# Patient Record
Sex: Female | Born: 1945
Health system: Southern US, Community
[De-identification: ages and names within clinical notes are randomized; demographics above are authoritative.]

## PROBLEM LIST (undated history)

## (undated) DIAGNOSIS — R110 Nausea: Secondary | ICD-10-CM

## (undated) DIAGNOSIS — F32A Depression, unspecified: Secondary | ICD-10-CM

## (undated) DIAGNOSIS — D649 Anemia, unspecified: Secondary | ICD-10-CM

## (undated) DIAGNOSIS — J40 Bronchitis, not specified as acute or chronic: Secondary | ICD-10-CM

## (undated) DIAGNOSIS — E119 Type 2 diabetes mellitus without complications: Secondary | ICD-10-CM

## (undated) DIAGNOSIS — K635 Polyp of colon: Secondary | ICD-10-CM

## (undated) DIAGNOSIS — IMO0001 Reserved for inherently not codable concepts without codable children: Secondary | ICD-10-CM

## (undated) DIAGNOSIS — I2699 Other pulmonary embolism without acute cor pulmonale: Secondary | ICD-10-CM

## (undated) DIAGNOSIS — F329 Major depressive disorder, single episode, unspecified: Secondary | ICD-10-CM

## (undated) DIAGNOSIS — D689 Coagulation defect, unspecified: Secondary | ICD-10-CM

## (undated) DIAGNOSIS — M25561 Pain in right knee: Secondary | ICD-10-CM

## (undated) DIAGNOSIS — F419 Anxiety disorder, unspecified: Secondary | ICD-10-CM

## (undated) DIAGNOSIS — N200 Calculus of kidney: Secondary | ICD-10-CM

## (undated) DIAGNOSIS — J383 Other diseases of vocal cords: Secondary | ICD-10-CM

## (undated) DIAGNOSIS — G4730 Sleep apnea, unspecified: Secondary | ICD-10-CM

## (undated) DIAGNOSIS — G43909 Migraine, unspecified, not intractable, without status migrainosus: Secondary | ICD-10-CM

## (undated) DIAGNOSIS — R102 Pelvic and perineal pain: Secondary | ICD-10-CM

## (undated) DIAGNOSIS — E0590 Thyrotoxicosis, unspecified without thyrotoxic crisis or storm: Secondary | ICD-10-CM

## (undated) DIAGNOSIS — M1990 Unspecified osteoarthritis, unspecified site: Secondary | ICD-10-CM

## (undated) DIAGNOSIS — G709 Myoneural disorder, unspecified: Secondary | ICD-10-CM

## (undated) DIAGNOSIS — K219 Gastro-esophageal reflux disease without esophagitis: Secondary | ICD-10-CM

## (undated) DIAGNOSIS — E0500 Thyrotoxicosis with diffuse goiter without thyrotoxic crisis or storm: Secondary | ICD-10-CM

## (undated) HISTORY — PX: OTHER SURGICAL HISTORY: SHX169

## (undated) HISTORY — PX: APPENDECTOMY: SHX54

## (undated) HISTORY — PX: BOTOX INJECTION: SHX5754

## (undated) HISTORY — DX: Coagulation defect, unspecified: D68.9

## (undated) HISTORY — DX: Anemia, unspecified: D64.9

## (undated) HISTORY — PX: KIDNEY STONE SURGERY: SHX686

## (undated) HISTORY — PX: CHOLECYSTECTOMY: SHX55

## (undated) HISTORY — PX: ABDOMINAL HYSTERECTOMY: SHX81

## (undated) HISTORY — DX: Myoneural disorder, unspecified: G70.9

---

## 1968-09-17 HISTORY — PX: AUGMENTATION MAMMAPLASTY: SUR837

## 1996-09-17 HISTORY — PX: KNEE ARTHROSCOPY: SUR90

## 1997-12-22 ENCOUNTER — Inpatient Hospital Stay (HOSPITAL_COMMUNITY): Admission: AD | Admit: 1997-12-22 | Discharge: 1997-12-24 | Payer: Self-pay | Admitting: Family Medicine

## 1999-08-15 ENCOUNTER — Encounter: Admission: RE | Admit: 1999-08-15 | Discharge: 1999-11-13 | Payer: Self-pay | Admitting: Orthopedic Surgery

## 1999-12-25 ENCOUNTER — Encounter: Payer: Self-pay | Admitting: Family Medicine

## 1999-12-25 ENCOUNTER — Encounter: Admission: RE | Admit: 1999-12-25 | Discharge: 1999-12-25 | Payer: Self-pay | Admitting: Family Medicine

## 1999-12-27 ENCOUNTER — Encounter: Admission: RE | Admit: 1999-12-27 | Discharge: 1999-12-27 | Payer: Self-pay | Admitting: Family Medicine

## 1999-12-27 ENCOUNTER — Encounter: Payer: Self-pay | Admitting: Family Medicine

## 2000-02-22 ENCOUNTER — Encounter: Payer: Self-pay | Admitting: Neurology

## 2000-02-22 ENCOUNTER — Encounter: Admission: RE | Admit: 2000-02-22 | Discharge: 2000-02-22 | Payer: Self-pay | Admitting: Neurology

## 2001-06-30 ENCOUNTER — Inpatient Hospital Stay (HOSPITAL_COMMUNITY): Admission: EM | Admit: 2001-06-30 | Discharge: 2001-07-03 | Payer: Self-pay | Admitting: Emergency Medicine

## 2001-07-02 ENCOUNTER — Encounter: Payer: Self-pay | Admitting: Internal Medicine

## 2001-07-03 ENCOUNTER — Encounter: Payer: Self-pay | Admitting: Internal Medicine

## 2001-07-23 ENCOUNTER — Encounter: Admission: RE | Admit: 2001-07-23 | Discharge: 2001-07-23 | Payer: Self-pay | Admitting: Orthopedic Surgery

## 2001-07-23 ENCOUNTER — Encounter: Payer: Self-pay | Admitting: Orthopedic Surgery

## 2001-08-06 ENCOUNTER — Encounter: Admission: RE | Admit: 2001-08-06 | Discharge: 2001-08-06 | Payer: Self-pay | Admitting: Orthopedic Surgery

## 2001-08-06 ENCOUNTER — Encounter: Payer: Self-pay | Admitting: Orthopedic Surgery

## 2001-08-29 ENCOUNTER — Encounter: Payer: Self-pay | Admitting: Orthopedic Surgery

## 2001-08-29 ENCOUNTER — Encounter: Admission: RE | Admit: 2001-08-29 | Discharge: 2001-08-29 | Payer: Self-pay | Admitting: Orthopedic Surgery

## 2002-02-11 ENCOUNTER — Encounter: Payer: Self-pay | Admitting: Family Medicine

## 2002-02-11 ENCOUNTER — Encounter: Admission: RE | Admit: 2002-02-11 | Discharge: 2002-02-11 | Payer: Self-pay | Admitting: Family Medicine

## 2002-02-25 ENCOUNTER — Encounter: Payer: Self-pay | Admitting: Emergency Medicine

## 2002-02-25 ENCOUNTER — Emergency Department (HOSPITAL_COMMUNITY): Admission: EM | Admit: 2002-02-25 | Discharge: 2002-02-26 | Payer: Self-pay | Admitting: Emergency Medicine

## 2002-05-07 ENCOUNTER — Encounter: Payer: Self-pay | Admitting: Gastroenterology

## 2002-05-07 ENCOUNTER — Encounter: Admission: RE | Admit: 2002-05-07 | Discharge: 2002-05-07 | Payer: Self-pay | Admitting: Gastroenterology

## 2002-05-13 ENCOUNTER — Ambulatory Visit (HOSPITAL_COMMUNITY): Admission: RE | Admit: 2002-05-13 | Discharge: 2002-05-13 | Payer: Self-pay | Admitting: Gastroenterology

## 2002-06-11 ENCOUNTER — Encounter: Payer: Self-pay | Admitting: Orthopedic Surgery

## 2002-06-11 ENCOUNTER — Encounter: Admission: RE | Admit: 2002-06-11 | Discharge: 2002-06-11 | Payer: Self-pay | Admitting: Orthopedic Surgery

## 2002-06-25 ENCOUNTER — Encounter: Payer: Self-pay | Admitting: Orthopedic Surgery

## 2002-06-25 ENCOUNTER — Encounter: Admission: RE | Admit: 2002-06-25 | Discharge: 2002-06-25 | Payer: Self-pay | Admitting: Orthopedic Surgery

## 2002-06-29 ENCOUNTER — Encounter: Payer: Self-pay | Admitting: Surgery

## 2002-06-29 ENCOUNTER — Encounter (INDEPENDENT_AMBULATORY_CARE_PROVIDER_SITE_OTHER): Payer: Self-pay | Admitting: *Deleted

## 2002-06-29 ENCOUNTER — Inpatient Hospital Stay (HOSPITAL_COMMUNITY): Admission: RE | Admit: 2002-06-29 | Discharge: 2002-07-04 | Payer: Self-pay | Admitting: Surgery

## 2002-07-02 ENCOUNTER — Encounter: Payer: Self-pay | Admitting: Surgery

## 2002-07-03 ENCOUNTER — Encounter: Payer: Self-pay | Admitting: Thoracic Surgery

## 2002-07-09 ENCOUNTER — Encounter: Admission: RE | Admit: 2002-07-09 | Discharge: 2002-07-09 | Payer: Self-pay | Admitting: Orthopedic Surgery

## 2002-07-09 ENCOUNTER — Encounter: Payer: Self-pay | Admitting: Orthopedic Surgery

## 2002-07-10 ENCOUNTER — Encounter: Payer: Self-pay | Admitting: Thoracic Surgery

## 2002-07-10 ENCOUNTER — Encounter: Admission: RE | Admit: 2002-07-10 | Discharge: 2002-07-10 | Payer: Self-pay | Admitting: Thoracic Surgery

## 2002-09-17 HISTORY — PX: HAND SURGERY: SHX662

## 2003-04-08 ENCOUNTER — Ambulatory Visit (HOSPITAL_BASED_OUTPATIENT_CLINIC_OR_DEPARTMENT_OTHER): Admission: RE | Admit: 2003-04-08 | Discharge: 2003-04-08 | Payer: Self-pay | Admitting: Orthopedic Surgery

## 2003-09-16 ENCOUNTER — Encounter: Admission: RE | Admit: 2003-09-16 | Discharge: 2003-09-16 | Payer: Self-pay | Admitting: Orthopedic Surgery

## 2003-09-29 ENCOUNTER — Encounter: Admission: RE | Admit: 2003-09-29 | Discharge: 2003-09-29 | Payer: Self-pay | Admitting: Orthopedic Surgery

## 2003-10-21 ENCOUNTER — Encounter: Admission: RE | Admit: 2003-10-21 | Discharge: 2003-10-21 | Payer: Self-pay | Admitting: Orthopedic Surgery

## 2006-12-13 ENCOUNTER — Ambulatory Visit (HOSPITAL_COMMUNITY): Admission: RE | Admit: 2006-12-13 | Discharge: 2006-12-13 | Payer: Self-pay | Admitting: Family Medicine

## 2006-12-14 ENCOUNTER — Emergency Department (HOSPITAL_COMMUNITY): Admission: EM | Admit: 2006-12-14 | Discharge: 2006-12-14 | Payer: Self-pay | Admitting: Emergency Medicine

## 2006-12-16 ENCOUNTER — Inpatient Hospital Stay (HOSPITAL_COMMUNITY): Admission: RE | Admit: 2006-12-16 | Discharge: 2006-12-19 | Payer: Self-pay | Admitting: Urology

## 2007-01-30 ENCOUNTER — Inpatient Hospital Stay (HOSPITAL_COMMUNITY): Admission: EM | Admit: 2007-01-30 | Discharge: 2007-02-03 | Payer: Self-pay | Admitting: Emergency Medicine

## 2007-06-05 ENCOUNTER — Emergency Department (HOSPITAL_COMMUNITY): Admission: EM | Admit: 2007-06-05 | Discharge: 2007-06-05 | Payer: Self-pay | Admitting: Emergency Medicine

## 2010-04-06 ENCOUNTER — Encounter: Payer: Self-pay | Admitting: Internal Medicine

## 2010-04-10 ENCOUNTER — Encounter: Payer: Self-pay | Admitting: Internal Medicine

## 2010-04-19 ENCOUNTER — Encounter: Admission: RE | Admit: 2010-04-19 | Discharge: 2010-04-19 | Payer: Self-pay | Admitting: Internal Medicine

## 2010-04-27 DIAGNOSIS — G589 Mononeuropathy, unspecified: Secondary | ICD-10-CM | POA: Insufficient documentation

## 2010-04-27 DIAGNOSIS — E119 Type 2 diabetes mellitus without complications: Secondary | ICD-10-CM | POA: Insufficient documentation

## 2010-04-27 DIAGNOSIS — D649 Anemia, unspecified: Secondary | ICD-10-CM | POA: Insufficient documentation

## 2010-04-27 DIAGNOSIS — K8020 Calculus of gallbladder without cholecystitis without obstruction: Secondary | ICD-10-CM | POA: Insufficient documentation

## 2010-04-27 DIAGNOSIS — K649 Unspecified hemorrhoids: Secondary | ICD-10-CM | POA: Insufficient documentation

## 2010-04-27 DIAGNOSIS — E66813 Obesity, class 3: Secondary | ICD-10-CM

## 2010-04-27 DIAGNOSIS — G43709 Chronic migraine without aura, not intractable, without status migrainosus: Secondary | ICD-10-CM | POA: Insufficient documentation

## 2010-04-27 DIAGNOSIS — E1110 Type 2 diabetes mellitus with ketoacidosis without coma: Secondary | ICD-10-CM

## 2010-04-27 DIAGNOSIS — F341 Dysthymic disorder: Secondary | ICD-10-CM | POA: Insufficient documentation

## 2010-04-27 DIAGNOSIS — K5730 Diverticulosis of large intestine without perforation or abscess without bleeding: Secondary | ICD-10-CM | POA: Insufficient documentation

## 2010-04-27 DIAGNOSIS — K219 Gastro-esophageal reflux disease without esophagitis: Secondary | ICD-10-CM | POA: Insufficient documentation

## 2010-04-27 DIAGNOSIS — IMO0002 Reserved for concepts with insufficient information to code with codable children: Secondary | ICD-10-CM | POA: Insufficient documentation

## 2010-04-27 DIAGNOSIS — N200 Calculus of kidney: Secondary | ICD-10-CM | POA: Insufficient documentation

## 2010-04-27 DIAGNOSIS — F329 Major depressive disorder, single episode, unspecified: Secondary | ICD-10-CM | POA: Insufficient documentation

## 2010-04-27 DIAGNOSIS — K589 Irritable bowel syndrome without diarrhea: Secondary | ICD-10-CM | POA: Insufficient documentation

## 2010-04-27 HISTORY — DX: Morbid (severe) obesity due to excess calories: E66.01

## 2010-04-27 HISTORY — DX: Obesity, class 3: E66.813

## 2010-04-28 ENCOUNTER — Ambulatory Visit: Payer: Self-pay | Admitting: Internal Medicine

## 2010-04-28 DIAGNOSIS — R61 Generalized hyperhidrosis: Secondary | ICD-10-CM | POA: Insufficient documentation

## 2010-04-28 DIAGNOSIS — R0602 Shortness of breath: Secondary | ICD-10-CM | POA: Insufficient documentation

## 2010-04-28 DIAGNOSIS — R0902 Hypoxemia: Secondary | ICD-10-CM | POA: Insufficient documentation

## 2010-05-04 ENCOUNTER — Encounter: Payer: Self-pay | Admitting: Internal Medicine

## 2010-05-18 ENCOUNTER — Encounter: Payer: Self-pay | Admitting: Internal Medicine

## 2010-08-15 ENCOUNTER — Encounter: Admission: RE | Admit: 2010-08-15 | Discharge: 2010-08-15 | Payer: Self-pay | Admitting: Internal Medicine

## 2010-10-08 ENCOUNTER — Encounter: Payer: Self-pay | Admitting: Internal Medicine

## 2010-10-17 NOTE — Letter (Signed)
Summary: [facility]  [facility]   Imported By: Lester Colfax 05/26/2010 08:31:35  _____________________________________________________________________  External Attachment:    Type:   Image     Comment:   External Document

## 2010-10-17 NOTE — Procedures (Signed)
Summary: Colonoscopy / [facility].  Colonoscopy / [facility].   Imported By: Lennie Odor 04/24/2010 14:32:31  _____________________________________________________________________  External Attachment:    Type:   Image     Comment:   External Document

## 2010-10-17 NOTE — Letter (Signed)
Summary: [facility]  [facility]   Imported By: Lester Walton 05/04/2010 09:59:34  _____________________________________________________________________  External Attachment:    Type:   Image     Comment:   External Document

## 2010-10-17 NOTE — Procedures (Signed)
Summary: EGD / [facility]  EGD / [facility]   Imported By: Lennie Odor 05/03/2010 11:33:04  _____________________________________________________________________  External Attachment:    Type:   Image     Comment:   External Document

## 2010-10-17 NOTE — Assessment & Plan Note (Signed)
Summary: SATS 86 ON RA AT COLONOSCOPY//KP   Visit Type:  Initial Consult Copy to:  Dr Charna Elizabeth Primary Provider/Referring Provider:  DR Sonia Side MD  CC:  pulomnary consult, Pt states she has diffuclty breathing and her o2 sats would not stay above 85, Productive cough with green phlem, pt states she currently has uper respitory infection, and .  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 REferred by Dr Loreta Ave. Per patient pulse ox was low pre-endoscopy and colonoscopy on 04/19/2010. Nevertheless was still able to undergo moderate sedation procedure with O2. Same thing recurred when she went for back pain injections on 04/21/2010. She herself feels at baseline but recognizes she has dyspnea on exertion for over a year. She gets dyspneic walking around 300 feet. Dyspnea is probably stable since onset. Dyspnea also made worse by summer heat and humidity. Dyspnea is associated wheezing esp at night and is also related to weather changes or dust. Dyspnea and wheeze is relieved by oxygen, inhalers. Dspnea is also associated with periodic cough that varies with activity level and weather. OVerall she feels symptoms are rated as moderate - severe.   OF note, past 4 weeks she has had green sputum; currently on antibiotics for the same   Preventive Screening-Counseling & Management  Alcohol-Tobacco     Smoking Status: quit     Smoking Cessation Counseling: no     Smoke Cessation Stage: quit     Packs/Day: .5     Year Started: 1968     Year Quit: 1980     Pack years: 11     Passive Smoke Exposure: yes     Passive Smoke Counseling: not indicated; no passive smoke exposure  Comments: father smoked cigars and had copd. EXposed to father's cigars during teen years.   Allergies: 1)  ! Penicillin 2)  ! Darvocet 3)  ! Morphine 4)  ! Lodine  Past History:  Past Medical History: Last  updated: 04/27/2010 Current Problems:  GERD (ICD-530.81) IBS (ICD-564.1) OBESITY (ICD-278.00) NEUROPATHY (ICD-355.9) DEPRESSION (ICD-311) ANEMIA (ICD-285.9) CHOLELITHIASIS (ICD-574.20) DIABETES MELLITUS, TYPE II (ICD-250.00) MIGRAINE HEADACHE (ICD-346.90) RENAL CALCULUS (ICD-592.0) HEMORRHOIDS (ICD-455.6) DIVERTICULOSIS, COLON (ICD-562.10) ANXIETY DEPRESSION (ICD-300.4)      Past Surgical History: Last updated: 04/27/2010 kidney stones removed total vaginal hysterectomy laproscopic cholecystectomy zeuker diverticulectomy R knee surgery B hand surgeries wrist fractures  Risk Factors: Smoking Status: quit (04/28/2010) Packs/Day: .5 (04/28/2010) Passive Smoke Exposure: yes (04/28/2010)  Family History: father: deceased cirrhosis heart disease and COPD/emphysema sister: deceased sepsis mother: deceased MI dementia paternal grandfather: deceased heart disease paternal grandmother: deceased heart disease maternal grandfather: deceased heart disease maternal grandmother: deceased heart disease  Social History: former smoker. Quit in 1980. Started in 1968. 1/2ppd Married liveds with husband 5 children Occupation-- retired-- Special educational needs teacher Status:  quit Packs/Day:  .5 Pack years:  11 Passive Smoke Exposure:  yes  Review of Systems       The patient complains of shortness of breath with activity, shortness of breath at rest, productive cough, acid heartburn, loss of appetite, weight change, and abdominal pain.  The patient denies non-productive cough, coughing up blood, chest pain, irregular heartbeats, indigestion, difficulty swallowing, sore throat, tooth/dental problems, headaches, nasal congestion/difficulty breathing through nose, sneezing, itching, ear ache, anxiety, depression, hand/feet swelling, joint stiffness or pain, rash, change in color of mucus, and fever.    Vital Signs:  Patient profile:  65 year old female Height:      65 inches Weight:       239.8 pounds BMI:     40.05 O2 Sat:      90 % on Room air Temp:     98.4 degrees F oral Pulse rate:   98 / minute BP sitting:   118 / 82  (right arm) Cuff size:   regular  Vitals Entered By: Carver Fila (April 28, 2010 2:00 PM)  O2 Flow:  Room air  Serial Vital Signs/Assessments:  Comments: Ambulatory Pulse Oximetry  Resting; HR_90____    02 Sat_88%RA____  Lap1 (185 feet)   HR_109____   02 Sat__88%RA___ Lap2 (185 feet)   HR_114____   02 Sat__84%RA___    Lap3 (185 feet)   HR_____   02 Sat_____  ___Test Completed without Difficulty _x__Test Stopped due to: PT DESAT ON THE SECOND LAP TO 84% RA and heart rate 114, THEN TOOK PT TO ROOM AND PUT ON 2 LITERS OF OXYGEN AND WENT UP TO 92% WITH HEART RATE 108 with 1 minute Mindy Silva  April 28, 2010 2:55 PM     By: Carver Fila   CC: pulomnary consult, Pt states she has diffuclty breathing and her o2 sats would not stay above 85, Productive cough with green phlem, pt states she currently has uper respitory infection,  Is Patient Diabetic? No Comments meds and alergies updated Phone number updated Carver Fila  April 28, 2010 3:04 PM    Physical Exam  General:  obese.   Head:  normocephalic and atraumatic Eyes:  PERRLA/EOM intact; conjunctiva and sclera clear Ears:  TMs intact and clear with normal canals Nose:  no deformity, discharge, inflammation, or lesions Mouth:  no deformity or lesions Neck:  no masses, thyromegaly, or abnormal cervical nodes  scar in left neck from prior Zencker diveritculum surgery Chest Wall:  no deformities noted Lungs:  clear bilaterally to auscultation and percussion Heart:  regular rate and rhythm, S1, S2 without murmurs, rubs, gallops, or clicks Abdomen:  bowel sounds positive; abdomen soft and non-tender without masses, or organomegaly Msk:  no deformity or scoliosis noted with normal posture Pulses:  pulses normal Extremities:  no clubbing, cyanosis, edema, or deformity  noted Neurologic:  CN II-XII grossly intact with normal reflexes, coordination, muscle strength and tone Skin:  intact without lesions or rashes Cervical Nodes:  no significant adenopathy Axillary Nodes:  no significant adenopathy Psych:  alert and cooperative; normal mood and affect; normal attention span and concentration   CXR  Procedure date:  12/18/2006  Findings:         Clinical Data:   Productive cough and green sputum, evaluate for   pneumonia.   CHEST - 2 VIEW:   Comparison:  None.   Findings:  The heart size is normal.  No effusions or edema.  No   airspace opacities noted.   IMPRESSION:   No active disease.     Read By:  Rosealee Albee,  M.D.   Released By:  Rosealee Albee,  M.D.   Comments:      independently reviewed  CT of Abdomen  Procedure date:  01/30/2007  Findings:      lung cuts of this ct abdomen does not show evidence of emphysema  Impression & Recommendations:  Problem # 1:  DYSPNEA (ICD-786.05) Assessment New Unclear cause. Dyspnea is related to exrtional hypoxemia. DDx includes COPD (fam hx + but only limited smoke exposure), pulm htn.  PLAN Full  PFTs and then decide next step  Orders: Pulmonary Referral (Pulmonary) Consultation Level IV (16109) DME Referral (DME) Pulmonary Referral (Pulmonary)  Problem # 2:  HYPOXEMIA (ICD-799.02) Assessment: New she has exertional hypoxemia. Unclear cause. She needs O2. I explained this to her. She was shocked and in denial. Explained rationale from improved quality of life, syncope, and possible mortalit benefit too. She is willing to accept it Orders: Pulmonary Referral (Pulmonary) Consultation Level IV (60454) DME Referral (DME) Pulmonary Referral (Pulmonary)  Problem # 3:  DIAPHORESIS (ICD-780.8) Assessment: New as she was ready to leave office, she c/o diaphoresis x 1 day following dm and bp med change by pmd. WE checked fingerstickl and it was 104mg %. I have asked her to address  this wiht her pmd  Medications Added to Medication List This Visit: 1)  Cymbalta 30 Mg Cpep (Duloxetine hcl) .... One tab twice daily 2)  Tramadol Hcl 50 Mg Tabs (Tramadol hcl) .... As needed q 4 hrs 3)  Neurontin 300 Mg Caps (Gabapentin) .... 2 in the am 4 in pm 4)  Clonazepam 2 Mg Tabs (Clonazepam) .... One tab once a day 5)  Clonazepam 0.5 Mg Tabs (Clonazepam) .[name].. 1 tablet at bedtime 6)  Metformin Hcl 500 Mg Tabs (Metformin hcl) .... Once at bedtime 7)  Chromium Picolinate 200 Mcg Tabs (Chromium picolinate) .... 5 caps in the morning 8)  Omeprazole 20 Mg Cpdr (Omeprazole) .[name].. 1-2 per day 9)  Ultra Flora Plus Df Caps Probiotic  .[name].. 1 at bedtime 10)  Integra F 125-1 Mg Caps (Fe fum-fepoly-fa-vit c-vit b3) .... 3 times a day 11)  Ferrous Sulfate 324 Mg Tbec (Ferrous sulfate) .[name].. 1 tab 3 times a day 12)  Tessalon Perles 100 Mg Caps (Benzonatate) .... Every 8 hrs as needed 13)  Imitrex 100 Mg Tabs (Sumatriptan succinate) .... As needed 14)  Treximet 85-500 Mg Tabs (Sumatriptan-naproxen sodium) .... Max 2 tabs per day 15)  Promethazine Hcl 25 Mg Tabs (Promethazine hcl) .[name].. 1 tablet every 4-6 hrs  Patient Instructions: 1)  please wear oxygen with sleep and exertion 2)  have full PFT breathing test 3)  I will review that result and call you with next step 4)  Please tallk to Dr. Ricki Miller your primary care doctor about your sweating issues   Immunization History:  Pneumovax Immunization History:    Pneumovax:  historical (04/10/2010)

## 2010-10-17 NOTE — Letter (Signed)
Summary: CMN for Oxygen / [facility]  CMN for [facility]xygen / [facility]   Imported By: Lennie Odor 06/08/2010 11:25:17  _____________________________________________________________________  External Attachment:    Type:   Image     Comment:   External Document

## 2011-01-17 ENCOUNTER — Other Ambulatory Visit: Payer: Self-pay | Admitting: Internal Medicine

## 2011-01-17 DIAGNOSIS — E041 Nontoxic single thyroid nodule: Secondary | ICD-10-CM

## 2011-01-18 ENCOUNTER — Ambulatory Visit
Admission: RE | Admit: 2011-01-18 | Discharge: 2011-01-18 | Disposition: A | Discharge status: Home / Self Care | Payer: Medicare Other | Source: Ambulatory Visit | Attending: Internal Medicine | Admitting: Internal Medicine

## 2011-01-18 DIAGNOSIS — E041 Nontoxic single thyroid nodule: Secondary | ICD-10-CM

## 2011-01-30 NOTE — Discharge Summary (Signed)
Hayley [name], Hayley [name]                  ACCOUNT NO.:  [id number]   MEDICAL RECORD NO.:  [id number]          PATIENT TYPE:  INP   LOCATION:  1433                         FACILITY:  [facility]   PHYSICIAN:  Mobolaji B. Corky Downs, M.D.DATE OF BIRTH:  Aug 27, 1946   DATE OF ADMISSION:  01/30/2007  DATE OF DISCHARGE:                               DISCHARGE SUMMARY   PRIMARY CARE PHYSICIAN:  Dr. Evelena Peat   GASTROENTEROLOGIST:  Dr. Anselmo Rod   FINAL DIAGNOSIS:  Acute sigmoid diverticulitis.   SECONDARY DIAGNOSIS:  Migraine headaches.   PROCEDURES:  CT abdomen and pelvis, findings most compatible with distal  sigmoid diverticulitis.   BRIEF HISTORY:  Hayley [name] is a 65 year old Caucasian female who presented  to the emergency room with abdominal pain that started suddenly in the  left lower quadrant a day prior to hospitalization, associated with  nausea and multiple vomiting.  Pain was crampy in nature and rated as  7/10.  She had no fever or chills.  Initial laboratory data revealed  white cell count of 10.1.  CT scan of the abdomen and pelvis was  compatible with distal sigmoid diverticulitis without abscess.  Of note  is that the patient had diverticulitis over 10 years ago.   HOSPITAL COURSE:  The patient was kept on clear liquids.  She was  started on IV Cipro and Flagyl.  She had low-grade fever of 99.1 on  admission.  White cell count improved during the course of  hospitalization.  The patient's pain also improved.  Her diet was  advanced and she was tolerating her diet.  Overall, the patient has  improved and was deemed suitable for discharge today.   VITALS SIGNS UPON DISCHARGE:  Temperature 98.5, pulse of 75, blood  pressure 105/68, O2 saturations of 92% on room air.  She had no  abdominal tenderness.   DISCHARGE MEDICATIONS:  1. Ciprofloxacin 500 mg p.o. b.i.d. for six more days.  2. Flagyl 500 mg t.i.d. for six more days.  3. Vicodin one to two p.o. q.4h. p.r.n.  4.  Effexor 150 mg twice a day.  5. Neurontin 600 mg daily.   FOLLOWUP:  1. With Dr. Caryl Never in 1-2 weeks.  2. With Dr. Charna Elizabeth in 3-4 weeks.   DISCHARGE LABORATORY DATA:  Sodium 137, potassium 3.7, chloride 107,  bicarb 27, glucose 106, BUN 3, creatinine 0.58, calcium 8.3.  White  cells 4.1, hemoglobin 12.4, hematocrit 36.8, platelets 255.      Mobolaji B. Corky Downs, M.D.  Electronically Signed     MBB/MEDQ  D:  02/03/2007  T:  02/03/2007  Job:  [id number]   cc:   Evelena Peat, M.D.   Anselmo Rod, M.D.  Fax: [phone]

## 2011-01-30 NOTE — H&P (Signed)
Hayley [name], Hayley [name]                  ACCOUNT NO.:  [id number]   MEDICAL RECORD NO.:  [id number]          PATIENT TYPE:  EMS   LOCATION:  ED                           FACILITY:  [facility]   PHYSICIAN:  Madaline Savage, MD        DATE OF BIRTH:  02/25/1946   DATE OF ADMISSION:  01/30/2007  DATE OF DISCHARGE:                              HISTORY & PHYSICAL   PRIMARY CARE PHYSICIAN:  Evelena Peat, M.D.   PRIMARY GASTROENTEROLOGIST:  Anselmo Rod, M.D.   CHIEF COMPLAINT:  Abdominal pain.   HISTORY OF PRESENT ILLNESS:  Ms. Hayley [name] is a 65 year old Caucasian lady  with a history of diverticulitis approximately 10 years ago who comes in  with abdominal pain in the left lower quadrant for 1 day.  She says it  came on suddenly with cramping, nausea, and she vomited multiple times.  Her pain is constant in nature.  She describes it like a cramping pain.  Right now her pain is 7/10 but she says the pain goes up to 9/10.  The  pain does not radiate.  She does complain of feeling feverish and  chilly.  She also states that she has been passing blood-tinged mucous.  She also now complains of headache and she has a history of migraine  headaches.   PAST MEDICAL HISTORY:  1. Depression.  2. Migraine headaches.  3. Diverticulitis approximately 10 years ago.   PAST SURGICAL HISTORY:  1. Neck surgery in the past.  2. Right knee surgery.   ALLERGIES:  MORPHINE, DARVOCET, PENICILLIN, LODINE.   CURRENT MEDICATIONS:  1. Effexor XR 150 mg twice daily.  2. Imitrex 25 mg as needed.   SOCIAL HISTORY:  She denies any history of smoking, alcohol, or drug  abuse.   FAMILY HISTORY:  Her father passed away at the age of 23 of heart  problems and mother passed away at the age of 81 of stroke.  She also  had coronary artery disease.   REVIEW OF SYSTEMS:  GENERAL:  She denies any recent weight loss or  weight gain.  She does complain of fever and chills.  HEENT:  No  headaches, no blurred vision, and no  sore throat.  CARDIOVASCULAR:  She  denies chest pain, palpitations.  RESPIRATORY:  No shortness of breath,  cough.  GASTROINTESTINAL:  She does complain of nausea, vomiting, and  abdominal pain.   PHYSICAL EXAMINATION:  GENERAL:  She is alert and oriented.  VITAL SIGNS:  Temperature is 99.1, pulse rate 96, blood pressure 100/67,  respiratory rate 20, oxygen saturation of 93% on room air.  HEENT:  Normocephalic and atraumatic.  Pupils equal, round, and reactive  to light.  NECK:  Supple, no JVD and no carotid bruit.  HEART:  S1 and S2 heard, regular rate and rhythm.  No murmurs, rubs, or  gallops.  CHEST:  Clear to auscultation.  ABDOMEN:  Soft, bowel sounds heard. She does have tenderness in the left  lower quadrant.  There is no rebound tenderness.  EXTREMITIES:  No cyanosis, clubbing, or edema.  LABORATORY DATA:  White count 10.1, hemoglobin 13.8, hematocrit 41.2,  platelets 312.  Sodium 141, potassium 3.6, creatinine 0.65.  Her liver  enzymes are within normal limits.  Lipase is 19.  Urinalysis is  negative.   She had a CT scan of the abdomen which showed distal sigmoid  diverticulitis and no abscess.   IMPRESSION:  1. Acute sigmoid diverticulitis.  2. Migraine headaches.  3. Depression.   PLAN:  This is a 65 year old lady who has a history of diverticuli in  the past and a history of diverticuli approximately 10 years ago who  comes in with acute sigmoid diverticulitis.  She has been passing blood  and mucous for the last 1 day.  She will need to be on antibiotics.  At  this time she has nausea and cannot keep anything down.  We will admit  her for IV antibiotics, will start her on Cipro and Flagyl.  We can  consult her primary gastroenterologist if needed.  At this time, she is  also complaining of headache which is a migraine headache.  I will give  her Imitrex at this time.  I will put her on DVT and GI prophylaxis.      Madaline Savage, MD  Electronically  Signed     PKN/MEDQ  D:  01/30/2007  T:  01/30/2007  Job:  [id number]

## 2011-02-02 NOTE — Discharge Summary (Signed)
Hayley [name], Hayley [name]                  ACCOUNT NO.:  [id number]   MEDICAL RECORD NO.:  [id number]          PATIENT TYPE:  INP   LOCATION:  1444                         FACILITY:  [facility]   PHYSICIAN:  Maretta Bees. Vonita Moss, M.D.DATE OF BIRTH:  March 09, 1946   DATE OF ADMISSION:  12/16/2006  DATE OF DISCHARGE:  12/19/2006                               DISCHARGE SUMMARY   FINAL DIAGNOSES:  1. Distal right ureteral calculus.  2. Urinary tract infection.  3. Depression.  4. Arthritis and chronic back pain.  5. Cough and chest congestion.  6. Constipation.  7. Migraine headaches.   HISTORY:  This 65 year old lady presented to my office on December 16, 2006  with 3-day history of severe right flank pain and had been in the  emergency room and was found to have a 4.3-mm stone in the distal right  ureter.  She also was found to have a urinary tract infection and was on  Macrobid.  She requested removal of the stone, which was done at the  [facility]; however, afterwards, the patient felt she  could not manage at home the way she felt and with her husband's many  medical problems and his inability to care for her.  Also, it was  appropriate to keep her in view of her UTI.  At the time of surgery, she  had a double-J catheter placed.   PHYSICAL EXAMINATION:  Physical examination revealed a heavy-set white  female initially with severe distress from flank pain; however, this was  greatly relieved after her cystoscopy and stone removal and double-J  catheter placement.   HOSPITAL COURSE:  Her hospital stay was prolonged by complaints of  grossly bloody urine, frequent urination, chest congestion, fatigue and  constipation.  During the course of her hospital stay, she had her  double-J catheter removed and her bloody urine cleared up and her  frequency improved.  She required enemas and laxatives to clear her  constipation.  She had unusual complaints of cough and chest congestion.  Chest x-ray on December 18, 2006 showed no evidence of pneumonia or  pulmonary congestion.  She was put on expectorants.  She was feeling  improved on the day of discharge.   DISCHARGE MEDICATIONS:  1. She will go home on her usual dose of Effexor 150 mg p.o. b.i.d.  2. She can use Ambien 10 mg at bedtime.  3. She will buy an over-the-counter expectorant.  4. She can finish up her Macrobid b.i.d.  5. She can use oxycodone p.r.n. pain and use her Imitrex as needed for      her migraine headaches.  6. She will return to see me in 2 weeks in the office.   CONDITION ON DISCHARGE:  She was sent home in improved condition.   DIET AND ACTIVITY:  She can resume diet and activity as tolerated.   DISCHARGE INSTRUCTIONS:  If she has persistent symptoms of cough or  other non-urologic problems, she was instructed to contact Dr. Evelena Peat or Dr. Anselmo Rod.      Maretta Bees.  Vonita Moss, M.D.  Electronically Signed     LJP/MEDQ  D:  12/19/2006  T:  12/19/2006  Job:  [id number]

## 2011-02-02 NOTE — Op Note (Signed)
   NAME:  Hayley [name], BOUTIN                            ACCOUNT NO.:  [id number]   MEDICAL RECORD NO.:  [id number]                   PATIENT TYPE:  AMB   LOCATION:  DSC                                  FACILITY:  [facility]   PHYSICIAN:  Rodney A. Chaney Malling, M.D.           DATE OF BIRTH:  1946/04/16   DATE OF PROCEDURE:  04/08/2003  DATE OF DISCHARGE:                                 OPERATIVE REPORT   PREOPERATIVE DIAGNOSIS:  Right carpal tunnel.   POSTOPERATIVE DIAGNOSIS:  Right carpal tunnel.   OPERATION:  Release of carpal tunnel, right wrist.   SURGEON:  Rodney A. Chaney Malling, M.D.   ANESTHESIA:  MAC.   PROCEDURE:  Patient placed on the operating table in supine position with a  pneumatic tourniquet about the right upper arm.  The right upper extremity  was prepped with Duraprep and draped out in the usual manner.  The area of  incision was infiltrated with 1% local Xylocaine.  The hand was wrapped out  with an Esmarch.  The tourniquet was elevated.  Using loupe magnification, a  lazy S incision starting at the midpalmar space and carried proximally to  the volar wrist crease.  Skin edges were retracted.  The fascia in the  forearm was opened and the median nerve was identified and isolated.  The  curved Mayo scissors were placed underneath the transverse carpal ligament  but above the median nerve to protect the median nerve.  Under direct vision  the transverse carpal ligament was released off the ulnar border of the  carpal canal out into the midpalmar space.  A complete decompression of the  nerve was achieved very nicely.  There was definite loss of the vascular  markings underneath the transverse ligament and some narrowing of the nerve  at this level.  No other space-occupying lesions were seen.  The synovial  sheaths to the tendon appeared normal.  The wound was then bathed in  Marcaine and the skin edges were closed with 4-0 nylon suture.  A large  bulky compressive dressing  applied.  The patient returned to the recovery  room in excellent condition.  Technically this procedure went extremely  well.   FOLLOW-UP:  1. See me in the office next week.  2. Wrist immobilizer.  3. Percocet for pain.                                               Rodney A. Chaney Malling, M.D.    RAM/MEDQ  D:  04/08/2003  T:  04/08/2003  Job:  [id number]

## 2011-02-02 NOTE — H&P (Signed)
NAME:  Hayley [name], Hayley [name]                            ACCOUNT NO.:  [id number]   MEDICAL RECORD NO.:  [id number]                   PATIENT TYPE:  OIB   LOCATION:  NA                                   FACILITY:  [facility]   PHYSICIAN:  Velora Heckler, M.D.                DATE OF BIRTH:  1945-09-23   DATE OF ADMISSION:  06/29/2002  DATE OF DISCHARGE:                                HISTORY & PHYSICAL   ATTENDING SURGEON:  Ines Bloomer, MD   PRIMARY CARE GIVER:  Evelena Peat, MD   CARDIOLOGIST:  Peter M. Swaziland, M.D.   OTHER PHYSICIANS:  1. Anselmo Rod, M.D.  2. Velora Heckler, M.D.  3. Orie Rout, M.D.   HISTORY OF PRESENT ILLNESS:  Ms. Allport is a 65 year old female with a  complaint of voice changes and persistent reflux.  Her work-up included a  barium swallow.  The study demonstrated a finding of a 3-4 cm Zenker's  diverticulum.  Possibly aspiration from this diverticulum is causing reflux.  She was also complaining of chronic right lower quadrant pain and nausea and  is set at this time for a concomitant laparoscopic cholecystectomy.   PAST MEDICAL HISTORY:  1. Gastroesophageal reflux disease.  2. Depression/anxiety.  3. Migraine headaches.  4. Degenerative joint disease.  5. Dystonia.  6. Very strong family history of atherosclerotic coronary artery disease.  7. She fell with fracture of the right knee in November 2000.   SURGERIES:  1. Status post partial vaginal hysterectomy in July 1998.  2. Status post right knee surgery, November 2000.  3. Status post left carpal tunnel release.  4. Status post Botox injections in the larynx.  5. Status post colonoscopy, revealing diverticula, August 2003.  6. Status post Cardiolite stress test which was apparently normal for     ischemic changes in July 2003.   MEDICATIONS:  1. Effexor 150 mg in the morning, 75 mg in the evening (this is her baseline     medication for migraine prophylaxis.  2. Nexium 40 mg q.d.  3.  Flexeril 10 mg as needed.  4. Imitrex 100 mg as needed for migraine (this is taken at migraine onset).  5. Bextra 20 mg as needed (this is taken also on migraine onset).  6. Ultram 50 mg 1-2 tabs p.o. q.4-6h. p.r.n. pain.  7. Talwin NX 50/0.5, 1 tab q.3-4h. (this is taken if the migraine headache     is intractable, and both Imitrex and Bextra have not proven to work).   ALLERGIES:  MORPHINE SULFATE causes hallucinations.  LODINE, PENICILLIN  which cause rash, and DARVOCET nausea.   The patient denies any prior history of diabetes mellitus, kidney disease,  asthma, chronic obstructive pulmonary disease, TIA, CVA.  She has had  episodes of syncope, one of which occasion to Cardiolite stress test work-  up.  She has not had  any symptoms of amaurosis fugax and although she has a  strong family history of coronary artery disease, she herself is free of  symptoms as well as findings.  She has no history of angina nor cardiac  dysrhythmias.  No prior history of myocardial infarction, no pulmonary  embolus, no deep venous thrombosis, no history of GI bleed.  She is not  short of breath with exertion.  She does not have paroxysmal nocturnal  dyspnea.   SOCIAL HISTORY:  She is married, has a supportive spouse.  She has five  children, all of whom are well.  She is currently not working, but she did  work in Engineering geologist.  She does not take alcoholic beverages, and she does not  smoke.  She quit in 1981 after smoking for only five years.   FAMILY HISTORY:  Significant for coronary artery disease in all four  grandparents, in the mother, as well as a sister who died at age 30 of a  myocardial infarction.  She has another sister who has diabetes/arthritis.   PHYSICAL EXAMINATION:  GENERAL:  This is an alert, oriented female, quite  personable and chatty and in no acute distress.  VITAL SIGNS:  Blood pressure 114/80 in the left arm; pulse is 100 and  regular, and respirations are 18 and even.  HEENT:   Pupils equal, round, reactive to light.  Extraocular movements are  intact.  The oropharynx is clear of lesions.  She does not wear dentures.  She does have history of vocal dystonia which has been treated with Botox  injections.  NECK:  Supple, no jugular venous distension.  No carotid bruits auscultated.  No thyromegaly.  LUNGS:  Clear to auscultation and percussion bilaterally.  HEART:  Regular rate and rhythm without murmur, rubs, or gallops.  ABDOMEN:  Obese, nontender, nondistended.  Bowel sounds are present  throughout.  She does not have any particular right upper or lower quadrant  pain on palpation.  EXTREMITIES:  Without cyanosis, clubbing, edema, ulcerations, and she has  palpable pedal pulses.  She has no symptoms of claudication.  NEUROLOGIC:  Grossly normal.  Gait is normal.  Grip strength is 5/5  bilaterally.   IMPRESSION:  1. Zenker's diverticulum, 3-4 cm.  2. Gastroesophageal reflux disease.  3. Cholecystitis.   PLAN:  1. Repair of diverticulum.  2. Cricopharyngeal myotomy and concomitant laparoscopic cholecystectomy to     be done June 29, 2002, in conjunction with Dr. Gerrit Friends.     Maple Mirza, P.A.                    Velora Heckler, M.D.   GM/MEDQ  D:  06/25/2002  T:  06/28/2002  Job:  [id number]

## 2011-02-02 NOTE — Op Note (Signed)
NAMEEPIFANIA, Hayley [name]                  ACCOUNT NO.:  [id number]   MEDICAL RECORD NO.:  [id number]          PATIENT TYPE:  AMB   LOCATION:  [facility]                         FACILITY:  [facility]   PHYSICIAN:  Maretta Bees. Vonita Moss, M.D.DATE OF BIRTH:  [birth year]   DATE OF PROCEDURE:  DATE OF DISCHARGE:                               OPERATIVE REPORT   PREOPERATIVE DIAGNOSIS:  Distal right ureteral calculus.   POSTOPERATIVE DIAGNOSIS:  Distal right ureteral calculus.   PROCEDURE:  1. Cystoscopy.  2. Right retrograde pyelogram with interpretation.  3. Right ureteroscopy and stone basketing and insertion of right      double-J catheter.   SURGEON:  Maretta Bees. Vonita Moss, MD.   ANESTHESIA:  General.   INDICATIONS:  This lady has had three to four days of severe right flank  pain and also is being treated for a UTI, and she presented to the  office today for evaluation.  She opted for a cysto and stone removal  today because of her significant symptoms and her underlying infection,  and the fact that she had a sister die of urinary tract sepsis.  She was  advised about the risks of procedure.   PROCEDURE:  The patient was brought to the operating room and placed in  the lithotomy position after the induction of satisfactory spinal  anesthesia.  The external genitalia were prepped and draped in the usual  fashion.  She was cystoscoped, and the bladder was unremarkable.  A  guidewire was placed up the right ureter under fluoroscopic control, and  I saw the stone next to the guidewire.  I then dilated the right  intramural ureter with the inner sheath of a ureteral access sheath.  I  then inserted a 6-French rigid ureteroscope atraumatically and retrieved  the stone with the Nitinol stone basket and gave the stone to the  patient's husband after the case was over.  There was no ureteral  trauma.   A right retrograde pyelogram was obtained using contrast injected  through an open-ended ureteral catheter  placed cystoscopically, and she  had mild fullness of the right renal pelvis, but no filling defects or  other abnormalities.   I then measured the ureteral length and inserted a 6-French, 26 cm,  double-J contour catheter that had a full coil in the kidney and a full  coil in the bladder, and the string was brought out per urethra and  taped to the thigh postoperatively.  She was taken to recovery room in  good condition having tolerated the procedure well with essentially no  blood loss, and she was in good condition.      Maretta Bees. Vonita Moss, M.D.  Electronically Signed     LJP/MEDQ  D:  12/16/2006  T:  12/16/2006  Job:  [id number]

## 2011-02-02 NOTE — Op Note (Signed)
NAME:  Hayley [name], Hayley [name]                            ACCOUNT NO.:  [id number]   MEDICAL RECORD NO.:  [id number]                   PATIENT TYPE:  OIB   LOCATION:  2871                                 FACILITY:  [facility]   PHYSICIAN:  Velora Heckler, M.D.                DATE OF BIRTH:  [birth year]   DATE OF PROCEDURE:  06/29/2002  DATE OF DISCHARGE:                                 OPERATIVE REPORT   PREOPERATIVE DIAGNOSIS:  Chronic cholecystitis.   POSTOPERATIVE DIAGNOSIS:  Chronic cholecystitis.   OPERATION/PROCEDURE:  Laparoscopic cholecystectomy with intraoperative  cholangiography.   ASSISTANT:  Gita Kudo, M.D.   ANESTHESIA:  General.   ESTIMATED BLOOD LOSS:  Minimal.   PREPARATION OF THE PATIENT:  Betadine.   COMPLICATIONS:  None.   INDICATIONS FOR PROCEDURE:  The patient is a 65 year old white female  presents at the request of Dr. Anselmo Rod for evaluation of right upper  quadrant abdominal pain and nausea.  This has been present for approximately  six months.  Extensive work up included gallbladder ultrasound.  The patient  underwent pH probe.  She underwent upper GI series.  She was identified with  a large Zenker's diverticulum of the cervical esophagus.  The patient  underwent a hepatobiliary scan.  The patient now comes to surgery for  cholecystectomy for treatment of biliary colic and right upper quadrant  abdominal pain and concurrent Zenker's diverticulectomy by Dr.  Dewayne Shorter.   DESCRIPTION OF PROCEDURE:  The procedure was done in operating room #7 at  the [facility].  The patient was brought to the  operating room and placed in a supine position on the operating room table.  Following administration of general anesthesia, the patient was prepped and  draped in the usual strict aseptic fashion.  After ascertaining that an  adequate level of anesthesia had been obtained, an infraumbilical incision  was made with a #15 blade.   Dissection was carried down through the  subcutaneous tissues.  The fascia was incised in the midline.  The  peritoneal cavity was entered cautiously.  An 0 Vicryl pursestring suture  was placed in the fascia.  A Hasson cannula was introduced under direct  vision and secured with the pursestring suture.  The abdomen was insufflated  with carbon dioxide.  The laparoscope was introduced under direct vision and  the abdomen explored.  The operative ports were placed in the right upper  quadrant, in the midline, mid clavicular line, and anterior axillary line.  The fundus of the gallbladder was grasped and retracted cephalad.  There are  numerous omental adhesions to the undersurface of the gallbladder.  These  were taken down with blunt dissection.  Hemostasis was obtained with the  electrocautery.  These were relatively dense adhesions extending along the  extent of the gallbladder.  Dissection was carried down to the neck  of the  gallbladder.  The cystic duct is dissected out along its length and a clip  is placed at the neck of the gallbladder.  The cystic duct is incised.  A  hook cholangiography catheter is introduced through a stab wound in the  right upper quadrant.  It is inserted into the cystic duct and secured with  a Ligaclip.  Using C-arm fluoroscopy, real-time cholangiography is  performed.  There is a relatively long cystic duct.  There is a normal  caliber common bile duct.  There is rapid flow of contrast distally and into  the duodenum.  It does appear that the common duct empties into the small  duodenal diverticulum.  There is reflux of contrast into the proximal  biliary radicals without evidence of filling defect or obstruction.  The  clip is removed and hook catheter is withdrawn from the peritoneal cavity.  The cystic duct is triply clipped and divided.  The cystic artery is  dissected out along its length, doubly clipped and divided.  The gallbladder  is then excised  from the gallbladder bed using the hook electrocautery for  hemostasis.  The gallbladder was completely excised and placed into an endo-  catch bag.  The right upper quadrant is completely irrigated with warm  saline which is evacuated.  Good hemostasis was noted.  The gallbladder is  withdrawn through the epigastric port without difficulty.  The ports were  removed under direct vision.  Good hemostasis is noted.  The  pneumoperitoneum is released.  A 0 Vicryl pursestring suture is tied  securely at the umbilicus.  The port sites were anesthetized with local  anesthetic.  All wounds are closed with interrupted 4-0 Vicryl subcuticular  sutures.  The wounds are washed and dried and Benzoin and Steri-Strips are  applied.  Sterile gauze dressings are applied.  At this point, Dr. Karle Plumber took control of the procedure and will procedure with Zenker's  diverticulectomy to be dictated under a separate operative report.   The patient tolerated this first procedure very well.                                                Velora Heckler, M.D.    TMG/MEDQ  D:  06/29/2002  T:  06/29/2002  Job:  [id number]   cc:   Ines Bloomer, MD  [address]  [place]  [place] [address]  Fax: [phone]   Anselmo Rod, M.D.  [address], [address]  [place]  [place] [address]  Fax: [phone]

## 2011-02-02 NOTE — Discharge Summary (Signed)
NAME:  Hayley [name], [name]                            ACCOUNT NO.:  [id number]   MEDICAL RECORD NO.:  [id number]                   PATIENT TYPE:  INP   LOCATION:  3307                                 FACILITY:  [facility]   PHYSICIAN:  Ines Bloomer, MD                DATE OF BIRTH:  [birth year]   DATE OF ADMISSION:  06/29/2002  DATE OF DISCHARGE:  07/04/2002                                 DISCHARGE SUMMARY   ADMITTING DIAGNOSES:  1. Zenker's diverticulum.  2. Cholecystitis.   PAST MEDICAL HISTORY:  1. GERD.  2. Depression/anxiety.  3. Migraine headaches.  4. Degenerative joint disease.  5. Dystonia.  6. Strong family history of coronary artery disease.     a. Status post Cardiolite stress test which was normal in July 2003.  7. She is status post fracture of her right knee November 2000.   PAST SURGICAL HISTORY:  1. Vaginal hysterectomy July 1998.  2. Left carpal tunnel release.  3. Botox injections of larynx.  4. Colonoscopy August 2003.   ALLERGIES:  MORPHINE causes hallucinations, LODINE, PENICILLIN causes rash,  and DARVOCET causes nausea.   DISCHARGE DIAGNOSES:  1. Zenker's diverticulum.  2. Chronic cholecystitis, status post excision of Zenker's diverticulum and     laparoscopic cholecystectomy.   BRIEF HISTORY:  The patient is a 65 year old Caucasian female who had  complaints of voice changes and persistent reflux.  Her workup for this  included a barium swallow which demonstrated a 3-4 cm Zenker's diverticulum.  It was thought that possibly aspiration from this diverticulum was causing  her reflux.  During this time she also had complaints of chronic right upper  quadrant pain and nausea.  She was referred by Dr. Loreta Ave to Dr. Gerrit Friends for  consideration of possible cholecystectomy.  She was evaluated by Dr. Gerrit Friends  on June 03, 2002.  After examination of the patient and review of  additional workup Dr. Gerrit Friends recommended empiric cholecystectomy for  treatment of  her biliary colic and abdominal pain.  Half-and-Half to drink  during the study she did notice _______ .   On May 26, 2002, the patient was evaluated by Dr. Edwyna Shell at the [facility]  office.  After examination of the patient and review of all of her available  records, including discussing the findings from exam with Dr. Janit Pagan at [facility], Dr. Edwyna Shell recommended repair of her Zenker's diverticulum.  The  procedure, risks, and benefits were discussed with the patient and she  agreed to proceed.   HOSPITAL COURSE:  On June 29, 2002, the patient was electively admitted  to [facility] under the care of Drs. Gerkin and [employer].  She  underwent first a laparoscopic cholecystectomy with intraoperative  cholangiogram.  This was without complications, all incisions were closed,  sterile dressings were applied, and she then underwent and excision of her  Zenker's diverticulum.  This  procedure was also completed without  complication and a Jackson-Pratt drain was placed in her left neck.  She was  transferred in stable condition to the PACU.  She has remained  hemodynamically stable since surgery and her postoperative course has been  uneventful.  She was able to begin tolerating clear liquids on postoperative  day #2.   On July 02, 2002, she underwent a barium swallow test in radiology.  This  revealed no leak, normal-appearing esophagus without diverticulum.  At this  point her diet was advanced and she tolerated this well.  Today, July 03, 2002, she does continue to have some mild difficulty with swallowing but is  able to take in solid foods and she is drinking very well.  The Al Pimple drain was removed on postoperative day #4.  The morning of July 03, 2002 the patient is afebrile and her vital signs are stable.  Her wounds are  clean and dry, her heart is at a regular rate and rhythm, her lungs are  clear.  Her abdomen is soft, nontender, with active bowel  sounds.  She is  ambulating well and her pain is well controlled.  Please note that she did  develop a headache on postoperative day #2.  She was treated with her usual  home medications for this including a dose of Talwin which she says relieved  her headache very well.   The patient is progressing very well and recovering from her surgery.  It is  anticipated she will be ready for discharge home tomorrow, July 04, 2002.   RECENT LABORATORY STUDIES:  July 02, 2002 - WBC 7.0, hemoglobin 11.4,  hematocrit 34.8, platelets 276.  July 01, 2002 - sodium 139, potassium  4.2, chloride 101, CO2 33, glucose 129, BUN 12, creatinine 0.6, calcium 9.0.  Postoperative liver function studies that were drawn on June 25, 2002 and  June 30, 2002 were all within normal limits.  Surgical pathology has  returned as (1) gallbladder with cholesterolosis, (2) esophagus resection of  Zenker's diverticulum with benign squamous fine diverticulum consistent with  Zenker's diverticulum, no atypia or evidence of malignancy identified.   CONDITION ON DISCHARGE:  Improved.   INSTRUCTIONS ON DISCHARGE:  1. Medications:     a. Effexor 150 mg p.o. in the morning, 75 in the evening.     b. Nexium 40 mg p.o. q.d.     c. Imitrex, Bextra and Talwin as needed for treatment of her migraine        headaches.     d. Flexeril 10 mg p.o. p.r.n.     e. Ultram 50 mg one to two p.o. q.4-6h. p.r.n. for pain.  2. The patient has been encouraged to return to the headache center as     needed for refills of her prescriptions for her migraine headaches.  3. Activity: She has been asked to refrain from any heavy lifting and to     refrain from any driving right now.  She has been asked to continue her     breathing exercises and daily walking.  4. Diet: No restrictions.  She has been reminded to be sure she chews her     food well and swallow slowly. 5. Wound care: She may shower with mild soap and water.  If her  incision     becomes red, hot, swollen, drainage, or has a fever over 101 degrees     Fahrenheit she should call Dr. Scheryl Darter office.  6. Followup:     a. She has an appointment to see Dr. Edwyna Shell on Friday, July 10, 2002        at 12:30.  She will be asked to have a chest x-ray at [facility] at 11:30        that morning.     b. She has also been asked to call Dr. Ardine Eng office on Monday for a        followup there as needed.     Toribio Harbour, R.N.                  Ines Bloomer, MD    CTK/MEDQ  D:  07/03/2002  T:  07/05/2002  Job:  [id number]   cc:   Velora Heckler, M.D.  Fax: [phone]   Evelena Peat, MD   Anselmo Rod, M.D.  [phone]  [place]  [place] [address]  Fax: [phone]   Peter M. Swaziland, M.D.  [address]  [place], [place] [address]  Fax: [phone]   Orie Rout, M.D.

## 2011-02-02 NOTE — H&P (Signed)
[facility]  Patient:    Hayley [name], Hayley [name] Visit Number: [id number] MRN: [id number]          Service Type: MED Location: 3W 0354 01 Attending Physician:  Rosanne Sack Dictated by:   Rosanne Sack, M.D. Admit Date:  06/30/2001   CC:         Tammy R. Collins Scotland, M.D., [facility]   History and Physical  DATE OF BIRTH:  1946-07-23.  PROBLEM LIST:  1. Signs and symptom complex:  Intractable nausea, vomiting, diarrhea,     epigastric pain, rule out viral gastroenteritis versus drug-related     symptoms.  2. Dehydration/hypotension.  3. Pyuria, right flank tenderness, rule out urinary tract infection versus     pyelonephritis.  4. Microcytic anemia, hemoglobin 11.4, MCV 78.  5. History of abdominal pain.     a. EGD in April 1999, mild gastritis, negative stool test by Dr. Loreta Ave.     b. Upper GI series with small bowel follow-thorugh negative in April 1999.     c. Abdominal and pelvic CT scan negative except for fatty liver and        diverticulosis.  6. History of migraine headaches.  7. Chronic lower back pain secondary to degenerative joint disease and     scoliosis.     a. Ultram, prednisone, and muscular relaxers for the last four to five        days.  8. Chronic fatigue syndrome.  9. Mild to moderate bilateral carpal tunnel syndrome. 10. Obesity. 11. Allergies to PENICILLIN (hives), GI intolerance to CODEINE and LODINE.  CHIEF COMPLAINT:  Nausea, vomiting, diarrhea.  HISTORY OF PRESENT ILLNESS:  Hayley [name] is a very pleasant 65 year old female who presents for the history of generalized weakness, malaise, increased lower back pain, and stress at work.  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  The patient was seen in the [facility] on Thursday, about four days ago, with chief complaints  of lower back pain.  X-rays were consistent with DJD of the lumbar spine as well as scoliosis.  The patient was started on a prednisone taper, muscular relaxers, and she was asked to continue taking Ultram, which she has taken off and on for headaches.  For the last two days or so, the patient reports subjective fever, chills, along with anorexia and increased dyspepsia.  Since last night, the patient started having liquid diarrhea (about seven episodes), nausea and vomiting.  She denies dysuria, increased frequency, hematuria, or pressure urinary.  No abdominal cramping.  No melena, tarry stools, bright red blood per rectum.  No recent antibiotic therapy.  No recent barbecues or unusual foods.  No trips to the beach or foreign grips.  No focal weakness. No swallowing problems.  No syncope.  No chest pain.  No shortness of breath. The patient describes postnasal drip.  No productive cough.  No orthopnea.  No lower extremity swelling.  Hayley [name] describes numbness in the thumb and index finger, mostly in the mornings, in both hands.  She states that her current back pain is stable.  PAST MEDICAL HISTORY:  Per problem list.  ALLERGIES:  As in problem list.  MEDICATIONS: 1. Prednisone taper (currently on 20 mg a day). 2. Effexor XL 150 mg p.o. q.d. 3. Flexeril 12 mg b.i.d. 4. Imitrex 25 one tablet p.r.n. 5. Ultram  50 mg as needed t.i.d. 6. Prevacid 30 mg p.o. q.d. (the patient takes this medication occasionally). 7. Celebrex 200 mg p.o. q.d. (the patient takes it occasionally).  FAMILY MEDICAL HISTORY:  Significant for diabetes (mother and sister), hypertension (mother, father, grandparents, and sister).  Strokes (her mother had a stroke in her 11s).  Early coronary artery disease (her sister apparently died at the age of 2 from an acute MI, her father at age of 47, and also her grandparents died from heart trouble in their 52s).  No malignancy in the family.  SOCIAL HISTORY:  The  patient is married and has five children.  She does not smoke.  She does not drink.  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  REVIEW OF SYSTEMS:  As in HPI.  PHYSICAL EXAMINATION:  VITAL SIGNS:  Temperature 98.8, heart rate 103, blood pressure 95/64, respiratory rate 20, repeat blood pressure after IV fluids 116/73, oxygen saturation 98% on room air.  HEENT:  Normocephalic, atraumatic.  Nonicteric sclerae, conjunctivae within the normal limits.  PERRLA, EOMI, funduscopic exam negative for papilledema or hemorrhages.  Very dry mucous membrane.  Oropharynx clear except for signs of postnasal drip.  TMs within the normal limits.  NECK:  Supple, no JVD, no bruits, no adenopathy, no thyromegaly.  CHEST:  Lungs clear to auscultation bilaterally without crackles, wheezes. Fair air movement bilaterally.  CARDIAC:  A regular rate and rhythm, is slightly tachycardic, no murmurs, no rubs, no gallops.  ABDOMEN:  Obese, epigastric tenderness, no rebound, no guarding.  Bowel sounds were present.  No hepatosplenomegaly.  No masses, no bruits.  BREASTS:  Within the normal limits.  GENITOURINARY:  Within the normal limits.  RECTAL:  Empty vault, normal sphincter tone.  EXTREMITIES:  No edema, clubbing, or cyanosis.  Pulses 2+ bilaterally.  NEUROLOGIC:  Alert and oriented x 3.  Strength 5/5 in all extremities.  DTRs 3/5 in all extremities.  Cranial nerves II-XII intact.  Sensorium intact. Plantar reflexes downgoing bilaterally.  LABORATORY DATA:  Magnesium 1.9.  Sodium 137, potassium 3.7, chloride 109, CO2 25, BUN 15, creatinine 0.7, glucose 127.  Albumin 3.0, calcium 7.3, LFTs within the normal limits.  Amylase 48, lipase 17.  Urinalysis:  Small leukocyte esterase, wbcs 7-10.  Hemoglobin 11.4, MCV 78, WBC 5.2, platelets 283, absolute neutrophil count 4.4.  ASSESSMENT: 1. Sign and symptom complex (intractable nausea, vomiting, diarrhea, and    epigastric pain).   The differential diagnosis includes viral     gastroenteritis given the recent history of similar symptoms in the family.    Also, other etiology to be considered is side effects from her medications.    The patient was recently started on prednisone, muscle relaxers, along with    Ultram.  Also Effexor levels can be increased with Ultram, _____, and    muscular relaxers.  Finally, an infectious process like urinary tract    infection or pyelonephritis can present also with similar symptoms.  Plan:    Admit to a regular bed.  Will start intravenous Protonix, supportive care    with Phenergan, Imodium.  For now will hold all the medications and will be    working up the patient for possible UTI. 2. Dehydration/hypotension.  The patient has clear signs of dehydration on    physical exam.  The patients husband, who is in the room, reports that she    usually runs systolics in the 90s to low 100s.  For now will start IV  fluids.  Will monitor the patients fluid balance, daily weights, and    orthostatic blood pressures. 3. Pyuria associated with right costovertebral angle tenderness, rule out    urinary tract infection versus pyelonephritis.  Given the patients pyuria    and subjective fever and chills, urinary tract infection is possible.  The    patient does not have leukocytosis, and she denies other urinary symptoms.    Will obtain a urine culture and empirically will start Tequin. 4. Microcytic anemia.  The patient denies symptoms of any type of bleeding.    For now will check the stool for guaiac.  Also will obtain anemia studies    and will be following hemoglobin tomorrow morning after the IV fluids are    given.  For GI protection, also will be using Protonix intravenously. 5. Seasonal rhinitis and postnasal drip.  The patient has clear signs of    postnasal drip.  She has been exposed to a lot of dust at work along with    the seasonal changes.  Will start nasal steroids and will  follow    clinically. 6. Chronic fatigue syndrome.  Will check the TSH for screening process and    will follow this problem clinically. Dictated by:   Rosanne Sack, M.D. Attending Physician:  Rosanne Sack DD:  06/30/01 TD:  06/30/01 Job: [id number]

## 2011-02-02 NOTE — Op Note (Signed)
NAME:  JAKAYLAH, SCHLAFER                            ACCOUNT NO.:  [id number]   MEDICAL RECORD NO.:  [id number]                   PATIENT TYPE:  AMB   LOCATION:  ENDO                                 FACILITY:  [facility]   PHYSICIAN:  Charna Elizabeth, M.D.                   DATE OF BIRTH:  1946-02-03   DATE OF PROCEDURE:  05/13/2002  DATE OF DISCHARGE:  05/13/2002                                 OPERATIVE REPORT   PROCEDURE:  Colonoscopy.   ENDOSCOPIST:  Charna Elizabeth, M.D.   INSTRUMENT USED:  Olympus video colonoscope.   INDICATIONS FOR PROCEDURE:  Rectal bleeding with history of abdominal pain,  alternating bowel habits and mucous in the stool in a 65 year old white  female. Rule out colonic polyps, masses, hemorrhoids, etc.   PROCEDURE PREPARATION:  Informed consent was procured from the patient. The  patient fasted for eight hours prior to the procedure, was prepped with a  bottle of magnesium citrate and a gallon NuLytely the night prior to the  procedure.   PREPROCEDURE PHYSICAL:  The patient had stable vital signs. Neck supple.  Chest clear to auscultation. S1, S2 regular. Abdomen soft with normal bowel  sounds.   DESCRIPTION OF PROCEDURE:  The patient was placed in the left lateral  decubitus position and sedated with 70 mg of Demerol and 7 mg of Versed  intravenously. Once the patient was adequately sedated and maintained on low  flow oxygen and continuous cardiac monitoring, the Olympus video colonoscope  was advanced from the rectum to the cecum with slight difficulty. There was  some residual stool still in the colon. Multiple washings were done. There  was evidence of left sided diverticulosis and small internal hemorrhoids  seen on retroflexion. No masses, polyps, erosions or ulcerations were  present, the terminal ileum appeared normal. The appendiceal orifice and the  ileocecal valve were clearly visualized and photographed.   IMPRESSION:  1. Small nonbleeding internal  hemorrhoid.  2. Left sided diverticulosis.  3. No masses or polyp seen.  4. Normal terminal ileum.   RECOMMENDATIONS:  1. A high fiber diet has been discussed with the patient to include     nutrition and diverticular disease brochures have been given for patient     education.  2. Surgical evaluation performed by Dr. Darnell Level for laparoscopic     cholecystectomy considering her diagnosis     of biliary diskinesia and borderline EF from a recent HIDA scan.  3. Evaluation by Dr. Karle Plumber for Zenker's diverticulum.  4. Outpatient follow-up in the next two weeks.                                               Charna Elizabeth, M.D.    JM/MEDQ  D:  05/13/2002  T:  05/17/2002  Job:  [id number]   cc:   Kristian Covey, M.D.   Velora Heckler, M.D.  Fax: [phone]

## 2011-02-02 NOTE — Consult Note (Signed)
. [facility]  Patient:    Hayley [name], Hayley [name] Visit Number: [id number] MRN: [id number]          Service Type: EMS Location: ED Attending Physician:  Devoria Albe Dictated by:   Gloris Manchester. Lazarus Salines, M.D. Proc. Date: 02/26/02 Admit Date:  02/25/2002 Discharge Date: 02/26/2002   CC:         Dr. Janit Pagan at [facility]   Consultation Report  Datum OF CONSULTATION:  February 26, 2002, 0015 hours.  CHIEF COMPLAINT:  Breathing difficulty.  HISTORY OF PRESENT ILLNESS:  The patient is a 65 year old white female underwent bilateral vocal cord Botox injections yesterday by Dr. Janit Pagan at [facility] for vocal cord tremor.  She was told that this might or might not work for her.  Yesterday, her voice was already improved over prior to the procedure.  She had very little discomfort with the procedure, no breathing difficulty and no swelling problems.  Earlier today, she noticed that a couple of pills choked her pretty substantially but then she was able to breathe.  She noted that her cough was weak and not very functional.  Later, she was taking their 80-pound puppy out to feed and apparently he knocked her over and according to the husbands report she lay on the ground perhaps as much as 10 minutes unconscious.  They called the rescue squad but before they had arrived, she had resumed spontaneous respirations.  She was brought to the emergency room where she had multiple x-rays of head and x-rays of extremities looking for trauma.  I do not have these reports available.  In the emergency room on the x-ray table apparently she had one episode where she had some aspiration, attempted to cough, became red in the face and dropped her oxygen saturation down into the low 90s from the baseline of 97%.  Subsequently, she has breathed comfortably but is quite apprehensive that something is going on that will get her into further trouble later this evening.  She  still feels like swallowing is difficult.  EXAMINATION:  The patient is a slightly overweight fair-skinned redheaded middle-aged white female.  Her voice is intermittently phonatory but relatively weak.  When attempting to cough, she blows air but does not really interrupt the air flow with vigor as a routine cough.  There is no phlegm to produce.  She is breathing absolutely freely with no evidence of stridor, no labor respirations, no tachypnea.  She does not appear particularly apprehensive in terms of air hunger.  The head is atraumatic and neck supple. Ear canals are clear with aerated drums and normal configuration.  Cranial nerves intact.  Anterior nose is clear.  Oral cavity moist and clear with teeth in good repair.  Oropharynx clear.  Could not easily see nasopharynx or hypopharynx secondary to gag.  Neck exam reveals normal laryngeal crepitus. No hematoma or swelling or asymmetry.  PROCEDURE:  Following 2% viscous lidocaine to the nose, the flexible laryngoscope was introduced.  The nasopharynx was clear.  Oropharynx clear. Hypopharynx reveals mobile vocal cords which looked slightly bowed.  They are fully mobile in abduction and adduction.  No pooling in valleculae or piriforms.  I was able to slip the flexible laryngoscope almost all the way into the larynx before evoking a pain/choke response.  The upper trachea appears widely patent.  She is breathing comfortably during the procedure and overall tolerated it well.  IMPRESSION:  Adequate airway with possible reduced endolaryngeal sensation and possible  aspiration risk.  I do not have a clear explanation for why she had a sustained loss of consciousness.  PLAN:  From my standpoint I believe she is okay to go home and I did reassure her that I thought her airway was excellent.  I do not have intelligent comment about whether from the standpoint of loss of consciousness her situation is stable.  I asked her to call Dr.  Gwendel Hanson office in the morning for better advice regarding expectations for how long the botulinum toxin will take to have its maximal effect, whether this should be effecting endolaryngeal sensation, whether she needs to have modification of her swallowing technique such as supraglottic laryngectomy-type swallowing.  I have asked her to call for further difficulty.  She seems a little bit nervous about going home and I attempted to allay her anxieties with explanation. Dictated by:   Gloris Manchester. Lazarus Salines, M.D. Attending Physician:  Devoria Albe DD:  02/26/02 TD:  02/28/02 Job: [id number]

## 2011-02-02 NOTE — Discharge Summary (Signed)
[facility]  Patient:    Hayley [name], Hayley [name] Visit Number: [id number] MRN: [id number]          Service Type: MED Location: 3W 0354 01 Attending Physician:  Rosanne Sack Dictated by:   Gracelyn Nurse, M.D. Admit Date:  06/30/2001 Discharge Date: 07/03/2001   CC:         Tammy R. Collins Scotland, M.D.  Rodney A. Chaney Malling, M.D.   Discharge Summary  DISCHARGE DIAGNOSES:  1. Nausea and vomiting and diarrhea.  2. Urinary tract infection.  3. Anemia.  4. Back pain.  5. Headache.  6. Dehydration.  7. History of abdominal pain, esophagogastroduodenoscopy in April 1999, mild     gastritis by Dr. Loreta Ave.  Upper gastrointestinal series with small-bowel     follow-through negative in April 1999.  Abdominal pelvic CT negative     except for fatty liver and diverticulosis.  8. History of migraine headaches.  9. Chronic low back pain secondary to degenerative joint disease and     scoliosis. 10. Chronic fatigue syndrome. 11. History of carpal tunnel syndrome. 12. Obesity.  DISCHARGE MEDICATIONS: 1. Effexor 150 mg q.d. 2. Prevacid 30 mg q.d. 3. Celebrex 200 mg q.d. 4. Imitrex 25 mg one p.r.n. 5. Ultram 50 mg t.i.d. p.r.n. 6. Restoril 15 mg q.h.s. p.r.n. #7 only.  PROCEDURES:  The patient had a lumbar MRI per Dr. Sherlean Foot, results pending.  HISTORY OF PRESENT ILLNESS:  This is a 65 year old white female who presents with a history of generalized weakness, malaise, increased lower back pain, and stress at work.  The patient works at [employer], is currently working long hours for the past two weeks.  The patient was seen by her primary care doctor about four days ago complaining of low back pain.  X-rays were consistent with degenerative joint disease.  She was started on a prednisone taper and muscle relaxors.  The last two days the patient has reported subjective fever, chills, along with anorexia and increased shortness of breath.  Since last  night, the patient started having liquid diarrhea, nausea and vomiting.  She denies dysuria, no recent antibiotic therapy.  PHYSICAL EXAMINATION:  VITAL SIGNS:  Temperature 98.8, heart rate 103, blood pressure 95/64, respiratory rate 20, O2 saturation 98% on room air.  HEENT:  Normocephalic.  Nonicteric.  Pupils are equal, round and reactive to light.  Extraocular movements intact.  Oropharynx clear.  Tympanic membranes within normal limits.  Oral mucosa dry.  NECK:  Supple, no JVD, no bruits, no adenopathy.  LUNGS:  Clear to auscultation.  CARDIOVASCULAR:  Tachycardic with no murmurs.  ABDOMEN:  Obese, soft, bowel sounds are positive.  Some mild epigastric tenderness.  RECTAL:  Normal sphincter tone.  EXTREMITIES:  No edema, 2+ pulses.  NEUROLOGIC:  Cranial nerves II-XII grossly intact.  No focal deficits. Strength 5/5 bilaterally.  HOSPITAL COURSE:  #1 - NAUSEA AND VOMITING AND DIARRHEA:  The patient was kept n.p.o., given some IV Protonix and IV hydration.  Responded well to this treatment.  By hospital day #2, nausea and vomiting and diarrhea had completely cleared up, and so had mild abdominal pain.  She did have a history of gastritis, and she had only been taking her Prevacid on a p.r.n. basis.  I recommended that she take Prevacid on a daily basis from now on, and follow up with her primary care physician for this.  #2 - URINARY TRACT INFECTION:  She was treated with a three day course of Tequin, even  though she was asymptomatic.  We thought this may be contributing to her back pain.  #3 - BACK PAIN:  Had orthopedic consult, Dr. Sherlean Foot, and his partner Dr. Chaney Malling who has followed her before as an outpatient saw her.  Ordered a MRI, and will follow her up in his office as an outpatient to go over the MRI and make recommendations.  #4 - ANEMIA:  She does have signs of mild anemia, and her iron stores are low. I did not start her on iron therapy now with this GI  upset going on, but recommended that she be started on this as an outpatient, and follow up with her primary care physician on this.  #5 - HEADACHE:  She complained of headache coming in.  Her Effexor was withheld because of the GI symptoms, and headache got worse.  Felt this may have been a withdrawal headache from the Effexor, and that was restarted, and headache resolved.  DISCHARGE LABORATORY DATA:  Sodium 139, potassium 3.9, chloride 113, CO2 26, BUN 6, creatinine 0.7, glucose 108.  TSH 0.79.  White blood cell count 4.3, hemoglobin 12, platelets 284. Dictated by:   Gracelyn Nurse, M.D. Attending Physician:  Rosanne Sack DD:  07/03/01 TD:  07/06/01 Job: [id number]

## 2011-02-02 NOTE — Op Note (Signed)
NAME:  Hayley [name], Hayley [name]                            ACCOUNT NO.:  [id number]   MEDICAL RECORD NO.:  [id number]                   PATIENT TYPE:  OIB   LOCATION:  3307                                 FACILITY:  [facility]   PHYSICIAN:  Ines Bloomer, MD                DATE OF BIRTH:  1946-03-19   DATE OF PROCEDURE:  DATE OF DISCHARGE:                                 OPERATIVE REPORT   PREOPERATIVE DIAGNOSIS:  Dysphagia secondary to moderate to large Zenker's  diverticulum.   POSTOPERATIVE DIAGNOSIS:  Dysphagia secondary to moderate to large Zenker's  diverticulum.   OPERATION:  Zenker's diverticulectomy with cricopharyngeal myotomy.   SURGEON:  D.P. Edwyna Shell, M.D.   FIRST ASSISTANT:  Maxwell Marion, R.N.   ANESTHESIA:  General anesthesia.   This patient had a previous cholecystectomy by Dr. Gerrit Friends, and then a left  neck was prepped and draped in the usual sterile manner. A #28 bougie was  passed through the mouth without difficulty. Incision was made along the  border of the left sternocleidomastoid and carried down with electrocautery  through the subcutaneous tissue and fascia. Sternocleidomastoid was  reflected laterally. Dissection was carried down, identifying the thyroid  and then identifying the jugular vein. Small branch of the jugular vein to  the thyroid, the middle thyroid vein, was doubly ligated and divided. NG  suction carried down further, and the superior thyroid artery was dissected  out, doubly ligated and divided. The omohyoid was identified and preserved  both medially as well as the ansa hypoglossus was dissected up and  preserved. Then the internal jugular vein and artery were reflected  bilaterally. The internal jugular vein and carotid artery as well as the  carotid sheath contents including the vagus nerve were all reflected  laterally and protected. The bougie was palpated, and the esophagus was  identified. Dissection was started at the cricopharyngeal  superiorly to it,  dissecting up. There was a tremendous amount of inflammation with a bunch of  small veins and arteries that bleed easily, and these had to be ligated or  tied with 3-0 silk. After reflecting these off, the diverticulum was  identified and dissected up to its base. It had kind of a wide base, about a  1.5 to 2 cm base, and the cricopharyngeal myotomy was done, dividing the  cricopharyngeus approximately 2 to 2.5 cm down the esophagus on the lateral  wall. At the base of the diverticulum, there was evidence of an open area in  the mucosa, and this was sutured with interrupted 3-0 Vicryls, and then the  base was divided with [employer] Endo GIA 35 stapler. After the myotomy had  been done and the diverticulum resected, a Jackson-Pratt was placed in the  base of the wound, brought in through a separate stab wound, and tied in  place with 0 silk. The bougie was still in place, and it  could be seen that  there was good room around the bougie so that there was no evidence of  stricture. The wound was then closed in layers with interrupted 2-0 Vicryl,  the muscle layer 3-0 Vicryl, and subcutaneous tissue in 4-0 Vicryl in a  subcuticular stitch. The patient tolerated the procedure well and was  returned to the recovery room in stable condition.                                               Ines Bloomer, MD    DPB/MEDQ  D:  06/29/2002  T:  06/30/2002  Job:  [id number]

## 2011-02-06 ENCOUNTER — Other Ambulatory Visit (HOSPITAL_COMMUNITY): Payer: Self-pay | Admitting: Endocrinology

## 2011-02-06 DIAGNOSIS — E0590 Thyrotoxicosis, unspecified without thyrotoxic crisis or storm: Secondary | ICD-10-CM

## 2011-02-21 ENCOUNTER — Encounter (HOSPITAL_COMMUNITY)
Admission: RE | Admit: 2011-02-21 | Discharge: 2011-02-21 | Disposition: A | Discharge status: Home / Self Care | Payer: Medicare Other | Source: Ambulatory Visit | Attending: Endocrinology | Admitting: Endocrinology

## 2011-02-21 DIAGNOSIS — E0520 Thyrotoxicosis with toxic multinodular goiter without thyrotoxic crisis or storm: Secondary | ICD-10-CM | POA: Insufficient documentation

## 2011-02-21 DIAGNOSIS — E0590 Thyrotoxicosis, unspecified without thyrotoxic crisis or storm: Secondary | ICD-10-CM

## 2011-02-22 ENCOUNTER — Encounter (HOSPITAL_COMMUNITY): Payer: Self-pay

## 2011-02-22 ENCOUNTER — Encounter (HOSPITAL_COMMUNITY)
Admission: RE | Admit: 2011-02-22 | Discharge: 2011-02-22 | Disposition: A | Discharge status: Home / Self Care | Payer: Medicare Other | Source: Ambulatory Visit | Attending: Endocrinology | Admitting: Endocrinology

## 2011-02-22 HISTORY — DX: Thyrotoxicosis, unspecified without thyrotoxic crisis or storm: E05.90

## 2011-02-22 MED ORDER — SODIUM PERTECHNETATE TC 99M INJECTION
10.4000 | Freq: Once | INTRAVENOUS | Status: AC | PRN
  Administered 2011-02-22: 10.4 via INTRAVENOUS

## 2011-02-22 MED ORDER — SODIUM IODIDE I 131 CAPSULE
15.1000 | Freq: Once | INTRAVENOUS | Status: AC | PRN
  Administered 2011-02-21: 15.1 via ORAL

## 2011-03-09 ENCOUNTER — Other Ambulatory Visit: Payer: Self-pay | Admitting: Endocrinology

## 2011-03-09 DIAGNOSIS — E042 Nontoxic multinodular goiter: Secondary | ICD-10-CM

## 2011-03-13 ENCOUNTER — Other Ambulatory Visit (HOSPITAL_COMMUNITY)
Admission: RE | Admit: 2011-03-13 | Discharge: 2011-03-13 | Disposition: A | Discharge status: Home / Self Care | Payer: Medicare Other | Source: Ambulatory Visit | Attending: Interventional Radiology | Admitting: Interventional Radiology

## 2011-03-13 ENCOUNTER — Inpatient Hospital Stay: Admission: RE | Admit: 2011-03-13 | Payer: Medicare Other | Source: Ambulatory Visit

## 2011-03-13 ENCOUNTER — Ambulatory Visit
Admission: RE | Admit: 2011-03-13 | Discharge: 2011-03-13 | Disposition: A | Discharge status: Home / Self Care | Payer: Medicare Other | Source: Ambulatory Visit | Attending: Endocrinology | Admitting: Endocrinology

## 2011-03-13 ENCOUNTER — Other Ambulatory Visit: Payer: Medicare Other

## 2011-03-13 ENCOUNTER — Other Ambulatory Visit: Payer: Self-pay | Admitting: Interventional Radiology

## 2011-03-13 DIAGNOSIS — E049 Nontoxic goiter, unspecified: Secondary | ICD-10-CM | POA: Insufficient documentation

## 2011-03-13 DIAGNOSIS — E042 Nontoxic multinodular goiter: Secondary | ICD-10-CM

## 2011-03-22 ENCOUNTER — Other Ambulatory Visit (HOSPITAL_COMMUNITY): Payer: Self-pay | Admitting: Endocrinology

## 2011-03-22 DIAGNOSIS — E0500 Thyrotoxicosis with diffuse goiter without thyrotoxic crisis or storm: Secondary | ICD-10-CM

## 2011-03-30 ENCOUNTER — Encounter (HOSPITAL_COMMUNITY)
Admission: RE | Admit: 2011-03-30 | Discharge: 2011-03-30 | Disposition: A | Discharge status: Home / Self Care | Payer: Medicare Other | Source: Ambulatory Visit | Attending: Endocrinology | Admitting: Endocrinology

## 2011-03-30 ENCOUNTER — Encounter (HOSPITAL_COMMUNITY): Payer: Self-pay

## 2011-03-30 DIAGNOSIS — E0500 Thyrotoxicosis with diffuse goiter without thyrotoxic crisis or storm: Secondary | ICD-10-CM | POA: Insufficient documentation

## 2011-03-30 HISTORY — DX: Thyrotoxicosis with diffuse goiter without thyrotoxic crisis or storm: E05.00

## 2011-03-30 MED ORDER — SODIUM IODIDE I 131 CAPSULE
29.7000 | Freq: Once | INTRAVENOUS | Status: AC | PRN
  Administered 2011-03-30: 29.7 via ORAL

## 2011-06-07 ENCOUNTER — Other Ambulatory Visit: Payer: Self-pay | Admitting: Orthopedic Surgery

## 2011-06-07 DIAGNOSIS — M25532 Pain in left wrist: Secondary | ICD-10-CM

## 2011-06-14 ENCOUNTER — Ambulatory Visit
Admission: RE | Admit: 2011-06-14 | Discharge: 2011-06-14 | Disposition: A | Discharge status: Home / Self Care | Payer: Medicare Other | Source: Ambulatory Visit | Attending: Orthopedic Surgery | Admitting: Orthopedic Surgery

## 2011-06-14 DIAGNOSIS — M25532 Pain in left wrist: Secondary | ICD-10-CM

## 2011-06-14 MED ORDER — IOHEXOL 180 MG/ML  SOLN
1.5000 mL | Freq: Once | INTRAMUSCULAR | Status: AC | PRN
  Administered 2011-06-14: 1.5 mL via INTRA_ARTICULAR

## 2011-07-13 DIAGNOSIS — J385 Laryngeal spasm: Secondary | ICD-10-CM | POA: Insufficient documentation

## 2011-07-31 ENCOUNTER — Other Ambulatory Visit (HOSPITAL_COMMUNITY): Payer: Self-pay | Admitting: Internal Medicine

## 2011-07-31 DIAGNOSIS — R0602 Shortness of breath: Secondary | ICD-10-CM

## 2011-08-07 ENCOUNTER — Encounter (HOSPITAL_COMMUNITY): Payer: Medicare Other

## 2011-09-27 DIAGNOSIS — M47817 Spondylosis without myelopathy or radiculopathy, lumbosacral region: Secondary | ICD-10-CM | POA: Diagnosis not present

## 2011-09-27 DIAGNOSIS — Z885 Allergy status to narcotic agent status: Secondary | ICD-10-CM | POA: Diagnosis not present

## 2011-09-27 DIAGNOSIS — E669 Obesity, unspecified: Secondary | ICD-10-CM | POA: Diagnosis not present

## 2011-09-27 DIAGNOSIS — G4733 Obstructive sleep apnea (adult) (pediatric): Secondary | ICD-10-CM | POA: Diagnosis not present

## 2011-09-27 DIAGNOSIS — Z880 Allergy status to penicillin: Secondary | ICD-10-CM | POA: Diagnosis not present

## 2011-09-27 DIAGNOSIS — IMO0002 Reserved for concepts with insufficient information to code with codable children: Secondary | ICD-10-CM | POA: Diagnosis not present

## 2011-09-27 DIAGNOSIS — Z79899 Other long term (current) drug therapy: Secondary | ICD-10-CM | POA: Diagnosis not present

## 2011-10-04 DIAGNOSIS — Z23 Encounter for immunization: Secondary | ICD-10-CM | POA: Diagnosis not present

## 2011-11-07 DIAGNOSIS — E039 Hypothyroidism, unspecified: Secondary | ICD-10-CM | POA: Diagnosis not present

## 2011-11-22 DIAGNOSIS — G43709 Chronic migraine without aura, not intractable, without status migrainosus: Secondary | ICD-10-CM | POA: Diagnosis not present

## 2011-11-27 ENCOUNTER — Emergency Department (HOSPITAL_COMMUNITY): Payer: Medicare Other

## 2011-11-27 ENCOUNTER — Encounter (HOSPITAL_COMMUNITY): Payer: Self-pay | Admitting: *Deleted

## 2011-11-27 ENCOUNTER — Encounter (HOSPITAL_COMMUNITY): Payer: Self-pay

## 2011-11-27 ENCOUNTER — Emergency Department (HOSPITAL_COMMUNITY): Payer: Medicare Other | Admitting: *Deleted

## 2011-11-27 ENCOUNTER — Inpatient Hospital Stay (HOSPITAL_COMMUNITY)
Admission: EM | Admit: 2011-11-27 | Discharge: 2011-12-11 | DRG: 338 | Disposition: A | Discharge status: Home / Self Care | Payer: Medicare Other | Attending: Surgery | Admitting: Surgery

## 2011-11-27 ENCOUNTER — Encounter (HOSPITAL_COMMUNITY): Admission: EM | Disposition: A | Discharge status: Home / Self Care | Payer: Self-pay | Source: Home / Self Care

## 2011-11-27 DIAGNOSIS — K589 Irritable bowel syndrome without diarrhea: Secondary | ICD-10-CM | POA: Insufficient documentation

## 2011-11-27 DIAGNOSIS — I809 Phlebitis and thrombophlebitis of unspecified site: Secondary | ICD-10-CM | POA: Diagnosis not present

## 2011-11-27 DIAGNOSIS — I2699 Other pulmonary embolism without acute cor pulmonale: Secondary | ICD-10-CM | POA: Diagnosis not present

## 2011-11-27 DIAGNOSIS — R0602 Shortness of breath: Secondary | ICD-10-CM | POA: Diagnosis present

## 2011-11-27 DIAGNOSIS — Y836 Removal of other organ (partial) (total) as the cause of abnormal reaction of the patient, or of later complication, without mention of misadventure at the time of the procedure: Secondary | ICD-10-CM | POA: Diagnosis present

## 2011-11-27 DIAGNOSIS — E876 Hypokalemia: Secondary | ICD-10-CM | POA: Diagnosis not present

## 2011-11-27 DIAGNOSIS — K5730 Diverticulosis of large intestine without perforation or abscess without bleeding: Secondary | ICD-10-CM | POA: Diagnosis present

## 2011-11-27 DIAGNOSIS — F411 Generalized anxiety disorder: Secondary | ICD-10-CM | POA: Diagnosis present

## 2011-11-27 DIAGNOSIS — K219 Gastro-esophageal reflux disease without esophagitis: Secondary | ICD-10-CM | POA: Diagnosis present

## 2011-11-27 DIAGNOSIS — J383 Other diseases of vocal cords: Secondary | ICD-10-CM

## 2011-11-27 DIAGNOSIS — IMO0002 Reserved for concepts with insufficient information to code with codable children: Secondary | ICD-10-CM | POA: Diagnosis present

## 2011-11-27 DIAGNOSIS — K3580 Unspecified acute appendicitis: Secondary | ICD-10-CM | POA: Diagnosis not present

## 2011-11-27 DIAGNOSIS — Z6837 Body mass index (BMI) 37.0-37.9, adult: Secondary | ICD-10-CM

## 2011-11-27 DIAGNOSIS — R059 Cough, unspecified: Secondary | ICD-10-CM | POA: Diagnosis not present

## 2011-11-27 DIAGNOSIS — K3533 Acute appendicitis with perforation and localized peritonitis, with abscess: Principal | ICD-10-CM | POA: Diagnosis present

## 2011-11-27 DIAGNOSIS — G43909 Migraine, unspecified, not intractable, without status migrainosus: Secondary | ICD-10-CM | POA: Diagnosis not present

## 2011-11-27 DIAGNOSIS — R0789 Other chest pain: Secondary | ICD-10-CM | POA: Diagnosis not present

## 2011-11-27 DIAGNOSIS — F329 Major depressive disorder, single episode, unspecified: Secondary | ICD-10-CM | POA: Diagnosis present

## 2011-11-27 DIAGNOSIS — R109 Unspecified abdominal pain: Secondary | ICD-10-CM | POA: Diagnosis not present

## 2011-11-27 DIAGNOSIS — R0902 Hypoxemia: Secondary | ICD-10-CM | POA: Diagnosis not present

## 2011-11-27 DIAGNOSIS — G43709 Chronic migraine without aura, not intractable, without status migrainosus: Secondary | ICD-10-CM | POA: Diagnosis present

## 2011-11-27 DIAGNOSIS — R1031 Right lower quadrant pain: Secondary | ICD-10-CM | POA: Diagnosis not present

## 2011-11-27 DIAGNOSIS — R9389 Abnormal findings on diagnostic imaging of other specified body structures: Secondary | ICD-10-CM | POA: Diagnosis not present

## 2011-11-27 DIAGNOSIS — K5900 Constipation, unspecified: Secondary | ICD-10-CM | POA: Diagnosis not present

## 2011-11-27 DIAGNOSIS — J387 Other diseases of larynx: Secondary | ICD-10-CM | POA: Diagnosis present

## 2011-11-27 DIAGNOSIS — I808 Phlebitis and thrombophlebitis of other sites: Secondary | ICD-10-CM | POA: Diagnosis not present

## 2011-11-27 DIAGNOSIS — R05 Cough: Secondary | ICD-10-CM | POA: Diagnosis not present

## 2011-11-27 DIAGNOSIS — F3289 Other specified depressive episodes: Secondary | ICD-10-CM | POA: Diagnosis present

## 2011-11-27 DIAGNOSIS — R112 Nausea with vomiting, unspecified: Secondary | ICD-10-CM | POA: Diagnosis not present

## 2011-11-27 DIAGNOSIS — K560 Paralytic ileus: Secondary | ICD-10-CM | POA: Diagnosis not present

## 2011-11-27 DIAGNOSIS — K7689 Other specified diseases of liver: Secondary | ICD-10-CM | POA: Diagnosis present

## 2011-11-27 DIAGNOSIS — G4733 Obstructive sleep apnea (adult) (pediatric): Secondary | ICD-10-CM | POA: Diagnosis present

## 2011-11-27 DIAGNOSIS — E119 Type 2 diabetes mellitus without complications: Secondary | ICD-10-CM | POA: Insufficient documentation

## 2011-11-27 HISTORY — DX: Morbid (severe) obesity due to excess calories: E66.01

## 2011-11-27 HISTORY — DX: Polyp of colon: K63.5

## 2011-11-27 HISTORY — DX: Reserved for inherently not codable concepts without codable children: IMO0001

## 2011-11-27 HISTORY — DX: Other diseases of vocal cords: J38.3

## 2011-11-27 HISTORY — DX: Gastro-esophageal reflux disease without esophagitis: K21.9

## 2011-11-27 HISTORY — PX: LAPAROSCOPIC APPENDECTOMY: SHX408

## 2011-11-27 HISTORY — DX: Migraine, unspecified, not intractable, without status migrainosus: G43.909

## 2011-11-27 HISTORY — DX: Pelvic and perineal pain: R10.2

## 2011-11-27 HISTORY — DX: Unspecified osteoarthritis, unspecified site: M19.90

## 2011-11-27 LAB — COMPREHENSIVE METABOLIC PANEL
ALT: 8 U/L (ref 0–35)
AST: 14 U/L (ref 0–37)
Albumin: 3.5 g/dL (ref 3.5–5.2)
Alkaline Phosphatase: 62 U/L (ref 39–117)
BUN: 5 mg/dL — ABNORMAL LOW (ref 6–23)
CO2: 25 mEq/L (ref 19–32)
Calcium: 8.8 mg/dL (ref 8.4–10.5)
Chloride: 106 mEq/L (ref 96–112)
Creatinine, Ser: 0.62 mg/dL (ref 0.50–1.10)
GFR calc Af Amer: >90 mL/min (ref >90)
GFR calc non Af Amer: >90 mL/min (ref >90)
Glucose, Bld: 127 mg/dL — ABNORMAL HIGH (ref 70–99)
Potassium: 3.8 mEq/L (ref 3.5–5.1)
Sodium: 137 mEq/L (ref 135–145)
Total Bilirubin: 0.5 mg/dL (ref 0.3–1.2)
Total Protein: 7.1 g/dL (ref 6.0–8.3)

## 2011-11-27 LAB — DIFFERENTIAL
Basophils Absolute: 0 10*3/uL (ref 0.0–0.1)
Basophils Relative: 0 % (ref 0–1)
Eosinophils Absolute: 0.1 10*3/uL (ref 0.0–0.7)
Eosinophils Relative: 1 % (ref 0–5)
Lymphocytes Relative: 8 % — ABNORMAL LOW (ref 12–46)
Lymphs Abs: 1.1 10*3/uL (ref 0.7–4.0)
Monocytes Absolute: 1 10*3/uL (ref 0.1–1.0)
Monocytes Relative: 8 % (ref 3–12)
Neutro Abs: 10.7 10*3/uL — ABNORMAL HIGH (ref 1.7–7.7)
Neutrophils Relative %: 83 % — ABNORMAL HIGH (ref 43–77)

## 2011-11-27 LAB — CBC
HCT: 42 % (ref 36.0–46.0)
Hemoglobin: 13.3 g/dL (ref 12.0–15.0)
MCH: 27.9 pg (ref 26.0–34.0)
MCHC: 31.7 g/dL (ref 30.0–36.0)
MCV: 88.1 fL (ref 78.0–100.0)
Platelets: 238 10*3/uL (ref 150–400)
RBC: 4.77 MIL/uL (ref 3.87–5.11)
RDW: 14.1 % (ref 11.5–15.5)
WBC: 12.9 10*3/uL — ABNORMAL HIGH (ref 4.0–10.5)

## 2011-11-27 LAB — URINALYSIS, ROUTINE W REFLEX MICROSCOPIC
Bilirubin Urine: NEGATIVE
Glucose, UA: NEGATIVE mg/dL
Hgb urine dipstick: NEGATIVE
Ketones, ur: NEGATIVE mg/dL
Leukocytes, UA: NEGATIVE
Nitrite: NEGATIVE
Protein, ur: NEGATIVE mg/dL
Specific Gravity, Urine: 1.015 (ref 1.005–1.030)
Urobilinogen, UA: 0.2 mg/dL (ref 0.0–1.0)
pH: 6 (ref 5.0–8.0)

## 2011-11-27 LAB — LIPASE, BLOOD: Lipase: 35 U/L (ref 11–59)

## 2011-11-27 LAB — LACTIC ACID, PLASMA: Lactic Acid, Venous: 0.8 mmol/L (ref 0.5–2.2)

## 2011-11-27 LAB — GLUCOSE, CAPILLARY: Glucose-Capillary: 181 mg/dL — ABNORMAL HIGH (ref 70–99)

## 2011-11-27 SURGERY — APPENDECTOMY, LAPAROSCOPIC
Anesthesia: General | Site: Abdomen | Wound class: Contaminated

## 2011-11-27 MED ORDER — ONDANSETRON HCL 4 MG/2ML IJ SOLN
INTRAMUSCULAR | Status: DC | PRN
  Administered 2011-11-27: 4 mg via INTRAVENOUS

## 2011-11-27 MED ORDER — GABAPENTIN 400 MG PO CAPS
800.0000 mg | ORAL_CAPSULE | Freq: Two times a day (BID) | ORAL | Status: DC
  Administered 2011-11-28 – 2011-12-11 (×27): 800 mg via ORAL
  Filled 2011-11-27 (×33): qty 2

## 2011-11-27 MED ORDER — HEPARIN SODIUM (PORCINE) 5000 UNIT/ML IJ SOLN
5000.0000 [IU] | Freq: Three times a day (TID) | INTRAMUSCULAR | Status: DC
  Filled 2011-11-27 (×4): qty 1

## 2011-11-27 MED ORDER — PROMETHAZINE HCL 25 MG/ML IJ SOLN
25.0000 mg | Freq: Once | INTRAMUSCULAR | Status: DC
  Filled 2011-11-27: qty 1

## 2011-11-27 MED ORDER — FENTANYL CITRATE 0.05 MG/ML IJ SOLN
INTRAMUSCULAR | Status: DC | PRN
  Administered 2011-11-27: 100 ug via INTRAVENOUS

## 2011-11-27 MED ORDER — GABAPENTIN 800 MG PO TABS
800.0000 mg | ORAL_TABLET | Freq: Two times a day (BID) | ORAL | Status: DC
  Filled 2011-11-27: qty 1

## 2011-11-27 MED ORDER — LACTATED RINGERS IV SOLN
INTRAVENOUS | Status: DC | PRN
  Administered 2011-11-27 (×2): via INTRAVENOUS

## 2011-11-27 MED ORDER — HYDROMORPHONE HCL PF 1 MG/ML IJ SOLN
0.5000 mg | INTRAMUSCULAR | Status: DC | PRN
  Administered 2011-11-27 – 2011-11-28 (×5): 1 mg via INTRAVENOUS
  Filled 2011-11-27 (×3): qty 1

## 2011-11-27 MED ORDER — SODIUM CHLORIDE 0.9 % IV SOLN
INTRAVENOUS | Status: AC
  Filled 2011-11-27: qty 1

## 2011-11-27 MED ORDER — ERTAPENEM SODIUM 1 G IJ SOLR: 1.0000 g | INTRAMUSCULAR | Status: DC

## 2011-11-27 MED ORDER — BUPIVACAINE-EPINEPHRINE 0.25% -1:200000 IJ SOLN
INTRAMUSCULAR | Status: AC
  Filled 2011-11-27: qty 1

## 2011-11-27 MED ORDER — GLYCOPYRROLATE 0.2 MG/ML IJ SOLN
INTRAMUSCULAR | Status: DC | PRN
  Administered 2011-11-27: 0.6 mg via INTRAVENOUS

## 2011-11-27 MED ORDER — SODIUM CHLORIDE 0.9 % IV SOLN
1.0000 g | INTRAVENOUS | Status: DC | PRN
  Administered 2011-11-27: 1 g via INTRAVENOUS

## 2011-11-27 MED ORDER — BISACODYL 10 MG RE SUPP: 10.0000 mg | Freq: Two times a day (BID) | RECTAL | Status: DC | PRN

## 2011-11-27 MED ORDER — METOPROLOL SUCCINATE ER 50 MG PO TB24
50.0000 mg | ORAL_TABLET | ORAL | Status: DC
  Filled 2011-11-27: qty 1

## 2011-11-27 MED ORDER — METOPROLOL TARTRATE 1 MG/ML IV SOLN: 5.0000 mg | Freq: Four times a day (QID) | INTRAVENOUS | Status: DC | PRN

## 2011-11-27 MED ORDER — BUPIVACAINE-EPINEPHRINE 0.25% -1:200000 IJ SOLN
INTRAMUSCULAR | Status: DC | PRN
  Administered 2011-11-27: 50 mL

## 2011-11-27 MED ORDER — HYDROMORPHONE BOLUS VIA INFUSION: 0.5000 mg | INTRAVENOUS | Status: DC | PRN

## 2011-11-27 MED ORDER — ONDANSETRON HCL 4 MG/2ML IJ SOLN
4.0000 mg | Freq: Once | INTRAMUSCULAR | Status: AC
  Administered 2011-11-27: 4 mg via INTRAVENOUS
  Filled 2011-11-27: qty 2

## 2011-11-27 MED ORDER — SUCCINYLCHOLINE CHLORIDE 20 MG/ML IJ SOLN
INTRAMUSCULAR | Status: DC | PRN
  Administered 2011-11-27: 100 mg via INTRAVENOUS

## 2011-11-27 MED ORDER — CYCLOBENZAPRINE HCL 10 MG PO TABS: 10.0000 mg | ORAL_TABLET | Freq: Every day | ORAL | Status: DC

## 2011-11-27 MED ORDER — PROMETHAZINE HCL 25 MG/ML IJ SOLN: 6.2500 mg | INTRAMUSCULAR | Status: DC | PRN

## 2011-11-27 MED ORDER — HYDROMORPHONE HCL PF 1 MG/ML IJ SOLN
1.0000 mg | Freq: Once | INTRAMUSCULAR | Status: AC
  Administered 2011-11-27: 1 mg via INTRAVENOUS
  Filled 2011-11-27: qty 1

## 2011-11-27 MED ORDER — LIP MEDEX EX OINT
1.0000 "application " | TOPICAL_OINTMENT | Freq: Two times a day (BID) | CUTANEOUS | Status: DC
  Filled 2011-11-27: qty 7

## 2011-11-27 MED ORDER — LACTATED RINGERS IV SOLN: INTRAVENOUS | Status: DC

## 2011-11-27 MED ORDER — LEVOTHYROXINE SODIUM 25 MCG PO TABS
25.0000 ug | ORAL_TABLET | Freq: Every day | ORAL | Status: DC
  Filled 2011-11-27: qty 1

## 2011-11-27 MED ORDER — PROPOFOL 10 MG/ML IV EMUL
INTRAVENOUS | Status: DC | PRN
  Administered 2011-11-27: 200 mg via INTRAVENOUS

## 2011-11-27 MED ORDER — INSULIN ASPART 100 UNIT/ML ~~LOC~~ SOLN
0.0000 [IU] | SUBCUTANEOUS | Status: DC
  Administered 2011-11-28: 4 [IU] via SUBCUTANEOUS
  Administered 2011-11-28 (×3): 3 [IU] via SUBCUTANEOUS
  Administered 2011-11-29: 4 [IU] via SUBCUTANEOUS
  Administered 2011-11-29 (×3): 3 [IU] via SUBCUTANEOUS

## 2011-11-27 MED ORDER — DEXAMETHASONE SODIUM PHOSPHATE 10 MG/ML IJ SOLN
INTRAMUSCULAR | Status: DC | PRN
  Administered 2011-11-27: 10 mg via INTRAVENOUS

## 2011-11-27 MED ORDER — CLONAZEPAM 0.5 MG PO TABS: 0.5000 mg | ORAL_TABLET | Freq: Every day | ORAL | Status: DC | PRN

## 2011-11-27 MED ORDER — HYDROMORPHONE HCL PF 2 MG/ML IJ SOLN
INTRAMUSCULAR | Status: AC
  Administered 2011-11-27: 2 mg
  Filled 2011-11-27: qty 1

## 2011-11-27 MED ORDER — NEOSTIGMINE METHYLSULFATE 1 MG/ML IJ SOLN
INTRAMUSCULAR | Status: DC | PRN
  Administered 2011-11-27: 5 mg via INTRAVENOUS

## 2011-11-27 MED ORDER — MAGIC MOUTHWASH
15.0000 mL | Freq: Four times a day (QID) | ORAL | Status: DC | PRN
  Filled 2011-11-27: qty 15

## 2011-11-27 MED ORDER — IOHEXOL 300 MG/ML  SOLN
100.0000 mL | Freq: Once | INTRAMUSCULAR | Status: AC | PRN
  Administered 2011-11-27: 100 mL via INTRAVENOUS

## 2011-11-27 MED ORDER — CHLORHEXIDINE GLUCONATE 4 % EX LIQD
1.0000 "application " | Freq: Once | CUTANEOUS | Status: DC
  Filled 2011-11-27: qty 15

## 2011-11-27 MED ORDER — TOPIRAMATE 25 MG PO TABS: 100.0000 mg | ORAL_TABLET | Freq: Every day | ORAL | Status: DC

## 2011-11-27 MED ORDER — ONDANSETRON HCL 4 MG PO TABS: 4.0000 mg | ORAL_TABLET | Freq: Four times a day (QID) | ORAL | Status: DC | PRN

## 2011-11-27 MED ORDER — EPINEPHRINE 0.3 MG/0.3ML IJ DEVI: 0.3000 mg | Freq: Once | INTRAMUSCULAR | Status: AC | PRN

## 2011-11-27 MED ORDER — ONDANSETRON HCL 4 MG/2ML IJ SOLN: 4.0000 mg | Freq: Four times a day (QID) | INTRAMUSCULAR | Status: DC | PRN

## 2011-11-27 MED ORDER — DULOXETINE HCL 60 MG PO CPEP
60.0000 mg | ORAL_CAPSULE | Freq: Every day | ORAL | Status: DC
  Filled 2011-11-27 (×2): qty 1

## 2011-11-27 MED ORDER — SODIUM CHLORIDE 0.9 % IV BOLUS (SEPSIS)
1000.0000 mL | Freq: Once | INTRAVENOUS | Status: AC
  Administered 2011-11-27: 1000 mL via INTRAVENOUS

## 2011-11-27 MED ORDER — DIPHENHYDRAMINE HCL 50 MG/ML IJ SOLN: 12.5000 mg | Freq: Four times a day (QID) | INTRAMUSCULAR | Status: DC | PRN

## 2011-11-27 MED ORDER — SUMATRIPTAN SUCCINATE 6 MG/0.5ML ~~LOC~~ SOLN: 6.0000 mg | SUBCUTANEOUS | Status: DC | PRN

## 2011-11-27 MED ORDER — MOMETASONE FURO-FORMOTEROL FUM 100-5 MCG/ACT IN AERO: 2.0000 | INHALATION_SPRAY | Freq: Two times a day (BID) | RESPIRATORY_TRACT | Status: DC

## 2011-11-27 MED ORDER — PHENYLEPHRINE HCL 10 MG/ML IJ SOLN
INTRAMUSCULAR | Status: DC | PRN
  Administered 2011-11-27 (×2): 80 ug via INTRAVENOUS
  Administered 2011-11-27: 40 ug via INTRAVENOUS
  Administered 2011-11-27 (×2): 80 ug via INTRAVENOUS

## 2011-11-27 MED ORDER — GUAIFENESIN-DM 100-10 MG/5ML PO SYRP: 15.0000 mL | ORAL_SOLUTION | ORAL | Status: DC | PRN

## 2011-11-27 MED ORDER — FENTANYL CITRATE 0.05 MG/ML IJ SOLN
25.0000 ug | INTRAMUSCULAR | Status: DC | PRN
  Administered 2011-11-28 (×2): 50 ug via INTRAVENOUS

## 2011-11-27 MED ORDER — SODIUM CHLORIDE 0.9 % IV SOLN
Freq: Once | INTRAVENOUS | Status: AC
  Administered 2011-11-27: 15:00:00 via INTRAVENOUS
  Filled 2011-11-27: qty 50

## 2011-11-27 MED ORDER — ROCURONIUM BROMIDE 100 MG/10ML IV SOLN
INTRAVENOUS | Status: DC | PRN
  Administered 2011-11-27: 40 mg via INTRAVENOUS

## 2011-11-27 MED ORDER — ALUM & MAG HYDROXIDE-SIMETH 200-200-20 MG/5ML PO SUSP: 30.0000 mL | Freq: Four times a day (QID) | ORAL | Status: DC | PRN

## 2011-11-27 MED ORDER — LACTATED RINGERS IV SOLN
INTRAVENOUS | Status: DC
Start: 2011-11-28 — End: 2011-11-28
  Administered 2011-11-27: via INTRAVENOUS

## 2011-11-27 MED ORDER — LACTATED RINGERS IV BOLUS (SEPSIS): 1000.0000 mL | Freq: Once | INTRAVENOUS | Status: DC

## 2011-11-27 MED ORDER — LACTATED RINGERS IR SOLN
Status: DC | PRN
  Administered 2011-11-27: 3000 mL

## 2011-11-27 MED ORDER — LIDOCAINE HCL (CARDIAC) 20 MG/ML IV SOLN
INTRAVENOUS | Status: DC | PRN
  Administered 2011-11-27: 50 mg via INTRAVENOUS

## 2011-11-27 MED ORDER — FLORA-Q PO CAPS
1.0000 | ORAL_CAPSULE | Freq: Every day | ORAL | Status: DC
  Filled 2011-11-27 (×2): qty 1

## 2011-11-27 MED ORDER — DICLOFENAC EPOLAMINE 1.3 % TD PTCH
1.0000 | MEDICATED_PATCH | Freq: Two times a day (BID) | TRANSDERMAL | Status: DC
  Filled 2011-11-27 (×3): qty 1

## 2011-11-27 MED ORDER — EPINEPHRINE 0.3 MG/0.3ML IJ DEVI: 0.3000 mg | Freq: Once | INTRAMUSCULAR | Status: DC

## 2011-11-27 MED ORDER — PROMETHAZINE HCL 25 MG/ML IJ SOLN
12.5000 mg | Freq: Four times a day (QID) | INTRAMUSCULAR | Status: DC | PRN
Start: 2011-11-27 — End: 2011-11-28
  Filled 2011-11-27: qty 1

## 2011-11-27 SURGICAL SUPPLY — 37 items
APPLIER CLIP 5 13 M/L LIGAMAX5 (MISCELLANEOUS)
APPLIER CLIP ROT 10 11.4 M/L (STAPLE)
CANISTER SUCTION 2500CC (MISCELLANEOUS) ×2 IMPLANT
CLIP APPLIE 5 13 M/L LIGAMAX5 (MISCELLANEOUS) IMPLANT
CLIP APPLIE ROT 10 11.4 M/L (STAPLE) IMPLANT
CLOTH BEACON ORANGE TIMEOUT ST (SAFETY) ×2 IMPLANT
CUTTER FLEX LINEAR 45M (STAPLE) ×2 IMPLANT
DECANTER SPIKE VIAL GLASS SM (MISCELLANEOUS) ×2 IMPLANT
DRAPE LAPAROSCOPIC ABDOMINAL (DRAPES) ×2 IMPLANT
DRSG TEGADERM 2-3/8X2-3/4 SM (GAUZE/BANDAGES/DRESSINGS) ×4 IMPLANT
DRSG TEGADERM 4X4.75 (GAUZE/BANDAGES/DRESSINGS) IMPLANT
ELECT REM PT RETURN 9FT ADLT (ELECTROSURGICAL) ×2
ELECTRODE REM PT RTRN 9FT ADLT (ELECTROSURGICAL) ×1 IMPLANT
ENDOLOOP SUT PDS II  0 18 (SUTURE)
ENDOLOOP SUT PDS II 0 18 (SUTURE) IMPLANT
GLOVE BIOGEL PI IND STRL 7.0 (GLOVE) ×1 IMPLANT
GLOVE BIOGEL PI INDICATOR 7.0 (GLOVE) ×1
GLOVE ECLIPSE 8.0 STRL XLNG CF (GLOVE) ×2 IMPLANT
GLOVE INDICATOR 8.0 STRL GRN (GLOVE) ×4 IMPLANT
GOWN STRL NON-REIN LRG LVL3 (GOWN DISPOSABLE) ×2 IMPLANT
GOWN STRL REIN XL XLG (GOWN DISPOSABLE) ×4 IMPLANT
KIT BASIN OR (CUSTOM PROCEDURE TRAY) ×2 IMPLANT
NS IRRIG 1000ML POUR BTL (IV SOLUTION) ×2 IMPLANT
PENCIL BUTTON HOLSTER BLD 10FT (ELECTRODE) ×2 IMPLANT
POUCH SPECIMEN RETRIEVAL 10MM (ENDOMECHANICALS) IMPLANT
RELOAD 45 VASCULAR/THIN (ENDOMECHANICALS) IMPLANT
RELOAD STAPLE TA45 3.5 REG BLU (ENDOMECHANICALS) ×2 IMPLANT
SET IRRIG TUBING LAPAROSCOPIC (IRRIGATION / IRRIGATOR) ×2 IMPLANT
SOLUTION ANTI FOG 6CC (MISCELLANEOUS) ×2 IMPLANT
SUT MNCRL AB 4-0 PS2 18 (SUTURE) ×2 IMPLANT
TOWEL OR 17X26 10 PK STRL BLUE (TOWEL DISPOSABLE) ×2 IMPLANT
TRAY FOLEY CATH 14FRSI W/METER (CATHETERS) ×2 IMPLANT
TRAY LAP CHOLE (CUSTOM PROCEDURE TRAY) ×2 IMPLANT
TROCAR XCEL BLADELESS 5X75MML (TROCAR) ×2 IMPLANT
TROCAR Z-THREAD FIOS 12X100MM (TROCAR) ×2 IMPLANT
TROCAR Z-THREAD FIOS 5X100MM (TROCAR) ×2 IMPLANT
TUBING INSUFFLATION 10FT LAP (TUBING) ×2 IMPLANT

## 2011-11-27 NOTE — ED Provider Notes (Signed)
History     CSN: [id number]  Arrival date & time 11/27/11  1123   First MD Initiated Contact with Patient 11/27/11 1321      Chief Complaint  Patient presents with  . Abdominal Pain    generalized "severe pain" since 4pm yesterday.  sent by Dr. Loreta Ave  . Nausea  . Emesis    (Consider location/radiation/quality/duration/timing/severity/associated sxs/prior treatment) HPI Comments: Pt also has migraine HA which began gradually and consistent with prior HA.  No neuro sx or associated vision changes  Patient is a 66 y.o. female presenting with abdominal pain. The history is provided by the patient. No language interpreter was used.  Abdominal Pain The primary symptoms of the illness include abdominal pain, fever (subjective), nausea and vomiting. The primary symptoms of the illness do not include fatigue, shortness of breath, diarrhea or dysuria. The current episode started yesterday. The onset of the illness was gradual. The problem has been gradually worsening.  The abdominal pain began yesterday. The pain came on gradually. The abdominal pain has been gradually worsening since its onset. The abdominal pain is generalized. The abdominal pain does not radiate. The severity of the abdominal pain is 10/10. The abdominal pain is relieved by nothing. The abdominal pain is exacerbated by movement and certain positions.  Nausea began yesterday. The nausea is associated with eating. The nausea is exacerbated by food.  The vomiting began today. Vomiting occurs 2 to 5 times per day. The emesis contains stomach contents.  The patient states that she believes she is currently not pregnant. Symptoms associated with the illness do not include constipation, urgency, frequency or back pain.    Past Medical History  Diagnosis Date  . Hyperthyroidism   . Toxic goiter     History reviewed. No pertinent past surgical history.  No family history on file.  History  Substance Use Topics  . Smoking  status: Never Smoker   . Smokeless tobacco: Not on file  . Alcohol Use:     OB History    Grav Para Term Preterm Abortions TAB SAB Ect Mult Living                  Review of Systems  Constitutional: Positive for fever (subjective), activity change and appetite change. Negative for fatigue.  HENT: Positive for congestion. Negative for sore throat, rhinorrhea, neck pain and neck stiffness.   Respiratory: Positive for cough. Negative for shortness of breath.   Cardiovascular: Negative for chest pain and palpitations.  Gastrointestinal: Positive for nausea, vomiting and abdominal pain. Negative for diarrhea and constipation.  Genitourinary: Negative for dysuria, urgency, frequency and flank pain.  Musculoskeletal: Negative for myalgias, back pain and arthralgias.  Neurological: Positive for headaches. Negative for dizziness, weakness, light-headedness and numbness.  All other systems reviewed and are negative.    Allergies  Bee; Etodolac; Morphine; Propoxyphene n-acetaminophen; and Penicillins  Home Medications   Current Outpatient Rx  Name Route Sig Dispense Refill  . ASPIRIN EFFERVESCENT 325 MG PO TBEF Oral Take 325 mg by mouth every 6 (six) hours as needed.    [name] CLONAZEPAM 0.5 MG PO TABS  0.5 mg daily as needed. For agitation or anxiety    . CYCLOBENZAPRINE HCL 10 MG PO TABS Oral Take 10 mg by mouth at bedtime.    [name] DICLOFENAC EPOLAMINE 1.3 % TD PTCH Transdermal Place 1 patch onto the skin 2 (two) times daily. Apply to back    . DULOXETINE HCL 60 MG PO CPEP Oral Take  60 mg by mouth daily.    [name] EPINEPHRINE 0.3 MG/0.3ML IJ DEVI Intramuscular Inject 0.3 mg into the muscle once.    [name] GABAPENTIN 800 MG PO TABS Oral Take 800 mg by mouth 2 (two) times daily.    [name] LEVOTHYROXINE SODIUM 25 MCG PO TABS Oral Take 25 mcg by mouth daily.    [name] METOPROLOL SUCCINATE ER 50 MG PO TB24 Oral Take 50 mg by mouth every other day.    . MOMETASONE FURO-FORMOTEROL FUM 100-5 MCG/ACT IN AERO Inhalation  Inhale 2 puffs into the lungs 2 (two) times daily.    . OXYCODONE-ACETAMINOPHEN 10-325 MG PO TABS Oral Take 1 tablet by mouth 2 (two) times daily.    [name] PROMETHAZINE HCL 25 MG PO TABS Oral Take 25 mg by mouth every 8 (eight) hours as needed. For nausea    . SUMATRIPTAN SUCCINATE 6 MG/0.5ML [place] SOLN Subcutaneous Inject 6 mg into the skin every 2 (two) hours as needed.    . TOPIRAMATE 100 MG PO TABS Oral Take 100 mg by mouth at bedtime. For migraine prevention      BP 122/76  Pulse 85  Temp 99 F (37.2 C)  Resp 19  SpO2 93%  Physical Exam  Nursing note and vitals reviewed. Constitutional: She is oriented to person, place, and time. She appears well-developed and well-nourished.       Appears uncomfortable  HENT:  Head: Normocephalic and atraumatic.  Mouth/Throat: Oropharynx is clear and moist.  Eyes: Conjunctivae and EOM are normal. Pupils are equal, round, and reactive to light.       Mild photophobia  Neck: Normal range of motion. Neck supple.  Cardiovascular: Normal rate, regular rhythm, normal heart sounds and intact distal pulses.  Exam reveals no gallop and no friction rub.   No murmur heard. Pulmonary/Chest: Effort normal and breath sounds normal. No respiratory distress. She exhibits no tenderness.  Abdominal: Soft. Bowel sounds are normal. There is tenderness (RLQ pain ). There is no rebound and no guarding.       Morbidly obese  Musculoskeletal: Normal range of motion. She exhibits no tenderness.  Neurological: She is alert and oriented to person, place, and time. No cranial nerve deficit.  Skin: Skin is warm and dry. No rash noted.    ED Course  Procedures (including critical care time)   Labs Reviewed  CBC  DIFFERENTIAL  COMPREHENSIVE METABOLIC PANEL  LIPASE, BLOOD  URINALYSIS, ROUTINE W REFLEX MICROSCOPIC  LACTIC ACID, PLASMA   Dg Chest 2 View  11/27/2011  *RADIOLOGY REPORT*  Clinical Data: Nausea, vomiting, cough  CHEST - 2 VIEW  Comparison: 12/18/2006   Findings: Borderline cardiomegaly but normal vascularity.  Negative for edema, CHF, collapse, consolidation, pneumonia, effusion or pneumothorax.  Trachea midline.  Degenerative changes of the spine. Lateral view is limited with motion artifact.  Cholecystectomy noted.  IMPRESSION: Borderline cardiomegaly without CHF or pneumonia.  Stable exam  Original Report Authenticated By: Judie Petit. Ruel Favors, M.D.     No diagnosis found.    MDM  Pain, nausea, vomiting. She has no diarrhea. She has right lower quadrant abdominal pain therefore a CT was ordered to rule out appendicitis. She's also had a cough for the past several days for chest x-ray was performed and rule out pneumonia. The remainder of her workup thus far still pending. IV fluids, antiemetics, pain medication was administered. On arrival the patient specifically requested dilaudid and Phenergan for her symptoms control.  Care transitioned to my colleague dr hunt.  If workup negative and pain control can be dc home        Dayton Bailiff, MD 11/27/11 [phone]

## 2011-11-27 NOTE — Preoperative (Signed)
Beta Blockers   Reason not to administer Beta Blockers:Not Applicable 

## 2011-11-27 NOTE — Transfer of Care (Signed)
Immediate Anesthesia Transfer of Care Note  Patient: Hayley [name]  Procedure(s) Performed: Procedure(s) (LRB): APPENDECTOMY LAPAROSCOPIC (N/A)  Patient Location: PACU  Anesthesia Type: General  Level of Consciousness: sedated and responds to stimulation  Airway & Oxygen Therapy: Patient Spontanous Breathing, with nasal trumpet  Post-op Assessment: Report given to PACU RN, Post -op Vital signs reviewed and unstable, Anesthesiologist notified and Patient moving all extremities, pt with low O2 sats (pt has history of low O2 sats) attempted placing patient on bipap with full mask and nasal mask, pt did not tolerate bipap, pt placed back on face mask, O2 sats running 89-91% on face mask with 10L  Post vital signs: Reviewed and stable  Complications: No apparent anesthesia complications

## 2011-11-27 NOTE — Anesthesia Procedure Notes (Signed)
Procedure Name: Intubation Date/Time: 11/27/2011 9:29 PM Performed by: [place], Deward Sebek C Pre-anesthesia Checklist: Patient identified, Emergency Drugs available, Suction available and Patient being monitored Patient Re-evaluated:Patient Re-evaluated prior to inductionOxygen Delivery Method: Circle system utilized Preoxygenation: Pre-oxygenation with 100% oxygen Intubation Type: IV induction, Rapid sequence and Cricoid Pressure applied Laryngoscope Size: Mac and 3 Grade View: Grade I Tube type: Oral Tube size: 7.5 mm Number of attempts: 1 Airway Equipment and Method: Stylet Placement Confirmation: ETT inserted through vocal cords under direct vision,  positive ETCO2,  CO2 detector and breath sounds checked- equal and bilateral Secured at: 21 cm Tube secured with: Tape Dental Injury: Teeth and Oropharynx as per pre-operative assessment

## 2011-11-27 NOTE — ED Provider Notes (Signed)
Patient had return of CT scan showing acute appendicitis with likely perforation. Patient was reassessed by myself. She did have right lower quadrant pain on my exam. Consult was placed to Gen. Surgery.  I spoke with Dr. Michaell Cowing who will see the patient.  Patient was seen by Dr. gross and admitted for further management.  Diagnosis: Acute appendicitis  Cyndra Numbers, MD 11/28/11 [phone]

## 2011-11-27 NOTE — Op Note (Signed)
11/27/2011  10:57 PM  PATIENT:  Hayley [name]  66 y.o. female  Patient Care Team: Juline Patch, MD as PCP - General (Internal Medicine) Charna Elizabeth, MD as Consulting Physician (Gastroenterology)  PRE-OPERATIVE DIAGNOSIS:  appendicitis  POST-OPERATIVE DIAGNOSIS:  Acute appendicitis with phlegmon, probable perforated  PROCEDURE:  Procedure(s): APPENDECTOMY LAPAROSCOPIC  SURGEON:  Surgeon(s): Ardeth Sportsman, MD  ASSISTANTS: none   ANESTHESIA:   local and general  EBL:  Total I/O In: 1000 [I.V.:1000] Out: 350 [Urine:350]  DRAINS: none   SPECIMEN:  Source of Specimen:  Appendix  DISPOSITION OF SPECIMEN:  PATHOLOGY  COUNTS:  YES  PLAN OF CARE: Admit for overnight observation  PATIENT DISPOSITION:  PACU - hemodynamically stable.  Delay start of Pharmacological VTE agent (>24hrs) due to surgical blood loss or risk of bleeding:  no  INDICATIONS: This is a morbidly obese female who yesterday starting having diffuse abdominal pain. It became more intense and went to the right lower quadrant. She saw her gastroenterologist. Dr. Loreta Ave recommended ER evaluation. Initially symptoms were vague but then became more focal in the right lower quadrant. A CAT scan was performed and showed appendicitis. Recommendation is made for consideration of surgery. Technique risks benefits alternatives were discussed in detail. Questions answered the patient her husband agree to see.  Operative findings: She had a retrocolic appendix extending up towards the hepatic flexure. It was very inflamed. It was densely adhered to the right mesocolon. There is a moderate amount of inflammation but no definite abscess. It was a focal, primarily retrocolic, peritonitis  DICTATION:  Informed consent was confirmed.  The patient underwent general anaesthesia without difficulty.  The patient was positioned appropriately.  VTE prevention in place.  The patient's abdomen was clipped, prepped, & draped in a sterile  fashion.  Surgical timeout confirmed our plan.  The patient was positioned in reverse Trendelenburg.  Abdominal entry was gained using optical entry technique in the umbilicus with towel clamp on the umbilical stalk for countertension.  Entry was clean.  I induced carbon dioxide insufflation.  Camera inspection revealed no injury.  Extra ports were carefully placed under direct laparoscopic visualization at the left flank and LLQ.  There is no major peritonitis visible in the anterior abdomen or pelvis her left side. However she did have a dilated right colon. I was able to mobilize the terminal ileum mesentery and right colon mesentery in a lateral to medial fashion using controlled harmonic dissection. This was source of obvious inflammation. I could find the terminal ileum in the fold of Treves. There was some inflammation of the fold of Treevs. The terminal ileum looked normal. No evidence of any Crohn's ileitis.  Upon rolling the colon over, the posterior ascending colon mesentery was inflamed a purple tubular mass consistent with an inflamed appendix. Was able free often create a window at the base of the mesoappendix. I stapled off the appendix off the cecum taking a healthy cuff of cecum using a laparoscopic stapler.  I used a harmonic dissection to free the mesoappendix off the right colon mesentery and terminal ileum mesentery. Planes were a little challenging at times but I was able to avoid injuring the right mesocolon. Placed in an Endo Catch bag and removed it out the left lower quadrant 12 mm port. I placed a 0 Vicryl stitch around the 12 mm wound using a suture passer under laparoscopic visualization.  I replaced the port through the wound.  Headache of his urination over 6 L of saline.  I irrigated the pelvis and right side the abdomen especially. Hemostasis was good the staple line. Hemostasis good of the mesial appendix. I ran the small bowel proximally and saw no Meckel's diverticulum.  Inspection of the rest abdomen revealed thickened greater omentum and in general moderate amount of mesenteric fat but no evidence of any major peritonitis inflammation elsewhere. Hemostasis is good.  I evacuated carbon dioxide. I removed the ports. I tied the fascia stitch down. Closed the skin with Monocryl stitch. Sterile dressing was applied. Patient being extubated.  I discussed the findings and the patient's husband and her friend who is a doctor. Questions answered. They expressed understanding and appreciation.

## 2011-11-27 NOTE — H&P (Signed)
Hayley [name] is an 66 y.o. female.    Patient Care Team: Juline Patch, MD as PCP - General (Internal Medicine) Charna Elizabeth, MD as Consulting Physician (Gastroenterology)  Chief Complaint: Abd pain.  Probable appendicitis  HPI: Reason for visit: Probable appendicitis.  Patient is a deformity obese female with numerous health issues. She began to have abdominal pain yesterday. She her husband saw Dr. Loreta Ave, her gastroenterologist, this morning. There was concern. She was sent to the emergency room. She's been in the ER all day. There has been difficulty with IV access. The pain has gradually migrated to the right lower side. She has decreased appetite. She has nausea. Question of vomiting.  No sick contacts. No travel history. Not really had pain like this before. She has reflux for which Dr. Loreta Ave helps monitor and is stable. This does not feel like that. Normally has a bowel movement every day. No inflammatory bowel disease. Some irritable bowel symptoms but not severe recently. Colonoscopies last year. She saw Dr. Marchelle Gearing for some dyspnea on exertion. Question of COPD. I think she is supposed to be oxygen dependent but has not been doing that.  Based on concerns she had a CAT scan. It showed appendicitis. Dr. Earlie Lou with the [facility] long emergency department requested surgical evaluation  Patient Active Problem List  Diagnoses  . DIABETES MELLITUS, TYPE II  . Obesity, Class III, BMI 40-49.9 (morbid obesity)  . ANEMIA  . ANXIETY DEPRESSION  . DEPRESSION  . MIGRAINE HEADACHE  . NEUROPATHY  . HEMORRHOIDS  . GERD  . DIVERTICULOSIS, COLON  . IBS  . CHOLELITHIASIS  . RENAL CALCULUS  . DIAPHORESIS  . DYSPNEA  . HYPOXEMIA  . Appendicitis, acute, with peritonitis  . Spasmodic dysphonia     Past Medical History  Diagnosis Date  . Hyperthyroidism   . Toxic goiter   . Reflux   . Colon polyps   . Osteoarthritis     in back  . Migraines   . Disorder of vocal cords     spasmotic  dysphonia    Past Surgical History  Procedure Date  . Abdominal hysterectomy   . Zenkers diverticulum   . Kidney stone surgery     History reviewed. No pertinent family history. Social History:  reports that she has never smoked. She has never used smokeless tobacco. She reports that she does not drink alcohol or use illicit drugs.  Allergies:  Allergies  Allergen Reactions  . Bee Anaphylaxis  . Etodolac Nausea And Vomiting  . Morphine Other (See Comments)    Hallucinations  . Propoxyphene N-Acetaminophen Nausea And Vomiting  . Penicillins Rash    Medications Prior to Admission  Medication Dose Route Frequency Provider Last Rate Last Dose  . HYDROmorphone (DILAUDID) injection 1 mg  1 mg Intravenous Once Dayton Bailiff, MD   1 mg at 11/27/11 1342  . HYDROmorphone (DILAUDID) injection 1 mg  1 mg Intravenous Once Dayton Bailiff, MD   1 mg at 11/27/11 1630  . iohexol (OMNIPAQUE) 300 MG/ML solution 100 mL  100 mL Intravenous Once PRN Medication Radiologist, MD   100 mL at 11/27/11 1815  . ondansetron (ZOFRAN) injection 4 mg  4 mg Intravenous Once Felisa Bonier, MD   4 mg at 11/27/11 1246  . sodium chloride 0.9 % 50 mL with promethazine (PHENERGAN) 25 mg infusion   Intravenous Once Dayton Bailiff, MD 100 mL/hr at 11/27/11 1438    . sodium chloride 0.9 % bolus 1,000 mL  1,000 mL Intravenous Once Dayton Bailiff, MD   1,000 mL at 11/27/11 1341  . DISCONTD: promethazine (PHENERGAN) injection 25 mg  25 mg Intravenous Once Dayton Bailiff, MD       No current outpatient prescriptions on file as of 11/27/2011.    Results for orders placed during the hospital encounter of 11/27/11 (from the past 48 hour(s))  CBC     Status: Abnormal   Collection Time   11/27/11  1:31 PM      Component Value Range Comment   WBC 12.9 (*) 4.0 - 10.5 (K/uL)    RBC 4.77  3.87 - 5.11 (MIL/uL)    Hemoglobin 13.3  12.0 - 15.0 (g/dL)    HCT 62.1  30.8 - 65.7 (%)    MCV 88.1  78.0 - 100.0 (fL)    MCH 27.9  26.0 - 34.0 (pg)      MCHC 31.7  30.0 - 36.0 (g/dL)    RDW 84.6  96.2 - 95.2 (%)    Platelets 238  150 - 400 (K/uL)   DIFFERENTIAL     Status: Abnormal   Collection Time   11/27/11  1:31 PM      Component Value Range Comment   Neutrophils Relative 83 (*) 43 - 77 (%)    Neutro Abs 10.7 (*) 1.7 - 7.7 (K/uL)    Lymphocytes Relative 8 (*) 12 - 46 (%)    Lymphs Abs 1.1  0.7 - 4.0 (K/uL)    Monocytes Relative 8  3 - 12 (%)    Monocytes Absolute 1.0  0.1 - 1.0 (K/uL)    Eosinophils Relative 1  0 - 5 (%)    Eosinophils Absolute 0.1  0.0 - 0.7 (K/uL)    Basophils Relative 0  0 - 1 (%)    Basophils Absolute 0.0  0.0 - 0.1 (K/uL)   COMPREHENSIVE METABOLIC PANEL     Status: Abnormal   Collection Time   11/27/11  1:31 PM      Component Value Range Comment   Sodium 137  135 - 145 (mEq/L)    Potassium 3.8  3.5 - 5.1 (mEq/L)    Chloride 106  96 - 112 (mEq/L)    CO2 25  19 - 32 (mEq/L)    Glucose, Bld 127 (*) 70 - 99 (mg/dL)    BUN 5 (*) 6 - 23 (mg/dL)    Creatinine, Ser 8.41  0.50 - 1.10 (mg/dL)    Calcium 8.8  8.4 - 10.5 (mg/dL)    Total Protein 7.1  6.0 - 8.3 (g/dL)    Albumin 3.5  3.5 - 5.2 (g/dL)    AST 14  0 - 37 (U/L)    ALT 8  0 - 35 (U/L)    Alkaline Phosphatase 62  39 - 117 (U/L)    Total Bilirubin 0.5  0.3 - 1.2 (mg/dL)    GFR calc non Af Amer >90  >90 (mL/min)    GFR calc Af Amer >90  >90 (mL/min)   LIPASE, BLOOD     Status: Normal   Collection Time   11/27/11  1:31 PM      Component Value Range Comment   Lipase 35  11 - 59 (U/L)   LACTIC ACID, PLASMA     Status: Normal   Collection Time   11/27/11  3:46 PM      Component Value Range Comment   Lactic Acid, Venous 0.8  0.5 - 2.2 (mmol/L)   URINALYSIS, ROUTINE W REFLEX  MICROSCOPIC     Status: Normal   Collection Time   11/27/11  4:29 PM      Component Value Range Comment   Color, Urine YELLOW  YELLOW     APPearance CLEAR  CLEAR     Specific Gravity, Urine 1.015  1.005 - 1.030     pH 6.0  5.0 - 8.0     Glucose, UA NEGATIVE  NEGATIVE (mg/dL)     Hgb urine dipstick NEGATIVE  NEGATIVE     Bilirubin Urine NEGATIVE  NEGATIVE     Ketones, ur NEGATIVE  NEGATIVE (mg/dL)    Protein, ur NEGATIVE  NEGATIVE (mg/dL)    Urobilinogen, UA 0.2  0.0 - 1.0 (mg/dL)    Nitrite NEGATIVE  NEGATIVE     Leukocytes, UA NEGATIVE  NEGATIVE  MICROSCOPIC NOT DONE ON URINES WITH NEGATIVE PROTEIN, BLOOD, LEUKOCYTES, NITRITE, OR GLUCOSE <1000 mg/dL.   Dg Chest 2 View  11/27/2011  *RADIOLOGY REPORT*  Clinical Data: Nausea, vomiting, cough  CHEST - 2 VIEW  Comparison: 12/18/2006  Findings: Borderline cardiomegaly but normal vascularity.  Negative for edema, CHF, collapse, consolidation, pneumonia, effusion or pneumothorax.  Trachea midline.  Degenerative changes of the spine. Lateral view is limited with motion artifact.  Cholecystectomy noted.  IMPRESSION: Borderline cardiomegaly without CHF or pneumonia.  Stable exam  Original Report Authenticated By: Judie Petit. Ruel Favors, M.D.   Ct Abdomen Pelvis W Contrast  11/27/2011  *RADIOLOGY REPORT*  Clinical Data: Right lower quadrant pain with nausea and vomiting.  CT ABDOMEN AND PELVIS WITH CONTRAST  Technique:  Multidetector CT imaging of the abdomen and pelvis was performed following the standard protocol during bolus administration of intravenous contrast.  Contrast: OMNIPAQUE IOHEXOL 300 MG/ML IJ SOLN  Comparison: 08/15/2010  Findings: There is evidence of acute appendicitis.  Focal calcified appendicolith is present near the proximal origin of the appendix. The distal appendix is severely edematous measuring approximately 18 mm in greatest diameter.  The appendix ascends posterior to the cecum.  The colon is very redundant in the right abdomen.  Severe inflammatory changes are seen around the appendix with adjacent fluid present.  No focal abscess is currently present.  Based on CT appearance, the appendix may be perforated.  Other bowel loops are unremarkable.  There is significant atelectasis at both lung bases.  The  gallbladder is been removed. The liver, pancreas, spleen, adrenal glands and kidneys are unremarkable.  No hernias are identified.  The bladder is within normal limits.  Bony structures show degenerative changes of the spine.  IMPRESSION: Acute appendicitis with calcified proximal appendicolith and severely inflamed appendix present which may be a ruptured based on CT appearance.  No focal abscess is identified.  Original Report Authenticated By: Reola Calkins, M.D.    Review of Systems  Constitutional: Negative for fever, chills, weight loss and malaise/fatigue.  HENT: Negative for neck pain and ear discharge.   Eyes: Negative for double vision and photophobia.  Respiratory: Positive for shortness of breath. Negative for cough, hemoptysis and sputum production.   Cardiovascular: Positive for orthopnea. Negative for chest pain, palpitations and leg swelling.  Gastrointestinal: Positive for heartburn, nausea, vomiting and abdominal pain. Negative for diarrhea, constipation, blood in stool and melena.       No personal nor family history of GI/colon cancer, inflammatory bowel disease, irritable bowel syndrome, allergy such as Celiac Sprue, dietary/dairy problems, colitis, ulcers nor gastritis.    No recent sick contacts/gastroenteritis.  No travel outside the country.  No  changes in diet.    Genitourinary: Negative for dysuria, urgency and flank pain.  Musculoskeletal: Negative for myalgias and falls.  Skin: Negative for itching and rash.  Neurological: Positive for headaches. Negative for dizziness and speech change.  Endo/Heme/Allergies: Negative for polydipsia. Does not bruise/bleed easily.  Psychiatric/Behavioral: Negative for suicidal ideas, hallucinations and substance abuse.    Blood pressure 122/76, pulse 85, temperature 100.1 F (37.8 C), temperature source Oral, resp. rate 19, SpO2 93.00%. Physical Exam  Constitutional: She is oriented to person, place, and time. She appears  well-developed and well-nourished. She appears lethargic. She is cooperative. She is easily aroused. No distress.  HENT:  Head: Normocephalic and atraumatic.  Mouth/Throat: No oropharyngeal exudate.  Eyes: Conjunctivae are normal. Pupils are equal, round, and reactive to light.  Neck: Normal range of motion. Neck supple. No tracheal deviation present.  Cardiovascular: Normal rate, regular rhythm and intact distal pulses.   Respiratory: Effort normal. She has no wheezes. She has rales. She exhibits no tenderness.  GI: Soft. She exhibits distension. She exhibits no pulsatile liver, no ascites and no mass. There is no hepatosplenomegaly. There is tenderness in the right lower quadrant. There is rebound, guarding and tenderness at McBurney's point. There is no rigidity, no CVA tenderness and negative Murphy's sign. No hernia. Hernia confirmed negative in the ventral area, confirmed negative in the right inguinal area and confirmed negative in the left inguinal area.  Genitourinary: No breast swelling, discharge or bleeding. No vaginal discharge found.  Musculoskeletal: Normal range of motion. She exhibits no tenderness.  Neurological: She is oriented to person, place, and time and easily aroused. She appears lethargic. No cranial nerve deficit. She exhibits normal muscle tone. Coordination normal.  Skin: Skin is warm and dry. She is not diaphoretic. No erythema.  Psychiatric: Her behavior is normal. Judgment and thought content normal. Her affect is labile. Her affect is not angry and not inappropriate. She does not exhibit a depressed mood.     Assessment/Plan Acute appendicitis by history physical and CT scan. Moderate inflammation volume. Concern for perforation.  1. admit  2. IV antibiotics 3.  Diagnostic laparoscopy with appendectomy  The anatomy & physiology of the digestive tract was discussed.  The pathophysiology of appendicitis was discussed.  Natural history risks without surgery was  discussed.   I feel the risks of no intervention will lead to serious problems that outweigh the operative risks; therefore, I recommended diagnostic laparoscopy with removal of appendix to remove the pathology.  Laparoscopic & open techniques were discussed.   I noted a good likelihood this will help address the problem.    Risks such as bleeding, infection, abscess, leak, reoperation, possible ostomy, hernia, heart attack, death, and other risks were discussed.  Goals of post-operative recovery were discussed as well.  We will work to minimize complications.  Questions were answered.  The patient expresses understanding & wishes to proceed with surgery.  4. diabetic control 5 control headache with Imitrex 6.  Chronic pain. -continue chronic pain medications 7.  02 as needed  Next medicine evaluation for numerous health issues  Rechelle Niebla C. 11/27/2011, 7:41 PM

## 2011-11-27 NOTE — ED Notes (Signed)
Patient recently voided, will check back for urine specimen in 30 minutes

## 2011-11-27 NOTE — ED Notes (Signed)
Patient attempted to void on bedpan again. No specimen obtained.

## 2011-11-27 NOTE — Anesthesia Preprocedure Evaluation (Signed)
Anesthesia Evaluation  Patient identified by MRN, date of birth, ID band Patient confused  General Assessment Comment:Poor historian at present  Reviewed: Allergy & Precautions, H&P , NPO status , Patient's Chart, lab work & pertinent test results, reviewed documented beta blocker date and time   History of Anesthesia Complications Negative for: history of anesthetic complications  Airway Mallampati: III TM Distance: >3 FB Neck ROM: Full  Mouth opening: Limited Mouth Opening  Dental  (+) Teeth Intact and Dental Advisory Given   Pulmonary shortness of breath and with exertion, sleep apnea and Continuous Positive Airway Pressure Ventilation ,  breath sounds clear to auscultation  Pulmonary exam normal       Cardiovascular Exercise Tolerance: Poor hypertension, Pt. on medications and Pt. on home beta blockers Rhythm:Regular Rate:Normal     Neuro/Psych  Headaches, PSYCHIATRIC DISORDERS Anxiety Depression Spasmodic dysphonia  Neuromuscular disease    GI/Hepatic Neg liver ROS, GERD-  Medicated,  Endo/Other  Diabetes mellitus-, Type 2, Oral Hypoglycemic AgentsHyperthyroidism Morbid obesity  Renal/GU negative Renal ROS  negative genitourinary   Musculoskeletal negative musculoskeletal ROS (+)   Abdominal (+) + obese,  Abdomen: tender.    Peds  Hematology negative hematology ROS (+)   Anesthesia Other Findings   Reproductive/Obstetrics                           Anesthesia Physical Anesthesia Plan  ASA: III and Emergent  Anesthesia Plan: General   Post-op Pain Management:    Induction: Intravenous  Airway Management Planned: Oral ETT  Additional Equipment:   Intra-op Plan:   Post-operative Plan: Extubation in OR  Informed Consent: I have reviewed the patients History and Physical, chart, labs and discussed the procedure including the risks, benefits and alternatives for the proposed  anesthesia with the patient or authorized representative who has indicated his/her understanding and acceptance.   Dental advisory given  Plan Discussed with: CRNA  Anesthesia Plan Comments:         Anesthesia Quick Evaluation

## 2011-11-27 NOTE — ED Notes (Signed)
Patient complains of general abdominal pain that started yesterday afternoon and has remained persistent. Reports emesis is bile colored, denies diarrhea. Also reports that she has developed migraine

## 2011-11-28 DIAGNOSIS — K560 Paralytic ileus: Secondary | ICD-10-CM | POA: Diagnosis not present

## 2011-11-28 DIAGNOSIS — G4733 Obstructive sleep apnea (adult) (pediatric): Secondary | ICD-10-CM | POA: Diagnosis present

## 2011-11-28 DIAGNOSIS — K7689 Other specified diseases of liver: Secondary | ICD-10-CM | POA: Diagnosis present

## 2011-11-28 DIAGNOSIS — J45909 Unspecified asthma, uncomplicated: Secondary | ICD-10-CM | POA: Diagnosis not present

## 2011-11-28 DIAGNOSIS — T81718A Complication of other artery following a procedure, not elsewhere classified, initial encounter: Secondary | ICD-10-CM | POA: Diagnosis not present

## 2011-11-28 DIAGNOSIS — K3580 Unspecified acute appendicitis: Secondary | ICD-10-CM | POA: Diagnosis not present

## 2011-11-28 DIAGNOSIS — R079 Chest pain, unspecified: Secondary | ICD-10-CM | POA: Diagnosis not present

## 2011-11-28 DIAGNOSIS — J387 Other diseases of larynx: Secondary | ICD-10-CM | POA: Diagnosis present

## 2011-11-28 DIAGNOSIS — I10 Essential (primary) hypertension: Secondary | ICD-10-CM | POA: Diagnosis not present

## 2011-11-28 DIAGNOSIS — R1031 Right lower quadrant pain: Secondary | ICD-10-CM | POA: Diagnosis not present

## 2011-11-28 DIAGNOSIS — G4739 Other sleep apnea: Secondary | ICD-10-CM | POA: Diagnosis not present

## 2011-11-28 DIAGNOSIS — R0902 Hypoxemia: Secondary | ICD-10-CM | POA: Diagnosis not present

## 2011-11-28 DIAGNOSIS — R0602 Shortness of breath: Secondary | ICD-10-CM | POA: Diagnosis not present

## 2011-11-28 DIAGNOSIS — F329 Major depressive disorder, single episode, unspecified: Secondary | ICD-10-CM | POA: Diagnosis present

## 2011-11-28 DIAGNOSIS — I2699 Other pulmonary embolism without acute cor pulmonale: Secondary | ICD-10-CM | POA: Diagnosis not present

## 2011-11-28 DIAGNOSIS — R109 Unspecified abdominal pain: Secondary | ICD-10-CM | POA: Diagnosis not present

## 2011-11-28 DIAGNOSIS — R933 Abnormal findings on diagnostic imaging of other parts of digestive tract: Secondary | ICD-10-CM | POA: Diagnosis not present

## 2011-11-28 DIAGNOSIS — K6389 Other specified diseases of intestine: Secondary | ICD-10-CM | POA: Diagnosis not present

## 2011-11-28 DIAGNOSIS — J9600 Acute respiratory failure, unspecified whether with hypoxia or hypercapnia: Secondary | ICD-10-CM | POA: Diagnosis not present

## 2011-11-28 DIAGNOSIS — K3533 Acute appendicitis with perforation and localized peritonitis, with abscess: Secondary | ICD-10-CM | POA: Diagnosis present

## 2011-11-28 DIAGNOSIS — I517 Cardiomegaly: Secondary | ICD-10-CM | POA: Diagnosis not present

## 2011-11-28 DIAGNOSIS — M7989 Other specified soft tissue disorders: Secondary | ICD-10-CM | POA: Diagnosis not present

## 2011-11-28 DIAGNOSIS — G4710 Hypersomnia, unspecified: Secondary | ICD-10-CM | POA: Diagnosis not present

## 2011-11-28 DIAGNOSIS — E119 Type 2 diabetes mellitus without complications: Secondary | ICD-10-CM | POA: Diagnosis not present

## 2011-11-28 DIAGNOSIS — J9819 Other pulmonary collapse: Secondary | ICD-10-CM | POA: Diagnosis not present

## 2011-11-28 DIAGNOSIS — Z6837 Body mass index (BMI) 37.0-37.9, adult: Secondary | ICD-10-CM | POA: Diagnosis not present

## 2011-11-28 DIAGNOSIS — K5730 Diverticulosis of large intestine without perforation or abscess without bleeding: Secondary | ICD-10-CM | POA: Diagnosis present

## 2011-11-28 DIAGNOSIS — R0789 Other chest pain: Secondary | ICD-10-CM | POA: Diagnosis not present

## 2011-11-28 DIAGNOSIS — E876 Hypokalemia: Secondary | ICD-10-CM | POA: Diagnosis not present

## 2011-11-28 DIAGNOSIS — M79609 Pain in unspecified limb: Secondary | ICD-10-CM | POA: Diagnosis not present

## 2011-11-28 DIAGNOSIS — K37 Unspecified appendicitis: Secondary | ICD-10-CM | POA: Diagnosis not present

## 2011-11-28 DIAGNOSIS — R918 Other nonspecific abnormal finding of lung field: Secondary | ICD-10-CM | POA: Diagnosis not present

## 2011-11-28 DIAGNOSIS — R141 Gas pain: Secondary | ICD-10-CM | POA: Diagnosis not present

## 2011-11-28 DIAGNOSIS — F411 Generalized anxiety disorder: Secondary | ICD-10-CM | POA: Diagnosis present

## 2011-11-28 DIAGNOSIS — K219 Gastro-esophageal reflux disease without esophagitis: Secondary | ICD-10-CM | POA: Diagnosis not present

## 2011-11-28 DIAGNOSIS — I808 Phlebitis and thrombophlebitis of other sites: Secondary | ICD-10-CM | POA: Diagnosis not present

## 2011-11-28 LAB — GLUCOSE, CAPILLARY
Glucose-Capillary: 156 mg/dL — ABNORMAL HIGH (ref 70–99)
Glucose-Capillary: 157 mg/dL — ABNORMAL HIGH (ref 70–99)
Glucose-Capillary: 145 mg/dL — ABNORMAL HIGH (ref 70–99)
Glucose-Capillary: 131 mg/dL — ABNORMAL HIGH (ref 70–99)
Glucose-Capillary: 140 mg/dL — ABNORMAL HIGH (ref 70–99)

## 2011-11-28 LAB — HEMOGLOBIN A1C
Hgb A1c MFr Bld: 6.7 % — ABNORMAL HIGH (ref <5.7)
Mean Plasma Glucose: 146 mg/dL — ABNORMAL HIGH (ref <117)

## 2011-11-28 LAB — MRSA PCR SCREENING: MRSA by PCR: NEGATIVE

## 2011-11-28 MED ORDER — PANTOPRAZOLE SODIUM 40 MG IV SOLR
40.0000 mg | INTRAVENOUS | Status: DC
  Administered 2011-11-28 – 2011-12-03 (×6): 40 mg via INTRAVENOUS
  Filled 2011-11-28 (×7): qty 40

## 2011-11-28 MED ORDER — ONDANSETRON HCL 4 MG/2ML IJ SOLN
INTRAMUSCULAR | Status: AC
  Administered 2011-11-28: 4 mg
  Filled 2011-11-28: qty 2

## 2011-11-28 MED ORDER — MOMETASONE FURO-FORMOTEROL FUM 100-5 MCG/ACT IN AERO
2.0000 | INHALATION_SPRAY | Freq: Two times a day (BID) | RESPIRATORY_TRACT | Status: DC
  Administered 2011-11-28 – 2011-12-01 (×4): 2 via RESPIRATORY_TRACT
  Filled 2011-11-28: qty 13

## 2011-11-28 MED ORDER — KCL IN DEXTROSE-NACL 20-5-0.9 MEQ/L-%-% IV SOLN
INTRAVENOUS | Status: DC
  Administered 2011-11-28 (×2): via INTRAVENOUS
  Administered 2011-11-29: 1000 mL via INTRAVENOUS
  Administered 2011-11-29: 16:00:00 via INTRAVENOUS
  Administered 2011-11-30: 100 mL/h via INTRAVENOUS
  Administered 2011-11-30: 03:00:00 via INTRAVENOUS
  Administered 2011-12-01: 100 mL/h via INTRAVENOUS
  Administered 2011-12-01: 05:00:00 via INTRAVENOUS
  Administered 2011-12-02: 1000 mL via INTRAVENOUS
  Administered 2011-12-02 – 2011-12-05 (×3): via INTRAVENOUS
  Filled 2011-11-28 (×24): qty 1000

## 2011-11-28 MED ORDER — OXYCODONE-ACETAMINOPHEN 5-325 MG PO TABS
1.0000 | ORAL_TABLET | ORAL | Status: DC | PRN
  Administered 2011-11-28 – 2011-11-30 (×10): 1 via ORAL
  Filled 2011-11-28 (×10): qty 1

## 2011-11-28 MED ORDER — SUMATRIPTAN SUCCINATE 6 MG/0.5ML ~~LOC~~ SOLN: 6.0000 mg | SUBCUTANEOUS | Status: DC | PRN

## 2011-11-28 MED ORDER — SUMATRIPTAN SUCCINATE 6 MG/0.5ML ~~LOC~~ SOLN
6.0000 mg | SUBCUTANEOUS | Status: DC | PRN
  Administered 2011-11-28 – 2011-12-09 (×4): 6 mg via SUBCUTANEOUS
  Filled 2011-11-28 (×6): qty 0.5

## 2011-11-28 MED ORDER — ONDANSETRON HCL 4 MG/2ML IJ SOLN
4.0000 mg | Freq: Four times a day (QID) | INTRAMUSCULAR | Status: DC | PRN
  Administered 2011-11-30 – 2011-12-10 (×9): 4 mg via INTRAVENOUS
  Filled 2011-11-28 (×10): qty 2

## 2011-11-28 MED ORDER — NAPROXEN 500 MG PO TABS
500.0000 mg | ORAL_TABLET | Freq: Two times a day (BID) | ORAL | Status: DC
  Filled 2011-11-28 (×15): qty 1

## 2011-11-28 MED ORDER — ERTAPENEM SODIUM 1 G IJ SOLR
1.0000 g | INTRAMUSCULAR | Status: AC
  Administered 2011-11-28 – 2011-12-04 (×6): 1 g via INTRAVENOUS
  Filled 2011-11-28 (×10): qty 1

## 2011-11-28 MED ORDER — PROMETHAZINE HCL 25 MG/ML IJ SOLN
6.2500 mg | Freq: Four times a day (QID) | INTRAMUSCULAR | Status: DC | PRN
  Administered 2011-11-28 – 2011-12-01 (×6): 6.25 mg via INTRAVENOUS
  Filled 2011-11-28 (×6): qty 1

## 2011-11-28 MED ORDER — FENTANYL CITRATE 0.05 MG/ML IJ SOLN
INTRAMUSCULAR | Status: AC
  Filled 2011-11-28: qty 2

## 2011-11-28 NOTE — Anesthesia Postprocedure Evaluation (Signed)
  Anesthesia Post-op Note  Patient: Hayley [name]  Procedure(s) Performed: Procedure(s) (LRB): APPENDECTOMY LAPAROSCOPIC (N/A)  Patient Location: PACU  Anesthesia Type: General  Level of Consciousness: awake, alert , oriented, patient cooperative and responds to stimulation  Airway and Oxygen Therapy: Patient Spontanous Breathing and Patient connected to face mask oxygen  Post-op Pain: mild  Post-op Assessment: Post-op Vital signs reviewed, Patient's Cardiovascular Status Stable, Respiratory Function Stable, Patent Airway, No signs of Nausea or vomiting, Adequate PO intake, Pain level controlled and No headache  Post-op Vital Signs: Reviewed and stable  Complications: No apparent anesthesia complications

## 2011-11-28 NOTE — Progress Notes (Signed)
INITIAL ADULT NUTRITION ASSESSMENT Date: 11/28/2011   Time: 10:31 AM Reason for Assessment: consult  ASSESSMENT: Female 66 y.o.  Dx: Appendicitis, acute, with peritonitis  Hx:  Past Medical History  Diagnosis Date  . Hyperthyroidism   . Toxic goiter   . Reflux   . Colon polyps   . Osteoarthritis     in back  . Migraines   . Disorder of vocal cords     spasmotic dysphonia  . Obesity, Class III, BMI 40-49.9 (morbid obesity) 04/27/2010   Past Surgical History  Procedure Date  . Abdominal hysterectomy   . Zenkers diverticulum   . Kidney stone surgery     Related Meds:  Scheduled Meds:   . ertapenem  1 g Intravenous Q24H  . fentaNYL      . gabapentin  800 mg Oral BID  . HYDROmorphone      .  HYDROmorphone (DILAUDID) injection  1 mg Intravenous Once  .  HYDROmorphone (DILAUDID) injection  1 mg Intravenous Once  . insulin aspart  0-20 Units Subcutaneous Q4H  . naproxen  500 mg Oral BID WC  . ondansetron (ZOFRAN) IV  4 mg Intravenous Once  . sodium chloride 0.9 % 50 mL with promethazine (PHENERGAN) 25 mg infusion   Intravenous Once  . sodium chloride  1,000 mL Intravenous Once  . DISCONTD: chlorhexidine  1 application Topical Once  . DISCONTD: chlorhexidine  1 application Topical Once  . DISCONTD: cyclobenzaprine  10 mg Oral QHS  . DISCONTD: diclofenac  1 patch Transdermal BID  . DISCONTD: DULoxetine  60 mg Oral Daily  . DISCONTD: EPINEPHrine  0.3 mg Intramuscular Once  . DISCONTD: ertapenem  1 g Intravenous Q24H  . DISCONTD: Flora-Q  1 capsule Oral Daily  . DISCONTD: gabapentin  800 mg Oral BID  . DISCONTD: heparin subcutaneous  5,000 Units Subcutaneous Q8H  . DISCONTD: lactated ringers  1,000 mL Intravenous Once  . DISCONTD: levothyroxine  25 mcg Oral QAC breakfast  . DISCONTD: lip balm  1 application Topical BID  . DISCONTD: metoprolol succinate  50 mg Oral QODAY  . DISCONTD: mometasone-formoterol  2 puff Inhalation BID  . DISCONTD: promethazine  25 mg Intravenous  Once  . DISCONTD: topiramate  100 mg Oral QHS   Continuous Infusions:   . dextrose 5 % and 0.9 % NaCl with KCl 20 mEq/L 100 mL/hr at 11/28/11 0810  . DISCONTD: lactated ringers 100 mL/hr at 11/28/11 0100  . DISCONTD: lactated ringers     PRN Meds:.EPINEPHrine, iohexol, oxyCODONE-acetaminophen, DISCONTD: alum & mag hydroxide-simeth, DISCONTD: bisacodyl, DISCONTD: bupivacaine-EPINEPHrine, DISCONTD: clonazePAM, DISCONTD: diphenhydrAMINE, DISCONTD: fentaNYL, DISCONTD: guaiFENesin-dextromethorphan, DISCONTD: HYDROmorphone, DISCONTD:  HYDROmorphone (DILAUDID) injection, DISCONTD: lactated ringers, DISCONTD: magic mouthwash, DISCONTD: metoprolol, DISCONTD: ondansetron (ZOFRAN) IV DISCONTD: ondansetron, DISCONTD: promethazine, DISCONTD: promethazine, DISCONTD: SUMAtriptan   Ht: 5\' 6"  (167.6 cm)  Wt: 233 lb 14.5 oz (106.1 kg)  Ideal Wt: 59 kg % Ideal Wt: 179%  Usual Wt: same % Usual Wt: 100%  Body mass index is 37.75 kg/(m^2).  Food/Nutrition Related Hx: spasmotic dysphonia with botox injections, variable wt 220-234 lbs for the past 2 years, pt with decreased PO intake for 1 week PTA.  Labs:  CMP     Component Value Date/Time   NA 137 11/27/2011 1331   K 3.8 11/27/2011 1331   CL 106 11/27/2011 1331   CO2 25 11/27/2011 1331   GLUCOSE 127* 11/27/2011 1331   BUN 5* 11/27/2011 1331   CREATININE 0.62 11/27/2011 1331   CALCIUM 8.8  11/27/2011 1331   PROT 7.1 11/27/2011 1331   ALBUMIN 3.5 11/27/2011 1331   AST 14 11/27/2011 1331   ALT 8 11/27/2011 1331   ALKPHOS 62 11/27/2011 1331   BILITOT 0.5 11/27/2011 1331   GFRNONAA >90 11/27/2011 1331   GFRAA >90 11/27/2011 1331    Intake: NPO  Intake/Output Summary (Last 24 hours) at 11/28/11 1034 Last data filed at 11/28/11 0800  Gross per 24 hour  Intake   2700 ml  Output   1275 ml  Net   1425 ml   No BMs since admission   Diet Order:  NPO  Supplements/Tube Feeding: none at this time  IVF:    dextrose 5 % and 0.9 % NaCl with KCl 20 mEq/L  Last Rate: 100 mL/hr at 11/28/11 4540  DISCONTD: lactated ringers Last Rate: 100 mL/hr at 11/28/11 0100  DISCONTD: lactated ringers     Estimated Nutritional Needs:   Kcal: 1650-1880 kcal Protein: 70-83g Fluid: >1.8 L/day  Pt suspected wt loss on admission, however after discussion with pt and husband, appears she is at her usual wt.  Pt reports decreased intake for the past week related to increased pain and decreased appetite.  Pt is s/p ex lap yesterday for appendectomy.   Pt with several chronic issues that affect her intake.  She has spasmotic dysphonia for which she gets botox injections.  Pt reports last injections was Nov 2012.  After injections she has difficulty swallowing water, however this typically resolves.  Pt reports she sometimes chokes on food and liquids, but is able to cough it up and overall manages well.  Pt has addressed this with MD who encouraged pt that management was good. Pt states her goals are to better manage pain and headaches, and to return to a more functional/mobile status.  Seems discouraged.   NUTRITION DIAGNOSIS: -Inadequate oral intake (NI-2.1).  Status: Ongoing  RELATED TO: appendicitis, weakness, pain  AS EVIDENCE BY: pt with decreased PO intake for 1 week PTA, NPO s/p ex lap for appendectomy  MONITORING/EVALUATION(Goals): 1.  Food/Beverage; diet advanced as tolerated per MD.  Pt to improve intake  EDUCATION NEEDS: -Education needs addressed with pt and husband  INTERVENTION: 1.  General healthful diet; diet advancement per MD as tolerated to Regular.  Discussed swallowing function with pt and services available if pt feels this is not at baseline or is worsening. 2.  Other providers; if appropriate, consider evaluation with SLP.  Dietitian #: [phone]  DOCUMENTATION CODES Per approved criteria  -Obesity Unspecified    Loyce Dys [facility] 11/28/2011, 10:31 AM

## 2011-11-28 NOTE — Progress Notes (Signed)
sbar reviewed by Marlene Lard step down/icu, denied questions

## 2011-11-28 NOTE — Progress Notes (Signed)
1 Day Post-Op  Subjective: Abdomen is sore.  She has been hypoxic requiring 100% O2 by mask.  Objective: Vital signs in last 24 hours: Temp:  [97 F (36.1 C)-100.3 F (37.9 C)] 97.6 F (36.4 C) (03/13 0400) Pulse Rate:  [85-104] 102  (03/13 0600) Resp:  [14-23] 18  (03/13 0600) BP: (91-132)/(64-85) 99/85 mmHg (03/13 0600) SpO2:  [86 %-94 %] 92 % (03/13 0600) FiO2 (%):  [50 %-100 %] 100 % (03/13 0145) Weight:  [233 lb 14.5 oz (106.1 kg)] 233 lb 14.5 oz (106.1 kg) (03/13 0130) Last BM Date: 11/26/11  Intake/Output from previous day: 03/12 0701 - 03/13 0700 In: 2600 [I.V.:2600] Out: 1125 [Urine:1125] Intake/Output this shift:    PE: CV-increased rate LUNG-decreased breath sounds in the bases ABD-soft, tender around incision sites, dressings dry  Lab Results:   Basename 11/27/11 1331  WBC 12.9*  HGB 13.3  HCT 42.0  PLT 238   BMET  Basename 11/27/11 1331  NA 137  K 3.8  CL 106  CO2 25  GLUCOSE 127*  BUN 5*  CREATININE 0.62  CALCIUM 8.8   PT/INR No results found for this basename: LABPROT:2,INR:2 in the last 72 hours Comprehensive Metabolic Panel:    Component Value Date/Time   NA 137 11/27/2011 1331   K 3.8 11/27/2011 1331   CL 106 11/27/2011 1331   CO2 25 11/27/2011 1331   BUN 5* 11/27/2011 1331   CREATININE 0.62 11/27/2011 1331   GLUCOSE 127* 11/27/2011 1331   CALCIUM 8.8 11/27/2011 1331   AST 14 11/27/2011 1331   ALT 8 11/27/2011 1331   ALKPHOS 62 11/27/2011 1331   BILITOT 0.5 11/27/2011 1331   PROT 7.1 11/27/2011 1331   ALBUMIN 3.5 11/27/2011 1331     Studies/Results: Dg Chest 2 View  11/27/2011  *RADIOLOGY REPORT*  Clinical Data: Nausea, vomiting, cough  CHEST - 2 VIEW  Comparison: 12/18/2006  Findings: Borderline cardiomegaly but normal vascularity.  Negative for edema, CHF, collapse, consolidation, pneumonia, effusion or pneumothorax.  Trachea midline.  Degenerative changes of the spine. Lateral view is limited with motion artifact.  Cholecystectomy  noted.  IMPRESSION: Borderline cardiomegaly without CHF or pneumonia.  Stable exam  Original Report Authenticated By: Judie Petit. Ruel Favors, M.D.   Ct Abdomen Pelvis W Contrast  11/27/2011  *RADIOLOGY REPORT*  Clinical Data: Right lower quadrant pain with nausea and vomiting.  CT ABDOMEN AND PELVIS WITH CONTRAST  Technique:  Multidetector CT imaging of the abdomen and pelvis was performed following the standard protocol during bolus administration of intravenous contrast.  Contrast: OMNIPAQUE IOHEXOL 300 MG/ML IJ SOLN  Comparison: 08/15/2010  Findings: There is evidence of acute appendicitis.  Focal calcified appendicolith is present near the proximal origin of the appendix. The distal appendix is severely edematous measuring approximately 18 mm in greatest diameter.  The appendix ascends posterior to the cecum.  The colon is very redundant in the right abdomen.  Severe inflammatory changes are seen around the appendix with adjacent fluid present.  No focal abscess is currently present.  Based on CT appearance, the appendix may be perforated.  Other bowel loops are unremarkable.  There is significant atelectasis at both lung bases.  The gallbladder is been removed. The liver, pancreas, spleen, adrenal glands and kidneys are unremarkable.  No hernias are identified.  The bladder is within normal limits.  Bony structures show degenerative changes of the spine.  IMPRESSION: Acute appendicitis with calcified proximal appendicolith and severely inflamed appendix present which may be a  ruptured based on CT appearance.  No focal abscess is identified.  Original Report Authenticated By: Reola Calkins, M.D.    Anti-infectives: Anti-infectives     Start     Dose/Rate Route Frequency Ordered Stop   11/28/11 2130   ertapenem (INVANZ) 1 g in sodium chloride 0.9 % 50 mL IVPB        1 g 100 mL/hr over 30 Minutes Intravenous Every 24 hours 11/28/11 0201 12/05/11 2129   11/27/11 2130   ertapenem (INVANZ) 1 g in  sodium chloride 0.9 % 50 mL IVPB  Status:  Discontinued        1 g 100 mL/hr over 30 Minutes Intravenous Every 24 hours 11/27/11 2104 11/28/11 0201          Assessment Principal Problem:  *Appendicitis, acute, with peritonitis-s/p laparoscopic appendectomy  Active Problems:  DIABETES MELLITUS, TYPE II-adequate control  Hypoxia   LOS: 1 day   Plan: Pulmonary/CCM consult.  Leave foley in.  Leave in SDU.  IV abxs.   Hayley [name] J 11/28/2011

## 2011-11-28 NOTE — Progress Notes (Signed)
eLink Physician-Brief Progress Note Patient Name: Hayley [name] DOB: 26-Mar-1946 MRN: [id number]  Date of Service  11/28/2011   HPI/Events of Note     eICU Interventions  Resume dulera    Intervention Category Intermediate Interventions: Bronchospasm - evaluation and treatment  Colin Ellers V. 11/28/2011, 5:07 PM

## 2011-11-28 NOTE — Progress Notes (Signed)
Pt didn't tolerate our cpap machine last night. Pt has her home cpap now & placed on at this time. I bled 6L O2 in at this time, SpO2-93%. Will continue to monitor.  Jacqulynn Cadet RRT

## 2011-11-28 NOTE — Consult Note (Signed)
Name: Hayley [name] MRN: [id number] DOB: 1945/10/08    LOS: 1 Requesting MD: Tomasa Hosteller Reason: Resp Distress  PCCM CONSULT NOTE  Brief patient profile:  66 yo wf with PMH significant for spasmodic dystonia who underwent lap appy 3/12. She was tx to SDU 3/13 for hypoxia. Note she has OSA on Cpap at home and has been receiving IV narcotics. She did not tolerate any configuration of Cpap and was then placed on 100% NRB.   PCCM asked to consult 3/13 0800. IV dilaudid was dc'd and request for home cpap was placed.    Lines / Drains:   Cultures: none  Antibiotics: 3/12 invanz (GI coverage p lap)>>  Tests / Events: 3/12 lap appendectomy 3/13 tx to sdu  Subjective: NAD. Awake and alert. Unusal  Affect. Patient failed to answer a single question asked in a straightforward manner, tending to go off on tangents or answer questions with ambiguous medical terms or diagnoses and seemed perplexed and at times bewildered   when asked the same question more than once for clarification.   Past Medical History  Diagnosis Date  . Hyperthyroidism   . Toxic goiter   . Reflux   . Colon polyps   . Osteoarthritis     in back  . Migraines   . Disorder of vocal cords     spasmotic dysphonia  . Obesity, Class III, BMI 40-49.9 (morbid obesity) 04/27/2010   Past Surgical History  Procedure Date  . Abdominal hysterectomy   . Zenkers diverticulum   . Kidney stone surgery    Prior to Admission medications   Medication Sig Start Date End Date Taking? Authorizing Provider  aspirin-sod bicarb-citric acid (ALKA-SELTZER) 325 MG TBEF Take 325 mg by mouth every 6 (six) hours as needed.   Yes Historical Provider, MD  clonazePAM (KLONOPIN) 0.5 MG tablet 0.5 mg daily as needed. For agitation or anxiety   Yes Historical Provider, MD  cyclobenzaprine (FLEXERIL) 10 MG tablet Take 10 mg by mouth at bedtime.   Yes Historical Provider, MD  diclofenac (FLECTOR) 1.3 % PTCH Place 1 patch onto the skin 2 (two) times  daily. Apply to back   Yes Historical Provider, MD  DULoxetine (CYMBALTA) 60 MG capsule Take 60 mg by mouth daily.   Yes Historical Provider, MD  EPINEPHrine (EPIPEN) 0.3 mg/0.3 mL DEVI Inject 0.3 mg into the muscle once.   Yes Historical Provider, MD  gabapentin (NEURONTIN) 800 MG tablet Take 800 mg by mouth 2 (two) times daily.   Yes Historical Provider, MD  levothyroxine (SYNTHROID, LEVOTHROID) 25 MCG tablet Take 25 mcg by mouth daily.   Yes Historical Provider, MD  metoprolol succinate (TOPROL-XL) 50 MG 24 hr tablet Take 50 mg by mouth every other day.   Yes Historical Provider, MD  mometasone-formoterol (DULERA) 100-5 MCG/ACT AERO Inhale 2 puffs into the lungs 2 (two) times daily.   Yes Historical Provider, MD  oxyCODONE-acetaminophen (PERCOCET) 10-325 MG per tablet Take 1 tablet by mouth 2 (two) times daily.   Yes Historical Provider, MD  promethazine (PHENERGAN) 25 MG tablet Take 25 mg by mouth every 8 (eight) hours as needed. For nausea   Yes Historical Provider, MD  SUMAtriptan (IMITREX) 6 MG/0.5ML SOLN injection Inject 6 mg into the skin every 2 (two) hours as needed.   Yes Historical Provider, MD   Allergies Allergies  Allergen Reactions  . Bee Anaphylaxis  . Etodolac Nausea And Vomiting  . Morphine Other (See Comments)    Hallucinations  . Propoxyphene  N-Acetaminophen Nausea And Vomiting  . Penicillins Rash    Family History History reviewed. No pertinent family history.  Social History  reports that she has never smoked. She has never used smokeless tobacco. She reports that she does not drink alcohol or use illicit drugs.  Review Of Systems  11 points review of systems is negative with an exception of listed in HPI.  Vital Signs: BP 96/65  Pulse 100  Temp(Src) 97 F (36.1 C) (Axillary)  Resp 18  Ht 5\' 6"  (1.676 m)  Wt 233 lb 14.5 oz (106.1 kg)  BMI 37.75 kg/m2  SpO2 95% on "70%" nrb   Intake/Output Summary (Last 24 hours) at 11/28/11 1130 Last data filed at  11/28/11 1100  Gross per 24 hour  Intake   3000 ml  Output   1350 ml  Net   1650 ml       . dextrose 5 % and 0.9 % NaCl with KCl 20 mEq/L 100 mL/hr at 11/28/11 0810  . DISCONTD: lactated ringers 100 mL/hr at 11/28/11 0100  . DISCONTD: lactated ringers       Physical Examination: General:  MO WF NAD at rest.   Neuro:  intact   HEENT:  No jvd. Voice weak Cardiovascular:  hsr rrr Lungs:  cta Abdomen:  Tender +bs Musculoskeletal:  intact Skin:  Warm , no edema  Ventilator settings: Vent Mode:  [-]  FiO2 (%):  [50 %-100 %] 100 %  Labs and Imaging:  Dg Chest 2 View  11/27/2011  *RADIOLOGY REPORT*  Clinical Data: Nausea, vomiting, cough  CHEST - 2 VIEW  Comparison: 12/18/2006  Findings: Borderline cardiomegaly but normal vascularity.  Negative for edema, CHF, collapse, consolidation, pneumonia, effusion or pneumothorax.  Trachea midline.  Degenerative changes of the spine. Lateral view is limited with motion artifact.  Cholecystectomy noted.  IMPRESSION: Borderline cardiomegaly without CHF or pneumonia.  Stable exam  Original Report Authenticated By: Judie Petit. Ruel Favors, M.D.   Ct Abdomen Pelvis W Contrast  11/27/2011  *RADIOLOGY REPORT*  Clinical Data: Right lower quadrant pain with nausea and vomiting.  CT ABDOMEN AND PELVIS WITH CONTRAST  Technique:  Multidetector CT imaging of the abdomen and pelvis was performed following the standard protocol during bolus administration of intravenous contrast.  Contrast: OMNIPAQUE IOHEXOL 300 MG/ML IJ SOLN  Comparison: 08/15/2010  Findings: There is evidence of acute appendicitis.  Focal calcified appendicolith is present near the proximal origin of the appendix. The distal appendix is severely edematous measuring approximately 18 mm in greatest diameter.  The appendix ascends posterior to the cecum.  The colon is very redundant in the right abdomen.  Severe inflammatory changes are seen around the appendix with adjacent fluid present.  No focal  abscess is currently present.  Based on CT appearance, the appendix may be perforated.  Other bowel loops are unremarkable.  There is significant atelectasis at both lung bases.  The gallbladder is been removed. The liver, pancreas, spleen, adrenal glands and kidneys are unremarkable.  No hernias are identified.  The bladder is within normal limits.  Bony structures show degenerative changes of the spine.  IMPRESSION: Acute appendicitis with calcified proximal appendicolith and severely inflamed appendix present which may be a ruptured based on CT appearance.  No focal abscess is identified.  Original Report Authenticated By: Reola Calkins, M.D.    Lab 11/27/11 1331  NA 137  K 3.8  CL 106  CO2 25  BUN 5*  CREATININE 0.62  GLUCOSE 127*  Lab 11/27/11 1331  HGB 13.3  HCT 42.0  WBC 12.9*  PLT 238   ABG No results found for this basename: phart, pco2, pco2art, po2, po2art, hco3, tco2, acidbasedef, o2sat    Assessment and Plan: Hypoxia , multifactorial, OSA, VCD, Narcotics with abd surgery. -O2 as needed -Home cpap -ppi bid -dc iv narcotics use po -intubate if needed  VCD seen at [facility] and [facility]NT [facility] -see above  Hypothroid -synthroid  Abd surgery -per ccs   Depression -continue home meds   Best practices / Disposition: -->SDU status under CCS -->full code --> PAS DVT Px -->Protonix for GI Px -->diet -->husband updated at bedside  [facility] Minor ACNP Adolph Pollack PCCM Pager [phone] till 3 pm If no answer page [phone] 11/28/2011, 8:32 AM  Pt independently  seen and examined and available cxr's reviewed and I agree with above findings/ imp/ plan   The patient is critically ill with multiple organ systems failure and requires high complexity decision making for assessment and support, frequent evaluation and titration of therapies, application of advanced monitoring technologies and extensive interpretation of multiple databases. Critical Care Time devoted to  patient care services described in this note is 45 minutes.   Sandrea Hughs, MD Pulmonary and Critical Care Medicine [facility] Cell [phone]

## 2011-11-29 DIAGNOSIS — J9819 Other pulmonary collapse: Secondary | ICD-10-CM

## 2011-11-29 DIAGNOSIS — J9600 Acute respiratory failure, unspecified whether with hypoxia or hypercapnia: Secondary | ICD-10-CM

## 2011-11-29 DIAGNOSIS — G4739 Other sleep apnea: Secondary | ICD-10-CM

## 2011-11-29 LAB — BASIC METABOLIC PANEL
BUN: 7 mg/dL (ref 6–23)
CO2: 26 mEq/L (ref 19–32)
Calcium: 8.5 mg/dL (ref 8.4–10.5)
Chloride: 108 mEq/L (ref 96–112)
Creatinine, Ser: 0.71 mg/dL (ref 0.50–1.10)
GFR calc Af Amer: >90 mL/min (ref >90)
GFR calc non Af Amer: 88 mL/min — ABNORMAL LOW (ref >90)
Glucose, Bld: 139 mg/dL — ABNORMAL HIGH (ref 70–99)
Potassium: 3.5 mEq/L (ref 3.5–5.1)
Sodium: 140 mEq/L (ref 135–145)

## 2011-11-29 LAB — GLUCOSE, CAPILLARY
Glucose-Capillary: 110 mg/dL — ABNORMAL HIGH (ref 70–99)
Glucose-Capillary: 133 mg/dL — ABNORMAL HIGH (ref 70–99)
Glucose-Capillary: 133 mg/dL — ABNORMAL HIGH (ref 70–99)
Glucose-Capillary: 141 mg/dL — ABNORMAL HIGH (ref 70–99)
Glucose-Capillary: 154 mg/dL — ABNORMAL HIGH (ref 70–99)

## 2011-11-29 LAB — CBC
HCT: 37.3 % (ref 36.0–46.0)
Hemoglobin: 11.9 g/dL — ABNORMAL LOW (ref 12.0–15.0)
MCH: 28.2 pg (ref 26.0–34.0)
MCHC: 31.9 g/dL (ref 30.0–36.0)
MCV: 88.4 fL (ref 78.0–100.0)
Platelets: 218 10*3/uL (ref 150–400)
RBC: 4.22 MIL/uL (ref 3.87–5.11)
RDW: 14.2 % (ref 11.5–15.5)
WBC: 8.7 10*3/uL (ref 4.0–10.5)

## 2011-11-29 MED ORDER — VITAMINS A & D EX OINT
TOPICAL_OINTMENT | CUTANEOUS | Status: AC
  Filled 2011-11-29: qty 5

## 2011-11-29 MED ORDER — METOPROLOL TARTRATE 1 MG/ML IV SOLN
2.5000 mg | Freq: Once | INTRAVENOUS | Status: AC
  Administered 2011-11-29: 2.5 mg via INTRAVENOUS
  Filled 2011-11-29: qty 5

## 2011-11-29 MED ORDER — INSULIN ASPART 100 UNIT/ML ~~LOC~~ SOLN
0.0000 [IU] | Freq: Four times a day (QID) | SUBCUTANEOUS | Status: DC
  Administered 2011-11-30: 1 [IU] via SUBCUTANEOUS
  Administered 2011-11-30: 4 [IU] via SUBCUTANEOUS
  Administered 2011-11-30 – 2011-12-01 (×4): 3 [IU] via SUBCUTANEOUS
  Administered 2011-12-02: 1 [IU] via SUBCUTANEOUS
  Administered 2011-12-07 – 2011-12-09 (×3): 3 [IU] via SUBCUTANEOUS
  Administered 2011-12-09: 4 [IU] via SUBCUTANEOUS
  Administered 2011-12-10 (×2): 3 [IU] via SUBCUTANEOUS
  Administered 2011-12-10: 4 [IU] via SUBCUTANEOUS
  Administered 2011-12-11: 3 [IU] via SUBCUTANEOUS
  Administered 2011-12-11: 4 [IU] via SUBCUTANEOUS

## 2011-11-29 MED ORDER — IPRATROPIUM BROMIDE 0.02 % IN SOLN
0.5000 mg | Freq: Four times a day (QID) | RESPIRATORY_TRACT | Status: DC | PRN
  Administered 2011-12-02: 0.5 mg via RESPIRATORY_TRACT
  Filled 2011-11-29: qty 2.5

## 2011-11-29 NOTE — Progress Notes (Signed)
eLink Physician-Brief Progress Note Patient Name: Hayley [name] DOB: [birth year] MRN: [id number]  Date of Service  11/29/2011   HPI/Events of Note   Persistent tachy - appears sinus Comfortable supine, satn 92 % on 5 L , refusing CPAP Note VCD, not asthma, on metoprolol at home  eICU Interventions  Lopressor IV x 1 CPAP during sleep Atrovent nebs prn   Intervention Category Intermediate Interventions: Arrhythmia - evaluation and management  Orine Goga V. 11/29/2011, 4:34 PM

## 2011-11-29 NOTE — Progress Notes (Signed)
Dr  Vassie Loll called about  tachycardia, HR 120's since about 2pm, pt in no acute distress. Pt was up to chair during this time.Some exp wheezing heard. Sats 91-92 on 5 L, does not want 02 mask for now. BP low 100's. Will monitor for now.

## 2011-11-29 NOTE — Progress Notes (Signed)
Name: Hayley [name] MRN: [id number] DOB: Jan 28, 1946    LOS: 2 Requesting MD: Tomasa Hosteller Reason: Resp Distress  PCCM PROGRESS NOTE  Brief patient profile:  66 yo wf with PMH significant for spasmodic dystonia who underwent lap appy 3/12. She was tx to SDU 3/13 for hypoxia. Note she has OSA on Cpap at home and has been receiving IV narcotics. She did not tolerate any configuration of Cpap and was then placed on 100% NRB.   PCCM asked to consult 3/13 0800. IV dilaudid was dc'd and request for home cpap was placed.       Cultures: MRSA screen 3/13 > neg  Antibiotics: 3/12 invanz (GI coverage p lap)>>  Tests / Events: 3/12 lap appendectomy 3/13 tx to sdu  Subjective: NAD. Awake and alert. On home cpap. Vital Signs: BP 101/59  Pulse 113  Temp(Src) 98.3 F (36.8 C) (Oral)  Resp 23  Ht 5\' 6"  (1.676 m)  Wt 233 lb 14.5 oz (106.1 kg)  BMI 37.75 kg/m2  SpO2 91% on 6 l/m bleed in via cpap   Intake/Output Summary (Last 24 hours) at 11/29/11 0926 Last data filed at 11/29/11 0600  Gross per 24 hour  Intake   2100 ml  Output   1425 ml  Net    675 ml       . dextrose 5 % and 0.9 % NaCl with KCl 20 mEq/L 1,000 mL (11/29/11 0550)     Physical Examination: General:  MO WF NAD at rest.   Neuro:  intact   HEENT:  No jvd.  Cardiovascular:  hsr rrr Lungs:  cta Abdomen:  Tender +bs Musculoskeletal:  intact Skin:  Warm , no edema   Labs    Lab 11/29/11 0335 11/27/11 1331  NA 140 137  K 3.5 3.8  CL 108 106  CO2 26 25  BUN 7 5*  CREATININE 0.71 0.62  GLUCOSE 139* 127*    Lab 11/29/11 0335 11/27/11 1331  HGB 11.9* 13.3  HCT 37.3 42.0  WBC 8.7 12.9*  PLT 218 238           Imaging:  Dg Chest 2 View  11/27/2011  *RADIOLOGY REPORT*  Clinical Data: Nausea, vomiting, cough  CHEST - 2 VIEW  Comparison: 12/18/2006  Findings: Borderline cardiomegaly but normal vascularity.  Negative for edema, CHF, collapse, consolidation, pneumonia, effusion or pneumothorax.  Trachea  midline.  Degenerative changes of the spine. Lateral view is limited with motion artifact.  Cholecystectomy noted.  IMPRESSION: Borderline cardiomegaly without CHF or pneumonia.  Stable exam  Original Report Authenticated By: Judie Petit. Ruel Favors, M.D.   Ct Abdomen Pelvis W Contrast  11/27/2011  *RADIOLOGY REPORT*  Clinical Data: Right lower quadrant pain with nausea and vomiting.  CT ABDOMEN AND PELVIS WITH CONTRAST  Technique:  Multidetector CT imaging of the abdomen and pelvis was performed following the standard protocol during bolus administration of intravenous contrast.  Contrast: OMNIPAQUE IOHEXOL 300 MG/ML IJ SOLN  Comparison: 08/15/2010  Findings: There is evidence of acute appendicitis.  Focal calcified appendicolith is present near the proximal origin of the appendix. The distal appendix is severely edematous measuring approximately 18 mm in greatest diameter.  The appendix ascends posterior to the cecum.  The colon is very redundant in the right abdomen.  Severe inflammatory changes are seen around the appendix with adjacent fluid present.  No focal abscess is currently present.  Based on CT appearance, the appendix may be perforated.  Other bowel loops are unremarkable.  There is significant atelectasis at both lung bases.  The gallbladder is been removed. The liver, pancreas, spleen, adrenal glands and kidneys are unremarkable.  No hernias are identified.  The bladder is within normal limits.  Bony structures show degenerative changes of the spine.  IMPRESSION: Acute appendicitis with calcified proximal appendicolith and severely inflamed appendix present which may be a ruptured based on CT appearance.  No focal abscess is identified.  Original Report Authenticated By: Reola Calkins, M.D.    Lab 11/29/11 0335 11/27/11 1331  NA 140 137  K 3.5 3.8  CL 108 106  CO2 26 25  BUN 7 5*  CREATININE 0.71 0.62  GLUCOSE 139* 127*    Lab 11/29/11 0335 11/27/11 1331  HGB 11.9* 13.3  HCT 37.3  42.0  WBC 8.7 12.9*  PLT 218 238   ABG No results found for this basename: phart,  pco2,  pco2art,  po2,  po2art,  hco3,  tco2,  acidbasedef,  o2sat    Assessment and Plan: Hypoxia , multifactorial, OSA, VCD, Narcotics with abd surgery. -O2 as needed -Home cpap -ppi bid -dc iv narcotics > use po    VCD seen at [facility] and [facility] -see above  Hypothroid -synthroid  Abd surgery -per ccs   Depression -continue home meds  3/14 no further PCCM issues will be available prn.  Best practices / Disposition: -->SDU status under CCS -->full code --> PAS DVT Px -->Protonix for GI Px -->diet -->husband updated at bedside  [facility] Minor ACNP Adolph Pollack PCCM Pager [phone] till 3 pm If no answer page [phone] 11/29/2011, 9:26 AM  Pt independently  seen and examined and available cxr's reviewed and I agree with above findings/ imp/ plan   Sandrea Hughs, MD Pulmonary and Critical Care Medicine [facility] Cell [phone]

## 2011-11-29 NOTE — Progress Notes (Signed)
2 Days Post-Op  Subjective: Abdomen sore and distended  Objective: Vital signs in last 24 hours: Temp:  [97 F (36.1 C)-100.2 F (37.9 C)] 100.2 F (37.9 C) (03/14 0400) Pulse Rate:  [99-113] 113  (03/14 0400) Resp:  [16-23] 23  (03/14 0400) BP: (92-111)/(58-70) 101/59 mmHg (03/14 0400) SpO2:  [87 %-95 %] 91 % (03/14 0400) FiO2 (%):  [50 %-100 %] 50 % (03/13 1100) Last BM Date: 11/26/11  Intake/Output from previous day: 03/13 0701 - 03/14 0700 In: 2200 [I.V.:2150; IV Piggyback:50] Out: 1575 [Urine:1575] Intake/Output this shift:    PE: Abd-slightly firm and distended, dressings dry, hypoactive BS  Lab Results:   Basename 11/29/11 0335 11/27/11 1331  WBC 8.7 12.9*  HGB 11.9* 13.3  HCT 37.3 42.0  PLT 218 238   BMET  Basename 11/29/11 0335 11/27/11 1331  NA 140 137  K 3.5 3.8  CL 108 106  CO2 26 25  GLUCOSE 139* 127*  BUN 7 5*  CREATININE 0.71 0.62  CALCIUM 8.5 8.8   PT/INR No results found for this basename: LABPROT:2,INR:2 in the last 72 hours Comprehensive Metabolic Panel:    Component Value Date/Time   NA 140 11/29/2011 0335   K 3.5 11/29/2011 0335   CL 108 11/29/2011 0335   CO2 26 11/29/2011 0335   BUN 7 11/29/2011 0335   CREATININE 0.71 11/29/2011 0335   GLUCOSE 139* 11/29/2011 0335   CALCIUM 8.5 11/29/2011 0335   AST 14 11/27/2011 1331   ALT 8 11/27/2011 1331   ALKPHOS 62 11/27/2011 1331   BILITOT 0.5 11/27/2011 1331   PROT 7.1 11/27/2011 1331   ALBUMIN 3.5 11/27/2011 1331     Studies/Results: Dg Chest 2 View  11/27/2011  *RADIOLOGY REPORT*  Clinical Data: Nausea, vomiting, cough  CHEST - 2 VIEW  Comparison: 12/18/2006  Findings: Borderline cardiomegaly but normal vascularity.  Negative for edema, CHF, collapse, consolidation, pneumonia, effusion or pneumothorax.  Trachea midline.  Degenerative changes of the spine. Lateral view is limited with motion artifact.  Cholecystectomy noted.  IMPRESSION: Borderline cardiomegaly without CHF or pneumonia.  Stable  exam  Original Report Authenticated By: Judie Petit. Ruel Favors, M.D.   Ct Abdomen Pelvis W Contrast  11/27/2011  *RADIOLOGY REPORT*  Clinical Data: Right lower quadrant pain with nausea and vomiting.  CT ABDOMEN AND PELVIS WITH CONTRAST  Technique:  Multidetector CT imaging of the abdomen and pelvis was performed following the standard protocol during bolus administration of intravenous contrast.  Contrast: OMNIPAQUE IOHEXOL 300 MG/ML IJ SOLN  Comparison: 08/15/2010  Findings: There is evidence of acute appendicitis.  Focal calcified appendicolith is present near the proximal origin of the appendix. The distal appendix is severely edematous measuring approximately 18 mm in greatest diameter.  The appendix ascends posterior to the cecum.  The colon is very redundant in the right abdomen.  Severe inflammatory changes are seen around the appendix with adjacent fluid present.  No focal abscess is currently present.  Based on CT appearance, the appendix may be perforated.  Other bowel loops are unremarkable.  There is significant atelectasis at both lung bases.  The gallbladder is been removed. The liver, pancreas, spleen, adrenal glands and kidneys are unremarkable.  No hernias are identified.  The bladder is within normal limits.  Bony structures show degenerative changes of the spine.  IMPRESSION: Acute appendicitis with calcified proximal appendicolith and severely inflamed appendix present which may be a ruptured based on CT appearance.  No focal abscess is identified.  Original Report  Authenticated By: Reola Calkins, M.D.    Anti-infectives: Anti-infectives     Start     Dose/Rate Route Frequency Ordered Stop   11/28/11 2130   ertapenem [facility]) 1 g in sodium chloride 0.9 % 50 mL IVPB        1 g 100 mL/hr over 30 Minutes Intravenous Every 24 hours 11/28/11 0201 12/05/11 2129   11/27/11 2130   ertapenem (INVANZ) 1 g in sodium chloride 0.9 % 50 mL IVPB  Status:  Discontinued        1 g 100 mL/hr  over 30 Minutes Intravenous Every 24 hours 11/27/11 2104 11/28/11 0201          Assessment Principal Problem:  *Appendicitis, acute, with peritonitis and probable perforation s/p lap appendectomy 3/12-on IV Invanz. Active Problems:  Postop hypoxia-appreciate Pulm. assistance  DIABETES MELLITUS, TYPE II  MIGRAINE HEADACHE  IBS  DYSPNEA  Spasmodic dysphonia      LOS: 2 days   Plan: Will not advance diet.  OOB.   Hayley [name] J 11/29/2011

## 2011-11-29 NOTE — Clinical Documentation Improvement (Signed)
RESPIRATORY FAILURE DOCUMENTATION CLARIFICATION QUERY   THIS DOCUMENT IS NOT A PERMANENT PART OF THE MEDICAL RECORD  TO RESPOND TO THE THIS QUERY, FOLLOW THE INSTRUCTIONS BELOW:  1. If needed, update documentation for the patient's encounter via the notes activity.  2. Access this query again and click edit on the In [employer].  3. After updating, or not, click F2 to complete all highlighted (required) fields concerning your review. Select "additional documentation in the medical record" OR "no additional documentation provided".  4. Click Sign note button.  5. The deficiency will fall out of your In Basket *Please let us know if you are not able to complete this workflow by phone or e-mail (listed below).  Please update your documentation within the medical record to reflect your response to this query.  May also state that the condition is resolving, resolved,etc                                                                                      11/29/11  Dear Dr. [facility],  In a better effort to capture your patient's severity of illness, reflect appropriate length of stay and utilization of resources, a review of the patient medical record has revealed the following indicators.    Based on your clinical judgment, please clarify and document in a progress note and/or discharge summary the clinical condition associated with the following supporting information:  In responding to this query please exercise your independent judgment.  The fact that a query is asked, does not imply that any particular answer is desired or expected.   Possible Clinical Conditions  _______Acute Respiratory Distress  _______Acute Respiratory Failure  _______Chronic Respiratory Failure  _______Other Condition  _______Cannot Clinically Determine    Supporting Information:  Risk Factors: Hx OSA with CPAP nocturnally Obesity Appendectomy/Pain  Management  Signs&Symptoms: "Decreased breath sounds in the bases" per assessment notes "Hypoxia"  Per notes "Respiratory Distress"  Per notes "She was tx to SDU 3/13 for hypoxia" per PCCM notes "She did not tolerate any configuration of Cpap and was then placed on 100% NRB"  per PCCM notes  Treatment: CPAP at HS NRB to Venturi Mask; now RA Documented O2 saturations from 11:00-13:00 (11/29/11)  91%  To 92% on RA Continous monitoring  Respiratory Treatment: Dulera Inhaler 2 puffs Daily                      You may use possible, probable, or suspect with inpatient documentation. possible, probable, suspected diagnoses MUST be documented at the time of discharge  Reviewed: Not answered  Thank You,    Rossie Muskrat RN, BSN Clinical Documentation Specialist Pager:  [phone] [email or web address]  Health Information Management [place]

## 2011-11-30 LAB — GLUCOSE, CAPILLARY
Glucose-Capillary: 113 mg/dL — ABNORMAL HIGH (ref 70–99)
Glucose-Capillary: 122 mg/dL — ABNORMAL HIGH (ref 70–99)
Glucose-Capillary: 131 mg/dL — ABNORMAL HIGH (ref 70–99)
Glucose-Capillary: 134 mg/dL — ABNORMAL HIGH (ref 70–99)
Glucose-Capillary: 162 mg/dL — ABNORMAL HIGH (ref 70–99)

## 2011-11-30 MED ORDER — CLONAZEPAM 0.5 MG PO TABS
0.5000 mg | ORAL_TABLET | Freq: Every day | ORAL | Status: DC
  Administered 2011-11-30 – 2011-12-10 (×11): 0.5 mg via ORAL
  Filled 2011-11-30 (×11): qty 1

## 2011-11-30 MED ORDER — BIOTENE DRY MOUTH MT LIQD
15.0000 mL | Freq: Two times a day (BID) | OROMUCOSAL | Status: DC
  Administered 2011-11-30 – 2011-12-11 (×18): 15 mL via OROMUCOSAL

## 2011-11-30 MED ORDER — BISACODYL 10 MG RE SUPP
10.0000 mg | Freq: Once | RECTAL | Status: AC
  Administered 2011-11-30: 10 mg via RECTAL
  Filled 2011-11-30: qty 1

## 2011-11-30 MED ORDER — CHLORHEXIDINE GLUCONATE 0.12 % MT SOLN
15.0000 mL | Freq: Two times a day (BID) | OROMUCOSAL | Status: DC
  Administered 2011-11-30 – 2011-12-11 (×20): 15 mL via OROMUCOSAL
  Filled 2011-11-30 (×27): qty 15

## 2011-11-30 NOTE — Progress Notes (Signed)
3 Days Post-Op  Subjective: Passed a little gas.  Feels bloated.  Sore.  Objective: Vital signs in last 24 hours: Temp:  [98.4 F (36.9 C)-101 F (38.3 C)] 100.3 F (37.9 C) (03/15 0800) Pulse Rate:  [102-125] 107  (03/15 0423) Resp:  [21-25] 21  (03/15 0423) BP: (103-117)/(45-68) 112/66 mmHg (03/15 0423) SpO2:  [89 %-94 %] 92 % (03/15 0423) Last BM Date: 11/26/11  Intake/Output from previous day: 03/14 0701 - 03/15 0700 In: 2425 [P.O.:100; I.V.:2275; IV Piggyback:50] Out: 1370 [Urine:1370] Intake/Output this shift:    PE: Abd-slightly firm and distended, incisions are clean and intact, hypoactive BS  Lab Results:   Basename 11/29/11 0335 11/27/11 1331  WBC 8.7 12.9*  HGB 11.9* 13.3  HCT 37.3 42.0  PLT 218 238   BMET  Basename 11/29/11 0335 11/27/11 1331  NA 140 137  K 3.5 3.8  CL 108 106  CO2 26 25  GLUCOSE 139* 127*  BUN 7 5*  CREATININE 0.71 0.62  CALCIUM 8.5 8.8   PT/INR No results found for this basename: LABPROT:2,INR:2 in the last 72 hours Comprehensive Metabolic Panel:    Component Value Date/Time   NA 140 11/29/2011 0335   K 3.5 11/29/2011 0335   CL 108 11/29/2011 0335   CO2 26 11/29/2011 0335   BUN 7 11/29/2011 0335   CREATININE 0.71 11/29/2011 0335   GLUCOSE 139* 11/29/2011 0335   CALCIUM 8.5 11/29/2011 0335   AST 14 11/27/2011 1331   ALT 8 11/27/2011 1331   ALKPHOS 62 11/27/2011 1331   BILITOT 0.5 11/27/2011 1331   PROT 7.1 11/27/2011 1331   ALBUMIN 3.5 11/27/2011 1331     Studies/Results: No results found.  Anti-infectives: Anti-infectives     Start     Dose/Rate Route Frequency Ordered Stop   11/28/11 2130   ertapenem [facility]) 1 g in sodium chloride 0.9 % 50 mL IVPB        1 g 100 mL/hr over 30 Minutes Intravenous Every 24 hours 11/28/11 0201 12/05/11 2129   11/27/11 2130   ertapenem (INVANZ) 1 g in sodium chloride 0.9 % 50 mL IVPB  Status:  Discontinued        1 g 100 mL/hr over 30 Minutes Intravenous Every 24 hours 11/27/11 2104  11/28/11 0201          Assessment Principal Problem:  *Appendicitis, acute, with peritonitis and probable perforation s/p lap appendectomy 3/12-on IV Invanz. Active Problems:  Some postop ileus  Postop hypoxia-appreciate Pulm. assistance  DIABETES MELLITUS, TYPE II  MIGRAINE HEADACHE  IBS  DYSPNEA  Spasmodic dysphonia      LOS: 3 days   Plan: Try clear liquids.  Ambulate.   Cherrelle Plante J 11/30/2011

## 2011-12-01 ENCOUNTER — Inpatient Hospital Stay (HOSPITAL_COMMUNITY): Payer: Medicare Other

## 2011-12-01 DIAGNOSIS — R141 Gas pain: Secondary | ICD-10-CM | POA: Diagnosis not present

## 2011-12-01 DIAGNOSIS — R143 Flatulence: Secondary | ICD-10-CM | POA: Diagnosis not present

## 2011-12-01 DIAGNOSIS — R109 Unspecified abdominal pain: Secondary | ICD-10-CM | POA: Diagnosis not present

## 2011-12-01 LAB — GLUCOSE, CAPILLARY
Glucose-Capillary: 109 mg/dL — ABNORMAL HIGH (ref 70–99)
Glucose-Capillary: 124 mg/dL — ABNORMAL HIGH (ref 70–99)
Glucose-Capillary: 128 mg/dL — ABNORMAL HIGH (ref 70–99)
Glucose-Capillary: 96 mg/dL (ref 70–99)

## 2011-12-01 LAB — BASIC METABOLIC PANEL
BUN: 4 mg/dL — ABNORMAL LOW (ref 6–23)
CO2: 26 mEq/L (ref 19–32)
Calcium: 8.2 mg/dL — ABNORMAL LOW (ref 8.4–10.5)
Chloride: 108 mEq/L (ref 96–112)
Creatinine, Ser: 0.62 mg/dL (ref 0.50–1.10)
GFR calc Af Amer: >90 mL/min (ref >90)
GFR calc non Af Amer: >90 mL/min (ref >90)
Glucose, Bld: 130 mg/dL — ABNORMAL HIGH (ref 70–99)
Potassium: 3.7 mEq/L (ref 3.5–5.1)
Sodium: 140 mEq/L (ref 135–145)

## 2011-12-01 LAB — CBC
HCT: 36.2 % (ref 36.0–46.0)
Hemoglobin: 11.5 g/dL — ABNORMAL LOW (ref 12.0–15.0)
MCH: 28 pg (ref 26.0–34.0)
MCHC: 31.8 g/dL (ref 30.0–36.0)
MCV: 88.1 fL (ref 78.0–100.0)
Platelets: 277 10*3/uL (ref 150–400)
RBC: 4.11 MIL/uL (ref 3.87–5.11)
RDW: 14.6 % (ref 11.5–15.5)
WBC: 6.7 10*3/uL (ref 4.0–10.5)

## 2011-12-01 MED ORDER — CYCLOBENZAPRINE HCL 5 MG PO TABS
5.0000 mg | ORAL_TABLET | Freq: Every day | ORAL | Status: DC
  Administered 2011-12-01 – 2011-12-10 (×10): 5 mg via ORAL
  Filled 2011-12-01 (×13): qty 1

## 2011-12-01 MED ORDER — LIDOCAINE HCL 2 % EX GEL
Freq: Once | CUTANEOUS | Status: AC
  Administered 2011-12-01: 1 via TOPICAL
  Filled 2011-12-01: qty 30

## 2011-12-01 NOTE — Progress Notes (Signed)
4 Days Post-Op  Subjective: C/o nausea and increasing abdominal distension.  No vomiting and has been taking clears.[name]  +BM yesterday.  Objective: Vital signs in last 24 hours: Temp:  [97.7 F (36.5 C)-99.4 F (37.4 C)] 99.4 F (37.4 C) (03/16 0810) Pulse Rate:  [97-114] 104  (03/16 0810) Resp:  [17-22] 22  (03/16 0810) BP: (103-130)/(50-78) 103/50 mmHg (03/16 0810) SpO2:  [88 %-95 %] 93 % (03/16 0810) Weight:  [239 lb 10.2 oz (108.7 kg)] 239 lb 10.2 oz (108.7 kg) (03/16 0500) Last BM Date: 11/30/11  Intake/Output from previous day: 03/15 0701 - 03/16 0700 In: 2340 [P.O.:240; I.V.:2050; IV Piggyback:50] Out: 1002 [Urine:1000; Stool:2] Intake/Output this shift: Total I/O In: 100 [I.V.:100] Out: 500 [Urine:500]  General appearance: alert, cooperative and no distress Resp: clear to auscultation bilaterally and her lungs are clear without wheeze or rales, but decreased excursion Cardio: mild tachycardia, regular rhythm GI: soft, minimal tenderness, but she is distended, wounds without infection, no peritonitis  Lab Results:   Basename 12/01/11 0352 11/29/11 0335  WBC 6.7 8.7  HGB 11.5* 11.9*  HCT 36.2 37.3  PLT 277 218   BMET  Basename 12/01/11 0352 11/29/11 0335  NA 140 140  K 3.7 3.5  CL 108 108  CO2 26 26  GLUCOSE 130* 139*  BUN 4* 7  CREATININE 0.62 0.71  CALCIUM 8.2* 8.5   PT/INR No results found for this basename: LABPROT:2,INR:2 in the last 72 hours ABG No results found for this basename: PHART:2,PCO2:2,PO2:2,HCO3:2 in the last 72 hours  Studies/Results: No results found.  Anti-infectives: Anti-infectives     Start     Dose/Rate Route Frequency Ordered Stop   11/28/11 2130   ertapenem (INVANZ) 1 g in sodium chloride 0.9 % 50 mL IVPB        1 g 100 mL/hr over 30 Minutes Intravenous Every 24 hours 11/28/11 0201 12/05/11 2129   11/27/11 2130   ertapenem (INVANZ) 1 g in sodium chloride 0.9 % 50 mL IVPB  Status:  Discontinued        1 g 100 mL/hr over  30 Minutes Intravenous Every 24 hours 11/27/11 2104 11/28/11 0201          Assessment/Plan: s/p Procedure(s) (LRB): APPENDECTOMY LAPAROSCOPIC (N/A) she seems to be doing okay but still not able to wean off oxygen due to poor lung excursion.  I think that this is exacerbated by abdominal distension.  I have ordered xray abdomen and would consider NG until distension improves.  Her bowel sounds are hypoactive and scant despite having BM yesterday.  LOS: 4 days    Lodema Pilot DAVID 12/01/2011

## 2011-12-01 NOTE — Progress Notes (Signed)
Patient off unit for DG ABD 2 view.

## 2011-12-02 ENCOUNTER — Inpatient Hospital Stay (HOSPITAL_COMMUNITY): Payer: Medicare Other

## 2011-12-02 DIAGNOSIS — K6389 Other specified diseases of intestine: Secondary | ICD-10-CM | POA: Diagnosis not present

## 2011-12-02 DIAGNOSIS — K37 Unspecified appendicitis: Secondary | ICD-10-CM | POA: Diagnosis not present

## 2011-12-02 DIAGNOSIS — R109 Unspecified abdominal pain: Secondary | ICD-10-CM | POA: Diagnosis not present

## 2011-12-02 LAB — GLUCOSE, CAPILLARY
Glucose-Capillary: 106 mg/dL — ABNORMAL HIGH (ref 70–99)
Glucose-Capillary: 124 mg/dL — ABNORMAL HIGH (ref 70–99)

## 2011-12-02 NOTE — Progress Notes (Addendum)
5 Days Post-Op  Subjective: Feeling much better today.  Abdominal pain and distension improved.  Breathing improved. +BMx3, +flatus  Objective: Vital signs in last 24 hours: Temp:  [98.1 F (36.7 C)-99 F (37.2 C)] 98.7 F (37.1 C) (03/17 0800) Pulse Rate:  [94-116] 104  (03/17 0423) Resp:  [19-28] 22  (03/17 0423) BP: (122-129)/(66-75) 123/70 mmHg (03/17 0400) SpO2:  [86 %-95 %] 90 % (03/17 0423) Weight:  [238 lb 15.7 oz (108.4 kg)] 238 lb 15.7 oz (108.4 kg) (03/17 0000) Last BM Date: 11/30/11  Intake/Output from previous day: 2022-12-31 0701 - 03/17 0700 In: 2430 [P.O.:360; I.V.:2000; IV Piggyback:10] Out: 2927 [Urine:2800; Emesis/NG output:125; Stool:2] Intake/Output this shift: Total I/O In: -  Out: 801 [Urine:800; Stool:1]  General appearance: alert, cooperative and no distress Resp: clear to auscultation bilaterally and breathing much better today, breathing comfortably, 1.5L [place] Cardio: normal rate, regular rhythm GI: soft, minimal lower abdominal tenderness, distension improved, 100-2102ml from NG, wounds okay, no peritonitis  Lab Results:   [facility] Dec 31, 2011 0352  WBC 6.7  HGB 11.5*  HCT 36.2  PLT 277   BMET  Basename December 31, 2011 0352  NA 140  K 3.7  CL 108  CO2 26  GLUCOSE 130*  BUN 4*  CREATININE 0.62  CALCIUM 8.2*   PT/INR No results found for this basename: LABPROT:2,INR:2 in the last 72 hours ABG No results found for this basename: PHART:2,PCO2:2,PO2:2,HCO3:2 in the last 72 hours  Studies/Results: Dg Abd 2 Views  December 31, 2011  *RADIOLOGY REPORT*  Clinical Data: Abdominal pain and distention.  Recent appendectomy.  ABDOMEN - 2 VIEW  Comparison: CT scan dated 11/27/2011  Findings: There is fairly extensive air in the bowel with multiple dilated loops of small bowel.  The colon is not distended.  The patient has a fairly severe lumbar rotoscoliosis.  No free air.  IMPRESSION: Dilated small bowel loops which could represent a focal ileus or partial small bowel  obstruction.  Does the patient have hyperactive bowel sounds?  Original Report Authenticated By: Gwynn Burly, M.D.    Anti-infectives: Anti-infectives     Start     Dose/Rate Route Frequency Ordered Stop   11/28/11 2130   ertapenem (INVANZ) 1 g in sodium chloride 0.9 % 50 mL IVPB        1 g 100 mL/hr over 30 Minutes Intravenous Every 24 hours 11/28/11 0201 12/05/11 2129   11/27/11 2130   ertapenem (INVANZ) 1 g in sodium chloride 0.9 % 50 mL IVPB  Status:  Discontinued        1 g 100 mL/hr over 30 Minutes Intravenous Every 24 hours 11/27/11 2104 11/28/11 0201          Assessment/Plan: s/p Procedure(s) (LRB): APPENDECTOMY LAPAROSCOPIC (N/A) will repeat xray and if it looks improved, will consider removal of NG tube.  I think that she looks stable enough for transfer to floor.mobilize   LOS: 5 days    Hayley [name] 12/02/2011  Xray improved but still slight dilation of small bowel loops.  Leave ng for today since she will not likely allow replacement once this is out.

## 2011-12-02 NOTE — Plan of Care (Signed)
Problem: Phase I Progression Outcomes Goal: Initial discharge plan identified Outcome: Completed/Met Date Met:  12/02/11 Plan for D/C is return to home

## 2011-12-03 DIAGNOSIS — K219 Gastro-esophageal reflux disease without esophagitis: Secondary | ICD-10-CM | POA: Diagnosis not present

## 2011-12-03 DIAGNOSIS — R109 Unspecified abdominal pain: Secondary | ICD-10-CM | POA: Diagnosis not present

## 2011-12-03 DIAGNOSIS — K560 Paralytic ileus: Secondary | ICD-10-CM | POA: Diagnosis not present

## 2011-12-03 DIAGNOSIS — R933 Abnormal findings on diagnostic imaging of other parts of digestive tract: Secondary | ICD-10-CM | POA: Diagnosis not present

## 2011-12-03 LAB — GLUCOSE, CAPILLARY
Glucose-Capillary: 101 mg/dL — ABNORMAL HIGH (ref 70–99)
Glucose-Capillary: 104 mg/dL — ABNORMAL HIGH (ref 70–99)
Glucose-Capillary: 114 mg/dL — ABNORMAL HIGH (ref 70–99)
Glucose-Capillary: 115 mg/dL — ABNORMAL HIGH (ref 70–99)
Glucose-Capillary: 119 mg/dL — ABNORMAL HIGH (ref 70–99)

## 2011-12-03 MED ORDER — ONDANSETRON HCL 4 MG PO TABS
4.0000 mg | ORAL_TABLET | Freq: Four times a day (QID) | ORAL | Status: DC | PRN
  Administered 2011-12-03 – 2011-12-09 (×2): 4 mg via ORAL
  Filled 2011-12-03 (×2): qty 1

## 2011-12-03 MED ORDER — LEVOTHYROXINE SODIUM 25 MCG PO TABS
25.0000 ug | ORAL_TABLET | Freq: Every day | ORAL | Status: DC
  Administered 2011-12-04 – 2011-12-11 (×8): 25 ug via ORAL
  Filled 2011-12-03 (×9): qty 1

## 2011-12-03 MED ORDER — VITAMINS A & D EX OINT
TOPICAL_OINTMENT | CUTANEOUS | Status: AC
  Administered 2011-12-03: 5
  Filled 2011-12-03: qty 5

## 2011-12-03 MED ORDER — VITAMINS A & D EX OINT
TOPICAL_OINTMENT | CUTANEOUS | Status: AC
  Administered 2011-12-03: 16:00:00
  Filled 2011-12-03: qty 5

## 2011-12-03 MED ORDER — ALBUTEROL SULFATE HFA 108 (90 BASE) MCG/ACT IN AERS
2.0000 | INHALATION_SPRAY | Freq: Four times a day (QID) | RESPIRATORY_TRACT | Status: DC | PRN
  Administered 2011-12-05 – 2011-12-06 (×3): 2 via RESPIRATORY_TRACT
  Filled 2011-12-03: qty 6.7

## 2011-12-03 MED ORDER — MOMETASONE FURO-FORMOTEROL FUM 100-5 MCG/ACT IN AERO
2.0000 | INHALATION_SPRAY | Freq: Two times a day (BID) | RESPIRATORY_TRACT | Status: DC
  Administered 2011-12-03 – 2011-12-11 (×14): 2 via RESPIRATORY_TRACT
  Filled 2011-12-03: qty 13

## 2011-12-03 MED ORDER — NON FORMULARY: 2.0000 | Freq: Two times a day (BID) | Status: DC

## 2011-12-03 NOTE — Progress Notes (Signed)
Very poor venous access/multiple IV attempts without success/floor RN aware/reccommend PICC. Ronette Deter, RN

## 2011-12-03 NOTE — Progress Notes (Signed)
Dr Andrey Campanile notified of patient not currently having any iv access. Orders received to change nausea meds to po and place order for PICC line.

## 2011-12-03 NOTE — Consult Note (Signed)
Reason for Consult: Severe abdominal pain. Referring Physician: CCS  Hayley [name] is an 66 y.o. female.  HPI: Hayley [name] is s/p laparoscopic appendectomy for acute appendicitis. She is very upset about the fact that her NGT has still not been removed. She describes pelvic pain and shortness of breath. Wants her breathing treatment and PT done. Had 4 small volume BM's today with no obvious blood or mucus in the stool. She is concerned that the pelvic pain she has had is probably due to "something else".    Past Medical History  Diagnosis Date  . Hyperthyroidism   . Toxic goiter   . Reflux   . Colon polyps   . Osteoarthritis     in back  . Migraines   . Disorder of vocal cords     spasmotic dysphonia  . Obesity, Class III, BMI 40-49.9 (morbid obesity) 04/27/2010  . Irritable bowel syndrome . Anxiety & depression .Cholelithiasis .Nephrolithiasis .Pandiverticulosis .Morbid obesity .NASH  Past Surgical History  Procedure Date  . Abdominal hysterectomy   . [place] diverticulum   . Kidney stone surgery     History reviewed. No pertinent family history.  Social History:  reports that she has never smoked. She has never used smokeless tobacco. She reports that she does not drink alcohol or use illicit drugs.  Allergies:  Allergies  Allergen Reactions  . Bee Anaphylaxis  . Etodolac Nausea And Vomiting  . Morphine Other (See Comments)    Hallucinations  . Propoxyphene N-Acetaminophen Nausea And Vomiting  . Penicillins Rash    Medications: I have reviewed the patient's current medications.  Results for orders placed during the hospital encounter of 11/27/11 (from the past 48 hour(s))  GLUCOSE, CAPILLARY     Status: Abnormal   Collection Time   12/01/11  6:35 PM      Component Value Range Comment   Glucose-Capillary 109 (*) 70 - 99 (mg/dL)   GLUCOSE, CAPILLARY     Status: Normal   Collection Time   12/01/11 11:55 PM      Component Value Range Comment   Glucose-Capillary 96   70 - 99 (mg/dL)    Comment 1 Notify RN      Comment 2 Documented in Chart     GLUCOSE, CAPILLARY     Status: Abnormal   Collection Time   12/02/11  1:11 PM      Component Value Range Comment   Glucose-Capillary 106 (*) 70 - 99 (mg/dL)   GLUCOSE, CAPILLARY     Status: Abnormal   Collection Time   12/02/11  7:16 PM      Component Value Range Comment   Glucose-Capillary 124 (*) 70 - 99 (mg/dL)   GLUCOSE, CAPILLARY     Status: Abnormal   Collection Time   12/02/11 11:59 PM      Component Value Range Comment   Glucose-Capillary 104 (*) 70 - 99 (mg/dL)    Comment 1 Documented in Chart      Comment 2 Notify RN     GLUCOSE, CAPILLARY     Status: Abnormal   Collection Time   12/03/11  6:12 AM      Component Value Range Comment   Glucose-Capillary 119 (*) 70 - 99 (mg/dL)   GLUCOSE, CAPILLARY     Status: Abnormal   Collection Time   12/03/11  7:54 AM      Component Value Range Comment   Glucose-Capillary 115 (*) 70 - 99 (mg/dL)   GLUCOSE, CAPILLARY  Status: Abnormal   Collection Time   12/03/11 12:00 PM      Component Value Range Comment   Glucose-Capillary 114 (*) 70 - 99 (mg/dL)   GLUCOSE, CAPILLARY     Status: Abnormal   Collection Time   12/03/11  5:09 PM      Component Value Range Comment   Glucose-Capillary 101 (*) 70 - 99 (mg/dL)     Dg Abd 2 Views  1/61/0960  *RADIOLOGY REPORT*  Clinical Data: Abdominal pain.  Recent appendicitis.  Dilated small bowel.  ABDOMEN - 2 VIEW  Comparison: Radiographs dated 12/01/2011  Findings: An NG tube has been inserted and the tip is in the distal stomach.  There are persistent dilated loops of small bowel in the mid abdomen.  The colon is not distended.  No free air.  IMPRESSION: Persistent distention of small bowel.  Stomach is decompressed with a NG tube in place.  Original Report Authenticated By: Gwynn Burly, M.D.    Review of Systems  Constitutional: Positive for malaise/fatigue. Negative for fever, chills, weight loss and  diaphoresis.  HENT: Negative for hearing loss, ear pain, nosebleeds, congestion, sore throat, neck pain, tinnitus and ear discharge.   Respiratory: Positive for shortness of breath and wheezing. Negative for cough, hemoptysis, sputum production and stridor.   Gastrointestinal: Positive for heartburn, nausea, abdominal pain and diarrhea. Negative for vomiting and constipation.  Genitourinary: Negative for dysuria, urgency, frequency, hematuria and flank pain.  Musculoskeletal: Positive for myalgias, back pain and joint pain.  Skin: Negative for itching and rash.  Neurological: Positive for dizziness, weakness and headaches. Negative for tingling, tremors, sensory change, speech change, focal weakness, seizures and loss of consciousness.  Endo/Heme/Allergies: Negative for environmental allergies and polydipsia. Does not bruise/bleed easily.  Psychiatric/Behavioral: Positive for depression. Negative for suicidal ideas, hallucinations, memory loss and substance abuse. The patient is nervous/anxious. The patient does not have insomnia.    Blood pressure 118/69, pulse 78, temperature 97.8 F (36.6 C), temperature source Oral, resp. rate 18, height 5\' 6"  (1.676 m), weight 105.7 kg (233 lb 0.4 oz), SpO2 100.00%. Physical Exam  Constitutional: She is oriented to person, place, and time. She appears well-developed and well-nourished.  HENT:  Head: Normocephalic and atraumatic.  Eyes: Conjunctivae and EOM are normal. Pupils are equal, round, and reactive to light.  Neck: Normal range of motion. Neck supple. No JVD present. No tracheal deviation present. No thyromegaly present.  Cardiovascular: Normal rate and regular rhythm.   Respiratory: Effort normal and breath sounds normal.  GI: She exhibits distension. She exhibits no mass. Bowel sounds are absent. There is no hepatosplenomegaly. There is tenderness in the periumbilical area and suprapubic area. There is guarding. There is no rebound and no CVA  tenderness. No hernia. Hernia confirmed negative in the ventral area.  Musculoskeletal: Normal range of motion.  Lymphadenopathy:    She has no cervical adenopathy.  Neurological: She is alert and oriented to person, place, and time. She has normal reflexes.  Skin: Skin is warm and dry.  Psychiatric: She has a normal mood and affect. Her behavior is normal. Judgment and thought content normal.    Assessment/Plan: 1) Abdominal pain ?ileus s/p laparoscopic appendectomy: Encourage ambulation [will need PT] and minimize narcotic use. Monitor serial abdominal films. I have reassured the patient that all her needs will be addressed. I have had a discussion with her nurse about this as well. If her abdominal pain persists after her ileus resolves, she may benefit from an  MRE.  2) GERD: STOP all NSAIDS including Naproxen [patient is on PPI's].  3) NASH.  4) Pandiverticulosis. Last colonoscopy and EGD were done in 7/11.  5) Morbid obesity. 6) Anxiety and depression.  Kimra Kantor 12/03/2011, 5:46 PM

## 2011-12-03 NOTE — Progress Notes (Signed)
6 Days Post-Op  Subjective: Pt reports she had large BM this am in the bed as she could not get up to small [facility]  She has requested that Dr. Loreta Ave follow her while here.  She has had scant NGT OP and is having BM's Objective: Vital signs in last 24 hours: Temp:  [97.6 F (36.4 C)-99.4 F (37.4 C)] 98.4 F (36.9 C) (03/18 0615) Pulse Rate:  [95-108] 95  (03/18 0615) Resp:  [19-30] 22  (03/18 0615) BP: (113-129)/(45-73) 113/72 mmHg (03/18 0615) SpO2:  [91 %-98 %] 91 % (03/18 0615) Weight:  [105.7 kg (233 lb 0.4 oz)] 105.7 kg (233 lb 0.4 oz) (03/18 0111) Last BM Date: 2011/12/16  Intake/Output from previous day: 12-16-2022 0701 - 03/18 0700 In: 2055 [I.V.:2005; IV Piggyback:50] Out: 1301 [Urine:1250; Emesis/NG output:50; Stool:1] Intake/Output this shift:    General appearance: alert, cooperative, no distress and slowed mentation Resp: clear to auscultation bilaterally Cardio: regular rate and rhythm GI: +BS, obese, soft, mildly tender about R groin/ suprapubic area  Lab Results:   [facility] 12/01/11 0352  WBC 6.7  HGB 11.5*  HCT 36.2  PLT 277   BMET  Basename 12/01/11 0352  NA 140  K 3.7  CL 108  CO2 26  GLUCOSE 130*  BUN 4*  CREATININE 0.62  CALCIUM 8.2*   PT/INR No results found for this basename: LABPROT:2,INR:2 in the last 72 hours ABG No results found for this basename: PHART:2,PCO2:2,PO2:2,HCO3:2 in the last 72 hours  Studies/Results: Dg Abd 2 Views  12-16-11  *RADIOLOGY REPORT*  Clinical Data: Abdominal pain.  Recent appendicitis.  Dilated small bowel.  ABDOMEN - 2 VIEW  Comparison: Radiographs dated 12/01/2011  Findings: An NG tube has been inserted and the tip is in the distal stomach.  There are persistent dilated loops of small bowel in the mid abdomen.  The colon is not distended.  No free air.  IMPRESSION: Persistent distention of small bowel.  Stomach is decompressed with a NG tube in place.  Original Report Authenticated By: Gwynn Burly, M.D.   Dg  Abd 2 Views  12/01/2011  *RADIOLOGY REPORT*  Clinical Data: Abdominal pain and distention.  Recent appendectomy.  ABDOMEN - 2 VIEW  Comparison: CT scan dated 11/27/2011  Findings: There is fairly extensive air in the bowel with multiple dilated loops of small bowel.  The colon is not distended.  The patient has a fairly severe lumbar rotoscoliosis.  No free air.  IMPRESSION: Dilated small bowel loops which could represent a focal ileus or partial small bowel obstruction.  Does the patient have hyperactive bowel sounds?  Original Report Authenticated By: Gwynn Burly, M.D.    Anti-infectives: Anti-infectives     Start     Dose/Rate Route Frequency Ordered Stop   11/28/11 2130   ertapenem (INVANZ) 1 g in sodium chloride 0.9 % 50 mL IVPB        1 g 100 mL/hr over 30 Minutes Intravenous Every 24 hours 11/28/11 0201 12/05/11 2129   11/27/11 2130   ertapenem (INVANZ) 1 g in sodium chloride 0.9 % 50 mL IVPB  Status:  Discontinued        1 g 100 mL/hr over 30 Minutes Intravenous Every 24 hours 11/27/11 2104 11/28/11 0201          Assessment/Plan: s/p Procedure(s) (LRB): APPENDECTOMY LAPAROSCOPIC (N/A) Continue antibiotics- INVANZ- Day #7 Trial NGT clamping and hopefully DC later today if tolerates well Resume Synthroid May not want to resume Topamax at  this point as often causes anorexia/nausea and may confuse situation Call Dr. Arty Baumgartner per pt request for Dr. Loreta Ave to follow while here. Ask PT to see for mobility issues   LOS: 6 days    Orpheus [place],PA-C Pager [phone] General Trauma Pager [phone]

## 2011-12-03 NOTE — Progress Notes (Signed)
Patient transferred to 1420 via wheel chair with her personal belongings at 60. Report given to receiving nurse.

## 2011-12-04 DIAGNOSIS — K219 Gastro-esophageal reflux disease without esophagitis: Secondary | ICD-10-CM | POA: Diagnosis not present

## 2011-12-04 DIAGNOSIS — R933 Abnormal findings on diagnostic imaging of other parts of digestive tract: Secondary | ICD-10-CM | POA: Diagnosis not present

## 2011-12-04 DIAGNOSIS — R109 Unspecified abdominal pain: Secondary | ICD-10-CM | POA: Diagnosis not present

## 2011-12-04 DIAGNOSIS — K560 Paralytic ileus: Secondary | ICD-10-CM | POA: Diagnosis not present

## 2011-12-04 LAB — GLUCOSE, CAPILLARY
Glucose-Capillary: 107 mg/dL — ABNORMAL HIGH (ref 70–99)
Glucose-Capillary: 119 mg/dL — ABNORMAL HIGH (ref 70–99)
Glucose-Capillary: 82 mg/dL (ref 70–99)
Glucose-Capillary: 96 mg/dL (ref 70–99)

## 2011-12-04 MED ORDER — SACCHAROMYCES BOULARDII 250 MG PO CAPS
250.0000 mg | ORAL_CAPSULE | Freq: Two times a day (BID) | ORAL | Status: DC
  Administered 2011-12-04 – 2011-12-11 (×14): 250 mg via ORAL
  Filled 2011-12-04 (×17): qty 1

## 2011-12-04 MED ORDER — LIDOCAINE HCL 2 % EX GEL
Freq: Three times a day (TID) | CUTANEOUS | Status: DC
  Administered 2011-12-04 – 2011-12-11 (×3)
  Filled 2011-12-04 (×4): qty 5
  Filled 2011-12-04: qty 30
  Filled 2011-12-04 (×6): qty 5

## 2011-12-04 MED ORDER — PANTOPRAZOLE SODIUM 40 MG PO TBEC
40.0000 mg | DELAYED_RELEASE_TABLET | Freq: Every day | ORAL | Status: DC
  Administered 2011-12-04 – 2011-12-11 (×8): 40 mg via ORAL
  Filled 2011-12-04 (×9): qty 1

## 2011-12-04 MED ORDER — SODIUM CHLORIDE 0.9 % IJ SOLN
10.0000 mL | INTRAMUSCULAR | Status: DC | PRN
  Administered 2011-12-06: 10 mL

## 2011-12-04 NOTE — Progress Notes (Signed)
PHARMACIST - PHYSICIAN COMMUNICATION DR:   Daphine Deutscher CONCERNING: IV to Oral Route Change Policy  RECOMMENDATION: This patient is receiving Protonix by the intravenous route.  Based on criteria approved by the Pharmacy and Therapeutics Committee,it is being converted to the equivalent oral dose form(s).   DESCRIPTION:  The patient is eating (either orally or via tube) and/or has been taking other orally administered medications for a least 24 hours  The patient is improving clinically and has a Tmax < 100.5  The patient is without IV access at this time  Per the RN, patient is tolerating PO meds  If you have questions about this conversion, please contact the Pharmacy Department  []   [phone] )  [facility] PharmD, [place] Pager [phone] 12/04/2011 12:05 PM

## 2011-12-04 NOTE — Progress Notes (Signed)
Patient ID: Hayley [name], female   DOB: [birth year], 66 y.o.   MRN: [id number] [facility] Progress Note:   7 Days Post-Op  Subjective: Mental status is clear Objective: Vital signs in last 24 hours: Temp:  [97.8 F (36.6 C)-98.9 F (37.2 C)] 98.9 F (37.2 C) (03/19 0556) Pulse Rate:  [78-103] 103  (03/19 0556) Resp:  [18-19] 19  (03/19 0556) BP: (110-131)/(69-73) 110/73 mmHg (03/19 0556) SpO2:  [84 %-100 %] 93 % (03/19 0556)  Intake/Output from previous day:   Intake/Output this shift:    Physical Exam: Work of breathing is  Normal.  NG has been clamped greater than 24 hrs.  + flatus and bm.    Lab Results:  Results for orders placed during the hospital encounter of 11/27/11 (from the past 48 hour(s))  GLUCOSE, CAPILLARY     Status: Abnormal   Collection Time   12/02/11  1:11 PM      Component Value Range Comment   Glucose-Capillary 106 (*) 70 - 99 (mg/dL)   GLUCOSE, CAPILLARY     Status: Abnormal   Collection Time   12/02/11  7:16 PM      Component Value Range Comment   Glucose-Capillary 124 (*) 70 - 99 (mg/dL)   GLUCOSE, CAPILLARY     Status: Abnormal   Collection Time   12/02/11 11:59 PM      Component Value Range Comment   Glucose-Capillary 104 (*) 70 - 99 (mg/dL)    Comment 1 Documented in Chart      Comment 2 Notify RN     GLUCOSE, CAPILLARY     Status: Abnormal   Collection Time   12/03/11  6:12 AM      Component Value Range Comment   Glucose-Capillary 119 (*) 70 - 99 (mg/dL)   GLUCOSE, CAPILLARY     Status: Abnormal   Collection Time   12/03/11  7:54 AM      Component Value Range Comment   Glucose-Capillary 115 (*) 70 - 99 (mg/dL)   GLUCOSE, CAPILLARY     Status: Abnormal   Collection Time   12/03/11 12:00 PM      Component Value Range Comment   Glucose-Capillary 114 (*) 70 - 99 (mg/dL)   GLUCOSE, CAPILLARY     Status: Abnormal   Collection Time   12/03/11  5:09 PM      Component Value Range Comment   Glucose-Capillary 101 (*) 70 - 99 (mg/dL)     GLUCOSE, CAPILLARY     Status: Normal   Collection Time   12/04/11  5:58 AM      Component Value Range Comment   Glucose-Capillary 82  70 - 99 (mg/dL)     Radiology/Results: Dg Abd 2 Views  12/02/2011  *RADIOLOGY REPORT*  Clinical Data: Abdominal pain.  Recent appendicitis.  Dilated small bowel.  ABDOMEN - 2 VIEW  Comparison: Radiographs dated 12/01/2011  Findings: An NG tube has been inserted and the tip is in the distal stomach.  There are persistent dilated loops of small bowel in the mid abdomen.  The colon is not distended.  No free air.  IMPRESSION: Persistent distention of small bowel.  Stomach is decompressed with a NG tube in place.  Original Report Authenticated By: Gwynn Burly, M.D.    Anti-infectives: Anti-infectives     Start     Dose/Rate Route Frequency Ordered Stop   11/28/11 2130   ertapenem (INVANZ) 1 g in sodium chloride 0.9 % 50 mL IVPB  1 g 100 mL/hr over 30 Minutes Intravenous Every 24 hours 11/28/11 0201 12/05/11 2129   11/27/11 2130   ertapenem (INVANZ) 1 g in sodium chloride 0.9 % 50 mL IVPB  Status:  Discontinued        1 g 100 mL/hr over 30 Minutes Intravenous Every 24 hours 11/27/11 2104 11/28/11 0201          Assessment/Plan: Problem List: Patient Active Problem List  Diagnoses  . DIABETES MELLITUS, TYPE II  . Obesity, Class III, BMI 40-49.9 (morbid obesity)  . ANEMIA  . ANXIETY DEPRESSION  . DEPRESSION  . MIGRAINE HEADACHE  . NEUROPATHY  . HEMORRHOIDS  . GERD  . DIVERTICULOSIS, COLON  . IBS  . CHOLELITHIASIS  . RENAL CALCULUS  . DIAPHORESIS  . DYSPNEA  . HYPOXEMIA  . Appendicitis, acute, with peritonitis  . Spasmodic dysphonia    Appreciate Dr. Kenna Gilbert consult.  Will d/c ng and offer clear liquids.  7 Days Post-Op    LOS: 7 days   Matt B. Daphine Deutscher, MD, [facility] [phone] beeper [phone]  12/04/2011 9:40 AM

## 2011-12-04 NOTE — Progress Notes (Signed)
Subjective: Since I last evaluated the patient, she seems to have improved. The NGT has been removed but she has ongoing pelvic pain, which she feels is intermittent and can get to an intensity of 8/10, at its worst. There are no specific relieving or aggravating factors. She has a fairly good appetite and is tolerating clears well. Not ready to go home till the pelvic pain has been worked up. She has sen Dr. Rica Mote in the past and reportedly Dr.Suzanne Hyacinth Meeker has done a pelvic ultrasound withjn the last year. Four loose BM's today.  Objective: Vital signs in last 24 hours: Temp:  [98.1 F (36.7 C)-98.9 F (37.2 C)] 98.1 F (36.7 C) (03/19 1435) Pulse Rate:  [102-107] 107  (03/19 1435) Resp:  [18-19] 19  (03/19 1435) BP: (110-131)/(70-81) 129/81 mmHg (03/19 1435) SpO2:  [83 %-95 %] 83 % (03/19 1435) Last BM Date: 12/04/11  General appearance: alert, cooperative, appears stated age, fatigued, no distress and morbidly obese Resp: clear to auscultation bilaterally Cardio: regular rate and rhythm, S1, S2 normal, no murmur, click, rub or gallop GI: soft, morbidly obese , non-tender with decreased bowel sounds; no masses,  no organomegaly Extremities: extremities normal, atraumatic, no cyanosis or edema  Lab Results: No results found for this basename: WBC:3,HGB:3,HCT:3,PLT:3 in the last 72 hours BMET No results found for this basename: NA:3,K:3,CL:3,CO2:3,GLUCOSE:3,BUN:3,CREATININE:3,CALCIUM:3 in the last 72 hours  Medications: I have reviewed the patient's current medications.  Assessment/Plan: 1) Post-op ileus: improving.  2) Pelvic pain: Etiology unclear ?neuropathy.  3) S/P appendectomy for acute appendicitis with peritonitis. 4) GERD: On Pantoprazole.  5) Diverticulosis.  6) Fatty liver.  7) Sleep apnea: on C-PAP. . LOS: 7 days   Hayley [name] 12/04/2011, 8:48 PM

## 2011-12-04 NOTE — Progress Notes (Signed)
Spoke with patient regarding cpap.  Pt has home cpap unit from home with full face mask.  2lo2 bled in. Pt will place it on herself when ready.  Pt advised that RT available all night should she need assistance. RN aware.

## 2011-12-04 NOTE — Evaluation (Signed)
Physical Therapy Evaluation Patient Details Name: Hayley [name] MRN: [id number] DOB: October 15, 1945 Today's Date: 12/04/2011  Problem List:  Patient Active Problem List  Diagnoses  . DIABETES MELLITUS, TYPE II  . Obesity, Class III, BMI 40-49.9 (morbid obesity)  . ANEMIA  . ANXIETY DEPRESSION  . DEPRESSION  . MIGRAINE HEADACHE  . NEUROPATHY  . HEMORRHOIDS  . GERD  . DIVERTICULOSIS, COLON  . IBS  . CHOLELITHIASIS  . RENAL CALCULUS  . DIAPHORESIS  . DYSPNEA  . HYPOXEMIA  . Appendicitis, acute, with peritonitis  . Spasmodic dysphonia    Past Medical History:  Past Medical History  Diagnosis Date  . Hyperthyroidism   . Toxic goiter   . Reflux   . Colon polyps   . Osteoarthritis     in back  . Migraines   . Disorder of vocal cords     spasmotic dysphonia  . Obesity, Class III, BMI 40-49.9 (morbid obesity) 04/27/2010   Past Surgical History:  Past Surgical History  Procedure Date  . Abdominal hysterectomy   . Zenkers diverticulum   . Kidney stone surgery     PT Assessment/Plan/Recommendation PT Assessment Clinical Impression Statement: Pt presents with acute appendicitis with peritonitis s/p laparoscopic appendectomy with decreased overall strength, mobility and endurance. Pt tolerated ambulation well with intermittent HHA.  Noted that pt sats dropped to 86% with ambulation, however with cuing for pursed lip breathing, sats went back to 90's on room air.  Pt will benefit from skilled PT in acute venue to address deficits.  PT recommends no follow up therapy once D/C'd.    PT Recommendation/Assessment: Patient will need skilled PT in the acute care venue PT Problem List: Decreased strength;Decreased activity tolerance;Decreased balance;Decreased mobility;Pain Barriers to Discharge: None PT Therapy Diagnosis : Difficulty walking;Generalized weakness;Acute pain;Abnormality of gait PT Plan PT Frequency: Min 3X/week PT Treatment/Interventions: DME instruction;Gait  training;Stair training;Functional mobility training;Therapeutic activities;Therapeutic exercise;Balance training;Patient/family education PT Recommendation Follow Up Recommendations: No PT follow up Equipment Recommended: None recommended by PT PT Goals  Acute Rehab PT Goals PT Goal Formulation: With patient Time For Goal Achievement: 2 weeks Pt will go Supine/Side to Sit: Independently PT Goal: Supine/Side to Sit - Progress: Goal set today Pt will go Sit to Supine/Side: Independently PT Goal: Sit to Supine/Side - Progress: Goal set today Pt will go Sit to Stand: Independently PT Goal: Sit to Stand - Progress: Goal set today Pt will go Stand to Sit: Independently PT Goal: Stand to Sit - Progress: Goal set today Pt will Ambulate: 51 - 150 feet;with modified independence;with least restrictive assistive device PT Goal: Ambulate - Progress: Goal set today Pt will Go Up / Down Stairs: 3-5 stairs;with modified independence;with least restrictive assistive device;with rail(s) PT Goal: Up/Down Stairs - Progress: Goal set today Pt will Perform Home Exercise Program: with supervision, verbal cues required/provided PT Goal: Perform Home Exercise Program - Progress: Goal set today  PT Evaluation Precautions/Restrictions  Precautions Required Braces or Orthoses: No Restrictions Weight Bearing Restrictions: No Prior Functioning  Home Living Lives With: Spouse Receives Help From: Family Home Layout: Multi-level;Able to live on main level with bedroom/bathroom Alternate Level Stairs-Rails: Can reach both Alternate Level Stairs-Number of Steps: Flight Home Access: Stairs to enter Entrance Stairs-Rails: Left Entrance Stairs-Number of Steps: 4 Home Adaptive Equipment: None Prior Function Level of Independence: Independent with basic ADLs;Independent with gait;Independent with transfers Comments: Was doing everything on her own up until past few weeks.   Cognition Cognition Arousal/Alertness: Awake/alert Overall Cognitive Status: Appears  within functional limits for tasks assessed Orientation Level: Oriented X4 Sensation/Coordination Sensation Light Touch: Appears Intact Coordination Gross Motor Movements are Fluid and Coordinated: Yes Extremity Assessment RLE Assessment RLE Assessment: Within Functional Limits LLE Assessment LLE Assessment: Within Functional Limits Mobility (including Balance) Bed Mobility Bed Mobility: Yes Supine to Sit: 5: Supervision;HOB elevated (Comment degrees) Supine to Sit Details (indicate cue type and reason): Supervision for safety.  Transfers Transfers: Yes Sit to Stand: 4: Min assist;From elevated surface;With upper extremity assist;From bed Sit to Stand Details (indicate cue type and reason): Supervision and cues for safety and hand placement.  Stand to Sit: 5: Supervision;With armrests;With upper extremity assist;To chair/3-in-1 Stand to Sit Details: supervision and cues for safety and hand placement.  Ambulation/Gait Ambulation/Gait: Yes Ambulation/Gait Assistance: 4: Min assist Ambulation/Gait Assistance Details (indicate cue type and reason): Min/guard for safety with cues for safety and to take rest breaks as needed due to pt stating she has not ambulated in 2 weeks.  Ambulation Distance (Feet): 180 Feet Assistive device: 1 person hand held assist (Intermittent HHA) Gait Pattern: Step-through pattern (narrow BOS) Gait velocity: Decreased Stairs: No Wheelchair Mobility Wheelchair Mobility: No    Exercise    End of Session PT - End of Session Equipment Utilized During Treatment: Gait belt Activity Tolerance: Patient tolerated treatment well;Patient limited by fatigue Patient left: in chair;with call bell in reach Nurse Communication: Mobility status for transfers;Mobility status for ambulation General Behavior During Session: [facility] for tasks performed Cognition: [facility] for tasks  performed  Page, Meribeth Mattes 12/04/2011, 10:52 AM

## 2011-12-04 NOTE — Progress Notes (Signed)
Nutrition Follow-up  Diet Order:  CHO Modified, clear liquids  Pt diet has advanced to clear liquids.  Breathing improved.  Still struggling with headaches.  Pt c/o cramping pain in lower abdomen which is relieved with pressure.  Pt reports she does not like clear liquids and anticipating advancement to full liquids.  Pt reports appetite today- food looks and sounds appealing, however she is taking it slow. Not thrilled about clear liquids for dinner, but is able to find some choices that she might like.   Meds: Scheduled Meds:   . antiseptic oral rinse  15 mL Mouth Rinse q12n4p  . chlorhexidine  15 mL Mouth Rinse BID  . clonazePAM  0.5 mg Oral QHS  . cyclobenzaprine  5 mg Oral QHS  . ertapenem  1 g Intravenous Q24H  . gabapentin  800 mg Oral BID  . insulin aspart  0-20 Units Subcutaneous Q6H  . levothyroxine  25 mcg Oral QAC breakfast  . mometasone-formoterol  2 puff Inhalation BID  . pantoprazole  40 mg Oral Q1200  . vitamin A & D      . DISCONTD: naproxen  500 mg Oral BID WC  . DISCONTD: NON FORMULARY 2 puff  2 puff Inhalation BID  . DISCONTD: pantoprazole (PROTONIX) IV  40 mg Intravenous Q24H   Continuous Infusions:   . dextrose 5 % and 0.9 % NaCl with KCl 20 mEq/L 100 mL/hr at 12/03/11 0600   PRN Meds:.albuterol, ondansetron, ondansetron, promethazine, SUMAtriptan  Labs:  CMP     Component Value Date/Time   NA 140 12/01/2011 0352   K 3.7 12/01/2011 0352   CL 108 12/01/2011 0352   CO2 26 12/01/2011 0352   GLUCOSE 130* 12/01/2011 0352   BUN 4* 12/01/2011 0352   CREATININE 0.62 12/01/2011 0352   CALCIUM 8.2* 12/01/2011 0352   PROT 7.1 11/27/2011 1331   ALBUMIN 3.5 11/27/2011 1331   AST 14 11/27/2011 1331   ALT 8 11/27/2011 1331   ALKPHOS 62 11/27/2011 1331   BILITOT 0.5 11/27/2011 1331   GFRNONAA >90 12/01/2011 0352   GFRAA >90 12/01/2011 0352    Pt has been voiding Last BM 3/17   Weight Status:  Stable at 233 lbs  Nutrition Dx:  Inadequate oral intake,  ongoing  Intervention:   1.  Meals/snacks; obtained dinner order for this evening.  Discussed diet progression.  Pt hopeful for diet advancement to FULL LIQUIDS quickly.  Recommend Regular full liquids, then CHO Modified once diet advanced to solids.  Monitor:   1.  Food/Beverage; diet advanced as tolerated per MD.  Pt to improve intake.  Somewhat met.  Pt has advanced to clear liquids and is tolerating.  Few sips of meals.   Hoyt Koch Pager #:  [phone]

## 2011-12-05 ENCOUNTER — Other Ambulatory Visit: Payer: Self-pay

## 2011-12-05 ENCOUNTER — Encounter (HOSPITAL_COMMUNITY): Payer: Self-pay | Admitting: Family Medicine

## 2011-12-05 ENCOUNTER — Inpatient Hospital Stay (HOSPITAL_COMMUNITY): Payer: Medicare Other

## 2011-12-05 DIAGNOSIS — G4730 Sleep apnea, unspecified: Secondary | ICD-10-CM | POA: Diagnosis not present

## 2011-12-05 DIAGNOSIS — R0789 Other chest pain: Secondary | ICD-10-CM | POA: Diagnosis not present

## 2011-12-05 DIAGNOSIS — J9819 Other pulmonary collapse: Secondary | ICD-10-CM | POA: Diagnosis not present

## 2011-12-05 DIAGNOSIS — R918 Other nonspecific abnormal finding of lung field: Secondary | ICD-10-CM | POA: Diagnosis not present

## 2011-12-05 DIAGNOSIS — J45909 Unspecified asthma, uncomplicated: Secondary | ICD-10-CM | POA: Diagnosis not present

## 2011-12-05 DIAGNOSIS — K3580 Unspecified acute appendicitis: Secondary | ICD-10-CM | POA: Diagnosis not present

## 2011-12-05 DIAGNOSIS — R0602 Shortness of breath: Secondary | ICD-10-CM | POA: Diagnosis not present

## 2011-12-05 DIAGNOSIS — I2699 Other pulmonary embolism without acute cor pulmonale: Secondary | ICD-10-CM

## 2011-12-05 DIAGNOSIS — R079 Chest pain, unspecified: Secondary | ICD-10-CM | POA: Diagnosis not present

## 2011-12-05 DIAGNOSIS — R0902 Hypoxemia: Secondary | ICD-10-CM

## 2011-12-05 DIAGNOSIS — G4710 Hypersomnia, unspecified: Secondary | ICD-10-CM | POA: Diagnosis not present

## 2011-12-05 LAB — COMPREHENSIVE METABOLIC PANEL
ALT: 6 U/L (ref 0–35)
AST: 15 U/L (ref 0–37)
Albumin: 2.6 g/dL — ABNORMAL LOW (ref 3.5–5.2)
Alkaline Phosphatase: 52 U/L (ref 39–117)
BUN: <3 mg/dL — ABNORMAL LOW (ref 6–23)
CO2: 26 mEq/L (ref 19–32)
Calcium: 8.5 mg/dL (ref 8.4–10.5)
Chloride: 106 mEq/L (ref 96–112)
Creatinine, Ser: 0.49 mg/dL — ABNORMAL LOW (ref 0.50–1.10)
GFR calc Af Amer: >90 mL/min (ref >90)
GFR calc non Af Amer: >90 mL/min (ref >90)
Glucose, Bld: 116 mg/dL — ABNORMAL HIGH (ref 70–99)
Potassium: 3.5 mEq/L (ref 3.5–5.1)
Sodium: 142 mEq/L (ref 135–145)
Total Bilirubin: 0.2 mg/dL — ABNORMAL LOW (ref 0.3–1.2)
Total Protein: 6.7 g/dL (ref 6.0–8.3)

## 2011-12-05 LAB — CBC
HCT: 38.8 % (ref 36.0–46.0)
Hemoglobin: 12.1 g/dL (ref 12.0–15.0)
MCH: 27.8 pg (ref 26.0–34.0)
MCHC: 31.2 g/dL (ref 30.0–36.0)
MCV: 89.2 fL (ref 78.0–100.0)
Platelets: 418 10*3/uL — ABNORMAL HIGH (ref 150–400)
RBC: 4.35 MIL/uL (ref 3.87–5.11)
RDW: 14.5 % (ref 11.5–15.5)
WBC: 7 10*3/uL (ref 4.0–10.5)

## 2011-12-05 LAB — D-DIMER, QUANTITATIVE (NOT AT ARMC): D-Dimer, Quant: 9.93 ug/mL-FEU — ABNORMAL HIGH (ref 0.00–0.48)

## 2011-12-05 LAB — GLUCOSE, CAPILLARY
Glucose-Capillary: 107 mg/dL — ABNORMAL HIGH (ref 70–99)
Glucose-Capillary: 116 mg/dL — ABNORMAL HIGH (ref 70–99)
Glucose-Capillary: 96 mg/dL (ref 70–99)
Glucose-Capillary: 119 mg/dL — ABNORMAL HIGH (ref 70–99)

## 2011-12-05 LAB — CARDIAC PANEL(CRET KIN+CKTOT+MB+TROPI)
CK, MB: 1.8 ng/mL (ref 0.3–4.0)
Relative Index: INVALID (ref 0.0–2.5)
Total CK: 58 U/L (ref 7–177)
Troponin I: <0.3 ng/mL

## 2011-12-05 LAB — MAGNESIUM: Magnesium: 2 mg/dL (ref 1.5–2.5)

## 2011-12-05 MED ORDER — IPRATROPIUM BROMIDE 0.02 % IN SOLN
0.5000 mg | RESPIRATORY_TRACT | Status: DC
  Administered 2011-12-05 – 2011-12-07 (×7): 0.5 mg via RESPIRATORY_TRACT
  Filled 2011-12-05 (×7): qty 2.5

## 2011-12-05 MED ORDER — IPRATROPIUM BROMIDE 0.02 % IN SOLN
0.5000 mg | Freq: Four times a day (QID) | RESPIRATORY_TRACT | Status: DC | PRN
  Filled 2011-12-05 (×5): qty 2.5

## 2011-12-05 MED ORDER — ALTEPLASE 2 MG IJ SOLR
2.0000 mg | Freq: Once | INTRAMUSCULAR | Status: AC
  Administered 2011-12-05: 2 mg
  Filled 2011-12-05: qty 2

## 2011-12-05 MED ORDER — ALBUTEROL SULFATE (5 MG/ML) 0.5% IN NEBU
2.5000 mg | INHALATION_SOLUTION | RESPIRATORY_TRACT | Status: DC
  Administered 2011-12-05 – 2011-12-07 (×7): 2.5 mg via RESPIRATORY_TRACT
  Filled 2011-12-05 (×10): qty 0.5

## 2011-12-05 MED ORDER — OXYCODONE-ACETAMINOPHEN 10-325 MG PO TABS: 1.0000 | ORAL_TABLET | Freq: Two times a day (BID) | ORAL | Status: DC

## 2011-12-05 MED ORDER — DULOXETINE HCL 60 MG PO CPEP
60.0000 mg | ORAL_CAPSULE | Freq: Every day | ORAL | Status: DC
  Administered 2011-12-05 – 2011-12-11 (×7): 60 mg via ORAL
  Filled 2011-12-05 (×8): qty 1

## 2011-12-05 MED ORDER — LEVALBUTEROL HCL 0.63 MG/3ML IN NEBU
0.6300 mg | INHALATION_SOLUTION | Freq: Four times a day (QID) | RESPIRATORY_TRACT | Status: DC | PRN
  Filled 2011-12-05: qty 3

## 2011-12-05 MED ORDER — ALPRAZOLAM 0.25 MG PO TABS
0.2500 mg | ORAL_TABLET | Freq: Three times a day (TID) | ORAL | Status: DC | PRN
  Administered 2011-12-05 – 2011-12-11 (×8): 0.25 mg via ORAL
  Filled 2011-12-05 (×9): qty 1

## 2011-12-05 MED ORDER — OXYCODONE-ACETAMINOPHEN 5-325 MG PO TABS
1.0000 | ORAL_TABLET | Freq: Two times a day (BID) | ORAL | Status: DC
  Administered 2011-12-07 – 2011-12-11 (×7): 1 via ORAL
  Filled 2011-12-05 (×12): qty 1

## 2011-12-05 MED ORDER — OXYCODONE HCL 5 MG PO TABS
5.0000 mg | ORAL_TABLET | Freq: Two times a day (BID) | ORAL | Status: DC
  Administered 2011-12-06 – 2011-12-11 (×8): 5 mg via ORAL
  Filled 2011-12-05 (×11): qty 1

## 2011-12-05 NOTE — Consult Note (Addendum)
[facility] Consult Note  Hayley [name] CSN:[id number],MRN:[id number]   Patients out patient PCP is Juline Patch, MD, MD Consult requested in the Hospital by Md Ccs, MD, Will Marlyne Beards On 12/05/2011  Reason for consult: Evaluation and management recommendations for hypoxemia, chest pain and other medical problems.  With History of - Principal Problem:  *Appendicitis, acute, with peritonitis Active Problems:  DIABETES MELLITUS, TYPE II  MIGRAINE HEADACHE  IBS  DYSPNEA  Spasmodic dysphonia   Past Medical History  Diagnosis Date  . Hyperthyroidism   . Toxic goiter   . Reflux   . Colon polyps   . Osteoarthritis     in back  . Migraines   . Disorder of vocal cords     spasmotic dysphonia  . Obesity, Class III, BMI 40-49.9 (morbid obesity) 04/27/2010  . Pelvic pain    hypertension  Past Surgical History  Procedure Date  . Abdominal hysterectomy   . Zenkers diverticulum   . Kidney stone surgery     Past Surgical History  Procedure Date  . Abdominal hysterectomy   . Zenkers diverticulum   . Kidney stone surgery     HPI:-  Hayley [name] CSN:[id number],MRN:[id number] is a 66 y.o. female, who was admitted by the surgery service for an acute appendicitis procedure has been recovering in the hospital.  She began ambulating today and reported that after walking the halls she began to experience worsening wheezing and tightness in the chest area.  She denies having chest pain but reports that she's been having excessive wheezing.  She denies excessive shortness of breath.  She reports that she is normally treated for obstructive sleep apnea and has continued to use CPAP nightly.  She has a history of acid reflux, asthma, chronic pelvic pain, migraine headaches and had normally been taking Dulera medication twice daily for asthma.  The patient has received some orders for an EKG and cardiac studies.  Patient will be receiving nebulizer treatments as well.  The patient reports that she  had a cardiac catheterization at [facility] 2 years ago that was reportedly within normal limits with no coronary artery disease noted.  Given these new problems of hypoxia and wheezing with shortness of breath and other medical problems, a medical consultation was requested so that the patient can be followed during the hospitalization.  Review of Systems   Pertinent items are noted in HPI.  Otherwise a Comprehensive Review of Systems Was Completed and Reported As Negative.   Social History History  Substance Use Topics  . Smoking status: Never Smoker   . Smokeless tobacco: Never Used  . Alcohol Use: No    Family History History reviewed. No pertinent family history.   Prior to Admission medications   Medication Sig Start Date End Date Taking? Authorizing Provider  aspirin-sod bicarb-citric acid (ALKA-SELTZER) 325 MG TBEF Take 325 mg by mouth every 6 (six) hours as needed.   Yes Historical Provider, MD  clonazePAM (KLONOPIN) 0.5 MG tablet 0.5 mg daily as needed. For agitation or anxiety   Yes Historical Provider, MD  cyclobenzaprine (FLEXERIL) 10 MG tablet Take 10 mg by mouth at bedtime.   Yes Historical Provider, MD  diclofenac (FLECTOR) 1.3 % PTCH Place 1 patch onto the skin 2 (two) times daily. Apply to back   Yes Historical Provider, MD  DULoxetine (CYMBALTA) 60 MG capsule Take 60 mg by mouth daily.   Yes Historical Provider, MD  EPINEPHrine (EPIPEN) 0.3 mg/0.3 mL DEVI Inject 0.3 mg into the muscle once.  Yes Historical Provider, MD  gabapentin (NEURONTIN) 800 MG tablet Take 800 mg by mouth 2 (two) times daily.   Yes Historical Provider, MD  levothyroxine (SYNTHROID, LEVOTHROID) 25 MCG tablet Take 25 mcg by mouth daily.   Yes Historical Provider, MD  metoprolol succinate (TOPROL-XL) 50 MG 24 hr tablet Take 50 mg by mouth every other day.   Yes Historical Provider, MD  mometasone-formoterol (DULERA) 100-5 MCG/ACT AERO Inhale 2 puffs into the lungs 2 (two) times daily.   Yes  Historical Provider, MD  oxyCODONE-acetaminophen (PERCOCET) 10-325 MG per tablet Take 1 tablet by mouth 2 (two) times daily.   Yes Historical Provider, MD  promethazine (PHENERGAN) 25 MG tablet Take 25 mg by mouth every 8 (eight) hours as needed. For nausea   Yes Historical Provider, MD  SUMAtriptan (IMITREX) 6 MG/0.5ML SOLN injection Inject 6 mg into the skin every 2 (two) hours as needed.   Yes Historical Provider, MD    Allergies  Allergen Reactions  . Bee Anaphylaxis  . Etodolac Nausea And Vomiting  . Morphine Other (See Comments)    Hallucinations  . Propoxyphene N-Acetaminophen Nausea And Vomiting  . Penicillins Rash   Physical Exam  Intake/Output Summary (Last 24 hours) at 12/05/11 1828 Last data filed at 12/05/11 1731  Gross per 24 hour  Intake   2450 ml  Output    225 ml  Net   2225 ml   Blood pressure 106/69, pulse 107, temperature 98.6 F (37 C), temperature source Oral, resp. rate 16, height 5\' 6"  (1.676 m), weight 105.7 kg (233 lb 0.4 oz), SpO2 97.00%.  Head: Normocephalic, without obvious abnormality, atraumatic Eyes: negative, conjunctivae/corneas clear. PERRL, EOM's intact. Fundi benign. Nose: Nares normal. Septum midline. Mucosa normal. No drainage or sinus tenderness., no discharge Throat: lips, mucosa, and tongue normal; teeth and gums normal Neck: no adenopathy, no carotid bruit, no JVD, supple, symmetrical, trachea midline and thyroid not enlarged, symmetric, no tenderness/mass/nodules Lungs: Bilateral expiratory wheezes, faint Heart: S1, S2 normal Abdomen: soft, non-tender; bowel sounds normal; no masses,  no organomegaly Extremities: extremities normal, atraumatic, no cyanosis or edema Pulses: 2+ and symmetric Skin: Skin color, texture, turgor normal. No rashes or lesions Neurologic: Alert and oriented X 3, normal strength and tone. Normal symmetric reflexes. Normal coordination and gait  Data Review CBC w Diff: Lab Results  Component Value Date   WBC  6.7 12/01/2011   HGB 11.5* 12/01/2011   HCT 36.2 12/01/2011   PLT 277 12/01/2011   LYMPHOPCT 8* 11/27/2011   MONOPCT 8 11/27/2011   EOSPCT 1 11/27/2011   BASOPCT 0 11/27/2011    CMP: Lab Results  Component Value Date   NA 140 12/01/2011   K 3.7 12/01/2011   CL 108 12/01/2011   CO2 26 12/01/2011   BUN 4* 12/01/2011   CREATININE 0.62 12/01/2011   PROT 7.1 11/27/2011   ALBUMIN 3.5 11/27/2011   BILITOT 0.5 11/27/2011   ALKPHOS 62 11/27/2011   AST 14 11/27/2011   ALT 8 11/27/2011    Coagulation:  No results found for this basename: PROTIME, INR, PTT    Cardiac markers:  Pending No results found for this basename: CKMB, TROPONINI, MYOGLOBIN     Impression 1.  Asthma 2. OSA on CPAP 3. GERD 4. Atypical Chest Discomfort 5. Post op s/p appendectomy 6. Diabetes Mellitus, type 2 7. Anemia 8. History of hyperthyroidism  Comments and Recommendations Agree with getting cardiology workup with enzymes and EKGs.  We'll follow the results with you.  Recommend nebulizer treatments will schedule for DuoNeb treatments.  Check chest x-ray.  Check D-dimer,  Monitor electrolytes.  Monitor hemoglobin.  Treat acid reflux.  Did not order TSH as patient recently postop and hospitalized.  Please see orders.   Thank you for the consult, we will follow the patient with you in the Hospital.

## 2011-12-05 NOTE — Progress Notes (Signed)
8 Days Post-Op  Subjective: Afebrile VSs somewhat tachycardic  Day 6 Invanz, No labs, allot of concerns, +flatus, some loose stool incontinence,Tolerating clears well  Objective: Vital signs in last 24 hours: Temp:  [97.6 F (36.4 C)-98.8 F (37.1 C)] 98.6 F (37 C) (03/20 0900) Pulse Rate:  [96-107] 96  (03/20 0751) Resp:  [17-19] 17  (03/20 0900) BP: (115-129)/(71-81) 115/78 mmHg (03/20 0900) SpO2:  [83 %-94 %] 94 % (03/20 0900) Last BM Date: 12/03/11  Intake/Output from previous day: 03/19 0701 - 03/20 0700 In: 2250 [P.O.:1000; I.V.:1200; IV Piggyback:50] Out: -  Intake/Output this shift:    General appearance: alert, cooperative and no distress Resp: clear to auscultation bilaterally GI: soft, obese, still tender, + BS, +flatus, no BM, some incontience., loose watery stool  Lab Results:  No results found for this basename: WBC:2,HGB:2,HCT:2,PLT:2 in the last 72 hours  BMET No results found for this basename: NA:2,K:2,CL:2,CO2:2,GLUCOSE:2,BUN:2,CREATININE:2,CALCIUM:2 in the last 72 hours PT/INR No results found for this basename: LABPROT:2,INR:2 in the last 72 hours  No results found for this basename: AST:5,ALT:5,ALKPHOS:5,BILITOT:5,PROT:5,ALBUMIN:5 in the last 168 hours   Lipase     Component Value Date/Time   LIPASE 35 11/27/2011 1331     Studies/Results: No results found.  Medications:    . antiseptic oral rinse  15 mL Mouth Rinse q12n4p  . chlorhexidine  15 mL Mouth Rinse BID  . clonazePAM  0.5 mg Oral QHS  . cyclobenzaprine  5 mg Oral QHS  . ertapenem  1 g Intravenous Q24H  . gabapentin  800 mg Oral BID  . insulin aspart  0-20 Units Subcutaneous Q6H  . levothyroxine  25 mcg Oral QAC breakfast  . lidocaine   Other TID  . mometasone-formoterol  2 puff Inhalation BID  . pantoprazole  40 mg Oral Q1200  . saccharomyces boulardii  250 mg Oral BID    Assessment/Plan Acute appendicitis with phlegmon, probable perforated s/APPENDECTOMY LAPAROSCOPIC  11/27/11. DR. Michaell Cowing                       g 1) Post-op ileus: improving.  2) Pelvic pain: Etiology unclear ?neuropathy.  3) S/P appendectomy for acute appendicitis with peritonitis.  4) GERD: On Pantoprazole.  5) Diverticulosis.  6) Fatty liver.  7) Sleep apnea: on C-PAP.  Plan:  Go to full liquids, mobilize into halls, labs tomorrow, home when she can handle discomforts with PO pain meds, and tolerate full diet.  Called at 4:45 PM pt wheezing had an inhaler Rx, with no resolution, Hx of asthma on dulera at home Prn. When I ask her if she felt bad she said yes and indicated her neck to pubus.  She is having some expiratory wheezing.  Pretty anxious too.  Will try a nebulizer Rx.  Slow down Iv, no hx of cardiac dz.  Add PPI hx of Zenker's diverticulum. Add some prn xanax.      LOS: 8 days    Anes Rigel 12/05/2011

## 2011-12-05 NOTE — Progress Notes (Signed)
23:15 12/05/11--called abnormal D-dimer results; eardiac enzymes & CXR results to Dr Johna Sheriff (on call). No new orders at this time con't present plan DB &C; ambulate as much as possible. Will con't to monitor for problems.

## 2011-12-05 NOTE — Progress Notes (Signed)
Came back to see pt.  Nebulizer not started yet because she's ordering supper.  She is in no distress, this is not her asthma she says  She c/o chest tightness and someone sitting on her chest.  Telem shows sinus.  I will get an EKG and ask medicine to see.  She was in the ICU post op for hypoxia, she has OSA and cpap; seen by CCM.  Wheezing is better, she has some rales at the base, she does not appear to be in any distress.

## 2011-12-05 NOTE — Progress Notes (Signed)
CARE MANAGEMENT NOTE 12/05/2011  Patient:  Hayley [name], Hayley [name]   Account Number:  [id number]  Date Initiated:  12/05/2011  Documentation initiated by:  [facility]  Subjective/Objective Assessment:   dx acute appendicitis with peritonitis ; lap appendectomy     Action/Plan:   Cm spoke with patient 03/19 regarding discharge plans. Patient plans to go home upon discharge with spouse as caregiver. Cm will follow for needs   Anticipated DC Date:  12/08/2011   Anticipated DC Plan:  HOME/SELF CARE  In-house referral  NA      DC Planning Services  CM consult      Choice offered to / List presented to:             Status of service:  In process, will continue to follow Medicare Important Message given?   (If response is "NO", the following Medicare IM given date fields will be blank)

## 2011-12-06 ENCOUNTER — Encounter (HOSPITAL_COMMUNITY): Payer: Self-pay | Admitting: Radiology

## 2011-12-06 ENCOUNTER — Inpatient Hospital Stay (HOSPITAL_COMMUNITY): Payer: Medicare Other

## 2011-12-06 DIAGNOSIS — I10 Essential (primary) hypertension: Secondary | ICD-10-CM | POA: Diagnosis not present

## 2011-12-06 DIAGNOSIS — J9819 Other pulmonary collapse: Secondary | ICD-10-CM | POA: Diagnosis not present

## 2011-12-06 DIAGNOSIS — R0602 Shortness of breath: Secondary | ICD-10-CM

## 2011-12-06 DIAGNOSIS — I2699 Other pulmonary embolism without acute cor pulmonale: Secondary | ICD-10-CM | POA: Diagnosis not present

## 2011-12-06 DIAGNOSIS — G4710 Hypersomnia, unspecified: Secondary | ICD-10-CM | POA: Diagnosis not present

## 2011-12-06 DIAGNOSIS — J45909 Unspecified asthma, uncomplicated: Secondary | ICD-10-CM | POA: Diagnosis not present

## 2011-12-06 DIAGNOSIS — R0902 Hypoxemia: Secondary | ICD-10-CM | POA: Diagnosis not present

## 2011-12-06 DIAGNOSIS — I809 Phlebitis and thrombophlebitis of unspecified site: Secondary | ICD-10-CM | POA: Diagnosis not present

## 2011-12-06 LAB — COMPREHENSIVE METABOLIC PANEL
ALT: 6 U/L (ref 0–35)
AST: 12 U/L (ref 0–37)
Albumin: 2.4 g/dL — ABNORMAL LOW (ref 3.5–5.2)
Alkaline Phosphatase: 48 U/L (ref 39–117)
BUN: <3 mg/dL — ABNORMAL LOW (ref 6–23)
CO2: 29 mEq/L (ref 19–32)
Calcium: 8.3 mg/dL — ABNORMAL LOW (ref 8.4–10.5)
Chloride: 106 mEq/L (ref 96–112)
Creatinine, Ser: 0.53 mg/dL (ref 0.50–1.10)
GFR calc Af Amer: >90 mL/min (ref >90)
GFR calc non Af Amer: >90 mL/min (ref >90)
Glucose, Bld: 124 mg/dL — ABNORMAL HIGH (ref 70–99)
Potassium: 3.3 mEq/L — ABNORMAL LOW (ref 3.5–5.1)
Sodium: 142 mEq/L (ref 135–145)
Total Bilirubin: 0.2 mg/dL — ABNORMAL LOW (ref 0.3–1.2)
Total Protein: 6 g/dL (ref 6.0–8.3)

## 2011-12-06 LAB — CARDIAC PANEL(CRET KIN+CKTOT+MB+TROPI)
CK, MB: 1.7 ng/mL (ref 0.3–4.0)
CK, MB: 1.8 ng/mL (ref 0.3–4.0)
Relative Index: INVALID (ref 0.0–2.5)
Relative Index: INVALID (ref 0.0–2.5)
Total CK: 45 U/L (ref 7–177)
Total CK: 52 U/L (ref 7–177)
Troponin I: <0.3 ng/mL (ref <0.30)
Troponin I: <0.3 ng/mL (ref <0.30)

## 2011-12-06 LAB — GLUCOSE, CAPILLARY
Glucose-Capillary: 112 mg/dL — ABNORMAL HIGH (ref 70–99)
Glucose-Capillary: 119 mg/dL — ABNORMAL HIGH (ref 70–99)
Glucose-Capillary: 95 mg/dL (ref 70–99)
Glucose-Capillary: 98 mg/dL (ref 70–99)

## 2011-12-06 LAB — PROTIME-INR
INR: 1.12 (ref 0.00–1.49)
Prothrombin Time: 14.6 seconds (ref 11.6–15.2)

## 2011-12-06 LAB — PRO B NATRIURETIC PEPTIDE: Pro B Natriuretic peptide (BNP): 186.2 pg/mL — ABNORMAL HIGH (ref 0–125)

## 2011-12-06 LAB — CBC
HCT: 34.2 % — ABNORMAL LOW (ref 36.0–46.0)
Hemoglobin: 10.6 g/dL — ABNORMAL LOW (ref 12.0–15.0)
MCH: 27.7 pg (ref 26.0–34.0)
MCHC: 31 g/dL (ref 30.0–36.0)
MCV: 89.3 fL (ref 78.0–100.0)
Platelets: 396 10*3/uL (ref 150–400)
RBC: 3.83 MIL/uL — ABNORMAL LOW (ref 3.87–5.11)
RDW: 14.6 % (ref 11.5–15.5)
WBC: 6 10*3/uL (ref 4.0–10.5)

## 2011-12-06 LAB — APTT: aPTT: 37 seconds (ref 24–37)

## 2011-12-06 MED ORDER — POTASSIUM CHLORIDE CRYS ER 20 MEQ PO TBCR
20.0000 meq | EXTENDED_RELEASE_TABLET | Freq: Three times a day (TID) | ORAL | Status: AC
  Administered 2011-12-06 – 2011-12-07 (×6): 20 meq via ORAL
  Filled 2011-12-06 (×6): qty 1

## 2011-12-06 MED ORDER — DEXTROSE 5 % IV SOLN
2.0000 g | Freq: Three times a day (TID) | INTRAVENOUS | Status: DC
  Administered 2011-12-06: 2 g via INTRAVENOUS
  Filled 2011-12-06 (×3): qty 2

## 2011-12-06 MED ORDER — HEPARIN BOLUS VIA INFUSION
4000.0000 [IU] | Freq: Once | INTRAVENOUS | Status: AC
  Administered 2011-12-06: 4000 [IU] via INTRAVENOUS
  Filled 2011-12-06: qty 4000

## 2011-12-06 MED ORDER — HEPARIN (PORCINE) IN NACL 100-0.45 UNIT/ML-% IJ SOLN
1600.0000 [IU]/h | INTRAMUSCULAR | Status: AC
  Administered 2011-12-06: 1400 [IU]/h via INTRAVENOUS
  Administered 2011-12-07 – 2011-12-08 (×3): 2000 [IU]/h via INTRAVENOUS
  Administered 2011-12-09 – 2011-12-10 (×2): 1600 [IU]/h via INTRAVENOUS
  Filled 2011-12-06 (×15): qty 250

## 2011-12-06 MED ORDER — VANCOMYCIN HCL IN DEXTROSE 1-5 GM/200ML-% IV SOLN
1000.0000 mg | Freq: Two times a day (BID) | INTRAVENOUS | Status: DC
  Administered 2011-12-06: 1000 mg via INTRAVENOUS
  Filled 2011-12-06 (×2): qty 200

## 2011-12-06 MED ORDER — IOHEXOL 300 MG/ML  SOLN
100.0000 mL | Freq: Once | INTRAMUSCULAR | Status: AC | PRN
  Administered 2011-12-06: 100 mL via INTRAVENOUS

## 2011-12-06 MED ORDER — LEVOFLOXACIN IN D5W 750 MG/150ML IV SOLN
750.0000 mg | INTRAVENOUS | Status: DC
  Administered 2011-12-06: 750 mg via INTRAVENOUS
  Filled 2011-12-06: qty 150

## 2011-12-06 MED ORDER — BENZONATATE 100 MG PO CAPS
100.0000 mg | ORAL_CAPSULE | Freq: Three times a day (TID) | ORAL | Status: DC | PRN
  Administered 2011-12-06 – 2011-12-08 (×4): 100 mg via ORAL
  Filled 2011-12-06 (×4): qty 1

## 2011-12-06 NOTE — Progress Notes (Signed)
Bilateral lower extremity venous duplex completed.  Preliminary report is negative for DVT, SVT, or a Baker's cyst. 

## 2011-12-06 NOTE — Progress Notes (Signed)
ANTICOAGULATION CONSULT NOTE - Initial Consult  Pharmacy Consult for Heparin Indication: pulmonary embolus  Allergies  Allergen Reactions  . Bee Anaphylaxis  . Etodolac Nausea And Vomiting  . Morphine Other (See Comments)    Hallucinations  . Propoxyphene N-Acetaminophen Nausea And Vomiting  . Penicillins Rash    Patient Measurements: Height: 5\' 6"  (167.6 cm) Weight: 233 lb 0.4 oz (105.7 kg) IBW/kg (Calculated) : 59.3  Heparin Dosing Weight: 83.5kg  Vital Signs: Temp: 98.4 F (36.9 C) (03/21 1320) Temp src: Oral (03/21 1320) BP: 125/86 mmHg (03/21 1320) Pulse Rate: 90  (03/21 1320)  Labs:  Basename 12/06/11 0505 12/05/11 2345 12/05/11 1920  HGB 10.6* -- 12.1  HCT 34.2* -- 38.8  PLT 396 -- 418*  APTT -- -- --  LABPROT -- -- --  INR -- -- --  HEPARINUNFRC -- -- --  CREATININE 0.53 -- 0.49*  CKTOTAL 45 52 58  CKMB 1.7 1.8 1.8  TROPONINI <0.30 <0.30 <0.30   Estimated Creatinine Clearance: 85.1 ml/min (by C-G formula based on Cr of 0.53).  Medical History: Past Medical History  Diagnosis Date  . Hyperthyroidism   . Toxic goiter   . Reflux   . Colon polyps   . Osteoarthritis     in back  . Migraines   . Disorder of vocal cords     spasmotic dysphonia  . Obesity, Class III, BMI 40-49.9 (morbid obesity) 04/27/2010  . Pelvic pain     Medications:  Scheduled:    . ipratropium  0.5 mg Nebulization Q4H   And  . albuterol  2.5 mg Nebulization Q4H  . alteplase  2 mg Intracatheter Once  . antiseptic oral rinse  15 mL Mouth Rinse q12n4p  . aztreonam  2 g Intravenous Q8H  . chlorhexidine  15 mL Mouth Rinse BID  . clonazePAM  0.5 mg Oral QHS  . cyclobenzaprine  5 mg Oral QHS  . DULoxetine  60 mg Oral Daily  . ertapenem  1 g Intravenous Q24H  . gabapentin  800 mg Oral BID  . insulin aspart  0-20 Units Subcutaneous Q6H  . levofloxacin (LEVAQUIN) IV  750 mg Intravenous Q24H  . levothyroxine  25 mcg Oral QAC breakfast  . lidocaine   Other TID  .  mometasone-formoterol  2 puff Inhalation BID  . oxyCODONE-acetaminophen  1 tablet Oral BID   And  . oxyCODONE  5 mg Oral BID  . pantoprazole  40 mg Oral Q1200  . potassium chloride  20 mEq Oral TID  . saccharomyces boulardii  250 mg Oral BID  . vancomycin  1,000 mg Intravenous Q12H   Infusions:    . DISCONTD: dextrose 5 % and 0.9 % NaCl with KCl 20 mEq/L 75 mL/hr at 12/05/11 2300   PRN: albuterol, ALPRAZolam, iohexol, ipratropium, levalbuterol, ondansetron, ondansetron, promethazine, sodium chloride, SUMAtriptan  Assessment: [name] admitted 3/12 with acute appendicitis s/p lap appendectomy on 3/12, now found to have "Bilateral segmental/subsegmental pulmonary emboli. Overall clot  burden is small to moderate" on CT. To start IV heparin with bolus per Pharmacy for PE. Per surgery note, plan is to start warfarin tomorrow. Will bolus 50 units/kg and start infusion ~ 16 units/kg/hr. (Heparin Dosing Weight: 83.5kg). No recent warfarin use listed on Med Rec.  Goal of Therapy:  Heparin level 0.3-0.7 units/ml   Plan:  1) Heparin bolus 4,000 units IV x1 2) Heparin 1400 units/hr (14 ml/hr) 3) Heparin level 6 hours after heparin started. 4) Daily heparin level, CBC  Annia Belt 12/06/2011,2:10 PM

## 2011-12-06 NOTE — Progress Notes (Signed)
Left AC vein identified using US-vein accessed x1 attempt VSandritter RN/VABC

## 2011-12-06 NOTE — Progress Notes (Signed)
PT Cancellation Note  Treatment cancelled today due to medical issues with patient which prohibited therapy.  Per RN, pt has pulmonary embolism and will be transferring to ICU today.  Will check back with pt when medically stable.   Thanks,  Page, Meribeth Mattes 12/06/2011, 2:18 PM Pager #: [phone]

## 2011-12-06 NOTE — Progress Notes (Signed)
Patient ID: Hayley [name], female   DOB: [birth year], 66 y.o.   MRN: [id number]  Study Result     *RADIOLOGY REPORT*  Clinical Data: SOB, elevated D-dimer, recent appendectomy, evaluate  for PE  CT ANGIOGRAPHY CHEST  Technique: Multidetector CT imaging of the chest using the  standard protocol during bolus administration of intravenous  contrast. Multiplanar reconstructed images including MIPs were  obtained and reviewed to evaluate the vascular anatomy.  Contrast: 100 ml Omnipaque-300 IV  Comparison: Chest radiographs dated 12/05/2011.  Findings: Nonocclusive filling defects within  segmental/subsegmental pulmonary arteries in all lobes, most  prominent in the right upper lobe (series 7/image 107). Overall  clot burden is small to moderate.  Dependent atelectasis in the bilateral lower lobes. No  superimposed opacities suspicious for pneumonia.  Visualized thyroid is mildly heterogeneous.  The heart is normal in size. No pericardial effusion.  No suspicious mediastinal, hilar, or axillary lymphadenopathy.  Visualized upper abdomen is notable for hepatic steatosis.  Bilateral breast augmentation.  Mild degenerative changes of the visualized thoracolumbar spine.  IMPRESSION:  Bilateral segmental/subsegmental pulmonary emboli. Overall clot  burden is small to moderate.    Pt stable but definitely worsening WOB over the past 24 hours. Will transfer to SDU to monitor more closely over next day or so.  Pharmacy consulted for Heparin gtt and will plan to begin warfarin tomorrow.  Will also get V Doppler's of lower extremity  Kamyra Schroeck,PA-C Pager [phone] General Trauma Pager [phone]

## 2011-12-06 NOTE — Progress Notes (Signed)
Pt placed on her home cpap machine at this time with 2L O2 bled in.  Jacqulynn Cadet RRT

## 2011-12-06 NOTE — Progress Notes (Signed)
Subjective: Patient with improved shortness of breath.  Objective: Weight change:   Intake/Output Summary (Last 24 hours) at 12/06/11 1821 Last data filed at 12/06/11 1700  Gross per 24 hour  Intake 3326.92 ml  Output   1501 ml  Net 1825.92 ml    Filed Vitals:   12/06/11 1700  BP: 124/72  Pulse: 97  Temp:   Resp: 18   General: Patient appears her stated age. No increased work of breathing. Cardiovascular: Regular rate rhythm. Lungs: Decreased breath sounds at the bases. Abdomen: Positive bowel sounds. Extremities: Right upper extremity with some erythema and some palpable cords and swelling.  Lab Results: Reviewed  Micro Results: Recent Results (from the past 240 hour(s))  MRSA PCR SCREENING     Status: Normal   Collection Time   11/28/11  2:53 AM      Component Value Range Status Comment   MRSA by PCR NEGATIVE  NEGATIVE  Final     Studies/Results: Dg Chest 2 View  12/05/2011  *RADIOLOGY REPORT*  Clinical Data: Wheezing.  Cough.  Chest discomfort.  CHEST - 2 VIEW  Comparison: 11/27/2011  Findings: There is worsening of atelectasis and mild infiltrate in both lower lungs.  The upper lobes are clear.  No effusions.  No significant bony finding.  IMPRESSION: Worsening of atelectasis and patchy infiltrate at both lung bases.  Original Report Authenticated By: Thomasenia Sales, M.D.   Dg Chest 2 View  11/27/2011  *RADIOLOGY REPORT*  Clinical Data: Nausea, vomiting, cough  CHEST - 2 VIEW  Comparison: 12/18/2006  Findings: Borderline cardiomegaly but normal vascularity.  Negative for edema, CHF, collapse, consolidation, pneumonia, effusion or pneumothorax.  Trachea midline.  Degenerative changes of the spine. Lateral view is limited with motion artifact.  Cholecystectomy noted.  IMPRESSION: Borderline cardiomegaly without CHF or pneumonia.  Stable exam  Original Report Authenticated By: Judie Petit. Ruel Favors, M.D.   Ct Angio Chest W/cm &/or Wo Cm  12/06/2011  *RADIOLOGY REPORT*   Clinical Data: SOB, elevated D-dimer, recent appendectomy, evaluate for PE  CT ANGIOGRAPHY CHEST  Technique:  Multidetector CT imaging of the chest using the standard protocol during bolus administration of intravenous contrast. Multiplanar reconstructed images including MIPs were obtained and reviewed to evaluate the vascular anatomy.  Contrast:  100 ml Omnipaque-300 IV  Comparison: Chest radiographs dated 12/05/2011.  Findings: Nonocclusive filling defects within segmental/subsegmental pulmonary arteries in all lobes, most prominent in the right upper lobe (series 7/image 107).  Overall clot burden is small to moderate.  Dependent atelectasis in the bilateral lower lobes.  No superimposed opacities suspicious for pneumonia.  Visualized thyroid is mildly heterogeneous.  The heart is normal in size.  No pericardial effusion.  No suspicious mediastinal, hilar, or axillary lymphadenopathy.  Visualized upper abdomen is notable for hepatic steatosis.  Bilateral breast augmentation.  Mild degenerative changes of the visualized thoracolumbar spine.  IMPRESSION: Bilateral segmental/subsegmental pulmonary emboli.  Overall clot burden is small to moderate.  Critical Value/emergent results were called by telephone at the time of interpretation on 12/06/2011  at 1258 hours  to  Elnita Maxwell, the RN caring for the patient, who verbally acknowledged these results.  Original Report Authenticated By: Charline Bills, M.D.   Ct Abdomen Pelvis W Contrast  11/27/2011  *RADIOLOGY REPORT*  Clinical Data: Right lower quadrant pain with nausea and vomiting.  CT ABDOMEN AND PELVIS WITH CONTRAST  Technique:  Multidetector CT imaging of the abdomen and pelvis was performed following the standard protocol during bolus administration of  intravenous contrast.  Contrast: OMNIPAQUE IOHEXOL 300 MG/ML IJ SOLN  Comparison: 08/15/2010  Findings: There is evidence of acute appendicitis.  Focal calcified appendicolith is present near the  proximal origin of the appendix. The distal appendix is severely edematous measuring approximately 18 mm in greatest diameter.  The appendix ascends posterior to the cecum.  The colon is very redundant in the right abdomen.  Severe inflammatory changes are seen around the appendix with adjacent fluid present.  No focal abscess is currently present.  Based on CT appearance, the appendix may be perforated.  Other bowel loops are unremarkable.  There is significant atelectasis at both lung bases.  The gallbladder is been removed. The liver, pancreas, spleen, adrenal glands and kidneys are unremarkable.  No hernias are identified.  The bladder is within normal limits.  Bony structures show degenerative changes of the spine.  IMPRESSION: Acute appendicitis with calcified proximal appendicolith and severely inflamed appendix present which may be a ruptured based on CT appearance.  No focal abscess is identified.  Original Report Authenticated By: Reola Calkins, M.D.   Dg Abd 2 Views  12/02/2011  *RADIOLOGY REPORT*  Clinical Data: Abdominal pain.  Recent appendicitis.  Dilated small bowel.  ABDOMEN - 2 VIEW  Comparison: Radiographs dated 12/01/2011  Findings: An NG tube has been inserted and the tip is in the distal stomach.  There are persistent dilated loops of small bowel in the mid abdomen.  The colon is not distended.  No free air.  IMPRESSION: Persistent distention of small bowel.  Stomach is decompressed with a NG tube in place.  Original Report Authenticated By: Gwynn Burly, M.D.   Dg Abd 2 Views  12/01/2011  *RADIOLOGY REPORT*  Clinical Data: Abdominal pain and distention.  Recent appendectomy.  ABDOMEN - 2 VIEW  Comparison: CT scan dated 11/27/2011  Findings: There is fairly extensive air in the bowel with multiple dilated loops of small bowel.  The colon is not distended.  The patient has a fairly severe lumbar rotoscoliosis.  No free air.  IMPRESSION: Dilated small bowel loops which could  represent a focal ileus or partial small bowel obstruction.  Does the patient have hyperactive bowel sounds?  Original Report Authenticated By: Gwynn Burly, M.D.   Medications: Scheduled Meds:   . ipratropium  0.5 mg Nebulization Q4H   And  . albuterol  2.5 mg Nebulization Q4H  . alteplase  2 mg Intracatheter Once  . antiseptic oral rinse  15 mL Mouth Rinse q12n4p  . aztreonam  2 g Intravenous Q8H  . chlorhexidine  15 mL Mouth Rinse BID  . clonazePAM  0.5 mg Oral QHS  . cyclobenzaprine  5 mg Oral QHS  . DULoxetine  60 mg Oral Daily  . ertapenem  1 g Intravenous Q24H  . gabapentin  800 mg Oral BID  . heparin  4,000 Units Intravenous Once  . insulin aspart  0-20 Units Subcutaneous Q6H  . levofloxacin (LEVAQUIN) IV  750 mg Intravenous Q24H  . levothyroxine  25 mcg Oral QAC breakfast  . lidocaine   Other TID  . mometasone-formoterol  2 puff Inhalation BID  . oxyCODONE-acetaminophen  1 tablet Oral BID   And  . oxyCODONE  5 mg Oral BID  . pantoprazole  40 mg Oral Q1200  . potassium chloride  20 mEq Oral TID  . saccharomyces boulardii  250 mg Oral BID  . vancomycin  1,000 mg Intravenous Q12H   Continuous Infusions:   . heparin  1,400 Units/hr (12/06/11 1618)  . DISCONTD: dextrose 5 % and 0.9 % NaCl with KCl 20 mEq/L 75 mL/hr at 12/05/11 2300   PRN Meds:.albuterol, ALPRAZolam, iohexol, ipratropium, levalbuterol, ondansetron, ondansetron, promethazine, sodium chloride, SUMAtriptan  Assessment/Plan: Appendicitis, acute, with peritonitis (11/27/2011) Status post appendectomy. 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 Chest x-ray was question of pneumonia. CT scan did not show any pneumonia or lymphadenopathy. Will discontinue empiric  antibiotic. DC bed rest. Lower extremity Dopplers negative.    Thrombophlebitis (12/06/2011)  warm compresses when necessary. Will get Doppler ultrasound of the right upper extremity.   LOS: 9 days   Carollee Massed [phone] 12/06/2011, 6:21 PM

## 2011-12-06 NOTE — Progress Notes (Signed)
12/06/11-IVT capped off IV site due to redness upper the arm & pt's c/o soreness in the right upper arm area. IVT is still unable to draw blood via the midline port even after TPA last evening attempts unsuccessful as well. Midline will probably have to be discontinued & suggest culture of tip as well. IVT to relay problem to day shift IVT, & need MD to evaluate as well-thxs!

## 2011-12-06 NOTE — Progress Notes (Signed)
Patient ID: Hayley [name], female   DOB: 1945-10-05, 65 y.o.   MRN: [id number] 9 Days Post-Op  Subjective: Still feeling like her breathing has not returned to baseline following a walk yesterday. She reports that she abruptly developed chest tightness following the walk. IM consulted and work up thus far non diagnostic. The pt also developed warmth and edema of the right upper extremity where she had a midline IV in (this line has been removed) but I am concerned that she may have had a PE, so will order Chest CT to rule out PE.  She is tolerating liquids well.  Objective: Vital signs in last 24 hours: Temp:  [98.6 F (37 C)-98.8 F (37.1 C)] 98.6 F (37 C) (03/21 0524) Pulse Rate:  [91-107] 91  (03/21 0524) Resp:  [16-18] 16  (03/21 0524) BP: (101-126)/(66-79) 101/66 mmHg (03/21 0524) SpO2:  [88 %-97 %] 88 % (03/21 0916) Last BM Date: 12/04/11  Intake/Output from previous day: 03/20 0701 - 03/21 0700 In: 3672.9 [P.O.:1920; I.V.:1752.9] Out: 1025 [Urine:1025] Intake/Output this shift:    General appearance: alert, cooperative and moderate distress, very SOB with talking Resp: moving air bilaterally with some expiratory wheesing GI: soft, obese, still tender, + BS  Lab Results:   [facility] 12/06/11 0505 12/05/11 1920  WBC 6.0 7.0  HGB 10.6* 12.1  HCT 34.2* 38.8  PLT 396 418*    BMET  Basename 12/06/11 0505 12/05/11 1920  NA 142 142  K 3.3* 3.5  CL 106 106  CO2 29 26  GLUCOSE 124* 116*  BUN <3* <3*  CREATININE 0.53 0.49*  CALCIUM 8.3* 8.5   PT/INR No results found for this basename: LABPROT:2,INR:2 in the last 72 hours   Lab 12/06/11 0505 12/05/11 1920  AST 12 15  ALT 6 6  ALKPHOS 48 52  BILITOT 0.2* 0.2*  PROT 6.0 6.7  ALBUMIN 2.4* 2.6*     Lipase     Component Value Date/Time   LIPASE 35 11/27/2011 1331     Studies/Results: Dg Chest 2 View  12/05/2011  *RADIOLOGY REPORT*  Clinical Data: Wheezing.  Cough.  Chest discomfort.  CHEST - 2 VIEW  Comparison:  11/27/2011  Findings: There is worsening of atelectasis and mild infiltrate in both lower lungs.  The upper lobes are clear.  No effusions.  No significant bony finding.  IMPRESSION: Worsening of atelectasis and patchy infiltrate at both lung bases.  Original Report Authenticated By: Thomasenia Sales, M.D.    Medications:    . ipratropium  0.5 mg Nebulization Q4H   And  . albuterol  2.5 mg Nebulization Q4H  . alteplase  2 mg Intracatheter Once  . antiseptic oral rinse  15 mL Mouth Rinse q12n4p  . chlorhexidine  15 mL Mouth Rinse BID  . clonazePAM  0.5 mg Oral QHS  . cyclobenzaprine  5 mg Oral QHS  . DULoxetine  60 mg Oral Daily  . ertapenem  1 g Intravenous Q24H  . gabapentin  800 mg Oral BID  . insulin aspart  0-20 Units Subcutaneous Q6H  . levothyroxine  25 mcg Oral QAC breakfast  . lidocaine   Other TID  . mometasone-formoterol  2 puff Inhalation BID  . oxyCODONE-acetaminophen  1 tablet Oral BID   And  . oxyCODONE  5 mg Oral BID  . pantoprazole  40 mg Oral Q1200  . saccharomyces boulardii  250 mg Oral BID  . DISCONTD: oxyCODONE-acetaminophen  1 tablet Oral BID    Assessment/Plan Acute  appendicitis with phlegmon, probable perforated s/APPENDECTOMY LAPAROSCOPIC 11/27/11. DR. Michaell Cowing                       1) Post-op ileus: improving.  2) Pelvic pain: Etiology unclear ?neuropathy.  3) S/P appendectomy for acute appendicitis with peritonitis.  4) GERD: On Pantoprazole.  5) Diverticulosis.  6) Fatty liver.  7) Sleep apnea: on C-PAP.  Plan:  Chest CT to rule out PE  IV therapy in now to establish new IV access.   From a surgery standpoint she seems to be doing well. And if CT is negative, could advance diet further. Will give K+ supplement po and follow labs up in am.     LOS: 9 days    Darey Hershberger,PA-C Pager [phone] General Trauma Pager [phone]

## 2011-12-06 NOTE — Plan of Care (Signed)
Problem: Phase II Progression Outcomes Goal: Progressing with IS, TCDB Outcome: Completed/Met Date Met:  12/06/11 Refused to use IS--claims it causes migranes  Problem: Phase III Progression Outcomes Goal: Nasogastric tube discontinued Outcome: Completed/Met Date Met:  12/06/11 D/C'd 3/19

## 2011-12-06 NOTE — Progress Notes (Signed)
Asked to assess Midline catheter - site reddened, edema, tender, warm. Patient states pressure when flushed but no stinging or burning at this time. RN to inform MD. VSandritter RN/VABC

## 2011-12-06 NOTE — Progress Notes (Signed)
ANTIBIOTIC CONSULT NOTE - INITIAL  Pharmacy Consult for Vanco, Aztreonam, Levaquin Indication: Suspected PNA  Allergies  Allergen Reactions  . Bee Anaphylaxis  . Etodolac Nausea And Vomiting  . Morphine Other (See Comments)    Hallucinations  . Propoxyphene N-Acetaminophen Nausea And Vomiting  . Penicillins Rash    Patient Measurements: Height: 5\' 6"  (167.6 cm) Weight: 233 lb 0.4 oz (105.7 kg) IBW/kg (Calculated) : 59.3   Vital Signs: Temp: 98.6 F (37 C) (03/21 0524) Temp src: Oral (03/21 0524) BP: 101/66 mmHg (03/21 0524) Pulse Rate: 91  (03/21 0524) Intake/Output from previous day: 03/20 0701 - 03/21 0700 In: 3672.9 [P.O.:1920; I.V.:1752.9] Out: 1025 [Urine:1025] Intake/Output from this shift:    Labs:  [facility] 12/06/11 0505 12/05/11 1920  WBC 6.0 7.0  HGB 10.6* 12.1  PLT 396 418*  LABCREA -- --  CREATININE 0.53 0.49*   Estimated Creatinine Clearance: 85.1 ml/min (by C-G formula based on Cr of 0.53). No results found for this basename: VANCOTROUGH:2,VANCOPEAK:2,VANCORANDOM:2,GENTTROUGH:2,GENTPEAK:2,GENTRANDOM:2,TOBRATROUGH:2,TOBRAPEAK:2,TOBRARND:2,AMIKACINPEAK:2,AMIKACINTROU:2,AMIKACIN:2, in the last 72 hours   Microbiology: Recent Results (from the past 720 hour(s))  MRSA PCR SCREENING     Status: Normal   Collection Time   11/28/11  2:53 AM      Component Value Range Status Comment   MRSA by PCR NEGATIVE  NEGATIVE  Final     Medical History: Past Medical History  Diagnosis Date  . Hyperthyroidism   . Toxic goiter   . Reflux   . Colon polyps   . Osteoarthritis     in back  . Migraines   . Disorder of vocal cords     spasmotic dysphonia  . Obesity, Class III, BMI 40-49.9 (morbid obesity) 04/27/2010  . Pelvic pain     Medications:  Scheduled:    . ipratropium  0.5 mg Nebulization Q4H   And  . albuterol  2.5 mg Nebulization Q4H  . alteplase  2 mg Intracatheter Once  . antiseptic oral rinse  15 mL Mouth Rinse q12n4p  . chlorhexidine  15 mL  Mouth Rinse BID  . clonazePAM  0.5 mg Oral QHS  . cyclobenzaprine  5 mg Oral QHS  . DULoxetine  60 mg Oral Daily  . ertapenem  1 g Intravenous Q24H  . gabapentin  800 mg Oral BID  . insulin aspart  0-20 Units Subcutaneous Q6H  . levothyroxine  25 mcg Oral QAC breakfast  . lidocaine   Other TID  . mometasone-formoterol  2 puff Inhalation BID  . oxyCODONE-acetaminophen  1 tablet Oral BID   And  . oxyCODONE  5 mg Oral BID  . pantoprazole  40 mg Oral Q1200  . saccharomyces boulardii  250 mg Oral BID  . DISCONTD: oxyCODONE-acetaminophen  1 tablet Oral BID   Infusions:    . DISCONTD: dextrose 5 % and 0.9 % NaCl with KCl 20 mEq/L 75 mL/hr at 12/05/11 2300   PRN: albuterol, ALPRAZolam, ipratropium, levalbuterol, ondansetron, ondansetron, promethazine, sodium chloride, SUMAtriptan Anti-infectives     Start     Dose/Rate Route Frequency Ordered Stop   11/28/11 2130   ertapenem (INVANZ) 1 g in sodium chloride 0.9 % 50 mL IVPB        1 g 100 mL/hr over 30 Minutes Intravenous Every 24 hours 11/28/11 0201 12/05/11 2129   11/27/11 2130   ertapenem (INVANZ) 1 g in sodium chloride 0.9 % 50 mL IVPB  Status:  Discontinued        1 g 100 mL/hr over 30 Minutes Intravenous Every  24 hours 11/27/11 2104 11/28/11 0201         Assessment: [name] admitted 3/12 with acute appendicitis s/p lap appendectomy on 3/12 who has received 6 doses of ertapenem.  Now to start Vanco, aztreonam, and levaquin for suspected PNA with a PCN allergy (discussed with Dr. Earlene Plater).  Goal of Therapy:  Vancomycin trough level 15-20 mcg/ml  Plan:  1) Aztreonam 2g IV q8h 2) Levaquin 750mg  IV q24h 3) Vanco 1g IV q12h  Annia Belt 12/06/2011,10:41 AM

## 2011-12-06 NOTE — Progress Notes (Signed)
Notified by RN that heparin has not started yet due to IV access issues.  Will reschedule heparin level for 6hr after start of infusion.  Loralee Pacas, PharmD, BCPS 12/06/2011, 4:35 PM

## 2011-12-07 ENCOUNTER — Encounter (HOSPITAL_COMMUNITY): Payer: Self-pay

## 2011-12-07 DIAGNOSIS — I2699 Other pulmonary embolism without acute cor pulmonale: Secondary | ICD-10-CM | POA: Diagnosis not present

## 2011-12-07 DIAGNOSIS — R0902 Hypoxemia: Secondary | ICD-10-CM | POA: Diagnosis not present

## 2011-12-07 DIAGNOSIS — M7989 Other specified soft tissue disorders: Secondary | ICD-10-CM

## 2011-12-07 DIAGNOSIS — M79609 Pain in unspecified limb: Secondary | ICD-10-CM

## 2011-12-07 LAB — GLUCOSE, CAPILLARY
Glucose-Capillary: 105 mg/dL — ABNORMAL HIGH (ref 70–99)
Glucose-Capillary: 99 mg/dL (ref 70–99)
Glucose-Capillary: 137 mg/dL — ABNORMAL HIGH (ref 70–99)

## 2011-12-07 LAB — BASIC METABOLIC PANEL
BUN: <3 mg/dL — ABNORMAL LOW (ref 6–23)
CO2: 33 mEq/L — ABNORMAL HIGH (ref 19–32)
Calcium: 8.6 mg/dL (ref 8.4–10.5)
Chloride: 102 mEq/L (ref 96–112)
Creatinine, Ser: 0.57 mg/dL (ref 0.50–1.10)
GFR calc Af Amer: >90 mL/min (ref >90)
GFR calc non Af Amer: >90 mL/min (ref >90)
Glucose, Bld: 103 mg/dL — ABNORMAL HIGH (ref 70–99)
Potassium: 4 mEq/L (ref 3.5–5.1)
Sodium: 138 mEq/L (ref 135–145)

## 2011-12-07 LAB — HEPARIN LEVEL (UNFRACTIONATED)
Heparin Unfractionated: 0.2 IU/mL — ABNORMAL LOW (ref 0.30–0.70)
Heparin Unfractionated: 0.24 IU/mL — ABNORMAL LOW (ref 0.30–0.70)
Heparin Unfractionated: 0.38 IU/mL (ref 0.30–0.70)

## 2011-12-07 MED ORDER — WARFARIN - PHARMACIST DOSING INPATIENT
Freq: Every day | Status: DC
  Administered 2011-12-08: 18:00:00

## 2011-12-07 MED ORDER — POTASSIUM CHLORIDE 10 MEQ/100ML IV SOLN
INTRAVENOUS | Status: AC
  Filled 2011-12-07: qty 300

## 2011-12-07 MED ORDER — WARFARIN VIDEO
Freq: Once | Status: AC
  Administered 2011-12-07: 14:00:00

## 2011-12-07 MED ORDER — COUMADIN BOOK
Freq: Once | Status: AC
  Administered 2011-12-07: 14:00:00
  Filled 2011-12-07: qty 1

## 2011-12-07 MED ORDER — IPRATROPIUM BROMIDE 0.02 % IN SOLN
0.5000 mg | Freq: Four times a day (QID) | RESPIRATORY_TRACT | Status: DC
  Administered 2011-12-08 – 2011-12-10 (×7): 0.5 mg via RESPIRATORY_TRACT
  Filled 2011-12-07 (×5): qty 2.5

## 2011-12-07 MED ORDER — ALBUTEROL SULFATE (5 MG/ML) 0.5% IN NEBU
2.5000 mg | INHALATION_SOLUTION | Freq: Four times a day (QID) | RESPIRATORY_TRACT | Status: DC
  Administered 2011-12-08 – 2011-12-10 (×7): 2.5 mg via RESPIRATORY_TRACT
  Filled 2011-12-07 (×6): qty 0.5

## 2011-12-07 MED ORDER — WARFARIN SODIUM 7.5 MG PO TABS
7.5000 mg | ORAL_TABLET | Freq: Once | ORAL | Status: AC
  Administered 2011-12-07: 7.5 mg via ORAL
  Filled 2011-12-07: qty 1

## 2011-12-07 MED ORDER — PATIENT'S GUIDE TO USING COUMADIN BOOK
Freq: Once | Status: AC
  Administered 2011-12-07: 10:00:00
  Filled 2011-12-07: qty 1

## 2011-12-07 NOTE — Progress Notes (Signed)
ANTICOAGULATION CONSULT NOTE - Follow Up Consult  Pharmacy Consult for Heparin Indication:   Allergies  Allergen Reactions  . Bee Anaphylaxis  . Etodolac Nausea And Vomiting  . Morphine Other (See Comments)    Hallucinations  . Propoxyphene N-Acetaminophen Nausea And Vomiting  . Penicillins Rash    Patient Measurements: Height: 5\' 6"  (167.6 cm) Weight: 232 lb 5.8 oz (105.4 kg) IBW/kg (Calculated) : 59.3  Heparin Dosing Weight:   Vital Signs: Temp: 97.5 F (36.4 C) (03/22 0008) Temp src: Oral (03/22 0008) BP: 120/73 mmHg (03/21 2230) Pulse Rate: 92  (03/22 0008)  Labs:  Basename 12/07/11 0030 12/06/11 1547 12/06/11 0505 12/05/11 2345 12/05/11 1920  HGB -- -- 10.6* -- 12.1  HCT -- -- 34.2* -- 38.8  PLT -- -- 396 -- 418*  APTT -- 37 -- -- --  LABPROT -- 14.6 -- -- --  INR -- 1.12 -- -- --  HEPARINUNFRC 0.20* -- -- -- --  CREATININE -- -- 0.53 -- 0.49*  CKTOTAL -- -- 45 52 58  CKMB -- -- 1.7 1.8 1.8  TROPONINI -- -- <0.30 <0.30 <0.30   Estimated Creatinine Clearance: 84.8 ml/min (by C-G formula based on Cr of 0.53).   Medications:  Infusions:    . heparin 1,400 Units/hr (12/06/11 1618)  . DISCONTD: dextrose 5 % and 0.9 % NaCl with KCl 20 mEq/L 75 mL/hr at 12/05/11 2300    Assessment: Patient with low heparin level.  No issues per RN.  Goal of Therapy:  Heparin level 0.3-0.7 units/ml   Plan:  Increase heparin to 1600 units/hr, f/u with level at 0800 Darlina Guys, Jacquenette Shone Crowford 12/07/2011,1:28 AM

## 2011-12-07 NOTE — Progress Notes (Signed)
Right upper extremity venous duplex completed.  Preliminary report is negative for DVT.  There appears to be superficial thrombus in the right cephalic vein. 

## 2011-12-07 NOTE — Progress Notes (Signed)
ANTICOAGULATION CONSULT NOTE - Follow Up Consult  Pharmacy Consult for IV heparin Indication: pulmonary embolus  Allergies  Allergen Reactions  . Bee Anaphylaxis  . Etodolac Nausea And Vomiting  . Morphine Other (See Comments)    Hallucinations  . Propoxyphene N-Acetaminophen Nausea And Vomiting  . Penicillins Rash    Patient Measurements: Height: 5\' 6"  (167.6 cm) Weight: 232 lb 5.8 oz (105.4 kg) IBW/kg (Calculated) : 59.3   Vital Signs: Temp: 98.2 F (36.8 C) (03/22 1148) Temp src: Oral (03/22 1148) Pulse Rate: 110  (03/22 1300)  Labs:  Basename 12/07/11 1502 12/07/11 0800 12/07/11 0030 12/06/11 1547 12/06/11 0505 12/05/11 2345 12/05/11 1920  HGB -- -- -- -- 10.6* -- 12.1  HCT -- -- -- -- 34.2* -- 38.8  PLT -- -- -- -- 396 -- 418*  APTT -- -- -- 37 -- -- --  LABPROT -- -- -- 14.6 -- -- --  INR -- -- -- 1.12 -- -- --  HEPARINUNFRC 0.24* 0.38 0.20* -- -- -- --  CREATININE -- 0.57 -- -- 0.53 -- 0.49*  CKTOTAL -- -- -- -- 45 52 58  CKMB -- -- -- -- 1.7 1.8 1.8  TROPONINI -- -- -- -- <0.30 <0.30 <0.30   Estimated Creatinine Clearance: 84.8 ml/min (by C-G formula based on Cr of 0.57).   Assessment:  [name] admitted 3/12 with acute appendicitis s/p lap appendectomy on 3/12, now found to have bilateral PE. Patient started on IV heparin 3/21 to add coumadin today.  First therapeutic HL this AM, 2nd level subtherapeutic  Coumadin score = 4.  Baseline PT/INR wnl.   No CBC this AM, Hgb down to 10.6 on 3/21.  No bleeding/complications noted/reported.  Goal of Therapy:  Heparin level 0.3-0.7   Plan:   Increase heparin 2000 units/hr per protocol  Recheck heparin level in 6hr   Loralee Pacas, PharmD, BCPS Pager: [phone] 12/07/2011,4:19 PM

## 2011-12-07 NOTE — Progress Notes (Signed)
Patient placed on CPAP with 2 liters of O2 bled in. Patient tolerating well at this time. Encourage pt to call with any problems.

## 2011-12-07 NOTE — Progress Notes (Signed)
[facility] Inpatient Consult Follow Up Note  12/07/2011  Subjective: Pt awake, no distress, no complaints, asking lots of questions this morning  Objective:  Vital signs in last 24 hours: Filed Vitals:   12/06/11 2200 12/06/11 2230 12/07/11 0008 12/07/11 0400  BP:  120/73    Pulse: 104 100 92   Temp:   97.5 F (36.4 C) 98.5 F (36.9 C)  TempSrc:   Oral Oral  Resp: 16 19 18    Height:      Weight:   105.4 kg (232 lb 5.8 oz)   SpO2: 94% 95% 92%    Weight change:   Intake/Output Summary (Last 24 hours) at 12/07/11 0750 Last data filed at 12/07/11 0500  Gross per 24 hour  Intake   1718 ml  Output   3552 ml  Net  -1834 ml   Lab Results  Component Value Date   HGBA1C 6.7* 11/28/2011   Lab Results  Component Value Date   CREATININE 0.53 12/06/2011    Review of Systems As above, otherwise all reviewed and reported negative  Physical Exam General: Patient awake, alert, no distress Cardiovascular: normal s1, s2 sounds, tachy Lungs: Decreased breath sounds at the bases.  Abdomen: Positive bowel sounds, wounds clean dry.  Extremities: scds Neuro - awake and oriented x 3.   Lab Results: Results for orders placed during the hospital encounter of 11/27/11 (from the past 24 hour(s))  GLUCOSE, CAPILLARY     Status: Normal   Collection Time   12/06/11 12:40 PM      Component Value Range   Glucose-Capillary 95  70 - 99 (mg/dL)   Comment 1 Notify RN    PROTIME-INR     Status: Normal   Collection Time   12/06/11  3:47 PM      Component Value Range   Prothrombin Time 14.6  11.6 - 15.2 (seconds)   INR 1.12  0.00 - 1.49   APTT     Status: Normal   Collection Time   12/06/11  3:47 PM      Component Value Range   aPTT 37  24 - 37 (seconds)  GLUCOSE, CAPILLARY     Status: Normal   Collection Time   12/06/11  6:48 PM      Component Value Range   Glucose-Capillary 98  70 - 99 (mg/dL)   Comment 1 Documented in Chart     Comment 2 Notify RN    HEPARIN LEVEL  (UNFRACTIONATED)     Status: Abnormal   Collection Time   12/07/11 12:30 AM      Component Value Range   Heparin Unfractionated 0.20 (*) 0.30 - 0.70 (IU/mL)  GLUCOSE, CAPILLARY     Status: Abnormal   Collection Time   12/07/11  3:13 AM      Component Value Range   Glucose-Capillary 105 (*) 70 - 99 (mg/dL)  GLUCOSE, CAPILLARY     Status: Normal   Collection Time   12/07/11  6:06 AM      Component Value Range   Glucose-Capillary 99  70 - 99 (mg/dL)    Micro Results: Recent Results (from the past 240 hour(s))  MRSA PCR SCREENING     Status: Normal   Collection Time   11/28/11  2:53 AM      Component Value Range Status Comment   MRSA by PCR NEGATIVE  NEGATIVE  Final     Medications:  Scheduled Meds:   . ipratropium  0.5 mg Nebulization Q4H  And  . albuterol  2.5 mg Nebulization Q4H  . antiseptic oral rinse  15 mL Mouth Rinse q12n4p  . chlorhexidine  15 mL Mouth Rinse BID  . clonazePAM  0.5 mg Oral QHS  . cyclobenzaprine  5 mg Oral QHS  . DULoxetine  60 mg Oral Daily  . gabapentin  800 mg Oral BID  . heparin  4,000 Units Intravenous Once  . insulin aspart  0-20 Units Subcutaneous Q6H  . levothyroxine  25 mcg Oral QAC breakfast  . lidocaine   Other TID  . mometasone-formoterol  2 puff Inhalation BID  . oxyCODONE-acetaminophen  1 tablet Oral BID   And  . oxyCODONE  5 mg Oral BID  . pantoprazole  40 mg Oral Q1200  . potassium chloride  20 mEq Oral TID  . saccharomyces boulardii  250 mg Oral BID  . DISCONTD: aztreonam  2 g Intravenous Q8H  . DISCONTD: levofloxacin (LEVAQUIN) IV  750 mg Intravenous Q24H  . DISCONTD: vancomycin  1,000 mg Intravenous Q12H   Continuous Infusions:   . heparin 1,600 Units/hr (12/07/11 0045)  . DISCONTD: dextrose 5 % and 0.9 % NaCl with KCl 20 mEq/L 75 mL/hr at 12/05/11 2300   PRN Meds:.albuterol, ALPRAZolam, benzonatate, iohexol, ipratropium, levalbuterol, ondansetron, ondansetron, promethazine, sodium chloride,  SUMAtriptan  Assessment/Plan: Appendicitis, acute, with peritonitis (11/27/2011)  Status post appendectomy. Managed by general surgery team Plan to ambulate today, encouraged pt to use incentive spirometry today Pt showing improvement overall.   Hypokalemia - supplemental K ordered  Shortness of breath - improving, continue supplemental O2 as needed. Use IS for her atelectasis.    DIABETES MELLITUS, TYPE II (04/27/2010)  Blood sugars controlled with supplemental insulin   MIGRAINE HEADACHE (04/27/2010)  Stable, sumatriptan prn.  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 Chest x-ray was question of pneumonia. CT scan did not show any pneumonia or lymphadenopathy. Will discontinue empiric antibiotic. DC bed rest. Lower extremity Dopplers negative. Request coumadin video and education today.   Thrombophlebitis (12/06/2011) warm compresses when necessary. Doppler US negative for DVT lower extremities    LOS: 10 days   Gwyneth Fernandez 12/07/2011, 7:50 AM   Cleora Fleet, MD, CDE, FAAFP [facility] [place], [place]  [phone]

## 2011-12-07 NOTE — Progress Notes (Signed)
Patient ID: Hayley [name], female   DOB: 11-Dec-1945, 66 y.o.   MRN: [id number] 10 Days Post-Op  Subjective: Breathing easier today and sitting up in bed. Tolerating full liquids well and had BM x 2. Objective: Vital signs in last 24 hours: Temp:  [97.5 F (36.4 C)-98.7 F (37.1 C)] 98.7 F (37.1 C) (03/22 0812) Pulse Rate:  [90-107] 92  (03/22 0008) Resp:  [15-22] 18  (03/22 0008) BP: (109-125)/(63-90) 120/73 mmHg (03/21 2230) SpO2:  [88 %-99 %] 92 % (03/22 0008) Weight:  [104.6 kg (230 lb 9.6 oz)-105.4 kg (232 lb 5.8 oz)] 105.4 kg (232 lb 5.8 oz) (03/22 0008) Last BM Date: 12/06/11  Intake/Output from previous day: 03/21 0701 - 03/22 0700 In: 1718 [P.O.:1440; I.V.:98] Out: 3552 [Urine:3550; Stool:2] Intake/Output this shift:    General appearance: alert, cooperative and no distress, less SOB with talking Resp: moving air bilaterally with some very mild expiratory wheezing GI: soft, obese,+BS, incision healing well  Lab Results:   [facility] 12/06/11 0505 12/05/11 1920  WBC 6.0 7.0  HGB 10.6* 12.1  HCT 34.2* 38.8  PLT 396 418*    BMET  Basename 12/07/11 0800 12/06/11 0505  NA 138 142  K 4.0 3.3*  CL 102 106  CO2 33* 29  GLUCOSE 103* 124*  BUN PENDING <3*  CREATININE 0.57 0.53  CALCIUM 8.6 8.3*   PT/INR  Basename 12/06/11 1547  LABPROT 14.6  INR 1.12     Lab 12/06/11 0505 12/05/11 1920  AST 12 15  ALT 6 6  ALKPHOS 48 52  BILITOT 0.2* 0.2*  PROT 6.0 6.7  ALBUMIN 2.4* 2.6*     Lipase     Component Value Date/Time   LIPASE 35 11/27/2011 1331     Studies/Results: Dg Chest 2 View  12/05/2011  *RADIOLOGY REPORT*  Clinical Data: Wheezing.  Cough.  Chest discomfort.  CHEST - 2 VIEW  Comparison: 11/27/2011  Findings: There is worsening of atelectasis and mild infiltrate in both lower lungs.  The upper lobes are clear.  No effusions.  No significant bony finding.  IMPRESSION: Worsening of atelectasis and patchy infiltrate at both lung bases.  Original Report  Authenticated By: Thomasenia Sales, M.D.   Ct Angio Chest W/cm &/or Wo Cm  12/06/2011  *RADIOLOGY REPORT*  Clinical Data: SOB, elevated D-dimer, recent appendectomy, evaluate for PE  CT ANGIOGRAPHY CHEST  Technique:  Multidetector CT imaging of the chest using the standard protocol during bolus administration of intravenous contrast. Multiplanar reconstructed images including MIPs were obtained and reviewed to evaluate the vascular anatomy.  Contrast:  100 ml Omnipaque-300 IV  Comparison: Chest radiographs dated 12/05/2011.  Findings: Nonocclusive filling defects within segmental/subsegmental pulmonary arteries in all lobes, most prominent in the right upper lobe (series 7/image 107).  Overall clot burden is small to moderate.  Dependent atelectasis in the bilateral lower lobes.  No superimposed opacities suspicious for pneumonia.  Visualized thyroid is mildly heterogeneous.  The heart is normal in size.  No pericardial effusion.  No suspicious mediastinal, hilar, or axillary lymphadenopathy.  Visualized upper abdomen is notable for hepatic steatosis.  Bilateral breast augmentation.  Mild degenerative changes of the visualized thoracolumbar spine.  IMPRESSION: Bilateral segmental/subsegmental pulmonary emboli.  Overall clot burden is small to moderate.  Critical Value/emergent results were called by telephone at the time of interpretation on 12/06/2011  at 1258 hours  to  Elnita Maxwell, the RN caring for the patient, who verbally acknowledged these results.  Original Report Authenticated By:  Charline Bills, M.D.    Medications:    . ipratropium  0.5 mg Nebulization Q4H   And  . albuterol  2.5 mg Nebulization Q4H  . antiseptic oral rinse  15 mL Mouth Rinse q12n4p  . chlorhexidine  15 mL Mouth Rinse BID  . clonazePAM  0.5 mg Oral QHS  . cyclobenzaprine  5 mg Oral QHS  . DULoxetine  60 mg Oral Daily  . gabapentin  800 mg Oral BID  . heparin  4,000 Units Intravenous Once  . insulin aspart  0-20 Units  Subcutaneous Q6H  . levothyroxine  25 mcg Oral QAC breakfast  . lidocaine   Other TID  . mometasone-formoterol  2 puff Inhalation BID  . oxyCODONE-acetaminophen  1 tablet Oral BID   And  . oxyCODONE  5 mg Oral BID  . pantoprazole  40 mg Oral Q1200  . patient's guide to using coumadin book   Does not apply Once  . potassium chloride  20 mEq Oral TID  . saccharomyces boulardii  250 mg Oral BID  . warfarin   Does not apply Once  . DISCONTD: aztreonam  2 g Intravenous Q8H  . DISCONTD: levofloxacin (LEVAQUIN) IV  750 mg Intravenous Q24H  . DISCONTD: vancomycin  1,000 mg Intravenous Q12H    Assessment/Plan Acute appendicitis with phlegmon, probable perforated s/APPENDECTOMY LAPAROSCOPIC 11/27/11. DR. Michaell Cowing                       1) Post-op ileus: improving, will advance to regular diet 2) Pelvic pain: Etiology unclear ?neuropathy.  3) S/P appendectomy for acute appendicitis with peritonitis.  4) GERD: On Pantoprazole.  5) Diverticulosis.  6) Fatty liver.  7) Sleep apnea: on C-PAP. 8) PE- now on Heparin gtt and starting warfarin Plan: Advance diet, mobilize OOB again    LOS: 10 days    Tanetta Fuhriman,PA-C Pager [phone] General Trauma Pager [phone]

## 2011-12-07 NOTE — Progress Notes (Signed)
ANTICOAGULATION CONSULT NOTE - Follow Up Consult  Pharmacy Consult for IV heparin, Coumadin Indication: pulmonary embolus  Allergies  Allergen Reactions  . Bee Anaphylaxis  . Etodolac Nausea And Vomiting  . Morphine Other (See Comments)    Hallucinations  . Propoxyphene N-Acetaminophen Nausea And Vomiting  . Penicillins Rash    Patient Measurements: Height: 5\' 6"  (167.6 cm) Weight: 232 lb 5.8 oz (105.4 kg) IBW/kg (Calculated) : 59.3   Vital Signs: Temp: 98.7 F (37.1 C) (03/22 0812) Temp src: Oral (03/22 0812) BP: 120/73 mmHg (03/21 2230) Pulse Rate: 92  (03/22 0008)  Labs:  Basename 12/07/11 0800 12/07/11 0030 12/06/11 1547 12/06/11 0505 12/05/11 2345 12/05/11 1920  HGB -- -- -- 10.6* -- 12.1  HCT -- -- -- 34.2* -- 38.8  PLT -- -- -- 396 -- 418*  APTT -- -- 37 -- -- --  LABPROT -- -- 14.6 -- -- --  INR -- -- 1.12 -- -- --  HEPARINUNFRC 0.38 0.20* -- -- -- --  CREATININE 0.57 -- -- 0.53 -- 0.49*  CKTOTAL -- -- -- 45 52 58  CKMB -- -- -- 1.7 1.8 1.8  TROPONINI -- -- -- <0.30 <0.30 <0.30   Estimated Creatinine Clearance: 84.8 ml/min (by C-G formula based on Cr of 0.57).   Assessment:  [name] admitted 3/12 with acute appendicitis s/p lap appendectomy on 3/12, now found to have bilateral PE. Patient started on IV heparin 3/21 to add coumadin today.  First therapeutic HL this AM, will confirm with 2nd level at [facility] today  Coumadin score = 4.  Baseline PT/INR wnl.   No CBC this AM, Hgb down to 10.6 on 3/21.  No bleeding/complications noted/reported.  MD ordered for Coumadin video this AM   Goal of Therapy:  INR 2-3 Heparin level 0.3-0.7 units/ml   Plan:   Continue heparin at 1600 units/hr.  Will check heparin level at [facility] for confirmation.  Coumadin 7.5 mg po x 1 tonight  Daily PT/INR  Coumadin booklet  Will provide patient with Coumadin education when appropriate.  Hayley [name] Hayley [name] 12/07/2011,8:54 AM

## 2011-12-08 ENCOUNTER — Inpatient Hospital Stay (HOSPITAL_COMMUNITY): Payer: Medicare Other

## 2011-12-08 DIAGNOSIS — I10 Essential (primary) hypertension: Secondary | ICD-10-CM | POA: Diagnosis not present

## 2011-12-08 DIAGNOSIS — R0602 Shortness of breath: Secondary | ICD-10-CM | POA: Diagnosis not present

## 2011-12-08 DIAGNOSIS — I2699 Other pulmonary embolism without acute cor pulmonale: Secondary | ICD-10-CM | POA: Diagnosis not present

## 2011-12-08 DIAGNOSIS — G4730 Sleep apnea, unspecified: Secondary | ICD-10-CM | POA: Diagnosis not present

## 2011-12-08 DIAGNOSIS — I517 Cardiomegaly: Secondary | ICD-10-CM | POA: Diagnosis not present

## 2011-12-08 DIAGNOSIS — J9819 Other pulmonary collapse: Secondary | ICD-10-CM | POA: Diagnosis not present

## 2011-12-08 DIAGNOSIS — G4710 Hypersomnia, unspecified: Secondary | ICD-10-CM | POA: Diagnosis not present

## 2011-12-08 DIAGNOSIS — J45909 Unspecified asthma, uncomplicated: Secondary | ICD-10-CM | POA: Diagnosis not present

## 2011-12-08 LAB — CBC
HCT: 34.5 % — ABNORMAL LOW (ref 36.0–46.0)
Hemoglobin: 10.7 g/dL — ABNORMAL LOW (ref 12.0–15.0)
MCH: 27.7 pg (ref 26.0–34.0)
MCHC: 31 g/dL (ref 30.0–36.0)
MCV: 89.4 fL (ref 78.0–100.0)
Platelets: 423 10*3/uL — ABNORMAL HIGH (ref 150–400)
RBC: 3.86 MIL/uL — ABNORMAL LOW (ref 3.87–5.11)
RDW: 14.6 % (ref 11.5–15.5)
WBC: 7.3 10*3/uL (ref 4.0–10.5)

## 2011-12-08 LAB — GLUCOSE, CAPILLARY
Glucose-Capillary: 109 mg/dL — ABNORMAL HIGH (ref 70–99)
Glucose-Capillary: 110 mg/dL — ABNORMAL HIGH (ref 70–99)
Glucose-Capillary: 136 mg/dL — ABNORMAL HIGH (ref 70–99)
Glucose-Capillary: 86 mg/dL (ref 70–99)

## 2011-12-08 LAB — COMPREHENSIVE METABOLIC PANEL
ALT: 6 U/L (ref 0–35)
AST: 11 U/L (ref 0–37)
Albumin: 2.6 g/dL — ABNORMAL LOW (ref 3.5–5.2)
Alkaline Phosphatase: 53 U/L (ref 39–117)
BUN: 3 mg/dL — ABNORMAL LOW (ref 6–23)
CO2: 28 mEq/L (ref 19–32)
Calcium: 8.8 mg/dL (ref 8.4–10.5)
Chloride: 102 mEq/L (ref 96–112)
Creatinine, Ser: 0.56 mg/dL (ref 0.50–1.10)
GFR calc Af Amer: >90 mL/min (ref >90)
GFR calc non Af Amer: >90 mL/min (ref >90)
Glucose, Bld: 109 mg/dL — ABNORMAL HIGH (ref 70–99)
Potassium: 4 mEq/L (ref 3.5–5.1)
Sodium: 138 mEq/L (ref 135–145)
Total Bilirubin: 0.2 mg/dL — ABNORMAL LOW (ref 0.3–1.2)
Total Protein: 6.1 g/dL (ref 6.0–8.3)

## 2011-12-08 LAB — PROTIME-INR
INR: 1.13 (ref 0.00–1.49)
Prothrombin Time: 14.7 seconds (ref 11.6–15.2)

## 2011-12-08 LAB — MAGNESIUM: Magnesium: 2.1 mg/dL (ref 1.5–2.5)

## 2011-12-08 LAB — HEPARIN LEVEL (UNFRACTIONATED)
Heparin Unfractionated: 0.54 IU/mL (ref 0.30–0.70)
Heparin Unfractionated: 0.88 IU/mL — ABNORMAL HIGH (ref 0.30–0.70)
Heparin Unfractionated: 0.46 IU/mL (ref 0.30–0.70)

## 2011-12-08 MED ORDER — GUAIFENESIN ER 600 MG PO TB12
600.0000 mg | ORAL_TABLET | Freq: Two times a day (BID) | ORAL | Status: DC | PRN
  Administered 2011-12-08: 600 mg via ORAL
  Filled 2011-12-08 (×2): qty 1

## 2011-12-08 MED ORDER — MAGNESIUM HYDROXIDE NICU ORAL SYRINGE 400 MG/5 ML: 30.0000 mL | Freq: Every day | ORAL | Status: DC

## 2011-12-08 MED ORDER — MAGNESIUM HYDROXIDE 400 MG/5ML PO SUSP
30.0000 mL | Freq: Every day | ORAL | Status: DC
  Administered 2011-12-08 – 2011-12-10 (×3): 30 mL via ORAL
  Filled 2011-12-08 (×3): qty 30

## 2011-12-08 MED ORDER — VITAMINS A & D EX OINT
TOPICAL_OINTMENT | CUTANEOUS | Status: AC
  Administered 2011-12-09: 07:00:00
  Filled 2011-12-08: qty 5

## 2011-12-08 MED ORDER — PROMETHAZINE-CODEINE 6.25-10 MG/5ML PO SYRP
5.0000 mL | ORAL_SOLUTION | Freq: Four times a day (QID) | ORAL | Status: DC | PRN
  Administered 2011-12-08 – 2011-12-11 (×9): 5 mL via ORAL
  Filled 2011-12-08 (×8): qty 5

## 2011-12-08 MED ORDER — WARFARIN SODIUM 7.5 MG PO TABS
7.5000 mg | ORAL_TABLET | Freq: Once | ORAL | Status: AC
  Administered 2011-12-08: 7.5 mg via ORAL
  Filled 2011-12-08: qty 1

## 2011-12-08 NOTE — Progress Notes (Signed)
11 Days Post-Op  Subjective: Passing some gas and having an occasional BM.  Her main complaint is of a cough that is persistent.  Breathing better today.  Objective: Vital signs in last 24 hours: Temp:  [98 F (36.7 C)-98.9 F (37.2 C)] 98 F (36.7 C) (03/23 0510) Pulse Rate:  [89-110] 89  (03/23 0510) Resp:  [16-22] 16  (03/23 0510) BP: (111-114)/(74-78) 114/74 mmHg (03/23 0510) SpO2:  [88 %-97 %] 94 % (03/23 0510) Last BM Date: 12/06/11  Intake/Output from previous day: 03/22 0701 - 03/23 0700 In: 2342.4 [P.O.:1920; I.V.:422.4] Out: 4200 [Urine:4200] Intake/Output this shift:    PE: Lungs-clear Abd-soft, incisions clean, dry, and intact  Lab Results:   Basename 12/08/11 0603 12/06/11 0505  WBC 7.3 6.0  HGB 10.7* 10.6*  HCT 34.5* 34.2*  PLT 423* 396   BMET  Basename 12/08/11 0603 12/07/11 0800  NA 138 138  K 4.0 4.0  CL 102 102  CO2 28 33*  GLUCOSE 109* 103*  BUN 3* <3*  CREATININE 0.56 0.57  CALCIUM 8.8 8.6   PT/INR  Basename 12/08/11 0603 12/06/11 1547  LABPROT 14.7 14.6  INR 1.13 1.12   Comprehensive Metabolic Panel:    Component Value Date/Time   NA 138 12/08/2011 0603   K 4.0 12/08/2011 0603   CL 102 12/08/2011 0603   CO2 28 12/08/2011 0603   BUN 3* 12/08/2011 0603   CREATININE 0.56 12/08/2011 0603   GLUCOSE 109* 12/08/2011 0603   CALCIUM 8.8 12/08/2011 0603   AST 11 12/08/2011 0603   ALT 6 12/08/2011 0603   ALKPHOS 53 12/08/2011 0603   BILITOT 0.2* 12/08/2011 0603   PROT 6.1 12/08/2011 0603   ALBUMIN 2.6* 12/08/2011 0603     Studies/Results: Ct Angio Chest W/cm &/or Wo Cm  12/06/2011  *RADIOLOGY REPORT*  Clinical Data: SOB, elevated D-dimer, recent appendectomy, evaluate for PE  CT ANGIOGRAPHY CHEST  Technique:  Multidetector CT imaging of the chest using the standard protocol during bolus administration of intravenous contrast. Multiplanar reconstructed images including MIPs were obtained and reviewed to evaluate the vascular anatomy.  Contrast:   100 ml Omnipaque-300 IV  Comparison: Chest radiographs dated 12/05/2011.  Findings: Nonocclusive filling defects within segmental/subsegmental pulmonary arteries in all lobes, most prominent in the right upper lobe (series 7/image 107).  Overall clot burden is small to moderate.  Dependent atelectasis in the bilateral lower lobes.  No superimposed opacities suspicious for pneumonia.  Visualized thyroid is mildly heterogeneous.  The heart is normal in size.  No pericardial effusion.  No suspicious mediastinal, hilar, or axillary lymphadenopathy.  Visualized upper abdomen is notable for hepatic steatosis.  Bilateral breast augmentation.  Mild degenerative changes of the visualized thoracolumbar spine.  IMPRESSION: Bilateral segmental/subsegmental pulmonary emboli.  Overall clot burden is small to moderate.  Critical Value/emergent results were called by telephone at the time of interpretation on 12/06/2011  at 1258 hours  to  Elnita Maxwell, the RN caring for the patient, who verbally acknowledged these results.  Original Report Authenticated By: Charline Bills, M.D.    Anti-infectives: Anti-infectives     Start     Dose/Rate Route Frequency Ordered Stop   12/06/11 1400   vancomycin (VANCOCIN) IVPB 1000 mg/200 mL premix  Status:  Discontinued        1,000 mg 200 mL/hr over 60 Minutes Intravenous Every 12 hours 12/06/11 1112 12/06/11 1856   12/06/11 1200   aztreonam (AZACTAM) 2 g in dextrose 5 % 50 mL IVPB  Status:  Discontinued        2 g 100 mL/hr over 30 Minutes Intravenous 3 times per day 12/06/11 1112 12/06/11 1856   12/06/11 1200   Levofloxacin (LEVAQUIN) IVPB 750 mg  Status:  Discontinued        750 mg 100 mL/hr over 90 Minutes Intravenous Every 24 hours 12/06/11 1112 12/06/11 1911   11/28/11 2130   ertapenem (INVANZ) 1 g in sodium chloride 0.9 % 50 mL IVPB        1 g 100 mL/hr over 30 Minutes Intravenous Every 24 hours 11/28/11 0201 12/05/11 2129   11/27/11 2130   ertapenem (INVANZ) 1 g  in sodium chloride 0.9 % 50 mL IVPB  Status:  Discontinued        1 g 100 mL/hr over 30 Minutes Intravenous Every 24 hours 11/27/11 2104 11/28/11 0201          Assessment Principal Problem:  *Appendicitis, acute, with peritonitis and postop ileus that has resolved.  No BM in two day. Passing gas and tolerating diet. Active Problems:  DIABETES MELLITUS, TYPE II  MIGRAINE HEADACHE  IBS  DYSPNEA  Spasmodic dysphonia  Pulmonary embolism, acute-on IV Heparin, Coumadin started  Thrombophlebitis  Chronic cough-worse in hospital   LOS: 11 days   Plan: Milk of Magnesia.  Will let Medicine team address cough.   Jenavi Beedle Shela Commons 12/08/2011

## 2011-12-08 NOTE — Progress Notes (Signed)
ANTICOAGULATION CONSULT NOTE - Follow Up Consult  Pharmacy Consult for IV heparin Indication: pulmonary embolus  Allergies  Allergen Reactions  . Bee Anaphylaxis  . Etodolac Nausea And Vomiting  . Morphine Other (See Comments)    Hallucinations  . Propoxyphene N-Acetaminophen Nausea And Vomiting  . Penicillins Rash   Patient Measurements: Height: 5\' 6"  (167.6 cm) Weight: 232 lb 5.8 oz (105.4 kg) IBW/kg (Calculated) : 59.3   Vital Signs: Temp: 97.5 F (36.4 C) (03/23 1427) Temp src: Oral (03/23 1427) BP: 133/77 mmHg (03/23 1427) Pulse Rate: 101  (03/23 1427)  Labs:  Basename 12/08/11 1559 12/08/11 0830 12/08/11 0603 12/08/11 0020 12/07/11 0800 12/06/11 1547 12/06/11 0505 12/05/11 2345 12/05/11 1920  HGB -- -- 10.7* -- -- -- 10.6* -- --  HCT -- -- 34.5* -- -- -- 34.2* -- 38.8  PLT -- -- 423* -- -- -- 396 -- 418*  APTT -- -- -- -- -- 37 -- -- --  LABPROT -- -- 14.7 -- -- 14.6 -- -- --  INR -- -- 1.13 -- -- 1.12 -- -- --  HEPARINUNFRC 0.46 0.88* -- 0.54 -- -- -- -- --  CREATININE -- -- 0.56 -- 0.57 -- 0.53 -- --  CKTOTAL -- -- -- -- -- -- 45 52 58  CKMB -- -- -- -- -- -- 1.7 1.8 1.8  TROPONINI -- -- -- -- -- -- <0.30 <0.30 <0.30   Estimated Creatinine Clearance: 84.8 ml/min (by C-G formula based on Cr of 0.56).  Assessment:  [name] s/p lap appendectomy on 3/12.  Developed acute bilateral PE.  Heparin/Coumadin overlap day # 2 of 5 minimum.  Heparin level in target range.  Goal of Therapy:  Heparin level 0.3-0.7   Plan:   Cont heparin at current rate 1500 units/hr.  F/u am labs.  Charolotte Eke, PharmD, pager [phone]. 12/08/2011,4:47 PM.

## 2011-12-08 NOTE — Progress Notes (Addendum)
ANTICOAGULATION CONSULT NOTE - Follow Up Consult  Pharmacy Consult for IV heparin Indication: pulmonary embolus  Allergies  Allergen Reactions  . Bee Anaphylaxis  . Etodolac Nausea And Vomiting  . Morphine Other (See Comments)    Hallucinations  . Propoxyphene N-Acetaminophen Nausea And Vomiting  . Penicillins Rash    Patient Measurements: Height: 5\' 6"  (167.6 cm) Weight: 232 lb 5.8 oz (105.4 kg) IBW/kg (Calculated) : 59.3   Vital Signs: Temp: 98 F (36.7 C) (03/23 0510) Temp src: Axillary (03/23 0510) BP: 114/74 mmHg (03/23 0510) Pulse Rate: 89  (03/23 0510)  Labs:  Basename 12/08/11 0830 12/08/11 0603 12/08/11 0020 12/07/11 1502 12/07/11 0800 12/06/11 1547 12/06/11 0505 12/05/11 2345 12/05/11 1920  HGB -- 10.7* -- -- -- -- 10.6* -- --  HCT -- 34.5* -- -- -- -- 34.2* -- 38.8  PLT -- 423* -- -- -- -- 396 -- 418*  APTT -- -- -- -- -- 37 -- -- --  LABPROT -- 14.7 -- -- -- 14.6 -- -- --  INR -- 1.13 -- -- -- 1.12 -- -- --  HEPARINUNFRC 0.88* -- 0.54 0.24* -- -- -- -- --  CREATININE -- 0.56 -- -- 0.57 -- 0.53 -- --  CKTOTAL -- -- -- -- -- -- 45 52 58  CKMB -- -- -- -- -- -- 1.7 1.8 1.8  TROPONINI -- -- -- -- -- -- <0.30 <0.30 <0.30   Estimated Creatinine Clearance: 84.8 ml/min (by C-G formula based on Cr of 0.56).   Assessment:  [name] s/p lap appendectomy on 3/12.  Developed acute bilateral PE.  Heparin/Coumadin overlap day # 2 of 5 minimum.  Heparin level elevated this am.  INR not really rising yet after 1st dose yesterday.  No bleeding/complications noted/reported.  Goal of Therapy:  Heparin level 0.3-0.7   Plan:   Decrease heparin to 1500 units/hr.  Recheck heparin level in 6hr.  Coumadin 7.5mg  today.  F/u daily.  Completed Coumadin education/counseling.  Charolotte Eke, PharmD, pager [phone]. 12/08/2011,9:20 AM.

## 2011-12-08 NOTE — Progress Notes (Signed)
ANTICOAGULATION CONSULT NOTE - Follow Up Consult  Pharmacy Consult for heparin Indication: pulmonary embolus  Allergies  Allergen Reactions  . Bee Anaphylaxis  . Etodolac Nausea And Vomiting  . Morphine Other (See Comments)    Hallucinations  . Propoxyphene N-Acetaminophen Nausea And Vomiting  . Penicillins Rash    Patient Measurements: Height: 5\' 6"  (167.6 cm) Weight: 232 lb 5.8 oz (105.4 kg) IBW/kg (Calculated) : 59.3  Heparin Dosing Weight:   Vital Signs: Temp: 98.3 F (36.8 C) (03/22 2205) Temp src: Oral (03/22 2205) BP: 111/78 mmHg (03/22 2205) Pulse Rate: 96  (03/22 2205)  Labs:  Basename 12/08/11 0020 12/07/11 1502 12/07/11 0800 12/06/11 1547 12/06/11 0505 12/05/11 2345 12/05/11 1920  HGB -- -- -- -- 10.6* -- 12.1  HCT -- -- -- -- 34.2* -- 38.8  PLT -- -- -- -- 396 -- 418*  APTT -- -- -- 37 -- -- --  LABPROT -- -- -- 14.6 -- -- --  INR -- -- -- 1.12 -- -- --  HEPARINUNFRC 0.54 0.24* 0.38 -- -- -- --  CREATININE -- -- 0.57 -- 0.53 -- 0.49*  CKTOTAL -- -- -- -- 45 52 58  CKMB -- -- -- -- 1.7 1.8 1.8  TROPONINI -- -- -- -- <0.30 <0.30 <0.30   Estimated Creatinine Clearance: 84.8 ml/min (by C-G formula based on Cr of 0.57).   Medications:  Infusions:    . heparin 2,000 Units/hr (12/07/11 2020)    Assessment: Patient with heparin level at goal.  No issues per RN.  Goal of Therapy:  Heparin level 0.3-0.7 units/ml   Plan:   Continue heparin drip at current rate, recheck level at0800  Darlina Guys, Jacquenette Shone Crowford 12/08/2011,1:39 AM

## 2011-12-08 NOTE — Progress Notes (Signed)
[facility] Inpatient Progress Note  12/08/2011  Subjective: Pt is having some persistent coughing, but is using her incentive spirometry device and ambulating well.   Objective:  Vital signs in last 24 hours: Filed Vitals:   12/07/11 2205 12/08/11 0510 12/08/11 1212 12/08/11 1427  BP: 111/78 114/74  133/77  Pulse: 96 89  101  Temp: 98.3 F (36.8 C) 98 F (36.7 C)  97.5 F (36.4 C)  TempSrc: Oral Axillary  Oral  Resp: 18 16  16   Height:      Weight:      SpO2: 93% 94% 90% 90%   Weight change:   Intake/Output Summary (Last 24 hours) at 12/08/11 1549 Last data filed at 12/08/11 1427  Gross per 24 hour  Intake 1974.4 ml  Output   2800 ml  Net -825.6 ml   Lab Results  Component Value Date   HGBA1C 6.7* 11/28/2011   Lab Results  Component Value Date   CREATININE 0.56 12/08/2011    Review of Systems As above, otherwise all reviewed and reported negative  Physical Exam General: Patient awake, alert, no distress, calm cooperative Cardiovascular: normal s1, s2 sounds Lungs: BBS clear no wheeze today Abdomen: Positive bowel sounds, wounds clean dry.  Extremities: scds bilateral  Neuro - awake and oriented x 3.   Lab Results: Results for orders placed during the hospital encounter of 11/27/11 (from the past 24 hour(s))  GLUCOSE, CAPILLARY     Status: Abnormal   Collection Time   12/08/11 12:03 AM      Component Value Range   Glucose-Capillary 109 (*) 70 - 99 (mg/dL)  HEPARIN LEVEL (UNFRACTIONATED)     Status: Normal   Collection Time   12/08/11 12:20 AM      Component Value Range   Heparin Unfractionated 0.54  0.30 - 0.70 (IU/mL)  CBC     Status: Abnormal   Collection Time   12/08/11  6:03 AM      Component Value Range   WBC 7.3  4.0 - 10.5 (K/uL)   RBC 3.86 (*) 3.87 - 5.11 (MIL/uL)   Hemoglobin 10.7 (*) 12.0 - 15.0 (g/dL)   HCT 16.1 (*) 09.6 - 46.0 (%)   MCV 89.4  78.0 - 100.0 (fL)   MCH 27.7  26.0 - 34.0 (pg)   MCHC 31.0  30.0 - 36.0 (g/dL)   RDW  04.5  40.9 - 81.1 (%)   Platelets 423 (*) 150 - 400 (K/uL)  COMPREHENSIVE METABOLIC PANEL     Status: Abnormal   Collection Time   12/08/11  6:03 AM      Component Value Range   Sodium 138  135 - 145 (mEq/L)   Potassium 4.0  3.5 - 5.1 (mEq/L)   Chloride 102  96 - 112 (mEq/L)   CO2 28  19 - 32 (mEq/L)   Glucose, Bld 109 (*) 70 - 99 (mg/dL)   BUN 3 (*) 6 - 23 (mg/dL)   Creatinine, Ser 9.14  0.50 - 1.10 (mg/dL)   Calcium 8.8  8.4 - 78.2 (mg/dL)   Total Protein 6.1  6.0 - 8.3 (g/dL)   Albumin 2.6 (*) 3.5 - 5.2 (g/dL)   AST 11  0 - 37 (U/L)   ALT 6  0 - 35 (U/L)   Alkaline Phosphatase 53  39 - 117 (U/L)   Total Bilirubin 0.2 (*) 0.3 - 1.2 (mg/dL)   GFR calc non Af Amer >90  >90 (mL/min)   GFR calc Af Amer >90  >  90 (mL/min)  MAGNESIUM     Status: Normal   Collection Time   12/08/11  6:03 AM      Component Value Range   Magnesium 2.1  1.5 - 2.5 (mg/dL)  PROTIME-INR     Status: Normal   Collection Time   12/08/11  6:03 AM      Component Value Range   Prothrombin Time 14.7  11.6 - 15.2 (seconds)   INR 1.13  0.00 - 1.49   GLUCOSE, CAPILLARY     Status: Abnormal   Collection Time   12/08/11  6:29 AM      Component Value Range   Glucose-Capillary 110 (*) 70 - 99 (mg/dL)  HEPARIN LEVEL (UNFRACTIONATED)     Status: Abnormal   Collection Time   12/08/11  8:30 AM      Component Value Range   Heparin Unfractionated 0.88 (*) 0.30 - 0.70 (IU/mL)  GLUCOSE, CAPILLARY     Status: Abnormal   Collection Time   12/08/11 12:06 PM      Component Value Range   Glucose-Capillary 136 (*) 70 - 99 (mg/dL)   Comment 1 Notify RN     Comment 2 Documented in Chart      Micro Results: No results found for this or any previous visit (from the past 240 hour(s)).  Medications:  Scheduled Meds:   . albuterol  2.5 mg Nebulization QID  . antiseptic oral rinse  15 mL Mouth Rinse q12n4p  . chlorhexidine  15 mL Mouth Rinse BID  . clonazePAM  0.5 mg Oral QHS  . cyclobenzaprine  5 mg Oral QHS  . DULoxetine   60 mg Oral Daily  . gabapentin  800 mg Oral BID  . insulin aspart  0-20 Units Subcutaneous Q6H  . ipratropium  0.5 mg Nebulization QID  . levothyroxine  25 mcg Oral QAC breakfast  . lidocaine   Other TID  . magnesium hydroxide  30 mL Oral Daily  . mometasone-formoterol  2 puff Inhalation BID  . oxyCODONE-acetaminophen  1 tablet Oral BID   And  . oxyCODONE  5 mg Oral BID  . pantoprazole  40 mg Oral Q1200  . potassium chloride      . potassium chloride  20 mEq Oral TID  . saccharomyces boulardii  250 mg Oral BID  . warfarin  7.5 mg Oral ONCE-1800  . warfarin  7.5 mg Oral ONCE-1800  . Warfarin - Pharmacist Dosing Inpatient   Does not apply q1800  . DISCONTD: albuterol  2.5 mg Nebulization Q4H  . DISCONTD: ipratropium  0.5 mg Nebulization Q4H  . DISCONTD: magnesium hydroxide  30 mL Oral Daily   Continuous Infusions:   . heparin 1,500 Units/hr (12/08/11 1001)   PRN Meds:.albuterol, ALPRAZolam, benzonatate, guaiFENesin, ipratropium, ondansetron, ondansetron, promethazine, promethazine-codeine, sodium chloride, SUMAtriptan, DISCONTD: levalbuterol  Assessment/Plan:  Appendicitis, acute, with peritonitis (11/27/2011)  Status post appendectomy. Managed by general surgery team  Plan ambulating well,  encouraged pt to use incentive spirometry today  Pt showing improvement overall.   Hypokalemia - supplemental K ordered   Shortness of breath - improving, continue supplemental O2 as needed. Use IS for her atelectasis. Repeat CXR today to follow up.    DIABETES MELLITUS, TYPE II (04/27/2010)  Blood sugars controlled with supplemental insulin   MIGRAINE HEADACHE (04/27/2010)  Stable, sumatriptan prn.   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 Chest x-ray was question of pneumonia. CT scan  did not show any pneumonia or lymphadenopathy. Will discontinue empiric antibiotic. DC bed rest. Lower extremity  Dopplers negative. Request coumadin video and education today.    LOS: 11 days   Juliza Machnik 12/08/2011, 3:50 PM   Cleora Fleet, MD, CDE, FAAFP [facility] [place], [place]  [phone]

## 2011-12-09 DIAGNOSIS — G4710 Hypersomnia, unspecified: Secondary | ICD-10-CM | POA: Diagnosis not present

## 2011-12-09 DIAGNOSIS — G4730 Sleep apnea, unspecified: Secondary | ICD-10-CM | POA: Diagnosis not present

## 2011-12-09 DIAGNOSIS — R0602 Shortness of breath: Secondary | ICD-10-CM | POA: Diagnosis not present

## 2011-12-09 DIAGNOSIS — I10 Essential (primary) hypertension: Secondary | ICD-10-CM | POA: Diagnosis not present

## 2011-12-09 DIAGNOSIS — J45909 Unspecified asthma, uncomplicated: Secondary | ICD-10-CM | POA: Diagnosis not present

## 2011-12-09 LAB — GLUCOSE, CAPILLARY
Glucose-Capillary: 126 mg/dL — ABNORMAL HIGH (ref 70–99)
Glucose-Capillary: 146 mg/dL — ABNORMAL HIGH (ref 70–99)
Glucose-Capillary: 158 mg/dL — ABNORMAL HIGH (ref 70–99)
Glucose-Capillary: 94 mg/dL (ref 70–99)

## 2011-12-09 LAB — CBC
HCT: 35.5 % — ABNORMAL LOW (ref 36.0–46.0)
Hemoglobin: 11 g/dL — ABNORMAL LOW (ref 12.0–15.0)
MCH: 28.1 pg (ref 26.0–34.0)
MCHC: 31 g/dL (ref 30.0–36.0)
MCV: 90.6 fL (ref 78.0–100.0)
Platelets: 313 10*3/uL (ref 150–400)
RBC: 3.92 MIL/uL (ref 3.87–5.11)
RDW: 15 % (ref 11.5–15.5)
WBC: 7.2 10*3/uL (ref 4.0–10.5)

## 2011-12-09 LAB — PROTIME-INR
INR: 1.86 — ABNORMAL HIGH (ref 0.00–1.49)
Prothrombin Time: 21.8 seconds — ABNORMAL HIGH (ref 11.6–15.2)

## 2011-12-09 LAB — HEPARIN LEVEL (UNFRACTIONATED): Heparin Unfractionated: 0.28 IU/mL — ABNORMAL LOW (ref 0.30–0.70)

## 2011-12-09 MED ORDER — DM-GUAIFENESIN ER 30-600 MG PO TB12
1.0000 | ORAL_TABLET | Freq: Two times a day (BID) | ORAL | Status: DC | PRN
  Administered 2011-12-09 – 2011-12-11 (×3): 1 via ORAL
  Filled 2011-12-09 (×3): qty 1

## 2011-12-09 MED ORDER — WARFARIN SODIUM 5 MG PO TABS
5.0000 mg | ORAL_TABLET | Freq: Once | ORAL | Status: AC
  Administered 2011-12-09: 5 mg via ORAL
  Filled 2011-12-09: qty 1

## 2011-12-09 NOTE — Progress Notes (Signed)
ANTICOAGULATION CONSULT NOTE - Follow Up Consult  Pharmacy Consult for Heparin/Coumadin Indication: pulmonary embolus  Allergies  Allergen Reactions  . Bee Anaphylaxis  . Etodolac Nausea And Vomiting  . Morphine Other (See Comments)    Hallucinations  . Propoxyphene N-Acetaminophen Nausea And Vomiting  . Penicillins Rash   Patient Measurements: Height: 5\' 6"  (167.6 cm) Weight: 232 lb 5.8 oz (105.4 kg) IBW/kg (Calculated) : 59.3   Vital Signs: Temp: 97.8 F (36.6 C) (03/24 0530) Temp src: Axillary (03/24 0530) BP: 132/68 mmHg (03/24 0530) Pulse Rate: 86  (03/24 0530)  Labs:  Basename 12/09/11 0614 12/08/11 1559 12/08/11 0830 12/08/11 0603 12/07/11 0800 12/06/11 1547  HGB 11.0* -- -- 10.7* -- --  HCT 35.5* -- -- 34.5* -- --  PLT 313 -- -- 423* -- --  APTT -- -- -- -- -- 37  LABPROT 21.8* -- -- 14.7 -- 14.6  INR 1.86* -- -- 1.13 -- 1.12  HEPARINUNFRC 0.28* 0.46 0.88* -- -- --  CREATININE -- -- -- 0.56 0.57 --  CKTOTAL -- -- -- -- -- --  CKMB -- -- -- -- -- --  TROPONINI -- -- -- -- -- --   Estimated Creatinine Clearance: 84.8 ml/min (by C-G formula based on Cr of 0.56).  Assessment:  [name] s/p lap appendectomy on 3/12.  Developed acute bilateral PE.  Heparin/Coumadin overlap day #3 of 5 minimum per guidelines.  Heparin level below target range.  INR now rising quickly after 7.5mg  x 2. No bleeding reported/documented.  Goal of Therapy:  Heparin level 0.3-0.7 INR 2-3   Plan:   Increase heparin just slightly to 1600 units/hr.  Will wait to recheck heparin level until morning since she is a difficult stick with bruising on her arms, only a small heparin increase, and INR close to therapeutic.   Coumadin 5mg  today, to prevent overshooting of goal.   Charolotte Eke, PharmD, pager [phone]. 12/09/2011,8:24 AM.

## 2011-12-09 NOTE — Progress Notes (Addendum)
[facility] Inpatient Progress Note  12/09/2011  Subjective: Pt reports that she had been coughing last night but a lot better this morning. She is also having some wheezing.  She plans to ambulate today.  PT is close to therapeutic today.   Objective:  Vital signs in last 24 hours: Filed Vitals:   12/08/11 1657 12/08/11 1945 12/08/11 2304 12/09/11 0530  BP:   117/71 132/68  Pulse:   113 86  Temp:   98.4 F (36.9 C) 97.8 F (36.6 C)  TempSrc:   Oral Axillary  Resp:    17  Height:      Weight:      SpO2: 90% 93% 94% 95%   Weight change:   Intake/Output Summary (Last 24 hours) at 12/09/11 1059 Last data filed at 12/09/11 0546  Gross per 24 hour  Intake 670.08 ml  Output   3900 ml  Net -3229.92 ml   Lab Results  Component Value Date   HGBA1C 6.7* 11/28/2011   Lab Results  Component Value Date   CREATININE 0.56 12/08/2011    Review of Systems As above, otherwise all reviewed and reported negative  Physical Exam General: Patient awake, alert, no distress, calm cooperative  Cardiovascular: normal s1, s2 sounds  Lungs: BBS with rare exp wheezes heard post bases bilateral Abdomen: Positive bowel sounds, wounds clean dry. Nontender, no masses palpated Extremities: scds bilateral  Neuro - awake and oriented x 4.   Lab Results: Results for orders placed during the hospital encounter of 11/27/11 (from the past 24 hour(s))  GLUCOSE, CAPILLARY     Status: Abnormal   Collection Time   12/08/11 12:06 PM      Component Value Range   Glucose-Capillary 136 (*) 70 - 99 (mg/dL)   Comment 1 Notify RN     Comment 2 Documented in Chart    HEPARIN LEVEL (UNFRACTIONATED)     Status: Normal   Collection Time   12/08/11  3:59 PM      Component Value Range   Heparin Unfractionated 0.46  0.30 - 0.70 (IU/mL)  GLUCOSE, CAPILLARY     Status: Normal   Collection Time   12/08/11  5:06 PM      Component Value Range   Glucose-Capillary 86  70 - 99 (mg/dL)   Comment 1 Notify RN       Comment 2 Documented in Chart    GLUCOSE, CAPILLARY     Status: Abnormal   Collection Time   12/09/11 12:07 AM      Component Value Range   Glucose-Capillary 126 (*) 70 - 99 (mg/dL)  CBC     Status: Abnormal   Collection Time   12/09/11  6:14 AM      Component Value Range   WBC 7.2  4.0 - 10.5 (K/uL)   RBC 3.92  3.87 - 5.11 (MIL/uL)   Hemoglobin 11.0 (*) 12.0 - 15.0 (g/dL)   HCT 29.5 (*) 62.1 - 46.0 (%)   MCV 90.6  78.0 - 100.0 (fL)   MCH 28.1  26.0 - 34.0 (pg)   MCHC 31.0  30.0 - 36.0 (g/dL)   RDW 30.8  65.7 - 84.6 (%)   Platelets 313  150 - 400 (K/uL)  PROTIME-INR     Status: Abnormal   Collection Time   12/09/11  6:14 AM      Component Value Range   Prothrombin Time 21.8 (*) 11.6 - 15.2 (seconds)   INR 1.86 (*) 0.00 - 1.49  HEPARIN LEVEL (UNFRACTIONATED)     Status: Abnormal   Collection Time   12/09/11  6:14 AM      Component Value Range   Heparin Unfractionated 0.28 (*) 0.30 - 0.70 (IU/mL)  GLUCOSE, CAPILLARY     Status: Normal   Collection Time   12/09/11  6:45 AM      Component Value Range   Glucose-Capillary 94  70 - 99 (mg/dL)   Comment 1 Notify RN      Micro Results: No results found for this or any previous visit (from the past 240 hour(s)).  Medications:  Scheduled Meds:   . albuterol  2.5 mg Nebulization QID  . antiseptic oral rinse  15 mL Mouth Rinse q12n4p  . chlorhexidine  15 mL Mouth Rinse BID  . clonazePAM  0.5 mg Oral QHS  . cyclobenzaprine  5 mg Oral QHS  . DULoxetine  60 mg Oral Daily  . gabapentin  800 mg Oral BID  . insulin aspart  0-20 Units Subcutaneous Q6H  . ipratropium  0.5 mg Nebulization QID  . levothyroxine  25 mcg Oral QAC breakfast  . lidocaine   Other TID  . magnesium hydroxide  30 mL Oral Daily  . mometasone-formoterol  2 puff Inhalation BID  . oxyCODONE-acetaminophen  1 tablet Oral BID   And  . oxyCODONE  5 mg Oral BID  . pantoprazole  40 mg Oral Q1200  . saccharomyces boulardii  250 mg Oral BID  . vitamin A & D      .  warfarin  5 mg Oral ONCE-1800  . warfarin  7.5 mg Oral ONCE-1800  . Warfarin - Pharmacist Dosing Inpatient   Does not apply q1800   Continuous Infusions:   . heparin 1,600 Units/hr (12/09/11 1005)   PRN Meds:.albuterol, ALPRAZolam, guaiFENesin, ipratropium, ondansetron, ondansetron, promethazine, promethazine-codeine, sodium chloride, SUMAtriptan, DISCONTD: benzonatate  Assessment/Plan:  Pulmonary embolism (12/06/2011)  Patient started on heparin and Coumadin per surgery. Chest x-ray repeated negative for pneumonia. Pt on heparin gtt, coumadin nearly therapeutic. DC heparin when INR >2.0. Follow up PT/INR test with PCP 1-2 days post discharge.   Appendicitis, acute, with peritonitis (11/27/2011)  Status post appendectomy. Managed by general surgery team.  Plan ambulating well, encouraged pt to use incentive spirometry today  Pt showing improvement overall.   Hypokalemia - corrected  COUGH - pt is wheezing and it is triggering her to cough, continue scheduled nebs for now, Use IS for her atelectasis. Pt has declined to see a pulmonologist as she does not want to work with any physicians associated with [employer].   Recommend she is discharged with an albuterol inhaler and follows up with her PCP Dr. Ricki Miller to discuss referral to an acceptable pulmonologist at discharge.  Pt reports that the cough has been a chronic problem for her.     DIABETES MELLITUS, TYPE II (04/27/2010)  Pt does not believe that she has diabetes.  She will discuss further with her PCP Dr. Ricki Miller at discharge.  Blood sugars controlled with supplemental insulin. Pt should follow up with her PCP Dr. Ricki Miller to discuss diet control methods.  A1c - 6.7%.    MIGRAINE HEADACHE (04/27/2010)  Stable, sumatriptan prn.   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  WILL SIGN OFF AT THIS TIME.  RECOMMENDATIONS ABOVE.  CLOSE FOLLOW UP WITH PCP DR. PANG IS RECOMMENDED. PLEASE SEND  HIM COPY OF DC SUMMARY.  PLEASE CALL IF  [facility] CAN BE OF ANY FURTHER ASSISTANCE.  THANKS FOR CONSULT.     LOS: 12 days   Meryl Ponder 12/09/2011, 10:59 AM   Cleora Fleet, MD, CDE, FAAFP [facility] [place], [place]  [phone]

## 2011-12-09 NOTE — Progress Notes (Signed)
12 Days Post-Op  Subjective: Had a BM yesterday.  Still with some cough. Medicine for cough started yesterday.  Objective: Vital signs in last 24 hours: Temp:  [97.5 F (36.4 C)-98.4 F (36.9 C)] 97.8 F (36.6 C) (03/24 0530) Pulse Rate:  [86-113] 86  (03/24 0530) Resp:  [16-17] 17  (03/24 0530) BP: (117-133)/(68-77) 132/68 mmHg (03/24 0530) SpO2:  [90 %-95 %] 95 % (03/24 0530) Last BM Date: 12/06/11  Intake/Output from previous day: 03/23 0701 - 03/24 0700 In: 910.1 [P.O.:720; I.V.:190.1] Out: 3900 [Urine:3900] Intake/Output this shift: Total I/O In: -  Out: 1700 [Urine:1700]  PE:  Abd-soft, incisions clean, dry, and intact  Lab Results:   [facility] 12/09/11 0614 12/08/11 0603  WBC 7.2 7.3  HGB 11.0* 10.7*  HCT 35.5* 34.5*  PLT 313 423*   BMET  Basename 12/08/11 0603 12/07/11 0800  NA 138 138  K 4.0 4.0  CL 102 102  CO2 28 33*  GLUCOSE 109* 103*  BUN 3* <3*  CREATININE 0.56 0.57  CALCIUM 8.8 8.6   PT/INR  Basename 12/08/11 0603 12/06/11 1547  LABPROT 14.7 14.6  INR 1.13 1.12   Comprehensive Metabolic Panel:    Component Value Date/Time   NA 138 12/08/2011 0603   K 4.0 12/08/2011 0603   CL 102 12/08/2011 0603   CO2 28 12/08/2011 0603   BUN 3* 12/08/2011 0603   CREATININE 0.56 12/08/2011 0603   GLUCOSE 109* 12/08/2011 0603   CALCIUM 8.8 12/08/2011 0603   AST 11 12/08/2011 0603   ALT 6 12/08/2011 0603   ALKPHOS 53 12/08/2011 0603   BILITOT 0.2* 12/08/2011 0603   PROT 6.1 12/08/2011 0603   ALBUMIN 2.6* 12/08/2011 0603     Studies/Results: Dg Chest 2 View  12/08/2011  *RADIOLOGY REPORT*  Clinical Data: Follow up of "infiltrates", cough.  Pulmonary embolism.  CHEST - 2 VIEW  Comparison: CT of 12/06/2011.  Plain film of 12/05/2011.  Findings: Left-sided PICC line which terminates the mid SVC. Midline trachea.  Cardiomegaly accentuated by AP portable technique.  No pleural effusion or pneumothorax.  Low lung volumes with patchy bibasilar atelectasis.   IMPRESSION:  1.  No evidence of "infiltrates." 2.  Low lung volumes with bibasilar atelectasis and cardiomegaly.  Original Report Authenticated By: Consuello Bossier, M.D.    Anti-infectives: Anti-infectives     Start     Dose/Rate Route Frequency Ordered Stop   12/06/11 1400   vancomycin (VANCOCIN) IVPB 1000 mg/200 mL premix  Status:  Discontinued        1,000 mg 200 mL/hr over 60 Minutes Intravenous Every 12 hours 12/06/11 1112 12/06/11 1856   12/06/11 1200   aztreonam (AZACTAM) 2 g in dextrose 5 % 50 mL IVPB  Status:  Discontinued        2 g 100 mL/hr over 30 Minutes Intravenous 3 times per day 12/06/11 1112 12/06/11 1856   12/06/11 1200   Levofloxacin (LEVAQUIN) IVPB 750 mg  Status:  Discontinued        750 mg 100 mL/hr over 90 Minutes Intravenous Every 24 hours 12/06/11 1112 12/06/11 1911   11/28/11 2130   ertapenem (INVANZ) 1 g in sodium chloride 0.9 % 50 mL IVPB        1 g 100 mL/hr over 30 Minutes Intravenous Every 24 hours 11/28/11 0201 12/05/11 2129   11/27/11 2130   ertapenem (INVANZ) 1 g in sodium chloride 0.9 % 50 mL IVPB  Status:  Discontinued  1 g 100 mL/hr over 30 Minutes Intravenous Every 24 hours 11/27/11 2104 11/28/11 0201          Assessment Principal Problem:  *Appendicitis, acute, with peritonitis and postop ileus that has resolved.  Active Problems:  DIABETES MELLITUS, TYPE II  MIGRAINE HEADACHE  IBS  DYSPNEA  Spasmodic dysphonia  Pulmonary embolism, acute-on IV Heparin, Coumadin started INR pending this AM.  Thrombophlebitis  Chronic cough   LOS: 12 days   Plan: Wait for INR to be therapeutic and then can d/c heparin and discharge.   Hayley [name] 12/09/2011

## 2011-12-10 DIAGNOSIS — J45909 Unspecified asthma, uncomplicated: Secondary | ICD-10-CM | POA: Diagnosis not present

## 2011-12-10 DIAGNOSIS — G4730 Sleep apnea, unspecified: Secondary | ICD-10-CM | POA: Diagnosis not present

## 2011-12-10 DIAGNOSIS — I10 Essential (primary) hypertension: Secondary | ICD-10-CM | POA: Diagnosis not present

## 2011-12-10 DIAGNOSIS — I2699 Other pulmonary embolism without acute cor pulmonale: Secondary | ICD-10-CM | POA: Diagnosis not present

## 2011-12-10 DIAGNOSIS — R0902 Hypoxemia: Secondary | ICD-10-CM | POA: Diagnosis not present

## 2011-12-10 DIAGNOSIS — E119 Type 2 diabetes mellitus without complications: Secondary | ICD-10-CM | POA: Diagnosis not present

## 2011-12-10 DIAGNOSIS — G4710 Hypersomnia, unspecified: Secondary | ICD-10-CM | POA: Diagnosis not present

## 2011-12-10 LAB — CBC
HCT: 35.9 % — ABNORMAL LOW (ref 36.0–46.0)
Hemoglobin: 10.7 g/dL — ABNORMAL LOW (ref 12.0–15.0)
MCH: 27 pg (ref 26.0–34.0)
MCHC: 29.8 g/dL — ABNORMAL LOW (ref 30.0–36.0)
MCV: 90.7 fL (ref 78.0–100.0)
Platelets: 385 10*3/uL (ref 150–400)
RBC: 3.96 MIL/uL (ref 3.87–5.11)
RDW: 14.8 % (ref 11.5–15.5)
WBC: 6.2 10*3/uL (ref 4.0–10.5)

## 2011-12-10 LAB — GLUCOSE, CAPILLARY
Glucose-Capillary: 100 mg/dL — ABNORMAL HIGH (ref 70–99)
Glucose-Capillary: 121 mg/dL — ABNORMAL HIGH (ref 70–99)
Glucose-Capillary: 125 mg/dL — ABNORMAL HIGH (ref 70–99)
Glucose-Capillary: 138 mg/dL — ABNORMAL HIGH (ref 70–99)
Glucose-Capillary: 158 mg/dL — ABNORMAL HIGH (ref 70–99)

## 2011-12-10 LAB — URINALYSIS, ROUTINE W REFLEX MICROSCOPIC
Bilirubin Urine: NEGATIVE
Glucose, UA: NEGATIVE mg/dL
Hgb urine dipstick: NEGATIVE
Ketones, ur: NEGATIVE mg/dL
Leukocytes, UA: NEGATIVE
Nitrite: NEGATIVE
Protein, ur: NEGATIVE mg/dL
Specific Gravity, Urine: 1.011 (ref 1.005–1.030)
Urobilinogen, UA: 0.2 mg/dL (ref 0.0–1.0)
pH: 8 (ref 5.0–8.0)

## 2011-12-10 LAB — PROTIME-INR
INR: 2.59 — ABNORMAL HIGH (ref 0.00–1.49)
Prothrombin Time: 28.2 seconds — ABNORMAL HIGH (ref 11.6–15.2)

## 2011-12-10 LAB — HEPARIN LEVEL (UNFRACTIONATED): Heparin Unfractionated: 0.3 IU/mL (ref 0.30–0.70)

## 2011-12-10 MED ORDER — IPRATROPIUM BROMIDE 0.02 % IN SOLN
0.5000 mg | Freq: Four times a day (QID) | RESPIRATORY_TRACT | Status: DC
  Administered 2011-12-10 – 2011-12-11 (×5): 0.5 mg via RESPIRATORY_TRACT
  Filled 2011-12-10 (×5): qty 2.5

## 2011-12-10 MED ORDER — FLUCONAZOLE 100 MG PO TABS
100.0000 mg | ORAL_TABLET | Freq: Once | ORAL | Status: AC
  Administered 2011-12-10: 100 mg via ORAL
  Filled 2011-12-10: qty 1

## 2011-12-10 MED ORDER — ALBUTEROL SULFATE (5 MG/ML) 0.5% IN NEBU
2.5000 mg | INHALATION_SOLUTION | Freq: Three times a day (TID) | RESPIRATORY_TRACT | Status: DC
  Administered 2011-12-10: 2.5 mg via RESPIRATORY_TRACT
  Filled 2011-12-10: qty 0.5

## 2011-12-10 MED ORDER — ALBUTEROL SULFATE (5 MG/ML) 0.5% IN NEBU
2.5000 mg | INHALATION_SOLUTION | Freq: Four times a day (QID) | RESPIRATORY_TRACT | Status: DC
  Administered 2011-12-10 – 2011-12-11 (×5): 2.5 mg via RESPIRATORY_TRACT
  Filled 2011-12-10 (×5): qty 0.5

## 2011-12-10 MED ORDER — IPRATROPIUM BROMIDE 0.02 % IN SOLN
0.5000 mg | Freq: Three times a day (TID) | RESPIRATORY_TRACT | Status: DC
  Administered 2011-12-10: 0.5 mg via RESPIRATORY_TRACT
  Filled 2011-12-10: qty 2.5

## 2011-12-10 NOTE — Discharge Summary (Signed)
Physician Discharge Summary  Patient ID: Hayley [name] MRN: [id number] DOB/AGE: [birth year] 66 y.o.  Admit date: 11/27/2011 Discharge date: 12/11/2011  Primary Care: Dr. Juline Patch  Consults:  Sandrea Hughs - Pulmonary Dr. Standley Dakins  - Internal Medicine  Admission Diagnoses: Probable Appendicitis OSA with Cpap .  DIABETES MELLITUS, TYPE II  .  Obesity, Class III, BMI 40-49.9 (morbid obesity)  .  ANEMIA  .  ANXIETY DEPRESSION  .  DEPRESSION  .  MIGRAINE HEADACHE  .  NEUROPATHY  .  HEMORRHOIDS  .  GERD  .  DIVERTICULOSIS, COLON  .  IBS  .  CHOLELITHIASIS  .  RENAL CALCULUS  .  DIAPHORESIS  .  DYSPNEA  .  HYPOXEMIA  .  Appendicitis, acute, with peritonitis  .  Spasmodic dysphonia   Discharge Diagnoses: Acute appendicitis with phlegmon, probable perforated  Post op Hypoxia, multifactorial PE  With ongoing respiratory complaints and atypical Chest pain; place on Coumadin.  Post-op ileus: improving.   Pelvic pain: Etiology unclear ?neuropathy.   S/P appendectomy for acute appendicitis with peritonitis.   GERD: On Pantoprazole.  Diverticulosis.   Fatty liver.   Sleep apnea: on C-PAP. Multiple Medical Issues as listed on Admission Morbid obesity Anxiety and depression  PROCEDURES: LAPAROSCOPIC APPENDECTOMY  11/27/11 Dr. Karie Soda   CT Chest 12/06/11:   Bilateral segmental/subsegmental pulmonary emboli. Overall clot burden is small to moderate.     Hospital Course: Patient is a deformity obese female with numerous health issues. She began to have abdominal pain yesterday. She her husband saw Dr. Loreta Ave, her gastroenterologist, this morning. There was concern. She was sent to the emergency room. She's been in the ER all day. There has been difficulty with IV access. The pain has gradually migrated to the right lower side. She has decreased appetite. She has nausea. Question of vomiting.  No sick contacts. No travel history. Not really had pain like  this before. She has reflux for which Dr. Loreta Ave helps monitor and is stable. This does not feel like that. Normally has a bowel movement every day. No inflammatory bowel disease. Some irritable bowel symptoms but not severe recently. Colonoscopies last year. She saw Dr. Marchelle Gearing for some dyspnea on exertion. Question of COPD. I think she is supposed to be oxygen dependent but has not been doing that.  Based on concerns she had a CAT scan. It showed appendicitis. Dr. Earlie Lou with the [facility] long emergency department requested surgical evaluation.  Pt evaluated by Dr.Gross and taken to OR.  Post op she had a good deal of Hypoxic complaints and was seen and followed initially by CCM. She was suppose to go back on her [facility], she eventually improved and was transferred to the floor.  She complained of increased wheezing and atypical chest discomfort. She was seen by Medicine and CT confirmed a PE as described above.  She had a post op ileus, which has resolved.  Her coumadin is now theraputic.  She continues to have some complaints of DOE, and wheezing.  On going cough which is non productive. She was evaluated by DR. Johnson who also discussed her with DR. Pang.  It is his opinion she is stable for D/c. We checked her O2 Sats with ambulation and they ranged from 85-91% on 2L nasal O2.  We are going to arrange for Home O2.  Pt now reports she was on this in the past, but not for some time.    Condition on D/C:  Improving  Disposition:    Medication List  As of 12/11/2011  2:50 PM   STOP taking these medications         topiramate 100 MG tablet         TAKE these medications         albuterol 108 (90 BASE) MCG/ACT inhaler   Commonly known as: PROVENTIL HFA;VENTOLIN HFA   Inhale 2 puffs into the lungs every 6 (six) hours as needed for wheezing or shortness of breath.      aspirin-sod bicarb-citric acid 325 MG Tbef   Commonly known as: ALKA-SELTZER   Take 325 mg by mouth every 6 (six) hours as  needed.      clonazePAM 0.5 MG tablet   Commonly known as: KLONOPIN   0.5 mg daily as needed. For agitation or anxiety      cyclobenzaprine 10 MG tablet   Commonly known as: FLEXERIL   Take 10 mg by mouth at bedtime.      diclofenac 1.3 % Ptch   Commonly known as: FLECTOR   Place 1 patch onto the skin 2 (two) times daily. Apply to back      DULERA 100-5 MCG/ACT Aero   Generic drug: mometasone-formoterol   Inhale 2 puffs into the lungs 2 (two) times daily.      DULoxetine 60 MG capsule   Commonly known as: CYMBALTA   Take 60 mg by mouth daily.      EPIPEN 0.3 mg/0.3 mL Devi   Generic drug: EPINEPHrine   Inject 0.3 mg into the muscle once.      gabapentin 800 MG tablet   Commonly known as: NEURONTIN   Take 800 mg by mouth 2 (two) times daily.      levothyroxine 25 MCG tablet   Commonly known as: SYNTHROID, LEVOTHROID   Take 25 mcg by mouth daily.      metoprolol succinate 50 MG 24 hr tablet   Commonly known as: TOPROL-XL   Take 50 mg by mouth every other day.      oxyCODONE-acetaminophen 10-325 MG per tablet   Commonly known as: PERCOCET   Take 1 tablet by mouth 2 (two) times daily.      oxyCODONE-acetaminophen 5-325 MG per tablet   Commonly known as: PERCOCET   Take 1-2 tablets by mouth every 4 (four) hours as needed for pain.      promethazine 25 MG tablet   Commonly known as: PHENERGAN   Take 25 mg by mouth every 8 (eight) hours as needed. For nausea      saccharomyces boulardii 250 MG capsule   Commonly known as: FLORASTOR   Take 1 capsule (250 mg total) by mouth 2 (two) times daily.      SUMAtriptan 6 MG/0.5ML Soln injection   Commonly known as: IMITREX   Inject 6 mg into the skin every 2 (two) hours as needed.      warfarin 5 MG tablet   Commonly known as: COUMADIN   Take 1 tablet (5 mg total) by mouth daily.           Follow-up Information    Follow up with GROSS,STEVEN C., MD in 2 weeks.   Contact information:   [employer]  [place] [address] [place] [address] [phone]       Follow up with Juline Patch, MD in 2 days. (Check PT/INR and follow up cough and wheezing)          Signed: Sherrie George 12/11/2011,  2:50 PM

## 2011-12-10 NOTE — Progress Notes (Signed)
13 Days Post-Op  Subjective: INR 2.59 Multiple complaints, mostly related to her breathing. Still wheezing dry and productive cough, wheezing.  She feels light headed when up, feels weak, and doesn't feel like she's emptying her bladder when she voids. Objective: Vital signs in last 24 hours: Temp:  [97.4 F (36.3 C)-99.3 F (37.4 C)] 97.4 F (36.3 C) (03/25 0504) Pulse Rate:  [88-107] 88  (03/25 0504) Resp:  [18] 18  (03/25 0504) BP: (125-134)/(76-78) 134/78 mmHg (03/25 0504) SpO2:  [80 %-93 %] 93 % (03/25 1018) Last BM Date: 12/09/11 Afebrile, VSS, HR is better,  Intake/Output from previous day: 03/24 0701 - 03/25 0700 In: 1085 [P.O.:720; I.V.:365] Out: 1600 [Urine:1600] Intake/Output this shift: Total I/O In: -  Out: 800 [Urine:800]  General appearance: alert, cooperative and no distress Resp: clear to auscultation bilaterally and some decreased sounds in bases, some wheezing on and off. GI: C/o pain mid lower trochar site, but eating and having BM without difficulty. Ileus appears to be resoleved.  Incisions look good  Lab Results:   Basename 12/10/11 0506 12/09/11 0614  WBC 6.2 7.2  HGB 10.7* 11.0*  HCT 35.9* 35.5*  PLT 385 313    BMET  Basename 12/08/11 0603  NA 138  K 4.0  CL 102  CO2 28  GLUCOSE 109*  BUN 3*  CREATININE 0.56  CALCIUM 8.8   PT/INR  Basename 12/10/11 0506 12/09/11 0614  LABPROT 28.2* 21.8*  INR 2.59* 1.86*     Lab 12/08/11 0603 12/06/11 0505 12/05/11 1920  AST 11 12 15   ALT 6 6 6   ALKPHOS 53 48 52  BILITOT 0.2* 0.2* 0.2*  PROT 6.1 6.0 6.7  ALBUMIN 2.6* 2.4* 2.6*     Lipase     Component Value Date/Time   LIPASE 35 11/27/2011 1331     Studies/Results: Dg Chest 2 View  12/08/2011  *RADIOLOGY REPORT*  Clinical Data: Follow up of "infiltrates", cough.  Pulmonary embolism.  CHEST - 2 VIEW  Comparison: CT of 12/06/2011.  Plain film of 12/05/2011.  Findings: Left-sided PICC line which terminates the mid SVC. Midline trachea.   Cardiomegaly accentuated by AP portable technique.  No pleural effusion or pneumothorax.  Low lung volumes with patchy bibasilar atelectasis.  IMPRESSION:  1.  No evidence of "infiltrates." 2.  Low lung volumes with bibasilar atelectasis and cardiomegaly.  Original Report Authenticated By: Consuello Bossier, M.D.    Medications:    . albuterol  2.5 mg Nebulization QID  . antiseptic oral rinse  15 mL Mouth Rinse q12n4p  . chlorhexidine  15 mL Mouth Rinse BID  . clonazePAM  0.5 mg Oral QHS  . cyclobenzaprine  5 mg Oral QHS  . DULoxetine  60 mg Oral Daily  . gabapentin  800 mg Oral BID  . insulin aspart  0-20 Units Subcutaneous Q6H  . ipratropium  0.5 mg Nebulization QID  . levothyroxine  25 mcg Oral QAC breakfast  . lidocaine   Other TID  . magnesium hydroxide  30 mL Oral Daily  . mometasone-formoterol  2 puff Inhalation BID  . oxyCODONE-acetaminophen  1 tablet Oral BID   And  . oxyCODONE  5 mg Oral BID  . pantoprazole  40 mg Oral Q1200  . saccharomyces boulardii  250 mg Oral BID  . warfarin  5 mg Oral ONCE-1800  . Warfarin - Pharmacist Dosing Inpatient   Does not apply q1800    Assessment/Plan *Appendicitis, acute, with peritonitis and postop ileus that has resolved.  Active Problems:  DIABETES MELLITUS, TYPE II  MIGRAINE HEADACHE  IBS  DYSPNEA  Spasmodic dysphonia  Pulmonary embolism, acute-on IV Heparin, Coumadin started INR pending this AM.  Thrombophlebitis  Chronic cough  Acute appendicitis with phlegmon, probable perforated s/APPENDECTOMY LAPAROSCOPIC 11/27/11. DR. Michaell Cowing  1) Post-op ileus: improving.  2) Pelvic pain: Etiology unclear ?neuropathy.  3) S/P appendectomy for acute appendicitis with peritonitis.  4) GERD: On Pantoprazole.  5) Diverticulosis.  6) Fatty liver.  7) Sleep apnea: on C-PAP. 8)  BMI 37.3  Plan:  I will check residual after voiding, recheck urine.;  Defer all her pulmonary complaints to medicine, and follow their recommendations. From a surgical  standpoint she's ready for discharge after cleared by Medicine. It sounds like she has seen Pulmonary in the past, but I'm not sure.  Will let Dr. Laural Benes decide if they should see her again.  They did help with her care post op after surgery Primary Care is DR. Juline Patch.  We will ask for him to follow coumadin.      LOS: 13 days    Hayley [name] 12/10/2011

## 2011-12-10 NOTE — Progress Notes (Signed)
PT/OT/ST Cancellation Note  ___Treatment cancelled today due to medical issues with patient which prohibited therapy  ___ Treatment cancelled today due to patient receiving procedure or test   __x_ Treatment cancelled today due to patient's refusal to participate. Pt stated she couldn't participate due to her "having a hard time with breathing today." Pt stated she would walk with husband/nursing later today. Encouraged pt to ambulate in hallway vs just in room.  ___ Treatment cancelled today due to   Signature: Rebeca Alert, PT [phone]

## 2011-12-10 NOTE — Progress Notes (Signed)
Walked into patient's room and her face was red and she stated she didn't "feel that great".  Patient did not have oxygen on at this time.  Checked her oxygen saturation which was 80% on RA.  Applied oxygen at 2 L/M and patient's oxygen saturation increased to 88%.  After increasing to 3 L/M and instructing patient to breathe through her nose, her oxygen saturation increased to 93/94%.  Will continue to monitor oxygen saturation and spot check throughout the day.  Encouraged patient to use incentive spirometer every hour while awake approximately 10 times if able.  Patient agreed. Nyaire Denbleyker, Joslyn Devon

## 2011-12-10 NOTE — Progress Notes (Signed)
ANTICOAGULATION CONSULT NOTE - Follow Up Consult  Pharmacy Consult for Heparin/Coumadin Indication: pulmonary embolus  Allergies  Allergen Reactions  . Bee Anaphylaxis  . Etodolac Nausea And Vomiting  . Morphine Other (See Comments)    Hallucinations  . Propoxyphene N-Acetaminophen Nausea And Vomiting  . Penicillins Rash   Patient Measurements: Height: 5\' 6"  (167.6 cm) Weight: 232 lb 5.8 oz (105.4 kg) IBW/kg (Calculated) : 59.3   Vital Signs: Temp: 97.4 F (36.3 C) (03/25 0504) Temp src: Oral (03/25 0504) BP: 134/78 mmHg (03/25 0504) Pulse Rate: 88  (03/25 0504)  Labs:  Basename 12/10/11 0506 12/09/11 0614 12/08/11 1559 12/08/11 0603  HGB 10.7* 11.0* -- --  HCT 35.9* 35.5* -- 34.5*  PLT 385 313 -- 423*  APTT -- -- -- --  LABPROT 28.2* 21.8* -- 14.7  INR 2.59* 1.86* -- 1.13  HEPARINUNFRC 0.30 0.28* 0.46 --  CREATININE -- -- -- 0.56  CKTOTAL -- -- -- --  CKMB -- -- -- --  TROPONINI -- -- -- --   Estimated Creatinine Clearance: 84.8 ml/min (by C-G formula based on Cr of 0.56).  Assessment:  [name] s/p lap appendectomy on 3/12 and developed acute bilateral PE. Neg for DVT. Coumadin score =4.  Coumadin education completed by pharmacy.    Today is Day 4 of Coumadin/heparin overlap. CBC stable.  No bleeding reported/documented. HL therapeutic, INR 2.59 but with rapid rise last 2 days.   Per CHEST guidelines, should continue coumadin/heparin bridge for minimum of 5 days AND INR > 2 for 24 hours if able.    Expecting further rise in INR, will hold coumadin tonight.    Goal of Therapy:  Heparin level 0.3-0.7 INR 2-3   Plan:   Continue IV heparin at 1600 units/hr.  No coumadin dose tonight  If pt is to be discharge for any reason, recommend Coumadin 5 mg starting 3/26 and PT/INR 1-2 days after discharge.   Geoffry Paradise, PharmD.   Pager:  [phone] 9:09 AM

## 2011-12-10 NOTE — Discharge Instructions (Signed)
LAPAROSCOPIC SURGERY: POST OP INSTRUCTIONS  1. DIET: Follow a light bland diet the first 24 hours after arrival home, such as soup, liquids, crackers, etc.  Be sure to include lots of fluids daily.  Avoid fast food or heavy meals as your are more likely to get nauseated.  Eat a low fat the next few days after surgery.   2. Take your usually prescribed home medications unless otherwise directed. 3. PAIN CONTROL: a. Pain is best controlled by a usual combination of three different methods TOGETHER: i. Ice/Heat ii. Over the counter pain medication iii. Prescription pain medication b. Most patients will experience some swelling and bruising around the incisions.  Ice packs or heating pads (30-60 minutes up to 6 times a day) will help. Use ice for the first few days to help decrease swelling and bruising, then switch to heat to help relax tight/sore spots and speed recovery.  Some people prefer to use ice alone, heat alone, alternating between ice & heat.  Experiment to what works for you.  Swelling and bruising can take several weeks to resolve.   c. It is helpful to take an over-the-counter pain medication regularly for the first few weeks.  Choose one of the following that works best for you: i. Naproxen (Aleve, etc)  Two  tabs twice a day ii. Ibuprofen (Advil, etc) Three  tabs four times a day (every meal & bedtime) iii. Acetaminophen (Tylenol, etc) 500-650mg  four times a day (every meal & bedtime) d. A  prescription for pain medication (such as oxycodone, hydrocodone, etc) should be given to you upon discharge.  Take your pain medication as prescribed.  i. If you are having problems/concerns with the prescription medicine (does not control pain, nausea, vomiting, rash, itching, etc), please call us [phone] to see if we need to switch you to a different pain medicine that will work better for you and/or control your side effect better. ii. If you need a refill on your pain medication,  please contact your pharmacy.  They will contact our office to request authorization. Prescriptions will not be filled after 5 pm or on week-ends. 4. Avoid getting constipated.  Between the surgery and the pain medications, it is common to experience some constipation.  Increasing fluid intake and taking a fiber supplement (such as Metamucil, Citrucel, FiberCon, MiraLax, etc) 1-2 times a day regularly will usually help prevent this problem from occurring.  A mild laxative (prune juice, Milk of Magnesia, MiraLax, etc) should be taken according to package directions if there are no bowel movements after 48 hours.   5. Watch out for diarrhea.  If you have many loose bowel movements, simplify your diet to bland foods & liquids for a few days.  Stop any stool softeners and decrease your fiber supplement.  Switching to mild anti-diarrheal medications (Kayopectate, Pepto Bismol) can help.  If this worsens or does not improve, please call us. 6. Wash / shower every day.  You may shower over the dressings as they are waterproof.  Continue to shower over incision(s) after the dressing is off. 7. Remove your waterproof bandages 5 days after surgery.  You may leave the incision open to air.  You may replace a dressing/Band-Aid to cover the incision for comfort if you wish.  8. ACTIVITIES as tolerated:   a. You may resume regular (light) daily activities beginning the next day--such as daily self-care, walking, climbing stairs--gradually increasing activities as tolerated.  If you can walk 30 minutes without difficulty, it  is safe to try more intense activity such as jogging, treadmill, bicycling, low-impact aerobics, swimming, etc. b. Save the most intensive and strenuous activity for last such as sit-ups, heavy lifting, contact sports, etc  Refrain from any heavy lifting or straining until you are off narcotics for pain control.   c. DO NOT PUSH THROUGH PAIN.  Let pain be your guide: If it hurts to do something, don't  do it.  Pain is your body warning you to avoid that activity for another week until the pain goes down. d. You may drive when you are no longer taking prescription pain medication, you can comfortably wear a seatbelt, and you can safely maneuver your car and apply brakes. e. Bonita Quin may have sexual intercourse when it is comfortable.  9. FOLLOW UP in our office a. Please call [facility] at [phone] to set up an appointment to see your surgeon in the office for a follow-up appointment approximately 2-3 weeks after your surgery. b. Make sure that you call for this appointment the day you arrive home to insure a convenient appointment time. 10. IF YOU HAVE DISABILITY OR FAMILY LEAVE FORMS, BRING THEM TO THE OFFICE FOR PROCESSING.  DO NOT GIVE THEM TO YOUR DOCTOR.   WHEN TO CALL us [phone]: 1. Poor pain control 2. Reactions / problems with new medications (rash/itching, nausea, etc)  3. Fever over 101.5 F (38.5 C) 4. Inability to urinate 5. Nausea and/or vomiting 6. Worsening swelling or bruising 7. Continued bleeding from incision. 8. Increased pain, redness, or drainage from the incision   The clinic staff is available to answer your questions during regular business hours (8:30am-5pm).  Please don't hesitate to call and ask to speak to one of our nurses for clinical concerns.   If you have a medical emergency, go to the nearest emergency room or call 911.  A surgeon from [facility] is always on call at the [facility], [place] [address], [address], [place], [place]  [address] ? MAIN: [phone] ? TOLL FREE: [phone] ?  FAX [phone] [email or web address]  Vitamin K and Warfarin Warfarin is a drug that helps thin your blood. If you take warfarin, you will need to follow a diet that has a consistent amount of vitamin K-containing foods. Sudden changes in the amount of vitamin K that you eat can cause the medicine to not  work as well as it should. You do not need to avoid vitamin K-containing foods. FOODS HIGH IN VITAMIN K  Broccoli, fresh or frozen, cooked, 1 cup   Greens, fresh or frozen, cooked (beet, collard, mustard, turnip),  cup   Kale, fresh or frozen, cooked,  cup   Parsley, raw, 10 sprigs   Spinach, frozen or canned, cooked,  cup  FOODS MODERATELY HIGH IN VITAMIN K  Bok choy, cooked, 1 cup   Broccoli, raw, 1 cup   Brussels sprouts, fresh or frozen, cooked,  cup   Cabbage, cooked, 1 cup   Endive, raw, 1 cup   Green leaf lettuce, raw, 1 cup   Green scallions, raw,  cup   Okra, frozen, cooked, 1 cup   Romaine lettuce, raw, 1 cup   Sauerkraut, canned, 1 cup   Spinach, raw, 1 cup  KEEPING YOUR INTAKE CONSISTENT  Note the foods that are high in vitamin K listed above. How many times per week do you eat these foods?   For example, you mighteat cooked broccoli 1 time  a week and a leafy green salad 3 times a week. In that case, you should have 1 high vitamin K food each week and 3 moderately high foods each week.   Remember, you do not need to eat the same foods each week. Try to keep your vitamin K intake levels about the same.  MORE TIPS  Take your warfarin as instructed.   It is okay to take a multivitamin that contains vitamin K. Just be sure to take it every day.   Avoid taking herbs unless approved by your caregiver.   Discuss any supplements or whole food supplements with your pharmacist.   If you drink alcohol, drink only in moderation while taking warfarin. If approved by your caregiver, have only 1 to 2 drinks per day. One drink = 5 oz of wine, 12 oz of beer, or 1  oz of hard liquor.  Document Released: 07/01/2009 Document Revised: 08/23/2011 Document Reviewed: 07/01/2009 [facility] Patient Information 2012 [place], [place].  Warfarin  Warfarin is a blood thinner (anticoagulant) medicine. It is used to keep clots from forming in your blood. When you take  warfarin, you may bruise easily. You may also bleed a little longer if you cut yourself.  Before taking warfarin, tell your doctor if:  You take any other medicine for your heart or blood pressure.   You are pregnant.   You plan to get pregnant.   You are breastfeeding.   You are allergic to any medicine.  HOME CARE  Take warfarin as told by your doctor. Do not stop the medicine unless your doctor tells you to.   Take your medicine at the same time every day.   Do not take anything that has aspirin in it unless your doctor says it is okay.   Do not drink alcohol.   Tell all your doctors and dentists that you are taking warfarin before they treat you or give you any medicines.   Keep all your appointments for doctor visits and blood tests.   Keep warfarin out of reach of children. Do not share warfarin with anyone.   Eat about the same amount of vitamin K foods every day.   High vitamin K foods: Beef liver. Pork liver. Green tea. Broccoli. Brussels sprouts. Cauliflower. Chickpeas. Kale. Spinach. Turnip greens.   Medium vitamin K foods: Chicken liver. Pork tenderloin. Cheddar cheese. Rolled oats. Coffee. Asparagus. Cabbage. Iceberg lettuce.   Low vitamin K foods: Apples. Butter. Bananas. Skim or 1% milk. Orange rice. Canned pears. White bread. Strawberries. Corn. Tomatoes. Green beans. Eggs. Potatoes. Tomasa Blase. Pumpkin. Chicken breasts. Ground beef. Oil (except soybean oil).  GET HELP RIGHT AWAY IF:  You miss a dose of warfarin. Do not take 2 doses at the same time.   You have a skin rash.   You have heavy or unusual bleeding.   There is blood in your pee (urine) or poop (stool).   You have side effects from medicine that do not get better after a few days.  MAKE SURE YOU:  Understand these instructions.   Will watch your condition.   Will get help right away if you are not doing well or get worse.  Document Released: 10/06/2010 Document Revised: 08/23/2011 Document  Reviewed: 10/06/2010  Acute Bronchitis Bronchitis is when the organs and tissues involved in breathing get puffy (swollen) and can leak fluid. This makes it harder for air to get in and out of the lungs. You may cough a lot and produce thick spit (mucus).  Acute means the illness started suddenly. HOME CARE  Rest.   Drink enough fluids to keep the pee (urine) clear or pale yellow.   Medicines may be given that will open up your airways to help you breathe better. Only take medicine as told by your doctor.   Use a cool mist vaporizer. This will help to thin any thick spit.   Do not smoke. Avoid secondhand smoke.  GET HELP RIGHT AWAY IF:   You have a temperature by mouth above 102 F (38.9 C), not controlled by medicine.   You have chills.   You develop severe shortness of breath or chest pain.   You have bloody spit mixed with mucus (sputum).   You throw up (vomit) often.   You lose too much body fluid (dehydrated).   You have a severe headache.   You feel faint.   You do not improve after 1 week of treatment.  MAKE SURE YOU:   Understand these instructions.   Will watch your condition.   Will get help right away if you are not doing well or get worse.  Document Released: 02/20/2008 Document Revised: 08/23/2011 Document Reviewed: 09/21/2009 [facility] Patient Information 2012 [place], [place]. [employer] Patient Information 2012 [place], [place].  Cough, Adult  A cough is a reflex. It helps you clear your throat and airways. A cough can help heal your body. A cough can last 2 or 3 weeks (acute) or may last more than 8 weeks (chronic). Some common causes of a cough can include an infection, allergy, or a cold. HOME CARE  Only take medicine as told by your doctor.   If given, take your medicines (antibiotics) as told. Finish them even if you start to feel better.   Use a cold steam vaporizer or humidier in your home. This can help loosen thick spit (secretions).   Sleep  so you are almost sitting up (semi-upright). Use pillows to do this. This helps reduce coughing.   Rest as needed.   Stop smoking if you smoke.  GET HELP RIGHT AWAY IF:  You have yellowish-white fluid (pus) in your thick spit.   Your cough gets worse.   Your medicine does not reduce coughing, and you are losing sleep.   You cough up blood.   You have trouble breathing.   Your pain gets worse and medicine does not help.   You have a fever.  MAKE SURE YOU:   Understand these instructions.   Will watch your condition.   Will get help right away if you are not doing well or get worse.  Document Released: 05/17/2011 Document Revised: 08/23/2011 Document Reviewed: 05/17/2011 [facility] Patient Information 2012 [place], [place].  Bronchospasm A bronchospasm is when the tubes that carry air in and out of your lungs (bronchioles) become smaller. It is hard to breathe when this happens. A bronchospasm can be caused by:  Asthma.   Allergies.   Lung infection.  HOME CARE   Do not  smoke. Avoid places that have secondhand smoke.   Dust your house often. Have your air ducts cleaned once or twice a year.   Find out what allergies may cause your bronchospasms.   Use your inhaler properly if you have one. Know when to use it.   Eat healthy foods and drink plenty of water.   Only take medicine as told by your doctor.  GET HELP RIGHT AWAY IF:  You feel you cannot breathe or catch your breath.   You cannot stop  coughing.   Your treatment is not helping you breathe better.  MAKE SURE YOU:   Understand these instructions.   Will watch your condition.   Will get help right away if you are not doing well or get worse.  Document Released: 07/01/2009 Document Revised: 08/23/2011 Document Reviewed: 07/01/2009  Pulmonary Embolus A pulmonary (lung) embolus (PE) is a blood clot that has traveled from another place in the body to the lung. Most clots come from deep veins in the legs  or pelvis. PE is a dangerous and potentially life-threatening condition that can be treated if identified. CAUSES Blood clots form in a vein for different reasons. Usually several things cause blood clots. They include:  The flow of blood slows down.   The inside of the vein is damaged in some way.   The person has a condition that makes the blood clot more easily. These conditions may include:   Older age (especially over 61 years old).   Having a history of blood clots.   Having major or lengthy surgery. Hip surgery is particularly high-risk.   Breaking a hip or leg.   Sitting or lying still for a long time.   Cancer or cancer treatment.   Having a long, thin tube (catheter) placed inside a vein during a medical procedure.   Being overweight (obese).   Pregnancy and childbirth.   Medicines with estrogen.   Smoking.   Other circulation or heart problems.  SYMPTOMS  The symptoms of a PE usually start suddenly and include:  Shortness of breath.   Coughing.   Coughing up blood or blood-tinged mucus (phlegm).   Chest pain. Pain is often worse with deep breaths.   Rapid heartbeat.  DIAGNOSIS  If a PE is suspected, your caregiver will take a medical history and carry out a physical exam. Your caregiver will check for the risk factors listed above. Tests that also may be required include:  Blood tests, including studies of the clotting properties of your blood.   Imaging tests. Ultrasound, CT, MRI, and other tests can all be used to see if you have clots in your legs or lungs. If you have a clot in your legs and have breathing or chest problems, your caregiver may conclude that you have a clot in your lungs. Further lung tests may not be needed.   An EKG can look for heart strain from blood clots in the lungs.  PREVENTION   Exercise the legs regularly. Take a brisk 30 minute walk every day.   Maintain a weight that is appropriate for your height.   Avoid sitting  or lying in bed for long periods of time without moving your legs.   Women, particularly those over the age of 12, should consider the risks and benefits of taking estrogen medicines, including birth control pills.   Do not smoke, especially if you take estrogen medicines.   Long-distance travel can increase your risk. You should exercise your legs by walking or pumping the muscles every hour.   In hospital prevention:   Your caregiver will assess your need for preventive PE care (prophylaxis) when you are admitted to the hospital. If you are having surgery, your surgeon will assess you the day of or day after surgery.   Prevention may include medical and nonmedical measures.  TREATMENT   The most common treatment for a PE is blood thinning (anticoagulant) medicine, which reduces the blood's tendency to clot. Anticoagulants can stop new blood clots from forming  and old ones from growing. They cannot dissolve existing clots. Your body does this by itself over time. Anticoagulants can be given by mouth, by intravenous (IV) access, or by injection. Your caregiver will determine the best program for you.   Less commonly, clot-dissolving drugs (thrombolytics) are used to dissolve a PE. They carry a high risk of bleeding, so they are used mainly in severe cases.   Very rarely, a blood clot in the leg needs to be removed surgically.   If you are unable to take anticoagulants, your caregiver may arrange for you to have a filter placed in a main vein in your belly (abdomen). This filter prevents clots from traveling to your lungs.  HOME CARE INSTRUCTIONS   Take all medicines prescribed by your caregiver. Follow the directions carefully.   You will most likely continue taking anticoagulants after you leave the hospital. Your caregiver will advise you on the length of treatment (usually 3 to 6 months, sometimes for life).   Taking too much or too little of an anticoagulant is dangerous. While taking  this type of medicine, you will need to have regular blood tests to be sure the dose is correct. The dose can change for many reasons. It is critically important that you take this medicine exactly as prescribed and that you have blood tests exactly as directed.   Many foods can interfere with anticoagulants. These include foods high in vitamin K, such as spinach, kale, broccoli, cabbage, collard and turnip greens, Brussels sprouts, peas, cauliflower, seaweed, parsley, beef and pork liver, green tea, and soybean oil. Your caregiver should discuss limits on these foods with you or you should arrange a visit with a dietician to answer your questions.   Many medicines can interfere with anticoagulants. You must tell your caregiver about any and all medicines you take. This includesall vitamins and supplements. Be especially cautious with aspirin and anti-inflammatory medicines. Ask your caregiver before taking these.   Anticoagulants can have side effects, mostly excessive bruising or bleeding. You will need to hold pressure over cuts for longer than usual. Avoid alcoholic drinks or consume only very small amounts while taking this medicine.   If you are taking an anticoagulant:   Wear a medical alert bracelet.   Notify your dentist or other caregivers before procedures.   Avoid contact sports.   Ask your caregiver how soon you can go back to normal activities. Not being active can lead to new clots. Ask for a list of what you should and should not do.   Exercise your lower leg muscles. This is important while traveling.   You may need to wear compression stockings. These are tight elastic stockings that apply pressure to the lower legs. This can help keep the blood in the legs from clotting.   If you are a smoker, you should quit.   Learn as much as you can about pulmonary embolisms.  SEEK MEDICAL CARE IF:   You notice a rapid heartbeat.   You feel weaker or more tired than usual.   You  feel faint.   You notice increased bruising.   Your symptoms are not getting better in the time expected.   You are having side effects of medicine.   You have an oral temperature above 102 F (38.9 C).   You discover other family members with blood clots. This may require further testing for inherited diseases or conditions.  SEEK IMMEDIATE MEDICAL CARE IF:   You have chest pain.  You have trouble breathing.   You have new or increased swelling or pain in one leg.   You cough up blood.   You notice blood in vomit, in a bowel movement, or in urine.   You have an oral temperature above 102 F (38.9 C), not controlled by medicine.  You may have another PE. A blood clot in the lungs is a medical emergency. Call your local emergency services (911 in [place] to get to the nearest hospital or clinic. Do not drive yourself. MAKE SURE YOU:   Understand these instructions.   Will watch your condition.   Will get help right away if you are not doing well or get worse.  Document Released: 08/31/2000 Document Revised: 08/23/2011 Document Reviewed: 03/07/2009 [facility] Patient Information 2012 [place], [place]. [employer] Patient Information 2012 [place], [place].  Diabetes, Type 2 Diabetes is a long-lasting (chronic) disease. In type 2 diabetes, the pancreas does not make enough insulin (a hormone), and the body does not respond normally to the insulin that is made. This type of diabetes was also previously called adult-onset diabetes. It usually occurs after the age of 8, but it can occur at any age.  CAUSES  Type 2 diabetes happens because the pancreasis not making enough insulin or your body has trouble using the insulin that your pancreas does make properly. SYMPTOMS   Drinking more than usual.   Urinating more than usual.   Blurred vision.   Dry, itchy skin.   Frequent infections.   Feeling more tired than usual (fatigue).  DIAGNOSIS The diagnosis of type 2 diabetes is  usually made by one of the following tests:  Fasting blood glucose test. You will not eat for at least 8 hours and then take a blood test.   Random blood glucose test. Your blood glucose (sugar) is checked at any time of the day regardless of when you ate.   Oral glucose tolerance test (OGTT). Your blood glucose is measured after you have not eaten (fasted) and then after you drink a glucose containing beverage.  TREATMENT   Healthy eating.   Exercise.   Medicine, if needed.   Monitoring blood glucose.   Seeing your caregiver regularly.  HOME CARE INSTRUCTIONS   Check your blood glucose at least once a day. More frequent monitoring may be necessary, depending on your medicines and on how well your diabetes is controlled. Your caregiver will advise you.   Take your medicine as directed by your caregiver.   Do not smoke.   Make wise food choices. Ask your caregiver for information. Weight loss can improve your diabetes.   Learn about low blood glucose (hypoglycemia) and how to treat it.   Get your eyes checked regularly.   Have a yearly physical exam. Have your blood pressure checked and your blood and urine tested.   Wear a pendant or bracelet saying that you have diabetes.   Check your feet every night for cuts, sores, blisters, and redness. Let your caregiver know if you have any problems.  SEEK MEDICAL CARE IF:   You have problems keeping your blood glucose in target range.   You have problems with your medicines.   You have symptoms of an illness that do not improve after 24 hours.   You have a sore or wound that is not healing.   You notice a change in vision or a new problem with your vision.   You have a fever.  MAKE SURE YOU:  Understand these instructions.  Will watch your condition.   Will get help right away if you are not doing well or get worse.  Document Released: 09/03/2005 Document Revised: 08/23/2011 Document Reviewed: 02/19/2011 [facility]  Patient Information 2012 [place], [place].  Appendicitis Appendicitis is when the appendix is swollen (inflamed). The inflammation can lead to developing a hole (perforation) and a collection of pus (abscess). CAUSES  There is not always an obvious cause of appendicitis. Sometimes it is caused by an obstruction in the appendix. The obstruction can be caused by:  A small, hard, pea-sized ball of stool (fecalith).   Enlarged lymph glands in the appendix.  SYMPTOMS   Pain around your belly button (navel) that moves toward your lower right belly (abdomen). The pain can become more severe and sharp as time passes.   Tenderness in the lower right abdomen. Pain gets worse if you cough or make a sudden movement.   Feeling sick to your stomach (nauseous).   Throwing up (vomiting).   Loss of appetite.   Fever.   Constipation.   Diarrhea.   Generally not feeling well.  DIAGNOSIS   Physical exam.   Blood tests.   Urine test.   X-rays or a CT scan may confirm the diagnosis.  TREATMENT  Once the diagnosis of appendicitis is made, the most common treatment is to remove the appendix as soon as possible. This procedure is called appendectomy. In an open appendectomy, a cut (incision) is made in the lower right abdomen and the appendix is removed. In a laparoscopic appendectomy, usually 3 small incisions are made. Long, thin instruments and a camera tube are used to remove the appendix. Most patients go home in 24 to 28 hours after appendectomy. In some situations, the appendix may have already perforated and an abscess may have formed. The abscess may have a "wall" around it as seen on a CT scan. In this case, a drain may be placed into the abscess and you may be treated with medicines (antibiotics) that kill germs. Once the abscess has resolved, it may or may not be necessary to have an appendectomy. Document Released: 09/03/2005 Document Revised: 08/23/2011 Document Reviewed:  11/29/2009 [facility] Patient Information 2012 [place], [place].

## 2011-12-10 NOTE — Progress Notes (Signed)
Patient complaining of pressure in her bladder and burning at times when she voids.  When inspecting her perineum, it is reddened throughout and appears to be yeast along her groin bilaterally.  Antifungal powder applied. Will continue to monitor.  Shivali Quackenbush, Joslyn Devon

## 2011-12-10 NOTE — Progress Notes (Signed)
Nutrition Follow-up  Diet Order:  Regular  Meds: Scheduled Meds:   . albuterol  2.5 mg Nebulization QID  . antiseptic oral rinse  15 mL Mouth Rinse q12n4p  . chlorhexidine  15 mL Mouth Rinse BID  . clonazePAM  0.5 mg Oral QHS  . cyclobenzaprine  5 mg Oral QHS  . DULoxetine  60 mg Oral Daily  . gabapentin  800 mg Oral BID  . insulin aspart  0-20 Units Subcutaneous Q6H  . ipratropium  0.5 mg Nebulization QID  . levothyroxine  25 mcg Oral QAC breakfast  . lidocaine   Other TID  . magnesium hydroxide  30 mL Oral Daily  . mometasone-formoterol  2 puff Inhalation BID  . oxyCODONE-acetaminophen  1 tablet Oral BID   And  . oxyCODONE  5 mg Oral BID  . pantoprazole  40 mg Oral Q1200  . saccharomyces boulardii  250 mg Oral BID  . warfarin  5 mg Oral ONCE-1800  . Warfarin - Pharmacist Dosing Inpatient   Does not apply q1800  . DISCONTD: albuterol  2.5 mg Nebulization QID  . DISCONTD: albuterol  2.5 mg Nebulization TID  . DISCONTD: ipratropium  0.5 mg Nebulization QID  . DISCONTD: ipratropium  0.5 mg Nebulization TID   Continuous Infusions:   . heparin 1,600 Units/hr (12/10/11 1018)   PRN Meds:.albuterol, ALPRAZolam, dextromethorphan-guaiFENesin, ipratropium, ondansetron, ondansetron, promethazine, promethazine-codeine, sodium chloride, SUMAtriptan, DISCONTD: guaiFENesin  Labs:  CMP     Component Value Date/Time   NA 138 12/08/2011 0603   K 4.0 12/08/2011 0603   CL 102 12/08/2011 0603   CO2 28 12/08/2011 0603   GLUCOSE 109* 12/08/2011 0603   BUN 3* 12/08/2011 0603   CREATININE 0.56 12/08/2011 0603   CALCIUM 8.8 12/08/2011 0603   PROT 6.1 12/08/2011 0603   ALBUMIN 2.6* 12/08/2011 0603   AST 11 12/08/2011 0603   ALT 6 12/08/2011 0603   ALKPHOS 53 12/08/2011 0603   BILITOT 0.2* 12/08/2011 0603   GFRNONAA >90 12/08/2011 0603   GFRAA >90 12/08/2011 0603     Intake/Output Summary (Last 24 hours) at 12/10/11 1442 Last data filed at 12/10/11 1115  Gross per 24 hour  Intake    965 ml  Output    2275 ml  Net  -1310 ml   Patient's diet has been advanced to a regular diet, with positive tolerance. Patient eating well, PO intake 100% at meals. Patient without any nutrition concerns at this time.   Weight Status:  232 lb.. Weight stable   Nutrition Dx:  Inadequate Oral Intake, Resolved.  Goal:  PO intake >75% at meals.   Intervention: No new intervention at this time.    Monitor:  Patient's diet has been advanced.  RD to follow for nutrition needs PRN.   Iven Finn [facility] Pager #:  [phone]

## 2011-12-10 NOTE — Progress Notes (Signed)
[facility] Inpatient Progress Note  12/10/2011  Subjective: Pt still having occasional cough spells and wheezing but overall improving.  I called and spoke with her PCP Dr. Ricki Miller and he said that he would be happy to follow her for the cough and wheeze and her PT/INR and coumadin management.    Objective:  Vital signs in last 24 hours: Filed Vitals:   12/10/11 1015 12/10/11 1018 12/10/11 1400 12/10/11 1459  BP:    123/73  Pulse:    98  Temp:    98.4 F (36.9 C)  TempSrc:    Oral  Resp:    16  Height:      Weight:      SpO2: 80% 93% 88% 90%   Weight change:   Intake/Output Summary (Last 24 hours) at 12/10/11 1621 Last data filed at 12/10/11 1500  Gross per 24 hour  Intake    965 ml  Output   2275 ml  Net  -1310 ml   Lab Results  Component Value Date   HGBA1C 6.7* 11/28/2011   Lab Results  Component Value Date   CREATININE 0.56 12/08/2011    Review of Systems As above, otherwise all reviewed and reported negative  Physical Exam General: Patient awake, alert, no distress, calm cooperative  Cardiovascular: normal s1, s2 sounds  Lungs: BBS with rare exp wheeze, improved from yesterday Abdomen: Positive bowel sounds, wounds clean dry. Nontender, no masses palpated  Extremities: scds bilateral  Neuro - awake and oriented x 4.  Lab Results: Results for orders placed during the hospital encounter of 11/27/11 (from the past 24 hour(s))  GLUCOSE, CAPILLARY     Status: Abnormal   Collection Time   12/09/11  4:58 PM      Component Value Range   Glucose-Capillary 146 (*) 70 - 99 (mg/dL)   Comment 1 Notify RN    GLUCOSE, CAPILLARY     Status: Abnormal   Collection Time   12/09/11 11:53 PM      Component Value Range   Glucose-Capillary 158 (*) 70 - 99 (mg/dL)  CBC     Status: Abnormal   Collection Time   12/10/11  5:06 AM      Component Value Range   WBC 6.2  4.0 - 10.5 (K/uL)   RBC 3.96  3.87 - 5.11 (MIL/uL)   Hemoglobin 10.7 (*) 12.0 - 15.0 (g/dL)   HCT 16.1  (*) 09.6 - 46.0 (%)   MCV 90.7  78.0 - 100.0 (fL)   MCH 27.0  26.0 - 34.0 (pg)   MCHC 29.8 (*) 30.0 - 36.0 (g/dL)   RDW 04.5  40.9 - 81.1 (%)   Platelets 385  150 - 400 (K/uL)  PROTIME-INR     Status: Abnormal   Collection Time   12/10/11  5:06 AM      Component Value Range   Prothrombin Time 28.2 (*) 11.6 - 15.2 (seconds)   INR 2.59 (*) 0.00 - 1.49   HEPARIN LEVEL (UNFRACTIONATED)     Status: Normal   Collection Time   12/10/11  5:06 AM      Component Value Range   Heparin Unfractionated 0.30  0.30 - 0.70 (IU/mL)  GLUCOSE, CAPILLARY     Status: Abnormal   Collection Time   12/10/11  6:02 AM      Component Value Range   Glucose-Capillary 100 (*) 70 - 99 (mg/dL)   Comment 1 Notify RN    GLUCOSE, CAPILLARY     Status: Abnormal  Collection Time   12/10/11 12:01 PM      Component Value Range   Glucose-Capillary 125 (*) 70 - 99 (mg/dL)  URINALYSIS, ROUTINE W REFLEX MICROSCOPIC     Status: Normal   Collection Time   12/10/11 12:52 PM      Component Value Range   Color, Urine YELLOW  YELLOW    APPearance CLEAR  CLEAR    Specific Gravity, Urine 1.011  1.005 - 1.030    pH 8.0  5.0 - 8.0    Glucose, UA NEGATIVE  NEGATIVE (mg/dL)   Hgb urine dipstick NEGATIVE  NEGATIVE    Bilirubin Urine NEGATIVE  NEGATIVE    Ketones, ur NEGATIVE  NEGATIVE (mg/dL)   Protein, ur NEGATIVE  NEGATIVE (mg/dL)   Urobilinogen, UA 0.2  0.0 - 1.0 (mg/dL)   Nitrite NEGATIVE  NEGATIVE    Leukocytes, UA NEGATIVE  NEGATIVE     Micro Results: No results found for this or any previous visit (from the past 240 hour(s)).  Medications:  Scheduled Meds:   . albuterol  2.5 mg Nebulization QID  . antiseptic oral rinse  15 mL Mouth Rinse q12n4p  . chlorhexidine  15 mL Mouth Rinse BID  . clonazePAM  0.5 mg Oral QHS  . cyclobenzaprine  5 mg Oral QHS  . DULoxetine  60 mg Oral Daily  . gabapentin  800 mg Oral BID  . insulin aspart  0-20 Units Subcutaneous Q6H  . ipratropium  0.5 mg Nebulization QID  .  levothyroxine  25 mcg Oral QAC breakfast  . lidocaine   Other TID  . magnesium hydroxide  30 mL Oral Daily  . mometasone-formoterol  2 puff Inhalation BID  . oxyCODONE-acetaminophen  1 tablet Oral BID   And  . oxyCODONE  5 mg Oral BID  . pantoprazole  40 mg Oral Q1200  . saccharomyces boulardii  250 mg Oral BID  . warfarin  5 mg Oral ONCE-1800  . Warfarin - Pharmacist Dosing Inpatient   Does not apply q1800  . DISCONTD: albuterol  2.5 mg Nebulization QID  . DISCONTD: albuterol  2.5 mg Nebulization TID  . DISCONTD: ipratropium  0.5 mg Nebulization QID  . DISCONTD: ipratropium  0.5 mg Nebulization TID   Continuous Infusions:   . heparin 1,600 Units/hr (12/10/11 1018)   PRN Meds:.albuterol, ALPRAZolam, dextromethorphan-guaiFENesin, ipratropium, ondansetron, ondansetron, promethazine, promethazine-codeine, sodium chloride, SUMAtriptan  Assessment/Plan:  I believe patient is medically stable for discharge home tomorrow.  I discussed this in detail with patient and called and spoke with her PCP.   Chronic Cough and wheezing - Pt's lung exam is improving daily.  I believe she is stable for discharge home.  I called and spoke with her PCP Dr. Ricki Miller and he says that he would be happy to follow up with her regarding her lung function and her coumadin.  I encouraged her to walk the halls and ambulate in the room more.  We have found no evidence of severe lung disease.  Please discharge pt with an albuterol inhaler to use as needed for wheezing, cough, Sob.  Pulmonary embolism (12/06/2011)  Patient started on heparin and Coumadin per surgery. DC heparin drip tomorrow. Decrease dose of coumadin to 5 mg daily with close follow up for PT check with Dr. Ricki Miller in 1-2 days.    Appendicitis, acute, with peritonitis (11/27/2011)  Status post appendectomy. Managed by general surgery team. Plan ambulating well, encouraged pt to use incentive spirometry today  Pt showing improvement overall.  Hypokalemia -  corrected   DIABETES MELLITUS, TYPE II (04/27/2010)  Pt does not believe that she has diabetes. She will discuss further with her PCP Dr. Ricki Miller at discharge. Blood sugars controlled with supplemental insulin. Pt should follow up with her PCP Dr. Ricki Miller to discuss diet control methods. A1c - 6.7%.   MIGRAINE HEADACHE (04/27/2010)  Stable, sumatriptan prn.    LOS: 13 days   Hayley [name] 12/10/2011, 4:21 PM   Cleora Fleet, MD, CDE, FAAFP [facility] [place], [place]  [phone]

## 2011-12-10 NOTE — Progress Notes (Signed)
Bladder scanned patient which revealed 0 cc after multiple tries.  Patient felt she did empty her bladder after voiding.  Will pass along to night RN to bladder scan as needed.  Ephrem Carrick, Joslyn Devon

## 2011-12-11 DIAGNOSIS — R0902 Hypoxemia: Secondary | ICD-10-CM | POA: Diagnosis not present

## 2011-12-11 DIAGNOSIS — I2699 Other pulmonary embolism without acute cor pulmonale: Secondary | ICD-10-CM | POA: Diagnosis not present

## 2011-12-11 LAB — CBC
HCT: 35.9 % — ABNORMAL LOW (ref 36.0–46.0)
Hemoglobin: 10.6 g/dL — ABNORMAL LOW (ref 12.0–15.0)
MCH: 27.2 pg (ref 26.0–34.0)
MCHC: 29.5 g/dL — ABNORMAL LOW (ref 30.0–36.0)
MCV: 92.3 fL (ref 78.0–100.0)
Platelets: 377 10*3/uL (ref 150–400)
RBC: 3.89 MIL/uL (ref 3.87–5.11)
RDW: 14.9 % (ref 11.5–15.5)
WBC: 5.9 10*3/uL (ref 4.0–10.5)

## 2011-12-11 LAB — PROTIME-INR
INR: 2.43 — ABNORMAL HIGH (ref 0.00–1.49)
Prothrombin Time: 26.8 seconds — ABNORMAL HIGH (ref 11.6–15.2)

## 2011-12-11 LAB — GLUCOSE, CAPILLARY
Glucose-Capillary: 114 mg/dL — ABNORMAL HIGH (ref 70–99)
Glucose-Capillary: 157 mg/dL — ABNORMAL HIGH (ref 70–99)

## 2011-12-11 LAB — HEPARIN LEVEL (UNFRACTIONATED): Heparin Unfractionated: 0.29 IU/mL — ABNORMAL LOW (ref 0.30–0.70)

## 2011-12-11 MED ORDER — FLUCONAZOLE 150 MG PO TABS
150.0000 mg | ORAL_TABLET | Freq: Once | ORAL | Status: AC
  Administered 2011-12-11: 150 mg via ORAL
  Filled 2011-12-11: qty 1

## 2011-12-11 MED ORDER — OXYCODONE-ACETAMINOPHEN 5-325 MG PO TABS: 1.0000 | ORAL_TABLET | ORAL | Status: AC | PRN

## 2011-12-11 MED ORDER — SACCHAROMYCES BOULARDII 250 MG PO CAPS: 250.0000 mg | ORAL_CAPSULE | Freq: Two times a day (BID) | ORAL | Status: AC

## 2011-12-11 MED ORDER — WARFARIN SODIUM 4 MG PO TABS
4.0000 mg | ORAL_TABLET | Freq: Once | ORAL | Status: AC
  Administered 2011-12-11: 4 mg via ORAL
  Filled 2011-12-11: qty 1

## 2011-12-11 MED ORDER — ALBUTEROL SULFATE HFA 108 (90 BASE) MCG/ACT IN AERS: 2.0000 | INHALATION_SPRAY | Freq: Four times a day (QID) | RESPIRATORY_TRACT | Status: DC | PRN

## 2011-12-11 MED ORDER — WARFARIN SODIUM 5 MG PO TABS: 5.0000 mg | ORAL_TABLET | Freq: Every day | ORAL | Status: AC

## 2011-12-11 NOTE — Progress Notes (Signed)
CARE MANAGEMENT NOTE 12/11/2011  Patient:  Hayley [name], Hayley [name]   Account Number:  [id number]  Date Initiated:  12/05/2011  Documentation initiated by:  [facility]  Subjective/Objective Assessment:   dx acute appendicitis with peritonitis ; lap appendectomy     Action/Plan:   Cm spoke with patient 03/19 regarding discharge plans. Patient plans to go home upon discharge with spouse as caregiver. Cm will follow for needs   Anticipated DC Date:  12/11/2011   Anticipated DC Plan:  HOME/SELF CARE  In-house referral  NA      DC Planning Services  CM consult      PAC Choice  DURABLE MEDICAL EQUIPMENT   Choice offered to / List presented to:  NA   DME arranged  [facility]      DME agency  [facility]     HH arranged  NA      HH agency  NA   Status of service:  Completed, signed off Medicare Important Message given?   (If response is "NO", the following Medicare IM given date fields will be blank) Date Medicare IM given:   Date Additional Medicare IM given:    Discharge Disposition:  HOME/SELF CARE  Per UR Regulation:    If discussed at Long Length of Stay Meetings, dates discussed:   12/05/2011    Comments:  12/11/2011 [name] BSN CCM [phone] Patient wants to use [facility] for oxygen because she purchased cpap machine from  them. Order for advanced cancelled. Spoke with Hayley [name] at [facility] [phone] and they can service patient. They requested that face sheet, orders for O2 , qualifying information and discharge summary be faxed to [phone] with confirmation of receipt. Portable oxygen to be delivered to patient's hospital room. Concentratior to be installed in patient's home this pm after she is discharged.

## 2011-12-11 NOTE — Progress Notes (Signed)
14 Days Post-Op  Subjective: In bed with CPAP.  Still having pain from abdomen.  Thinks she's ready to go home.  Objective: Vital signs in last 24 hours: Temp:  [96.5 F (35.8 C)-98.4 F (36.9 C)] 96.5 F (35.8 C) (03/26 0551) Pulse Rate:  [86-114] 86  (03/26 0551) Resp:  [16-22] 18  (03/26 0551) BP: (119-165)/(65-82) 165/71 mmHg (03/26 0551) SpO2:  [80 %-93 %] 90 % (03/26 0739) Last BM Date: 12/09/11  Intake/Output from previous day: 03/25 0701 - 03/26 0700 In: 600 [P.O.:600] Out: 2500 [Urine:2500] Intake/Output this shift: Total I/O In: 192 [I.V.:192] Out: -   General appearance: alert, cooperative and no distress Resp: clear to auscultation bilaterally GI: soft, non-tender; bowel sounds normal; no masses,  no organomegaly and incisions ok  Lab Results:   Basename 12/11/11 0440 12/10/11 0506  WBC 5.9 6.2  HGB 10.6* 10.7*  HCT 35.9* 35.9*  PLT 377 385    BMET No results found for this basename: NA:2,K:2,CL:2,CO2:2,GLUCOSE:2,BUN:2,CREATININE:2,CALCIUM:2 in the last 72 hours PT/INR  Basename 12/11/11 0440 12/10/11 0506  LABPROT 26.8* 28.2*  INR 2.43* 2.59*     Lab 12/08/11 0603 12/06/11 0505 12/05/11 1920  AST 11 12 15   ALT 6 6 6   ALKPHOS 53 48 52  BILITOT 0.2* 0.2* 0.2*  PROT 6.1 6.0 6.7  ALBUMIN 2.6* 2.4* 2.6*     Lipase     Component Value Date/Time   LIPASE 35 11/27/2011 1331     Studies/Results: No results found.  Medications:    . albuterol  2.5 mg Nebulization QID  . antiseptic oral rinse  15 mL Mouth Rinse q12n4p  . chlorhexidine  15 mL Mouth Rinse BID  . clonazePAM  0.5 mg Oral QHS  . cyclobenzaprine  5 mg Oral QHS  . DULoxetine  60 mg Oral Daily  . fluconazole  100 mg Oral Once  . gabapentin  800 mg Oral BID  . insulin aspart  0-20 Units Subcutaneous Q6H  . ipratropium  0.5 mg Nebulization QID  . levothyroxine  25 mcg Oral QAC breakfast  . lidocaine   Other TID  . magnesium hydroxide  30 mL Oral Daily  . mometasone-formoterol   2 puff Inhalation BID  . oxyCODONE-acetaminophen  1 tablet Oral BID   And  . oxyCODONE  5 mg Oral BID  . pantoprazole  40 mg Oral Q1200  . saccharomyces boulardii  250 mg Oral BID  . warfarin  4 mg Oral ONCE-1800  . Warfarin - Pharmacist Dosing Inpatient   Does not apply q1800  . DISCONTD: albuterol  2.5 mg Nebulization QID  . DISCONTD: albuterol  2.5 mg Nebulization TID  . DISCONTD: ipratropium  0.5 mg Nebulization QID  . DISCONTD: ipratropium  0.5 mg Nebulization TID    Assessment/Plan *Appendicitis, acute, with peritonitis and postop ileus that has resolved. Active Problems:  DIABETES MELLITUS, TYPE II  MIGRAINE HEADACHE  IBS  DYSPNEA  Spasmodic dysphonia  Pulmonary embolism, acute-on IV Heparin, Coumadin started INR pending this AM.  Thrombophlebitis  Chronic cough  Acute appendicitis with phlegmon, probable perforated s/APPENDECTOMY LAPAROSCOPIC 11/27/11. DR. Michaell Cowing  1) Post-op ileus: improving.  2) Pelvic pain: Etiology unclear ?neuropathy.  3) S/P appendectomy for acute appendicitis with peritonitis.  4) GERD: On Pantoprazole.  5) Diverticulosis.  6) Fatty liver.  7) Sleep apnea: on C-PAP.  8) BMI 37.3  Plan:  Check O2 walking and decide if she needs home O2   LOS: 14 days    Austine Kelsay 12/11/2011

## 2011-12-11 NOTE — Progress Notes (Signed)
Per Medicare requirements, O2 sats should be documented:  O2 Sat at rest on room air: ____85_____%  O2 Sat During Exercise:__85_______%  Recovery Sat on O2: ____87____% at 2 LPM

## 2011-12-11 NOTE — Progress Notes (Signed)
ANTICOAGULATION CONSULT NOTE - Follow Up Consult  Pharmacy Consult for Heparin/Coumadin Indication: pulmonary embolus  Allergies  Allergen Reactions  . Bee Anaphylaxis  . Etodolac Nausea And Vomiting  . Morphine Other (See Comments)    Hallucinations  . Propoxyphene N-Acetaminophen Nausea And Vomiting  . Penicillins Rash   Patient Measurements: Height: 5\' 6"  (167.6 cm) Weight: 232 lb 5.8 oz (105.4 kg) IBW/kg (Calculated) : 59.3   Vital Signs: Temp: 96.5 F (35.8 C) (03/26 0551) Temp src: Axillary (03/26 0551) BP: 165/71 mmHg (03/26 0551) Pulse Rate: 86  (03/26 0551)  Labs:  Basename 12/11/11 0440 12/10/11 0506 12/09/11 0614  HGB 10.6* 10.7* --  HCT 35.9* 35.9* 35.5*  PLT 377 385 313  APTT -- -- --  LABPROT 26.8* 28.2* 21.8*  INR 2.43* 2.59* 1.86*  HEPARINUNFRC 0.29* 0.30 0.28*  CREATININE -- -- --  CKTOTAL -- -- --  CKMB -- -- --  TROPONINI -- -- --   Estimated Creatinine Clearance: 84.8 ml/min (by C-G formula based on Cr of 0.56).  Assessment:  [name] s/p lap appendectomy on 3/12 and developed acute bilateral PE. Neg for DVT. Coumadin score =4.  Coumadin education completed by pharmacy.    HL therapeutic.  Today is Day 5 of Coumadin/heparin overlap. Full 5 days of heparin will end this afternoon. Will d/c heparin this afternoon considering INR also has been >2 for 2 days.    CBC stable.  No bleeding reported/documented.   INR 2.43 but held dose last night d/t rapid rise following 7.5 mg doses.  Will restart today at ~half of that dose.  Goal of Therapy:  Heparin level 0.3-0.7 INR 2-3   Plan:   Continue IV heparin until 1600 today.  Coumadin 4 mg po once today.  Agree with MD note to start patient on 5 mg daily at discharge and f/u PT/INR 1-2 days after discharge.   Clance Boll, PharmD, BCPS Pager: [phone] 12/11/2011 9:02 AM

## 2011-12-11 NOTE — Plan of Care (Signed)
Problem: Discharge Progression Outcomes Goal: Activity appropriate for discharge plan During PT session, while walking, sats: 85-91% on RA , 86-87% on 2 L O2.

## 2011-12-11 NOTE — Progress Notes (Signed)
Send home with oxygen. Follow up with primary care for shortness of breath, PE, and anticoagulation management.  Follow up with Dr. Michaell Cowing in 2-4 weeks regarding appendicitis.

## 2011-12-11 NOTE — Discharge Summary (Signed)
Agree with above.   Follow up with primary physician regarding PE and shortness of breath.

## 2011-12-11 NOTE — Progress Notes (Signed)
CARE MANAGEMENT NOTE 12/11/2011  Patient:  Hayley [name], Hayley [name]   Account Number:  [id number]  Date Initiated:  12/05/2011  Documentation initiated by:  [facility]  Subjective/Objective Assessment:   dx acute appendicitis with peritonitis ; lap appendectomy     Action/Plan:   Cm spoke with patient 03/19 regarding discharge plans. Patient plans to go home upon discharge with spouse as caregiver. Cm will follow for needs   Anticipated DC Date:  12/11/2011   Anticipated DC Plan:  HOME/SELF CARE  In-house referral  NA      DC Planning Services  CM consult      PAC Choice  DURABLE MEDICAL EQUIPMENT   Choice offered to / List presented to:  NA   DME arranged  [facility]      DME agency  [facility]     HH arranged  NA      HH agency  NA   Status of service:  Completed, signed off Medicare Important Message given?  no (If response is "NO", the following Medicare IM given date fields will be blank)  If discussed at Long Length of Stay Meetings, dates discussed:   12/05/2011    Comments:  12/11/2011 Colleen Can. RN BSN CCM [phone] Orders received for Home O2 therapy. Pt states she is planning to go back to her home where spouse will be caregiver. She has been walking in hallway and does not feel the need for RW. No recommendations for home PT. Pt states she will be following up with her Md and will have an appt with pulmonologist. [facility] notified of request for home O2.

## 2011-12-11 NOTE — Progress Notes (Signed)
Work toward home. May need home O2 if sats drop while walking.

## 2011-12-11 NOTE — Progress Notes (Signed)
Physical Therapy Treatment Patient Details Name: Hayley [name] MRN: [id number] DOB: [birth year] Today's Date: 12/11/2011   SATURATION QUALIFICATIONS:  Patient Saturations on Room Air at Rest = unable to assess (pt already up ambulating in hallway at start of session)  Patient Saturations on Room Air while Ambulating = fluctuating between 85-91%  Patient Saturations on 2 Liters of oxygen while Ambulating = 86-87%.  PT Assessment/Plan  PT - Assessment/Plan Comments on Treatment Session: Pt up walking in hallway at start of session. RN stating she needs to have sats measured while walking. Dyspnea 2/4 with ambulation. Sats: 85-91% on RA , 86-67% on 2 L O2 (pts O2 in room hooked up to CPAP on 7L-had to ask RN after session appropriate O2 level to set in room at end of seesion-3L per RN). Possible D/C home today.  PT Plan: Discharge plan remains appropriate Follow Up Recommendations: No PT follow up Equipment Recommended: None recommended by PT PT Goals  Acute Rehab PT Goals PT Goal: Sit to Stand - Progress: Progressing toward goal PT Goal: Stand to Sit - Progress: Progressing toward goal PT Goal: Ambulate - Progress: Progressing toward goal  PT Treatment Precautions/Restrictions  Precautions Required Braces or Orthoses: No Restrictions Weight Bearing Restrictions: No Mobility (including Balance) Bed Mobility Bed Mobility: No Transfers Transfers: Yes Sit to Stand: 5: Supervision Stand to Sit: 5: Supervision Ambulation/Gait Ambulation/Gait: Yes Ambulation/Gait Assistance: 5: Supervision Ambulation/Gait Assistance Details (indicate cue type and reason): Pt pushing IV pole. VCs for pursed lip breathing while ambulating and at rest.  Ambulation Distance (Feet): 300 Feet (300' x 1 on RA, 100' x1 on O2) Assistive device: None Gait Pattern: Step-through pattern  Posture/Postural Control Posture/Postural Control: No significant limitations Exercise    End of Session PT - End of  Session Activity Tolerance: Patient tolerated treatment well (Limited by decreased sats) Patient left: in bed;with call bell in reach General Behavior During Session: [facility] for tasks performed Cognition: [facility] for tasks performed  Rebeca Alert [facility] [address] 12/11/2011, 11:08 AM [phone]

## 2011-12-12 ENCOUNTER — Encounter (HOSPITAL_COMMUNITY): Payer: Self-pay | Admitting: Surgery

## 2011-12-17 DIAGNOSIS — J385 Laryngeal spasm: Secondary | ICD-10-CM | POA: Diagnosis not present

## 2011-12-19 DIAGNOSIS — I2699 Other pulmonary embolism without acute cor pulmonale: Secondary | ICD-10-CM | POA: Diagnosis not present

## 2011-12-19 DIAGNOSIS — E039 Hypothyroidism, unspecified: Secondary | ICD-10-CM | POA: Diagnosis not present

## 2011-12-21 DIAGNOSIS — I781 Nevus, non-neoplastic: Secondary | ICD-10-CM | POA: Diagnosis not present

## 2011-12-21 DIAGNOSIS — B373 Candidiasis of vulva and vagina: Secondary | ICD-10-CM | POA: Diagnosis not present

## 2011-12-24 ENCOUNTER — Encounter (INDEPENDENT_AMBULATORY_CARE_PROVIDER_SITE_OTHER): Payer: Medicare Other | Admitting: Surgery

## 2011-12-25 DIAGNOSIS — Z7901 Long term (current) use of anticoagulants: Secondary | ICD-10-CM | POA: Diagnosis not present

## 2011-12-25 DIAGNOSIS — I2699 Other pulmonary embolism without acute cor pulmonale: Secondary | ICD-10-CM | POA: Diagnosis not present

## 2011-12-26 DIAGNOSIS — G43709 Chronic migraine without aura, not intractable, without status migrainosus: Secondary | ICD-10-CM | POA: Diagnosis not present

## 2011-12-27 DIAGNOSIS — G603 Idiopathic progressive neuropathy: Secondary | ICD-10-CM | POA: Diagnosis not present

## 2011-12-27 DIAGNOSIS — Z7901 Long term (current) use of anticoagulants: Secondary | ICD-10-CM | POA: Diagnosis not present

## 2011-12-27 DIAGNOSIS — I2699 Other pulmonary embolism without acute cor pulmonale: Secondary | ICD-10-CM | POA: Diagnosis not present

## 2011-12-27 DIAGNOSIS — G4733 Obstructive sleep apnea (adult) (pediatric): Secondary | ICD-10-CM | POA: Diagnosis not present

## 2011-12-28 DIAGNOSIS — M1710 Unilateral primary osteoarthritis, unspecified knee: Secondary | ICD-10-CM | POA: Diagnosis not present

## 2011-12-31 DIAGNOSIS — Z8679 Personal history of other diseases of the circulatory system: Secondary | ICD-10-CM | POA: Diagnosis not present

## 2011-12-31 DIAGNOSIS — Z7901 Long term (current) use of anticoagulants: Secondary | ICD-10-CM | POA: Diagnosis not present

## 2011-12-31 DIAGNOSIS — R42 Dizziness and giddiness: Secondary | ICD-10-CM | POA: Diagnosis not present

## 2011-12-31 DIAGNOSIS — I2699 Other pulmonary embolism without acute cor pulmonale: Secondary | ICD-10-CM | POA: Diagnosis not present

## 2012-01-02 ENCOUNTER — Other Ambulatory Visit: Payer: Self-pay | Admitting: Internal Medicine

## 2012-01-02 DIAGNOSIS — R51 Headache: Secondary | ICD-10-CM | POA: Diagnosis not present

## 2012-01-02 DIAGNOSIS — I2699 Other pulmonary embolism without acute cor pulmonale: Secondary | ICD-10-CM | POA: Diagnosis not present

## 2012-01-02 DIAGNOSIS — R519 Headache, unspecified: Secondary | ICD-10-CM

## 2012-01-03 ENCOUNTER — Other Ambulatory Visit: Payer: Medicare Other

## 2012-01-04 ENCOUNTER — Ambulatory Visit (INDEPENDENT_AMBULATORY_CARE_PROVIDER_SITE_OTHER): Payer: Medicare Other | Admitting: Surgery

## 2012-01-04 ENCOUNTER — Encounter (INDEPENDENT_AMBULATORY_CARE_PROVIDER_SITE_OTHER): Payer: Self-pay | Admitting: Surgery

## 2012-01-04 ENCOUNTER — Ambulatory Visit
Admission: RE | Admit: 2012-01-04 | Discharge: 2012-01-04 | Disposition: A | Discharge status: Home / Self Care | Payer: Medicare Other | Source: Ambulatory Visit | Attending: Internal Medicine | Admitting: Internal Medicine

## 2012-01-04 VITALS — BP 119/88 | HR 80 | Temp 96.8°F | Ht 64.5 in | Wt 227.2 lb

## 2012-01-04 DIAGNOSIS — R519 Headache, unspecified: Secondary | ICD-10-CM

## 2012-01-04 DIAGNOSIS — K3533 Acute appendicitis with perforation and localized peritonitis, with abscess: Secondary | ICD-10-CM

## 2012-01-04 DIAGNOSIS — H532 Diplopia: Secondary | ICD-10-CM | POA: Diagnosis not present

## 2012-01-04 DIAGNOSIS — K352 Acute appendicitis with generalized peritonitis, without abscess: Secondary | ICD-10-CM

## 2012-01-04 DIAGNOSIS — R51 Headache: Secondary | ICD-10-CM | POA: Diagnosis not present

## 2012-01-04 NOTE — Patient Instructions (Signed)
Appendicitis °Appendicitis is when the appendix is swollen (inflamed). The inflammation can lead to developing a hole (perforation) and a collection of pus (abscess). °CAUSES  °There is not always an obvious cause of appendicitis. Sometimes it is caused by an obstruction in the appendix. The obstruction can be caused by: °· A small, hard, pea-sized ball of stool (fecalith).  °· Enlarged lymph glands in the appendix.  °SYMPTOMS  °· Pain around your belly button (navel) that moves toward your lower right belly (abdomen). The pain can become more severe and sharp as time passes.  °· Tenderness in the lower right abdomen. Pain gets worse if you cough or make a sudden movement.  °· Feeling sick to your stomach (nauseous).  °· Throwing up (vomiting).  °· Loss of appetite.  °· Fever.  °· Constipation.  °· Diarrhea.  °· Generally not feeling well.  °DIAGNOSIS  °· Physical exam.  °· Blood tests.  °· Urine test.  °· X-rays or a CT scan may confirm the diagnosis.  °TREATMENT  °Once the diagnosis of appendicitis is made, the most common treatment is to remove the appendix as soon as possible. This procedure is called appendectomy. In an open appendectomy, a cut (incision) is made in the lower right abdomen and the appendix is removed. In a laparoscopic appendectomy, usually 3 small incisions are made. Long, thin instruments and a camera tube are used to remove the appendix. Most patients go home in 24 to 28 hours after appendectomy. °In some situations, the appendix may have already perforated and an abscess may have formed. The abscess may have a "wall" around it as seen on a CT scan. In this case, a drain may be placed into the abscess and you may be treated with medicines (antibiotics) that kill germs. Once the abscess has resolved, it may or may not be necessary to have an appendectomy. °Document Released: 09/03/2005 Document Revised: 08/23/2011 Document Reviewed: 11/29/2009 °[employer]® Patient Information ©2012 [employer]. °

## 2012-01-04 NOTE — Progress Notes (Signed)
Subjective:     Patient ID: Hayley [name], female   DOB: 1946-02-09, 66 y.o.   MRN: [id number]  HPI  Hayley [name]  1946-08-29 [id number]  Patient Care Team: Juline Patch, MD as PCP - General (Internal Medicine) Charna Elizabeth, MD as Consulting Physician (Gastroenterology)  This patient is a 66 y.o.female who presents today for surgical evaluation.   Diagnosis: Acute appendicitis with probable early perforation. Phlegmon.  Procedure: Laparoscopic appendectomy lysing lesions 11/27/2011  Pathology transmural appendicitis.  The patient comes today with her husband. She had a rocky postoperative course. She developed a pulmonary embolism. She is now formally anticoagulated on warfarin. Notes some knee pain. No injections for that. Having some questionable double vision and some lightheadedness. She is due to get some brain scans soon. Followed by Dr. pain for that.  Appetite is returning. Had some abdominal pain with bowel movement early. That is improving. No fevers or chills. Mild nausea but improving.  Patient Active Problem List  Diagnoses  . DIABETES MELLITUS, TYPE II  . Obesity, Class III, BMI 40-49.9 (morbid obesity)  . ANEMIA  . ANXIETY DEPRESSION  . DEPRESSION  . MIGRAINE HEADACHE  . NEUROPATHY  . HEMORRHOIDS  . GERD  . DIVERTICULOSIS, COLON  . IBS  . CHOLELITHIASIS  . RENAL CALCULUS  . DIAPHORESIS  . DYSPNEA  . HYPOXEMIA  . Appendicitis, acute, with peritonitis  . Spasmodic dysphonia  . Pulmonary embolism  . Thrombophlebitis    Past Medical History  Diagnosis Date  . Hyperthyroidism   . Toxic goiter   . Reflux   . Colon polyps   . Osteoarthritis     in back  . Migraines   . Disorder of vocal cords     spasmotic dysphonia  . Obesity, Class III, BMI 40-49.9 (morbid obesity) 04/27/2010  . Pelvic pain   . Anemia   . Clotting disorder   . Neuromuscular disorder     Past Surgical History  Procedure Date  . Abdominal hysterectomy   . Zenkers diverticulum   .  Kidney stone surgery   . Laparoscopic appendectomy 11/27/2011    Procedure: APPENDECTOMY LAPAROSCOPIC;  Surgeon: Ardeth Sportsman, MD;  Location: [facility];  Service: General;  Laterality: N/A;  . Appendectomy   . Cholecystectomy   . Knee arthroscopy 1998    right  . Hand surgery 2004    both hands    History   Social History  . Marital Status: Married    Spouse Name: N/A    Number of Children: N/A  . Years of Education: N/A   Occupational History  . Not on file.   Social History Main Topics  . Smoking status: Former Smoker    Quit date: 01/03/1982  . Smokeless tobacco: Never Used  . Alcohol Use: No  . Drug Use: No  . Sexually Active:    Other Topics Concern  . Not on file   Social History Narrative  . No narrative on file    Family History  Problem Relation Age of Onset  . Heart disease Mother     Current Outpatient Prescriptions  Medication Sig Dispense Refill  . albuterol (PROVENTIL HFA;VENTOLIN HFA) 108 (90 BASE) MCG/ACT inhaler Inhale 2 puffs into the lungs every 6 (six) hours as needed for wheezing or shortness of breath.  1 Inhaler  1  . aspirin-sod bicarb-citric acid (ALKA-SELTZER) 325 MG TBEF Take 325 mg by mouth every 6 (six) hours as needed.      . clonazePAM (  KLONOPIN) 0.5 MG tablet 0.5 mg daily as needed. For agitation or anxiety      . cyclobenzaprine (FLEXERIL) 10 MG tablet Take 10 mg by mouth at bedtime.      . diclofenac (FLECTOR) 1.3 % PTCH Place 1 patch onto the skin 2 (two) times daily. Apply to back      . DULoxetine (CYMBALTA) 60 MG capsule Take 60 mg by mouth daily.      [name] EPINEPHrine (EPIPEN) 0.3 mg/0.3 mL DEVI Inject 0.3 mg into the muscle once.      . gabapentin (NEURONTIN) 800 MG tablet Take 800 mg by mouth 2 (two) times daily.      [name] levothyroxine (SYNTHROID, LEVOTHROID) 25 MCG tablet Take 25 mcg by mouth daily.      . metoprolol succinate (TOPROL-XL) 50 MG 24 hr tablet       . mometasone-formoterol (DULERA) 100-5 MCG/ACT AERO Inhale 2  puffs into the lungs 2 (two) times daily.      [name] nystatin-triamcinolone (MYCOLOG II) cream       . ondansetron (ZOFRAN-ODT) 8 MG disintegrating tablet       . oxyCODONE-acetaminophen (PERCOCET) 10-325 MG per tablet Take 1 tablet by mouth 2 (two) times daily.      . promethazine (PHENERGAN) 25 MG tablet Take 25 mg by mouth every 8 (eight) hours as needed. For nausea      . SUMAtriptan (IMITREX) 6 MG/0.5ML SOLN injection Inject 6 mg into the skin every 2 (two) hours as needed.      [name] terconazole (TERAZOL 7) 0.4 % vaginal cream       . warfarin (COUMADIN) 5 MG tablet Take 1 tablet (5 mg total) by mouth daily.  30 tablet  0  . DISCONTD: metoprolol succinate (TOPROL-XL) 50 MG 24 hr tablet Take 50 mg by mouth every other day.         Allergies  Allergen Reactions  . Bee Anaphylaxis  . Etodolac Nausea And Vomiting  . Morphine Other (See Comments)    Hallucinations  . Propoxacet-N Nausea And Vomiting  . Penicillins Rash    BP 119/88  Pulse 80  Temp(Src) 96.8 F (36 C) (Temporal)  Ht 5' 4.5" (1.638 m)  Wt 227 lb 3.2 oz (103.057 kg)  BMI 38.40 kg/m2  SpO2 92%     Review of Systems  Constitutional: Negative for fever, chills, diaphoresis, activity change, appetite change, fatigue and unexpected weight change.  HENT: Negative for ear pain, sore throat and trouble swallowing.   Eyes: Positive for visual disturbance. Negative for photophobia.  Respiratory: Positive for cough, shortness of breath and wheezing. Negative for apnea, choking, chest tightness and stridor.   Cardiovascular: Negative for chest pain and palpitations.  Gastrointestinal: Positive for nausea and abdominal pain. Negative for vomiting, diarrhea, constipation, blood in stool, abdominal distention, anal bleeding and rectal pain.  Genitourinary: Negative for dysuria, frequency and difficulty urinating.  Musculoskeletal: Positive for myalgias and arthralgias. Negative for gait problem.  Skin: Negative for color change,  pallor and rash.  Neurological: Positive for light-headedness. Negative for dizziness, speech difficulty, weakness and numbness.  Hematological: Negative for adenopathy.  Psychiatric/Behavioral: Negative for confusion and agitation. The patient is not nervous/anxious.        Objective:   Physical Exam  Constitutional: She is oriented to person, place, and time. She appears well-developed and well-nourished. No distress.  HENT:  Head: Normocephalic.  Mouth/Throat: Oropharynx is clear and moist. No oropharyngeal exudate.  Eyes: Conjunctivae and EOM are normal.  Pupils are equal, round, and reactive to light. No scleral icterus.  Neck: Normal range of motion. No tracheal deviation present.  Cardiovascular: Normal rate and intact distal pulses.   Pulmonary/Chest: Effort normal. No respiratory distress. She exhibits no tenderness.  Abdominal: Soft. She exhibits no distension. There is no tenderness. Hernia confirmed negative in the right inguinal area and confirmed negative in the left inguinal area.       Obese.  Incisions clean with normal healing ridges.  No hernias  Genitourinary: No vaginal discharge found.  Musculoskeletal: Normal range of motion. She exhibits no tenderness.  Lymphadenopathy:       Right: No inguinal adenopathy present.       Left: No inguinal adenopathy present.  Neurological: She is alert and oriented to person, place, and time. No cranial nerve deficit. She exhibits normal muscle tone. Coordination normal.  Skin: Skin is warm and dry. No rash noted. She is not diaphoretic.  Psychiatric: She has a normal mood and affect. Her behavior is normal.       Assessment:     5 weeks s/p emergent lap LOA/appy for appendicitis, slowly recovering     Plan:     Increase activity as tolerated.  Do not push through pain.  Advanced on diet as tolerated. Bowel regimen to avoid problems.  I went over the surgery, pathology, and hospital course.  Given her numerous health  issues and chronic pain and emergent surgery, I am not surprised she is not back to normal yet. However, I am hopeful that she will continue to improve. She is out of the window for a postoperative abscess. There is no evidence of bowel traction. The incisions are healing well. No evidence of wound infection.  I agree with her workup to evaluate for her visual changes and lightheadedness. She does not  toxic to me and was in a good mood at the end of the visit. Her husband is reassured. I gave copy of the pathology report and OR report per their request.  Return to clinic p.r.n. The patient expressed understanding and appreciation

## 2012-01-07 DIAGNOSIS — B373 Candidiasis of vulva and vagina: Secondary | ICD-10-CM | POA: Diagnosis not present

## 2012-01-08 DIAGNOSIS — Z7901 Long term (current) use of anticoagulants: Secondary | ICD-10-CM | POA: Diagnosis not present

## 2012-01-08 DIAGNOSIS — I2699 Other pulmonary embolism without acute cor pulmonale: Secondary | ICD-10-CM | POA: Diagnosis not present

## 2012-01-11 DIAGNOSIS — J385 Laryngeal spasm: Secondary | ICD-10-CM | POA: Diagnosis not present

## 2012-01-14 DIAGNOSIS — R0602 Shortness of breath: Secondary | ICD-10-CM | POA: Diagnosis not present

## 2012-01-14 DIAGNOSIS — R0989 Other specified symptoms and signs involving the circulatory and respiratory systems: Secondary | ICD-10-CM | POA: Diagnosis not present

## 2012-01-14 DIAGNOSIS — R0609 Other forms of dyspnea: Secondary | ICD-10-CM | POA: Diagnosis not present

## 2012-01-16 DIAGNOSIS — M21069 Valgus deformity, not elsewhere classified, unspecified knee: Secondary | ICD-10-CM | POA: Diagnosis not present

## 2012-01-16 DIAGNOSIS — M1710 Unilateral primary osteoarthritis, unspecified knee: Secondary | ICD-10-CM | POA: Diagnosis not present

## 2012-01-18 DIAGNOSIS — M545 Low back pain, unspecified: Secondary | ICD-10-CM | POA: Diagnosis not present

## 2012-01-18 DIAGNOSIS — M4120 Other idiopathic scoliosis, site unspecified: Secondary | ICD-10-CM | POA: Diagnosis not present

## 2012-01-18 DIAGNOSIS — M5137 Other intervertebral disc degeneration, lumbosacral region: Secondary | ICD-10-CM | POA: Diagnosis not present

## 2012-01-18 DIAGNOSIS — M533 Sacrococcygeal disorders, not elsewhere classified: Secondary | ICD-10-CM | POA: Diagnosis not present

## 2012-01-18 DIAGNOSIS — Z5181 Encounter for therapeutic drug level monitoring: Secondary | ICD-10-CM | POA: Diagnosis not present

## 2012-01-18 DIAGNOSIS — Z79899 Other long term (current) drug therapy: Secondary | ICD-10-CM | POA: Diagnosis not present

## 2012-01-21 ENCOUNTER — Other Ambulatory Visit (INDEPENDENT_AMBULATORY_CARE_PROVIDER_SITE_OTHER): Payer: Self-pay | Admitting: General Surgery

## 2012-01-22 DIAGNOSIS — I2699 Other pulmonary embolism without acute cor pulmonale: Secondary | ICD-10-CM | POA: Diagnosis not present

## 2012-01-22 DIAGNOSIS — Z7901 Long term (current) use of anticoagulants: Secondary | ICD-10-CM | POA: Diagnosis not present

## 2012-01-24 DIAGNOSIS — G43709 Chronic migraine without aura, not intractable, without status migrainosus: Secondary | ICD-10-CM | POA: Diagnosis not present

## 2012-01-29 DIAGNOSIS — N760 Acute vaginitis: Secondary | ICD-10-CM | POA: Diagnosis not present

## 2012-01-29 DIAGNOSIS — L821 Other seborrheic keratosis: Secondary | ICD-10-CM | POA: Diagnosis not present

## 2012-01-29 DIAGNOSIS — L538 Other specified erythematous conditions: Secondary | ICD-10-CM | POA: Diagnosis not present

## 2012-01-30 DIAGNOSIS — I2699 Other pulmonary embolism without acute cor pulmonale: Secondary | ICD-10-CM | POA: Diagnosis not present

## 2012-02-06 DIAGNOSIS — M1710 Unilateral primary osteoarthritis, unspecified knee: Secondary | ICD-10-CM | POA: Diagnosis not present

## 2012-02-06 DIAGNOSIS — I781 Nevus, non-neoplastic: Secondary | ICD-10-CM | POA: Diagnosis not present

## 2012-02-07 DIAGNOSIS — M25569 Pain in unspecified knee: Secondary | ICD-10-CM | POA: Diagnosis not present

## 2012-02-15 DIAGNOSIS — M545 Low back pain, unspecified: Secondary | ICD-10-CM | POA: Diagnosis not present

## 2012-02-15 DIAGNOSIS — M533 Sacrococcygeal disorders, not elsewhere classified: Secondary | ICD-10-CM | POA: Diagnosis not present

## 2012-02-15 DIAGNOSIS — IMO0002 Reserved for concepts with insufficient information to code with codable children: Secondary | ICD-10-CM | POA: Diagnosis not present

## 2012-02-15 DIAGNOSIS — M4120 Other idiopathic scoliosis, site unspecified: Secondary | ICD-10-CM | POA: Diagnosis not present

## 2012-02-21 DIAGNOSIS — H43399 Other vitreous opacities, unspecified eye: Secondary | ICD-10-CM | POA: Diagnosis not present

## 2012-02-21 DIAGNOSIS — H43819 Vitreous degeneration, unspecified eye: Secondary | ICD-10-CM | POA: Diagnosis not present

## 2012-02-21 DIAGNOSIS — I2699 Other pulmonary embolism without acute cor pulmonale: Secondary | ICD-10-CM | POA: Diagnosis not present

## 2012-02-21 DIAGNOSIS — H18519 Endothelial corneal dystrophy, unspecified eye: Secondary | ICD-10-CM | POA: Diagnosis not present

## 2012-02-21 DIAGNOSIS — H2510 Age-related nuclear cataract, unspecified eye: Secondary | ICD-10-CM | POA: Diagnosis not present

## 2012-02-21 DIAGNOSIS — Z7901 Long term (current) use of anticoagulants: Secondary | ICD-10-CM | POA: Diagnosis not present

## 2012-02-25 DIAGNOSIS — Z7901 Long term (current) use of anticoagulants: Secondary | ICD-10-CM | POA: Diagnosis not present

## 2012-03-03 DIAGNOSIS — G609 Hereditary and idiopathic neuropathy, unspecified: Secondary | ICD-10-CM | POA: Diagnosis not present

## 2012-03-03 DIAGNOSIS — E039 Hypothyroidism, unspecified: Secondary | ICD-10-CM | POA: Diagnosis not present

## 2012-03-03 DIAGNOSIS — G43909 Migraine, unspecified, not intractable, without status migrainosus: Secondary | ICD-10-CM | POA: Diagnosis not present

## 2012-03-03 DIAGNOSIS — M1710 Unilateral primary osteoarthritis, unspecified knee: Secondary | ICD-10-CM | POA: Diagnosis not present

## 2012-03-03 DIAGNOSIS — I959 Hypotension, unspecified: Secondary | ICD-10-CM | POA: Diagnosis not present

## 2012-03-03 DIAGNOSIS — Z6841 Body Mass Index (BMI) 40.0 and over, adult: Secondary | ICD-10-CM | POA: Diagnosis not present

## 2012-03-03 DIAGNOSIS — Z01818 Encounter for other preprocedural examination: Secondary | ICD-10-CM | POA: Diagnosis not present

## 2012-03-03 DIAGNOSIS — Z9981 Dependence on supplemental oxygen: Secondary | ICD-10-CM | POA: Diagnosis not present

## 2012-03-03 DIAGNOSIS — D62 Acute posthemorrhagic anemia: Secondary | ICD-10-CM | POA: Diagnosis not present

## 2012-03-11 DIAGNOSIS — R059 Cough, unspecified: Secondary | ICD-10-CM | POA: Diagnosis not present

## 2012-03-11 DIAGNOSIS — G609 Hereditary and idiopathic neuropathy, unspecified: Secondary | ICD-10-CM | POA: Diagnosis present

## 2012-03-11 DIAGNOSIS — G8918 Other acute postprocedural pain: Secondary | ICD-10-CM | POA: Diagnosis not present

## 2012-03-11 DIAGNOSIS — Z471 Aftercare following joint replacement surgery: Secondary | ICD-10-CM | POA: Diagnosis not present

## 2012-03-11 DIAGNOSIS — Z86718 Personal history of other venous thrombosis and embolism: Secondary | ICD-10-CM | POA: Diagnosis not present

## 2012-03-11 DIAGNOSIS — IMO0002 Reserved for concepts with insufficient information to code with codable children: Secondary | ICD-10-CM | POA: Diagnosis not present

## 2012-03-11 DIAGNOSIS — M1710 Unilateral primary osteoarthritis, unspecified knee: Secondary | ICD-10-CM | POA: Diagnosis not present

## 2012-03-11 DIAGNOSIS — J9819 Other pulmonary collapse: Secondary | ICD-10-CM | POA: Diagnosis not present

## 2012-03-11 DIAGNOSIS — D62 Acute posthemorrhagic anemia: Secondary | ICD-10-CM | POA: Diagnosis not present

## 2012-03-11 DIAGNOSIS — Z86711 Personal history of pulmonary embolism: Secondary | ICD-10-CM | POA: Diagnosis not present

## 2012-03-11 DIAGNOSIS — G43909 Migraine, unspecified, not intractable, without status migrainosus: Secondary | ICD-10-CM | POA: Diagnosis present

## 2012-03-11 DIAGNOSIS — M25569 Pain in unspecified knee: Secondary | ICD-10-CM | POA: Diagnosis not present

## 2012-03-11 DIAGNOSIS — R05 Cough: Secondary | ICD-10-CM | POA: Diagnosis not present

## 2012-03-11 DIAGNOSIS — G4733 Obstructive sleep apnea (adult) (pediatric): Secondary | ICD-10-CM | POA: Diagnosis not present

## 2012-03-11 DIAGNOSIS — Z9981 Dependence on supplemental oxygen: Secondary | ICD-10-CM | POA: Diagnosis not present

## 2012-03-11 DIAGNOSIS — Z96659 Presence of unspecified artificial knee joint: Secondary | ICD-10-CM | POA: Diagnosis not present

## 2012-03-11 DIAGNOSIS — R0902 Hypoxemia: Secondary | ICD-10-CM | POA: Diagnosis not present

## 2012-03-11 DIAGNOSIS — I959 Hypotension, unspecified: Secondary | ICD-10-CM | POA: Diagnosis not present

## 2012-03-11 DIAGNOSIS — E039 Hypothyroidism, unspecified: Secondary | ICD-10-CM | POA: Diagnosis present

## 2012-03-11 DIAGNOSIS — R918 Other nonspecific abnormal finding of lung field: Secondary | ICD-10-CM | POA: Diagnosis not present

## 2012-03-11 DIAGNOSIS — Z6841 Body Mass Index (BMI) 40.0 and over, adult: Secondary | ICD-10-CM | POA: Diagnosis not present

## 2012-03-17 DIAGNOSIS — F329 Major depressive disorder, single episode, unspecified: Secondary | ICD-10-CM | POA: Diagnosis not present

## 2012-03-17 DIAGNOSIS — IMO0001 Reserved for inherently not codable concepts without codable children: Secondary | ICD-10-CM | POA: Diagnosis not present

## 2012-03-17 DIAGNOSIS — M1710 Unilateral primary osteoarthritis, unspecified knee: Secondary | ICD-10-CM | POA: Diagnosis not present

## 2012-03-17 DIAGNOSIS — G609 Hereditary and idiopathic neuropathy, unspecified: Secondary | ICD-10-CM | POA: Diagnosis not present

## 2012-03-17 DIAGNOSIS — Z471 Aftercare following joint replacement surgery: Secondary | ICD-10-CM | POA: Diagnosis not present

## 2012-03-17 DIAGNOSIS — Z96659 Presence of unspecified artificial knee joint: Secondary | ICD-10-CM | POA: Diagnosis not present

## 2012-03-18 DIAGNOSIS — Z471 Aftercare following joint replacement surgery: Secondary | ICD-10-CM | POA: Diagnosis not present

## 2012-03-18 DIAGNOSIS — IMO0001 Reserved for inherently not codable concepts without codable children: Secondary | ICD-10-CM | POA: Diagnosis not present

## 2012-03-18 DIAGNOSIS — Z96659 Presence of unspecified artificial knee joint: Secondary | ICD-10-CM | POA: Diagnosis not present

## 2012-03-18 DIAGNOSIS — F329 Major depressive disorder, single episode, unspecified: Secondary | ICD-10-CM | POA: Diagnosis not present

## 2012-03-18 DIAGNOSIS — G609 Hereditary and idiopathic neuropathy, unspecified: Secondary | ICD-10-CM | POA: Diagnosis not present

## 2012-03-19 DIAGNOSIS — IMO0001 Reserved for inherently not codable concepts without codable children: Secondary | ICD-10-CM | POA: Diagnosis not present

## 2012-03-19 DIAGNOSIS — G609 Hereditary and idiopathic neuropathy, unspecified: Secondary | ICD-10-CM | POA: Diagnosis not present

## 2012-03-19 DIAGNOSIS — Z96659 Presence of unspecified artificial knee joint: Secondary | ICD-10-CM | POA: Diagnosis not present

## 2012-03-19 DIAGNOSIS — F329 Major depressive disorder, single episode, unspecified: Secondary | ICD-10-CM | POA: Diagnosis not present

## 2012-03-19 DIAGNOSIS — Z471 Aftercare following joint replacement surgery: Secondary | ICD-10-CM | POA: Diagnosis not present

## 2012-03-24 DIAGNOSIS — J40 Bronchitis, not specified as acute or chronic: Secondary | ICD-10-CM | POA: Diagnosis not present

## 2012-03-25 DIAGNOSIS — G609 Hereditary and idiopathic neuropathy, unspecified: Secondary | ICD-10-CM | POA: Diagnosis not present

## 2012-03-25 DIAGNOSIS — IMO0001 Reserved for inherently not codable concepts without codable children: Secondary | ICD-10-CM | POA: Diagnosis not present

## 2012-03-25 DIAGNOSIS — F329 Major depressive disorder, single episode, unspecified: Secondary | ICD-10-CM | POA: Diagnosis not present

## 2012-03-25 DIAGNOSIS — Z96659 Presence of unspecified artificial knee joint: Secondary | ICD-10-CM | POA: Diagnosis not present

## 2012-03-25 DIAGNOSIS — Z471 Aftercare following joint replacement surgery: Secondary | ICD-10-CM | POA: Diagnosis not present

## 2012-03-26 DIAGNOSIS — G43709 Chronic migraine without aura, not intractable, without status migrainosus: Secondary | ICD-10-CM | POA: Diagnosis not present

## 2012-03-26 DIAGNOSIS — Z96659 Presence of unspecified artificial knee joint: Secondary | ICD-10-CM | POA: Diagnosis not present

## 2012-03-26 DIAGNOSIS — G609 Hereditary and idiopathic neuropathy, unspecified: Secondary | ICD-10-CM | POA: Diagnosis not present

## 2012-03-26 DIAGNOSIS — Z471 Aftercare following joint replacement surgery: Secondary | ICD-10-CM | POA: Diagnosis not present

## 2012-03-26 DIAGNOSIS — F329 Major depressive disorder, single episode, unspecified: Secondary | ICD-10-CM | POA: Diagnosis not present

## 2012-03-26 DIAGNOSIS — Z7901 Long term (current) use of anticoagulants: Secondary | ICD-10-CM | POA: Diagnosis not present

## 2012-03-26 DIAGNOSIS — IMO0001 Reserved for inherently not codable concepts without codable children: Secondary | ICD-10-CM | POA: Diagnosis not present

## 2012-03-26 DIAGNOSIS — J45909 Unspecified asthma, uncomplicated: Secondary | ICD-10-CM | POA: Diagnosis not present

## 2012-03-27 DIAGNOSIS — Z96659 Presence of unspecified artificial knee joint: Secondary | ICD-10-CM | POA: Diagnosis not present

## 2012-03-27 DIAGNOSIS — IMO0001 Reserved for inherently not codable concepts without codable children: Secondary | ICD-10-CM | POA: Diagnosis not present

## 2012-03-27 DIAGNOSIS — G609 Hereditary and idiopathic neuropathy, unspecified: Secondary | ICD-10-CM | POA: Diagnosis not present

## 2012-03-27 DIAGNOSIS — F329 Major depressive disorder, single episode, unspecified: Secondary | ICD-10-CM | POA: Diagnosis not present

## 2012-03-27 DIAGNOSIS — Z471 Aftercare following joint replacement surgery: Secondary | ICD-10-CM | POA: Diagnosis not present

## 2012-03-28 DIAGNOSIS — F329 Major depressive disorder, single episode, unspecified: Secondary | ICD-10-CM | POA: Diagnosis not present

## 2012-03-28 DIAGNOSIS — M542 Cervicalgia: Secondary | ICD-10-CM | POA: Diagnosis not present

## 2012-03-28 DIAGNOSIS — Z471 Aftercare following joint replacement surgery: Secondary | ICD-10-CM | POA: Diagnosis not present

## 2012-03-28 DIAGNOSIS — IMO0001 Reserved for inherently not codable concepts without codable children: Secondary | ICD-10-CM | POA: Diagnosis not present

## 2012-03-28 DIAGNOSIS — M5137 Other intervertebral disc degeneration, lumbosacral region: Secondary | ICD-10-CM | POA: Diagnosis not present

## 2012-03-28 DIAGNOSIS — M545 Low back pain, unspecified: Secondary | ICD-10-CM | POA: Diagnosis not present

## 2012-03-28 DIAGNOSIS — Z96659 Presence of unspecified artificial knee joint: Secondary | ICD-10-CM | POA: Diagnosis not present

## 2012-03-28 DIAGNOSIS — G609 Hereditary and idiopathic neuropathy, unspecified: Secondary | ICD-10-CM | POA: Diagnosis not present

## 2012-03-28 DIAGNOSIS — IMO0002 Reserved for concepts with insufficient information to code with codable children: Secondary | ICD-10-CM | POA: Diagnosis not present

## 2012-03-31 DIAGNOSIS — Z471 Aftercare following joint replacement surgery: Secondary | ICD-10-CM | POA: Diagnosis not present

## 2012-03-31 DIAGNOSIS — IMO0001 Reserved for inherently not codable concepts without codable children: Secondary | ICD-10-CM | POA: Diagnosis not present

## 2012-03-31 DIAGNOSIS — G609 Hereditary and idiopathic neuropathy, unspecified: Secondary | ICD-10-CM | POA: Diagnosis not present

## 2012-03-31 DIAGNOSIS — F329 Major depressive disorder, single episode, unspecified: Secondary | ICD-10-CM | POA: Diagnosis not present

## 2012-03-31 DIAGNOSIS — Z96659 Presence of unspecified artificial knee joint: Secondary | ICD-10-CM | POA: Diagnosis not present

## 2012-04-01 DIAGNOSIS — I2699 Other pulmonary embolism without acute cor pulmonale: Secondary | ICD-10-CM | POA: Diagnosis not present

## 2012-04-01 DIAGNOSIS — Z7901 Long term (current) use of anticoagulants: Secondary | ICD-10-CM | POA: Diagnosis not present

## 2012-04-02 DIAGNOSIS — F329 Major depressive disorder, single episode, unspecified: Secondary | ICD-10-CM | POA: Diagnosis not present

## 2012-04-02 DIAGNOSIS — IMO0001 Reserved for inherently not codable concepts without codable children: Secondary | ICD-10-CM | POA: Diagnosis not present

## 2012-04-02 DIAGNOSIS — Z96659 Presence of unspecified artificial knee joint: Secondary | ICD-10-CM | POA: Diagnosis not present

## 2012-04-02 DIAGNOSIS — Z471 Aftercare following joint replacement surgery: Secondary | ICD-10-CM | POA: Diagnosis not present

## 2012-04-02 DIAGNOSIS — G609 Hereditary and idiopathic neuropathy, unspecified: Secondary | ICD-10-CM | POA: Diagnosis not present

## 2012-04-04 DIAGNOSIS — F329 Major depressive disorder, single episode, unspecified: Secondary | ICD-10-CM | POA: Diagnosis not present

## 2012-04-04 DIAGNOSIS — IMO0001 Reserved for inherently not codable concepts without codable children: Secondary | ICD-10-CM | POA: Diagnosis not present

## 2012-04-04 DIAGNOSIS — G609 Hereditary and idiopathic neuropathy, unspecified: Secondary | ICD-10-CM | POA: Diagnosis not present

## 2012-04-04 DIAGNOSIS — Z96659 Presence of unspecified artificial knee joint: Secondary | ICD-10-CM | POA: Diagnosis not present

## 2012-04-04 DIAGNOSIS — Z471 Aftercare following joint replacement surgery: Secondary | ICD-10-CM | POA: Diagnosis not present

## 2012-04-08 DIAGNOSIS — Z471 Aftercare following joint replacement surgery: Secondary | ICD-10-CM | POA: Diagnosis not present

## 2012-04-08 DIAGNOSIS — Z7901 Long term (current) use of anticoagulants: Secondary | ICD-10-CM | POA: Diagnosis not present

## 2012-04-08 DIAGNOSIS — F329 Major depressive disorder, single episode, unspecified: Secondary | ICD-10-CM | POA: Diagnosis not present

## 2012-04-08 DIAGNOSIS — I2699 Other pulmonary embolism without acute cor pulmonale: Secondary | ICD-10-CM | POA: Diagnosis not present

## 2012-04-08 DIAGNOSIS — IMO0001 Reserved for inherently not codable concepts without codable children: Secondary | ICD-10-CM | POA: Diagnosis not present

## 2012-04-08 DIAGNOSIS — G609 Hereditary and idiopathic neuropathy, unspecified: Secondary | ICD-10-CM | POA: Diagnosis not present

## 2012-04-08 DIAGNOSIS — Z96659 Presence of unspecified artificial knee joint: Secondary | ICD-10-CM | POA: Diagnosis not present

## 2012-04-09 DIAGNOSIS — Z96659 Presence of unspecified artificial knee joint: Secondary | ICD-10-CM | POA: Diagnosis not present

## 2012-04-09 DIAGNOSIS — G609 Hereditary and idiopathic neuropathy, unspecified: Secondary | ICD-10-CM | POA: Diagnosis not present

## 2012-04-09 DIAGNOSIS — IMO0001 Reserved for inherently not codable concepts without codable children: Secondary | ICD-10-CM | POA: Diagnosis not present

## 2012-04-09 DIAGNOSIS — F329 Major depressive disorder, single episode, unspecified: Secondary | ICD-10-CM | POA: Diagnosis not present

## 2012-04-09 DIAGNOSIS — Z471 Aftercare following joint replacement surgery: Secondary | ICD-10-CM | POA: Diagnosis not present

## 2012-04-10 DIAGNOSIS — F329 Major depressive disorder, single episode, unspecified: Secondary | ICD-10-CM | POA: Diagnosis not present

## 2012-04-10 DIAGNOSIS — Z471 Aftercare following joint replacement surgery: Secondary | ICD-10-CM | POA: Diagnosis not present

## 2012-04-10 DIAGNOSIS — Z96659 Presence of unspecified artificial knee joint: Secondary | ICD-10-CM | POA: Diagnosis not present

## 2012-04-10 DIAGNOSIS — IMO0001 Reserved for inherently not codable concepts without codable children: Secondary | ICD-10-CM | POA: Diagnosis not present

## 2012-04-10 DIAGNOSIS — G609 Hereditary and idiopathic neuropathy, unspecified: Secondary | ICD-10-CM | POA: Diagnosis not present

## 2012-04-11 DIAGNOSIS — Z09 Encounter for follow-up examination after completed treatment for conditions other than malignant neoplasm: Secondary | ICD-10-CM | POA: Diagnosis not present

## 2012-04-21 DIAGNOSIS — M5412 Radiculopathy, cervical region: Secondary | ICD-10-CM | POA: Diagnosis not present

## 2012-04-21 DIAGNOSIS — M9981 Other biomechanical lesions of cervical region: Secondary | ICD-10-CM | POA: Diagnosis not present

## 2012-04-22 DIAGNOSIS — Z7901 Long term (current) use of anticoagulants: Secondary | ICD-10-CM | POA: Diagnosis not present

## 2012-04-22 DIAGNOSIS — Z0000 Encounter for general adult medical examination without abnormal findings: Secondary | ICD-10-CM | POA: Diagnosis not present

## 2012-04-22 DIAGNOSIS — I2699 Other pulmonary embolism without acute cor pulmonale: Secondary | ICD-10-CM | POA: Diagnosis not present

## 2012-04-23 DIAGNOSIS — M1710 Unilateral primary osteoarthritis, unspecified knee: Secondary | ICD-10-CM | POA: Diagnosis not present

## 2012-04-23 DIAGNOSIS — R262 Difficulty in walking, not elsewhere classified: Secondary | ICD-10-CM | POA: Diagnosis not present

## 2012-04-25 DIAGNOSIS — M1710 Unilateral primary osteoarthritis, unspecified knee: Secondary | ICD-10-CM | POA: Diagnosis not present

## 2012-04-25 DIAGNOSIS — R262 Difficulty in walking, not elsewhere classified: Secondary | ICD-10-CM | POA: Diagnosis not present

## 2012-04-28 DIAGNOSIS — M1710 Unilateral primary osteoarthritis, unspecified knee: Secondary | ICD-10-CM | POA: Diagnosis not present

## 2012-04-28 DIAGNOSIS — R262 Difficulty in walking, not elsewhere classified: Secondary | ICD-10-CM | POA: Diagnosis not present

## 2012-05-06 DIAGNOSIS — R300 Dysuria: Secondary | ICD-10-CM | POA: Diagnosis not present

## 2012-05-06 DIAGNOSIS — I2699 Other pulmonary embolism without acute cor pulmonale: Secondary | ICD-10-CM | POA: Diagnosis not present

## 2012-05-06 DIAGNOSIS — Z7901 Long term (current) use of anticoagulants: Secondary | ICD-10-CM | POA: Diagnosis not present

## 2012-05-06 DIAGNOSIS — R350 Frequency of micturition: Secondary | ICD-10-CM | POA: Diagnosis not present

## 2012-05-09 DIAGNOSIS — R262 Difficulty in walking, not elsewhere classified: Secondary | ICD-10-CM | POA: Diagnosis not present

## 2012-05-09 DIAGNOSIS — M1710 Unilateral primary osteoarthritis, unspecified knee: Secondary | ICD-10-CM | POA: Diagnosis not present

## 2012-05-12 DIAGNOSIS — R262 Difficulty in walking, not elsewhere classified: Secondary | ICD-10-CM | POA: Diagnosis not present

## 2012-05-12 DIAGNOSIS — M1710 Unilateral primary osteoarthritis, unspecified knee: Secondary | ICD-10-CM | POA: Diagnosis not present

## 2012-05-14 DIAGNOSIS — M1710 Unilateral primary osteoarthritis, unspecified knee: Secondary | ICD-10-CM | POA: Diagnosis not present

## 2012-05-14 DIAGNOSIS — R262 Difficulty in walking, not elsewhere classified: Secondary | ICD-10-CM | POA: Diagnosis not present

## 2012-05-21 DIAGNOSIS — E7800 Pure hypercholesterolemia, unspecified: Secondary | ICD-10-CM | POA: Diagnosis not present

## 2012-05-21 DIAGNOSIS — F329 Major depressive disorder, single episode, unspecified: Secondary | ICD-10-CM | POA: Diagnosis not present

## 2012-05-21 DIAGNOSIS — E119 Type 2 diabetes mellitus without complications: Secondary | ICD-10-CM | POA: Diagnosis not present

## 2012-05-21 DIAGNOSIS — E039 Hypothyroidism, unspecified: Secondary | ICD-10-CM | POA: Diagnosis not present

## 2012-05-21 DIAGNOSIS — R634 Abnormal weight loss: Secondary | ICD-10-CM | POA: Diagnosis not present

## 2012-05-21 DIAGNOSIS — E0590 Thyrotoxicosis, unspecified without thyrotoxic crisis or storm: Secondary | ICD-10-CM | POA: Diagnosis not present

## 2012-05-21 DIAGNOSIS — R197 Diarrhea, unspecified: Secondary | ICD-10-CM | POA: Diagnosis not present

## 2012-06-04 DIAGNOSIS — R262 Difficulty in walking, not elsewhere classified: Secondary | ICD-10-CM | POA: Diagnosis not present

## 2012-06-04 DIAGNOSIS — M1710 Unilateral primary osteoarthritis, unspecified knee: Secondary | ICD-10-CM | POA: Diagnosis not present

## 2012-06-05 DIAGNOSIS — I2699 Other pulmonary embolism without acute cor pulmonale: Secondary | ICD-10-CM | POA: Diagnosis not present

## 2012-06-05 DIAGNOSIS — G252 Other specified forms of tremor: Secondary | ICD-10-CM | POA: Diagnosis not present

## 2012-06-05 DIAGNOSIS — G250 Essential tremor: Secondary | ICD-10-CM | POA: Diagnosis not present

## 2012-06-05 DIAGNOSIS — Z7901 Long term (current) use of anticoagulants: Secondary | ICD-10-CM | POA: Diagnosis not present

## 2012-06-05 DIAGNOSIS — G603 Idiopathic progressive neuropathy: Secondary | ICD-10-CM | POA: Diagnosis not present

## 2012-06-06 DIAGNOSIS — R262 Difficulty in walking, not elsewhere classified: Secondary | ICD-10-CM | POA: Diagnosis not present

## 2012-06-06 DIAGNOSIS — M1710 Unilateral primary osteoarthritis, unspecified knee: Secondary | ICD-10-CM | POA: Diagnosis not present

## 2012-06-20 DIAGNOSIS — J385 Laryngeal spasm: Secondary | ICD-10-CM | POA: Diagnosis not present

## 2012-06-23 DIAGNOSIS — R262 Difficulty in walking, not elsewhere classified: Secondary | ICD-10-CM | POA: Diagnosis not present

## 2012-06-23 DIAGNOSIS — M1710 Unilateral primary osteoarthritis, unspecified knee: Secondary | ICD-10-CM | POA: Diagnosis not present

## 2012-06-25 DIAGNOSIS — R262 Difficulty in walking, not elsewhere classified: Secondary | ICD-10-CM | POA: Diagnosis not present

## 2012-06-25 DIAGNOSIS — M1710 Unilateral primary osteoarthritis, unspecified knee: Secondary | ICD-10-CM | POA: Diagnosis not present

## 2012-06-26 DIAGNOSIS — M1710 Unilateral primary osteoarthritis, unspecified knee: Secondary | ICD-10-CM | POA: Diagnosis not present

## 2012-06-26 DIAGNOSIS — R262 Difficulty in walking, not elsewhere classified: Secondary | ICD-10-CM | POA: Diagnosis not present

## 2012-06-27 DIAGNOSIS — G43009 Migraine without aura, not intractable, without status migrainosus: Secondary | ICD-10-CM | POA: Diagnosis not present

## 2012-06-27 DIAGNOSIS — G250 Essential tremor: Secondary | ICD-10-CM | POA: Diagnosis not present

## 2012-06-30 DIAGNOSIS — M1710 Unilateral primary osteoarthritis, unspecified knee: Secondary | ICD-10-CM | POA: Diagnosis not present

## 2012-06-30 DIAGNOSIS — R262 Difficulty in walking, not elsewhere classified: Secondary | ICD-10-CM | POA: Diagnosis not present

## 2012-07-02 DIAGNOSIS — M1710 Unilateral primary osteoarthritis, unspecified knee: Secondary | ICD-10-CM | POA: Diagnosis not present

## 2012-07-02 DIAGNOSIS — R262 Difficulty in walking, not elsewhere classified: Secondary | ICD-10-CM | POA: Diagnosis not present

## 2012-07-03 DIAGNOSIS — R05 Cough: Secondary | ICD-10-CM | POA: Diagnosis not present

## 2012-07-03 DIAGNOSIS — J329 Chronic sinusitis, unspecified: Secondary | ICD-10-CM | POA: Diagnosis not present

## 2012-07-03 DIAGNOSIS — R059 Cough, unspecified: Secondary | ICD-10-CM | POA: Diagnosis not present

## 2012-07-04 DIAGNOSIS — M545 Low back pain, unspecified: Secondary | ICD-10-CM | POA: Diagnosis not present

## 2012-07-04 DIAGNOSIS — M549 Dorsalgia, unspecified: Secondary | ICD-10-CM | POA: Diagnosis not present

## 2012-07-14 DIAGNOSIS — M1710 Unilateral primary osteoarthritis, unspecified knee: Secondary | ICD-10-CM | POA: Diagnosis not present

## 2012-07-24 DIAGNOSIS — Z79899 Other long term (current) drug therapy: Secondary | ICD-10-CM | POA: Diagnosis not present

## 2012-07-24 DIAGNOSIS — M1990 Unspecified osteoarthritis, unspecified site: Secondary | ICD-10-CM | POA: Diagnosis not present

## 2012-07-24 DIAGNOSIS — G4733 Obstructive sleep apnea (adult) (pediatric): Secondary | ICD-10-CM | POA: Diagnosis not present

## 2012-07-24 DIAGNOSIS — Z86711 Personal history of pulmonary embolism: Secondary | ICD-10-CM | POA: Diagnosis not present

## 2012-07-24 DIAGNOSIS — Z880 Allergy status to penicillin: Secondary | ICD-10-CM | POA: Diagnosis not present

## 2012-07-24 DIAGNOSIS — Z9989 Dependence on other enabling machines and devices: Secondary | ICD-10-CM | POA: Diagnosis not present

## 2012-07-24 DIAGNOSIS — Z885 Allergy status to narcotic agent status: Secondary | ICD-10-CM | POA: Diagnosis not present

## 2012-07-24 DIAGNOSIS — E669 Obesity, unspecified: Secondary | ICD-10-CM | POA: Diagnosis not present

## 2012-07-24 DIAGNOSIS — K589 Irritable bowel syndrome without diarrhea: Secondary | ICD-10-CM | POA: Diagnosis not present

## 2012-07-24 DIAGNOSIS — R259 Unspecified abnormal involuntary movements: Secondary | ICD-10-CM | POA: Diagnosis not present

## 2012-07-24 DIAGNOSIS — Z7901 Long term (current) use of anticoagulants: Secondary | ICD-10-CM | POA: Diagnosis not present

## 2012-07-24 DIAGNOSIS — M533 Sacrococcygeal disorders, not elsewhere classified: Secondary | ICD-10-CM | POA: Diagnosis not present

## 2012-07-25 DIAGNOSIS — Z7901 Long term (current) use of anticoagulants: Secondary | ICD-10-CM | POA: Diagnosis not present

## 2012-07-25 DIAGNOSIS — Z9989 Dependence on other enabling machines and devices: Secondary | ICD-10-CM | POA: Diagnosis not present

## 2012-07-25 DIAGNOSIS — Z0189 Encounter for other specified special examinations: Secondary | ICD-10-CM | POA: Diagnosis not present

## 2012-07-25 DIAGNOSIS — Z86711 Personal history of pulmonary embolism: Secondary | ICD-10-CM | POA: Diagnosis not present

## 2012-07-25 DIAGNOSIS — E669 Obesity, unspecified: Secondary | ICD-10-CM | POA: Diagnosis not present

## 2012-07-25 DIAGNOSIS — G4733 Obstructive sleep apnea (adult) (pediatric): Secondary | ICD-10-CM | POA: Diagnosis not present

## 2012-07-25 DIAGNOSIS — M533 Sacrococcygeal disorders, not elsewhere classified: Secondary | ICD-10-CM | POA: Diagnosis not present

## 2012-08-29 DIAGNOSIS — M533 Sacrococcygeal disorders, not elsewhere classified: Secondary | ICD-10-CM | POA: Diagnosis not present

## 2012-08-29 DIAGNOSIS — M545 Low back pain, unspecified: Secondary | ICD-10-CM | POA: Diagnosis not present

## 2012-08-29 DIAGNOSIS — IMO0002 Reserved for concepts with insufficient information to code with codable children: Secondary | ICD-10-CM | POA: Diagnosis not present

## 2012-09-03 DIAGNOSIS — M533 Sacrococcygeal disorders, not elsewhere classified: Secondary | ICD-10-CM | POA: Insufficient documentation

## 2012-09-03 DIAGNOSIS — M4120 Other idiopathic scoliosis, site unspecified: Secondary | ICD-10-CM | POA: Insufficient documentation

## 2012-09-03 DIAGNOSIS — M5136 Other intervertebral disc degeneration, lumbar region: Secondary | ICD-10-CM | POA: Insufficient documentation

## 2012-09-03 DIAGNOSIS — M48061 Spinal stenosis, lumbar region without neurogenic claudication: Secondary | ICD-10-CM | POA: Insufficient documentation

## 2012-09-26 DIAGNOSIS — Z7982 Long term (current) use of aspirin: Secondary | ICD-10-CM | POA: Diagnosis not present

## 2012-09-26 DIAGNOSIS — Z888 Allergy status to other drugs, medicaments and biological substances status: Secondary | ICD-10-CM | POA: Diagnosis not present

## 2012-09-26 DIAGNOSIS — Z880 Allergy status to penicillin: Secondary | ICD-10-CM | POA: Diagnosis not present

## 2012-09-26 DIAGNOSIS — Z79899 Other long term (current) drug therapy: Secondary | ICD-10-CM | POA: Diagnosis not present

## 2012-09-26 DIAGNOSIS — M533 Sacrococcygeal disorders, not elsewhere classified: Secondary | ICD-10-CM | POA: Diagnosis not present

## 2012-09-26 DIAGNOSIS — Z0189 Encounter for other specified special examinations: Secondary | ICD-10-CM | POA: Diagnosis not present

## 2012-09-26 DIAGNOSIS — I10 Essential (primary) hypertension: Secondary | ICD-10-CM | POA: Diagnosis not present

## 2012-09-26 DIAGNOSIS — E669 Obesity, unspecified: Secondary | ICD-10-CM | POA: Diagnosis not present

## 2012-09-26 DIAGNOSIS — Z885 Allergy status to narcotic agent status: Secondary | ICD-10-CM | POA: Diagnosis not present

## 2012-09-26 DIAGNOSIS — Z86711 Personal history of pulmonary embolism: Secondary | ICD-10-CM | POA: Diagnosis not present

## 2012-10-02 DIAGNOSIS — G43009 Migraine without aura, not intractable, without status migrainosus: Secondary | ICD-10-CM | POA: Diagnosis not present

## 2012-10-02 DIAGNOSIS — G252 Other specified forms of tremor: Secondary | ICD-10-CM | POA: Diagnosis not present

## 2012-10-02 DIAGNOSIS — G250 Essential tremor: Secondary | ICD-10-CM | POA: Diagnosis not present

## 2012-10-17 DIAGNOSIS — J385 Laryngeal spasm: Secondary | ICD-10-CM | POA: Diagnosis not present

## 2012-10-24 DIAGNOSIS — Z5181 Encounter for therapeutic drug level monitoring: Secondary | ICD-10-CM | POA: Diagnosis not present

## 2012-10-24 DIAGNOSIS — IMO0002 Reserved for concepts with insufficient information to code with codable children: Secondary | ICD-10-CM | POA: Diagnosis not present

## 2012-10-24 DIAGNOSIS — IMO0001 Reserved for inherently not codable concepts without codable children: Secondary | ICD-10-CM | POA: Diagnosis not present

## 2012-10-24 DIAGNOSIS — Z79899 Other long term (current) drug therapy: Secondary | ICD-10-CM | POA: Diagnosis not present

## 2012-10-24 DIAGNOSIS — G894 Chronic pain syndrome: Secondary | ICD-10-CM | POA: Diagnosis not present

## 2012-10-24 DIAGNOSIS — M533 Sacrococcygeal disorders, not elsewhere classified: Secondary | ICD-10-CM | POA: Diagnosis not present

## 2012-10-28 DIAGNOSIS — G894 Chronic pain syndrome: Secondary | ICD-10-CM | POA: Insufficient documentation

## 2012-11-05 DIAGNOSIS — M5412 Radiculopathy, cervical region: Secondary | ICD-10-CM | POA: Diagnosis not present

## 2012-11-05 DIAGNOSIS — M9981 Other biomechanical lesions of cervical region: Secondary | ICD-10-CM | POA: Diagnosis not present

## 2012-11-10 DIAGNOSIS — R262 Difficulty in walking, not elsewhere classified: Secondary | ICD-10-CM | POA: Diagnosis not present

## 2012-11-10 DIAGNOSIS — M545 Low back pain, unspecified: Secondary | ICD-10-CM | POA: Diagnosis not present

## 2012-11-10 DIAGNOSIS — M5412 Radiculopathy, cervical region: Secondary | ICD-10-CM | POA: Diagnosis not present

## 2012-11-10 DIAGNOSIS — M9981 Other biomechanical lesions of cervical region: Secondary | ICD-10-CM | POA: Diagnosis not present

## 2012-11-20 DIAGNOSIS — M533 Sacrococcygeal disorders, not elsewhere classified: Secondary | ICD-10-CM | POA: Diagnosis not present

## 2012-11-21 DIAGNOSIS — J385 Laryngeal spasm: Secondary | ICD-10-CM | POA: Diagnosis not present

## 2012-11-24 DIAGNOSIS — M25819 Other specified joint disorders, unspecified shoulder: Secondary | ICD-10-CM | POA: Diagnosis not present

## 2012-11-24 DIAGNOSIS — M1710 Unilateral primary osteoarthritis, unspecified knee: Secondary | ICD-10-CM | POA: Diagnosis not present

## 2012-11-25 DIAGNOSIS — Z7901 Long term (current) use of anticoagulants: Secondary | ICD-10-CM | POA: Diagnosis not present

## 2012-11-25 DIAGNOSIS — I2699 Other pulmonary embolism without acute cor pulmonale: Secondary | ICD-10-CM | POA: Diagnosis not present

## 2012-12-19 DIAGNOSIS — M25519 Pain in unspecified shoulder: Secondary | ICD-10-CM | POA: Diagnosis not present

## 2012-12-19 DIAGNOSIS — G894 Chronic pain syndrome: Secondary | ICD-10-CM | POA: Diagnosis not present

## 2012-12-27 DIAGNOSIS — R0989 Other specified symptoms and signs involving the circulatory and respiratory systems: Secondary | ICD-10-CM | POA: Diagnosis not present

## 2012-12-27 DIAGNOSIS — Z888 Allergy status to other drugs, medicaments and biological substances status: Secondary | ICD-10-CM | POA: Diagnosis not present

## 2012-12-27 DIAGNOSIS — Z882 Allergy status to sulfonamides status: Secondary | ICD-10-CM | POA: Diagnosis not present

## 2012-12-27 DIAGNOSIS — Z880 Allergy status to penicillin: Secondary | ICD-10-CM | POA: Diagnosis not present

## 2012-12-27 DIAGNOSIS — R5383 Other fatigue: Secondary | ICD-10-CM | POA: Diagnosis not present

## 2012-12-27 DIAGNOSIS — Z79899 Other long term (current) drug therapy: Secondary | ICD-10-CM | POA: Diagnosis not present

## 2012-12-27 DIAGNOSIS — R0602 Shortness of breath: Secondary | ICD-10-CM | POA: Diagnosis not present

## 2012-12-27 DIAGNOSIS — R0789 Other chest pain: Secondary | ICD-10-CM | POA: Diagnosis not present

## 2012-12-27 DIAGNOSIS — R5381 Other malaise: Secondary | ICD-10-CM | POA: Diagnosis not present

## 2012-12-27 DIAGNOSIS — R0609 Other forms of dyspnea: Secondary | ICD-10-CM | POA: Diagnosis not present

## 2012-12-27 DIAGNOSIS — R51 Headache: Secondary | ICD-10-CM | POA: Diagnosis not present

## 2012-12-31 DIAGNOSIS — G43009 Migraine without aura, not intractable, without status migrainosus: Secondary | ICD-10-CM | POA: Diagnosis not present

## 2012-12-31 DIAGNOSIS — I959 Hypotension, unspecified: Secondary | ICD-10-CM | POA: Diagnosis not present

## 2012-12-31 DIAGNOSIS — G250 Essential tremor: Secondary | ICD-10-CM | POA: Diagnosis not present

## 2012-12-31 DIAGNOSIS — E669 Obesity, unspecified: Secondary | ICD-10-CM | POA: Diagnosis not present

## 2012-12-31 DIAGNOSIS — G252 Other specified forms of tremor: Secondary | ICD-10-CM | POA: Diagnosis not present

## 2013-01-05 DIAGNOSIS — M5412 Radiculopathy, cervical region: Secondary | ICD-10-CM | POA: Diagnosis not present

## 2013-01-05 DIAGNOSIS — M9981 Other biomechanical lesions of cervical region: Secondary | ICD-10-CM | POA: Diagnosis not present

## 2013-01-06 DIAGNOSIS — I959 Hypotension, unspecified: Secondary | ICD-10-CM | POA: Diagnosis not present

## 2013-01-07 DIAGNOSIS — L255 Unspecified contact dermatitis due to plants, except food: Secondary | ICD-10-CM | POA: Diagnosis not present

## 2013-01-07 DIAGNOSIS — J329 Chronic sinusitis, unspecified: Secondary | ICD-10-CM | POA: Diagnosis not present

## 2013-01-12 DIAGNOSIS — M25419 Effusion, unspecified shoulder: Secondary | ICD-10-CM | POA: Diagnosis not present

## 2013-01-12 DIAGNOSIS — M19019 Primary osteoarthritis, unspecified shoulder: Secondary | ICD-10-CM | POA: Diagnosis not present

## 2013-01-12 DIAGNOSIS — M719 Bursopathy, unspecified: Secondary | ICD-10-CM | POA: Diagnosis not present

## 2013-01-12 DIAGNOSIS — M67919 Unspecified disorder of synovium and tendon, unspecified shoulder: Secondary | ICD-10-CM | POA: Diagnosis not present

## 2013-01-13 DIAGNOSIS — R635 Abnormal weight gain: Secondary | ICD-10-CM | POA: Diagnosis not present

## 2013-01-13 DIAGNOSIS — K589 Irritable bowel syndrome without diarrhea: Secondary | ICD-10-CM | POA: Diagnosis not present

## 2013-01-13 DIAGNOSIS — R1319 Other dysphagia: Secondary | ICD-10-CM | POA: Diagnosis not present

## 2013-01-13 DIAGNOSIS — R141 Gas pain: Secondary | ICD-10-CM | POA: Diagnosis not present

## 2013-01-13 DIAGNOSIS — R142 Eructation: Secondary | ICD-10-CM | POA: Diagnosis not present

## 2013-01-23 DIAGNOSIS — M533 Sacrococcygeal disorders, not elsewhere classified: Secondary | ICD-10-CM | POA: Diagnosis not present

## 2013-01-23 DIAGNOSIS — Z538 Procedure and treatment not carried out for other reasons: Secondary | ICD-10-CM | POA: Diagnosis not present

## 2013-01-23 DIAGNOSIS — Z880 Allergy status to penicillin: Secondary | ICD-10-CM | POA: Diagnosis not present

## 2013-01-23 DIAGNOSIS — Z885 Allergy status to narcotic agent status: Secondary | ICD-10-CM | POA: Diagnosis not present

## 2013-01-30 DIAGNOSIS — M533 Sacrococcygeal disorders, not elsewhere classified: Secondary | ICD-10-CM | POA: Diagnosis not present

## 2013-01-30 DIAGNOSIS — G894 Chronic pain syndrome: Secondary | ICD-10-CM | POA: Diagnosis not present

## 2013-02-02 DIAGNOSIS — M19019 Primary osteoarthritis, unspecified shoulder: Secondary | ICD-10-CM | POA: Diagnosis not present

## 2013-02-02 DIAGNOSIS — M1710 Unilateral primary osteoarthritis, unspecified knee: Secondary | ICD-10-CM | POA: Diagnosis not present

## 2013-02-11 DIAGNOSIS — G894 Chronic pain syndrome: Secondary | ICD-10-CM | POA: Diagnosis not present

## 2013-02-11 DIAGNOSIS — R5383 Other fatigue: Secondary | ICD-10-CM | POA: Diagnosis not present

## 2013-02-11 DIAGNOSIS — R635 Abnormal weight gain: Secondary | ICD-10-CM | POA: Diagnosis not present

## 2013-02-11 DIAGNOSIS — E559 Vitamin D deficiency, unspecified: Secondary | ICD-10-CM | POA: Diagnosis not present

## 2013-02-11 DIAGNOSIS — Z6841 Body Mass Index (BMI) 40.0 and over, adult: Secondary | ICD-10-CM | POA: Diagnosis not present

## 2013-02-11 DIAGNOSIS — F329 Major depressive disorder, single episode, unspecified: Secondary | ICD-10-CM | POA: Diagnosis not present

## 2013-02-11 DIAGNOSIS — R5381 Other malaise: Secondary | ICD-10-CM | POA: Diagnosis not present

## 2013-02-12 DIAGNOSIS — M545 Low back pain, unspecified: Secondary | ICD-10-CM | POA: Diagnosis not present

## 2013-02-12 DIAGNOSIS — M1710 Unilateral primary osteoarthritis, unspecified knee: Secondary | ICD-10-CM | POA: Diagnosis not present

## 2013-02-16 DIAGNOSIS — I781 Nevus, non-neoplastic: Secondary | ICD-10-CM | POA: Diagnosis not present

## 2013-02-19 DIAGNOSIS — M545 Low back pain, unspecified: Secondary | ICD-10-CM | POA: Diagnosis not present

## 2013-02-19 DIAGNOSIS — M1710 Unilateral primary osteoarthritis, unspecified knee: Secondary | ICD-10-CM | POA: Diagnosis not present

## 2013-02-20 DIAGNOSIS — M533 Sacrococcygeal disorders, not elsewhere classified: Secondary | ICD-10-CM | POA: Diagnosis not present

## 2013-02-20 DIAGNOSIS — M25569 Pain in unspecified knee: Secondary | ICD-10-CM | POA: Diagnosis not present

## 2013-02-20 DIAGNOSIS — G894 Chronic pain syndrome: Secondary | ICD-10-CM | POA: Diagnosis not present

## 2013-02-20 DIAGNOSIS — IMO0002 Reserved for concepts with insufficient information to code with codable children: Secondary | ICD-10-CM | POA: Diagnosis not present

## 2013-02-20 DIAGNOSIS — M545 Low back pain, unspecified: Secondary | ICD-10-CM | POA: Diagnosis not present

## 2013-02-20 DIAGNOSIS — M1710 Unilateral primary osteoarthritis, unspecified knee: Secondary | ICD-10-CM | POA: Diagnosis not present

## 2013-02-25 DIAGNOSIS — L719 Rosacea, unspecified: Secondary | ICD-10-CM | POA: Diagnosis not present

## 2013-02-25 DIAGNOSIS — H18519 Endothelial corneal dystrophy, unspecified eye: Secondary | ICD-10-CM | POA: Diagnosis not present

## 2013-02-25 DIAGNOSIS — H2510 Age-related nuclear cataract, unspecified eye: Secondary | ICD-10-CM | POA: Diagnosis not present

## 2013-02-25 DIAGNOSIS — H524 Presbyopia: Secondary | ICD-10-CM | POA: Diagnosis not present

## 2013-02-25 DIAGNOSIS — H43819 Vitreous degeneration, unspecified eye: Secondary | ICD-10-CM | POA: Diagnosis not present

## 2013-02-25 DIAGNOSIS — H538 Other visual disturbances: Secondary | ICD-10-CM | POA: Diagnosis not present

## 2013-02-25 DIAGNOSIS — H35039 Hypertensive retinopathy, unspecified eye: Secondary | ICD-10-CM | POA: Diagnosis not present

## 2013-03-05 DIAGNOSIS — M533 Sacrococcygeal disorders, not elsewhere classified: Secondary | ICD-10-CM | POA: Diagnosis not present

## 2013-03-05 DIAGNOSIS — G894 Chronic pain syndrome: Secondary | ICD-10-CM | POA: Diagnosis not present

## 2013-03-10 DIAGNOSIS — G43009 Migraine without aura, not intractable, without status migrainosus: Secondary | ICD-10-CM | POA: Diagnosis not present

## 2013-03-10 DIAGNOSIS — G252 Other specified forms of tremor: Secondary | ICD-10-CM | POA: Diagnosis not present

## 2013-03-10 DIAGNOSIS — G43709 Chronic migraine without aura, not intractable, without status migrainosus: Secondary | ICD-10-CM | POA: Diagnosis not present

## 2013-03-10 DIAGNOSIS — G250 Essential tremor: Secondary | ICD-10-CM | POA: Diagnosis not present

## 2013-03-11 DIAGNOSIS — M9981 Other biomechanical lesions of cervical region: Secondary | ICD-10-CM | POA: Diagnosis not present

## 2013-03-11 DIAGNOSIS — H2510 Age-related nuclear cataract, unspecified eye: Secondary | ICD-10-CM | POA: Diagnosis not present

## 2013-03-11 DIAGNOSIS — M5412 Radiculopathy, cervical region: Secondary | ICD-10-CM | POA: Diagnosis not present

## 2013-03-12 DIAGNOSIS — M5412 Radiculopathy, cervical region: Secondary | ICD-10-CM | POA: Diagnosis not present

## 2013-03-12 DIAGNOSIS — M9981 Other biomechanical lesions of cervical region: Secondary | ICD-10-CM | POA: Diagnosis not present

## 2013-03-16 DIAGNOSIS — M5412 Radiculopathy, cervical region: Secondary | ICD-10-CM | POA: Diagnosis not present

## 2013-03-16 DIAGNOSIS — M9981 Other biomechanical lesions of cervical region: Secondary | ICD-10-CM | POA: Diagnosis not present

## 2013-03-17 DIAGNOSIS — H269 Unspecified cataract: Secondary | ICD-10-CM | POA: Diagnosis not present

## 2013-03-17 DIAGNOSIS — H2510 Age-related nuclear cataract, unspecified eye: Secondary | ICD-10-CM | POA: Diagnosis not present

## 2013-03-17 HISTORY — PX: CATARACT EXTRACTION, BILATERAL: SHX1313

## 2013-03-25 DIAGNOSIS — M1710 Unilateral primary osteoarthritis, unspecified knee: Secondary | ICD-10-CM | POA: Diagnosis not present

## 2013-03-25 DIAGNOSIS — Z6841 Body Mass Index (BMI) 40.0 and over, adult: Secondary | ICD-10-CM | POA: Diagnosis not present

## 2013-03-25 DIAGNOSIS — F411 Generalized anxiety disorder: Secondary | ICD-10-CM | POA: Diagnosis not present

## 2013-03-25 DIAGNOSIS — F329 Major depressive disorder, single episode, unspecified: Secondary | ICD-10-CM | POA: Diagnosis not present

## 2013-04-06 DIAGNOSIS — H2510 Age-related nuclear cataract, unspecified eye: Secondary | ICD-10-CM | POA: Diagnosis not present

## 2013-04-07 DIAGNOSIS — M5412 Radiculopathy, cervical region: Secondary | ICD-10-CM | POA: Diagnosis not present

## 2013-04-07 DIAGNOSIS — M9981 Other biomechanical lesions of cervical region: Secondary | ICD-10-CM | POA: Diagnosis not present

## 2013-04-13 DIAGNOSIS — M5412 Radiculopathy, cervical region: Secondary | ICD-10-CM | POA: Diagnosis not present

## 2013-04-13 DIAGNOSIS — M9981 Other biomechanical lesions of cervical region: Secondary | ICD-10-CM | POA: Diagnosis not present

## 2013-04-14 DIAGNOSIS — H2510 Age-related nuclear cataract, unspecified eye: Secondary | ICD-10-CM | POA: Diagnosis not present

## 2013-04-14 DIAGNOSIS — H269 Unspecified cataract: Secondary | ICD-10-CM | POA: Diagnosis not present

## 2013-04-15 DIAGNOSIS — M9981 Other biomechanical lesions of cervical region: Secondary | ICD-10-CM | POA: Diagnosis not present

## 2013-04-15 DIAGNOSIS — M5412 Radiculopathy, cervical region: Secondary | ICD-10-CM | POA: Diagnosis not present

## 2013-04-16 DIAGNOSIS — IMO0001 Reserved for inherently not codable concepts without codable children: Secondary | ICD-10-CM | POA: Diagnosis not present

## 2013-04-16 DIAGNOSIS — M545 Low back pain, unspecified: Secondary | ICD-10-CM | POA: Diagnosis not present

## 2013-04-16 DIAGNOSIS — M47817 Spondylosis without myelopathy or radiculopathy, lumbosacral region: Secondary | ICD-10-CM | POA: Diagnosis not present

## 2013-04-16 DIAGNOSIS — G894 Chronic pain syndrome: Secondary | ICD-10-CM | POA: Diagnosis not present

## 2013-04-16 DIAGNOSIS — M7989 Other specified soft tissue disorders: Secondary | ICD-10-CM | POA: Diagnosis not present

## 2013-04-20 DIAGNOSIS — M545 Low back pain, unspecified: Secondary | ICD-10-CM | POA: Diagnosis not present

## 2013-04-20 DIAGNOSIS — M1710 Unilateral primary osteoarthritis, unspecified knee: Secondary | ICD-10-CM | POA: Diagnosis not present

## 2013-04-21 DIAGNOSIS — H02839 Dermatochalasis of unspecified eye, unspecified eyelid: Secondary | ICD-10-CM | POA: Diagnosis not present

## 2013-04-21 DIAGNOSIS — L821 Other seborrheic keratosis: Secondary | ICD-10-CM | POA: Diagnosis not present

## 2013-04-21 DIAGNOSIS — L819 Disorder of pigmentation, unspecified: Secondary | ICD-10-CM | POA: Diagnosis not present

## 2013-04-21 DIAGNOSIS — L908 Other atrophic disorders of skin: Secondary | ICD-10-CM | POA: Diagnosis not present

## 2013-04-23 DIAGNOSIS — M1710 Unilateral primary osteoarthritis, unspecified knee: Secondary | ICD-10-CM | POA: Diagnosis not present

## 2013-04-23 DIAGNOSIS — M545 Low back pain, unspecified: Secondary | ICD-10-CM | POA: Diagnosis not present

## 2013-04-28 DIAGNOSIS — M6281 Muscle weakness (generalized): Secondary | ICD-10-CM | POA: Diagnosis not present

## 2013-04-28 DIAGNOSIS — G589 Mononeuropathy, unspecified: Secondary | ICD-10-CM | POA: Diagnosis not present

## 2013-04-28 DIAGNOSIS — M2550 Pain in unspecified joint: Secondary | ICD-10-CM | POA: Diagnosis not present

## 2013-04-28 DIAGNOSIS — E538 Deficiency of other specified B group vitamins: Secondary | ICD-10-CM | POA: Diagnosis not present

## 2013-04-28 DIAGNOSIS — R5381 Other malaise: Secondary | ICD-10-CM | POA: Diagnosis not present

## 2013-04-28 DIAGNOSIS — R5383 Other fatigue: Secondary | ICD-10-CM | POA: Diagnosis not present

## 2013-04-28 DIAGNOSIS — IMO0001 Reserved for inherently not codable concepts without codable children: Secondary | ICD-10-CM | POA: Diagnosis not present

## 2013-04-29 DIAGNOSIS — M1710 Unilateral primary osteoarthritis, unspecified knee: Secondary | ICD-10-CM | POA: Diagnosis not present

## 2013-04-29 DIAGNOSIS — M545 Low back pain, unspecified: Secondary | ICD-10-CM | POA: Diagnosis not present

## 2013-05-01 DIAGNOSIS — M545 Low back pain, unspecified: Secondary | ICD-10-CM | POA: Diagnosis not present

## 2013-05-01 DIAGNOSIS — M1710 Unilateral primary osteoarthritis, unspecified knee: Secondary | ICD-10-CM | POA: Diagnosis not present

## 2013-05-05 DIAGNOSIS — M545 Low back pain, unspecified: Secondary | ICD-10-CM | POA: Diagnosis not present

## 2013-05-05 DIAGNOSIS — M1710 Unilateral primary osteoarthritis, unspecified knee: Secondary | ICD-10-CM | POA: Diagnosis not present

## 2013-05-06 DIAGNOSIS — F54 Psychological and behavioral factors associated with disorders or diseases classified elsewhere: Secondary | ICD-10-CM | POA: Diagnosis not present

## 2013-05-07 DIAGNOSIS — D649 Anemia, unspecified: Secondary | ICD-10-CM | POA: Diagnosis not present

## 2013-05-07 DIAGNOSIS — Z885 Allergy status to narcotic agent status: Secondary | ICD-10-CM | POA: Diagnosis not present

## 2013-05-07 DIAGNOSIS — M129 Arthropathy, unspecified: Secondary | ICD-10-CM | POA: Diagnosis not present

## 2013-05-07 DIAGNOSIS — E669 Obesity, unspecified: Secondary | ICD-10-CM | POA: Diagnosis not present

## 2013-05-07 DIAGNOSIS — Z6832 Body mass index (BMI) 32.0-32.9, adult: Secondary | ICD-10-CM | POA: Diagnosis not present

## 2013-05-07 DIAGNOSIS — G8929 Other chronic pain: Secondary | ICD-10-CM | POA: Diagnosis not present

## 2013-05-07 DIAGNOSIS — Z91038 Other insect allergy status: Secondary | ICD-10-CM | POA: Diagnosis not present

## 2013-05-07 DIAGNOSIS — M7989 Other specified soft tissue disorders: Secondary | ICD-10-CM | POA: Diagnosis not present

## 2013-05-07 DIAGNOSIS — Z881 Allergy status to other antibiotic agents status: Secondary | ICD-10-CM | POA: Diagnosis not present

## 2013-05-07 DIAGNOSIS — G4733 Obstructive sleep apnea (adult) (pediatric): Secondary | ICD-10-CM | POA: Diagnosis not present

## 2013-05-07 DIAGNOSIS — Z886 Allergy status to analgesic agent status: Secondary | ICD-10-CM | POA: Diagnosis not present

## 2013-05-08 DIAGNOSIS — M545 Low back pain, unspecified: Secondary | ICD-10-CM | POA: Diagnosis not present

## 2013-05-08 DIAGNOSIS — M1710 Unilateral primary osteoarthritis, unspecified knee: Secondary | ICD-10-CM | POA: Diagnosis not present

## 2013-05-11 DIAGNOSIS — G43709 Chronic migraine without aura, not intractable, without status migrainosus: Secondary | ICD-10-CM | POA: Diagnosis not present

## 2013-05-13 DIAGNOSIS — M545 Low back pain, unspecified: Secondary | ICD-10-CM | POA: Diagnosis not present

## 2013-05-13 DIAGNOSIS — M1710 Unilateral primary osteoarthritis, unspecified knee: Secondary | ICD-10-CM | POA: Diagnosis not present

## 2013-05-14 DIAGNOSIS — M1710 Unilateral primary osteoarthritis, unspecified knee: Secondary | ICD-10-CM | POA: Diagnosis not present

## 2013-05-14 DIAGNOSIS — M545 Low back pain, unspecified: Secondary | ICD-10-CM | POA: Diagnosis not present

## 2013-05-19 DIAGNOSIS — M545 Low back pain, unspecified: Secondary | ICD-10-CM | POA: Diagnosis not present

## 2013-05-19 DIAGNOSIS — M1710 Unilateral primary osteoarthritis, unspecified knee: Secondary | ICD-10-CM | POA: Diagnosis not present

## 2013-05-20 DIAGNOSIS — M2550 Pain in unspecified joint: Secondary | ICD-10-CM | POA: Diagnosis not present

## 2013-05-20 DIAGNOSIS — R894 Abnormal immunological findings in specimens from other organs, systems and tissues: Secondary | ICD-10-CM | POA: Diagnosis not present

## 2013-05-20 DIAGNOSIS — E538 Deficiency of other specified B group vitamins: Secondary | ICD-10-CM | POA: Diagnosis not present

## 2013-05-20 DIAGNOSIS — M1990 Unspecified osteoarthritis, unspecified site: Secondary | ICD-10-CM | POA: Diagnosis not present

## 2013-05-21 DIAGNOSIS — G4733 Obstructive sleep apnea (adult) (pediatric): Secondary | ICD-10-CM | POA: Diagnosis not present

## 2013-05-22 DIAGNOSIS — M545 Low back pain, unspecified: Secondary | ICD-10-CM | POA: Diagnosis not present

## 2013-05-22 DIAGNOSIS — M533 Sacrococcygeal disorders, not elsewhere classified: Secondary | ICD-10-CM | POA: Diagnosis not present

## 2013-05-22 DIAGNOSIS — G894 Chronic pain syndrome: Secondary | ICD-10-CM | POA: Diagnosis not present

## 2013-05-22 DIAGNOSIS — M5137 Other intervertebral disc degeneration, lumbosacral region: Secondary | ICD-10-CM | POA: Diagnosis not present

## 2013-05-23 DIAGNOSIS — E669 Obesity, unspecified: Secondary | ICD-10-CM | POA: Diagnosis not present

## 2013-05-23 DIAGNOSIS — Z885 Allergy status to narcotic agent status: Secondary | ICD-10-CM | POA: Diagnosis not present

## 2013-05-23 DIAGNOSIS — Z880 Allergy status to penicillin: Secondary | ICD-10-CM | POA: Diagnosis not present

## 2013-05-23 DIAGNOSIS — Z86711 Personal history of pulmonary embolism: Secondary | ICD-10-CM | POA: Diagnosis not present

## 2013-05-23 DIAGNOSIS — S298XXA Other specified injuries of thorax, initial encounter: Secondary | ICD-10-CM | POA: Diagnosis not present

## 2013-05-23 DIAGNOSIS — G4733 Obstructive sleep apnea (adult) (pediatric): Secondary | ICD-10-CM | POA: Diagnosis not present

## 2013-05-23 DIAGNOSIS — S2239XA Fracture of one rib, unspecified side, initial encounter for closed fracture: Secondary | ICD-10-CM | POA: Diagnosis not present

## 2013-05-23 DIAGNOSIS — D649 Anemia, unspecified: Secondary | ICD-10-CM | POA: Diagnosis not present

## 2013-05-23 DIAGNOSIS — Z79899 Other long term (current) drug therapy: Secondary | ICD-10-CM | POA: Diagnosis not present

## 2013-05-23 DIAGNOSIS — Z888 Allergy status to other drugs, medicaments and biological substances status: Secondary | ICD-10-CM | POA: Diagnosis not present

## 2013-05-23 DIAGNOSIS — R209 Unspecified disturbances of skin sensation: Secondary | ICD-10-CM | POA: Diagnosis not present

## 2013-05-23 DIAGNOSIS — E079 Disorder of thyroid, unspecified: Secondary | ICD-10-CM | POA: Diagnosis not present

## 2013-05-23 DIAGNOSIS — M129 Arthropathy, unspecified: Secondary | ICD-10-CM | POA: Diagnosis not present

## 2013-05-23 DIAGNOSIS — Z87442 Personal history of urinary calculi: Secondary | ICD-10-CM | POA: Diagnosis not present

## 2013-05-25 DIAGNOSIS — M545 Low back pain, unspecified: Secondary | ICD-10-CM | POA: Diagnosis not present

## 2013-05-25 DIAGNOSIS — M1710 Unilateral primary osteoarthritis, unspecified knee: Secondary | ICD-10-CM | POA: Diagnosis not present

## 2013-05-28 DIAGNOSIS — J984 Other disorders of lung: Secondary | ICD-10-CM | POA: Diagnosis not present

## 2013-05-28 DIAGNOSIS — E538 Deficiency of other specified B group vitamins: Secondary | ICD-10-CM | POA: Diagnosis not present

## 2013-05-28 DIAGNOSIS — R05 Cough: Secondary | ICD-10-CM | POA: Diagnosis not present

## 2013-05-28 DIAGNOSIS — S2239XA Fracture of one rib, unspecified side, initial encounter for closed fracture: Secondary | ICD-10-CM | POA: Diagnosis not present

## 2013-05-28 DIAGNOSIS — J45909 Unspecified asthma, uncomplicated: Secondary | ICD-10-CM | POA: Diagnosis not present

## 2013-05-28 DIAGNOSIS — R059 Cough, unspecified: Secondary | ICD-10-CM | POA: Diagnosis not present

## 2013-06-01 DIAGNOSIS — Z0000 Encounter for general adult medical examination without abnormal findings: Secondary | ICD-10-CM | POA: Diagnosis not present

## 2013-06-01 DIAGNOSIS — Z23 Encounter for immunization: Secondary | ICD-10-CM | POA: Diagnosis not present

## 2013-06-01 DIAGNOSIS — R0602 Shortness of breath: Secondary | ICD-10-CM | POA: Diagnosis not present

## 2013-06-01 DIAGNOSIS — E7800 Pure hypercholesterolemia, unspecified: Secondary | ICD-10-CM | POA: Diagnosis not present

## 2013-06-01 DIAGNOSIS — E538 Deficiency of other specified B group vitamins: Secondary | ICD-10-CM | POA: Diagnosis not present

## 2013-06-01 DIAGNOSIS — R7301 Impaired fasting glucose: Secondary | ICD-10-CM | POA: Diagnosis not present

## 2013-06-01 DIAGNOSIS — E559 Vitamin D deficiency, unspecified: Secondary | ICD-10-CM | POA: Diagnosis not present

## 2013-06-03 DIAGNOSIS — M1710 Unilateral primary osteoarthritis, unspecified knee: Secondary | ICD-10-CM | POA: Diagnosis not present

## 2013-06-10 DIAGNOSIS — R0989 Other specified symptoms and signs involving the circulatory and respiratory systems: Secondary | ICD-10-CM | POA: Diagnosis not present

## 2013-06-10 DIAGNOSIS — R0609 Other forms of dyspnea: Secondary | ICD-10-CM | POA: Diagnosis not present

## 2013-06-10 DIAGNOSIS — IMO0001 Reserved for inherently not codable concepts without codable children: Secondary | ICD-10-CM | POA: Diagnosis not present

## 2013-06-16 DIAGNOSIS — M545 Low back pain, unspecified: Secondary | ICD-10-CM | POA: Diagnosis not present

## 2013-06-16 DIAGNOSIS — M1710 Unilateral primary osteoarthritis, unspecified knee: Secondary | ICD-10-CM | POA: Diagnosis not present

## 2013-06-17 DIAGNOSIS — M545 Low back pain, unspecified: Secondary | ICD-10-CM | POA: Diagnosis not present

## 2013-06-17 DIAGNOSIS — M1710 Unilateral primary osteoarthritis, unspecified knee: Secondary | ICD-10-CM | POA: Diagnosis not present

## 2013-06-18 DIAGNOSIS — M1710 Unilateral primary osteoarthritis, unspecified knee: Secondary | ICD-10-CM | POA: Diagnosis not present

## 2013-06-18 DIAGNOSIS — M545 Low back pain, unspecified: Secondary | ICD-10-CM | POA: Diagnosis not present

## 2013-06-22 DIAGNOSIS — L821 Other seborrheic keratosis: Secondary | ICD-10-CM | POA: Diagnosis not present

## 2013-06-22 DIAGNOSIS — L819 Disorder of pigmentation, unspecified: Secondary | ICD-10-CM | POA: Diagnosis not present

## 2013-06-22 DIAGNOSIS — D485 Neoplasm of uncertain behavior of skin: Secondary | ICD-10-CM | POA: Diagnosis not present

## 2013-06-22 DIAGNOSIS — Z85828 Personal history of other malignant neoplasm of skin: Secondary | ICD-10-CM | POA: Diagnosis not present

## 2013-06-22 DIAGNOSIS — C44721 Squamous cell carcinoma of skin of unspecified lower limb, including hip: Secondary | ICD-10-CM | POA: Diagnosis not present

## 2013-06-23 DIAGNOSIS — E538 Deficiency of other specified B group vitamins: Secondary | ICD-10-CM | POA: Diagnosis not present

## 2013-06-23 DIAGNOSIS — J45909 Unspecified asthma, uncomplicated: Secondary | ICD-10-CM | POA: Diagnosis not present

## 2013-06-23 DIAGNOSIS — R059 Cough, unspecified: Secondary | ICD-10-CM | POA: Diagnosis not present

## 2013-06-23 DIAGNOSIS — R05 Cough: Secondary | ICD-10-CM | POA: Diagnosis not present

## 2013-06-29 DIAGNOSIS — E663 Overweight: Secondary | ICD-10-CM | POA: Diagnosis not present

## 2013-06-29 DIAGNOSIS — Z0000 Encounter for general adult medical examination without abnormal findings: Secondary | ICD-10-CM | POA: Diagnosis not present

## 2013-07-03 DIAGNOSIS — J385 Laryngeal spasm: Secondary | ICD-10-CM | POA: Diagnosis not present

## 2013-07-07 DIAGNOSIS — N63 Unspecified lump in unspecified breast: Secondary | ICD-10-CM | POA: Diagnosis not present

## 2013-07-07 DIAGNOSIS — R152 Fecal urgency: Secondary | ICD-10-CM | POA: Diagnosis not present

## 2013-07-07 DIAGNOSIS — K219 Gastro-esophageal reflux disease without esophagitis: Secondary | ICD-10-CM | POA: Diagnosis not present

## 2013-07-07 DIAGNOSIS — T148XXA Other injury of unspecified body region, initial encounter: Secondary | ICD-10-CM | POA: Diagnosis not present

## 2013-07-13 DIAGNOSIS — N644 Mastodynia: Secondary | ICD-10-CM | POA: Diagnosis not present

## 2013-07-14 DIAGNOSIS — G43709 Chronic migraine without aura, not intractable, without status migrainosus: Secondary | ICD-10-CM | POA: Diagnosis not present

## 2013-08-03 DIAGNOSIS — E663 Overweight: Secondary | ICD-10-CM | POA: Diagnosis not present

## 2013-08-03 DIAGNOSIS — IMO0001 Reserved for inherently not codable concepts without codable children: Secondary | ICD-10-CM | POA: Diagnosis not present

## 2013-08-04 DIAGNOSIS — G43709 Chronic migraine without aura, not intractable, without status migrainosus: Secondary | ICD-10-CM | POA: Diagnosis not present

## 2013-08-06 DIAGNOSIS — E669 Obesity, unspecified: Secondary | ICD-10-CM | POA: Diagnosis not present

## 2013-08-06 DIAGNOSIS — IMO0001 Reserved for inherently not codable concepts without codable children: Secondary | ICD-10-CM | POA: Diagnosis not present

## 2013-08-11 DIAGNOSIS — M9981 Other biomechanical lesions of cervical region: Secondary | ICD-10-CM | POA: Diagnosis not present

## 2013-08-11 DIAGNOSIS — M5412 Radiculopathy, cervical region: Secondary | ICD-10-CM | POA: Diagnosis not present

## 2013-08-12 DIAGNOSIS — M9981 Other biomechanical lesions of cervical region: Secondary | ICD-10-CM | POA: Diagnosis not present

## 2013-08-12 DIAGNOSIS — C4492 Squamous cell carcinoma of skin, unspecified: Secondary | ICD-10-CM | POA: Diagnosis not present

## 2013-08-12 DIAGNOSIS — M5412 Radiculopathy, cervical region: Secondary | ICD-10-CM | POA: Diagnosis not present

## 2013-08-12 DIAGNOSIS — Z880 Allergy status to penicillin: Secondary | ICD-10-CM | POA: Diagnosis not present

## 2013-08-12 DIAGNOSIS — Z885 Allergy status to narcotic agent status: Secondary | ICD-10-CM | POA: Diagnosis not present

## 2013-08-20 DIAGNOSIS — Z483 Aftercare following surgery for neoplasm: Secondary | ICD-10-CM | POA: Diagnosis not present

## 2013-08-21 DIAGNOSIS — M1990 Unspecified osteoarthritis, unspecified site: Secondary | ICD-10-CM | POA: Diagnosis not present

## 2013-08-21 DIAGNOSIS — E7800 Pure hypercholesterolemia, unspecified: Secondary | ICD-10-CM | POA: Diagnosis not present

## 2013-08-21 DIAGNOSIS — IMO0001 Reserved for inherently not codable concepts without codable children: Secondary | ICD-10-CM | POA: Diagnosis not present

## 2013-08-21 DIAGNOSIS — R894 Abnormal immunological findings in specimens from other organs, systems and tissues: Secondary | ICD-10-CM | POA: Diagnosis not present

## 2013-08-21 DIAGNOSIS — M545 Low back pain, unspecified: Secondary | ICD-10-CM | POA: Diagnosis not present

## 2013-08-21 DIAGNOSIS — M2550 Pain in unspecified joint: Secondary | ICD-10-CM | POA: Diagnosis not present

## 2013-08-25 DIAGNOSIS — Z4802 Encounter for removal of sutures: Secondary | ICD-10-CM | POA: Diagnosis not present

## 2013-08-26 ENCOUNTER — Encounter: Payer: Self-pay | Admitting: Obstetrics & Gynecology

## 2013-08-28 DIAGNOSIS — M533 Sacrococcygeal disorders, not elsewhere classified: Secondary | ICD-10-CM | POA: Diagnosis not present

## 2013-08-28 DIAGNOSIS — M545 Low back pain, unspecified: Secondary | ICD-10-CM | POA: Diagnosis not present

## 2013-08-28 DIAGNOSIS — G894 Chronic pain syndrome: Secondary | ICD-10-CM | POA: Diagnosis not present

## 2013-08-28 DIAGNOSIS — M5137 Other intervertebral disc degeneration, lumbosacral region: Secondary | ICD-10-CM | POA: Diagnosis not present

## 2013-08-31 DIAGNOSIS — M5412 Radiculopathy, cervical region: Secondary | ICD-10-CM | POA: Diagnosis not present

## 2013-08-31 DIAGNOSIS — M9981 Other biomechanical lesions of cervical region: Secondary | ICD-10-CM | POA: Diagnosis not present

## 2013-09-02 DIAGNOSIS — IMO0001 Reserved for inherently not codable concepts without codable children: Secondary | ICD-10-CM | POA: Diagnosis not present

## 2013-09-08 DIAGNOSIS — M7918 Myalgia, other site: Secondary | ICD-10-CM | POA: Insufficient documentation

## 2013-09-16 DIAGNOSIS — M25559 Pain in unspecified hip: Secondary | ICD-10-CM | POA: Diagnosis not present

## 2013-09-16 DIAGNOSIS — M25569 Pain in unspecified knee: Secondary | ICD-10-CM | POA: Diagnosis not present

## 2013-09-16 DIAGNOSIS — Z96659 Presence of unspecified artificial knee joint: Secondary | ICD-10-CM | POA: Diagnosis not present

## 2013-09-21 ENCOUNTER — Other Ambulatory Visit (HOSPITAL_COMMUNITY): Payer: Self-pay | Admitting: Orthopedic Surgery

## 2013-09-21 DIAGNOSIS — M25561 Pain in right knee: Secondary | ICD-10-CM

## 2013-09-21 DIAGNOSIS — M25562 Pain in left knee: Principal | ICD-10-CM

## 2013-09-24 DIAGNOSIS — H0289 Other specified disorders of eyelid: Secondary | ICD-10-CM | POA: Diagnosis not present

## 2013-09-24 DIAGNOSIS — H02409 Unspecified ptosis of unspecified eyelid: Secondary | ICD-10-CM | POA: Diagnosis not present

## 2013-09-24 DIAGNOSIS — H0230 Blepharochalasis unspecified eye, unspecified eyelid: Secondary | ICD-10-CM | POA: Insufficient documentation

## 2013-09-29 ENCOUNTER — Ambulatory Visit (HOSPITAL_COMMUNITY)
Admission: RE | Admit: 2013-09-29 | Discharge: 2013-09-29 | Disposition: A | Discharge status: Home / Self Care | Payer: Medicare Other | Source: Ambulatory Visit | Attending: Orthopedic Surgery | Admitting: Orthopedic Surgery

## 2013-09-29 ENCOUNTER — Encounter (HOSPITAL_COMMUNITY)
Admission: RE | Admit: 2013-09-29 | Discharge: 2013-09-29 | Disposition: A | Discharge status: Home / Self Care | Payer: Medicare Other | Source: Ambulatory Visit | Attending: Orthopedic Surgery | Admitting: Orthopedic Surgery

## 2013-09-29 DIAGNOSIS — IMO0002 Reserved for concepts with insufficient information to code with codable children: Secondary | ICD-10-CM | POA: Diagnosis not present

## 2013-09-29 DIAGNOSIS — M25562 Pain in left knee: Secondary | ICD-10-CM

## 2013-09-29 DIAGNOSIS — M1710 Unilateral primary osteoarthritis, unspecified knee: Secondary | ICD-10-CM | POA: Insufficient documentation

## 2013-09-29 DIAGNOSIS — M25561 Pain in right knee: Secondary | ICD-10-CM

## 2013-09-29 DIAGNOSIS — Z96659 Presence of unspecified artificial knee joint: Secondary | ICD-10-CM | POA: Insufficient documentation

## 2013-09-29 MED ORDER — TECHNETIUM TC 99M MEDRONATE IV KIT
25.0000 | PACK | Freq: Once | INTRAVENOUS | Status: AC | PRN
  Administered 2013-09-29: 25 via INTRAVENOUS

## 2013-10-02 DIAGNOSIS — H02409 Unspecified ptosis of unspecified eyelid: Secondary | ICD-10-CM | POA: Diagnosis not present

## 2013-10-02 DIAGNOSIS — H02839 Dermatochalasis of unspecified eye, unspecified eyelid: Secondary | ICD-10-CM | POA: Diagnosis not present

## 2013-10-07 DIAGNOSIS — Z96659 Presence of unspecified artificial knee joint: Secondary | ICD-10-CM | POA: Diagnosis not present

## 2013-10-08 DIAGNOSIS — Z96659 Presence of unspecified artificial knee joint: Secondary | ICD-10-CM | POA: Diagnosis not present

## 2013-10-08 DIAGNOSIS — M25559 Pain in unspecified hip: Secondary | ICD-10-CM | POA: Diagnosis not present

## 2013-10-09 DIAGNOSIS — M533 Sacrococcygeal disorders, not elsewhere classified: Secondary | ICD-10-CM | POA: Diagnosis not present

## 2013-10-09 DIAGNOSIS — M545 Low back pain, unspecified: Secondary | ICD-10-CM | POA: Diagnosis not present

## 2013-10-09 DIAGNOSIS — G894 Chronic pain syndrome: Secondary | ICD-10-CM | POA: Diagnosis not present

## 2013-10-09 DIAGNOSIS — IMO0002 Reserved for concepts with insufficient information to code with codable children: Secondary | ICD-10-CM | POA: Diagnosis not present

## 2013-10-20 DIAGNOSIS — M4120 Other idiopathic scoliosis, site unspecified: Secondary | ICD-10-CM | POA: Diagnosis not present

## 2013-10-20 DIAGNOSIS — M545 Low back pain, unspecified: Secondary | ICD-10-CM | POA: Diagnosis not present

## 2013-10-26 DIAGNOSIS — J387 Other diseases of larynx: Secondary | ICD-10-CM | POA: Diagnosis not present

## 2013-10-26 DIAGNOSIS — E538 Deficiency of other specified B group vitamins: Secondary | ICD-10-CM | POA: Diagnosis not present

## 2013-10-26 DIAGNOSIS — J45909 Unspecified asthma, uncomplicated: Secondary | ICD-10-CM | POA: Diagnosis not present

## 2013-10-26 DIAGNOSIS — J329 Chronic sinusitis, unspecified: Secondary | ICD-10-CM | POA: Diagnosis not present

## 2013-10-29 DIAGNOSIS — R259 Unspecified abnormal involuntary movements: Secondary | ICD-10-CM | POA: Diagnosis not present

## 2013-10-30 DIAGNOSIS — M25569 Pain in unspecified knee: Secondary | ICD-10-CM | POA: Diagnosis not present

## 2013-11-03 DIAGNOSIS — G43709 Chronic migraine without aura, not intractable, without status migrainosus: Secondary | ICD-10-CM | POA: Diagnosis not present

## 2013-11-19 DIAGNOSIS — R062 Wheezing: Secondary | ICD-10-CM | POA: Diagnosis not present

## 2013-11-23 ENCOUNTER — Emergency Department (HOSPITAL_COMMUNITY)
Admission: EM | Admit: 2013-11-23 | Discharge: 2013-11-23 | Disposition: A | Discharge status: Home / Self Care | Payer: Medicare Other | Attending: Emergency Medicine | Admitting: Emergency Medicine

## 2013-11-23 ENCOUNTER — Encounter (HOSPITAL_COMMUNITY): Payer: Self-pay | Admitting: Emergency Medicine

## 2013-11-23 ENCOUNTER — Emergency Department (HOSPITAL_COMMUNITY): Payer: Medicare Other

## 2013-11-23 DIAGNOSIS — Z860100 Personal history of colon polyps, unspecified: Secondary | ICD-10-CM | POA: Insufficient documentation

## 2013-11-23 DIAGNOSIS — Z9089 Acquired absence of other organs: Secondary | ICD-10-CM | POA: Insufficient documentation

## 2013-11-23 DIAGNOSIS — G4730 Sleep apnea, unspecified: Secondary | ICD-10-CM | POA: Diagnosis not present

## 2013-11-23 DIAGNOSIS — R079 Chest pain, unspecified: Secondary | ICD-10-CM | POA: Diagnosis not present

## 2013-11-23 DIAGNOSIS — M1990 Unspecified osteoarthritis, unspecified site: Secondary | ICD-10-CM | POA: Insufficient documentation

## 2013-11-23 DIAGNOSIS — IMO0002 Reserved for concepts with insufficient information to code with codable children: Secondary | ICD-10-CM | POA: Diagnosis not present

## 2013-11-23 DIAGNOSIS — Z8719 Personal history of other diseases of the digestive system: Secondary | ICD-10-CM | POA: Insufficient documentation

## 2013-11-23 DIAGNOSIS — Z9981 Dependence on supplemental oxygen: Secondary | ICD-10-CM | POA: Diagnosis not present

## 2013-11-23 DIAGNOSIS — E0500 Thyrotoxicosis with diffuse goiter without thyrotoxic crisis or storm: Secondary | ICD-10-CM | POA: Diagnosis not present

## 2013-11-23 DIAGNOSIS — Z862 Personal history of diseases of the blood and blood-forming organs and certain disorders involving the immune mechanism: Secondary | ICD-10-CM | POA: Diagnosis not present

## 2013-11-23 DIAGNOSIS — E669 Obesity, unspecified: Secondary | ICD-10-CM | POA: Insufficient documentation

## 2013-11-23 DIAGNOSIS — Z8709 Personal history of other diseases of the respiratory system: Secondary | ICD-10-CM | POA: Diagnosis not present

## 2013-11-23 DIAGNOSIS — E0590 Thyrotoxicosis, unspecified without thyrotoxic crisis or storm: Secondary | ICD-10-CM | POA: Insufficient documentation

## 2013-11-23 DIAGNOSIS — Z86711 Personal history of pulmonary embolism: Secondary | ICD-10-CM | POA: Insufficient documentation

## 2013-11-23 DIAGNOSIS — Z79899 Other long term (current) drug therapy: Secondary | ICD-10-CM | POA: Insufficient documentation

## 2013-11-23 DIAGNOSIS — F411 Generalized anxiety disorder: Secondary | ICD-10-CM | POA: Diagnosis not present

## 2013-11-23 DIAGNOSIS — R42 Dizziness and giddiness: Secondary | ICD-10-CM | POA: Insufficient documentation

## 2013-11-23 DIAGNOSIS — Z880 Allergy status to penicillin: Secondary | ICD-10-CM | POA: Insufficient documentation

## 2013-11-23 DIAGNOSIS — Z87891 Personal history of nicotine dependence: Secondary | ICD-10-CM | POA: Diagnosis not present

## 2013-11-23 DIAGNOSIS — R51 Headache: Secondary | ICD-10-CM

## 2013-11-23 DIAGNOSIS — R519 Headache, unspecified: Secondary | ICD-10-CM

## 2013-11-23 DIAGNOSIS — G43909 Migraine, unspecified, not intractable, without status migrainosus: Secondary | ICD-10-CM | POA: Diagnosis not present

## 2013-11-23 DIAGNOSIS — R0789 Other chest pain: Secondary | ICD-10-CM

## 2013-11-23 LAB — I-STAT CHEM 8, ED
BUN: 13 mg/dL (ref 6–23)
Calcium, Ion: 1.19 mmol/L (ref 1.13–1.30)
Chloride: 104 mEq/L (ref 96–112)
Creatinine, Ser: 0.7 mg/dL (ref 0.50–1.10)
Glucose, Bld: 174 mg/dL — ABNORMAL HIGH (ref 70–99)
HCT: 42 % (ref 36.0–46.0)
Hemoglobin: 14.3 g/dL (ref 12.0–15.0)
Potassium: 4.2 mEq/L (ref 3.7–5.3)
Sodium: 142 mEq/L (ref 137–147)
TCO2: 26 mmol/L (ref 0–100)

## 2013-11-23 LAB — TROPONIN I: Troponin I: <0.3 ng/mL (ref <0.30)

## 2013-11-23 MED ORDER — SUMATRIPTAN SUCCINATE 6 MG/0.5ML ~~LOC~~ SOLN
6.0000 mg | Freq: Once | SUBCUTANEOUS | Status: AC
  Administered 2013-11-23: 6 mg via SUBCUTANEOUS
  Filled 2013-11-23: qty 0.5

## 2013-11-23 MED ORDER — KETOROLAC TROMETHAMINE 30 MG/ML IJ SOLN
30.0000 mg | Freq: Once | INTRAMUSCULAR | Status: AC
  Administered 2013-11-23: 30 mg via INTRAVENOUS
  Filled 2013-11-23: qty 1

## 2013-11-23 MED ORDER — DIPHENHYDRAMINE HCL 50 MG/ML IJ SOLN
25.0000 mg | Freq: Once | INTRAMUSCULAR | Status: AC
  Administered 2013-11-23: 25 mg via INTRAVENOUS
  Filled 2013-11-23: qty 1

## 2013-11-23 MED ORDER — METOCLOPRAMIDE HCL 5 MG/ML IJ SOLN
10.0000 mg | Freq: Once | INTRAMUSCULAR | Status: AC
Start: 2013-11-23 — End: 2013-11-23
  Administered 2013-11-23: 10 mg via INTRAVENOUS
  Filled 2013-11-23: qty 2

## 2013-11-23 NOTE — ED Notes (Signed)
Spoke with [facility] in [place], stated that pt is near the end of CT line and it will be several hours before scan is completed,

## 2013-11-23 NOTE — ED Notes (Signed)
Pt's O2 sats dropped to 89% on RA.  Pt placed on 2L [place]; currently sating 94%.

## 2013-11-23 NOTE — ED Notes (Signed)
Pt presents via EMS with c/o left sided headache radiating into neck and temple, states it "makes a halo" around her head. Pain 8/10, sudden onset at noon today. Also c/o chest pain and feeling throat constriction, both symptoms have resolved. Pt reports hx of migraines, usually on the right side of her head. Went to cardiologist for treadmill test this morning in preparation for Botox surgery on Friday, cardiologist sent pt to ED. EMS VS 132/95, HR 100. Hx of PE. Husband at bedside

## 2013-11-23 NOTE — ED Provider Notes (Signed)
CSN: [id number]     Arrival date & time 11/23/13  1557 History   First MD Initiated Contact with Patient 11/23/13 1613     Chief Complaint  Patient presents with  . Headache  . Chest Pain     (Consider location/radiation/quality/duration/timing/severity/associated sxs/prior Treatment) HPI Comments: Pt with long h/o severe migraines, used to take Imitrex successfully, now cannot due to cost, instead takes oral maxalt, toradol and receives Botox by PCP on regular basis.  Pt has no h/o MI or CAD, had a neg cath about 6 years ago.  Pt was sewing and at noon today had sudden onset of pain in chest on left side, grabbing sensation and squeezing, no SOb, sweats, nasuea with radiation of pain to left neck and into left temple and head and now has a halo like HA all the way around.  No neck pain, or stiffness.  She now feels dizzy and a little nauseated.  Pt is anxious.  She did not taken any of her migraine meds prior to arrival.  CP lasted 30 minutes and fully resolved.  No pleuritic pain.  Pt has a h/o PE 3 years ago following an appendectomy, was taken off of coumadin a few months ago.  Pt has sleep apnea and uses O2 and CPAP at night time and normally has low O2 saturations at baseline.  No slurred speech, numbness or weakness or arms or legs.    Patient is a 68 y.o. female presenting with headaches and chest pain. The history is provided by the patient and the spouse.  Headache Associated symptoms: dizziness and nausea   Associated symptoms: no abdominal pain, no back pain, no cough, no fever, no neck pain, no neck stiffness and no vomiting   Chest Pain Associated symptoms: dizziness, headache and nausea   Associated symptoms: no abdominal pain, no back pain, no cough, no fever, no palpitations, no shortness of breath and not vomiting     Past Medical History  Diagnosis Date  . Hyperthyroidism   . Toxic goiter   . Reflux   . Colon polyps   . Osteoarthritis     in back  . Migraines   .  Disorder of vocal cords     spasmotic dysphonia  . Obesity, Class III, BMI 40-49.9 (morbid obesity) 04/27/2010  . Pelvic pain   . Anemia   . Clotting disorder   . Neuromuscular disorder    Past Surgical History  Procedure Laterality Date  . Abdominal hysterectomy    . Zenkers diverticulum    . Kidney stone surgery    . Laparoscopic appendectomy  11/27/2011    Procedure: APPENDECTOMY LAPAROSCOPIC;  Surgeon: Adin Hector, MD;  Location: [facility];  Service: General;  Laterality: N/A;  . Appendectomy    . Cholecystectomy    . Knee arthroscopy  1998    right  . Hand surgery  2004    both hands  . Botox injection      for migranes   Family History  Problem Relation Age of Onset  . Heart disease Mother    History  Substance Use Topics  . Smoking status: Former Smoker    Quit date: 01/03/1982  . Smokeless tobacco: Never Used  . Alcohol Use: No   OB History   Grav Para Term Preterm Abortions TAB SAB Ect Mult Living                 Review of Systems  Constitutional: Negative for fever and chills.  Respiratory: Negative for cough and shortness of breath.   Cardiovascular: Positive for chest pain. Negative for palpitations and leg swelling.  Gastrointestinal: Positive for nausea. Negative for vomiting and abdominal pain.  Musculoskeletal: Negative for back pain, neck pain and neck stiffness.  Neurological: Positive for dizziness and headaches.  All other systems reviewed and are negative.      Allergies  Nutritional supplements; Bee venom; Etodolac; Morphine; Propoxyphene n-acetaminophen; and Penicillins  Home Medications   Current Outpatient Rx  Name  Route  Sig  Dispense  Refill  . clonazePAM (KLONOPIN) 0.5 MG tablet      0.5 mg daily as needed. For agitation or anxiety         . cyclobenzaprine (FLEXERIL) 10 MG tablet   Oral   Take 10 mg by mouth at bedtime.         . diclofenac (FLECTOR) 1.3 % PTCH   Transdermal   Place 1 patch onto the skin daily as  needed. Apply to back         . diclofenac sodium (VOLTAREN) 1 % GEL   Topical   Apply 2 g topically daily as needed. For pain         . DULoxetine (CYMBALTA) 60 MG capsule   Oral   Take 60 mg by mouth daily.         [name] EPINEPHrine (EPIPEN) 0.3 mg/0.3 mL DEVI   Intramuscular   Inject 0.3 mg into the muscle once.         . gabapentin (NEURONTIN) 800 MG tablet   Oral   Take 800 mg by mouth 2 (two) times daily.         [name] levothyroxine (SYNTHROID, LEVOTHROID) 25 MCG tablet   Oral   Take 25 mcg by mouth daily.         . mometasone-formoterol (DULERA) 100-5 MCG/ACT AERO   Inhalation   Inhale 2 puffs into the lungs 2 (two) times daily.         . OnabotulinumtoxinA (BOTOX IJ)   Injection   Inject as directed.         . ondansetron (ZOFRAN-ODT) 8 MG disintegrating tablet   Oral   Take 8 mg by mouth every 8 (eight) hours as needed for nausea.          [name] oxyCODONE-acetaminophen (PERCOCET) 10-325 MG per tablet   Oral   Take 1 tablet by mouth daily as needed.          . promethazine (PHENERGAN) 25 MG tablet   Oral   Take 25 mg by mouth every 8 (eight) hours as needed. For nausea         . traMADol (ULTRAM) 50 MG tablet   Oral   Take 50 mg by mouth every 6 (six) hours as needed for severe pain.          BP 107/60  Pulse 86  Temp(Src) 98.8 F (37.1 C) (Oral)  Resp 18  Wt 236 lb (107.049 kg)  SpO2 94% Physical Exam  Nursing note and vitals reviewed. Constitutional: She is oriented to person, place, and time. She appears well-developed and well-nourished. She appears distressed.  HENT:  Head: Normocephalic.  Eyes: EOM are normal.  Neck: Normal range of motion. Neck supple. No Brudzinski's sign and no Kernig's sign noted.  Cardiovascular: Normal rate, regular rhythm and intact distal pulses.   No murmur heard. Pulmonary/Chest: Effort normal. No respiratory distress. She has no wheezes.  Abdominal: Soft. She exhibits no distension.  There is no  tenderness.  Neurological: She is alert and oriented to person, place, and time. No cranial nerve deficit. She exhibits normal muscle tone. Coordination normal.  Skin: Skin is warm and dry. No rash noted. She is not diaphoretic.  Psychiatric: Her speech is normal and behavior is normal. Thought content normal. Her mood appears anxious.  Pt's affect is somewhat dramatic, seems anxious    ED Course  Procedures (including critical care time) Labs Review Labs Reviewed  I-STAT CHEM 8, ED - Abnormal; Notable for the following:    Glucose, Bld 174 (*)    All other components within normal limits  TROPONIN I   Imaging Review Ct Head Wo Contrast  11/23/2013   CLINICAL DATA:  Left-sided headache.  EXAM: CT HEAD WITHOUT CONTRAST  TECHNIQUE: Contiguous axial images were obtained from the base of the skull through the vertex without intravenous contrast.  COMPARISON:  12/1911  FINDINGS: Ventricles are normal in size and configuration. There are no parenchymal masses or mass effect. There is no evidence of a cortical infarct. Small hypoattenuation focus in the superior right cerebellum may reflect an old lacune infarct. It could potentially reflect a dilated perivascular space. It is stable.  No extra-axial masses or abnormal fluid collections.  There is no intracranial hemorrhage.  Visualized sinuses and mastoid air cells are clear.  IMPRESSION: 1. No acute intracranial abnormality. No change from the prior study.   Electronically Signed   By: Lajean Manes M.D.   On: 11/23/2013 18:28     EKG Interpretation   Date/Time:  Monday November 23 2013 16:04:43 EDT Ventricular Rate:  99 PR Interval:  171 QRS Duration: 74 QT Interval:  343 QTC Calculation: 440 R Axis:   42 Text Interpretation:  Sinus rhythm Low voltage, precordial leads Otherwise  normal ECG No significant change since last tracing Confirmed by [facility]  MD,  MICHEAL ([id number]) on 11/23/2013 4:36:14 PM      7:13 PM Imitrex did not improve  symptoms. Thus IV benadryl, reglan and toradol given.  Head CT shows no acute abn.  After IV meds, pt reports feeling completely resolved.  No CP, SOB, pleurisy.  HA is gone.  No slurred speech or any focal deficits.  Will d/c home.    MDM   Final diagnoses:  Headache  Chest pain, atypical    Pt with normal VS except relatively low O2 sat which pt reports is normal for her.  CP was not pleuritic, and has resolved, ECG shows no acute ischemia, will get 1 troponin to ensure this was not ACS.  Will try imitrex.  Given age and since HA is different from priors, will get a head CT.      Saddie Benders. Dorna Mai, MD 11/23/13 [phone]

## 2013-11-23 NOTE — Discharge Instructions (Signed)
Migraine Headache A migraine headache is an intense, throbbing pain on one or both sides of your head. A migraine can last for 30 minutes to several hours. CAUSES  The exact cause of a migraine headache is not always known. However, a migraine may be caused when nerves in the brain become irritated and release chemicals that cause inflammation. This causes pain. Certain things may also trigger migraines, such as:  Alcohol.  Smoking.  Stress.  Menstruation.  Aged cheeses.  Foods or drinks that contain nitrates, glutamate, aspartame, or tyramine.  Lack of sleep.  Chocolate.  Caffeine.  Hunger.  Physical exertion.  Fatigue.  Medicines used to treat chest pain (nitroglycerine), birth control pills, estrogen, and some blood pressure medicines. SIGNS AND SYMPTOMS  Pain on one or both sides of your head.  Pulsating or throbbing pain.  Severe pain that prevents daily activities.  Pain that is aggravated by any physical activity.  Nausea, vomiting, or both.  Dizziness.  Pain with exposure to bright lights, loud noises, or activity.  General sensitivity to bright lights, loud noises, or smells. Before you get a migraine, you may get warning signs that a migraine is coming (aura). An aura may include:  Seeing flashing lights.  Seeing bright spots, halos, or zig-zag lines.  Having tunnel vision or blurred vision.  Having feelings of numbness or tingling.  Having trouble talking.  Having muscle weakness. DIAGNOSIS  A migraine headache is often diagnosed based on:  Symptoms.  Physical exam.  A CT scan or MRI of your head. These imaging tests cannot diagnose migraines, but they can help rule out other causes of headaches. TREATMENT Medicines may be given for pain and nausea. Medicines can also be given to help prevent recurrent migraines.  HOME CARE INSTRUCTIONS  Only take over-the-counter or prescription medicines for pain or discomfort as directed by your  health care provider. The use of long-term narcotics is not recommended.  Lie down in a dark, quiet room when you have a migraine.  Keep a journal to find out what may trigger your migraine headaches. For example, write down:  What you eat and drink.  How much sleep you get.  Any change to your diet or medicines.  Limit alcohol consumption.  Quit smoking if you smoke.  Get 7 9 hours of sleep, or as recommended by your health care provider.  Limit stress.  Keep lights dim if bright lights bother you and make your migraines worse. SEEK IMMEDIATE MEDICAL CARE IF:   Your migraine becomes severe.  You have a fever.  You have a stiff neck.  You have vision loss.  You have muscular weakness or loss of muscle control.  You start losing your balance or have trouble walking.  You feel faint or pass out.  You have severe symptoms that are different from your first symptoms. MAKE SURE YOU:   Understand these instructions.  Will watch your condition.  Will get help right away if you are not doing well or get worse. Document Released: 09/03/2005 Document Revised: 06/24/2013 Document Reviewed: 05/11/2013 [employer] Patient Information 2014 [employer].  

## 2013-11-24 DIAGNOSIS — H0230 Blepharochalasis unspecified eye, unspecified eyelid: Secondary | ICD-10-CM | POA: Diagnosis not present

## 2013-11-24 DIAGNOSIS — H0289 Other specified disorders of eyelid: Secondary | ICD-10-CM | POA: Diagnosis not present

## 2013-11-24 DIAGNOSIS — H02409 Unspecified ptosis of unspecified eyelid: Secondary | ICD-10-CM | POA: Diagnosis not present

## 2013-11-27 DIAGNOSIS — H02409 Unspecified ptosis of unspecified eyelid: Secondary | ICD-10-CM | POA: Diagnosis not present

## 2013-11-27 DIAGNOSIS — E669 Obesity, unspecified: Secondary | ICD-10-CM | POA: Diagnosis not present

## 2013-11-27 DIAGNOSIS — E119 Type 2 diabetes mellitus without complications: Secondary | ICD-10-CM | POA: Diagnosis not present

## 2013-11-27 DIAGNOSIS — Z87891 Personal history of nicotine dependence: Secondary | ICD-10-CM | POA: Diagnosis not present

## 2013-11-27 DIAGNOSIS — H0230 Blepharochalasis unspecified eye, unspecified eyelid: Secondary | ICD-10-CM | POA: Diagnosis not present

## 2013-11-27 DIAGNOSIS — H04169 Lacrimal gland dislocation, unspecified lacrimal gland: Secondary | ICD-10-CM | POA: Diagnosis not present

## 2013-11-27 DIAGNOSIS — G4733 Obstructive sleep apnea (adult) (pediatric): Secondary | ICD-10-CM | POA: Diagnosis not present

## 2013-11-28 DIAGNOSIS — H02409 Unspecified ptosis of unspecified eyelid: Secondary | ICD-10-CM | POA: Diagnosis not present

## 2013-11-28 DIAGNOSIS — E119 Type 2 diabetes mellitus without complications: Secondary | ICD-10-CM | POA: Diagnosis not present

## 2013-11-28 DIAGNOSIS — H0230 Blepharochalasis unspecified eye, unspecified eyelid: Secondary | ICD-10-CM | POA: Diagnosis not present

## 2013-11-28 DIAGNOSIS — G4733 Obstructive sleep apnea (adult) (pediatric): Secondary | ICD-10-CM | POA: Diagnosis not present

## 2013-11-28 DIAGNOSIS — Z87891 Personal history of nicotine dependence: Secondary | ICD-10-CM | POA: Diagnosis not present

## 2013-11-28 DIAGNOSIS — E669 Obesity, unspecified: Secondary | ICD-10-CM | POA: Diagnosis not present

## 2013-11-29 DIAGNOSIS — H0230 Blepharochalasis unspecified eye, unspecified eyelid: Secondary | ICD-10-CM | POA: Diagnosis not present

## 2013-11-29 DIAGNOSIS — E119 Type 2 diabetes mellitus without complications: Secondary | ICD-10-CM | POA: Diagnosis not present

## 2013-11-29 DIAGNOSIS — E669 Obesity, unspecified: Secondary | ICD-10-CM | POA: Diagnosis not present

## 2013-11-29 DIAGNOSIS — Z87891 Personal history of nicotine dependence: Secondary | ICD-10-CM | POA: Diagnosis not present

## 2013-11-29 DIAGNOSIS — H02409 Unspecified ptosis of unspecified eyelid: Secondary | ICD-10-CM | POA: Diagnosis not present

## 2013-11-29 DIAGNOSIS — G4733 Obstructive sleep apnea (adult) (pediatric): Secondary | ICD-10-CM | POA: Diagnosis not present

## 2013-12-03 DIAGNOSIS — H0289 Other specified disorders of eyelid: Secondary | ICD-10-CM | POA: Diagnosis not present

## 2013-12-03 DIAGNOSIS — H0230 Blepharochalasis unspecified eye, unspecified eyelid: Secondary | ICD-10-CM | POA: Diagnosis not present

## 2013-12-03 DIAGNOSIS — H02409 Unspecified ptosis of unspecified eyelid: Secondary | ICD-10-CM | POA: Diagnosis not present

## 2013-12-04 DIAGNOSIS — M25569 Pain in unspecified knee: Secondary | ICD-10-CM | POA: Diagnosis not present

## 2013-12-04 DIAGNOSIS — M1710 Unilateral primary osteoarthritis, unspecified knee: Secondary | ICD-10-CM | POA: Diagnosis not present

## 2013-12-04 DIAGNOSIS — IMO0002 Reserved for concepts with insufficient information to code with codable children: Secondary | ICD-10-CM | POA: Diagnosis not present

## 2013-12-04 DIAGNOSIS — R300 Dysuria: Secondary | ICD-10-CM | POA: Diagnosis not present

## 2013-12-04 DIAGNOSIS — Z96659 Presence of unspecified artificial knee joint: Secondary | ICD-10-CM | POA: Diagnosis not present

## 2013-12-08 DIAGNOSIS — H02409 Unspecified ptosis of unspecified eyelid: Secondary | ICD-10-CM | POA: Diagnosis not present

## 2013-12-16 DIAGNOSIS — M1710 Unilateral primary osteoarthritis, unspecified knee: Secondary | ICD-10-CM | POA: Diagnosis not present

## 2013-12-16 DIAGNOSIS — IMO0002 Reserved for concepts with insufficient information to code with codable children: Secondary | ICD-10-CM | POA: Diagnosis not present

## 2013-12-16 DIAGNOSIS — M25569 Pain in unspecified knee: Secondary | ICD-10-CM | POA: Diagnosis not present

## 2013-12-21 ENCOUNTER — Encounter (HOSPITAL_COMMUNITY): Payer: Self-pay | Admitting: Emergency Medicine

## 2013-12-21 ENCOUNTER — Inpatient Hospital Stay (HOSPITAL_COMMUNITY)
Admission: EM | Admit: 2013-12-21 | Discharge: 2013-12-26 | DRG: 189 | Disposition: A | Discharge status: Home / Self Care | Payer: Medicare Other | Attending: Internal Medicine | Admitting: Internal Medicine

## 2013-12-21 ENCOUNTER — Emergency Department (HOSPITAL_COMMUNITY): Payer: Medicare Other

## 2013-12-21 DIAGNOSIS — D649 Anemia, unspecified: Secondary | ICD-10-CM | POA: Diagnosis present

## 2013-12-21 DIAGNOSIS — M1990 Unspecified osteoarthritis, unspecified site: Secondary | ICD-10-CM | POA: Diagnosis present

## 2013-12-21 DIAGNOSIS — J984 Other disorders of lung: Secondary | ICD-10-CM | POA: Diagnosis not present

## 2013-12-21 DIAGNOSIS — Z860100 Personal history of colon polyps, unspecified: Secondary | ICD-10-CM

## 2013-12-21 DIAGNOSIS — Z86711 Personal history of pulmonary embolism: Secondary | ICD-10-CM | POA: Diagnosis present

## 2013-12-21 DIAGNOSIS — G4730 Sleep apnea, unspecified: Secondary | ICD-10-CM | POA: Diagnosis not present

## 2013-12-21 DIAGNOSIS — J441 Chronic obstructive pulmonary disease with (acute) exacerbation: Secondary | ICD-10-CM | POA: Diagnosis not present

## 2013-12-21 DIAGNOSIS — R059 Cough, unspecified: Secondary | ICD-10-CM | POA: Diagnosis not present

## 2013-12-21 DIAGNOSIS — G4733 Obstructive sleep apnea (adult) (pediatric): Secondary | ICD-10-CM

## 2013-12-21 DIAGNOSIS — E119 Type 2 diabetes mellitus without complications: Secondary | ICD-10-CM | POA: Diagnosis not present

## 2013-12-21 DIAGNOSIS — E1165 Type 2 diabetes mellitus with hyperglycemia: Secondary | ICD-10-CM

## 2013-12-21 DIAGNOSIS — R490 Dysphonia: Secondary | ICD-10-CM | POA: Diagnosis present

## 2013-12-21 DIAGNOSIS — J383 Other diseases of vocal cords: Secondary | ICD-10-CM

## 2013-12-21 DIAGNOSIS — J9620 Acute and chronic respiratory failure, unspecified whether with hypoxia or hypercapnia: Principal | ICD-10-CM | POA: Diagnosis present

## 2013-12-21 DIAGNOSIS — I2782 Chronic pulmonary embolism: Secondary | ICD-10-CM | POA: Diagnosis present

## 2013-12-21 DIAGNOSIS — K219 Gastro-esophageal reflux disease without esophagitis: Secondary | ICD-10-CM | POA: Diagnosis present

## 2013-12-21 DIAGNOSIS — Z8249 Family history of ischemic heart disease and other diseases of the circulatory system: Secondary | ICD-10-CM | POA: Diagnosis not present

## 2013-12-21 DIAGNOSIS — K649 Unspecified hemorrhoids: Secondary | ICD-10-CM

## 2013-12-21 DIAGNOSIS — I498 Other specified cardiac arrhythmias: Secondary | ICD-10-CM | POA: Diagnosis present

## 2013-12-21 DIAGNOSIS — R0602 Shortness of breath: Secondary | ICD-10-CM | POA: Diagnosis not present

## 2013-12-21 DIAGNOSIS — R062 Wheezing: Secondary | ICD-10-CM | POA: Diagnosis not present

## 2013-12-21 DIAGNOSIS — R05 Cough: Secondary | ICD-10-CM | POA: Diagnosis not present

## 2013-12-21 DIAGNOSIS — R0609 Other forms of dyspnea: Secondary | ICD-10-CM | POA: Diagnosis not present

## 2013-12-21 DIAGNOSIS — R079 Chest pain, unspecified: Secondary | ICD-10-CM | POA: Diagnosis present

## 2013-12-21 DIAGNOSIS — Z87891 Personal history of nicotine dependence: Secondary | ICD-10-CM | POA: Diagnosis not present

## 2013-12-21 DIAGNOSIS — Z6838 Body mass index (BMI) 38.0-38.9, adult: Secondary | ICD-10-CM

## 2013-12-21 DIAGNOSIS — R1013 Epigastric pain: Secondary | ICD-10-CM | POA: Diagnosis present

## 2013-12-21 DIAGNOSIS — I809 Phlebitis and thrombophlebitis of unspecified site: Secondary | ICD-10-CM

## 2013-12-21 DIAGNOSIS — N200 Calculus of kidney: Secondary | ICD-10-CM

## 2013-12-21 DIAGNOSIS — K8020 Calculus of gallbladder without cholecystitis without obstruction: Secondary | ICD-10-CM

## 2013-12-21 DIAGNOSIS — IMO0001 Reserved for inherently not codable concepts without codable children: Secondary | ICD-10-CM | POA: Diagnosis present

## 2013-12-21 DIAGNOSIS — F3289 Other specified depressive episodes: Secondary | ICD-10-CM

## 2013-12-21 DIAGNOSIS — R61 Generalized hyperhidrosis: Secondary | ICD-10-CM

## 2013-12-21 DIAGNOSIS — I517 Cardiomegaly: Secondary | ICD-10-CM | POA: Diagnosis not present

## 2013-12-21 DIAGNOSIS — G589 Mononeuropathy, unspecified: Secondary | ICD-10-CM

## 2013-12-21 DIAGNOSIS — E1110 Type 2 diabetes mellitus with ketoacidosis without coma: Secondary | ICD-10-CM

## 2013-12-21 DIAGNOSIS — F329 Major depressive disorder, single episode, unspecified: Secondary | ICD-10-CM

## 2013-12-21 DIAGNOSIS — Z9089 Acquired absence of other organs: Secondary | ICD-10-CM

## 2013-12-21 DIAGNOSIS — K589 Irritable bowel syndrome without diarrhea: Secondary | ICD-10-CM

## 2013-12-21 DIAGNOSIS — Z7901 Long term (current) use of anticoagulants: Secondary | ICD-10-CM

## 2013-12-21 DIAGNOSIS — F341 Dysthymic disorder: Secondary | ICD-10-CM

## 2013-12-21 DIAGNOSIS — R0902 Hypoxemia: Secondary | ICD-10-CM

## 2013-12-21 DIAGNOSIS — R0989 Other specified symptoms and signs involving the circulatory and respiratory systems: Secondary | ICD-10-CM | POA: Diagnosis not present

## 2013-12-21 DIAGNOSIS — Z9989 Dependence on other enabling machines and devices: Secondary | ICD-10-CM

## 2013-12-21 DIAGNOSIS — K5730 Diverticulosis of large intestine without perforation or abscess without bleeding: Secondary | ICD-10-CM

## 2013-12-21 DIAGNOSIS — I2699 Other pulmonary embolism without acute cor pulmonale: Secondary | ICD-10-CM

## 2013-12-21 DIAGNOSIS — G43909 Migraine, unspecified, not intractable, without status migrainosus: Secondary | ICD-10-CM

## 2013-12-21 DIAGNOSIS — K3533 Acute appendicitis with perforation and localized peritonitis, with abscess: Secondary | ICD-10-CM

## 2013-12-21 LAB — GLUCOSE, CAPILLARY: Glucose-Capillary: 256 mg/dL — ABNORMAL HIGH (ref 70–99)

## 2013-12-21 LAB — CBC WITH DIFFERENTIAL/PLATELET
Basophils Absolute: 0 10*3/uL (ref 0.0–0.1)
Basophils Relative: 0 % (ref 0–1)
Eosinophils Absolute: 0.4 10*3/uL (ref 0.0–0.7)
Eosinophils Relative: 5 % (ref 0–5)
HCT: 40.9 % (ref 36.0–46.0)
Hemoglobin: 12.8 g/dL (ref 12.0–15.0)
Lymphocytes Relative: 22 % (ref 12–46)
Lymphs Abs: 1.7 10*3/uL (ref 0.7–4.0)
MCH: 27.9 pg (ref 26.0–34.0)
MCHC: 31.3 g/dL (ref 30.0–36.0)
MCV: 89.3 fL (ref 78.0–100.0)
Monocytes Absolute: 0.7 10*3/uL (ref 0.1–1.0)
Monocytes Relative: 9 % (ref 3–12)
Neutro Abs: 5 10*3/uL (ref 1.7–7.7)
Neutrophils Relative %: 64 % (ref 43–77)
Platelets: 282 10*3/uL (ref 150–400)
RBC: 4.58 MIL/uL (ref 3.87–5.11)
RDW: 15.2 % (ref 11.5–15.5)
WBC: 7.8 10*3/uL (ref 4.0–10.5)

## 2013-12-21 LAB — I-STAT TROPONIN, ED: Troponin i, poc: 0 ng/mL (ref 0.00–0.08)

## 2013-12-21 LAB — BASIC METABOLIC PANEL
BUN: 7 mg/dL (ref 6–23)
CO2: 28 mEq/L (ref 19–32)
Calcium: 9 mg/dL (ref 8.4–10.5)
Chloride: 102 mEq/L (ref 96–112)
Creatinine, Ser: 0.7 mg/dL (ref 0.50–1.10)
GFR calc Af Amer: >90 mL/min (ref >90)
GFR calc non Af Amer: 87 mL/min — ABNORMAL LOW (ref >90)
Glucose, Bld: 103 mg/dL — ABNORMAL HIGH (ref 70–99)
Potassium: 4.1 mEq/L (ref 3.7–5.3)
Sodium: 141 mEq/L (ref 137–147)

## 2013-12-21 LAB — D-DIMER, QUANTITATIVE (NOT AT ARMC): D-Dimer, Quant: 1.14 ug/mL-FEU — ABNORMAL HIGH (ref 0.00–0.48)

## 2013-12-21 LAB — PRO B NATRIURETIC PEPTIDE: Pro B Natriuretic peptide (BNP): 82.3 pg/mL (ref 0–125)

## 2013-12-21 LAB — TROPONIN I: Troponin I: <0.3 ng/mL (ref <0.30)

## 2013-12-21 MED ORDER — INSULIN ASPART 100 UNIT/ML ~~LOC~~ SOLN
0.0000 [IU] | Freq: Every day | SUBCUTANEOUS | Status: DC
  Administered 2013-12-21: 3 [IU] via SUBCUTANEOUS
  Administered 2013-12-22: 7 [IU] via SUBCUTANEOUS

## 2013-12-21 MED ORDER — ONDANSETRON HCL 4 MG/2ML IJ SOLN: 4.0000 mg | Freq: Four times a day (QID) | INTRAMUSCULAR | Status: DC | PRN

## 2013-12-21 MED ORDER — DULOXETINE HCL 60 MG PO CPEP
60.0000 mg | ORAL_CAPSULE | Freq: Every day | ORAL | Status: DC
  Administered 2013-12-22 – 2013-12-26 (×5): 60 mg via ORAL
  Filled 2013-12-21 (×5): qty 1

## 2013-12-21 MED ORDER — IPRATROPIUM BROMIDE 0.02 % IN SOLN
0.5000 mg | Freq: Once | RESPIRATORY_TRACT | Status: AC
  Administered 2013-12-21: 0.5 mg via RESPIRATORY_TRACT
  Filled 2013-12-21: qty 2.5

## 2013-12-21 MED ORDER — IPRATROPIUM BROMIDE 0.02 % IN SOLN
0.5000 mg | Freq: Four times a day (QID) | RESPIRATORY_TRACT | Status: DC
  Administered 2013-12-21 – 2013-12-22 (×4): 0.5 mg via RESPIRATORY_TRACT
  Filled 2013-12-21 (×3): qty 2.5

## 2013-12-21 MED ORDER — SUMATRIPTAN SUCCINATE 50 MG PO TABS
50.0000 mg | ORAL_TABLET | Freq: Once | ORAL | Status: AC
  Administered 2013-12-21: 50 mg via ORAL
  Filled 2013-12-21: qty 1

## 2013-12-21 MED ORDER — ENOXAPARIN SODIUM 40 MG/0.4ML ~~LOC~~ SOLN
40.0000 mg | Freq: Every day | SUBCUTANEOUS | Status: DC
  Administered 2013-12-21 – 2013-12-25 (×5): 40 mg via SUBCUTANEOUS
  Filled 2013-12-21 (×6): qty 0.4

## 2013-12-21 MED ORDER — INSULIN ASPART 100 UNIT/ML ~~LOC~~ SOLN
0.0000 [IU] | Freq: Three times a day (TID) | SUBCUTANEOUS | Status: DC
  Administered 2013-12-22: 15 [IU] via SUBCUTANEOUS
  Administered 2013-12-22: 11 [IU] via SUBCUTANEOUS
  Administered 2013-12-22 – 2013-12-23 (×2): 4 [IU] via SUBCUTANEOUS
  Administered 2013-12-23: 7 [IU] via SUBCUTANEOUS
  Administered 2013-12-23 – 2013-12-24 (×2): 4 [IU] via SUBCUTANEOUS
  Administered 2013-12-24: 11 [IU] via SUBCUTANEOUS
  Administered 2013-12-24: 4 [IU] via SUBCUTANEOUS
  Administered 2013-12-25: 11 [IU] via SUBCUTANEOUS
  Administered 2013-12-25: 4 [IU] via SUBCUTANEOUS
  Administered 2013-12-26: 7 [IU] via SUBCUTANEOUS
  Administered 2013-12-26: 4 [IU] via SUBCUTANEOUS

## 2013-12-21 MED ORDER — MOMETASONE FURO-FORMOTEROL FUM 100-5 MCG/ACT IN AERO
2.0000 | INHALATION_SPRAY | Freq: Two times a day (BID) | RESPIRATORY_TRACT | Status: DC
  Administered 2013-12-21 – 2013-12-22 (×2): 2 via RESPIRATORY_TRACT
  Filled 2013-12-21: qty 8.8

## 2013-12-21 MED ORDER — DOCUSATE SODIUM 100 MG PO CAPS
100.0000 mg | ORAL_CAPSULE | Freq: Two times a day (BID) | ORAL | Status: DC
  Administered 2013-12-21 – 2013-12-26 (×10): 100 mg via ORAL
  Filled 2013-12-21 (×11): qty 1

## 2013-12-21 MED ORDER — SODIUM CHLORIDE 0.9 % IJ SOLN
3.0000 mL | Freq: Two times a day (BID) | INTRAMUSCULAR | Status: DC
  Administered 2013-12-21 – 2013-12-25 (×6): 3 mL via INTRAVENOUS

## 2013-12-21 MED ORDER — GABAPENTIN 400 MG PO CAPS
800.0000 mg | ORAL_CAPSULE | Freq: Two times a day (BID) | ORAL | Status: DC
Start: 2013-12-21 — End: 2013-12-26
  Administered 2013-12-21 – 2013-12-26 (×10): 800 mg via ORAL
  Filled 2013-12-21 (×11): qty 2

## 2013-12-21 MED ORDER — GUAIFENESIN ER 600 MG PO TB12
600.0000 mg | ORAL_TABLET | Freq: Two times a day (BID) | ORAL | Status: DC
  Administered 2013-12-21 – 2013-12-22 (×2): 600 mg via ORAL
  Filled 2013-12-21 (×3): qty 1

## 2013-12-21 MED ORDER — METHYLPREDNISOLONE SODIUM SUCC 125 MG IJ SOLR
60.0000 mg | Freq: Two times a day (BID) | INTRAMUSCULAR | Status: DC
  Administered 2013-12-21 – 2013-12-22 (×2): 60 mg via INTRAVENOUS
  Filled 2013-12-21 (×3): qty 0.96

## 2013-12-21 MED ORDER — GUAIFENESIN-DM 100-10 MG/5ML PO SYRP
5.0000 mL | ORAL_SOLUTION | ORAL | Status: DC | PRN
  Administered 2013-12-22 – 2013-12-24 (×2): 5 mL via ORAL
  Filled 2013-12-21 (×2): qty 10

## 2013-12-21 MED ORDER — LEVOFLOXACIN IN D5W 750 MG/150ML IV SOLN
750.0000 mg | INTRAVENOUS | Status: DC
  Administered 2013-12-21 – 2013-12-22 (×2): 750 mg via INTRAVENOUS
  Filled 2013-12-21 (×3): qty 150

## 2013-12-21 MED ORDER — ACETAMINOPHEN 650 MG RE SUPP: 650.0000 mg | Freq: Four times a day (QID) | RECTAL | Status: DC | PRN

## 2013-12-21 MED ORDER — LEVOTHYROXINE SODIUM 25 MCG PO TABS
25.0000 ug | ORAL_TABLET | Freq: Every day | ORAL | Status: DC
  Administered 2013-12-22 – 2013-12-26 (×5): 25 ug via ORAL
  Filled 2013-12-21 (×6): qty 1

## 2013-12-21 MED ORDER — CYCLOBENZAPRINE HCL 10 MG PO TABS
10.0000 mg | ORAL_TABLET | Freq: Every day | ORAL | Status: DC
  Administered 2013-12-21 – 2013-12-25 (×5): 10 mg via ORAL
  Filled 2013-12-21 (×6): qty 1

## 2013-12-21 MED ORDER — ALBUTEROL SULFATE (2.5 MG/3ML) 0.083% IN NEBU
2.5000 mg | INHALATION_SOLUTION | RESPIRATORY_TRACT | Status: DC | PRN
Start: 2013-12-21 — End: 2013-12-22
  Administered 2013-12-22: 2.5 mg via RESPIRATORY_TRACT
  Filled 2013-12-21: qty 3

## 2013-12-21 MED ORDER — ACETAMINOPHEN 325 MG PO TABS: 650.0000 mg | ORAL_TABLET | Freq: Four times a day (QID) | ORAL | Status: DC | PRN

## 2013-12-21 MED ORDER — ALBUTEROL SULFATE (2.5 MG/3ML) 0.083% IN NEBU
5.0000 mg | INHALATION_SOLUTION | Freq: Once | RESPIRATORY_TRACT | Status: AC
Start: 2013-12-21 — End: 2013-12-21
  Administered 2013-12-21: 5 mg via RESPIRATORY_TRACT
  Filled 2013-12-21: qty 6

## 2013-12-21 MED ORDER — SODIUM CHLORIDE 0.9 % IJ SOLN
3.0000 mL | Freq: Two times a day (BID) | INTRAMUSCULAR | Status: DC
  Administered 2013-12-21 – 2013-12-26 (×4): 3 mL via INTRAVENOUS

## 2013-12-21 MED ORDER — CLONAZEPAM 0.5 MG PO TABS
0.5000 mg | ORAL_TABLET | Freq: Every day | ORAL | Status: DC
  Administered 2013-12-21 – 2013-12-25 (×5): 0.5 mg via ORAL
  Filled 2013-12-21 (×5): qty 1

## 2013-12-21 MED ORDER — SODIUM CHLORIDE 0.9 % IV SOLN
250.0000 mL | INTRAVENOUS | Status: DC | PRN
  Administered 2013-12-21: 250 mL via INTRAVENOUS

## 2013-12-21 MED ORDER — HYDROCODONE-ACETAMINOPHEN 5-325 MG PO TABS
1.0000 | ORAL_TABLET | ORAL | Status: DC | PRN
  Administered 2013-12-22 – 2013-12-25 (×8): 2 via ORAL
  Filled 2013-12-21 (×8): qty 2

## 2013-12-21 MED ORDER — ACETAMINOPHEN 325 MG PO TABS
650.0000 mg | ORAL_TABLET | Freq: Once | ORAL | Status: DC
  Filled 2013-12-21: qty 2

## 2013-12-21 MED ORDER — ONDANSETRON HCL 4 MG PO TABS: 4.0000 mg | ORAL_TABLET | Freq: Four times a day (QID) | ORAL | Status: DC | PRN

## 2013-12-21 MED ORDER — HYDROCODONE-ACETAMINOPHEN 5-325 MG PO TABS: 1.0000 | ORAL_TABLET | Freq: Four times a day (QID) | ORAL | Status: DC | PRN

## 2013-12-21 MED ORDER — PRIMIDONE 50 MG PO TABS
50.0000 mg | ORAL_TABLET | Freq: Every day | ORAL | Status: DC
  Administered 2013-12-21 – 2013-12-25 (×5): 50 mg via ORAL
  Filled 2013-12-21 (×6): qty 1

## 2013-12-21 MED ORDER — DEXAMETHASONE SODIUM PHOSPHATE 10 MG/ML IJ SOLN
10.0000 mg | Freq: Once | INTRAMUSCULAR | Status: AC
  Administered 2013-12-21: 10 mg via INTRAVENOUS
  Filled 2013-12-21: qty 1

## 2013-12-21 MED ORDER — SODIUM CHLORIDE 0.9 % IJ SOLN
3.0000 mL | INTRAMUSCULAR | Status: DC | PRN
  Administered 2013-12-25: 3 mL via INTRAVENOUS

## 2013-12-21 NOTE — ED Notes (Signed)
Pt c/o sore throat and headache, verbal order received from Dr. Audie Pinto for tylenol.

## 2013-12-21 NOTE — ED Notes (Signed)
Ambulated pt without O2. Pt was 88 O2 and then gradually went down to 83 O2, then back up to 88 O2.  Stand by assist, pt feels drained from ambulating

## 2013-12-21 NOTE — ED Provider Notes (Signed)
CSN: [id number]     Arrival date & time 12/21/13  1541 History   First MD Initiated Contact with Patient 12/21/13 1555     Chief Complaint  Patient presents with  . Shortness of Breath  . Cough      HPI Patient presents with shortness of breath and cough for 3-4 months.  Been treated by her PCP with no significant success.  Patient does have remote history of smoking several years ago.  Has had a productive cough with yellowish sputum.  Currently on antibiotics.  Currently not taking steroids.  Father had a history of COPD/emphysema.  She has never had that diagnosis. Past Medical History  Diagnosis Date  . Hyperthyroidism   . Toxic goiter   . Reflux   . Colon polyps   . Osteoarthritis     in back  . Migraines   . Disorder of vocal cords     spasmotic dysphonia  . Obesity, Class III, BMI 40-49.9 (morbid obesity) 04/27/2010  . Pelvic pain   . Anemia   . Clotting disorder   . Neuromuscular disorder    Past Surgical History  Procedure Laterality Date  . Abdominal hysterectomy    . Zenkers diverticulum    . Kidney stone surgery    . Laparoscopic appendectomy  11/27/2011    Procedure: APPENDECTOMY LAPAROSCOPIC;  Surgeon: Adin Hector, MD;  Location: [facility];  Service: General;  Laterality: N/A;  . Appendectomy    . Cholecystectomy    . Knee arthroscopy  1998    right  . Hand surgery  2004    both hands  . Botox injection      for migranes   Family History  Problem Relation Age of Onset  . Heart disease Mother    History  Substance Use Topics  . Smoking status: Former Smoker    Quit date: 01/03/1982  . Smokeless tobacco: Never Used  . Alcohol Use: No   OB History   Grav Para Term Preterm Abortions TAB SAB Ect Mult Living                 Review of Systems  All other systems reviewed and are negative.      Allergies  Nutritional supplements; Bee venom; Etodolac; Morphine; Propoxyphene n-acetaminophen; and Penicillins  Home Medications   No current  outpatient prescriptions on file. BP 109/63  Pulse 71  Temp(Src) 97.8 F (36.6 C) (Oral)  Resp 18  Ht 5\' 6"  (1.676 m)  Wt 239 lb 13.8 oz (108.8 kg)  BMI 38.73 kg/m2  SpO2 92% Physical Exam  Nursing note and vitals reviewed. Constitutional: She is oriented to person, place, and time. She appears well-developed and well-nourished. No distress.  HENT:  Head: Normocephalic and atraumatic.  Eyes: Pupils are equal, round, and reactive to light.  Neck: Normal range of motion.  Cardiovascular: Normal rate and intact distal pulses.   Pulmonary/Chest: No respiratory distress. She has wheezes.  Abdominal: Soft. Normal appearance and bowel sounds are normal. She exhibits no distension. There is no tenderness. There is no rebound.  Musculoskeletal: Normal range of motion.  Neurological: She is alert and oriented to person, place, and time. No cranial nerve deficit.  Skin: Skin is warm and dry. No rash noted.  Psychiatric: She has a normal mood and affect. Her behavior is normal.    ED Course  Procedures (including critical care time)  Patient admitted   Labs Review  Imaging Review  DG Chest 2 View (Final  result)  Result time: 12/21/13 16:37:49    Final result by Rad Results In Interface (12/21/13 16:37:49)    Narrative:   CLINICAL DATA: Cough. Wheezing. Short of breath. Weakness.  EXAM: CHEST 2 VIEW  COMPARISON: DG CHEST 2V dated 11/19/2013  FINDINGS: Cardiopericardial silhouette within normal limits. Mediastinal contours normal. Trachea midline. No airspace disease or effusion. Suboptimal penetration due to obese body habitus. Basilar atelectasis. Monitoring leads project over the chest.  IMPRESSION: No active cardiopulmonary disease.        MDM   Final diagnoses:  COPD exacerbation  Hypoxemia  Type II or unspecified type diabetes mellitus without mention of complication, not stated as uncontrolled  Sleep apnea  Esophageal reflux  History of pulmonary embolus  (PE)  Anemia, unspecified        Dot Lanes, MD 12/26/13 [phone]

## 2013-12-21 NOTE — ED Notes (Signed)
Bed: WC58 Expected date:  Expected time:  Means of arrival:  Comments: EMS- 68 yo cold symptoms, SOB

## 2013-12-21 NOTE — ED Notes (Signed)
Pt refusing tylenol at this time, requesting regular migraine medications.  Dr. Audie Pinto aware.  Awaiting orders.

## 2013-12-21 NOTE — ED Notes (Signed)
Pt transported to floor at this time.  NAD upon leaving dept.

## 2013-12-21 NOTE — Progress Notes (Signed)
Utilization Review completed.  Tylisa Alcivar RN CM  

## 2013-12-21 NOTE — ED Notes (Signed)
PER EMS - pt from PCP office with c/o SOB/cough for months, PCP wanted pt evaluated for ?not caring for herself well enough at home.  DuoNeb given by EMS.  A+Ox4, ambulatory.

## 2013-12-21 NOTE — ED Notes (Signed)
Pt taking o2 off intermittently, pt aware that she needs to wear the o2 to maintain sats, pt encouraged to continue wearing the o2.

## 2013-12-21 NOTE — ED Notes (Signed)
RT called to initiate breathing treatments.

## 2013-12-21 NOTE — H&P (Signed)
PCP:     Tommy Medal, MD  Cardiology was supposed to see Dr. Nadyne Coombes for stress test on April 13 at 4:30 pm  Chief Complaint:  dyspnea  HPI: Hayley [name] is a 67 y.o. female   has a past medical history of Hyperthyroidism; Toxic goiter; Reflux; Colon polyps; Osteoarthritis; Migraines; Disorder of vocal cords; Obesity, Class III, BMI 40-49.9 (morbid obesity) (04/27/2010); Pelvic pain; Anemia; Clotting disorder; and Neuromuscular disorder.   Presented with  Patient states ever since christmas she have had some shortness of breath and cough. This Thursday 4 days Ago she had a sudden onset of shortness of breath and cough productive of green/yellow mucus. She tried to control this with Nebulizers. 3 days ago she was started on Avelox by her ENT office but she continues to keep coughing and feels very worn out. She have had some chest pain worse with coughing. States she have had some sore throat as well. She had some low grade fevers. This morning patietn states she felt like her throat was closing up and she was lightheaded she gave her self a nebulizer treatment but the effects did not last too long. She presented to ER and was found to be hypoxic down to 80%. She requiring 6L to keep O2 >92% She uses CPAP with bleed O2 at home every night. She was never diagnosed with COPD was her PCP has started her on [facility] for "wheezing". Hospitalist was called for admission for COPD exacerbation.  Patient states she had a PE in 2013 and has been on anticoagulation for almost a 1 year. Denies any pleuritic chest pain.   Review of Systems:     Pertinent positives include: Fevers, chills, chest pain, shortness of breath at rest.  dyspnea on exertion, productive cough, wheezing.  Constitutional:  No weight loss, night sweats, fatigue, weight loss  HEENT:  No headaches, Difficulty swallowing,Tooth/dental problems,Sore throat,  No sneezing, itching, ear ache, nasal congestion, post nasal drip,  Cardio-vascular:   No , Orthopnea, PND, anasarca, dizziness, palpitations.no Bilateral lower extremity swelling  GI:  No heartburn, indigestion, abdominal pain, nausea, vomiting, diarrhea, change in bowel habits, loss of appetite, melena, blood in stool, hematemesis Resp:  no , No excess mucus, no , No non-productive cough, No coughing up of blood.No change in color of mucus.No  Skin:  no rash or lesions. No jaundice GU:  no dysuria, change in color of urine, no urgency or frequency. No straining to urinate.  No flank pain.  Musculoskeletal:  No joint pain or no joint swelling. No decreased range of motion. No back pain.  Psych:  No change in mood or affect. No depression or anxiety. No memory loss.  Neuro: no localizing neurological complaints, no tingling, no weakness, no double vision, no gait abnormality, no slurred speech, no confusion  Otherwise ROS are negative except for above, 10 systems were reviewed  Past Medical History: Past Medical History  Diagnosis Date  . Hyperthyroidism   . Toxic goiter   . Reflux   . Colon polyps   . Osteoarthritis     in back  . Migraines   . Disorder of vocal cords     spasmotic dysphonia  . Obesity, Class III, BMI 40-49.9 (morbid obesity) 04/27/2010  . Pelvic pain   . Anemia   . Clotting disorder   . Neuromuscular disorder    Past Surgical History  Procedure Laterality Date  . Abdominal hysterectomy    . Zenkers diverticulum    . Kidney stone  surgery    . Laparoscopic appendectomy  11/27/2011    Procedure: APPENDECTOMY LAPAROSCOPIC;  Surgeon: Adin Hector, MD;  Location: [facility];  Service: General;  Laterality: N/A;  . Appendectomy    . Cholecystectomy    . Knee arthroscopy  1998    right  . Hand surgery  2004    both hands  . Botox injection      for migranes     Medications: Prior to Admission medications   Medication Sig Start Date End Date Taking? Authorizing Provider  clonazePAM (KLONOPIN) 0.5 MG tablet 0.5 mg at bedtime. For  agitation or anxiety   Yes Historical Provider, MD  cyclobenzaprine (FLEXERIL) 10 MG tablet Take 10 mg by mouth at bedtime.   Yes Historical Provider, MD  diclofenac (FLECTOR) 1.3 % PTCH Place 1 patch onto the skin daily as needed. Apply to back   Yes Historical Provider, MD  DULoxetine (CYMBALTA) 60 MG capsule Take 60 mg by mouth daily.   Yes Historical Provider, MD  gabapentin (NEURONTIN) 800 MG tablet Take 800 mg by mouth 2 (two) times daily.   Yes Historical Provider, MD  HYDROcodone-acetaminophen (NORCO/VICODIN) 5-325 MG per tablet Take 1 tablet by mouth every 6 (six) hours as needed for moderate pain (pain).   Yes Historical Provider, MD  levothyroxine (SYNTHROID, LEVOTHROID) 25 MCG tablet Take 25 mcg by mouth daily.   Yes Historical Provider, MD  mometasone-formoterol (DULERA) 100-5 MCG/ACT AERO Inhale 2 puffs into the lungs 2 (two) times daily.   Yes Historical Provider, MD  ondansetron (ZOFRAN-ODT) 8 MG disintegrating tablet Take 8 mg by mouth every 8 (eight) hours as needed for nausea.  12/26/11  Yes Historical Provider, MD  primidone (MYSOLINE) 50 MG tablet Take 50 mg by mouth at bedtime.   Yes Historical Provider, MD  rizatriptan (MAXALT) 10 MG tablet Take 10 mg by mouth as needed for migraine (migraine). May repeat in 2 hours if needed   Yes Historical Provider, MD  sitaGLIPtin (JANUVIA) 100 MG tablet Take 100 mg by mouth daily.   Yes Historical Provider, MD  EPINEPHrine (EPIPEN) 0.3 mg/0.3 mL DEVI Inject 0.3 mg into the muscle once.    Historical Provider, MD  OnabotulinumtoxinA (BOTOX IJ) Inject as directed.    Historical Provider, MD    Allergies:   Allergies  Allergen Reactions  . Nutritional Supplements Anaphylaxis    Pt does not remember this happening  . Bee Venom     unknown  . Etodolac Nausea And Vomiting  . Morphine Other (See Comments)    Hallucinations  . Propoxyphene N-Acetaminophen Nausea And Vomiting  . Penicillins Rash    Social History:  Ambulatory    independently   Lives at  Home with husband   reports that she quit smoking about 31 years ago. She has never used smokeless tobacco. She reports that she does not drink alcohol or use illicit drugs.   Family History: family history includes Heart disease in her mother.    Physical Exam: Patient Vitals for the past 24 hrs:  BP Temp Temp src Pulse Resp SpO2  12/21/13 1920 112/78 mmHg - - 104 28 87 %  12/21/13 1802 118/67 mmHg - - 95 23 94 %  12/21/13 1600 - - - - - 85 %  12/21/13 1546 118/78 mmHg 98.4 F (36.9 C) Oral 96 18 94 %  12/21/13 1542 - - - - - 94 %    1. General:  in No Acute distress 2. Psychological: Alert  and Oriented 3. Head/ENT:   Moist  Mucous Membranes                          Head Non traumatic, neck supple                          Normal   Dentition 4. SKIN: normal  Skin turgor,  Skin clean Dry and intact no rash 5. Heart: Regular rate and rhythm no Murmur, Rub or gallop 6. Lungs: some  wheezes no crackles  Diminished air movement 7. Abdomen: Soft, non-tender, Non distended, obese 8. Lower extremities: no clubbing, cyanosis, or edema 9. Neurologically Grossly intact, moving all 4 extremities equally 10. MSK: Normal range of motion  body mass index is unknown because there is no weight on file.   Labs on Admission:   Recent Labs  12/21/13 1700  NA 141  K 4.1  CL 102  CO2 28  GLUCOSE 103*  BUN 7  CREATININE 0.70  CALCIUM 9.0   No results found for this basename: AST, ALT, ALKPHOS, BILITOT, PROT, ALBUMIN,  in the last 72 hours No results found for this basename: LIPASE, AMYLASE,  in the last 72 hours  Recent Labs  12/21/13 1700  WBC 7.8  NEUTROABS 5.0  HGB 12.8  HCT 40.9  MCV 89.3  PLT 282   No results found for this basename: CKTOTAL, CKMB, CKMBINDEX, TROPONINI,  in the last 72 hours No results found for this basename: TSH, T4TOTAL, FREET3, T3FREE, THYROIDAB,  in the last 72 hours No results found for this basename: VITAMINB12, FOLATE,  FERRITIN, TIBC, IRON, RETICCTPCT,  in the last 72 hours Lab Results  Component Value Date   HGBA1C 6.7* 11/28/2011    The CrCl is unknown because both a height and weight (above a minimum accepted value) are required for this calculation. ABG    Component Value Date/Time   TCO2 26 11/23/2013 1735     Lab Results  Component Value Date   DDIMER 9.93* 12/05/2011     Other results:  Cultures: No results found for this basename: sdes, specrequest, cult, reptstatus       Radiological Exams on Admission: Dg Chest 2 View  12/21/2013   CLINICAL DATA:  Cough.  Wheezing.  Short of breath.  Weakness.  EXAM: CHEST  2 VIEW  COMPARISON:  DG CHEST 2V dated 11/19/2013  FINDINGS: Cardiopericardial silhouette within normal limits. Mediastinal contours normal. Trachea midline. No airspace disease or effusion. Suboptimal penetration due to obese body habitus. Basilar atelectasis. Monitoring leads project over the chest.  IMPRESSION: No active cardiopulmonary disease.   Electronically Signed   By: Dereck Ligas M.D.   On: 12/21/2013 16:37    Chart has been reviewed  Assessment/Plan   68 yo yo F with hypoxia and dyspnea with likely COPD exacerbation.   Present on Admission:  . COPD exacerbation - continue dulera, nebulizer treatment, solumedrol,  Given high oxygen requirement observe in stepdown, levaquin.   [name] DIABETES MELLITUS, TYPE II - SSI hold januvia . Hypoxemia - most likely due to COPD exacerbation but given hx of PE will check d.dimer and may need further workup if positive . History of pulmonary embolus (PE) lovenox prophylaxis   Prophylaxis:   Lovenox, Protonix  CODE STATUS:FULL CODE  Other plan as per orders.  I have spent a total of 55 min on this admission  Susanna Benge 12/21/2013, 8:36 PM

## 2013-12-21 NOTE — ED Notes (Signed)
Initial Contact - pt resting on stretcher, reports SOB and cough "for months", pt reports only improved at one time with prednisone.  Pt reports SOB at this time, inc work of breathing noted, mildly labored, LS exp wheezing, sats in the 80s on RA.  Pt maintained on o2 at this time.  Pt denies CP.  Speaking full/clear sentences.  Skin PWD.  Dr. Audie Pinto at bs for eval.

## 2013-12-22 ENCOUNTER — Inpatient Hospital Stay (HOSPITAL_COMMUNITY): Payer: Medicare Other

## 2013-12-22 ENCOUNTER — Encounter (HOSPITAL_COMMUNITY): Payer: Self-pay | Admitting: *Deleted

## 2013-12-22 LAB — COMPREHENSIVE METABOLIC PANEL
ALT: 14 U/L (ref 0–35)
AST: 16 U/L (ref 0–37)
Albumin: 3.4 g/dL — ABNORMAL LOW (ref 3.5–5.2)
Alkaline Phosphatase: 80 U/L (ref 39–117)
BUN: 9 mg/dL (ref 6–23)
CO2: 26 mEq/L (ref 19–32)
Calcium: 9.2 mg/dL (ref 8.4–10.5)
Chloride: 101 mEq/L (ref 96–112)
Creatinine, Ser: 0.6 mg/dL (ref 0.50–1.10)
GFR calc Af Amer: >90 mL/min (ref >90)
GFR calc non Af Amer: >90 mL/min (ref >90)
Glucose, Bld: 284 mg/dL — ABNORMAL HIGH (ref 70–99)
Potassium: 4.2 mEq/L (ref 3.7–5.3)
Sodium: 140 mEq/L (ref 137–147)
Total Bilirubin: <0.2 mg/dL — ABNORMAL LOW (ref 0.3–1.2)
Total Protein: 7 g/dL (ref 6.0–8.3)

## 2013-12-22 LAB — TROPONIN I
Troponin I: <0.3 ng/mL
Troponin I: <0.3 ng/mL

## 2013-12-22 LAB — HEMOGLOBIN A1C
Hgb A1c MFr Bld: 7.2 % — ABNORMAL HIGH (ref <5.7)
Mean Plasma Glucose: 160 mg/dL — ABNORMAL HIGH (ref <117)

## 2013-12-22 LAB — TSH: TSH: 0.405 u[IU]/mL (ref 0.350–4.500)

## 2013-12-22 LAB — CBC
HCT: 40.7 % (ref 36.0–46.0)
Hemoglobin: 12.9 g/dL (ref 12.0–15.0)
MCH: 28 pg (ref 26.0–34.0)
MCHC: 31.7 g/dL (ref 30.0–36.0)
MCV: 88.3 fL (ref 78.0–100.0)
Platelets: 282 10*3/uL (ref 150–400)
RBC: 4.61 MIL/uL (ref 3.87–5.11)
RDW: 15.1 % (ref 11.5–15.5)
WBC: 5.9 10*3/uL (ref 4.0–10.5)

## 2013-12-22 LAB — PHOSPHORUS: Phosphorus: 2.5 mg/dL (ref 2.3–4.6)

## 2013-12-22 LAB — MAGNESIUM: Magnesium: 2 mg/dL (ref 1.5–2.5)

## 2013-12-22 LAB — GLUCOSE, CAPILLARY
Glucose-Capillary: 199 mg/dL — ABNORMAL HIGH (ref 70–99)
Glucose-Capillary: 304 mg/dL — ABNORMAL HIGH (ref 70–99)
Glucose-Capillary: 252 mg/dL — ABNORMAL HIGH (ref 70–99)
Glucose-Capillary: 267 mg/dL — ABNORMAL HIGH (ref 70–99)

## 2013-12-22 LAB — MRSA PCR SCREENING: MRSA by PCR: NEGATIVE

## 2013-12-22 MED ORDER — LEVALBUTEROL HCL 0.63 MG/3ML IN NEBU
0.6300 mg | INHALATION_SOLUTION | Freq: Four times a day (QID) | RESPIRATORY_TRACT | Status: DC
  Administered 2013-12-22 – 2013-12-24 (×8): 0.63 mg via RESPIRATORY_TRACT
  Filled 2013-12-22 (×15): qty 3

## 2013-12-22 MED ORDER — INSULIN ASPART 100 UNIT/ML ~~LOC~~ SOLN
4.0000 [IU] | Freq: Three times a day (TID) | SUBCUTANEOUS | Status: DC
  Administered 2013-12-22 – 2013-12-26 (×12): 4 [IU] via SUBCUTANEOUS

## 2013-12-22 MED ORDER — ARFORMOTEROL TARTRATE 15 MCG/2ML IN NEBU
15.0000 ug | INHALATION_SOLUTION | Freq: Two times a day (BID) | RESPIRATORY_TRACT | Status: DC
  Administered 2013-12-22 – 2013-12-23 (×2): 15 ug via RESPIRATORY_TRACT
  Filled 2013-12-22 (×4): qty 2

## 2013-12-22 MED ORDER — ZOLPIDEM TARTRATE 5 MG PO TABS
5.0000 mg | ORAL_TABLET | Freq: Once | ORAL | Status: AC
  Administered 2013-12-22: 5 mg via ORAL
  Filled 2013-12-22: qty 1

## 2013-12-22 MED ORDER — IOHEXOL 350 MG/ML SOLN
100.0000 mL | Freq: Once | INTRAVENOUS | Status: AC | PRN
  Administered 2013-12-22: 100 mL via INTRAVENOUS

## 2013-12-22 MED ORDER — LEVALBUTEROL HCL 0.63 MG/3ML IN NEBU: 0.6300 mg | INHALATION_SOLUTION | Freq: Four times a day (QID) | RESPIRATORY_TRACT | Status: DC | PRN

## 2013-12-22 MED ORDER — LEVALBUTEROL HCL 0.63 MG/3ML IN NEBU: 0.6300 mg | INHALATION_SOLUTION | RESPIRATORY_TRACT | Status: DC | PRN

## 2013-12-22 MED ORDER — SUMATRIPTAN SUCCINATE 6 MG/0.5ML ~~LOC~~ SOLN
6.0000 mg | Freq: Once | SUBCUTANEOUS | Status: AC
  Administered 2013-12-22: 6 mg via SUBCUTANEOUS
  Filled 2013-12-22: qty 0.5

## 2013-12-22 MED ORDER — CLONAZEPAM 0.5 MG PO TABS: 0.5000 mg | ORAL_TABLET | Freq: Once | ORAL | Status: DC

## 2013-12-22 MED ORDER — GUAIFENESIN-CODEINE 100-10 MG/5ML PO SOLN
5.0000 mL | ORAL | Status: DC | PRN
Start: 2013-12-22 — End: 2013-12-26
  Administered 2013-12-22 – 2013-12-26 (×8): 5 mL via ORAL
  Filled 2013-12-22 (×8): qty 5

## 2013-12-22 MED ORDER — BUDESONIDE 0.25 MG/2ML IN SUSP
0.2500 mg | Freq: Two times a day (BID) | RESPIRATORY_TRACT | Status: DC
  Administered 2013-12-22 – 2013-12-23 (×2): 0.25 mg via RESPIRATORY_TRACT
  Filled 2013-12-22 (×5): qty 2

## 2013-12-22 MED ORDER — IPRATROPIUM BROMIDE 0.02 % IN SOLN
0.5000 mg | Freq: Four times a day (QID) | RESPIRATORY_TRACT | Status: DC
  Administered 2013-12-22 – 2013-12-24 (×8): 0.5 mg via RESPIRATORY_TRACT
  Filled 2013-12-22 (×8): qty 2.5

## 2013-12-22 MED ORDER — INSULIN GLARGINE 100 UNIT/ML ~~LOC~~ SOLN
5.0000 [IU] | Freq: Every day | SUBCUTANEOUS | Status: DC
Start: 2013-12-22 — End: 2013-12-26
  Administered 2013-12-22 – 2013-12-25 (×4): 5 [IU] via SUBCUTANEOUS
  Filled 2013-12-22 (×5): qty 0.05

## 2013-12-22 MED ORDER — DIPHENHYDRAMINE HCL 25 MG PO CAPS
25.0000 mg | ORAL_CAPSULE | Freq: Four times a day (QID) | ORAL | Status: DC | PRN
  Administered 2013-12-22 – 2013-12-24 (×4): 25 mg via ORAL
  Filled 2013-12-22 (×3): qty 1

## 2013-12-22 MED ORDER — METHYLPREDNISOLONE SODIUM SUCC 125 MG IJ SOLR
60.0000 mg | Freq: Four times a day (QID) | INTRAMUSCULAR | Status: DC
  Administered 2013-12-22 – 2013-12-23 (×4): 60 mg via INTRAVENOUS
  Filled 2013-12-22 (×7): qty 0.96

## 2013-12-22 MED ORDER — DIPHENHYDRAMINE HCL 25 MG PO CAPS
ORAL_CAPSULE | ORAL | Status: AC
  Filled 2013-12-22: qty 1

## 2013-12-22 NOTE — Progress Notes (Signed)
Inpatient Diabetes Program Recommendations  AACE/ADA: New Consensus Statement on Inpatient Glycemic Control (2013)  Target Ranges:  Prepandial:   less than 140 mg/dL      Peak postprandial:   less than 180 mg/dL (1-2 hours)      Critically ill patients:  140 - 180 mg/dL   Reason for Visit: Hyperglycemia  Diabetes history: DM2 Outpatient Diabetes medications: Januvia 100 mg QD Current orders for Inpatient glycemic control: Novolog resistant tidwc and hs  Results for Hayley [name], Hayley [name] (MRN [id number]) as of 12/22/2013 15:03  Ref. Range 12/21/2013 23:20 12/22/2013 07:58 12/22/2013 12:20  Glucose-Capillary Latest Range: 70-99 mg/dL 256 (H) 199 (H) 304 (H)    Inpatient Diabetes Program Recommendations Insulin - Basal: Add Lantus 10 units QHS Insulin - Meal Coverage: Please consider addition of Novolog 4 units tidwc for meal coverage insulin while on steroids HgbA1C: 7.2%  Will continue to follow. Thank you. Lorenda Peck, RD, LDN, CDE Inpatient Diabetes Coordinator [phone]

## 2013-12-22 NOTE — Progress Notes (Addendum)
[facility] PROGRESS NOTE  Hayley [name] WUX:[id number] DOB: April 14, 1946 DOA: 12/21/2013 PCP: Tommy Medal, MD   Brief HPI:  Hayley [name] is a 68 y.o. female with  past medical history of Hyperthyroidism; Toxic goiter; Reflux; Colon polyps; Osteoarthritis; Migraines; Disorder of vocal cords; Obesity, Class III, BMI 40-49.9 (morbid obesity) (04/27/2010); Pelvic pain; Anemia; Clotting disorder; and Neuromuscular disorder. Presented with  some shortness of breath and cough. She tried to control this with Nebulizers. 3 days ago she was started on Avelox by her ENT office but she continues to keep coughing and feels very worn out. She have had some chest pain worse with coughing. She presented to ER and was found to be hypoxic down to 80%. She requiring 6L to keep O2 >92% . She uses CPAP with bleed O2 at home every night. She was never diagnosed with COPD was her PCP has started her on [facility] for "wheezing". Hospitalist was called for admission for COPD exacerbation. Patient has a h/o vocal cord dysfunction and she is scheduled to undergo surgery at [facility] on April 28 th 2015 for the vocal cord dysfunction. She is supposed to fly to [place] on April 18th 2015 for the surgery. She also has a h/o PE in 2013 and on anticoagulation for one year. Her D dimer came back mildly elevated, and it will be followed up with CT angio of the chest.      Assessment/Plan:  Acute Respiratory failure probably secondary to COPD exacerbation: Admitted to step down in view of her increased oxygen requirement. Suspect her vocal cord dysfunction are also contributing to her respiratory issues. She never had pulmonary function tests. Tobacco use 35 years ago.   She was started on IV steroids, bronchodilators and nasal oxygen at 4 lit [place] .  Added Brovana and Pulmicort . Changed albuterol to Xopenex and atrovent nebs.  At home she was on 2 lit of [place] oxygen and it was increased to 4 lit [place] of oxygen with CPAP  last Friday . She  was scheduled to see a pulmonologist later this week .  If her respiratory status doesn't improve in the next 24 hours, pleas call PCCm for further recommendations.     Diabetes Mellitus; - uncontrolled cbg's with solumedrol.  - resistant scale SSI - small dose of lantus and TIDAC novolog.  - hgba1c is 7.2  Elevated D dimer: - evaluate for PE, less likely in view of her clinical picture, but will order a CT angiogram of her chest as she has a h/o PE in the past.   Morbid Obesity  Tachycardia: Related to her coughing spells and her increased work of breathing.  Sinus tachycardia on the monitor.   Spasmodic dysphonia: - scheduled for surgery later this month at [facility].    DVT prophylaxis    Code Status: full code Family Communication: none at bedside Disposition Plan: pending . Remain step down.    Consultants:  none  Procedures:  none  Antibiotics:  levaquin 4/6  HPI/Subjective: Coughing, reports using cough medication with codeine.   Objective: Filed Vitals:   12/22/13 1324  BP:   Pulse: 122  Temp:   Resp: 23    Intake/Output Summary (Last 24 hours) at 12/22/13 1504 Last data filed at 12/22/13 1300  Gross per 24 hour  Intake 2065.83 ml  Output   1350 ml  Net 715.83 ml   Filed Weights   12/21/13 2251  Weight: 105.7 kg (233 lb 0.4 oz)    Exam:  General:  Alert afebrile restless  Cardiovascular: s1s2 tachycardic  Respiratory: bilateral expiratory wheezing, scattered rhonchi  Abdomen: soft NT ND BS+  Musculoskeletal: no pedal edema.   Data Reviewed: Basic Metabolic Panel:  Recent Labs Lab 12/21/13 1700 12/22/13 0342  NA 141 140  K 4.1 4.2  CL 102 101  CO2 28 26  GLUCOSE 103* 284*  BUN 7 9  CREATININE 0.70 0.60  CALCIUM 9.0 9.2  MG  --  2.0  PHOS  --  2.5   Liver Function Tests:  Recent Labs Lab 12/22/13 0342  AST 16  ALT 14  ALKPHOS 80  BILITOT <0.2*  PROT 7.0  ALBUMIN 3.4*   No results found for this basename:  LIPASE, AMYLASE,  in the last 168 hours No results found for this basename: AMMONIA,  in the last 168 hours CBC:  Recent Labs Lab 12/21/13 1700 12/22/13 0342  WBC 7.8 5.9  NEUTROABS 5.0  --   HGB 12.8 12.9  HCT 40.9 40.7  MCV 89.3 88.3  PLT 282 282   Cardiac Enzymes:  Recent Labs Lab 12/21/13 2158 12/22/13 0342 12/22/13 1017  TROPONINI <0.30 <0.30 <0.30   BNP (last 3 results)  Recent Labs  12/21/13 1700  PROBNP 82.3   CBG:  Recent Labs Lab 12/21/13 2320 12/22/13 0758 12/22/13 1220  GLUCAP 256* 199* 304*    Recent Results (from the past 240 hour(s))  MRSA PCR SCREENING     Status: None   Collection Time    12/21/13 11:06 PM      Result Value Ref Range Status   MRSA by PCR NEGATIVE  NEGATIVE Final   Comment:            The GeneXpert MRSA Assay (FDA     approved for NASAL specimens     only), is one component of a     comprehensive MRSA colonization     surveillance program. It is not     intended to diagnose MRSA     infection nor to guide or     monitor treatment for     MRSA infections.     Studies: Dg Chest 2 View  12/21/2013   CLINICAL DATA:  Cough.  Wheezing.  Short of breath.  Weakness.  EXAM: CHEST  2 VIEW  COMPARISON:  DG CHEST 2V dated 11/19/2013  FINDINGS: Cardiopericardial silhouette within normal limits. Mediastinal contours normal. Trachea midline. No airspace disease or effusion. Suboptimal penetration due to obese body habitus. Basilar atelectasis. Monitoring leads project over the chest.  IMPRESSION: No active cardiopulmonary disease.   Electronically Signed   By: Dereck Ligas M.D.   On: 12/21/2013 16:37    Scheduled Meds: . acetaminophen  650 mg Oral Once  . clonazePAM  0.5 mg Oral QHS  . cyclobenzaprine  10 mg Oral QHS  . docusate sodium  100 mg Oral BID  . DULoxetine  60 mg Oral Daily  . enoxaparin (LOVENOX) injection  40 mg Subcutaneous QHS  . gabapentin  800 mg Oral BID  . insulin aspart  0-20 Units Subcutaneous TID WC  .  insulin aspart  0-5 Units Subcutaneous QHS  . ipratropium  0.5 mg Nebulization QID  . levofloxacin (LEVAQUIN) IV  750 mg Intravenous Q24H  . levothyroxine  25 mcg Oral QAC breakfast  . methylPREDNISolone (SOLU-MEDROL) injection  60 mg Intravenous Q12H  . mometasone-formoterol  2 puff Inhalation BID  . primidone  50 mg Oral QHS  . sodium chloride  3 mL  Intravenous Q12H  . sodium chloride  3 mL Intravenous Q12H   Continuous Infusions:   Active Problems:   DIABETES MELLITUS, TYPE II   Hypoxemia   COPD exacerbation   History of pulmonary embolus (PE)    Time spent: 35 minutes    Latandra Loureiro  [facility] Pager [phone] If 7PM-7AM, please contact night-coverage at [email or web address], password [facility] 12/22/2013, 3:04 PM  LOS: 1 day

## 2013-12-22 NOTE — Progress Notes (Signed)
INITIAL NUTRITION ASSESSMENT  DOCUMENTATION CODES Per approved criteria  -Obesity Unspecified   INTERVENTION: - Encouraged continued excellent intake - Recommend continued follow up with Forestine Na RD for healthy eating plans - Will continue to monitor   NUTRITION DIAGNOSIS: Increased nutrient needs related to COPD exacerbation as evidenced by MD notes.    Goal: Pt to consume >90% of meals  Monitor:  Weights, labs, intake  Reason for Assessment: consult for assessment   68 y.o. female  Admitting Dx: Dyspnea   ASSESSMENT: Pt developed sudden shortness of breath and cough productive of green/yellow mucus Thursday PTA. C/o shortness of breath and cough since Christmas. Hx of hyperthyroidism, reflux, and anemia.   Met with pt who reports having sore throat for the past week, not really limiting her intake. States she has a good appetite, eats 1-2 meals/day. Was given healthy eating app information from [place]. Weight stable. Ate all of an omlette and raisin bran this morning. Falling asleep at end of visit. Performed nutrition focused physical exam on upper body which was all WNL except for severe muscle wasting in upper arms. Pt reports legs were stronger.    Height: Ht Readings from Last 1 Encounters:  12/21/13 _0  (1.676 m)    Weight: Wt Readings from Last 1 Encounters:  12/21/13 233 lb 0.4 oz (105.7 kg)    Ideal Body Weight: 130 lbs   % Ideal Body Weight: 179%  Wt Readings from Last 10 Encounters:  12/21/13 233 lb 0.4 oz (105.7 kg)  11/23/13 236 lb (107.049 kg)  01/04/12 227 lb 3.2 oz (103.057 kg)  12/07/11 232 lb 5.8 oz (105.4 kg)  12/07/11 232 lb 5.8 oz (105.4 kg)  04/28/10 239 lb 12.8 oz (108.773 kg)    Usual Body Weight: 230 lbs per pt  % Usual Body Weight: 101%  BMI:  Body mass index is 37.63 kg/(m^2). Class II obesity   Estimated Nutritional Needs: Kcal: 1600-1800 Protein: 70-80g Fluid: 1.6-1.8L/day  Skin: WNL  Diet Order: Carb  Control  EDUCATION NEEDS: -No education needs identified at this time   Intake/Output Summary (Last 24 hours) at 12/22/13 1047 Last data filed at 12/22/13 0806  Gross per 24 hour  Intake 815.83 ml  Output    400 ml  Net 415.83 ml    Last BM: PTA  Labs:   Recent Labs Lab 12/21/13 1700 12/22/13 0342  NA 141 140  K 4.1 4.2  CL 102 101  CO2 28 26  BUN 7 9  CREATININE 0.70 0.60  CALCIUM 9.0 9.2  MG  --  2.0  PHOS  --  2.5  GLUCOSE 103* 284*    CBG (last 3)   Recent Labs  12/21/13 2320 12/22/13 0758  GLUCAP 256* 199*    Scheduled Meds: . acetaminophen  650 mg Oral Once  . clonazePAM  0.5 mg Oral QHS  . cyclobenzaprine  10 mg Oral QHS  . docusate sodium  100 mg Oral BID  . DULoxetine  60 mg Oral Daily  . enoxaparin (LOVENOX) injection  40 mg Subcutaneous QHS  . gabapentin  800 mg Oral BID  . guaiFENesin  600 mg Oral BID  . insulin aspart  0-20 Units Subcutaneous TID WC  . insulin aspart  0-5 Units Subcutaneous QHS  . ipratropium  0.5 mg Nebulization QID  . levofloxacin (LEVAQUIN) IV  750 mg Intravenous Q24H  . levothyroxine  25 mcg Oral QAC breakfast  . methylPREDNISolone (SOLU-MEDROL) injection  60 mg Intravenous  Q12H  . mometasone-formoterol  2 puff Inhalation BID  . primidone  50 mg Oral QHS  . sodium chloride  3 mL Intravenous Q12H  . sodium chloride  3 mL Intravenous Q12H    Continuous Infusions:   Past Medical History  Diagnosis Date  . Hyperthyroidism   . Toxic goiter   . Reflux   . Colon polyps   . Osteoarthritis     in back  . Migraines   . Disorder of vocal cords     spasmotic dysphonia  . Obesity, Class III, BMI 40-49.9 (morbid obesity) 04/27/2010  . Pelvic pain   . Anemia   . Clotting disorder   . Neuromuscular disorder     Past Surgical History  Procedure Laterality Date  . Abdominal hysterectomy    . Zenkers diverticulum    . Kidney stone surgery    . Laparoscopic appendectomy  11/27/2011    Procedure: APPENDECTOMY  LAPAROSCOPIC;  Surgeon: Adin Hector, MD;  Location: [facility];  Service: General;  Laterality: N/A;  . Appendectomy    . Cholecystectomy    . Knee arthroscopy  1998    right  . Hand surgery  2004    both hands  . Botox injection      for Henry MS, RD, LDN [phone] Pager [phone] After Hours Pager

## 2013-12-22 NOTE — Progress Notes (Signed)
OT Cancellation Note  Patient Details Name: Hayley [name] MRN: [id number] DOB: May 30, 1946   Cancelled Treatment:    Reason Eval/Treat Not Completed: Patient not medically ready.  MD asked OT to check back tomorrow.  Pt not ready at this time.    Emmalise Huard 12/22/2013, 3:04 PM Lesle Chris, OTR/L [phone] 12/22/2013

## 2013-12-22 NOTE — Progress Notes (Signed)
Pt still c/o insomnia. Called midlevel awaiting call back.

## 2013-12-22 NOTE — Progress Notes (Signed)
RT placed pt on CPAP per home regimine. Pt home setting is 4 cmH2O. Pt has 6 liter O2 bleed into CPAP tubing per oxygen she was already wearing per nasal cannula. Pt is tolerating well. RT will continue to monitor.

## 2013-12-22 NOTE — Progress Notes (Signed)
Pt c/o insomnia called midlevel awaiting call back.

## 2013-12-22 NOTE — Progress Notes (Signed)
Pt with a small splinter in left foot. Will inform RN in the am to pass on to md. Will also add note in md comment section. D/t pt being a DM informed pt that i would not be able to take out splinter. Pt ok with that will await MD in the am.

## 2013-12-22 NOTE — Progress Notes (Signed)
PT Cancellation Note  Patient Details Name: Hayley [name] MRN: [id number] DOB: August 22, 1946   Cancelled Treatment:    Reason Eval/Treat Not Completed: Patient not medically ready-per OT note-MD asked therapy to be held today. Will check back on tomorrow.    Weston Anna, MPT Pager: [phone]

## 2013-12-23 DIAGNOSIS — Z86711 Personal history of pulmonary embolism: Secondary | ICD-10-CM

## 2013-12-23 DIAGNOSIS — Z9989 Dependence on other enabling machines and devices: Secondary | ICD-10-CM

## 2013-12-23 DIAGNOSIS — G4733 Obstructive sleep apnea (adult) (pediatric): Secondary | ICD-10-CM

## 2013-12-23 DIAGNOSIS — K219 Gastro-esophageal reflux disease without esophagitis: Secondary | ICD-10-CM

## 2013-12-23 LAB — GLUCOSE, CAPILLARY
Glucose-Capillary: 195 mg/dL — ABNORMAL HIGH (ref 70–99)
Glucose-Capillary: 262 mg/dL — ABNORMAL HIGH (ref 70–99)
Glucose-Capillary: 157 mg/dL — ABNORMAL HIGH (ref 70–99)
Glucose-Capillary: 170 mg/dL — ABNORMAL HIGH (ref 70–99)

## 2013-12-23 LAB — INFLUENZA PANEL BY PCR (TYPE A & B)
H1N1 flu by pcr: NOT DETECTED
Influenza A By PCR: NEGATIVE
Influenza B By PCR: NEGATIVE

## 2013-12-23 MED ORDER — METHYLPREDNISOLONE SODIUM SUCC 125 MG IJ SOLR
60.0000 mg | Freq: Two times a day (BID) | INTRAMUSCULAR | Status: DC
  Administered 2013-12-23 – 2013-12-24 (×2): 60 mg via INTRAVENOUS
  Filled 2013-12-23 (×3): qty 0.96

## 2013-12-23 MED ORDER — LEVOFLOXACIN 750 MG PO TABS
750.0000 mg | ORAL_TABLET | ORAL | Status: DC
  Administered 2013-12-23 – 2013-12-24 (×2): 750 mg via ORAL
  Filled 2013-12-23 (×3): qty 1

## 2013-12-23 MED ORDER — PANTOPRAZOLE SODIUM 40 MG PO TBEC
40.0000 mg | DELAYED_RELEASE_TABLET | Freq: Every day | ORAL | Status: DC
  Administered 2013-12-23 – 2013-12-26 (×4): 40 mg via ORAL
  Filled 2013-12-23 (×4): qty 1

## 2013-12-23 MED ORDER — SUMATRIPTAN SUCCINATE 6 MG/0.5ML ~~LOC~~ SOLN
6.0000 mg | Freq: Once | SUBCUTANEOUS | Status: AC
  Administered 2013-12-23: 6 mg via SUBCUTANEOUS
  Filled 2013-12-23: qty 0.5

## 2013-12-23 NOTE — Progress Notes (Signed)
Clinical Social Work Department BRIEF PSYCHOSOCIAL ASSESSMENT 12/23/2013  Patient:  Hayley [name], Hayley [name]     Account Number:  [id number]     Admit date:  12/21/2013  Clinical Social Worker:  Earlie Server  Date/Time:  12/23/2013 04:00 PM  Referred by:  Physician  Date Referred:  12/23/2013 Referred for  Other - See comment   Other Referral:   COPD gold protocol   Interview type:  Patient Other interview type:    PSYCHOSOCIAL DATA Living Status:  FAMILY Admitted from facility:   Level of care:   Primary support name:  Arnell Sieving Primary support relationship to patient:  SPOUSE Degree of support available:   Strong    CURRENT CONCERNS Current Concerns  Other - See comment   Other Concerns:   COPD    SOCIAL WORK ASSESSMENT / PLAN CSW received referral in order to complete psychosocial assessment. CSW reviewed chart and met with patient at bedside. CSW introduced myself and explained role.    Patient reports she lives at home with husband and was doing well but started having difficulty breathing and has been having medical problems for the past 3-4 months. Patient reports that she was feeling anxious at home because she was worried about medical problems and being alone while husband was at work. Patient went to PCP who got her admitted to the hospital. Patient reports that she is getting good care and anxiety has decreased.    CSW administered Depression Scale (patient scored 1/27) & Generalized Anxiety Disorder scale (patient scored 2/21). Screening forms placed on shadow chart. Patient is prescribed Klonopin and Cymbalta by PCP and feels that medications are effective. Patient reports that she has great family support and feels that she is managing symptoms well. Patient does not feel she needs psych consult.    Patient plans to return home with family support and spoke about her upcoming trip to [place] to see grandson for the first time. Patient thanked CSW for visit and agreeable to  follow up.   Assessment/plan status:  Psychosocial Support/Ongoing Assessment of Needs Other assessment/ plan:   Depression Scale  Generalized Anxiety Disorder Scale   Information/referral to community resources:   Patient declined psych consult    PATIENT'S/FAMILY'S RESPONSE TO PLAN OF CARE: Patient alert and oriented. Patient sitting up in bed and agreeable to assessment. Patient spoke about important aspects in her life such as visiting her family and is worried that she will not be well enough to travel. Patient appears to have good insight on her anxiety and depression and is managing symptoms well. Patient is not interested in any further outpatient follow up due to having several medical appointments and feeling that medication is effective. Patient thanked CSW for visit and agreeable for CSW to continue to follow.       [place], [place] [phone]

## 2013-12-23 NOTE — Evaluation (Signed)
Physical Therapy Evaluation Patient Details Name: Hayley [name] MRN: [id number] DOB: 1946/08/01 Today's Date: 12/23/2013   History of Present Illness  68 yo female admitted with DM, COPD exac. Hx of obesity, anemia, PE, "Neuromuscular d/o"  Clinical Impression  On eval, pt required supervision level assist for mobility-able to ambulate ~250 feet. O2 sats 89% on 6L O2 while ambulating. Anticipate pt will progress well during stay.     Follow Up Recommendations No PT follow up    Equipment Recommendations  None recommended by PT    Recommendations for Other Services       Precautions / Restrictions Precautions Precaution Comments: monitor vitals Restrictions Weight Bearing Restrictions: No      Mobility  Bed Mobility Overal bed mobility: Modified Independent                Transfers Overall transfer level: Modified independent                  Ambulation/Gait Ambulation/Gait assistance: Supervision Ambulation Distance (Feet): 250 Feet Assistive device: None Gait Pattern/deviations: Decreased stride length     General Gait Details: fair gait speed. Remained on O2.   Stairs            Wheelchair Mobility    Modified Rankin (Stroke Patients Only)       Balance                                             Pertinent Vitals/Pain 93% 5L, HR 110 bpm at rest; 89% 6L, 132 bpm during ambulation; 92% 6L end of session    Home Living Family/patient expects to be discharged to:: Private residence Living Arrangements: Spouse/significant other   Type of Home: House Home Access: Stairs to enter Entrance Stairs-Rails: Left Entrance Stairs-Number of Steps: back-7, front-4 Home Layout: Multi-level;Able to live on main level with bedroom/bathroom Home Equipment: Gilford Rile - 2 wheels      Prior Function Level of Independence: Independent               Hand Dominance        Extremity/Trunk Assessment   Upper Extremity  Assessment: Overall WFL for tasks assessed           Lower Extremity Assessment: Overall WFL for tasks assessed      Cervical / Trunk Assessment: Normal  Communication   Communication: No difficulties  Cognition Arousal/Alertness: Awake/alert Behavior During Therapy: WFL for tasks assessed/performed Overall Cognitive Status: Within Functional Limits for tasks assessed                      General Comments      Exercises        Assessment/Plan    PT Assessment Patient needs continued PT services  PT Diagnosis Difficulty walking   PT Problem List Decreased activity tolerance;Decreased mobility;Cardiopulmonary status limiting activity  PT Treatment Interventions Gait training;Functional mobility training;Therapeutic activities;Therapeutic exercise;Patient/family education;Stair training   PT Goals (Current goals can be found in the Care Plan section) Acute Rehab PT Goals Patient Stated Goal: home soon PT Goal Formulation: With patient Time For Goal Achievement: 01/06/14 Potential to Achieve Goals: Good    Frequency Min 3X/week   Barriers to discharge        Co-evaluation               End of Session Equipment  Utilized During Treatment: Oxygen Activity Tolerance: Patient tolerated treatment well Patient left: in chair;with nursing/sitter in room (pt about to transfer to another floor)           Time: 6553-7482 PT Time Calculation (min): 26 min   Charges:   PT Evaluation $Initial PT Evaluation Tier I: 1 Procedure PT Treatments $Gait Training: 23-37 mins   PT G Codes:          Weston Anna, MPT Pager: [phone]

## 2013-12-23 NOTE — Evaluation (Signed)
Clinical/Bedside Swallow Evaluation Patient Details  Name: Hayley [name] MRN: [id number] Date of Birth: 1946/03/31  Today's Date: 12/23/2013 Time: 1210-1304 SLP Time Calculation (min): 66 min  Past Medical History:  Past Medical History  Diagnosis Date  . Hyperthyroidism   . Toxic goiter   . Reflux   . Colon polyps   . Osteoarthritis     in back  . Migraines   . Disorder of vocal cords     spasmotic dysphonia  . Obesity, Class III, BMI 40-49.9 (morbid obesity) 04/27/2010  . Pelvic pain   . Anemia   . Clotting disorder   . Neuromuscular disorder    Past Surgical History:  Past Surgical History  Procedure Laterality Date  . Abdominal hysterectomy    . Zenkers diverticulum    . Kidney stone surgery    . Laparoscopic appendectomy  11/27/2011    Procedure: APPENDECTOMY LAPAROSCOPIC;  Surgeon: Adin Hector, MD;  Location: [facility];  Service: General;  Laterality: N/A;  . Appendectomy    . Cholecystectomy    . Knee arthroscopy  1998    right  . Hand surgery  2004    both hands  . Botox injection      for migranes   HPI:  67 yo female adm to [facility] with respiratory difficulties.  Swallow evaluation ordered.  Past medical history of Hyperthyroidism; Toxic goiter; Reflux; Colon polyps; Osteoarthritis; Migraines; Disorder of vocal cords; Obesity, Class III, BMI 40-49.9 (morbid obesity) (04/27/2010); Pelvic pain;  Anemia; Clotting disorder; and ? Neuromuscular disorder per chart review. She also has h/o Zenker's diverticulum s/p surgical repair in Oct 2003.  Per pt report, she tried to control respiratory issues with Nebulizers. 3 days ago she was started on Avelox by her ENT office but she continued to keep coughing and feels very worn out. She have had some chest pain worse with coughing.  Pt required 6L to keep O2 >92% per MD note. She uses CPAP with bleed O2 at home every night.   Per Md note, pt was never diagnosed with COPD and her PCP has started her on [facility] for "wheezing".  Hospitalist was called for admission for COPD exacerbation. Patient has a h/o vocal cord dysfunction and she is scheduled to undergo surgery at [facility] on April 28 th 2015 for the vocal cord dysfunction. Pt also has a h/o PE in 2013 and on anticoagulation for one year. CT angio chest was negative for PE, debris right lower lobe-could not rule out aspiration.     Assessment / Plan / Recommendation Clinical Impression  Bedside evaluation of swallow completed, full report to follow.  Pt with no clinical indications of aspiration with full lunch meal observed with timely swallow and clear voice.    Pt has h/o Zenker's s/p surgical repair in 2003 - reported symptoms before Zenker's repair was reflux only - which pt denies symptoms currently.  Pt reports taking reflux medications until 2012 when she ran out of samples from Dr Collene Mares.     Regarding oropharyngeal swallow ability, pt does not appear at high risk - she does admit to occasional shortness of breath with exertion and therefore my episodically aspirate.  Can not rule out overt silent aspiration at bedside but doubt oropharyngeal aspiration present.    Recommend regular/thin diet continue with general aspiration/reflux precautions.  SLP to sign off as all education completed.      Aspiration Risk  Mild    Diet Recommendation Regular;Thin liquid   Liquid Administration  via: Cup;Straw Medication Administration: Whole meds with liquid Supervision: Patient able to self feed Compensations: Slow rate;Small sips/bites Postural Changes and/or Swallow Maneuvers: Seated upright 90 degrees;Upright 30-60 min after meal    Other  Recommendations Recommended Consults: Consider esophageal assessment;Consider GI evaluation (if concern present for esophageal dysphagia contributing to aspiration risk) Oral Care Recommendations: Oral care BID   Follow Up Recommendations  None    Frequency and Duration   n/a     Pertinent Vitals/Pain Afebrile, decreased      Swallow Study Prior Functional Status  Type of Home: House    General Date of Onset: 12/23/13 HPI: 68 yo female adm to [facility] with respiratory difficulties.  Swallow evaluation ordered.  Past medical history of Hyperthyroidism; Toxic goiter; Reflux; Colon polyps; Osteoarthritis; Migraines; Disorder of vocal cords; Obesity, Class III, BMI 40-49.9 (morbid obesity) (04/27/2010); Pelvic pain;  Anemia; Clotting disorder; and ? Neuromuscular disorder per chart review. She also has h/o Zenker's diverticulum s/p surgical repair in Oct 2003.  Per pt report, she tried to control respiratory issues with Nebulizers. 3 days ago she was started on Avelox by her ENT office but she continued to keep coughing and feels very worn out. She have had some chest pain worse with coughing.  Pt required 6L to keep O2 >92% per MD note. She uses CPAP with bleed O2 at home every night.   Per Md note, pt was never diagnosed with COPD and her PCP has started her on [facility] for "wheezing". Hospitalist was called for admission for COPD exacerbation. Patient has a h/o vocal cord dysfunction and she is scheduled to undergo surgery at [facility] on April 28 th 2015 for the vocal cord dysfunction. Pt also has a h/o PE in 2013 and on anticoagulation for one year. CT angio chest was negative for PE, debris right lower lobe-could not rule out aspiration.   Type of Study: Bedside swallow evaluation Previous Swallow Assessment: EGD 2011 hiatal hernia, otherwise negative exam, h/o 3-4 cm Zenker's diverticulum in Oct 2003, 04/2002 Zenker's diverticulum without stricture=adequate passage of barium tablet - normal swallow mechanism per barium swallow, Oct 2003 [facility] AFTER ZENKER'S DIVERTICULECTOMY. 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 [place]

## 2013-12-23 NOTE — Progress Notes (Signed)
Bedside evaluation of swallow completed, full report to follow.  Pt with no clinical indications of aspiration with full lunch meal observed with timely swallow and clear voice.  Pt has h/o Zenker's s/p surgical repair in 2003 - reported symptoms before Zenker's repair was reflux only - which pt denies symptoms currently.  Pt reports taking reflux medications until 2012 when she ran out of samples from Dr Collene Mares.    Re: oropharyngeal swallow ability, pt does not appear at high risk - she does admit to occasional shortness of breath with exertion and therefore my episodically aspirate.  Can not rule out overt silent aspiration at bedside but doubt oropharyngeal aspiration present.    SLP educated pt to general aspiration precautions given current respiratory issue.  Will sign off. Thanks for this referral.   Luanna Salk, [facility] SLP [phone]

## 2013-12-23 NOTE — Progress Notes (Addendum)
[facility] PROGRESS NOTE  RAYVIN ABID WUJ:[id number] DOB: [birth year] DOA: 12/21/2013 PCP: Tommy Medal, MD   Brief HPI:  Hayley [name] is a 68 y.o. female with  past medical history of Hyperthyroidism; Toxic goiter; Reflux; Colon polyps; Osteoarthritis; Migraines; Disorder of vocal cords; Obesity, Class III, BMI 40-49.9 (morbid obesity) (04/27/2010); Pelvic pain; Anemia; Clotting disorder; and Neuromuscular disorder. Presented with  some shortness of breath and cough. She tried to control this with Nebulizers. 3 days ago she was started on Avelox by her ENT office but she continues to keep coughing and feels very worn out. She have had some chest pain worse with coughing. She presented to ER and was found to be hypoxic down to 80%. She requiring 6L to keep O2 >92% . She uses CPAP with bleed O2 at home every night. She was never diagnosed with COPD was her PCP has started her on [facility] for "wheezing". Hospitalist was called for admission for COPD exacerbation. Patient has a h/o vocal cord dysfunction and she is scheduled to undergo surgery at [facility] on April 28 th 2015 for the vocal cord dysfunction. She is supposed to fly to [place] on April 18th 2015 for the surgery. She also has a h/o PE in 2013 and on anticoagulation for one year. Her D dimer came back mildly elevated, CT angio chest was negative for PE. Checking influenza.    Assessment/Plan:  Acute Respiratory failure  -Do not believe she has COPD (says her pulmonologist at [facility] has done PFTs twice). No wheezing on exam. - Suspect her vocal cord dysfunction is contributing to her respiratory issues.  On top of her baseline OSA. -She may also have some low-grade aspiration related to her vocal cord dysfunction and possibly some reflux as well. -Start PPI. -Check flu PCR.  -She was started on IV steroids (will start titrating 4/8), bronchodilators and nasal oxygen to keep sats >90% .  -Will DC brovana and pulmicort as she has no h/o COPD  and no wheezing on exam today. -Encourage mobilization with PT/OT. -Transfer to the floor today.  OSA -qHs CPAP.   Diabetes Mellitus; - uncontrolled cbg's with solumedrol.  - resistant scale SSI - small dose of lantus and TIDAC novolog.  - hgba1c is 7.2  Elevated D dimer: - CT chest negative for PE.   Morbid Obesity  Tachycardia: Related to her coughing spells and her increased work of breathing.  Sinus tachycardia on the monitor.   Spasmodic dysphonia: - scheduled for surgery later this month at [facility].       Code Status: full code Family Communication: none at bedside Disposition Plan: Transfer to floor.   Consultants:  none  Procedures:  none  Antibiotics:  levaquin 4/6  HPI/Subjective: Coughing, reports using cough medication with codeine.   Objective: Filed Vitals:   12/23/13 0800  BP:   Pulse:   Temp: 98.4 F (36.9 C)  Resp:     Intake/Output Summary (Last 24 hours) at 12/23/13 1045 Last data filed at 12/23/13 0700  Gross per 24 hour  Intake   1210 ml  Output   2551 ml  Net  -1341 ml   Filed Weights   12/21/13 2251  Weight: 105.7 kg (233 lb 0.4 oz)    Exam:   General:  Alert afebrile  Cardiovascular: s1s2 tachycardic  Respiratory: clear, no wheezing  Abdomen: soft NT ND BS+  Musculoskeletal: no pedal edema.   Data Reviewed: Basic Metabolic Panel:  Recent Labs Lab 12/21/13 1700 12/22/13 0342  NA 141 140  K 4.1 4.2  CL 102 101  CO2 28 26  GLUCOSE 103* 284*  BUN 7 9  CREATININE 0.70 0.60  CALCIUM 9.0 9.2  MG  --  2.0  PHOS  --  2.5   Liver Function Tests:  Recent Labs Lab 12/22/13 0342  AST 16  ALT 14  ALKPHOS 80  BILITOT <0.2*  PROT 7.0  ALBUMIN 3.4*   No results found for this basename: LIPASE, AMYLASE,  in the last 168 hours No results found for this basename: AMMONIA,  in the last 168 hours CBC:  Recent Labs Lab 12/21/13 1700 12/22/13 0342  WBC 7.8 5.9  NEUTROABS 5.0  --   HGB 12.8 12.9   HCT 40.9 40.7  MCV 89.3 88.3  PLT 282 282   Cardiac Enzymes:  Recent Labs Lab 12/21/13 2158 12/22/13 0342 12/22/13 1017  TROPONINI <0.30 <0.30 <0.30   BNP (last 3 results)  Recent Labs  12/21/13 1700  PROBNP 82.3   CBG:  Recent Labs Lab 12/22/13 0758 12/22/13 1220 12/22/13 1627 12/22/13 2209 12/23/13 0755  GLUCAP 199* 304* 252* 267* 195*    Recent Results (from the past 240 hour(s))  MRSA PCR SCREENING     Status: None   Collection Time    12/21/13 11:06 PM      Result Value Ref Range Status   MRSA by PCR NEGATIVE  NEGATIVE Final   Comment:            The GeneXpert MRSA Assay (FDA     approved for NASAL specimens     only), is one component of a     comprehensive MRSA colonization     surveillance program. It is not     intended to diagnose MRSA     infection nor to guide or     monitor treatment for     MRSA infections.     Studies: Dg Chest 2 View  12/21/2013   CLINICAL DATA:  Cough.  Wheezing.  Short of breath.  Weakness.  EXAM: CHEST  2 VIEW  COMPARISON:  DG CHEST 2V dated 11/19/2013  FINDINGS: Cardiopericardial silhouette within normal limits. Mediastinal contours normal. Trachea midline. No airspace disease or effusion. Suboptimal penetration due to obese body habitus. Basilar atelectasis. Monitoring leads project over the chest.  IMPRESSION: No active cardiopulmonary disease.   Electronically Signed   By: Dereck Ligas M.D.   On: 12/21/2013 16:37   Ct Angio Chest Pe W/cm &/or Wo Cm  12/22/2013   CLINICAL DATA:  Shortness of breath.  Rule out pulmonary embolism.  EXAM: CT ANGIOGRAPHY CHEST WITH CONTRAST  TECHNIQUE: Multidetector CT imaging of the chest was performed using the standard protocol during bolus administration of intravenous contrast. Multiplanar CT image reconstructions and MIPs were obtained to evaluate the vascular anatomy.  CONTRAST:  19mL OMNIPAQUE IOHEXOL 350 MG/ML SOLN  COMPARISON:  12/06/2011  FINDINGS: Lungs are adequately inflated and  demonstrate linear atelectasis over the medial posterior right lower lobe. There is possible minimal debris within the proximal right lower lobe bronchus. There is minimal linear scarring versus atelectasis over the left lower lobe. There is no evidence of effusion. Note that the extreme lung apices are excluded from the field-of-view.  Heart is normal size. There is calcified plaque over the left and percent descending coronary artery. There is no evidence of pulmonary embolism. Remaining mediastinal structures are within normal.  Images through the upper abdomen are unremarkable. There are degenerative changes of  the spine.  Review of the MIP images confirms the above findings.  IMPRESSION: No evidence of pulmonary embolism. No acute cardiopulmonary disease.  Possible minimal debris within the proximal right lower lobe bronchus. Linear consolidation over the posterior medial right lower lobe most typical of atelectasis, although cannot completely exclude infection secondary to aspiration. Minimal scarring versus linear atelectasis over the left lower lobe.  Mild atherosclerotic coronary artery disease.   Electronically Signed   By: Marin Olp M.D.   On: 12/22/2013 19:01    Scheduled Meds: . acetaminophen  650 mg Oral Once  . arformoterol  15 mcg Nebulization BID  . budesonide (PULMICORT) nebulizer solution  0.25 mg Nebulization BID  . clonazePAM  0.5 mg Oral QHS  . cyclobenzaprine  10 mg Oral QHS  . docusate sodium  100 mg Oral BID  . DULoxetine  60 mg Oral Daily  . enoxaparin (LOVENOX) injection  40 mg Subcutaneous QHS  . gabapentin  800 mg Oral BID  . insulin aspart  0-20 Units Subcutaneous TID WC  . insulin aspart  0-5 Units Subcutaneous QHS  . insulin aspart  4 Units Subcutaneous TID WC  . insulin glargine  5 Units Subcutaneous QHS  . ipratropium  0.5 mg Nebulization Q6H  . levalbuterol  0.63 mg Nebulization Q6H  . levofloxacin (LEVAQUIN) IV  750 mg Intravenous Q24H  . levothyroxine  25  mcg Oral QAC breakfast  . methylPREDNISolone (SOLU-MEDROL) injection  60 mg Intravenous Q12H  . primidone  50 mg Oral QHS  . sodium chloride  3 mL Intravenous Q12H  . sodium chloride  3 mL Intravenous Q12H   Continuous Infusions:   Active Problems:   DIABETES MELLITUS, TYPE II   Hypoxemia   COPD exacerbation   History of pulmonary embolus (PE)    Time spent: 35 minutes    [place] [facility] Pager [phone] If 7PM-7AM, please contact night-coverage at [email or web address], password [facility] 12/23/2013, 10:45 AM  LOS: 2 days

## 2013-12-23 NOTE — Progress Notes (Signed)
OT Cancellation Note  Patient Details Name: [name] MRN: [id number] DOB: 1946-09-10   Cancelled Treatment:    Reason Eval/Treat Not Completed: OT screened, no needs identified, will sign off.  Pt walking in hall with PT.  PT reports she was able to get herself to toilet and don socks.  Pt does not feel she needs DME.  She has a tub and usually sits at bottom.  Recommended husband be there with her when she first tries it.   Lesle Chris 12/23/2013, 2:36 PM

## 2013-12-24 DIAGNOSIS — D649 Anemia, unspecified: Secondary | ICD-10-CM

## 2013-12-24 LAB — BLOOD GAS, ARTERIAL
Acid-Base Excess: 0.9 mmol/L (ref 0.0–2.0)
Bicarbonate: 23.9 mEq/L (ref 20.0–24.0)
Drawn by: 331471
O2 Content: 5 L/min
O2 Saturation: 98.4 %
Patient temperature: 98.6
TCO2: 21 mmol/L (ref 0–100)
pCO2 arterial: 34.8 mmHg — ABNORMAL LOW (ref 35.0–45.0)
pH, Arterial: 7.452 — ABNORMAL HIGH (ref 7.350–7.450)
pO2, Arterial: 113 mmHg — ABNORMAL HIGH (ref 80.0–100.0)

## 2013-12-24 LAB — CBC
HCT: 39.2 % (ref 36.0–46.0)
Hemoglobin: 12.6 g/dL (ref 12.0–15.0)
MCH: 28.3 pg (ref 26.0–34.0)
MCHC: 32.1 g/dL (ref 30.0–36.0)
MCV: 88.1 fL (ref 78.0–100.0)
Platelets: 271 10*3/uL (ref 150–400)
RBC: 4.45 MIL/uL (ref 3.87–5.11)
RDW: 15.4 % (ref 11.5–15.5)
WBC: 12.1 10*3/uL — ABNORMAL HIGH (ref 4.0–10.5)

## 2013-12-24 LAB — BASIC METABOLIC PANEL
BUN: 18 mg/dL (ref 6–23)
CO2: 27 mEq/L (ref 19–32)
Calcium: 8.8 mg/dL (ref 8.4–10.5)
Chloride: 101 mEq/L (ref 96–112)
Creatinine, Ser: 0.54 mg/dL (ref 0.50–1.10)
GFR calc Af Amer: >90 mL/min (ref >90)
GFR calc non Af Amer: >90 mL/min (ref >90)
Glucose, Bld: 201 mg/dL — ABNORMAL HIGH (ref 70–99)
Potassium: 4.4 mEq/L (ref 3.7–5.3)
Sodium: 139 mEq/L (ref 137–147)

## 2013-12-24 LAB — GLUCOSE, CAPILLARY
Glucose-Capillary: 156 mg/dL — ABNORMAL HIGH (ref 70–99)
Glucose-Capillary: 196 mg/dL — ABNORMAL HIGH (ref 70–99)
Glucose-Capillary: 208 mg/dL — ABNORMAL HIGH (ref 70–99)
Glucose-Capillary: 252 mg/dL — ABNORMAL HIGH (ref 70–99)

## 2013-12-24 MED ORDER — PREDNISONE 50 MG PO TABS
60.0000 mg | ORAL_TABLET | Freq: Every day | ORAL | Status: DC
  Administered 2013-12-25: 60 mg via ORAL
  Filled 2013-12-24 (×2): qty 1

## 2013-12-24 NOTE — Progress Notes (Signed)
Clinical Social Work  CSW met with patient in order to provide support during hospitalization. Patient reports she slept well last night and is feeling better today. Patient does have questions for the MD that she has written down and reports she is anxious to get answers. CSW and patient spoke about her support and emotional wellbeing. Patient reports that family is continuing to call and check on her but that she's "a loner and I do well on my own." Patient reports since husband works during the day she often has long periods of time by herself. Patient reports she is a strong woman and religious so she uses her time alone to pray. CSW spoke with patient about using praying as a coping mechanism. Patient reports a strong support of friends that pray with her as well. Patient reports she is in good spirits and knows that God will take care of her. Patient very engaged during session and thanked CSW for visit. CSW will continue to follow and reminded patient of chaplain services if she desired someone to pray with her.  [place], [place] [phone]

## 2013-12-24 NOTE — Progress Notes (Addendum)
[facility] PROGRESS NOTE  THALIA TURKINGTON CBJ:[id number] DOB: 1946/09/17 DOA: 12/21/2013 PCP: Tommy Medal, MD   Brief HPI:  Hayley [name] is a 68 y.o. female with  past medical history of Hyperthyroidism; Toxic goiter; Reflux; Colon polyps; Osteoarthritis; Migraines; Disorder of vocal cords; Obesity, Class III, BMI 40-49.9 (morbid obesity) (04/27/2010); Pelvic pain; Anemia; Clotting disorder; and Neuromuscular disorder. Presented with  some shortness of breath and cough. She tried to control this with Nebulizers. 3 days ago she was started on Avelox by her ENT office but she continues to keep coughing and feels very worn out. She have had some chest pain worse with coughing. She presented to ER and was found to be hypoxic down to 80%. She requiring 6L to keep O2 >92% . She uses CPAP with bleed O2 at home every night. She was never diagnosed with COPD was her PCP has started her on [facility] for "wheezing". Hospitalist was called for admission for COPD exacerbation. Patient has a h/o vocal cord dysfunction and she is scheduled to undergo surgery at [facility] on April 28 th 2015 for the vocal cord dysfunction. She is supposed to fly to [place] on April 18th 2015 for the surgery. She also has a h/o PE in 2013 and on anticoagulation for one year. Her D dimer came back mildly elevated, CT angio chest was negative for PE. Checking influenza.    Assessment/Plan:  Acute Respiratory failure  -Do not believe she has COPD (says her pulmonologist at [facility] has done PFTs twice). No wheezing on exam. Requesting records from [facility] (4/9). - Suspect her vocal cord dysfunction is contributing to her respiratory issues.  On top of her baseline OSA. -She may also have some low-grade aspiration related to her vocal cord dysfunction and possibly some reflux as well. -Start PPI. -Check flu PCR (negative).  -She was started on IV steroids (will start titrating 4/8), bronchodilators and nasal oxygen to keep sats >90% .  -Will  DC brovana and pulmicort as she has no h/o COPD and no wheezing on exam today. -Encourage mobilization with PT/OT. -Will need home oxygen. -Check ECHO/ABG to evaluate hypoxemia.   OSA -qHs CPAP.   Diabetes Mellitus; - uncontrolled cbg's with solumedrol.  - resistant scale SSI - small dose of lantus and TIDAC novolog.  - hgba1c is 7.2 -Will start improving as we continue to titrate steroids.  Elevated D dimer: - CT chest negative for PE.   Morbid Obesity  Tachycardia: Related to her coughing spells and her increased work of breathing.  Sinus tachycardia on the monitor.   Spasmodic dysphonia: - scheduled for surgery later this month at [facility].       Code Status: full code Family Communication: none at bedside Disposition Plan: Home in 1-2 days.   Consultants:  none  Procedures:  none  Antibiotics:  levaquin 4/6  HPI/Subjective: Coughing, reports using cough medication with codeine.   Objective: Filed Vitals:   12/24/13 1340  BP:   Pulse: 110  Temp:   Resp:     Intake/Output Summary (Last 24 hours) at 12/24/13 1537 Last data filed at 12/24/13 0500  Gross per 24 hour  Intake    660 ml  Output      0 ml  Net    660 ml   Filed Weights   12/21/13 2251 12/23/13 1505  Weight: 105.7 kg (233 lb 0.4 oz) 108.8 kg (239 lb 13.8 oz)    Exam:   General:  Alert afebrile  Cardiovascular: s1s2 tachycardic  Respiratory: clear, no wheezing  Abdomen: soft NT ND BS+  Musculoskeletal: no pedal edema.   Data Reviewed: Basic Metabolic Panel:  Recent Labs Lab 12/21/13 1700 12/22/13 0342 12/24/13 0613  NA 141 140 139  K 4.1 4.2 4.4  CL 102 101 101  CO2 28 26 27   GLUCOSE 103* 284* 201*  BUN 7 9 18   CREATININE 0.70 0.60 0.54  CALCIUM 9.0 9.2 8.8  MG  --  2.0  --   PHOS  --  2.5  --    Liver Function Tests:  Recent Labs Lab 12/22/13 0342  AST 16  ALT 14  ALKPHOS 80  BILITOT <0.2*  PROT 7.0  ALBUMIN 3.4*   No results found for this  basename: LIPASE, AMYLASE,  in the last 168 hours No results found for this basename: AMMONIA,  in the last 168 hours CBC:  Recent Labs Lab 12/21/13 1700 12/22/13 0342 12/24/13 0613  WBC 7.8 5.9 12.1*  NEUTROABS 5.0  --   --   HGB 12.8 12.9 12.6  HCT 40.9 40.7 39.2  MCV 89.3 88.3 88.1  PLT 282 282 271   Cardiac Enzymes:  Recent Labs Lab 12/21/13 2158 12/22/13 0342 12/22/13 1017  TROPONINI <0.30 <0.30 <0.30   BNP (last 3 results)  Recent Labs  12/21/13 1700  PROBNP 82.3   CBG:  Recent Labs Lab 12/23/13 1644 12/23/13 2000 12/24/13 0008 12/24/13 0809 12/24/13 1142  GLUCAP 157* 170* 208* 156* 252*    Recent Results (from the past 240 hour(s))  MRSA PCR SCREENING     Status: None   Collection Time    12/21/13 11:06 PM      Result Value Ref Range Status   MRSA by PCR NEGATIVE  NEGATIVE Final   Comment:            The GeneXpert MRSA Assay (FDA     approved for NASAL specimens     only), is one component of a     comprehensive MRSA colonization     surveillance program. It is not     intended to diagnose MRSA     infection nor to guide or     monitor treatment for     MRSA infections.     Studies: Ct Angio Chest Pe W/cm &/or Wo Cm  12/22/2013   CLINICAL DATA:  Shortness of breath.  Rule out pulmonary embolism.  EXAM: CT ANGIOGRAPHY CHEST WITH CONTRAST  TECHNIQUE: Multidetector CT imaging of the chest was performed using the standard protocol during bolus administration of intravenous contrast. Multiplanar CT image reconstructions and MIPs were obtained to evaluate the vascular anatomy.  CONTRAST:  187mL OMNIPAQUE IOHEXOL 350 MG/ML SOLN  COMPARISON:  12/06/2011  FINDINGS: Lungs are adequately inflated and demonstrate linear atelectasis over the medial posterior right lower lobe. There is possible minimal debris within the proximal right lower lobe bronchus. There is minimal linear scarring versus atelectasis over the left lower lobe. There is no evidence of  effusion. Note that the extreme lung apices are excluded from the field-of-view.  Heart is normal size. There is calcified plaque over the left and percent descending coronary artery. There is no evidence of pulmonary embolism. Remaining mediastinal structures are within normal.  Images through the upper abdomen are unremarkable. There are degenerative changes of the spine.  Review of the MIP images confirms the above findings.  IMPRESSION: No evidence of pulmonary embolism. No acute cardiopulmonary disease.  Possible minimal debris within the proximal right lower lobe  bronchus. Linear consolidation over the posterior medial right lower lobe most typical of atelectasis, although cannot completely exclude infection secondary to aspiration. Minimal scarring versus linear atelectasis over the left lower lobe.  Mild atherosclerotic coronary artery disease.   Electronically Signed   By: Marin Olp M.D.   On: 12/22/2013 19:01    Scheduled Meds: . acetaminophen  650 mg Oral Once  . clonazePAM  0.5 mg Oral QHS  . cyclobenzaprine  10 mg Oral QHS  . docusate sodium  100 mg Oral BID  . DULoxetine  60 mg Oral Daily  . enoxaparin (LOVENOX) injection  40 mg Subcutaneous QHS  . gabapentin  800 mg Oral BID  . insulin aspart  0-20 Units Subcutaneous TID WC  . insulin aspart  0-5 Units Subcutaneous QHS  . insulin aspart  4 Units Subcutaneous TID WC  . insulin glargine  5 Units Subcutaneous QHS  . ipratropium  0.5 mg Nebulization Q6H  . levalbuterol  0.63 mg Nebulization Q6H  . levofloxacin  750 mg Oral Q24H  . levothyroxine  25 mcg Oral QAC breakfast  . methylPREDNISolone (SOLU-MEDROL) injection  60 mg Intravenous Q12H  . pantoprazole  40 mg Oral Daily  . primidone  50 mg Oral QHS  . sodium chloride  3 mL Intravenous Q12H  . sodium chloride  3 mL Intravenous Q12H   Continuous Infusions:   Active Problems:   DIABETES MELLITUS, TYPE II   Hypoxemia   COPD exacerbation   History of pulmonary embolus  (PE)   OSA on CPAP    Time spent: 35 minutes    [place] [facility] Pager [phone] If 7PM-7AM, please contact night-coverage at [email or web address], password [facility] 12/24/2013, 3:37 PM  LOS: 3 days

## 2013-12-24 NOTE — Progress Notes (Signed)
Physical Therapy Treatment Patient Details Name: Hayley [name] MRN: [id number] DOB: 12/17/1945 Today's Date: 12/24/2013    History of Present Illness 68 yo female admitted with DM, COPD exac.    PT Comments    Pt stated she was "feeling better" and admitted getting OOB to BR w/o assist.  Advised pt to call for safety and not to remove her O2 to do so.  Pt just shrugged it off.  Amb in hallway on 6 lts avg stats 91%.  "I'm pretty tough" pt stated. Pt appears at base line.  Follow Up Recommendations  No PT follow up     Equipment Recommendations       Recommendations for Other Services       Precautions / Restrictions Precautions Precaution Comments: monitor vitals Restrictions Weight Bearing Restrictions: No    Mobility  Bed Mobility Overal bed mobility: Modified Independent             General bed mobility comments: increased time  Transfers Overall transfer level: Modified independent Equipment used: Rolling walker (2 wheeled)             General transfer comment: increased time  Ambulation/Gait Ambulation/Gait assistance: Supervision Ambulation Distance (Feet): 255 Feet Assistive device: Rolling walker (2 wheeled)   Gait velocity: decreased   General Gait Details: used RW for increased safety and energy conservation.  Amb on 6 lts nasal sats avg 91%.  Required X 2 standing rest breaks.   Stairs            Wheelchair Mobility    Modified Rankin (Stroke Patients Only)       Balance                                    Cognition                            Exercises      General Comments        Pertinent Vitals/Pain     Home Living                      Prior Function            PT Goals (current goals can now be found in the care plan section) Progress towards PT goals: Progressing toward goals    Frequency  Min 3X/week    PT Plan      Co-evaluation             End of Session  Equipment Utilized During Treatment: Oxygen Activity Tolerance: Patient tolerated treatment well Patient left: in bed;with call bell/phone within reach     Time: 0953-1017 PT Time Calculation (min): 24 min  Charges:  $Gait Training: 8-22 mins $Therapeutic Activity: 8-22 mins                    G Codes:      Rica Koyanagi  PTA [facility]  Acute  Rehab Pager      [phone]

## 2013-12-25 LAB — GLUCOSE, CAPILLARY
Glucose-Capillary: 162 mg/dL — ABNORMAL HIGH (ref 70–99)
Glucose-Capillary: 111 mg/dL — ABNORMAL HIGH (ref 70–99)
Glucose-Capillary: 162 mg/dL — ABNORMAL HIGH (ref 70–99)
Glucose-Capillary: 254 mg/dL — ABNORMAL HIGH (ref 70–99)
Glucose-Capillary: 109 mg/dL — ABNORMAL HIGH (ref 70–99)

## 2013-12-25 LAB — BASIC METABOLIC PANEL
BUN: 18 mg/dL (ref 6–23)
CO2: 29 mEq/L (ref 19–32)
Calcium: 8.6 mg/dL (ref 8.4–10.5)
Chloride: 104 mEq/L (ref 96–112)
Creatinine, Ser: 0.72 mg/dL (ref 0.50–1.10)
GFR calc Af Amer: >90 mL/min (ref >90)
GFR calc non Af Amer: 86 mL/min — ABNORMAL LOW (ref >90)
Glucose, Bld: 122 mg/dL — ABNORMAL HIGH (ref 70–99)
Potassium: 4.1 mEq/L (ref 3.7–5.3)
Sodium: 142 mEq/L (ref 137–147)

## 2013-12-25 LAB — CBC
HCT: 39.2 % (ref 36.0–46.0)
Hemoglobin: 12.5 g/dL (ref 12.0–15.0)
MCH: 27.7 pg (ref 26.0–34.0)
MCHC: 31.9 g/dL (ref 30.0–36.0)
MCV: 86.9 fL (ref 78.0–100.0)
Platelets: 247 10*3/uL (ref 150–400)
RBC: 4.51 MIL/uL (ref 3.87–5.11)
RDW: 15.4 % (ref 11.5–15.5)
WBC: 9.2 10*3/uL (ref 4.0–10.5)

## 2013-12-25 LAB — TROPONIN I: Troponin I: <0.3 ng/mL (ref <0.30)

## 2013-12-25 MED ORDER — IPRATROPIUM-ALBUTEROL 0.5-2.5 (3) MG/3ML IN SOLN
3.0000 mL | Freq: Three times a day (TID) | RESPIRATORY_TRACT | Status: DC
  Administered 2013-12-25 (×3): 3 mL via RESPIRATORY_TRACT
  Filled 2013-12-25 (×3): qty 3

## 2013-12-25 MED ORDER — PREDNISONE 50 MG PO TABS
50.0000 mg | ORAL_TABLET | Freq: Every day | ORAL | Status: DC
  Administered 2013-12-26: 50 mg via ORAL
  Filled 2013-12-25 (×2): qty 1

## 2013-12-25 NOTE — Care Management Note (Unsigned)
    Page 1 of 1   12/25/2013     3:39:14 PM   CARE MANAGEMENT NOTE 12/25/2013  Patient:  Hayley [name], Hayley [name]   Account Number:  [id number]  Date Initiated:  12/25/2013  Documentation initiated by:  [facility]  Subjective/Objective Assessment:   68 year old female admitted with hypoxia and dypsnea.     Action/Plan:   From home.   Anticipated DC Date:  12/28/2013   Anticipated DC Plan:  [place]  CM consult      Choice offered to / List presented to:     DME arranged  [facility]      DME agency  [facility]        Status of service:  In process, will continue to follow Medicare Important Message given?  NA - LOS <3 / Initial given by admissions (If response is "NO", the following Medicare IM given date fields will be blank) Date Medicare IM given:   Date Additional Medicare IM given:    Discharge Disposition:    Per UR Regulation:  Reviewed for med. necessity/level of care/duration of stay  If discussed at [place] of Stay Meetings, dates discussed:    Comments:  12/25/13 Allene Dillon RN BSN [phone] Pt now needs oxygen continuously. Orders faxed to [facility]. They will deliver a tank today or pt to use for travel home.  Pt will be travelling to [place] in 10 days. I spoke to [facility] about it. They informed me that they will assist in making arrangements for her. I have advised pt to call them once she is discharged to start the process.

## 2013-12-25 NOTE — Progress Notes (Signed)
[facility] hospital[facility] progress note. Chief complaint. Chest pain. History of present illness. This 68 year old female in hospital with acute respiratory failure thought secondary to vocal cord dysfunction and OSA. Complaints to nursing of central chest pain. An EKG was obtained and does not look significantly different than her prior EKG. She describes a burning pain in the epigastric area. He is able to duplicate the pain with palpation. She denies radiation, diaphoresis, or nausea. She does describe a headache. Vital signs. Temperature 98.9, pulse 82, respiration 19, blood pressure 127/79. O2 sats 96%. General appearance. Obese elderly female who is alert and in no distress. Cardiac. Regular rate and rhythm. No calf pain and negative Homans. Lungs. Globally decreased but clear without distress. O2 sat stable. Abdomen. Soft with positive bowel sounds. There is some epigastric pain with palpation. Patient states this duplicates the current chest pain. Impression/plan. Problem #1. Epigastric pain/chest pain. EKG does not look significantly changed from prior EKG. Had negative troponins earlier in hospitalization will repeat troponins every 6 hours for a total of 3 sets. Recheck EKG in 6 hours to evaluate for any changes.

## 2013-12-25 NOTE — Progress Notes (Signed)
Physical Therapy Treatment Patient Details Name: PRECIOUS SEGALL MRN: [id number] DOB: November 19, 1945 Today's Date: 12/25/2013    History of Present Illness 68 yo female admitted with DM, COPD exac.    PT Comments    Pt feeling better.  Amb in hallway without any AD on 6 lts nasal sats avg 94%.  Pt hoping to D/C to home tomorrow.  Follow Up Recommendations  No PT follow up     Equipment Recommendations  None recommended by PT    Recommendations for Other Services       Precautions / Restrictions Precautions Precaution Comments: monitor vitals Restrictions Weight Bearing Restrictions: No    Mobility  Bed Mobility Overal bed mobility: Modified Independent             General bed mobility comments: increased time  Transfers Overall transfer level: Modified independent Equipment used: None             General transfer comment: no AD needed this session.  Good balance and no LOB.  Ambulation/Gait Ambulation/Gait assistance: Modified independent (Device/Increase time) Ambulation Distance (Feet): 145 Feet Assistive device: None Gait Pattern/deviations: Step-through pattern Gait velocity: decreased   General Gait Details: amb w/o AD on 6 lts O2 sats avg 94% with only 1/4 DOE.  Only one standing rest break.  Mild cough.   Stairs            Wheelchair Mobility    Modified Rankin (Stroke Patients Only)       Balance                                    Cognition                            Exercises      General Comments        Pertinent Vitals/Pain     Home Living                      Prior Function            PT Goals (current goals can now be found in the care plan section) Progress towards PT goals: Progressing toward goals    Frequency  Min 3X/week    PT Plan      Co-evaluation             End of Session Equipment Utilized During Treatment: Oxygen Activity Tolerance: Patient tolerated  treatment well Patient left: in chair;with call bell/phone within reach     Time: 1346-1410 PT Time Calculation (min): 24 min  Charges:  $Gait Training: 8-22 mins $Therapeutic Activity: 8-22 mins                    G Codes:      Rica Koyanagi  PTA [facility]  Acute  Rehab Pager      [phone]

## 2013-12-25 NOTE — Progress Notes (Signed)
[facility] PROGRESS NOTE  KATELIN KUTSCH QQV:[id number] DOB: 1946-04-23 DOA: 12/21/2013 PCP: Tommy Medal, MD   Brief HPI:  Hayley [name] is a 68 y.o. female with  past medical history of Hyperthyroidism; Toxic goiter; Reflux; Colon polyps; Osteoarthritis; Migraines; Disorder of vocal cords; Obesity, Class III, BMI 40-49.9 (morbid obesity) (04/27/2010); Pelvic pain; Anemia; Clotting disorder; and Neuromuscular disorder. Presented with  some shortness of breath and cough. She tried to control this with Nebulizers. 3 days ago she was started on Avelox by her ENT office but she continues to keep coughing and feels very worn out. She have had some chest pain worse with coughing. She presented to ER and was found to be hypoxic down to 80%. She requiring 6L to keep O2 >92% . She uses CPAP with bleed O2 at home every night. She was never diagnosed with COPD was her PCP has started her on [facility] for "wheezing". Hospitalist was called for admission for COPD exacerbation. Patient has a h/o vocal cord dysfunction and she is scheduled to undergo surgery at [facility] on April 28 th 2015 for the vocal cord dysfunction. She is supposed to fly to [place] on April 18th 2015 for the surgery. She also has a h/o PE in 2013 and on anticoagulation for one year. Her D dimer came back mildly elevated, CT angio chest was negative for PE. Influenza negative.    Assessment/Plan:  Acute Respiratory failure  -Do not believe she has COPD (says her pulmonologist at [facility] has done PFTs twice). No wheezing on exam. Requesting records from [facility] (4/9). - Suspect her vocal cord dysfunction is contributing to her respiratory issues.  On top of her baseline OSA. -She may also have some low-grade aspiration related to her vocal cord dysfunction and possibly some reflux as well. -Start PPI. -Check flu PCR (negative).  -She was started on IV steroids (will start titrating 4/8), bronchodilators and nasal oxygen to keep sats >90% .  -Will  DC brovana and pulmicort as she has no h/o COPD and no wheezing on exam today. -Encourage mobilization with PT/OT. -Will need home oxygen. -ECHO pending.   OSA -qHs CPAP.   Diabetes Mellitus; - uncontrolled cbg's with solumedrol.  - resistant scale SSI - small dose of lantus and TIDAC novolog.  - hgba1c is 7.2 -Will start improving as we continue to titrate steroids.  Elevated D dimer: - CT chest negative for PE.   Morbid Obesity  Tachycardia: Related to her coughing spells and her increased work of breathing.  Sinus tachycardia on the monitor.   Spasmodic dysphonia: - scheduled for surgery later this month at [facility].       Code Status: full code Family Communication: none at bedside Disposition Plan: Home in 1-2 days.   Consultants:  none  Procedures:  none  Antibiotics:  levaquin 4/6  HPI/Subjective: Coughing, reports using cough medication with codeine.   Objective: Filed Vitals:   12/25/13 1341  BP: 132/83  Pulse: 102  Temp:   Resp: 22    Intake/Output Summary (Last 24 hours) at 12/25/13 1632 Last data filed at 12/25/13 0758  Gross per 24 hour  Intake    710 ml  Output      0 ml  Net    710 ml   Filed Weights   12/21/13 2251 12/23/13 1505  Weight: 105.7 kg (233 lb 0.4 oz) 108.8 kg (239 lb 13.8 oz)    Exam:   General:  Alert afebrile  Cardiovascular: s1s2 tachycardic  Respiratory: clear,  no wheezing  Abdomen: soft NT ND BS+  Musculoskeletal: no pedal edema.   Data Reviewed: Basic Metabolic Panel:  Recent Labs Lab 12/21/13 1700 12/22/13 0342 12/24/13 0613 12/25/13 0535  NA 141 140 139 142  K 4.1 4.2 4.4 4.1  CL 102 101 101 104  CO2 28 26 27 29   GLUCOSE 103* 284* 201* 122*  BUN 7 9 18 18   CREATININE 0.70 0.60 0.54 0.72  CALCIUM 9.0 9.2 8.8 8.6  MG  --  2.0  --   --   PHOS  --  2.5  --   --    Liver Function Tests:  Recent Labs Lab 12/22/13 0342  AST 16  ALT 14  ALKPHOS 80  BILITOT <0.2*  PROT 7.0   ALBUMIN 3.4*   No results found for this basename: LIPASE, AMYLASE,  in the last 168 hours No results found for this basename: AMMONIA,  in the last 168 hours CBC:  Recent Labs Lab 12/21/13 1700 12/22/13 0342 12/24/13 0613 12/25/13 0535  WBC 7.8 5.9 12.1* 9.2  NEUTROABS 5.0  --   --   --   HGB 12.8 12.9 12.6 12.5  HCT 40.9 40.7 39.2 39.2  MCV 89.3 88.3 88.1 86.9  PLT 282 282 271 247   Cardiac Enzymes:  Recent Labs Lab 12/21/13 2158 12/22/13 0342 12/22/13 1017  TROPONINI <0.30 <0.30 <0.30   BNP (last 3 results)  Recent Labs  12/21/13 1700  PROBNP 82.3   CBG:  Recent Labs Lab 12/24/13 0809 12/24/13 1142 12/24/13 1706 12/25/13 0737 12/25/13 1217  GLUCAP 156* 252* 196* 111* 162*    Recent Results (from the past 240 hour(s))  MRSA PCR SCREENING     Status: None   Collection Time    12/21/13 11:06 PM      Result Value Ref Range Status   MRSA by PCR NEGATIVE  NEGATIVE Final   Comment:            The GeneXpert MRSA Assay (FDA     approved for NASAL specimens     only), is one component of a     comprehensive MRSA colonization     surveillance program. It is not     intended to diagnose MRSA     infection nor to guide or     monitor treatment for     MRSA infections.     Studies: No results found.  Scheduled Meds: . acetaminophen  650 mg Oral Once  . clonazePAM  0.5 mg Oral QHS  . cyclobenzaprine  10 mg Oral QHS  . docusate sodium  100 mg Oral BID  . DULoxetine  60 mg Oral Daily  . enoxaparin (LOVENOX) injection  40 mg Subcutaneous QHS  . gabapentin  800 mg Oral BID  . insulin aspart  0-20 Units Subcutaneous TID WC  . insulin aspart  0-5 Units Subcutaneous QHS  . insulin aspart  4 Units Subcutaneous TID WC  . insulin glargine  5 Units Subcutaneous QHS  . ipratropium-albuterol  3 mL Nebulization TID  . levofloxacin  750 mg Oral Q24H  . levothyroxine  25 mcg Oral QAC breakfast  . pantoprazole  40 mg Oral Daily  . predniSONE  60 mg Oral Q  breakfast  . primidone  50 mg Oral QHS  . sodium chloride  3 mL Intravenous Q12H  . sodium chloride  3 mL Intravenous Q12H   Continuous Infusions:   Active Problems:   DIABETES MELLITUS, TYPE II  Hypoxemia   COPD exacerbation   History of pulmonary embolus (PE)   OSA on CPAP    Time spent: 35 minutes    [place] Pager [phone] If 7PM-7AM, please contact night-coverage at [email or web address], password [facility] 12/25/2013, 4:32 PM  LOS: 4 days

## 2013-12-26 DIAGNOSIS — I517 Cardiomegaly: Secondary | ICD-10-CM

## 2013-12-26 DIAGNOSIS — J9620 Acute and chronic respiratory failure, unspecified whether with hypoxia or hypercapnia: Principal | ICD-10-CM

## 2013-12-26 LAB — GLUCOSE, CAPILLARY
Glucose-Capillary: 89 mg/dL (ref 70–99)
Glucose-Capillary: 203 mg/dL — ABNORMAL HIGH (ref 70–99)
Glucose-Capillary: 198 mg/dL — ABNORMAL HIGH (ref 70–99)

## 2013-12-26 LAB — TROPONIN I
Troponin I: <0.3 ng/mL (ref <0.30)
Troponin I: <0.3 ng/mL (ref <0.30)

## 2013-12-26 MED ORDER — ALBUTEROL SULFATE HFA 108 (90 BASE) MCG/ACT IN AERS: 2.0000 | INHALATION_SPRAY | Freq: Four times a day (QID) | RESPIRATORY_TRACT | Status: DC | PRN

## 2013-12-26 MED ORDER — PREDNISONE (PAK) 10 MG PO TABS: ORAL_TABLET | Freq: Every day | ORAL | Status: DC

## 2013-12-26 MED ORDER — IPRATROPIUM-ALBUTEROL 0.5-2.5 (3) MG/3ML IN SOLN
3.0000 mL | Freq: Two times a day (BID) | RESPIRATORY_TRACT | Status: DC
  Administered 2013-12-26 (×2): 3 mL via RESPIRATORY_TRACT
  Filled 2013-12-26: qty 3

## 2013-12-26 MED ORDER — CLONAZEPAM 0.5 MG PO TABS: 0.5000 mg | ORAL_TABLET | Freq: Every day | ORAL | Status: DC

## 2013-12-26 MED ORDER — HYDROCODONE-ACETAMINOPHEN 5-325 MG PO TABS: 1.0000 | ORAL_TABLET | Freq: Four times a day (QID) | ORAL | Status: DC | PRN

## 2013-12-26 MED ORDER — IPRATROPIUM-ALBUTEROL 0.5-2.5 (3) MG/3ML IN SOLN
3.0000 mL | Freq: Four times a day (QID) | RESPIRATORY_TRACT | Status: DC | PRN
  Filled 2013-12-26: qty 3

## 2013-12-26 MED ORDER — GUAIFENESIN-CODEINE 100-10 MG/5ML PO SOLN: 5.0000 mL | ORAL | Status: DC | PRN

## 2013-12-26 MED ORDER — ONDANSETRON 8 MG PO TBDP: 8.0000 mg | ORAL_TABLET | Freq: Three times a day (TID) | ORAL | Status: DC | PRN

## 2013-12-26 MED ORDER — PANTOPRAZOLE SODIUM 40 MG PO TBEC: 40.0000 mg | DELAYED_RELEASE_TABLET | Freq: Every day | ORAL | Status: DC

## 2013-12-26 MED ORDER — LEVALBUTEROL HCL 0.63 MG/3ML IN NEBU: 0.6300 mg | INHALATION_SOLUTION | Freq: Three times a day (TID) | RESPIRATORY_TRACT | Status: DC | PRN

## 2013-12-26 NOTE — Progress Notes (Signed)
Pt discharged to home. Left unit in wheelchair pushed by this rn. Security drove patient to lacate her car in parking lot. Left in good condition. Dc instructions and prescriptions given. No concerns voiced. Vwilliams,rn.

## 2013-12-26 NOTE — Discharge Summary (Signed)
Physician Discharge Summary  Hayley [name] WFU:[id number] DOB: [birth year] DOA: 12/21/2013  PCP: Tommy Medal, MD  Admit date: 12/21/2013 Discharge date: 12/26/2013  Time spent: 45 minutes  Recommendations for Outpatient Follow-up:  -Will be discharged home today. -Advised to follow up with PCP next week. -Will need oxygen for now; since she is traveling by plane, will need her PCP to write a note of oxygen necessity for her flight.   Discharge Diagnoses:  Active Problems:   DIABETES MELLITUS, TYPE II   Hypoxemia   COPD exacerbation   History of pulmonary embolus (PE)   OSA on CPAP   Discharge Condition: Stable and improved  Filed Weights   12/21/13 2251 12/23/13 1505  Weight: 105.7 kg (233 lb 0.4 oz) 108.8 kg (239 lb 13.8 oz)    History of present illness:  Hayley [name] is a 68 y.o. female  has a past medical history of Hyperthyroidism; Toxic goiter; Reflux; Colon polyps; Osteoarthritis; Migraines; Disorder of vocal cords; Obesity, Class III, BMI 40-49.9 (morbid obesity) (04/27/2010); Pelvic pain; Anemia; Clotting disorder; and Neuromuscular disorder.  Presented with  Patient states ever since christmas she have had some shortness of breath and cough. This Thursday 4 days  Ago she had a sudden onset of shortness of breath and cough productive of green/yellow mucus. She tried to control this with Nebulizers. 3 days ago she was started on Avelox by her ENT office but she continues to keep coughing and feels very worn out. She have had some chest pain worse with coughing. States she have had some sore throat as well. She had some low grade fevers. This morning patietn states she felt like her throat was closing up and she was lightheaded she gave her self a nebulizer treatment but the effects did not last too long. She presented to ER and was found to be hypoxic down to 80%. She requiring 6L to keep O2 >92%  She uses CPAP with bleed O2 at home every night. She was never diagnosed with  COPD was her PCP has started her on [facility] for "wheezing". Hospitalist was called for admission for COPD exacerbation.  Patient states she had a PE in 2013 and has been on anticoagulation for almost a 1 year. Denies any pleuritic chest pain.    Hospital Course:   Acute on Chronic Hypoxemic Respiratory failure  -Do not believe she has COPD (says her pulmonologist at [facility] has done PFTs twice). No wheezing on exam. Requesting records from [facility] (4/9).  - Suspect her vocal cord dysfunction is contributing to her respiratory issues. On top of her baseline OSA.  -She may also have some low-grade aspiration related to her vocal cord dysfunction and possibly some reflux as well.  -Start PPI.  -Check flu PCR (negative).  -She was started on IV steroids (will start titrating 4/8), bronchodilators and nasal oxygen to keep sats >90% .  -Will DC brovana and pulmicort as she has no h/o COPD and no wheezing on exam.  -Encourage mobilization with PT/OT.  -Will need home oxygen.  -ECHO: Study Conclusions Left ventricle: The cavity size was normal. Wall thickness was increased in a pattern of mild LVH. Systolic function was normal. The estimated ejection fraction was in the range of 55% to 60%. Regional wall motion abnormalities cannot be excluded. Doppler parameters are consistent with abnormal left ventricular relaxation (grade 1 diastolic dysfunction).   OSA  -qHs CPAP.   Diabetes Mellitus;  - uncontrolled cbg's with solumedrol.  - resistant scale  SSI  - small dose of lantus and TIDAC novolog.  - hgba1c is 7.2  -Will start improving as we continue to titrate steroids.  -No need for insulin on DC.  Elevated D dimer:  - CT chest negative for PE.   Morbid Obesity   Tachycardia: Related to her coughing spells and her increased work of breathing.  Sinus tachycardia on the monitor.  -Resolved by DC.  Spasmodic dysphonia  - scheduled for surgery later this month at [facility].       Procedures:  None   Consultations:  None  Discharge Instructions  Discharge Orders   Future Appointments Provider Department Dept Phone   01/27/2014 9:30 AM Kathee Delton, MD [facility] [phone]   Future Orders Complete By Expires   Discontinue IV  As directed    Increase activity slowly  As directed        Medication List    STOP taking these medications       BOTOX IJ     DULERA 100-5 MCG/ACT Aero  Generic drug:  mometasone-formoterol      TAKE these medications       clonazePAM 0.5 MG tablet  Commonly known as:  KLONOPIN  Take 1 tablet (0.5 mg total) by mouth at bedtime. For agitation or anxiety     cyclobenzaprine 10 MG tablet  Commonly known as:  FLEXERIL  Take 10 mg by mouth at bedtime.     diclofenac 1.3 % Ptch  Commonly known as:  FLECTOR  Place 1 patch onto the skin daily as needed. Apply to back     DULoxetine 60 MG capsule  Commonly known as:  CYMBALTA  Take 60 mg by mouth daily.     EPIPEN 0.3 mg/0.3 mL Devi  Generic drug:  EPINEPHrine  Inject 0.3 mg into the muscle once.     gabapentin 800 MG tablet  Commonly known as:  NEURONTIN  Take 800 mg by mouth 2 (two) times daily.     guaiFENesin-codeine 100-10 MG/5ML syrup  Take 5 mLs by mouth every 4 (four) hours as needed for cough.     HYDROcodone-acetaminophen 5-325 MG per tablet  Commonly known as:  NORCO/VICODIN  Take 1 tablet by mouth every 6 (six) hours as needed for moderate pain (pain).     levothyroxine 25 MCG tablet  Commonly known as:  SYNTHROID, LEVOTHROID  Take 25 mcg by mouth daily.     ondansetron 8 MG disintegrating tablet  Commonly known as:  ZOFRAN-ODT  Take 1 tablet (8 mg total) by mouth every 8 (eight) hours as needed for nausea.     pantoprazole 40 MG tablet  Commonly known as:  PROTONIX  Take 1 tablet (40 mg total) by mouth daily.     predniSONE 10 MG tablet  Commonly known as:  STERAPRED UNI-PAK  Take by mouth daily. Take 6 tablets  today and then decrease by one tablet daily until none are left     primidone 50 MG tablet  Commonly known as:  MYSOLINE  Take 50 mg by mouth at bedtime.     rizatriptan 10 MG tablet  Commonly known as:  MAXALT  Take 10 mg by mouth as needed for migraine (migraine). May repeat in 2 hours if needed     sitaGLIPtin 100 MG tablet  Commonly known as:  JANUVIA  Take 100 mg by mouth daily.       Allergies  Allergen Reactions  . Nutritional Supplements Anaphylaxis  Pt does not remember this happening  . Bee Venom     unknown  . Etodolac Nausea And Vomiting  . Morphine Other (See Comments)    Hallucinations  . Propoxyphene N-Acetaminophen Nausea And Vomiting  . Penicillins Rash       Follow-up Information   Follow up with PANG,RICHARD, MD. Schedule an appointment as soon as possible for a visit in 3 days.   Specialty:  Internal Medicine   Contact information:   [address], [place] [address] [phone]        The results of significant diagnostics from this hospitalization (including imaging, microbiology, ancillary and laboratory) are listed below for reference.    Significant Diagnostic Studies: Dg Chest 2 View  12/21/2013   CLINICAL DATA:  Cough.  Wheezing.  Short of breath.  Weakness.  EXAM: CHEST  2 VIEW  COMPARISON:  DG CHEST 2V dated 11/19/2013  FINDINGS: Cardiopericardial silhouette within normal limits. Mediastinal contours normal. Trachea midline. No airspace disease or effusion. Suboptimal penetration due to obese body habitus. Basilar atelectasis. Monitoring leads project over the chest.  IMPRESSION: No active cardiopulmonary disease.   Electronically Signed   By: Dereck Ligas M.D.   On: 12/21/2013 16:37   Ct Angio Chest Pe W/cm &/or Wo Cm  12/22/2013   CLINICAL DATA:  Shortness of breath.  Rule out pulmonary embolism.  EXAM: CT ANGIOGRAPHY CHEST WITH CONTRAST  TECHNIQUE: Multidetector CT imaging of the chest was performed using the  standard protocol during bolus administration of intravenous contrast. Multiplanar CT image reconstructions and MIPs were obtained to evaluate the vascular anatomy.  CONTRAST:  131mL OMNIPAQUE IOHEXOL 350 MG/ML SOLN  COMPARISON:  12/06/2011  FINDINGS: Lungs are adequately inflated and demonstrate linear atelectasis over the medial posterior right lower lobe. There is possible minimal debris within the proximal right lower lobe bronchus. There is minimal linear scarring versus atelectasis over the left lower lobe. There is no evidence of effusion. Note that the extreme lung apices are excluded from the field-of-view.  Heart is normal size. There is calcified plaque over the left and percent descending coronary artery. There is no evidence of pulmonary embolism. Remaining mediastinal structures are within normal.  Images through the upper abdomen are unremarkable. There are degenerative changes of the spine.  Review of the MIP images confirms the above findings.  IMPRESSION: No evidence of pulmonary embolism. No acute cardiopulmonary disease.  Possible minimal debris within the proximal right lower lobe bronchus. Linear consolidation over the posterior medial right lower lobe most typical of atelectasis, although cannot completely exclude infection secondary to aspiration. Minimal scarring versus linear atelectasis over the left lower lobe.  Mild atherosclerotic coronary artery disease.   Electronically Signed   By: Marin Olp M.D.   On: 12/22/2013 19:01    Microbiology: Recent Results (from the past 240 hour(s))  MRSA PCR SCREENING     Status: None   Collection Time    12/21/13 11:06 PM      Result Value Ref Range Status   MRSA by PCR NEGATIVE  NEGATIVE Final   Comment:            The GeneXpert MRSA Assay (FDA     approved for NASAL specimens     only), is one component of a     comprehensive MRSA colonization     surveillance program. It is not     intended to diagnose MRSA     infection nor to  guide or  monitor treatment for     MRSA infections.     Labs: Basic Metabolic Panel:  Recent Labs Lab 12/21/13 1700 12/22/13 0342 12/24/13 0613 12/25/13 0535  NA 141 140 139 142  K 4.1 4.2 4.4 4.1  CL 102 101 101 104  CO2 28 26 27 29   GLUCOSE 103* 284* 201* 122*  BUN 7 9 18 18   CREATININE 0.70 0.60 0.54 0.72  CALCIUM 9.0 9.2 8.8 8.6  MG  --  2.0  --   --   PHOS  --  2.5  --   --    Liver Function Tests:  Recent Labs Lab 12/22/13 0342  AST 16  ALT 14  ALKPHOS 80  BILITOT <0.2*  PROT 7.0  ALBUMIN 3.4*   No results found for this basename: LIPASE, AMYLASE,  in the last 168 hours No results found for this basename: AMMONIA,  in the last 168 hours CBC:  Recent Labs Lab 12/21/13 1700 12/22/13 0342 12/24/13 0613 12/25/13 0535  WBC 7.8 5.9 12.1* 9.2  NEUTROABS 5.0  --   --   --   HGB 12.8 12.9 12.6 12.5  HCT 40.9 40.7 39.2 39.2  MCV 89.3 88.3 88.1 86.9  PLT 282 282 271 247   Cardiac Enzymes:  Recent Labs Lab 12/22/13 0342 12/22/13 1017 12/25/13 2157 12/26/13 0626 12/26/13 0902  TROPONINI <0.30 <0.30 <0.30 <0.30 <0.30   BNP: BNP (last 3 results)  Recent Labs  12/21/13 1700  PROBNP 82.3   CBG:  Recent Labs Lab 12/25/13 1616 12/25/13 2257 12/26/13 0718 12/26/13 1121 12/26/13 1714  GLUCAP 254* 109* 89 203* 198*       Signed:  Erline Hau  [facility] Pager: [phone] 12/26/2013, 5:23 PM

## 2013-12-26 NOTE — Progress Notes (Signed)
Echocardiogram 2D Echocardiogram has been performed.  Hayley [name] 12/26/2013, 9:55 AM

## 2013-12-26 NOTE — Progress Notes (Signed)
At 2040 complaint of chest pain, non radiating.  During this time, Tele monitor with noted 3 separate runs of vtach(10 runs or so) VSS EKG nsr On call notified, troponin level x 3 ordered(neg x 1)  Patient resting quiet at present, no further episodes noted

## 2013-12-28 DIAGNOSIS — Z85828 Personal history of other malignant neoplasm of skin: Secondary | ICD-10-CM | POA: Diagnosis not present

## 2013-12-28 DIAGNOSIS — L821 Other seborrheic keratosis: Secondary | ICD-10-CM | POA: Diagnosis not present

## 2013-12-28 DIAGNOSIS — Z4802 Encounter for removal of sutures: Secondary | ICD-10-CM | POA: Diagnosis not present

## 2013-12-28 DIAGNOSIS — Z9889 Other specified postprocedural states: Secondary | ICD-10-CM | POA: Diagnosis not present

## 2013-12-28 DIAGNOSIS — D692 Other nonthrombocytopenic purpura: Secondary | ICD-10-CM | POA: Diagnosis not present

## 2013-12-29 DIAGNOSIS — R0609 Other forms of dyspnea: Secondary | ICD-10-CM | POA: Diagnosis not present

## 2013-12-29 DIAGNOSIS — J383 Other diseases of vocal cords: Secondary | ICD-10-CM | POA: Diagnosis not present

## 2013-12-29 DIAGNOSIS — R0989 Other specified symptoms and signs involving the circulatory and respiratory systems: Secondary | ICD-10-CM | POA: Diagnosis not present

## 2014-01-01 DIAGNOSIS — Z79899 Other long term (current) drug therapy: Secondary | ICD-10-CM | POA: Diagnosis not present

## 2014-01-01 DIAGNOSIS — M545 Low back pain, unspecified: Secondary | ICD-10-CM | POA: Diagnosis not present

## 2014-01-01 DIAGNOSIS — Z880 Allergy status to penicillin: Secondary | ICD-10-CM | POA: Diagnosis not present

## 2014-01-01 DIAGNOSIS — M25559 Pain in unspecified hip: Secondary | ICD-10-CM | POA: Diagnosis not present

## 2014-01-01 DIAGNOSIS — Z91038 Other insect allergy status: Secondary | ICD-10-CM | POA: Diagnosis not present

## 2014-01-01 DIAGNOSIS — M7989 Other specified soft tissue disorders: Secondary | ICD-10-CM | POA: Diagnosis not present

## 2014-01-01 DIAGNOSIS — Z885 Allergy status to narcotic agent status: Secondary | ICD-10-CM | POA: Diagnosis not present

## 2014-01-01 DIAGNOSIS — E079 Disorder of thyroid, unspecified: Secondary | ICD-10-CM | POA: Diagnosis not present

## 2014-01-01 DIAGNOSIS — G894 Chronic pain syndrome: Secondary | ICD-10-CM | POA: Diagnosis not present

## 2014-01-01 DIAGNOSIS — Z96659 Presence of unspecified artificial knee joint: Secondary | ICD-10-CM | POA: Diagnosis not present

## 2014-01-01 DIAGNOSIS — Z792 Long term (current) use of antibiotics: Secondary | ICD-10-CM | POA: Diagnosis not present

## 2014-01-01 DIAGNOSIS — M1710 Unilateral primary osteoarthritis, unspecified knee: Secondary | ICD-10-CM | POA: Diagnosis not present

## 2014-01-01 DIAGNOSIS — IMO0001 Reserved for inherently not codable concepts without codable children: Secondary | ICD-10-CM | POA: Diagnosis not present

## 2014-01-01 DIAGNOSIS — M48061 Spinal stenosis, lumbar region without neurogenic claudication: Secondary | ICD-10-CM | POA: Diagnosis not present

## 2014-01-01 DIAGNOSIS — M533 Sacrococcygeal disorders, not elsewhere classified: Secondary | ICD-10-CM | POA: Diagnosis not present

## 2014-01-01 DIAGNOSIS — G4733 Obstructive sleep apnea (adult) (pediatric): Secondary | ICD-10-CM | POA: Diagnosis not present

## 2014-01-01 DIAGNOSIS — Z87442 Personal history of urinary calculi: Secondary | ICD-10-CM | POA: Diagnosis not present

## 2014-01-01 DIAGNOSIS — IMO0002 Reserved for concepts with insufficient information to code with codable children: Secondary | ICD-10-CM | POA: Diagnosis not present

## 2014-01-05 ENCOUNTER — Institutional Professional Consult (permissible substitution): Payer: Medicare Other | Admitting: Pulmonary Disease

## 2014-01-11 DIAGNOSIS — J387 Other diseases of larynx: Secondary | ICD-10-CM | POA: Diagnosis not present

## 2014-01-12 DIAGNOSIS — E039 Hypothyroidism, unspecified: Secondary | ICD-10-CM | POA: Diagnosis present

## 2014-01-12 DIAGNOSIS — G4733 Obstructive sleep apnea (adult) (pediatric): Secondary | ICD-10-CM | POA: Diagnosis not present

## 2014-01-12 DIAGNOSIS — R000 Tachycardia, unspecified: Secondary | ICD-10-CM | POA: Diagnosis not present

## 2014-01-12 DIAGNOSIS — E038 Other specified hypothyroidism: Secondary | ICD-10-CM | POA: Diagnosis not present

## 2014-01-12 DIAGNOSIS — R0902 Hypoxemia: Secondary | ICD-10-CM | POA: Diagnosis not present

## 2014-01-12 DIAGNOSIS — I700 Atherosclerosis of aorta: Secondary | ICD-10-CM | POA: Diagnosis not present

## 2014-01-12 DIAGNOSIS — T50904A Poisoning by unspecified drugs, medicaments and biological substances, undetermined, initial encounter: Secondary | ICD-10-CM | POA: Diagnosis not present

## 2014-01-12 DIAGNOSIS — R0609 Other forms of dyspnea: Secondary | ICD-10-CM | POA: Diagnosis not present

## 2014-01-12 DIAGNOSIS — R0989 Other specified symptoms and signs involving the circulatory and respiratory systems: Secondary | ICD-10-CM | POA: Diagnosis not present

## 2014-01-12 DIAGNOSIS — Z01818 Encounter for other preprocedural examination: Secondary | ICD-10-CM | POA: Diagnosis not present

## 2014-01-12 DIAGNOSIS — J45909 Unspecified asthma, uncomplicated: Secondary | ICD-10-CM | POA: Diagnosis not present

## 2014-01-12 DIAGNOSIS — J9819 Other pulmonary collapse: Secondary | ICD-10-CM | POA: Diagnosis not present

## 2014-01-12 DIAGNOSIS — R197 Diarrhea, unspecified: Secondary | ICD-10-CM | POA: Diagnosis not present

## 2014-01-12 DIAGNOSIS — J387 Other diseases of larynx: Secondary | ICD-10-CM | POA: Diagnosis not present

## 2014-01-12 DIAGNOSIS — N3941 Urge incontinence: Secondary | ICD-10-CM | POA: Diagnosis present

## 2014-01-12 DIAGNOSIS — G4730 Sleep apnea, unspecified: Secondary | ICD-10-CM | POA: Diagnosis present

## 2014-01-12 DIAGNOSIS — R490 Dysphonia: Secondary | ICD-10-CM | POA: Diagnosis present

## 2014-01-12 DIAGNOSIS — E119 Type 2 diabetes mellitus without complications: Secondary | ICD-10-CM | POA: Diagnosis present

## 2014-01-12 DIAGNOSIS — Z86718 Personal history of other venous thrombosis and embolism: Secondary | ICD-10-CM | POA: Diagnosis not present

## 2014-01-12 DIAGNOSIS — N390 Urinary tract infection, site not specified: Secondary | ICD-10-CM | POA: Diagnosis not present

## 2014-01-18 ENCOUNTER — Encounter: Payer: Self-pay | Admitting: Pulmonary Disease

## 2014-01-18 DIAGNOSIS — G4733 Obstructive sleep apnea (adult) (pediatric): Secondary | ICD-10-CM | POA: Diagnosis not present

## 2014-01-18 DIAGNOSIS — N390 Urinary tract infection, site not specified: Secondary | ICD-10-CM | POA: Diagnosis not present

## 2014-01-18 DIAGNOSIS — B354 Tinea corporis: Secondary | ICD-10-CM | POA: Diagnosis not present

## 2014-01-18 DIAGNOSIS — R0902 Hypoxemia: Secondary | ICD-10-CM | POA: Diagnosis not present

## 2014-01-18 DIAGNOSIS — J387 Other diseases of larynx: Secondary | ICD-10-CM | POA: Diagnosis not present

## 2014-01-18 DIAGNOSIS — N3000 Acute cystitis without hematuria: Secondary | ICD-10-CM | POA: Diagnosis not present

## 2014-01-18 DIAGNOSIS — R197 Diarrhea, unspecified: Secondary | ICD-10-CM | POA: Diagnosis not present

## 2014-01-27 ENCOUNTER — Institutional Professional Consult (permissible substitution): Payer: Medicare Other | Admitting: Pulmonary Disease

## 2014-01-27 DIAGNOSIS — G43709 Chronic migraine without aura, not intractable, without status migrainosus: Secondary | ICD-10-CM | POA: Diagnosis not present

## 2014-01-28 DIAGNOSIS — R0609 Other forms of dyspnea: Secondary | ICD-10-CM | POA: Diagnosis not present

## 2014-01-28 DIAGNOSIS — R0989 Other specified symptoms and signs involving the circulatory and respiratory systems: Secondary | ICD-10-CM | POA: Diagnosis not present

## 2014-02-02 DIAGNOSIS — Z96659 Presence of unspecified artificial knee joint: Secondary | ICD-10-CM | POA: Diagnosis not present

## 2014-02-02 DIAGNOSIS — T84029A Dislocation of unspecified internal joint prosthesis, initial encounter: Secondary | ICD-10-CM | POA: Diagnosis not present

## 2014-02-02 DIAGNOSIS — M1710 Unilateral primary osteoarthritis, unspecified knee: Secondary | ICD-10-CM | POA: Diagnosis not present

## 2014-02-08 ENCOUNTER — Other Ambulatory Visit: Payer: Self-pay | Admitting: Orthopedic Surgery

## 2014-02-11 DIAGNOSIS — J45909 Unspecified asthma, uncomplicated: Secondary | ICD-10-CM | POA: Diagnosis not present

## 2014-02-11 DIAGNOSIS — J309 Allergic rhinitis, unspecified: Secondary | ICD-10-CM | POA: Diagnosis not present

## 2014-02-11 DIAGNOSIS — M1710 Unilateral primary osteoarthritis, unspecified knee: Secondary | ICD-10-CM | POA: Diagnosis not present

## 2014-02-15 ENCOUNTER — Encounter (HOSPITAL_COMMUNITY): Payer: Self-pay | Admitting: Pharmacy Technician

## 2014-02-17 ENCOUNTER — Encounter (HOSPITAL_COMMUNITY): Payer: Self-pay

## 2014-02-17 ENCOUNTER — Encounter (HOSPITAL_COMMUNITY)
Admission: RE | Admit: 2014-02-17 | Discharge: 2014-02-17 | Disposition: A | Discharge status: Home / Self Care | Payer: Medicare Other | Source: Ambulatory Visit | Attending: Orthopedic Surgery | Admitting: Orthopedic Surgery

## 2014-02-17 DIAGNOSIS — D689 Coagulation defect, unspecified: Secondary | ICD-10-CM

## 2014-02-17 DIAGNOSIS — Z01812 Encounter for preprocedural laboratory examination: Secondary | ICD-10-CM | POA: Insufficient documentation

## 2014-02-17 HISTORY — PX: OTHER SURGICAL HISTORY: SHX169

## 2014-02-17 HISTORY — DX: Type 2 diabetes mellitus without complications: E11.9

## 2014-02-17 HISTORY — DX: Coagulation defect, unspecified: D68.9

## 2014-02-17 HISTORY — DX: Other pulmonary embolism without acute cor pulmonale: I26.99

## 2014-02-17 LAB — CBC
HCT: 41 % (ref 36.0–46.0)
Hemoglobin: 13 g/dL (ref 12.0–15.0)
MCH: 27.8 pg (ref 26.0–34.0)
MCHC: 31.7 g/dL (ref 30.0–36.0)
MCV: 87.8 fL (ref 78.0–100.0)
Platelets: 279 10*3/uL (ref 150–400)
RBC: 4.67 MIL/uL (ref 3.87–5.11)
RDW: 14.3 % (ref 11.5–15.5)
WBC: 7.6 10*3/uL (ref 4.0–10.5)

## 2014-02-17 LAB — PROTIME-INR
INR: 1.01 (ref 0.00–1.49)
Prothrombin Time: 13.1 seconds (ref 11.6–15.2)

## 2014-02-17 LAB — SURGICAL PCR SCREEN
MRSA, PCR: NEGATIVE
Staphylococcus aureus: NEGATIVE

## 2014-02-17 LAB — COMPREHENSIVE METABOLIC PANEL
ALT: 14 U/L (ref 0–35)
AST: 22 U/L (ref 0–37)
Albumin: 3.5 g/dL (ref 3.5–5.2)
Alkaline Phosphatase: 78 U/L (ref 39–117)
BUN: 7 mg/dL (ref 6–23)
CO2: 29 mEq/L (ref 19–32)
Calcium: 9.1 mg/dL (ref 8.4–10.5)
Chloride: 103 mEq/L (ref 96–112)
Creatinine, Ser: 0.57 mg/dL (ref 0.50–1.10)
GFR calc Af Amer: >90 mL/min (ref >90)
GFR calc non Af Amer: >90 mL/min (ref >90)
Glucose, Bld: 103 mg/dL — ABNORMAL HIGH (ref 70–99)
Potassium: 4.4 mEq/L (ref 3.7–5.3)
Sodium: 141 mEq/L (ref 137–147)
Total Bilirubin: 0.3 mg/dL (ref 0.3–1.2)
Total Protein: 7.2 g/dL (ref 6.0–8.3)

## 2014-02-17 LAB — URINALYSIS, ROUTINE W REFLEX MICROSCOPIC
Bilirubin Urine: NEGATIVE
Glucose, UA: NEGATIVE mg/dL
Hgb urine dipstick: NEGATIVE
Ketones, ur: NEGATIVE mg/dL
Leukocytes, UA: NEGATIVE
Nitrite: NEGATIVE
Protein, ur: NEGATIVE mg/dL
Specific Gravity, Urine: 1.01 (ref 1.005–1.030)
Urobilinogen, UA: 0.2 mg/dL (ref 0.0–1.0)
pH: 6 (ref 5.0–8.0)

## 2014-02-17 LAB — APTT: aPTT: 35 seconds (ref 24–37)

## 2014-02-17 NOTE — Anesthesia Preprocedure Evaluation (Addendum)
Anesthesia Evaluation  Patient identified by MRN, date of birth, ID band Patient confused  General Assessment Comment:Poor historian at present  Reviewed: Allergy & Precautions, H&P , NPO status , Patient's Chart, lab work & pertinent test results, reviewed documented beta blocker date and time   History of Anesthesia Complications Negative for: history of anesthetic complications  Airway Mallampati: III TM Distance: >3 FB Neck ROM: Full  Mouth opening: Limited Mouth Opening  Dental  (+) Teeth Intact, Dental Advisory Given   Pulmonary shortness of breath (On home O2 at night.), with exertion and Long-Term Oxygen Therapy, sleep apnea and Continuous Positive Airway Pressure Ventilation , former smoker,  breath sounds clear to auscultation  Pulmonary exam normal       Cardiovascular Exercise Tolerance: Poor hypertension, Pt. on medications and Pt. on home beta blockers Rhythm:Regular Rate:Normal     Neuro/Psych  Headaches, PSYCHIATRIC DISORDERS Anxiety Depression Patient has history of Spastic Dysphonia of vocal cords which has required long term botox therapy as well as recent vocal cord ("nerve") surgery for reattachment of suspected recurrent laryngeal nerve in [place] 6 weeks ago.  Neuromuscular disease    GI/Hepatic Neg liver ROS, GERD-  Medicated,  Endo/Other  diabetes, Type 2, Oral Hypoglycemic AgentsHyperthyroidism Morbid obesity  Renal/GU Renal disease  negative genitourinary   Musculoskeletal negative musculoskeletal ROS (+)   Abdominal (+) + obese,  Abdomen: tender.    Peds  Hematology negative hematology ROS (+) anemia ,   Anesthesia Other Findings Patient very hoarse with minimal projection of voice.  Reproductive/Obstetrics                          Anesthesia Physical Anesthesia Plan  ASA: III  Anesthesia Plan: Spinal   Post-op Pain Management:    Induction:  Intravenous  Airway Management Planned: Simple Face Mask  Additional Equipment:   Intra-op Plan:   Post-operative Plan:   Informed Consent: I have reviewed the patients History and Physical, chart, labs and discussed the procedure including the risks, benefits and alternatives for the proposed anesthesia with the patient or authorized representative who has indicated his/her understanding and acceptance.   Dental advisory given  Plan Discussed with: CRNA  Anesthesia Plan Comments: (Discussed at length patients recent surgery and aspects related to persistent vocal cord dysfunction. Patient has not been seen in follow-up (physician in [place]) since her surgical intervention. In light of patients medical history,  have discussed at length performing proposed surgery under spinal anesthesia with IV sedation. Patient is in agreement and will give physician in [place] a call to confirm any contraindications to proceeding with surgery for knee. Patient made aware that in the event SAB is ineffective, we will proceed with procedure under general anesthesia as needed. Will attempt to reach Ms. Lanes's surgeon in [place] before proceeding with surgery.)       Anesthesia Quick Evaluation

## 2014-02-17 NOTE — Pre-Procedure Instructions (Addendum)
02-17-14 EKG 3'15 report with chart, CXR, CT Chest 4'15 Echo 4'15 -Epic Dr. Winfred Leeds in for preop anesthesia consult -pt. Had vocal cord disorder with recent surgery 01-12-14([facility]). Requesting records to be placed with chart. Pt. Spoken with extensively and questions answered by Dr. Winfred Leeds. 02-17-14 1635 Dr. Winfred Leeds given copy of surgical procedure records of 01-12-14 vocal cord surgery as mentioned above. And records placed with chart.

## 2014-02-17 NOTE — Patient Instructions (Addendum)
[place]  02/17/2014   Your procedure is scheduled on:   02-24-2014  Enter through [facility] Entrance and follow signs to [place]. Arrive at     0730   AM.  Call this number if you have problems the morning of surgery: [phone]  Or Presurgical Testing [phone]([name]) For Living Will and/or Health Care Power Attorney Forms: please provide copy for your medical record,may bring AM of surgery(Forms should be already notarized -we do not provide this service).(Yes, 02-17-14 will bring HCPOA/ Living Will forms AM of 02-24-14).   Cpap use: Bring mask and tubing only.   Do not eat food:After Midnight.    Take these medicines the morning of surgery with A SIP OF WATER: Cetirizine. Cymbalta. Gabapentin.Hydrocodone. Levothyroxine. Pantoprazole. Bring Dulera/ Albuterol Inhalers-use as needed. Use Nebulizer as needed. Take no Diabetic meds AM of.   Do not wear jewelry, make-up or nail polish.  Do not wear lotions, powders, or perfumes. You may wear deodorant.  Do not shave 48 hours(2 days) prior to first CHG shower(legs and under arms).(Shaving face and neck okay.)  Do not bring valuables to the hospital.(Hospital is not responsible for lost valuables).  Contacts, dentures or removable bridgework, body piercing, hair pins may not be worn into surgery.  Leave suitcase in the car. After surgery it may be brought to your room.  For patients admitted to the hospital, checkout time is 11:00 AM the day of discharge.(Restricted visitors-Any Persons displaying flu-like symptoms or illness).    Patients discharged the day of surgery will not be allowed to drive home. Must have responsible person with you x 24 hours once discharged.  Name and phone number of your driver: Hayley [name] [phone] cell  Special Instructions: CHG(Chlorhedine 4%-"Hibiclens","Betasept","Aplicare") Shower Use Special Wash: see special instructions.(avoid face and genitals)   Please read over the  following fact sheets that you were given: MRSA Information, Blood Transfusion fact sheet, Incentive Spirometry Instruction.  Remember : Type/Screen "Blue armbands" - may not be removed once applied(would result in being retested AM of surgery, if removed).  Failure to follow these instructions may result in Cancellation of your surgery.   ___________________    [facility] - Preparing for Surgery Before surgery, you can play an important role.  Because skin is not sterile, your skin needs to be as free of germs as possible.  You can reduce the number of germs on your skin by washing with CHG (chlorahexidine gluconate) soap before surgery.  CHG is an antiseptic cleaner which kills germs and bonds with the skin to continue killing germs even after washing. Please DO NOT use if you have an allergy to CHG or antibacterial soaps.  If your skin becomes reddened/irritated stop using the CHG and inform your nurse when you arrive at Short Stay. Do not shave (including legs and underarms) for at least 48 hours prior to the first CHG shower.  You may shave your face/neck. Please follow these instructions carefully:  1.  Shower with CHG Soap the night before surgery and the  morning of Surgery.  2.  If you choose to wash your hair, wash your hair first as usual with your  normal  shampoo.  3.  After you shampoo, rinse your hair and body thoroughly to remove the  shampoo.                           4.  Use CHG as you  would any other liquid soap.  You can apply chg directly  to the skin and wash                       Gently with a scrungie or clean washcloth.  5.  Apply the CHG Soap to your body ONLY FROM THE NECK DOWN.   Do not use on face/ open                           Wound or open sores. Avoid contact with eyes, ears mouth and genitals (private parts).                       Wash face,  Genitals (private parts) with your normal soap.             6.  Wash thoroughly, paying special attention to the area  where your surgery  will be performed.  7.  Thoroughly rinse your body with warm water from the neck down.  8.  DO NOT shower/wash with your normal soap after using and rinsing off  the CHG Soap.                9.  Pat yourself dry with a clean towel.            10.  Wear clean pajamas.            11.  Place clean sheets on your bed the night of your first shower and do not  sleep with pets. Day of Surgery : Do not apply any lotions/deodorants the morning of surgery.  Please wear clean clothes to the hospital/surgery center.  FAILURE TO FOLLOW THESE INSTRUCTIONS MAY RESULT IN THE CANCELLATION OF YOUR SURGERY PATIENT SIGNATURE_________________________________  NURSE SIGNATURE__________________________________  ________________________________________________________________________   Hayley [name]  An incentive spirometer is a tool that can help keep your lungs clear and active. This tool measures how well you are filling your lungs with each breath. Taking long deep breaths may help reverse or decrease the chance of developing breathing (pulmonary) problems (especially infection) following:  A long period of time when you are unable to move or be active. BEFORE THE PROCEDURE   If the spirometer includes an indicator to show your best effort, your nurse or respiratory therapist will set it to a desired goal.  If possible, sit up straight or lean slightly forward. Try not to slouch.  Hold the incentive spirometer in an upright position. INSTRUCTIONS FOR USE  1. Sit on the edge of your bed if possible, or sit up as far as you can in bed or on a chair. 2. Hold the incentive spirometer in an upright position. 3. Breathe out normally. 4. Place the mouthpiece in your mouth and seal your lips tightly around it. 5. Breathe in slowly and as deeply as possible, raising the piston or the ball toward the top of the column. 6. Hold your breath for 3-5 seconds or for as long as possible.  Allow the piston or ball to fall to the bottom of the column. 7. Remove the mouthpiece from your mouth and breathe out normally. 8. Rest for a few seconds and repeat Steps 1 through 7 at least 10 times every 1-2 hours when you are awake. Take your time and take a few normal breaths between deep breaths. 9. The spirometer may include an indicator to show your best effort. Use the  indicator as a goal to work toward during each repetition. 10. After each set of 10 deep breaths, practice coughing to be sure your lungs are clear. If you have an incision (the cut made at the time of surgery), support your incision when coughing by placing a pillow or rolled up towels firmly against it. Once you are able to get out of bed, walk around indoors and cough well. You may stop using the incentive spirometer when instructed by your caregiver.  RISKS AND COMPLICATIONS  Take your time so you do not get dizzy or light-headed.  If you are in pain, you may need to take or ask for pain medication before doing incentive spirometry. It is harder to take a deep breath if you are having pain. AFTER USE  Rest and breathe slowly and easily.  It can be helpful to keep track of a log of your progress. Your caregiver can provide you with a simple table to help with this. If you are using the spirometer at home, follow these instructions: [place] IF:   You are having difficultly using the spirometer.  You have trouble using the spirometer as often as instructed.  Your pain medication is not giving enough relief while using the spirometer.  You develop fever of 100.5 F (38.1 C) or higher. SEEK IMMEDIATE MEDICAL CARE IF:   You cough up bloody sputum that had not been present before.  You develop fever of 102 F (38.9 C) or greater.  You develop worsening pain at or near the incision site. MAKE SURE YOU:   Understand these instructions.  Will watch your condition.  Will get help right away if you  are not doing well or get worse. Document Released: 01/14/2007 Document Revised: 11/26/2011 Document Reviewed: 03/17/2007 ExitCare Patient Information 2014 ExitCare, [place].   ________________________________________________________________________  WHAT IS A BLOOD TRANSFUSION? Blood Transfusion Information  A transfusion is the replacement of blood or some of its parts. Blood is made up of multiple cells which provide different functions.  Red blood cells carry oxygen and are used for blood loss replacement.  White blood cells fight against infection.  Platelets control bleeding.  Plasma helps clot blood.  Other blood products are available for specialized needs, such as hemophilia or other clotting disorders. BEFORE THE TRANSFUSION  Who gives blood for transfusions?   Healthy volunteers who are fully evaluated to make sure their blood is safe. This is blood bank blood. Transfusion therapy is the safest it has ever been in the practice of medicine. Before blood is taken from a donor, a complete history is taken to make sure that person has no history of diseases nor engages in risky social behavior (examples are intravenous drug use or sexual activity with multiple partners). The donor's travel history is screened to minimize risk of transmitting infections, such as malaria. The donated blood is tested for signs of infectious diseases, such as HIV and hepatitis. The blood is then tested to be sure it is compatible with you in order to minimize the chance of a transfusion reaction. If you or a relative donates blood, this is often done in anticipation of surgery and is not appropriate for emergency situations. It takes many days to process the donated blood. RISKS AND COMPLICATIONS Although transfusion therapy is very safe and saves many lives, the main dangers of transfusion include:   Getting an infectious disease.  Developing a transfusion reaction. This is an allergic reaction to  something in the blood you  were given. Every precaution is taken to prevent this. The decision to have a blood transfusion has been considered carefully by your caregiver before blood is given. Blood is not given unless the benefits outweigh the risks. AFTER THE TRANSFUSION  Right after receiving a blood transfusion, you will usually feel much better and more energetic. This is especially true if your red blood cells have gotten low (anemic). The transfusion raises the level of the red blood cells which carry oxygen, and this usually causes an energy increase.  The nurse administering the transfusion will monitor you carefully for complications. HOME CARE INSTRUCTIONS  No special instructions are needed after a transfusion. You may find your energy is better. Speak with your caregiver about any limitations on activity for underlying diseases you may have. SEEK MEDICAL CARE IF:   Your condition is not improving after your transfusion.  You develop redness or irritation at the intravenous (IV) site. SEEK IMMEDIATE MEDICAL CARE IF:  Any of the following symptoms occur over the next 12 hours:  Shaking chills.  You have a temperature by mouth above 102 F (38.9 C), not controlled by medicine.  Chest, back, or muscle pain.  People around you feel you are not acting correctly or are confused.  Shortness of breath or difficulty breathing.  Dizziness and fainting.  You get a rash or develop hives.  You have a decrease in urine output.  Your urine turns a dark color or changes to pink, red, or brown. Any of the following symptoms occur over the next 10 days:  You have a temperature by mouth above 102 F (38.9 C), not controlled by medicine.  Shortness of breath.  Weakness after normal activity.  The white part of the eye turns yellow (jaundice).  You have a decrease in the amount of urine or are urinating less often.  Your urine turns a dark color or changes to pink, red, or  brown. Document Released: 08/31/2000 Document Revised: 11/26/2011 Document Reviewed: 04/19/2008 [facility] Patient Information 2014 [place], [place].  _______________________________________________________________________

## 2014-02-18 ENCOUNTER — Other Ambulatory Visit: Payer: Self-pay | Admitting: Orthopedic Surgery

## 2014-02-18 NOTE — H&P (Signed)
Hayley [name] DOB: 12/25/1945 Married / Language: English / Race: White Female  Date of Admission:  02/24/2014  Chief Complaint:  Unstable Left Total Knee  History of Present Illness The patient is a 67 year old female who comes in for a preoperative History and Physical. The patient is scheduled for a left total knee arthroplasty (poly exchange versus revision left knee) to be performed by Dr. Frank V. Aluisio, MD at [facility] on 02-24-2014. She still has significant problems in the left knee. When I saw her initially I felt as though she had an unstable total knee. We put her in a brace and she said it has helped a little bit but she does not want to continue with the brace as it is very cumbersome and limits her activities. It has also not eliminated all of her discomfort. We aspirated her knee last time and all the studies came back negative. Sed rate and C reactive protein were also normal range. She also had a bone scan done which was positive in all three phases which was consistent with either synovitis, infection or stress change. It was throughout the joints so less likely due to aseptic loosening. She is very frustrated with that left knee and wants to do what it will take to get it fixed. It is felt that she would benefit from undergoing a poly exchange versus revision of the left total knee replacement. They have been treated conservatively in the past for the above stated problem and despite conservative measures, they continue to have progressive pain and severe functional limitations and dysfunction. They have failed non-operative management including home exercise, medications, bracing and aspiration. It is felt that they would benefit from undergoing poly exchange versus revsion left total joint replacement. Risks and benefits of the procedure have been discussed with the patient and they elect to proceed with surgery. There are no active contraindications to surgery  such as ongoing infection or rapidly progressive neurological disease.   Allergies Darvon *ANALGESICS - OPIOID*. Sick Lodine *ANALGESICS - ANTI-INFLAMMATORY*. Nausea, Vomiting. BEE VENOM Penicillin VK *PENICILLINS*. Rash. Morphine Sulfate ER *ANALGESICS - OPIOID*. 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 DVT Sleep Apnea. uses CPAP Skin Cancer Urinary Incontinence Pulmonary Embolism. Date: 11/2011. Following Emergent Appendectomy   Family History Hypertension. mother and father Heart disease in female family member before age 65 Liver Disease, Chronic. father Heart Disease. sister, grandmother mothers side, grandfather mothers side, grandmother fathers side and grandfather fathers side Cerebrovascular Accident. mother Heart disease in female family member before age 55 Drug / Alcohol Addiction. mother and father Osteoarthritis. First Degree Relatives. mother and sister Diabetes Mellitus. mother and sister   Social History Marital status. married Alcohol use. former drinker Tobacco / smoke exposure. no Living situation. live with spouse Illicit drug use. no Exercise. Exercises rarely; does gym / weights Tobacco use. Former smoker. former smoker; smoke(d) less than 1/2 pack(s) per day Number of flights of stairs before winded. 1 Drug/Alcohol Rehab (Previously). no Drug/Alcohol Rehab (Currently). no Current work status. working part time Children. 5 or more Pain Contract. no Advance Directives. Living Will, Healthcare POA Post-Surgical Plans.  Home    Medication History ClonazePAM (0.5MG Tablet, Oral) Active. (qhs)   Cyclobenzaprine HCl (10MG Tablet, Oral) Active. (qhs) Cymbalta (60MG Capsule DR Part, Oral) Active. Gabapentin (800MG Tablet, Oral) Active. (1 in am, 1 at night) Levothyroxine Sodium (25MCG Tablet, Oral) Active. (1 qd) Primidone (50MG Tablet, Oral) Active. (qhs) ProAir HFA (108 (90 Base)MCG/ACT Aerosol Soln, Inhalation) Active. (prn) Ketorolac Tromethamine (10MG Tablet, Oral) Active. (prn) Januvia (100MG Tablet, Oral) Active. (qhs) Zofran (4MG Tablet, Oral) Active. Hydrocodone-Acetaminophen (5-325MG Tablet, Oral) Active. Maxalt ( Oral) Specific dose unknown - Active.   Past Surgical History Total Knee Replacement. left Colon Polyp Removal - Colonoscopy Arthroscopy of Knee. right Carpal Tunnel Repair. bilateral Thyroidectomy; Total Appendectomy Gallbladder Surgery. laporoscopic Hysterectomy. complete (non-cancerous) Cataract Surgery. bilateral Larynx Surgery. Date: 12/2013.    Vitals Weight: 230 lb Height: 66 in Weight was reported by patient. Height was reported by patient. Body Surface Area: 2.2 m Body Mass Index: 37.12 kg/m Pulse: 96 (Regular) Resp.: 14 (Unlabored) BP: 122/88 (Sitting, Left Arm, Standard)     Physical Exam The physical exam findings are as follows:   General Mental Status - Alert, cooperative and good historian. General Appearance- pleasant. Not in acute distress. Orientation- Oriented X3. Build & Nutrition- Well nourished and Well developed.   Head and Neck Head- normocephalic, atraumatic . Neck Global Assessment- supple. no bruit auscultated on the right and no bruit auscultated on the left.   Eye Vision- Wears corrective lenses (readers). Pupil- Bilateral- Regular and Round. Motion- Bilateral- EOMI.   Chest and Lung Exam Auscultation: Breath sounds:- clear at anterior chest wall and - clear at posterior chest  wall. Adventitious sounds:- No Adventitious sounds.   Cardiovascular Auscultation:Rhythm- Regular rate and rhythm. Heart Sounds- S1 WNL and S2 WNL. Murmurs & Other Heart Sounds:Auscultation of the heart reveals - No Murmurs.   Abdomen Palpation/Percussion:Tenderness- Abdomen is non-tender to palpation. Rigidity (guarding)- Abdomen is soft. Auscultation:Auscultation of the abdomen reveals - Bowel sounds normal.   Female Genitourinary   Note: Not done, not pertinent to present illness  Musculoskeletal   Note: On exam she is alert and oriented in no apparent distress.  The right knee shows no effusion today. There is moderate crepitus on range of motion of the right knee. She is nontender. There is no instability.  Left knee shows trace effusion, no warmth. Range is about 3 degrees of hyperextension to about 115 to 120 flexion. She has a significant amount of AP laxity in flexion and also has a varus valgus laxity in extension.   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 Aluisio.  Plan is to go home.  PCP - Dr. Richard Pang  The patient will not receive TXA (tranexamic acid) due to: Pulmonary Embolus  Please note that the patient states she has a tendency to have her pusle ox run from the low 90's into the 80's.  Signed electronically by Harris Kistler L Niema Carrara, III PA-C 

## 2014-02-19 DIAGNOSIS — R490 Dysphonia: Secondary | ICD-10-CM | POA: Diagnosis not present

## 2014-02-23 ENCOUNTER — Institutional Professional Consult (permissible substitution): Payer: Medicare Other | Admitting: Pulmonary Disease

## 2014-02-24 ENCOUNTER — Encounter (HOSPITAL_COMMUNITY): Payer: Self-pay | Admitting: *Deleted

## 2014-02-24 ENCOUNTER — Inpatient Hospital Stay (HOSPITAL_COMMUNITY)
Admission: RE | Admit: 2014-02-24 | Discharge: 2014-02-27 | DRG: 468 | Disposition: A | Discharge status: Skilled Nursing Facility | Payer: Medicare Other | Source: Ambulatory Visit | Attending: Orthopedic Surgery | Admitting: Orthopedic Surgery

## 2014-02-24 ENCOUNTER — Encounter (HOSPITAL_COMMUNITY): Payer: Medicare Other | Admitting: Anesthesiology

## 2014-02-24 ENCOUNTER — Encounter (HOSPITAL_COMMUNITY)
Admission: RE | Disposition: A | Discharge status: Skilled Nursing Facility | Payer: Self-pay | Source: Ambulatory Visit | Attending: Orthopedic Surgery

## 2014-02-24 ENCOUNTER — Inpatient Hospital Stay (HOSPITAL_COMMUNITY): Payer: Medicare Other | Admitting: Anesthesiology

## 2014-02-24 DIAGNOSIS — T84018A Broken internal joint prosthesis, other site, initial encounter: Secondary | ICD-10-CM

## 2014-02-24 DIAGNOSIS — Y831 Surgical operation with implant of artificial internal device as the cause of abnormal reaction of the patient, or of later complication, without mention of misadventure at the time of the procedure: Secondary | ICD-10-CM | POA: Diagnosis present

## 2014-02-24 DIAGNOSIS — T84039A Mechanical loosening of unspecified internal prosthetic joint, initial encounter: Secondary | ICD-10-CM | POA: Diagnosis present

## 2014-02-24 DIAGNOSIS — M1710 Unilateral primary osteoarthritis, unspecified knee: Secondary | ICD-10-CM | POA: Diagnosis not present

## 2014-02-24 DIAGNOSIS — R279 Unspecified lack of coordination: Secondary | ICD-10-CM | POA: Diagnosis not present

## 2014-02-24 DIAGNOSIS — R269 Unspecified abnormalities of gait and mobility: Secondary | ICD-10-CM | POA: Diagnosis not present

## 2014-02-24 DIAGNOSIS — G8929 Other chronic pain: Secondary | ICD-10-CM | POA: Diagnosis present

## 2014-02-24 DIAGNOSIS — E669 Obesity, unspecified: Secondary | ICD-10-CM | POA: Diagnosis present

## 2014-02-24 DIAGNOSIS — E119 Type 2 diabetes mellitus without complications: Secondary | ICD-10-CM | POA: Diagnosis not present

## 2014-02-24 DIAGNOSIS — F3289 Other specified depressive episodes: Secondary | ICD-10-CM | POA: Diagnosis not present

## 2014-02-24 DIAGNOSIS — M1990 Unspecified osteoarthritis, unspecified site: Secondary | ICD-10-CM | POA: Diagnosis not present

## 2014-02-24 DIAGNOSIS — M659 Unspecified synovitis and tenosynovitis, unspecified site: Secondary | ICD-10-CM | POA: Diagnosis present

## 2014-02-24 DIAGNOSIS — T84498A Other mechanical complication of other internal orthopedic devices, implants and grafts, initial encounter: Secondary | ICD-10-CM | POA: Diagnosis not present

## 2014-02-24 DIAGNOSIS — E039 Hypothyroidism, unspecified: Secondary | ICD-10-CM | POA: Diagnosis not present

## 2014-02-24 DIAGNOSIS — F411 Generalized anxiety disorder: Secondary | ICD-10-CM | POA: Diagnosis not present

## 2014-02-24 DIAGNOSIS — R32 Unspecified urinary incontinence: Secondary | ICD-10-CM | POA: Diagnosis present

## 2014-02-24 DIAGNOSIS — R1312 Dysphagia, oropharyngeal phase: Secondary | ICD-10-CM | POA: Diagnosis not present

## 2014-02-24 DIAGNOSIS — Z87891 Personal history of nicotine dependence: Secondary | ICD-10-CM | POA: Diagnosis not present

## 2014-02-24 DIAGNOSIS — Z01812 Encounter for preprocedural laboratory examination: Secondary | ICD-10-CM

## 2014-02-24 DIAGNOSIS — M25569 Pain in unspecified knee: Secondary | ICD-10-CM | POA: Diagnosis not present

## 2014-02-24 DIAGNOSIS — K219 Gastro-esophageal reflux disease without esophagitis: Secondary | ICD-10-CM | POA: Diagnosis present

## 2014-02-24 DIAGNOSIS — T84093A Other mechanical complication of internal left knee prosthesis, initial encounter: Secondary | ICD-10-CM

## 2014-02-24 DIAGNOSIS — G609 Hereditary and idiopathic neuropathy, unspecified: Secondary | ICD-10-CM | POA: Diagnosis present

## 2014-02-24 DIAGNOSIS — F329 Major depressive disorder, single episode, unspecified: Secondary | ICD-10-CM | POA: Diagnosis not present

## 2014-02-24 DIAGNOSIS — E1149 Type 2 diabetes mellitus with other diabetic neurological complication: Secondary | ICD-10-CM | POA: Diagnosis not present

## 2014-02-24 DIAGNOSIS — Z471 Aftercare following joint replacement surgery: Secondary | ICD-10-CM | POA: Diagnosis not present

## 2014-02-24 DIAGNOSIS — M5137 Other intervertebral disc degeneration, lumbosacral region: Secondary | ICD-10-CM | POA: Diagnosis present

## 2014-02-24 DIAGNOSIS — S8990XA Unspecified injury of unspecified lower leg, initial encounter: Secondary | ICD-10-CM | POA: Diagnosis not present

## 2014-02-24 DIAGNOSIS — T84029A Dislocation of unspecified internal joint prosthesis, initial encounter: Secondary | ICD-10-CM | POA: Diagnosis not present

## 2014-02-24 DIAGNOSIS — M6281 Muscle weakness (generalized): Secondary | ICD-10-CM | POA: Diagnosis not present

## 2014-02-24 DIAGNOSIS — E109 Type 1 diabetes mellitus without complications: Secondary | ICD-10-CM | POA: Diagnosis present

## 2014-02-24 DIAGNOSIS — J45909 Unspecified asthma, uncomplicated: Secondary | ICD-10-CM | POA: Diagnosis present

## 2014-02-24 DIAGNOSIS — Z86711 Personal history of pulmonary embolism: Secondary | ICD-10-CM | POA: Diagnosis not present

## 2014-02-24 DIAGNOSIS — Z96659 Presence of unspecified artificial knee joint: Secondary | ICD-10-CM

## 2014-02-24 DIAGNOSIS — Z794 Long term (current) use of insulin: Secondary | ICD-10-CM | POA: Diagnosis not present

## 2014-02-24 DIAGNOSIS — Z6835 Body mass index (BMI) 35.0-35.9, adult: Secondary | ICD-10-CM

## 2014-02-24 DIAGNOSIS — G4730 Sleep apnea, unspecified: Secondary | ICD-10-CM | POA: Diagnosis present

## 2014-02-24 DIAGNOSIS — R490 Dysphonia: Secondary | ICD-10-CM | POA: Diagnosis not present

## 2014-02-24 DIAGNOSIS — Z86718 Personal history of other venous thrombosis and embolism: Secondary | ICD-10-CM | POA: Diagnosis not present

## 2014-02-24 DIAGNOSIS — S99919A Unspecified injury of unspecified ankle, initial encounter: Secondary | ICD-10-CM | POA: Diagnosis not present

## 2014-02-24 DIAGNOSIS — M51379 Other intervertebral disc degeneration, lumbosacral region without mention of lumbar back pain or lower extremity pain: Secondary | ICD-10-CM | POA: Diagnosis present

## 2014-02-24 HISTORY — DX: Pain in right knee: M25.561

## 2014-02-24 HISTORY — PX: TOTAL KNEE REVISION: SHX996

## 2014-02-24 LAB — TYPE AND SCREEN
ABO/RH(D): O POS
Antibody Screen: NEGATIVE

## 2014-02-24 LAB — GRAM STAIN

## 2014-02-24 LAB — GLUCOSE, CAPILLARY
Glucose-Capillary: 113 mg/dL — ABNORMAL HIGH (ref 70–99)
Glucose-Capillary: 163 mg/dL — ABNORMAL HIGH (ref 70–99)
Glucose-Capillary: 213 mg/dL — ABNORMAL HIGH (ref 70–99)

## 2014-02-24 LAB — ABO/RH: ABO/RH(D): O POS

## 2014-02-24 SURGERY — TOTAL KNEE REVISION
Anesthesia: Spinal | Site: Knee | Laterality: Left

## 2014-02-24 MED ORDER — 0.9 % SODIUM CHLORIDE (POUR BTL) OPTIME
TOPICAL | Status: DC | PRN
  Administered 2014-02-24: 1000 mL

## 2014-02-24 MED ORDER — MOMETASONE FURO-FORMOTEROL FUM 100-5 MCG/ACT IN AERO
2.0000 | INHALATION_SPRAY | Freq: Two times a day (BID) | RESPIRATORY_TRACT | Status: DC
  Administered 2014-02-25 – 2014-02-27 (×5): 2 via RESPIRATORY_TRACT
  Filled 2014-02-24: qty 8.8

## 2014-02-24 MED ORDER — PROPOFOL 10 MG/ML IV BOLUS
INTRAVENOUS | Status: AC
  Filled 2014-02-24: qty 20

## 2014-02-24 MED ORDER — SODIUM CHLORIDE 0.9 % IJ SOLN
INTRAMUSCULAR | Status: AC
  Filled 2014-02-24: qty 50

## 2014-02-24 MED ORDER — LACTATED RINGERS IV SOLN
INTRAVENOUS | Status: DC | PRN
  Administered 2014-02-24 (×2): via INTRAVENOUS

## 2014-02-24 MED ORDER — OXYCODONE HCL 5 MG/5ML PO SOLN
5.0000 mg | Freq: Once | ORAL | Status: DC | PRN
  Filled 2014-02-24: qty 5

## 2014-02-24 MED ORDER — ONDANSETRON HCL 4 MG/2ML IJ SOLN: 4.0000 mg | Freq: Four times a day (QID) | INTRAMUSCULAR | Status: DC | PRN

## 2014-02-24 MED ORDER — ALBUTEROL SULFATE (2.5 MG/3ML) 0.083% IN NEBU
2.5000 mg | INHALATION_SOLUTION | Freq: Four times a day (QID) | RESPIRATORY_TRACT | Status: DC | PRN
  Administered 2014-02-25 – 2014-02-26 (×3): 2.5 mg via RESPIRATORY_TRACT

## 2014-02-24 MED ORDER — CEFAZOLIN SODIUM-DEXTROSE 2-3 GM-% IV SOLR
2.0000 g | INTRAVENOUS | Status: AC
  Administered 2014-02-24: 2 g via INTRAVENOUS

## 2014-02-24 MED ORDER — MIDAZOLAM HCL 2 MG/2ML IJ SOLN
INTRAMUSCULAR | Status: AC
  Filled 2014-02-24: qty 2

## 2014-02-24 MED ORDER — VANCOMYCIN HCL IN DEXTROSE 1-5 GM/200ML-% IV SOLN
1000.0000 mg | Freq: Two times a day (BID) | INTRAVENOUS | Status: AC
  Administered 2014-02-24: 1000 mg via INTRAVENOUS
  Filled 2014-02-24: qty 200

## 2014-02-24 MED ORDER — DEXAMETHASONE SODIUM PHOSPHATE 10 MG/ML IJ SOLN
10.0000 mg | Freq: Once | INTRAMUSCULAR | Status: AC
  Administered 2014-02-24: 10 mg via INTRAVENOUS

## 2014-02-24 MED ORDER — CYCLOBENZAPRINE HCL 10 MG PO TABS
10.0000 mg | ORAL_TABLET | Freq: Every day | ORAL | Status: DC
  Administered 2014-02-24 – 2014-02-26 (×3): 10 mg via ORAL
  Filled 2014-02-24 (×5): qty 1

## 2014-02-24 MED ORDER — PROPOFOL INFUSION 10 MG/ML OPTIME
INTRAVENOUS | Status: DC | PRN
  Administered 2014-02-24: 50 ug/kg/min via INTRAVENOUS

## 2014-02-24 MED ORDER — FENTANYL CITRATE 0.05 MG/ML IJ SOLN
INTRAMUSCULAR | Status: AC
  Filled 2014-02-24: qty 2

## 2014-02-24 MED ORDER — MIDAZOLAM HCL 5 MG/5ML IJ SOLN
INTRAMUSCULAR | Status: DC | PRN
  Administered 2014-02-24: 2 mg via INTRAVENOUS

## 2014-02-24 MED ORDER — RIVAROXABAN 10 MG PO TABS
10.0000 mg | ORAL_TABLET | Freq: Every day | ORAL | Status: DC
  Administered 2014-02-25 – 2014-02-27 (×3): 10 mg via ORAL
  Filled 2014-02-24 (×4): qty 1

## 2014-02-24 MED ORDER — INSULIN ASPART 100 UNIT/ML ~~LOC~~ SOLN
0.0000 [IU] | Freq: Three times a day (TID) | SUBCUTANEOUS | Status: DC
  Administered 2014-02-24 – 2014-02-25 (×4): 3 [IU] via SUBCUTANEOUS

## 2014-02-24 MED ORDER — HYDROMORPHONE HCL PF 1 MG/ML IJ SOLN
0.5000 mg | INTRAMUSCULAR | Status: DC | PRN
  Administered 2014-02-24 – 2014-02-25 (×5): 1 mg via INTRAVENOUS
  Filled 2014-02-24 (×5): qty 1

## 2014-02-24 MED ORDER — ACETAMINOPHEN 325 MG PO TABS
650.0000 mg | ORAL_TABLET | Freq: Four times a day (QID) | ORAL | Status: DC | PRN
  Administered 2014-02-26: 650 mg via ORAL
  Filled 2014-02-24: qty 2

## 2014-02-24 MED ORDER — DEXAMETHASONE SODIUM PHOSPHATE 10 MG/ML IJ SOLN
INTRAMUSCULAR | Status: AC
  Filled 2014-02-24: qty 1

## 2014-02-24 MED ORDER — DULOXETINE HCL 60 MG PO CPEP
60.0000 mg | ORAL_CAPSULE | Freq: Every morning | ORAL | Status: DC
  Administered 2014-02-25 – 2014-02-27 (×3): 60 mg via ORAL
  Filled 2014-02-24 (×3): qty 1

## 2014-02-24 MED ORDER — CEFAZOLIN SODIUM-DEXTROSE 2-3 GM-% IV SOLR
INTRAVENOUS | Status: AC
  Filled 2014-02-24: qty 50

## 2014-02-24 MED ORDER — SODIUM CHLORIDE 0.9 % IV SOLN: INTRAVENOUS | Status: DC

## 2014-02-24 MED ORDER — ALBUTEROL SULFATE (2.5 MG/3ML) 0.083% IN NEBU
3.0000 mL | INHALATION_SOLUTION | Freq: Four times a day (QID) | RESPIRATORY_TRACT | Status: DC | PRN
  Filled 2014-02-24 (×3): qty 3

## 2014-02-24 MED ORDER — CLONAZEPAM 0.5 MG PO TABS
0.5000 mg | ORAL_TABLET | Freq: Every day | ORAL | Status: DC
  Administered 2014-02-24 – 2014-02-26 (×3): 0.5 mg via ORAL
  Filled 2014-02-24 (×3): qty 1

## 2014-02-24 MED ORDER — PHENOL 1.4 % MT LIQD
1.0000 | OROMUCOSAL | Status: DC | PRN
  Filled 2014-02-24: qty 177

## 2014-02-24 MED ORDER — HYDROMORPHONE HCL PF 1 MG/ML IJ SOLN
0.2500 mg | INTRAMUSCULAR | Status: DC | PRN
  Administered 2014-02-24 (×2): 0.5 mg via INTRAVENOUS

## 2014-02-24 MED ORDER — BISACODYL 10 MG RE SUPP: 10.0000 mg | Freq: Every day | RECTAL | Status: DC | PRN

## 2014-02-24 MED ORDER — LINAGLIPTIN 5 MG PO TABS
5.0000 mg | ORAL_TABLET | Freq: Every day | ORAL | Status: DC
  Administered 2014-02-24 – 2014-02-26 (×3): 5 mg via ORAL
  Filled 2014-02-24 (×4): qty 1

## 2014-02-24 MED ORDER — DEXAMETHASONE SODIUM PHOSPHATE 10 MG/ML IJ SOLN
10.0000 mg | Freq: Every day | INTRAMUSCULAR | Status: AC
Start: 2014-02-25 — End: 2014-02-25
  Administered 2014-02-25: 10 mg via INTRAVENOUS
  Filled 2014-02-24: qty 1

## 2014-02-24 MED ORDER — BUPIVACAINE IN DEXTROSE 0.75-8.25 % IT SOLN
INTRATHECAL | Status: DC | PRN
  Administered 2014-02-24: 2 mL via INTRATHECAL

## 2014-02-24 MED ORDER — LEVOTHYROXINE SODIUM 25 MCG PO TABS
25.0000 ug | ORAL_TABLET | Freq: Every day | ORAL | Status: DC
  Administered 2014-02-25 – 2014-02-27 (×3): 25 ug via ORAL
  Filled 2014-02-24 (×4): qty 1

## 2014-02-24 MED ORDER — MEPERIDINE HCL 50 MG/ML IJ SOLN: 6.2500 mg | INTRAMUSCULAR | Status: DC | PRN

## 2014-02-24 MED ORDER — DEXAMETHASONE 6 MG PO TABS
10.0000 mg | ORAL_TABLET | Freq: Every day | ORAL | Status: AC
  Filled 2014-02-24: qty 1

## 2014-02-24 MED ORDER — BUPIVACAINE LIPOSOME 1.3 % IJ SUSP
20.0000 mL | Freq: Once | INTRAMUSCULAR | Status: DC
  Filled 2014-02-24: qty 20

## 2014-02-24 MED ORDER — ONDANSETRON HCL 4 MG/2ML IJ SOLN
INTRAMUSCULAR | Status: DC | PRN
  Administered 2014-02-24: 4 mg via INTRAVENOUS

## 2014-02-24 MED ORDER — DOCUSATE SODIUM 100 MG PO CAPS
100.0000 mg | ORAL_CAPSULE | Freq: Two times a day (BID) | ORAL | Status: DC
  Administered 2014-02-24 – 2014-02-27 (×6): 100 mg via ORAL

## 2014-02-24 MED ORDER — METHOCARBAMOL 500 MG PO TABS
500.0000 mg | ORAL_TABLET | Freq: Four times a day (QID) | ORAL | Status: DC | PRN
  Administered 2014-02-26: 500 mg via ORAL
  Filled 2014-02-24 (×2): qty 1

## 2014-02-24 MED ORDER — ONDANSETRON HCL 4 MG PO TABS: 8.0000 mg | ORAL_TABLET | Freq: Three times a day (TID) | ORAL | Status: DC | PRN

## 2014-02-24 MED ORDER — SODIUM CHLORIDE 0.9 % IJ SOLN
INTRAMUSCULAR | Status: DC | PRN
  Administered 2014-02-24: 30 mL via INTRAVENOUS

## 2014-02-24 MED ORDER — PANTOPRAZOLE SODIUM 40 MG PO TBEC
40.0000 mg | DELAYED_RELEASE_TABLET | Freq: Every day | ORAL | Status: DC
  Administered 2014-02-25 – 2014-02-27 (×3): 40 mg via ORAL
  Filled 2014-02-24 (×4): qty 1

## 2014-02-24 MED ORDER — LACTATED RINGERS IV SOLN
INTRAVENOUS | Status: DC
Start: 2014-02-24 — End: 2014-02-24

## 2014-02-24 MED ORDER — HYDROMORPHONE HCL PF 1 MG/ML IJ SOLN
INTRAMUSCULAR | Status: AC
  Administered 2014-02-24: 1 mg via INTRAVENOUS
  Filled 2014-02-24: qty 1

## 2014-02-24 MED ORDER — SUMATRIPTAN SUCCINATE 100 MG PO TABS
100.0000 mg | ORAL_TABLET | ORAL | Status: DC | PRN
  Administered 2014-02-24 – 2014-02-26 (×3): 100 mg via ORAL
  Filled 2014-02-24 (×3): qty 1

## 2014-02-24 MED ORDER — OXYCODONE HCL 5 MG PO TABS: 5.0000 mg | ORAL_TABLET | ORAL | Status: DC | PRN

## 2014-02-24 MED ORDER — ONDANSETRON HCL 4 MG PO TABS: 4.0000 mg | ORAL_TABLET | Freq: Four times a day (QID) | ORAL | Status: DC | PRN

## 2014-02-24 MED ORDER — PROMETHAZINE HCL 25 MG/ML IJ SOLN: 6.2500 mg | INTRAMUSCULAR | Status: DC | PRN

## 2014-02-24 MED ORDER — METOCLOPRAMIDE HCL 5 MG/ML IJ SOLN: 5.0000 mg | Freq: Three times a day (TID) | INTRAMUSCULAR | Status: DC | PRN

## 2014-02-24 MED ORDER — DIPHENHYDRAMINE HCL 12.5 MG/5ML PO ELIX
12.5000 mg | ORAL_SOLUTION | ORAL | Status: DC | PRN
  Administered 2014-02-25: 12.5 mg via ORAL
  Administered 2014-02-26: 25 mg via ORAL
  Filled 2014-02-24: qty 10
  Filled 2014-02-24: qty 5

## 2014-02-24 MED ORDER — METOCLOPRAMIDE HCL 10 MG PO TABS
5.0000 mg | ORAL_TABLET | Freq: Three times a day (TID) | ORAL | Status: DC | PRN
  Administered 2014-02-27: 10 mg via ORAL
  Filled 2014-02-24: qty 1

## 2014-02-24 MED ORDER — CHLORHEXIDINE GLUCONATE 4 % EX LIQD: 60.0000 mL | Freq: Once | CUTANEOUS | Status: DC

## 2014-02-24 MED ORDER — PROPOFOL 10 MG/ML IV BOLUS
INTRAVENOUS | Status: DC | PRN
  Administered 2014-02-24: 20 mg via INTRAVENOUS

## 2014-02-24 MED ORDER — PRIMIDONE 50 MG PO TABS
50.0000 mg | ORAL_TABLET | Freq: Every day | ORAL | Status: DC
  Administered 2014-02-24 – 2014-02-26 (×3): 50 mg via ORAL
  Filled 2014-02-24 (×4): qty 1

## 2014-02-24 MED ORDER — BUPIVACAINE-EPINEPHRINE (PF) 0.25% -1:200000 IJ SOLN
INTRAMUSCULAR | Status: DC | PRN
  Administered 2014-02-24: 30 mL

## 2014-02-24 MED ORDER — OXYCODONE HCL 5 MG PO TABS: 5.0000 mg | ORAL_TABLET | Freq: Once | ORAL | Status: DC | PRN

## 2014-02-24 MED ORDER — SUMATRIPTAN SUCCINATE 6 MG/0.5ML ~~LOC~~ SOLN
6.0000 mg | Freq: Once | SUBCUTANEOUS | Status: AC
  Administered 2014-02-24: 6 mg via SUBCUTANEOUS
  Filled 2014-02-24 (×2): qty 0.5

## 2014-02-24 MED ORDER — BENZONATATE 100 MG PO CAPS
100.0000 mg | ORAL_CAPSULE | Freq: Three times a day (TID) | ORAL | Status: DC | PRN
  Administered 2014-02-25 – 2014-02-26 (×3): 100 mg via ORAL
  Filled 2014-02-24 (×3): qty 1

## 2014-02-24 MED ORDER — FENTANYL CITRATE 0.05 MG/ML IJ SOLN
INTRAMUSCULAR | Status: DC | PRN
Start: 2014-02-24 — End: 2014-02-24
  Administered 2014-02-24 (×4): 50 ug via INTRAVENOUS

## 2014-02-24 MED ORDER — POLYETHYLENE GLYCOL 3350 17 G PO PACK: 17.0000 g | PACK | Freq: Every day | ORAL | Status: DC | PRN

## 2014-02-24 MED ORDER — ONDANSETRON HCL 4 MG/2ML IJ SOLN
4.0000 mg | Freq: Once | INTRAMUSCULAR | Status: AC
  Administered 2014-02-24: 4 mg via INTRAVENOUS

## 2014-02-24 MED ORDER — KETOROLAC TROMETHAMINE 15 MG/ML IJ SOLN
7.5000 mg | Freq: Four times a day (QID) | INTRAMUSCULAR | Status: AC | PRN
  Administered 2014-02-25: 7.5 mg via INTRAVENOUS
  Filled 2014-02-24: qty 1

## 2014-02-24 MED ORDER — METHOCARBAMOL 1000 MG/10ML IJ SOLN
500.0000 mg | Freq: Four times a day (QID) | INTRAMUSCULAR | Status: DC | PRN
  Administered 2014-02-24 (×2): 500 mg via INTRAVENOUS
  Filled 2014-02-24 (×2): qty 5

## 2014-02-24 MED ORDER — FLEET ENEMA 7-19 GM/118ML RE ENEM: 1.0000 | ENEMA | Freq: Once | RECTAL | Status: AC | PRN

## 2014-02-24 MED ORDER — ACETAMINOPHEN 650 MG RE SUPP: 650.0000 mg | Freq: Four times a day (QID) | RECTAL | Status: DC | PRN

## 2014-02-24 MED ORDER — ACETAMINOPHEN 500 MG PO TABS
1000.0000 mg | ORAL_TABLET | Freq: Four times a day (QID) | ORAL | Status: AC
  Administered 2014-02-24 – 2014-02-25 (×4): 1000 mg via ORAL
  Filled 2014-02-24 (×5): qty 2

## 2014-02-24 MED ORDER — GABAPENTIN 400 MG PO CAPS
800.0000 mg | ORAL_CAPSULE | Freq: Two times a day (BID) | ORAL | Status: DC
  Administered 2014-02-24 – 2014-02-27 (×6): 800 mg via ORAL
  Filled 2014-02-24 (×7): qty 2

## 2014-02-24 MED ORDER — MENTHOL 3 MG MT LOZG
1.0000 | LOZENGE | OROMUCOSAL | Status: DC | PRN
  Filled 2014-02-24: qty 9

## 2014-02-24 MED ORDER — ONDANSETRON HCL 4 MG/2ML IJ SOLN
INTRAMUSCULAR | Status: AC
  Filled 2014-02-24: qty 2

## 2014-02-24 MED ORDER — BUPIVACAINE LIPOSOME 1.3 % IJ SUSP
INTRAMUSCULAR | Status: DC | PRN
  Administered 2014-02-24: 20 mL

## 2014-02-24 MED ORDER — BUPIVACAINE HCL (PF) 0.25 % IJ SOLN
INTRAMUSCULAR | Status: AC
  Filled 2014-02-24: qty 30

## 2014-02-24 MED ORDER — SODIUM CHLORIDE 0.9 % IV SOLN
INTRAVENOUS | Status: DC
  Administered 2014-02-24: 19:00:00 via INTRAVENOUS

## 2014-02-24 MED ORDER — GABAPENTIN 800 MG PO TABS
800.0000 mg | ORAL_TABLET | Freq: Two times a day (BID) | ORAL | Status: DC
  Filled 2014-02-24: qty 1

## 2014-02-24 MED ORDER — LORATADINE 10 MG PO TABS
10.0000 mg | ORAL_TABLET | Freq: Every day | ORAL | Status: DC
  Administered 2014-02-25 – 2014-02-27 (×3): 10 mg via ORAL
  Filled 2014-02-24 (×3): qty 1

## 2014-02-24 MED ORDER — ACETAMINOPHEN 10 MG/ML IV SOLN
1000.0000 mg | Freq: Once | INTRAVENOUS | Status: AC
  Administered 2014-02-24: 1000 mg via INTRAVENOUS
  Filled 2014-02-24: qty 100

## 2014-02-24 SURGICAL SUPPLY — 75 items
ADAPTER BOLT FEMORAL +2/-2 (Knees) ×2 IMPLANT
BAG ZIPLOCK 12X15 (MISCELLANEOUS) IMPLANT
BANDAGE ELASTIC 6 VELCRO ST LF (GAUZE/BANDAGES/DRESSINGS) ×2 IMPLANT
BANDAGE ESMARK 6X9 LF (GAUZE/BANDAGES/DRESSINGS) ×1 IMPLANT
BLADE SAG 18X100X1.27 (BLADE) ×2 IMPLANT
BLADE SAW SGTL 11.0X1.19X90.0M (BLADE) ×2 IMPLANT
BNDG ESMARK 6X9 LF (GAUZE/BANDAGES/DRESSINGS) ×2
BONE CEMENT GENTAMICIN (Cement) ×6 IMPLANT
CEMENT BONE GENTAMICIN 40 (Cement) ×3 IMPLANT
CEMENT RESTRICTOR DEPUY SZ 4 (Cement) ×2 IMPLANT
CUFF TOURN SGL QUICK 34 (TOURNIQUET CUFF) ×1
CUFF TRNQT CYL 34X4X40X1 (TOURNIQUET CUFF) ×1 IMPLANT
DISTAL WEDGE PFC 4MM LEFT (Knees) ×4 IMPLANT
DRAPE EXTREMITY T 121X128X90 (DRAPE) ×2 IMPLANT
DRAPE POUCH INSTRU U-SHP 10X18 (DRAPES) ×2 IMPLANT
DRAPE U-SHAPE 47X51 STRL (DRAPES) ×2 IMPLANT
DRSG ADAPTIC 3X8 NADH LF (GAUZE/BANDAGES/DRESSINGS) ×2 IMPLANT
DRSG PAD ABDOMINAL 8X10 ST (GAUZE/BANDAGES/DRESSINGS) ×2 IMPLANT
DURAPREP 26ML APPLICATOR (WOUND CARE) ×2 IMPLANT
ELECT REM PT RETURN 9FT ADLT (ELECTROSURGICAL) ×2
ELECTRODE REM PT RTRN 9FT ADLT (ELECTROSURGICAL) ×1 IMPLANT
EVACUATOR 1/8 PVC DRAIN (DRAIN) ×2 IMPLANT
FACESHIELD WRAPAROUND (MASK) ×10 IMPLANT
FEM TC3 PFC SZ3 LEFT (Orthopedic Implant) ×2 IMPLANT
FEMORAL ADAPTER (Orthopedic Implant) ×2 IMPLANT
FEMORAL TC3 PFC SZ3 LEFT (Orthopedic Implant) ×1 IMPLANT
GAUZE SPONGE 4X4 12PLY STRL (GAUZE/BANDAGES/DRESSINGS) ×2 IMPLANT
GLOVE BIO SURGEON STRL SZ7.5 (GLOVE) IMPLANT
GLOVE BIO SURGEON STRL SZ8 (GLOVE) ×2 IMPLANT
GLOVE BIOGEL PI IND STRL 8 (GLOVE) ×1 IMPLANT
GLOVE BIOGEL PI INDICATOR 8 (GLOVE) ×1
GLOVE SURG SS PI 6.5 STRL IVOR (GLOVE) IMPLANT
GOWN STRL REUS W/TWL LRG LVL3 (GOWN DISPOSABLE) ×2 IMPLANT
GOWN STRL REUS W/TWL XL LVL3 (GOWN DISPOSABLE) IMPLANT
HANDPIECE INTERPULSE COAX TIP (DISPOSABLE) ×1
IMMOBILIZER KNEE 20 (SOFTGOODS) ×2
IMMOBILIZER KNEE 20 THIGH 36 (SOFTGOODS) ×1 IMPLANT
INSERT TIBIAL TC3 15.0 (Knees) ×2 IMPLANT
KIT BASIN OR (CUSTOM PROCEDURE TRAY) ×2 IMPLANT
MANIFOLD NEPTUNE II (INSTRUMENTS) ×2 IMPLANT
NDL SAFETY ECLIPSE 18X1.5 (NEEDLE) ×2 IMPLANT
NEEDLE HYPO 18GX1.5 SHARP (NEEDLE) ×2
NS IRRIG 1000ML POUR BTL (IV SOLUTION) ×2 IMPLANT
PACK TOTAL JOINT (CUSTOM PROCEDURE TRAY) ×2 IMPLANT
PAD ABD 8X10 STRL (GAUZE/BANDAGES/DRESSINGS) ×2 IMPLANT
PADDING CAST ABS 6INX4YD NS (CAST SUPPLIES) ×1
PADDING CAST ABS COTTON 6X4 NS (CAST SUPPLIES) ×1 IMPLANT
PADDING CAST COTTON 6X4 STRL (CAST SUPPLIES) ×4 IMPLANT
POSITIONER SURGICAL ARM (MISCELLANEOUS) ×2 IMPLANT
POST AVE PFC 4MM (Knees) ×4 IMPLANT
SET HNDPC FAN SPRY TIP SCT (DISPOSABLE) ×1 IMPLANT
SPONGE GAUZE 4X4 12PLY (GAUZE/BANDAGES/DRESSINGS) ×2 IMPLANT
STAPLER VISISTAT 35W (STAPLE) IMPLANT
STEM TIBIA PFC 13X30MM (Stem) ×2 IMPLANT
STEM UNIVERSAL REVISION 75X18 (Stem) ×2 IMPLANT
STRIP CLOSURE SKIN 1/2X4 (GAUZE/BANDAGES/DRESSINGS) ×2 IMPLANT
SUCTION FRAZIER 12FR DISP (SUCTIONS) ×2 IMPLANT
SUT MNCRL AB 4-0 PS2 18 (SUTURE) ×2 IMPLANT
SUT VIC AB 2-0 CT1 27 (SUTURE) ×3
SUT VIC AB 2-0 CT1 TAPERPNT 27 (SUTURE) ×3 IMPLANT
SUT VLOC 180 0 24IN GS25 (SUTURE) ×2 IMPLANT
SWAB COLLECTION DEVICE MRSA (MISCELLANEOUS) ×2 IMPLANT
SYR 20CC LL (SYRINGE) ×2 IMPLANT
SYR 50ML LL SCALE MARK (SYRINGE) ×2 IMPLANT
TOWEL OR 17X26 10 PK STRL BLUE (TOWEL DISPOSABLE) ×2 IMPLANT
TOWEL OR NON WOVEN STRL DISP B (DISPOSABLE) IMPLANT
TOWER CARTRIDGE SMART MIX (DISPOSABLE) ×2 IMPLANT
TRAY FOLEY CATH 14FRSI W/METER (CATHETERS) ×2 IMPLANT
TRAY REVISION SZ 2.5 (Knees) ×2 IMPLANT
TRAY SLEEVE CEM ML (Knees) ×2 IMPLANT
TUBE ANAEROBIC SPECIMEN COL (MISCELLANEOUS) ×2 IMPLANT
WATER STERILE IRR 1500ML POUR (IV SOLUTION) ×2 IMPLANT
WEDGE DISTAL PFC LEFT 4MM (Knees) ×2 IMPLANT
WEDGE STEP 2.5 10MM (Knees) ×4 IMPLANT
WRAP KNEE MAXI GEL POST OP (GAUZE/BANDAGES/DRESSINGS) IMPLANT

## 2014-02-24 NOTE — Anesthesia Postprocedure Evaluation (Signed)
Anesthesia Post Note  Patient: Hayley [name]  Procedure(s) Performed: Procedure(s) (LRB): LEFT KNEE POLYETHENE VS TOTAL KNEE REVISION (Left)  Anesthesia type: Spinal  Patient location: PACU  Post pain: Pain level controlled  Post assessment: Post-op Vital signs reviewed  Last Vitals: BP 97/63  Pulse 96  Temp(Src) 36.7 C (Oral)  Resp 14  SpO2 90%  Post vital signs: Reviewed  Level of consciousness: sedated  Complications: No apparent anesthesia complications

## 2014-02-24 NOTE — Progress Notes (Signed)
Utilization review completed.  

## 2014-02-24 NOTE — Transfer of Care (Signed)
Immediate Anesthesia Transfer of Care Note  Patient: Hayley [name]  Procedure(s) Performed: Procedure(s) (LRB): LEFT KNEE POLYETHENE VS TOTAL KNEE REVISION (Left)  Patient Location: PACU  Anesthesia Type: Spinal  Level of Consciousness: sedated, patient cooperative and responds to stimulation  Airway & Oxygen Therapy: Patient Spontanous Breathing and Patient connected to face mask oxgen  Post-op Assessment: Report given to PACU RN and Post -op Vital signs reviewed and stable  Post vital signs: Reviewed and stable  Complications: No apparent anesthesia complications

## 2014-02-24 NOTE — Anesthesia Procedure Notes (Signed)
Spinal  Patient location during procedure: OR Start time: 02/24/2014 10:59 AM End time: 02/24/2014 11:10 AM Staffing Anesthesiologist: Nickie Retort CRNA/Resident: Lajuana Carry E Performed by: anesthesiologist and resident/CRNA  Preanesthetic Checklist Completed: patient identified, site marked, surgical consent, pre-op evaluation, timeout performed, IV checked, risks and benefits discussed and monitors and equipment checked Spinal Block Patient position: sitting Prep: Betadine Patient monitoring: heart rate, continuous pulse ox and blood pressure Approach: midline Location: L3-4 Needle Needle type: Sprotte  Needle gauge: 24 G Needle length: 10 cm Additional Notes Kit expiration checked. Time out performed, SAB x 2, positive clear CSF, neg paresthesia. tol well, return to supine.

## 2014-02-24 NOTE — Interval H&P Note (Signed)
History and Physical Interval Note:  02/24/2014 9:54 AM  Hayley [name]  has presented today for surgery, with the diagnosis of unstable left total knee arthroplasty  The various methods of treatment have been discussed with the patient and family. After consideration of risks, benefits and other options for treatment, the patient has consented to  Procedure(s): LEFT KNEE POLYETHENE VS [place] (Left) as a surgical intervention .  The patient's history has been reviewed, patient examined, no change in status, stable for surgery.  I have reviewed the patient's chart and labs.  Questions were answered to the patient's satisfaction.     Gearlean Alf

## 2014-02-24 NOTE — Brief Op Note (Signed)
02/24/2014  12:36 PM  PATIENT:  Hayley [name]  68 y.o. female  PRE-OPERATIVE DIAGNOSIS:  unstable left total knee arthroplasty  POST-OPERATIVE DIAGNOSIS:  unstable left total knee arthroplasty  PROCEDURE:  LEFT TOTAL KNEE ARTHROPLASTY REVISION  SURGEON:  Surgeon(s) and Role:    Gearlean Alf, MD - Primary  PHYSICIAN ASSISTANT:   ASSISTANTS: Molli Barrows, PA    ANESTHESIA:   spinal  EBL:  Total I/O In: 1000 [I.V.:1000] Out: -   BLOOD ADMINISTERED:none  DRAINS: (Medium) Hemovact drain(s) in the left knee with  Suction Open   LOCAL MEDICATIONS USED:  OTHER Exparel  COUNTS:  YES  TOURNIQUET:  * Missing tourniquet times found for documented tourniquets in log:  [id number] * Total Tourniquet Time Documented: Thigh (Left) - 34 minutes Total: Thigh (Left) - 34 minutes   DICTATION: .Other Dictation: Dictation Number [id number]  PLAN OF CARE: Admit to inpatient   PATIENT DISPOSITION:  PACU - hemodynamically stable.

## 2014-02-24 NOTE — Plan of Care (Signed)
Problem: Consults Goal: Diagnosis- Total Joint Replacement Outcome: Progressing Revision Total Knee

## 2014-02-24 NOTE — H&P (View-Only) (Signed)
Hayley [name]. Ager DOB: 11/24/1945 Married / Language: English / Race: White Female  Date of Admission:  02/24/2014  Chief Complaint:  Unstable Left Total Knee  History of Present Illness The patient is a 68 year old female who comes in for a preoperative History and Physical. The patient is scheduled for a left total knee arthroplasty (poly exchange versus revision left knee) to be performed by Dr. Dione Plover. Aluisio, MD at [facility] on 02-24-2014. She still has significant problems in the left knee. When I saw her initially I felt as though she had an unstable total knee. We put her in a brace and she said it has helped a little bit but she does not want to continue with the brace as it is very cumbersome and limits her activities. It has also not eliminated all of her discomfort. We aspirated her knee last time and all the studies came back negative. Sed rate and C reactive protein were also normal range. She also had a bone scan done which was positive in all three phases which was consistent with either synovitis, infection or stress change. It was throughout the joints so less likely due to aseptic loosening. She is very frustrated with that left knee and wants to do what it will take to get it fixed. It is felt that she would benefit from undergoing a poly exchange versus revision of the left total knee replacement. They have been treated conservatively in the past for the above stated problem and despite conservative measures, they continue to have progressive pain and severe functional limitations and dysfunction. They have failed non-operative management including home exercise, medications, bracing and aspiration. It is felt that they would benefit from undergoing poly exchange versus revsion left total joint replacement. Risks and benefits of the procedure have been discussed with the patient and they elect to proceed with surgery. There are no active contraindications to surgery  such as ongoing infection or rapidly progressive neurological disease.   Allergies Darvon *ANALGESICS - OPIOID*. Sick Lodine *ANALGESICS - ANTI-INFLAMMATORY*. Nausea, Vomiting. BEE VENOM Penicillin VK *PENICILLINS*. Rash. Morphine Sulfate ER *ANALGESICS - OPIOID*. 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 DVT Sleep Apnea. uses CPAP Skin Cancer Urinary Incontinence Pulmonary Embolism. Date: 11/2011. Following Emergent Appendectomy   Family History Hypertension. mother and father Heart disease in female family member before age 46 Liver Disease, Chronic. father Heart Disease. sister, grandmother mothers side, grandfather mothers side, grandmother fathers side and grandfather fathers side Cerebrovascular Accident. mother Heart disease in female family member before age 66 Drug / Alcohol Addiction. mother and father Osteoarthritis. First Degree Relatives. mother and sister Diabetes Mellitus. mother and sister   Social History Marital status. married Alcohol use. former drinker Tobacco / smoke exposure. no Living situation. live with spouse Illicit drug use. no Exercise. Exercises rarely; does gym / weights Tobacco use. Former smoker. former smoker; smoke(d) less than 1/2 pack(s) per day Number of flights of stairs before winded. 1 Drug/Alcohol Rehab (Previously). no Drug/Alcohol Rehab (Currently). no Current work status. working part time Children. 5 or more Pain Contract. no Advance Directives. Living Will, Healthcare POA Post-Surgical Plans.  Home    Medication History ClonazePAM (0.5MG  Tablet, Oral) Active. (qhs)  Cyclobenzaprine HCl (10MG  Tablet, Oral) Active. (qhs) Cymbalta (60MG  Capsule DR Part, Oral) Active. Gabapentin (800MG  Tablet, Oral) Active. (1 in am, 1 at night) Levothyroxine Sodium (25MCG Tablet, Oral) Active. (1 qd) Primidone (50MG  Tablet, Oral) Active. (qhs) ProAir HFA (108 (90 Base)MCG/ACT Aerosol Soln, Inhalation) Active. (prn) Ketorolac Tromethamine (10MG  Tablet, Oral) Active. (prn) Januvia (100MG  Tablet, Oral) Active. (qhs) Zofran (4MG  Tablet, Oral) Active. Hydrocodone-Acetaminophen (5-325MG  Tablet, Oral) Active. Maxalt ( Oral) Specific dose unknown - Active.   Past Surgical History Total Knee Replacement. left Colon Polyp Removal - Colonoscopy Arthroscopy of Knee. right Carpal Tunnel Repair. bilateral Thyroidectomy; Total Appendectomy Gallbladder Surgery. laporoscopic Hysterectomy. complete (non-cancerous) Cataract Surgery. bilateral Larynx Surgery. Date: 12/2013.    Vitals Weight: 230 lb Height: 66 in Weight was reported by patient. Height was reported by patient. Body Surface Area: 2.2 m Body Mass Index: 37.12 kg/m Pulse: 96 (Regular) Resp.: 14 (Unlabored) BP: 122/88 (Sitting, Left Arm, Standard)     Physical Exam The physical exam findings are as follows:   General Mental Status - Alert, cooperative and good historian. General Appearance- pleasant. Not in acute distress. Orientation- Oriented X3. Build & Nutrition- Well nourished and Well developed.   Head and Neck Head- normocephalic, atraumatic . Neck Global Assessment- supple. no bruit auscultated on the right and no bruit auscultated on the left.   Eye Vision- Wears corrective lenses (readers). Pupil- Bilateral- Regular and Round. Motion- Bilateral- EOMI.   Chest and Lung Exam Auscultation: Breath sounds:- clear at anterior chest wall and - clear at posterior chest  wall. Adventitious sounds:- No Adventitious sounds.   Cardiovascular Auscultation:Rhythm- Regular rate and rhythm. Heart Sounds- S1 WNL and S2 WNL. Murmurs & Other Heart Sounds:Auscultation of the heart reveals - No Murmurs.   Abdomen Palpation/Percussion:Tenderness- Abdomen is non-tender to palpation. Rigidity (guarding)- Abdomen is soft. Auscultation:Auscultation of the abdomen reveals - Bowel sounds normal.   Female Genitourinary   Note: Not done, not pertinent to present illness  Musculoskeletal   Note: On exam she is alert and oriented in no apparent distress.  The right knee shows no effusion today. There is moderate crepitus on range of motion of the right knee. She is nontender. There is no instability.  Left knee shows trace effusion, no warmth. Range is about 3 degrees of hyperextension to about 115 to 120 flexion. She has a significant amount of AP laxity in flexion and also has a varus valgus laxity in extension.   Assessment & Plan Primary osteoarthritis of one knee (715.16  M17.10) Impression: Right Knee  Status post total left knee replacement (V43.65  W38.937) Instability of internal left knee prosthesis (996.42  T84.023A)  Note: Plan is for a Left Knee poly Exchange versus Revision Left Total Knee Replacement by Dr. Wynelle Link.  Plan is to go home.  PCP - Dr. Tommy Medal  The patient will not receive TXA (tranexamic acid) due to: Pulmonary Embolus  Please note that the patient states she has a tendency to have her pusle ox run from the low 90's into the 80's.  Signed electronically by Joelene Millin, III PA-C

## 2014-02-24 NOTE — Progress Notes (Signed)
Pt placed on Auto CPAP 5-12 CMH20 with 4 LPM O2 bleed in.  Pt tolerating well at this time, RT to monitor and assess as needed.

## 2014-02-24 NOTE — Progress Notes (Signed)
RT setup CPAP for Pt to use tonight, Pt brought home mask and tubing but didn't know home settings.  RT placed machine on Auto CPAP 5-12 CMH2O with 4 LPM O2 bleed in.  Pt will call when ready for sleep, RT to monitor and assess as needed.

## 2014-02-25 ENCOUNTER — Encounter (HOSPITAL_COMMUNITY): Payer: Self-pay | Admitting: Orthopedic Surgery

## 2014-02-25 LAB — CBC
HCT: 33.7 % — ABNORMAL LOW (ref 36.0–46.0)
Hemoglobin: 10.7 g/dL — ABNORMAL LOW (ref 12.0–15.0)
MCH: 28.2 pg (ref 26.0–34.0)
MCHC: 31.8 g/dL (ref 30.0–36.0)
MCV: 88.7 fL (ref 78.0–100.0)
Platelets: 254 10*3/uL (ref 150–400)
RBC: 3.8 MIL/uL — ABNORMAL LOW (ref 3.87–5.11)
RDW: 13.9 % (ref 11.5–15.5)
WBC: 12.2 10*3/uL — ABNORMAL HIGH (ref 4.0–10.5)

## 2014-02-25 LAB — GLUCOSE, CAPILLARY
Glucose-Capillary: 153 mg/dL — ABNORMAL HIGH (ref 70–99)
Glucose-Capillary: 184 mg/dL — ABNORMAL HIGH (ref 70–99)
Glucose-Capillary: 200 mg/dL — ABNORMAL HIGH (ref 70–99)

## 2014-02-25 LAB — BASIC METABOLIC PANEL
BUN: 8 mg/dL (ref 6–23)
CO2: 29 mEq/L (ref 19–32)
Calcium: 9.1 mg/dL (ref 8.4–10.5)
Chloride: 101 mEq/L (ref 96–112)
Creatinine, Ser: 0.57 mg/dL (ref 0.50–1.10)
GFR calc Af Amer: >90 mL/min (ref >90)
GFR calc non Af Amer: >90 mL/min (ref >90)
Glucose, Bld: 166 mg/dL — ABNORMAL HIGH (ref 70–99)
Potassium: 4.7 mEq/L (ref 3.7–5.3)
Sodium: 139 mEq/L (ref 137–147)

## 2014-02-25 MED ORDER — HYDROMORPHONE HCL 2 MG PO TABS: 2.0000 mg | ORAL_TABLET | ORAL | Status: DC | PRN

## 2014-02-25 MED ORDER — DSS 100 MG PO CAPS
100.0000 mg | ORAL_CAPSULE | Freq: Two times a day (BID) | ORAL | Status: DC
Start: 2014-02-25 — End: 2014-05-15

## 2014-02-25 MED ORDER — RIVAROXABAN 10 MG PO TABS: 10.0000 mg | ORAL_TABLET | Freq: Every day | ORAL | Status: DC

## 2014-02-25 MED ORDER — METHOCARBAMOL 500 MG PO TABS: 500.0000 mg | ORAL_TABLET | Freq: Four times a day (QID) | ORAL | Status: DC | PRN

## 2014-02-25 MED ORDER — HYDROMORPHONE HCL 2 MG PO TABS
2.0000 mg | ORAL_TABLET | ORAL | Status: DC | PRN
  Administered 2014-02-25 – 2014-02-27 (×9): 4 mg via ORAL
  Filled 2014-02-25 (×9): qty 2

## 2014-02-25 NOTE — Progress Notes (Signed)
RT placed pt on Auto CPAP 5-12 CMH20 with 5 LPM O2 bleed in via FFM.  Pt tolerating well at this time, RT to monitor and assess as needed.

## 2014-02-25 NOTE — Progress Notes (Signed)
Clinical Social Work Department BRIEF PSYCHOSOCIAL ASSESSMENT 02/25/2014  Patient:  Hayley [name], Hayley [name]     Account Number:  [id number]     Admit date:  02/24/2014  Clinical Social Worker:  Lacie Scotts  Date/Time:  02/25/2014 01:33 PM  Referred by:  Physician  Date Referred:  02/25/2014 Referred for  SNF Placement   Other Referral:   Interview type:  Patient Other interview type:    PSYCHOSOCIAL DATA Living Status:  HUSBAND Admitted from facility:   Level of care:   Primary support name:  Hayley [name] Primary support relationship to patient:  SPOUSE Degree of support available:   supportive    CURRENT CONCERNS Current Concerns  Post-Acute Placement   Other Concerns:    SOCIAL WORK ASSESSMENT / PLAN Pt is a 68 yr old female living at home with spouse prior to hospitalization. CSW met with pt to assist with d/c planning. This is a planned admission. Pt will need ST Rehab following hospital d/c. Pt is in agreement with this plan. SNF search has been initiated and bed offers will be provided this afternoon.   Assessment/plan status:  Psychosocial Support/Ongoing Assessment of Needs Other assessment/ plan:   Information/referral to community resources:   SNF list provided. Insurance coverage for SNF and ambulance transport reviewed.    PATIENT'S/FAMILY'S RESPONSE TO PLAN OF CARE: Pt is pleased her surgery is completed. She feels a short time in rehab will be helpful. " I don't want to put too much strain on my husband." Pt's mood is bright and she is motivated to work with therapist.    Werner Lean LCSW [phone]

## 2014-02-25 NOTE — Progress Notes (Signed)
Clinical Social Work Department CLINICAL SOCIAL WORK PLACEMENT NOTE 02/25/2014  Patient:  MERRILL, DEANDA  Account Number:  [id number] Admit date:  02/24/2014  Clinical Social Worker:  Werner Lean, LCSW  Date/time:  02/25/2014 01:42 PM  Clinical Social Work is seeking post-discharge placement for this patient at the following level of care:   SKILLED NURSING   (*CSW will update this form in Epic as items are completed)   02/25/2014  Patient/family provided with [facility] of Clinical Social Work's list of facilities offering this level of care within the geographic area requested by the patient (or if unable, by the patient's family).  02/25/2014  Patient/family informed of their freedom to choose among providers that offer the needed level of care, that participate in Medicare, Medicaid or managed care program needed by the patient, have an available bed and are willing to accept the patient.    Patient/family informed of [facility]' ownership interest in [facility], as well as of the fact that they are under no obligation to receive care at this facility.  PASARR submitted to EDS on 02/25/2014 PASARR number received on 02/25/2014  FL2 transmitted to all facilities in geographic area requested by pt/family on  02/25/2014 FL2 transmitted to all facilities within larger geographic area on   Patient informed that his/her managed care company has contracts with or will negotiate with  certain facilities, including the following:     Patient/family informed of bed offers received:   Patient chooses bed at  Physician recommends and patient chooses bed at    Patient to be transferred to  on   Patient to be transferred to facility by  Patient and family notified of transfer on  Name of family member notified:    The following physician request were entered in Epic:   Additional Comments:  Werner Lean LCSW [phone]

## 2014-02-25 NOTE — Op Note (Signed)
Hayley, [name]                  ACCOUNT NO.:  [id number]  MEDICAL RECORD NO.:  [id number]  LOCATION:  38                         FACILITY:  [facility]  PHYSICIAN:  Gaynelle Arabian, M.D.    DATE OF BIRTH:  Nov 17, 1945  DATE OF PROCEDURE:  02/24/2014 DATE OF DISCHARGE:                              OPERATIVE REPORT   PREOPERATIVE DIAGNOSIS:  Unstable loose left total knee arthroplasty.  POSTOPERATIVE DIAGNOSIS:  Unstable loose left total knee arthroplasty.  PROCEDURE:  Left total knee arthroplasty revision.  SURGEON:  Gaynelle Arabian, M.D.  ASSISTANT:  Molli Barrows, PA-C.  ANESTHESIA:  Spinal.  ESTIMATED BLOOD LOSS:  Minimal.  DRAINS:  Hemovac x1.  TOURNIQUET TIME:  Up 36 minutes at 300 mmHg, down 9 minutes and up additional 19 minutes at 300 mmHg.  COMPLICATIONS:  None.  CONDITION:  Stable to recovery.  BRIEF CLINICAL NOTE:  Hayley [name] is a 68 year old female, had a left total knee arthroplasty done a few years ago elsewhere.  She has had persistent pain, swelling, and instability.  Infection workup preoperatively was negative.  Bone scan was significant for increased uptake around the tibial component.  She presents now for left knee polyethylene versus total knee arthroplasty revision.  PROCEDURE IN DETAIL:  After successful administration of spinal anesthetic, a tourniquet is placed high on her left thigh and her left lower extremity is prepped and draped in the usual sterile fashion. Exam under anesthesia showed gross instability and flexion to anterior- posterior stress as well as instability to varus valgus stress and extension.  The extremities wrapped in Esmarch.  Tourniquet was inflated to 300 mmHg.  A midline incision was made with a 10 blade through subcutaneous tissue to the level of the extensor mechanism.  Fresh blade was used to make a medial parapatellar arthrotomy.  There is some fluid in the joint which appears clear.  It was sent for stat Gram stain  which was negative.  A thorough synovectomy was performed and there is a massive amount of polyethylene wear debris in the joint.  The thorough synovectomy is performed, then the soft tissue over the proximal medial tibia subperiosteally elevated to the joint line with a knife and into the semimembranosus bursa with a Cobb elevator.  Soft tissue laterally was elevated with attention being paid to avoiding the patellar tendon on tibial tubercle.  I was able to evert the patella and the knee flexed 90 degrees.  I was able to dislocate the tibia from the femur.  We removed the tibial polyethylene.  There was evidence that the tibia was grossly loose.  Has subsided down into the tibia as well as having gross motion present.  It was removed and then the cement removed. Extramedullary tibial alignment guide was placed referencing proximally at the medial aspect of the tibial tubercle and distally along the second metatarsal axis and tibial crest.  Block is pinned in this position.  It is pinned to remove 2 mm off the cut bone surface.  The retractors were placed circumferentially.  Resection was then made with an oscillating saw.  Size 2.5 is the most appropriate tibial component. We did our proximal preparation for MBT  revision tray.  I also reamed up to 13 mm in the canal for placement of 13 mm cemented stem.  Proximally I also prepared with a 29 mm sleeve.  The trial components, a size 2.5 MBT revision tray with 5 mm augments medial and lateral, a 29 mm sleeve and a 13 x 30 stem extension.  We then removed the femoral component by disrupting the interface between the component and bone.  It came off easily.  The cement was removed.  We created a starting hole in the distal femur and thoroughly irrigated the canal.  The intramedullary reaming was performed up to 18 mm.  It had excellent press fit.  We left that reamer placed to serve as our intramedullary cutting guide.  Distal femoral  cutting block was placed and we removed 2 mm off each side medial and lateral, and decided to use a 4 mm distal augment to get back to better bone.  The size 3 is the most appropriate femoral component.  The anterior-posterior cutting block was then placed and a spacer block placed to create a rectangular flexion gap.  Rotation corresponded with the rotation of the epicondylar axis.  Block was pinned and the anterior posterior and chamfer cuts made.  Minimal bone was resected on any of these cuts.  We had to go into the +4 position in order to get any bone posteriorly, thus we needed 4 mm medial lateral and posterior augments.  Intercondylar blocks placed and the intercondylar cut made with TC3.  The trial femur was then placed with the TC3 component size 3, 4 mm medial lateral distal augments, 4 mm medial lateral posterior augments, and 18 x 75 stem extension in a +2 position and 5 degrees of left valgus.  It had excellent fit on the femur.  Tibial trial was also placed.  The 20 mm insert showed excellent stability with full extension and excellent varus valgus anterior-posterior balance throughout full range of motion.  I decided for the final implant to use 10 mm augments on the tibia instead of 5 plus we would not need such a thick insert.  We then released the tourniquet for total time of 36 minutes.  Minor bleeding was stopped with cautery.  Tourniquet was now for 9 minutes while the components were assembled on the back table.  Components are as follows.  On the tibial side, there is a size 2.5 MBT revision tray with a 10 mm medial and lateral augments, 29 mm sleeve, and 13 x 30 stem extension, and on the femoral side the  components is size 3 TC3 femur with a 18 x 75 stem extension in a +2 5 degree valgus position with a 4 mm medial and lateral distal augment, 4 mm medial and lateral posterior augments.  Once the components were assembled, then the leg was rewrapped in Esmarch  and tourniquet inflated to 300 mmHg.  We trialed cement restrictor on the tibia which was size 4.  Size 4 cement restrictor was placed at the appropriate depth in the tibial canal.  The cut bone surfaces were then thoroughly irrigated with pulsatile lavage. Cement was then mixed and once ready for implantation, the tibial components cemented into position and impacted.  All extruded cement removed.  Cement was done on the stem and the cut bone surface.  On the femoral side, we just cemented distally and had a press-fit stem.  That is also impacted and a 15 mm trial inserts placed, knee held in full  extension.  All extruded cement removed.  When the cement fully hardened, then the permanent 15 mm posterior stabilized rotating platform TC3 insert was placed into the tibial tray.  For the patella, I did a patelloplasty removing the abnormal soft tissue off the undersurface of the patella and the components in excellent position. It is not worn at all, thus I left it intact.  It tracks normally.  The wound was again copiously irrigated with saline solution, and the Exparel injected which is 20 mL of Exparel mixed with 30 mL of saline injected into the arthrotomy extensor mechanism, periosteum of the femur and the subcu tissues.  Additional 20 mL of 0.25% Marcaine was injected into the subcu and same tissues.  The arthrotomy was then closed over Hemovac drain with running #1 V-Loc suture.  Flexion against gravity is 135 degrees.  Tourniquet released for total tourniquet time of 19 minutes with the 2nd tourniquet time was 36 minutes and 19 for total of 55.  The subcutaneous was then closed with interrupted 2-0 Vicryl and subcuticular running 4-0 Monocryl.  We had a drain in the deep tissue and also drain the subcu and these are hooked up to suction.  Incisions cleaned and dried and Steri-Strips and a bulky sterile dressing applied. She was then awakened and transported to recovery in stable  condition. Note that the surgical assistant was a medical necessity for this procedure to perform in a safe and expeditious manner.  Surgical assistant was necessary for protection of vital neurovascular structures, vital ligaments, and also for safe removal of the old prosthesis and safe placement in the new prosthesis.     Gaynelle Arabian, M.D.     FA/MEDQ  D:  02/24/2014  T:  02/24/2014  Job:  [id number]

## 2014-02-25 NOTE — Progress Notes (Signed)
Pt wants RT to come back between 2230 and 2300 to place her on CPAP.  RT to monitor and assess as needed.

## 2014-02-25 NOTE — Evaluation (Signed)
Physical Therapy Evaluation Patient Details Name: Hayley [name] MRN: [id number] DOB: 1945-11-10 Today's Date: 02/25/2014   History of Present Illness  L TKA revision  Clinical Impression  Pt will benefit from PT in acute venue to address deficits below; she may need SNF depending on progress, pt feels she may need ST rehab as well;  Will continue to assess    Follow Up Recommendations SNF;Home health PT (vs depending on progress)    Equipment Recommendations  None recommended by PT    Recommendations for Other Services       Precautions / Restrictions Precautions Precautions: Knee Required Braces or Orthoses: Knee Immobilizer - Left Knee Immobilizer - Left: Discontinue once straight leg raise with < 10 degree lag Restrictions Other Position/Activity Restrictions: WBAT LLE      Mobility  Bed Mobility Overal bed mobility: Needs Assistance Bed Mobility: Supine to Sit     Supine to sit: Min assist     General bed mobility comments: min for LLE, cues for self assist  Transfers Overall transfer level: Needs assistance Equipment used: Rolling walker (2 wheeled) Transfers: Sit to/from Stand Sit to Stand: Min assist         General transfer comment: cues for hand placement and LLE position  Ambulation/Gait Ambulation/Gait assistance: Min assist   Assistive device: Rolling walker (2 wheeled) Gait Pattern/deviations: Trunk flexed;Step-to pattern;Decreased step length - right;Decreased step length - left     General Gait Details: cues for RW position, posture and breathing  Stairs            Wheelchair Mobility    Modified Rankin (Stroke Patients Only)       Balance                                             Pertinent Vitals/Pain Pt on 5L O2 at rest and sats 89% 86% on RA with acvtivity 89% once O2 replaced;   HR 101-133 RN aware     Home Living Family/patient expects to be discharged to:: Unsure Living Arrangements:  Spouse/significant other   Type of Home: House Home Access: Stairs to enter Entrance Stairs-Rails: Left Entrance Stairs-Number of Steps: back-7, front-4 Home Layout: Multi-level;Able to live on main level with bedroom/bathroom Home Equipment: Gilford Rile - 2 wheels;Shower seat Additional Comments: shower seat too wide    Prior Function                 Hand Dominance        Extremity/Trunk Assessment   Upper Extremity Assessment: Defer to OT evaluation           Lower Extremity Assessment: LLE deficits/detail   LLE Deficits / Details: able to  do I SLR; ankle WFL     Communication   Communication: No difficulties  Cognition Arousal/Alertness: Awake/alert Behavior During Therapy: WFL for tasks assessed/performed Overall Cognitive Status: Within Functional Limits for tasks assessed                      General Comments      Exercises Total Joint Exercises Ankle Circles/Pumps: AROM;Both;10 reps Quad Sets: Both;5 reps;Strengthening      Assessment/Plan    PT Assessment Patient needs continued PT services  PT Diagnosis Difficulty walking   PT Problem List Decreased strength;Decreased range of motion;Decreased activity tolerance;Decreased mobility;Decreased knowledge of use of DME;Decreased knowledge of precautions  PT Treatment Interventions DME instruction;Gait training;Functional mobility training;Therapeutic activities;Therapeutic exercise;Patient/family education;Stair training   PT Goals (Current goals can be found in the Care Plan section) Acute Rehab PT Goals PT Goal Formulation: With patient Time For Goal Achievement: 03/04/14 Potential to Achieve Goals: Good    Frequency 7X/week   Barriers to discharge        Co-evaluation               End of Session Equipment Utilized During Treatment: Gait belt Activity Tolerance: Treatment limited secondary to medical complications (Comment) (incr HR and decr O2) Patient left: in chair;with  call bell/phone within reach           Time: 1009-1038 PT Time Calculation (min): 29 min   Charges:   PT Evaluation $Initial PT Evaluation Tier I: 1 Procedure PT Treatments $Gait Training: 23-37 mins   PT G Codes:          Kinzley Savell March 20, 2014, 11:14 AM

## 2014-02-25 NOTE — Discharge Instructions (Addendum)
° °Dr. Tynasia Mccaul °Total Joint Specialist °[facility] °[address] °[place], [place] [address] °[phone] ° °TOTAL KNEE REPLACEMENT POSTOPERATIVE DIRECTIONS ° ° ° °Knee Rehabilitation, Guidelines Following Surgery  °Results after knee surgery are often greatly improved when you follow the exercise, range of motion and muscle strengthening exercises prescribed by your doctor. Safety measures are also important to protect the knee from further injury. Any time any of these exercises cause you to have increased pain or swelling in your knee joint, decrease the amount until you are comfortable again and slowly increase them. If you have problems or questions, call your caregiver or physical therapist for advice.  ° °HOME CARE INSTRUCTIONS  °Remove items at home which could result in a fall. This includes throw rugs or furniture in walking pathways.  °Continue medications as instructed at time of discharge. °You may have some home medications which will be placed on hold until you complete the course of blood thinner medication.  °You may start showering once you are discharged home but do not submerge the incision under water. Just pat the incision dry and apply a dry gauze dressing on daily. °Walk with walker as instructed.  °You may resume a sexual relationship in one month or when given the OK by  your doctor.  °· Use walker as long as suggested by your caregivers. °· Avoid periods of inactivity such as sitting longer than an hour when not asleep. This helps prevent blood clots.  °You may put full weight on your legs and walk as much as is comfortable.  °You may return to work once you are cleared by your doctor.  °Do not drive a car for 6 weeks or until released by you surgeon.  °· Do not drive while taking narcotics.  °Wear the elastic stockings for three weeks following surgery during the day but you may remove then at night. °Make sure you keep all of your appointments after your  operation with all of your doctors and caregivers. You should call the office at the above phone number and make an appointment for approximately two weeks after the date of your surgery. °Change the dressing daily and reapply a dry dressing each time. °Please pick up a stool softener and laxative for home use as long as you are requiring pain medications. °· Continue to use ice on the knee for pain and swelling from surgery. You may notice swelling that will progress down to the foot and ankle.  This is normal after surgery.  Elevate the leg when you are not up walking on it.   °It is important for you to complete the blood thinner medication as prescribed by your doctor. °· Continue to use the breathing machine which will help keep your temperature down.  It is common for your temperature to cycle up and down following surgery, especially at night when you are not up moving around and exerting yourself.  The breathing machine keeps your lungs expanded and your temperature down. ° °RANGE OF MOTION AND STRENGTHENING EXERCISES  °Rehabilitation of the knee is important following a knee injury or an operation. After just a few days of immobilization, the muscles of the thigh which control the knee become weakened and shrink (atrophy). Knee exercises are designed to build up the tone and strength of the thigh muscles and to improve knee motion. Often times heat used for twenty to thirty minutes before working out will loosen up your tissues and help with improving the   range of motion but do not use heat for the first two weeks following surgery. These exercises can be done on a training (exercise) mat, on the floor, on a table or on a bed. Use what ever works the best and is most comfortable for you Knee exercises include:  °Leg Lifts - While your knee is still immobilized in a splint or cast, you can do straight leg raises. Lift the leg to 60 degrees, hold for 3 sec, and slowly lower the leg. Repeat 10-20 times 2-3  times daily. Perform this exercise against resistance later as your knee gets better.  °Quad and Hamstring Sets - Tighten up the muscle on the front of the thigh (Quad) and hold for 5-10 sec. Repeat this 10-20 times hourly. Hamstring sets are done by pushing the foot backward against an object and holding for 5-10 sec. Repeat as with quad sets.  °A rehabilitation program following serious knee injuries can speed recovery and prevent re-injury in the future due to weakened muscles. Contact your doctor or a physical therapist for more information on knee rehabilitation.  ° °SKILLED REHAB INSTRUCTIONS: °If the patient is transferred to a skilled rehab facility following release from the hospital, a list of the current medications will be sent to the facility for the patient to continue.  When discharged from the skilled rehab facility, please have the facility set up the patient's Home Health Physical Therapy prior to being released. Also, the skilled facility will be responsible for providing the patient with their medications at time of release from the facility to include their pain medication, the muscle relaxants, and their blood thinner medication. If the patient is still at the rehab facility at time of the two week follow up appointment, the skilled rehab facility will also need to assist the patient in arranging follow up appointment in our office and any transportation needs. ° °MAKE SURE YOU:  °Understand these instructions.  °Will watch your condition.  °Will get help right away if you are not doing well or get worse.  ° ° °Pick up stool softner and laxative for home. °Do not submerge incision under water. °May shower. °Continue to use ice for pain and swelling from surgery. ° °======================================================= ° ° °Information on my medicine - XARELTO® (Rivaroxaban) ° °This medication education was reviewed with me or my healthcare representative as part of my discharge preparation.   The pharmacist that spoke with me during my hospital stay was:  Absher, Randall K, RPH ° °Why was Xarelto® prescribed for you? °Xarelto® was prescribed for you to reduce the risk of blood clots forming after orthopedic surgery. The medical term for these abnormal blood clots is venous thromboembolism (VTE). ° °What do you need to know about xarelto® ? °Take your Xarelto® ONCE DAILY at the same time every day. °You may take it either with or without food. ° °If you have difficulty swallowing the tablet whole, you may crush it and mix in applesauce just prior to taking your dose. ° °Take Xarelto® exactly as prescribed by your doctor and DO NOT stop taking Xarelto® without talking to the doctor who prescribed the medication.  Stopping without other VTE prevention medication to take the place of Xarelto® may increase your risk of developing a clot. ° °After discharge, you should have regular check-up appointments with your healthcare provider that is prescribing your Xarelto®.   ° °What do you do if you miss a dose? °If you miss a dose, take it as soon as   you remember on the same day then continue your regularly scheduled once daily regimen the next day. Do not take two doses of Xarelto® on the same day.  ° °Important Safety Information °A possible side effect of Xarelto® is bleeding. You should call your healthcare provider right away if you experience any of the following: °  Bleeding from an injury or your nose that does not stop. °  Unusual colored urine (red or dark brown) or unusual colored stools (red or black). °  Unusual bruising for unknown reasons. °  A serious fall or if you hit your head (even if there is no bleeding). ° °Some medicines may interact with Xarelto® and might increase your risk of bleeding while on Xarelto®. To help avoid this, consult your healthcare provider or pharmacist prior to using any new prescription or non-prescription medications, including herbals, vitamins, non-steroidal  anti-inflammatory drugs (NSAIDs) and supplements. ° °This website has more information on Xarelto®: www.xarelto.com. ° ° ° ° ° ° ° ° °

## 2014-02-25 NOTE — Evaluation (Signed)
Occupational Therapy Evaluation Patient Details Name: CLYDINE PARKISON MRN: [id number] DOB: 29-Nov-1945 Today's Date: 02/25/2014    History of Present Illness L TKA revision   Clinical Impression   This 68 year old female was admitted for the above surgery.  Her original TKA was 2 years ago, and she still has DME.  She was independent with adls prior to admission and needs min A currently. She is alone most of the day and would benefit from SNF.  Will follow in acute with supervision level goals.    Follow Up Recommendations  SNF    Equipment Recommendations  None recommended by OT    Recommendations for Other Services       Precautions / Restrictions Precautions Precautions: Knee Required Braces or Orthoses: Knee Immobilizer - Left Knee Immobilizer - Left: Discontinue once straight leg raise with < 10 degree lag Restrictions Other Position/Activity Restrictions: WBAT LLE      Mobility Bed Mobility Overal bed mobility: Needs Assistance Bed Mobility: Supine to Sit     Supine to sit: Min assist     General bed mobility comments: min for LLE, cues for self assist  Transfers Overall transfer level: Needs assistance Equipment used: Rolling walker (2 wheeled) Transfers: Sit to/from Stand Sit to Stand: Min assist         General transfer comment: cues for hand placement and LLE position    Balance                                            ADL Overall ADL's : Needs assistance/impaired     Grooming: Wash/dry hands;Supervision/safety;Standing       Lower Body Bathing: Minimal assistance;Sit to/from stand       Lower Body Dressing: Minimal assistance;Sit to/from stand   Toilet Transfer: Minimal assistance;Ambulation;BSC   Toileting- Water quality scientist and Hygiene: Min guard;Sit to/from stand         General ADL Comments: ambulated to bathroom with min A.  Able to reach to feet but needs a little assist for LB adls.  Pt very motivated      Environmental education officer      Pertinent Vitals/Pain 5/10 L knee; R knee also painful.  Repositioned with ice and pt requested pain medication     Hand Dominance     Extremity/Trunk Assessment Upper Extremity Assessment Upper Extremity Assessment: Overall WFL for tasks assessed          Communication Communication Communication: No difficulties   Cognition Arousal/Alertness: Awake/alert Behavior During Therapy: WFL for tasks assessed/performed Overall Cognitive Status: Within Functional Limits for tasks assessed                     General Comments       Exercises       Shoulder Instructions      Home Living Family/patient expects to be discharged to:: Unsure Living Arrangements: Spouse/significant other   Type of Home: House Home Access: Stairs to enter [employer] of Steps: back-7, front-4 Entrance Stairs-Rails: Left Home Layout: Multi-level;Able to live on main level with bedroom/bathroom               Home Equipment: Gilford Rile - 2 wheels;Shower seat   Additional Comments: lives  with spouse who transports pts to dialysis during day:  home for lunch.  she has a tub with seat and 3:1 commode      Prior Functioning/Environment Level of Independence: Independent             OT Diagnosis: Generalized weakness   OT Problem List: Decreased strength;Decreased activity tolerance;Pain   OT Treatment/Interventions: Self-care/ADL training;DME and/or AE instruction;Patient/family education    OT Goals(Current goals can be found in the care plan section) Acute Rehab OT Goals Patient Stated Goal: get back to my garden OT Goal Formulation: With patient Time For Goal Achievement: 03/04/14 Potential to Achieve Goals: Good ADL Goals Pt Will Perform Lower Body Bathing: with supervision;sit to/from stand Pt Will Perform Lower Body Dressing: with supervision;sit to/from stand Pt Will Transfer to Toilet:  with supervision;ambulating;bedside commode Pt Will Perform Toileting - Clothing Manipulation and hygiene: with supervision;sit to/from stand  OT Frequency: Min 2X/week   Barriers to D/C:            Co-evaluation              End of Session    Activity Tolerance: Patient tolerated treatment well Patient left: in bed;with call bell/phone within reach   Time: 6767-2094 OT Time Calculation (min): 28 min Charges:  OT General Charges $OT Visit: 1 Procedure OT Evaluation $Initial OT Evaluation Tier I: 1 Procedure OT Treatments $Self Care/Home Management : 8-22 mins G-Codes:    Saverio Kader March 08, 2014, 12:50 PM  Lesle Chris, OTR/L [phone] 03/08/2014

## 2014-02-25 NOTE — Progress Notes (Signed)
02/25/14 1600  PT Visit Information  Last PT Received On 02/25/14  Assistance Needed +1  History of Present Illness L TKA revision  PT Time Calculation  PT Start Time 1505  PT Stop Time 1524  PT Time Calculation (min) 19 min  Subjective Data  Patient Stated Goal get back to my garden  Precautions  Precautions Knee  Required Braces or Orthoses Knee Immobilizer - Left  Restrictions  Other Position/Activity Restrictions WBAT LLE  Cognition  Arousal/Alertness Awake/alert  Behavior During Therapy WFL for tasks assessed/performed  Overall Cognitive Status Within Functional Limits for tasks assessed  Total Joint Exercises  Ankle Circles/Pumps AROM;Both;10 reps  Quad Sets AROM;Left;10 reps  Heel Slides AROM;Left;10 reps;AAROM  Hip ABduction/ADduction AROM;AAROM;10 reps;Left  Straight Leg Raises AROM;AAROM;Strengthening;Left;10 reps  PT - End of Session  Activity Tolerance Patient tolerated treatment well  Patient left in bed;with call bell/phone within reach  Nurse Communication Mobility status  PT - Assessment/Plan  PT Plan Current plan remains appropriate  PT Frequency 7X/week  Follow Up Recommendations SNF  PT equipment None recommended by PT  PT Goal Progression  Progress towards PT goals Progressing toward goals  Acute Rehab PT Goals  PT Goal Formulation With patient  Time For Goal Achievement 03/04/14  Potential to Achieve Goals Good  PT General Charges  $$ ACUTE PT VISIT 1 Procedure  PT Treatments  $Therapeutic Exercise 8-22 mins

## 2014-02-25 NOTE — Progress Notes (Addendum)
   Subjective: 1 Day Post-Op Procedure(s) (LRB): LEFT KNEE POLYETHENE VS TOTAL KNEE REVISION (Left) Patient reports pain as mild.   We will start therapy today.  Patient complains of itching Plan is to go Skilled nursing facility after hospital stay.  Objective: Vital signs in last 24 hours: Temp:  [97 F (36.1 C)-98.5 F (36.9 C)] 98.5 F (36.9 C) (06/11 0553) Pulse Rate:  [80-101] 80 (06/11 0553) Resp:  [11-18] 14 (06/11 0553) BP: (94-122)/(42-78) 94/61 mmHg (06/11 0553) SpO2:  [84 %-93 %] 84 % (06/11 0553) FiO2 (%):  [89 %] 89 % (06/10 1500) Weight:  [224 lb (101.606 kg)] 224 lb (101.606 kg) (06/10 1500)  Intake/Output from previous day:  Intake/Output Summary (Last 24 hours) at 02/25/14 0733 Last data filed at 02/25/14 0600  Gross per 24 hour  Intake 2443.75 ml  Output   1405 ml  Net 1038.75 ml    Intake/Output this shift:    Labs:  Recent Labs  02/25/14 0415  HGB 10.7*    Recent Labs  02/25/14 0415  WBC 12.2*  RBC 3.80*  HCT 33.7*  PLT 254    Recent Labs  02/25/14 0415  NA 139  K 4.7  CL 101  CO2 29  BUN 8  CREATININE 0.57  GLUCOSE 166*  CALCIUM 9.1   No results found for this basename: LABPT, INR,  in the last 72 hours  EXAM General - Patient is Alert, Appropriate and Oriented Extremity - Neurologically intact Neurovascular intact No cellulitis present Compartment soft Dressing - dressing C/D/I Motor Function - intact, moving foot and toes well on exam.  Hemovac pulled without difficulty.  Past Medical History  Diagnosis Date  . Hyperthyroidism   . Toxic goiter   . Reflux   . Colon polyps   . Migraines   . 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 Obesity, Class III, BMI 40-49.9 (morbid obesity) 04/27/2010    02-17-14 reports some weight loss- intentional  . Pelvic pain     02-17-14 "states thinks its back related on left groin"  . Anemia   . Clotting disorder 02-17-14    S/p  appendectomy-developed Pulmonary emboli-tx. warfarin-d/c 1 yr ago.  [name] Neuromuscular disorder     hands and throat"spasmodic dysphonia"  . Osteoarthritis     in back(chronic pain)  . Pulmonary emboli     s/p Appendectomy '13 [facility]  . Diabetes mellitus without complication     "borderline"- oral med  . Right knee pain     Assessment/Plan: 1 Day Post-Op Procedure(s) (LRB): LEFT KNEE POLYETHENE VS TOTAL KNEE REVISION (Left) Principal Problem:   Failed total knee, left Active Problems:   Failed total knee arthroplasty   Advance diet Up with therapy D/C IV fluids D/C oxycodone due to itching. Start Dilaudid  DVT Prophylaxis - Xarelto Weight-Bearing as tolerated to left leg D/C O2 and Pulse OX and try on Room Air  Peggye Poon V 02/25/2014, 7:33 AM

## 2014-02-26 LAB — BASIC METABOLIC PANEL
BUN: 10 mg/dL (ref 6–23)
CO2: 30 mEq/L (ref 19–32)
Calcium: 8.8 mg/dL (ref 8.4–10.5)
Chloride: 105 mEq/L (ref 96–112)
Creatinine, Ser: 0.6 mg/dL (ref 0.50–1.10)
GFR calc Af Amer: >90 mL/min (ref >90)
GFR calc non Af Amer: >90 mL/min (ref >90)
Glucose, Bld: 181 mg/dL — ABNORMAL HIGH (ref 70–99)
Potassium: 4.8 mEq/L (ref 3.7–5.3)
Sodium: 142 mEq/L (ref 137–147)

## 2014-02-26 LAB — GLUCOSE, CAPILLARY
Glucose-Capillary: 219 mg/dL — ABNORMAL HIGH (ref 70–99)
Glucose-Capillary: 117 mg/dL — ABNORMAL HIGH (ref 70–99)
Glucose-Capillary: 88 mg/dL (ref 70–99)
Glucose-Capillary: 102 mg/dL — ABNORMAL HIGH (ref 70–99)
Glucose-Capillary: 173 mg/dL — ABNORMAL HIGH (ref 70–99)

## 2014-02-26 LAB — CBC
HCT: 28.9 % — ABNORMAL LOW (ref 36.0–46.0)
Hemoglobin: 9.1 g/dL — ABNORMAL LOW (ref 12.0–15.0)
MCH: 27.7 pg (ref 26.0–34.0)
MCHC: 31.5 g/dL (ref 30.0–36.0)
MCV: 87.8 fL (ref 78.0–100.0)
Platelets: 219 10*3/uL (ref 150–400)
RBC: 3.29 MIL/uL — ABNORMAL LOW (ref 3.87–5.11)
RDW: 14.1 % (ref 11.5–15.5)
WBC: 10.4 10*3/uL (ref 4.0–10.5)

## 2014-02-26 MED ORDER — GUAIFENESIN-DM 100-10 MG/5ML PO SYRP
5.0000 mL | ORAL_SOLUTION | ORAL | Status: DC | PRN
  Administered 2014-02-26 (×2): 5 mL via ORAL
  Filled 2014-02-26 (×2): qty 10

## 2014-02-26 MED ORDER — PANTOPRAZOLE SODIUM 40 MG PO TBEC: 40.0000 mg | DELAYED_RELEASE_TABLET | Freq: Every day | ORAL | Status: DC

## 2014-02-26 MED ORDER — CLONAZEPAM 0.5 MG PO TABS
0.5000 mg | ORAL_TABLET | Freq: Two times a day (BID) | ORAL | Status: DC | PRN
  Administered 2014-02-26: 0.5 mg via ORAL
  Filled 2014-02-26: qty 1

## 2014-02-26 NOTE — Progress Notes (Signed)
CSW assisting with d/c planning. Tentative d/c summary sent to [facility] for possible SAT d/c. Weekend CSW will assist with d/c planning to SNF if pt is ready for d/c.  Werner Lean LCSW [phone]

## 2014-02-26 NOTE — Progress Notes (Addendum)
   Subjective: 2 Days Post-Op Procedure(s) (LRB): LEFT TOTAL KNEE REVISION (Left) Patient reports pain as mild.   Patient seen in rounds with Dr. Wynelle Link. Patient is well, and has had no acute complaints or problems. No issues overnight. Reports that therapy is going ok. Able to get some rest last night.  Plan is to go Skilled nursing facility after hospital stay.  Objective: Vital signs in last 24 hours: Temp:  [98.1 F (36.7 C)-98.7 F (37.1 C)] 98.3 F (36.8 C) (06/12 7619) Pulse Rate:  [70-96] 70 (06/12 0614) Resp:  [16-17] 16 (06/12 0614) BP: (103-110)/(63-66) 109/63 mmHg (06/12 0614) SpO2:  [89 %-98 %] 97 % (06/12 0614)  Intake/Output from previous day:  Intake/Output Summary (Last 24 hours) at 02/26/14 0730 Last data filed at 02/25/14 2150  Gross per 24 hour  Intake    720 ml  Output   1320 ml  Net   -600 ml     Labs:  Recent Labs  02/25/14 0415 02/26/14 0405  HGB 10.7* 9.1*    Recent Labs  02/25/14 0415 02/26/14 0405  WBC 12.2* 10.4  RBC 3.80* 3.29*  HCT 33.7* 28.9*  PLT 254 219    Recent Labs  02/25/14 0415 02/26/14 0405  NA 139 142  K 4.7 4.8  CL 101 105  CO2 29 30  BUN 8 10  CREATININE 0.57 0.60  GLUCOSE 166* 181*  CALCIUM 9.1 8.8     EXAM General - Patient is Alert and Oriented Extremity - Neurologically intact Intact pulses distally Dorsiflexion/Plantar flexion intact Compartment soft Dressing/Incision - clean, dry, no drainage Motor Function - intact, moving foot and toes well on exam.   Past Medical History  Diagnosis Date  . Hyperthyroidism   . Toxic goiter   . Reflux   . Colon polyps   . Migraines   . 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 Obesity, Class III, BMI 40-49.9 (morbid obesity) 04/27/2010    02-17-14 reports some weight loss- intentional  . Pelvic pain     02-17-14 "states thinks its back related on left groin"  . Anemia   . Clotting disorder 02-17-14    S/p  appendectomy-developed Pulmonary emboli-tx. warfarin-d/c 1 yr ago.  [name] Neuromuscular disorder     hands and throat"spasmodic dysphonia"  . Osteoarthritis     in back(chronic pain)  . Pulmonary emboli     s/p Appendectomy '13 [facility]  . Diabetes mellitus without complication     "borderline"- oral med  . Right knee pain     Assessment/Plan: 2 Days Post-Op Procedure(s) (LRB): LEFT TOTAL KNEE REVISION (Left) Principal Problem:   Failed total knee, left Active Problems:   Failed total knee arthroplasty  Estimated body mass index is 35.62 kg/(m^2) as calculated from the following:   Height as of this encounter: 5' 6.5" (1.689 m).   Weight as of this encounter: 101.606 kg (224 lb). Advance diet Up with therapy D/C IV fluids Discharge to SNF tomorrow  DVT Prophylaxis - Xarelto Weight-Bearing as tolerated   Continue PT. Plan for DC to SNF tomorrow.   Ardeen Jourdain, PA-C Orthopaedic Surgery 02/26/2014, 7:30 AM

## 2014-02-26 NOTE — Progress Notes (Signed)
Physical Therapy Treatment Patient Details Name: AMAR KEENUM MRN: [id number] DOB: March 09, 1946 Today's Date: 08-Mar-2014    History of Present Illness L TKA revision    PT Comments    Pt motivated and progressing well. No c/o SOB this session  Follow Up Recommendations  SNF     Equipment Recommendations  None recommended by PT    Recommendations for Other Services       Precautions / Restrictions Precautions Precautions: Knee Precaution Comments: I SLR today Required Braces or Orthoses: Knee Immobilizer - Left Knee Immobilizer - Left: Discontinue once straight leg raise with < 10 degree lag Restrictions Weight Bearing Restrictions: No Other Position/Activity Restrictions: WBAT LLE    Mobility  Bed Mobility Overal bed mobility: Needs Assistance Bed Mobility: Supine to Sit     Supine to sit: Supervision     General bed mobility comments: min for LLE, cues for self assist  Transfers Overall transfer level: Needs assistance Equipment used: Rolling walker (2 wheeled) Transfers: Sit to/from Stand Sit to Stand: Min guard;Min assist         General transfer comment: cues for hand placement and LLE position  Ambulation/Gait Ambulation/Gait assistance: Min guard;Supervision Ambulation Distance (Feet): 200 Feet (and 18' to bathroom) Assistive device: Rolling walker (2 wheeled) Gait Pattern/deviations: Step-to pattern;Shuffle;Trunk flexed     General Gait Details: cues for posture, position from RW and stride length   Stairs            Wheelchair Mobility    Modified Rankin (Stroke Patients Only)       Balance                                    Cognition Arousal/Alertness: Awake/alert Behavior During Therapy: WFL for tasks assessed/performed Overall Cognitive Status: Within Functional Limits for tasks assessed                      Exercises      General Comments        Pertinent Vitals/Pain 4/10; premed    Home  Living                      Prior Function            PT Goals (current goals can now be found in the care plan section) Acute Rehab PT Goals Patient Stated Goal: get back to my garden PT Goal Formulation: With patient Time For Goal Achievement: 03/04/14 Potential to Achieve Goals: Good Progress towards PT goals: Progressing toward goals    Frequency  7X/week    PT Plan Current plan remains appropriate    Co-evaluation             End of Session Equipment Utilized During Treatment: Gait belt Activity Tolerance: Patient tolerated treatment well Patient left: in bed;with call bell/phone within reach     Time: 1443-1518 PT Time Calculation (min): 35 min  Charges:  $Gait Training: 23-37 mins                    G Codes:      Delfin Squillace Mar 08, 2014, 4:57 PM

## 2014-02-26 NOTE — Discharge Summary (Signed)
Physician Discharge Summary   Patient ID: Hayley [name] MRN: [id number] DOB/AGE: [birth year] 68 y.o.  Admit date: 02/24/2014 Discharge date: 02/27/2014  Primary Diagnosis: Failed total knee arthroplasty, left knee   Admission Diagnoses:  Past Medical History  Diagnosis Date  . Hyperthyroidism   . Toxic goiter   . Reflux   . Colon polyps   . Migraines   . 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 Obesity, Class III, BMI 40-49.9 (morbid obesity) 04/27/2010    02-17-14 reports some weight loss- intentional  . Pelvic pain     02-17-14 "states thinks its back related on left groin"  . Anemia   . Clotting disorder 02-17-14    S/p appendectomy-developed Pulmonary emboli-tx. warfarin-d/c 1 yr ago.  [name] Neuromuscular disorder     hands and throat"spasmodic dysphonia"  . Osteoarthritis     in back(chronic pain)  . Pulmonary emboli     s/p Appendectomy '13 [facility]  . Diabetes mellitus without complication     "borderline"- oral med  . Right knee pain    Discharge Diagnoses:   Principal Problem:   Failed total knee, left Active Problems:   Failed total knee arthroplasty  Estimated body mass index is 35.62 kg/(m^2) as calculated from the following:   Height as of this encounter: 5' 6.5" (1.689 m).   Weight as of this encounter: 101.606 kg (224 lb).  Procedure:  Procedure(s) (LRB): LEFT TOTAL KNEE REVISION (Left)   Consults: None  HPI: The patient presented with the chief complaint of left knee pain with a history of a left total knee arthroplasty. They have been treated conservatively in the past for the above stated problem and despite conservative measures, they continue to have progressive pain and severe functional limitations and dysfunction. They have failed non-operative management including home exercise, medications, bracing and aspiration. It is felt that they would benefit from undergoing poly exchange versus revsion left total joint  replacement. Risks and benefits of the procedure have been discussed with the patient and they elect to proceed with surgery. There are no active contraindications to surgery such as ongoing infection or rapidly progressive neurological disease.  Laboratory Data: Admission on 02/24/2014  Component Date Value Ref Range Status  . ABO/RH(D) 02/24/2014 O POS   Final  . Antibody Screen 02/24/2014 NEG   Final  . Sample Expiration 02/24/2014 02/27/2014   Final  . Glucose-Capillary 02/24/2014 113* 70 - 99 mg/dL Final  . Comment 1 02/24/2014 Notify RN   Final  . ABO/RH(D) 02/24/2014 O POS   Final  . Specimen Description 02/24/2014 FLUID SYNOVIAL LEFT KNEE ON SWAB   Final  . Special Requests 02/24/2014 NONE   Final  . Gram Stain 02/24/2014    Final                   Value:WBC PRESENT, PREDOMINANTLY MONONUCLEAR                         NO ORGANISMS SEEN                         Performed at [facility]  . Culture 02/24/2014    Final                   Value:NO GROWTH 1 DAY  Performed at [facility]  . Report Status 02/24/2014 PENDING   Incomplete  . Specimen Description 02/24/2014 FLUID SYNOVIAL LEFT KNEE   Final  . Special Requests 02/24/2014 NONE   Final  . Gram Stain 02/24/2014    Final                   Value:WBC PRESENT,BOTH PMN AND MONONUCLEAR                         NO ORGANISMS SEEN                         Performed by [facility]                         Performed at [facility]  . Culture 02/24/2014    Final                   Value:NO ANAEROBES ISOLATED; CULTURE IN PROGRESS FOR 5 DAYS                         Note: Gram Stain Report Called to,Read Back By and Verified With: DR.ALUSIO AT 1200 ON 06.10.15 BY SHUEA                         Performed at [facility]  . Report Status 02/24/2014 PENDING   Incomplete  . Specimen Description 02/24/2014 FLUID SYNOVIAL LEFT KNEE ON SWAB   Final  . Special Requests 02/24/2014 NONE    Final  . Gram Stain 02/24/2014    Final                   Value:FEW WBC PRESENT,BOTH PMN AND MONONUCLEAR                         NO ORGANISMS SEEN                         Gram Stain Report Called to,Read Back By and Verified With: DR. Maureen Ralphs AT 1200 ON 06.10.15 BY SHUEA  . Report Status 02/24/2014 02/24/2014 FINAL   Final  . Glucose-Capillary 02/24/2014 163* 70 - 99 mg/dL Final  . WBC 02/25/2014 12.2* 4.0 - 10.5 K/uL Final  . RBC 02/25/2014 3.80* 3.87 - 5.11 MIL/uL Final  . Hemoglobin 02/25/2014 10.7* 12.0 - 15.0 g/dL Final  . HCT 02/25/2014 33.7* 36.0 - 46.0 % Final  . MCV 02/25/2014 88.7  78.0 - 100.0 fL Final  . MCH 02/25/2014 28.2  26.0 - 34.0 pg Final  . MCHC 02/25/2014 31.8  30.0 - 36.0 g/dL Final  . RDW 02/25/2014 13.9  11.5 - 15.5 % Final  . Platelets 02/25/2014 254  150 - 400 K/uL Final  . Sodium 02/25/2014 139  137 - 147 mEq/L Final  . Potassium 02/25/2014 4.7  3.7 - 5.3 mEq/L Final  . Chloride 02/25/2014 101  96 - 112 mEq/L Final  . CO2 02/25/2014 29  19 - 32 mEq/L Final  . Glucose, Bld 02/25/2014 166* 70 - 99 mg/dL Final  . BUN 02/25/2014 8  6 - 23 mg/dL Final  . Creatinine, Ser 02/25/2014 0.57  0.50 - 1.10 mg/dL Final  . Calcium 02/25/2014 9.1  8.4 - 10.5 mg/dL Final  . GFR  calc non Af Amer 02/25/2014 >90  >90 mL/min Final  . GFR calc Af Amer 02/25/2014 >90  >90 mL/min Final   Comment: (NOTE)                          The eGFR has been calculated using the CKD EPI equation.                          This calculation has not been validated in all clinical situations.                          eGFR's persistently <90 mL/min signify possible Chronic Kidney                          Disease.  . Glucose-Capillary 02/24/2014 213* 70 - 99 mg/dL Final  . Comment 1 02/24/2014 Notify RN   Final  . Glucose-Capillary 02/25/2014 153* 70 - 99 mg/dL Final  . Comment 1 02/25/2014 Notify RN   Final  . Comment 2 02/25/2014 Documented in Chart   Final  . Glucose-Capillary 02/25/2014 184* 70  - 99 mg/dL Final  . Comment 1 02/25/2014 Notify RN   Final  . Comment 2 02/25/2014 Documented in Chart   Final  . WBC 02/26/2014 10.4  4.0 - 10.5 K/uL Final  . RBC 02/26/2014 3.29* 3.87 - 5.11 MIL/uL Final  . Hemoglobin 02/26/2014 9.1* 12.0 - 15.0 g/dL Final  . HCT 02/26/2014 28.9* 36.0 - 46.0 % Final  . MCV 02/26/2014 87.8  78.0 - 100.0 fL Final  . MCH 02/26/2014 27.7  26.0 - 34.0 pg Final  . MCHC 02/26/2014 31.5  30.0 - 36.0 g/dL Final  . RDW 02/26/2014 14.1  11.5 - 15.5 % Final  . Platelets 02/26/2014 219  150 - 400 K/uL Final  . Sodium 02/26/2014 142  137 - 147 mEq/L Final  . Potassium 02/26/2014 4.8  3.7 - 5.3 mEq/L Final  . Chloride 02/26/2014 105  96 - 112 mEq/L Final  . CO2 02/26/2014 30  19 - 32 mEq/L Final  . Glucose, Bld 02/26/2014 181* 70 - 99 mg/dL Final  . BUN 02/26/2014 10  6 - 23 mg/dL Final  . Creatinine, Ser 02/26/2014 0.60  0.50 - 1.10 mg/dL Final  . Calcium 02/26/2014 8.8  8.4 - 10.5 mg/dL Final  . GFR calc non Af Amer 02/26/2014 >90  >90 mL/min Final  . GFR calc Af Amer 02/26/2014 >90  >90 mL/min Final   Comment: (NOTE)                          The eGFR has been calculated using the CKD EPI equation.                          This calculation has not been validated in all clinical situations.                          eGFR's persistently <90 mL/min signify possible Chronic Kidney                          Disease.  . Glucose-Capillary 02/25/2014 200* 70 - 99 mg/dL Final  . Comment 1 02/25/2014 Notify RN  Final  . Comment 2 02/25/2014 Documented in Chart   Final  . Glucose-Capillary 02/25/2014 219* 70 - 99 mg/dL Final  Hospital Outpatient Visit on 02/17/2014  Component Date Value Ref Range Status  . MRSA, PCR 02/17/2014 NEGATIVE  NEGATIVE Final  . Staphylococcus aureus 02/17/2014 NEGATIVE  NEGATIVE Final   Comment:                                 The Xpert SA Assay (FDA                          approved for NASAL specimens                          in patients  over 47 years of age),                          is one component of                          a comprehensive surveillance                          program.  Test performance has                          been validated by [employer] for patients greater                          than or equal to 51 year old.                          It is not intended                          to diagnose infection nor to                          guide or monitor treatment.  [name] aPTT 02/17/2014 35  24 - 37 seconds Final  . WBC 02/17/2014 7.6  4.0 - 10.5 K/uL Final  . RBC 02/17/2014 4.67  3.87 - 5.11 MIL/uL Final  . Hemoglobin 02/17/2014 13.0  12.0 - 15.0 g/dL Final  . HCT 02/17/2014 41.0  36.0 - 46.0 % Final  . MCV 02/17/2014 87.8  78.0 - 100.0 fL Final  . MCH 02/17/2014 27.8  26.0 - 34.0 pg Final  . MCHC 02/17/2014 31.7  30.0 - 36.0 g/dL Final  . RDW 02/17/2014 14.3  11.5 - 15.5 % Final  . Platelets 02/17/2014 279  150 - 400 K/uL Final  . Sodium 02/17/2014 141  137 - 147 mEq/L Final  . Potassium 02/17/2014 4.4  3.7 - 5.3 mEq/L Final  . Chloride 02/17/2014 103  96 - 112 mEq/L Final  . CO2 02/17/2014 29  19 - 32 mEq/L Final  . Glucose, Bld 02/17/2014 103* 70 - 99 mg/dL Final  . BUN 02/17/2014 7  6 - 23 mg/dL Final  . Creatinine, Ser  02/17/2014 0.57  0.50 - 1.10 mg/dL Final  . Calcium 02/17/2014 9.1  8.4 - 10.5 mg/dL Final  . Total Protein 02/17/2014 7.2  6.0 - 8.3 g/dL Final  . Albumin 02/17/2014 3.5  3.5 - 5.2 g/dL Final  . AST 02/17/2014 22  0 - 37 U/L Final  . ALT 02/17/2014 14  0 - 35 U/L Final  . Alkaline Phosphatase 02/17/2014 78  39 - 117 U/L Final  . Total Bilirubin 02/17/2014 0.3  0.3 - 1.2 mg/dL Final  . GFR calc non Af Amer 02/17/2014 >90  >90 mL/min Final  . GFR calc Af Amer 02/17/2014 >90  >90 mL/min Final   Comment: (NOTE)                          The eGFR has been calculated using the CKD EPI equation.                          This calculation has not been  validated in all clinical situations.                          eGFR's persistently <90 mL/min signify possible Chronic Kidney                          Disease.  [name] Prothrombin Time 02/17/2014 13.1  11.6 - 15.2 seconds Final  . INR 02/17/2014 1.01  0.00 - 1.49 Final  . Color, Urine 02/17/2014 YELLOW  YELLOW Final  . APPearance 02/17/2014 CLEAR  CLEAR Final  . Specific Gravity, Urine 02/17/2014 1.010  1.005 - 1.030 Final  . pH 02/17/2014 6.0  5.0 - 8.0 Final  . Glucose, UA 02/17/2014 NEGATIVE  NEGATIVE mg/dL Final  . Hgb urine dipstick 02/17/2014 NEGATIVE  NEGATIVE Final  . Bilirubin Urine 02/17/2014 NEGATIVE  NEGATIVE Final  . Ketones, ur 02/17/2014 NEGATIVE  NEGATIVE mg/dL Final  . Protein, ur 02/17/2014 NEGATIVE  NEGATIVE mg/dL Final  . Urobilinogen, UA 02/17/2014 0.2  0.0 - 1.0 mg/dL Final  . Nitrite 02/17/2014 NEGATIVE  NEGATIVE Final  . Leukocytes, UA 02/17/2014 NEGATIVE  NEGATIVE Final   MICROSCOPIC NOT DONE ON URINES WITH NEGATIVE PROTEIN, BLOOD, LEUKOCYTES, NITRITE, OR GLUCOSE <1000 mg/dL.     Hospital Course: RYLIN SAEZ is a 68 y.o. who was admitted to [facility]. They were brought to the operating room on 02/24/2014 and underwent Procedure(s): LEFT TOTAL KNEE REVISION.  Patient tolerated the procedure well and was later transferred to the recovery room and then to the orthopaedic floor for postoperative care.  They were given PO and IV analgesics for pain control following their surgery.  They were given 24 hours of postoperative antibiotics of  Anti-infectives   Start     Dose/Rate Route Frequency Ordered Stop   02/24/14 2300  vancomycin (VANCOCIN) IVPB 1000 mg/200 mL premix     1,000 mg 200 mL/hr over 60 Minutes Intravenous Every 12 hours 02/24/14 1503 02/25/14 0006   02/24/14 0738  ceFAZolin (ANCEF) IVPB 2 g/50 mL premix     2 g 100 mL/hr over 30 Minutes Intravenous On call to O.R. 02/24/14 0739 02/24/14 1100     and started on DVT prophylaxis in the form of  Xarelto.   PT and OT were ordered for total joint protocol.  Discharge planning consulted to help with postop disposition and equipment  needs.  Patient had a fair night on the evening of surgery.  They started to get up OOB with therapy on day one. Hemovac drain was pulled without difficulty.  Continued to work with therapy into day two.  Dressing was changed on day two and the incision was clean and dry.  Plan for post op day three was for continuation of therapy and discharge to SNF.    Diet: Diabetic diet Activity:WBAT Follow-up:in 2 weeks Disposition - Skilled nursing facility Discharged Condition: stable   Discharge Instructions   Call MD / Call 911    Complete by:  As directed   If you experience chest pain or shortness of breath, CALL 911 and be transported to the hospital emergency room.  If you develope a fever above 101 F, pus (white drainage) or increased drainage or redness at the wound, or calf pain, call your surgeon's office.     Change dressing    Complete by:  As directed   Change dressing daily with sterile 4 x 4 inch gauze dressing and apply TED hose.     Constipation Prevention    Complete by:  As directed   Drink plenty of fluids.  Prune juice may be helpful.  You may use a stool softener, such as Colace (over the counter) 100 mg twice a day.  Use MiraLax (over the counter) for constipation as needed.     Diet Carb Modified    Complete by:  As directed      Discharge instructions    Complete by:  As directed   Walk with your walker. Weight bearing as tolerated Change your dressing daily. Shower only, no tub bath. Call if any temperatures greater than 101 or any wound complications: [phone]     Do not put a pillow under the knee. Place it under the heel.    Complete by:  As directed      Driving restrictions    Complete by:  As directed   No driving     Increase activity slowly as tolerated    Complete by:  As directed             Medication List    STOP  taking these medications       Biotin 5000 MCG Tabs     cyclobenzaprine 10 MG tablet  Commonly known as:  FLEXERIL     HYDROcodone-acetaminophen 5-325 MG per tablet  Commonly known as:  NORCO/VICODIN      TAKE these medications       albuterol 108 (90 BASE) MCG/ACT inhaler  Commonly known as:  PROVENTIL HFA;VENTOLIN HFA  Inhale 1 puff into the lungs every 6 (six) hours as needed for wheezing or shortness of breath.     albuterol (2.5 MG/3ML) 0.083% nebulizer solution  Commonly known as:  PROVENTIL  Take 2.5 mg by nebulization every 6 (six) hours as needed for wheezing or shortness of breath.     benzonatate 100 MG capsule  Commonly known as:  TESSALON  Take 100 mg by mouth 3 (three) times daily as needed for cough.     cetirizine 10 MG tablet  Commonly known as:  ZYRTEC  Take 10 mg by mouth daily.     clonazePAM 0.5 MG tablet  Commonly known as:  KLONOPIN  Take 0.5 mg by mouth at bedtime.     diclofenac 1.3 % Ptch  Commonly known as:  FLECTOR  Place 1 patch onto the skin daily as needed (for pain).  Apply to back     DSS 100 MG Caps  Take 100 mg by mouth 2 (two) times daily.     DULoxetine 60 MG capsule  Commonly known as:  CYMBALTA  Take 60 mg by mouth every morning.     EPIPEN 0.3 mg/0.3 mL Devi  Generic drug:  EPINEPHrine  Inject 0.3 mg into the muscle once.     gabapentin 800 MG tablet  Commonly known as:  NEURONTIN  Take 800 mg by mouth 2 (two) times daily.     HYDROmorphone 2 MG tablet  Commonly known as:  DILAUDID  Take 1-2 tablets (2-4 mg total) by mouth every 4 (four) hours as needed for severe pain.     ketorolac 10 MG tablet  Commonly known as:  TORADOL  Take 10 mg by mouth every 6 (six) hours as needed (for headache).     levothyroxine 25 MCG tablet  Commonly known as:  SYNTHROID, LEVOTHROID  Take 25 mcg by mouth every morning.     methocarbamol 500 MG tablet  Commonly known as:  ROBAXIN  Take 1 tablet (500 mg total) by mouth every 6 (six)  hours as needed for muscle spasms.     mometasone-formoterol 100-5 MCG/ACT Aero  Commonly known as:  DULERA  Inhale 2 puffs into the lungs 2 (two) times daily.     ondansetron 8 MG tablet  Commonly known as:  ZOFRAN  Take 8 mg by mouth every 8 (eight) hours as needed for nausea or vomiting.     pantoprazole 40 MG tablet  Commonly known as:  PROTONIX  Take 40 mg by mouth daily.     primidone 50 MG tablet  Commonly known as:  MYSOLINE  Take 50 mg by mouth at bedtime.     rivaroxaban 10 MG Tabs tablet  Commonly known as:  XARELTO  Take 1 tablet (10 mg total) by mouth daily with breakfast.     rizatriptan 10 MG tablet  Commonly known as:  MAXALT  Take 10 mg by mouth as needed for migraine (migraine). May repeat in 2 hours if needed     sitaGLIPtin 100 MG tablet  Commonly known as:  JANUVIA  Take 100 mg by mouth daily. Takes at night.           Follow-up Information   Follow up with Gearlean Alf, MD. Schedule an appointment as soon as possible for a visit in 2 weeks.   Specialty:  Orthopedic Surgery   Contact information:   [address] [place] [address] [phone]       Signed: Ardeen Jourdain, PA-C Orthopaedic Surgery 02/26/2014, 7:37 AM

## 2014-02-26 NOTE — Progress Notes (Signed)
Pt placed on Auto CPAP 5-12 CMH20 with 3 LPM O2 bleed in.  Pt tolerating well at this time, RT to monitor and assess as needed.

## 2014-02-26 NOTE — Progress Notes (Signed)
Physical Therapy Treatment Patient Details Name: Hayley [name] MRN: [id number] DOB: 07-08-1946 Today's Date: 19-Mar-2014    History of Present Illness L TKA revision    PT Comments    Pt progressing, will benefit from SNF; HR 104 after PT, sats 83-91% on RA; RN aware, pt reports her O2 sats "run low" at home  Follow Up Recommendations  SNF     Equipment Recommendations  None recommended by PT    Recommendations for Other Services       Precautions / Restrictions Precautions Precautions: Knee Precaution Comments: I SLR today Required Braces or Orthoses: Knee Immobilizer - Left Knee Immobilizer - Left: Discontinue once straight leg raise with < 10 degree lag Restrictions Other Position/Activity Restrictions: WBAT LLE    Mobility  Bed Mobility Overal bed mobility: Needs Assistance Bed Mobility: Supine to Sit     Supine to sit: Supervision     General bed mobility comments: min for LLE, cues for self assist  Transfers Overall transfer level: Needs assistance Equipment used: Rolling walker (2 wheeled) Transfers: Sit to/from Stand Sit to Stand: Min guard;Min assist         General transfer comment: cues for hand placement and LLE position  Ambulation/Gait Ambulation/Gait assistance: Min assist Ambulation Distance (Feet): 11 Feet (x2) Assistive device: Rolling walker (2 wheeled) Gait Pattern/deviations: Step-to pattern;Antalgic     General Gait Details: cues for RW position, posture and breathing   Stairs            Wheelchair Mobility    Modified Rankin (Stroke Patients Only)       Balance                                    Cognition Arousal/Alertness: Awake/alert Behavior During Therapy: WFL for tasks assessed/performed Overall Cognitive Status: Within Functional Limits for tasks assessed                      Exercises Total Joint Exercises Ankle Circles/Pumps: AROM;Both;10 reps Quad Sets: AROM;Left;10 reps Heel  Slides: AROM;Left;10 reps;AAROM Hip ABduction/ADduction: AROM;AAROM;10 reps;Left Straight Leg Raises: AROM;AAROM;Strengthening;Left;10 reps  AAROM L knee ~8-80* flexion    General Comments        Pertinent Vitals/Pain See above; pain contorlled    Home Living                      Prior Function            PT Goals (current goals can now be found in the care plan section) Acute Rehab PT Goals Patient Stated Goal: get back to my garden PT Goal Formulation: With patient Time For Goal Achievement: 03/04/14 Potential to Achieve Goals: Good Progress towards PT goals: Progressing toward goals    Frequency  7X/week    PT Plan Current plan remains appropriate    Co-evaluation             End of Session Equipment Utilized During Treatment: Gait belt Activity Tolerance: Patient tolerated treatment well       Time: 0933-1006 PT Time Calculation (min): 33 min  Charges:  $Gait Training: 8-22 mins $Therapeutic Exercise: 8-22 mins                    G Codes:      Hayley [name] 03-19-2014, 10:24 AM

## 2014-02-26 NOTE — Progress Notes (Signed)
Pt wants RT back at 2230 to place on CPAP.  RT to monitor and assess as needed.

## 2014-02-27 DIAGNOSIS — K219 Gastro-esophageal reflux disease without esophagitis: Secondary | ICD-10-CM | POA: Diagnosis not present

## 2014-02-27 DIAGNOSIS — G4730 Sleep apnea, unspecified: Secondary | ICD-10-CM | POA: Diagnosis not present

## 2014-02-27 DIAGNOSIS — F3289 Other specified depressive episodes: Secondary | ICD-10-CM | POA: Diagnosis not present

## 2014-02-27 DIAGNOSIS — M25569 Pain in unspecified knee: Secondary | ICD-10-CM | POA: Diagnosis not present

## 2014-02-27 DIAGNOSIS — Z471 Aftercare following joint replacement surgery: Secondary | ICD-10-CM | POA: Diagnosis not present

## 2014-02-27 DIAGNOSIS — F411 Generalized anxiety disorder: Secondary | ICD-10-CM | POA: Diagnosis not present

## 2014-02-27 DIAGNOSIS — R269 Unspecified abnormalities of gait and mobility: Secondary | ICD-10-CM | POA: Diagnosis not present

## 2014-02-27 DIAGNOSIS — R279 Unspecified lack of coordination: Secondary | ICD-10-CM | POA: Diagnosis not present

## 2014-02-27 DIAGNOSIS — R490 Dysphonia: Secondary | ICD-10-CM | POA: Diagnosis not present

## 2014-02-27 DIAGNOSIS — D62 Acute posthemorrhagic anemia: Secondary | ICD-10-CM | POA: Diagnosis not present

## 2014-02-27 DIAGNOSIS — Z96659 Presence of unspecified artificial knee joint: Secondary | ICD-10-CM | POA: Diagnosis not present

## 2014-02-27 DIAGNOSIS — Z5189 Encounter for other specified aftercare: Secondary | ICD-10-CM | POA: Diagnosis not present

## 2014-02-27 DIAGNOSIS — T84029A Dislocation of unspecified internal joint prosthesis, initial encounter: Secondary | ICD-10-CM | POA: Diagnosis not present

## 2014-02-27 DIAGNOSIS — E1149 Type 2 diabetes mellitus with other diabetic neurological complication: Secondary | ICD-10-CM | POA: Diagnosis not present

## 2014-02-27 DIAGNOSIS — S99919A Unspecified injury of unspecified ankle, initial encounter: Secondary | ICD-10-CM | POA: Diagnosis not present

## 2014-02-27 DIAGNOSIS — S8990XA Unspecified injury of unspecified lower leg, initial encounter: Secondary | ICD-10-CM | POA: Diagnosis not present

## 2014-02-27 DIAGNOSIS — Z86718 Personal history of other venous thrombosis and embolism: Secondary | ICD-10-CM | POA: Diagnosis not present

## 2014-02-27 DIAGNOSIS — G589 Mononeuropathy, unspecified: Secondary | ICD-10-CM | POA: Diagnosis not present

## 2014-02-27 DIAGNOSIS — M6281 Muscle weakness (generalized): Secondary | ICD-10-CM | POA: Diagnosis not present

## 2014-02-27 DIAGNOSIS — F329 Major depressive disorder, single episode, unspecified: Secondary | ICD-10-CM | POA: Diagnosis not present

## 2014-02-27 DIAGNOSIS — T84099A Other mechanical complication of unspecified internal joint prosthesis, initial encounter: Secondary | ICD-10-CM | POA: Diagnosis not present

## 2014-02-27 DIAGNOSIS — R1312 Dysphagia, oropharyngeal phase: Secondary | ICD-10-CM | POA: Diagnosis not present

## 2014-02-27 DIAGNOSIS — E119 Type 2 diabetes mellitus without complications: Secondary | ICD-10-CM | POA: Diagnosis not present

## 2014-02-27 DIAGNOSIS — E039 Hypothyroidism, unspecified: Secondary | ICD-10-CM | POA: Diagnosis not present

## 2014-02-27 DIAGNOSIS — G43909 Migraine, unspecified, not intractable, without status migrainosus: Secondary | ICD-10-CM | POA: Diagnosis not present

## 2014-02-27 DIAGNOSIS — M1990 Unspecified osteoarthritis, unspecified site: Secondary | ICD-10-CM | POA: Diagnosis not present

## 2014-02-27 DIAGNOSIS — M1710 Unilateral primary osteoarthritis, unspecified knee: Secondary | ICD-10-CM | POA: Diagnosis not present

## 2014-02-27 LAB — CBC
HCT: 30.6 % — ABNORMAL LOW (ref 36.0–46.0)
Hemoglobin: 9.4 g/dL — ABNORMAL LOW (ref 12.0–15.0)
MCH: 27.6 pg (ref 26.0–34.0)
MCHC: 30.7 g/dL (ref 30.0–36.0)
MCV: 90 fL (ref 78.0–100.0)
Platelets: 244 10*3/uL (ref 150–400)
RBC: 3.4 MIL/uL — ABNORMAL LOW (ref 3.87–5.11)
RDW: 14.5 % (ref 11.5–15.5)
WBC: 7.5 10*3/uL (ref 4.0–10.5)

## 2014-02-27 LAB — BODY FLUID CULTURE: Culture: NO GROWTH

## 2014-02-27 LAB — GLUCOSE, CAPILLARY: Glucose-Capillary: 87 mg/dL (ref 70–99)

## 2014-02-27 NOTE — Progress Notes (Signed)
Report called to [place], Therapist, sports at [employer]...pt transported via [facility] to [facility].

## 2014-02-27 NOTE — Progress Notes (Signed)
Per MD, Pt ready for d/c.  Notified RN, Pt and facility [facility]).  Pt stated that her family is aware and declined to have CSW contact them.  Confirmed receipt of d/c summary, as admission arranged by Weekday CSW.  Facility ready to receive Pt.  Arranged for transportation.  Bernita Raisin, [place] [facility] [phone]

## 2014-02-27 NOTE — Progress Notes (Signed)
Physical Therapy Treatment Patient Details Name: Hayley [name] MRN: [id number] DOB: [birth year] Today's Date: 2014/03/18    History of Present Illness L TKA revision    PT Comments    Pt fatigued this am but agreeable to completing therex program.  OOB deferred at pt request.  Follow Up Recommendations  SNF     Equipment Recommendations  None recommended by PT    Recommendations for Other Services OT consult     Precautions / Restrictions Precautions Precautions: Knee Precaution Comments: I SLR today Required Braces or Orthoses: Knee Immobilizer - Left Knee Immobilizer - Left: Discontinue once straight leg raise with < 10 degree lag Restrictions Weight Bearing Restrictions: No Other Position/Activity Restrictions: WBAT LLE    Mobility  Bed Mobility Overal bed mobility:  (Deferred OOB activity at pt request 2* fatigue)                Transfers                    Ambulation/Gait                 Stairs            Wheelchair Mobility    Modified Rankin (Stroke Patients Only)       Balance                                    Cognition Arousal/Alertness: Awake/alert Behavior During Therapy: WFL for tasks assessed/performed Overall Cognitive Status: Within Functional Limits for tasks assessed                      Exercises Total Joint Exercises Ankle Circles/Pumps: AROM;Both;Supine;15 reps Quad Sets: AROM;Left;20 reps;Supine Heel Slides: AAROM;Left;20 reps;Supine Straight Leg Raises: AAROM;AROM;Strengthening;Left;20 reps;Supine Goniometric ROM: AAROM at L knee - 10 - 75    General Comments        Pertinent Vitals/Pain 5/10; premed, cold packs provided    Home Living                      Prior Function            PT Goals (current goals can now be found in the care plan section) Acute Rehab PT Goals Patient Stated Goal: get back to my garden PT Goal Formulation: With patient Time For  Goal Achievement: 03/04/14 Potential to Achieve Goals: Good Progress towards PT goals: Progressing toward goals    Frequency  7X/week    PT Plan Current plan remains appropriate    Co-evaluation             End of Session Equipment Utilized During Treatment: Gait belt Activity Tolerance: Patient tolerated treatment well Patient left: in bed;with call bell/phone within reach     Time: 1017-1043 PT Time Calculation (min): 26 min  Charges:  $Therapeutic Exercise: 23-37 mins                    G Codes:      Yeimi Debnam 2014-03-18, 12:55 PM

## 2014-02-27 NOTE — Progress Notes (Signed)
   Subjective: 3 Days Post-Op Procedure(s) (LRB): LEFT TOTAL KNEE REVISION (Left) Patient reports pain as mild.   Plan is to go Skilled nursing facility after hospital stay.  Objective: Vital signs in last 24 hours: Temp:  [97.2 F (36.2 C)-98.6 F (37 C)] 97.2 F (36.2 C) (06/13 0542) Pulse Rate:  [78-105] 78 (06/13 0542) Resp:  [16-18] 18 (06/13 0542) BP: (100-112)/(61-74) 100/68 mmHg (06/13 0542) SpO2:  [88 %-96 %] 88 % (06/13 0542)  Intake/Output from previous day:  Intake/Output Summary (Last 24 hours) at 02/27/14 0729 Last data filed at 02/26/14 1700  Gross per 24 hour  Intake    720 ml  Output      0 ml  Net    720 ml    Intake/Output this shift:    Labs:  Recent Labs  02/25/14 0415 02/26/14 0405 02/27/14 0533  HGB 10.7* 9.1* 9.4*    Recent Labs  02/26/14 0405 02/27/14 0533  WBC 10.4 7.5  RBC 3.29* 3.40*  HCT 28.9* 30.6*  PLT 219 244    Recent Labs  02/25/14 0415 02/26/14 0405  NA 139 142  K 4.7 4.8  CL 101 105  CO2 29 30  BUN 8 10  CREATININE 0.57 0.60  GLUCOSE 166* 181*  CALCIUM 9.1 8.8   No results found for this basename: LABPT, INR,  in the last 72 hours  EXAM General - Patient is Alert, Appropriate and Oriented Extremity - Neurologically intact Neurovascular intact Incision: dressing C/D/I No cellulitis present Compartment soft Dressing/Incision - clean, dry, no drainage Motor Function - intact, moving foot and toes well on exam.   Past Medical History  Diagnosis Date  . Hyperthyroidism   . Toxic goiter   . Reflux   . Colon polyps   . Migraines   . 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 Obesity, Class III, BMI 40-49.9 (morbid obesity) 04/27/2010    02-17-14 reports some weight loss- intentional  . Pelvic pain     02-17-14 "states thinks its back related on left groin"  . Anemia   . Clotting disorder 02-17-14    S/p appendectomy-developed Pulmonary emboli-tx. warfarin-d/c 1  yr ago.  [name] Neuromuscular disorder     hands and throat"spasmodic dysphonia"  . Osteoarthritis     in back(chronic pain)  . Pulmonary emboli     s/p Appendectomy [facility]13 [facility]  . Diabetes mellitus without complication     "borderline"- oral med  . Right knee pain     Assessment/Plan: 3 Days Post-Op Procedure(s) (LRB): LEFT TOTAL KNEE REVISION (Left) Principal Problem:   Failed total knee, left Active Problems:   Failed total knee arthroplasty   Discharge to SNF  DVT Prophylaxis - Xarelto Weight-Bearing as tolerated to left leg  Shirleymae Hauth V 02/27/2014, 7:29 AM

## 2014-03-01 ENCOUNTER — Encounter: Payer: Self-pay | Admitting: Adult Health

## 2014-03-01 ENCOUNTER — Non-Acute Institutional Stay (SKILLED_NURSING_FACILITY): Payer: Medicare Other | Admitting: Adult Health

## 2014-03-01 DIAGNOSIS — F419 Anxiety disorder, unspecified: Secondary | ICD-10-CM

## 2014-03-01 DIAGNOSIS — T84018A Broken internal joint prosthesis, other site, initial encounter: Secondary | ICD-10-CM

## 2014-03-01 DIAGNOSIS — K219 Gastro-esophageal reflux disease without esophagitis: Secondary | ICD-10-CM | POA: Diagnosis not present

## 2014-03-01 DIAGNOSIS — F411 Generalized anxiety disorder: Secondary | ICD-10-CM

## 2014-03-01 DIAGNOSIS — G589 Mononeuropathy, unspecified: Secondary | ICD-10-CM

## 2014-03-01 DIAGNOSIS — T84099A Other mechanical complication of unspecified internal joint prosthesis, initial encounter: Secondary | ICD-10-CM

## 2014-03-01 DIAGNOSIS — J387 Other diseases of larynx: Secondary | ICD-10-CM

## 2014-03-01 DIAGNOSIS — J45909 Unspecified asthma, uncomplicated: Secondary | ICD-10-CM

## 2014-03-01 DIAGNOSIS — J383 Other diseases of vocal cords: Secondary | ICD-10-CM

## 2014-03-01 DIAGNOSIS — G43909 Migraine, unspecified, not intractable, without status migrainosus: Secondary | ICD-10-CM | POA: Diagnosis not present

## 2014-03-01 DIAGNOSIS — Z96659 Presence of unspecified artificial knee joint: Secondary | ICD-10-CM

## 2014-03-01 DIAGNOSIS — D649 Anemia, unspecified: Secondary | ICD-10-CM

## 2014-03-01 DIAGNOSIS — Z9989 Dependence on other enabling machines and devices: Secondary | ICD-10-CM

## 2014-03-01 DIAGNOSIS — E039 Hypothyroidism, unspecified: Secondary | ICD-10-CM

## 2014-03-01 DIAGNOSIS — F3289 Other specified depressive episodes: Secondary | ICD-10-CM

## 2014-03-01 DIAGNOSIS — E119 Type 2 diabetes mellitus without complications: Secondary | ICD-10-CM

## 2014-03-01 DIAGNOSIS — G4733 Obstructive sleep apnea (adult) (pediatric): Secondary | ICD-10-CM

## 2014-03-01 DIAGNOSIS — F329 Major depressive disorder, single episode, unspecified: Secondary | ICD-10-CM

## 2014-03-01 LAB — ANAEROBIC CULTURE

## 2014-03-02 ENCOUNTER — Non-Acute Institutional Stay (SKILLED_NURSING_FACILITY): Payer: Medicare Other | Admitting: Internal Medicine

## 2014-03-02 DIAGNOSIS — E119 Type 2 diabetes mellitus without complications: Secondary | ICD-10-CM | POA: Diagnosis not present

## 2014-03-02 DIAGNOSIS — T84099A Other mechanical complication of unspecified internal joint prosthesis, initial encounter: Secondary | ICD-10-CM

## 2014-03-02 DIAGNOSIS — E039 Hypothyroidism, unspecified: Secondary | ICD-10-CM | POA: Diagnosis not present

## 2014-03-02 DIAGNOSIS — G589 Mononeuropathy, unspecified: Secondary | ICD-10-CM | POA: Diagnosis not present

## 2014-03-02 DIAGNOSIS — Z96659 Presence of unspecified artificial knee joint: Secondary | ICD-10-CM

## 2014-03-02 DIAGNOSIS — T84018A Broken internal joint prosthesis, other site, initial encounter: Secondary | ICD-10-CM

## 2014-03-03 ENCOUNTER — Non-Acute Institutional Stay (SKILLED_NURSING_FACILITY): Payer: Medicare Other | Admitting: Adult Health

## 2014-03-03 ENCOUNTER — Other Ambulatory Visit: Payer: Self-pay | Admitting: *Deleted

## 2014-03-03 ENCOUNTER — Encounter: Payer: Self-pay | Admitting: Adult Health

## 2014-03-03 DIAGNOSIS — D62 Acute posthemorrhagic anemia: Secondary | ICD-10-CM | POA: Insufficient documentation

## 2014-03-03 DIAGNOSIS — F419 Anxiety disorder, unspecified: Secondary | ICD-10-CM | POA: Insufficient documentation

## 2014-03-03 DIAGNOSIS — M25562 Pain in left knee: Secondary | ICD-10-CM | POA: Insufficient documentation

## 2014-03-03 DIAGNOSIS — M25569 Pain in unspecified knee: Secondary | ICD-10-CM

## 2014-03-03 DIAGNOSIS — E039 Hypothyroidism, unspecified: Secondary | ICD-10-CM | POA: Insufficient documentation

## 2014-03-03 DIAGNOSIS — J45909 Unspecified asthma, uncomplicated: Secondary | ICD-10-CM | POA: Insufficient documentation

## 2014-03-03 MED ORDER — HYDROCODONE-ACETAMINOPHEN 5-325 MG PO TABS: ORAL_TABLET | ORAL | Status: DC

## 2014-03-03 NOTE — Progress Notes (Signed)
Patient ID: Hayley [name], female   DOB: 28-May-1946, 67 y.o.   MRN: [id number]               PROGRESS NOTE  DATE: 03/03/14  FACILITY: Nursing Home Location: [facility]  LEVEL OF CARE: SNF (31)  Acute Visit  CHIEF COMPLAINT:  Management of Left knee pain and Anemia  HISTORY OF PRESENT ILLNESS: This is a 68 year old female who has hgb = 8.5 - low . She had a recent revision of her left total knee arthroplasty. No complaints of fatigue nor shortness of breath. She complained of Dilaudid making her "loopy." She reports that she used to take Norco @ home and has been helping her pain.  PAST MEDICAL HISTORY : Reviewed.  No changes/see problem list  CURRENT MEDICATIONS: Reviewed per MAR/see medication list  REVIEW OF SYSTEMS:  GENERAL: no change in appetite, no fatigue, no weight changes, no fever, chills or weakness RESPIRATORY: no cough, SOB, DOE, wheezing, hemoptysis CARDIAC: no chest pain, edema or palpitations GI: no abdominal pain, diarrhea, constipation, heart burn, nausea or vomiting  PHYSICAL EXAMINATION  GENERAL: no acute distress, obese NECK: supple, trachea midline, no neck masses, no thyroid tenderness, no thyromegaly LYMPHATICS: no LAN in the neck, no supraclavicular LAN RESPIRATORY: breathing is even & unlabored, BS CTAB CARDIAC: RRR, no murmur,no extra heart sounds, no edema GI: abdomen soft, normal BS, no masses, no tenderness, no hepatomegaly, no splenomegaly EXTREMITIES : able to move all 4 extremities PSYCHIATRIC: the patient is alert & oriented to person, affect & behavior appropriate  LABS/RADIOLOGY: 03/02/14  WBC 6.8 hemoglobin 8.5 hematocrit 28.9 Labs reviewed: Basic Metabolic Panel:  Recent Labs  12/21/13 1700 12/22/13 0342  02/17/14 1255 02/25/14 0415 02/26/14 0405  NA 141 140  < > 141 139 142  K 4.1 4.2  < > 4.4 4.7 4.8  CL 102 101  < > 103 101 105  CO2 28 26  < > 29 29 30   GLUCOSE 103* 284*  < > 103* 166* 181*  BUN 7 9  < > 7 8  10   CREATININE 0.70 0.60  < > 0.57 0.57 0.60  CALCIUM 9.0 9.2  < > 9.1 9.1 8.8  MG  --  2.0  --   --   --   --   PHOS  --  2.5  --   --   --   --   < > = values in this interval not displayed. Liver Function Tests:  Recent Labs  12/22/13 0342 02/17/14 1255  AST 16 22  ALT 14 14  ALKPHOS 80 78  BILITOT <0.2* 0.3  PROT 7.0 7.2  ALBUMIN 3.4* 3.5   CBC:  Recent Labs  12/21/13 1700  02/25/14 0415 02/26/14 0405 02/27/14 0533  WBC 7.8  < > 12.2* 10.4 7.5  NEUTROABS 5.0  --   --   --   --   HGB 12.8  < > 10.7* 9.1* 9.4*  HCT 40.9  < > 33.7* 28.9* 30.6*  MCV 89.3  < > 88.7 87.8 90.0  PLT 282  < > 254 219 244  < > = values in this interval not displayed.  Cardiac Enzymes:  Recent Labs  12/25/13 2157 12/26/13 0626 12/26/13 0902  TROPONINI <0.30 <0.30 <0.30   CBG:  Recent Labs  02/26/14 1722 02/26/14 2058 02/27/14 0751  GLUCAP 102* 173* 87   CLINICAL DATA:  Left-sided headache.   EXAM: CT HEAD WITHOUT CONTRAST  TECHNIQUE: Contiguous axial images were obtained from the base of the skull through the vertex without intravenous contrast.   COMPARISON:  12/1911   FINDINGS: Ventricles are normal in size and configuration. There are no parenchymal masses or mass effect. There is no evidence of a cortical infarct. Small hypoattenuation focus in the superior right cerebellum may reflect an old lacune infarct. It could potentially reflect a dilated perivascular space. It is stable.   No extra-axial masses or abnormal fluid collections.   There is no intracranial hemorrhage.   Visualized sinuses and mastoid air cells are clear.   IMPRESSION: 1. No acute intracranial abnormality. No change from the prior study. CLINICAL DATA:  Cough.  Wheezing.  Short of breath.  Weakness.   EXAM: CHEST  2 VIEW   COMPARISON:  DG CHEST 2V dated 11/19/2013   FINDINGS: Cardiopericardial silhouette within normal limits. Mediastinal contours normal. Trachea midline. No  airspace disease or effusion. Suboptimal penetration due to obese body habitus. Basilar atelectasis. Monitoring leads project over the chest.   IMPRESSION: No active cardiopulmonary disease. CLINICAL DATA:  Shortness of breath.  Rule out pulmonary embolism.   EXAM: CT ANGIOGRAPHY CHEST WITH CONTRAST   TECHNIQUE: Multidetector CT imaging of the chest was performed using the standard protocol during bolus administration of intravenous contrast. Multiplanar CT image reconstructions and MIPs were obtained to evaluate the vascular anatomy.   CONTRAST:  17mL OMNIPAQUE IOHEXOL 350 MG/ML SOLN   COMPARISON:  12/06/2011   FINDINGS: Lungs are adequately inflated and demonstrate linear atelectasis over the medial posterior right lower lobe. There is possible minimal debris within the proximal right lower lobe bronchus. There is minimal linear scarring versus atelectasis over the left lower lobe. There is no evidence of effusion. Note that the extreme lung apices are excluded from the field-of-view.   Heart is normal size. There is calcified plaque over the left and percent descending coronary artery. There is no evidence of pulmonary embolism. Remaining mediastinal structures are within normal.   Images through the upper abdomen are unremarkable. There are degenerative changes of the spine.   Review of the MIP images confirms the above findings.   IMPRESSION: No evidence of pulmonary embolism. No acute cardiopulmonary disease.   Possible minimal debris within the proximal right lower lobe bronchus. Linear consolidation over the posterior medial right lower lobe most typical of atelectasis, although cannot completely exclude infection secondary to aspiration. Minimal scarring versus linear atelectasis over the left lower lobe.   Mild atherosclerotic coronary artery disease.      ASSESSMENT/PLAN:  Anemia, acute blood loss - start FeSO4 325 mg 1 Tab PO BID; CBC in 1 week Left  knee pain - discontinue Dilaudid; start Norco 325 mg 1-2 tabs po Q 4 hours PRN  CPT CODE: 55974  Seth Bake - NP [facility] [phone]

## 2014-03-03 NOTE — Progress Notes (Signed)
Patient ID: Hayley [name], female   DOB: Apr 08, 1946, 68 y.o.   MRN: [id number]               PROGRESS NOTE  DATE: 03/01/2014  FACILITY: Nursing Home Location: [facility]  LEVEL OF CARE: SNF (31)  Acute Visit  CHIEF COMPLAINT:  Follow-up Hospitalization  HISTORY OF PRESENT ILLNESS: This is a 68 year old female who has been admitted to [facility] on 02/27/14 from [facility] with Failed left total knee arthroplasty S/P Left total knee revision. She has been admitted for a short-term rehabilitation.  REASSESSMENT OF ONGOING PROBLEM(S):  DM:pt's DM remains stable.  Pt denies polyuria, polydipsia, polyphagia, changes in vision or hypoglycemic episodes.  No complications noted from the medication presently being used.   4/15 hemoglobin A1c is: 7.2  PERIPHERAL NEUROPATHY: The peripheral neuropathy is stable. The patient denies pain in the feet, tingling, and numbness. No complications noted from the medication presently being used.  GERD: pt's GERD is stable.  Denies ongoing heartburn, abd. Pain, nausea or vomiting.  Currently on a PPI & tolerates it without any adverse reactions.  PAST MEDICAL HISTORY : Reviewed.  No changes/see problem list  CURRENT MEDICATIONS: Reviewed per MAR/see medication list  REVIEW OF SYSTEMS:  GENERAL: no change in appetite, no fatigue, no weight changes, no fever, chills or weakness RESPIRATORY: no cough, SOB, DOE, wheezing, hemoptysis CARDIAC: no chest pain, edema or palpitations GI: no abdominal pain, diarrhea, constipation, heart burn, nausea or vomiting  PHYSICAL EXAMINATION  GENERAL: no acute distress, obese EYES: conjunctivae normal, sclerae normal, normal eye lids NECK: supple, trachea midline, no neck masses, no thyroid tenderness, no thyromegaly LYMPHATICS: no LAN in the neck, no supraclavicular LAN RESPIRATORY: breathing is even & unlabored, BS CTAB CARDIAC: RRR, no murmur,no extra heart sounds, no edema GI: abdomen  soft, normal BS, no masses, no tenderness, no hepatomegaly, no splenomegaly EXTREMITIES : able to move all 4 extremities PSYCHIATRIC: the patient is alert & oriented to person, affect & behavior appropriate  LABS/RADIOLOGY: Labs reviewed: Basic Metabolic Panel:  Recent Labs  12/21/13 1700 12/22/13 0342  02/17/14 1255 02/25/14 0415 02/26/14 0405  NA 141 140  < > 141 139 142  K 4.1 4.2  < > 4.4 4.7 4.8  CL 102 101  < > 103 101 105  CO2 28 26  < > 29 29 30   GLUCOSE 103* 284*  < > 103* 166* 181*  BUN 7 9  < > 7 8 10   CREATININE 0.70 0.60  < > 0.57 0.57 0.60  CALCIUM 9.0 9.2  < > 9.1 9.1 8.8  MG  --  2.0  --   --   --   --   PHOS  --  2.5  --   --   --   --   < > = values in this interval not displayed. Liver Function Tests:  Recent Labs  12/22/13 0342 02/17/14 1255  AST 16 22  ALT 14 14  ALKPHOS 80 78  BILITOT <0.2* 0.3  PROT 7.0 7.2  ALBUMIN 3.4* 3.5   CBC:  Recent Labs  12/21/13 1700  02/25/14 0415 02/26/14 0405 02/27/14 0533  WBC 7.8  < > 12.2* 10.4 7.5  NEUTROABS 5.0  --   --   --   --   HGB 12.8  < > 10.7* 9.1* 9.4*  HCT 40.9  < > 33.7* 28.9* 30.6*  MCV 89.3  < > 88.7 87.8 90.0  PLT 282  < > 254 219 244  < > = values in this interval not displayed.  Cardiac Enzymes:  Recent Labs  12/25/13 2157 12/26/13 0626 12/26/13 0902  TROPONINI <0.30 <0.30 <0.30   CBG:  Recent Labs  02/26/14 1722 02/26/14 2058 02/27/14 0751  GLUCAP 102* 173* 87   CLINICAL DATA:  Left-sided headache.   EXAM: CT HEAD WITHOUT CONTRAST   TECHNIQUE: Contiguous axial images were obtained from the base of the skull through the vertex without intravenous contrast.   COMPARISON:  12/1911   FINDINGS: Ventricles are normal in size and configuration. There are no parenchymal masses or mass effect. There is no evidence of a cortical infarct. Small hypoattenuation focus in the superior right cerebellum may reflect an old lacune infarct. It could potentially reflect a  dilated perivascular space. It is stable.   No extra-axial masses or abnormal fluid collections.   There is no intracranial hemorrhage.   Visualized sinuses and mastoid air cells are clear.   IMPRESSION: 1. No acute intracranial abnormality. No change from the prior study. CLINICAL DATA:  Cough.  Wheezing.  Short of breath.  Weakness.   EXAM: CHEST  2 VIEW   COMPARISON:  DG CHEST 2V dated 11/19/2013   FINDINGS: Cardiopericardial silhouette within normal limits. Mediastinal contours normal. Trachea midline. No airspace disease or effusion. Suboptimal penetration due to obese body habitus. Basilar atelectasis. Monitoring leads project over the chest.   IMPRESSION: No active cardiopulmonary disease. CLINICAL DATA:  Shortness of breath.  Rule out pulmonary embolism.   EXAM: CT ANGIOGRAPHY CHEST WITH CONTRAST   TECHNIQUE: Multidetector CT imaging of the chest was performed using the standard protocol during bolus administration of intravenous contrast. Multiplanar CT image reconstructions and MIPs were obtained to evaluate the vascular anatomy.   CONTRAST:  12mL OMNIPAQUE IOHEXOL 350 MG/ML SOLN   COMPARISON:  12/06/2011   FINDINGS: Lungs are adequately inflated and demonstrate linear atelectasis over the medial posterior right lower lobe. There is possible minimal debris within the proximal right lower lobe bronchus. There is minimal linear scarring versus atelectasis over the left lower lobe. There is no evidence of effusion. Note that the extreme lung apices are excluded from the field-of-view.   Heart is normal size. There is calcified plaque over the left and percent descending coronary artery. There is no evidence of pulmonary embolism. Remaining mediastinal structures are within normal.   Images through the upper abdomen are unremarkable. There are degenerative changes of the spine.   Review of the MIP images confirms the above findings.   IMPRESSION: No  evidence of pulmonary embolism. No acute cardiopulmonary disease.   Possible minimal debris within the proximal right lower lobe bronchus. Linear consolidation over the posterior medial right lower lobe most typical of atelectasis, although cannot completely exclude infection secondary to aspiration. Minimal scarring versus linear atelectasis over the left lower lobe.   Mild atherosclerotic coronary artery disease.      ASSESSMENT/PLAN:  Failed left total knee arthroplasty S/P Left total knee revision - for rehabilitation GERD - stable; continue Protonix Migraine Headache - stable; continue Maxalt PRN Anemia, acute blood loss - stable Diabetes Mellitus, type II - continue Januvia and Novolog sliding scale Sleep Apnea - has CPAP @ night Asthma - continue Zyrtec and Dulera puff Anxiety - continue Klonopin Depression - continue Cymbalta Peripheral Neuropathy - continue Neurontin    CPT CODE: 17408  Monina Vargas - NP [facility] [phone]

## 2014-03-03 NOTE — Telephone Encounter (Signed)
[facility] 

## 2014-03-04 NOTE — Progress Notes (Signed)
HISTORY & PHYSICAL  DATE: 03/02/2014   FACILITY: [facility]  LEVEL OF CARE: SNF (31)  ALLERGIES:  Allergies  Allergen Reactions  . Nutritional Supplements Anaphylaxis    Pt does not remember this happening  . Bee Venom     unknown  . Etodolac Nausea And Vomiting  . Morphine Other (See Comments)    Hallucinations  . Propoxyphene N-Acetaminophen Nausea And Vomiting  . Penicillins Rash    CHIEF COMPLAINT:  Manage left knee pain, hypothyroidism and diabetes mellitus  HISTORY OF PRESENT ILLNESS: Patient is a 68 year old Caucasian female.  LEFT KNEE PAIN: Patient has a history of left total knee arthroplasty. Patient started having knee pain which was treated conservatively but she failed conservative management. She continued to have progressive pain and severe functional limitations and dysfunction. She underwent a left total knee revision and tolerated the procedure well. Patient is admitted to this facility for short-term rehabilitation. She denies current knee pain. Starting her pain medications without any side effects.  DM:pt's DM remains stable.  Pt denies polyuria, polydipsia, polyphagia, changes in vision or hypoglycemic episodes.  No complications noted from the medication presently being used.  Last hemoglobin A1c is: Not available.  HYPOTHYROIDISM: The hypothyroidism remains stable. No complications noted from the medications presently being used.  The patient denies fatigue or constipation.  Last TSH not available.  PAST MEDICAL HISTORY :  Past Medical History  Diagnosis Date  . Hyperthyroidism   . Toxic goiter   . Reflux   . Colon polyps   . Migraines   . 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 Obesity, Class III, BMI 40-49.9 (morbid obesity) 04/27/2010    02-17-14 reports some weight loss- intentional  . Pelvic pain     02-17-14 "states thinks its back related on left groin"  . Anemia    . Clotting disorder 02-17-14    S/p appendectomy-developed Pulmonary emboli-tx. warfarin-d/c 1 yr ago.  [name] Neuromuscular disorder     hands and throat"spasmodic dysphonia"  . Osteoarthritis     in back(chronic pain)  . Pulmonary emboli     s/p Appendectomy '13 [facility]  . Diabetes mellitus without complication     "borderline"- oral med  . Right knee pain     PAST SURGICAL HISTORY: Past Surgical History  Procedure Laterality Date  . Abdominal hysterectomy    . Zenkers diverticulum    . Kidney stone surgery    . Laparoscopic appendectomy  11/27/2011    Procedure: APPENDECTOMY LAPAROSCOPIC;  Surgeon: Adin Hector, MD;  Location: [facility];  Service: General;  Laterality: N/A;  . Cholecystectomy    . Knee arthroscopy  1998    right  . Hand surgery  2004    both hands  . Botox injection      for migranes  . Cataract extraction, bilateral Bilateral 02-17-14  . Vocal cord surgery  02-17-14    01-12-14 done in [place] [facility](s/p selective denervation/reinnervation recurrent laryngeal nerve surgery)  . Appendectomy      3'13 [facility] s/p developed  Pulmonary Emboli  . Total knee revision Left 02/24/2014    Procedure: LEFT TOTAL KNEE REVISION;  Surgeon: Gearlean Alf, MD;  Location: [facility];  Service: Orthopedics;  Laterality: Left;    SOCIAL HISTORY:  reports that she quit smoking about 32 years ago. She has never used smokeless tobacco. She reports that she does  not drink alcohol or use illicit drugs.  FAMILY HISTORY:  Family History  Problem Relation Age of Onset  . Heart disease Mother     CURRENT MEDICATIONS: Reviewed per MAR/see medication list  REVIEW OF SYSTEMS:  See HPI otherwise 14 point ROS is negative.  PHYSICAL EXAMINATION  VS:  See VS section  GENERAL: no acute distress, morbidly obese body habitus EYES: conjunctivae normal, sclerae normal, normal eye lids MOUTH/THROAT: lips without lesions,no lesions in the mouth,tongue is without lesions,uvula elevates in  midline NECK: supple, trachea midline, no neck masses, no thyroid tenderness, no thyromegaly LYMPHATICS: no LAN in the neck, no supraclavicular LAN RESPIRATORY: breathing is even & unlabored, BS CTAB CARDIAC: RRR, no murmur,no extra heart sounds, left lower extremity +1 edema GI:  ABDOMEN: abdomen soft, normal BS, no masses, no tenderness  LIVER/SPLEEN: no hepatomegaly, no splenomegaly MUSCULOSKELETAL: HEAD: normal to inspection  EXTREMITIES: LEFT UPPER EXTREMITY: full range of motion, normal strength & tone RIGHT UPPER EXTREMITY:  full range of motion, normal strength & tone LEFT LOWER EXTREMITY:  range of motion not tested due to surgery, normal strength & tone RIGHT LOWER EXTREMITY:  range of motion moderate, normal strength & tone PSYCHIATRIC: the patient is alert & oriented to person, affect & behavior appropriate  LABS/RADIOLOGY:  Labs reviewed: Basic Metabolic Panel:  Recent Labs  12/21/13 1700 12/22/13 0342  02/17/14 1255 02/25/14 0415 02/26/14 0405  NA 141 140  < > 141 139 142  K 4.1 4.2  < > 4.4 4.7 4.8  CL 102 101  < > 103 101 105  CO2 28 26  < > 29 29 30   GLUCOSE 103* 284*  < > 103* 166* 181*  BUN 7 9  < > 7 8 10   CREATININE 0.70 0.60  < > 0.57 0.57 0.60  CALCIUM 9.0 9.2  < > 9.1 9.1 8.8  MG  --  2.0  --   --   --   --   PHOS  --  2.5  --   --   --   --   < > = values in this interval not displayed. Liver Function Tests:  Recent Labs  12/22/13 0342 02/17/14 1255  AST 16 22  ALT 14 14  ALKPHOS 80 78  BILITOT <0.2* 0.3  PROT 7.0 7.2  ALBUMIN 3.4* 3.5   CBC:  Recent Labs  12/21/13 1700  02/25/14 0415 02/26/14 0405 02/27/14 0533  WBC 7.8  < > 12.2* 10.4 7.5  NEUTROABS 5.0  --   --   --   --   HGB 12.8  < > 10.7* 9.1* 9.4*  HCT 40.9  < > 33.7* 28.9* 30.6*  MCV 89.3  < > 88.7 87.8 90.0  PLT 282  < > 254 219 244  < > = values in this interval not displayed.  Cardiac Enzymes:  Recent Labs  12/25/13 2157 12/26/13 0626 12/26/13 0902   TROPONINI <0.30 <0.30 <0.30   CBG:  Recent Labs  02/26/14 1722 02/26/14 2058 02/27/14 0751  GLUCAP 102* 173* 87    ASSESSMENT/PLAN:  Left knee pain-status post revision. Continue rehabilitation. Hypothyroidism-continue levothyroxine Diabetes mellitus-continue januvia Peripheral neuropathy-continue Neurontin  Acute blood loss anemia-check hemoglobin level GERD-continue PPI Check CBC with differential  I have reviewed patient's medical records received at admission/from hospitalization.  CPT CODE: 27253  Gayani Y Dasanayaka, [place] [phone]

## 2014-03-05 ENCOUNTER — Other Ambulatory Visit: Payer: Self-pay | Admitting: *Deleted

## 2014-03-05 MED ORDER — HYDROCODONE-ACETAMINOPHEN 5-325 MG PO TABS: ORAL_TABLET | ORAL | Status: DC

## 2014-03-05 NOTE — Telephone Encounter (Signed)
[facility] 

## 2014-03-09 ENCOUNTER — Other Ambulatory Visit: Payer: Self-pay | Admitting: *Deleted

## 2014-03-09 MED ORDER — HYDROCODONE-ACETAMINOPHEN 5-325 MG PO TABS: ORAL_TABLET | ORAL | Status: DC

## 2014-03-09 NOTE — Telephone Encounter (Signed)
[facility] 

## 2014-03-12 ENCOUNTER — Encounter: Payer: Self-pay | Admitting: Adult Health

## 2014-03-12 ENCOUNTER — Non-Acute Institutional Stay (SKILLED_NURSING_FACILITY): Payer: Medicare Other | Admitting: Adult Health

## 2014-03-12 DIAGNOSIS — E039 Hypothyroidism, unspecified: Secondary | ICD-10-CM

## 2014-03-12 DIAGNOSIS — Z5189 Encounter for other specified aftercare: Secondary | ICD-10-CM

## 2014-03-12 DIAGNOSIS — F419 Anxiety disorder, unspecified: Secondary | ICD-10-CM

## 2014-03-12 DIAGNOSIS — E119 Type 2 diabetes mellitus without complications: Secondary | ICD-10-CM | POA: Diagnosis not present

## 2014-03-12 DIAGNOSIS — D649 Anemia, unspecified: Secondary | ICD-10-CM

## 2014-03-12 DIAGNOSIS — G43909 Migraine, unspecified, not intractable, without status migrainosus: Secondary | ICD-10-CM

## 2014-03-12 DIAGNOSIS — J383 Other diseases of vocal cords: Secondary | ICD-10-CM

## 2014-03-12 DIAGNOSIS — K219 Gastro-esophageal reflux disease without esophagitis: Secondary | ICD-10-CM

## 2014-03-12 DIAGNOSIS — F411 Generalized anxiety disorder: Secondary | ICD-10-CM

## 2014-03-12 DIAGNOSIS — F329 Major depressive disorder, single episode, unspecified: Secondary | ICD-10-CM

## 2014-03-12 DIAGNOSIS — J387 Other diseases of larynx: Secondary | ICD-10-CM

## 2014-03-12 DIAGNOSIS — G589 Mononeuropathy, unspecified: Secondary | ICD-10-CM

## 2014-03-12 DIAGNOSIS — T84018D Broken internal joint prosthesis, other site, subsequent encounter: Secondary | ICD-10-CM

## 2014-03-12 DIAGNOSIS — G4733 Obstructive sleep apnea (adult) (pediatric): Secondary | ICD-10-CM

## 2014-03-12 DIAGNOSIS — T84099A Other mechanical complication of unspecified internal joint prosthesis, initial encounter: Secondary | ICD-10-CM

## 2014-03-12 DIAGNOSIS — J45909 Unspecified asthma, uncomplicated: Secondary | ICD-10-CM

## 2014-03-12 DIAGNOSIS — F3289 Other specified depressive episodes: Secondary | ICD-10-CM

## 2014-03-12 DIAGNOSIS — Z96659 Presence of unspecified artificial knee joint: Secondary | ICD-10-CM

## 2014-03-12 DIAGNOSIS — Z9989 Dependence on other enabling machines and devices: Secondary | ICD-10-CM

## 2014-03-12 NOTE — Progress Notes (Signed)
Patient ID: Hayley [name], female   DOB: 03-19-1946, 68 y.o.   MRN: [id number]              PROGRESS NOTE  DATE:   03/12/14  FACILITY: Nursing Home Location: [facility]  LEVEL OF CARE: SNF (31)  Acute Visit  CHIEF COMPLAINT:  Discharge Notes  HISTORY OF PRESENT ILLNESS: This is a 68 year old female who is for discharge home with Home health PT, ST, OT and Nursing. DME: Rolling walker and bedside commode. 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 Patient was admitted to this facility for short-term rehabilitation after the patient's recent hospitalization.  Patient has completed SNF rehabilitation and therapy has cleared the patient for discharge.   REASSESSMENT OF ONGOING PROBLEM(S):  ASTHMA: The patient's asthma remains stable. Patient denies shortness of breath, dyspnea on exertion or wheezing. No complications reported from the medications currently being used.  DEPRESSION: The depression remains stable. Patient denies ongoing feelings of sadness, insomnia, anedhonia or lack of appetite. No complications reported from the medications currently being used. Staff do not report behavioral problems.  DM:pt's DM remains stable.  Pt denies polyuria, polydipsia, polyphagia, changes in vision or hypoglycemic episodes.  No complications noted from the medication presently being used.   4/15 hemoglobin A1c is: 7.2   PAST MEDICAL HISTORY : Reviewed.  No changes/see problem list  CURRENT MEDICATIONS: Reviewed per MAR/see medication list  REVIEW OF SYSTEMS:  GENERAL: no change in appetite, no fatigue, no weight changes, no fever, chills or weakness RESPIRATORY: no cough, SOB, DOE, wheezing, hemoptysis CARDIAC: no chest pain, edema or palpitations GI: no abdominal pain, diarrhea, constipation, heart burn, nausea or vomiting  PHYSICAL EXAMINATION  GENERAL: no acute  distress, obese NECK: supple, trachea midline, no neck masses, no thyroid tenderness, no thyromegaly LYMPHATICS: no LAN in the neck, no supraclavicular LAN RESPIRATORY: breathing is even & unlabored, BS CTAB CARDIAC: RRR, no murmur,no extra heart sounds, no edema GI: abdomen soft, normal BS, no masses, no tenderness, no hepatomegaly, no splenomegaly EXTREMITIES : able to move all 4 extremities PSYCHIATRIC: the patient is alert & oriented to person, affect & behavior appropriate  LABS/RADIOLOGY: 03/10/14  WBC 6.3 hemoglobin 9.2 hematocrit 51.9 03/02/14   WBC 6.8 hemoglobin 8.5 hematocrit 28.9  Labs reviewed: Basic Metabolic Panel:  Recent Labs  12/21/13 1700 12/22/13 0342  02/17/14 1255 02/25/14 0415 02/26/14 0405  NA 141 140  < > 141 139 142  K 4.1 4.2  < > 4.4 4.7 4.8  CL 102 101  < > 103 101 105  CO2 28 26  < > 29 29 30   GLUCOSE 103* 284*  < > 103* 166* 181*  BUN 7 9  < > 7 8 10   CREATININE 0.70 0.60  < > 0.57 0.57 0.60  CALCIUM 9.0 9.2  < > 9.1 9.1 8.8  MG  --  2.0  --   --   --   --   PHOS  --  2.5  --   --   --   --   < > = values in this interval not displayed. Liver Function Tests:  Recent Labs  12/22/13 0342 02/17/14 1255  AST 16 22  ALT 14 14  ALKPHOS 80 78  BILITOT <0.2* 0.3  PROT 7.0 7.2  ALBUMIN 3.4* 3.5   CBC:  Recent Labs  12/21/13 1700  02/25/14  9147 02/26/14 0405 02/27/14 0533  WBC 7.8  < > 12.2* 10.4 7.5  NEUTROABS 5.0  --   --   --   --   HGB 12.8  < > 10.7* 9.1* 9.4*  HCT 40.9  < > 33.7* 28.9* 30.6*  MCV 89.3  < > 88.7 87.8 90.0  PLT 282  < > 254 219 244  < > = values in this interval not displayed.  Cardiac Enzymes:  Recent Labs  12/25/13 2157 12/26/13 0626 12/26/13 0902  TROPONINI <0.30 <0.30 <0.30   CBG:  Recent Labs  02/26/14 1722 02/26/14 2058 02/27/14 0751  GLUCAP 102* 173* 87   CLINICAL DATA:  Left-sided headache.   EXAM: CT HEAD WITHOUT CONTRAST   TECHNIQUE: Contiguous axial images were obtained from the  base of the skull through the vertex without intravenous contrast.   COMPARISON:  12/1911   FINDINGS: Ventricles are normal in size and configuration. There are no parenchymal masses or mass effect. There is no evidence of a cortical infarct. Small hypoattenuation focus in the superior right cerebellum may reflect an old lacune infarct. It could potentially reflect a dilated perivascular space. It is stable.   No extra-axial masses or abnormal fluid collections.   There is no intracranial hemorrhage.   Visualized sinuses and mastoid air cells are clear.   IMPRESSION: 1. No acute intracranial abnormality. No change from the prior study. CLINICAL DATA:  Cough.  Wheezing.  Short of breath.  Weakness.   EXAM: CHEST  2 VIEW   COMPARISON:  DG CHEST 2V dated 11/19/2013   FINDINGS: Cardiopericardial silhouette within normal limits. Mediastinal contours normal. Trachea midline. No airspace disease or effusion. Suboptimal penetration due to obese body habitus. Basilar atelectasis. Monitoring leads project over the chest.   IMPRESSION: No active cardiopulmonary disease. CLINICAL DATA:  Shortness of breath.  Rule out pulmonary embolism.   EXAM: CT ANGIOGRAPHY CHEST WITH CONTRAST   TECHNIQUE: Multidetector CT imaging of the chest was performed using the standard protocol during bolus administration of intravenous contrast. Multiplanar CT image reconstructions and MIPs were obtained to evaluate the vascular anatomy.   CONTRAST:  148mL OMNIPAQUE IOHEXOL 350 MG/ML SOLN   COMPARISON:  12/06/2011   FINDINGS: Lungs are adequately inflated and demonstrate linear atelectasis over the medial posterior right lower lobe. There is possible minimal debris within the proximal right lower lobe bronchus. There is minimal linear scarring versus atelectasis over the left lower lobe. There is no evidence of effusion. Note that the extreme lung apices are excluded from the field-of-view.     Heart is normal size. There is calcified plaque over the left and percent descending coronary artery. There is no evidence of pulmonary embolism. Remaining mediastinal structures are within normal.   Images through the upper abdomen are unremarkable. There are degenerative changes of the spine.   Review of the MIP images confirms the above findings.   IMPRESSION: No evidence of pulmonary embolism. No acute cardiopulmonary disease.   Possible minimal debris within the proximal right lower lobe bronchus. Linear consolidation over the posterior medial right lower lobe most typical of atelectasis, although cannot completely exclude infection secondary to aspiration. Minimal scarring versus linear atelectasis over the left lower lobe.   Mild atherosclerotic coronary artery disease.      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 GERD - stable; continue Protonix Migraine Headache - stable; continue Maxalt PRN Anemia, acute blood  loss - stable Diabetes Mellitus, type II - continue Januvia and Novolog sliding scale Sleep Apnea - has CPAP @ night Asthma - continue Zyrtec and Dulera puff Anxiety - continue Klonopin Depression - continue Cymbalta Peripheral Neuropathy - continue Neurontin   I have filled out patient's discharge paperwork and written prescriptions.  Patient will receive home health PT, OT, ST and Nursing.     DME provided:  Rolling walker and bedside commode  Total discharge time: Greater than 30 minutes  Discharge time involved coordination of the discharge process with Education officer, museum, nursing staff and therapy department. Medical justification for home health services/DME verified.    CPT CODE: 16553   Seth Bake - NP [facility] [phone]

## 2014-03-14 DIAGNOSIS — E119 Type 2 diabetes mellitus without complications: Secondary | ICD-10-CM | POA: Diagnosis not present

## 2014-03-14 DIAGNOSIS — Z4789 Encounter for other orthopedic aftercare: Secondary | ICD-10-CM | POA: Diagnosis not present

## 2014-03-14 DIAGNOSIS — J441 Chronic obstructive pulmonary disease with (acute) exacerbation: Secondary | ICD-10-CM | POA: Diagnosis not present

## 2014-03-14 DIAGNOSIS — Z96659 Presence of unspecified artificial knee joint: Secondary | ICD-10-CM | POA: Diagnosis not present

## 2014-03-14 DIAGNOSIS — M5137 Other intervertebral disc degeneration, lumbosacral region: Secondary | ICD-10-CM | POA: Diagnosis not present

## 2014-03-14 DIAGNOSIS — G609 Hereditary and idiopathic neuropathy, unspecified: Secondary | ICD-10-CM | POA: Diagnosis not present

## 2014-03-14 DIAGNOSIS — T84039A Mechanical loosening of unspecified internal prosthetic joint, initial encounter: Secondary | ICD-10-CM | POA: Diagnosis not present

## 2014-03-15 DIAGNOSIS — E119 Type 2 diabetes mellitus without complications: Secondary | ICD-10-CM | POA: Diagnosis not present

## 2014-03-15 DIAGNOSIS — M5137 Other intervertebral disc degeneration, lumbosacral region: Secondary | ICD-10-CM | POA: Diagnosis not present

## 2014-03-15 DIAGNOSIS — G609 Hereditary and idiopathic neuropathy, unspecified: Secondary | ICD-10-CM | POA: Diagnosis not present

## 2014-03-15 DIAGNOSIS — J441 Chronic obstructive pulmonary disease with (acute) exacerbation: Secondary | ICD-10-CM | POA: Diagnosis not present

## 2014-03-15 DIAGNOSIS — Z4789 Encounter for other orthopedic aftercare: Secondary | ICD-10-CM | POA: Diagnosis not present

## 2014-03-15 DIAGNOSIS — Z96659 Presence of unspecified artificial knee joint: Secondary | ICD-10-CM | POA: Diagnosis not present

## 2014-03-17 DIAGNOSIS — Z4789 Encounter for other orthopedic aftercare: Secondary | ICD-10-CM | POA: Diagnosis not present

## 2014-03-17 DIAGNOSIS — M5137 Other intervertebral disc degeneration, lumbosacral region: Secondary | ICD-10-CM | POA: Diagnosis not present

## 2014-03-17 DIAGNOSIS — E119 Type 2 diabetes mellitus without complications: Secondary | ICD-10-CM | POA: Diagnosis not present

## 2014-03-17 DIAGNOSIS — Z96659 Presence of unspecified artificial knee joint: Secondary | ICD-10-CM | POA: Diagnosis not present

## 2014-03-17 DIAGNOSIS — J441 Chronic obstructive pulmonary disease with (acute) exacerbation: Secondary | ICD-10-CM | POA: Diagnosis not present

## 2014-03-17 DIAGNOSIS — G609 Hereditary and idiopathic neuropathy, unspecified: Secondary | ICD-10-CM | POA: Diagnosis not present

## 2014-03-22 DIAGNOSIS — Z96659 Presence of unspecified artificial knee joint: Secondary | ICD-10-CM | POA: Diagnosis not present

## 2014-03-22 DIAGNOSIS — Z4789 Encounter for other orthopedic aftercare: Secondary | ICD-10-CM | POA: Diagnosis not present

## 2014-03-22 DIAGNOSIS — M5137 Other intervertebral disc degeneration, lumbosacral region: Secondary | ICD-10-CM | POA: Diagnosis not present

## 2014-03-22 DIAGNOSIS — G609 Hereditary and idiopathic neuropathy, unspecified: Secondary | ICD-10-CM | POA: Diagnosis not present

## 2014-03-22 DIAGNOSIS — E119 Type 2 diabetes mellitus without complications: Secondary | ICD-10-CM | POA: Diagnosis not present

## 2014-03-22 DIAGNOSIS — J441 Chronic obstructive pulmonary disease with (acute) exacerbation: Secondary | ICD-10-CM | POA: Diagnosis not present

## 2014-03-23 DIAGNOSIS — Z96659 Presence of unspecified artificial knee joint: Secondary | ICD-10-CM | POA: Diagnosis not present

## 2014-03-23 DIAGNOSIS — Z4789 Encounter for other orthopedic aftercare: Secondary | ICD-10-CM | POA: Diagnosis not present

## 2014-03-23 DIAGNOSIS — G609 Hereditary and idiopathic neuropathy, unspecified: Secondary | ICD-10-CM | POA: Diagnosis not present

## 2014-03-23 DIAGNOSIS — E119 Type 2 diabetes mellitus without complications: Secondary | ICD-10-CM | POA: Diagnosis not present

## 2014-03-23 DIAGNOSIS — M5137 Other intervertebral disc degeneration, lumbosacral region: Secondary | ICD-10-CM | POA: Diagnosis not present

## 2014-03-23 DIAGNOSIS — J441 Chronic obstructive pulmonary disease with (acute) exacerbation: Secondary | ICD-10-CM | POA: Diagnosis not present

## 2014-03-25 DIAGNOSIS — Z4789 Encounter for other orthopedic aftercare: Secondary | ICD-10-CM | POA: Diagnosis not present

## 2014-03-25 DIAGNOSIS — E119 Type 2 diabetes mellitus without complications: Secondary | ICD-10-CM | POA: Diagnosis not present

## 2014-03-25 DIAGNOSIS — M5137 Other intervertebral disc degeneration, lumbosacral region: Secondary | ICD-10-CM | POA: Diagnosis not present

## 2014-03-25 DIAGNOSIS — Z96659 Presence of unspecified artificial knee joint: Secondary | ICD-10-CM | POA: Diagnosis not present

## 2014-03-25 DIAGNOSIS — G609 Hereditary and idiopathic neuropathy, unspecified: Secondary | ICD-10-CM | POA: Diagnosis not present

## 2014-03-25 DIAGNOSIS — J441 Chronic obstructive pulmonary disease with (acute) exacerbation: Secondary | ICD-10-CM | POA: Diagnosis not present

## 2014-03-26 DIAGNOSIS — Z4789 Encounter for other orthopedic aftercare: Secondary | ICD-10-CM | POA: Diagnosis not present

## 2014-03-26 DIAGNOSIS — E119 Type 2 diabetes mellitus without complications: Secondary | ICD-10-CM | POA: Diagnosis not present

## 2014-03-26 DIAGNOSIS — J441 Chronic obstructive pulmonary disease with (acute) exacerbation: Secondary | ICD-10-CM | POA: Diagnosis not present

## 2014-03-26 DIAGNOSIS — Z96659 Presence of unspecified artificial knee joint: Secondary | ICD-10-CM | POA: Diagnosis not present

## 2014-03-26 DIAGNOSIS — G609 Hereditary and idiopathic neuropathy, unspecified: Secondary | ICD-10-CM | POA: Diagnosis not present

## 2014-03-26 DIAGNOSIS — M5137 Other intervertebral disc degeneration, lumbosacral region: Secondary | ICD-10-CM | POA: Diagnosis not present

## 2014-03-29 DIAGNOSIS — Z4789 Encounter for other orthopedic aftercare: Secondary | ICD-10-CM | POA: Diagnosis not present

## 2014-03-29 DIAGNOSIS — E119 Type 2 diabetes mellitus without complications: Secondary | ICD-10-CM | POA: Diagnosis not present

## 2014-03-29 DIAGNOSIS — M5137 Other intervertebral disc degeneration, lumbosacral region: Secondary | ICD-10-CM | POA: Diagnosis not present

## 2014-03-29 DIAGNOSIS — G609 Hereditary and idiopathic neuropathy, unspecified: Secondary | ICD-10-CM | POA: Diagnosis not present

## 2014-03-29 DIAGNOSIS — Z96659 Presence of unspecified artificial knee joint: Secondary | ICD-10-CM | POA: Diagnosis not present

## 2014-03-29 DIAGNOSIS — J441 Chronic obstructive pulmonary disease with (acute) exacerbation: Secondary | ICD-10-CM | POA: Diagnosis not present

## 2014-04-01 DIAGNOSIS — Z96659 Presence of unspecified artificial knee joint: Secondary | ICD-10-CM | POA: Diagnosis not present

## 2014-04-01 DIAGNOSIS — Z471 Aftercare following joint replacement surgery: Secondary | ICD-10-CM | POA: Diagnosis not present

## 2014-04-02 DIAGNOSIS — M5137 Other intervertebral disc degeneration, lumbosacral region: Secondary | ICD-10-CM | POA: Diagnosis not present

## 2014-04-02 DIAGNOSIS — E119 Type 2 diabetes mellitus without complications: Secondary | ICD-10-CM | POA: Diagnosis not present

## 2014-04-02 DIAGNOSIS — Z96659 Presence of unspecified artificial knee joint: Secondary | ICD-10-CM | POA: Diagnosis not present

## 2014-04-02 DIAGNOSIS — Z4789 Encounter for other orthopedic aftercare: Secondary | ICD-10-CM | POA: Diagnosis not present

## 2014-04-02 DIAGNOSIS — J441 Chronic obstructive pulmonary disease with (acute) exacerbation: Secondary | ICD-10-CM | POA: Diagnosis not present

## 2014-04-02 DIAGNOSIS — G609 Hereditary and idiopathic neuropathy, unspecified: Secondary | ICD-10-CM | POA: Diagnosis not present

## 2014-04-06 DIAGNOSIS — R142 Eructation: Secondary | ICD-10-CM | POA: Diagnosis not present

## 2014-04-06 DIAGNOSIS — R141 Gas pain: Secondary | ICD-10-CM | POA: Diagnosis not present

## 2014-04-06 DIAGNOSIS — K5909 Other constipation: Secondary | ICD-10-CM | POA: Diagnosis not present

## 2014-04-08 DIAGNOSIS — M9981 Other biomechanical lesions of cervical region: Secondary | ICD-10-CM | POA: Diagnosis not present

## 2014-04-08 DIAGNOSIS — R269 Unspecified abnormalities of gait and mobility: Secondary | ICD-10-CM | POA: Diagnosis not present

## 2014-04-08 DIAGNOSIS — Z471 Aftercare following joint replacement surgery: Secondary | ICD-10-CM | POA: Diagnosis not present

## 2014-04-08 DIAGNOSIS — M5412 Radiculopathy, cervical region: Secondary | ICD-10-CM | POA: Diagnosis not present

## 2014-04-09 DIAGNOSIS — R269 Unspecified abnormalities of gait and mobility: Secondary | ICD-10-CM | POA: Diagnosis not present

## 2014-04-09 DIAGNOSIS — Z471 Aftercare following joint replacement surgery: Secondary | ICD-10-CM | POA: Diagnosis not present

## 2014-04-12 DIAGNOSIS — R269 Unspecified abnormalities of gait and mobility: Secondary | ICD-10-CM | POA: Diagnosis not present

## 2014-04-12 DIAGNOSIS — Z471 Aftercare following joint replacement surgery: Secondary | ICD-10-CM | POA: Diagnosis not present

## 2014-04-22 DIAGNOSIS — G43709 Chronic migraine without aura, not intractable, without status migrainosus: Secondary | ICD-10-CM | POA: Diagnosis not present

## 2014-04-26 DIAGNOSIS — M5137 Other intervertebral disc degeneration, lumbosacral region: Secondary | ICD-10-CM | POA: Diagnosis not present

## 2014-04-26 DIAGNOSIS — M546 Pain in thoracic spine: Secondary | ICD-10-CM | POA: Diagnosis not present

## 2014-04-26 DIAGNOSIS — Z5181 Encounter for therapeutic drug level monitoring: Secondary | ICD-10-CM | POA: Diagnosis not present

## 2014-04-26 DIAGNOSIS — Z471 Aftercare following joint replacement surgery: Secondary | ICD-10-CM | POA: Diagnosis not present

## 2014-04-26 DIAGNOSIS — M533 Sacrococcygeal disorders, not elsewhere classified: Secondary | ICD-10-CM | POA: Diagnosis not present

## 2014-04-26 DIAGNOSIS — R269 Unspecified abnormalities of gait and mobility: Secondary | ICD-10-CM | POA: Diagnosis not present

## 2014-04-26 DIAGNOSIS — G894 Chronic pain syndrome: Secondary | ICD-10-CM | POA: Diagnosis not present

## 2014-04-26 DIAGNOSIS — Z79899 Other long term (current) drug therapy: Secondary | ICD-10-CM | POA: Diagnosis not present

## 2014-04-29 DIAGNOSIS — Z96659 Presence of unspecified artificial knee joint: Secondary | ICD-10-CM | POA: Diagnosis not present

## 2014-04-29 DIAGNOSIS — M1710 Unilateral primary osteoarthritis, unspecified knee: Secondary | ICD-10-CM | POA: Diagnosis not present

## 2014-04-29 DIAGNOSIS — Z471 Aftercare following joint replacement surgery: Secondary | ICD-10-CM | POA: Diagnosis not present

## 2014-04-30 DIAGNOSIS — M545 Low back pain, unspecified: Secondary | ICD-10-CM | POA: Diagnosis not present

## 2014-05-03 DIAGNOSIS — E039 Hypothyroidism, unspecified: Secondary | ICD-10-CM | POA: Diagnosis not present

## 2014-05-03 DIAGNOSIS — E559 Vitamin D deficiency, unspecified: Secondary | ICD-10-CM | POA: Diagnosis not present

## 2014-05-03 DIAGNOSIS — IMO0001 Reserved for inherently not codable concepts without codable children: Secondary | ICD-10-CM | POA: Diagnosis not present

## 2014-05-03 DIAGNOSIS — E7800 Pure hypercholesterolemia, unspecified: Secondary | ICD-10-CM | POA: Diagnosis not present

## 2014-05-15 ENCOUNTER — Emergency Department (HOSPITAL_COMMUNITY): Payer: Medicare Other

## 2014-05-15 ENCOUNTER — Encounter (HOSPITAL_COMMUNITY): Payer: Self-pay | Admitting: Emergency Medicine

## 2014-05-15 ENCOUNTER — Emergency Department (HOSPITAL_COMMUNITY)
Admission: EM | Admit: 2014-05-15 | Discharge: 2014-05-15 | Disposition: A | Discharge status: Home / Self Care | Payer: Medicare Other | Attending: Emergency Medicine | Admitting: Emergency Medicine

## 2014-05-15 DIAGNOSIS — M25569 Pain in unspecified knee: Secondary | ICD-10-CM | POA: Diagnosis not present

## 2014-05-15 DIAGNOSIS — M545 Low back pain, unspecified: Secondary | ICD-10-CM | POA: Diagnosis not present

## 2014-05-15 DIAGNOSIS — Z860100 Personal history of colon polyps, unspecified: Secondary | ICD-10-CM | POA: Insufficient documentation

## 2014-05-15 DIAGNOSIS — IMO0002 Reserved for concepts with insufficient information to code with codable children: Secondary | ICD-10-CM | POA: Insufficient documentation

## 2014-05-15 DIAGNOSIS — Y9389 Activity, other specified: Secondary | ICD-10-CM | POA: Insufficient documentation

## 2014-05-15 DIAGNOSIS — S99919A Unspecified injury of unspecified ankle, initial encounter: Principal | ICD-10-CM

## 2014-05-15 DIAGNOSIS — S8990XA Unspecified injury of unspecified lower leg, initial encounter: Secondary | ICD-10-CM | POA: Insufficient documentation

## 2014-05-15 DIAGNOSIS — M25539 Pain in unspecified wrist: Secondary | ICD-10-CM | POA: Diagnosis not present

## 2014-05-15 DIAGNOSIS — E0590 Thyrotoxicosis, unspecified without thyrotoxic crisis or storm: Secondary | ICD-10-CM | POA: Diagnosis not present

## 2014-05-15 DIAGNOSIS — E119 Type 2 diabetes mellitus without complications: Secondary | ICD-10-CM | POA: Insufficient documentation

## 2014-05-15 DIAGNOSIS — Z79899 Other long term (current) drug therapy: Secondary | ICD-10-CM | POA: Diagnosis not present

## 2014-05-15 DIAGNOSIS — M25562 Pain in left knee: Secondary | ICD-10-CM

## 2014-05-15 DIAGNOSIS — S199XXA Unspecified injury of neck, initial encounter: Secondary | ICD-10-CM | POA: Diagnosis not present

## 2014-05-15 DIAGNOSIS — S6990XA Unspecified injury of unspecified wrist, hand and finger(s), initial encounter: Secondary | ICD-10-CM | POA: Diagnosis not present

## 2014-05-15 DIAGNOSIS — Z87891 Personal history of nicotine dependence: Secondary | ICD-10-CM | POA: Insufficient documentation

## 2014-05-15 DIAGNOSIS — S59919A Unspecified injury of unspecified forearm, initial encounter: Secondary | ICD-10-CM | POA: Diagnosis not present

## 2014-05-15 DIAGNOSIS — W108XXA Fall (on) (from) other stairs and steps, initial encounter: Secondary | ICD-10-CM | POA: Diagnosis not present

## 2014-05-15 DIAGNOSIS — Z880 Allergy status to penicillin: Secondary | ICD-10-CM | POA: Insufficient documentation

## 2014-05-15 DIAGNOSIS — G43909 Migraine, unspecified, not intractable, without status migrainosus: Secondary | ICD-10-CM | POA: Insufficient documentation

## 2014-05-15 DIAGNOSIS — S59909A Unspecified injury of unspecified elbow, initial encounter: Secondary | ICD-10-CM | POA: Diagnosis not present

## 2014-05-15 DIAGNOSIS — S99929A Unspecified injury of unspecified foot, initial encounter: Principal | ICD-10-CM

## 2014-05-15 DIAGNOSIS — K219 Gastro-esophageal reflux disease without esophagitis: Secondary | ICD-10-CM | POA: Diagnosis not present

## 2014-05-15 DIAGNOSIS — Z86711 Personal history of pulmonary embolism: Secondary | ICD-10-CM | POA: Insufficient documentation

## 2014-05-15 DIAGNOSIS — J9819 Other pulmonary collapse: Secondary | ICD-10-CM | POA: Diagnosis not present

## 2014-05-15 DIAGNOSIS — M542 Cervicalgia: Secondary | ICD-10-CM | POA: Diagnosis not present

## 2014-05-15 DIAGNOSIS — Y92009 Unspecified place in unspecified non-institutional (private) residence as the place of occurrence of the external cause: Secondary | ICD-10-CM | POA: Insufficient documentation

## 2014-05-15 DIAGNOSIS — Z862 Personal history of diseases of the blood and blood-forming organs and certain disorders involving the immune mechanism: Secondary | ICD-10-CM | POA: Insufficient documentation

## 2014-05-15 DIAGNOSIS — S0993XA Unspecified injury of face, initial encounter: Secondary | ICD-10-CM | POA: Diagnosis not present

## 2014-05-15 DIAGNOSIS — R0602 Shortness of breath: Secondary | ICD-10-CM | POA: Diagnosis not present

## 2014-05-15 DIAGNOSIS — W19XXXA Unspecified fall, initial encounter: Secondary | ICD-10-CM

## 2014-05-15 LAB — CBC WITH DIFFERENTIAL/PLATELET
Basophils Absolute: 0 10*3/uL (ref 0.0–0.1)
Basophils Relative: 0 % (ref 0–1)
Eosinophils Absolute: 0.3 10*3/uL (ref 0.0–0.7)
Eosinophils Relative: 4 % (ref 0–5)
HCT: 38.6 % (ref 36.0–46.0)
Hemoglobin: 11.7 g/dL — ABNORMAL LOW (ref 12.0–15.0)
Lymphocytes Relative: 29 % (ref 12–46)
Lymphs Abs: 1.8 10*3/uL (ref 0.7–4.0)
MCH: 24.3 pg — ABNORMAL LOW (ref 26.0–34.0)
MCHC: 30.3 g/dL (ref 30.0–36.0)
MCV: 80.2 fL (ref 78.0–100.0)
Monocytes Absolute: 0.4 10*3/uL (ref 0.1–1.0)
Monocytes Relative: 7 % (ref 3–12)
Neutro Abs: 3.8 10*3/uL (ref 1.7–7.7)
Neutrophils Relative %: 60 % (ref 43–77)
Platelets: 310 10*3/uL (ref 150–400)
RBC: 4.81 MIL/uL (ref 3.87–5.11)
RDW: 15.2 % (ref 11.5–15.5)
WBC: 6.3 10*3/uL (ref 4.0–10.5)

## 2014-05-15 LAB — BASIC METABOLIC PANEL
Anion gap: 12 (ref 5–15)
BUN: 8 mg/dL (ref 6–23)
CO2: 26 mEq/L (ref 19–32)
Calcium: 9.8 mg/dL (ref 8.4–10.5)
Chloride: 100 mEq/L (ref 96–112)
Creatinine, Ser: 0.66 mg/dL (ref 0.50–1.10)
GFR calc Af Amer: >90 mL/min (ref >90)
GFR calc non Af Amer: 88 mL/min — ABNORMAL LOW (ref >90)
Glucose, Bld: 100 mg/dL — ABNORMAL HIGH (ref 70–99)
Potassium: 4 mEq/L (ref 3.7–5.3)
Sodium: 138 mEq/L (ref 137–147)

## 2014-05-15 LAB — I-STAT TROPONIN, ED: Troponin i, poc: 0 ng/mL (ref 0.00–0.08)

## 2014-05-15 MED ORDER — FENTANYL CITRATE 0.05 MG/ML IJ SOLN
50.0000 ug | Freq: Once | INTRAMUSCULAR | Status: AC
  Administered 2014-05-15: 50 ug via INTRAVENOUS
  Filled 2014-05-15: qty 2

## 2014-05-15 MED ORDER — IOHEXOL 350 MG/ML SOLN
100.0000 mL | Freq: Once | INTRAVENOUS | Status: AC | PRN
  Administered 2014-05-15: 100 mL via INTRAVENOUS

## 2014-05-15 MED ORDER — HYDROCODONE-ACETAMINOPHEN 5-325 MG PO TABS
1.0000 | ORAL_TABLET | Freq: Once | ORAL | Status: AC
  Administered 2014-05-15: 1 via ORAL
  Filled 2014-05-15: qty 1

## 2014-05-15 MED ORDER — HYDROCODONE-ACETAMINOPHEN 5-325 MG PO TABS
2.0000 | ORAL_TABLET | Freq: Once | ORAL | Status: AC
  Administered 2014-05-15: 2 via ORAL
  Filled 2014-05-15: qty 2

## 2014-05-15 NOTE — ED Notes (Signed)
Pt from home c/o fall this am. Pt missed two steps up into living room, twisted her ankle and hurt her knee that has been replaced. Pt c/o L knee pain that is a replacement, neck pain, back pain, HA and L hand pain. Pt reports that she fell onto carpet. Pt did not hit head but sts that the jar of the fall made her neck and head hurt. Pt called her orhtopedic MD and was told to come her for eval. Pt is A&O and in NAD

## 2014-05-15 NOTE — ED Notes (Signed)
Patient ambulated without O2 per PA. On O2 monitor while ambulating. SpO2 saturations dropping to 79% and 80%. PA aware. Will place on O2 immediately upon arriving back to room. Patient does wear O2 at night.

## 2014-05-15 NOTE — ED Notes (Signed)
Patient transported to CT 

## 2014-05-15 NOTE — ED Provider Notes (Signed)
Medical screening examination/treatment/procedure(s) were conducted as a shared visit with non-physician practitioner(s) and myself.  I personally evaluated the patient during the encounter.   EKG Interpretation   Date/Time:  Saturday May 15 2014 18:40:58 EDT Ventricular Rate:  77 PR Interval:  179 QRS Duration: 82 QT Interval:  384 QTC Calculation: 435 R Axis:   38 Text Interpretation:  Sinus rhythm Low voltage, precordial leads No  significant change since last tracing Confirmed by Mieka Leaton  MD, Betsy Rosello  ([id number]) on 05/15/2014 6:50:50 PM     Pt here after having a mechanical fall at home injuring her left knee. Patient notes some dyspnea although she is on oxygen at home chronically at night at 2 L. X-ray results of her knee reviewed. Patient was hypoxemic when she ambulated here. She does have a history of PE and will obtain a chest CT to rule out PE  Leota Jacobsen, MD 05/15/14 [phone]

## 2014-05-15 NOTE — ED Notes (Signed)
Pt able to ambulate with SpO2 from 84% to 92% on 2-3 L O2. Patient appears in NAD. Ambulatory with steady gait. PA made aware.

## 2014-05-15 NOTE — Discharge Instructions (Signed)
Arthralgia °Your caregiver has diagnosed you as suffering from an arthralgia. Arthralgia means there is pain in a joint. This can come from many reasons including: °· Bruising the joint which causes soreness (inflammation) in the joint. °· Wear and tear on the joints which occur as we grow older (osteoarthritis). °· Overusing the joint. °· Various forms of arthritis. °· Infections of the joint. °Regardless of the cause of pain in your joint, most of these different pains respond to anti-inflammatory drugs and rest. The exception to this is when a joint is infected, and these cases are treated with antibiotics, if it is a bacterial infection. °HOME CARE INSTRUCTIONS  °· Rest the injured area for as long as directed by your caregiver. Then slowly start using the joint as directed by your caregiver and as the pain allows. Crutches as directed may be useful if the ankles, knees or hips are involved. If the knee was splinted or casted, continue use and care as directed. If an stretchy or elastic wrapping bandage has been applied today, it should be removed and re-applied every 3 to 4 hours. It should not be applied tightly, but firmly enough to keep swelling down. Watch toes and feet for swelling, bluish discoloration, coldness, numbness or excessive pain. If any of these problems (symptoms) occur, remove the ace bandage and re-apply more loosely. If these symptoms persist, contact your caregiver or return to this location. °· For the first 24 hours, keep the injured extremity elevated on pillows while lying down. °· Apply ice for 15-20 minutes to the sore joint every couple hours while awake for the first half day. Then 03-04 times per day for the first 48 hours. Put the ice in a plastic bag and place a towel between the bag of ice and your skin. °· Wear any splinting, casting, elastic bandage applications, or slings as instructed. °· Only take over-the-counter or prescription medicines for pain, discomfort, or fever as  directed by your caregiver. Do not use aspirin immediately after the injury unless instructed by your physician. Aspirin can cause increased bleeding and bruising of the tissues. °· If you were given crutches, continue to use them as instructed and do not resume weight bearing on the sore joint until instructed. °Persistent pain and inability to use the sore joint as directed for more than 2 to 3 days are warning signs indicating that you should see a caregiver for a follow-up visit as soon as possible. Initially, a hairline fracture (break in bone) may not be evident on X-rays. Persistent pain and swelling indicate that further evaluation, non-weight bearing or use of the joint (use of crutches or slings as instructed), or further X-rays are indicated. X-rays may sometimes not show a small fracture until a week or 10 days later. Make a follow-up appointment with your own caregiver or one to whom we have referred you. A radiologist (specialist in reading X-rays) may read your X-rays. Make sure you know how you are to obtain your X-ray results. Do not assume everything is normal if you do not hear from us. °SEEK MEDICAL CARE IF: °Bruising, swelling, or pain increases. °SEEK IMMEDIATE MEDICAL CARE IF:  °· Your fingers or toes are numb or blue. °· The pain is not responding to medications and continues to stay the same or get worse. °· The pain in your joint becomes severe. °· You develop a fever over 102° F (38.9° C). °· It becomes impossible to move or use the joint. °MAKE SURE YOU:  °·   Understand these instructions.  Will watch your condition.  Will get help right away if you are not doing well or get worse. Document Released: 09/03/2005 Document Revised: 11/26/2011 Document Reviewed: 04/21/2008 [facility] Patient Information 2015 [place], [place]. This information is not intended to replace advice given to you by your health care provider. Make sure you discuss any questions you have with your health care  provider. Shortness of Breath Shortness of breath means you have trouble breathing. It could also mean that you have a medical problem. You should get immediate medical care for shortness of breath. CAUSES   Not enough oxygen in the air such as with high altitudes or a smoke-filled room.  Certain lung diseases, infections, or problems.  Heart disease or conditions, such as angina or heart failure.  Low red blood cells (anemia).  Poor physical fitness, which can cause shortness of breath when you exercise.  Chest or back injuries or stiffness.  Being overweight.  Smoking.  Anxiety, which can make you feel like you are not getting enough air. DIAGNOSIS  Serious medical problems can often be found during your physical exam. Tests may also be done to determine why you are having shortness of breath. Tests may include:  Chest X-rays.  Lung function tests.  Blood tests.  An electrocardiogram (ECG).  An ambulatory electrocardiogram. An ambulatory ECG records your heartbeat patterns over a 24-hour period.  Exercise testing.  A transthoracic echocardiogram (TTE). During echocardiography, sound waves are used to evaluate how blood flows through your heart.  A transesophageal echocardiogram (TEE).  Imaging scans. Your health care provider may not be able to find a cause for your shortness of breath after your exam. In this case, it is important to have a follow-up exam with your health care provider as directed.  TREATMENT  Treatment for shortness of breath depends on the cause of your symptoms and can vary greatly. HOME CARE INSTRUCTIONS   Do not smoke. Smoking is a common cause of shortness of breath. If you smoke, ask for help to quit.  Avoid being around chemicals or things that may bother your breathing, such as paint fumes and dust.  Rest as needed. Slowly resume your usual activities.  If medicines were prescribed, take them as directed for the full length of time  directed. This includes oxygen and any inhaled medicines.  Keep all follow-up appointments as directed by your health care provider. SEEK MEDICAL CARE IF:   Your condition does not improve in the time expected.  You have a hard time doing your normal activities even with rest.  You have any new symptoms. SEEK IMMEDIATE MEDICAL CARE IF:   Your shortness of breath gets worse.  You feel light-headed, faint, or develop a cough not controlled with medicines.  You start coughing up blood.  You have pain with breathing.  You have chest pain or pain in your arms, shoulders, or abdomen.  You have a fever.  You are unable to walk up stairs or exercise the way you normally do. MAKE SURE YOU:  Understand these instructions.  Will watch your condition.  Will get help right away if you are not doing well or get worse. Document Released: 05/29/2001 Document Revised: 09/08/2013 Document Reviewed: 11/19/2011 [facility] Patient Information 2015 [place], [place]. This information is not intended to replace advice given to you by your health care provider. Make sure you discuss any questions you have with your health care provider.

## 2014-05-15 NOTE — ED Provider Notes (Signed)
CSN: [id number]     Arrival date & time 05/15/14  1345 History   First MD Initiated Contact with Patient 05/15/14 1512     Chief Complaint  Patient presents with  . Fall  . Knee Pain  . Hand Pain  . Back Pain  . Neck Pain     (Consider location/radiation/quality/duration/timing/severity/associated sxs/prior Treatment) HPI Comments: Patient presents to the ED with a chief complaint of fall.  She states that she tripped and fell down one stair in her home.  She states that she hit her knee and her wrist.  Sh also complains of mild back and neck pain.  She did not hit her head.  Her pain is mild to moderate.  She was able to ambulate after the fall.  She states that she recently had a TKA on June 10th by Dr. Maureen Ralphs, whom she called and was told to come to the ED to be seen by Dr. Tonita Cong.  She has not taken anything to alleviate her symptoms.  The pain radiates from the knee to the hip.  The history is provided by the patient. No language interpreter was used.    Past Medical History  Diagnosis Date  . Hyperthyroidism   . Toxic goiter   . Reflux   . Colon polyps   . Migraines   . 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 Obesity, Class III, BMI 40-49.9 (morbid obesity) 04/27/2010    02-17-14 reports some weight loss- intentional  . Pelvic pain     02-17-14 "states thinks its back related on left groin"  . Anemia   . Clotting disorder 02-17-14    S/p appendectomy-developed Pulmonary emboli-tx. warfarin-d/c 1 yr ago.  [name] Neuromuscular disorder     hands and throat"spasmodic dysphonia"  . Osteoarthritis     in back(chronic pain)  . Pulmonary emboli     s/p Appendectomy '13 [facility]  . Diabetes mellitus without complication     "borderline"- oral med  . Right knee pain    Past Surgical History  Procedure Laterality Date  . Abdominal hysterectomy    . [place] diverticulum    . Kidney stone surgery    . Laparoscopic appendectomy  11/27/2011    Procedure: APPENDECTOMY LAPAROSCOPIC;  Surgeon: Adin Hector, MD;  Location: [facility];  Service: General;  Laterality: N/A;  . Cholecystectomy    . Knee arthroscopy  1998    right  . Hand surgery  2004    both hands  . Botox injection      for migranes  . Cataract extraction, bilateral Bilateral 02-17-14  . Vocal cord surgery  02-17-14    01-12-14 done in [place] [facility](s/p selective denervation/reinnervation recurrent laryngeal nerve surgery)  . Appendectomy      3'13 [facility] s/p developed  Pulmonary Emboli  . Total knee revision Left 02/24/2014    Procedure: LEFT TOTAL KNEE REVISION;  Surgeon: Gearlean Alf, MD;  Location: [facility];  Service: Orthopedics;  Laterality: Left;   Family History  Problem Relation Age of Onset  . Heart disease Mother    History  Substance Use Topics  . Smoking status: Former Smoker    Quit date: 01/03/1982  . Smokeless tobacco: Never Used  . Alcohol Use: No   OB History   Grav Para Term Preterm Abortions TAB SAB Ect Mult Living                 Review  of Systems  Musculoskeletal: Positive for arthralgias, back pain and joint swelling.  All other systems reviewed and are negative.     Allergies  Nutritional supplements; Bee venom; Etodolac; Morphine; Propoxyphene n-acetaminophen; and Penicillins  Home Medications   Prior to Admission medications   Medication Sig Start Date End Date Taking? Authorizing Provider  albuterol (PROVENTIL HFA;VENTOLIN HFA) 108 (90 BASE) MCG/ACT inhaler Inhale 1 puff into the lungs every 6 (six) hours as needed for wheezing or shortness of breath.    Historical Provider, MD  albuterol (PROVENTIL) (2.5 MG/3ML) 0.083% nebulizer solution Take 2.5 mg by nebulization every 6 (six) hours as needed for wheezing or shortness of breath.    Historical Provider, MD  benzonatate (TESSALON) 100 MG capsule Take 100 mg by mouth 3 (three) times daily as needed for cough.    Historical Provider, MD  cetirizine (ZYRTEC) 10 MG tablet Take  10 mg by mouth daily.    Historical Provider, MD  clonazePAM (KLONOPIN) 0.5 MG tablet Take 0.5 mg by mouth at bedtime.    Historical Provider, MD  diclofenac (FLECTOR) 1.3 % PTCH Place 1 patch onto the skin daily as needed (for pain). Apply to back    Historical Provider, MD  docusate sodium 100 MG CAPS Take 100 mg by mouth 2 (two) times daily. 02/25/14   Amber Renelda Loma, PA-C  DULoxetine (CYMBALTA) 60 MG capsule Take 60 mg by mouth every morning.     Historical Provider, MD  EPINEPHrine (EPIPEN) 0.3 mg/0.3 mL DEVI Inject 0.3 mg into the muscle once.    Historical Provider, MD  gabapentin (NEURONTIN) 800 MG tablet Take 800 mg by mouth 2 (two) times daily.    Historical Provider, MD  HYDROcodone-acetaminophen (NORCO/VICODIN) 5-325 MG per tablet Take two tablets by mouth every four hours for pain 03/09/14   Blanchie Serve, MD  HYDROmorphone (DILAUDID) 2 MG tablet Take 1-2 tablets (2-4 mg total) by mouth every 4 (four) hours as needed for severe pain. 02/25/14   Amber Renelda Loma, PA-C  ketorolac (TORADOL) 10 MG tablet Take 10 mg by mouth every 6 (six) hours as needed (for headache).    Historical Provider, MD  levothyroxine (SYNTHROID, LEVOTHROID) 25 MCG tablet Take 25 mcg by mouth every morning.     Historical Provider, MD  methocarbamol (ROBAXIN) 500 MG tablet Take 1 tablet (500 mg total) by mouth every 6 (six) hours as needed for muscle spasms. 02/25/14   Amber Lauren Constable, PA-C  mometasone-formoterol (DULERA) 100-5 MCG/ACT AERO Inhale 2 puffs into the lungs 2 (two) times daily.    Historical Provider, MD  ondansetron (ZOFRAN) 8 MG tablet Take 8 mg by mouth every 8 (eight) hours as needed for nausea or vomiting.    Historical Provider, MD  pantoprazole (PROTONIX) 40 MG tablet Take 40 mg by mouth daily.    Historical Provider, MD  primidone (MYSOLINE) 50 MG tablet Take 50 mg by mouth at bedtime.    Historical Provider, MD  rivaroxaban (XARELTO) 10 MG TABS tablet Take 1 tablet (10 mg  total) by mouth daily with breakfast. 02/25/14   Amber Renelda Loma, PA-C  rizatriptan (MAXALT) 10 MG tablet Take 10 mg by mouth as needed for migraine (migraine). May repeat in 2 hours if needed    Historical Provider, MD  sitaGLIPtin (JANUVIA) 100 MG tablet Take 100 mg by mouth daily. Takes at night.    Historical Provider, MD   BP 119/69  Pulse 82  Temp(Src) 97.9 F (36.6 C) (Oral)  Resp  20  SpO2 92% Physical Exam  Nursing note and vitals reviewed. Constitutional: She is oriented to person, place, and time. She appears well-developed and well-nourished.  HENT:  Head: Normocephalic and atraumatic.  Eyes: Conjunctivae and EOM are normal. Pupils are equal, round, and reactive to light.  Neck: Normal range of motion. Neck supple.  Cardiovascular: Normal rate and regular rhythm.  Exam reveals no gallop and no friction rub.   No murmur heard. Pulmonary/Chest: Effort normal and breath sounds normal. No respiratory distress. She has no wheezes. She has no rales. She exhibits no tenderness.  Abdominal: Soft. She exhibits no distension and no mass. There is no tenderness. There is no rebound and no guarding.  Musculoskeletal: Normal range of motion. She exhibits no edema and no tenderness.  Right knee ROM and strength 4/5, moderately ttp over the medial aspect, no bony abnormality or deformity  Right hip ROM and strength 5/5, no bony abnormality or deformity, mildly ttp  Remaining extremities have 5/5 ROM and strength, without bony abnormality or deformity  No CTLS spine tenderness, step-off, or deformity  Neurological: She is alert and oriented to person, place, and time.  Skin: Skin is warm and dry.  Right knee is slightly erythematous (started after the fall)  Psychiatric: She has a normal mood and affect. Her behavior is normal. Judgment and thought content normal.    ED Course  Procedures (including critical care time) Labs Review Labs Reviewed - No data to display  Imaging  Review Dg Wrist Complete Left  05/15/2014   CLINICAL DATA:  Fall.  Pain.  EXAM: LEFT WRIST - COMPLETE 3+ VIEW  COMPARISON:  None.  FINDINGS: Old ulnar styloid fracture. No acute fracture, subluxation or dislocation. Degenerative changes at the first carpometacarpal joint. Soft tissues are intact.  IMPRESSION: No acute bony abnormality.   Electronically Signed   By: Rolm Baptise M.D.   On: 05/15/2014 15:02   Dg Knee Complete 4 Views Left  05/15/2014   CLINICAL DATA:  Fall.  Knee pain.  EXAM: LEFT KNEE - COMPLETE 4+ VIEW  COMPARISON:  None.  FINDINGS: Prior left knee replacement. No hardware or bony complicating feature. Small joint effusion suspected. No fracture, subluxation or dislocation.  IMPRESSION: Left knee replacement. No acute bony abnormality. Small joint effusion.   Electronically Signed   By: Rolm Baptise M.D.   On: 05/15/2014 15:03     EKG Interpretation None      MDM   Final diagnoses:  Left knee pain  Fall, initial encounter    Patient with fall and recent knee replacement.  Told to come to the ED by ortho and see on-call ortho.    Plain films are negative, except for possible small effusion in knee.  Patient ambulates, but O2 drops to 80s without oxygen.  Nurse asked regarding ambulation with oxygen, but due to misunderstanding, patient ambulated without oxygen.  Nevertheless, it appears that the patient has a new oxygen requirement.  Patient seen by and discussed with Dr. Zenia Resides.  Will order CT angio.    9:24 PM  Imaging is reassuring.  No PE.  Imaging reviewed with Dr. Zenia Resides.  Patient ambulates again and O2 is 88-94.  Discussed with Dr. Zenia Resides, likely patient's baseline.  Will discharge to home.  Not in any apparent distress.  She is stable and ready for discharge.   Montine Circle, PA-C 05/15/14 2126

## 2014-05-18 DIAGNOSIS — L821 Other seborrheic keratosis: Secondary | ICD-10-CM | POA: Diagnosis not present

## 2014-05-18 DIAGNOSIS — R490 Dysphonia: Secondary | ICD-10-CM | POA: Diagnosis not present

## 2014-05-18 DIAGNOSIS — Z85828 Personal history of other malignant neoplasm of skin: Secondary | ICD-10-CM | POA: Diagnosis not present

## 2014-05-18 DIAGNOSIS — M546 Pain in thoracic spine: Secondary | ICD-10-CM | POA: Diagnosis not present

## 2014-05-18 DIAGNOSIS — D692 Other nonthrombocytopenic purpura: Secondary | ICD-10-CM | POA: Diagnosis not present

## 2014-05-18 DIAGNOSIS — M5137 Other intervertebral disc degeneration, lumbosacral region: Secondary | ICD-10-CM | POA: Diagnosis not present

## 2014-05-18 DIAGNOSIS — M545 Low back pain, unspecified: Secondary | ICD-10-CM | POA: Diagnosis not present

## 2014-05-18 DIAGNOSIS — L819 Disorder of pigmentation, unspecified: Secondary | ICD-10-CM | POA: Diagnosis not present

## 2014-05-18 DIAGNOSIS — M4120 Other idiopathic scoliosis, site unspecified: Secondary | ICD-10-CM | POA: Diagnosis not present

## 2014-05-19 DIAGNOSIS — M4120 Other idiopathic scoliosis, site unspecified: Secondary | ICD-10-CM | POA: Diagnosis not present

## 2014-05-19 DIAGNOSIS — IMO0002 Reserved for concepts with insufficient information to code with codable children: Secondary | ICD-10-CM | POA: Diagnosis not present

## 2014-05-19 DIAGNOSIS — M5126 Other intervertebral disc displacement, lumbar region: Secondary | ICD-10-CM | POA: Diagnosis not present

## 2014-05-19 DIAGNOSIS — M5137 Other intervertebral disc degeneration, lumbosacral region: Secondary | ICD-10-CM | POA: Diagnosis not present

## 2014-05-19 DIAGNOSIS — M47817 Spondylosis without myelopathy or radiculopathy, lumbosacral region: Secondary | ICD-10-CM | POA: Diagnosis not present

## 2014-05-19 DIAGNOSIS — M47814 Spondylosis without myelopathy or radiculopathy, thoracic region: Secondary | ICD-10-CM | POA: Diagnosis not present

## 2014-05-20 DIAGNOSIS — R05 Cough: Secondary | ICD-10-CM | POA: Diagnosis not present

## 2014-05-20 DIAGNOSIS — E559 Vitamin D deficiency, unspecified: Secondary | ICD-10-CM | POA: Diagnosis not present

## 2014-05-20 DIAGNOSIS — R059 Cough, unspecified: Secondary | ICD-10-CM | POA: Diagnosis not present

## 2014-05-20 NOTE — ED Provider Notes (Signed)
Medical screening examination/treatment/procedure(s) were performed by non-physician practitioner and as supervising physician I was immediately available for consultation/collaboration.   EKG Interpretation   Date/Time:  Saturday May 15 2014 18:40:58 EDT Ventricular Rate:  77 PR Interval:  179 QRS Duration: 82 QT Interval:  384 QTC Calculation: 435 R Axis:   38 Text Interpretation:  Sinus rhythm Low voltage, precordial leads No  significant change since last tracing Confirmed by Zenia Resides  MD, Aleda Madl  ([id number]) on 05/15/2014 6:50:50 PM       Leota Jacobsen, MD 05/20/14 1535

## 2014-05-27 DIAGNOSIS — M1710 Unilateral primary osteoarthritis, unspecified knee: Secondary | ICD-10-CM | POA: Diagnosis not present

## 2014-05-27 DIAGNOSIS — Z96659 Presence of unspecified artificial knee joint: Secondary | ICD-10-CM | POA: Diagnosis not present

## 2014-05-27 DIAGNOSIS — Z471 Aftercare following joint replacement surgery: Secondary | ICD-10-CM | POA: Diagnosis not present

## 2014-05-28 DIAGNOSIS — M545 Low back pain, unspecified: Secondary | ICD-10-CM | POA: Diagnosis not present

## 2014-05-31 DIAGNOSIS — Z471 Aftercare following joint replacement surgery: Secondary | ICD-10-CM | POA: Diagnosis not present

## 2014-05-31 DIAGNOSIS — R269 Unspecified abnormalities of gait and mobility: Secondary | ICD-10-CM | POA: Diagnosis not present

## 2014-06-09 DIAGNOSIS — J384 Edema of larynx: Secondary | ICD-10-CM | POA: Diagnosis not present

## 2014-06-09 DIAGNOSIS — R490 Dysphonia: Secondary | ICD-10-CM | POA: Diagnosis not present

## 2014-06-09 DIAGNOSIS — R0602 Shortness of breath: Secondary | ICD-10-CM | POA: Diagnosis not present

## 2014-06-09 DIAGNOSIS — J383 Other diseases of vocal cords: Secondary | ICD-10-CM | POA: Diagnosis not present

## 2014-06-09 DIAGNOSIS — J385 Laryngeal spasm: Secondary | ICD-10-CM | POA: Diagnosis not present

## 2014-06-09 DIAGNOSIS — J387 Other diseases of larynx: Secondary | ICD-10-CM | POA: Diagnosis not present

## 2014-06-11 DIAGNOSIS — M545 Low back pain, unspecified: Secondary | ICD-10-CM | POA: Diagnosis not present

## 2014-06-14 DIAGNOSIS — Z471 Aftercare following joint replacement surgery: Secondary | ICD-10-CM | POA: Diagnosis not present

## 2014-06-14 DIAGNOSIS — R269 Unspecified abnormalities of gait and mobility: Secondary | ICD-10-CM | POA: Diagnosis not present

## 2014-06-17 DIAGNOSIS — Z96652 Presence of left artificial knee joint: Secondary | ICD-10-CM | POA: Diagnosis not present

## 2014-06-17 DIAGNOSIS — Z471 Aftercare following joint replacement surgery: Secondary | ICD-10-CM | POA: Diagnosis not present

## 2014-06-17 DIAGNOSIS — R2689 Other abnormalities of gait and mobility: Secondary | ICD-10-CM | POA: Diagnosis not present

## 2014-06-25 DIAGNOSIS — J3800 Paralysis of vocal cords and larynx, unspecified: Secondary | ICD-10-CM | POA: Diagnosis not present

## 2014-06-28 DIAGNOSIS — Z01810 Encounter for preprocedural cardiovascular examination: Secondary | ICD-10-CM | POA: Diagnosis not present

## 2014-06-28 DIAGNOSIS — R000 Tachycardia, unspecified: Secondary | ICD-10-CM | POA: Diagnosis not present

## 2014-06-29 DIAGNOSIS — H04129 Dry eye syndrome of unspecified lacrimal gland: Secondary | ICD-10-CM | POA: Diagnosis not present

## 2014-06-29 DIAGNOSIS — H0289 Other specified disorders of eyelid: Secondary | ICD-10-CM | POA: Diagnosis not present

## 2014-06-29 DIAGNOSIS — M47816 Spondylosis without myelopathy or radiculopathy, lumbar region: Secondary | ICD-10-CM | POA: Diagnosis not present

## 2014-06-29 DIAGNOSIS — M179 Osteoarthritis of knee, unspecified: Secondary | ICD-10-CM | POA: Diagnosis not present

## 2014-06-29 DIAGNOSIS — M546 Pain in thoracic spine: Secondary | ICD-10-CM | POA: Diagnosis not present

## 2014-06-29 DIAGNOSIS — H0489 Other disorders of lacrimal system: Secondary | ICD-10-CM | POA: Diagnosis not present

## 2014-06-30 DIAGNOSIS — M9901 Segmental and somatic dysfunction of cervical region: Secondary | ICD-10-CM | POA: Diagnosis not present

## 2014-06-30 DIAGNOSIS — M5412 Radiculopathy, cervical region: Secondary | ICD-10-CM | POA: Diagnosis not present

## 2014-07-01 DIAGNOSIS — Z9889 Other specified postprocedural states: Secondary | ICD-10-CM | POA: Diagnosis not present

## 2014-07-01 DIAGNOSIS — Z885 Allergy status to narcotic agent status: Secondary | ICD-10-CM | POA: Diagnosis not present

## 2014-07-01 DIAGNOSIS — J383 Other diseases of vocal cords: Secondary | ICD-10-CM | POA: Diagnosis not present

## 2014-07-01 DIAGNOSIS — Z86711 Personal history of pulmonary embolism: Secondary | ICD-10-CM | POA: Diagnosis not present

## 2014-07-01 DIAGNOSIS — H2703 Aphakia, bilateral: Secondary | ICD-10-CM | POA: Diagnosis not present

## 2014-07-01 DIAGNOSIS — Z9049 Acquired absence of other specified parts of digestive tract: Secondary | ICD-10-CM | POA: Diagnosis not present

## 2014-07-01 DIAGNOSIS — J387 Other diseases of larynx: Secondary | ICD-10-CM | POA: Diagnosis not present

## 2014-07-01 DIAGNOSIS — G250 Essential tremor: Secondary | ICD-10-CM | POA: Diagnosis not present

## 2014-07-01 DIAGNOSIS — Z90710 Acquired absence of both cervix and uterus: Secondary | ICD-10-CM | POA: Diagnosis not present

## 2014-07-01 DIAGNOSIS — Z888 Allergy status to other drugs, medicaments and biological substances status: Secondary | ICD-10-CM | POA: Diagnosis not present

## 2014-07-01 DIAGNOSIS — D649 Anemia, unspecified: Secondary | ICD-10-CM | POA: Diagnosis not present

## 2014-07-01 DIAGNOSIS — Z91030 Bee allergy status: Secondary | ICD-10-CM | POA: Diagnosis not present

## 2014-07-01 DIAGNOSIS — Z96652 Presence of left artificial knee joint: Secondary | ICD-10-CM | POA: Diagnosis not present

## 2014-07-01 DIAGNOSIS — R000 Tachycardia, unspecified: Secondary | ICD-10-CM | POA: Diagnosis not present

## 2014-07-01 DIAGNOSIS — E039 Hypothyroidism, unspecified: Secondary | ICD-10-CM | POA: Diagnosis not present

## 2014-07-01 DIAGNOSIS — G8929 Other chronic pain: Secondary | ICD-10-CM | POA: Diagnosis not present

## 2014-07-01 DIAGNOSIS — F419 Anxiety disorder, unspecified: Secondary | ICD-10-CM | POA: Diagnosis not present

## 2014-07-01 DIAGNOSIS — Z87891 Personal history of nicotine dependence: Secondary | ICD-10-CM | POA: Diagnosis not present

## 2014-07-01 DIAGNOSIS — Z880 Allergy status to penicillin: Secondary | ICD-10-CM | POA: Diagnosis not present

## 2014-07-01 DIAGNOSIS — R490 Dysphonia: Secondary | ICD-10-CM | POA: Diagnosis not present

## 2014-07-01 DIAGNOSIS — E118 Type 2 diabetes mellitus with unspecified complications: Secondary | ICD-10-CM | POA: Diagnosis not present

## 2014-07-01 DIAGNOSIS — G4733 Obstructive sleep apnea (adult) (pediatric): Secondary | ICD-10-CM | POA: Diagnosis not present

## 2014-07-02 DIAGNOSIS — E118 Type 2 diabetes mellitus with unspecified complications: Secondary | ICD-10-CM | POA: Diagnosis not present

## 2014-07-02 DIAGNOSIS — G4733 Obstructive sleep apnea (adult) (pediatric): Secondary | ICD-10-CM | POA: Diagnosis not present

## 2014-07-02 DIAGNOSIS — J387 Other diseases of larynx: Secondary | ICD-10-CM | POA: Diagnosis not present

## 2014-07-02 DIAGNOSIS — R000 Tachycardia, unspecified: Secondary | ICD-10-CM | POA: Diagnosis not present

## 2014-07-02 DIAGNOSIS — R490 Dysphonia: Secondary | ICD-10-CM | POA: Diagnosis not present

## 2014-07-02 DIAGNOSIS — H2703 Aphakia, bilateral: Secondary | ICD-10-CM | POA: Diagnosis not present

## 2014-07-07 DIAGNOSIS — F329 Major depressive disorder, single episode, unspecified: Secondary | ICD-10-CM | POA: Diagnosis not present

## 2014-07-07 DIAGNOSIS — M4806 Spinal stenosis, lumbar region: Secondary | ICD-10-CM | POA: Diagnosis not present

## 2014-07-07 DIAGNOSIS — G43709 Chronic migraine without aura, not intractable, without status migrainosus: Secondary | ICD-10-CM | POA: Diagnosis not present

## 2014-07-07 DIAGNOSIS — Z791 Long term (current) use of non-steroidal anti-inflammatories (NSAID): Secondary | ICD-10-CM | POA: Diagnosis not present

## 2014-07-07 DIAGNOSIS — Z880 Allergy status to penicillin: Secondary | ICD-10-CM | POA: Diagnosis not present

## 2014-07-07 DIAGNOSIS — M5410 Radiculopathy, site unspecified: Secondary | ICD-10-CM | POA: Diagnosis not present

## 2014-07-07 DIAGNOSIS — M47817 Spondylosis without myelopathy or radiculopathy, lumbosacral region: Secondary | ICD-10-CM | POA: Diagnosis not present

## 2014-07-07 DIAGNOSIS — M4120 Other idiopathic scoliosis, site unspecified: Secondary | ICD-10-CM | POA: Diagnosis not present

## 2014-07-07 DIAGNOSIS — M4697 Unspecified inflammatory spondylopathy, lumbosacral region: Secondary | ICD-10-CM | POA: Diagnosis not present

## 2014-07-07 DIAGNOSIS — Z885 Allergy status to narcotic agent status: Secondary | ICD-10-CM | POA: Diagnosis not present

## 2014-07-07 DIAGNOSIS — M533 Sacrococcygeal disorders, not elsewhere classified: Secondary | ICD-10-CM | POA: Diagnosis not present

## 2014-07-07 DIAGNOSIS — M5136 Other intervertebral disc degeneration, lumbar region: Secondary | ICD-10-CM | POA: Diagnosis not present

## 2014-07-07 DIAGNOSIS — G894 Chronic pain syndrome: Secondary | ICD-10-CM | POA: Diagnosis not present

## 2014-07-07 DIAGNOSIS — Z6835 Body mass index (BMI) 35.0-35.9, adult: Secondary | ICD-10-CM | POA: Diagnosis not present

## 2014-07-07 DIAGNOSIS — F419 Anxiety disorder, unspecified: Secondary | ICD-10-CM | POA: Diagnosis not present

## 2014-07-07 DIAGNOSIS — Z888 Allergy status to other drugs, medicaments and biological substances status: Secondary | ICD-10-CM | POA: Diagnosis not present

## 2014-07-07 DIAGNOSIS — Z79899 Other long term (current) drug therapy: Secondary | ICD-10-CM | POA: Diagnosis not present

## 2014-07-07 DIAGNOSIS — Z91030 Bee allergy status: Secondary | ICD-10-CM | POA: Diagnosis not present

## 2014-07-09 DIAGNOSIS — R05 Cough: Secondary | ICD-10-CM | POA: Diagnosis not present

## 2014-07-09 DIAGNOSIS — J069 Acute upper respiratory infection, unspecified: Secondary | ICD-10-CM | POA: Diagnosis not present

## 2014-07-09 DIAGNOSIS — R5383 Other fatigue: Secondary | ICD-10-CM | POA: Diagnosis not present

## 2014-07-20 DIAGNOSIS — G43709 Chronic migraine without aura, not intractable, without status migrainosus: Secondary | ICD-10-CM | POA: Diagnosis not present

## 2014-07-23 DIAGNOSIS — Z471 Aftercare following joint replacement surgery: Secondary | ICD-10-CM | POA: Diagnosis not present

## 2014-07-23 DIAGNOSIS — M1711 Unilateral primary osteoarthritis, right knee: Secondary | ICD-10-CM | POA: Diagnosis not present

## 2014-07-23 DIAGNOSIS — Z96652 Presence of left artificial knee joint: Secondary | ICD-10-CM | POA: Diagnosis not present

## 2014-07-30 DIAGNOSIS — Z471 Aftercare following joint replacement surgery: Secondary | ICD-10-CM | POA: Diagnosis not present

## 2014-07-30 DIAGNOSIS — Z96652 Presence of left artificial knee joint: Secondary | ICD-10-CM | POA: Diagnosis not present

## 2014-07-30 DIAGNOSIS — R2689 Other abnormalities of gait and mobility: Secondary | ICD-10-CM | POA: Diagnosis not present

## 2014-08-06 DIAGNOSIS — H02403 Unspecified ptosis of bilateral eyelids: Secondary | ICD-10-CM | POA: Diagnosis not present

## 2014-08-17 DIAGNOSIS — M25562 Pain in left knee: Secondary | ICD-10-CM | POA: Diagnosis not present

## 2014-08-17 DIAGNOSIS — M179 Osteoarthritis of knee, unspecified: Secondary | ICD-10-CM | POA: Diagnosis not present

## 2014-08-17 DIAGNOSIS — M25561 Pain in right knee: Secondary | ICD-10-CM | POA: Diagnosis not present

## 2014-08-25 DIAGNOSIS — J069 Acute upper respiratory infection, unspecified: Secondary | ICD-10-CM | POA: Diagnosis not present

## 2014-08-25 DIAGNOSIS — E119 Type 2 diabetes mellitus without complications: Secondary | ICD-10-CM | POA: Diagnosis not present

## 2014-08-25 DIAGNOSIS — E538 Deficiency of other specified B group vitamins: Secondary | ICD-10-CM | POA: Diagnosis not present

## 2014-08-25 DIAGNOSIS — R05 Cough: Secondary | ICD-10-CM | POA: Diagnosis not present

## 2014-08-26 DIAGNOSIS — H04123 Dry eye syndrome of bilateral lacrimal glands: Secondary | ICD-10-CM | POA: Insufficient documentation

## 2014-08-26 DIAGNOSIS — H0489 Other disorders of lacrimal system: Secondary | ICD-10-CM | POA: Diagnosis not present

## 2014-08-26 DIAGNOSIS — H15102 Unspecified episcleritis, left eye: Secondary | ICD-10-CM | POA: Diagnosis not present

## 2014-08-26 DIAGNOSIS — H15101 Unspecified episcleritis, right eye: Secondary | ICD-10-CM | POA: Insufficient documentation

## 2014-09-08 DIAGNOSIS — M1711 Unilateral primary osteoarthritis, right knee: Secondary | ICD-10-CM | POA: Diagnosis not present

## 2014-09-24 DIAGNOSIS — J45998 Other asthma: Secondary | ICD-10-CM | POA: Diagnosis not present

## 2014-09-24 DIAGNOSIS — E559 Vitamin D deficiency, unspecified: Secondary | ICD-10-CM | POA: Diagnosis not present

## 2014-09-24 DIAGNOSIS — Z23 Encounter for immunization: Secondary | ICD-10-CM | POA: Diagnosis not present

## 2014-09-24 DIAGNOSIS — J45909 Unspecified asthma, uncomplicated: Secondary | ICD-10-CM | POA: Diagnosis not present

## 2014-10-13 DIAGNOSIS — M25569 Pain in unspecified knee: Secondary | ICD-10-CM | POA: Diagnosis not present

## 2014-10-13 DIAGNOSIS — G894 Chronic pain syndrome: Secondary | ICD-10-CM | POA: Diagnosis not present

## 2014-10-13 DIAGNOSIS — M4696 Unspecified inflammatory spondylopathy, lumbar region: Secondary | ICD-10-CM | POA: Diagnosis not present

## 2014-10-13 DIAGNOSIS — M4806 Spinal stenosis, lumbar region: Secondary | ICD-10-CM | POA: Diagnosis not present

## 2014-10-19 DIAGNOSIS — R490 Dysphonia: Secondary | ICD-10-CM | POA: Diagnosis not present

## 2014-10-19 DIAGNOSIS — J029 Acute pharyngitis, unspecified: Secondary | ICD-10-CM | POA: Diagnosis not present

## 2014-10-19 DIAGNOSIS — R1310 Dysphagia, unspecified: Secondary | ICD-10-CM | POA: Diagnosis not present

## 2014-10-19 DIAGNOSIS — G43709 Chronic migraine without aura, not intractable, without status migrainosus: Secondary | ICD-10-CM | POA: Diagnosis not present

## 2014-10-21 DIAGNOSIS — M5412 Radiculopathy, cervical region: Secondary | ICD-10-CM | POA: Diagnosis not present

## 2014-10-21 DIAGNOSIS — M9901 Segmental and somatic dysfunction of cervical region: Secondary | ICD-10-CM | POA: Diagnosis not present

## 2014-10-25 DIAGNOSIS — J45909 Unspecified asthma, uncomplicated: Secondary | ICD-10-CM | POA: Diagnosis not present

## 2014-10-25 DIAGNOSIS — M5412 Radiculopathy, cervical region: Secondary | ICD-10-CM | POA: Diagnosis not present

## 2014-10-25 DIAGNOSIS — M9901 Segmental and somatic dysfunction of cervical region: Secondary | ICD-10-CM | POA: Diagnosis not present

## 2014-10-26 DIAGNOSIS — J45909 Unspecified asthma, uncomplicated: Secondary | ICD-10-CM | POA: Diagnosis not present

## 2014-10-26 DIAGNOSIS — R5383 Other fatigue: Secondary | ICD-10-CM | POA: Diagnosis not present

## 2014-10-26 DIAGNOSIS — J449 Chronic obstructive pulmonary disease, unspecified: Secondary | ICD-10-CM | POA: Diagnosis not present

## 2014-10-26 DIAGNOSIS — J9801 Acute bronchospasm: Secondary | ICD-10-CM | POA: Diagnosis not present

## 2014-10-26 DIAGNOSIS — J42 Unspecified chronic bronchitis: Secondary | ICD-10-CM | POA: Diagnosis not present

## 2014-10-26 DIAGNOSIS — J9811 Atelectasis: Secondary | ICD-10-CM | POA: Diagnosis not present

## 2014-10-27 DIAGNOSIS — Z6838 Body mass index (BMI) 38.0-38.9, adult: Secondary | ICD-10-CM | POA: Diagnosis not present

## 2014-10-27 DIAGNOSIS — E119 Type 2 diabetes mellitus without complications: Secondary | ICD-10-CM | POA: Diagnosis not present

## 2014-10-27 DIAGNOSIS — R0600 Dyspnea, unspecified: Secondary | ICD-10-CM | POA: Diagnosis not present

## 2014-10-27 DIAGNOSIS — Z885 Allergy status to narcotic agent status: Secondary | ICD-10-CM | POA: Diagnosis not present

## 2014-10-27 DIAGNOSIS — R0602 Shortness of breath: Secondary | ICD-10-CM | POA: Diagnosis not present

## 2014-10-27 DIAGNOSIS — Z87891 Personal history of nicotine dependence: Secondary | ICD-10-CM | POA: Diagnosis not present

## 2014-10-27 DIAGNOSIS — R05 Cough: Secondary | ICD-10-CM | POA: Diagnosis not present

## 2014-10-27 DIAGNOSIS — G8929 Other chronic pain: Secondary | ICD-10-CM | POA: Diagnosis not present

## 2014-10-27 DIAGNOSIS — E669 Obesity, unspecified: Secondary | ICD-10-CM | POA: Diagnosis not present

## 2014-10-27 DIAGNOSIS — Z880 Allergy status to penicillin: Secondary | ICD-10-CM | POA: Diagnosis not present

## 2014-10-27 DIAGNOSIS — J45909 Unspecified asthma, uncomplicated: Secondary | ICD-10-CM | POA: Diagnosis not present

## 2014-10-27 DIAGNOSIS — R062 Wheezing: Secondary | ICD-10-CM | POA: Diagnosis not present

## 2014-10-27 DIAGNOSIS — R5383 Other fatigue: Secondary | ICD-10-CM | POA: Diagnosis not present

## 2014-10-27 DIAGNOSIS — Z888 Allergy status to other drugs, medicaments and biological substances status: Secondary | ICD-10-CM | POA: Diagnosis not present

## 2014-10-27 DIAGNOSIS — I10 Essential (primary) hypertension: Secondary | ICD-10-CM | POA: Diagnosis not present

## 2014-10-27 DIAGNOSIS — G4733 Obstructive sleep apnea (adult) (pediatric): Secondary | ICD-10-CM | POA: Diagnosis not present

## 2014-11-04 ENCOUNTER — Ambulatory Visit: Payer: Self-pay | Admitting: Orthopedic Surgery

## 2014-11-04 NOTE — Progress Notes (Signed)
Preoperative surgical orders have been place into the Epic hospital system for Hayley [name] on 11/04/2014, 12:57 PM  by Mickel Crow for surgery on 12-06-2014.  Preop Total Knee orders including Experal, IV Tylenol, and IV Decadron as long as there are no contraindications to the above medications. Please note that the PCN reaction is a rash so may use Ancef for surgery. Arlee Muslim, PA-C

## 2014-11-10 DIAGNOSIS — M1288 Other specific arthropathies, not elsewhere classified, other specified site: Secondary | ICD-10-CM | POA: Diagnosis not present

## 2014-11-10 DIAGNOSIS — G894 Chronic pain syndrome: Secondary | ICD-10-CM | POA: Diagnosis not present

## 2014-11-10 DIAGNOSIS — M47817 Spondylosis without myelopathy or radiculopathy, lumbosacral region: Secondary | ICD-10-CM | POA: Diagnosis not present

## 2014-11-10 DIAGNOSIS — Z884 Allergy status to anesthetic agent status: Secondary | ICD-10-CM | POA: Diagnosis not present

## 2014-11-10 DIAGNOSIS — F509 Eating disorder, unspecified: Secondary | ICD-10-CM | POA: Diagnosis not present

## 2014-11-10 DIAGNOSIS — Z91030 Bee allergy status: Secondary | ICD-10-CM | POA: Diagnosis not present

## 2014-11-10 DIAGNOSIS — M545 Low back pain: Secondary | ICD-10-CM | POA: Diagnosis not present

## 2014-11-10 DIAGNOSIS — F329 Major depressive disorder, single episode, unspecified: Secondary | ICD-10-CM | POA: Diagnosis not present

## 2014-11-10 DIAGNOSIS — Z90710 Acquired absence of both cervix and uterus: Secondary | ICD-10-CM | POA: Diagnosis not present

## 2014-11-10 DIAGNOSIS — Z9089 Acquired absence of other organs: Secondary | ICD-10-CM | POA: Diagnosis not present

## 2014-11-10 DIAGNOSIS — Z9841 Cataract extraction status, right eye: Secondary | ICD-10-CM | POA: Diagnosis not present

## 2014-11-10 DIAGNOSIS — Z6839 Body mass index (BMI) 39.0-39.9, adult: Secondary | ICD-10-CM | POA: Diagnosis not present

## 2014-11-10 DIAGNOSIS — M47897 Other spondylosis, lumbosacral region: Secondary | ICD-10-CM | POA: Diagnosis not present

## 2014-11-10 DIAGNOSIS — M419 Scoliosis, unspecified: Secondary | ICD-10-CM | POA: Diagnosis not present

## 2014-11-10 DIAGNOSIS — Z885 Allergy status to narcotic agent status: Secondary | ICD-10-CM | POA: Diagnosis not present

## 2014-11-10 DIAGNOSIS — Z9842 Cataract extraction status, left eye: Secondary | ICD-10-CM | POA: Diagnosis not present

## 2014-11-10 DIAGNOSIS — Z880 Allergy status to penicillin: Secondary | ICD-10-CM | POA: Diagnosis not present

## 2014-11-10 DIAGNOSIS — Z9049 Acquired absence of other specified parts of digestive tract: Secondary | ICD-10-CM | POA: Diagnosis not present

## 2014-11-10 DIAGNOSIS — G43909 Migraine, unspecified, not intractable, without status migrainosus: Secondary | ICD-10-CM | POA: Diagnosis not present

## 2014-11-10 DIAGNOSIS — Z96652 Presence of left artificial knee joint: Secondary | ICD-10-CM | POA: Diagnosis not present

## 2014-11-10 DIAGNOSIS — M5136 Other intervertebral disc degeneration, lumbar region: Secondary | ICD-10-CM | POA: Diagnosis not present

## 2014-11-10 DIAGNOSIS — F419 Anxiety disorder, unspecified: Secondary | ICD-10-CM | POA: Diagnosis not present

## 2014-11-15 DIAGNOSIS — R110 Nausea: Secondary | ICD-10-CM

## 2014-11-15 HISTORY — DX: Nausea: R11.0

## 2014-11-17 DIAGNOSIS — F329 Major depressive disorder, single episode, unspecified: Secondary | ICD-10-CM | POA: Diagnosis not present

## 2014-11-17 DIAGNOSIS — E780 Pure hypercholesterolemia: Secondary | ICD-10-CM | POA: Diagnosis not present

## 2014-11-17 DIAGNOSIS — E039 Hypothyroidism, unspecified: Secondary | ICD-10-CM | POA: Diagnosis not present

## 2014-11-17 DIAGNOSIS — Z79899 Other long term (current) drug therapy: Secondary | ICD-10-CM | POA: Diagnosis not present

## 2014-11-17 DIAGNOSIS — E119 Type 2 diabetes mellitus without complications: Secondary | ICD-10-CM | POA: Diagnosis not present

## 2014-11-23 DIAGNOSIS — R1011 Right upper quadrant pain: Secondary | ICD-10-CM | POA: Diagnosis not present

## 2014-11-23 DIAGNOSIS — K580 Irritable bowel syndrome with diarrhea: Secondary | ICD-10-CM | POA: Diagnosis not present

## 2014-11-23 DIAGNOSIS — K5730 Diverticulosis of large intestine without perforation or abscess without bleeding: Secondary | ICD-10-CM | POA: Diagnosis not present

## 2014-11-29 DIAGNOSIS — Z01810 Encounter for preprocedural cardiovascular examination: Secondary | ICD-10-CM | POA: Diagnosis not present

## 2014-11-29 DIAGNOSIS — R0602 Shortness of breath: Secondary | ICD-10-CM | POA: Diagnosis not present

## 2014-11-30 ENCOUNTER — Encounter (HOSPITAL_COMMUNITY): Payer: Self-pay

## 2014-11-30 ENCOUNTER — Encounter (HOSPITAL_COMMUNITY)
Admission: RE | Admit: 2014-11-30 | Discharge: 2014-11-30 | Disposition: A | Discharge status: Home / Self Care | Payer: Medicare Other | Source: Ambulatory Visit | Attending: Orthopedic Surgery | Admitting: Orthopedic Surgery

## 2014-11-30 DIAGNOSIS — Z01812 Encounter for preprocedural laboratory examination: Secondary | ICD-10-CM | POA: Insufficient documentation

## 2014-11-30 DIAGNOSIS — E669 Obesity, unspecified: Secondary | ICD-10-CM | POA: Diagnosis not present

## 2014-11-30 DIAGNOSIS — M1711 Unilateral primary osteoarthritis, right knee: Secondary | ICD-10-CM | POA: Diagnosis not present

## 2014-11-30 DIAGNOSIS — G43909 Migraine, unspecified, not intractable, without status migrainosus: Secondary | ICD-10-CM | POA: Diagnosis not present

## 2014-11-30 DIAGNOSIS — Z01810 Encounter for preprocedural cardiovascular examination: Secondary | ICD-10-CM | POA: Diagnosis not present

## 2014-11-30 HISTORY — DX: Calculus of kidney: N20.0

## 2014-11-30 HISTORY — DX: Anxiety disorder, unspecified: F41.9

## 2014-11-30 HISTORY — DX: Major depressive disorder, single episode, unspecified: F32.9

## 2014-11-30 HISTORY — DX: Bronchitis, not specified as acute or chronic: J40

## 2014-11-30 HISTORY — DX: Sleep apnea, unspecified: G47.30

## 2014-11-30 HISTORY — DX: Depression, unspecified: F32.A

## 2014-11-30 LAB — URINE MICROSCOPIC-ADD ON

## 2014-11-30 LAB — SURGICAL PCR SCREEN
MRSA, PCR: NEGATIVE
Staphylococcus aureus: NEGATIVE

## 2014-11-30 LAB — URINALYSIS, ROUTINE W REFLEX MICROSCOPIC
Bilirubin Urine: NEGATIVE
Glucose, UA: NEGATIVE mg/dL
Hgb urine dipstick: NEGATIVE
Ketones, ur: NEGATIVE mg/dL
Nitrite: NEGATIVE
Protein, ur: NEGATIVE mg/dL
Specific Gravity, Urine: 1.019 (ref 1.005–1.030)
Urobilinogen, UA: 1 mg/dL (ref 0.0–1.0)
pH: 7.5 (ref 5.0–8.0)

## 2014-11-30 LAB — PROTIME-INR
INR: 0.98 (ref 0.00–1.49)
Prothrombin Time: 13.1 seconds (ref 11.6–15.2)

## 2014-11-30 LAB — APTT: aPTT: 33 seconds (ref 24–37)

## 2014-11-30 NOTE — Progress Notes (Signed)
Patient has a rash allergy to Penicillins.  Ancef ordered preop .

## 2014-11-30 NOTE — Progress Notes (Signed)
U/A with micro results  Done 11/30/14   faxed via EPIC to Dr Wynelle Link.

## 2014-11-30 NOTE — Patient Instructions (Signed)
JALEEA ALESI  11/30/2014   Your procedure is scheduled on: 12/06/2014     Report to [facility] Main  Entrance and follow signs to               [place] at     [phone].  Call this number if you have problems the morning of surgery [phone]   Remember: Eat a good healthy snack prior to bedtime.    Do not eat food or drink liquids :After Midnight.     Take these medicines the morning of surgery with A SIP OF WATER:  Albuterol Inhaler if needed and bring, nebulizer if needed, Flonase if needed, Zoloft, Synthriod, Dulera , Protonix                                You may not have any metal on your body including hair pins and              piercings  Do not wear jewelry, make-up, lotions, powders or perfumes., deodorant.              Do not wear nail polish.  Do not shave  48 hours prior to surgery.  .   Do not bring valuables to the hospital. [place].  Contacts, dentures or bridgework may not be worn into surgery.  Leave suitcase in the car. After surgery it may be brought to your room.       Special Instructions: coughing and deep breathing exercises , leg exercises               Please read over the following fact sheets you were given: _____________________________________________________________________             [facility] - Preparing for Surgery Before surgery, you can play an important role.  Because skin is not sterile, your skin needs to be as free of germs as possible.  You can reduce the number of germs on your skin by washing with CHG (chlorahexidine gluconate) soap before surgery.  CHG is an antiseptic cleaner which kills germs and bonds with the skin to continue killing germs even after washing. Please DO NOT use if you have an allergy to CHG or antibacterial soaps.  If your skin becomes reddened/irritated stop using the CHG and inform your nurse when you arrive at Short Stay. Do not  shave (including legs and underarms) for at least 48 hours prior to the first CHG shower.  You may shave your face/neck. Please follow these instructions carefully:  1.  Shower with CHG Soap the night before surgery and the  morning of Surgery.  2.  If you choose to wash your hair, wash your hair first as usual with your  normal  shampoo.  3.  After you shampoo, rinse your hair and body thoroughly to remove the  shampoo.                           4.  Use CHG as you would any other liquid soap.  You can apply chg directly  to the skin and wash  Gently with a scrungie or clean washcloth.  5.  Apply the CHG Soap to your body ONLY FROM THE NECK DOWN.   Do not use on face/ open                           Wound or open sores. Avoid contact with eyes, ears mouth and genitals (private parts).                       Wash face,  Genitals (private parts) with your normal soap.             6.  Wash thoroughly, paying special attention to the area where your surgery  will be performed.  7.  Thoroughly rinse your body with warm water from the neck down.  8.  DO NOT shower/wash with your normal soap after using and rinsing off  the CHG Soap.                9.  Pat yourself dry with a clean towel.            10.  Wear clean pajamas.            11.  Place clean sheets on your bed the night of your first shower and do not  sleep with pets. Day of Surgery : Do not apply any lotions/deodorants the morning of surgery.  Please wear clean clothes to the hospital/surgery center.  FAILURE TO FOLLOW THESE INSTRUCTIONS MAY RESULT IN THE CANCELLATION OF YOUR SURGERY PATIENT SIGNATURE_________________________________  NURSE SIGNATURE__________________________________  ________________________________________________________________________  WHAT IS A BLOOD TRANSFUSION? Blood Transfusion Information  A transfusion is the replacement of blood or some of its parts. Blood is made up of multiple cells  which provide different functions.  Red blood cells carry oxygen and are used for blood loss replacement.  White blood cells fight against infection.  Platelets control bleeding.  Plasma helps clot blood.  Other blood products are available for specialized needs, such as hemophilia or other clotting disorders. BEFORE THE TRANSFUSION  Who gives blood for transfusions?   Healthy volunteers who are fully evaluated to make sure their blood is safe. This is blood bank blood. Transfusion therapy is the safest it has ever been in the practice of medicine. Before blood is taken from a donor, a complete history is taken to make sure that person has no history of diseases nor engages in risky social behavior (examples are intravenous drug use or sexual activity with multiple partners). The donor's travel history is screened to minimize risk of transmitting infections, such as malaria. The donated blood is tested for signs of infectious diseases, such as HIV and hepatitis. The blood is then tested to be sure it is compatible with you in order to minimize the chance of a transfusion reaction. If you or a relative donates blood, this is often done in anticipation of surgery and is not appropriate for emergency situations. It takes many days to process the donated blood. RISKS AND COMPLICATIONS Although transfusion therapy is very safe and saves many lives, the main dangers of transfusion include:  1. Getting an infectious disease. 2. Developing a transfusion reaction. This is an allergic reaction to something in the blood you were given. Every precaution is taken to prevent this. The decision to have a blood transfusion has been considered carefully by your caregiver before blood is given. Blood is not given unless the benefits outweigh  the risks. AFTER THE TRANSFUSION  Right after receiving a blood transfusion, you will usually feel much better and more energetic. This is especially true if your red blood  cells have gotten low (anemic). The transfusion raises the level of the red blood cells which carry oxygen, and this usually causes an energy increase.  The nurse administering the transfusion will monitor you carefully for complications. HOME CARE INSTRUCTIONS  No special instructions are needed after a transfusion. You may find your energy is better. Speak with your caregiver about any limitations on activity for underlying diseases you may have. SEEK MEDICAL CARE IF:   Your condition is not improving after your transfusion.  You develop redness or irritation at the intravenous (IV) site. SEEK IMMEDIATE MEDICAL CARE IF:  Any of the following symptoms occur over the next 12 hours:  Shaking chills.  You have a temperature by mouth above 102 F (38.9 C), not controlled by medicine.  Chest, back, or muscle pain.  People around you feel you are not acting correctly or are confused.  Shortness of breath or difficulty breathing.  Dizziness and fainting.  You get a rash or develop hives.  You have a decrease in urine output.  Your urine turns a dark color or changes to pink, red, or brown. Any of the following symptoms occur over the next 10 days:  You have a temperature by mouth above 102 F (38.9 C), not controlled by medicine.  Shortness of breath.  Weakness after normal activity.  The white part of the eye turns yellow (jaundice).  You have a decrease in the amount of urine or are urinating less often.  Your urine turns a dark color or changes to pink, red, or brown. Document Released: 08/31/2000 Document Revised: 11/26/2011 Document Reviewed: 04/19/2008 ExitCare Patient Information 2014 [place].  _______________________________________________________________________  Incentive Spirometer  An incentive spirometer is a tool that can help keep your lungs clear and active. This tool measures how well you are filling your lungs with each breath. Taking long deep  breaths may help reverse or decrease the chance of developing breathing (pulmonary) problems (especially infection) following:  A long period of time when you are unable to move or be active. BEFORE THE PROCEDURE   If the spirometer includes an indicator to show your best effort, your nurse or respiratory therapist will set it to a desired goal.  If possible, sit up straight or lean slightly forward. Try not to slouch.  Hold the incentive spirometer in an upright position. INSTRUCTIONS FOR USE  3. Sit on the edge of your bed if possible, or sit up as far as you can in bed or on a chair. 4. Hold the incentive spirometer in an upright position. 5. Breathe out normally. 6. Place the mouthpiece in your mouth and seal your lips tightly around it. 7. Breathe in slowly and as deeply as possible, raising the piston or the ball toward the top of the column. 8. Hold your breath for 3-5 seconds or for as long as possible. Allow the piston or ball to fall to the bottom of the column. 9. Remove the mouthpiece from your mouth and breathe out normally. 10. Rest for a few seconds and repeat Steps 1 through 7 at least 10 times every 1-2 hours when you are awake. Take your time and take a few normal breaths between deep breaths. 11. The spirometer may include an indicator to show your best effort. Use the indicator as a goal to work  toward during each repetition. 12. After each set of 10 deep breaths, practice coughing to be sure your lungs are clear. If you have an incision (the cut made at the time of surgery), support your incision when coughing by placing a pillow or rolled up towels firmly against it. Once you are able to get out of bed, walk around indoors and cough well. You may stop using the incentive spirometer when instructed by your caregiver.  RISKS AND COMPLICATIONS  Take your time so you do not get dizzy or light-headed.  If you are in pain, you may need to take or ask for pain medication  before doing incentive spirometry. It is harder to take a deep breath if you are having pain. AFTER USE  Rest and breathe slowly and easily.  It can be helpful to keep track of a log of your progress. Your caregiver can provide you with a simple table to help with this. If you are using the spirometer at home, follow these instructions: [place] IF:   You are having difficultly using the spirometer.  You have trouble using the spirometer as often as instructed.  Your pain medication is not giving enough relief while using the spirometer.  You develop fever of 100.5 F (38.1 C) or higher. SEEK IMMEDIATE MEDICAL CARE IF:   You cough up bloody sputum that had not been present before.  You develop fever of 102 F (38.9 C) or greater.  You develop worsening pain at or near the incision site. MAKE SURE YOU:   Understand these instructions.  Will watch your condition.  Will get help right away if you are not doing well or get worse. Document Released: 01/14/2007 Document Revised: 11/26/2011 Document Reviewed: 03/17/2007 [facility] Patient Information 2014 [place], [place].   ________________________________________________________________________

## 2014-11-30 NOTE — Progress Notes (Signed)
Stress Test done 11/29/14 results on chart.

## 2014-11-30 NOTE — Progress Notes (Signed)
EKG- 05/16/14 EPIC  LOV with DR Minna Antis- 11/17/14 in EPIC along with labs to include CBC adn CMP done 11/17/14 on chart  12/26/13 - ECHO- EPIC  05/15/2014 - 2V CXR in EPIC  Requested last CXR done at Dr Wilmon Pali office  To be faxed.   Requested stress test results done 11/29/14 at [facility] to be faxed.

## 2014-12-01 DIAGNOSIS — G250 Essential tremor: Secondary | ICD-10-CM | POA: Diagnosis not present

## 2014-12-01 DIAGNOSIS — F329 Major depressive disorder, single episode, unspecified: Secondary | ICD-10-CM | POA: Diagnosis not present

## 2014-12-01 DIAGNOSIS — E1165 Type 2 diabetes mellitus with hyperglycemia: Secondary | ICD-10-CM | POA: Diagnosis not present

## 2014-12-01 NOTE — H&P (Signed)
TOTAL KNEE ADMISSION H&P  Patient is being admitted for right total knee arthroplasty.  Subjective:  Chief Complaint:right knee pain.  HPI: Hayley [name], 69 y.o. female, has a history of pain and functional disability in the right knee due to arthritis and has failed non-surgical conservative treatments for greater than 12 weeks to includeNSAID's and/or analgesics, corticosteriod injections, viscosupplementation injections and activity modification.  Onset of symptoms was gradual, starting 8 years ago with gradually worsening course since that time. The patient noted prior procedures on the knee to include  arthroscopy and menisectomy on the right knee(s).  Patient currently rates pain in the right knee(s) at 9 out of 10 with activity. Patient has night pain, worsening of pain with activity and weight bearing, pain that interferes with activities of daily living, pain with passive range of motion, crepitus and joint swelling.  Patient has evidence of periarticular osteophytes and joint space narrowing by imaging studies. There is no active infection.  Patient Active Problem List   Diagnosis Date Noted  . Hypothyroidism 03/03/2014  . Asthma 03/03/2014  . Anxiety 03/03/2014  . Acute blood loss anemia 03/03/2014  . Left knee pain 03/03/2014  . Failed total knee, left 02/24/2014  . Failed total knee arthroplasty 02/24/2014  . OSA on CPAP 12/23/2013  . Acute-on-chronic respiratory failure 12/21/2013  . History of pulmonary embolus (PE) 12/21/2013  . Pulmonary embolism 12/06/2011  . Thrombophlebitis 12/06/2011  . Appendicitis, acute, with peritonitis 11/27/2011  . 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 DIAPHORESIS 04/28/2010  . DYSPNEA 04/28/2010  . Hypoxemia 04/28/2010  . DIABETES MELLITUS, TYPE II 04/27/2010  . Obesity, Class III, BMI 40-49.9 (morbid obesity) 04/27/2010  . ANEMIA 04/27/2010  . ANXIETY DEPRESSION 04/27/2010  . DEPRESSION 04/27/2010  . MIGRAINE HEADACHE 04/27/2010  .  NEUROPATHY 04/27/2010  . HEMORRHOIDS 04/27/2010  . GERD 04/27/2010  . DIVERTICULOSIS, COLON 04/27/2010  . IBS 04/27/2010  . CHOLELITHIASIS 04/27/2010  . RENAL CALCULUS 04/27/2010   Past Medical History  Diagnosis Date  . Hyperthyroidism   . Toxic goiter   . Reflux   . Colon polyps   . Migraines   . 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 Obesity, Class III, BMI 40-49.9 (morbid obesity) 04/27/2010    02-17-14 reports some weight loss- intentional  . Pelvic pain     02-17-14 "states thinks its back related on left groin"  . Anemia   . Clotting disorder 02-17-14    S/p appendectomy-developed Pulmonary emboli-tx. warfarin-d/c 1 yr ago.  . Osteoarthritis     in back(chronic pain)  . Pulmonary emboli     s/p Appendectomy '13 [facility]  . Diabetes mellitus without complication     "borderline"- oral med  . Right knee pain   . Sleep apnea     cpap - settings at 4   . Bronchitis     hx of   . Anxiety   . Depression   . Kidney stones   . Neuromuscular disorder     hands and throat"spasmodic dysphonia", tremors - thimb and forefinger     Past Surgical History  Procedure Laterality Date  . Abdominal hysterectomy    . Zenkers diverticulum    . Kidney stone surgery    . Laparoscopic appendectomy  11/27/2011    Procedure: APPENDECTOMY LAPAROSCOPIC;  Surgeon: Adin Hector, MD;  Location: [facility] ORS;  Service: General;  Laterality: N/A;  . Cholecystectomy    . Knee arthroscopy  1998    right  . Hand surgery  2004    both hands  . Botox injection      for migranes  . Cataract extraction, bilateral Bilateral 03/2013   . Vocal cord surgery  02-17-14    01-12-14 done in [place] [facility](s/p selective denervation/reinnervation recurrent laryngeal nerve surgery)  . Appendectomy      3'13 [facility] s/p developed  Pulmonary Emboli  . Total knee revision Left 02/24/2014    Procedure: LEFT TOTAL KNEE REVISION;  Surgeon: Gearlean Alf, MD;  Location: [facility]  ORS;  Service: Orthopedics;  Laterality: Left;     Current outpatient prescriptions:  .  albuterol (PROVENTIL HFA;VENTOLIN HFA) 108 (90 BASE) MCG/ACT inhaler, Inhale 1 puff into the lungs every 6 (six) hours as needed for wheezing or shortness of breath., Disp: , Rfl:  .  albuterol (PROVENTIL) (2.5 MG/3ML) 0.083% nebulizer solution, Take 2.5 mg by nebulization every 6 (six) hours as needed for wheezing or shortness of breath., Disp: , Rfl:  .  benzonatate (TESSALON) 100 MG capsule, Take 100 mg by mouth 3 (three) times daily as needed for cough., Disp: , Rfl:  .  cholecalciferol (VITAMIN D) 1000 UNITS tablet, Take 1,000 Units by mouth daily., Disp: , Rfl:  .  clonazePAM (KLONOPIN) 0.5 MG tablet, Take 0.5 mg by mouth at bedtime., Disp: , Rfl:  .  cyclobenzaprine (FLEXERIL) 10 MG tablet, Take 10 mg by mouth at bedtime. , Disp: , Rfl:  .  diclofenac (FLECTOR) 1.3 % PTCH, Place 1 patch onto the skin daily as needed (for pain). Apply to back, Disp: , Rfl:  .  diclofenac sodium (VOLTAREN) 1 % GEL, Apply 4 g topically daily as needed (Knee pain)., Disp: , Rfl:  .  EPINEPHrine (EPIPEN) 0.3 mg/0.3 mL DEVI, Inject 0.3 mg into the muscle once as needed (Allergic reaction). , Disp: , Rfl:  .  fluorometholone (FML) 0.1 % ophthalmic suspension, Place 1 drop into both eyes daily as needed (Eye allergies). , Disp: , Rfl: 0 .  fluticasone (FLONASE) 50 MCG/ACT nasal spray, Place 1 spray into both nostrils daily as needed for allergies or rhinitis., Disp: , Rfl:  .  gabapentin (NEURONTIN) 800 MG tablet, Take 800-1,600 mg by mouth 2 (two) times daily. 800 in the morning and 1600 at pm, Disp: , Rfl:  .  ketorolac (TORADOL) 10 MG tablet, Take 10 mg by mouth every 6 (six) hours as needed (for headache)., Disp: , Rfl:  .  levothyroxine (SYNTHROID, LEVOTHROID) 25 MCG tablet, Take 25 mcg by mouth every morning. , Disp: , Rfl:  .  mometasone-formoterol (DULERA) 100-5 MCG/ACT AERO, Inhale 2 puffs into the lungs 2 (two) times  daily., Disp: , Rfl:  .  nystatin (MYCOSTATIN) 100000 UNIT/ML suspension, Take 5 mLs by mouth daily as needed (Yeast)., Disp: , Rfl:  .  ondansetron (ZOFRAN) 4 MG tablet, Take 4 mg by mouth every 8 (eight) hours as needed for nausea or vomiting., Disp: , Rfl:  .  ondansetron (ZOFRAN-ODT) 8 MG disintegrating tablet, Take 8 mg by mouth every 8 (eight) hours as needed for nausea or vomiting., Disp: , Rfl:  .  oxyCODONE-acetaminophen (PERCOCET) 10-325 MG per tablet, Take 0.5 tablets by mouth 2 (two) times daily as needed for pain., Disp: , Rfl:  .  pantoprazole (PROTONIX) 40 MG tablet, Take 40 mg by mouth daily., Disp: , Rfl:  .  Phenazopyridine HCl (AZO TABS PO), Take 1 tablet by mouth daily as needed (UTI)., Disp: , Rfl:  .  primidone (MYSOLINE) 50 MG tablet, Take 50 mg by mouth at bedtime., Disp: , Rfl:  .  Probiotic Product (PROBIOTIC DAILY PO), Take 1 capsule by mouth daily., Disp: , Rfl:  .  promethazine (PHENERGAN) 25 MG tablet, Take 25 mg by mouth every 6 (six) hours as needed for nausea or vomiting., Disp: , Rfl:  .  rizatriptan (MAXALT) 10 MG tablet, Take 10 mg by mouth as needed for migraine (migraine). May repeat in 2 hours if needed, Disp: , Rfl:  .  sertraline (ZOLOFT) 100 MG tablet, Take 100 mg by mouth daily., Disp: , Rfl:  .  sitaGLIPtin (JANUVIA) 100 MG tablet, Take 100 mg by mouth every evening. , Disp: , Rfl:  .  SUMAtriptan (IMITREX) 6 MG/0.5ML SOLN injection, Inject 6 mg into the skin every 2 (two) hours as needed for migraine or headache. May repeat in 2 hours if headache persists or recurs., Disp: , Rfl:  .  valACYclovir (VALTREX) 1000 MG tablet, Take 2,000 mg by mouth 2 (two) times daily as needed (Fever blisters). , Disp: , Rfl: 2  Allergies  Allergen Reactions  . Nutritional Supplements Anaphylaxis    Pt does not remember this happening  . Bee Venom     unknown  . Etodolac Nausea And Vomiting  . Morphine Other (See Comments)    Hallucinations  . Propoxyphene  N-Acetaminophen Nausea And Vomiting  . Penicillins Rash    History  Substance Use Topics  . Smoking status: Former Smoker    Quit date: 01/03/1982  . Smokeless tobacco: Never Used  . Alcohol Use: No    Family History  Problem Relation Age of Onset  . Heart disease Mother      Review of Systems  Constitutional: Positive for malaise/fatigue. Negative for fever, chills, weight loss and diaphoresis.  HENT: Negative.   Eyes: Negative.   Respiratory: Positive for cough, shortness of breath and wheezing. Negative for hemoptysis and sputum production.        SOB with exertion  Cardiovascular: Negative.   Gastrointestinal: Negative.   Genitourinary: Negative.   Musculoskeletal: Positive for back pain and joint pain. Negative for myalgias, falls and neck pain.       Right knee pain  Skin: Negative.   Neurological: Positive for tremors. Negative for dizziness, tingling, sensory change, speech change, focal weakness, seizures, loss of consciousness and weakness.  Endo/Heme/Allergies: Negative.   Psychiatric/Behavioral: Positive for depression. Negative for suicidal ideas, hallucinations, memory loss and substance abuse. The patient is nervous/anxious. The patient does not have insomnia.     Objective:  Physical Exam  Constitutional: She is oriented to person, place, and time. She appears well-developed. No distress.  Obese  HENT:  Head: Normocephalic and atraumatic.  Right Ear: External ear normal.  Left Ear: External ear normal.  Nose: Nose normal.  Mouth/Throat: Oropharynx is clear and moist.  Eyes: Conjunctivae and EOM are normal.  Neck: Normal range of motion. Neck supple.  Cardiovascular: Normal rate, regular rhythm, normal heart sounds and intact distal pulses.   Respiratory: Effort normal. No respiratory distress. She has wheezes.  GI: Soft. Bowel sounds are normal. She exhibits no distension. There is no tenderness.  Musculoskeletal:       Right hip: Normal.       Left  hip: Normal.       Right knee: She exhibits decreased range of motion, swelling and effusion. She exhibits no erythema. Tenderness found. Medial joint line and lateral joint line tenderness noted.  Left knee: Normal.       Right lower leg: She exhibits edema.       Left lower leg: She exhibits edema.  Right knee lacks about 10 degrees of extension, flexion back to 115 with pain, mild effusion.  Neurological: She is alert and oriented to person, place, and time. She has normal strength and normal reflexes. No sensory deficit.  Skin: No rash noted. She is not diaphoretic. No erythema.  Psychiatric: She has a normal mood and affect. Her behavior is normal.    Vitals  Weight: 241 lb Height: 66in Body Surface Area: 2.17 m Body Mass Index: 38.9 kg/m  Pulse: 76 (Regular)  BP: 128/72 (Sitting, Left Arm, Standard)  Imaging Review Plain radiographs demonstrate severe degenerative joint disease of the right knee(s). The overall alignment ismild valgus. The bone quality appears to be good for age and reported activity level.  Assessment/Plan:  End stage primary osteoarthritis, right knee   The patient history, physical examination, clinical judgment of the provider and imaging studies are consistent with end stage degenerative joint disease of the right knee(s) and total knee arthroplasty is deemed medically necessary. The treatment options including medical management, injection therapy arthroscopy and arthroplasty were discussed at length. The risks and benefits of total knee arthroplasty were presented and reviewed. The risks due to aseptic loosening, infection, stiffness, patella tracking problems, thromboembolic complications and other imponderables were discussed. The patient acknowledged the explanation, agreed to proceed with the plan and consent was signed. Patient is being admitted for inpatient treatment for surgery, pain control, PT, OT, prophylactic antibiotics, VTE  prophylaxis, progressive ambulation and ADL's and discharge planning. The patient is planning to be discharged home with home health services vs home depending on progress while inpatient.    PCP: Dr. Minna Antis TXA topical   Ardeen Jourdain, PA-C

## 2014-12-01 NOTE — Progress Notes (Signed)
EKG from DR [facility] office dated 11/30/2014 on chart  ECHO from 2011 on chart.

## 2014-12-01 NOTE — Progress Notes (Signed)
CXR from 10/26/2014 shown to Dr Landry Dyke ( anesthesia ) .  Per Dr Landry Dyke no need to repeat CXR from 10/26/14 that is on chart.

## 2014-12-01 NOTE — Progress Notes (Signed)
Requested ECHO and EKG done on 11/29/14 and 11/30/14.

## 2014-12-02 DIAGNOSIS — H04161 Lacrimal gland dislocation, right lacrimal gland: Secondary | ICD-10-CM | POA: Diagnosis not present

## 2014-12-06 ENCOUNTER — Inpatient Hospital Stay (HOSPITAL_COMMUNITY): Payer: Medicare Other | Admitting: Certified Registered"

## 2014-12-06 ENCOUNTER — Encounter (HOSPITAL_COMMUNITY): Payer: Self-pay

## 2014-12-06 ENCOUNTER — Encounter (HOSPITAL_COMMUNITY)
Admission: RE | Disposition: A | Discharge status: Skilled Nursing Facility | Payer: Self-pay | Source: Ambulatory Visit | Attending: Orthopedic Surgery

## 2014-12-06 ENCOUNTER — Inpatient Hospital Stay (HOSPITAL_COMMUNITY)
Admission: RE | Admit: 2014-12-06 | Discharge: 2014-12-13 | DRG: 469 | Disposition: A | Discharge status: Skilled Nursing Facility | Payer: Medicare Other | Source: Ambulatory Visit | Attending: Orthopedic Surgery | Admitting: Orthopedic Surgery

## 2014-12-06 DIAGNOSIS — F418 Other specified anxiety disorders: Secondary | ICD-10-CM | POA: Diagnosis present

## 2014-12-06 DIAGNOSIS — R197 Diarrhea, unspecified: Secondary | ICD-10-CM | POA: Diagnosis present

## 2014-12-06 DIAGNOSIS — F341 Dysthymic disorder: Secondary | ICD-10-CM | POA: Diagnosis present

## 2014-12-06 DIAGNOSIS — G4733 Obstructive sleep apnea (adult) (pediatric): Secondary | ICD-10-CM | POA: Diagnosis present

## 2014-12-06 DIAGNOSIS — M25661 Stiffness of right knee, not elsewhere classified: Secondary | ICD-10-CM | POA: Diagnosis not present

## 2014-12-06 DIAGNOSIS — M179 Osteoarthritis of knee, unspecified: Secondary | ICD-10-CM | POA: Diagnosis present

## 2014-12-06 DIAGNOSIS — I2699 Other pulmonary embolism without acute cor pulmonale: Secondary | ICD-10-CM | POA: Diagnosis not present

## 2014-12-06 DIAGNOSIS — E1165 Type 2 diabetes mellitus with hyperglycemia: Secondary | ICD-10-CM | POA: Diagnosis present

## 2014-12-06 DIAGNOSIS — J811 Chronic pulmonary edema: Secondary | ICD-10-CM | POA: Diagnosis not present

## 2014-12-06 DIAGNOSIS — Z9049 Acquired absence of other specified parts of digestive tract: Secondary | ICD-10-CM | POA: Diagnosis present

## 2014-12-06 DIAGNOSIS — Z79899 Other long term (current) drug therapy: Secondary | ICD-10-CM | POA: Diagnosis not present

## 2014-12-06 DIAGNOSIS — K5730 Diverticulosis of large intestine without perforation or abscess without bleeding: Secondary | ICD-10-CM | POA: Diagnosis not present

## 2014-12-06 DIAGNOSIS — Z87891 Personal history of nicotine dependence: Secondary | ICD-10-CM | POA: Diagnosis not present

## 2014-12-06 DIAGNOSIS — Z9889 Other specified postprocedural states: Secondary | ICD-10-CM | POA: Diagnosis not present

## 2014-12-06 DIAGNOSIS — J9601 Acute respiratory failure with hypoxia: Secondary | ICD-10-CM | POA: Diagnosis not present

## 2014-12-06 DIAGNOSIS — E1310 Other specified diabetes mellitus with ketoacidosis without coma: Secondary | ICD-10-CM | POA: Diagnosis not present

## 2014-12-06 DIAGNOSIS — E119 Type 2 diabetes mellitus without complications: Secondary | ICD-10-CM | POA: Diagnosis present

## 2014-12-06 DIAGNOSIS — Z86711 Personal history of pulmonary embolism: Secondary | ICD-10-CM | POA: Diagnosis present

## 2014-12-06 DIAGNOSIS — R278 Other lack of coordination: Secondary | ICD-10-CM | POA: Diagnosis not present

## 2014-12-06 DIAGNOSIS — Z01812 Encounter for preprocedural laboratory examination: Secondary | ICD-10-CM | POA: Diagnosis not present

## 2014-12-06 DIAGNOSIS — Z471 Aftercare following joint replacement surgery: Secondary | ICD-10-CM | POA: Diagnosis not present

## 2014-12-06 DIAGNOSIS — M150 Primary generalized (osteo)arthritis: Secondary | ICD-10-CM | POA: Diagnosis not present

## 2014-12-06 DIAGNOSIS — Z6841 Body Mass Index (BMI) 40.0 and over, adult: Secondary | ICD-10-CM | POA: Diagnosis not present

## 2014-12-06 DIAGNOSIS — R918 Other nonspecific abnormal finding of lung field: Secondary | ICD-10-CM | POA: Diagnosis not present

## 2014-12-06 DIAGNOSIS — K580 Irritable bowel syndrome with diarrhea: Secondary | ICD-10-CM | POA: Diagnosis present

## 2014-12-06 DIAGNOSIS — E11649 Type 2 diabetes mellitus with hypoglycemia without coma: Secondary | ICD-10-CM | POA: Diagnosis present

## 2014-12-06 DIAGNOSIS — M1711 Unilateral primary osteoarthritis, right knee: Secondary | ICD-10-CM

## 2014-12-06 DIAGNOSIS — K219 Gastro-esophageal reflux disease without esophagitis: Secondary | ICD-10-CM | POA: Diagnosis present

## 2014-12-06 DIAGNOSIS — K760 Fatty (change of) liver, not elsewhere classified: Secondary | ICD-10-CM | POA: Diagnosis not present

## 2014-12-06 DIAGNOSIS — E876 Hypokalemia: Secondary | ICD-10-CM | POA: Diagnosis not present

## 2014-12-06 DIAGNOSIS — R112 Nausea with vomiting, unspecified: Secondary | ICD-10-CM

## 2014-12-06 DIAGNOSIS — M25561 Pain in right knee: Secondary | ICD-10-CM | POA: Diagnosis not present

## 2014-12-06 DIAGNOSIS — S83106A Unspecified dislocation of unspecified knee, initial encounter: Secondary | ICD-10-CM | POA: Diagnosis not present

## 2014-12-06 DIAGNOSIS — D649 Anemia, unspecified: Secondary | ICD-10-CM | POA: Diagnosis not present

## 2014-12-06 DIAGNOSIS — Z9989 Dependence on other enabling machines and devices: Secondary | ICD-10-CM

## 2014-12-06 DIAGNOSIS — R531 Weakness: Secondary | ICD-10-CM | POA: Diagnosis not present

## 2014-12-06 DIAGNOSIS — R490 Dysphonia: Secondary | ICD-10-CM | POA: Diagnosis not present

## 2014-12-06 DIAGNOSIS — E662 Morbid (severe) obesity with alveolar hypoventilation: Secondary | ICD-10-CM | POA: Diagnosis present

## 2014-12-06 DIAGNOSIS — M21261 Flexion deformity, right knee: Secondary | ICD-10-CM | POA: Diagnosis not present

## 2014-12-06 DIAGNOSIS — Z8249 Family history of ischemic heart disease and other diseases of the circulatory system: Secondary | ICD-10-CM | POA: Diagnosis not present

## 2014-12-06 DIAGNOSIS — J9621 Acute and chronic respiratory failure with hypoxia: Secondary | ICD-10-CM | POA: Diagnosis not present

## 2014-12-06 DIAGNOSIS — Z96651 Presence of right artificial knee joint: Secondary | ICD-10-CM | POA: Diagnosis not present

## 2014-12-06 DIAGNOSIS — R0902 Hypoxemia: Secondary | ICD-10-CM

## 2014-12-06 DIAGNOSIS — E118 Type 2 diabetes mellitus with unspecified complications: Secondary | ICD-10-CM | POA: Diagnosis not present

## 2014-12-06 DIAGNOSIS — Z8672 Personal history of thrombophlebitis: Secondary | ICD-10-CM | POA: Diagnosis not present

## 2014-12-06 DIAGNOSIS — R1314 Dysphagia, pharyngoesophageal phase: Secondary | ICD-10-CM | POA: Diagnosis not present

## 2014-12-06 DIAGNOSIS — E1110 Type 2 diabetes mellitus with ketoacidosis without coma: Secondary | ICD-10-CM

## 2014-12-06 DIAGNOSIS — E039 Hypothyroidism, unspecified: Secondary | ICD-10-CM | POA: Diagnosis present

## 2014-12-06 DIAGNOSIS — J9811 Atelectasis: Secondary | ICD-10-CM | POA: Diagnosis not present

## 2014-12-06 DIAGNOSIS — M1710 Unilateral primary osteoarthritis, unspecified knee: Secondary | ICD-10-CM | POA: Diagnosis present

## 2014-12-06 DIAGNOSIS — R2681 Unsteadiness on feet: Secondary | ICD-10-CM | POA: Diagnosis not present

## 2014-12-06 DIAGNOSIS — G4730 Sleep apnea, unspecified: Secondary | ICD-10-CM | POA: Diagnosis not present

## 2014-12-06 DIAGNOSIS — F411 Generalized anxiety disorder: Secondary | ICD-10-CM | POA: Diagnosis not present

## 2014-12-06 HISTORY — PX: TOTAL KNEE ARTHROPLASTY: SHX125

## 2014-12-06 HISTORY — DX: Nausea: R11.0

## 2014-12-06 LAB — GLUCOSE, CAPILLARY
Glucose-Capillary: 142 mg/dL — ABNORMAL HIGH (ref 70–99)
Glucose-Capillary: 123 mg/dL — ABNORMAL HIGH (ref 70–99)
Glucose-Capillary: 204 mg/dL — ABNORMAL HIGH (ref 70–99)
Glucose-Capillary: 144 mg/dL — ABNORMAL HIGH (ref 70–99)

## 2014-12-06 LAB — TYPE AND SCREEN
ABO/RH(D): O POS
Antibody Screen: NEGATIVE

## 2014-12-06 SURGERY — ARTHROPLASTY, KNEE, TOTAL
Anesthesia: Spinal | Site: Knee | Laterality: Right

## 2014-12-06 MED ORDER — HYDROMORPHONE HCL 2 MG PO TABS
2.0000 mg | ORAL_TABLET | ORAL | Status: DC | PRN
  Administered 2014-12-06: 2 mg via ORAL
  Administered 2014-12-06 – 2014-12-09 (×6): 4 mg via ORAL
  Administered 2014-12-10 (×2): 2 mg via ORAL
  Filled 2014-12-06: qty 1
  Filled 2014-12-06 (×3): qty 2
  Filled 2014-12-06: qty 1
  Filled 2014-12-06 (×3): qty 2
  Filled 2014-12-06: qty 1

## 2014-12-06 MED ORDER — DEXAMETHASONE SODIUM PHOSPHATE 10 MG/ML IJ SOLN
10.0000 mg | Freq: Once | INTRAMUSCULAR | Status: AC
  Administered 2014-12-07: 10 mg via INTRAVENOUS
  Filled 2014-12-06: qty 1

## 2014-12-06 MED ORDER — CHLORHEXIDINE GLUCONATE 4 % EX LIQD: 60.0000 mL | Freq: Once | CUTANEOUS | Status: DC

## 2014-12-06 MED ORDER — SERTRALINE HCL 100 MG PO TABS
100.0000 mg | ORAL_TABLET | Freq: Every day | ORAL | Status: DC
  Administered 2014-12-07 – 2014-12-13 (×7): 100 mg via ORAL
  Filled 2014-12-06 (×7): qty 1

## 2014-12-06 MED ORDER — GABAPENTIN 400 MG PO CAPS
800.0000 mg | ORAL_CAPSULE | Freq: Every day | ORAL | Status: DC
  Administered 2014-12-07 – 2014-12-13 (×7): 800 mg via ORAL
  Filled 2014-12-06 (×7): qty 2

## 2014-12-06 MED ORDER — LACTATED RINGERS IV SOLN: INTRAVENOUS | Status: DC

## 2014-12-06 MED ORDER — BUPIVACAINE LIPOSOME 1.3 % IJ SUSP
20.0000 mL | Freq: Once | INTRAMUSCULAR | Status: DC
  Filled 2014-12-06: qty 20

## 2014-12-06 MED ORDER — DEXAMETHASONE SODIUM PHOSPHATE 10 MG/ML IJ SOLN: 10.0000 mg | Freq: Once | INTRAMUSCULAR | Status: DC

## 2014-12-06 MED ORDER — BUPIVACAINE IN DEXTROSE 0.75-8.25 % IT SOLN
INTRATHECAL | Status: DC | PRN
  Administered 2014-12-06: 2 mL via INTRATHECAL

## 2014-12-06 MED ORDER — METOCLOPRAMIDE HCL 10 MG PO TABS
5.0000 mg | ORAL_TABLET | Freq: Three times a day (TID) | ORAL | Status: DC | PRN
  Administered 2014-12-08 – 2014-12-09 (×2): 10 mg via ORAL
  Filled 2014-12-06 (×2): qty 1

## 2014-12-06 MED ORDER — CLONAZEPAM 0.5 MG PO TABS
0.5000 mg | ORAL_TABLET | Freq: Every day | ORAL | Status: DC
  Administered 2014-12-06 – 2014-12-12 (×7): 0.5 mg via ORAL
  Filled 2014-12-06 (×7): qty 1

## 2014-12-06 MED ORDER — FLUOROMETHOLONE 0.1 % OP SUSP
1.0000 [drp] | Freq: Every day | OPHTHALMIC | Status: DC | PRN
  Filled 2014-12-06: qty 5

## 2014-12-06 MED ORDER — TRANEXAMIC ACID 100 MG/ML IV SOLN
2000.0000 mg | INTRAVENOUS | Status: DC | PRN
  Administered 2014-12-06: 2000 mg via TOPICAL

## 2014-12-06 MED ORDER — 0.9 % SODIUM CHLORIDE (POUR BTL) OPTIME
TOPICAL | Status: DC | PRN
  Administered 2014-12-06: 1000 mL

## 2014-12-06 MED ORDER — BISACODYL 10 MG RE SUPP: 10.0000 mg | Freq: Every day | RECTAL | Status: DC | PRN

## 2014-12-06 MED ORDER — ONDANSETRON HCL 4 MG PO TABS
4.0000 mg | ORAL_TABLET | Freq: Four times a day (QID) | ORAL | Status: DC | PRN
  Administered 2014-12-06 – 2014-12-09 (×2): 4 mg via ORAL
  Filled 2014-12-06 (×2): qty 1

## 2014-12-06 MED ORDER — ACETAMINOPHEN 10 MG/ML IV SOLN
1000.0000 mg | Freq: Once | INTRAVENOUS | Status: DC
  Filled 2014-12-06: qty 100

## 2014-12-06 MED ORDER — PROMETHAZINE HCL 25 MG/ML IJ SOLN
6.2500 mg | Freq: Four times a day (QID) | INTRAMUSCULAR | Status: AC | PRN
  Administered 2014-12-06: 6.25 mg via INTRAVENOUS

## 2014-12-06 MED ORDER — SUMATRIPTAN SUCCINATE 100 MG PO TABS
100.0000 mg | ORAL_TABLET | ORAL | Status: DC | PRN
  Administered 2014-12-09 – 2014-12-10 (×2): 100 mg via ORAL
  Filled 2014-12-06 (×4): qty 1

## 2014-12-06 MED ORDER — SODIUM CHLORIDE 0.9 % IV SOLN: INTRAVENOUS | Status: DC

## 2014-12-06 MED ORDER — ONDANSETRON HCL 4 MG/2ML IJ SOLN
4.0000 mg | Freq: Four times a day (QID) | INTRAMUSCULAR | Status: DC | PRN
  Administered 2014-12-06 – 2014-12-11 (×4): 4 mg via INTRAVENOUS
  Filled 2014-12-06 (×4): qty 2

## 2014-12-06 MED ORDER — BUPIVACAINE HCL (PF) 0.25 % IJ SOLN
INTRAMUSCULAR | Status: AC
  Filled 2014-12-06: qty 30

## 2014-12-06 MED ORDER — VALACYCLOVIR HCL 500 MG PO TABS
2000.0000 mg | ORAL_TABLET | Freq: Two times a day (BID) | ORAL | Status: DC | PRN
  Filled 2014-12-06: qty 4

## 2014-12-06 MED ORDER — LEVOTHYROXINE SODIUM 25 MCG PO TABS
25.0000 ug | ORAL_TABLET | Freq: Every day | ORAL | Status: DC
  Administered 2014-12-07 – 2014-12-13 (×7): 25 ug via ORAL
  Filled 2014-12-06 (×10): qty 1

## 2014-12-06 MED ORDER — DEXAMETHASONE SODIUM PHOSPHATE 10 MG/ML IJ SOLN
INTRAMUSCULAR | Status: AC
  Filled 2014-12-06: qty 1

## 2014-12-06 MED ORDER — METOCLOPRAMIDE HCL 5 MG/ML IJ SOLN
5.0000 mg | Freq: Three times a day (TID) | INTRAMUSCULAR | Status: DC | PRN
  Administered 2014-12-06 – 2014-12-08 (×4): 10 mg via INTRAVENOUS
  Filled 2014-12-06 (×4): qty 2

## 2014-12-06 MED ORDER — ACETAMINOPHEN 500 MG PO TABS
1000.0000 mg | ORAL_TABLET | Freq: Four times a day (QID) | ORAL | Status: AC
  Administered 2014-12-06 – 2014-12-07 (×3): 1000 mg via ORAL
  Filled 2014-12-06 (×3): qty 2

## 2014-12-06 MED ORDER — BUPIVACAINE HCL 0.25 % IJ SOLN
INTRAMUSCULAR | Status: DC | PRN
  Administered 2014-12-06: 20 mL

## 2014-12-06 MED ORDER — HYDROMORPHONE HCL 1 MG/ML IJ SOLN
0.5000 mg | INTRAMUSCULAR | Status: DC | PRN
  Administered 2014-12-06 – 2014-12-07 (×5): 1 mg via INTRAVENOUS
  Filled 2014-12-06 (×5): qty 1

## 2014-12-06 MED ORDER — PROPOFOL 10 MG/ML IV BOLUS
INTRAVENOUS | Status: AC
  Filled 2014-12-06: qty 20

## 2014-12-06 MED ORDER — DIPHENHYDRAMINE HCL 12.5 MG/5ML PO ELIX: 12.5000 mg | ORAL_SOLUTION | ORAL | Status: DC | PRN

## 2014-12-06 MED ORDER — LACTATED RINGERS IV SOLN
INTRAVENOUS | Status: DC | PRN
  Administered 2014-12-06 (×2): via INTRAVENOUS

## 2014-12-06 MED ORDER — GABAPENTIN 800 MG PO TABS: 800.0000 mg | ORAL_TABLET | Freq: Two times a day (BID) | ORAL | Status: DC

## 2014-12-06 MED ORDER — LACTATED RINGERS IV SOLN
INTRAVENOUS | Status: DC
  Administered 2014-12-06: 1000 mL via INTRAVENOUS

## 2014-12-06 MED ORDER — ALBUTEROL SULFATE (2.5 MG/3ML) 0.083% IN NEBU: 2.5000 mg | INHALATION_SOLUTION | Freq: Four times a day (QID) | RESPIRATORY_TRACT | Status: DC | PRN

## 2014-12-06 MED ORDER — PHENYLEPHRINE 40 MCG/ML (10ML) SYRINGE FOR IV PUSH (FOR BLOOD PRESSURE SUPPORT)
PREFILLED_SYRINGE | INTRAVENOUS | Status: AC
  Filled 2014-12-06: qty 10

## 2014-12-06 MED ORDER — CEFAZOLIN SODIUM-DEXTROSE 2-3 GM-% IV SOLR
INTRAVENOUS | Status: AC
  Filled 2014-12-06: qty 50

## 2014-12-06 MED ORDER — CEFAZOLIN SODIUM-DEXTROSE 2-3 GM-% IV SOLR
2.0000 g | INTRAVENOUS | Status: AC
  Administered 2014-12-06: 2 g via INTRAVENOUS

## 2014-12-06 MED ORDER — PROPOFOL INFUSION 10 MG/ML OPTIME
INTRAVENOUS | Status: DC | PRN
  Administered 2014-12-06: 100 ug/kg/min via INTRAVENOUS

## 2014-12-06 MED ORDER — HYDROMORPHONE HCL 1 MG/ML IJ SOLN
0.5000 mg | INTRAMUSCULAR | Status: DC | PRN
  Administered 2014-12-06 (×2): 0.5 mg via INTRAVENOUS
  Filled 2014-12-06 (×2): qty 1

## 2014-12-06 MED ORDER — MIDAZOLAM HCL 2 MG/2ML IJ SOLN
INTRAMUSCULAR | Status: AC
  Filled 2014-12-06: qty 2

## 2014-12-06 MED ORDER — RIVAROXABAN 10 MG PO TABS
10.0000 mg | ORAL_TABLET | Freq: Every day | ORAL | Status: DC
  Administered 2014-12-07 – 2014-12-09 (×3): 10 mg via ORAL
  Filled 2014-12-06 (×5): qty 1

## 2014-12-06 MED ORDER — PROPOFOL 10 MG/ML IV BOLUS
INTRAVENOUS | Status: DC | PRN
  Administered 2014-12-06: 20 mg via INTRAVENOUS

## 2014-12-06 MED ORDER — HYDROMORPHONE HCL 1 MG/ML IJ SOLN: 0.2500 mg | INTRAMUSCULAR | Status: DC | PRN

## 2014-12-06 MED ORDER — METHOCARBAMOL 1000 MG/10ML IJ SOLN
500.0000 mg | Freq: Four times a day (QID) | INTRAVENOUS | Status: DC | PRN
  Administered 2014-12-06: 500 mg via INTRAVENOUS
  Filled 2014-12-06 (×2): qty 5

## 2014-12-06 MED ORDER — SODIUM CHLORIDE 0.9 % IR SOLN
Status: DC | PRN
  Administered 2014-12-06: 1000 mL

## 2014-12-06 MED ORDER — ACETAMINOPHEN 10 MG/ML IV SOLN
INTRAVENOUS | Status: DC | PRN
  Administered 2014-12-06: 1000 mg via INTRAVENOUS

## 2014-12-06 MED ORDER — SODIUM CHLORIDE 0.9 % IV SOLN
INTRAVENOUS | Status: DC
  Administered 2014-12-06 – 2014-12-08 (×4): via INTRAVENOUS
  Administered 2014-12-09: 50 mL/h via INTRAVENOUS

## 2014-12-06 MED ORDER — METOCLOPRAMIDE HCL 5 MG/ML IJ SOLN: 10.0000 mg | Freq: Once | INTRAMUSCULAR | Status: AC

## 2014-12-06 MED ORDER — TRANEXAMIC ACID 100 MG/ML IV SOLN
2000.0000 mg | Freq: Once | INTRAVENOUS | Status: DC
  Filled 2014-12-06: qty 20

## 2014-12-06 MED ORDER — PROMETHAZINE HCL 25 MG PO TABS
25.0000 mg | ORAL_TABLET | Freq: Four times a day (QID) | ORAL | Status: DC | PRN
  Administered 2014-12-08 (×2): 25 mg via ORAL
  Filled 2014-12-06 (×2): qty 1

## 2014-12-06 MED ORDER — INSULIN ASPART 100 UNIT/ML ~~LOC~~ SOLN
0.0000 [IU] | Freq: Three times a day (TID) | SUBCUTANEOUS | Status: DC
  Administered 2014-12-06: 5 [IU] via SUBCUTANEOUS
  Administered 2014-12-07 (×2): 2 [IU] via SUBCUTANEOUS
  Administered 2014-12-07: 3 [IU] via SUBCUTANEOUS

## 2014-12-06 MED ORDER — TRAMADOL HCL 50 MG PO TABS
50.0000 mg | ORAL_TABLET | Freq: Four times a day (QID) | ORAL | Status: DC | PRN
  Administered 2014-12-08 – 2014-12-09 (×4): 100 mg via ORAL
  Filled 2014-12-06 (×5): qty 2

## 2014-12-06 MED ORDER — ONDANSETRON 4 MG PO TBDP
8.0000 mg | ORAL_TABLET | Freq: Three times a day (TID) | ORAL | Status: DC | PRN
  Administered 2014-12-08 (×2): 8 mg via ORAL
  Filled 2014-12-06 (×2): qty 2

## 2014-12-06 MED ORDER — BUPIVACAINE LIPOSOME 1.3 % IJ SUSP
INTRAMUSCULAR | Status: DC | PRN
  Administered 2014-12-06: 20 mL

## 2014-12-06 MED ORDER — SUMATRIPTAN SUCCINATE 6 MG/0.5ML ~~LOC~~ SOLN
6.0000 mg | SUBCUTANEOUS | Status: DC | PRN
Start: 2014-12-06 — End: 2014-12-13
  Administered 2014-12-06 – 2014-12-08 (×5): 6 mg via SUBCUTANEOUS
  Filled 2014-12-06 (×8): qty 0.5

## 2014-12-06 MED ORDER — FLEET ENEMA 7-19 GM/118ML RE ENEM: 1.0000 | ENEMA | Freq: Once | RECTAL | Status: AC | PRN

## 2014-12-06 MED ORDER — LINAGLIPTIN 5 MG PO TABS
5.0000 mg | ORAL_TABLET | Freq: Every day | ORAL | Status: DC
  Administered 2014-12-07 – 2014-12-09 (×3): 5 mg via ORAL
  Filled 2014-12-06 (×3): qty 1

## 2014-12-06 MED ORDER — ACETAMINOPHEN 325 MG PO TABS
650.0000 mg | ORAL_TABLET | Freq: Four times a day (QID) | ORAL | Status: DC | PRN
  Administered 2014-12-10 – 2014-12-13 (×5): 650 mg via ORAL
  Filled 2014-12-06 (×5): qty 2

## 2014-12-06 MED ORDER — METHOCARBAMOL 500 MG PO TABS
500.0000 mg | ORAL_TABLET | Freq: Four times a day (QID) | ORAL | Status: DC | PRN
  Administered 2014-12-07 – 2014-12-10 (×4): 500 mg via ORAL
  Filled 2014-12-06 (×3): qty 1

## 2014-12-06 MED ORDER — CEFAZOLIN SODIUM-DEXTROSE 2-3 GM-% IV SOLR
2.0000 g | Freq: Four times a day (QID) | INTRAVENOUS | Status: AC
  Administered 2014-12-06 (×2): 2 g via INTRAVENOUS
  Filled 2014-12-06 (×2): qty 50

## 2014-12-06 MED ORDER — PROMETHAZINE HCL 25 MG/ML IJ SOLN
INTRAMUSCULAR | Status: AC
  Filled 2014-12-06: qty 1

## 2014-12-06 MED ORDER — BENZONATATE 100 MG PO CAPS
100.0000 mg | ORAL_CAPSULE | Freq: Three times a day (TID) | ORAL | Status: DC | PRN
  Administered 2014-12-07 – 2014-12-13 (×7): 100 mg via ORAL
  Filled 2014-12-06 (×8): qty 1

## 2014-12-06 MED ORDER — MOMETASONE FURO-FORMOTEROL FUM 100-5 MCG/ACT IN AERO
2.0000 | INHALATION_SPRAY | Freq: Two times a day (BID) | RESPIRATORY_TRACT | Status: DC
  Administered 2014-12-06 – 2014-12-13 (×14): 2 via RESPIRATORY_TRACT
  Filled 2014-12-06: qty 8.8

## 2014-12-06 MED ORDER — MENTHOL 3 MG MT LOZG: 1.0000 | LOZENGE | OROMUCOSAL | Status: DC | PRN

## 2014-12-06 MED ORDER — ACETAMINOPHEN 650 MG RE SUPP: 650.0000 mg | Freq: Four times a day (QID) | RECTAL | Status: DC | PRN

## 2014-12-06 MED ORDER — DOCUSATE SODIUM 100 MG PO CAPS
100.0000 mg | ORAL_CAPSULE | Freq: Two times a day (BID) | ORAL | Status: DC
  Administered 2014-12-06 – 2014-12-08 (×4): 100 mg via ORAL

## 2014-12-06 MED ORDER — CYCLOBENZAPRINE HCL 10 MG PO TABS
10.0000 mg | ORAL_TABLET | Freq: Every day | ORAL | Status: DC
  Administered 2014-12-06 – 2014-12-09 (×4): 10 mg via ORAL
  Filled 2014-12-06 (×6): qty 1

## 2014-12-06 MED ORDER — SUMATRIPTAN SUCCINATE 6 MG/0.5ML ~~LOC~~ SOLN
6.0000 mg | SUBCUTANEOUS | Status: DC | PRN
  Administered 2014-12-09: 6 mg via SUBCUTANEOUS
  Filled 2014-12-06 (×3): qty 0.5

## 2014-12-06 MED ORDER — PHENOL 1.4 % MT LIQD
1.0000 | OROMUCOSAL | Status: DC | PRN
  Filled 2014-12-06: qty 177

## 2014-12-06 MED ORDER — SODIUM CHLORIDE 0.9 % IJ SOLN
INTRAMUSCULAR | Status: DC | PRN
  Administered 2014-12-06: 30 mL via INTRAVENOUS

## 2014-12-06 MED ORDER — SODIUM CHLORIDE 0.9 % IJ SOLN
INTRAMUSCULAR | Status: AC
  Filled 2014-12-06: qty 50

## 2014-12-06 MED ORDER — ALBUTEROL SULFATE (2.5 MG/3ML) 0.083% IN NEBU
3.0000 mL | INHALATION_SOLUTION | Freq: Four times a day (QID) | RESPIRATORY_TRACT | Status: DC | PRN
  Administered 2014-12-09: 3 mL via RESPIRATORY_TRACT
  Filled 2014-12-06: qty 3

## 2014-12-06 MED ORDER — POLYETHYLENE GLYCOL 3350 17 G PO PACK: 17.0000 g | PACK | Freq: Every day | ORAL | Status: DC | PRN

## 2014-12-06 MED ORDER — NYSTATIN 100000 UNIT/ML MT SUSP
5.0000 mL | Freq: Every day | OROMUCOSAL | Status: DC | PRN
  Filled 2014-12-06: qty 5

## 2014-12-06 MED ORDER — FENTANYL CITRATE 0.05 MG/ML IJ SOLN
INTRAMUSCULAR | Status: AC
  Filled 2014-12-06: qty 2

## 2014-12-06 MED ORDER — PANTOPRAZOLE SODIUM 40 MG PO TBEC
40.0000 mg | DELAYED_RELEASE_TABLET | Freq: Every day | ORAL | Status: DC
  Administered 2014-12-07 – 2014-12-13 (×6): 40 mg via ORAL
  Filled 2014-12-06 (×7): qty 1

## 2014-12-06 MED ORDER — PROMETHAZINE HCL 25 MG/ML IJ SOLN
INTRAMUSCULAR | Status: DC | PRN
  Administered 2014-12-06 (×2): 6.25 mg via INTRAVENOUS

## 2014-12-06 MED ORDER — FLUTICASONE PROPIONATE 50 MCG/ACT NA SUSP
1.0000 | Freq: Every day | NASAL | Status: DC | PRN
  Filled 2014-12-06: qty 16

## 2014-12-06 MED ORDER — GABAPENTIN 400 MG PO CAPS
1600.0000 mg | ORAL_CAPSULE | Freq: Every day | ORAL | Status: DC
  Administered 2014-12-06 – 2014-12-12 (×7): 1600 mg via ORAL
  Filled 2014-12-06 (×8): qty 4

## 2014-12-06 SURGICAL SUPPLY — 59 items
BAG DECANTER FOR FLEXI CONT (MISCELLANEOUS) ×2 IMPLANT
BAG ZIPLOCK 12X15 (MISCELLANEOUS) ×2 IMPLANT
BANDAGE ELASTIC 6 VELCRO ST LF (GAUZE/BANDAGES/DRESSINGS) ×2 IMPLANT
BANDAGE ESMARK 6X9 LF (GAUZE/BANDAGES/DRESSINGS) ×1 IMPLANT
BLADE SAG 18X100X1.27 (BLADE) ×2 IMPLANT
BLADE SAW SGTL 11.0X1.19X90.0M (BLADE) ×2 IMPLANT
BNDG ESMARK 6X9 LF (GAUZE/BANDAGES/DRESSINGS) ×2
BOWL SMART MIX CTS (DISPOSABLE) ×2 IMPLANT
CAP KNEE TOTAL 3 SIGMA ×2 IMPLANT
CEMENT HV SMART SET (Cement) ×4 IMPLANT
CUFF TOURN SGL QUICK 34 (TOURNIQUET CUFF) ×1
CUFF TRNQT CYL 34X4X40X1 (TOURNIQUET CUFF) ×1 IMPLANT
DECANTER SPIKE VIAL GLASS SM (MISCELLANEOUS) ×2 IMPLANT
DRAPE EXTREMITY T 121X128X90 (DRAPE) ×2 IMPLANT
DRAPE POUCH INSTRU U-SHP 10X18 (DRAPES) ×2 IMPLANT
DRAPE U-SHAPE 47X51 STRL (DRAPES) ×2 IMPLANT
DRSG ADAPTIC 3X8 NADH LF (GAUZE/BANDAGES/DRESSINGS) ×2 IMPLANT
DRSG PAD ABDOMINAL 8X10 ST (GAUZE/BANDAGES/DRESSINGS) ×2 IMPLANT
DURAPREP 26ML APPLICATOR (WOUND CARE) ×2 IMPLANT
ELECT REM PT RETURN 9FT ADLT (ELECTROSURGICAL) ×2
ELECTRODE REM PT RTRN 9FT ADLT (ELECTROSURGICAL) ×1 IMPLANT
EVACUATOR 1/8 PVC DRAIN (DRAIN) ×2 IMPLANT
FACESHIELD WRAPAROUND (MASK) ×10 IMPLANT
GAUZE SPONGE 4X4 12PLY STRL (GAUZE/BANDAGES/DRESSINGS) ×2 IMPLANT
GLOVE BIO SURGEON STRL SZ7.5 (GLOVE) IMPLANT
GLOVE BIO SURGEON STRL SZ8 (GLOVE) ×2 IMPLANT
GLOVE BIOGEL PI IND STRL 6.5 (GLOVE) IMPLANT
GLOVE BIOGEL PI IND STRL 8 (GLOVE) ×1 IMPLANT
GLOVE BIOGEL PI INDICATOR 6.5 (GLOVE)
GLOVE BIOGEL PI INDICATOR 8 (GLOVE) ×1
GLOVE SURG SS PI 6.5 STRL IVOR (GLOVE) IMPLANT
GOWN STRL REUS W/TWL LRG LVL3 (GOWN DISPOSABLE) ×2 IMPLANT
GOWN STRL REUS W/TWL XL LVL3 (GOWN DISPOSABLE) IMPLANT
HANDPIECE INTERPULSE COAX TIP (DISPOSABLE) ×1
IMMOBILIZER KNEE 20 (SOFTGOODS) ×2 IMPLANT
KIT BASIN OR (CUSTOM PROCEDURE TRAY) ×2 IMPLANT
MANIFOLD NEPTUNE II (INSTRUMENTS) ×2 IMPLANT
NDL SAFETY ECLIPSE 18X1.5 (NEEDLE) ×2 IMPLANT
NEEDLE HYPO 18GX1.5 SHARP (NEEDLE) ×2
NS IRRIG 1000ML POUR BTL (IV SOLUTION) ×2 IMPLANT
PACK TOTAL JOINT (CUSTOM PROCEDURE TRAY) ×2 IMPLANT
PADDING CAST COTTON 6X4 STRL (CAST SUPPLIES) ×2 IMPLANT
PEN SKIN MARKING BROAD (MISCELLANEOUS) ×2 IMPLANT
POSITIONER SURGICAL ARM (MISCELLANEOUS) ×2 IMPLANT
SET HNDPC FAN SPRY TIP SCT (DISPOSABLE) ×1 IMPLANT
STRIP CLOSURE SKIN 1/2X4 (GAUZE/BANDAGES/DRESSINGS) ×2 IMPLANT
SUCTION FRAZIER 12FR DISP (SUCTIONS) ×2 IMPLANT
SUT MNCRL AB 4-0 PS2 18 (SUTURE) ×2 IMPLANT
SUT VIC AB 2-0 CT1 27 (SUTURE) ×4
SUT VIC AB 2-0 CT1 TAPERPNT 27 (SUTURE) ×4 IMPLANT
SUT VLOC 180 0 24IN GS25 (SUTURE) ×2 IMPLANT
SYR 20CC LL (SYRINGE) ×2 IMPLANT
SYR 50ML LL SCALE MARK (SYRINGE) ×2 IMPLANT
TOWEL OR 17X26 10 PK STRL BLUE (TOWEL DISPOSABLE) ×2 IMPLANT
TOWEL OR NON WOVEN STRL DISP B (DISPOSABLE) IMPLANT
TRAY FOLEY CATH 14FRSI W/METER (CATHETERS) ×2 IMPLANT
WATER STERILE IRR 1500ML POUR (IV SOLUTION) ×2 IMPLANT
WRAP KNEE MAXI GEL POST OP (GAUZE/BANDAGES/DRESSINGS) ×2 IMPLANT
YANKAUER SUCT BULB TIP 10FT TU (MISCELLANEOUS) ×2 IMPLANT

## 2014-12-06 NOTE — Evaluation (Signed)
Physical Therapy Evaluation Patient Details Name: Hayley [name] MRN: [id number] DOB: [birth year] Today's Date: 12/06/2014   History of Present Illness  R TKA  Clinical Impression  Pt admitted with TKA. Pt currently with functional limitations due to the deficits listed below (see PT Problem List). Pt ambulated 8' with RW, distance limited by pain. Good progress expected. Pt will benefit from skilled PT to increase their independence and safety with mobility to allow discharge to the venue listed below.       Follow Up Recommendations SNF    Equipment Recommendations  None recommended by PT    Recommendations for Other Services OT consult     Precautions / Restrictions Precautions Precautions: Knee Restrictions Weight Bearing Restrictions: No      Mobility  Bed Mobility Overal bed mobility: Needs Assistance Bed Mobility: Supine to Sit     Supine to sit: Min assist     General bed mobility comments: min A for RLE  Transfers Overall transfer level: Needs assistance Equipment used: Rolling walker (2 wheeled) Transfers: Sit to/from Stand Sit to Stand: Min assist         General transfer comment: assist to rise  Ambulation/Gait Ambulation/Gait assistance: Min guard Ambulation Distance (Feet): 8 Feet Assistive device: Rolling walker (2 wheeled) Gait Pattern/deviations: Step-to pattern;Antalgic;Decreased step length - right   Gait velocity interpretation: Below normal speed for age/gender    Stairs            Wheelchair Mobility    Modified Rankin (Stroke Patients Only)       Balance                                             Pertinent Vitals/Pain Pain Assessment: 0-10 Pain Score: 5  Pain Location: R knee with walking Pain Descriptors / Indicators: Sore Pain Intervention(s): Monitored during session;Premedicated before session;Limited activity within patient's tolerance;Ice applied    Home Living Family/patient expects to be  discharged to:: Skilled nursing facility                 Additional Comments: lives with spouse who works, she plans to DC to SNF.  she has a RW, tub with seat and 3:1 commode    Prior Function Level of Independence: Independent               Hand Dominance        Extremity/Trunk Assessment               Lower Extremity Assessment: RLE deficits/detail RLE Deficits / Details: 5-40* R knee AAROM    Cervical / Trunk Assessment: Normal  Communication   Communication: No difficulties  Cognition Arousal/Alertness: Awake/alert Behavior During Therapy: WFL for tasks assessed/performed Overall Cognitive Status: Within Functional Limits for tasks assessed                      General Comments      Exercises Total Joint Exercises Ankle Circles/Pumps: AROM;Both;10 reps Quad Sets: AROM;Both;10 reps Heel Slides: AAROM;Right;10 reps;Seated      Assessment/Plan    PT Assessment Patient needs continued PT services  PT Diagnosis Difficulty walking;Acute pain   PT Problem List Decreased strength;Decreased range of motion;Decreased activity tolerance;Pain;Decreased knowledge of use of DME;Decreased mobility;Obesity  PT Treatment Interventions DME instruction;Gait training;Functional mobility training;Therapeutic activities;Patient/family education;Therapeutic exercise   PT Goals (Current goals can be  found in the Care Plan section) Acute Rehab PT Goals Patient Stated Goal: to dance (clogging, tap dance) PT Goal Formulation: With patient Time For Goal Achievement: 12/20/14 Potential to Achieve Goals: Good    Frequency 7X/week   Barriers to discharge        Co-evaluation               End of Session Equipment Utilized During Treatment: Gait belt Activity Tolerance: Patient tolerated treatment well Patient left: in chair;with call bell/phone within reach Nurse Communication: Mobility status         Time: 1547-1610 PT Time Calculation  (min) (ACUTE ONLY): 23 min   Charges:   PT Evaluation $Initial PT Evaluation Tier I: 1 Procedure PT Treatments $Gait Training: 8-22 mins   PT G Codes:        Philomena Doheny 12/06/2014, 5:12 PM [phone]

## 2014-12-06 NOTE — Progress Notes (Signed)
RT set up CPAP for patient

## 2014-12-06 NOTE — Interval H&P Note (Signed)
History and Physical Interval Note:  12/06/2014 7:13 AM  Hayley [name]  has presented today for surgery, with the diagnosis of right knee osteoarthritis  The various methods of treatment have been discussed with the patient and family. After consideration of risks, benefits and other options for treatment, the patient has consented to  Procedure(s): RIGHT TOTAL KNEE ARTHROPLASTY (Right) as a surgical intervention .  The patient's history has been reviewed, patient examined, no change in status, stable for surgery.  I have reviewed the patient's chart and labs.  Questions were answered to the patient's satisfaction.     Gearlean Alf

## 2014-12-06 NOTE — Progress Notes (Signed)
Utilization review completed.  

## 2014-12-06 NOTE — Anesthesia Procedure Notes (Signed)
Spinal Patient location during procedure: OR Start time: 12/06/2014 9:12 AM End time: 12/06/2014 9:17 AM Reason for block: at surgeon's request Staffing Resident/CRNA: Anne Fu Performed by: resident/CRNA  Preanesthetic Checklist Completed: patient identified, site marked, surgical consent, pre-op evaluation, timeout performed, IV checked, risks and benefits discussed, monitors and equipment checked and at surgeon's request Spinal Block Patient position: sitting Approach: right paramedian Location: L2-3 Injection technique: single-shot Needle Needle type: Spinocan  Needle gauge: 22 G Needle length: 9 cm Assessment Sensory level: T6 Additional Notes Expiration date of kit checked and confirmed. Patient tolerated procedure well, without complications. X 1 attempt with noted clear CSF return. Loss of motor and sensory on exam post injection.

## 2014-12-06 NOTE — Anesthesia Preprocedure Evaluation (Signed)
Anesthesia Evaluation  Patient identified by MRN, date of birth, ID band Patient awake    Reviewed: Allergy & Precautions, NPO status , Patient's Chart, lab work & pertinent test results  Airway Mallampati: II  TM Distance: >3 FB Neck ROM: Full    Dental no notable dental hx.    Pulmonary shortness of breath and with exertion, asthma , sleep apnea and Continuous Positive Airway Pressure Ventilation , former smoker,  breath sounds clear to auscultation  Pulmonary exam normal       Cardiovascular Exercise Tolerance: Good Rhythm:Regular Rate:Normal     Neuro/Psych  Headaches, PSYCHIATRIC DISORDERS Anxiety Depression Recent change in migraine medicine (from cymbalta to zoloft and discontinuing primidone). She had a migraine last night and has been nauseated since. Took Zofran PO at home.  Neuromuscular disease    GI/Hepatic Neg liver ROS, GERD-  Medicated,  Endo/Other  diabetes, Type 2, Oral Hypoglycemic AgentsHypothyroidism Hyperthyroidism   Renal/GU Renal disease  negative genitourinary   Musculoskeletal  (+) Arthritis -,   Abdominal (+) + obese,   Peds negative pediatric ROS (+)  Hematology  (+) anemia ,   Anesthesia Other Findings   Reproductive/Obstetrics negative OB ROS                             Anesthesia Physical Anesthesia Plan  ASA: III  Anesthesia Plan: Spinal   Post-op Pain Management:    Induction: Intravenous  Airway Management Planned:   Additional Equipment:   Intra-op Plan:   Post-operative Plan: Extubation in OR  Informed Consent: I have reviewed the patients History and Physical, chart, labs and discussed the procedure including the risks, benefits and alternatives for the proposed anesthesia with the patient or authorized representative who has indicated his/her understanding and acceptance.   Dental advisory given  Plan Discussed with: CRNA  Anesthesia  Plan Comments: (Discussed risks and benefits of and differences between spinal and general. Discussed risks of spinal including headache, backache, failure, bleeding, infection, and nerve damage. Patient consents to spinal. Questions answered. Coagulation studies and platelet count acceptable.)        Anesthesia Quick Evaluation

## 2014-12-06 NOTE — Anesthesia Postprocedure Evaluation (Signed)
  Anesthesia Post-op Note  Patient: Hayley [name]  Procedure(s) Performed: Procedure(s) (LRB): RIGHT TOTAL KNEE ARTHROPLASTY (Right)  Patient Location: PACU  Anesthesia Type: Spinal  Level of Consciousness: awake and alert   Airway and Oxygen Therapy: Patient Spontanous Breathing  Post-op Pain: mild  Post-op Assessment: Post-op Vital signs reviewed, Patient's Cardiovascular Status Stable, Respiratory Function Stable, Patent Airway and No signs of Nausea or vomiting  Last Vitals:  Filed Vitals:   12/06/14 1303  BP: 125/79  Pulse: 101  Temp: 36.6 C  Resp: 18    Post-op Vital Signs: stable   Complications: No apparent anesthesia complications

## 2014-12-06 NOTE — Transfer of Care (Signed)
Immediate Anesthesia Transfer of Care Note  Patient: Hayley [name]  Procedure(s) Performed: Procedure(s): RIGHT TOTAL KNEE ARTHROPLASTY (Right)  Patient Location: PACU  Anesthesia Type:Spinal  Level of Consciousness: awake, alert  and oriented  Airway & Oxygen Therapy: Patient Spontanous Breathing and Patient connected to face mask oxygen  Post-op Assessment: Report given to RN and Post -op Vital signs reviewed and stable  Post vital signs: Reviewed and stable  Last Vitals:  Filed Vitals:   12/06/14 0653  BP: 139/75  Pulse: 117  Temp: 37 C  Resp: 18    Complications: No apparent anesthesia complications

## 2014-12-06 NOTE — Op Note (Signed)
Pre-operative diagnosis- Osteoarthritis  Right knee(s)  Post-operative diagnosis- Osteoarthritis Right knee(s)  Procedure-  Right  Total Knee Arthroplasty  Surgeon- Dione Plover. Kashon Kraynak, MD  Assistant- Ardeen Jourdain, PA-C   Anesthesia-  Spinal  EBL-* No blood loss amount entered *   Drains Hemovac  Tourniquet time-  Total Tourniquet Time Documented: Thigh (Right) - 30 minutes Total: Thigh (Right) - 30 minutes     Complications- None  Condition-PACU - hemodynamically stable.   Brief Clinical Note  Hayley [name] is a 69 y.o. year old female with end stage OA of her right knee with progressively worsening pain and dysfunction. She has constant pain, with activity and at rest and significant functional deficits with difficulties even with ADLs. She has had extensive non-op management including analgesics, injections of cortisone and viscosupplements, and home exercise program, but remains in significant pain with significant dysfunction.Radiographs show bone on bone arthritis lateral and patellofemoral without deformity. She presents now for right Total Knee Arthroplasty.    Procedure in detail---   The patient is brought into the operating room and positioned supine on the operating table. After successful administration of  Spinal,   a tourniquet is placed high on the  Right thigh(s) and the lower extremity is prepped and draped in the usual sterile fashion. Time out is performed by the operating team and then the  Right lower extremity is wrapped in Esmarch, knee flexed and the tourniquet inflated to 300 mmHg.       A midline incision is made with a ten blade through the subcutaneous tissue to the level of the extensor mechanism. A fresh blade is used to make a medial parapatellar arthrotomy. Soft tissue over the proximal medial tibia is subperiosteally elevated to the joint line with a knife and into the semimembranosus bursa with a Cobb elevator. Soft tissue over the proximal lateral  tibia is elevated with attention being paid to avoiding the patellar tendon on the tibial tubercle. The patella is everted, knee flexed 90 degrees and the ACL and PCL are removed. Findings are bone on bone lateral and patellofemoral with large global osteophytes.        The drill is used to create a starting hole in the distal femur and the canal is thoroughly irrigated with sterile saline to remove the fatty contents. The 5 degree Right  valgus alignment guide is placed into the femoral canal and the distal femoral cutting block is pinned to remove 10 mm off the distal femur. Resection is made with an oscillating saw.      The tibia is subluxed forward and the menisci are removed. The extramedullary alignment guide is placed referencing proximally at the medial aspect of the tibial tubercle and distally along the second metatarsal axis and tibial crest. The block is pinned to remove 34mm off the more deficient lateral  side. Resection is made with an oscillating saw. Size 3is the most appropriate size for the tibia and the proximal tibia is prepared with the modular drill and keel punch for that size.      The femoral sizing guide is placed and size 3 is most appropriate. Rotation is marked off the epicondylar axis and confirmed by creating a rectangular flexion gap at 90 degrees. The size 3 cutting block is pinned in this rotation and the anterior, posterior and chamfer cuts are made with the oscillating saw. The intercondylar block is then placed and that cut is made.      Trial size 3 tibial  component, trial size 3 posterior stabilized femur and a 12.5  mm posterior stabilized rotating platform insert trial is placed. Full extension is achieved with excellent varus/valgus and anterior/posterior balance throughout full range of motion. The patella is everted and thickness measured to be 22  mm. Free hand resection is taken to 12 mm, a 35 template is placed, lug holes are drilled, trial patella is placed, and  it tracks normally. Osteophytes are removed off the posterior femur with the trial in place. All trials are removed and the cut bone surfaces prepared with pulsatile lavage. Cement is mixed and once ready for implantation, the size 3 tibial implant, size  3 posterior stabilized femoral component, and the size 35 patella are cemented in place and the patella is held with the clamp. The trial insert is placed and the knee held in full extension. The Exparel (20 ml mixed with 30 ml saline) and .25% Bupivicaine, are injected into the extensor mechanism, posterior capsule, medial and lateral gutters and subcutaneous tissues.  All extruded cement is removed and once the cement is hard the permanent 12.5 mm posterior stabilized rotating platform insert is placed into the tibial tray.      The wound is copiously irrigated with saline solution and the extensor mechanism closed over a hemovac drain with #1 V-loc suture. The tourniquet is released for a total tourniquet time of 30  minutes. Flexion against gravity is 140 degrees and the patella tracks normally. Subcutaneous tissue is closed with 2.0 vicryl and subcuticular with running 4.0 Monocryl. The incision is cleaned and dried and steri-strips and a bulky sterile dressing are applied. The limb is placed into a knee immobilizer and the patient is awakened and transported to recovery in stable condition.      Please note that a surgical assistant was a medical necessity for this procedure in order to perform it in a safe and expeditious manner. Surgical assistant was necessary to retract the ligaments and vital neurovascular structures to prevent injury to them and also necessary for proper positioning of the limb to allow for anatomic placement of the prosthesis.   Dione Plover Huan Pollok, MD    12/06/2014, 10:12 AM

## 2014-12-06 NOTE — Progress Notes (Signed)
Husband has CPAP mask and tubing

## 2014-12-07 LAB — CBC
HCT: 37.9 % (ref 36.0–46.0)
Hemoglobin: 11.6 g/dL — ABNORMAL LOW (ref 12.0–15.0)
MCH: 25.8 pg — ABNORMAL LOW (ref 26.0–34.0)
MCHC: 30.6 g/dL (ref 30.0–36.0)
MCV: 84.2 fL (ref 78.0–100.0)
Platelets: 229 K/uL (ref 150–400)
RBC: 4.5 MIL/uL (ref 3.87–5.11)
RDW: 15.7 % — ABNORMAL HIGH (ref 11.5–15.5)
WBC: 6.3 K/uL (ref 4.0–10.5)

## 2014-12-07 LAB — GLUCOSE, CAPILLARY
Glucose-Capillary: 145 mg/dL — ABNORMAL HIGH (ref 70–99)
Glucose-Capillary: 142 mg/dL — ABNORMAL HIGH (ref 70–99)
Glucose-Capillary: 179 mg/dL — ABNORMAL HIGH (ref 70–99)
Glucose-Capillary: 144 mg/dL — ABNORMAL HIGH (ref 70–99)

## 2014-12-07 LAB — BASIC METABOLIC PANEL
Anion gap: 10 (ref 5–15)
BUN: 10 mg/dL (ref 6–23)
CO2: 24 mmol/L (ref 19–32)
Calcium: 8 mg/dL — ABNORMAL LOW (ref 8.4–10.5)
Chloride: 107 mmol/L (ref 96–112)
Creatinine, Ser: 0.57 mg/dL (ref 0.50–1.10)
GFR calc Af Amer: >90 mL/min (ref >90)
GFR calc non Af Amer: >90 mL/min (ref >90)
Glucose, Bld: 144 mg/dL — ABNORMAL HIGH (ref 70–99)
Potassium: 3.9 mmol/L (ref 3.5–5.1)
Sodium: 141 mmol/L (ref 135–145)

## 2014-12-07 MED ORDER — OXYCODONE HCL 5 MG PO TABS
5.0000 mg | ORAL_TABLET | ORAL | Status: DC | PRN
  Administered 2014-12-07 – 2014-12-08 (×4): 10 mg via ORAL
  Filled 2014-12-07 (×4): qty 2

## 2014-12-07 MED ORDER — PROMETHAZINE HCL 25 MG/ML IJ SOLN
6.2500 mg | Freq: Four times a day (QID) | INTRAMUSCULAR | Status: AC | PRN
  Administered 2014-12-07: 6.25 mg via INTRAVENOUS
  Filled 2014-12-07: qty 1

## 2014-12-07 NOTE — Care Management Note (Signed)
    Page 1 of 1   12/07/2014     4:04:07 PM CARE MANAGEMENT NOTE 12/07/2014  Patient:  Hayley [name], Hayley [name]   Account Number:  [id number]  Date Initiated:  12/07/2014  Documentation initiated by:  [facility]  Subjective/Objective Assessment:   adm: RIGHT TOTAL KNEE ARTHROPLASTY (Right)     Action/Plan:   discharge planning   Anticipated DC Date:  12/08/2014   Anticipated DC Plan:  SKILLED NURSING FACILITY         Choice offered to / List presented to:             Status of service:   Medicare Important Message given?   (If response is "NO", the following Medicare IM given date fields will be blank) Date Medicare IM given:   Medicare IM given by:   Date Additional Medicare IM given:   Additional Medicare IM given by:    Discharge Disposition:  [place]  Per UR Regulation:    If discussed at Long Length of Stay Meetings, dates discussed:    Comments:  12/07/14 16:00 CM notes pt to go to SNF fpr rehab; CSW arranging. No other CM needs were communicated.  Mariane Masters, BSn, CM [phone].

## 2014-12-07 NOTE — Progress Notes (Signed)
OT Cancellation Note  Patient Details Name: Hayley [name] MRN: [id number] DOB: [birth year]   Cancelled Treatment:    Reason Eval/Treat Not Completed: Other (comment).  Pt plans [facility] for rehab.  Will defer OT to that venue.  Zavier Canela 12/07/2014, 4:01 PM  Lesle Chris, OTR/L [phone] 12/07/2014

## 2014-12-07 NOTE — Progress Notes (Signed)
Physical Therapy Treatment Patient Details Name: Hayley [name] MRN: [id number] DOB: 26-Apr-1946 Today's Date: 12/07/2014    History of Present Illness R TKA    PT Comments    POD # 1 pm session.  Assisted with amb in hallway a greater distance.  Assisted to [facility] then back to bed for CPM.   Follow Up Recommendations  SNF     Equipment Recommendations  None recommended by PT    Recommendations for Other Services       Precautions / Restrictions Precautions Precautions: Knee;Fall Restrictions Weight Bearing Restrictions: No Other Position/Activity Restrictions: WBAT    Mobility  Bed Mobility Overal bed mobility: Needs Assistance Bed Mobility: Supine to Sit           General bed mobility comments: min A for RLE  Transfers Overall transfer level: Needs assistance Equipment used: Rolling walker (2 wheeled) Transfers: Sit to/from Stand Sit to Stand: Min guard;Min assist         General transfer comment: 25% VC's on proper tech and hand placement to push off  Ambulation/Gait Ambulation/Gait assistance: Supervision;Min guard Ambulation Distance (Feet): 65 Feet Assistive device: Rolling walker (2 wheeled) Gait Pattern/deviations: Step-to pattern;Decreased stance time - right Gait velocity: decreased   General Gait Details: 25% VC's on proper walker to self distance and safety with turns   Financial trader Rankin (Stroke Patients Only)       Balance                                    Cognition Arousal/Alertness: Awake/alert Behavior During Therapy: WFL for tasks assessed/performed Overall Cognitive Status: Within Functional Limits for tasks assessed                      Exercises      General Comments        Pertinent Vitals/Pain Pain Assessment: 0-10 Pain Score: 5  Pain Location: R knee Pain Descriptors / Indicators: Sore;Tightness Pain Intervention(s): Monitored during  session;Premedicated before session;Repositioned;Ice applied    Home Living                      Prior Function            PT Goals (current goals can now be found in the care plan section) Progress towards PT goals: Progressing toward goals    Frequency  7X/week    PT Plan      Co-evaluation             End of Session Equipment Utilized During Treatment: Gait belt;Right knee immobilizer Activity Tolerance: Patient tolerated treatment well Patient left: in chair;with call bell/phone within reach     Time: 1315-1340 PT Time Calculation (min) (ACUTE ONLY): 25 min  Charges:  $Gait Training: 8-22 mins $Therapeutic Activity: 8-22 mins                    G Codes:      Rica Koyanagi  PTA [facility]  Acute  Rehab Pager      [phone]

## 2014-12-07 NOTE — Progress Notes (Signed)
Clinical Social Work Department CLINICAL SOCIAL WORK PLACEMENT NOTE 12/07/2014  Patient:  RYLIE, LIMBURG  Account Number:  [id number] Admit date:  12/06/2014  Clinical Social Worker:  Werner Lean, LCSW  Date/time:  12/07/2014 02:17 PM  Clinical Social Work is seeking post-discharge placement for this patient at the following level of care:   SKILLED NURSING   (*CSW will update this form in Epic as items are completed)     Patient/family provided with [place] Department of Clinical Social Work's list of facilities offering this level of care within the geographic area requested by the patient (or if unable, by the patient's family).  12/07/2014  Patient/family informed of their freedom to choose among providers that offer the needed level of care, that participate in Medicare, Medicaid or managed care program needed by the patient, have an available bed and are willing to accept the patient.    Patient/family informed of [facility]' ownership interest in [facility], as well as of the fact that they are under no obligation to receive care at this facility.  PASARR submitted to EDS on 12/06/2014 PASARR number received on 12/06/2014  FL2 transmitted to all facilities in geographic area requested by pt/family on  12/06/2014 FL2 transmitted to all facilities within larger geographic area on   Patient informed that his/her managed care company has contracts with or will negotiate with  certain facilities, including the following:     Patient/family informed of bed offers received:  12/07/2014 Patient chooses bed at [facility] Physician recommends and patient chooses bed at    Patient to be transferred to  on   Patient to be transferred to facility by  Patient and family notified of transfer on  Name of family member notified:    The following physician request were entered in Epic:   Additional Comments:  Werner Lean LCSW [phone]

## 2014-12-07 NOTE — Progress Notes (Signed)
Physical Therapy Treatment Patient Details Name: BONNIE OVERDORF MRN: [id number] DOB: 1946-06-28 Today's Date: 12/07/2014    History of Present Illness R TKA    PT Comments    POD # 1 am session.  Applied KI and instructed on use for amb.  Assisted OOB to amb in hallway.  Assisted to recliner to perform TKR TE's followed by ICE. Tolerated session well.  Follow Up Recommendations  SNF     Equipment Recommendations  None recommended by PT    Recommendations for Other Services       Precautions / Restrictions Precautions Precautions: Knee;Fall Restrictions Weight Bearing Restrictions: No Other Position/Activity Restrictions: WBAT    Mobility  Bed Mobility Overal bed mobility: Needs Assistance Bed Mobility: Supine to Sit           General bed mobility comments: min A for RLE  Transfers Overall transfer level: Needs assistance Equipment used: Rolling walker (2 wheeled) Transfers: Sit to/from Stand Sit to Stand: Min guard;Min assist         General transfer comment: 25% VC's on proper tech and hand placement to push off  Ambulation/Gait Ambulation/Gait assistance: Supervision;Min guard Ambulation Distance (Feet): 45 Feet Assistive device: Rolling walker (2 wheeled) Gait Pattern/deviations: Step-to pattern;Decreased stance time - right Gait velocity: decreased   General Gait Details: 25% VC's on proper walker to self distance and safety with turns   Financial trader Rankin (Stroke Patients Only)       Balance                                    Cognition Arousal/Alertness: Awake/alert Behavior During Therapy: WFL for tasks assessed/performed Overall Cognitive Status: Within Functional Limits for tasks assessed                      Exercises   Total Knee Replacement TE's 10 reps B LE ankle pumps 10 reps towel squeezes 10 reps knee presses 10 reps heel slides  10 reps SAQ's 10 reps  SLR's 10 reps ABD Followed by ICE     General Comments        Pertinent Vitals/Pain Pain Assessment: 0-10 Pain Score: 5  Pain Location: R knee Pain Descriptors / Indicators: Sore;Tightness Pain Intervention(s): Monitored during session;Premedicated before session;Repositioned;Ice applied    Home Living                      Prior Function            PT Goals (current goals can now be found in the care plan section) Progress towards PT goals: Progressing toward goals    Frequency  7X/week    PT Plan      Co-evaluation             End of Session Equipment Utilized During Treatment: Gait belt;Right knee immobilizer Activity Tolerance: Patient tolerated treatment well Patient left: in chair;with call bell/phone within reach     Time: 1105-1135 PT Time Calculation (min) (ACUTE ONLY): 30 min  Charges:  $Gait Training: 8-22 mins $Therapeutic Exercise: 8-22 mins                    G Codes:      Rica Koyanagi  PTA [facility]  Acute  Rehab Pager  [phone]  

## 2014-12-07 NOTE — Progress Notes (Signed)
RT set patient up on CPAP. Patient setting is Auto Titrate 4-18 cmH2O. Sterile water added to water chamber for humidification.Oxygen is bleed into tubing. RT will continue to assess and monitor as needed.

## 2014-12-07 NOTE — Progress Notes (Signed)
Subjective: 1 Day Post-Op Procedure(s) (LRB): RIGHT TOTAL KNEE ARTHROPLASTY (Right) Patient reports pain as mild and moderate.   Patient seen in rounds with Dr. Wynelle Link.  Rough night last night. Patient is having problems with nausea/vomiting and pain in the knee, requiring pain medications We will start therapy today. Reduce back to full liquid until nausea improves.  Has IV phenergan ordered. Plan is to go Home after hospital stay.  Objective: Vital signs in last 24 hours: Temp:  [97.8 F (36.6 C)-99.4 F (37.4 C)] 98.2 F (36.8 C) (03/22 0616) Pulse Rate:  [66-107] 95 (03/22 0616) Resp:  [14-21] 16 (03/22 0616) BP: (110-133)/(51-81) 110/51 mmHg (03/22 0616) SpO2:  [81 %-100 %] 81 % (03/22 0820)  Intake/Output from previous day:  Intake/Output Summary (Last 24 hours) at 12/07/14 0837 Last data filed at 12/07/14 0617  Gross per 24 hour  Intake   2140 ml  Output   2251 ml  Net   -111 ml    Intake/Output this shift: UOP 650 since around [place]  Labs:  Recent Labs  12/07/14 0555  HGB 11.6*    Recent Labs  12/07/14 0555  WBC 6.3  RBC 4.50  HCT 37.9  PLT 229    Recent Labs  12/07/14 0555  NA 141  K 3.9  CL 107  CO2 24  BUN 10  CREATININE 0.57  GLUCOSE 144*  CALCIUM 8.0*   No results for input(s): LABPT, INR in the last 72 hours.  EXAM General - Patient is Alert, Appropriate and Oriented Extremity - Neurovascular intact Sensation intact distally Dorsiflexion/Plantar flexion intact Dressing - dressing C/D/I Motor Function - intact, moving foot and toes well on exam.  Hemovac pulled without difficulty.  Past Medical History  Diagnosis Date  . Hyperthyroidism   . Toxic goiter   . Reflux   . Colon polyps   . Migraines   . 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 Obesity, Class III, BMI 40-49.9 (morbid obesity) 04/27/2010    02-17-14 reports some weight loss- intentional  . Pelvic pain     02-17-14  "states thinks its back related on left groin"  . Anemia   . Clotting disorder 02-17-14    S/p appendectomy-developed Pulmonary emboli-tx. warfarin-d/c 1 yr ago.  . Osteoarthritis     in back(chronic pain)  . Pulmonary emboli     s/p Appendectomy '13 [facility]  . Diabetes mellitus without complication     "borderline"- oral med  . Right knee pain   . Sleep apnea     cpap - settings at 4   . Bronchitis     hx of   . Anxiety   . Depression   . Kidney stones   . Neuromuscular disorder     hands and throat"spasmodic dysphonia", tremors - thimb and forefinger   . Nausea Nov 15, 2014    cycle of migraine headaches and nausea    Assessment/Plan: 1 Day Post-Op Procedure(s) (LRB): RIGHT TOTAL KNEE ARTHROPLASTY (Right) Principal Problem:   OA (osteoarthritis) of knee  Estimated body mass index is 37.14 kg/(m^2) as calculated from the following:   Height as of this encounter: 5\' 6"  (1.676 m).   Weight as of this encounter: 104.327 kg (230 lb). Reduce diet to full liquids Up with therapy Discharge home with home health  DVT Prophylaxis - Xarelto Weight-Bearing as tolerated to right leg D/C O2 and Pulse OX and try on Room Air  Arlee Muslim, PA-C Orthopaedic Surgery 12/07/2014, 8:37 AM

## 2014-12-07 NOTE — Progress Notes (Signed)
Clinical Social Work Department BRIEF PSYCHOSOCIAL ASSESSMENT 12/07/2014  Patient:  Hayley [name], Hayley [name]     Account Number:  [id number]     Admit date:  12/06/2014  Clinical Social Worker:  Lacie Scotts  Date/Time:  12/07/2014 02:06 PM  Referred by:  Physician  Date Referred:  12/07/2014 Referred for  SNF Placement   Other Referral:   Interview type:  Patient Other interview type:    PSYCHOSOCIAL DATA Living Status:  HUSBAND Admitted from facility:   Level of care:   Primary support name:  Arnell Sieving Primary support relationship to patient:  SPOUSE Degree of support available:   limited support    CURRENT CONCERNS Current Concerns  Post-Acute Placement   Other Concerns:    SOCIAL WORK ASSESSMENT / PLAN Pt is a 69 yr old female living at home prior to hospitalization. CSW met with pt to assist with d/c planning. This is a planned admission.Pt has made prior arrangements to have ST Rehab at [facility] following hospital d/c. CSW has contacted SNF and d/c plans have been confirmed. CSW will continue to follow to assist with d/c planning to SNF.   Assessment/plan status:  Psychosocial Support/Ongoing Assessment of Needs Other assessment/ plan:   Information/referral to community resources:   Insurance coverage for SNF and ambulance transport reviewed.    PATIENT'S/FAMILY'S RESPONSE TO PLAN OF CARE: " I'm having a really hard time today. " Pt is looking forward to feeling better. " I know I need rehab when I leave here. "  Emotional Support provided.    Werner Lean LCSW [phone]

## 2014-12-07 NOTE — Progress Notes (Signed)
RT placed patient on CPAP. Setting is auto 4-18 cmH2O per patient home setting. Sterile water added to water chamber for humidification. 3 liters oxygen bleed into tubing. Patient is tolerating well. RT will continue to assess and monitor as needed.

## 2014-12-07 NOTE — Discharge Instructions (Addendum)
Dr. Gaynelle Arabian Total Joint Specialist [facility] [address] [place], [place] [address] [phone]  TOTAL KNEE REPLACEMENT POSTOPERATIVE DIRECTIONS     Knee Rehabilitation, Guidelines Following Surgery  Results after knee surgery are often greatly improved when you follow the exercise, range of motion and muscle strengthening exercises prescribed by your doctor. Safety measures are also important to protect the knee from further injury. Any time any of these exercises cause you to have increased pain or swelling in your knee joint, decrease the amount until you are comfortable again and slowly increase them. If you have problems or questions, call your caregiver or physical therapist for advice.   HOME CARE INSTRUCTIONS  Remove items at home which could result in a fall. This includes throw rugs or furniture in walking pathways.  Continue medications as instructed at time of discharge. You may have some home medications which will be placed on hold until you complete the course of blood thinner medication.  You may start showering once you are discharged home but do not submerge the incision under water. Just pat the incision dry and apply a dry gauze dressing on daily. Walk with walker as instructed.  You may resume a sexual relationship in one month or when given the OK by  your doctor.   Use walker as long as suggested by your caregivers.  Avoid periods of inactivity such as sitting longer than an hour when not asleep. This helps prevent blood clots.  You may put full weight on your legs and walk as much as is comfortable.  You may return to work once you are cleared by your doctor.  Do not drive a car for 6 weeks or until released by you surgeon.   Do not drive while taking narcotics.  Wear the elastic stockings for three weeks following surgery during the day but you may remove then at night. Make sure you keep all of your appointments after  your operation with all of your doctors and caregivers. You should call the office at the above phone number and make an appointment for approximately two weeks after the date of your surgery. Change the dressing daily and reapply a dry dressing each time. Please pick up a stool softener and laxative for home use as long as you are requiring pain medications.  ICE to the affected knee every three hours for 30 minutes at a time and then as needed for pain and swelling.  Continue to use ice on the knee for pain and swelling from surgery. You may notice swelling that will progress down to the foot and ankle.  This is normal after surgery.  Elevate the leg when you are not up walking on it.   It is important for you to complete the blood thinner medication as prescribed by your doctor.  Continue to use the breathing machine which will help keep your temperature down.  It is common for your temperature to cycle up and down following surgery, especially at night when you are not up moving around and exerting yourself.  The breathing machine keeps your lungs expanded and your temperature down.  RANGE OF MOTION AND STRENGTHENING EXERCISES  Rehabilitation of the knee is important following a knee injury or an operation. After just a few days of immobilization, the muscles of the thigh which control the knee become weakened and shrink (atrophy). Knee exercises are designed to build up the tone and strength of the thigh muscles and to improve  knee motion. Often times heat used for twenty to thirty minutes before working out will loosen up your tissues and help with improving the range of motion but do not use heat for the first two weeks following surgery. These exercises can be done on a training (exercise) mat, on the floor, on a table or on a bed. Use what ever works the best and is most comfortable for you Knee exercises include:  Leg Lifts - While your knee is still immobilized in a splint or cast, you can do  straight leg raises. Lift the leg to 60 degrees, hold for 3 sec, and slowly lower the leg. Repeat 10-20 times 2-3 times daily. Perform this exercise against resistance later as your knee gets better.  Quad and Hamstring Sets - Tighten up the muscle on the front of the thigh (Quad) and hold for 5-10 sec. Repeat this 10-20 times hourly. Hamstring sets are done by pushing the foot backward against an object and holding for 5-10 sec. Repeat as with quad sets.  A rehabilitation program following serious knee injuries can speed recovery and prevent re-injury in the future due to weakened muscles. Contact your doctor or a physical therapist for more information on knee rehabilitation.   SKILLED REHAB INSTRUCTIONS: If the patient is transferred to a skilled rehab facility following release from the hospital, a list of the current medications will be sent to the facility for the patient to continue.  When discharged from the skilled rehab facility, please have the facility set up the patient's [place] prior to being released. Also, the skilled facility will be responsible for providing the patient with their medications at time of release from the facility to include their pain medication, the muscle relaxants, and their blood thinner medication. If the patient is still at the rehab facility at time of the two week follow up appointment, the skilled rehab facility will also need to assist the patient in arranging follow up appointment in our office and any transportation needs.  PLEASE NOTE THAT BECAUSE OF POST-OPERATIVE PULMONARY EMBOLISM,  XARELTO WAS INCREASED FROM PROPHYLACTIC-DOSE TO FULL-DOSE. PATIENT SHOULD REMAIN ON XARELTO 15 MG PO TWICE A DAY WITH MEALS THROUGH 12/30/14, THEN ON 12/31/14 DOSAGE SHOULD CHANGE TO XARELTO 20 MG PO DAILY WITH FOOD.  PATIENT SHOULD REMAIN ON XARELTO FOR AT LEAST 6 MONTHS AND SHOULD FOLLOW-UP WITH HER PRIMARY CARE PHYSICIAN FOR MANAGEMENT OF THIS FOLLOWING  DISCHARGE FROM SKILLED NURSING FACILITY.  MAKE SURE YOU:  Understand these instructions.  Will watch your condition.  Will get help right away if you are not doing well or get worse.    Pick up stool softner and laxative for home use following surgery while on pain medications. Do not submerge incision under water. Please use good hand washing techniques while changing dressing each day. May shower starting three days after surgery. Please use a clean towel to pat the incision dry following showers. Continue to use ice for pain and swelling after surgery. Do not use any lotions or creams on the incision until instructed by your surgeon.    Postoperative Constipation Protocol  Constipation - defined medically as fewer than three stools per week and severe constipation as less than one stool per week.  One of the most common issues patients have following surgery is constipation.  Even if you have a regular bowel pattern at home, your normal regimen is likely to be disrupted due to multiple reasons following surgery.  Combination of anesthesia,  postoperative narcotics, change in appetite and fluid intake all can affect your bowels.  In order to avoid complications following surgery, here are some recommendations in order to help you during your recovery period.  Colace (docusate) - Pick up an over-the-counter form of Colace or another stool softener and take twice a day as long as you are requiring postoperative pain medications.  Take with a full glass of water daily.  If you experience loose stools or diarrhea, hold the colace until you stool forms back up.  If your symptoms do not get better within 1 week or if they get worse, check with your doctor.  Dulcolax (bisacodyl) - Pick up over-the-counter and take as directed by the product packaging as needed to assist with the movement of your bowels.  Take with a full glass of water.  Use this product as needed if not relieved by Colace only.     MiraLax (polyethylene glycol) - Pick up over-the-counter to have on hand.  MiraLax is a solution that will increase the amount of water in your bowels to assist with bowel movements.  Take as directed and can mix with a glass of water, juice, soda, coffee, or tea.  Take if you go more than two days without a movement. Do not use MiraLax more than once per day. Call your doctor if you are still constipated or irregular after using this medication for 7 days in a row.  If you continue to have problems with postoperative constipation, please contact the office for further assistance and recommendations.  If you experience "the worst abdominal pain ever" or develop nausea or vomiting, please contact the office immediatly for further recommendations for treatment.  ======================================================================================================================================================  Information on my medicine - XARELTO (rivaroxaban)  This medication education was reviewed with me or my healthcare representative as part of my discharge preparation.  The pharmacist that spoke with me during my hospital stay was:  Alverta Caccamo, Julieta Bellini, RPH  WHY WAS XARELTO PRESCRIBED FOR YOU? Xarelto was prescribed to treat blood clots that may have been found in the veins of your legs (deep vein thrombosis) or in your lungs (pulmonary embolism) and to reduce the risk of them occurring again.  What do you need to know about Xarelto? The starting dose is one 15 mg tablet taken TWICE daily with food for the FIRST 21 DAYS, then on 12/31/14 the dose is changed to one 20 mg tablet taken ONCE A DAY with your evening meal.  DO NOT stop taking Xarelto without talking to the health care provider who prescribed the medication.  Refill your prescription for 20 mg tablets before you run out.  After discharge, you should have regular check-up appointments with your healthcare provider that is  prescribing your Xarelto.  In the future your dose may need to be changed if your kidney function changes by a significant amount.  What do you do if you miss a dose? If you are taking Xarelto TWICE DAILY and you miss a dose, take it as soon as you remember. You may take two 15 mg tablets (total 30 mg) at the same time then resume your regularly scheduled 15 mg twice daily the next day.  If you are taking Xarelto ONCE DAILY and you miss a dose, take it as soon as you remember on the same day then continue your regularly scheduled once daily regimen the next day. Do not take two doses of Xarelto at the same time.   Important Safety Information Xarelto  is a blood thinner medicine that can cause bleeding. You should call your healthcare provider right away if you experience any of the following: ? Bleeding from an injury or your nose that does not stop. ? Unusual colored urine (red or dark brown) or unusual colored stools (red or black). ? Unusual bruising for unknown reasons. ? A serious fall or if you hit your head (even if there is no bleeding).  Some medicines may interact with Xarelto and might increase your risk of bleeding while on Xarelto. To help avoid this, consult your healthcare provider or pharmacist prior to using any new prescription or non-prescription medications, including herbals, vitamins, non-steroidal anti-inflammatory drugs (NSAIDs) and supplements.  This website has more information on Xarelto: [email or web address]

## 2014-12-08 ENCOUNTER — Inpatient Hospital Stay (HOSPITAL_COMMUNITY): Payer: Medicare Other

## 2014-12-08 LAB — CBC
HCT: 35.5 % — ABNORMAL LOW (ref 36.0–46.0)
Hemoglobin: 10.7 g/dL — ABNORMAL LOW (ref 12.0–15.0)
MCH: 25.2 pg — ABNORMAL LOW (ref 26.0–34.0)
MCHC: 30.1 g/dL (ref 30.0–36.0)
MCV: 83.7 fL (ref 78.0–100.0)
Platelets: 252 10*3/uL (ref 150–400)
RBC: 4.24 MIL/uL (ref 3.87–5.11)
RDW: 15.7 % — ABNORMAL HIGH (ref 11.5–15.5)
WBC: 7.2 10*3/uL (ref 4.0–10.5)

## 2014-12-08 LAB — GLUCOSE, CAPILLARY
Glucose-Capillary: 112 mg/dL — ABNORMAL HIGH (ref 70–99)
Glucose-Capillary: 91 mg/dL (ref 70–99)
Glucose-Capillary: 96 mg/dL (ref 70–99)
Glucose-Capillary: 89 mg/dL (ref 70–99)

## 2014-12-08 LAB — BASIC METABOLIC PANEL
Anion gap: 9 (ref 5–15)
BUN: 9 mg/dL (ref 6–23)
CO2: 27 mmol/L (ref 19–32)
Calcium: 8.3 mg/dL — ABNORMAL LOW (ref 8.4–10.5)
Chloride: 106 mmol/L (ref 96–112)
Creatinine, Ser: 0.54 mg/dL (ref 0.50–1.10)
GFR calc Af Amer: >90 mL/min (ref >90)
GFR calc non Af Amer: >90 mL/min (ref >90)
Glucose, Bld: 113 mg/dL — ABNORMAL HIGH (ref 70–99)
Potassium: 3.9 mmol/L (ref 3.5–5.1)
Sodium: 142 mmol/L (ref 135–145)

## 2014-12-08 LAB — CLOSTRIDIUM DIFFICILE BY PCR: Toxigenic C. Difficile by PCR: NEGATIVE

## 2014-12-08 MED ORDER — OXYCODONE HCL 5 MG PO TABS: 5.0000 mg | ORAL_TABLET | ORAL | Status: DC | PRN

## 2014-12-08 MED ORDER — LORAZEPAM 0.5 MG PO TABS
0.5000 mg | ORAL_TABLET | Freq: Three times a day (TID) | ORAL | Status: DC | PRN
  Administered 2014-12-12 – 2014-12-13 (×3): 0.5 mg via ORAL
  Filled 2014-12-08: qty 1
  Filled 2014-12-08: qty 2
  Filled 2014-12-08 (×4): qty 1

## 2014-12-08 MED ORDER — PROMETHAZINE HCL 25 MG/ML IJ SOLN
6.2500 mg | Freq: Four times a day (QID) | INTRAMUSCULAR | Status: DC | PRN
  Administered 2014-12-08 – 2014-12-09 (×3): 12.5 mg via INTRAVENOUS
  Filled 2014-12-08 (×3): qty 1

## 2014-12-08 MED ORDER — RIVAROXABAN 10 MG PO TABS: 10.0000 mg | ORAL_TABLET | Freq: Every day | ORAL | Status: DC

## 2014-12-08 MED ORDER — METHOCARBAMOL 500 MG PO TABS: 500.0000 mg | ORAL_TABLET | Freq: Four times a day (QID) | ORAL | Status: DC | PRN

## 2014-12-08 NOTE — Progress Notes (Signed)
Pt c/o Nausea, loose stools and abdominal cramping beginning after midnight. Reglan po ineffective as was po Phenergan. After 2 loose stools and Zofran  ODT   Nausea subsided. Pt stated she had been experiencing N/D x 3 weeks prior to hospitalization. Had sought the advice of her GI MD Collene Mares) who gave her a new med she could not recall at present. Will continue to monitor. Abdomen soft and non-tender.

## 2014-12-08 NOTE — Plan of Care (Signed)
Problem: Discharge Progression Outcomes Goal: Tolerates diet Outcome: Not Progressing Pt unable tolerate regular diet d/t N and V

## 2014-12-08 NOTE — Progress Notes (Signed)
RT placed pt on auto titrate CPAP set at 4-18cmH2O (pt believes her home pressure is 4cmH2O) via home ffm with 5Lpm of oxygen bled in. Pt states she is comfortable and is tolerating CPAP well at this time. RT will continue to monitor as needed.

## 2014-12-08 NOTE — Discharge Summary (Signed)
Physician Discharge Summary   Patient ID: DELPHA PERKO MRN: [id number] DOB/AGE: May 19, 1946 69 y.o.  Admit date: 12/06/2014 Discharge date: 12/13/2014  Primary Diagnosis: Primary osteoarthritis, right knee  Admission Diagnoses:  Past Medical History  Diagnosis Date  . Hyperthyroidism   . Toxic goiter   . Reflux   . Colon polyps   . Migraines   . 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 Obesity, Class III, BMI 40-49.9 (morbid obesity) 04/27/2010    02-17-14 reports some weight loss- intentional  . Pelvic pain     02-17-14 "states thinks its back related on left groin"  . Anemia   . Clotting disorder 02-17-14    S/p appendectomy-developed Pulmonary emboli-tx. warfarin-d/c 1 yr ago.  . Osteoarthritis     in back(chronic pain)  . Pulmonary emboli     s/p Appendectomy '13 [facility]  . Diabetes mellitus without complication     "borderline"- oral med  . Right knee pain   . Sleep apnea     cpap - settings at 4   . Bronchitis     hx of   . Anxiety   . Depression   . Kidney stones   . Neuromuscular disorder     hands and throat"spasmodic dysphonia", tremors - thimb and forefinger   . Nausea Nov 15, 2014    cycle of migraine headaches and nausea   Discharge Diagnoses:   Principal Problem:   OA (osteoarthritis) of knee  Estimated body mass index is 37.14 kg/(m^2) as calculated from the following:   Height as of this encounter: _0  (1.676 m).   Weight as of this encounter: 104.327 kg (230 lb).  Procedure:  Procedure(s) (LRB): RIGHT TOTAL KNEE ARTHROPLASTY (Right)   Consults: pulmonary/intensive care and internal medicine  HPI: Hayley [name], 69 y.o. female, has a history of pain and functional disability in the right knee due to arthritis and has failed non-surgical conservative treatments for greater than 12 weeks to includeNSAID's and/or analgesics, corticosteriod injections, viscosupplementation injections and activity  modification. Onset of symptoms was gradual, starting 8 years ago with gradually worsening course since that time. The patient noted prior procedures on the knee to include arthroscopy and menisectomy on the right knee(s). Patient currently rates pain in the right knee(s) at 9 out of 10 with activity. Patient has night pain, worsening of pain with activity and weight bearing, pain that interferes with activities of daily living, pain with passive range of motion, crepitus and joint swelling. Patient has evidence of periarticular osteophytes and joint space narrowing by imaging studies. There is no active infection.  Laboratory Data: Admission on 12/06/2014  Component Date Value Ref Range Status  . Glucose-Capillary 12/06/2014 142* 70 - 99 mg/dL Final  . Glucose-Capillary 12/06/2014 123* 70 - 99 mg/dL Final  . Glucose-Capillary 12/06/2014 204* 70 - 99 mg/dL Final  . WBC 12/07/2014 6.3  4.0 - 10.5 K/uL Final  . RBC 12/07/2014 4.50  3.87 - 5.11 MIL/uL Final  . Hemoglobin 12/07/2014 11.6* 12.0 - 15.0 g/dL Final  . HCT 12/07/2014 37.9  36.0 - 46.0 % Final  . MCV 12/07/2014 84.2  78.0 - 100.0 fL Final  . MCH 12/07/2014 25.8* 26.0 - 34.0 pg Final  . MCHC 12/07/2014 30.6  30.0 - 36.0 g/dL Final  . RDW 12/07/2014 15.7* 11.5 - 15.5 % Final  . Platelets 12/07/2014 229  150 - 400 K/uL Final  . Sodium 12/07/2014 141  135 - 145 mmol/L Final  . Potassium 12/07/2014 3.9  3.5 - 5.1 mmol/L Final  . Chloride 12/07/2014 107  96 - 112 mmol/L Final  . CO2 12/07/2014 24  19 - 32 mmol/L Final  . Glucose, Bld 12/07/2014 144* 70 - 99 mg/dL Final  . BUN 12/07/2014 10  6 - 23 mg/dL Final  . Creatinine, Ser 12/07/2014 0.57  0.50 - 1.10 mg/dL Final  . Calcium 12/07/2014 8.0* 8.4 - 10.5 mg/dL Final  . GFR calc non Af Amer 12/07/2014 >90  >90 mL/min Final  . GFR calc Af Amer 12/07/2014 >90  >90 mL/min Final   Comment: (NOTE) The eGFR has been calculated using the CKD EPI equation. This calculation has not been  validated in all clinical situations. eGFR's persistently <90 mL/min signify possible Chronic Kidney Disease.   . Anion gap 12/07/2014 10  5 - 15 Final  . Glucose-Capillary 12/06/2014 144* 70 - 99 mg/dL Final  . Glucose-Capillary 12/07/2014 145* 70 - 99 mg/dL Final  . Glucose-Capillary 12/07/2014 142* 70 - 99 mg/dL Final  . WBC 12/08/2014 7.2  4.0 - 10.5 K/uL Final  . RBC 12/08/2014 4.24  3.87 - 5.11 MIL/uL Final  . Hemoglobin 12/08/2014 10.7* 12.0 - 15.0 g/dL Final  . HCT 12/08/2014 35.5* 36.0 - 46.0 % Final  . MCV 12/08/2014 83.7  78.0 - 100.0 fL Final  . MCH 12/08/2014 25.2* 26.0 - 34.0 pg Final  . MCHC 12/08/2014 30.1  30.0 - 36.0 g/dL Final  . RDW 12/08/2014 15.7* 11.5 - 15.5 % Final  . Platelets 12/08/2014 252  150 - 400 K/uL Final  . Sodium 12/08/2014 142  135 - 145 mmol/L Final  . Potassium 12/08/2014 3.9  3.5 - 5.1 mmol/L Final   Comment: SLIGHT HEMOLYSIS HEMOLYSIS AT THIS LEVEL MAY AFFECT RESULT   . Chloride 12/08/2014 106  96 - 112 mmol/L Final  . CO2 12/08/2014 27  19 - 32 mmol/L Final  . Glucose, Bld 12/08/2014 113* 70 - 99 mg/dL Final  . BUN 12/08/2014 9  6 - 23 mg/dL Final  . Creatinine, Ser 12/08/2014 0.54  0.50 - 1.10 mg/dL Final  . Calcium 12/08/2014 8.3* 8.4 - 10.5 mg/dL Final  . GFR calc non Af Amer 12/08/2014 >90  >90 mL/min Final  . GFR calc Af Amer 12/08/2014 >90  >90 mL/min Final   Comment: (NOTE) The eGFR has been calculated using the CKD EPI equation. This calculation has not been validated in all clinical situations. eGFR's persistently <90 mL/min signify possible Chronic Kidney Disease.   . Anion gap 12/08/2014 9  5 - 15 Final  . Glucose-Capillary 12/07/2014 179* 70 - 99 mg/dL Final  . Glucose-Capillary 12/07/2014 144* 70 - 99 mg/dL Final  Hospital Outpatient Visit on 11/30/2014  Component Date Value Ref Range Status  . aPTT 11/30/2014 33  24 - 37 seconds Final  . Prothrombin Time 11/30/2014 13.1  11.6 - 15.2 seconds Final  . INR 11/30/2014 0.98   0.00 - 1.49 Final  . ABO/RH(D) 11/30/2014 O POS   Final  . Antibody Screen 11/30/2014 NEG   Final  . Sample Expiration 11/30/2014 12/09/2014   Final  . Color, Urine 11/30/2014 YELLOW  YELLOW Final  . APPearance 11/30/2014 CLEAR  CLEAR Final  . Specific Gravity, Urine 11/30/2014 1.019  1.005 - 1.030 Final  . pH 11/30/2014 7.5  5.0 - 8.0 Final  . Glucose, UA 11/30/2014 NEGATIVE  NEGATIVE mg/dL Final  . Hgb urine dipstick 11/30/2014 NEGATIVE  NEGATIVE Final  . Bilirubin Urine 11/30/2014 NEGATIVE  NEGATIVE Final  . Ketones, ur 11/30/2014 NEGATIVE  NEGATIVE mg/dL Final  . Protein, ur 11/30/2014 NEGATIVE  NEGATIVE mg/dL Final  . Urobilinogen, UA 11/30/2014 1.0  0.0 - 1.0 mg/dL Final  . Nitrite 11/30/2014 NEGATIVE  NEGATIVE Final  . Leukocytes, UA 11/30/2014 SMALL* NEGATIVE Final  . MRSA, PCR 11/30/2014 NEGATIVE  NEGATIVE Final  . Staphylococcus aureus 11/30/2014 NEGATIVE  NEGATIVE Final   Comment:        The Xpert SA Assay (FDA approved for NASAL specimens in patients over 9 years of age), is one component of a comprehensive surveillance program.  Test performance has been validated by [facility] for patients greater than or equal to 106 year old. It is not intended to diagnose infection nor to guide or monitor treatment.   . Squamous Epithelial / LPF 11/30/2014 RARE  RARE Final  . WBC, UA 11/30/2014 0-2  <3 WBC/hpf Final  . Bacteria, UA 11/30/2014 RARE  RARE Final      EKG: Orders placed or performed during the hospital encounter of 05/15/14  . ED EKG  . ED EKG  . EKG     Hospital Course: LADY WISHAM is a 69 y.o. who was admitted to [facility]. They were brought to the operating room on 12/06/2014 and underwent Procedure(s): RIGHT TOTAL KNEE ARTHROPLASTY.  Patient tolerated the procedure well and was later transferred to the recovery room and then to the orthopaedic floor for postoperative care.  They were given PO and IV analgesics for pain control following their  surgery.  They were given 24 hours of postoperative antibiotics of  Anti-infectives    Start     Dose/Rate Route Frequency Ordered Stop   12/06/14 1500  ceFAZolin (ANCEF) IVPB 2 g/50 mL premix     2 g 100 mL/hr over 30 Minutes Intravenous Every 6 hours 12/06/14 1210 12/06/14 2054   12/06/14 1210  valACYclovir (VALTREX) tablet 2,000 mg     2,000 mg Oral 2 times daily PRN 12/06/14 1210     12/06/14 0646  ceFAZolin (ANCEF) IVPB 2 g/50 mL premix     2 g 100 mL/hr over 30 Minutes Intravenous On call to O.R. 12/06/14 1610 12/06/14 0929     and started on DVT prophylaxis in the form of Xarelto.   PT and OT were ordered for total joint protocol.  Discharge planning consulted to help with postop disposition and equipment needs.  Patient had a difficult night on the evening of surgery due to nausea and vomiting.  They started to get up OOB with therapy on day one. Hemovac drain was pulled without difficulty.  Continued to work with therapy into day two. Patient continued to progress slowly due to abdominal issues and SOB. KUB normal. Dressing was changed on day two and the incision was clean and dry.  Incision was healing well. Unfortunately, respiration did not improve. Spiral CT revealed PE, Pulmonology consulted. She did however continue to require 2L O2 to maintain adequate O2 sats. Patient started to improve and discharge to [facility] was prepared.    Diet: Cardiac diet Activity:WBAT Follow-up:in 2 weeks from surgery date Disposition - Skilled nursing facility Discharged Condition: stable   Discharge Instructions    Call MD / Call 911    Complete by:  As directed   If you experience chest pain or shortness of breath, CALL 911 and be transported to the hospital emergency room.  If you develope  a fever above 101 F, pus (white drainage) or increased drainage or redness at the wound, or calf pain, call your surgeon's office.     Change dressing    Complete by:  As directed   Change dressing  daily with sterile 4 x 4 inch gauze dressing and apply TED hose. Do not submerge the incision under water.     Constipation Prevention    Complete by:  As directed   Drink plenty of fluids.  Prune juice may be helpful.  You may use a stool softener, such as Colace (over the counter) 100 mg twice a day.  Use MiraLax (over the counter) for constipation as needed.     Diet - low sodium heart healthy    Complete by:  As directed      Diet Carb Modified    Complete by:  As directed      Discharge instructions    Complete by:  As directed   Pick up stool softner and laxative for home use following surgery while on pain medications. Do not submerge incision under water. Please use good hand washing techniques while changing dressing each day. May shower starting three days after surgery. Please use a clean towel to pat the incision dry following showers. Continue to use ice for pain and swelling after surgery. Do not use any lotions or creams on the incision until instructed by your surgeon.  Take Xarelto for two and a half more weeks, then discontinue Xarelto. Once the patient has completed the blood thinner regimen, then take a Baby 81 mg Aspirin daily for three more weeks.  Postoperative Constipation Protocol  Constipation - defined medically as fewer than three stools per week and severe constipation as less than one stool per week.  One of the most common issues patients have following surgery is constipation.  Even if you have a regular bowel pattern at home, your normal regimen is likely to be disrupted due to multiple reasons following surgery.  Combination of anesthesia, postoperative narcotics, change in appetite and fluid intake all can affect your bowels.  In order to avoid complications following surgery, here are some recommendations in order to help you during your recovery period.  Colace (docusate) - Pick up an over-the-counter form of Colace or another stool softener and take  twice a day as long as you are requiring postoperative pain medications.  Take with a full glass of water daily.  If you experience loose stools or diarrhea, hold the colace until you stool forms back up.  If your symptoms do not get better within 1 week or if they get worse, check with your doctor.  Dulcolax (bisacodyl) - Pick up over-the-counter and take as directed by the product packaging as needed to assist with the movement of your bowels.  Take with a full glass of water.  Use this product as needed if not relieved by Colace only.   MiraLax (polyethylene glycol) - Pick up over-the-counter to have on hand.  MiraLax is a solution that will increase the amount of water in your bowels to assist with bowel movements.  Take as directed and can mix with a glass of water, juice, soda, coffee, or tea.  Take if you go more than two days without a movement. Do not use MiraLax more than once per day. Call your doctor if you are still constipated or irregular after using this medication for 7 days in a row.  If you continue to have problems with postoperative  constipation, please contact the office for further assistance and recommendations.  If you experience "the worst abdominal pain ever" or develop nausea or vomiting, please contact the office immediatly for further recommendations for treatment.     Do not put a pillow under the knee. Place it under the heel.    Complete by:  As directed      Do not sit on low chairs, stoools or toilet seats, as it may be difficult to get up from low surfaces    Complete by:  As directed      Driving restrictions    Complete by:  As directed   No driving until released by the physician.     Increase activity slowly as tolerated    Complete by:  As directed      Lifting restrictions    Complete by:  As directed   No lifting until released by the physician.     Patient may shower    Complete by:  As directed   You may shower without a dressing once there is no  drainage.  Do not wash over the wound.  If drainage remains, do not shower until drainage stops.     TED hose    Complete by:  As directed   Use stockings (TED hose) for 3 weeks on both leg(s).  You may remove them at night for sleeping.     Weight bearing as tolerated    Complete by:  As directed   Laterality:  right  Extremity:  Lower       Continue nasal canula O2 2L until patient can demonstrate ability to maintain O2 sats on room air     Medication List    STOP taking these medications        cholecalciferol 1000 UNITS tablet  Commonly known as:  VITAMIN D     diclofenac 1.3 % Ptch  Commonly known as:  FLECTOR     diclofenac sodium 1 % Gel  Commonly known as:  VOLTAREN     HYDROcodone-acetaminophen 5-325 MG per tablet  Commonly known as:  NORCO/VICODIN     ketorolac 10 MG tablet  Commonly known as:  TORADOL     oxyCODONE-acetaminophen 10-325 MG per tablet  Commonly known as:  PERCOCET     PROBIOTIC DAILY PO      TAKE these medications        albuterol 108 (90 BASE) MCG/ACT inhaler  Commonly known as:  PROVENTIL HFA;VENTOLIN HFA  Inhale 1 puff into the lungs every 6 (six) hours as needed for wheezing or shortness of breath.     albuterol (2.5 MG/3ML) 0.083% nebulizer solution  Commonly known as:  PROVENTIL  Take 2.5 mg by nebulization every 6 (six) hours as needed for wheezing or shortness of breath.     AZO TABS PO  Take 1 tablet by mouth daily as needed (UTI).     benzonatate 100 MG capsule  Commonly known as:  TESSALON  Take 100 mg by mouth 3 (three) times daily as needed for cough.     clonazePAM 0.5 MG tablet  Commonly known as:  KLONOPIN  Take 0.5 mg by mouth at bedtime.     cyclobenzaprine 10 MG tablet  Commonly known as:  FLEXERIL  Take 10 mg by mouth at bedtime.     EPIPEN 0.3 mg/0.3 mL Devi  Generic drug:  EPINEPHrine  Inject 0.3 mg into the muscle once as needed (Allergic reaction).     fluorometholone  0.1 % ophthalmic suspension    Commonly known as:  FML  Place 1 drop into both eyes daily as needed (Eye allergies).     fluticasone 50 MCG/ACT nasal spray  Commonly known as:  FLONASE  Place 1 spray into both nostrils daily as needed for allergies or rhinitis.     gabapentin 800 MG tablet  Commonly known as:  NEURONTIN  Take 800-1,600 mg by mouth 2 (two) times daily. 800 in the morning and 1600 at pm     levothyroxine 25 MCG tablet  Commonly known as:  SYNTHROID, LEVOTHROID  Take 25 mcg by mouth every morning.     methocarbamol 500 MG tablet  Commonly known as:  ROBAXIN  Take 1 tablet (500 mg total) by mouth every 6 (six) hours as needed for muscle spasms.     mometasone-formoterol 100-5 MCG/ACT Aero  Commonly known as:  DULERA  Inhale 2 puffs into the lungs 2 (two) times daily.     nystatin 100000 UNIT/ML suspension  Commonly known as:  MYCOSTATIN  Take 5 mLs by mouth daily as needed (Yeast).     ondansetron 4 MG tablet  Commonly known as:  ZOFRAN  Take 4 mg by mouth every 8 (eight) hours as needed for nausea or vomiting.     ondansetron 8 MG disintegrating tablet  Commonly known as:  ZOFRAN-ODT  Take 8 mg by mouth every 8 (eight) hours as needed for nausea or vomiting.     oxyCODONE 5 MG immediate release tablet  Commonly known as:  Oxy IR/ROXICODONE  Take 1-2 tablets (5-10 mg total) by mouth every 3 (three) hours as needed for moderate pain or severe pain.     pantoprazole 40 MG tablet  Commonly known as:  PROTONIX  Take 40 mg by mouth daily.     primidone 50 MG tablet  Commonly known as:  MYSOLINE  Take 50 mg by mouth at bedtime.     promethazine 25 MG tablet  Commonly known as:  PHENERGAN  Take 25 mg by mouth every 6 (six) hours as needed for nausea or vomiting.     rivaroxaban 15 MG Tabs tablet  Commonly known as:  XARELTO  - Take 15 mg by mouth daily twice daily     rizatriptan 10 MG tablet  Commonly known as:  MAXALT  Take 10 mg by mouth as needed for migraine (migraine). May  repeat in 2 hours if needed     sertraline 100 MG tablet  Commonly known as:  ZOLOFT  Take 100 mg by mouth daily.     sitaGLIPtin 100 MG tablet  Commonly known as:  JANUVIA  Take 100 mg by mouth every evening.     SUMAtriptan 6 MG/0.5ML Soln injection  Commonly known as:  IMITREX  Inject 6 mg into the skin every 2 (two) hours as needed for migraine or headache. May repeat in 2 hours if headache persists or recurs.     valACYclovir 1000 MG tablet  Commonly known as:  VALTREX  Take 2,000 mg by mouth 2 (two) times daily as needed (Fever blisters).           Follow-up Information    Follow up with Gearlean Alf, MD. Schedule an appointment as soon as possible for a visit in 2 weeks.   Specialty:  Orthopedic Surgery   Why:  Call office at [phone] to setup appointment with Dr. Wynelle Link in two weeks.   Contact information:   [address]  [place] [address] [phone]       Signed: Ardeen Jourdain, PA-C Orthopaedic Surgery 12/08/2014, 6:43 AM

## 2014-12-08 NOTE — Progress Notes (Signed)
PT Cancellation Note  Patient Details Name: Hayley [name] MRN: [id number] DOB: 13-Oct-1945   Cancelled Treatment:     pt having a very bad day of nausea/vomiting and loose stools.  Has been up to [facility] several times today with nursing staff.  Unable to tolerate therapy.    Nathanial Rancher 12/08/2014, 4:05 PM

## 2014-12-08 NOTE — Progress Notes (Signed)
   Subjective: 2 Days Post-Op Procedure(s) (LRB): RIGHT TOTAL KNEE ARTHROPLASTY (Right) Patient reports pain as moderate.   Patient seen in rounds with Dr. Wynelle Link. Patient is having problems with diarrhea/loose stools and nausea/vomiting Plan is to go Home after hospital stay. Will get KUB this morning.  Objective: Vital signs in last 24 hours: Temp:  [98.2 F (36.8 C)-99.6 F (37.6 C)] 98.2 F (36.8 C) (03/23 0631) Pulse Rate:  [99-106] 106 (03/23 0631) Resp:  [15-16] 15 (03/23 0631) BP: (125-138)/(70-80) 125/72 mmHg (03/23 0631) SpO2:  [85 %-94 %] 92 % (03/23 0631)  Intake/Output from previous day:  Intake/Output Summary (Last 24 hours) at 12/08/14 0911 Last data filed at 12/08/14 0600  Gross per 24 hour  Intake 2682.08 ml  Output    850 ml  Net 1832.08 ml    Intake/Output this shift:    Labs:  Recent Labs  12/07/14 0555 12/08/14 0500  HGB 11.6* 10.7*    Recent Labs  12/07/14 0555 12/08/14 0500  WBC 6.3 7.2  RBC 4.50 4.24  HCT 37.9 35.5*  PLT 229 252    Recent Labs  12/07/14 0555 12/08/14 0500  NA 141 142  K 3.9 3.9  CL 107 106  CO2 24 27  BUN 10 9  CREATININE 0.57 0.54  GLUCOSE 144* 113*  CALCIUM 8.0* 8.3*   No results for input(s): LABPT, INR in the last 72 hours.  EXAM General - Patient is Alert, Appropriate and Oriented Extremity - Neurovascular intact Sensation intact distally Dorsiflexion/Plantar flexion intact Dressing/Incision - clean, dry, no drainage, healing Motor Function - intact, moving foot and toes well on exam.  Abdomen is slightly distended but soft  Past Medical History  Diagnosis Date  . Hyperthyroidism   . Toxic goiter   . Reflux   . Colon polyps   . Migraines   . 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 Obesity, Class III, BMI 40-49.9 (morbid obesity) 04/27/2010    02-17-14 reports some weight loss- intentional  . Pelvic pain     02-17-14 "states thinks its back  related on left groin"  . Anemia   . Clotting disorder 02-17-14    S/p appendectomy-developed Pulmonary emboli-tx. warfarin-d/c 1 yr ago.  . Osteoarthritis     in back(chronic pain)  . Pulmonary emboli     s/p Appendectomy '13 [facility]  . Diabetes mellitus without complication     "borderline"- oral med  . Right knee pain   . Sleep apnea     cpap - settings at 4   . Bronchitis     hx of   . Anxiety   . Depression   . Kidney stones   . Neuromuscular disorder     hands and throat"spasmodic dysphonia", tremors - thimb and forefinger   . Nausea Nov 15, 2014    cycle of migraine headaches and nausea    Assessment/Plan: 2 Days Post-Op Procedure(s) (LRB): RIGHT TOTAL KNEE ARTHROPLASTY (Right) Principal Problem:   OA (osteoarthritis) of knee  Estimated body mass index is 37.14 kg/(m^2) as calculated from the following:   Height as of this encounter: 5\' 6"  (1.676 m).   Weight as of this encounter: 104.327 kg (230 lb). Up with therapy Plan for discharge tomorrow  KUB today  DVT Prophylaxis - Xarelto Weight-Bearing as tolerated to right leg  Arlee Muslim, PA-C Orthopaedic Surgery 12/08/2014, 9:11 AM

## 2014-12-09 ENCOUNTER — Inpatient Hospital Stay (HOSPITAL_COMMUNITY): Payer: Medicare Other

## 2014-12-09 ENCOUNTER — Encounter (HOSPITAL_COMMUNITY): Payer: Self-pay | Admitting: Radiology

## 2014-12-09 DIAGNOSIS — F341 Dysthymic disorder: Secondary | ICD-10-CM

## 2014-12-09 DIAGNOSIS — J9601 Acute respiratory failure with hypoxia: Secondary | ICD-10-CM | POA: Diagnosis present

## 2014-12-09 DIAGNOSIS — R197 Diarrhea, unspecified: Secondary | ICD-10-CM | POA: Diagnosis present

## 2014-12-09 DIAGNOSIS — Z86711 Personal history of pulmonary embolism: Secondary | ICD-10-CM

## 2014-12-09 LAB — CBC
HCT: 36.2 % (ref 36.0–46.0)
Hemoglobin: 10.8 g/dL — ABNORMAL LOW (ref 12.0–15.0)
MCH: 25.1 pg — ABNORMAL LOW (ref 26.0–34.0)
MCHC: 29.8 g/dL — ABNORMAL LOW (ref 30.0–36.0)
MCV: 84.2 fL (ref 78.0–100.0)
Platelets: 244 K/uL (ref 150–400)
RBC: 4.3 MIL/uL (ref 3.87–5.11)
RDW: 15.9 % — ABNORMAL HIGH (ref 11.5–15.5)
WBC: 6.6 K/uL (ref 4.0–10.5)

## 2014-12-09 LAB — GLUCOSE, CAPILLARY
Glucose-Capillary: 86 mg/dL (ref 70–99)
Glucose-Capillary: 89 mg/dL (ref 70–99)
Glucose-Capillary: 95 mg/dL (ref 70–99)
Glucose-Capillary: 100 mg/dL — ABNORMAL HIGH (ref 70–99)

## 2014-12-09 LAB — BASIC METABOLIC PANEL
Anion gap: 9 (ref 5–15)
BUN: 8 mg/dL (ref 6–23)
CO2: 29 mmol/L (ref 19–32)
Calcium: 8.5 mg/dL (ref 8.4–10.5)
Chloride: 102 mmol/L (ref 96–112)
Creatinine, Ser: 0.56 mg/dL (ref 0.50–1.10)
GFR calc Af Amer: >90 mL/min (ref >90)
GFR calc non Af Amer: >90 mL/min (ref >90)
Glucose, Bld: 103 mg/dL — ABNORMAL HIGH (ref 70–99)
Potassium: 3.3 mmol/L — ABNORMAL LOW (ref 3.5–5.1)
Sodium: 140 mmol/L (ref 135–145)

## 2014-12-09 LAB — BRAIN NATRIURETIC PEPTIDE: B Natriuretic Peptide: 46.7 pg/mL (ref 0.0–100.0)

## 2014-12-09 LAB — TROPONIN I: Troponin I: <0.03 ng/mL (ref <0.031)

## 2014-12-09 LAB — PROCALCITONIN: Procalcitonin: <0.1 ng/mL

## 2014-12-09 LAB — TSH: TSH: 1.305 u[IU]/mL (ref 0.350–4.500)

## 2014-12-09 LAB — LACTIC ACID, PLASMA: Lactic Acid, Venous: 1.1 mmol/L (ref 0.5–2.0)

## 2014-12-09 MED ORDER — IOHEXOL 350 MG/ML SOLN
100.0000 mL | Freq: Once | INTRAVENOUS | Status: AC | PRN
  Administered 2014-12-09: 100 mL via INTRAVENOUS

## 2014-12-09 MED ORDER — FUROSEMIDE 10 MG/ML IJ SOLN
20.0000 mg | Freq: Once | INTRAMUSCULAR | Status: AC
  Administered 2014-12-09: 20 mg via INTRAVENOUS
  Filled 2014-12-09: qty 2

## 2014-12-09 MED ORDER — POTASSIUM CHLORIDE CRYS ER 20 MEQ PO TBCR
40.0000 meq | EXTENDED_RELEASE_TABLET | Freq: Two times a day (BID) | ORAL | Status: AC
  Administered 2014-12-09 – 2014-12-10 (×3): 40 meq via ORAL
  Filled 2014-12-09 (×3): qty 2

## 2014-12-09 MED ORDER — ONDANSETRON HCL 4 MG/2ML IJ SOLN
4.0000 mg | Freq: Four times a day (QID) | INTRAMUSCULAR | Status: AC
  Administered 2014-12-09: 4 mg via INTRAVENOUS
  Filled 2014-12-09: qty 2

## 2014-12-09 MED ORDER — SACCHAROMYCES BOULARDII 250 MG PO CAPS
250.0000 mg | ORAL_CAPSULE | Freq: Two times a day (BID) | ORAL | Status: DC
  Administered 2014-12-09 – 2014-12-13 (×8): 250 mg via ORAL
  Filled 2014-12-09 (×9): qty 1

## 2014-12-09 MED ORDER — IPRATROPIUM-ALBUTEROL 0.5-2.5 (3) MG/3ML IN SOLN
3.0000 mL | Freq: Three times a day (TID) | RESPIRATORY_TRACT | Status: DC
  Administered 2014-12-10 – 2014-12-13 (×9): 3 mL via RESPIRATORY_TRACT
  Filled 2014-12-09 (×10): qty 3

## 2014-12-09 MED ORDER — INSULIN ASPART 100 UNIT/ML ~~LOC~~ SOLN: 0.0000 [IU] | Freq: Every day | SUBCUTANEOUS | Status: DC

## 2014-12-09 MED ORDER — INSULIN ASPART 100 UNIT/ML ~~LOC~~ SOLN
0.0000 [IU] | Freq: Three times a day (TID) | SUBCUTANEOUS | Status: DC
  Administered 2014-12-12 – 2014-12-13 (×3): 2 [IU] via SUBCUTANEOUS

## 2014-12-09 MED ORDER — HYDROMORPHONE HCL 1 MG/ML IJ SOLN: 0.5000 mg | INTRAMUSCULAR | Status: DC | PRN

## 2014-12-09 MED ORDER — LOPERAMIDE HCL 2 MG PO CAPS
2.0000 mg | ORAL_CAPSULE | Freq: Once | ORAL | Status: AC
  Administered 2014-12-09: 2 mg via ORAL
  Filled 2014-12-09: qty 1

## 2014-12-09 MED ORDER — IOHEXOL 300 MG/ML  SOLN: 50.0000 mL | Freq: Once | INTRAMUSCULAR | Status: DC | PRN

## 2014-12-09 MED ORDER — OXYCODONE HCL 5 MG PO TABS: 5.0000 mg | ORAL_TABLET | ORAL | Status: DC | PRN

## 2014-12-09 MED ORDER — IPRATROPIUM-ALBUTEROL 0.5-2.5 (3) MG/3ML IN SOLN
3.0000 mL | Freq: Four times a day (QID) | RESPIRATORY_TRACT | Status: DC
  Administered 2014-12-09: 3 mL via RESPIRATORY_TRACT
  Filled 2014-12-09: qty 3

## 2014-12-09 NOTE — Progress Notes (Signed)
Subjective: 3 Days Post-Op Procedure(s) (LRB): RIGHT TOTAL KNEE ARTHROPLASTY (Right) Patient reports pain as mild.   Patient seen in rounds with Dr. Wynelle Link.  Still has some intermittent nausea and continues with diarrhea.  Her WBC has been normal.  It is of note that she has seen a GI physician prior to surgery and had already developed some nausea even preop prior to admission.  Will recheck her Lytes this morning due to the diarrhea. Patient is having problems with diarrhea/loose stools, nausea/vomiting and pain in the knee, requiring pain medications Plan is to go Home after hospital stay. KUB was noted to be okay yesterday. EXAM: ABDOMEN - 1 VIEW IMPRESSION: 1. No acute findings. No evidence of bowel obstruction or generalized adynamic ileus.  Objective: Vital signs in last 24 hours: Temp:  [98.6 F (37 C)-99.6 F (37.6 C)] 99.2 F (37.3 C) (03/24 0541) Pulse Rate:  [105-111] 105 (03/24 0541) Resp:  [14-16] 16 (03/24 0541) BP: (100-129)/(68-73) 100/70 mmHg (03/24 0541) SpO2:  [79 %-93 %] 91 % (03/24 0803)  Intake/Output from previous day:  Intake/Output Summary (Last 24 hours) at 12/09/14 0823 Last data filed at 12/09/14 0600  Gross per 24 hour  Intake 649.17 ml  Output      0 ml  Net 649.17 ml    Intake/Output this shift:    Labs:  Recent Labs  12/07/14 0555 12/08/14 0500 12/09/14 0508  HGB 11.6* 10.7* 10.8*    Recent Labs  12/08/14 0500 12/09/14 0508  WBC 7.2 6.6  RBC 4.24 4.30  HCT 35.5* 36.2  PLT 252 244    Recent Labs  12/07/14 0555 12/08/14 0500  NA 141 142  K 3.9 3.9  CL 107 106  CO2 24 27  BUN 10 9  CREATININE 0.57 0.54  GLUCOSE 144* 113*  CALCIUM 8.0* 8.3*   No results for input(s): LABPT, INR in the last 72 hours.  EXAM General - Patient is Alert, Appropriate and Oriented Extremity - Neurovascular intact Sensation intact distally Dorsiflexion/Plantar flexion intact Dressing/Incision - clean, dry, no drainage,  healing Motor Function - intact, moving foot and toes well on exam.   Past Medical History  Diagnosis Date  . Hyperthyroidism   . Toxic goiter   . Reflux   . Colon polyps   . Migraines   . 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 Obesity, Class III, BMI 40-49.9 (morbid obesity) 04/27/2010    02-17-14 reports some weight loss- intentional  . Pelvic pain     02-17-14 "states thinks its back related on left groin"  . Anemia   . Clotting disorder 02-17-14    S/p appendectomy-developed Pulmonary emboli-tx. warfarin-d/c 1 yr ago.  . Osteoarthritis     in back(chronic pain)  . Pulmonary emboli     s/p Appendectomy '13 [facility]  . Diabetes mellitus without complication     "borderline"- oral med  . Right knee pain   . Sleep apnea     cpap - settings at 4   . Bronchitis     hx of   . Anxiety   . Depression   . Kidney stones   . Neuromuscular disorder     hands and throat"spasmodic dysphonia", tremors - thimb and forefinger   . Nausea Nov 15, 2014    cycle of migraine headaches and nausea    Assessment/Plan: 3 Days Post-Op Procedure(s) (LRB): RIGHT TOTAL KNEE ARTHROPLASTY (Right) Principal Problem:   OA (  osteoarthritis) of knee  Estimated body mass index is 37.14 kg/(m^2) as calculated from the following:   Height as of this encounter: 5\' 6"  (1.676 m).   Weight as of this encounter: 104.327 kg (230 lb). Advance diet as tolerated. Up with therapy Plan for discharge tomorrow if improves  DVT Prophylaxis - Xarelto Weight-Bearing as tolerated to right leg  Arlee Muslim, PA-C Orthopaedic Surgery 12/09/2014, 8:23 AM

## 2014-12-09 NOTE — Consult Note (Addendum)
Medical Consultation   Hayley [name]  FBP:[id number]  DOB: September 29, 1945  DOA: 12/06/2014  PCP: Tommy Medal, MD  Requesting physician: Dr Wynelle Link  Reason for consultation: Hypoxia with nausea vomiting and diarrhea   History of Present Illness: Patient is a 69 year old female with thyroid disorder, GERD, anxiety/depression, morbid obesity, vocal cord disorder, prior history of pulmonary embolism in 2013, intermittent nausea with chronic diarrhea has been admitted for knee arthritis status post right total knee arthroplasty, postop day #3 today. Medicine was consulted as patient was found to have hypoxia episodes, difficult to arouse, O2 sats noticed to be in 60s around 5:25 PM. Patient was placed on 4 L O2 via nasal cannula and O2 sats improved to 92% however tachycardiac, had 3 loose stools in the last 24 hours. C. difficile from 3/23 was negative. Patient also reported that she had vomiting x 4 today. She denies hematochezia or melena or any hematemesis. Stat KUB did not show any ileus or SBO, chest x-ray still pending, patient also received 20 mg IV Lasix   Allergies:   Allergies  Allergen Reactions  . Nutritional Supplements Anaphylaxis    Pt does not remember this happening  . Bee Venom     unknown  . Etodolac Nausea And Vomiting  . Morphine Other (See Comments)    Hallucinations  . Propoxyphene N-Acetaminophen Nausea And Vomiting  . Penicillins Rash      Past Medical History  Diagnosis Date  . Hyperthyroidism   . Toxic goiter   . Reflux   . Colon polyps   . Migraines   . 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 Obesity, Class III, BMI 40-49.9 (morbid obesity) 04/27/2010    02-17-14 reports some weight loss- intentional  . Pelvic pain     02-17-14 "states thinks its back related on left groin"  . Anemia   . Clotting disorder 02-17-14    S/p appendectomy-developed Pulmonary emboli-tx. warfarin-d/c 1 yr ago.  . Osteoarthritis      in back(chronic pain)  . Pulmonary emboli     s/p Appendectomy '13 [facility]  . Diabetes mellitus without complication     "borderline"- oral med  . Right knee pain   . Sleep apnea     cpap - settings at 4   . Bronchitis     hx of   . Anxiety   . Depression   . Kidney stones   . Neuromuscular disorder     hands and throat"spasmodic dysphonia", tremors - thimb and forefinger   . Nausea Nov 15, 2014    cycle of migraine headaches and nausea    Past Surgical History  Procedure Laterality Date  . Abdominal hysterectomy    . Zenkers diverticulum    . Kidney stone surgery    . Laparoscopic appendectomy  11/27/2011    Procedure: APPENDECTOMY LAPAROSCOPIC;  Surgeon: Adin Hector, MD;  Location: [facility];  Service: General;  Laterality: N/A;  . Cholecystectomy    . Knee arthroscopy  1998    right  . Hand surgery  2004    both hands  . Botox injection      for migranes  . Cataract extraction, bilateral Bilateral 03/2013   . Vocal cord surgery  02-17-14    01-12-14 done in [place] [facility](s/p selective denervation/reinnervation recurrent laryngeal nerve surgery)  . Appendectomy      3'13 [facility] s/p developed  Pulmonary Emboli  . Total knee revision Left 02/24/2014  Procedure: LEFT TOTAL KNEE REVISION;  Surgeon: Gearlean Alf, MD;  Location: [facility];  Service: Orthopedics;  Laterality: Left;  . Total knee arthroplasty Right 12/06/2014    Procedure: RIGHT TOTAL KNEE ARTHROPLASTY;  Surgeon: Gaynelle Arabian, MD;  Location: [facility];  Service: Orthopedics;  Laterality: Right;    Social History:  reports that she quit smoking about 32 years ago. She has never used smokeless tobacco. She reports that she does not drink alcohol or use illicit drugs.  Family History  Problem Relation Age of Onset  . Heart disease Mother     Review of Systems:   Constitutional: Denies fever, chills, diaphoresis, appetite change and fatigue.  HEENT: Denies photophobia, eye pain, redness, hearing loss, ear  pain, congestion, sore throat, rhinorrhea, sneezing, mouth sores, trouble swallowing, neck pain, neck stiffness and tinnitus.   Respiratory: Denies SOB, DOE, cough, chest tightness,  and wheezing.   Cardiovascular: Denies chest pain, palpitations and leg swelling.  Gastrointestinal: Please see history of present illness Genitourinary: Denies dysuria, urgency, frequency, hematuria, flank pain and difficulty urinating.  Musculoskeletal:  + chronic right knee problems Skin: Denies pallor, rash and wound.  Neurological: Denies dizziness, seizures, syncope, weakness, light-headedness, numbness and headaches.  Hematological: Denies adenopathy. Easy bruising, personal or family bleeding history  Psychiatric/Behavioral: Denies suicidal ideation, mood changes, confusion, nervousness, sleep disturbance and agitation   Physical Exam: Blood pressure 117/59, pulse 114, temperature 99.6 F (37.6 C), temperature source Oral, resp. rate 16, height 5\' 6"  (1.676 m), weight 104.327 kg (230 lb), SpO2 95 %. on 4 L O2 via nasal cannula  General: Alert and awake, oriented x3, not in any acute distress, speaking in full sentences softly. HEENT: normocephalic, atraumatic, anicteric sclera, pupils reactive to light and accommodation, EOMI, oropharynx clear CVS: S1-S2 clear, no murmur rubs or gallops Chest: Decreased breath sound at the bases Abdomen: Obese, soft nontender, nondistended, +bowel sounds, no organomegaly Extremities: no cyanosis, clubbing, trace edema noted bilaterally Neuro: Cranial nerves II-XII intact, no focal neurological deficits Psych: alert and oriented, stable mood and affect Skin: no rashes or lesions  Labs on Admission:  Basic Metabolic Panel:  Recent Labs Lab 12/08/14 0500 12/09/14 0508  NA 142 140  K 3.9 3.3*  CL 106 102  CO2 27 29  GLUCOSE 113* 103*  BUN 9 8  CREATININE 0.54 0.56  CALCIUM 8.3* 8.5   Liver Function Tests: No results for input(s): AST, ALT, ALKPHOS, BILITOT,  PROT, ALBUMIN in the last 168 hours. No results for input(s): LIPASE, AMYLASE in the last 168 hours. No results for input(s): AMMONIA in the last 168 hours. CBC:  Recent Labs Lab 12/08/14 0500 12/09/14 0508  WBC 7.2 6.6  HGB 10.7* 10.8*  HCT 35.5* 36.2  MCV 83.7 84.2  PLT 252 244   Cardiac Enzymes: No results for input(s): CKTOTAL, CKMB, CKMBINDEX, TROPONINI in the last 168 hours. BNP: Invalid input(s): POCBNP CBG:  Recent Labs Lab 12/08/14 1702 12/08/14 2235 12/09/14 0724 12/09/14 1201 12/09/14 1727  GLUCAP 96 89 86 89 95    Inpatient Medications:   Scheduled Meds: . clonazePAM  0.5 mg Oral QHS  . cyclobenzaprine  10 mg Oral QHS  . docusate sodium  100 mg Oral BID  . furosemide  20 mg Intravenous Once  . gabapentin  1,600 mg Oral QHS  . gabapentin  800 mg Oral Daily  . insulin aspart  0-15 Units Subcutaneous TID WC  . levothyroxine  25 mcg Oral QAC breakfast  . linagliptin  5 mg Oral Daily  . loperamide  2 mg Oral Once  . mometasone-formoterol  2 puff Inhalation BID  . pantoprazole  40 mg Oral Daily  . potassium chloride  40 mEq Oral BID  . rivaroxaban  10 mg Oral Q breakfast  . saccharomyces boulardii  250 mg Oral BID  . sertraline  100 mg Oral Daily   Continuous Infusions: . sodium chloride 50 mL/hr (12/09/14 1439)     Radiological Exams on Admission: Dg Abd 1 View  12/08/2014   CLINICAL DATA:  Postoperative nausea vomiting for 3 days. Recent knee surgery.  EXAM: ABDOMEN - 1 VIEW  COMPARISON:  12/02/2011  FINDINGS: Normal bowel gas pattern. No evidence of bowel obstruction, generalized adynamic ileus or gross free air. Surgical clips are upper quadrant stable from a prior cholecystectomy. No evidence of renal or ureteral stones. There are stable degenerative changes throughout visualized spine.  IMPRESSION: 1. No acute findings. No evidence of bowel obstruction or generalized adynamic ileus.   Electronically Signed   By: Lajean Manes M.D.   On: 12/08/2014  11:01    Impression/Recommendations Principal Problem:   OA (osteoarthritis) of knee status post right knee arthroplasty - Postop day #3, per orthopedics  Active Problems: Nausea, vomiting, diarrhea: Patient has a chronic history of intermittent nausea and diarrhea. She follows Dr. Juanita Craver as her gastroenterologist -  I spoke with Dr. Collene Mares in detail, who reported that patient has a history of irritable bowel syndrome. She has been extensively worked up in the past. - Agree with KUB which did not show any ileus or small bowel obstruction, C. difficile is negative, check GI pathogen panel - For now I will check CT abdomen and pelvis to rule out any colitis.  - Diet changed to clear liquids and placed on scheduled Zofran for 24 hours - Discontinued Colace, patient was getting scheduled BID Colace until yesterday.  - Will give 1 dose of loperamide.  Acute on chronic respiratory failure with hypoxia with tachycardia: Has history of pulmonary embolism, obesity hypoventilation, obstructive sleep apnea - Patient is on O2 at night chronically at home, currently on 4 L O2 via nasal cannula, O2 sats 95% - We will check BNP, troponin 1, Chest x-ray, agree with Lasix - Prior history of pulmonary embolism, hence ordered CT angiogram chest to rule out PE, currently on xarelto - Continue incentive spirometry, discontinued IV fluids - I have decreased her oxycodone to 5mg  q4hours as needed, and Dilaudid to avoid respiratory depression. - Recommend decreasing Neurontin - will check influenza panel, lactic acid, Procalcitonin - Continue duo nebs, dulera     DM (diabetes mellitus) type 2, uncontrolled - Blood sugars are controlled, continue sliding scale insulin, change to sensitive - Patient had received Decadron at the time of admission which likely caused hyperglycemia    Obesity, Class III, BMI 40-49.9 (morbid obesity) - Patient counseled on diet and weight control    ANXIETY DEPRESSION -  Continue Ativan as needed    OSA on CPAP  - Continue CPAP    Hypothyroidism - Check TSH, continue Synthroid  Hypokalemia - Replaced potassium this morning  [facility] medicine service will follow the patient, thank you for this consultation.  Time Spent on Admission: 1 hour    Domino Holten M.D. [facility] 12/09/2014, 5:58 PM

## 2014-12-09 NOTE — Progress Notes (Signed)
Physical Therapy Treatment Patient Details Name: Hayley [name] MRN: [id number] DOB: 1945/10/26 Today's Date: 12/09/2014    History of Present Illness R TKA    PT Comments    POD # 3 pt progressing slowly due to nauesea/vomiting/freq stools past 2 days.  Assisted OOB to amb to BR.  Assisted with hygiene.  Assisted with amb only to recliner per pt request due to MAX c/o fatigue/weakness and overall ill feeling".   Follow Up Recommendations  SNF     Equipment Recommendations       Recommendations for Other Services       Precautions / Restrictions Precautions Precautions: Knee;Fall Restrictions Weight Bearing Restrictions: No RLE Weight Bearing: Weight bearing as tolerated Other Position/Activity Restrictions: WBAT    Mobility  Bed Mobility Overal bed mobility: Needs Assistance Bed Mobility: Supine to Sit     Supine to sit: Min assist     General bed mobility comments: min A for RLE plus increased time  Transfers Overall transfer level: Needs assistance Equipment used: Rolling walker (2 wheeled) Transfers: Sit to/from Stand           General transfer comment: 25% VC's on proper tech and hand placement to push off plus increased time   Ambulation/Gait Ambulation/Gait assistance: Min guard;Min assist Ambulation Distance (Feet): 15 Feet Assistive device: Rolling walker (2 wheeled) Gait Pattern/deviations: Step-to pattern;Decreased stance time - right Gait velocity: decreased   General Gait Details: 25% VC's on proper walker to self distance and safety with turns.  Very slow moving.  Truely does not feel well.    Stairs            Wheelchair Mobility    Modified Rankin (Stroke Patients Only)       Balance                                    Cognition                            Exercises      General Comments        Pertinent Vitals/Pain Pain Assessment: 0-10 Pain Score: 5  Pain Location: R knee Pain  Descriptors / Indicators: Tightness;Sore Pain Intervention(s): Monitored during session;Premedicated before session;Repositioned;Ice applied    Home Living                      Prior Function            PT Goals (current goals can now be found in the care plan section) Progress towards PT goals: Progressing toward goals    Frequency  7X/week    PT Plan      Co-evaluation             End of Session Equipment Utilized During Treatment: Gait belt Activity Tolerance: Patient limited by fatigue Patient left: in chair;with call bell/phone within reach     Time: 1347-1420 PT Time Calculation (min) (ACUTE ONLY): 33 min  Charges:  $Gait Training: 8-22 mins $Therapeutic Activity: 8-22 mins                    G Codes:      Rica Koyanagi  PTA [facility]  Acute  Rehab Pager      [phone]

## 2014-12-09 NOTE — Progress Notes (Signed)
Pt placed on Auto CPAP 4-18 CMH20 with 4 LPM O2 via Pt home mask and tubing.  Pt tolerating well at this time, RT to monitor and assess as needed.

## 2014-12-09 NOTE — Progress Notes (Addendum)
Notified Arlee Muslim, PA of patient's hypoxemic episodes. Notified of the following: Patient was difficult to arouse when found sleeping without CPAP or 02 through nasal cannula with Sa02 sat of 60%. Unable to keep Sa02 above 90% on 3L [place] so patient placed on 4L [place]. O2 92%. HR 120.  Patient had 3 loose stools in last 24 hours. C-diff test from 3-23 is negative. Also notified that patient states she has vomited 4 times today. Chest x-ray and lasix ordered. Hospitalist consult also ordered.

## 2014-12-10 DIAGNOSIS — J9601 Acute respiratory failure with hypoxia: Secondary | ICD-10-CM

## 2014-12-10 DIAGNOSIS — M1711 Unilateral primary osteoarthritis, right knee: Principal | ICD-10-CM

## 2014-12-10 DIAGNOSIS — I2699 Other pulmonary embolism without acute cor pulmonale: Secondary | ICD-10-CM

## 2014-12-10 DIAGNOSIS — G4733 Obstructive sleep apnea (adult) (pediatric): Secondary | ICD-10-CM

## 2014-12-10 DIAGNOSIS — E1310 Other specified diabetes mellitus with ketoacidosis without coma: Secondary | ICD-10-CM

## 2014-12-10 DIAGNOSIS — E039 Hypothyroidism, unspecified: Secondary | ICD-10-CM

## 2014-12-10 LAB — BASIC METABOLIC PANEL
Anion gap: 10 (ref 5–15)
BUN: 8 mg/dL (ref 6–23)
CO2: 29 mmol/L (ref 19–32)
Calcium: 8.3 mg/dL — ABNORMAL LOW (ref 8.4–10.5)
Chloride: 100 mmol/L (ref 96–112)
Creatinine, Ser: 0.57 mg/dL (ref 0.50–1.10)
GFR calc Af Amer: >90 mL/min (ref >90)
GFR calc non Af Amer: >90 mL/min (ref >90)
Glucose, Bld: 90 mg/dL (ref 70–99)
Potassium: 3.6 mmol/L (ref 3.5–5.1)
Sodium: 139 mmol/L (ref 135–145)

## 2014-12-10 LAB — GLUCOSE, CAPILLARY
Glucose-Capillary: 81 mg/dL (ref 70–99)
Glucose-Capillary: 95 mg/dL (ref 70–99)
Glucose-Capillary: 91 mg/dL (ref 70–99)
Glucose-Capillary: 122 mg/dL — ABNORMAL HIGH (ref 70–99)

## 2014-12-10 LAB — INFLUENZA PANEL BY PCR (TYPE A & B)
H1N1 flu by pcr: NOT DETECTED
Influenza A By PCR: NEGATIVE
Influenza B By PCR: NEGATIVE

## 2014-12-10 LAB — CBC
HCT: 34.9 % — ABNORMAL LOW (ref 36.0–46.0)
Hemoglobin: 10.5 g/dL — ABNORMAL LOW (ref 12.0–15.0)
MCH: 25.2 pg — ABNORMAL LOW (ref 26.0–34.0)
MCHC: 30.1 g/dL (ref 30.0–36.0)
MCV: 83.9 fL (ref 78.0–100.0)
Platelets: 256 10*3/uL (ref 150–400)
RBC: 4.16 MIL/uL (ref 3.87–5.11)
RDW: 15.8 % — ABNORMAL HIGH (ref 11.5–15.5)
WBC: 7.2 10*3/uL (ref 4.0–10.5)

## 2014-12-10 MED ORDER — SACCHAROMYCES BOULARDII 250 MG PO CAPS: 250.0000 mg | ORAL_CAPSULE | Freq: Two times a day (BID) | ORAL | Status: DC

## 2014-12-10 MED ORDER — RIVAROXABAN 15 MG PO TABS
15.0000 mg | ORAL_TABLET | Freq: Two times a day (BID) | ORAL | Status: DC
  Filled 2014-12-10 (×4): qty 1

## 2014-12-10 MED ORDER — CYCLOBENZAPRINE HCL 10 MG PO TABS
10.0000 mg | ORAL_TABLET | Freq: Two times a day (BID) | ORAL | Status: DC | PRN
  Administered 2014-12-10 – 2014-12-13 (×6): 10 mg via ORAL
  Filled 2014-12-10 (×6): qty 1

## 2014-12-10 MED ORDER — LOPERAMIDE HCL 2 MG PO CAPS: 2.0000 mg | ORAL_CAPSULE | ORAL | Status: DC | PRN

## 2014-12-10 MED ORDER — HYDROMORPHONE HCL 1 MG/ML IJ SOLN: 0.5000 mg | INTRAMUSCULAR | Status: DC | PRN

## 2014-12-10 MED ORDER — FLUTICASONE PROPIONATE 50 MCG/ACT NA SUSP
1.0000 | Freq: Every day | NASAL | Status: DC
  Administered 2014-12-10 – 2014-12-12 (×3): 1 via NASAL
  Filled 2014-12-10: qty 16

## 2014-12-10 MED ORDER — HYDROMORPHONE HCL 2 MG PO TABS
2.0000 mg | ORAL_TABLET | Freq: Four times a day (QID) | ORAL | Status: DC | PRN
  Administered 2014-12-10 – 2014-12-13 (×14): 2 mg via ORAL
  Filled 2014-12-10 (×15): qty 1

## 2014-12-10 MED ORDER — RIVAROXABAN 15 MG PO TABS
15.0000 mg | ORAL_TABLET | Freq: Two times a day (BID) | ORAL | Status: DC
  Administered 2014-12-10 – 2014-12-13 (×7): 15 mg via ORAL
  Filled 2014-12-10 (×9): qty 1

## 2014-12-10 NOTE — Progress Notes (Signed)
[facility] PROGRESS NOTE  Hayley [name] CNO:[id number] DOB: Jan 16, 1946 DOA: 12/06/2014 PCP: Tommy Medal, MD  Assessment/Plan: 1-osteoarthritis: Status post right TKA -Post operative day #4; further treatment, evaluation and recommendations per Dr. Maureen Ralphs  2-nausea/vomiting and diarrhea: No fever, no elevation of white blood cells or acute signs of infection -Most likely secondary to her history of IBS with flares -Will continue the use of intermittent loperamide and minimize the use of Laxatives  -Will follow clinical response. -continue PRN antiemetics   3-acute on chronic respiratory failure with hypoxia: Secondary to bloody embolism, along with chronic obesity hypoventilation syndrome and obstructive sleep apnea -Patient has been started on full dose several total -Continue as needed oxygen supplementation -CPap daily at bedtime -Patient is breathing easier and good oxygen saturation on room air during my examination. -Please minimize the use of narcotics (less frequent dosages) -Influenza by PCR was negative; patient without any fever  -Continue Flonase and inhalers -Follow clinical response  4-diabetes mellitus type 2 -A1c pending at this moment -Continue sliding scale insulin  5-hypothyroidism: Continue Synthroid  6-depression/anxiety: Continue Zoloft  7-history of migraines: Continue as needed Imitrex  Code Status: Full code Family Communication: Husband at bedside Disposition Plan: Skilled nursing facility for short-term rehabilitation is anticipated; most likely she will be ready for discharge a around her/fter 12/12/13   Procedures:  Status post right TKA 12/06/2014  CT and-year-old chest: Positive for PE  Antibiotics:  None  HPI/Subjective: Feeling better and breathing easier. Denies chest pain, palpitations, vomiting or dysuria. Vision is afebrile and during my examination is complaining of some nausea, still with loose stools and right knee  pain.  Objective: Filed Vitals:   12/10/14 1441  BP: 132/76  Pulse: 103  Temp: 98.3 F (36.8 C)  Resp: 18    Intake/Output Summary (Last 24 hours) at 12/10/14 1519 Last data filed at 12/10/14 0855  Gross per 24 hour  Intake    240 ml  Output    851 ml  Net   -611 ml   Filed Weights   12/06/14 0707  Weight: 104.327 kg (230 lb)    Exam:   General:  Feeling better and breathing easier; complaining of headache, knee pain and back pain. Patient is still having some episode of loose stools but no further vomiting.  Cardiovascular: No rubs, no gallops, no murmur; S1 and S2 are pursued on exam  Respiratory: No wheezing, no crackles, scattered rhonchi bilaterally. Good oxygen saturation on room air  Abdomen: Obese, nontender, nondistended, no guarding  Musculoskeletal: Decreased range of motion and pain on her right knee (status post Right total knee arthroplasty); no cyanosis or clubbing. Trace edema bilaterally appreciated.  Data Reviewed: Basic Metabolic Panel:  Recent Labs Lab 12/07/14 0555 12/08/14 0500 12/09/14 0508 12/10/14 0453  NA 141 142 140 139  K 3.9 3.9 3.3* 3.6  CL 107 106 102 100  CO2 24 27 29 29   GLUCOSE 144* 113* 103* 90  BUN 10 9 8 8   CREATININE 0.57 0.54 0.56 0.57  CALCIUM 8.0* 8.3* 8.5 8.3*   CBC:  Recent Labs Lab 12/07/14 0555 12/08/14 0500 12/09/14 0508 12/10/14 0453  WBC 6.3 7.2 6.6 7.2  HGB 11.6* 10.7* 10.8* 10.5*  HCT 37.9 35.5* 36.2 34.9*  MCV 84.2 83.7 84.2 83.9  PLT 229 252 244 256   Cardiac Enzymes:  Recent Labs Lab 12/09/14 1858  TROPONINI <0.03   BNP (last 3 results)  Recent Labs  12/09/14 1858  BNP 46.7    ProBNP (  last 3 results)  Recent Labs  12/21/13 1700  PROBNP 82.3    CBG:  Recent Labs Lab 12/09/14 1201 12/09/14 1727 12/09/14 2222 12/10/14 0739 12/10/14 1125  GLUCAP 89 95 100* 81 95    Recent Results (from the past 240 hour(s))  Clostridium Difficile by PCR     Status: None    Collection Time: 12/08/14  2:31 PM  Result Value Ref Range Status   C difficile by pcr NEGATIVE NEGATIVE Final     Studies: Ct Angio Chest Pe W/cm &/or Wo Cm  12/09/2014   CLINICAL DATA:  Shortness of breath.  Hypoxia.  EXAM: CT ANGIOGRAPHY CHEST  CT ABDOMEN AND PELVIS WITH CONTRAST  TECHNIQUE: Multidetector CT imaging of the chest was performed using the standard protocol during bolus administration of intravenous contrast. Multiplanar CT image reconstructions and MIPs were obtained to evaluate the vascular anatomy. Multidetector CT imaging of the abdomen and pelvis was performed using the standard protocol during bolus administration of intravenous contrast.  CONTRAST:  169mL OMNIPAQUE IOHEXOL 350 MG/ML SOLN  COMPARISON:  Chest x-ray dated 12/09/2014 and CT angiogram of the chest dated 05/15/2014  FINDINGS: CTA CHEST FINDINGS  The patient has pulmonary emboli and left upper lobe and in the right lower lobe. There may be clot in the right upper lobe but this is not definitive. There is bibasilar atelectasis, most prominent at the right base posterior medially adjacent to the pulmonary embolism. RA/RV ratio is 0.9.  No effusions.  CT ABDOMEN and PELVIS FINDINGS  The gallbladder and appendix have been removed. There is hepatic steatosis. No focal lesions. Biliary tree, spleen, pancreas, adrenal glands, and kidneys are normal. There is diverticulosis of the descending and sigmoid portions of the colon without active inflammation. Bladder is normal. No acute osseous abnormality. Diffuse degenerative disc disease in the thoracic and lumbar spine.  Review of the MIP images confirms the above findings.  IMPRESSION: Segmental pulmonary emboli in the left upper lobe and right lower lobe and possibly right upper lobe. Atelectasis at the right lung base and to a lesser degree at the left lung base.  No acute abnormality of the abdomen. Hepatic steatosis. Distal colonic diverticulosis.   Electronically Signed   By:  Lorriane Shire M.D.   On: 12/09/2014 21:57   Ct Abdomen Pelvis W Contrast  12/09/2014   CLINICAL DATA:  Shortness of breath.  Hypoxia.  EXAM: CT ANGIOGRAPHY CHEST  CT ABDOMEN AND PELVIS WITH CONTRAST  TECHNIQUE: Multidetector CT imaging of the chest was performed using the standard protocol during bolus administration of intravenous contrast. Multiplanar CT image reconstructions and MIPs were obtained to evaluate the vascular anatomy. Multidetector CT imaging of the abdomen and pelvis was performed using the standard protocol during bolus administration of intravenous contrast.  CONTRAST:  114mL OMNIPAQUE IOHEXOL 350 MG/ML SOLN  COMPARISON:  Chest x-ray dated 12/09/2014 and CT angiogram of the chest dated 05/15/2014  FINDINGS: CTA CHEST FINDINGS  The patient has pulmonary emboli and left upper lobe and in the right lower lobe. There may be clot in the right upper lobe but this is not definitive. There is bibasilar atelectasis, most prominent at the right base posterior medially adjacent to the pulmonary embolism. RA/RV ratio is 0.9.  No effusions.  CT ABDOMEN and PELVIS FINDINGS  The gallbladder and appendix have been removed. There is hepatic steatosis. No focal lesions. Biliary tree, spleen, pancreas, adrenal glands, and kidneys are normal. There is diverticulosis of the descending and sigmoid portions of  the colon without active inflammation. Bladder is normal. No acute osseous abnormality. Diffuse degenerative disc disease in the thoracic and lumbar spine.  Review of the MIP images confirms the above findings.  IMPRESSION: Segmental pulmonary emboli in the left upper lobe and right lower lobe and possibly right upper lobe. Atelectasis at the right lung base and to a lesser degree at the left lung base.  No acute abnormality of the abdomen. Hepatic steatosis. Distal colonic diverticulosis.   Electronically Signed   By: Lorriane Shire M.D.   On: 12/09/2014 21:57   Dg Chest Port 1 View  12/09/2014   CLINICAL  DATA:  Acute onset of hypoxia. Four days status post knee surgery. Initial encounter.  EXAM: PORTABLE CHEST - 1 VIEW  COMPARISON:  Chest radiograph performed 10/26/2014  FINDINGS: The lungs are well-aerated. Bibasilar airspace opacities may reflect atelectasis or possibly mild pneumonia. Mild vascular congestion is seen. There is no evidence of pleural effusion or pneumothorax.  The cardiomediastinal silhouette is borderline normal in size. No acute osseous abnormalities are seen.  IMPRESSION: Bibasilar airspace opacities may reflect atelectasis or possibly mild pneumonia. Mild vascular congestion noted.   Electronically Signed   By: Garald Balding M.D.   On: 12/09/2014 18:06    Scheduled Meds: . clonazePAM  0.5 mg Oral QHS  . fluticasone  1 spray Each Nare Daily  . gabapentin  1,600 mg Oral QHS  . gabapentin  800 mg Oral Daily  . insulin aspart  0-5 Units Subcutaneous QHS  . insulin aspart  0-9 Units Subcutaneous TID WC  . ipratropium-albuterol  3 mL Nebulization TID  . levothyroxine  25 mcg Oral QAC breakfast  . mometasone-formoterol  2 puff Inhalation BID  . ondansetron (ZOFRAN) IV  4 mg Intravenous 4 times per day  . pantoprazole  40 mg Oral Daily  . Rivaroxaban  15 mg Oral BID WC  . saccharomyces boulardii  250 mg Oral BID  . sertraline  100 mg Oral Daily   Continuous Infusions:   Principal Problem:   OA (osteoarthritis) of knee Active Problems:   DM (diabetes mellitus) type 2, uncontrolled, with ketoacidosis   Obesity, Class III, BMI 40-49.9 (morbid obesity)   ANXIETY DEPRESSION   History of pulmonary embolus (PE)   OSA on CPAP   Hypothyroidism   Acute respiratory failure with hypoxia   Diarrhea    Time spent: 30 minutes    Barton Dubois  [facility] Pager [phone]. If 7PM-7AM, please contact night-coverage at [email or web address], password [facility] 12/10/2014, 3:19 PM  LOS: 4 days

## 2014-12-10 NOTE — Progress Notes (Signed)
[place] for Xarelto Indication: pulmonary embolus  Allergies  Allergen Reactions  . Nutritional Supplements Anaphylaxis    Pt does not remember this happening  . Bee Venom     unknown  . Etodolac Nausea And Vomiting  . Morphine Other (See Comments)    Hallucinations  . Propoxyphene N-Acetaminophen Nausea And Vomiting  . Penicillins Rash    Patient Measurements: Height: 5\' 6"  (167.6 cm) Weight: 230 lb (104.327 kg) IBW/kg (Calculated) : 59.3   Vital Signs: Temp: 99.2 F (37.3 C) (03/25 0515) Temp Source: Oral (03/25 0515) BP: 118/87 mmHg (03/25 0515) Pulse Rate: 102 (03/25 0515)  Labs:  Recent Labs  12/08/14 0500 12/09/14 0508 12/09/14 1858 12/10/14 0453  HGB 10.7* 10.8*  --  10.5*  HCT 35.5* 36.2  --  34.9*  PLT 252 244  --  256  CREATININE 0.54 0.56  --  0.57  TROPONINI  --   --  <0.03  --     Estimated Creatinine Clearance: 81 mL/min (by C-G formula based on Cr of 0.57).   Medical History: Past Medical History  Diagnosis Date  . Hyperthyroidism   . Toxic goiter   . Reflux   . Colon polyps   . Migraines   . 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 Obesity, Class III, BMI 40-49.9 (morbid obesity) 04/27/2010    02-17-14 reports some weight loss- intentional  . Pelvic pain     02-17-14 "states thinks its back related on left groin"  . Anemia   . Clotting disorder 02-17-14    S/p appendectomy-developed Pulmonary emboli-tx. warfarin-d/c 1 yr ago.  . Osteoarthritis     in back(chronic pain)  . Pulmonary emboli     s/p Appendectomy [facility]13 [facility]  . Diabetes mellitus without complication     "borderline"- oral med  . Right knee pain   . Sleep apnea     cpap - settings at 4   . Bronchitis     hx of   . Anxiety   . Depression   . Kidney stones   . Neuromuscular disorder     hands and throat"spasmodic dysphonia", tremors - thimb and forefinger   . Nausea Nov 15, 2014   cycle of migraine headaches and nausea    Medications:  Prescriptions prior to admission  Medication Sig Dispense Refill Last Dose  . albuterol (PROVENTIL HFA;VENTOLIN HFA) 108 (90 BASE) MCG/ACT inhaler Inhale 1 puff into the lungs every 6 (six) hours as needed for wheezing or shortness of breath.   12/05/2014 at Unknown time  . albuterol (PROVENTIL) (2.5 MG/3ML) 0.083% nebulizer solution Take 2.5 mg by nebulization every 6 (six) hours as needed for wheezing or shortness of breath.   More than a month at Unknown time  . benzonatate (TESSALON) 100 MG capsule Take 100 mg by mouth 3 (three) times daily as needed for cough.   12/05/2014 at pm  . cholecalciferol (VITAMIN D) 1000 UNITS tablet Take 1,000 Units by mouth daily.   Past Week at Unknown time  . clonazePAM (KLONOPIN) 0.5 MG tablet Take 0.5 mg by mouth at bedtime.   12/05/2014 at pm  . cyclobenzaprine (FLEXERIL) 10 MG tablet Take 10 mg by mouth at bedtime.    12/05/2014 at pm  . diclofenac (FLECTOR) 1.3 % PTCH Place 1 patch onto the skin daily as needed (for pain). Apply to back   Past Month at Unknown time  .  diclofenac sodium (VOLTAREN) 1 % GEL Apply 4 g topically daily as needed (Knee pain).   Past Month at Unknown time  . EPINEPHrine (EPIPEN) 0.3 mg/0.3 mL DEVI Inject 0.3 mg into the muscle once as needed (Allergic reaction).    unknown  . fluorometholone (FML) 0.1 % ophthalmic suspension Place 1 drop into both eyes daily as needed (Eye allergies).   0 Past Week at Unknown time  . fluticasone (FLONASE) 50 MCG/ACT nasal spray Place 1 spray into both nostrils daily as needed for allergies or rhinitis.   Past Month at Unknown time  . gabapentin (NEURONTIN) 800 MG tablet Take 800-1,600 mg by mouth 2 (two) times daily. 800 in the morning and 1600 at pm   12/06/2014 at 0430  . HYDROcodone-acetaminophen (NORCO/VICODIN) 5-325 MG per tablet Take two tablets by mouth every four hours for pain (Patient not taking: Reported on 11/30/2014) 360 tablet 0 Past  Week at Unknown time  . ketorolac (TORADOL) 10 MG tablet Take 10 mg by mouth every 6 (six) hours as needed (for headache).   Past Month at Unknown time  . levothyroxine (SYNTHROID, LEVOTHROID) 25 MCG tablet Take 25 mcg by mouth every morning.    12/06/2014 at 0430  . mometasone-formoterol (DULERA) 100-5 MCG/ACT AERO Inhale 2 puffs into the lungs 2 (two) times daily.   12/06/2014 at 0430  . nystatin (MYCOSTATIN) 100000 UNIT/ML suspension Take 5 mLs by mouth daily as needed (Yeast).   More than a month at Unknown time  . ondansetron (ZOFRAN) 4 MG tablet Take 4 mg by mouth every 8 (eight) hours as needed for nausea or vomiting.   12/06/2014 at 0430  . ondansetron (ZOFRAN-ODT) 8 MG disintegrating tablet Take 8 mg by mouth every 8 (eight) hours as needed for nausea or vomiting.   12/06/2014 at 0430  . oxyCODONE-acetaminophen (PERCOCET) 10-325 MG per tablet Take 0.5 tablets by mouth 2 (two) times daily as needed for pain.   12/05/2014 at am  . pantoprazole (PROTONIX) 40 MG tablet Take 40 mg by mouth daily.   12/06/2014 at 0430  . Phenazopyridine HCl (AZO TABS PO) Take 1 tablet by mouth daily as needed (UTI).   More than a month at Unknown time  . primidone (MYSOLINE) 50 MG tablet Take 50 mg by mouth at bedtime.   05/14/2014 at Unknown time  . Probiotic Product (PROBIOTIC DAILY PO) Take 1 capsule by mouth daily.   12/05/2014 at pm  . promethazine (PHENERGAN) 25 MG tablet Take 25 mg by mouth every 6 (six) hours as needed for nausea or vomiting.   More than a month at Unknown time  . rizatriptan (MAXALT) 10 MG tablet Take 10 mg by mouth as needed for migraine (migraine). May repeat in 2 hours if needed   12/05/2014 at pm  . sertraline (ZOLOFT) 100 MG tablet Take 100 mg by mouth daily.   12/06/2014 at 0430  . sitaGLIPtin (JANUVIA) 100 MG tablet Take 100 mg by mouth every evening.    Past Week at Unknown time  . SUMAtriptan (IMITREX) 6 MG/0.5ML SOLN injection Inject 6 mg into the skin every 2 (two) hours as needed for  migraine or headache. May repeat in 2 hours if headache persists or recurs.   Past Week at Unknown time  . valACYclovir (VALTREX) 1000 MG tablet Take 2,000 mg by mouth 2 (two) times daily as needed (Fever blisters).   2 Past Month at Unknown time   Scheduled:  . clonazePAM  0.5 mg Oral QHS  .  cyclobenzaprine  10 mg Oral QHS  . gabapentin  1,600 mg Oral QHS  . gabapentin  800 mg Oral Daily  . insulin aspart  0-5 Units Subcutaneous QHS  . insulin aspart  0-9 Units Subcutaneous TID WC  . ipratropium-albuterol  3 mL Nebulization TID  . levothyroxine  25 mcg Oral QAC breakfast  . mometasone-formoterol  2 puff Inhalation BID  . ondansetron (ZOFRAN) IV  4 mg Intravenous 4 times per day  . pantoprazole  40 mg Oral Daily  . potassium chloride  40 mEq Oral BID  . Rivaroxaban  15 mg Oral BID WC  . saccharomyces boulardii  250 mg Oral BID  . sertraline  100 mg Oral Daily   Infusions:    Assessment: 28 yoF s/p right total knee 12/06/14, was placed on Xarelto 10mg  daily for DVT prophylaxis. Has PMH of PE.  On 12/09/14 patient developed hypoxia and CT angio demonstrated multiple segmental pulmonary emboli.  Orders were received to increase Xarelto to achieve full anticoagulation and for pharmacy to assist with dosing.   Goal of Therapy:  Full Anticoagulation   Today, 12/10/2014: D#1 Xarelto 15 mg PO BID.  First dose already administered. POD #4 following R TKA. Hgb slightly low but stable post-op.  Pltc WNL SCr WNL, stable.  Estimated CrCl well above 30 mL/min threshold No bleeding reported.   Plan:  1. Xarelto 15 mg PO BID with meals x 21 days, then change to 20 mg PO daily with food. 2. Update patient teaching for Xarelto to full-dose regimen, provide with informational materials and voucher card to cover cost of first Xarelto prescription (up to 30-day supply).  Visited patient to update teaching and discussed briefly, but she clearly was not feeling well today.  Since the plan is for  patient to remain hospitalized until Monday 3/28, will revisit Monday morning and complete teaching.  Did leave the informational materials for her to review if she is feeling better this weekend.  Clayburn Pert, PharmD, BCPS Pager: [phone] 12/10/2014  8:52 AM

## 2014-12-10 NOTE — Progress Notes (Signed)
RT Note:  Placed patient on CPAP via [facility], (home mask and tubing) auto titrate settings with 3 lpm O2 bleed in.  Patient tolerating well at this time.

## 2014-12-10 NOTE — Progress Notes (Signed)
ANTICOAGULATION CONSULT NOTE - Initial Consult  Pharmacy Consult for Xarelto Indication: pulmonary embolus  Allergies  Allergen Reactions  . Nutritional Supplements Anaphylaxis    Pt does not remember this happening  . Bee Venom     unknown  . Etodolac Nausea And Vomiting  . Morphine Other (See Comments)    Hallucinations  . Propoxyphene N-Acetaminophen Nausea And Vomiting  . Penicillins Rash    Patient Measurements: Height: 5\' 6"  (167.6 cm) Weight: 230 lb (104.327 kg) IBW/kg (Calculated) : 59.3   Vital Signs: Temp: 98.8 F (37.1 C) (03/24 2215) Temp Source: Oral (03/24 2215) BP: 128/66 mmHg (03/24 2215) Pulse Rate: 102 (03/24 2215)  Labs:  Recent Labs  12/07/14 0555 12/08/14 0500 12/09/14 0508 12/09/14 1858  HGB 11.6* 10.7* 10.8*  --   HCT 37.9 35.5* 36.2  --   PLT 229 252 244  --   CREATININE 0.57 0.54 0.56  --   TROPONINI  --   --   --  <0.03    Estimated Creatinine Clearance: 81 mL/min (by C-G formula based on Cr of 0.56).   Medical History: Past Medical History  Diagnosis Date  . Hyperthyroidism   . Toxic goiter   . Reflux   . Colon polyps   . Migraines   . 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 Obesity, Class III, BMI 40-49.9 (morbid obesity) 04/27/2010    02-17-14 reports some weight loss- intentional  . Pelvic pain     02-17-14 "states thinks its back related on left groin"  . Anemia   . Clotting disorder 02-17-14    S/p appendectomy-developed Pulmonary emboli-tx. warfarin-d/c 1 yr ago.  . Osteoarthritis     in back(chronic pain)  . Pulmonary emboli     s/p Appendectomy '13 [facility]  . Diabetes mellitus without complication     "borderline"- oral med  . Right knee pain   . Sleep apnea     cpap - settings at 4   . Bronchitis     hx of   . Anxiety   . Depression   . Kidney stones   . Neuromuscular disorder     hands and throat"spasmodic dysphonia", tremors - thimb and forefinger   . Nausea  Nov 15, 2014    cycle of migraine headaches and nausea    Medications:  Prescriptions prior to admission  Medication Sig Dispense Refill Last Dose  . albuterol (PROVENTIL HFA;VENTOLIN HFA) 108 (90 BASE) MCG/ACT inhaler Inhale 1 puff into the lungs every 6 (six) hours as needed for wheezing or shortness of breath.   12/05/2014 at Unknown time  . albuterol (PROVENTIL) (2.5 MG/3ML) 0.083% nebulizer solution Take 2.5 mg by nebulization every 6 (six) hours as needed for wheezing or shortness of breath.   More than a month at Unknown time  . benzonatate (TESSALON) 100 MG capsule Take 100 mg by mouth 3 (three) times daily as needed for cough.   12/05/2014 at pm  . cholecalciferol (VITAMIN D) 1000 UNITS tablet Take 1,000 Units by mouth daily.   Past Week at Unknown time  . clonazePAM (KLONOPIN) 0.5 MG tablet Take 0.5 mg by mouth at bedtime.   12/05/2014 at pm  . cyclobenzaprine (FLEXERIL) 10 MG tablet Take 10 mg by mouth at bedtime.    12/05/2014 at pm  . diclofenac (FLECTOR) 1.3 % PTCH Place 1 patch onto the skin daily as needed (for pain). Apply to back   Past  Month at Unknown time  . diclofenac sodium (VOLTAREN) 1 % GEL Apply 4 g topically daily as needed (Knee pain).   Past Month at Unknown time  . EPINEPHrine (EPIPEN) 0.3 mg/0.3 mL DEVI Inject 0.3 mg into the muscle once as needed (Allergic reaction).    unknown  . fluorometholone (FML) 0.1 % ophthalmic suspension Place 1 drop into both eyes daily as needed (Eye allergies).   0 Past Week at Unknown time  . fluticasone (FLONASE) 50 MCG/ACT nasal spray Place 1 spray into both nostrils daily as needed for allergies or rhinitis.   Past Month at Unknown time  . gabapentin (NEURONTIN) 800 MG tablet Take 800-1,600 mg by mouth 2 (two) times daily. 800 in the morning and 1600 at pm   12/06/2014 at 0430  . HYDROcodone-acetaminophen (NORCO/VICODIN) 5-325 MG per tablet Take two tablets by mouth every four hours for pain (Patient not taking: Reported on 11/30/2014) 360  tablet 0 Past Week at Unknown time  . ketorolac (TORADOL) 10 MG tablet Take 10 mg by mouth every 6 (six) hours as needed (for headache).   Past Month at Unknown time  . levothyroxine (SYNTHROID, LEVOTHROID) 25 MCG tablet Take 25 mcg by mouth every morning.    12/06/2014 at 0430  . mometasone-formoterol (DULERA) 100-5 MCG/ACT AERO Inhale 2 puffs into the lungs 2 (two) times daily.   12/06/2014 at 0430  . nystatin (MYCOSTATIN) 100000 UNIT/ML suspension Take 5 mLs by mouth daily as needed (Yeast).   More than a month at Unknown time  . ondansetron (ZOFRAN) 4 MG tablet Take 4 mg by mouth every 8 (eight) hours as needed for nausea or vomiting.   12/06/2014 at 0430  . ondansetron (ZOFRAN-ODT) 8 MG disintegrating tablet Take 8 mg by mouth every 8 (eight) hours as needed for nausea or vomiting.   12/06/2014 at 0430  . oxyCODONE-acetaminophen (PERCOCET) 10-325 MG per tablet Take 0.5 tablets by mouth 2 (two) times daily as needed for pain.   12/05/2014 at am  . pantoprazole (PROTONIX) 40 MG tablet Take 40 mg by mouth daily.   12/06/2014 at 0430  . Phenazopyridine HCl (AZO TABS PO) Take 1 tablet by mouth daily as needed (UTI).   More than a month at Unknown time  . primidone (MYSOLINE) 50 MG tablet Take 50 mg by mouth at bedtime.   05/14/2014 at Unknown time  . Probiotic Product (PROBIOTIC DAILY PO) Take 1 capsule by mouth daily.   12/05/2014 at pm  . promethazine (PHENERGAN) 25 MG tablet Take 25 mg by mouth every 6 (six) hours as needed for nausea or vomiting.   More than a month at Unknown time  . rizatriptan (MAXALT) 10 MG tablet Take 10 mg by mouth as needed for migraine (migraine). May repeat in 2 hours if needed   12/05/2014 at pm  . sertraline (ZOLOFT) 100 MG tablet Take 100 mg by mouth daily.   12/06/2014 at 0430  . sitaGLIPtin (JANUVIA) 100 MG tablet Take 100 mg by mouth every evening.    Past Week at Unknown time  . SUMAtriptan (IMITREX) 6 MG/0.5ML SOLN injection Inject 6 mg into the skin every 2 (two) hours as  needed for migraine or headache. May repeat in 2 hours if headache persists or recurs.   Past Week at Unknown time  . valACYclovir (VALTREX) 1000 MG tablet Take 2,000 mg by mouth 2 (two) times daily as needed (Fever blisters).   2 Past Month at Unknown time   Scheduled:  .  clonazePAM  0.5 mg Oral QHS  . cyclobenzaprine  10 mg Oral QHS  . gabapentin  1,600 mg Oral QHS  . gabapentin  800 mg Oral Daily  . insulin aspart  0-5 Units Subcutaneous QHS  . insulin aspart  0-9 Units Subcutaneous TID WC  . ipratropium-albuterol  3 mL Nebulization TID  . levothyroxine  25 mcg Oral QAC breakfast  . mometasone-formoterol  2 puff Inhalation BID  . ondansetron (ZOFRAN) IV  4 mg Intravenous 4 times per day  . pantoprazole  40 mg Oral Daily  . potassium chloride  40 mEq Oral BID  . Rivaroxaban  15 mg Oral BID WC  . saccharomyces boulardii  250 mg Oral BID  . sertraline  100 mg Oral Daily   Infusions:    Assessment: 38 yoF s/p right total knee 4 days ago.  On Xarelto 10mg  daily for DVT prophylaxis now with PE. (pt hx PE 2013).  Xarelto increased to full dose per Rx. Goal of Therapy:  Full Anticoagulation   Plan:   Xarelto 15mg  bid x 21 days then 20mg  daily  F/u Scr/DD interactions/bleeding  Lawana Pai R 12/10/2014,2:35 AM

## 2014-12-10 NOTE — Progress Notes (Signed)
RN paged this NP because CTA chest positive for PE (pt has hx PE in 2013). She is s/p right total knee by Dr. Maureen Ralphs 4 days ago and has been on DVT ppx dose Xarelto 10mg  po qd. Because of need to increase dose for PE, this NP paged ortho on call for Dr. Maureen Ralphs who was OK with raising dose. Xarelto ordered for PE per pharmacy dosing.  Clance Boll, NP [facility]

## 2014-12-10 NOTE — Plan of Care (Signed)
Problem: Phase III Progression Outcomes Goal: Anticoagulant follow-up in place Outcome: Not Applicable Date Met:  63/49/49 Xarelto VTE, no f/u needed.  Problem: Discharge Progression Outcomes Goal: Barriers To Progression Addressed/Resolved Outcome: Progressing Multiple Pulmonary Emboli

## 2014-12-10 NOTE — Progress Notes (Signed)
CSW is assisting with d/c planning. Spoke with MD/ PA. Pt is not stable for d/c. [place] updated. CSW will continue to follow to assist with d/c planning.  Werner Lean LCSW [phone]

## 2014-12-10 NOTE — Progress Notes (Signed)
Subjective: 4 Days Post-Op Procedure(s) (LRB): RIGHT TOTAL KNEE ARTHROPLASTY (Right) Patient reports pain as mild and moderate.   Patient seen in rounds with Dr. Wynelle Link. Patient is sitting up ion bed.  Hypoxia noted yesterday.  Med consult called.  CT proved positive for pulmonary emboli.  Xarelto increased. Greatly appreciate Medicine Consult and help.  Objective: Vital signs in last 24 hours: Temp:  [98.5 F (36.9 C)-99.6 F (37.6 C)] 99.2 F (37.3 C) (03/25 0515) Pulse Rate:  [102-120] 102 (03/25 0515) Resp:  [16-20] 20 (03/25 0515) BP: (117-134)/(59-87) 118/87 mmHg (03/25 0515) SpO2:  [60 %-97 %] 94 % (03/25 0515)  Intake/Output from previous day:  Intake/Output Summary (Last 24 hours) at 12/10/14 0728 Last data filed at 12/10/14 0515  Gross per 24 hour  Intake    180 ml  Output    851 ml  Net   -671 ml     Labs:  Recent Labs  12/08/14 0500 12/09/14 0508 12/10/14 0453  HGB 10.7* 10.8* 10.5*    Recent Labs  12/09/14 0508 12/10/14 0453  WBC 6.6 7.2  RBC 4.30 4.16  HCT 36.2 34.9*  PLT 244 256    Recent Labs  12/09/14 0508 12/10/14 0453  NA 140 139  K 3.3* 3.6  CL 102 100  CO2 29 29  BUN 8 8  CREATININE 0.56 0.57  GLUCOSE 103* 90  CALCIUM 8.5 8.3*   No results for input(s): LABPT, INR in the last 72 hours.  EXAM General - Patient is Alert, Appropriate and Oriented Extremity - Neurovascular intact Sensation intact distally Dorsiflexion/Plantar flexion intact Dressing/Incision - clean, dry, no drainage Motor Function - intact, moving foot and toes well on exam.   Past Medical History  Diagnosis Date  . Hyperthyroidism   . Toxic goiter   . Reflux   . Colon polyps   . Migraines   . 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 Obesity, Class III, BMI 40-49.9 (morbid obesity) 04/27/2010    02-17-14 reports some weight loss- intentional  . Pelvic pain     02-17-14 "states thinks its back related  on left groin"  . Anemia   . Clotting disorder 02-17-14    S/p appendectomy-developed Pulmonary emboli-tx. warfarin-d/c 1 yr ago.  . Osteoarthritis     in back(chronic pain)  . Pulmonary emboli     s/p Appendectomy '13 [facility]  . Diabetes mellitus without complication     "borderline"- oral med  . Right knee pain   . Sleep apnea     cpap - settings at 4   . Bronchitis     hx of   . Anxiety   . Depression   . Kidney stones   . Neuromuscular disorder     hands and throat"spasmodic dysphonia", tremors - thimb and forefinger   . Nausea Nov 15, 2014    cycle of migraine headaches and nausea    Assessment/Plan: 4 Days Post-Op Procedure(s) (LRB): RIGHT TOTAL KNEE ARTHROPLASTY (Right) Principal Problem:   OA (osteoarthritis) of knee Active Problems:   DM (diabetes mellitus) type 2, uncontrolled, with ketoacidosis   Obesity, Class III, BMI 40-49.9 (morbid obesity)   ANXIETY DEPRESSION   History of pulmonary embolus (PE)   OSA on CPAP   Hypothyroidism   Acute respiratory failure with hypoxia   Diarrhea  Estimated body mass index is 37.14 kg/(m^2) as calculated from the following:   Height as of this encounter: 5'  6" (1.676 m).   Weight as of this encounter: 104.327 kg (230 lb).   DVT Prophylaxis - Xarelto Weight-Bearing as tolerated to right leg If she needs Heparin, that is okay from an Ortho standpoint. Will keep here thru the weekend.  Arlee Muslim, PA-C Orthopaedic Surgery 12/10/2014, 7:28 AM

## 2014-12-10 NOTE — Progress Notes (Addendum)
Physical Therapy Treatment Patient Details Name: Hayley [name] MRN: [id number] DOB: Apr 05, 1946 Today's Date: 12/10/2014    History of Present Illness R TKA    PT Comments    POD # 4 am session.  Assisted pt OOb to BR then amb in hallway limited distance due to fatigue.  Returned to room to perform TKR TE's followed by ICE. Pt behaving oddly.  Random off the wall comments, impaired safety cognition and some wild stories.  Bed and chair alarm activited.  Follow Up Recommendations  SNF     Equipment Recommendations       Recommendations for Other Services       Precautions / Restrictions Precautions Precautions: Knee;Fall Restrictions Weight Bearing Restrictions: No RLE Weight Bearing: Weight bearing as tolerated    Mobility  Bed Mobility Overal bed mobility: Needs Assistance Bed Mobility: Supine to Sit     Supine to sit: Min assist     General bed mobility comments: min A for RLE plus increased time  Transfers Overall transfer level: Needs assistance Equipment used: Rolling walker (2 wheeled) Transfers: Sit to/from Stand Sit to Stand: Min guard;Min assist         General transfer comment: 25% VC's on proper tech and hand placement to push off plus increased time   Ambulation/Gait Ambulation/Gait assistance: Min guard;Min assist Ambulation Distance (Feet): 42 Feet Assistive device: Rolling walker (2 wheeled) Gait Pattern/deviations: Step-to pattern;Decreased stance time - right Gait velocity: decreased   General Gait Details: 25% VC's on proper walker to self distance and safety with turns.  Very slow moving.  Amb on 2 lts nasal avg sats 96%.   Stairs            Wheelchair Mobility    Modified Rankin (Stroke Patients Only)       Balance                                    Cognition Arousal/Alertness: Awake/alert Behavior During Therapy: Restless;Anxious Overall Cognitive Status: Impaired/Different from baseline Area of  Impairment: Orientation               General Comments: exibiting abnormal behavior and random out of place comments.  Hot and cold behavior.  Also, demonstrates impaired safety cognition.  Ex pt thought the sharpe box looks "like the Devil".    Exercises   Total Knee Replacement TE's 10 reps B LE ankle pumps 10 reps towel squeezes 10 reps knee presses 10 reps heel slides  10 reps SAQ's 10 reps SLR's 10 reps ABD Followed by ICE     General Comments        Pertinent Vitals/Pain Pain Assessment: 0-10 Pain Score: 5  Pain Location: R knee Pain Descriptors / Indicators: Constant;Tingling;Tender;Sore Pain Intervention(s): Monitored during session;Premedicated before session;Repositioned;Ice applied    Home Living                      Prior Function            PT Goals (current goals can now be found in the care plan section) Progress towards PT goals: Progressing toward goals    Frequency  7X/week    PT Plan      Co-evaluation             End of Session Equipment Utilized During Treatment: Gait belt Activity Tolerance: Patient limited by fatigue;Other (comment) (behavior) Patient left: in  chair;with call bell/phone within reach     Time: 0855-0937 PT Time Calculation (min) (ACUTE ONLY): 42 min  Charges:  $Gait Training: 8-22 mins $Therapeutic Exercise: 8-22 mins $Therapeutic Activity: 8-22 mins                    G Codes:      Rica Koyanagi  PTA [facility]  Acute  Rehab Pager      [phone]

## 2014-12-11 DIAGNOSIS — R197 Diarrhea, unspecified: Secondary | ICD-10-CM

## 2014-12-11 LAB — PROCALCITONIN: Procalcitonin: <0.1 ng/mL

## 2014-12-11 LAB — BASIC METABOLIC PANEL
Anion gap: 10 (ref 5–15)
BUN: 5 mg/dL — ABNORMAL LOW (ref 6–23)
CO2: 30 mmol/L (ref 19–32)
Calcium: 8.7 mg/dL (ref 8.4–10.5)
Chloride: 100 mmol/L (ref 96–112)
Creatinine, Ser: 0.57 mg/dL (ref 0.50–1.10)
GFR calc Af Amer: >90 mL/min (ref >90)
GFR calc non Af Amer: >90 mL/min (ref >90)
Glucose, Bld: 117 mg/dL — ABNORMAL HIGH (ref 70–99)
Potassium: 3.6 mmol/L (ref 3.5–5.1)
Sodium: 140 mmol/L (ref 135–145)

## 2014-12-11 LAB — GLUCOSE, CAPILLARY
Glucose-Capillary: 102 mg/dL — ABNORMAL HIGH (ref 70–99)
Glucose-Capillary: 113 mg/dL — ABNORMAL HIGH (ref 70–99)
Glucose-Capillary: 135 mg/dL — ABNORMAL HIGH (ref 70–99)
Glucose-Capillary: 137 mg/dL — ABNORMAL HIGH (ref 70–99)

## 2014-12-11 LAB — HEMOGLOBIN A1C
Hgb A1c MFr Bld: 7.8 % — ABNORMAL HIGH (ref 4.8–5.6)
Mean Plasma Glucose: 177 mg/dL

## 2014-12-11 NOTE — Progress Notes (Signed)
PT Cancellation Note  Patient Details Name: Hayley [name] MRN: [id number] DOB: 03-03-1946   Cancelled Treatment:    Reason Eval/Treat Not Completed: Other (comment);Medical issues which prohibited therapy; OK for therapy when cleared by medicine---per Ortho MD; will defer at this time and check back as schedule permits.   [facility] 12/11/2014, 9:31 AM

## 2014-12-11 NOTE — Progress Notes (Signed)
RT placed pt on auto titrate CPAP via home ffm with 4Lpm of oxygen bled in. Sterile water was added for humidification. Pt states she is comfortable and is tolerating CPAP well at this time. RT will continue to monitor as needed.

## 2014-12-11 NOTE — Progress Notes (Signed)
[facility] PROGRESS NOTE  Hayley [name] ZOX:[id number] DOB: 1945/11/08 DOA: 12/06/2014 PCP: Tommy Medal, MD  Assessment/Plan: 1-osteoarthritis: Status post right TKA -Post operative day #5; further treatment, evaluation and recommendations per Dr. Maureen Ralphs -will need SNF at discharge for rehab  2-nausea/vomiting and diarrhea: No fever, no elevation of white blood cells or acute signs of infection -Most likely secondary to her history of IBS with flares -Will continue the use of intermittent loperamide and minimize the use of Laxatives  -Will follow clinical response. -continue PRN antiemetics   3-acute on chronic respiratory failure with hypoxia: Secondary to pulmonary embolism, along with chronic obesity hypoventilation syndrome and obstructive sleep apnea -Patient has been started on full dose several total -Continue as needed oxygen supplementation -CPAP daily at bedtime -Patient is breathing easier and good oxygen saturation on room air during my examination. -Please minimize the use of narcotics (less frequent dosages) -Influenza by PCR was negative; patient without any fever  -Continue Flonase and inhalers -ok to participate with PT/OT  4-diabetes mellitus type 2 -A1c pending at this moment -Continue sliding scale insulin  5-hypothyroidism: Continue Synthroid  6-depression/anxiety: Continue Zoloft  7-history of migraines: Continue as needed Imitrex  Code Status: Full code Family Communication: Husband at bedside Disposition Plan: Skilled nursing facility for short-term rehabilitation is anticipated; most likely she will be ready for discharge a around her/fter 12/12/13   Procedures:  Status post right TKA 12/06/2014  CT and-year-old chest: Positive for PE  Antibiotics:  None  HPI/Subjective: Feeling better and breathing easier. Denies chest pain and major SOB. Reports pain is better controlled  Objective: Filed Vitals:   12/11/14 1410  BP: 137/79   Pulse: 100  Temp: 96.3 F (35.7 C)  Resp: 20    Intake/Output Summary (Last 24 hours) at 12/11/14 1528 Last data filed at 12/11/14 1323  Gross per 24 hour  Intake    720 ml  Output      0 ml  Net    720 ml   Filed Weights   12/06/14 0707  Weight: 104.327 kg (230 lb)    Exam:   General:  Feeling better and breathing easier; reprots pain to be 3/10  Cardiovascular: No rubs, no gallops, no murmur; S1 and S2 are pursued on exam  Respiratory: No wheezing, no crackles, scattered rhonchi bilaterally. Good oxygen saturation on room air  Abdomen: Obese, nontender, nondistended, no guarding  Musculoskeletal: Decreased range of motion and pain on her right knee (status post Right total knee arthroplasty); no cyanosis or clubbing. Trace edema bilaterally appreciated.  Data Reviewed: Basic Metabolic Panel:  Recent Labs Lab 12/07/14 0555 12/08/14 0500 12/09/14 0508 12/10/14 0453 12/11/14 0503  NA 141 142 140 139 140  K 3.9 3.9 3.3* 3.6 3.6  CL 107 106 102 100 100  CO2 24 27 29 29 30   GLUCOSE 144* 113* 103* 90 117*  BUN 10 9 8 8  5*  CREATININE 0.57 0.54 0.56 0.57 0.57  CALCIUM 8.0* 8.3* 8.5 8.3* 8.7   CBC:  Recent Labs Lab 12/07/14 0555 12/08/14 0500 12/09/14 0508 12/10/14 0453  WBC 6.3 7.2 6.6 7.2  HGB 11.6* 10.7* 10.8* 10.5*  HCT 37.9 35.5* 36.2 34.9*  MCV 84.2 83.7 84.2 83.9  PLT 229 252 244 256   Cardiac Enzymes:  Recent Labs Lab 12/09/14 1858  TROPONINI <0.03   BNP (last 3 results)  Recent Labs  12/09/14 1858  BNP 46.7    ProBNP (last 3 results)  Recent Labs  12/21/13 1700  PROBNP 82.3    CBG:  Recent Labs Lab 12/10/14 1125 12/10/14 1712 12/10/14 2252 12/11/14 0733 12/11/14 1156  GLUCAP 95 91 122* 102* 113*    Recent Results (from the past 240 hour(s))  Clostridium Difficile by PCR     Status: None   Collection Time: 12/08/14  2:31 PM  Result Value Ref Range Status   C difficile by pcr NEGATIVE NEGATIVE Final      Studies: Ct Angio Chest Pe W/cm &/or Wo Cm  12/09/2014   CLINICAL DATA:  Shortness of breath.  Hypoxia.  EXAM: CT ANGIOGRAPHY CHEST  CT ABDOMEN AND PELVIS WITH CONTRAST  TECHNIQUE: Multidetector CT imaging of the chest was performed using the standard protocol during bolus administration of intravenous contrast. Multiplanar CT image reconstructions and MIPs were obtained to evaluate the vascular anatomy. Multidetector CT imaging of the abdomen and pelvis was performed using the standard protocol during bolus administration of intravenous contrast.  CONTRAST:  160mL OMNIPAQUE IOHEXOL 350 MG/ML SOLN  COMPARISON:  Chest x-ray dated 12/09/2014 and CT angiogram of the chest dated 05/15/2014  FINDINGS: CTA CHEST FINDINGS  The patient has pulmonary emboli and left upper lobe and in the right lower lobe. There may be clot in the right upper lobe but this is not definitive. There is bibasilar atelectasis, most prominent at the right base posterior medially adjacent to the pulmonary embolism. RA/RV ratio is 0.9.  No effusions.  CT ABDOMEN and PELVIS FINDINGS  The gallbladder and appendix have been removed. There is hepatic steatosis. No focal lesions. Biliary tree, spleen, pancreas, adrenal glands, and kidneys are normal. There is diverticulosis of the descending and sigmoid portions of the colon without active inflammation. Bladder is normal. No acute osseous abnormality. Diffuse degenerative disc disease in the thoracic and lumbar spine.  Review of the MIP images confirms the above findings.  IMPRESSION: Segmental pulmonary emboli in the left upper lobe and right lower lobe and possibly right upper lobe. Atelectasis at the right lung base and to a lesser degree at the left lung base.  No acute abnormality of the abdomen. Hepatic steatosis. Distal colonic diverticulosis.   Electronically Signed   By: Lorriane Shire M.D.   On: 12/09/2014 21:57   Ct Abdomen Pelvis W Contrast  12/09/2014   CLINICAL DATA:  Shortness of  breath.  Hypoxia.  EXAM: CT ANGIOGRAPHY CHEST  CT ABDOMEN AND PELVIS WITH CONTRAST  TECHNIQUE: Multidetector CT imaging of the chest was performed using the standard protocol during bolus administration of intravenous contrast. Multiplanar CT image reconstructions and MIPs were obtained to evaluate the vascular anatomy. Multidetector CT imaging of the abdomen and pelvis was performed using the standard protocol during bolus administration of intravenous contrast.  CONTRAST:  147mL OMNIPAQUE IOHEXOL 350 MG/ML SOLN  COMPARISON:  Chest x-ray dated 12/09/2014 and CT angiogram of the chest dated 05/15/2014  FINDINGS: CTA CHEST FINDINGS  The patient has pulmonary emboli and left upper lobe and in the right lower lobe. There may be clot in the right upper lobe but this is not definitive. There is bibasilar atelectasis, most prominent at the right base posterior medially adjacent to the pulmonary embolism. RA/RV ratio is 0.9.  No effusions.  CT ABDOMEN and PELVIS FINDINGS  The gallbladder and appendix have been removed. There is hepatic steatosis. No focal lesions. Biliary tree, spleen, pancreas, adrenal glands, and kidneys are normal. There is diverticulosis of the descending and sigmoid portions of the colon without active inflammation. Bladder is normal. No acute  osseous abnormality. Diffuse degenerative disc disease in the thoracic and lumbar spine.  Review of the MIP images confirms the above findings.  IMPRESSION: Segmental pulmonary emboli in the left upper lobe and right lower lobe and possibly right upper lobe. Atelectasis at the right lung base and to a lesser degree at the left lung base.  No acute abnormality of the abdomen. Hepatic steatosis. Distal colonic diverticulosis.   Electronically Signed   By: Lorriane Shire M.D.   On: 12/09/2014 21:57   Dg Chest Port 1 View  12/09/2014   CLINICAL DATA:  Acute onset of hypoxia. Four days status post knee surgery. Initial encounter.  EXAM: PORTABLE CHEST - 1 VIEW   COMPARISON:  Chest radiograph performed 10/26/2014  FINDINGS: The lungs are well-aerated. Bibasilar airspace opacities may reflect atelectasis or possibly mild pneumonia. Mild vascular congestion is seen. There is no evidence of pleural effusion or pneumothorax.  The cardiomediastinal silhouette is borderline normal in size. No acute osseous abnormalities are seen.  IMPRESSION: Bibasilar airspace opacities may reflect atelectasis or possibly mild pneumonia. Mild vascular congestion noted.   Electronically Signed   By: Garald Balding M.D.   On: 12/09/2014 18:06    Scheduled Meds: . clonazePAM  0.5 mg Oral QHS  . fluticasone  1 spray Each Nare Daily  . gabapentin  1,600 mg Oral QHS  . gabapentin  800 mg Oral Daily  . insulin aspart  0-5 Units Subcutaneous QHS  . insulin aspart  0-9 Units Subcutaneous TID WC  . ipratropium-albuterol  3 mL Nebulization TID  . levothyroxine  25 mcg Oral QAC breakfast  . mometasone-formoterol  2 puff Inhalation BID  . pantoprazole  40 mg Oral Daily  . Rivaroxaban  15 mg Oral BID WC  . saccharomyces boulardii  250 mg Oral BID  . sertraline  100 mg Oral Daily   Continuous Infusions:   Principal Problem:   OA (osteoarthritis) of knee Active Problems:   DM (diabetes mellitus) type 2, uncontrolled, with ketoacidosis   Obesity, Class III, BMI 40-49.9 (morbid obesity)   ANXIETY DEPRESSION   History of pulmonary embolus (PE)   OSA on CPAP   Hypothyroidism   Acute respiratory failure with hypoxia   Diarrhea    Time spent: 30 minutes    Barton Dubois  [facility] Pager [phone]. If 7PM-7AM, please contact night-coverage at [email or web address], password [facility] 12/11/2014, 3:28 PM  LOS: 5 days

## 2014-12-11 NOTE — Progress Notes (Addendum)
Subjective: 5 Days Post-Op Procedure(s) (LRB): RIGHT TOTAL KNEE ARTHROPLASTY (Right) Patient reports pain as 3 on 0-10 scale.    Objective: Vital signs in last 24 hours: Temp:  [98.2 F (36.8 C)-99.8 F (37.7 C)] 98.2 F (36.8 C) (03/26 0508) Pulse Rate:  [92-103] 96 (03/26 0508) Resp:  [18-22] 20 (03/26 0508) BP: (112-132)/(62-76) 125/69 mmHg (03/26 0508) SpO2:  [91 %-96 %] 95 % (03/26 0800)  Intake/Output from previous day: 03/25 0701 - 03/26 0700 In: 360 [P.O.:360] Out: -  Intake/Output this shift:     Recent Labs  12/09/14 0508 12/10/14 0453  HGB 10.8* 10.5*    Recent Labs  12/09/14 0508 12/10/14 0453  WBC 6.6 7.2  RBC 4.30 4.16  HCT 36.2 34.9*  PLT 244 256    Recent Labs  12/10/14 0453 12/11/14 0503  NA 139 140  K 3.6 3.6  CL 100 100  CO2 29 30  BUN 8 5*  CREATININE 0.57 0.57  GLUCOSE 90 117*  CALCIUM 8.3* 8.7   No results for input(s): LABPT, INR in the last 72 hours.  Neurologically intact Neurovascular intact Intact pulses distally Dorsiflexion/Plantar flexion intact Incision: dressing C/D/I Dressing changed. Assessment/Plan: Feeling better 5 Days Post-Op Procedure(s) (LRB): RIGHT TOTAL KNEE ARTHROPLASTY (Right) Advance diet Up with therapy if ok with medicine  Glennis Borger C 12/11/2014, 9:14 AM

## 2014-12-12 LAB — GLUCOSE, CAPILLARY
Glucose-Capillary: 117 mg/dL — ABNORMAL HIGH (ref 70–99)
Glucose-Capillary: 168 mg/dL — ABNORMAL HIGH (ref 70–99)
Glucose-Capillary: 181 mg/dL — ABNORMAL HIGH (ref 70–99)
Glucose-Capillary: 142 mg/dL — ABNORMAL HIGH (ref 70–99)

## 2014-12-12 NOTE — Progress Notes (Signed)
Pt placed on Auto CPAP 4-18 CMH20 with 2 LPM O2 bleed in via Pt home mask and tubing.  Pt tolerating well at this time, RT to monitor and assess as needed.

## 2014-12-12 NOTE — Progress Notes (Signed)
Physical Therapy Treatment Patient Details Name: Hayley [name] MRN: [id number] DOB: 09-27-1945 Today's Date: 12/12/2014    History of Present Illness R TKA    PT Comments    Pt progressing but will need SNF level therapies post acute for safe return home  Follow Up Recommendations  SNF     Equipment Recommendations  None recommended by PT    Recommendations for Other Services OT consult     Precautions / Restrictions Precautions Precautions: Knee;Fall Required Braces or Orthoses: Knee Immobilizer - Right Knee Immobilizer - Right: Discontinue once straight leg raise with < 10 degree lag Restrictions Weight Bearing Restrictions: No RLE Weight Bearing: Weight bearing as tolerated    Mobility  Bed Mobility Overal bed mobility: Needs Assistance Bed Mobility: Supine to Sit     Supine to sit: Min assist     General bed mobility comments: min A for RLE plus increased time  Transfers Overall transfer level: Needs assistance Equipment used: Rolling walker (2 wheeled) Transfers: Sit to/from Stand Sit to Stand: Min guard;Min assist         General transfer comment: cues for hand placement and control of descent  Ambulation/Gait Ambulation/Gait assistance: Min guard;Min assist Ambulation Distance (Feet): 80 Feet Assistive device: Rolling walker (2 wheeled) Gait Pattern/deviations: Step-to pattern;Step-through pattern;Trunk flexed Gait velocity: decreased   General Gait Details: cues for RW safety, position and for step length   Stairs            Wheelchair Mobility    Modified Rankin (Stroke Patients Only)       Balance                                    Cognition Arousal/Alertness: Awake/alert Behavior During Therapy: Anxious;Impulsive   Area of Impairment: Safety/judgement;Problem solving         Safety/Judgement: Decreased awareness of safety;Decreased awareness of deficits   Problem Solving: Requires verbal cues General  Comments: less confused but continues with tangential thoughts, requires frequent re-direction to stay  on task safely    Exercises Total Joint Exercises Ankle Circles/Pumps: AROM;Both;10 reps Quad Sets: AROM;Both;10 reps Heel Slides: AAROM;Right;10 reps;Seated    General Comments        Pertinent Vitals/Pain Pain Assessment: 0-10 Pain Score: 3  Pain Location: right knee Pain Descriptors / Indicators: Discomfort Pain Intervention(s): Limited activity within patient's tolerance;Monitored during session;Ice applied;Premedicated before session    Home Living                      Prior Function            PT Goals (current goals can now be found in the care plan section) Acute Rehab PT Goals Patient Stated Goal: to dance (clogging, tap dance) PT Goal Formulation: With patient Time For Goal Achievement: 12/20/14 Potential to Achieve Goals: Good Progress towards PT goals: Progressing toward goals    Frequency  7X/week    PT Plan Current plan remains appropriate    Co-evaluation             End of Session Equipment Utilized During Treatment: Gait belt;Right knee immobilizer Activity Tolerance: Patient tolerated treatment well Patient left: in chair;with call bell/phone within reach     Time: 1020-1044 PT Time Calculation (min) (ACUTE ONLY): 24 min  Charges:  $Gait Training: 8-22 mins $Therapeutic Exercise: 8-22 mins  G CodesKenyon [name] 12/12/2014, 12:54 PM

## 2014-12-12 NOTE — Progress Notes (Signed)
Subjective: 6 Days Post-Op Procedure(s) (LRB): RIGHT TOTAL KNEE ARTHROPLASTY (Right) Patient reports pain as mild.   Patient seen in rounds for Dr. Wynelle Link. Patient is well, but has had some minor complaints of pain in the knee, requiring pain medications Plan is to go Skilled nursing facility after hospital stay. Looking into [employer] for tomorrow.  Objective: Vital signs in last 24 hours: Temp:  [96.3 F (35.7 C)-99.1 F (37.3 C)] 99.1 F (37.3 C) (03/27 0615) Pulse Rate:  [93-100] 93 (03/27 0615) Resp:  [20] 20 (03/27 0615) BP: (112-137)/(65-79) 112/65 mmHg (03/27 0615) SpO2:  [92 %-95 %] 95 % (03/27 0615)  Intake/Output from previous day:  Intake/Output Summary (Last 24 hours) at 12/12/14 0758 Last data filed at 12/12/14 0615  Gross per 24 hour  Intake    720 ml  Output      0 ml  Net    720 ml     Labs:  Recent Labs  12/10/14 0453  HGB 10.5*    Recent Labs  12/10/14 0453  WBC 7.2  RBC 4.16  HCT 34.9*  PLT 256    Recent Labs  12/10/14 0453 12/11/14 0503  NA 139 140  K 3.6 3.6  CL 100 100  CO2 29 30  BUN 8 5*  CREATININE 0.57 0.57  GLUCOSE 90 117*  CALCIUM 8.3* 8.7   No results for input(s): LABPT, INR in the last 72 hours.  EXAM General - Patient is Alert, Appropriate and Oriented Extremity - Neurovascular intact Sensation intact distally Dorsiflexion/Plantar flexion intact Dressing/Incision - clean, dry, no drainage Motor Function - intact, moving foot and toes well on exam.    Past Medical History  Diagnosis Date  . Hyperthyroidism   . Toxic goiter   . Reflux   . Colon polyps   . Migraines   . 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 Obesity, Class III, BMI 40-49.9 (morbid obesity) 04/27/2010    02-17-14 reports some weight loss- intentional  . Pelvic pain     02-17-14 "states thinks its back related on left groin"  . Anemia   . Clotting disorder 02-17-14    S/p  appendectomy-developed Pulmonary emboli-tx. warfarin-d/c 1 yr ago.  . Osteoarthritis     in back(chronic pain)  . Pulmonary emboli     s/p Appendectomy '13 [facility]  . Diabetes mellitus without complication     "borderline"- oral med  . Right knee pain   . Sleep apnea     cpap - settings at 4   . Bronchitis     hx of   . Anxiety   . Depression   . Kidney stones   . Neuromuscular disorder     hands and throat"spasmodic dysphonia", tremors - thimb and forefinger   . Nausea Nov 15, 2014    cycle of migraine headaches and nausea    Assessment/Plan: 6 Days Post-Op Procedure(s) (LRB): RIGHT TOTAL KNEE ARTHROPLASTY (Right) Principal Problem:   OA (osteoarthritis) of knee Active Problems:   DM (diabetes mellitus) type 2, uncontrolled, with ketoacidosis   Obesity, Class III, BMI 40-49.9 (morbid obesity)   ANXIETY DEPRESSION   History of pulmonary embolus (PE)   OSA on CPAP   Hypothyroidism   Acute respiratory failure with hypoxia   Diarrhea  Estimated body mass index is 37.14 kg/(m^2) as calculated from the following:   Height as of this encounter: 5\' 6"  (1.676 m).   Weight as  of this encounter: 104.327 kg (230 lb). Up with therapy Plan for discharge tomorrow Discharge to SNF  DVT Prophylaxis - Xarelto Weight-Bearing as tolerated to right leg  Arlee Muslim, PA-C Orthopaedic Surgery 12/12/2014, 7:58 AM

## 2014-12-12 NOTE — Progress Notes (Addendum)
[facility] PROGRESS NOTE  Hayley [name] PPJ:[id number] DOB: April 30, 1946 DOA: 12/06/2014 PCP: Tommy Medal, MD  Assessment/Plan: 1-osteoarthritis: Status post right TKA -Post operative day #5; further treatment, evaluation and recommendations per Dr. Maureen Ralphs -will need SNF at discharge for rehab -ok to discharge from IM standpoint   2-nausea/vomiting and diarrhea: No fever, no elevation of white blood cells or acute signs of infection -Most likely secondary to her history of IBS with flares -Will continue the use of intermittent loperamide and minimize the use of Laxatives  -Will follow clinical response. -continue PRN antiemetics   3-acute on chronic respiratory failure with hypoxia: Secondary to pulmonary embolism, along with chronic obesity hypoventilation syndrome and obstructive sleep apnea -Patient has been started on full dose xarelto -Continue as needed oxygen supplementation to maintain )2 sat > 89 -CPAP daily at bedtime -Patient is breathing easier and good oxygen saturation on room air during my examination. -Please minimize the use of narcotics (less frequent dosages) -Influenza by PCR was negative; patient without any fever or signs/concerns for infection -Continue Flonase and inhalers -ok to participate with PT/OT -will need a minimum of 6 months of anticoagulation   4-diabetes mellitus type 2 -A1c 7.8 -Continue sliding scale insulin while inpatient -resume home hypoglycemic regimen at discharge -low carbohydrates diet recommended   5-hypothyroidism: Continue Synthroid  6-depression/anxiety: Continue Zoloft  7-history of migraines: Continue as needed Imitrex  Code Status: Full code Family Communication: Husband at bedside Disposition Plan: Skilled nursing facility in am. Continue xarelto full dose and PRN oxygen supplementation only if O2 sat < 89. Other symptoms stable now and from IM stand point ready for discharge. Will sign off for now. Call with any needs  or questions.   Procedures:  Status post right TKA 12/06/2014  CT and-year-old chest: Positive for PE  Antibiotics:  None  HPI/Subjective: Feeling better and breathing easier. Denies chest pain and O2 sat is 89 or above on RA  Objective: Filed Vitals:   12/12/14 0615  BP: 112/65  Pulse: 93  Temp: 99.1 F (37.3 C)  Resp: 20    Intake/Output Summary (Last 24 hours) at 12/12/14 1219 Last data filed at 12/12/14 0615  Gross per 24 hour  Intake    480 ml  Output      0 ml  Net    480 ml   Filed Weights   12/06/14 0707  Weight: 104.327 kg (230 lb)    Exam:   General:  Feeling better and breathing easier; reported knee pain is well controlled. O2 sat on RA is 89% or above. No CP  Cardiovascular: No rubs, no gallops, no murmur; S1 and S2 are pursued on exam  Respiratory: No wheezing, no crackles, scattered rhonchi bilaterally. Good oxygen saturation on room air  Abdomen: Obese, nontender, nondistended, no guarding  Musculoskeletal: Decreased range of motion and pain on her right knee (status post Right total knee arthroplasty); no cyanosis or clubbing. Trace edema bilaterally appreciated.  Data Reviewed: Basic Metabolic Panel:  Recent Labs Lab 12/07/14 0555 12/08/14 0500 12/09/14 0508 12/10/14 0453 12/11/14 0503  NA 141 142 140 139 140  K 3.9 3.9 3.3* 3.6 3.6  CL 107 106 102 100 100  CO2 24 27 29 29 30   GLUCOSE 144* 113* 103* 90 117*  BUN 10 9 8 8  5*  CREATININE 0.57 0.54 0.56 0.57 0.57  CALCIUM 8.0* 8.3* 8.5 8.3* 8.7   CBC:  Recent Labs Lab 12/07/14 0555 12/08/14 0500 12/09/14 0508 12/10/14 0453  WBC 6.3  7.2 6.6 7.2  HGB 11.6* 10.7* 10.8* 10.5*  HCT 37.9 35.5* 36.2 34.9*  MCV 84.2 83.7 84.2 83.9  PLT 229 252 244 256   Cardiac Enzymes:  Recent Labs Lab 12/09/14 1858  TROPONINI <0.03   BNP (last 3 results)  Recent Labs  12/09/14 1858  BNP 46.7    ProBNP (last 3 results)  Recent Labs  12/21/13 1700  PROBNP 82.3     CBG:  Recent Labs Lab 12/11/14 0733 12/11/14 1156 12/11/14 1721 12/11/14 2107 12/12/14 0752  GLUCAP 102* 113* 135* 137* 117*    Recent Results (from the past 240 hour(s))  Clostridium Difficile by PCR     Status: None   Collection Time: 12/08/14  2:31 PM  Result Value Ref Range Status   C difficile by pcr NEGATIVE NEGATIVE Final     Studies: No results found.  Scheduled Meds: . clonazePAM  0.5 mg Oral QHS  . fluticasone  1 spray Each Nare Daily  . gabapentin  1,600 mg Oral QHS  . gabapentin  800 mg Oral Daily  . insulin aspart  0-5 Units Subcutaneous QHS  . insulin aspart  0-9 Units Subcutaneous TID WC  . ipratropium-albuterol  3 mL Nebulization TID  . levothyroxine  25 mcg Oral QAC breakfast  . mometasone-formoterol  2 puff Inhalation BID  . pantoprazole  40 mg Oral Daily  . Rivaroxaban  15 mg Oral BID WC  . saccharomyces boulardii  250 mg Oral BID  . sertraline  100 mg Oral Daily   Continuous Infusions:   Principal Problem:   OA (osteoarthritis) of knee Active Problems:   DM (diabetes mellitus) type 2, uncontrolled, with ketoacidosis   Obesity, Class III, BMI 40-49.9 (morbid obesity)   ANXIETY DEPRESSION   History of pulmonary embolus (PE)   OSA on CPAP   Hypothyroidism   Acute respiratory failure with hypoxia   Diarrhea    Time spent: 30 minutes    Barton Dubois  [facility] Pager [phone]. If 7PM-7AM, please contact night-coverage at [email or web address], password [facility] 12/12/2014, 12:19 PM  LOS: 6 days

## 2014-12-13 DIAGNOSIS — K5900 Constipation, unspecified: Secondary | ICD-10-CM | POA: Diagnosis not present

## 2014-12-13 DIAGNOSIS — F411 Generalized anxiety disorder: Secondary | ICD-10-CM | POA: Diagnosis not present

## 2014-12-13 DIAGNOSIS — E039 Hypothyroidism, unspecified: Secondary | ICD-10-CM | POA: Diagnosis not present

## 2014-12-13 DIAGNOSIS — R1314 Dysphagia, pharyngoesophageal phase: Secondary | ICD-10-CM | POA: Diagnosis not present

## 2014-12-13 DIAGNOSIS — G43909 Migraine, unspecified, not intractable, without status migrainosus: Secondary | ICD-10-CM | POA: Diagnosis not present

## 2014-12-13 DIAGNOSIS — J383 Other diseases of vocal cords: Secondary | ICD-10-CM | POA: Diagnosis not present

## 2014-12-13 DIAGNOSIS — Z96651 Presence of right artificial knee joint: Secondary | ICD-10-CM | POA: Diagnosis not present

## 2014-12-13 DIAGNOSIS — K219 Gastro-esophageal reflux disease without esophagitis: Secondary | ICD-10-CM | POA: Diagnosis not present

## 2014-12-13 DIAGNOSIS — I2699 Other pulmonary embolism without acute cor pulmonale: Secondary | ICD-10-CM | POA: Diagnosis not present

## 2014-12-13 DIAGNOSIS — R531 Weakness: Secondary | ICD-10-CM | POA: Diagnosis not present

## 2014-12-13 DIAGNOSIS — E118 Type 2 diabetes mellitus with unspecified complications: Secondary | ICD-10-CM | POA: Diagnosis not present

## 2014-12-13 DIAGNOSIS — M1711 Unilateral primary osteoarthritis, right knee: Secondary | ICD-10-CM | POA: Diagnosis not present

## 2014-12-13 DIAGNOSIS — F341 Dysthymic disorder: Secondary | ICD-10-CM | POA: Diagnosis not present

## 2014-12-13 DIAGNOSIS — M21261 Flexion deformity, right knee: Secondary | ICD-10-CM | POA: Diagnosis not present

## 2014-12-13 DIAGNOSIS — R490 Dysphonia: Secondary | ICD-10-CM | POA: Diagnosis not present

## 2014-12-13 DIAGNOSIS — D62 Acute posthemorrhagic anemia: Secondary | ICD-10-CM | POA: Diagnosis not present

## 2014-12-13 DIAGNOSIS — D649 Anemia, unspecified: Secondary | ICD-10-CM | POA: Diagnosis not present

## 2014-12-13 DIAGNOSIS — R110 Nausea: Secondary | ICD-10-CM | POA: Diagnosis not present

## 2014-12-13 DIAGNOSIS — R2681 Unsteadiness on feet: Secondary | ICD-10-CM | POA: Diagnosis not present

## 2014-12-13 DIAGNOSIS — M150 Primary generalized (osteo)arthritis: Secondary | ICD-10-CM | POA: Diagnosis not present

## 2014-12-13 DIAGNOSIS — Z471 Aftercare following joint replacement surgery: Secondary | ICD-10-CM | POA: Diagnosis not present

## 2014-12-13 DIAGNOSIS — M25661 Stiffness of right knee, not elsewhere classified: Secondary | ICD-10-CM | POA: Diagnosis not present

## 2014-12-13 DIAGNOSIS — S83106A Unspecified dislocation of unspecified knee, initial encounter: Secondary | ICD-10-CM | POA: Diagnosis not present

## 2014-12-13 DIAGNOSIS — F329 Major depressive disorder, single episode, unspecified: Secondary | ICD-10-CM | POA: Diagnosis not present

## 2014-12-13 DIAGNOSIS — R278 Other lack of coordination: Secondary | ICD-10-CM | POA: Diagnosis not present

## 2014-12-13 DIAGNOSIS — R109 Unspecified abdominal pain: Secondary | ICD-10-CM | POA: Diagnosis not present

## 2014-12-13 DIAGNOSIS — J9610 Chronic respiratory failure, unspecified whether with hypoxia or hypercapnia: Secondary | ICD-10-CM | POA: Diagnosis not present

## 2014-12-13 DIAGNOSIS — G4730 Sleep apnea, unspecified: Secondary | ICD-10-CM | POA: Diagnosis not present

## 2014-12-13 DIAGNOSIS — F419 Anxiety disorder, unspecified: Secondary | ICD-10-CM | POA: Diagnosis not present

## 2014-12-13 LAB — CBC
HCT: 33.9 % — ABNORMAL LOW (ref 36.0–46.0)
Hemoglobin: 10.5 g/dL — ABNORMAL LOW (ref 12.0–15.0)
MCH: 25.9 pg — ABNORMAL LOW (ref 26.0–34.0)
MCHC: 31 g/dL (ref 30.0–36.0)
MCV: 83.5 fL (ref 78.0–100.0)
Platelets: 313 10*3/uL (ref 150–400)
RBC: 4.06 MIL/uL (ref 3.87–5.11)
RDW: 15.8 % — ABNORMAL HIGH (ref 11.5–15.5)
WBC: 6.6 10*3/uL (ref 4.0–10.5)

## 2014-12-13 LAB — GLUCOSE, CAPILLARY
Glucose-Capillary: 127 mg/dL — ABNORMAL HIGH (ref 70–99)
Glucose-Capillary: 171 mg/dL — ABNORMAL HIGH (ref 70–99)

## 2014-12-13 LAB — PROCALCITONIN: Procalcitonin: <0.1 ng/mL

## 2014-12-13 MED ORDER — OXYCODONE HCL 5 MG PO TABS: 5.0000 mg | ORAL_TABLET | ORAL | Status: DC | PRN

## 2014-12-13 MED ORDER — RIVAROXABAN 15 MG PO TABS: 15.0000 mg | ORAL_TABLET | Freq: Two times a day (BID) | ORAL | Status: DC

## 2014-12-13 MED ORDER — CYCLOBENZAPRINE HCL 10 MG PO TABS: 10.0000 mg | ORAL_TABLET | Freq: Two times a day (BID) | ORAL | Status: DC | PRN

## 2014-12-13 NOTE — Progress Notes (Signed)
   Subjective: 7 Days Post-Op Procedure(s) (LRB): RIGHT TOTAL KNEE ARTHROPLASTY (Right) Patient reports pain as mild.   Breathing better;no SOB Abdominal discomfort resolved. Ate regular diet yesterday Plan is to go Skilled nursing facility after hospital stay.  Objective: Vital signs in last 24 hours: Temp:  [98.1 F (36.7 C)-99 F (37.2 C)] 98.1 F (36.7 C) (03/28 0500) Pulse Rate:  [92-100] 92 (03/28 0500) Resp:  [18-20] 18 (03/28 0500) BP: (98-113)/(52-67) 98/52 mmHg (03/28 0500) SpO2:  [84 %-94 %] 90 % (03/28 0500)  Intake/Output from previous day:  Intake/Output Summary (Last 24 hours) at 12/13/14 0652 Last data filed at 12/13/14 0242  Gross per 24 hour  Intake    600 ml  Output    800 ml  Net   -200 ml    Intake/Output this shift: Total I/O In: 360 [P.O.:360] Out: 600 [Urine:600]  Labs:  Recent Labs  12/13/14 0435  HGB 10.5*    Recent Labs  12/13/14 0435  WBC 6.6  RBC 4.06  HCT 33.9*  PLT 313    Recent Labs  12/11/14 0503  NA 140  K 3.6  CL 100  CO2 30  BUN 5*  CREATININE 0.57  GLUCOSE 117*  CALCIUM 8.7   No results for input(s): LABPT, INR in the last 72 hours.  EXAM General - Patient is Alert, Appropriate and Oriented Extremity - Neurologically intact Neurovascular intact No cellulitis present Compartment soft Dressing/Incision - clean, dry, no drainage Motor Function - intact, moving foot and toes well on exam.   Past Medical History  Diagnosis Date  . Hyperthyroidism   . Toxic goiter   . Reflux   . Colon polyps   . Migraines   . 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 Obesity, Class III, BMI 40-49.9 (morbid obesity) 04/27/2010    02-17-14 reports some weight loss- intentional  . Pelvic pain     02-17-14 "states thinks its back related on left groin"  . Anemia   . Clotting disorder 02-17-14    S/p appendectomy-developed Pulmonary emboli-tx. warfarin-d/c 1 yr ago.  .  Osteoarthritis     in back(chronic pain)  . Pulmonary emboli     s/p Appendectomy '13 [facility]  . Diabetes mellitus without complication     "borderline"- oral med  . Right knee pain   . Sleep apnea     cpap - settings at 4   . Bronchitis     hx of   . Anxiety   . Depression   . Kidney stones   . Neuromuscular disorder     hands and throat"spasmodic dysphonia", tremors - thimb and forefinger   . Nausea Nov 15, 2014    cycle of migraine headaches and nausea    Assessment/Plan: 7 Days Post-Op Procedure(s) (LRB): RIGHT TOTAL KNEE ARTHROPLASTY (Right) Principal Problem:   OA (osteoarthritis) of knee Active Problems:   Pulmonary embolus   DM (diabetes mellitus) type 2, uncontrolled, with ketoacidosis   Obesity, Class III, BMI 40-49.9 (morbid obesity)   ANXIETY DEPRESSION   History of pulmonary embolus (PE)   OSA on CPAP   Hypothyroidism   Acute respiratory failure with hypoxia   Diarrhea   Discharge to SNF  DVT Prophylaxis - Xarelto Weight-Bearing as tolerated to right leg  Hayley [name] V 12/13/2014, 6:52 AM

## 2014-12-13 NOTE — Progress Notes (Signed)
Clinical Social Work Department CLINICAL SOCIAL WORK PLACEMENT NOTE 12/13/2014  Patient:  Hayley [name], Hayley [name]  Account Number:  [id number] Admit date:  12/06/2014  Clinical Social Worker:  Werner Lean, LCSW  Date/time:  12/13/2014 03:17 PM  Clinical Social Work is seeking post-discharge placement for this patient at the following level of care:   SKILLED NURSING   (*CSW will update this form in Epic as items are completed)     Patient/family provided with [place] Department of Clinical Social Work's list of facilities offering this level of care within the geographic area requested by the patient (or if unable, by the patient's family).  12/07/2014  Patient/family informed of their freedom to choose among providers that offer the needed level of care, that participate in Medicare, Medicaid or managed care program needed by the patient, have an available bed and are willing to accept the patient.    Patient/family informed of [facility]' ownership interest in [facility], as well as of the fact that they are under no obligation to receive care at this facility.  PASARR submitted to EDS on 12/06/2014 PASARR number received on 12/06/2014  FL2 transmitted to all facilities in geographic area requested by pt/family on  12/06/2014 FL2 transmitted to all facilities within larger geographic area on   Patient informed that his/her managed care company has contracts with or will negotiate with  certain facilities, including the following:     Patient/family informed of bed offers received:  12/07/2014 Patient chooses bed at [place] Physician recommends and patient chooses bed at    Patient to be transferred to [place] on  12/13/2014 Patient to be transferred to facility by [facility] Patient and family notified of transfer on 12/13/2014 Name of family member notified:  Spouse  The following physician request were entered in Epic:   Additional Comments: Pt / spouse are  in agreement with d/c to SNF today. [facility] transport required. Medical necessity form completed for [facility] transport. NSG reviewed d/c summary, scripts, avs. Scripts included in d/c packet.  Werner Lean LCSW [phone]

## 2014-12-13 NOTE — Progress Notes (Signed)
[place] for Xarelto Indication: pulmonary emboli  Allergies  Allergen Reactions  . Nutritional Supplements Anaphylaxis    Pt does not remember this happening  . Tape Rash    Rash with paper tape  . Bee Venom     unknown  . Etodolac Nausea And Vomiting  . Morphine Other (See Comments)    Hallucinations  . Propoxyphene N-Acetaminophen Nausea And Vomiting  . Penicillins Rash    Patient Measurements: Height: 5' 6"  (167.6 cm) Weight: 230 lb (104.327 kg) IBW/kg (Calculated) : 59.3   Vital Signs: Temp: 98.1 F (36.7 C) (03/28 0500) Temp Source: Axillary (03/28 0500) BP: 125/54 mmHg (03/28 0710) Pulse Rate: 92 (03/28 0500)  Labs:  Recent Labs  12/11/14 0503 12/13/14 0435  HGB  --  10.5*  HCT  --  33.9*  PLT  --  313  CREATININE 0.57  --     Estimated Creatinine Clearance: 81 mL/min (by C-G formula based on Cr of 0.57).   Medical History: Past Medical History  Diagnosis Date  . Hyperthyroidism   . Toxic goiter   . Reflux   . Colon polyps   . Migraines   . 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 Obesity, Class III, BMI 40-49.9 (morbid obesity) 04/27/2010    02-17-14 reports some weight loss- intentional  . Pelvic pain     02-17-14 "states thinks its back related on left groin"  . Anemia   . Clotting disorder 02-17-14    S/p appendectomy-developed Pulmonary emboli-tx. warfarin-d/c 1 yr ago.  . Osteoarthritis     in back(chronic pain)  . Pulmonary emboli     s/p Appendectomy '13 [facility]  . Diabetes mellitus without complication     "borderline"- oral med  . Right knee pain   . Sleep apnea     cpap - settings at 4   . Bronchitis     hx of   . Anxiety   . Depression   . Kidney stones   . Neuromuscular disorder     hands and throat"spasmodic dysphonia", tremors - thimb and forefinger   . Nausea Nov 15, 2014    cycle of migraine headaches and nausea    Medications:   Prescriptions prior to admission  Medication Sig Dispense Refill Last Dose  . albuterol (PROVENTIL HFA;VENTOLIN HFA) 108 (90 BASE) MCG/ACT inhaler Inhale 1 puff into the lungs every 6 (six) hours as needed for wheezing or shortness of breath.   12/05/2014 at Unknown time  . albuterol (PROVENTIL) (2.5 MG/3ML) 0.083% nebulizer solution Take 2.5 mg by nebulization every 6 (six) hours as needed for wheezing or shortness of breath.   More than a month at Unknown time  . benzonatate (TESSALON) 100 MG capsule Take 100 mg by mouth 3 (three) times daily as needed for cough.   12/05/2014 at pm  . cholecalciferol (VITAMIN D) 1000 UNITS tablet Take 1,000 Units by mouth daily.   Past Week at Unknown time  . clonazePAM (KLONOPIN) 0.5 MG tablet Take 0.5 mg by mouth at bedtime.   12/05/2014 at pm  . cyclobenzaprine (FLEXERIL) 10 MG tablet Take 10 mg by mouth at bedtime.    12/05/2014 at pm  . diclofenac (FLECTOR) 1.3 % PT[facility] Place 1 patch onto the skin daily as needed (for pain). Apply to back   Past Month at Unknown time  . diclofenac sodium (VOLTAREN) 1 % GEL Apply 4 g  topically daily as needed (Knee pain).   Past Month at Unknown time  . EPINEPHrine (EPIPEN) 0.3 mg/0.3 mL DEVI Inject 0.3 mg into the muscle once as needed (Allergic reaction).    unknown  . fluorometholone (FML) 0.1 % ophthalmic suspension Place 1 drop into both eyes daily as needed (Eye allergies).   0 Past Week at Unknown time  . fluticasone (FLONASE) 50 MCG/ACT nasal spray Place 1 spray into both nostrils daily as needed for allergies or rhinitis.   Past Month at Unknown time  . gabapentin (NEURONTIN) 800 MG tablet Take 800-1,600 mg by mouth 2 (two) times daily. 800 in the morning and 1600 at pm   12/06/2014 at 0430  . HYDROcodone-acetaminophen (NORCO/VICODIN) 5-325 MG per tablet Take two tablets by mouth every four hours for pain (Patient not taking: Reported on 11/30/2014) 360 tablet 0 Past Week at Unknown time  . ketorolac (TORADOL) 10 MG tablet  Take 10 mg by mouth every 6 (six) hours as needed (for headache).   Past Month at Unknown time  . levothyroxine (SYNTHROID, LEVOTHROID) 25 MCG tablet Take 25 mcg by mouth every morning.    12/06/2014 at 0430  . mometasone-formoterol (DULERA) 100-5 MCG/ACT AERO Inhale 2 puffs into the lungs 2 (two) times daily.   12/06/2014 at 0430  . nystatin (MYCOSTATIN) 100000 UNIT/ML suspension Take 5 mLs by mouth daily as needed (Yeast).   More than a month at Unknown time  . ondansetron (ZOFRAN) 4 MG tablet Take 4 mg by mouth every 8 (eight) hours as needed for nausea or vomiting.   12/06/2014 at 0430  . ondansetron (ZOFRAN-ODT) 8 MG disintegrating tablet Take 8 mg by mouth every 8 (eight) hours as needed for nausea or vomiting.   12/06/2014 at 0430  . oxyCODONE-acetaminophen (PERCOCET) 10-325 MG per tablet Take 0.5 tablets by mouth 2 (two) times daily as needed for pain.   12/05/2014 at am  . pantoprazole (PROTONIX) 40 MG tablet Take 40 mg by mouth daily.   12/06/2014 at 0430  . Phenazopyridine HCl (AZO TABS PO) Take 1 tablet by mouth daily as needed (UTI).   More than a month at Unknown time  . primidone (MYSOLINE) 50 MG tablet Take 50 mg by mouth at bedtime.   05/14/2014 at Unknown time  . Probiotic Product (PROBIOTIC DAILY PO) Take 1 capsule by mouth daily.   12/05/2014 at pm  . promethazine (PHENERGAN) 25 MG tablet Take 25 mg by mouth every 6 (six) hours as needed for nausea or vomiting.   More than a month at Unknown time  . rizatriptan (MAXALT) 10 MG tablet Take 10 mg by mouth as needed for migraine (migraine). May repeat in 2 hours if needed   12/05/2014 at pm  . sertraline (ZOLOFT) 100 MG tablet Take 100 mg by mouth daily.   12/06/2014 at 0430  . sitaGLIPtin (JANUVIA) 100 MG tablet Take 100 mg by mouth every evening.    Past Week at Unknown time  . SUMAtriptan (IMITREX) 6 MG/0.5ML SOLN injection Inject 6 mg into the skin every 2 (two) hours as needed for migraine or headache. May repeat in 2 hours if headache  persists or recurs.   Past Week at Unknown time  . valACYclovir (VALTREX) 1000 MG tablet Take 2,000 mg by mouth 2 (two) times daily as needed (Fever blisters).   2 Past Month at Unknown time   Scheduled:  . clonazePAM  0.5 mg Oral QHS  . fluticasone  1 spray Each Nare Daily  .  gabapentin  1,600 mg Oral QHS  . gabapentin  800 mg Oral Daily  . insulin aspart  0-5 Units Subcutaneous QHS  . insulin aspart  0-9 Units Subcutaneous TID WC  . ipratropium-albuterol  3 mL Nebulization TID  . levothyroxine  25 mcg Oral QAC breakfast  . mometasone-formoterol  2 puff Inhalation BID  . pantoprazole  40 mg Oral Daily  . Rivaroxaban  15 mg Oral BID WC  . saccharomyces boulardii  250 mg Oral BID  . sertraline  100 mg Oral Daily   Infusions:    Assessment: 36 yoF s/p right total knee 12/06/14, was placed on Xarelto 40m daily for DVT prophylaxis. Has PMH of PE.  On 12/09/14 patient developed hypoxia and CT angio demonstrated multiple segmental pulmonary emboli.  Orders were received to increase Xarelto to achieve full anticoagulation and for pharmacy to assist with dosing.   Goal of Therapy:  Full Anticoagulation   Today, 12/13/2014: D#4 Xarelto 15 mg PO BID.   POD #7 following R TKA. Hgb slightly low but stable post-op.  Pltc WNL SCr WNL, stable on 3/26.  Estimated CrCl well above 30 mL/min threshold No bleeding reported in chart note.   Plan:  1. Continue Xarelto 15 mg PO BID with meals x 21 days, then on 12/31/14 change to 20 mg PO daily with food. 2. Per medical consult recommendations, patient should remain on anticoagulation for at least six months.   Reviewed new dosage regimen with patient, provided updated Xarelto Discharge Instructions, provided Xarelto DVT/PE Treatment kit, pointed out the dosing diary (completed for doses taken through this AM) and the voucher card that should cover first 30-d outpatient prescription at no cost to patient.   All patient's questions regarding Xarelto were  answered.   RClayburn Pert PharmD, BCPS Pager: [phone]/28/2016  9:31 AM

## 2014-12-16 ENCOUNTER — Non-Acute Institutional Stay (SKILLED_NURSING_FACILITY): Payer: Medicare Other | Admitting: Internal Medicine

## 2014-12-16 DIAGNOSIS — J9610 Chronic respiratory failure, unspecified whether with hypoxia or hypercapnia: Secondary | ICD-10-CM | POA: Diagnosis not present

## 2014-12-16 DIAGNOSIS — E039 Hypothyroidism, unspecified: Secondary | ICD-10-CM

## 2014-12-16 DIAGNOSIS — J383 Other diseases of vocal cords: Secondary | ICD-10-CM

## 2014-12-16 DIAGNOSIS — K219 Gastro-esophageal reflux disease without esophagitis: Secondary | ICD-10-CM | POA: Diagnosis not present

## 2014-12-16 DIAGNOSIS — R110 Nausea: Secondary | ICD-10-CM

## 2014-12-16 DIAGNOSIS — F419 Anxiety disorder, unspecified: Secondary | ICD-10-CM

## 2014-12-16 DIAGNOSIS — K5900 Constipation, unspecified: Secondary | ICD-10-CM

## 2014-12-16 DIAGNOSIS — D62 Acute posthemorrhagic anemia: Secondary | ICD-10-CM

## 2014-12-16 DIAGNOSIS — F329 Major depressive disorder, single episode, unspecified: Secondary | ICD-10-CM | POA: Diagnosis not present

## 2014-12-16 DIAGNOSIS — M1711 Unilateral primary osteoarthritis, right knee: Secondary | ICD-10-CM

## 2014-12-16 DIAGNOSIS — G43909 Migraine, unspecified, not intractable, without status migrainosus: Secondary | ICD-10-CM | POA: Diagnosis not present

## 2014-12-16 DIAGNOSIS — F32A Depression, unspecified: Secondary | ICD-10-CM

## 2014-12-16 NOTE — Progress Notes (Signed)
Patient ID: Hayley [name], female   DOB: 08/10/1946, 69 y.o.   MRN: [id number]     [facility]   PCP: Tommy Medal, MD  Code Status: full code  Allergies  Allergen Reactions  . Nutritional Supplements Anaphylaxis    Pt does not remember this happening  . Tape Rash    Rash with paper tape  . Bee Venom     unknown  . Etodolac Nausea And Vomiting  . Morphine Other (See Comments)    Hallucinations  . Propoxyphene N-Acetaminophen Nausea And Vomiting  . Penicillins Rash    Chief Complaint  Patient presents with  . New Admit To SNF     HPI:  69 year old patient is here for short term rehabilitation post hospital admission from 12/06/14-12/13/14 with right knee OA. She underwent right total knee arthroplasty.  She has PMH of migraine headache, PE, asthma, OSA, gerd, hypothyroidism, neuropathy among others. She is seen in her room today. Her pain is under control. She has been nauseous for a week now on and off. This is interfering with her participation in therapy. She also has chronic headache for a month now and is following with neurology. She mentions taking hydrocodone-apap 10-325 mg half a tablet at home and this did not cause her nausea. Dilaudid and oxyIR are new medications for her. With her being in pain in her right leg, she was started on lidocaine patch. On continuous o2 here, mentions being on o2 only at bedtime at home  Review of Systems:  Constitutional: Positive for fatigue. Negative for fever, chills,diaphoresis.  HENT: Negative for headache, congestion Eyes: Negative for eye pain, discharge or double vision.  Respiratory: Negative for cough, shortness of breath and wheezing.   Cardiovascular: Negative for chest pain, palpitations, leg swelling.  Gastrointestinal: Negative for heartburn, vomiting, abdominal pain but has some abdominal cramps. Last bowel movement 2 days back Genitourinary: Negative for dysuria Musculoskeletal:  Negative for back pain, falls Skin: Negative for itching, rash. left arm bruising noted Neurological: Negative for dizziness, tingling, focal weakness Psychiatric/Behavioral: Negative for depression   Past Medical History  Diagnosis Date  . Hyperthyroidism   . Toxic goiter   . Reflux   . Colon polyps   . Migraines   . 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 Obesity, Class III, BMI 40-49.9 (morbid obesity) 04/27/2010    02-17-14 reports some weight loss- intentional  . Pelvic pain     02-17-14 "states thinks its back related on left groin"  . Anemia   . Clotting disorder 02-17-14    S/p appendectomy-developed Pulmonary emboli-tx. warfarin-d/c 1 yr ago.  . Osteoarthritis     in back(chronic pain)  . Pulmonary emboli     s/p Appendectomy '13 [facility]  . Diabetes mellitus without complication     "borderline"- oral med  . Right knee pain   . Sleep apnea     cpap - settings at 4   . Bronchitis     hx of   . Anxiety   . Depression   . Kidney stones   . Neuromuscular disorder     hands and throat"spasmodic dysphonia", tremors - thimb and forefinger   . Nausea Nov 15, 2014    cycle of migraine headaches and nausea   Past Surgical History  Procedure Laterality Date  . Abdominal hysterectomy    . Zenkers diverticulum    . Kidney stone surgery    .  Laparoscopic appendectomy  11/27/2011    Procedure: APPENDECTOMY LAPAROSCOPIC;  Surgeon: Adin Hector, MD;  Location: [facility];  Service: General;  Laterality: N/A;  . Cholecystectomy    . Knee arthroscopy  1998    right  . Hand surgery  2004    both hands  . Botox injection      for migranes  . Cataract extraction, bilateral Bilateral 03/2013   . Vocal cord surgery  02-17-14    01-12-14 done in [place] -[facility](s/p selective denervation/reinnervation recurrent laryngeal nerve surgery)  . Appendectomy      3'13 [facility] s/p developed  Pulmonary Emboli  . Total knee revision Left 02/24/2014     Procedure: LEFT TOTAL KNEE REVISION;  Surgeon: Gearlean Alf, MD;  Location: [facility];  Service: Orthopedics;  Laterality: Left;  . Total knee arthroplasty Right 12/06/2014    Procedure: RIGHT TOTAL KNEE ARTHROPLASTY;  Surgeon: Gaynelle Arabian, MD;  Location: [facility];  Service: Orthopedics;  Laterality: Right;   Social History:   reports that she quit smoking about 32 years ago. She has never used smokeless tobacco. She reports that she does not drink alcohol or use illicit drugs.  Family History  Problem Relation Age of Onset  . Heart disease Mother     Medications: Patient's Medications  New Prescriptions   No medications on file  Previous Medications   ALBUTEROL (PROVENTIL HFA;VENTOLIN HFA) 108 (90 BASE) MCG/ACT INHALER    Inhale 1 puff into the lungs every 6 (six) hours as needed for wheezing or shortness of breath.   ALBUTEROL (PROVENTIL) (2.5 MG/3ML) 0.083% NEBULIZER SOLUTION    Take 2.5 mg by nebulization every 6 (six) hours as needed for wheezing or shortness of breath.   BENZONATATE (TESSALON) 100 MG CAPSULE    Take 100 mg by mouth 3 (three) times daily as needed for cough.   CLONAZEPAM (KLONOPIN) 0.5 MG TABLET    Take 0.5 mg by mouth at bedtime.   CYCLOBENZAPRINE (FLEXERIL) 10 MG TABLET    Take 10 mg by mouth at bedtime.    CYCLOBENZAPRINE (FLEXERIL) 10 MG TABLET    Take 1 tablet (10 mg total) by mouth every 12 (twelve) hours as needed for muscle spasms.   EPINEPHRINE (EPIPEN) 0.3 MG/0.3 ML DEVI    Inject 0.3 mg into the muscle once as needed (Allergic reaction).    FLUOROMETHOLONE (FML) 0.1 % OPHTHALMIC SUSPENSION    Place 1 drop into both eyes daily as needed (Eye allergies).    FLUTICASONE (FLONASE) 50 MCG/ACT NASAL SPRAY    Place 1 spray into both nostrils daily as needed for allergies or rhinitis.   GABAPENTIN (NEURONTIN) 800 MG TABLET    Take 800-1,600 mg by mouth 2 (two) times daily. 800 in the morning and 1600 at pm   LEVOTHYROXINE (SYNTHROID, LEVOTHROID) 25 MCG TABLET     Take 25 mcg by mouth every morning.    METHOCARBAMOL (ROBAXIN) 500 MG TABLET    Take 1 tablet (500 mg total) by mouth every 6 (six) hours as needed for muscle spasms.   MOMETASONE-FORMOTEROL (DULERA) 100-5 MCG/ACT AERO    Inhale 2 puffs into the lungs 2 (two) times daily.   NYSTATIN (MYCOSTATIN) 100000 UNIT/ML SUSPENSION    Take 5 mLs by mouth daily as needed (Yeast).   ONDANSETRON (ZOFRAN) 4 MG TABLET    Take 4 mg by mouth every 8 (eight) hours as needed for nausea or vomiting.   ONDANSETRON (ZOFRAN-ODT) 8 MG DISINTEGRATING TABLET  Take 8 mg by mouth every 8 (eight) hours as needed for nausea or vomiting.   OXYCODONE (OXY IR/ROXICODONE) 5 MG IMMEDIATE RELEASE TABLET    Take 1-2 tablets (5-10 mg total) by mouth every 3 (three) hours as needed for moderate pain or severe pain.   OXYCODONE (OXY IR/ROXICODONE) 5 MG IMMEDIATE RELEASE TABLET    Take 1 tablet (5 mg total) by mouth every 4 (four) hours as needed for severe pain.   PANTOPRAZOLE (PROTONIX) 40 MG TABLET    Take 40 mg by mouth daily.   PHENAZOPYRIDINE HCL (AZO TABS PO)    Take 1 tablet by mouth daily as needed (UTI).   PRIMIDONE (MYSOLINE) 50 MG TABLET    Take 50 mg by mouth at bedtime.   PROMETHAZINE (PHENERGAN) 25 MG TABLET    Take 25 mg by mouth every 6 (six) hours as needed for nausea or vomiting.   RIVAROXABAN (XARELTO) 15 MG TABS TABLET    Take 1 tablet (15 mg total) by mouth 2 (two) times daily with a meal.   RIZATRIPTAN (MAXALT) 10 MG TABLET    Take 10 mg by mouth as needed for migraine (migraine). May repeat in 2 hours if needed   SERTRALINE (ZOLOFT) 100 MG TABLET    Take 100 mg by mouth daily.   SITAGLIPTIN (JANUVIA) 100 MG TABLET    Take 100 mg by mouth every evening.    SUMATRIPTAN (IMITREX) 6 MG/0.5ML SOLN INJECTION    Inject 6 mg into the skin every 2 (two) hours as needed for migraine or headache. May repeat in 2 hours if headache persists or recurs.   VALACYCLOVIR (VALTREX) 1000 MG TABLET    Take 2,000 mg by mouth 2 (two)  times daily as needed (Fever blisters).   Modified Medications   No medications on file  Discontinued Medications   No medications on file     Physical Exam: Filed Vitals:   12/16/14 1152  BP: 120/70  Pulse: 88  Temp: 98.7 F (37.1 C)  Resp: 16  SpO2: 96%    General- elderly female, obese, in no acute distress Head- normocephalic, atraumatic Nose- no maxillary or frontal sinus tenderness, no nasal discharge Throat- moist mucus membrane Eyes- PERRLA, EOMI, no pallor, no icterus, no discharge, normal conjunctiva, normal sclera Neck- no cervical lymphadenopathy Cardiovascular- normal s1,s2, no murmurs, palpable dorsalis pedis, trace leg edema Respiratory- bilateral clear to auscultation, no wheeze, no rhonchi, no crackles, no use of accessory muscles, on o2 Abdomen- bowel sounds present, soft, non tender Musculoskeletal- able to move all 4 extremities, right leg ROM restricted at right knee  Neurological- no focal deficit Skin- warm and dry, right knee has aquacel dressing, clean and dry, right thigh has lidocaine patch Psychiatry- alert and oriented to person, place and time, normal mood and affect    Labs reviewed: Basic Metabolic Panel:  Recent Labs  12/22/13 0342  12/09/14 0508 12/10/14 0453 12/11/14 0503  NA 140  < > 140 139 140  K 4.2  < > 3.3* 3.6 3.6  CL 101  < > 102 100 100  CO2 26  < > 29 29 30   GLUCOSE 284*  < > 103* 90 117*  BUN 9  < > 8 8 5*  CREATININE 0.60  < > 0.56 0.57 0.57  CALCIUM 9.2  < > 8.5 8.3* 8.7  MG 2.0  --   --   --   --   PHOS 2.5  --   --   --   --   < > =  values in this interval not displayed. Liver Function Tests:  Recent Labs  12/22/13 0342 02/17/14 1255  AST 16 22  ALT 14 14  ALKPHOS 80 78  BILITOT <0.2* 0.3  PROT 7.0 7.2  ALBUMIN 3.4* 3.5   No results for input(s): LIPASE, AMYLASE in the last 8760 hours. No results for input(s): AMMONIA in the last 8760 hours. CBC:  Recent Labs  12/21/13 1700  05/15/14 1829   12/09/14 0508 12/10/14 0453 12/13/14 0435  WBC 7.8  < > 6.3  < > 6.6 7.2 6.6  NEUTROABS 5.0  --  3.8  --   --   --   --   HGB 12.8  < > 11.7*  < > 10.8* 10.5* 10.5*  HCT 40.9  < > 38.6  < > 36.2 34.9* 33.9*  MCV 89.3  < > 80.2  < > 84.2 83.9 83.5  PLT 282  < > 310  < > 244 256 313  < > = values in this interval not displayed. Cardiac Enzymes:  Recent Labs  12/26/13 0626 12/26/13 0902 12/09/14 1858  TROPONINI <0.30 <0.30 <0.03   BNP: Invalid input(s): POCBNP CBG:  Recent Labs  12/12/14 2108 12/13/14 0722 12/13/14 1155  GLUCAP 142* 127* 171*     Assessment/Plan  Right knee OA S/p right TKA. Continue xarelto for dvt prophylaxis, has f/u with orthopedics. On dilaudid 2 mg q6h prn pain, oxyIR 5 mg 1-2 tab q3h prn pain and robaxin for muscle spasm. Change robaxin from 500 q6h scheduled to 500 q8h prn and monitor. D/c dilaudid and oxyIR. Start Hydrocodone-apap 5-325 1-2 tab q4 hr prn pain and reassess. 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 Have made adjsutment to pain meds as above,   Chronic respiratory failure From asthma and OSA. On dulera and proventil, change o2 to only at bedtime for now and use o2 during daytime only for o2 sat < 88 with exertion and 90 with rest. To use cpap at bedtime  Chronic migraine headache Continue prn sumitriptan and rizatriptan  anixety Continue klonopin at bedtime  Neuropathy Continue gabapentin 800 mg in am and 1600 mg in pm  Hypothyroidism Continue levothyroxine 25 mcg daily  gerd On protonix 40 mg daily, continue this  DM Monitor cbg, continue januvia 100 mg daily  Depression Continue zoloft 100 mg daily   Goals of care: short term rehabilitation   Labs/tests  ordered: cbc, cmp pending  Family/ staff Communication: reviewed care plan with patient and nursing supervisor    Blanchie Serve, MD  [facility] [phone] (Monday-Friday 8 am - 5 pm) [phone] (afterhours)

## 2014-12-17 ENCOUNTER — Other Ambulatory Visit: Payer: Self-pay | Admitting: *Deleted

## 2014-12-17 MED ORDER — HYDROCODONE-ACETAMINOPHEN 5-325 MG PO TABS: ORAL_TABLET | ORAL | Status: DC

## 2014-12-17 NOTE — Telephone Encounter (Signed)
[facility] 

## 2014-12-19 ENCOUNTER — Other Ambulatory Visit: Admit: 2014-12-19 | Disposition: A | Discharge status: Home / Self Care | Payer: Self-pay

## 2014-12-19 LAB — URINALYSIS, COMPLETE
Bacteria: NONE SEEN
Hyaline Cast: 3
RBC,UR: 4 /HPF (ref 0–5)
Specific Gravity: 1.025 (ref 1.003–1.030)
Squamous Epithelial: 1
WBC UR: 32 /HPF (ref 0–5)

## 2014-12-21 DIAGNOSIS — Z471 Aftercare following joint replacement surgery: Secondary | ICD-10-CM | POA: Diagnosis not present

## 2014-12-21 DIAGNOSIS — Z96651 Presence of right artificial knee joint: Secondary | ICD-10-CM | POA: Diagnosis not present

## 2014-12-22 DIAGNOSIS — M150 Primary generalized (osteo)arthritis: Secondary | ICD-10-CM | POA: Diagnosis not present

## 2014-12-22 DIAGNOSIS — E119 Type 2 diabetes mellitus without complications: Secondary | ICD-10-CM | POA: Diagnosis not present

## 2014-12-22 DIAGNOSIS — J9610 Chronic respiratory failure, unspecified whether with hypoxia or hypercapnia: Secondary | ICD-10-CM | POA: Diagnosis not present

## 2014-12-22 DIAGNOSIS — Z471 Aftercare following joint replacement surgery: Secondary | ICD-10-CM | POA: Diagnosis not present

## 2014-12-22 DIAGNOSIS — G609 Hereditary and idiopathic neuropathy, unspecified: Secondary | ICD-10-CM | POA: Diagnosis not present

## 2014-12-22 DIAGNOSIS — F329 Major depressive disorder, single episode, unspecified: Secondary | ICD-10-CM | POA: Diagnosis not present

## 2014-12-24 DIAGNOSIS — J9610 Chronic respiratory failure, unspecified whether with hypoxia or hypercapnia: Secondary | ICD-10-CM | POA: Diagnosis not present

## 2014-12-24 DIAGNOSIS — E119 Type 2 diabetes mellitus without complications: Secondary | ICD-10-CM | POA: Diagnosis not present

## 2014-12-24 DIAGNOSIS — Z471 Aftercare following joint replacement surgery: Secondary | ICD-10-CM | POA: Diagnosis not present

## 2014-12-24 DIAGNOSIS — M150 Primary generalized (osteo)arthritis: Secondary | ICD-10-CM | POA: Diagnosis not present

## 2014-12-24 DIAGNOSIS — F329 Major depressive disorder, single episode, unspecified: Secondary | ICD-10-CM | POA: Diagnosis not present

## 2014-12-24 DIAGNOSIS — G609 Hereditary and idiopathic neuropathy, unspecified: Secondary | ICD-10-CM | POA: Diagnosis not present

## 2014-12-28 DIAGNOSIS — Z471 Aftercare following joint replacement surgery: Secondary | ICD-10-CM | POA: Diagnosis not present

## 2014-12-28 DIAGNOSIS — M150 Primary generalized (osteo)arthritis: Secondary | ICD-10-CM | POA: Diagnosis not present

## 2014-12-28 DIAGNOSIS — F329 Major depressive disorder, single episode, unspecified: Secondary | ICD-10-CM | POA: Diagnosis not present

## 2014-12-28 DIAGNOSIS — E119 Type 2 diabetes mellitus without complications: Secondary | ICD-10-CM | POA: Diagnosis not present

## 2014-12-28 DIAGNOSIS — J9610 Chronic respiratory failure, unspecified whether with hypoxia or hypercapnia: Secondary | ICD-10-CM | POA: Diagnosis not present

## 2014-12-28 DIAGNOSIS — G609 Hereditary and idiopathic neuropathy, unspecified: Secondary | ICD-10-CM | POA: Diagnosis not present

## 2014-12-29 DIAGNOSIS — F329 Major depressive disorder, single episode, unspecified: Secondary | ICD-10-CM | POA: Diagnosis not present

## 2014-12-29 DIAGNOSIS — M150 Primary generalized (osteo)arthritis: Secondary | ICD-10-CM | POA: Diagnosis not present

## 2014-12-29 DIAGNOSIS — G609 Hereditary and idiopathic neuropathy, unspecified: Secondary | ICD-10-CM | POA: Diagnosis not present

## 2014-12-29 DIAGNOSIS — J9610 Chronic respiratory failure, unspecified whether with hypoxia or hypercapnia: Secondary | ICD-10-CM | POA: Diagnosis not present

## 2014-12-29 DIAGNOSIS — E119 Type 2 diabetes mellitus without complications: Secondary | ICD-10-CM | POA: Diagnosis not present

## 2014-12-29 DIAGNOSIS — Z471 Aftercare following joint replacement surgery: Secondary | ICD-10-CM | POA: Diagnosis not present

## 2014-12-31 DIAGNOSIS — F329 Major depressive disorder, single episode, unspecified: Secondary | ICD-10-CM | POA: Diagnosis not present

## 2014-12-31 DIAGNOSIS — G609 Hereditary and idiopathic neuropathy, unspecified: Secondary | ICD-10-CM | POA: Diagnosis not present

## 2014-12-31 DIAGNOSIS — E119 Type 2 diabetes mellitus without complications: Secondary | ICD-10-CM | POA: Diagnosis not present

## 2014-12-31 DIAGNOSIS — M150 Primary generalized (osteo)arthritis: Secondary | ICD-10-CM | POA: Diagnosis not present

## 2014-12-31 DIAGNOSIS — Z471 Aftercare following joint replacement surgery: Secondary | ICD-10-CM | POA: Diagnosis not present

## 2014-12-31 DIAGNOSIS — J9610 Chronic respiratory failure, unspecified whether with hypoxia or hypercapnia: Secondary | ICD-10-CM | POA: Diagnosis not present

## 2015-01-06 DIAGNOSIS — R2689 Other abnormalities of gait and mobility: Secondary | ICD-10-CM | POA: Diagnosis not present

## 2015-01-06 DIAGNOSIS — Z471 Aftercare following joint replacement surgery: Secondary | ICD-10-CM | POA: Diagnosis not present

## 2015-01-06 DIAGNOSIS — Z96651 Presence of right artificial knee joint: Secondary | ICD-10-CM | POA: Diagnosis not present

## 2015-01-10 DIAGNOSIS — R2689 Other abnormalities of gait and mobility: Secondary | ICD-10-CM | POA: Diagnosis not present

## 2015-01-10 DIAGNOSIS — M9901 Segmental and somatic dysfunction of cervical region: Secondary | ICD-10-CM | POA: Diagnosis not present

## 2015-01-10 DIAGNOSIS — Z96651 Presence of right artificial knee joint: Secondary | ICD-10-CM | POA: Diagnosis not present

## 2015-01-10 DIAGNOSIS — M5412 Radiculopathy, cervical region: Secondary | ICD-10-CM | POA: Diagnosis not present

## 2015-01-10 DIAGNOSIS — Z471 Aftercare following joint replacement surgery: Secondary | ICD-10-CM | POA: Diagnosis not present

## 2015-01-18 DIAGNOSIS — Z96651 Presence of right artificial knee joint: Secondary | ICD-10-CM | POA: Diagnosis not present

## 2015-01-18 DIAGNOSIS — I2699 Other pulmonary embolism without acute cor pulmonale: Secondary | ICD-10-CM | POA: Diagnosis not present

## 2015-01-18 DIAGNOSIS — Z471 Aftercare following joint replacement surgery: Secondary | ICD-10-CM | POA: Diagnosis not present

## 2015-01-20 DIAGNOSIS — Z96651 Presence of right artificial knee joint: Secondary | ICD-10-CM | POA: Diagnosis not present

## 2015-01-20 DIAGNOSIS — Z471 Aftercare following joint replacement surgery: Secondary | ICD-10-CM | POA: Diagnosis not present

## 2015-01-20 DIAGNOSIS — R2689 Other abnormalities of gait and mobility: Secondary | ICD-10-CM | POA: Diagnosis not present

## 2015-01-25 ENCOUNTER — Other Ambulatory Visit: Payer: Self-pay | Admitting: Adult Health

## 2015-02-10 DIAGNOSIS — K219 Gastro-esophageal reflux disease without esophagitis: Secondary | ICD-10-CM | POA: Diagnosis not present

## 2015-02-10 DIAGNOSIS — R1013 Epigastric pain: Secondary | ICD-10-CM | POA: Diagnosis not present

## 2015-02-10 DIAGNOSIS — K5900 Constipation, unspecified: Secondary | ICD-10-CM | POA: Diagnosis not present

## 2015-03-16 DIAGNOSIS — M47812 Spondylosis without myelopathy or radiculopathy, cervical region: Secondary | ICD-10-CM | POA: Diagnosis not present

## 2015-03-16 DIAGNOSIS — M65341 Trigger finger, right ring finger: Secondary | ICD-10-CM | POA: Diagnosis not present

## 2015-03-16 DIAGNOSIS — M65342 Trigger finger, left ring finger: Secondary | ICD-10-CM | POA: Diagnosis not present

## 2015-03-16 DIAGNOSIS — M1812 Unilateral primary osteoarthritis of first carpometacarpal joint, left hand: Secondary | ICD-10-CM | POA: Diagnosis not present

## 2015-03-16 DIAGNOSIS — M67311 Transient synovitis, right shoulder: Secondary | ICD-10-CM | POA: Diagnosis not present

## 2015-03-16 DIAGNOSIS — M67312 Transient synovitis, left shoulder: Secondary | ICD-10-CM | POA: Diagnosis not present

## 2015-03-16 DIAGNOSIS — M1811 Unilateral primary osteoarthritis of first carpometacarpal joint, right hand: Secondary | ICD-10-CM | POA: Diagnosis not present

## 2015-03-24 DIAGNOSIS — R350 Frequency of micturition: Secondary | ICD-10-CM | POA: Diagnosis not present

## 2015-03-24 DIAGNOSIS — N390 Urinary tract infection, site not specified: Secondary | ICD-10-CM | POA: Diagnosis not present

## 2015-03-28 DIAGNOSIS — M4150 Other secondary scoliosis, site unspecified: Secondary | ICD-10-CM | POA: Diagnosis not present

## 2015-03-28 DIAGNOSIS — M5442 Lumbago with sciatica, left side: Secondary | ICD-10-CM | POA: Diagnosis not present

## 2015-04-05 DIAGNOSIS — M545 Low back pain: Secondary | ICD-10-CM | POA: Diagnosis not present

## 2015-04-05 DIAGNOSIS — G8929 Other chronic pain: Secondary | ICD-10-CM | POA: Diagnosis not present

## 2015-04-08 DIAGNOSIS — M4150 Other secondary scoliosis, site unspecified: Secondary | ICD-10-CM | POA: Diagnosis not present

## 2015-04-08 DIAGNOSIS — M5442 Lumbago with sciatica, left side: Secondary | ICD-10-CM | POA: Diagnosis not present

## 2015-04-15 DIAGNOSIS — L237 Allergic contact dermatitis due to plants, except food: Secondary | ICD-10-CM | POA: Diagnosis not present

## 2015-04-26 ENCOUNTER — Ambulatory Visit (INDEPENDENT_AMBULATORY_CARE_PROVIDER_SITE_OTHER): Payer: Medicare Other | Admitting: Neurology

## 2015-04-26 ENCOUNTER — Ambulatory Visit: Payer: Medicare Other | Admitting: Neurology

## 2015-04-26 ENCOUNTER — Encounter: Payer: Self-pay | Admitting: Neurology

## 2015-04-26 VITALS — BP 116/79 | HR 90 | Ht 66.0 in | Wt 207.0 lb

## 2015-04-26 DIAGNOSIS — G250 Essential tremor: Secondary | ICD-10-CM

## 2015-04-26 DIAGNOSIS — M542 Cervicalgia: Secondary | ICD-10-CM | POA: Diagnosis not present

## 2015-04-26 MED ORDER — PROPRANOLOL HCL 40 MG PO TABS: 40.0000 mg | ORAL_TABLET | Freq: Two times a day (BID) | ORAL | Status: DC

## 2015-04-26 MED ORDER — SUMATRIPTAN SUCCINATE 25 MG PO TABS: 25.0000 mg | ORAL_TABLET | ORAL | Status: DC | PRN

## 2015-04-26 MED ORDER — SUMATRIPTAN SUCCINATE REFILL 4 MG/0.5ML ~~LOC~~ SOCT: 4.0000 mg | SUBCUTANEOUS | Status: DC | PRN

## 2015-04-26 NOTE — Progress Notes (Signed)
PATIENT: Hayley [name] DOB: Jan 08, 1946  Chief Complaint  Patient presents with  . Tremors    Reports falling onto her hand and shoulder while trying to get on a riding lawnmower in early June 2016.  Since this accident, she has been having neck and shoulder pain.  Says the pain is radiating down her arm bilaterally and she has tremors in both hands.  . Neck Pain  . Shoulder Pain     HISTORICAL  PATTIJO [name] is a 69 years old right-handed female, seen in refer by hand surgeon Dr. Daryll Brod, and her primary care PA Flora Lipps for evaluation of neck pain, radiating pain to bilateral shoulder, upper extremity  She has past medical history of spasmodic dysphonia, status post surgical correction, also had a history of hypertension, diabetes, DVT, PE following surgical procedure, most recent was right knee replacement in March 2016, is taking anticoagulation pradaxa, chronic low back pain, previous history of epidural injection for low back pain.  She fell from long more in June 2016, landed on her back, she was able to got up, finished mowing her lawn, later that day, she noticed worsening low back pain, significant neck pain, radiating pain to her bilateral shoulder, upper extremity, hands, she also noticed bilateral hands paresthesia, burning sensation, subjective weakness, she had worsening urinary urgency, wear pads occasionally, mild unbalanced gait, mild constipation  She was evaluated by [facility] orthopedic surgeon Dr. Rolena Infante for her worsening low back pain, reported MRI of lumbar spine at [place] in July 2016, was told she is not a surgical candidate, she does has multilevel lumbar degenerative disc disease.  She is also concerned about her bilateral hands tremor, getting worse since 2015, most noticeable when she writes, holding utensils, there was no similar family history  REVIEW OF SYSTEMS: Full 14 system review of systems performed and notable only for weight loss,  fatigue, ringing ears, trouble swallowing, itching, easy bruising, joint pain, allergies, memory loss, headaches, numbness, weakness, difficulty swallowing, tremor, incontinence  ALLERGIES: Allergies  Allergen Reactions  . Nutritional Supplements Anaphylaxis    Pt does not remember this happening  . Tape Rash    Rash with paper tape  . Bee Venom     unknown  . Etodolac Nausea And Vomiting  . Morphine Other (See Comments)    Hallucinations  . Propoxyphene   . Propoxyphene N-Acetaminophen Nausea And Vomiting  . Penicillins Rash    HOME MEDICATIONS: Current Outpatient Prescriptions  Medication Sig Dispense Refill  . albuterol (PROVENTIL HFA;VENTOLIN HFA) 108 (90 BASE) MCG/ACT inhaler Inhale 1 puff into the lungs every 6 (six) hours as needed for wheezing or shortness of breath.    [name] albuterol (PROVENTIL) (2.5 MG/3ML) 0.083% nebulizer solution Take 2.5 mg by nebulization every 6 (six) hours as needed for wheezing or shortness of breath.    . benzonatate (TESSALON) 100 MG capsule Take 100 mg by mouth 3 (three) times daily as needed for cough.    . clonazePAM (KLONOPIN) 0.5 MG tablet Take 0.5 mg by mouth at bedtime.    . cyclobenzaprine (FLEXERIL) 10 MG tablet Take 10 mg by mouth at bedtime.     [name] EPINEPHrine (EPIPEN) 0.3 mg/0.3 mL DEVI Inject 0.3 mg into the muscle once as needed (Allergic reaction).     . fluorometholone (FML) 0.1 % ophthalmic suspension Place 1 drop into both eyes daily as needed (Eye allergies).   0  . fluticasone (FLONASE) 50 MCG/ACT nasal spray Place 1 spray  into both nostrils daily as needed for allergies or rhinitis.    [name] gabapentin (NEURONTIN) 800 MG tablet Take 800-1,600 mg by mouth 2 (two) times daily. 800 in the morning and 1600 at pm    . HYDROcodone-acetaminophen (NORCO/VICODIN) 5-325 MG per tablet Take one to two tablets by mouth every 4 hours as needed for pain. Do not exceed 4gm of Tylenol in 24 hours 360 tablet 0  . levothyroxine (SYNTHROID, LEVOTHROID) 25  MCG tablet Take 25 mcg by mouth every morning.     . methocarbamol (ROBAXIN) 500 MG tablet Take 1 tablet (500 mg total) by mouth every 6 (six) hours as needed for muscle spasms. 90 tablet 0  . mometasone-formoterol (DULERA) 100-5 MCG/ACT AERO Inhale 2 puffs into the lungs 2 (two) times daily.    [name] nystatin (MYCOSTATIN) 100000 UNIT/ML suspension Take 5 mLs by mouth daily as needed (Yeast).    . ondansetron (ZOFRAN) 4 MG tablet Take 4 mg by mouth every 8 (eight) hours as needed for nausea or vomiting.    . ondansetron (ZOFRAN-ODT) 8 MG disintegrating tablet Take 8 mg by mouth every 8 (eight) hours as needed for nausea or vomiting.    [name] oxyCODONE (OXY IR/ROXICODONE) 5 MG immediate release tablet Take 1-2 tablets (5-10 mg total) by mouth every 3 (three) hours as needed for moderate pain or severe pain. 90 tablet 0  . pantoprazole (PROTONIX) 40 MG tablet Take 40 mg by mouth daily.    . Phenazopyridine HCl (AZO TABS PO) Take 1 tablet by mouth daily as needed (UTI).    . primidone (MYSOLINE) 50 MG tablet Take 50 mg by mouth at bedtime.    . promethazine (PHENERGAN) 25 MG tablet Take 25 mg by mouth every 6 (six) hours as needed for nausea or vomiting.    . Rivaroxaban (XARELTO) 15 MG TABS tablet Take 1 tablet (15 mg total) by mouth 2 (two) times daily with a meal. 60 tablet 5  . rizatriptan (MAXALT) 10 MG tablet Take 10 mg by mouth as needed for migraine (migraine). May repeat in 2 hours if needed    . sertraline (ZOLOFT) 100 MG tablet Take 100 mg by mouth daily.    . sitaGLIPtin (JANUVIA) 100 MG tablet Take 100 mg by mouth every evening.     . SUMAtriptan (IMITREX) 6 MG/0.5ML SOLN injection Inject 6 mg into the skin every 2 (two) hours as needed for migraine or headache. May repeat in 2 hours if headache persists or recurs.    . valACYclovir (VALTREX) 1000 MG tablet Take 2,000 mg by mouth 2 (two) times daily as needed (Fever blisters).   2     PAST MEDICAL HISTORY: Past Medical History  Diagnosis Date   . Hyperthyroidism   . Toxic goiter   . Reflux   . Colon polyps   . Migraines   . 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 Obesity, Class III, BMI 40-49.9 (morbid obesity) 04/27/2010    02-17-14 reports some weight loss- intentional  . Pelvic pain     02-17-14 "states thinks its back related on left groin"  . Anemia   . Clotting disorder 02-17-14    S/p appendectomy-developed Pulmonary emboli-tx. warfarin-d/c 1 yr ago.  . Osteoarthritis     in back(chronic pain)  . Pulmonary emboli     s/p Appendectomy '13 [facility]  . Diabetes mellitus without complication     "borderline"- oral med  . Right  knee pain   . Sleep apnea     cpap - settings at 4   . Bronchitis     hx of   . Anxiety   . Depression   . Kidney stones   . Neuromuscular disorder     hands and throat"spasmodic dysphonia", tremors - thimb and forefinger   . Nausea Nov 15, 2014    cycle of migraine headaches and nausea    PAST SURGICAL HISTORY: Past Surgical History  Procedure Laterality Date  . Abdominal hysterectomy    . Zenkers diverticulum    . Kidney stone surgery    . Laparoscopic appendectomy  11/27/2011    Procedure: APPENDECTOMY LAPAROSCOPIC;  Surgeon: Adin Hector, MD;  Location: [facility];  Service: General;  Laterality: N/A;  . Cholecystectomy    . Knee arthroscopy  1998    right  . Hand surgery  2004    both hands  . Botox injection      for migranes  . Cataract extraction, bilateral Bilateral 03/2013   . Vocal cord surgery  02-17-14    01-12-14 done in [place] -[facility](s/p selective denervation/reinnervation recurrent laryngeal nerve surgery)  . Appendectomy      3'13 [facility] s/p developed  Pulmonary Emboli  . Total knee revision Left 02/24/2014    Procedure: LEFT TOTAL KNEE REVISION;  Surgeon: Gearlean Alf, MD;  Location: [facility];  Service: Orthopedics;  Laterality: Left;  . Total knee arthroplasty Right 12/06/2014    Procedure: RIGHT TOTAL KNEE ARTHROPLASTY;   Surgeon: Gaynelle Arabian, MD;  Location: [facility];  Service: Orthopedics;  Laterality: Right;    FAMILY HISTORY: Family History  Problem Relation Age of Onset  . Heart disease Mother   . Pneumonia Father   . Cirrhosis Father   . Hypertension Father   . Heart attack Paternal Grandfather   . Heart attack Maternal Grandfather   . Heart attack Paternal Grandmother   . Heart attack Maternal Grandmother   . Alcoholism Father     SOCIAL HISTORY:  History   Social History  . Marital Status: Married    Spouse Name: N/A  . Number of Children: 5  . Years of Education: 16   Occupational History  . Retired    Social History Main Topics  . Smoking status: Former Smoker    Quit date: 01/03/1982  . Smokeless tobacco: Never Used  . Alcohol Use: No  . Drug Use: No  . Sexual Activity: Not on file   Other Topics Concern  . Not on file   Social History Narrative   Lives at home with husband.   Right-handed.   2 cups caffeine per day.     PHYSICAL EXAM   Filed Vitals:   04/26/15 0732  BP: 116/79  Pulse: 90  Height: 5\' 6"  (1.676 m)  Weight: 207 lb (93.895 kg)    Not recorded      Body mass index is 33.43 kg/(m^2).  PHYSICAL EXAMNIATION:  Gen: NAD, conversant, well nourised, obese, well groomed                     Cardiovascular: Regular rate rhythm, no peripheral edema, warm, nontender. Eyes: Conjunctivae clear without exudates or hemorrhage Neck: Supple, no carotid bruise. Pulmonary: Clear to auscultation bilaterally   NEUROLOGICAL EXAM:  MENTAL STATUS: Speech:    Mild dysphonic speech; fluent and spontaneous with normal comprehension.  Cognition:     Orientation to time, place and person  Normal recent and remote memory     Normal Attention span and concentration     Normal Language, naming, repeating,spontaneous speech     Fund of knowledge   CRANIAL NERVES: CN II: Visual fields are full to confrontation. Fundoscopic exam is normal with sharp discs and  no vascular changes. Pupils are round equal and briskly reactive to light. CN III, IV, VI: extraocular movement are normal. No ptosis. CN V: Facial sensation is intact to pinprick in all 3 divisions bilaterally. Corneal responses are intact.  CN VII: Face is symmetric with normal eye closure and smile. CN VIII: Hearing is normal to rubbing fingers CN IX, X: Palate elevates symmetrically. Phonation is normal. CN XI: Head turning and shoulder shrug are intact CN XII: Tongue is midline with normal movements and no atrophy.  MOTOR: Mild bilateral hands postural tremor, normal muscle tone, bulk, strength   REFLEXES: Reflexes are 2+ and symmetric at the biceps, triceps, knees, and ankles. Plantar responses are flexor.  SENSORY: Intact to light touch, pinprick, position sense, and vibration sense are intact in fingers and toes.  COORDINATION: Rapid alternating movements and fine finger movements are intact. There is no dysmetria on finger-to-nose and heel-knee-shin.    GAIT/STANCE: Posture is normal. Gait is cautious, mildly unsteady  DIAGNOSTIC DATA (LABS, IMAGING, TESTING) - I reviewed patient records, labs, notes, testing and imaging myself where available.   ASSESSMENT AND PLAN  MARESSA APOLLO is a 69 y.o. female   Neck pain, radiating pain to bilateral shoulder, upper extremity following her fall in June 2016,  Suggestive of bilateral cervical radiculopathy  Proceed with MRI of cervical spine  Keep current dose of gabapentin 800 mg 1 in the morning, 2 at night time, Robaxin as needed Essential Tremor:  Tried primidone in the past without significant improvement, primidone has the potential interaction with her polypharmacy  Will try low dose propanolol 40 mg twice a day Chronic anticoagulation:  History of DVT, PE, following surgical procedure, most recent one was March 2016 following her right knee replacement  On chronic pradaxa treatment  Return to clinic in 2 weeks  Marcial Pacas, M.D. Ph.D.  [facility] [address] [place], [place] [address] Ph: [phone] Fax: [phone]  CC: To Dr. Daryll Brod, Flora Lipps

## 2015-05-03 DIAGNOSIS — L570 Actinic keratosis: Secondary | ICD-10-CM | POA: Diagnosis not present

## 2015-05-03 DIAGNOSIS — C4490 Unspecified malignant neoplasm of skin, unspecified: Secondary | ICD-10-CM | POA: Diagnosis not present

## 2015-05-03 DIAGNOSIS — L821 Other seborrheic keratosis: Secondary | ICD-10-CM | POA: Diagnosis not present

## 2015-05-03 DIAGNOSIS — L290 Pruritus ani: Secondary | ICD-10-CM | POA: Diagnosis not present

## 2015-05-04 ENCOUNTER — Ambulatory Visit
Admission: RE | Admit: 2015-05-04 | Discharge: 2015-05-04 | Disposition: A | Discharge status: Home / Self Care | Payer: Medicare Other | Source: Ambulatory Visit | Attending: Neurology | Admitting: Neurology

## 2015-05-04 DIAGNOSIS — M542 Cervicalgia: Secondary | ICD-10-CM | POA: Diagnosis not present

## 2015-05-04 DIAGNOSIS — G250 Essential tremor: Secondary | ICD-10-CM

## 2015-05-04 DIAGNOSIS — M5022 Other cervical disc displacement, mid-cervical region: Secondary | ICD-10-CM | POA: Diagnosis not present

## 2015-05-04 DIAGNOSIS — M5021 Other cervical disc displacement,  high cervical region: Secondary | ICD-10-CM | POA: Diagnosis not present

## 2015-05-05 ENCOUNTER — Ambulatory Visit: Payer: Medicare Other | Admitting: Neurology

## 2015-05-05 ENCOUNTER — Telehealth: Payer: Self-pay | Admitting: *Deleted

## 2015-05-05 ENCOUNTER — Telehealth: Payer: Self-pay | Admitting: Neurology

## 2015-05-05 NOTE — Telephone Encounter (Signed)
No showed follow up appointment. 

## 2015-05-05 NOTE — Telephone Encounter (Signed)
Michelle: Please call patient, MRI of cervical spine showed evidence of multilevel degenerative disc disease, most severe at C4-5, C5-6, 3 and 4, with moderate to severe bilateral foraminal stenosis, which likely explaining her complains of neck pain radiating pain to her shoulders will review film at her follow-up visit,  She now showed 05/05/2015.  Abnormal MRI cervical spine (without) demonstrating: 1. At C3-4: disc bulging and uncovertebral joint hypertrophy and facet hypertrophy with severe left foraminal stenosis 2. At C4-5: disc bulging and uncovertebral joint hypertrophy with severe biforaminal stenosis 3. At C5-6: disc bulging and uncovertebral joint hypertrophy with moderate right and severe left foraminal stenosis 4. At C6-7: disc bulging and uncovertebral joint hypertrophy with moderate biforaminal stenosis 5. No intrinsic or compressive spinal cord lesions.

## 2015-05-05 NOTE — Telephone Encounter (Signed)
Spoke to patient - she is aware of results and an appt has been scheduled for further discussion.

## 2015-05-06 ENCOUNTER — Encounter: Payer: Self-pay | Admitting: Neurology

## 2015-05-06 ENCOUNTER — Other Ambulatory Visit: Payer: Self-pay | Admitting: Adult Health

## 2015-05-11 ENCOUNTER — Encounter: Payer: Self-pay | Admitting: Neurology

## 2015-05-11 ENCOUNTER — Ambulatory Visit (INDEPENDENT_AMBULATORY_CARE_PROVIDER_SITE_OTHER): Payer: Medicare Other | Admitting: Neurology

## 2015-05-11 VITALS — BP 112/72 | HR 63 | Ht 66.0 in | Wt 212.0 lb

## 2015-05-11 DIAGNOSIS — G250 Essential tremor: Secondary | ICD-10-CM

## 2015-05-11 DIAGNOSIS — M542 Cervicalgia: Secondary | ICD-10-CM

## 2015-05-11 DIAGNOSIS — M5030 Other cervical disc degeneration, unspecified cervical region: Secondary | ICD-10-CM

## 2015-05-11 NOTE — Patient Instructions (Signed)
Continue follow-up with pain management Hot compression Neck stretching exercise Physical therapy referral Continue current medications

## 2015-05-11 NOTE — Progress Notes (Signed)
Chief Complaint  Patient presents with  . Neck Pain    She is here with her husband, Arnell Sieving, to discuss her MRI findings.      PATIENT: Hayley [name] DOB: [birth year]  Chief Complaint  Patient presents with  . Neck Pain    She is here with her husband, Arnell Sieving, to discuss her MRI findings.     HISTORICAL  Hayley [name] is a 69 years old right-handed female, seen in refer by hand surgeon Dr. Daryll Brod, and her primary care PA Flora Lipps for evaluation of neck pain, radiating pain to bilateral shoulder, upper extremity  She has past medical history of spasmodic dysphonia, status post surgical correction, also had a history of hypertension, diabetes, DVT, PE following surgical procedure, most recent was right knee replacement in March 2016, is taking anticoagulation pradaxa, chronic low back pain, previous history of epidural injection for low back pain.  She fell from law mower in June 2016, landed on her back, she was able to got up, finished mowing her lawn, later that day, she noticed worsening low back pain, significant neck pain, radiating pain to her bilateral shoulder, upper extremity, hands, she also noticed bilateral hands paresthesia, burning sensation, subjective weakness, she had worsening urinary urgency, wear pads occasionally, mild unbalanced gait, mild constipation  She was evaluated by [facility] orthopedic surgeon Dr. Rolena Infante for her worsening low back pain, reported MRI of lumbar spine at [facility] in July 2016, was told she is not a surgical candidate, she does has multilevel lumbar degenerative disc disease.  She is also concerned about her bilateral hands tremor, getting worse since 2015, most noticeable when she writes, holding utensils, there was no similar family history  UPDATE August 24th 2016: She continues to complain bilateral neck pain, radiating pain to bilateral shoulder, arm, We have reviewed MRI cervical spine (without) demonstrating: 1. At C3-4:  disc bulging and uncovertebral joint hypertrophy and facet hypertrophy with severe left foraminal stenosis 2. At C4-5: disc bulging and uncovertebral joint hypertrophy with severe biforaminal stenosis 3. At C5-6: disc bulging and uncovertebral joint hypertrophy with moderate right and severe left foraminal stenosis 4. At C6-7: disc bulging and uncovertebral joint hypertrophy with moderate biforaminal stenosis 5. No intrinsic or compressive spinal cord lesions  Propanolol 40 mg twice a day has helped her tremor, she denies significant side effect.  REVIEW OF SYSTEMS: Full 14 system review of systems performed and notable only for weight loss, fatigue, ringing ears, trouble swallowing, itching, easy bruising, joint pain, allergies, memory loss, headaches, numbness, weakness, difficulty swallowing, tremor, incontinence  ALLERGIES: Allergies  Allergen Reactions  . Nutritional Supplements Anaphylaxis    Pt does not remember this happening  . Tape Rash    Rash with paper tape  . Bee Venom     unknown  . Etodolac Nausea And Vomiting  . Morphine Other (See Comments)    Hallucinations  . Other Itching and Swelling  . Propoxyphene   . Propoxyphene N-Acetaminophen Nausea And Vomiting  . Penicillins Rash    HOME MEDICATIONS: Current Outpatient Prescriptions  Medication Sig Dispense Refill  . albuterol (PROVENTIL HFA;VENTOLIN HFA) 108 (90 BASE) MCG/ACT inhaler Inhale 1 puff into the lungs every 6 (six) hours as needed for wheezing or shortness of breath.    [name] albuterol (PROVENTIL) (2.5 MG/3ML) 0.083% nebulizer solution Take 2.5 mg by nebulization every 6 (six) hours as needed for wheezing or shortness of breath.    . benzonatate (TESSALON) 100 MG capsule Take 100  mg by mouth 3 (three) times daily as needed for cough.    . clonazePAM (KLONOPIN) 0.5 MG tablet Take 0.5 mg by mouth at bedtime.    . cyclobenzaprine (FLEXERIL) 10 MG tablet Take 10 mg by mouth at bedtime.     [name] EPINEPHrine (EPIPEN)  0.3 mg/0.3 mL DEVI Inject 0.3 mg into the muscle once as needed (Allergic reaction).     . fluorometholone (FML) 0.1 % ophthalmic suspension Place 1 drop into both eyes daily as needed (Eye allergies).   0  . fluticasone (FLONASE) 50 MCG/ACT nasal spray Place 1 spray into both nostrils daily as needed for allergies or rhinitis.    [name] gabapentin (NEURONTIN) 800 MG tablet Take 800-1,600 mg by mouth 2 (two) times daily. 800 in the morning and 1600 at pm    . HYDROcodone-acetaminophen (NORCO/VICODIN) 5-325 MG per tablet Take one to two tablets by mouth every 4 hours as needed for pain. Do not exceed 4gm of Tylenol in 24 hours 360 tablet 0  . levothyroxine (SYNTHROID, LEVOTHROID) 25 MCG tablet Take 25 mcg by mouth every morning.     . methocarbamol (ROBAXIN) 500 MG tablet Take 1 tablet (500 mg total) by mouth every 6 (six) hours as needed for muscle spasms. 90 tablet 0  . mometasone-formoterol (DULERA) 100-5 MCG/ACT AERO Inhale 2 puffs into the lungs 2 (two) times daily.    [name] nystatin (MYCOSTATIN) 100000 UNIT/ML suspension Take 5 mLs by mouth daily as needed (Yeast).    . ondansetron (ZOFRAN) 4 MG tablet Take 4 mg by mouth every 8 (eight) hours as needed for nausea or vomiting.    . ondansetron (ZOFRAN-ODT) 8 MG disintegrating tablet Take 8 mg by mouth every 8 (eight) hours as needed for nausea or vomiting.    [name] oxyCODONE (OXY IR/ROXICODONE) 5 MG immediate release tablet Take 1-2 tablets (5-10 mg total) by mouth every 3 (three) hours as needed for moderate pain or severe pain. 90 tablet 0  . pantoprazole (PROTONIX) 40 MG tablet Take 40 mg by mouth daily.    . Phenazopyridine HCl (AZO TABS PO) Take 1 tablet by mouth daily as needed (UTI).    . primidone (MYSOLINE) 50 MG tablet Take 50 mg by mouth at bedtime.    . promethazine (PHENERGAN) 25 MG tablet Take 25 mg by mouth every 6 (six) hours as needed for nausea or vomiting.    . Rivaroxaban (XARELTO) 15 MG TABS tablet Take 1 tablet (15 mg total) by mouth 2  (two) times daily with a meal. 60 tablet 5  . rizatriptan (MAXALT) 10 MG tablet Take 10 mg by mouth as needed for migraine (migraine). May repeat in 2 hours if needed    . sertraline (ZOLOFT) 100 MG tablet Take 100 mg by mouth daily.    . sitaGLIPtin (JANUVIA) 100 MG tablet Take 100 mg by mouth every evening.     . SUMAtriptan (IMITREX) 6 MG/0.5ML SOLN injection Inject 6 mg into the skin every 2 (two) hours as needed for migraine or headache. May repeat in 2 hours if headache persists or recurs.    . valACYclovir (VALTREX) 1000 MG tablet Take 2,000 mg by mouth 2 (two) times daily as needed (Fever blisters).   2     PAST MEDICAL HISTORY: Past Medical History  Diagnosis Date  . Hyperthyroidism   . Toxic goiter   . Reflux   . Colon polyps   . Migraines   . Disorder of vocal cords  spasmotic dysphonia ,02-17-14 has" whispery voice-low tone"  . Obesity, Class III, BMI 40-49.9 (morbid obesity) 04/27/2010    02-17-14 reports some weight loss- intentional  . Pelvic pain     02-17-14 "states thinks its back related on left groin"  . Anemia   . Clotting disorder 02-17-14    S/p appendectomy-developed Pulmonary emboli-tx. warfarin-d/c 1 yr ago.  . Osteoarthritis     in back(chronic pain)  . Pulmonary emboli     s/p Appendectomy '13 [facility]  . Diabetes mellitus without complication     "borderline"- oral med  . Right knee pain   . Sleep apnea     cpap - settings at 4   . Bronchitis     hx of   . Anxiety   . Depression   . Kidney stones   . Neuromuscular disorder     hands and throat"spasmodic dysphonia", tremors - thimb and forefinger   . Nausea Nov 15, 2014    cycle of migraine headaches and nausea    PAST SURGICAL HISTORY: Past Surgical History  Procedure Laterality Date  . Abdominal hysterectomy    . Zenkers diverticulum    . Kidney stone surgery    . Laparoscopic appendectomy  11/27/2011    Procedure: APPENDECTOMY LAPAROSCOPIC;  Surgeon: Adin Hector, MD;  Location: [facility];   Service: General;  Laterality: N/A;  . Cholecystectomy    . Knee arthroscopy  1998    right  . Hand surgery  2004    both hands  . Botox injection      for migranes  . Cataract extraction, bilateral Bilateral 03/2013   . Vocal cord surgery  02-17-14    01-12-14 done in [place] [facility](s/p selective denervation/reinnervation recurrent laryngeal nerve surgery)  . Appendectomy      3'13 [facility] s/p developed  Pulmonary Emboli  . Total knee revision Left 02/24/2014    Procedure: LEFT TOTAL KNEE REVISION;  Surgeon: Gearlean Alf, MD;  Location: [facility];  Service: Orthopedics;  Laterality: Left;  . Total knee arthroplasty Right 12/06/2014    Procedure: RIGHT TOTAL KNEE ARTHROPLASTY;  Surgeon: Gaynelle Arabian, MD;  Location: [facility];  Service: Orthopedics;  Laterality: Right;    FAMILY HISTORY: Family History  Problem Relation Age of Onset  . Heart disease Mother   . Pneumonia Father   . Cirrhosis Father   . Hypertension Father   . Heart attack Paternal Grandfather   . Heart attack Maternal Grandfather   . Heart attack Paternal Grandmother   . Heart attack Maternal Grandmother   . Alcoholism Father     SOCIAL HISTORY:  Social History   Social History  . Marital Status: Married    Spouse Name: N/A  . Number of Children: 5  . Years of Education: 16   Occupational History  . Retired    Social History Main Topics  . Smoking status: Former Smoker    Quit date: 01/03/1982  . Smokeless tobacco: Never Used  . Alcohol Use: No  . Drug Use: No  . Sexual Activity: Not on file   Other Topics Concern  . Not on file   Social History Narrative   Lives at home with husband.   Right-handed.   2 cups caffeine per day.     PHYSICAL EXAM   Filed Vitals:   05/11/15 1202  BP: 112/72  Pulse: 63  Height: 5\' 6"  (1.676 m)  Weight: 212 lb (96.163 kg)    Not recorded  Body mass index is 34.23 kg/(m^2).  PHYSICAL EXAMNIATION:  Gen: NAD, conversant, well nourised, obese, well  groomed                     Cardiovascular: Regular rate rhythm, no peripheral edema, warm, nontender. Eyes: Conjunctivae clear without exudates or hemorrhage Neck: Supple, no carotid bruise. Pulmonary: Clear to auscultation bilaterally   NEUROLOGICAL EXAM:  MENTAL STATUS: Speech:    Mild dysphonic speech; fluent and spontaneous with normal comprehension.  Cognition:     Orientation to time, place and person     Normal recent and remote memory     Normal Attention span and concentration     Normal Language, naming, repeating,spontaneous speech     Fund of knowledge   CRANIAL NERVES: CN II: Visual fields are full to confrontation. Fundoscopic exam is normal with sharp discs and no vascular changes. Pupils are round equal and briskly reactive to light. CN III, IV, VI: extraocular movement are normal. No ptosis. CN V: Facial sensation is intact to pinprick in all 3 divisions bilaterally. Corneal responses are intact.  CN VII: Face is symmetric with normal eye closure and smile. CN VIII: Hearing is normal to rubbing fingers CN IX, X: Palate elevates symmetrically. Phonation is normal. CN XI: Head turning and shoulder shrug are intact CN XII: Tongue is midline with normal movements and no atrophy.  MOTOR: Mild bilateral hands postural tremor, normal muscle tone, bulk, strength   REFLEXES: Reflexes are 2+ and symmetric at the biceps, triceps, knees, and ankles. Plantar responses are flexor.  SENSORY: Intact to light touch, pinprick, position sense, and vibration sense are intact in fingers and toes.  COORDINATION: Rapid alternating movements and fine finger movements are intact. There is no dysmetria on finger-to-nose and heel-knee-shin.    GAIT/STANCE: Posture is normal. Gait is cautious, mildly unsteady  DIAGNOSTIC DATA (LABS, IMAGING, TESTING) - I reviewed patient records, labs, notes, testing and imaging myself where available.   ASSESSMENT AND PLAN  Hayley [name] is a  69 y.o. female   Left cervical radiculopathy:  We have reviewed MRI cervical spine: Multilevel degenerative disc disease, severe foraminal stenosis at left C3-4, bilateral C4-5, left C5-6,:  Keep current dose of gabapentin 800 mg 1 in the morning, 2 at night time, Robaxin as needed Essential Tremor:  Tried primidone in the past without significant improvement, primidone has the potential interaction with her polypharmacy  Keep low dose propanolol 40 mg twice a day  Chronic anticoagulation:  History of DVT, PE, following surgical procedure, most recent one was March 2016 following her right knee replacement  On chronic pradaxa treatment  Return to clinic in 2 weeks  Marcial Pacas, M.D. Ph.D.  [facility] [address], [place], [place] [address] Ph: [phone] Fax: [phone]  CC: To Dr. Daryll Brod, Flora Lipps

## 2015-05-18 DIAGNOSIS — G894 Chronic pain syndrome: Secondary | ICD-10-CM | POA: Diagnosis not present

## 2015-05-18 DIAGNOSIS — G43909 Migraine, unspecified, not intractable, without status migrainosus: Secondary | ICD-10-CM | POA: Diagnosis not present

## 2015-05-18 DIAGNOSIS — M1288 Other specific arthropathies, not elsewhere classified, other specified site: Secondary | ICD-10-CM | POA: Diagnosis not present

## 2015-05-18 DIAGNOSIS — E119 Type 2 diabetes mellitus without complications: Secondary | ICD-10-CM | POA: Diagnosis not present

## 2015-05-18 DIAGNOSIS — Z86711 Personal history of pulmonary embolism: Secondary | ICD-10-CM | POA: Diagnosis not present

## 2015-05-18 DIAGNOSIS — F419 Anxiety disorder, unspecified: Secondary | ICD-10-CM | POA: Diagnosis not present

## 2015-05-18 DIAGNOSIS — G4733 Obstructive sleep apnea (adult) (pediatric): Secondary | ICD-10-CM | POA: Diagnosis not present

## 2015-05-18 DIAGNOSIS — F329 Major depressive disorder, single episode, unspecified: Secondary | ICD-10-CM | POA: Diagnosis not present

## 2015-05-18 DIAGNOSIS — Z96653 Presence of artificial knee joint, bilateral: Secondary | ICD-10-CM | POA: Diagnosis not present

## 2015-05-18 DIAGNOSIS — M533 Sacrococcygeal disorders, not elsewhere classified: Secondary | ICD-10-CM | POA: Diagnosis not present

## 2015-05-18 DIAGNOSIS — Z880 Allergy status to penicillin: Secondary | ICD-10-CM | POA: Diagnosis not present

## 2015-05-18 DIAGNOSIS — Z91030 Bee allergy status: Secondary | ICD-10-CM | POA: Diagnosis not present

## 2015-05-18 DIAGNOSIS — Z6841 Body Mass Index (BMI) 40.0 and over, adult: Secondary | ICD-10-CM | POA: Diagnosis not present

## 2015-05-18 DIAGNOSIS — E079 Disorder of thyroid, unspecified: Secondary | ICD-10-CM | POA: Diagnosis not present

## 2015-05-18 DIAGNOSIS — Z885 Allergy status to narcotic agent status: Secondary | ICD-10-CM | POA: Diagnosis not present

## 2015-05-18 DIAGNOSIS — Z886 Allergy status to analgesic agent status: Secondary | ICD-10-CM | POA: Diagnosis not present

## 2015-05-18 DIAGNOSIS — M5388 Other specified dorsopathies, sacral and sacrococcygeal region: Secondary | ICD-10-CM | POA: Diagnosis not present

## 2015-05-25 DIAGNOSIS — R490 Dysphonia: Secondary | ICD-10-CM | POA: Diagnosis not present

## 2015-05-25 DIAGNOSIS — R498 Other voice and resonance disorders: Secondary | ICD-10-CM | POA: Diagnosis not present

## 2015-05-25 DIAGNOSIS — R251 Tremor, unspecified: Secondary | ICD-10-CM | POA: Diagnosis not present

## 2015-06-03 ENCOUNTER — Inpatient Hospital Stay (HOSPITAL_COMMUNITY)
Admission: EM | Admit: 2015-06-03 | Discharge: 2015-06-07 | DRG: 202 | Disposition: A | Discharge status: Home / Self Care | Payer: Medicare Other | Attending: Internal Medicine | Admitting: Internal Medicine

## 2015-06-03 ENCOUNTER — Emergency Department (HOSPITAL_COMMUNITY): Payer: Medicare Other

## 2015-06-03 ENCOUNTER — Encounter (HOSPITAL_COMMUNITY): Payer: Self-pay

## 2015-06-03 DIAGNOSIS — I959 Hypotension, unspecified: Secondary | ICD-10-CM | POA: Diagnosis present

## 2015-06-03 DIAGNOSIS — Z91030 Bee allergy status: Secondary | ICD-10-CM | POA: Diagnosis not present

## 2015-06-03 DIAGNOSIS — R251 Tremor, unspecified: Secondary | ICD-10-CM | POA: Diagnosis present

## 2015-06-03 DIAGNOSIS — Z9989 Dependence on other enabling machines and devices: Secondary | ICD-10-CM

## 2015-06-03 DIAGNOSIS — J441 Chronic obstructive pulmonary disease with (acute) exacerbation: Secondary | ICD-10-CM | POA: Diagnosis not present

## 2015-06-03 DIAGNOSIS — Z6833 Body mass index (BMI) 33.0-33.9, adult: Secondary | ICD-10-CM | POA: Diagnosis not present

## 2015-06-03 DIAGNOSIS — J9601 Acute respiratory failure with hypoxia: Secondary | ICD-10-CM | POA: Diagnosis present

## 2015-06-03 DIAGNOSIS — G4733 Obstructive sleep apnea (adult) (pediatric): Secondary | ICD-10-CM | POA: Diagnosis not present

## 2015-06-03 DIAGNOSIS — Z96651 Presence of right artificial knee joint: Secondary | ICD-10-CM | POA: Diagnosis present

## 2015-06-03 DIAGNOSIS — K219 Gastro-esophageal reflux disease without esophagitis: Secondary | ICD-10-CM | POA: Diagnosis present

## 2015-06-03 DIAGNOSIS — E0590 Thyrotoxicosis, unspecified without thyrotoxic crisis or storm: Secondary | ICD-10-CM | POA: Diagnosis present

## 2015-06-03 DIAGNOSIS — Z79899 Other long term (current) drug therapy: Secondary | ICD-10-CM

## 2015-06-03 DIAGNOSIS — G4730 Sleep apnea, unspecified: Secondary | ICD-10-CM | POA: Diagnosis present

## 2015-06-03 DIAGNOSIS — R531 Weakness: Secondary | ICD-10-CM | POA: Diagnosis not present

## 2015-06-03 DIAGNOSIS — I10 Essential (primary) hypertension: Secondary | ICD-10-CM | POA: Diagnosis present

## 2015-06-03 DIAGNOSIS — Z885 Allergy status to narcotic agent status: Secondary | ICD-10-CM

## 2015-06-03 DIAGNOSIS — Z888 Allergy status to other drugs, medicaments and biological substances status: Secondary | ICD-10-CM | POA: Diagnosis not present

## 2015-06-03 DIAGNOSIS — Z87442 Personal history of urinary calculi: Secondary | ICD-10-CM | POA: Diagnosis not present

## 2015-06-03 DIAGNOSIS — Z880 Allergy status to penicillin: Secondary | ICD-10-CM

## 2015-06-03 DIAGNOSIS — R490 Dysphonia: Secondary | ICD-10-CM | POA: Diagnosis present

## 2015-06-03 DIAGNOSIS — M549 Dorsalgia, unspecified: Secondary | ICD-10-CM | POA: Diagnosis present

## 2015-06-03 DIAGNOSIS — Z886 Allergy status to analgesic agent status: Secondary | ICD-10-CM | POA: Diagnosis not present

## 2015-06-03 DIAGNOSIS — Z9109 Other allergy status, other than to drugs and biological substances: Secondary | ICD-10-CM | POA: Diagnosis not present

## 2015-06-03 DIAGNOSIS — E669 Obesity, unspecified: Secondary | ICD-10-CM | POA: Diagnosis present

## 2015-06-03 DIAGNOSIS — G43909 Migraine, unspecified, not intractable, without status migrainosus: Secondary | ICD-10-CM | POA: Diagnosis present

## 2015-06-03 DIAGNOSIS — R0602 Shortness of breath: Secondary | ICD-10-CM

## 2015-06-03 DIAGNOSIS — R404 Transient alteration of awareness: Secondary | ICD-10-CM | POA: Diagnosis not present

## 2015-06-03 DIAGNOSIS — R05 Cough: Secondary | ICD-10-CM | POA: Diagnosis not present

## 2015-06-03 DIAGNOSIS — G44209 Tension-type headache, unspecified, not intractable: Secondary | ICD-10-CM | POA: Diagnosis not present

## 2015-06-03 DIAGNOSIS — G8929 Other chronic pain: Secondary | ICD-10-CM | POA: Diagnosis present

## 2015-06-03 DIAGNOSIS — Z86718 Personal history of other venous thrombosis and embolism: Secondary | ICD-10-CM

## 2015-06-03 DIAGNOSIS — J209 Acute bronchitis, unspecified: Secondary | ICD-10-CM | POA: Diagnosis not present

## 2015-06-03 DIAGNOSIS — Z87891 Personal history of nicotine dependence: Secondary | ICD-10-CM | POA: Diagnosis not present

## 2015-06-03 DIAGNOSIS — R0902 Hypoxemia: Secondary | ICD-10-CM | POA: Diagnosis not present

## 2015-06-03 DIAGNOSIS — J440 Chronic obstructive pulmonary disease with acute lower respiratory infection: Secondary | ICD-10-CM | POA: Diagnosis present

## 2015-06-03 DIAGNOSIS — Z86711 Personal history of pulmonary embolism: Secondary | ICD-10-CM

## 2015-06-03 DIAGNOSIS — Z7982 Long term (current) use of aspirin: Secondary | ICD-10-CM | POA: Diagnosis not present

## 2015-06-03 DIAGNOSIS — E860 Dehydration: Secondary | ICD-10-CM | POA: Diagnosis not present

## 2015-06-03 DIAGNOSIS — F419 Anxiety disorder, unspecified: Secondary | ICD-10-CM | POA: Diagnosis present

## 2015-06-03 DIAGNOSIS — Z96659 Presence of unspecified artificial knee joint: Secondary | ICD-10-CM | POA: Diagnosis present

## 2015-06-03 DIAGNOSIS — J40 Bronchitis, not specified as acute or chronic: Secondary | ICD-10-CM | POA: Diagnosis not present

## 2015-06-03 DIAGNOSIS — Z86010 Personal history of colonic polyps: Secondary | ICD-10-CM

## 2015-06-03 DIAGNOSIS — E119 Type 2 diabetes mellitus without complications: Secondary | ICD-10-CM | POA: Diagnosis not present

## 2015-06-03 DIAGNOSIS — Z8249 Family history of ischemic heart disease and other diseases of the circulatory system: Secondary | ICD-10-CM

## 2015-06-03 DIAGNOSIS — F329 Major depressive disorder, single episode, unspecified: Secondary | ICD-10-CM | POA: Diagnosis present

## 2015-06-03 DIAGNOSIS — Z9049 Acquired absence of other specified parts of digestive tract: Secondary | ICD-10-CM | POA: Diagnosis present

## 2015-06-03 DIAGNOSIS — I2699 Other pulmonary embolism without acute cor pulmonale: Secondary | ICD-10-CM | POA: Diagnosis not present

## 2015-06-03 DIAGNOSIS — R079 Chest pain, unspecified: Secondary | ICD-10-CM | POA: Diagnosis not present

## 2015-06-03 DIAGNOSIS — M1990 Unspecified osteoarthritis, unspecified site: Secondary | ICD-10-CM | POA: Diagnosis present

## 2015-06-03 DIAGNOSIS — R509 Fever, unspecified: Secondary | ICD-10-CM | POA: Diagnosis not present

## 2015-06-03 DIAGNOSIS — E039 Hypothyroidism, unspecified: Secondary | ICD-10-CM | POA: Diagnosis present

## 2015-06-03 DIAGNOSIS — J069 Acute upper respiratory infection, unspecified: Secondary | ICD-10-CM | POA: Diagnosis not present

## 2015-06-03 LAB — CBC WITH DIFFERENTIAL/PLATELET
Basophils Absolute: 0 10*3/uL (ref 0.0–0.1)
Basophils Relative: 0 %
Eosinophils Absolute: 0.2 10*3/uL (ref 0.0–0.7)
Eosinophils Relative: 2 %
HCT: 40.7 % (ref 36.0–46.0)
Hemoglobin: 12.6 g/dL (ref 12.0–15.0)
Lymphocytes Relative: 21 %
Lymphs Abs: 1.8 10*3/uL (ref 0.7–4.0)
MCH: 24.8 pg — ABNORMAL LOW (ref 26.0–34.0)
MCHC: 31 g/dL (ref 30.0–36.0)
MCV: 80 fL (ref 78.0–100.0)
Monocytes Absolute: 0.7 10*3/uL (ref 0.1–1.0)
Monocytes Relative: 8 %
Neutro Abs: 5.7 10*3/uL (ref 1.7–7.7)
Neutrophils Relative %: 69 %
Platelets: 281 10*3/uL (ref 150–400)
RBC: 5.09 MIL/uL (ref 3.87–5.11)
RDW: 18 % — ABNORMAL HIGH (ref 11.5–15.5)
WBC: 8.3 10*3/uL (ref 4.0–10.5)

## 2015-06-03 LAB — COMPREHENSIVE METABOLIC PANEL
ALT: 14 U/L (ref 14–54)
AST: 21 U/L (ref 15–41)
Albumin: 3.9 g/dL (ref 3.5–5.0)
Alkaline Phosphatase: 92 U/L (ref 38–126)
Anion gap: 9 (ref 5–15)
BUN: 12 mg/dL (ref 6–20)
CO2: 27 mmol/L (ref 22–32)
Calcium: 9.1 mg/dL (ref 8.9–10.3)
Chloride: 102 mmol/L (ref 101–111)
Creatinine, Ser: 0.65 mg/dL (ref 0.44–1.00)
GFR calc Af Amer: >60 mL/min (ref >60)
GFR calc non Af Amer: >60 mL/min (ref >60)
Glucose, Bld: 92 mg/dL (ref 65–99)
Potassium: 4.4 mmol/L (ref 3.5–5.1)
Sodium: 138 mmol/L (ref 135–145)
Total Bilirubin: 0.7 mg/dL (ref 0.3–1.2)
Total Protein: 7.8 g/dL (ref 6.5–8.1)

## 2015-06-03 LAB — GLUCOSE, CAPILLARY: Glucose-Capillary: 211 mg/dL — ABNORMAL HIGH (ref 65–99)

## 2015-06-03 MED ORDER — SODIUM CHLORIDE 0.9 % IV SOLN: 250.0000 mL | INTRAVENOUS | Status: DC | PRN

## 2015-06-03 MED ORDER — KETOROLAC TROMETHAMINE 30 MG/ML IJ SOLN
30.0000 mg | Freq: Once | INTRAMUSCULAR | Status: AC
  Administered 2015-06-03: 30 mg via INTRAMUSCULAR
  Filled 2015-06-03: qty 1

## 2015-06-03 MED ORDER — ONDANSETRON HCL 4 MG/2ML IJ SOLN
4.0000 mg | Freq: Four times a day (QID) | INTRAMUSCULAR | Status: DC | PRN
  Administered 2015-06-04: 4 mg via INTRAVENOUS
  Filled 2015-06-03 (×2): qty 2

## 2015-06-03 MED ORDER — CYCLOBENZAPRINE HCL 10 MG PO TABS
10.0000 mg | ORAL_TABLET | Freq: Every day | ORAL | Status: DC
  Administered 2015-06-03 – 2015-06-06 (×4): 10 mg via ORAL
  Filled 2015-06-03 (×6): qty 1

## 2015-06-03 MED ORDER — FLUTICASONE PROPIONATE 50 MCG/ACT NA SUSP: 1.0000 | Freq: Every day | NASAL | Status: DC | PRN

## 2015-06-03 MED ORDER — ONDANSETRON HCL 4 MG PO TABS: 4.0000 mg | ORAL_TABLET | Freq: Four times a day (QID) | ORAL | Status: DC | PRN

## 2015-06-03 MED ORDER — SODIUM CHLORIDE 0.9 % IJ SOLN
3.0000 mL | Freq: Two times a day (BID) | INTRAMUSCULAR | Status: DC
  Administered 2015-06-03: 3 mL via INTRAVENOUS

## 2015-06-03 MED ORDER — GABAPENTIN 400 MG PO CAPS
1600.0000 mg | ORAL_CAPSULE | Freq: Every day | ORAL | Status: DC
  Administered 2015-06-03 – 2015-06-06 (×4): 1600 mg via ORAL
  Filled 2015-06-03 (×5): qty 4

## 2015-06-03 MED ORDER — INSULIN ASPART 100 UNIT/ML ~~LOC~~ SOLN
0.0000 [IU] | Freq: Three times a day (TID) | SUBCUTANEOUS | Status: DC
  Administered 2015-06-04: 1 [IU] via SUBCUTANEOUS
  Administered 2015-06-04: 5 [IU] via SUBCUTANEOUS
  Administered 2015-06-04: 3 [IU] via SUBCUTANEOUS
  Administered 2015-06-05 – 2015-06-06 (×3): 1 [IU] via SUBCUTANEOUS

## 2015-06-03 MED ORDER — SUMATRIPTAN SUCCINATE 25 MG PO TABS
25.0000 mg | ORAL_TABLET | ORAL | Status: DC | PRN
  Administered 2015-06-05 – 2015-06-06 (×2): 25 mg via ORAL
  Filled 2015-06-03 (×5): qty 1

## 2015-06-03 MED ORDER — PANTOPRAZOLE SODIUM 40 MG PO TBEC
40.0000 mg | DELAYED_RELEASE_TABLET | Freq: Every day | ORAL | Status: DC
  Administered 2015-06-04 – 2015-06-07 (×4): 40 mg via ORAL
  Filled 2015-06-03 (×4): qty 1

## 2015-06-03 MED ORDER — FLUOROMETHOLONE 0.1 % OP SUSP
1.0000 [drp] | Freq: Every day | OPHTHALMIC | Status: DC | PRN
  Filled 2015-06-03: qty 5

## 2015-06-03 MED ORDER — KETOROLAC TROMETHAMINE 60 MG/2ML IM SOLN
60.0000 mg | Freq: Once | INTRAMUSCULAR | Status: AC
Start: 2015-06-03 — End: 2015-06-03
  Administered 2015-06-03: 60 mg via INTRAMUSCULAR
  Filled 2015-06-03: qty 2

## 2015-06-03 MED ORDER — LEVOTHYROXINE SODIUM 25 MCG PO TABS
25.0000 ug | ORAL_TABLET | Freq: Every day | ORAL | Status: DC
  Administered 2015-06-04 – 2015-06-07 (×4): 25 ug via ORAL
  Filled 2015-06-03 (×5): qty 1

## 2015-06-03 MED ORDER — METOPROLOL TARTRATE 25 MG PO TABS
25.0000 mg | ORAL_TABLET | Freq: Two times a day (BID) | ORAL | Status: DC
  Filled 2015-06-03 (×3): qty 1

## 2015-06-03 MED ORDER — SODIUM CHLORIDE 0.9 % IJ SOLN: 3.0000 mL | INTRAMUSCULAR | Status: DC | PRN

## 2015-06-03 MED ORDER — SERTRALINE HCL 100 MG PO TABS
100.0000 mg | ORAL_TABLET | Freq: Every day | ORAL | Status: DC
  Administered 2015-06-05 – 2015-06-07 (×3): 100 mg via ORAL
  Filled 2015-06-03 (×4): qty 1

## 2015-06-03 MED ORDER — IOHEXOL 350 MG/ML SOLN
100.0000 mL | Freq: Once | INTRAVENOUS | Status: AC | PRN
  Administered 2015-06-03: 100 mL via INTRAVENOUS

## 2015-06-03 MED ORDER — IPRATROPIUM-ALBUTEROL 0.5-2.5 (3) MG/3ML IN SOLN
3.0000 mL | Freq: Three times a day (TID) | RESPIRATORY_TRACT | Status: DC
  Administered 2015-06-04 (×2): 3 mL via RESPIRATORY_TRACT
  Filled 2015-06-03 (×2): qty 3

## 2015-06-03 MED ORDER — TAPENTADOL HCL 50 MG PO TABS
50.0000 mg | ORAL_TABLET | Freq: Three times a day (TID) | ORAL | Status: DC | PRN
  Administered 2015-06-05 – 2015-06-06 (×5): 50 mg via ORAL
  Filled 2015-06-03 (×8): qty 1

## 2015-06-03 MED ORDER — IPRATROPIUM-ALBUTEROL 0.5-2.5 (3) MG/3ML IN SOLN
3.0000 mL | Freq: Once | RESPIRATORY_TRACT | Status: AC
  Administered 2015-06-03: 3 mL via RESPIRATORY_TRACT
  Filled 2015-06-03: qty 3

## 2015-06-03 MED ORDER — ALBUTEROL SULFATE (2.5 MG/3ML) 0.083% IN NEBU
2.5000 mg | INHALATION_SOLUTION | Freq: Four times a day (QID) | RESPIRATORY_TRACT | Status: DC | PRN
  Administered 2015-06-05 – 2015-06-07 (×4): 2.5 mg via RESPIRATORY_TRACT
  Filled 2015-06-03 (×4): qty 3

## 2015-06-03 MED ORDER — SODIUM CHLORIDE 0.9 % IV BOLUS (SEPSIS)
1000.0000 mL | Freq: Once | INTRAVENOUS | Status: AC
  Administered 2015-06-03: 1000 mL via INTRAVENOUS

## 2015-06-03 MED ORDER — METHYLPREDNISOLONE SODIUM SUCC 125 MG IJ SOLR
125.0000 mg | Freq: Once | INTRAMUSCULAR | Status: AC
  Administered 2015-06-04: 125 mg via INTRAVENOUS
  Filled 2015-06-03: qty 2

## 2015-06-03 MED ORDER — SUMATRIPTAN SUCCINATE 6 MG/0.5ML ~~LOC~~ SOLN
6.0000 mg | Freq: Once | SUBCUTANEOUS | Status: AC
  Administered 2015-06-03: 6 mg via SUBCUTANEOUS
  Filled 2015-06-03: qty 0.5

## 2015-06-03 MED ORDER — GABAPENTIN 400 MG PO CAPS
800.0000 mg | ORAL_CAPSULE | Freq: Every day | ORAL | Status: DC
  Administered 2015-06-04 – 2015-06-07 (×4): 800 mg via ORAL
  Filled 2015-06-03 (×4): qty 2

## 2015-06-03 MED ORDER — ACETAMINOPHEN 325 MG PO TABS
650.0000 mg | ORAL_TABLET | Freq: Four times a day (QID) | ORAL | Status: DC | PRN
  Filled 2015-06-03: qty 2

## 2015-06-03 MED ORDER — METHYLPREDNISOLONE SODIUM SUCC 125 MG IJ SOLR
125.0000 mg | Freq: Once | INTRAMUSCULAR | Status: AC
  Administered 2015-06-03: 125 mg via INTRAVENOUS
  Filled 2015-06-03: qty 2

## 2015-06-03 MED ORDER — ACETAMINOPHEN 650 MG RE SUPP
650.0000 mg | Freq: Four times a day (QID) | RECTAL | Status: DC | PRN
Start: 2015-06-03 — End: 2015-06-07

## 2015-06-03 MED ORDER — IPRATROPIUM-ALBUTEROL 0.5-2.5 (3) MG/3ML IN SOLN
3.0000 mL | RESPIRATORY_TRACT | Status: DC
  Administered 2015-06-03 (×2): 3 mL via RESPIRATORY_TRACT
  Filled 2015-06-03 (×2): qty 3

## 2015-06-03 MED ORDER — PANTOPRAZOLE SODIUM 40 MG IV SOLR
40.0000 mg | Freq: Once | INTRAVENOUS | Status: AC
  Administered 2015-06-04: 40 mg via INTRAVENOUS
  Filled 2015-06-03: qty 40

## 2015-06-03 MED ORDER — INSULIN ASPART 100 UNIT/ML ~~LOC~~ SOLN
0.0000 [IU] | Freq: Every day | SUBCUTANEOUS | Status: DC
  Administered 2015-06-03: 2 [IU] via SUBCUTANEOUS

## 2015-06-03 MED ORDER — ALUM & MAG HYDROXIDE-SIMETH 200-200-20 MG/5ML PO SUSP: 30.0000 mL | Freq: Four times a day (QID) | ORAL | Status: DC | PRN

## 2015-06-03 NOTE — ED Notes (Signed)
Per EMS- Patient was seen at the doctor's office. Sat's in the mid 80's on room air. O2L/min via [place] started. Patient c/o cough, headache, and an intermittent fever. PCP wanted patient to come tot he ED tl r/o pneumonia. Patient has a history of frequent URI.

## 2015-06-03 NOTE — ED Notes (Signed)
Patient ambulated with little assistance to restroom, patient's oxygen dropped down to 86% on room air.

## 2015-06-03 NOTE — H&P (Signed)
[facility] Admission History and Physical       Hayley [name] YIF:[id number] DOB: [birth year] DOA: 06/03/2015  Referring physician: EDP PCP: [name], FNP  Specialists:   Chief Complaint: SOB  HPI: Hayley [name] is a 69 y.o. female with a history of RAD, DM2, OSA, and Migraines who presents to the ED with complaints of SOB.   She had been seen in her PCP's Office and was found to have hypoxia into the 80's and was sent to the ED via EMS.   She was evaluated in the ED and found to have Wheezing as well and due to her previous history of a PE she had a CTA of the Chest in addition to  A chest X-ray both of which were negative for acute findings.    She was administered a continuous nebulizer treatment and IV Solumedrol x 1 and referred for admission.        Review of Systems: Constitutional: No Weight Loss, No Weight Gain, Night Sweats, Fevers, Chills, Dizziness, Light Headedness, Fatigue, or Generalized Weakness HEENT: No Headaches, Difficulty Swallowing,Tooth/Dental Problems,Sore Throat,  No Sneezing, Rhinitis, Ear Ache, Nasal Congestion, or Post Nasal Drip,  Cardio-vascular:  No Chest pain, Orthopnea, PND, Edema in Lower Extremities, Anasarca, Dizziness, Palpitations  Resp: +Dyspnea, No DOE, No Productive Cough, No Non-Productive Cough, No Hemoptysis, +Wheezing.    GI: No Heartburn, Indigestion, Abdominal Pain, Nausea, Vomiting, Diarrhea, Constipation, Hematemesis, Hematochezia, Melena, Change in Bowel Habits,  Loss of Appetite  GU: No Dysuria, No Change in Color of Urine, No Urgency or Urinary Frequency, No Flank pain.  Musculoskeletal: No Joint Pain or Swelling, No Decreased Range of Motion, No Back Pain.  Neurologic: No Syncope, No Seizures, Muscle Weakness, Paresthesia, Vision Disturbance or Loss, No Diplopia, No Vertigo, No Difficulty Walking,  Skin: No Rash or Lesions. Psych: No Change in Mood or Affect, No Depression or Anxiety, No Memory loss, No Confusion, or  Hallucinations   Past Medical History  Diagnosis Date  . Hyperthyroidism   . Toxic goiter   . Reflux   . Colon polyps   . Migraines   . 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 Obesity, Class III, BMI 40-49.9 (morbid obesity) 04/27/2010    02-17-14 reports some weight loss- intentional  . Pelvic pain     02-17-14 "states thinks its back related on left groin"  . Anemia   . Clotting disorder 02-17-14    S/p appendectomy-developed Pulmonary emboli-tx. warfarin-d/c 1 yr ago.  . Osteoarthritis     in back(chronic pain)  . Pulmonary emboli     s/p Appendectomy '13 [facility]  . Diabetes mellitus without complication     "borderline"- oral med  . Right knee pain   . Sleep apnea     cpap - settings at 4   . Bronchitis     hx of   . Anxiety   . Depression   . Kidney stones   . Neuromuscular disorder     hands and throat"spasmodic dysphonia", tremors - thimb and forefinger   . Nausea Nov 15, 2014    cycle of migraine headaches and nausea     Past Surgical History  Procedure Laterality Date  . Abdominal hysterectomy    . Zenkers diverticulum    . Kidney stone surgery    . Laparoscopic appendectomy  11/27/2011    Procedure: APPENDECTOMY LAPAROSCOPIC;  Surgeon: Adin Hector, MD;  Location: [facility];  Service: General;  Laterality: N/A;  . Cholecystectomy    . Knee arthroscopy  1998    right  . Hand surgery  2004    both hands  . Botox injection      for migranes  . Cataract extraction, bilateral Bilateral 03/2013   . Vocal cord surgery  02-17-14    01-12-14 done in [place] -[facility](s/p selective denervation/reinnervation recurrent laryngeal nerve surgery)  . Appendectomy      3'13 [facility] s/p developed  Pulmonary Emboli  . Total knee revision Left 02/24/2014    Procedure: LEFT TOTAL KNEE REVISION;  Surgeon: Gearlean Alf, MD;  Location: [facility];  Service: Orthopedics;  Laterality: Left;  . Total knee arthroplasty Right 12/06/2014     Procedure: RIGHT TOTAL KNEE ARTHROPLASTY;  Surgeon: Gaynelle Arabian, MD;  Location: [facility];  Service: Orthopedics;  Laterality: Right;      Prior to Admission medications   Medication Sig Start Date End Date Taking? Authorizing Provider  albuterol (PROVENTIL HFA;VENTOLIN HFA) 108 (90 BASE) MCG/ACT inhaler Inhale 1 puff into the lungs every 6 (six) hours as needed for wheezing or shortness of breath.   Yes Historical Provider, MD  albuterol (PROVENTIL) (2.5 MG/3ML) 0.083% nebulizer solution Take 2.5 mg by nebulization every 6 (six) hours as needed for wheezing or shortness of breath.   Yes Historical Provider, MD  aspirin 81 MG tablet Take 81 mg by mouth daily.   Yes Historical Provider, MD  clonazePAM (KLONOPIN) 0.5 MG tablet Take 0.5 mg by mouth at bedtime.   Yes Historical Provider, MD  cyclobenzaprine (FLEXERIL) 10 MG tablet TAKE 1 TABLET BY MOUTH AT BEDTIME Patient taking differently: TAKE 10 MG BY MOUTH AT BEDTIME 05/06/15  Yes Monina C Medina-Vargas, NP  EPINEPHrine (EPIPEN) 0.3 mg/0.3 mL DEVI Inject 0.3 mg into the muscle once as needed (Allergic reaction).    Yes Historical Provider, MD  fluorometholone (FML) 0.1 % ophthalmic suspension Place 1 drop into both eyes daily as needed (Eye allergies).  08/26/14  Yes Historical Provider, MD  fluticasone (FLONASE) 50 MCG/ACT nasal spray Place 1 spray into both nostrils daily as needed for allergies or rhinitis.   Yes Historical Provider, MD  gabapentin (NEURONTIN) 800 MG tablet Take 800-1,600 mg by mouth 2 (two) times daily. 800 in the morning and 1600 at pm   Yes Historical Provider, MD  levothyroxine (SYNTHROID, LEVOTHROID) 25 MCG tablet Take 25 mcg by mouth every morning.    Yes Historical Provider, MD  methocarbamol (ROBAXIN) 500 MG tablet Take 1 tablet (500 mg total) by mouth every 6 (six) hours as needed for muscle spasms. 12/08/14  Yes Arlee Muslim, PA-C  metoprolol tartrate (LOPRESSOR) 25 MG tablet Take 25 mg by mouth 2 (two) times daily.    Yes Historical Provider, MD  mometasone-formoterol (DULERA) 100-5 MCG/ACT AERO Inhale 2 puffs into the lungs 2 (two) times daily.   Yes Historical Provider, MD  ondansetron (ZOFRAN) 4 MG tablet Take 4 mg by mouth every 8 (eight) hours as needed for nausea or vomiting.   Yes Historical Provider, MD  ondansetron (ZOFRAN-ODT) 8 MG disintegrating tablet Take 8 mg by mouth every 8 (eight) hours as needed for nausea or vomiting.   Yes Historical Provider, MD  pantoprazole (PROTONIX) 40 MG tablet Take 40 mg by mouth daily.   Yes Historical Provider, MD  PRADAXA 150 MG CAPS capsule Take 150 mg by mouth 2 (two) times daily. 05/26/15  Yes Historical Provider, MD  promethazine (PHENERGAN) 25 MG tablet Take  25 mg by mouth every 6 (six) hours as needed for nausea or vomiting.   Yes Historical Provider, MD  propranolol (INDERAL) 40 MG tablet Take 1 tablet (40 mg total) by mouth 2 (two) times daily. 04/26/15  Yes Marcial Pacas, MD  rizatriptan (MAXALT) 10 MG tablet Take 10 mg by mouth as needed for migraine (migraine). May repeat in 2 hours if needed   Yes Historical Provider, MD  sertraline (ZOLOFT) 100 MG tablet Take 100 mg by mouth daily.   Yes Historical Provider, MD  sitaGLIPtin (JANUVIA) 100 MG tablet Take 100 mg by mouth every evening.    Yes Historical Provider, MD  SUMAtriptan (IMITREX) 25 MG tablet Take 1 tablet (25 mg total) by mouth every 2 (two) hours as needed for migraine. May repeat in 2 hours if headache persists or recurs. 04/26/15  Yes Marcial Pacas, MD  SUMAtriptan Succinate Refill 4 MG/0.5ML SOCT Inject 4 mg into the skin as needed. Patient taking differently: Inject 4 mg into the skin as needed (migraines).  04/26/15  Yes Marcial Pacas, MD  tapentadol (NUCYNTA) 50 MG TABS tablet Take 50 mg by mouth 3 (three) times daily as needed for moderate pain or severe pain.  05/18/15 06/17/15 Yes Historical Provider, MD  valACYclovir (VALTREX) 1000 MG tablet Take 2,000 mg by mouth 2 (two) times daily as needed (Fever  blisters).  10/14/14  Yes Historical Provider, MD     Allergies  Allergen Reactions  . Nutritional Supplements Anaphylaxis    Pt does not remember this happening  . Tape Rash    Rash with paper tape  . Bee Venom     unknown  . Etodolac Nausea And Vomiting  . Morphine Other (See Comments)    Hallucinations  . Other Itching and Swelling  . Propoxyphene   . Propoxyphene N-Acetaminophen Nausea And Vomiting  . Penicillins Rash    Social History:  reports that she quit smoking about 33 years ago. She has never used smokeless tobacco. She reports that she does not drink alcohol or use illicit drugs.    Family History  Problem Relation Age of Onset  . Heart disease Mother   . Pneumonia Father   . Cirrhosis Father   . Hypertension Father   . Heart attack Paternal Grandfather   . Heart attack Maternal Grandfather   . Heart attack Paternal Grandmother   . Heart attack Maternal Grandmother   . Alcoholism Father        Physical Exam:  GEN:  Pleasant Obese 69 y.o. Caucasian female examined and in no acute distress; cooperative with exam Filed Vitals:   06/03/15 1600 06/03/15 1700 06/03/15 1900 06/03/15 2022  BP: 100/53 109/58 103/64 100/56  Pulse: 56 61  78  Temp:      TempSrc:      Resp: 17 16 17 18   Height:      Weight:      SpO2: 94% 89%  94%   Blood pressure 100/56, pulse 78, temperature 98.3 F (36.8 C), temperature source Oral, resp. rate 18, height 5' 6.5" (1.689 m), weight 94.348 kg (208 lb), SpO2 94 %. PSYCH: She is alert and oriented x4; does not appear anxious does not appear depressed; affect is normal HEENT: Normocephalic and Atraumatic, Mucous membranes pink; PERRLA; EOM intact; Fundi:  Benign;  No scleral icterus, Nares: Patent, Oropharynx: Clear,     Neck:  FROM, No Cervical Lymphadenopathy nor Thyromegaly or Carotid Bruit; No JVD; Breasts:: Not examined CHEST WALL: No tenderness CHEST: Normal  respiration, clear to auscultation bilaterally HEART: Regular  rate and rhythm; no murmurs rubs or gallops BACK: No kyphosis or scoliosis; No CVA tenderness ABDOMEN: Positive Bowel Sounds, Obese, Soft Non-Tender, No Rebound or Guarding; No Masses, No Organomegaly Rectal Exam: Not done EXTREMITIES: No Cyanosis, Clubbing, or Edema; No Ulcerations. Genitalia: not examined PULSES: 2+ and symmetric SKIN: Normal hydration no rash or ulceration CNS:  Alert and Oriented x 4, No Focal Deficits Vascular: pulses palpable throughout    Labs on Admission:  Basic Metabolic Panel:  Recent Labs Lab 06/03/15 1233  NA 138  K 4.4  CL 102  CO2 27  GLUCOSE 92  BUN 12  CREATININE 0.65  CALCIUM 9.1   Liver Function Tests:  Recent Labs Lab 06/03/15 1233  AST 21  ALT 14  ALKPHOS 92  BILITOT 0.7  PROT 7.8  ALBUMIN 3.9   No results for input(s): LIPASE, AMYLASE in the last 168 hours. No results for input(s): AMMONIA in the last 168 hours. CBC:  Recent Labs Lab 06/03/15 1233  WBC 8.3  NEUTROABS 5.7  HGB 12.6  HCT 40.7  MCV 80.0  PLT 281   Cardiac Enzymes: No results for input(s): CKTOTAL, CKMB, CKMBINDEX, TROPONINI in the last 168 hours.  BNP (last 3 results)  Recent Labs  12/09/14 1858  BNP 46.7    ProBNP (last 3 results) No results for input(s): PROBNP in the last 8760 hours.  CBG: No results for input(s): GLUCAP in the last 168 hours.  Radiological Exams on Admission: Dg Chest 2 View  06/03/2015   CLINICAL DATA:  Right-sided chest pain, shortness of breath, cough and congestion 5 days.  EXAM: CHEST  2 VIEW  COMPARISON:  10/26/2014  FINDINGS: Lungs are adequately inflated without consolidation or effusion. Cardiomediastinal silhouette is within normal. There is mild degenerative change of the spine.  IMPRESSION: No active cardiopulmonary disease.   Electronically Signed   By: Marin Olp M.D.   On: 06/03/2015 12:50   Ct Angio Chest Pe W/cm &/or Wo Cm  06/03/2015   CLINICAL DATA:  Shortness of breath and hypoxia  EXAM: CT  ANGIOGRAPHY CHEST WITH CONTRAST  TECHNIQUE: Multidetector CT imaging of the chest was performed using the standard protocol during bolus administration of intravenous contrast. Multiplanar CT image reconstructions and MIPs were obtained to evaluate the vascular anatomy.  CONTRAST:  146mL OMNIPAQUE IOHEXOL 350 MG/ML SOLN  COMPARISON:  12/09/2014  FINDINGS: THORACIC INLET/BODY WALL:  No acute finding.  Bilateral sub glandular breast implant.  MEDIASTINUM:  Normal heart size. No pericardial effusion. Atherosclerosis, including along the proximal LAD. No evidence of acute pulmonary embolism. Filling defects seen previously have resolved. Tortuous but non aneurysmal aorta. No evidence of acute aortic pathology.  LUNG WINDOWS:  Subsegmental bibasilar atelectasis or scarring. No consolidation. No effusion. No suspicious pulmonary nodule.  UPPER ABDOMEN:  No acute findings.  OSSEOUS:  Diffuse disc narrowing and endplate spurring. No acute osseous finding.  Review of the MIP images confirms the above findings.  IMPRESSION: 1. No evidence of acute pulmonary embolism. Emboli seen 12/09/2014 have resolved. 2. Subsegmental atelectasis or scarring at the bases.   Electronically Signed   By: Monte Fantasia M.D.   On: 06/03/2015 16:58          Assessment/Plan:   69 y.o. female with  Principal Problem:   1.     COPD exacerbation   DUONebs   IV Steroid Taper   O2    Monitor O2 sats   Active  Problems:   2.    Acute respiratory failure with hypoxia   Monitor O2 sats     3.    Diabetes mellitus without complication   Hold Januvia Rx   SSI coverage PRN   Check Hb A1C       4.    Essential hypertension   Continue Metoprolol Rx     5.    Hypothyroidism  ` Continue Levothyroxine Rx     6.    OSA on CPAP   CPAP qhs     7.    DVT Prophylaxis   On Pradaxa Rx    Code Status:     FULL CODE       Family Communication:   No Family Present    Disposition Plan:    Inpatient Status with Expected stay 2  days and then to return home     Time spent: [facility] Pager [phone]   If [place] Please Contact the Day Rounding Team MD for [facility]  If 7PM-7AM, Please Contact Night-Floor Coverage  [email or web address] Password [email or web address] 06/03/2015, 8:49 PM     ADDENDUM:   Patient was seen and examined on 06/03/2015

## 2015-06-03 NOTE — ED Notes (Signed)
Bed: WA08 Expected date:  Expected time:  Means of arrival:  Comments: EMS sob, cough

## 2015-06-03 NOTE — ED Provider Notes (Signed)
CSN: [id number]     Arrival date & time 06/03/15  1106 History   First MD Initiated Contact with Patient 06/03/15 1117     Chief Complaint  Patient presents with  . Cough  . Headache     (Consider location/radiation/quality/duration/timing/severity/associated sxs/prior Treatment) Patient is a 69 y.o. female presenting with cough. The history is provided by the patient. No language interpreter was used.  Cough Cough characteristics:  Productive Sputum characteristics:  Clear Severity:  Moderate Onset quality:  Gradual Duration:  6 days Timing:  Constant Progression:  Worsening Chronicity:  New Smoker: no   Context: sick contacts and with activity   Relieved by:  Nothing Worsened by:  Nothing tried Ineffective treatments:  None tried Associated symptoms: no sore throat and no wheezing     Past Medical History  Diagnosis Date  . Hyperthyroidism   . Toxic goiter   . Reflux   . Colon polyps   . Migraines   . 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 Obesity, Class III, BMI 40-49.9 (morbid obesity) 04/27/2010    02-17-14 reports some weight loss- intentional  . Pelvic pain     02-17-14 "states thinks its back related on left groin"  . Anemia   . Clotting disorder 02-17-14    S/p appendectomy-developed Pulmonary emboli-tx. warfarin-d/c 1 yr ago.  . Osteoarthritis     in back(chronic pain)  . Pulmonary emboli     s/p Appendectomy '13 [facility]  . Diabetes mellitus without complication     "borderline"- oral med  . Right knee pain   . Sleep apnea     cpap - settings at 4   . Bronchitis     hx of   . Anxiety   . Depression   . Kidney stones   . Neuromuscular disorder     hands and throat"spasmodic dysphonia", tremors - thimb and forefinger   . Nausea Nov 15, 2014    cycle of migraine headaches and nausea   Past Surgical History  Procedure Laterality Date  . Abdominal hysterectomy    . Zenkers diverticulum    . Kidney stone  surgery    . Laparoscopic appendectomy  11/27/2011    Procedure: APPENDECTOMY LAPAROSCOPIC;  Surgeon: Adin Hector, MD;  Location: [facility];  Service: General;  Laterality: N/A;  . Cholecystectomy    . Knee arthroscopy  1998    right  . Hand surgery  2004    both hands  . Botox injection      for migranes  . Cataract extraction, bilateral Bilateral 03/2013   . Vocal cord surgery  02-17-14    01-12-14 done in [place] [facility](s/p selective denervation/reinnervation recurrent laryngeal nerve surgery)  . Appendectomy      3'13 [facility] s/p developed  Pulmonary Emboli  . Total knee revision Left 02/24/2014    Procedure: LEFT TOTAL KNEE REVISION;  Surgeon: Gearlean Alf, MD;  Location: [facility];  Service: Orthopedics;  Laterality: Left;  . Total knee arthroplasty Right 12/06/2014    Procedure: RIGHT TOTAL KNEE ARTHROPLASTY;  Surgeon: Gaynelle Arabian, MD;  Location: [facility];  Service: Orthopedics;  Laterality: Right;   Family History  Problem Relation Age of Onset  . Heart disease Mother   . Pneumonia Father   . Cirrhosis Father   . Hypertension Father   . Heart attack Paternal Grandfather   . Heart attack Maternal Grandfather   . Heart attack Paternal Grandmother   .  Heart attack Maternal Grandmother   . Alcoholism Father    Social History  Substance Use Topics  . Smoking status: Former Smoker    Quit date: 01/03/1982  . Smokeless tobacco: Never Used  . Alcohol Use: No   OB History    No data available     Review of Systems  HENT: Negative for sore throat.   Respiratory: Positive for cough. Negative for wheezing.   Gastrointestinal: Positive for nausea.  All other systems reviewed and are negative.     Allergies  Nutritional supplements; Tape; Bee venom; Etodolac; Morphine; Other; Propoxyphene; Propoxyphene n-acetaminophen; and Penicillins  Home Medications   Prior to Admission medications   Medication Sig Start Date End Date Taking? Authorizing Rasheen Bells  albuterol  (PROVENTIL HFA;VENTOLIN HFA) 108 (90 BASE) MCG/ACT inhaler Inhale 1 puff into the lungs every 6 (six) hours as needed for wheezing or shortness of breath.   Yes Historical Mariajose Mow, MD  albuterol (PROVENTIL) (2.5 MG/3ML) 0.083% nebulizer solution Take 2.5 mg by nebulization every 6 (six) hours as needed for wheezing or shortness of breath.   Yes Historical Andres Escandon, MD  aspirin 81 MG tablet Take 81 mg by mouth daily.   Yes Historical Noely Kuhnle, MD  clonazePAM (KLONOPIN) 0.5 MG tablet Take 0.5 mg by mouth at bedtime.   Yes Historical Katryn Plummer, MD  cyclobenzaprine (FLEXERIL) 10 MG tablet TAKE 1 TABLET BY MOUTH AT BEDTIME Patient taking differently: TAKE 10 MG BY MOUTH AT BEDTIME 05/06/15  Yes Monina C Medina-Vargas, NP  EPINEPHrine (EPIPEN) 0.3 mg/0.3 mL DEVI Inject 0.3 mg into the muscle once as needed (Allergic reaction).    Yes Historical Brenly Trawick, MD  fluorometholone (FML) 0.1 % ophthalmic suspension Place 1 drop into both eyes daily as needed (Eye allergies).  08/26/14  Yes Historical Tessi Eustache, MD  fluticasone (FLONASE) 50 MCG/ACT nasal spray Place 1 spray into both nostrils daily as needed for allergies or rhinitis.   Yes Historical Garry Bochicchio, MD  gabapentin (NEURONTIN) 800 MG tablet Take 800-1,600 mg by mouth 2 (two) times daily. 800 in the morning and 1600 at pm   Yes Historical Glorie Dowlen, MD  levothyroxine (SYNTHROID, LEVOTHROID) 25 MCG tablet Take 25 mcg by mouth every morning.    Yes Historical Harl Wiechmann, MD  methocarbamol (ROBAXIN) 500 MG tablet Take 1 tablet (500 mg total) by mouth every 6 (six) hours as needed for muscle spasms. 12/08/14  Yes Arlee Muslim, PA-C  metoprolol tartrate (LOPRESSOR) 25 MG tablet Take 25 mg by mouth 2 (two) times daily.   Yes Historical Debria Broecker, MD  mometasone-formoterol (DULERA) 100-5 MCG/ACT AERO Inhale 2 puffs into the lungs 2 (two) times daily.   Yes Historical Berlyn Malina, MD  ondansetron (ZOFRAN) 4 MG tablet Take 4 mg by mouth every 8 (eight) hours as needed for  nausea or vomiting.   Yes Historical Mishael Haran, MD  ondansetron (ZOFRAN-ODT) 8 MG disintegrating tablet Take 8 mg by mouth every 8 (eight) hours as needed for nausea or vomiting.   Yes Historical Faithlynn Deeley, MD  pantoprazole (PROTONIX) 40 MG tablet Take 40 mg by mouth daily.   Yes Historical Thelbert Gartin, MD  PRADAXA 150 MG CAPS capsule Take 150 mg by mouth 2 (two) times daily. 05/26/15  Yes Historical Mckinna Demars, MD  promethazine (PHENERGAN) 25 MG tablet Take 25 mg by mouth every 6 (six) hours as needed for nausea or vomiting.   Yes Historical Shiann Kam, MD  propranolol (INDERAL) 40 MG tablet Take 1 tablet (40 mg total) by mouth 2 (two) times daily. 04/26/15  Yes  Marcial Pacas, MD  rizatriptan (MAXALT) 10 MG tablet Take 10 mg by mouth as needed for migraine (migraine). May repeat in 2 hours if needed   Yes Historical Nadirah Socorro, MD  sertraline (ZOLOFT) 100 MG tablet Take 100 mg by mouth daily.   Yes Historical Darion Milewski, MD  sitaGLIPtin (JANUVIA) 100 MG tablet Take 100 mg by mouth every evening.    Yes Historical Fitzgerald Dunne, MD  SUMAtriptan (IMITREX) 25 MG tablet Take 1 tablet (25 mg total) by mouth every 2 (two) hours as needed for migraine. May repeat in 2 hours if headache persists or recurs. 04/26/15  Yes Marcial Pacas, MD  SUMAtriptan Succinate Refill 4 MG/0.5ML SOCT Inject 4 mg into the skin as needed. Patient taking differently: Inject 4 mg into the skin as needed (migraines).  04/26/15  Yes Marcial Pacas, MD  tapentadol (NUCYNTA) 50 MG TABS tablet Take 50 mg by mouth 3 (three) times daily as needed for moderate pain or severe pain.  05/18/15 06/17/15 Yes Historical Leontina Skidmore, MD  valACYclovir (VALTREX) 1000 MG tablet Take 2,000 mg by mouth 2 (two) times daily as needed (Fever blisters).  10/14/14  Yes Historical Elanah Osmanovic, MD   BP 104/50 mmHg  Pulse 55  Temp(Src) 98.3 F (36.8 C) (Oral)  Resp 15  Ht 5' 6.5" (1.689 m)  Wt 208 lb (94.348 kg)  BMI 33.07 kg/m2  SpO2 95% Physical Exam  Constitutional: She appears  well-developed and well-nourished.  HENT:  Head: Normocephalic and atraumatic.  Right Ear: External ear normal.  Left Ear: External ear normal.  Nose: Nose normal.  Mouth/Throat: Oropharynx is clear and moist.  Eyes: Conjunctivae are normal. Pupils are equal, round, and reactive to light.  Neck: Normal range of motion. Neck supple.  Cardiovascular: Normal rate and normal heart sounds.   Pulmonary/Chest: Effort normal.  Musculoskeletal: Normal range of motion.  Neurological: She is alert.  Skin: Skin is warm.  Nursing note and vitals reviewed.   ED Course  Procedures (including critical care time) Labs Review Labs Reviewed  CBC WITH DIFFERENTIAL/PLATELET - Abnormal; Notable for the following:    MCH 24.8 (*)    RDW 18.0 (*)    All other components within normal limits  COMPREHENSIVE METABOLIC PANEL    Imaging Review Dg Chest 2 View  06/03/2015   CLINICAL DATA:  Right-sided chest pain, shortness of breath, cough and congestion 5 days.  EXAM: CHEST  2 VIEW  COMPARISON:  10/26/2014  FINDINGS: Lungs are adequately inflated without consolidation or effusion. Cardiomediastinal silhouette is within normal. There is mild degenerative change of the spine.  IMPRESSION: No active cardiopulmonary disease.   Electronically Signed   By: Marin Olp M.D.   On: 06/03/2015 12:50   I have personally reviewed and evaluated these images and lab results as part of my medical decision-making.   EKG Interpretation None      MDM  Pt was seen by her MD.  Pt advised to come in for evaluation for possible pneumonia and for treatment of her headache.    Final diagnoses:  Tension-type headache, not intractable, unspecified chronicity pattern  Bronchitis    Pt's 02 sats 94-96 at rest. Pt has on 2 liters.  Pt has 02 at home but only uses it for cpap.  Pt has history of Copd.  Pt given solumedrol and albuterol neb.   I will try pt on oral fluids and meal.  Pt given Iv normal saline.    Fransico Meadow, PA-C 06/03/15 [place]  Brandon Melnick, PA-C 06/03/15 [place], DO 06/04/15 2034

## 2015-06-03 NOTE — Progress Notes (Signed)
Patient has documented history of nocturnal CPAP use. Order placed for use during this hospital stay. However, she does not wish to use it tonight due to the sinus congestion she is currently experiencing. She does wish to try tomorrow night if she is feeling less congested. RT will continue to follow.

## 2015-06-03 NOTE — ED Notes (Signed)
LAB DRAW UNSUCCESSFUL 

## 2015-06-04 DIAGNOSIS — I2699 Other pulmonary embolism without acute cor pulmonale: Secondary | ICD-10-CM

## 2015-06-04 DIAGNOSIS — J209 Acute bronchitis, unspecified: Secondary | ICD-10-CM

## 2015-06-04 LAB — BASIC METABOLIC PANEL
Anion gap: 5 (ref 5–15)
BUN: 18 mg/dL (ref 6–20)
CO2: 26 mmol/L (ref 22–32)
Calcium: 8.8 mg/dL — ABNORMAL LOW (ref 8.9–10.3)
Chloride: 109 mmol/L (ref 101–111)
Creatinine, Ser: 0.61 mg/dL (ref 0.44–1.00)
GFR calc Af Amer: >60 mL/min (ref >60)
GFR calc non Af Amer: >60 mL/min (ref >60)
Glucose, Bld: 149 mg/dL — ABNORMAL HIGH (ref 65–99)
Potassium: 4 mmol/L (ref 3.5–5.1)
Sodium: 140 mmol/L (ref 135–145)

## 2015-06-04 LAB — D-DIMER, QUANTITATIVE (NOT AT ARMC): D-Dimer, Quant: 0.57 ug/mL-FEU — ABNORMAL HIGH (ref 0.00–0.48)

## 2015-06-04 LAB — GLUCOSE, CAPILLARY
Glucose-Capillary: 131 mg/dL — ABNORMAL HIGH (ref 65–99)
Glucose-Capillary: 278 mg/dL — ABNORMAL HIGH (ref 65–99)
Glucose-Capillary: 201 mg/dL — ABNORMAL HIGH (ref 65–99)
Glucose-Capillary: 152 mg/dL — ABNORMAL HIGH (ref 65–99)

## 2015-06-04 LAB — CBC
HCT: 36.4 % (ref 36.0–46.0)
Hemoglobin: 11.2 g/dL — ABNORMAL LOW (ref 12.0–15.0)
MCH: 24.2 pg — ABNORMAL LOW (ref 26.0–34.0)
MCHC: 30.8 g/dL (ref 30.0–36.0)
MCV: 78.6 fL (ref 78.0–100.0)
Platelets: 267 10*3/uL (ref 150–400)
RBC: 4.63 MIL/uL (ref 3.87–5.11)
RDW: 17.6 % — ABNORMAL HIGH (ref 11.5–15.5)
WBC: 10.2 10*3/uL (ref 4.0–10.5)

## 2015-06-04 MED ORDER — DABIGATRAN ETEXILATE MESYLATE 150 MG PO CAPS
150.0000 mg | ORAL_CAPSULE | Freq: Two times a day (BID) | ORAL | Status: DC
  Administered 2015-06-04 – 2015-06-07 (×7): 150 mg via ORAL
  Filled 2015-06-04 (×9): qty 1

## 2015-06-04 MED ORDER — CLONAZEPAM 0.5 MG PO TABS: 0.5000 mg | ORAL_TABLET | Freq: Every day | ORAL | Status: DC

## 2015-06-04 MED ORDER — SODIUM CHLORIDE 0.9 % IV SOLN
INTRAVENOUS | Status: DC
  Administered 2015-06-04 – 2015-06-07 (×4): via INTRAVENOUS

## 2015-06-04 MED ORDER — CLONAZEPAM 0.5 MG PO TABS
0.2500 mg | ORAL_TABLET | Freq: Once | ORAL | Status: AC
  Administered 2015-06-04: 0.25 mg via ORAL
  Filled 2015-06-04: qty 1

## 2015-06-04 MED ORDER — CLONAZEPAM 0.5 MG PO TABS
0.5000 mg | ORAL_TABLET | Freq: Every evening | ORAL | Status: DC | PRN
  Administered 2015-06-04 – 2015-06-06 (×2): 0.5 mg via ORAL
  Filled 2015-06-04 (×3): qty 1

## 2015-06-04 MED ORDER — MENTHOL 3 MG MT LOZG
1.0000 | LOZENGE | OROMUCOSAL | Status: DC | PRN
  Administered 2015-06-06: 3 mg via ORAL
  Filled 2015-06-04 (×2): qty 9

## 2015-06-04 MED ORDER — DEXTROSE 5 % IV SOLN
500.0000 mg | Freq: Once | INTRAVENOUS | Status: AC
  Administered 2015-06-04: 500 mg via INTRAVENOUS
  Filled 2015-06-04: qty 500

## 2015-06-04 MED ORDER — PROPRANOLOL HCL 40 MG PO TABS
40.0000 mg | ORAL_TABLET | Freq: Two times a day (BID) | ORAL | Status: DC
  Administered 2015-06-04 (×2): 40 mg via ORAL
  Filled 2015-06-04 (×4): qty 1

## 2015-06-04 MED ORDER — SACCHAROMYCES BOULARDII 250 MG PO CAPS
250.0000 mg | ORAL_CAPSULE | Freq: Two times a day (BID) | ORAL | Status: DC
  Administered 2015-06-04 – 2015-06-07 (×7): 250 mg via ORAL
  Filled 2015-06-04 (×8): qty 1

## 2015-06-04 MED ORDER — OXYMETAZOLINE HCL 0.05 % NA SOLN
1.0000 | Freq: Two times a day (BID) | NASAL | Status: DC
  Administered 2015-06-04: 1 via NASAL
  Filled 2015-06-04: qty 15

## 2015-06-04 MED ORDER — MOMETASONE FURO-FORMOTEROL FUM 100-5 MCG/ACT IN AERO
2.0000 | INHALATION_SPRAY | Freq: Two times a day (BID) | RESPIRATORY_TRACT | Status: DC
  Administered 2015-06-04 – 2015-06-07 (×7): 2 via RESPIRATORY_TRACT
  Filled 2015-06-04: qty 8.8

## 2015-06-04 MED ORDER — LINAGLIPTIN 5 MG PO TABS
5.0000 mg | ORAL_TABLET | Freq: Every day | ORAL | Status: DC
  Administered 2015-06-04 – 2015-06-07 (×4): 5 mg via ORAL
  Filled 2015-06-04 (×4): qty 1

## 2015-06-04 NOTE — Progress Notes (Addendum)
[facility] Progress Note   Hayley [name]  ENM:[id number]  DOB: 09/16/1946  DOA: 06/03/2015 PCP: [name], FNP  Brief narrative: Hayley [name] is a 69 y.o. female DM 2, struck his sleep apnea, anxiety and depression who presents with cough and shortness of breath which is been going on for about a week. It started with sinus drainage and a sore throat and turned into a cough which is productive of yellow colored sputum. She was sent to the ER by her PCP as it was suspected she may have pneumonia. She is also complaining of nausea but no vomiting. She was not found to have a pneumonia on imaging however, was found to be quite hypoxic with a pulse ox in the 80s and was admitted. Apparently she was wheezing as well.  Subjective:  Continues to have a cough. Not short of breath at rest-she was not ambulating much at home was short of breath mostly when ambulating. No longer nauseated. No abdominal pain diarrhea or constipation. No chest pain.  Assessment/Plan: Principal Problem:   Acute respiratory failure with hypoxia-  -Suspect this is due to acute bronchitis with reactive airway disease -Given Solu-Medrol yesterday-no longer wheezing-will hold off on continuing steroid-induced and resume delay her inhaler -Start Z-Pak for acute bronchitis -When necessary nebulizer treatments -Continue oxygen as needed- pulse ox screen ordered -Flonase and Afrin for heavy sinus discharge -congestion  Active Problems:   OSA on CPAP -C Pap ordered  PE -occuring in relation to knee replacement in march 2016- Ct 3/24 was positive for LUL and RLL PEs - previous history of DVT and PE after an appendectomy in 2014 -  She has been on Pradaxa for greater than 6 months-CT angiogram is negative for acute PE - check d dimer- - will ask for a hematology eval     Hypothyroidism -Continue levothyroxine    Diabetes mellitus without complication -Started for gentle-continue sliding scale as sugars  elevated from steroids  HTN - cont Propranolol   Chronic back pain- c spine and l spine - cont Nucynta  Code Status:     Code Status Orders        Start     Ordered   06/03/15 2153  Full code   Continuous     06/03/15 2152    Advance Directive Documentation        Most Recent Value   Type of Advance Directive  Living will   Pre-existing out of facility DNR order (yellow form or pink MOST form)     "MOST" Form in Place?       Family Communication:  Disposition Plan: home when improved DVT prophylaxis: Pradaxa Consultants:none Procedures:none  Antibiotics: Anti-infectives    Start     Dose/Rate Route Frequency Ordered Stop   06/04/15 1000  azithromycin (ZITHROMAX) 500 mg in dextrose 5 % 250 mL IVPB     500 mg 250 mL/hr over 60 Minutes Intravenous  Once 06/04/15 0925 06/04/15 1115      Objective: Filed Weights   06/03/15 1129  Weight: 94.348 kg (208 lb)    Intake/Output Summary (Last 24 hours) at 06/04/15 1455 Last data filed at 06/04/15 1400  Gross per 24 hour  Intake   2680 ml  Output      0 ml  Net   2680 ml     Vitals Filed Vitals:   06/04/15 0549 06/04/15 0802 06/04/15 1047 06/04/15 1132  BP: 91/57  133/62   Pulse: 72  89  Temp: 98 F (36.7 C)     TempSrc: Oral     Resp: 18     Height:      Weight:      SpO2: 96% 95%  91%    Exam:  General:  Pt is alert, not in acute distress  HEENT: No icterus, No thrush, oral mucosa moist  Cardiovascular: regular rate and rhythm, S1/S2 No murmur  Respiratory: clear to auscultation bilaterally   Abdomen: Soft, +Bowel sounds, non tender, non distended, no guarding  MSK: No LE edema, cyanosis or clubbing  Data Reviewed: Basic Metabolic Panel:  Recent Labs Lab 06/03/15 1233 06/04/15 0620  NA 138 140  K 4.4 4.0  CL 102 109  CO2 27 26  GLUCOSE 92 149*  BUN 12 18  CREATININE 0.65 0.61  CALCIUM 9.1 8.8*   Liver Function Tests:  Recent Labs Lab 06/03/15 1233  AST 21  ALT 14   ALKPHOS 92  BILITOT 0.7  PROT 7.8  ALBUMIN 3.9   No results for input(s): LIPASE, AMYLASE in the last 168 hours. No results for input(s): AMMONIA in the last 168 hours. CBC:  Recent Labs Lab 06/03/15 1233 06/04/15 0620  WBC 8.3 10.2  NEUTROABS 5.7  --   HGB 12.6 11.2*  HCT 40.7 36.4  MCV 80.0 78.6  PLT 281 267   Cardiac Enzymes: No results for input(s): CKTOTAL, CKMB, CKMBINDEX, TROPONINI in the last 168 hours. BNP (last 3 results)  Recent Labs  12/09/14 1858  BNP 46.7    ProBNP (last 3 results) No results for input(s): PROBNP in the last 8760 hours.  CBG:  Recent Labs Lab 06/03/15 2230 06/04/15 0745 06/04/15 1317  GLUCAP 211* 131* 278*    No results found for this or any previous visit (from the past 240 hour(s)).   Studies: Dg Chest 2 View  06/03/2015   CLINICAL DATA:  Right-sided chest pain, shortness of breath, cough and congestion 5 days.  EXAM: CHEST  2 VIEW  COMPARISON:  10/26/2014  FINDINGS: Lungs are adequately inflated without consolidation or effusion. Cardiomediastinal silhouette is within normal. There is mild degenerative change of the spine.  IMPRESSION: No active cardiopulmonary disease.   Electronically Signed   By: Marin Olp M.D.   On: 06/03/2015 12:50   Ct Angio Chest Pe W/cm &/or Wo Cm  06/03/2015   CLINICAL DATA:  Shortness of breath and hypoxia  EXAM: CT ANGIOGRAPHY CHEST WITH CONTRAST  TECHNIQUE: Multidetector CT imaging of the chest was performed using the standard protocol during bolus administration of intravenous contrast. Multiplanar CT image reconstructions and MIPs were obtained to evaluate the vascular anatomy.  CONTRAST:  157mL OMNIPAQUE IOHEXOL 350 MG/ML SOLN  COMPARISON:  12/09/2014  FINDINGS: THORACIC INLET/BODY WALL:  No acute finding.  Bilateral sub glandular breast implant.  MEDIASTINUM:  Normal heart size. No pericardial effusion. Atherosclerosis, including along the proximal LAD. No evidence of acute pulmonary embolism.  Filling defects seen previously have resolved. Tortuous but non aneurysmal aorta. No evidence of acute aortic pathology.  LUNG WINDOWS:  Subsegmental bibasilar atelectasis or scarring. No consolidation. No effusion. No suspicious pulmonary nodule.  UPPER ABDOMEN:  No acute findings.  OSSEOUS:  Diffuse disc narrowing and endplate spurring. No acute osseous finding.  Review of the MIP images confirms the above findings.  IMPRESSION: 1. No evidence of acute pulmonary embolism. Emboli seen 12/09/2014 have resolved. 2. Subsegmental atelectasis or scarring at the bases.   Electronically Signed   By: Neva Seat.D.  On: 06/03/2015 16:58    Scheduled Meds:  Scheduled Meds: . clonazePAM  0.5 mg Oral QHS  . cyclobenzaprine  10 mg Oral QHS  . dabigatran  150 mg Oral BID  . gabapentin  1,600 mg Oral QHS  . gabapentin  800 mg Oral Daily  . insulin aspart  0-5 Units Subcutaneous QHS  . insulin aspart  0-9 Units Subcutaneous TID WC  . ipratropium-albuterol  3 mL Nebulization TID  . levothyroxine  25 mcg Oral QAC breakfast  . linagliptin  5 mg Oral Daily  . mometasone-formoterol  2 puff Inhalation BID  . oxymetazoline  1 spray Each Nare BID  . pantoprazole  40 mg Oral Daily  . propranolol  40 mg Oral BID  . saccharomyces boulardii  250 mg Oral BID  . sertraline  100 mg Oral Daily  . sodium chloride  3 mL Intravenous Q12H   Continuous Infusions: . sodium chloride 75 mL/hr at 06/04/15 1136    Time spent on care of this patient: 35 min   [place], MD 06/04/2015, 2:55 PM  LOS: 1 day   [facility] Office  [phone] Pager - Text Page per [email or web address] If 7PM-7AM, please contact night-coverage [email or web address]

## 2015-06-04 NOTE — Progress Notes (Signed)
Called to patient room per her request. She feels the CPAP has "pushed" her nasal congestion down into her lungs and feels anxious. Education provided. Patient now comfortable but has decided to wait another night for her congestion to "clear up some" before using CPAP during sleep. Equipment remains at the bedside, ready for use. RN aware.

## 2015-06-05 ENCOUNTER — Other Ambulatory Visit: Payer: Self-pay | Admitting: Oncology

## 2015-06-05 DIAGNOSIS — J9601 Acute respiratory failure with hypoxia: Secondary | ICD-10-CM

## 2015-06-05 LAB — GLUCOSE, CAPILLARY
Glucose-Capillary: 96 mg/dL (ref 65–99)
Glucose-Capillary: 144 mg/dL — ABNORMAL HIGH (ref 65–99)
Glucose-Capillary: 153 mg/dL — ABNORMAL HIGH (ref 65–99)
Glucose-Capillary: 141 mg/dL — ABNORMAL HIGH (ref 65–99)
Glucose-Capillary: 113 mg/dL — ABNORMAL HIGH (ref 65–99)

## 2015-06-05 MED ORDER — SENNA 8.6 MG PO TABS: 2.0000 | ORAL_TABLET | Freq: Every evening | ORAL | Status: DC | PRN

## 2015-06-05 MED ORDER — FLUTICASONE PROPIONATE 50 MCG/ACT NA SUSP
2.0000 | Freq: Every day | NASAL | Status: DC
  Administered 2015-06-05 – 2015-06-07 (×3): 2 via NASAL
  Filled 2015-06-05: qty 16

## 2015-06-05 MED ORDER — MENTHOL 3 MG MT LOZG: 1.0000 | LOZENGE | OROMUCOSAL | Status: DC | PRN

## 2015-06-05 MED ORDER — PROPRANOLOL HCL 20 MG PO TABS
20.0000 mg | ORAL_TABLET | Freq: Two times a day (BID) | ORAL | Status: DC
Start: 2015-06-05 — End: 2015-06-07
  Administered 2015-06-05 – 2015-06-07 (×4): 20 mg via ORAL
  Filled 2015-06-05 (×5): qty 1

## 2015-06-05 MED ORDER — BISACODYL 5 MG PO TBEC: 10.0000 mg | DELAYED_RELEASE_TABLET | Freq: Once | ORAL | Status: DC

## 2015-06-05 MED ORDER — PHENOL 1.4 % MT LIQD
1.0000 | OROMUCOSAL | Status: DC | PRN
  Administered 2015-06-05: 1 via OROMUCOSAL
  Filled 2015-06-05: qty 177

## 2015-06-05 NOTE — Progress Notes (Signed)
Re:  Desaturation screen for home O2.  Pt on RA > 1 hour, O2 sats 91%.  Upon standing from bed, decreased to 85%, but increased to 90% within a minute.  Pt ambulated x 120' with continuous pulse ox monitoring.  Pulse ox dipped to 88% x 3, but quickly returned to above 90%.  Initially sats averaging around 92%, but staying at 94-95% (on RA) after ambulation completed.  Pt c/o persistent lightheadness while OOB.  Had pt stand > 3 minutes before beginning to ambulate; gait slow, but steady holding onto IV pole with CGA from RN.  Pt reported "my knees feel weak" and verbalized concerns about going home (as she lives alone), if she were d/c today.  Pt's BP when returned to bed:  108/50.  HR while ambulating remained in the 80s.  Spoke with Dr. Wynelle Cleveland ref results of testing.  She requested pt ambulate this evening and tomorrow without O2 to further assess pt's status.  Will update pm nurse with pt's status.

## 2015-06-05 NOTE — Progress Notes (Signed)
Utilization Review Completed.Dowell, Deborah T9/18/2016  

## 2015-06-05 NOTE — Progress Notes (Signed)
2020  I walked with pt in the hall without oxygen.  Her oxygen sats ranged from 90-93%. She c/o feeling "swimming headed". Took BP, was 114/59. Will continue to monitor.

## 2015-06-05 NOTE — Progress Notes (Addendum)
[facility] Progress Note   MINIYA [name]  ZOX:[id number]  DOB: 1946-07-05  DOA: 06/03/2015 PCP: [name], FNP  Brief narrative: Hayley [name] is a 69 y.o. female DM 2, struck his sleep apnea, anxiety and depression who presents with cough and shortness of breath which is been going on for about a week. It started with sinus drainage and a sore throat and turned into a cough which is productive of yellow colored sputum. She was sent to the ER by her PCP as it was suspected she may have pneumonia. She is also complaining of nausea but no vomiting. She was not found to have a pneumonia on imaging however, was found to be quite hypoxic with a pulse ox in the 80s and was admitted. Apparently she was wheezing as well.  Subjective:  Continues to have a cough with yellow sputum- quite weak when ambulating. No nausea vomiting or abdominal pain. No dyspnea on exertion.   Assessment/Plan: Principal Problem:   Acute respiratory failure with hypoxia-  -Suspect this is due to acute bronchitis with reactive airway disease -Given Solu-Medrol on admission-no longer wheezing-will hold off on continuing steroids - cont Dulera -Start Z-Pak for acute bronchitis -When necessary nebulizer treatments -Continue oxygen as needed- pulse ox screen ordered- pulse ox dropping to 88% and becoming dizzy when ambulating -Flonase and Afrin for heavy sinus discharge & congestion  Active Problems:   OSA on CPAP -C Pap ordered  PE -occuring in relation to knee replacement in march 2016- Ct 3/24 was positive for LUL and RLL PEs - previous history of DVT and PE after an appendectomy in 2014 -  She has been on Pradaxa for greater than 6 months-CT angiogram is negative for acute PE  - checked dimer which is slightly elevated- -hematology eval requested- recommended to "continue dabigratan but follow DDimers Q 3 months and consider stopping pradaxa when we have 2 sequential normal readings" - - hypercoagulable  w/u ordered- Dr [name] will set oupt outpt follow up for her    Hypothyroidism -Continue levothyroxine    Diabetes mellitus without complication -Started trajendta-continue sliding scale    Tremors - cont Propranolol but dose cut down from 40 mg BID to 20 mg BID due to hypotension  Chronic back pain- C spine and L spine - cont Nucynta  Code Status:     Code Status Orders        Start     Ordered   06/03/15 2153  Full code   Continuous     06/03/15 2152    Advance Directive Documentation        Most Recent Value   Type of Advance Directive  Living will   Pre-existing out of facility DNR order (yellow form or pink MOST form)     "MOST" Form in Place?       Family Communication:  Disposition Plan: home when improved- possibly tomorrow DVT prophylaxis: Pradaxa Consultants:none Procedures:none  Antibiotics: Anti-infectives    Start     Dose/Rate Route Frequency Ordered Stop   06/04/15 1000  azithromycin (ZITHROMAX) 500 mg in dextrose 5 % 250 mL IVPB     500 mg 250 mL/hr over 60 Minutes Intravenous  Once 06/04/15 0925 06/04/15 1115      Objective: Filed Weights   06/03/15 1129  Weight: 94.348 kg (208 lb)    Intake/Output Summary (Last 24 hours) at 06/05/15 0838 Last data filed at 06/05/15 0600  Gross per 24 hour  Intake   1500  ml  Output   1700 ml  Net   -200 ml     Vitals Filed Vitals:   06/04/15 1400 06/04/15 1944 06/04/15 2147 06/05/15 0444  BP: 110/61  103/60 98/65  Pulse: 80  82 71  Temp: 98.8 F (37.1 C)  98.4 F (36.9 C) 98 F (36.7 C)  TempSrc: Oral  Oral Oral  Resp: 18  17   Height:      Weight:      SpO2: 91% 94% 93% 96%    Exam:  General:  Pt is alert, not in acute distress  HEENT: No icterus, No thrush, oral mucosa moist  Cardiovascular: regular rate and rhythm, S1/S2 No murmur  Respiratory: clear to auscultation bilaterally   Abdomen: Soft, +Bowel sounds, non tender, non distended, no guarding  MSK: No LE edema,  cyanosis or clubbing  Data Reviewed: Basic Metabolic Panel:  Recent Labs Lab 06/03/15 1233 06/04/15 0620  NA 138 140  K 4.4 4.0  CL 102 109  CO2 27 26  GLUCOSE 92 149*  BUN 12 18  CREATININE 0.65 0.61  CALCIUM 9.1 8.8*   Liver Function Tests:  Recent Labs Lab 06/03/15 1233  AST 21  ALT 14  ALKPHOS 92  BILITOT 0.7  PROT 7.8  ALBUMIN 3.9   No results for input(s): LIPASE, AMYLASE in the last 168 hours. No results for input(s): AMMONIA in the last 168 hours. CBC:  Recent Labs Lab 06/03/15 1233 06/04/15 0620  WBC 8.3 10.2  NEUTROABS 5.7  --   HGB 12.6 11.2*  HCT 40.7 36.4  MCV 80.0 78.6  PLT 281 267   Cardiac Enzymes: No results for input(s): CKTOTAL, CKMB, CKMBINDEX, TROPONINI in the last 168 hours. BNP (last 3 results)  Recent Labs  12/09/14 1858  BNP 46.7    ProBNP (last 3 results) No results for input(s): PROBNP in the last 8760 hours.  CBG:  Recent Labs Lab 06/04/15 0745 06/04/15 1317 06/04/15 1700 06/04/15 2158 06/05/15 0816  GLUCAP 131* 278* 201* 152* 96    No results found for this or any previous visit (from the past 240 hour(s)).   Studies: Dg Chest 2 View  06/03/2015   CLINICAL DATA:  Right-sided chest pain, shortness of breath, cough and congestion 5 days.  EXAM: CHEST  2 VIEW  COMPARISON:  10/26/2014  FINDINGS: Lungs are adequately inflated without consolidation or effusion. Cardiomediastinal silhouette is within normal. There is mild degenerative change of the spine.  IMPRESSION: No active cardiopulmonary disease.   Electronically Signed   By: Marin Olp M.D.   On: 06/03/2015 12:50   Ct Angio Chest Pe W/cm &/or Wo Cm  06/03/2015   CLINICAL DATA:  Shortness of breath and hypoxia  EXAM: CT ANGIOGRAPHY CHEST WITH CONTRAST  TECHNIQUE: Multidetector CT imaging of the chest was performed using the standard protocol during bolus administration of intravenous contrast. Multiplanar CT image reconstructions and MIPs were obtained to  evaluate the vascular anatomy.  CONTRAST:  156mL OMNIPAQUE IOHEXOL 350 MG/ML SOLN  COMPARISON:  12/09/2014  FINDINGS: THORACIC INLET/BODY WALL:  No acute finding.  Bilateral sub glandular breast implant.  MEDIASTINUM:  Normal heart size. No pericardial effusion. Atherosclerosis, including along the proximal LAD. No evidence of acute pulmonary embolism. Filling defects seen previously have resolved. Tortuous but non aneurysmal aorta. No evidence of acute aortic pathology.  LUNG WINDOWS:  Subsegmental bibasilar atelectasis or scarring. No consolidation. No effusion. No suspicious pulmonary nodule.  UPPER ABDOMEN:  No acute findings.  OSSEOUS:  Diffuse disc narrowing and endplate spurring. No acute osseous finding.  Review of the MIP images confirms the above findings.  IMPRESSION: 1. No evidence of acute pulmonary embolism. Emboli seen 12/09/2014 have resolved. 2. Subsegmental atelectasis or scarring at the bases.   Electronically Signed   By: Monte Fantasia M.D.   On: 06/03/2015 16:58    Scheduled Meds:  Scheduled Meds: . bisacodyl  10 mg Oral Once  . cyclobenzaprine  10 mg Oral QHS  . dabigatran  150 mg Oral BID  . fluticasone  2 spray Each Nare Daily  . gabapentin  1,600 mg Oral QHS  . gabapentin  800 mg Oral Daily  . insulin aspart  0-5 Units Subcutaneous QHS  . insulin aspart  0-9 Units Subcutaneous TID WC  . ipratropium-albuterol  3 mL Nebulization TID  . levothyroxine  25 mcg Oral QAC breakfast  . linagliptin  5 mg Oral Daily  . mometasone-formoterol  2 puff Inhalation BID  . oxymetazoline  1 spray Each Nare BID  . pantoprazole  40 mg Oral Daily  . propranolol  40 mg Oral BID  . saccharomyces boulardii  250 mg Oral BID  . sertraline  100 mg Oral Daily  . sodium chloride  3 mL Intravenous Q12H   Continuous Infusions: . sodium chloride 75 mL/hr at 06/04/15 1516    Time spent on care of this patient: 35 min   [place], MD 06/05/2015, 8:38 AM  LOS: 2 days   [facility] Office  [phone] Pager - Text Page per [email or web address] If 7PM-7AM, please contact night-coverage [email or web address]

## 2015-06-05 NOTE — Consult Note (Signed)
[place]  [phone] [phone]     ID: ZAMORA COLTON DOB: Jul 10, 1946  MR#: [id number]  JME#:[id number]  Patient Care Team: [name], FNP as PCP - General (Internal Medicine) Juanita Craver, MD as Consulting Physician (Gastroenterology) Daryll Brod, MD as Consulting Physician (Orthopedic Surgery) PCP: [name], FNP OTHER MD: Michael Boston, Gaynelle Arabian  CHIEF COMPLAINT: recurrent PE  CURRENT TREATMENT: dabigratan/ Pradaxa  HISTORY OF PRESENT ILLNESS: Ms Brame underwent laparoscopic appendectomy under Dr Johney [place] 11/27/2014 for what was likely a perforated appendix with phlegmon formation. On 12/06/2014 she was noted to be hypoxic and a chest CT/angio was obtained, showing multiple small bilateral PEs. LE dopplers that day were negative. UE dopplers the next day showed only a R cephalic v superficial thrombus.   The patient was started on warfarin, continued for one year. Repeat CT/angio 12/22/2013 showed resolution of the earlier clot.  On 12/06/2014 the patient underwent R TKR. On 12/09/2014 she was noted to be SOB and CT/angio documented bilateral PEs. She was started on Xarelto, later changed by her PCP to Pradaxa. Repeat CT angios 05/15/2014 and 06/03/2015 showed resolution of the earlier clots.  We are consulted regarding further evaluation and treatment, specifically recommended length of anticoagulation  INTERVAL HISTORY: The patient was admitted 06/03/2015 with worsening SOB/ wheezing. This is felt to be secondary to acute bronchitis/ reactive airway disease. Pt uses C-PAP at home. I do not find a prior PUL evaluation.   I met with the patient in her hospital room 06/05/2015  REVIEW OF SYSTEMS: She is tolerating the dabigratan/Pradaxa w/o bleeding complications. She has to pay about $200 out of pocket month ly for this medication. She did not like the warfarin, though it was much less expensive, chiefly because of difficulties  in monitoring. She has not had any bleeding complications from her anticoagulant therapy. Currently she feels her breathing is better, denies any fever, purulent or bloody sputum, unusula headaches, visual changes, N/V or balance problems. Denies epistaxis, melena or BRBPR or hematuria. A detailed ROS was otherwise not relevalt   PAST MEDICAL HISTORY: Past Medical History  Diagnosis Date  . Hyperthyroidism   . Toxic goiter   . Reflux   . Colon polyps   . Migraines   . 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 Obesity, Class III, BMI 40-49.9 (morbid obesity) 04/27/2010    02-17-14 reports some weight loss- intentional  . Pelvic pain     02-17-14 "states thinks its back related on left groin"  . Anemia   . Clotting disorder 02-17-14    S/p appendectomy-developed Pulmonary emboli-tx. warfarin-d/c 1 yr ago.  . Osteoarthritis     in back(chronic pain)  . Pulmonary emboli     s/p Appendectomy '13 [facility]  . Diabetes mellitus without complication     "borderline"- oral med  . Right knee pain   . Sleep apnea     cpap - settings at 4   . Bronchitis     hx of   . Anxiety   . Depression   . Kidney stones   . Neuromuscular disorder     hands and throat"spasmodic dysphonia", tremors - thimb and forefinger   . Nausea Nov 15, 2014    cycle of migraine headaches and nausea    PAST SURGICAL HISTORY: Past Surgical History  Procedure Laterality Date  . Abdominal hysterectomy    . Zenkers diverticulum    .  Kidney stone surgery    . Laparoscopic appendectomy  11/27/2011    Procedure: APPENDECTOMY LAPAROSCOPIC;  Surgeon: Adin Hector, MD;  Location: [facility];  Service: General;  Laterality: N/A;  . Cholecystectomy    . Knee arthroscopy  1998    right  . Hand surgery  2004    both hands  . Botox injection      for migranes  . Cataract extraction, bilateral Bilateral 03/2013   . Vocal cord surgery  02-17-14    01-12-14 done in [place] [facility](s/p  selective denervation/reinnervation recurrent laryngeal nerve surgery)  . Appendectomy      3'13 [facility] s/p developed  Pulmonary Emboli  . Total knee revision Left 02/24/2014    Procedure: LEFT TOTAL KNEE REVISION;  Surgeon: Gearlean Alf, MD;  Location: [facility];  Service: Orthopedics;  Laterality: Left;  . Total knee arthroplasty Right 12/06/2014    Procedure: RIGHT TOTAL KNEE ARTHROPLASTY;  Surgeon: Gaynelle Arabian, MD;  Location: [facility];  Service: Orthopedics;  Laterality: Right;    FAMILY HISTORY Family History  Problem Relation Age of Onset  . Heart disease Mother   . Pneumonia Father   . Cirrhosis Father   . Hypertension Father   . Heart attack Paternal Grandfather   . Heart attack Maternal Grandfather   . Heart attack Paternal Grandmother   . Heart attack Maternal Grandmother   . 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 The patient has 2 children from an earlier marriage, Corene Cornea, who lives in [place], and a daughter [name], who manages a Building surveyor. The patient has one grandchild of her own, 32 with Arnell Sieving (who has 3 children from a prior marriage)     HEALTH MAINTENANCE: Social History  Substance Use Topics  . Smoking status: Former Smoker    Quit date: 01/03/1982  . Smokeless tobacco: Never Used  . Alcohol Use: No      Allergies  Allergen Reactions  . Nutritional Supplements Anaphylaxis    Pt does not remember this happening  . Tape Rash    Rash with paper tape  . Bee Venom     unknown  . Etodolac Nausea And Vomiting  . Morphine Other (See Comments)    Hallucinations  . Other Itching and Swelling  . Propoxyphene   . Propoxyphene N-Acetaminophen Nausea And Vomiting  . Penicillins Rash    Current Facility-Administered Medications  Medication Dose Route Frequency Provider Last Rate Last Dose  . 0.9 %  sodium chloride infusion   250 mL Intravenous PRN Theressa Millard, MD      . 0.9 %  sodium chloride infusion   Intravenous Continuous Debbe Odea, MD 75 mL/hr at 06/04/15 1516    . acetaminophen (TYLENOL) tablet 650 mg  650 mg Oral Q6H PRN Theressa Millard, MD       Or  . acetaminophen (TYLENOL) suppository 650 mg  650 mg Rectal Q6H PRN Harvette Evonnie Dawes, MD      . albuterol (PROVENTIL) (2.5 MG/3ML) 0.083% nebulizer solution 2.5 mg  2.5 mg Nebulization Q6H PRN Theressa Millard, MD      . alum & mag hydroxide-simeth (MAALOX/MYLANTA) 200-200-20 MG/5ML suspension 30 mL  30 mL Oral Q6H PRN Theressa Millard, MD      . bisacodyl (DULCOLAX) EC tablet 10 mg  10 mg Oral Once  Debbe Odea, MD      . clonazePAM Bobbye [place]) tablet 0.5 mg  0.5 mg Oral QHS PRN Debbe Odea, MD   0.5 mg at 06/04/15 2225  . cyclobenzaprine (FLEXERIL) tablet 10 mg  10 mg Oral QHS Theressa Millard, MD   10 mg at 06/04/15 2226  . dabigatran (PRADAXA) capsule 150 mg  150 mg Oral BID Debbe Odea, MD   150 mg at 06/04/15 2225  . fluorometholone (FML) 0.1 % ophthalmic suspension 1 drop  1 drop Both Eyes Daily PRN Theressa Millard, MD      . fluticasone (FLONASE) 50 MCG/ACT nasal spray 2 spray  2 spray Each Nare Daily Saima Rizwan, MD      . gabapentin (NEURONTIN) capsule 1,600 mg  1,600 mg Oral QHS Theressa Millard, MD   1,600 mg at 06/04/15 2225  . gabapentin (NEURONTIN) capsule 800 mg  800 mg Oral Daily Theressa Millard, MD   800 mg at 06/04/15 1014  . insulin aspart (novoLOG) injection 0-5 Units  0-5 Units Subcutaneous QHS Theressa Millard, MD   2 Units at 06/03/15 2248  . insulin aspart (novoLOG) injection 0-9 Units  0-9 Units Subcutaneous TID WC Theressa Millard, MD   3 Units at 06/04/15 1742  . levothyroxine (SYNTHROID, LEVOTHROID) tablet 25 mcg  25 mcg Oral QAC breakfast Theressa Millard, MD   25 mcg at 06/05/15 0820  . linagliptin (TRADJENTA) tablet 5 mg  5 mg Oral Daily Debbe Odea, MD   5 mg at 06/04/15 1014  .  menthol-cetylpyridinium (CEPACOL) lozenge 3 mg  1 lozenge Oral PRN Theressa Millard, MD      . mometasone-formoterol (DULERA) 100-5 MCG/ACT inhaler 2 puff  2 puff Inhalation BID Debbe Odea, MD   2 puff at 06/04/15 1943  . ondansetron (ZOFRAN) tablet 4 mg  4 mg Oral Q6H PRN Theressa Millard, MD       Or  . ondansetron [facility]) injection 4 mg  4 mg Intravenous Q6H PRN Theressa Millard, MD   4 mg at 06/04/15 2154  . oxymetazoline (AFRIN) 0.05 % nasal spray 1 spray  1 spray Each Nare BID Debbe Odea, MD   1 spray at 06/04/15 1015  . pantoprazole (PROTONIX) EC tablet 40 mg  40 mg Oral Daily Theressa Millard, MD   40 mg at 06/04/15 1014  . propranolol (INDERAL) tablet 40 mg  40 mg Oral BID Debbe Odea, MD   40 mg at 06/04/15 2225  . saccharomyces boulardii (FLORASTOR) capsule 250 mg  250 mg Oral BID Debbe Odea, MD   250 mg at 06/04/15 2226  . senna (SENOKOT) tablet 17.2 mg  2 tablet Oral QHS PRN Debbe Odea, MD      . sertraline (ZOLOFT) tablet 100 mg  100 mg Oral Daily Theressa Millard, MD   100 mg at 06/04/15 1014  . sodium chloride 0.9 % injection 3 mL  3 mL Intravenous Q12H Theressa Millard, MD   3 mL at 06/03/15 2224  . sodium chloride 0.9 % injection 3 mL  3 mL Intravenous PRN Theressa Millard, MD      . SUMAtriptan (IMITREX) tablet 25 mg  25 mg Oral Q2H PRN Theressa Millard, MD      . tapentadol (NUCYNTA) tablet 50 mg  50 mg Oral TID PRN Theressa Millard, MD   50 mg at 06/05/15 0143    OBJECTIVE: middle aged [place] woman examined in bed [place]  Vitals:   06/05/15 0444  BP: 98/65  Pulse: 71  Temp: 98 F (36.7 C)  Resp:      Body mass index is 33.07 kg/(m^2).      LAB RESULTS:  CMP     Component Value Date/Time   NA 140 06/04/2015 0620   K 4.0 06/04/2015 0620   CL 109 06/04/2015 0620   CO2 26 06/04/2015 0620   GLUCOSE 149* 06/04/2015 0620   BUN 18 06/04/2015 0620   CREATININE 0.61 06/04/2015 0620   CALCIUM 8.8* 06/04/2015 0620   PROT 7.8 06/03/2015 1233     ALBUMIN 3.9 06/03/2015 1233   AST 21 06/03/2015 1233   ALT 14 06/03/2015 1233   ALKPHOS 92 06/03/2015 1233   BILITOT 0.7 06/03/2015 1233   GFRNONAA >60 06/04/2015 0620   GFRAA >60 06/04/2015 0620    INo results found for: SPEP, UPEP  Lab Results  Component Value Date   WBC 10.2 06/04/2015   NEUTROABS 5.7 06/03/2015   HGB 11.2* 06/04/2015   HCT 36.4 06/04/2015   MCV 78.6 06/04/2015   PLT 267 06/04/2015    @LASTCHEMISTRY @  No results found for: LABCA2  No components found for: LABCA125  No results for input(s): INR in the last 168 hours.  Urinalysis    Component Value Date/Time   COLORURINE ORANGE 12/19/2014 0730   COLORURINE YELLOW 11/30/2014 1215   APPEARANCEUR CLOUDY 12/19/2014 0730   APPEARANCEUR CLEAR 11/30/2014 1215   LABSPEC 1.025 12/19/2014 0730   LABSPEC 1.019 11/30/2014 1215   PHURINE see comment 12/19/2014 0730   PHURINE 7.5 11/30/2014 1215   GLUCOSEU see comment 12/19/2014 0730   GLUCOSEU NEGATIVE 11/30/2014 1215   HGBUR see comment 12/19/2014 0730   HGBUR NEGATIVE 11/30/2014 1215   BILIRUBINUR see comment 12/19/2014 0730   BILIRUBINUR NEGATIVE 11/30/2014 1215   KETONESUR see comment 12/19/2014 0730   KETONESUR NEGATIVE 11/30/2014 1215   PROTEINUR see comment 12/19/2014 0730   PROTEINUR NEGATIVE 11/30/2014 1215   UROBILINOGEN 1.0 11/30/2014 1215   NITRITE SEE COMMENT 12/19/2014 0730   NITRITE NEGATIVE 11/30/2014 1215   LEUKOCYTESUR see comment 12/19/2014 0730   LEUKOCYTESUR SMALL* 11/30/2014 1215    STUDIES: Dg Chest 2 View  06/03/2015   CLINICAL DATA:  Right-sided chest pain, shortness of breath, cough and congestion 5 days.  EXAM: CHEST  2 VIEW  COMPARISON:  10/26/2014  FINDINGS: Lungs are adequately inflated without consolidation or effusion. Cardiomediastinal silhouette is within normal. There is mild degenerative change of the spine.  IMPRESSION: No active cardiopulmonary disease.   Electronically Signed   By: Marin Olp M.D.   On:  06/03/2015 12:50   Ct Angio Chest Pe W/cm &/or Wo Cm  06/03/2015   CLINICAL DATA:  Shortness of breath and hypoxia  EXAM: CT ANGIOGRAPHY CHEST WITH CONTRAST  TECHNIQUE: Multidetector CT imaging of the chest was performed using the standard protocol during bolus administration of intravenous contrast. Multiplanar CT image reconstructions and MIPs were obtained to evaluate the vascular anatomy.  CONTRAST:  16m OMNIPAQUE IOHEXOL 350 MG/ML SOLN  COMPARISON:  12/09/2014  FINDINGS: THORACIC INLET/BODY WALL:  No acute finding.  Bilateral sub glandular breast implant.  MEDIASTINUM:  Normal heart size. No pericardial effusion. Atherosclerosis, including along the proximal LAD. No evidence of acute pulmonary embolism. Filling defects seen previously have resolved. Tortuous but non aneurysmal aorta. No evidence of acute aortic pathology.  LUNG WINDOWS:  Subsegmental bibasilar atelectasis or scarring. No consolidation. No effusion. No suspicious pulmonary nodule.  UPPER  ABDOMEN:  No acute findings.  OSSEOUS:  Diffuse disc narrowing and endplate spurring. No acute osseous finding.  Review of the MIP images confirms the above findings.  IMPRESSION: 1. No evidence of acute pulmonary embolism. Emboli seen 12/09/2014 have resolved. 2. Subsegmental atelectasis or scarring at the bases.   Electronically Signed   By: Monte Fantasia M.D.   On: 06/03/2015 16:58    ASSESSMENT: 69 y.o. [place] woman with a history of recurrent PE as follows:  (1) multiple low-volume PE noted on chest CT/angio 12/06/2014, 10 days after laparoscopic appendectomy for a perforated appendix with phlegmon  (a) bilateral LE dopplers 12/06/2014 negative  (b) bilateral UE dopplers 12/07/2014 showed superficial R cephalic v clot  (c) treatment: warfarin x one year, monitored through [facility]  (d) repeat CT angio 12/23/2015 showed prior PEs cleared; also CT/angio 05/15/2014 negative  (2) bilateral PEs documented by Ct/angio03/24/2016,  day 3 post R total knee  (a) started on rivaroxaban/ Xarelto, switched by PCP to dabigratan/ Pradaxa  (b) repeat CT/angio 06/03/2015 shows resolution of prior PE  PLAN: We were asked to comment on how long to anticoagulate this patient and whether a hypercoagulable panel should be sent.   Taking that question first, I believe the chance of a primary hypercoagulable condition is low. The patient has had other surgeries and 2 vaginal deliveries w/o clotting complications. Both her PEs were preceded by surgery. She has obesity as an added risk factor. Nevertheless, if we documented a positive LAC or other strong indicator of baseline hypercoagulability, it would influenceour decision re length of anticoagulation. Accordingly I am requesting the relevant tests.  If the screens for an inherited/acquired hypercoagulable state are negative, we will have to balance the risk or further clots vs the risk of bleeding from NOACs. This is a complex decision because we don't have level 1 data. In this 69 y/o patient my suggestion would be to continue dabigratan but follow DDimers Q 3 months and consider stopping pradaxa when we have 2 sequential normal readings. Note the current DDimer is still mildly elevated.  Patient is also concerned about the cost of her medications. That may prove to be the deciding factor   I will set up appropriate follow-up through our clinic and coordinate with her PCP after discharge. Please let me know if I can be of further help   Chauncey Cruel, MD   06/05/2015 9:07 AM Medical Oncology and Hematology [facility] [address] [place], [place] [address] Tel. [phone]    Fax. [phone]

## 2015-06-06 ENCOUNTER — Telehealth: Payer: Self-pay | Admitting: Oncology

## 2015-06-06 ENCOUNTER — Inpatient Hospital Stay (HOSPITAL_COMMUNITY): Payer: Medicare Other

## 2015-06-06 LAB — CBC
HCT: 36.3 % (ref 36.0–46.0)
Hemoglobin: 11 g/dL — ABNORMAL LOW (ref 12.0–15.0)
MCH: 24.3 pg — ABNORMAL LOW (ref 26.0–34.0)
MCHC: 30.3 g/dL (ref 30.0–36.0)
MCV: 80.3 fL (ref 78.0–100.0)
Platelets: 229 10*3/uL (ref 150–400)
RBC: 4.52 MIL/uL (ref 3.87–5.11)
RDW: 18.4 % — ABNORMAL HIGH (ref 11.5–15.5)
WBC: 7.4 10*3/uL (ref 4.0–10.5)

## 2015-06-06 LAB — GLUCOSE, CAPILLARY
Glucose-Capillary: 99 mg/dL (ref 65–99)
Glucose-Capillary: 118 mg/dL — ABNORMAL HIGH (ref 65–99)
Glucose-Capillary: 123 mg/dL — ABNORMAL HIGH (ref 65–99)
Glucose-Capillary: 189 mg/dL — ABNORMAL HIGH (ref 65–99)

## 2015-06-06 LAB — BASIC METABOLIC PANEL
Anion gap: 7 (ref 5–15)
BUN: 15 mg/dL (ref 6–20)
CO2: 28 mmol/L (ref 22–32)
Calcium: 8.7 mg/dL — ABNORMAL LOW (ref 8.9–10.3)
Chloride: 105 mmol/L (ref 101–111)
Creatinine, Ser: 0.7 mg/dL (ref 0.44–1.00)
GFR calc Af Amer: >60 mL/min (ref >60)
GFR calc non Af Amer: >60 mL/min (ref >60)
Glucose, Bld: 129 mg/dL — ABNORMAL HIGH (ref 65–99)
Potassium: 4.2 mmol/L (ref 3.5–5.1)
Sodium: 140 mmol/L (ref 135–145)

## 2015-06-06 LAB — HEMOGLOBIN A1C
Hgb A1c MFr Bld: 7 % — ABNORMAL HIGH (ref 4.8–5.6)
Mean Plasma Glucose: 154 mg/dL

## 2015-06-06 MED ORDER — AZITHROMYCIN 250 MG PO TABS
250.0000 mg | ORAL_TABLET | Freq: Every day | ORAL | Status: DC
  Administered 2015-06-07: 250 mg via ORAL
  Filled 2015-06-06: qty 1

## 2015-06-06 MED ORDER — PREDNISONE 20 MG PO TABS
40.0000 mg | ORAL_TABLET | Freq: Every day | ORAL | Status: DC
  Administered 2015-06-06 – 2015-06-07 (×2): 40 mg via ORAL
  Filled 2015-06-06 (×3): qty 2

## 2015-06-06 MED ORDER — AZITHROMYCIN 500 MG PO TABS
500.0000 mg | ORAL_TABLET | Freq: Every day | ORAL | Status: AC
  Administered 2015-06-06: 500 mg via ORAL
  Filled 2015-06-06: qty 1

## 2015-06-06 NOTE — Progress Notes (Signed)
SATURATION QUALIFICATIONS: (This note is used to comply with regulatory documentation for home oxygen)  Patient Saturations on Room Air at Rest = 91%  Patient Saturations on Room Air while Ambulating = 86%  Patient Saturations on 1 Liters of oxygen while Ambulating = 97%  Please briefly explain why patient needs home oxygen: to maintain oxygen saturations above 88% during physical activity such as ambulation  Carmelia Bake, PT, DPT 06/06/2015 Pager: [phone]

## 2015-06-06 NOTE — Care Management Important Message (Signed)
Important Message  Patient Details  Name: SHACARRA CHOE MRN: [id number] Date of Birth: 10/23/1945   Medicare Important Message Given:  [facility] notification given    Camillo Flaming 06/06/2015, 11:43 AMImportant Message  Patient Details  Name: KEILA TURAN MRN: [id number] Date of Birth: 04-Dec-1945   Medicare Important Message Given:  Yes-second notification given    Camillo Flaming 06/06/2015, 11:43 AM

## 2015-06-06 NOTE — Progress Notes (Signed)
[facility] Progress Note   Hayley [name]  JME:[id number]  DOB: 16-Feb-1946  DOA: 06/03/2015 PCP: Hayley Commons, FNP  Brief narrative: Hayley [name] is a 69 y.o. female DM 2, struck his sleep apnea, anxiety and depression who presents with cough and shortness of breath which is been going on for about a week. It started with sinus drainage and a sore throat and turned into a cough which is productive of yellow colored sputum. She was sent to the ER by her PCP as it was suspected she may have pneumonia. She is also complaining of nausea but no vomiting. She was not found to have a pneumonia on imaging however, was found to be quite hypoxic with a pulse ox in the 80s and was admitted. Apparently she was wheezing as well.  Subjective: Patient lightheaded with ambulation, oxygen saturation. 90% with ambulation  Assessment/Plan: Principal Problem:   Acute respiratory failure with hypoxia- patient has a history of PE treated with warfarin, now on Pradaxa -Suspect this is due to acute bronchitis with reactive airway disease -Status post Solu-Medrol  no longer wheezing-steroids discontinued - cont Dulera Continue Z-Pak for acute bronchitis -When necessary nebulizer treatments -Continue oxygen as needed- pulse ox screen ordered- pulse ox dropping to 88% and becoming dizzy when ambulating -Flonase and Afrin for heavy sinus discharge & congestion Repeat chest x-ray  Orthostasis Check orthostatic vitals, patient complaining of lightheadedness, continue saline at 75      OSA on CPAP -C Pap ordered  Recurrent DVT/PE -occuring in relation to knee replacement in march 2016- Ct 3/24 was positive for LUL and RLL PEs - previous history of DVT and PE after an appendectomy in 2014 -  She has been on Pradaxa for greater than 6 months-CT angiogram is negative for acute PE Oncology recommends to continue dabigratan but follow DDimers Q 3 months and consider stopping pradaxa when we have 2  sequential normal readings. hypercoagulable w/u ordered- Dr Hayley [name] will set oupt outpt follow up for her    Hypothyroidism -Continue levothyroxine    Diabetes mellitus without complication Continue trajendta-continue sliding scale    Tremors - cont Propranolol but dose cut down from 40 mg BID to 20 mg BID due to hypotension  Chronic back pain- C spine and L spine - cont Nucynta  Code Status:     Code Status Orders        Start     Ordered   06/03/15 2153  Full code   Continuous     06/03/15 2152    Advance Directive Documentation        Most Recent Value   Type of Advance Directive  Living will   Pre-existing out of facility DNR order (yellow form or pink MOST form)     "MOST" Form in Place?       Family Communication:  Disposition Plan: Anticipate discharge tomorrow  DVT prophylaxis: Pradaxa Consultants:none Procedures:none  Antibiotics: Anti-infectives    Start     Dose/Rate Route Frequency Ordered Stop   06/04/15 1000  azithromycin (ZITHROMAX) 500 mg in dextrose 5 % 250 mL IVPB     500 mg 250 mL/hr over 60 Minutes Intravenous  Once 06/04/15 0925 06/04/15 1115      Objective: Filed Weights   06/03/15 1129  Weight: 94.348 kg (208 lb)    Intake/Output Summary (Last 24 hours) at 06/06/15 1227 Last data filed at 06/06/15 1217  Gross per 24 hour  Intake 1501.25 ml  Output  5003 ml  Net -3501.75 ml     Vitals Filed Vitals:   06/05/15 2217 06/06/15 0550 06/06/15 0938 06/06/15 1132  BP: 108/54 118/68 117/51   Pulse: 72 77 77   Temp: 98.6 F (37 C) 98.7 F (37.1 C)    TempSrc: Oral Oral    Resp: 14 16    Height:      Weight:      SpO2: 93% 98%  94%    Exam:  General:  Pt is alert, not in acute distress  HEENT: No icterus, No thrush, oral mucosa moist  Cardiovascular: regular rate and rhythm, S1/S2 No murmur  Respiratory: clear to auscultation bilaterally   Abdomen: Soft, +Bowel sounds, non tender, non distended, no guarding  MSK:  No LE edema, cyanosis or clubbing  Data Reviewed: Basic Metabolic Panel:  Recent Labs Lab 06/03/15 1233 06/04/15 0620 06/06/15 0539  NA 138 140 140  K 4.4 4.0 4.2  CL 102 109 105  CO2 27 26 28   GLUCOSE 92 149* 129*  BUN 12 18 15   CREATININE 0.65 0.61 0.70  CALCIUM 9.1 8.8* 8.7*   Liver Function Tests:  Recent Labs Lab 06/03/15 1233  AST 21  ALT 14  ALKPHOS 92  BILITOT 0.7  PROT 7.8  ALBUMIN 3.9   No results for input(s): LIPASE, AMYLASE in the last 168 hours. No results for input(s): AMMONIA in the last 168 hours. CBC:  Recent Labs Lab 06/03/15 1233 06/04/15 0620 06/06/15 0539  WBC 8.3 10.2 7.4  NEUTROABS 5.7  --   --   HGB 12.6 11.2* 11.0*  HCT 40.7 36.4 36.3  MCV 80.0 78.6 80.3  PLT 281 267 229   Cardiac Enzymes: No results for input(s): CKTOTAL, CKMB, CKMBINDEX, TROPONINI in the last 168 hours. BNP (last 3 results)  Recent Labs  12/09/14 1858  BNP 46.7    ProBNP (last 3 results) No results for input(s): PROBNP in the last 8760 hours.  CBG:  Recent Labs Lab 06/05/15 1634 06/05/15 1827 06/05/15 2249 06/06/15 0759 06/06/15 1205  GLUCAP 153* 141* 113* 99 118*    No results found for this or any previous visit (from the past 240 hour(s)).   Studies: No results found.  Scheduled Meds:  Scheduled Meds: . bisacodyl  10 mg Oral Once  . cyclobenzaprine  10 mg Oral QHS  . dabigatran  150 mg Oral BID  . fluticasone  2 spray Each Nare Daily  . gabapentin  1,600 mg Oral QHS  . gabapentin  800 mg Oral Daily  . insulin aspart  0-5 Units Subcutaneous QHS  . insulin aspart  0-9 Units Subcutaneous TID WC  . levothyroxine  25 mcg Oral QAC breakfast  . linagliptin  5 mg Oral Daily  . mometasone-formoterol  2 puff Inhalation BID  . oxymetazoline  1 spray Each Nare BID  . pantoprazole  40 mg Oral Daily  . propranolol  20 mg Oral BID  . saccharomyces boulardii  250 mg Oral BID  . sertraline  100 mg Oral Daily  . sodium chloride  3 mL  Intravenous Q12H   Continuous Infusions: . sodium chloride 75 mL/hr at 06/05/15 1434    Time spent on care of this patient: 35 min   ABROL,NAYANA, MD 06/06/2015, 12:27 PM  LOS: 3 days   [facility] Office  [phone] Pager - Text Page per [email or web address] If 7PM-7AM, please contact night-coverage [email or web address]

## 2015-06-06 NOTE — Progress Notes (Signed)
Pt stated that she wasn't ready for CPAP and that she would call when she was ready for it.  RN notified, RT to monitor and assess as needed.

## 2015-06-06 NOTE — Evaluation (Signed)
Physical Therapy One Time Evaluation Patient Details Name: LUGENIA ASSEFA MRN: [id number] DOB: 1946-08-06 Today's Date: 06/06/2015   History of Present Illness  69 y.o. female with PMHx of DM 2, obstructive sleep apnea, anxiety and depression, PE, R TKA who presents with cough and shortness of breath which is been going on for about a week. It started with sinus drainage and a sore throat   Admitted for Acute respiratory failure with hypoxia suspected due to acute bronchitis.   Clinical Impression  Patient evaluated by Physical Therapy with no further acute PT needs identified. All education has been completed and the patient has no further questions.  Pt ambulating without difficulty and denies SOB however SPO2 decreased to 86% room air during ambulation.  Pt encouraged to ambulate at least twice more today with nursing staff as she is hopeful to not need oxygen upon discharge home (likely tomorrow). No further follow-up Physical Therapy or equipment needs. PT is signing off. Thank you for this referral.     Follow Up Recommendations No PT follow up    Equipment Recommendations  None recommended by PT    Recommendations for Other Services       Precautions / Restrictions        Mobility  Bed Mobility Overal bed mobility: Modified Independent                Transfers Overall transfer level: Modified independent                  Ambulation/Gait Ambulation/Gait assistance: Supervision;Modified independent (Device/Increase time) Ambulation Distance (Feet): 400 Feet Assistive device: None Gait Pattern/deviations: Step-through pattern     General Gait Details: no unsteady gait or LOB observed, SpO2 dropped to 86% on room air, see saturation qualification progress note  Stairs            Wheelchair Mobility    Modified Rankin (Stroke Patients Only)       Balance Overall balance assessment: No apparent balance deficits (not formally assessed)                                            Pertinent Vitals/Pain Pain Assessment: No/denies pain    Home Living Family/patient expects to be discharged to:: Private residence Living Arrangements: Spouse/significant other   Type of Home: House Home Access: Stairs to enter     Home Layout: Able to live on main level with bedroom/bathroom;Multi-level Home Equipment: Environmental consultant - 2 wheels      Prior Function Level of Independence: Independent               Hand Dominance        Extremity/Trunk Assessment   Upper Extremity Assessment: Overall WFL for tasks assessed           Lower Extremity Assessment: Overall WFL for tasks assessed      Cervical / Trunk Assessment: Normal  Communication   Communication: No difficulties  Cognition Arousal/Alertness: Awake/alert Behavior During Therapy: WFL for tasks assessed/performed Overall Cognitive Status: Within Functional Limits for tasks assessed                      General Comments      Exercises        Assessment/Plan    PT Assessment Patent does not need any further PT services  PT Diagnosis  PT Problem List    PT Treatment Interventions     PT Goals (Current goals can be found in the Care Plan section) Acute Rehab PT Goals PT Goal Formulation: All assessment and education complete, DC therapy    Frequency     Barriers to discharge        Co-evaluation               End of Session Equipment Utilized During Treatment: Oxygen Activity Tolerance: Patient tolerated treatment well Patient left: in chair;with call bell/phone within reach           Time: 1410-1430 PT Time Calculation (min) (ACUTE ONLY): 20 min   Charges:   PT Evaluation $Initial PT Evaluation Tier I: 1 Procedure     PT G Codes:        LEMYRE,KATHrine E 06/06/2015, 3:28 PM Carmelia Bake, PT, DPT 06/06/2015 Pager: [phone]

## 2015-06-06 NOTE — Telephone Encounter (Signed)
Hosp f/u-s/w patient husband Emalina Dubreuil and gave appt for 10/26 @ 9:45 lab, 11/02 @ 4:15 w/Dr, [name]

## 2015-06-06 NOTE — Telephone Encounter (Signed)
Sent pof to tiffany in [facility] for hospital; follow up

## 2015-06-07 LAB — HOMOCYSTEINE: Homocysteine: 10.3 umol/L (ref 0.0–15.0)

## 2015-06-07 LAB — GLUCOSE, CAPILLARY
Glucose-Capillary: 114 mg/dL — ABNORMAL HIGH (ref 65–99)
Glucose-Capillary: 137 mg/dL — ABNORMAL HIGH (ref 65–99)

## 2015-06-07 MED ORDER — PREDNISONE 10 MG PO TABS: ORAL_TABLET | ORAL | Status: DC

## 2015-06-07 MED ORDER — PROPRANOLOL HCL 20 MG PO TABS: 20.0000 mg | ORAL_TABLET | Freq: Two times a day (BID) | ORAL | Status: DC

## 2015-06-07 MED ORDER — SACCHAROMYCES BOULARDII 250 MG PO CAPS: 250.0000 mg | ORAL_CAPSULE | Freq: Two times a day (BID) | ORAL | Status: DC

## 2015-06-07 MED ORDER — AZITHROMYCIN 500 MG PO TABS: ORAL_TABLET | ORAL | Status: DC

## 2015-06-07 MED ORDER — CLONAZEPAM 0.5 MG PO TABS: 0.5000 mg | ORAL_TABLET | Freq: Every day | ORAL | Status: DC

## 2015-06-07 NOTE — Progress Notes (Signed)
Contacted by bedside nurse that patient was d/ced prior to arranging O2. Order was not seen, no referral for CM made. Contacted patient to discuss and she states her and attending discussed prior to d/c and she did not feel she needed continuous O2. She states she has a CPAP with O2 (has a concentrator) that she wears at night. She states she also has a sat monitor and that she plans on monitoring her sats, if she feels she needs O2 she will apply with equipment currently in place. She declines further equipment or assistance setting up O2. Paged attending to notify her that patient declined O2. Bedside nurse aware.

## 2015-06-07 NOTE — Evaluation (Signed)
Occupational Therapy Evaluation Patient Details Name: Hayley [name] MRN: [id number] DOB: 31-Mar-1946 Today's Date: 06/07/2015    History of Present Illness 69 y.o. female with PMHx of DM 2, obstructive sleep apnea, anxiety and depression, PE, R TKA who presents with cough and shortness of breath which is been going on for about a week. It started with sinus drainage and a sore throat   Admitted for Acute respiratory failure with hypoxia suspected due to acute bronchitis.    Clinical Impression   Patient admitted with above. Patient independent > mod I PTA. Patient currently functioning at an independent > mod I level. D/C from acute OT services and no additional follow-up OT needs at this time. All appropriate education provided to patient. Please re-order OT if needed.  Spent time educated pt on energy conservation techniques/strategies. Pt with questions regarding how to decrease tremors, also educated pt on use of weights to decrease tremors for self-feeding, grooming tasks, and writing activities.     Follow Up Recommendations  No OT follow up;Supervision - Intermittent    Equipment Recommendations  None recommended by OT    Recommendations for Other Services  None at this time    Precautions / Restrictions Precautions Precautions: Fall Restrictions Weight Bearing Restrictions: No    Mobility Bed Mobility Overal bed mobility: Modified Independent (Per PT evaluation) Transfers Overall transfer level: Modified independent (per Pt evaluation)   Balance Overall balance assessment: No apparent balance deficits (not formally assessed) (Per PT evaluation)    ADL Overall ADL's : Modified independent General ADL Comments: Spent time educating patient on energy conservation and compensatory strategies for completing all she needs to do during day/week. Energy conservation handout administerred. Also educated pt on use of weights to decrease tremors in order to increase independence with  self-feeding, grooming, and writing.     Pertinent Vitals/Pain Pain Assessment: No/denies pain     Hand Dominance Right   Extremity/Trunk Assessment Upper Extremity Assessment Upper Extremity Assessment: Overall WFL for tasks assessed   Lower Extremity Assessment Lower Extremity Assessment: Overall WFL for tasks assessed   Cervical / Trunk Assessment Cervical / Trunk Assessment: Normal   Communication Communication Communication: No difficulties   Cognition Arousal/Alertness: Awake/alert Behavior During Therapy: WFL for tasks assessed/performed Overall Cognitive Status: Within Functional Limits for tasks assessed              Home Living Family/patient expects to be discharged to:: Private residence Living Arrangements: Spouse/significant other Available Help at Discharge: Family;Available 24 hours/day (21 yo husband) Type of Home: House Home Access: Stairs to enter   Entrance Stairs-Rails: Right Home Layout: Able to live on main level with bedroom/bathroom;Multi-level Home Equipment: Walker - 2 wheels;Shower seat;Bedside commode   Prior Functioning/Environment Level of Independence: Independent      OT Diagnosis: Generalized weakness   OT Problem List:  n/a, no acute OT needs identified    OT Treatment/Interventions:   n/a, no acute OT needs identified    OT Goals(Current goals can be found in the care plan section) Acute Rehab OT Goals Patient Stated Goal: go home today OT Goal Formulation: All assessment and education complete, DC therapy  OT Frequency:   n/a, no acute OT needs identified    Barriers to D/C:  None known at this time    End of Session Activity Tolerance: Patient tolerated treatment well Patient left: in bed;with call bell/phone within reach   Time: 1325-1354 OT Time Calculation (min): 29 min Charges:  OT General Charges $OT Visit:  1 Procedure OT Evaluation $Initial OT Evaluation Tier I: 1 Procedure  CLAY,PATRICIA , MS, OTR/L,  CLT Pager: [phone]  06/07/2015, 1:59 PM

## 2015-06-07 NOTE — Discharge Summary (Signed)
Reviewed d/c instructions with pt including follow up appointments, medications, and precautions.  She verbalized understanding, but was requesting RN go "as fast as possible."  Pt anxious stating her husband transports dialysis patients, was waiting downstairs for her, and is "very anxious about me getting out; he has a 3:00 appointment."  Pt declining need for pain or other medications prior to d/c.  Pt being d/c to home into care of spouse.

## 2015-06-07 NOTE — Discharge Summary (Signed)
Physician Discharge Summary  LENOR PROVENCHER MRN: [id number] DOB/AGE: [birth year] 69 y.o.  PCP: [name], FNP   Admit date: 06/03/2015 Discharge date: 06/07/2015  Discharge Diagnoses:     Principal Problem:   Acute respiratory failure with hypoxia Active Problems:   OSA on CPAP   Hypothyroidism   Diabetes mellitus without complication   Essential hypertension   Acute bronchitis    Follow-up recommendations Follow-up with PCP in 3-5 days , including all  additional recommended appointments as below Follow-up CBC, CMP in 3-5 days Patient is being discharged home with 1 L of oxygen continuous, this can be slowly weaned off in the outpatient setting DDimers Q 3 months and consider stopping pradaxa when we have 2 sequential normal readings.  The PCP to follow-up on hypercoagulable workup     Medication List    STOP taking these medications        EPIPEN 0.3 mg/0.3 mL Devi  Generic drug:  EPINEPHrine     ondansetron 8 MG disintegrating tablet  Commonly known as:  ZOFRAN-ODT      TAKE these medications        albuterol 108 (90 BASE) MCG/ACT inhaler  Commonly known as:  PROVENTIL HFA;VENTOLIN HFA  Inhale 1 puff into the lungs every 6 (six) hours as needed for wheezing or shortness of breath.     albuterol (2.5 MG/3ML) 0.083% nebulizer solution  Commonly known as:  PROVENTIL  Take 2.5 mg by nebulization every 6 (six) hours as needed for wheezing or shortness of breath.     aspirin 81 MG tablet  Take 81 mg by mouth daily.     azithromycin 500 MG tablet  Commonly known as:  ZITHROMAX  4 more days     clonazePAM 0.5 MG tablet  Commonly known as:  KLONOPIN  Take 0.5 mg by mouth at bedtime.     cyclobenzaprine 10 MG tablet  Commonly known as:  FLEXERIL  TAKE 1 TABLET BY MOUTH AT BEDTIME     fluorometholone 0.1 % ophthalmic suspension  Commonly known as:  FML  Place 1 drop into both eyes daily as needed (Eye allergies).     fluticasone 50 MCG/ACT nasal  spray  Commonly known as:  FLONASE  Place 1 spray into both nostrils daily as needed for allergies or rhinitis.     gabapentin 800 MG tablet  Commonly known as:  NEURONTIN  Take 800-1,600 mg by mouth 2 (two) times daily. 800 in the morning and 1600 at pm     levothyroxine 25 MCG tablet  Commonly known as:  SYNTHROID, LEVOTHROID  Take 25 mcg by mouth every morning.     methocarbamol 500 MG tablet  Commonly known as:  ROBAXIN  Take 1 tablet (500 mg total) by mouth every 6 (six) hours as needed for muscle spasms.     mometasone-formoterol 100-5 MCG/ACT Aero  Commonly known as:  DULERA  Inhale 2 puffs into the lungs 2 (two) times daily.     NUCYNTA 50 MG Tabs tablet  Generic drug:  tapentadol  Take 50 mg by mouth 3 (three) times daily as needed for moderate pain or severe pain.     ondansetron 4 MG tablet  Commonly known as:  ZOFRAN  Take 4 mg by mouth every 8 (eight) hours as needed for nausea or vomiting.     pantoprazole 40 MG tablet  Commonly known as:  PROTONIX  Take 40 mg by mouth daily.     PRADAXA 150 MG  Caps capsule  Generic drug:  dabigatran  Take 150 mg by mouth 2 (two) times daily.     predniSONE 10 MG tablet  Commonly known as:  DELTASONE  4 tablets for 4 days, 3 tablets for 4 days, 2 tablets for 4 days, 1 tablet for 4 days, half tablet for 4 days     promethazine 25 MG tablet  Commonly known as:  PHENERGAN  Take 25 mg by mouth every 6 (six) hours as needed for nausea or vomiting.     propranolol 20 MG tablet  Commonly known as:  INDERAL  Take 1 tablet (20 mg total) by mouth 2 (two) times daily.     rizatriptan 10 MG tablet  Commonly known as:  MAXALT  Take 10 mg by mouth as needed for migraine (migraine). May repeat in 2 hours if needed     sertraline 100 MG tablet  Commonly known as:  ZOLOFT  Take 100 mg by mouth daily.     sitaGLIPtin 100 MG tablet  Commonly known as:  JANUVIA  Take 100 mg by mouth every evening.     SUMAtriptan 25 MG tablet   Commonly known as:  IMITREX  Take 1 tablet (25 mg total) by mouth every 2 (two) hours as needed for migraine. May repeat in 2 hours if headache persists or recurs.     SUMAtriptan Succinate Refill 4 MG/0.5ML Soct  Inject 4 mg into the skin as needed.     valACYclovir 1000 MG tablet  Commonly known as:  VALTREX  Take 2,000 mg by mouth 2 (two) times daily as needed (Fever blisters).         Discharge Condition: Stable  Disposition: Home   Consults:  None  Significant Diagnostic Studies:  Dg Chest 2 View  06/06/2015   CLINICAL DATA:  Shortness of breath.  EXAM: CHEST  2 VIEW  COMPARISON:  June 03, 2015.  FINDINGS: The heart size and mediastinal contours are within normal limits. No pneumothorax or pleural effusion is noted. Right lung is clear. Mild left basilar subsegmental atelectasis is noted. Multilevel degenerative disc disease is noted in the thoracic spine.  IMPRESSION: Mild left basilar subsegmental atelectasis.   Electronically Signed   By: Marijo Conception, M.D.   On: 06/06/2015 14:13   Dg Chest 2 View  06/03/2015   CLINICAL DATA:  Right-sided chest pain, shortness of breath, cough and congestion 5 days.  EXAM: CHEST  2 VIEW  COMPARISON:  10/26/2014  FINDINGS: Lungs are adequately inflated without consolidation or effusion. Cardiomediastinal silhouette is within normal. There is mild degenerative change of the spine.  IMPRESSION: No active cardiopulmonary disease.   Electronically Signed   By: Marin Olp M.D.   On: 06/03/2015 12:50   Ct Angio Chest Pe W/cm &/or Wo Cm  06/03/2015   CLINICAL DATA:  Shortness of breath and hypoxia  EXAM: CT ANGIOGRAPHY CHEST WITH CONTRAST  TECHNIQUE: Multidetector CT imaging of the chest was performed using the standard protocol during bolus administration of intravenous contrast. Multiplanar CT image reconstructions and MIPs were obtained to evaluate the vascular anatomy.  CONTRAST:  128m OMNIPAQUE IOHEXOL 350 MG/ML SOLN  COMPARISON:   12/09/2014  FINDINGS: THORACIC INLET/BODY WALL:  No acute finding.  Bilateral sub glandular breast implant.  MEDIASTINUM:  Normal heart size. No pericardial effusion. Atherosclerosis, including along the proximal LAD. No evidence of acute pulmonary embolism. Filling defects seen previously have resolved. Tortuous but non aneurysmal aorta. No evidence of acute aortic pathology.  LUNG WINDOWS:  Subsegmental bibasilar atelectasis or scarring. No consolidation. No effusion. No suspicious pulmonary nodule.  UPPER ABDOMEN:  No acute findings.  OSSEOUS:  Diffuse disc narrowing and endplate spurring. No acute osseous finding.  Review of the MIP images confirms the above findings.  IMPRESSION: 1. No evidence of acute pulmonary embolism. Emboli seen 12/09/2014 have resolved. 2. Subsegmental atelectasis or scarring at the bases.   Electronically Signed   By: Monte Fantasia M.D.   On: 06/03/2015 16:58        Filed Weights   06/03/15 1129  Weight: 94.348 kg (208 lb)     Microbiology: No results found for this or any previous visit (from the past 240 hour(s)).     Blood Culture    Component Value Date/Time   SDES FLUID SYNOVIAL LEFT KNEE ON SWAB 02/24/2014 1129   SDES FLUID SYNOVIAL LEFT KNEE 02/24/2014 1129   SDES FLUID SYNOVIAL LEFT KNEE ON SWAB 02/24/2014 1129   SPECREQUEST NONE 02/24/2014 1129   SPECREQUEST NONE 02/24/2014 1129   SPECREQUEST NONE 02/24/2014 1129   CULT  02/24/2014 1129    NO GROWTH 3 DAYS Performed at [facility]  02/24/2014 1129    NO ANAEROBES ISOLATED Note: Gram Stain Report Called to,Read Back By and Verified With: DR.ALUSIO AT 1200 ON 06.10.15 BY SHUEA Performed at [facility] 02/27/2014 FINAL 02/24/2014 1129   REPTSTATUS 03/01/2014 FINAL 02/24/2014 1129   REPTSTATUS 02/24/2014 FINAL 02/24/2014 1129      Labs: Results for orders placed or performed during the hospital encounter of 06/03/15 (from the past 48 hour(s))   Glucose, capillary     Status: Abnormal   Collection Time: 06/05/15  2:05 PM  Result Value Ref Range   Glucose-Capillary 144 (H) 65 - 99 mg/dL  Glucose, capillary     Status: Abnormal   Collection Time: 06/05/15  4:34 PM  Result Value Ref Range   Glucose-Capillary 153 (H) 65 - 99 mg/dL  Glucose, capillary     Status: Abnormal   Collection Time: 06/05/15  6:27 PM  Result Value Ref Range   Glucose-Capillary 141 (H) 65 - 99 mg/dL  Glucose, capillary     Status: Abnormal   Collection Time: 06/05/15 10:49 PM  Result Value Ref Range   Glucose-Capillary 113 (H) 65 - 99 mg/dL  Basic metabolic panel     Status: Abnormal   Collection Time: 06/06/15  5:39 AM  Result Value Ref Range   Sodium 140 135 - 145 mmol/L   Potassium 4.2 3.5 - 5.1 mmol/L   Chloride 105 101 - 111 mmol/L   CO2 28 22 - 32 mmol/L   Glucose, Bld 129 (H) 65 - 99 mg/dL   BUN 15 6 - 20 mg/dL   Creatinine, Ser 0.70 0.44 - 1.00 mg/dL   Calcium 8.7 (L) 8.9 - 10.3 mg/dL   GFR calc non Af Amer >60 >60 mL/min   GFR calc Af Amer >60 >60 mL/min    Comment: (NOTE) The eGFR has been calculated using the CKD EPI equation. This calculation has not been validated in all clinical situations. eGFR's persistently <60 mL/min signify possible Chronic Kidney Disease.    Anion gap 7 5 - 15  CBC     Status: Abnormal   Collection Time: 06/06/15  5:39 AM  Result Value Ref Range   WBC 7.4 4.0 - 10.5 K/uL   RBC 4.52 3.87 - 5.11 MIL/uL   Hemoglobin 11.0 (L) 12.0 -  15.0 g/dL   HCT 36.3 36.0 - 46.0 %   MCV 80.3 78.0 - 100.0 fL   MCH 24.3 (L) 26.0 - 34.0 pg   MCHC 30.3 30.0 - 36.0 g/dL   RDW 18.4 (H) 11.5 - 15.5 %   Platelets 229 150 - 400 K/uL  Glucose, capillary     Status: None   Collection Time: 06/06/15  7:59 AM  Result Value Ref Range   Glucose-Capillary 99 65 - 99 mg/dL  Glucose, capillary     Status: Abnormal   Collection Time: 06/06/15 12:05 PM  Result Value Ref Range   Glucose-Capillary 118 (H) 65 - 99 mg/dL  Glucose,  capillary     Status: Abnormal   Collection Time: 06/06/15  5:28 PM  Result Value Ref Range   Glucose-Capillary 123 (H) 65 - 99 mg/dL  Glucose, capillary     Status: Abnormal   Collection Time: 06/06/15 10:02 PM  Result Value Ref Range   Glucose-Capillary 189 (H) 65 - 99 mg/dL  Glucose, capillary     Status: Abnormal   Collection Time: 06/07/15  8:44 AM  Result Value Ref Range   Glucose-Capillary 114 (H) 65 - 99 mg/dL     Lipid Panel  No results found for: CHOL, TRIG, HDL, CHOLHDL, VLDL, LDLCALC, LDLDIRECT   Lab Results  Component Value Date   HGBA1C 7.0* 06/04/2015   HGBA1C 7.8* 12/09/2014   HGBA1C 7.2* 12/21/2013     Lab Results  Component Value Date   CREATININE 0.70 06/06/2015     HPI :69 y.o. female DM 2, struck his sleep apnea, anxiety and depression who presents with cough and shortness of breath which is been going on for about a week. It started with sinus drainage and a sore throat and turned into a cough which is productive of yellow colored sputum. She was sent to the ER by her PCP as it was suspected she may have pneumonia. She is also complaining of nausea but no vomiting. She was not found to have a pneumonia on imaging however, was found to be quite hypoxic with a pulse ox in the 80s and was admitted. Apparently she was wheezing as well.  HOSPITAL COURSE:  Acute respiratory failure with hypoxia secondary to acute bronchitis-  history of PE treated with warfarin, now on Pradaxa -Suspect this is due to acute bronchitis with reactive airway disease -Status post Solu-Medrol no longer wheezing-steroids switched to prednisone taper - cont Dulera Continue Z-Pak for acute bronchitis 4 more days -When necessary nebulizer treatments -Continue oxygen as needed- pulse ox screen ordered- pulse ox dropping to 88% and becoming dizzy when ambulating, will discharge home with 1 L of oxygen -Flonase and Afrin for heavy sinus discharge & congestion Repeat chest x-ray on  9/19 does not show pneumonia, subsegmental atelectasis only  Orthostasis Improved after hydration with normal saline   OSA on CPAP -C Pap ordered  Recurrent DVT/PE -occuring in relation to knee replacement in march 2016- Ct 3/24 was positive for LUL and RLL PEs - previous history of DVT and PE after an appendectomy in 2014 - She has been on Pradaxa for greater than 6 months-CT angiogram is negative for acute PE Oncology recommends to continue dabigratan but follow DDimers Q 3 months and consider stopping pradaxa when we have 2 sequential normal readings. hypercoagulable w/u ordered- Dr [name] will set oupt outpt follow up for her    Hypothyroidism -Continue levothyroxine   Diabetes mellitus without complication Continue home regimen-continue sliding scale ,  hemoglobin A1c 7.0  Tremors - cont Propranolol but dose cut down from 40 mg BID to 20 mg BID due to hypotension  Chronic back pain- C spine and L spine - cont Nucynta   Discharge Exam: *   Blood pressure 118/79, pulse 66, temperature 97.6 F (36.4 C), temperature source Oral, resp. rate 18, height 5' 6.5" (1.689 m), weight 94.348 kg (208 lb), SpO2 95 %.  General: Pt is alert, not in acute distress  HEENT: No icterus, No thrush, oral mucosa moist  Cardiovascular: regular rate and rhythm, S1/S2 No murmur  Respiratory: clear to auscultation bilaterally   Abdomen: Soft, +Bowel sounds, non tender, non distended, no guarding  MSK: No LE edema, cyanosis or clubbing        Discharge Instructions    Diet - low sodium heart healthy    Complete by:  As directed      Increase activity slowly    Complete by:  As directed            Follow-up Information    Schedule an appointment as soon as possible for a visit with [name], FNP.   Specialty:  Internal Medicine   Contact information:   [facility] [place] [address] [phone]        Signed: Reyne Dumas 06/07/2015, 11:41 AM        Time spent >45 mins

## 2015-06-07 NOTE — Progress Notes (Signed)
Pt placed on CPAP at 4 CMH20 via FFM with 2 LPM O2 bleed in.  Pt tolerating well at this time, RT to monitor and assess as needed.

## 2015-06-13 ENCOUNTER — Other Ambulatory Visit: Payer: Self-pay | Admitting: Adult Health

## 2015-06-13 ENCOUNTER — Other Ambulatory Visit: Payer: Self-pay | Admitting: Neurology

## 2015-06-13 LAB — CARDIOLIPIN ANTIBODIES, IGG, IGM, IGA
Anticardiolipin IgA: <9 APL U/mL (ref 0–11)
Anticardiolipin IgG: <9 GPL U/mL (ref 0–14)
Anticardiolipin IgM: 34 MPL U/mL — ABNORMAL HIGH (ref 0–12)

## 2015-06-13 LAB — BETA-2-GLYCOPROTEIN I ABS, IGG/M/A
Beta-2 Glyco I IgG: <9 GPI IgG units (ref 0–20)
Beta-2-Glycoprotein I IgA: <9 GPI IgA units (ref 0–25)
Beta-2-Glycoprotein I IgM: 13 GPI IgM units (ref 0–32)

## 2015-06-13 LAB — LUPUS ANTICOAGULANT PANEL
DRVVT: 74.7 s — ABNORMAL HIGH (ref 0.0–55.1)
PTT Lupus Anticoagulant: 46.5 s (ref 0.0–50.0)

## 2015-06-13 LAB — PROTHROMBIN GENE MUTATION

## 2015-06-13 LAB — FACTOR 5 LEIDEN

## 2015-06-13 LAB — DRVVT MIX: dRVVT Mix: 56 s — ABNORMAL HIGH (ref 0.0–45.4)

## 2015-06-13 LAB — DRVVT CONFIRM: dRVVT Confirm: 1.2 ratio (ref 0.0–1.4)

## 2015-06-16 ENCOUNTER — Other Ambulatory Visit: Payer: Self-pay | Admitting: Adult Health

## 2015-06-17 ENCOUNTER — Other Ambulatory Visit: Payer: Self-pay | Admitting: Neurology

## 2015-06-20 ENCOUNTER — Other Ambulatory Visit: Payer: Self-pay | Admitting: Neurology

## 2015-06-22 DIAGNOSIS — D6859 Other primary thrombophilia: Secondary | ICD-10-CM | POA: Diagnosis not present

## 2015-06-22 DIAGNOSIS — E559 Vitamin D deficiency, unspecified: Secondary | ICD-10-CM | POA: Diagnosis not present

## 2015-06-22 DIAGNOSIS — Z7901 Long term (current) use of anticoagulants: Secondary | ICD-10-CM | POA: Diagnosis not present

## 2015-06-22 DIAGNOSIS — J45909 Unspecified asthma, uncomplicated: Secondary | ICD-10-CM | POA: Diagnosis not present

## 2015-06-22 DIAGNOSIS — M549 Dorsalgia, unspecified: Secondary | ICD-10-CM | POA: Diagnosis not present

## 2015-06-22 DIAGNOSIS — Z86711 Personal history of pulmonary embolism: Secondary | ICD-10-CM | POA: Diagnosis not present

## 2015-06-22 DIAGNOSIS — E538 Deficiency of other specified B group vitamins: Secondary | ICD-10-CM | POA: Diagnosis not present

## 2015-06-22 DIAGNOSIS — Z23 Encounter for immunization: Secondary | ICD-10-CM | POA: Diagnosis not present

## 2015-06-22 DIAGNOSIS — J9600 Acute respiratory failure, unspecified whether with hypoxia or hypercapnia: Secondary | ICD-10-CM | POA: Diagnosis not present

## 2015-07-07 ENCOUNTER — Other Ambulatory Visit: Payer: Self-pay | Admitting: Neurology

## 2015-07-11 ENCOUNTER — Other Ambulatory Visit: Payer: Self-pay

## 2015-07-11 ENCOUNTER — Telehealth: Payer: Self-pay

## 2015-07-11 MED ORDER — GABAPENTIN 800 MG PO TABS: ORAL_TABLET | ORAL | Status: DC

## 2015-07-11 NOTE — Telephone Encounter (Signed)
Patient obtained a Rx for Clonazepam 0.5mg  tablets one at bedtime from ED last month.  Pharmacy states she would like to know if Dr [name] would start prescribing this med.  Please advise.  Thank you.

## 2015-07-12 ENCOUNTER — Other Ambulatory Visit: Payer: Self-pay | Admitting: *Deleted

## 2015-07-12 ENCOUNTER — Other Ambulatory Visit: Payer: Self-pay | Admitting: Oncology

## 2015-07-12 DIAGNOSIS — D6859 Other primary thrombophilia: Secondary | ICD-10-CM

## 2015-07-12 DIAGNOSIS — Z86711 Personal history of pulmonary embolism: Secondary | ICD-10-CM

## 2015-07-12 DIAGNOSIS — I2699 Other pulmonary embolism without acute cor pulmonale: Secondary | ICD-10-CM

## 2015-07-12 NOTE — Telephone Encounter (Signed)
I saw patient for cervical radiculopathy, essential tremor, she had recent hospital admission at the end of September 2016 for respiratory failure with hypoxia, she does not have a follow-up appointment pending with our office, she should contact her primary care physician for clonazepam prescription if needed

## 2015-07-12 NOTE — Telephone Encounter (Signed)
I called the patient.  Got no answer, left message.  Contacted the pharmacy and relayed message.  They will contact PCP.

## 2015-07-13 ENCOUNTER — Other Ambulatory Visit: Payer: Self-pay

## 2015-07-14 ENCOUNTER — Other Ambulatory Visit: Payer: Self-pay | Admitting: *Deleted

## 2015-07-15 ENCOUNTER — Telehealth: Payer: Self-pay | Admitting: Oncology

## 2015-07-15 NOTE — Telephone Encounter (Signed)
No answer and vm on home number,left a message on cell with lab appointment

## 2015-07-18 ENCOUNTER — Other Ambulatory Visit (HOSPITAL_BASED_OUTPATIENT_CLINIC_OR_DEPARTMENT_OTHER): Payer: Medicare Other

## 2015-07-18 DIAGNOSIS — J441 Chronic obstructive pulmonary disease with (acute) exacerbation: Secondary | ICD-10-CM | POA: Diagnosis present

## 2015-07-18 DIAGNOSIS — I2699 Other pulmonary embolism without acute cor pulmonale: Secondary | ICD-10-CM

## 2015-07-18 DIAGNOSIS — D6859 Other primary thrombophilia: Secondary | ICD-10-CM

## 2015-07-18 DIAGNOSIS — Z86711 Personal history of pulmonary embolism: Secondary | ICD-10-CM

## 2015-07-18 LAB — CBC WITH DIFFERENTIAL/PLATELET
BASO%: 0.6 % (ref 0.0–2.0)
Basophils Absolute: 0.1 10*3/uL (ref 0.0–0.1)
EOS%: 1.5 % (ref 0.0–7.0)
Eosinophils Absolute: 0.1 10*3/uL (ref 0.0–0.5)
HCT: 36.1 % (ref 34.8–46.6)
HGB: 11.4 g/dL — ABNORMAL LOW (ref 11.6–15.9)
LYMPH%: 15 % (ref 14.0–49.7)
MCH: 24.9 pg — ABNORMAL LOW (ref 25.1–34.0)
MCHC: 31.7 g/dL (ref 31.5–36.0)
MCV: 78.6 fL — ABNORMAL LOW (ref 79.5–101.0)
MONO#: 0.5 10*3/uL (ref 0.1–0.9)
MONO%: 5.9 % (ref 0.0–14.0)
NEUT#: 6.4 10*3/uL (ref 1.5–6.5)
NEUT%: 77 % — ABNORMAL HIGH (ref 38.4–76.8)
Platelets: 321 10*3/uL (ref 145–400)
RBC: 4.59 10*6/uL (ref 3.70–5.45)
RDW: 17.2 % — ABNORMAL HIGH (ref 11.2–14.5)
WBC: 8.3 10*3/uL (ref 3.9–10.3)
lymph#: 1.3 10*3/uL (ref 0.9–3.3)

## 2015-07-18 LAB — COMPREHENSIVE METABOLIC PANEL (CC13)
ALT: 9 U/L (ref 0–55)
AST: 10 U/L (ref 5–34)
Albumin: 3.4 g/dL — ABNORMAL LOW (ref 3.5–5.0)
Alkaline Phosphatase: 79 U/L (ref 40–150)
Anion Gap: 7 mEq/L (ref 3–11)
BUN: 9.3 mg/dL (ref 7.0–26.0)
CO2: 26 mEq/L (ref 22–29)
Calcium: 9 mg/dL (ref 8.4–10.4)
Chloride: 108 mEq/L (ref 98–109)
Creatinine: 0.8 mg/dL (ref 0.6–1.1)
EGFR: 71 mL/min/{1.73_m2} — ABNORMAL LOW (ref >90)
Glucose: 163 mg/dl — ABNORMAL HIGH (ref 70–140)
Potassium: 4.3 mEq/L (ref 3.5–5.1)
Sodium: 140 mEq/L (ref 136–145)
Total Bilirubin: <0.3 mg/dL (ref 0.20–1.20)
Total Protein: 6.9 g/dL (ref 6.4–8.3)

## 2015-07-19 NOTE — Progress Notes (Signed)
Hayley [name]  [phone] [phone]) [phone]     ID: JERIS EASTERLY DOB: 1946/07/05  MR#: [id number]  AQT#:[id number]  Patient Care Team: [name], FNP as PCP - General (Internal Medicine) Juanita Craver, MD as Consulting Physician (Gastroenterology) Daryll Brod, MD as Consulting Physician (Orthopedic Surgery) PCP: [name], FNP SU: Michael Boston MD OTHER MD: Gaynelle Arabian MD  CHIEF COMPLAINT: coagulopathy  CURRENT TREATMENT: dabigratan/Pradaxa   HISTORY OF PRESENT ILLNESS: From the 06/05/2015 consult note:  "Ms Fang underwent laparoscopic appendectomy under Dr Johney [place] 11/27/2014 for what was likely a perforated appendix with phlegmon formation. On 12/06/2014 she was noted to be hypoxic and a chest CT/angio was obtained, showing multiple small bilateral PEs. LE dopplers that day were negative. UE dopplers the next day showed only a R cephalic v superficial thrombus.   The patient was started on warfarin, continued for one year. Repeat CT/angio 12/22/2013 showed resolution of the earlier clot.  On 12/06/2014 the patient underwent R TKR. On 12/09/2014 she was noted to be SOB and CT/angio documented bilateral PEs. She was started on Xarelto, later changed by her PCP to Pradaxa. Repeat CT angios 05/15/2014 and 06/03/2015 showed resolution of the earlier clots."  Her subsequent history is as detailed below  INTERVAL HISTORY: Hayley [name] returns today for follow-up of her coagulopathy. She continues on dabigratan, with no overt bleeding. She has had some bruising. She tells me that drug is expensive but does not actually know the cost.  At the recent hospitalization we obtained a hypercoagulable panel, detailed below. The only finding of concern was an intermediate level rise in the anticardiolipin IgM antibody. Also the d-dimer remained abnormal. We have just repeated those labs and they remain abnormal.  Also of concern is the fact that the patient has  fallen 3 times in the last several weeks. One time she was on a woman's conference and she tells me she was holding onto the railing but nevertheless managed to fall and go down 7 steps. Later she fell out of bed. In both those locations she hit her head but she says very mildly. Again these were several weeks ago and she denies unusual or persistent headaches, visual changes, nausea, vomiting, or stiff neck. [place] [facility] [employer] [place] doing some [place]. She did not hit her head that time. She did injure her left knee slightly.  REVIEW OF SYSTEMS: Aside from the problems noted above, she continues to have swallowing problems and she tells me she has contacted the surgeon in [place] who did her throat surgery to see if swallowing difficulties are what she could expect. She can get choked either on liquids or solids but this is very in constant. 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 She has had upper respiratory symptoms including seasonal allergies, mild productive cough, but no pleurisy. She does have chronic shortness of breath likely due to deconditioning. She has gained another 10 pounds, she tells me. She denies chest pain or pressure. Has not been any significant change in bowel or bladder habits. A detailed review of systems was otherwise stable.  PAST MEDICAL HISTORY: Past Medical History  Diagnosis Date  . Hyperthyroidism   . Toxic goiter   . Reflux   . Colon polyps   . Migraines   . 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 Obesity, Class III, BMI 40-49.9 (morbid obesity) 04/27/2010    02-17-14 reports  some weight loss- intentional  . Pelvic pain     02-17-14 "states thinks its back related on left groin"  . Anemia   . Clotting disorder 02-17-14    S/p appendectomy-developed Pulmonary emboli-tx. warfarin-d/c 1 yr ago.  . Osteoarthritis     in back(chronic pain)  . Pulmonary emboli     s/p Appendectomy '13 [facility]    . Diabetes mellitus without complication     "borderline"- oral med  . Right knee pain   . Sleep apnea     cpap - settings at 4   . Bronchitis     hx of   . Anxiety   . Depression   . Kidney stones   . Neuromuscular disorder     hands and throat"spasmodic dysphonia", tremors - thimb and forefinger   . Nausea Nov 15, 2014    cycle of migraine headaches and nausea    PAST SURGICAL HISTORY: Past Surgical History  Procedure Laterality Date  . Abdominal hysterectomy    . Zenkers diverticulum    . Kidney stone surgery    . Laparoscopic appendectomy  11/27/2011    Procedure: APPENDECTOMY LAPAROSCOPIC;  Surgeon: Adin Hector, MD;  Location: [facility];  Service: General;  Laterality: N/A;  . Cholecystectomy    . Knee arthroscopy  1998    right  . Hand surgery  2004    both hands  . Botox injection      for migranes  . Cataract extraction, bilateral Bilateral 03/2013   . Vocal cord surgery  02-17-14    01-12-14 done in [place] [facility](s/p selective denervation/reinnervation recurrent laryngeal nerve surgery)  . Appendectomy      3'13 [facility] s/p developed  Pulmonary Emboli  . Total knee revision Left 02/24/2014    Procedure: LEFT TOTAL KNEE REVISION;  Surgeon: Gearlean Alf, MD;  Location: [facility];  Service: Orthopedics;  Laterality: Left;  . Total knee arthroplasty Right 12/06/2014    Procedure: RIGHT TOTAL KNEE ARTHROPLASTY;  Surgeon: Gaynelle Arabian, MD;  Location: [facility];  Service: Orthopedics;  Laterality: Right;    FAMILY HISTORY Family History  Problem Relation Age of Onset  . Heart disease Mother   . Pneumonia Father   . Cirrhosis Father   . Hypertension Father   . Heart attack Paternal Grandfather   . Heart attack Maternal Grandfather   . Heart attack Paternal Grandmother   . Heart attack Maternal Grandmother   . Alcoholism Father     GYNECOLOGIC HISTORY:  No LMP recorded. Patient has had a hysterectomy.  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 The patient has 2 children from an earlier marriage, Corene Cornea, who lives in [place], and a daughter [name], who manages a Building surveyor. The patient has one grandchild of her own, 17 with Arnell Sieving (who has 3 children from a prior marriage)     HEALTH MAINTENANCE: Social History  Substance Use Topics  . Smoking status: Former Smoker    Quit date: 01/03/1982  . Smokeless tobacco: Never Used  . Alcohol Use: No     Colonoscopy:  PAP:  Bone density:  Lipid panel:  Allergies  Allergen Reactions  . Nutritional Supplements Anaphylaxis    Pt does not remember this happening  . Tape Rash    Rash with paper tape  . Bee Venom     unknown  . Etodolac Nausea  And Vomiting  . Morphine Other (See Comments)    Hallucinations  . Other Itching and Swelling  . Propoxyphene   . Propoxyphene N-Acetaminophen Nausea And Vomiting  . Penicillins Rash    Current Outpatient Prescriptions  Medication Sig Dispense Refill  . albuterol (PROVENTIL HFA;VENTOLIN HFA) 108 (90 BASE) MCG/ACT inhaler Inhale 1 puff into the lungs every 6 (six) hours as needed for wheezing or shortness of breath.    [name] albuterol (PROVENTIL) (2.5 MG/3ML) 0.083% nebulizer solution Take 2.5 mg by nebulization every 6 (six) hours as needed for wheezing or shortness of breath.    [name] aspirin 81 MG tablet Take 81 mg by mouth daily.    [name] azithromycin (ZITHROMAX) 500 MG tablet 4 more days 4 tablet 0  . clonazePAM (KLONOPIN) 0.5 MG tablet Take 0.5 mg by mouth at bedtime.    . clonazePAM (KLONOPIN) 0.5 MG tablet Take 1 tablet (0.5 mg total) by mouth at bedtime. 30 tablet 0  . cyclobenzaprine (FLEXERIL) 10 MG tablet TAKE 1 TABLET BY MOUTH AT BEDTIME (Patient taking differently: TAKE 10 MG BY MOUTH AT BEDTIME) 30 tablet 0  . Dabigatran Etexilate Mesylate (PRADAXA) 110 MG CAPS Take 110 mg by mouth 2 (two) times daily. 60 capsule 3  . fluorometholone (FML) 0.1  % ophthalmic suspension Place 1 drop into both eyes daily as needed (Eye allergies).   0  . fluticasone (FLONASE) 50 MCG/ACT nasal spray Place 1 spray into both nostrils daily as needed for allergies or rhinitis.    [name] gabapentin (NEURONTIN) 800 MG tablet 800mg  in the morning and 1600mg  at pm 90 tablet 6  . levothyroxine (SYNTHROID, LEVOTHROID) 25 MCG tablet Take 25 mcg by mouth every morning.     . methocarbamol (ROBAXIN) 500 MG tablet Take 1 tablet (500 mg total) by mouth every 6 (six) hours as needed for muscle spasms. 90 tablet 0  . mometasone-formoterol (DULERA) 100-5 MCG/ACT AERO Inhale 2 puffs into the lungs 2 (two) times daily.    . ondansetron (ZOFRAN) 4 MG tablet Take 4 mg by mouth every 8 (eight) hours as needed for nausea or vomiting.    . pantoprazole (PROTONIX) 40 MG tablet Take 40 mg by mouth daily.    . predniSONE (DELTASONE) 10 MG tablet 4 tablets for 4 days, 3 tablets for 4 days, 2 tablets for 4 days, 1 tablet for 4 days, half tablet for 4 days 150 tablet 0  . promethazine (PHENERGAN) 25 MG tablet Take 25 mg by mouth every 6 (six) hours as needed for nausea or vomiting.    . propranolol (INDERAL) 20 MG tablet Take 1 tablet (20 mg total) by mouth 2 (two) times daily. 60 tablet 1  . rizatriptan (MAXALT) 10 MG tablet Take 10 mg by mouth as needed for migraine (migraine). May repeat in 2 hours if needed    . saccharomyces boulardii (FLORASTOR) 250 MG capsule Take 1 capsule (250 mg total) by mouth 2 (two) times daily. 30 capsule 0  . sertraline (ZOLOFT) 100 MG tablet Take 100 mg by mouth daily.    . sitaGLIPtin (JANUVIA) 100 MG tablet Take 100 mg by mouth every evening.     . SUMAtriptan (IMITREX) 25 MG tablet TAKE 1 TABLET AS NEEDED FOR MIGRAINE. MAY REPEAT IN 2 HOURS IF HEADACHE PERSISTS OR RECURS 10 tablet 0  . SUMAtriptan Succinate Refill 4 MG/0.5ML SOCT Inject 4 mg into the skin as needed. (Patient taking differently: Inject 4 mg into the skin as needed (migraines). ) 5  mL 6  .  valACYclovir (VALTREX) 1000 MG tablet Take 2,000 mg by mouth 2 (two) times daily as needed (Fever blisters).   2   No current facility-administered medications for this visit.    OBJECTIVE: middle-aged African-American woman who appears older than stated age 69 Vitals:   07/20/15 1631  BP: 125/62  Pulse: 76  Temp: 98.4 F (36.9 C)  Resp: 18     Body mass index is 35.27 kg/(m^2).    ECOG FS:1 - Symptomatic but completely ambulatory  Sclerae unicteric, EOMs intact Oropharynx clear, no thrush or other lesions No cervical or supraclavicular adenopathy Lungs no rales or rhonchi Heart regular rate and rhythm Abd soft, obese,nontender, positive bowel sounds MSK no focal spinal tenderness, bruises left knee, butnormal range of motion Neuro: nonfocal, well oriented, appropriate affect Breasts: deferred   LAB RESULTS: Results for ADNA, NOFZIGER (MRN [id number]) as of 07/20/2015 17:57  Ref. Range 12/05/2011 20:54 12/21/2013 21:58 06/04/2015 15:56 07/18/2015 10:42  D-Dimer, Quant Latest Ref Range: 0.00-0.48 ug/mL-FEU 9.93 (H) 1.14 (H) 0.57 (H) 0.93 (H)   CMP     Component Value Date/Time   NA 140 07/18/2015 1042   NA 140 06/06/2015 0539   K 4.3 07/18/2015 1042   K 4.2 06/06/2015 0539   CL 105 06/06/2015 0539   CO2 26 07/18/2015 1042   CO2 28 06/06/2015 0539   GLUCOSE 163* 07/18/2015 1042   GLUCOSE 129* 06/06/2015 0539   BUN 9.3 07/18/2015 1042   BUN 15 06/06/2015 0539   CREATININE 0.8 07/18/2015 1042   CREATININE 0.70 06/06/2015 0539   CALCIUM 9.0 07/18/2015 1042   CALCIUM 8.7* 06/06/2015 0539   PROT 6.9 07/18/2015 1042   PROT 7.8 06/03/2015 1233   ALBUMIN 3.4* 07/18/2015 1042   ALBUMIN 3.9 06/03/2015 1233   AST 10 07/18/2015 1042   AST 21 06/03/2015 1233   ALT 9 07/18/2015 1042   ALT 14 06/03/2015 1233   ALKPHOS 79 07/18/2015 1042   ALKPHOS 92 06/03/2015 1233   BILITOT <0.30 07/18/2015 1042   BILITOT 0.7 06/03/2015 1233   GFRNONAA >60 06/06/2015 0539   GFRAA >60  06/06/2015 0539    INo results found for: SPEP, UPEP  Lab Results  Component Value Date   WBC 8.3 07/18/2015   NEUTROABS 6.4 07/18/2015   HGB 11.4* 07/18/2015   HCT 36.1 07/18/2015   MCV 78.6* 07/18/2015   PLT 321 07/18/2015      Chemistry      Component Value Date/Time   NA 140 07/18/2015 1042   NA 140 06/06/2015 0539   K 4.3 07/18/2015 1042   K 4.2 06/06/2015 0539   CL 105 06/06/2015 0539   CO2 26 07/18/2015 1042   CO2 28 06/06/2015 0539   BUN 9.3 07/18/2015 1042   BUN 15 06/06/2015 0539   CREATININE 0.8 07/18/2015 1042   CREATININE 0.70 06/06/2015 0539      Component Value Date/Time   CALCIUM 9.0 07/18/2015 1042   CALCIUM 8.7* 06/06/2015 0539   ALKPHOS 79 07/18/2015 1042   ALKPHOS 92 06/03/2015 1233   AST 10 07/18/2015 1042   AST 21 06/03/2015 1233   ALT 9 07/18/2015 1042   ALT 14 06/03/2015 1233   BILITOT <0.30 07/18/2015 1042   BILITOT 0.7 06/03/2015 1233       No results found for: LABCA2  No components found for: LABCA125  No results for input(s): INR in the last 168 hours.  Urinalysis    Component Value Date/Time  COLORURINE ORANGE 12/19/2014 0730   COLORURINE YELLOW 11/30/2014 1215   APPEARANCEUR CLOUDY 12/19/2014 0730   APPEARANCEUR CLEAR 11/30/2014 1215   LABSPEC 1.025 12/19/2014 0730   LABSPEC 1.019 11/30/2014 1215   PHURINE see comment 12/19/2014 0730   PHURINE 7.5 11/30/2014 1215   GLUCOSEU see comment 12/19/2014 0730   GLUCOSEU NEGATIVE 11/30/2014 1215   HGBUR see comment 12/19/2014 0730   HGBUR NEGATIVE 11/30/2014 1215   BILIRUBINUR see comment 12/19/2014 0730   BILIRUBINUR NEGATIVE 11/30/2014 1215   KETONESUR see comment 12/19/2014 0730   KETONESUR NEGATIVE 11/30/2014 1215   PROTEINUR see comment 12/19/2014 0730   PROTEINUR NEGATIVE 11/30/2014 1215   UROBILINOGEN 1.0 11/30/2014 1215   NITRITE SEE COMMENT 12/19/2014 0730   NITRITE NEGATIVE 11/30/2014 1215   LEUKOCYTESUR see comment 12/19/2014 0730   LEUKOCYTESUR SMALL*  11/30/2014 1215    STUDIES: No results found.  ASSESSMENT: 69 y.o. [place] woman with a history of recurrent PE as follows:  (1) multiple low-volume PE noted on chest CT/angio 12/06/2014, 10 days after laparoscopic appendectomy for a perforated appendix with phlegmon (a) bilateral LE dopplers 12/06/2014 negative (b) bilateral UE dopplers 12/07/2014 showed superficial R cephalic v clot (c) treatment: warfarin x one year, monitored through [facility] (d) repeat CT angio 12/23/2015 showed prior PEs cleared; also CT/angio 05/15/2014 negative  (2) bilateral PEs documented by Ct/angio03/24/2016, day 3 post R total knee (a) started on rivaroxaban/ Xarelto, switched by PCP to dabigratan/ Pradaxa (b) repeat CT/angio 06/03/2015 shows resolution of prior PE  (3) hypercoagulable panel 06/05/2015 including a homocystine level, a screen for the lupus anticoagulant, beta 2 glycoprotein studies, anti-cardiolipin antibodies, and studies for the factor V Leiden and prothrombin gene mutation found no actionable abnormalities  (a) the anticardiolipin IgM antibody 06/05/2015 was 34 ("low medium positive"), repeat 07/18/2015 was 38  PLAN: Hayley [name] is tolerating the Pradaxa without any overt bleeding problems. However she has fallen 3 times and hit her head twice in 2 of the falls. Of course this raises concerns regarding intracranial bleeds. She knows this can be a fatal complication of anticoagulation.  Even though her neuro exam was unremarkable and she had no symptoms suggestive of a bleed, I did suggest we go ahead and get a noncontrast head CT later today. She just doesn't feel she can fit it in until next week.I have put the order and so she can do it earlier if she finds that time, but she is going to be out of town basically for the next several days  She has already stopped the aspirin. I am going to drop the right axilla  dose 210 mg twice daily, which will lower the risk of bleeding. Of course there is also the risk of clotting and she knows that pulmonary emboli also can be life-threatening.  I was hoping her d-dimer would've normalized, but it actually is a little bit worse today than it was while in the hospital. The only abnormality I have found in her hypercoagulable panel is a slightly elevated anti-cardiolipin IgM antibody, which is persistent. I think this is of doubtful significance but anyway it does require follow-up and we will continue to repeat it.  The plan then will be to obtain labs including a d-dimer the first Monday of each month. Once we get to normal readings we can consider stopping the NOAC. If that does not occur then in March 2017, after a year on this medication, we will likely repeat a chest CT and stop anticoagulation with  close observation.  She knows to call for any problems that may develop before her next visit here.    Chauncey Cruel, MD   07/20/2015 5:56 PM Medical Oncology and Hematology [facility] [address] [place], [place] [address] Tel. [phone]    Fax. [phone]

## 2015-07-20 ENCOUNTER — Ambulatory Visit (HOSPITAL_BASED_OUTPATIENT_CLINIC_OR_DEPARTMENT_OTHER): Payer: Medicare Other | Admitting: Oncology

## 2015-07-20 VITALS — BP 125/62 | HR 76 | Temp 98.4°F | Resp 18 | Ht 66.5 in | Wt 221.8 lb

## 2015-07-20 DIAGNOSIS — I2699 Other pulmonary embolism without acute cor pulmonale: Secondary | ICD-10-CM | POA: Diagnosis not present

## 2015-07-20 DIAGNOSIS — S0990XS Unspecified injury of head, sequela: Secondary | ICD-10-CM

## 2015-07-20 DIAGNOSIS — D689 Coagulation defect, unspecified: Secondary | ICD-10-CM | POA: Diagnosis not present

## 2015-07-20 DIAGNOSIS — I2609 Other pulmonary embolism with acute cor pulmonale: Secondary | ICD-10-CM

## 2015-07-20 LAB — CARDIOLIPIN ANTIBODIES, IGG, IGM, IGA
Anticardiolipin IgA: <11 [APL'U]
Anticardiolipin IgG: <14 [GPL'U]
Anticardiolipin IgM: 38 [MPL'U] — ABNORMAL HIGH

## 2015-07-20 LAB — D-DIMER, QUANTITATIVE (NOT AT ARMC): D-Dimer, Quant: 0.93 ug/mL-FEU — ABNORMAL HIGH (ref 0.00–0.48)

## 2015-07-20 MED ORDER — DABIGATRAN ETEXILATE MESYLATE 110 MG PO CAPS: 110.0000 mg | ORAL_CAPSULE | Freq: Two times a day (BID) | ORAL | Status: DC

## 2015-07-21 ENCOUNTER — Telehealth: Payer: Self-pay | Admitting: Oncology

## 2015-07-21 NOTE — Telephone Encounter (Signed)
Called and left a message with all appointments and central schedule ing will call the patient

## 2015-08-06 ENCOUNTER — Other Ambulatory Visit: Payer: Self-pay | Admitting: Oncology

## 2015-08-15 DIAGNOSIS — J22 Unspecified acute lower respiratory infection: Secondary | ICD-10-CM | POA: Diagnosis not present

## 2015-08-15 DIAGNOSIS — J159 Unspecified bacterial pneumonia: Secondary | ICD-10-CM | POA: Diagnosis not present

## 2015-08-15 DIAGNOSIS — R0602 Shortness of breath: Secondary | ICD-10-CM | POA: Diagnosis not present

## 2015-08-15 DIAGNOSIS — R296 Repeated falls: Secondary | ICD-10-CM | POA: Diagnosis not present

## 2015-08-16 ENCOUNTER — Emergency Department (HOSPITAL_COMMUNITY): Payer: Medicare Other

## 2015-08-16 ENCOUNTER — Encounter (HOSPITAL_COMMUNITY): Payer: Self-pay | Admitting: Emergency Medicine

## 2015-08-16 ENCOUNTER — Emergency Department (HOSPITAL_COMMUNITY)
Admission: EM | Admit: 2015-08-16 | Discharge: 2015-08-16 | Disposition: A | Discharge status: Home / Self Care | Payer: Medicare Other | Attending: Emergency Medicine | Admitting: Emergency Medicine

## 2015-08-16 DIAGNOSIS — R0602 Shortness of breath: Secondary | ICD-10-CM | POA: Diagnosis not present

## 2015-08-16 DIAGNOSIS — Z86010 Personal history of colonic polyps: Secondary | ICD-10-CM | POA: Diagnosis not present

## 2015-08-16 DIAGNOSIS — Z7951 Long term (current) use of inhaled steroids: Secondary | ICD-10-CM | POA: Insufficient documentation

## 2015-08-16 DIAGNOSIS — Z862 Personal history of diseases of the blood and blood-forming organs and certain disorders involving the immune mechanism: Secondary | ICD-10-CM | POA: Insufficient documentation

## 2015-08-16 DIAGNOSIS — Z880 Allergy status to penicillin: Secondary | ICD-10-CM | POA: Diagnosis not present

## 2015-08-16 DIAGNOSIS — W19XXXA Unspecified fall, initial encounter: Secondary | ICD-10-CM | POA: Diagnosis not present

## 2015-08-16 DIAGNOSIS — E0500 Thyrotoxicosis with diffuse goiter without thyrotoxic crisis or storm: Secondary | ICD-10-CM | POA: Diagnosis not present

## 2015-08-16 DIAGNOSIS — E119 Type 2 diabetes mellitus without complications: Secondary | ICD-10-CM | POA: Insufficient documentation

## 2015-08-16 DIAGNOSIS — Z7902 Long term (current) use of antithrombotics/antiplatelets: Secondary | ICD-10-CM | POA: Insufficient documentation

## 2015-08-16 DIAGNOSIS — S02401A Maxillary fracture, unspecified, initial encounter for closed fracture: Secondary | ICD-10-CM | POA: Diagnosis not present

## 2015-08-16 DIAGNOSIS — G4730 Sleep apnea, unspecified: Secondary | ICD-10-CM | POA: Diagnosis not present

## 2015-08-16 DIAGNOSIS — Z043 Encounter for examination and observation following other accident: Secondary | ICD-10-CM | POA: Diagnosis present

## 2015-08-16 DIAGNOSIS — J40 Bronchitis, not specified as acute or chronic: Secondary | ICD-10-CM | POA: Diagnosis not present

## 2015-08-16 DIAGNOSIS — Y929 Unspecified place or not applicable: Secondary | ICD-10-CM | POA: Insufficient documentation

## 2015-08-16 DIAGNOSIS — Z8709 Personal history of other diseases of the respiratory system: Secondary | ICD-10-CM | POA: Insufficient documentation

## 2015-08-16 DIAGNOSIS — M545 Low back pain: Secondary | ICD-10-CM | POA: Diagnosis not present

## 2015-08-16 DIAGNOSIS — Y999 Unspecified external cause status: Secondary | ICD-10-CM | POA: Diagnosis not present

## 2015-08-16 DIAGNOSIS — S0993XA Unspecified injury of face, initial encounter: Secondary | ICD-10-CM | POA: Diagnosis not present

## 2015-08-16 DIAGNOSIS — Z87891 Personal history of nicotine dependence: Secondary | ICD-10-CM | POA: Diagnosis not present

## 2015-08-16 DIAGNOSIS — Z79899 Other long term (current) drug therapy: Secondary | ICD-10-CM | POA: Insufficient documentation

## 2015-08-16 DIAGNOSIS — M549 Dorsalgia, unspecified: Secondary | ICD-10-CM | POA: Diagnosis not present

## 2015-08-16 DIAGNOSIS — Z9981 Dependence on supplemental oxygen: Secondary | ICD-10-CM | POA: Diagnosis not present

## 2015-08-16 DIAGNOSIS — Z8701 Personal history of pneumonia (recurrent): Secondary | ICD-10-CM | POA: Diagnosis not present

## 2015-08-16 DIAGNOSIS — Y939 Activity, unspecified: Secondary | ICD-10-CM | POA: Diagnosis not present

## 2015-08-16 DIAGNOSIS — Z9049 Acquired absence of other specified parts of digestive tract: Secondary | ICD-10-CM | POA: Diagnosis not present

## 2015-08-16 DIAGNOSIS — R5383 Other fatigue: Secondary | ICD-10-CM | POA: Diagnosis not present

## 2015-08-16 DIAGNOSIS — Z86711 Personal history of pulmonary embolism: Secondary | ICD-10-CM | POA: Insufficient documentation

## 2015-08-16 DIAGNOSIS — G43909 Migraine, unspecified, not intractable, without status migrainosus: Secondary | ICD-10-CM | POA: Insufficient documentation

## 2015-08-16 DIAGNOSIS — Z8739 Personal history of other diseases of the musculoskeletal system and connective tissue: Secondary | ICD-10-CM | POA: Diagnosis not present

## 2015-08-16 DIAGNOSIS — R51 Headache: Secondary | ICD-10-CM | POA: Diagnosis not present

## 2015-08-16 DIAGNOSIS — F419 Anxiety disorder, unspecified: Secondary | ICD-10-CM | POA: Insufficient documentation

## 2015-08-16 DIAGNOSIS — K219 Gastro-esophageal reflux disease without esophagitis: Secondary | ICD-10-CM | POA: Diagnosis not present

## 2015-08-16 DIAGNOSIS — R220 Localized swelling, mass and lump, head: Secondary | ICD-10-CM | POA: Diagnosis not present

## 2015-08-16 DIAGNOSIS — M542 Cervicalgia: Secondary | ICD-10-CM | POA: Diagnosis not present

## 2015-08-16 LAB — COMPREHENSIVE METABOLIC PANEL
ALT: 13 U/L — ABNORMAL LOW (ref 14–54)
AST: 17 U/L (ref 15–41)
Albumin: 3.9 g/dL (ref 3.5–5.0)
Alkaline Phosphatase: 76 U/L (ref 38–126)
Anion gap: 9 (ref 5–15)
BUN: 8 mg/dL (ref 6–20)
CO2: 26 mmol/L (ref 22–32)
Calcium: 9.2 mg/dL (ref 8.9–10.3)
Chloride: 102 mmol/L (ref 101–111)
Creatinine, Ser: 0.76 mg/dL (ref 0.44–1.00)
GFR calc Af Amer: >60 mL/min (ref >60)
GFR calc non Af Amer: >60 mL/min (ref >60)
Glucose, Bld: 123 mg/dL — ABNORMAL HIGH (ref 65–99)
Potassium: 3.8 mmol/L (ref 3.5–5.1)
Sodium: 137 mmol/L (ref 135–145)
Total Bilirubin: 0.7 mg/dL (ref 0.3–1.2)
Total Protein: 7.9 g/dL (ref 6.5–8.1)

## 2015-08-16 LAB — CBC WITH DIFFERENTIAL/PLATELET
Basophils Absolute: 0 10*3/uL (ref 0.0–0.1)
Basophils Relative: 0 %
Eosinophils Absolute: 0.2 10*3/uL (ref 0.0–0.7)
Eosinophils Relative: 3 %
HCT: 40.6 % (ref 36.0–46.0)
Hemoglobin: 12.4 g/dL (ref 12.0–15.0)
Lymphocytes Relative: 22 %
Lymphs Abs: 1.7 10*3/uL (ref 0.7–4.0)
MCH: 24.7 pg — ABNORMAL LOW (ref 26.0–34.0)
MCHC: 30.5 g/dL (ref 30.0–36.0)
MCV: 80.9 fL (ref 78.0–100.0)
Monocytes Absolute: 0.7 10*3/uL (ref 0.1–1.0)
Monocytes Relative: 9 %
Neutro Abs: 4.9 10*3/uL (ref 1.7–7.7)
Neutrophils Relative %: 66 %
Platelets: 323 10*3/uL (ref 150–400)
RBC: 5.02 MIL/uL (ref 3.87–5.11)
RDW: 15.4 % (ref 11.5–15.5)
WBC: 7.5 10*3/uL (ref 4.0–10.5)

## 2015-08-16 MED ORDER — HYDROCODONE-ACETAMINOPHEN 5-325 MG PO TABS: 1.0000 | ORAL_TABLET | Freq: Four times a day (QID) | ORAL | Status: DC | PRN

## 2015-08-16 MED ORDER — DIPHENHYDRAMINE HCL 50 MG/ML IJ SOLN
25.0000 mg | Freq: Once | INTRAMUSCULAR | Status: AC
  Administered 2015-08-16: 25 mg via INTRAVENOUS
  Filled 2015-08-16: qty 1

## 2015-08-16 MED ORDER — METOCLOPRAMIDE HCL 5 MG/ML IJ SOLN
10.0000 mg | Freq: Once | INTRAMUSCULAR | Status: AC
  Administered 2015-08-16: 10 mg via INTRAVENOUS
  Filled 2015-08-16: qty 2

## 2015-08-16 MED ORDER — SODIUM CHLORIDE 0.9 % IV BOLUS (SEPSIS)
1000.0000 mL | Freq: Once | INTRAVENOUS | Status: AC
  Administered 2015-08-16: 1000 mL via INTRAVENOUS

## 2015-08-16 MED ORDER — HYDROCODONE-ACETAMINOPHEN 5-325 MG PO TABS
1.0000 | ORAL_TABLET | Freq: Once | ORAL | Status: AC
  Administered 2015-08-16: 1 via ORAL
  Filled 2015-08-16: qty 1

## 2015-08-16 NOTE — ED Notes (Addendum)
Pt. Declined to be put in gown. RN,Kellee made aware.

## 2015-08-16 NOTE — ED Notes (Signed)
Per EMS-states coming from MDs office-diagnosed with lower lobe PNA-fell forward yesterday, lower lip swollen-no LOC PCP sent her here for head CT

## 2015-08-16 NOTE — ED Notes (Signed)
Delay in lab draw,  Pt not in room 

## 2015-08-16 NOTE — ED Provider Notes (Signed)
CSN: [id number]     Arrival date & time 08/16/15  1708 History   First MD Initiated Contact with Patient 08/16/15 1813     Chief Complaint  Patient presents with  . PNA/fall      (Consider location/radiation/quality/duration/timing/severity/associated sxs/prior Treatment) Patient is a 69 y.o. female presenting with fall.  Fall This is a new problem. The current episode started yesterday. The problem occurs constantly. The problem has not changed since onset.Pertinent negatives include no chest pain, no abdominal pain and no shortness of breath. Nothing aggravates the symptoms. Nothing relieves the symptoms. She has tried nothing for the symptoms.    Past Medical History  Diagnosis Date  . Hyperthyroidism   . Toxic goiter   . Reflux   . Colon polyps   . Migraines   . 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 Obesity, Class III, BMI 40-49.9 (morbid obesity) ([place]) 04/27/2010    02-17-14 reports some weight loss- intentional  . Pelvic pain     02-17-14 "states thinks its back related on left groin"  . Anemia   . Clotting disorder ([place]) 02-17-14    S/p appendectomy-developed Pulmonary emboli-tx. warfarin-d/c 1 yr ago.  . Osteoarthritis     in back(chronic pain)  . Pulmonary emboli [facility])     s/p Appendectomy '13 [facility]  . Diabetes mellitus without complication ([place])     "borderline"- oral med  . Right knee pain   . Sleep apnea     cpap - settings at 4   . Bronchitis     hx of   . Anxiety   . Depression   . Kidney stones   . Neuromuscular disorder ([place])     hands and throat"spasmodic dysphonia", tremors - thimb and forefinger   . Nausea Nov 15, 2014    cycle of migraine headaches and nausea   Past Surgical History  Procedure Laterality Date  . Abdominal hysterectomy    . [place] diverticulum    . Kidney stone surgery    . Laparoscopic appendectomy  11/27/2011    Procedure: APPENDECTOMY LAPAROSCOPIC;  Surgeon: Adin Hector, MD;   Location: [facility];  Service: General;  Laterality: N/A;  . Cholecystectomy    . Knee arthroscopy  1998    right  . Hand surgery  2004    both hands  . Botox injection      for migranes  . Cataract extraction, bilateral Bilateral 03/2013   . Vocal cord surgery  02-17-14    01-12-14 done in [place] [facility](s/p selective denervation/reinnervation recurrent laryngeal nerve surgery)  . Appendectomy      3'13 [facility] s/p developed  Pulmonary Emboli  . Total knee revision Left 02/24/2014    Procedure: LEFT TOTAL KNEE REVISION;  Surgeon: Gearlean Alf, MD;  Location: [facility];  Service: Orthopedics;  Laterality: Left;  . Total knee arthroplasty Right 12/06/2014    Procedure: RIGHT TOTAL KNEE ARTHROPLASTY;  Surgeon: Gaynelle Arabian, MD;  Location: [facility];  Service: Orthopedics;  Laterality: Right;   Family History  Problem Relation Age of Onset  . Heart disease Mother   . Pneumonia Father   . Cirrhosis Father   . Hypertension Father   . Heart attack Paternal Grandfather   . Heart attack Maternal Grandfather   . Heart attack Paternal Grandmother   . Heart attack Maternal Grandmother   . Alcoholism Father    Social History  Substance Use Topics  . Smoking  status: Former Smoker    Quit date: 01/03/1982  . Smokeless tobacco: Never Used  . Alcohol Use: No   OB History    No data available     Review of Systems  Constitutional: Positive for fatigue. Negative for chills and diaphoresis.  HENT: Negative for congestion and drooling.   Eyes: Negative for pain and redness.  Respiratory: Negative for cough, chest tightness and shortness of breath.   Cardiovascular: Negative for chest pain.  Gastrointestinal: Negative for nausea, vomiting and abdominal pain.  Endocrine: Negative for polydipsia.  Musculoskeletal: Negative for back pain and arthralgias.  Skin: Negative for pallor and wound.  All other systems reviewed and are negative.     Allergies  Tape; Bee venom; Etodolac; Morphine;  Other; Propoxyphene; Propoxyphene n-acetaminophen; Poison ivy extract ; and Penicillins  Home Medications   Prior to Admission medications   Medication Sig Start Date End Date Taking? Authorizing Provider  albuterol (PROVENTIL HFA;VENTOLIN HFA) 108 (90 BASE) MCG/ACT inhaler Inhale 1 puff into the lungs every 6 (six) hours as needed for wheezing or shortness of breath.   Yes Historical Provider, MD  albuterol (PROVENTIL) (2.5 MG/3ML) 0.083% nebulizer solution Take 2.5 mg by nebulization every 6 (six) hours as needed for wheezing or shortness of breath.   Yes Historical Provider, MD  benzonatate (TESSALON) 100 MG capsule TAKE ONE CAPSULE BY MOUTH 3 TIMES A DAY AS NEEDED 06/22/15  Yes Historical Provider, MD  clonazePAM (KLONOPIN) 0.5 MG tablet Take 1 tablet (0.5 mg total) by mouth at bedtime. 06/07/15  Yes Reyne Dumas, MD  cyclobenzaprine (FLEXERIL) 10 MG tablet TAKE 1 TABLET BY MOUTH AT BEDTIME Patient taking differently: TAKE 10 MG BY MOUTH AT BEDTIME 05/06/15  Yes Monina C Medina-Vargas, NP  Dabigatran Etexilate Mesylate (PRADAXA) 110 MG CAPS Take 110 mg by mouth 2 (two) times daily. 07/20/15  Yes Chauncey Cruel, MD  DULoxetine (CYMBALTA) 60 MG capsule Take 60 mg by mouth every morning. 06/22/15  Yes Historical Provider, MD  fluticasone (FLONASE) 50 MCG/ACT nasal spray Place 1 spray into both nostrils daily as needed for allergies or rhinitis.   Yes Historical Provider, MD  gabapentin (NEURONTIN) 800 MG tablet 800mg  in the morning and 1600mg  at pm 07/11/15  Yes Marcial Pacas, MD  levofloxacin (LEVAQUIN) 500 MG tablet Take 1 tablet by mouth daily. Starting 08/15/15 for 10 days. 08/15/15  Yes Historical Provider, MD  levothyroxine (SYNTHROID, LEVOTHROID) 25 MCG tablet Take 25 mcg by mouth every morning.    Yes Historical Provider, MD  mometasone-formoterol (DULERA) 100-5 MCG/ACT AERO Inhale 2 puffs into the lungs 2 (two) times daily.   Yes Historical Provider, MD  NUCYNTA 50 MG TABS tablet Take 50 mg by  mouth 3 (three) times daily as needed. Pain. 07/22/15  Yes Historical Provider, MD  ondansetron (ZOFRAN) 4 MG tablet Take 4 mg by mouth every 8 (eight) hours as needed for nausea or vomiting.   Yes Historical Provider, MD  pantoprazole (PROTONIX) 40 MG tablet Take 40 mg by mouth daily.   Yes Historical Provider, MD  promethazine (PHENERGAN) 25 MG tablet Take 25 mg by mouth every 6 (six) hours as needed for nausea or vomiting.   Yes Historical Provider, MD  propranolol (INDERAL) 40 MG tablet Take 1 tablet by mouth 2 (two) times daily. 08/13/15  Yes Historical Provider, MD  rizatriptan (MAXALT) 10 MG tablet Take 10 mg by mouth as needed for migraine (migraine). May repeat in 2 hours if needed   Yes Historical Provider,  MD  saccharomyces boulardii (FLORASTOR) 250 MG capsule Take 1 capsule (250 mg total) by mouth 2 (two) times daily. Patient taking differently: Take 250 mg by mouth at bedtime.  06/07/15  Yes Reyne Dumas, MD  sertraline (ZOLOFT) 100 MG tablet Take 100 mg by mouth daily.   Yes Historical Provider, MD  sitaGLIPtin (JANUVIA) 100 MG tablet Take 100 mg by mouth every evening.    Yes Historical Provider, MD  SUMAtriptan (IMITREX) 25 MG tablet TAKE 1 TABLET AS NEEDED FOR MIGRAINE. MAY REPEAT IN 2 HOURS IF HEADACHE PERSISTS OR RECURS 06/20/15  Yes Marcial Pacas, MD  SUMAtriptan Succinate Refill 4 MG/0.5ML SOCT Inject 4 mg into the skin as needed. Patient taking differently: Inject 4 mg into the skin as needed (migraines).  04/26/15  Yes Marcial Pacas, MD  valACYclovir (VALTREX) 1000 MG tablet Take 2,000 mg by mouth 2 (two) times daily as needed (Fever blisters).  10/14/14  Yes Historical Provider, MD  HYDROcodone-acetaminophen (NORCO) 5-325 MG tablet Take 1 tablet by mouth every 6 (six) hours as needed. 08/16/15   Merrily Pew, MD   BP 115/78 mmHg  Pulse 69  Temp(Src) 99.7 F (37.6 C) (Oral)  Resp 18  SpO2 99% Physical Exam  Constitutional: She is oriented to person, place, and time. She appears  well-developed and well-nourished.  HENT:  Head: Normocephalic and atraumatic.  Upper lip swelling, ttp to left cheek  Eyes: Conjunctivae are normal. Pupils are equal, round, and reactive to light.  Neck: Normal range of motion.  Cardiovascular: Normal rate and regular rhythm.   Pulmonary/Chest: Effort normal and breath sounds normal. No stridor. No respiratory distress. She has no wheezes.  Abdominal: Soft. Bowel sounds are normal. She exhibits no distension. There is no tenderness.  Musculoskeletal: Normal range of motion. She exhibits no edema or tenderness.  Neurological: She is alert and oriented to person, place, and time. No cranial nerve deficit. Coordination normal.  No altered mental status, able to give full seemingly accurate history.  Face is symmetric, EOM's intact, pupils equal and reactive, vision intact, tongue and uvula midline without deviation Upper and Lower extremity motor 5/5, intact pain perception in distal extremities, 2+ reflexes in biceps, patella and achilles tendons. Finger to nose normal, heel to shin normal. Walks without assistance or evident ataxia.   Skin: Skin is warm and dry. No rash noted. No erythema.  Nursing note and vitals reviewed.   ED Course  Procedures (including critical care time) Labs Review Labs Reviewed  CBC WITH DIFFERENTIAL/PLATELET - Abnormal; Notable for the following:    MCH 24.7 (*)    All other components within normal limits  COMPREHENSIVE METABOLIC PANEL - Abnormal; Notable for the following:    Glucose, Bld 123 (*)    ALT 13 (*)    All other components within normal limits    Imaging Review Dg Chest 2 View  08/16/2015  CLINICAL DATA:  Evaluate for pneumonia.  Bronchitis. EXAM: CHEST  2 VIEW COMPARISON:  06/06/2015 FINDINGS: Hazy density at the right base is lobulated diaphragm based on previous CT. No convincing pneumonia. There is no edema, effusion, or pneumothorax. No cardiomegaly when accounting for an apical fat pad.  Negative aortic and hilar contours. IMPRESSION: No convincing pneumonia. Electronically Signed   By: Monte Fantasia M.D.   On: 08/16/2015 20:06   Ct Head Wo Contrast  08/16/2015  CLINICAL DATA:  [place] forward yesterday, with swollen lower lip, no loss of consciousness, has left sided headache and left jaw pain  EXAM: CT HEAD WITHOUT CONTRAST CT MAXILLOFACIAL WITHOUT CONTRAST TECHNIQUE: Multidetector CT imaging of the head and maxillofacial structures were performed using the standard protocol without intravenous contrast. Multiplanar CT image reconstructions of the maxillofacial structures were also generated. COMPARISON:  05/15/14 FINDINGS: CT HEAD FINDINGS No skull fracture. No hemorrhage or extra-axial fluid. No infarct mass or hydrocephalus. Moderate age-related atrophy with low attenuation in the deep white matter. CT MAXILLOFACIAL FINDINGS There is a hairline fracture through the anterior lateral right maxilla. No evidence of tooth fracture. Small focus of air in the deep soft tissues overlying the maxilla in this area consistent with laceration. Sinuses clear with no air-fluid levels. No other fractures. Mandibles intact. IMPRESSION: No acute intracranial abnormality. Hairline fracture right maxilla Electronically Signed   By: Skipper Cliche M.D.   On: 08/16/2015 19:34   Ct Cervical Spine Wo Contrast  08/16/2015  CLINICAL DATA:  Neck pain after falling yesterday, extending into right shoulder EXAM: CT CERVICAL SPINE WITHOUT CONTRAST TECHNIQUE: Multidetector CT imaging of the cervical spine was performed without intravenous contrast. Multiplanar CT image reconstructions were also generated. COMPARISON:  None. FINDINGS: Normal alignment with no soft tissues swelling. No fracture. Moderate degenerative disc disease throughout the mid to lower cervical spine. Lung apices clear. IMPRESSION: Degenerative changes with no acute findings Electronically Signed   By: Skipper Cliche M.D.   On: 08/16/2015 18:38    Ct Maxillofacial Wo Cm  08/16/2015  CLINICAL DATA:  [place] forward yesterday, with swollen lower lip, no loss of consciousness, has left sided headache and left jaw pain EXAM: CT HEAD WITHOUT CONTRAST CT MAXILLOFACIAL WITHOUT CONTRAST TECHNIQUE: Multidetector CT imaging of the head and maxillofacial structures were performed using the standard protocol without intravenous contrast. Multiplanar CT image reconstructions of the maxillofacial structures were also generated. COMPARISON:  05/15/14 FINDINGS: CT HEAD FINDINGS No skull fracture. No hemorrhage or extra-axial fluid. No infarct mass or hydrocephalus. Moderate age-related atrophy with low attenuation in the deep white matter. CT MAXILLOFACIAL FINDINGS There is a hairline fracture through the anterior lateral right maxilla. No evidence of tooth fracture. Small focus of air in the deep soft tissues overlying the maxilla in this area consistent with laceration. Sinuses clear with no air-fluid levels. No other fractures. Mandibles intact. IMPRESSION: No acute intracranial abnormality. Hairline fracture right maxilla Electronically Signed   By: Skipper Cliche M.D.   On: 08/16/2015 19:34   I have personally reviewed and evaluated these images and lab results as part of my medical decision-making.   EKG Interpretation None      MDM   Final diagnoses:  Fall    Diagnoses pneumonia by primary doctor Sharen Counter had multiple episodes of fall recently. States she's vomited 5 times in the last 2 months. She says she doesn't black out she doesn't really remember the details on the falls. Her CT here negative aside from a hairline maxillary fracture for which she will have pain medication to take at home. Rest of x-rays and CT scans negative. Labs are okay. We'll continue to follow up with primary doctor for workup of her multiple falls. Doubt syncope or significant neurologic issues at this time.    Merrily Pew, MD 08/17/15 [phone]

## 2015-08-22 ENCOUNTER — Other Ambulatory Visit: Payer: Self-pay

## 2015-08-22 DIAGNOSIS — Z96652 Presence of left artificial knee joint: Secondary | ICD-10-CM | POA: Diagnosis not present

## 2015-08-22 DIAGNOSIS — Z471 Aftercare following joint replacement surgery: Secondary | ICD-10-CM | POA: Diagnosis not present

## 2015-08-22 DIAGNOSIS — Z96651 Presence of right artificial knee joint: Secondary | ICD-10-CM | POA: Diagnosis not present

## 2015-08-22 DIAGNOSIS — M25511 Pain in right shoulder: Secondary | ICD-10-CM | POA: Diagnosis not present

## 2015-08-22 DIAGNOSIS — Z96653 Presence of artificial knee joint, bilateral: Secondary | ICD-10-CM | POA: Diagnosis not present

## 2015-08-24 DIAGNOSIS — S0240CA Maxillary fracture, right side, initial encounter for closed fracture: Secondary | ICD-10-CM | POA: Diagnosis not present

## 2015-08-26 DIAGNOSIS — R269 Unspecified abnormalities of gait and mobility: Secondary | ICD-10-CM | POA: Diagnosis not present

## 2015-09-21 ENCOUNTER — Other Ambulatory Visit: Payer: Self-pay

## 2015-09-22 ENCOUNTER — Other Ambulatory Visit: Payer: Self-pay | Admitting: Neurology

## 2015-09-22 DIAGNOSIS — J309 Allergic rhinitis, unspecified: Secondary | ICD-10-CM | POA: Diagnosis not present

## 2015-09-22 DIAGNOSIS — G4733 Obstructive sleep apnea (adult) (pediatric): Secondary | ICD-10-CM | POA: Diagnosis not present

## 2015-09-22 DIAGNOSIS — J45909 Unspecified asthma, uncomplicated: Secondary | ICD-10-CM | POA: Diagnosis not present

## 2015-09-22 DIAGNOSIS — F329 Major depressive disorder, single episode, unspecified: Secondary | ICD-10-CM | POA: Diagnosis not present

## 2015-10-17 ENCOUNTER — Ambulatory Visit: Payer: Self-pay | Admitting: Oncology

## 2015-10-19 DIAGNOSIS — F329 Major depressive disorder, single episode, unspecified: Secondary | ICD-10-CM | POA: Diagnosis not present

## 2015-10-19 DIAGNOSIS — J45909 Unspecified asthma, uncomplicated: Secondary | ICD-10-CM | POA: Diagnosis not present

## 2015-10-19 DIAGNOSIS — E669 Obesity, unspecified: Secondary | ICD-10-CM | POA: Diagnosis not present

## 2015-10-19 DIAGNOSIS — E119 Type 2 diabetes mellitus without complications: Secondary | ICD-10-CM | POA: Diagnosis not present

## 2015-10-24 ENCOUNTER — Other Ambulatory Visit (HOSPITAL_BASED_OUTPATIENT_CLINIC_OR_DEPARTMENT_OTHER): Payer: Medicare Other

## 2015-10-24 ENCOUNTER — Telehealth: Payer: Self-pay | Admitting: Oncology

## 2015-10-24 DIAGNOSIS — S0990XS Unspecified injury of head, sequela: Secondary | ICD-10-CM | POA: Diagnosis not present

## 2015-10-24 DIAGNOSIS — I2609 Other pulmonary embolism with acute cor pulmonale: Secondary | ICD-10-CM | POA: Diagnosis not present

## 2015-10-24 DIAGNOSIS — D689 Coagulation defect, unspecified: Secondary | ICD-10-CM

## 2015-10-24 LAB — CBC WITH DIFFERENTIAL/PLATELET
BASO%: 0.5 % (ref 0.0–2.0)
Basophils Absolute: 0 10*3/uL (ref 0.0–0.1)
EOS%: 3.2 % (ref 0.0–7.0)
Eosinophils Absolute: 0.2 10*3/uL (ref 0.0–0.5)
HCT: 39.1 % (ref 34.8–46.6)
HGB: 12.1 g/dL (ref 11.6–15.9)
LYMPH%: 20.5 % (ref 14.0–49.7)
MCH: 24.6 pg — ABNORMAL LOW (ref 25.1–34.0)
MCHC: 31.1 g/dL — ABNORMAL LOW (ref 31.5–36.0)
MCV: 79.2 fL — ABNORMAL LOW (ref 79.5–101.0)
MONO#: 0.6 10*3/uL (ref 0.1–0.9)
MONO%: 9.9 % (ref 0.0–14.0)
NEUT#: 3.9 10*3/uL (ref 1.5–6.5)
NEUT%: 65.9 % (ref 38.4–76.8)
Platelets: 269 10*3/uL (ref 145–400)
RBC: 4.93 10*6/uL (ref 3.70–5.45)
RDW: 19 % — ABNORMAL HIGH (ref 11.2–14.5)
WBC: 6 10*3/uL (ref 3.9–10.3)
lymph#: 1.2 10*3/uL (ref 0.9–3.3)

## 2015-10-24 LAB — COMPREHENSIVE METABOLIC PANEL
ALT: 15 U/L (ref 0–55)
AST: 15 U/L (ref 5–34)
Albumin: 3.6 g/dL (ref 3.5–5.0)
Alkaline Phosphatase: 85 U/L (ref 40–150)
Anion Gap: 11 mEq/L (ref 3–11)
BUN: 5.3 mg/dL — ABNORMAL LOW (ref 7.0–26.0)
CO2: 24 mEq/L (ref 22–29)
Calcium: 9.2 mg/dL (ref 8.4–10.4)
Chloride: 108 mEq/L (ref 98–109)
Creatinine: 0.8 mg/dL (ref 0.6–1.1)
EGFR: 74 mL/min/{1.73_m2} — ABNORMAL LOW (ref >90)
Glucose: 147 mg/dl — ABNORMAL HIGH (ref 70–140)
Potassium: 4 mEq/L (ref 3.5–5.1)
Sodium: 142 mEq/L (ref 136–145)
Total Bilirubin: <0.3 mg/dL (ref 0.20–1.20)
Total Protein: 7.2 g/dL (ref 6.4–8.3)

## 2015-10-24 NOTE — Telephone Encounter (Signed)
Patient stopped by to r/s missed appointment from 1/30. Gave patient new f/u for 2/20 and printed schedule.

## 2015-10-25 LAB — D-DIMER, QUANTITATIVE (NOT AT ARMC): D-DIMER: 1.02 mg/L FEU — ABNORMAL HIGH (ref 0.00–0.49)

## 2015-10-26 LAB — CARDIOLIPIN ANTIBODIES, IGG, IGM, IGA
Anticardiolipin Ab,IgA,Qn: <9 APL U/mL (ref 0–11)
Anticardiolipin Ab,IgG,Qn: <9 GPL U/mL (ref 0–14)
Anticardiolipin Ab,IgM,Qn: 36 MPL U/mL — ABNORMAL HIGH (ref 0–12)

## 2015-11-07 ENCOUNTER — Encounter: Payer: Self-pay | Admitting: Oncology

## 2015-11-07 ENCOUNTER — Ambulatory Visit: Payer: Self-pay | Admitting: Oncology

## 2015-11-07 NOTE — Progress Notes (Signed)
No show

## 2015-11-12 ENCOUNTER — Telehealth: Payer: Self-pay | Admitting: Oncology

## 2015-11-12 NOTE — Telephone Encounter (Signed)
Returned patient call re rescheduling 2/20 f/u and refilling pradaxa prescription. No availability w/GM - dded f/u w/HB to 3/6 lab - desk nurse made aware on 2/24. Spoke with desk nurse on Friday 2/24 re patient's refill request. Schedule mailed.

## 2015-11-16 DIAGNOSIS — B370 Candidal stomatitis: Secondary | ICD-10-CM | POA: Diagnosis not present

## 2015-11-16 DIAGNOSIS — J45909 Unspecified asthma, uncomplicated: Secondary | ICD-10-CM | POA: Diagnosis not present

## 2015-11-16 DIAGNOSIS — F419 Anxiety disorder, unspecified: Secondary | ICD-10-CM | POA: Diagnosis not present

## 2015-11-17 DIAGNOSIS — M545 Low back pain: Secondary | ICD-10-CM | POA: Diagnosis not present

## 2015-11-21 ENCOUNTER — Ambulatory Visit: Payer: Self-pay | Admitting: Nurse Practitioner

## 2015-11-21 ENCOUNTER — Other Ambulatory Visit: Payer: Self-pay

## 2015-11-22 ENCOUNTER — Telehealth: Payer: Self-pay | Admitting: *Deleted

## 2015-11-22 ENCOUNTER — Institutional Professional Consult (permissible substitution): Payer: Self-pay | Admitting: Internal Medicine

## 2015-11-22 NOTE — Telephone Encounter (Signed)
Called to follow up concerning missed appt yesterday with NP. No answer but left a detailed message for pt to callback to r/s this appt. I reminded pt that we just want to make sure she's doing well and we can't do this without seeing her. Message to be fwd to [employer]

## 2015-12-01 ENCOUNTER — Other Ambulatory Visit: Payer: Self-pay | Admitting: Gastroenterology

## 2015-12-01 DIAGNOSIS — K5730 Diverticulosis of large intestine without perforation or abscess without bleeding: Secondary | ICD-10-CM | POA: Diagnosis not present

## 2015-12-01 DIAGNOSIS — R140 Abdominal distension (gaseous): Secondary | ICD-10-CM | POA: Diagnosis not present

## 2015-12-01 DIAGNOSIS — R1310 Dysphagia, unspecified: Secondary | ICD-10-CM

## 2015-12-01 DIAGNOSIS — K219 Gastro-esophageal reflux disease without esophagitis: Secondary | ICD-10-CM | POA: Diagnosis not present

## 2015-12-02 ENCOUNTER — Ambulatory Visit
Admission: RE | Admit: 2015-12-02 | Discharge: 2015-12-02 | Disposition: A | Discharge status: Home / Self Care | Payer: Medicare Other | Source: Ambulatory Visit | Attending: Gastroenterology | Admitting: Gastroenterology

## 2015-12-02 DIAGNOSIS — R1310 Dysphagia, unspecified: Secondary | ICD-10-CM | POA: Diagnosis not present

## 2015-12-02 DIAGNOSIS — T17300A Unspecified foreign body in larynx causing asphyxiation, initial encounter: Secondary | ICD-10-CM | POA: Diagnosis not present

## 2015-12-06 ENCOUNTER — Telehealth: Payer: Self-pay | Admitting: Nurse Practitioner

## 2015-12-06 NOTE — Telephone Encounter (Signed)
Patient called to r/s missed 3/6 appt with HB for 4/3

## 2015-12-08 ENCOUNTER — Other Ambulatory Visit (HOSPITAL_COMMUNITY): Payer: Self-pay | Admitting: Gastroenterology

## 2015-12-08 DIAGNOSIS — R1310 Dysphagia, unspecified: Secondary | ICD-10-CM

## 2015-12-12 DIAGNOSIS — M545 Low back pain: Secondary | ICD-10-CM | POA: Diagnosis not present

## 2015-12-13 ENCOUNTER — Ambulatory Visit (HOSPITAL_COMMUNITY): Payer: Medicare Other

## 2015-12-13 ENCOUNTER — Inpatient Hospital Stay (HOSPITAL_COMMUNITY): Admission: RE | Admit: 2015-12-13 | Payer: Self-pay | Source: Ambulatory Visit

## 2015-12-19 ENCOUNTER — Other Ambulatory Visit: Payer: Self-pay

## 2015-12-19 ENCOUNTER — Telehealth: Payer: Self-pay | Admitting: *Deleted

## 2015-12-19 ENCOUNTER — Ambulatory Visit: Payer: Self-pay | Admitting: Nurse Practitioner

## 2015-12-19 NOTE — Telephone Encounter (Signed)
Called pt to inquire abt missed appt for today/ Pt said she had called at 12:30 to leave a message and couldn't get through to let us know she would not be able to come today. Pt is not feeling well. She has had symptoms of stomach upset and nausea,aches and pain intermittently but has not gone to see her PCP as of yet or Urgent Care. Pt says this has been going on for weeks on and off. Pt says she has "good days and bad days". I advised pt to be seen by doctor this week. Message to be fwd to [employer].

## 2015-12-29 ENCOUNTER — Telehealth: Payer: Self-pay | Admitting: Neurology

## 2015-12-29 ENCOUNTER — Other Ambulatory Visit (HOSPITAL_COMMUNITY): Payer: Self-pay | Admitting: Gastroenterology

## 2015-12-29 DIAGNOSIS — R1310 Dysphagia, unspecified: Secondary | ICD-10-CM

## 2015-12-29 MED ORDER — PRIMIDONE 50 MG PO TABS: ORAL_TABLET | ORAL | Status: DC

## 2015-12-29 NOTE — Telephone Encounter (Signed)
Pt is requesting a new rx for premidone. She says she has been prescribed by her previous physician. She does not remember what dosage or the frequency. She also wants to know about a deep brain stimulation. Please call and advise [phone]

## 2015-12-29 NOTE — Telephone Encounter (Signed)
I called patient. The patient wishes to give a trial on Mysoline for the tremor again, I will call in a prescription for her. She may be interested in a deep brain stimulator for her tremor and for her spasmodic dysphonia.

## 2015-12-30 NOTE — Telephone Encounter (Signed)
Called pt back per pt request. Made f/u per Dr [name] request on 02/01/16 at 930am. Check in 915am. ELK 12/30/15

## 2015-12-30 NOTE — Telephone Encounter (Signed)
Called pt to schedule f/u per Dr [name] request. Pt stated she was on her cell and to call her back in 15 min to schedule.

## 2015-12-30 NOTE — Telephone Encounter (Signed)
Please give her a follow up visit. Last office appt was August 2016

## 2016-01-03 ENCOUNTER — Ambulatory Visit (HOSPITAL_COMMUNITY)
Admission: RE | Admit: 2016-01-03 | Discharge: 2016-01-03 | Disposition: A | Discharge status: Home / Self Care | Payer: Medicare Other | Source: Ambulatory Visit | Attending: Gastroenterology | Admitting: Gastroenterology

## 2016-01-03 DIAGNOSIS — R633 Feeding difficulties: Secondary | ICD-10-CM | POA: Diagnosis not present

## 2016-01-03 DIAGNOSIS — R948 Abnormal results of function studies of other organs and systems: Secondary | ICD-10-CM | POA: Insufficient documentation

## 2016-01-03 DIAGNOSIS — R1310 Dysphagia, unspecified: Secondary | ICD-10-CM | POA: Diagnosis not present

## 2016-01-05 DIAGNOSIS — M533 Sacrococcygeal disorders, not elsewhere classified: Secondary | ICD-10-CM | POA: Diagnosis not present

## 2016-01-10 ENCOUNTER — Ambulatory Visit (HOSPITAL_BASED_OUTPATIENT_CLINIC_OR_DEPARTMENT_OTHER): Payer: Medicare Other | Admitting: Nurse Practitioner

## 2016-01-10 ENCOUNTER — Telehealth: Payer: Self-pay | Admitting: Nurse Practitioner

## 2016-01-10 ENCOUNTER — Encounter: Payer: Self-pay | Admitting: Nurse Practitioner

## 2016-01-10 ENCOUNTER — Other Ambulatory Visit (HOSPITAL_BASED_OUTPATIENT_CLINIC_OR_DEPARTMENT_OTHER): Payer: Medicare Other

## 2016-01-10 VITALS — BP 138/96 | HR 70 | Temp 98.4°F | Resp 18 | Wt 237.5 lb

## 2016-01-10 DIAGNOSIS — I2609 Other pulmonary embolism with acute cor pulmonale: Secondary | ICD-10-CM

## 2016-01-10 DIAGNOSIS — S0990XS Unspecified injury of head, sequela: Secondary | ICD-10-CM | POA: Diagnosis not present

## 2016-01-10 DIAGNOSIS — I2699 Other pulmonary embolism without acute cor pulmonale: Secondary | ICD-10-CM | POA: Diagnosis not present

## 2016-01-10 DIAGNOSIS — D689 Coagulation defect, unspecified: Secondary | ICD-10-CM | POA: Diagnosis not present

## 2016-01-10 LAB — CBC WITH DIFFERENTIAL/PLATELET
BASO%: 0.6 % (ref 0.0–2.0)
Basophils Absolute: 0.1 10*3/uL (ref 0.0–0.1)
EOS%: 2.3 % (ref 0.0–7.0)
Eosinophils Absolute: 0.2 10*3/uL (ref 0.0–0.5)
HCT: 41.9 % (ref 34.8–46.6)
HGB: 13.3 g/dL (ref 11.6–15.9)
LYMPH%: 18.2 % (ref 14.0–49.7)
MCH: 25.7 pg (ref 25.1–34.0)
MCHC: 31.6 g/dL (ref 31.5–36.0)
MCV: 81.3 fL (ref 79.5–101.0)
MONO#: 0.7 10*3/uL (ref 0.1–0.9)
MONO%: 7.5 % (ref 0.0–14.0)
NEUT#: 6.2 10*3/uL (ref 1.5–6.5)
NEUT%: 71.4 % (ref 38.4–76.8)
Platelets: 278 10*3/uL (ref 145–400)
RBC: 5.16 10*6/uL (ref 3.70–5.45)
RDW: 17.1 % — ABNORMAL HIGH (ref 11.2–14.5)
WBC: 8.7 10*3/uL (ref 3.9–10.3)
lymph#: 1.6 10*3/uL (ref 0.9–3.3)

## 2016-01-10 LAB — COMPREHENSIVE METABOLIC PANEL
ALT: 12 U/L (ref 0–55)
AST: 13 U/L (ref 5–34)
Albumin: 3.7 g/dL (ref 3.5–5.0)
Alkaline Phosphatase: 92 U/L (ref 40–150)
Anion Gap: 7 mEq/L (ref 3–11)
BUN: 9.8 mg/dL (ref 7.0–26.0)
CO2: 28 mEq/L (ref 22–29)
Calcium: 9.3 mg/dL (ref 8.4–10.4)
Chloride: 101 mEq/L (ref 98–109)
Creatinine: 0.8 mg/dL (ref 0.6–1.1)
EGFR: 71 mL/min/{1.73_m2} — ABNORMAL LOW (ref >90)
Glucose: 105 mg/dl (ref 70–140)
Potassium: 4.5 mEq/L (ref 3.5–5.1)
Sodium: 137 mEq/L (ref 136–145)
Total Bilirubin: 0.39 mg/dL (ref 0.20–1.20)
Total Protein: 7.8 g/dL (ref 6.4–8.3)

## 2016-01-10 MED ORDER — DABIGATRAN ETEXILATE MESYLATE 75 MG PO CAPS
75.0000 mg | ORAL_CAPSULE | Freq: Two times a day (BID) | ORAL | Status: DC
Start: 2016-01-10 — End: 2016-03-23

## 2016-01-10 NOTE — Telephone Encounter (Signed)
appt made and avs printed °

## 2016-01-10 NOTE — Progress Notes (Signed)
[place]  [phone] [phone]     ID: SHONETTA FERO DOB: 1945/12/26  MR#: [id number]  [id number]  Patient Care Team: [name], FNP as PCP - General (Internal Medicine) Juanita Craver, MD as Consulting Physician (Gastroenterology) Daryll Brod, MD as Consulting Physician (Orthopedic Surgery) PCP: [name], FNP SU: Michael Boston MD OTHER MD: Gaynelle Arabian MD  CHIEF COMPLAINT: coagulopathy  CURRENT TREATMENT: dabigratan/Pradaxa   HISTORY OF PRESENT ILLNESS: From the 06/05/2015 consult note:  "Ms Kogler underwent laparoscopic appendectomy under Dr Johney [place] 11/27/2014 for what was likely a perforated appendix with phlegmon formation. On 12/06/2014 she was noted to be hypoxic and a chest CT/angio was obtained, showing multiple small bilateral PEs. LE dopplers that day were negative. UE dopplers the next day showed only a R cephalic v superficial thrombus.   The patient was started on warfarin, continued for one year. Repeat CT/angio 12/22/2013 showed resolution of the earlier clot.  On 12/06/2014 the patient underwent R TKR. On 12/09/2014 she was noted to be SOB and CT/angio documented bilateral PEs. She was started on Xarelto, later changed by her PCP to Pradaxa. Repeat CT angios 05/15/2014 and 06/03/2015 showed resolution of the earlier clots."  Her subsequent history is as detailed below  INTERVAL HISTORY: [name] returns today for follow-up of her coagulopathy. She continues on pradaxa, but her dose was reportedly dropped to from 110 to 75mg  BID by her PCP per the patient. This drug costs her about $180/mo. She has bruising but denies abnormal bleeding. She has scratches to her right forearm from her dog playing too rough. She also has a bruise under her right eye. She denies falls in the past 4 months. She was supposed to be having labs drawn monthly, but she has been maybe 50% compliant with this recommendation.  REVIEW OF  SYSTEMS: [name] has chronic back pain and works with a pain management clinic. She receives periodic injections and is prescribed hydrocodone PRN. She is also on sumatriptan for her migraines. She is chronically short of breath. She was put on propranolol for her heart racing. She has hand tremors. She is occasionally nauseous. She has difficulty swallowing and a voice disorder. She recently had a swallowing study done that was normal. She continues to work with Dr. Collene Mares for this issue. A detailed review of system is otherwise stable.   PAST MEDICAL HISTORY: Past Medical History  Diagnosis Date  . Hyperthyroidism   . Toxic goiter   . Reflux   . Colon polyps   . Migraines   . 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 Obesity, Class III, BMI 40-49.9 (morbid obesity) ([place]) 04/27/2010    02-17-14 reports some weight loss- intentional  . Pelvic pain     02-17-14 "states thinks its back related on left groin"  . Anemia   . Clotting disorder ([place]) 02-17-14    S/p appendectomy-developed Pulmonary emboli-tx. warfarin-d/c 1 yr ago.  . Osteoarthritis     in back(chronic pain)  . Pulmonary emboli [facility])     s/p Appendectomy '13 [facility]  . Diabetes mellitus without complication ([place])     "borderline"- oral med  . Right knee pain   . Sleep apnea     cpap - settings at 4   . Bronchitis     hx of   . Anxiety   . Depression   . Kidney stones   . Neuromuscular disorder ([place])  hands and throat"spasmodic dysphonia", tremors - thimb and forefinger   . Nausea Nov 15, 2014    cycle of migraine headaches and nausea    PAST SURGICAL HISTORY: Past Surgical History  Procedure Laterality Date  . Abdominal hysterectomy    . Zenkers diverticulum    . Kidney stone surgery    . Laparoscopic appendectomy  11/27/2011    Procedure: APPENDECTOMY LAPAROSCOPIC;  Surgeon: Adin Hector, MD;  Location: [facility];  Service: General;  Laterality: N/A;  . Cholecystectomy     . Knee arthroscopy  1998    right  . Hand surgery  2004    both hands  . Botox injection      for migranes  . Cataract extraction, bilateral Bilateral 03/2013   . Vocal cord surgery  02-17-14    01-12-14 done in [place] [facility](s/p selective denervation/reinnervation recurrent laryngeal nerve surgery)  . Appendectomy      3'13 [facility] s/p developed  Pulmonary Emboli  . Total knee revision Left 02/24/2014    Procedure: LEFT TOTAL KNEE REVISION;  Surgeon: Gearlean Alf, MD;  Location: [facility];  Service: Orthopedics;  Laterality: Left;  . Total knee arthroplasty Right 12/06/2014    Procedure: RIGHT TOTAL KNEE ARTHROPLASTY;  Surgeon: Gaynelle Arabian, MD;  Location: [facility];  Service: Orthopedics;  Laterality: Right;    FAMILY HISTORY Family History  Problem Relation Age of Onset  . Heart disease Mother   . Pneumonia Father   . Cirrhosis Father   . Hypertension Father   . Heart attack Paternal Grandfather   . Heart attack Maternal Grandfather   . Heart attack Paternal Grandmother   . Heart attack Maternal Grandmother   . Alcoholism Father     GYNECOLOGIC HISTORY:  No LMP recorded. Patient has had a hysterectomy.  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 The patient has 2 children from an earlier marriage, Corene Cornea, who lives in [place], and a daughter [name], who manages a Building surveyor. The patient has one grandchild of her own, 97 with Arnell Sieving (who has 3 children from a prior marriage)     HEALTH MAINTENANCE: Social History  Substance Use Topics  . Smoking status: Former Smoker    Quit date: 01/03/1982  . Smokeless tobacco: Never Used  . Alcohol Use: No     Colonoscopy:  PAP:  Bone density:  Lipid panel:  Allergies  Allergen Reactions  . Tape Rash    Rash with paper tape  . Bee Venom     unknown  . Etodolac Nausea And Vomiting  . Morphine Other (See Comments)     Hallucinations  . Other Itching and Swelling  . Propoxyphene   . Propoxyphene N-Acetaminophen Nausea And Vomiting  . Poison Ivy Extract  [Extract Of Poison Ivy] Itching and Swelling  . Penicillins Rash         Current Outpatient Prescriptions  Medication Sig Dispense Refill  . albuterol (PROVENTIL HFA;VENTOLIN HFA) 108 (90 BASE) MCG/ACT inhaler Inhale 1 puff into the lungs every 6 (six) hours as needed for wheezing or shortness of breath.    . clonazePAM (KLONOPIN) 0.5 MG tablet Take 1 tablet (0.5 mg total) by mouth at bedtime. 30 tablet 0  . cyclobenzaprine (FLEXERIL) 10 MG tablet TAKE 1 TABLET BY MOUTH AT BEDTIME (Patient taking differently: TAKE 10 MG BY MOUTH AT BEDTIME) 30 tablet 0  .  dabigatran (PRADAXA) 75 MG CAPS capsule Take 1 capsule (75 mg total) by mouth 2 (two) times daily. 180 capsule 3  . DULoxetine (CYMBALTA) 60 MG capsule Take 60 mg by mouth every morning.    . fluticasone (FLONASE) 50 MCG/ACT nasal spray Place 1 spray into both nostrils daily as needed for allergies or rhinitis.    [name] gabapentin (NEURONTIN) 800 MG tablet 800mg  in the morning and 1600mg  at pm 90 tablet 6  . HYDROcodone-acetaminophen (NORCO) 5-325 MG tablet Take 1 tablet by mouth every 6 (six) hours as needed. 10 tablet 0  . levothyroxine (SYNTHROID, LEVOTHROID) 25 MCG tablet Take 25 mcg by mouth every morning.     . mometasone-formoterol (DULERA) 100-5 MCG/ACT AERO Inhale 2 puffs into the lungs 2 (two) times daily.    . ondansetron (ZOFRAN) 4 MG tablet Take 4 mg by mouth every 8 (eight) hours as needed for nausea or vomiting.    . pantoprazole (PROTONIX) 40 MG tablet Take 40 mg by mouth daily.    . primidone (MYSOLINE) 50 MG tablet 1 tablet at night for 3 weeks, then take 1 tablet twice daily (Patient taking differently: Take 50 mg by mouth. 2 tablets at bedtime for tremors) 60 tablet 3  . propranolol (INDERAL) 40 MG tablet Take 1 tablet by mouth 2 (two) times daily.    [name] saccharomyces boulardii  (FLORASTOR) 250 MG capsule Take 1 capsule (250 mg total) by mouth 2 (two) times daily. (Patient taking differently: Take 250 mg by mouth at bedtime. ) 30 capsule 0  . sertraline (ZOLOFT) 100 MG tablet Take 100 mg by mouth daily.    . SUMAtriptan Succinate Refill 4 MG/0.5ML SOCT Inject 4 mg into the skin as needed. (Patient taking differently: Inject 4 mg into the skin as needed (migraines). ) 5 mL 6  . albuterol (PROVENTIL) (2.5 MG/3ML) 0.083% nebulizer solution Take 2.5 mg by nebulization every 6 (six) hours as needed for wheezing or shortness of breath. Reported on 01/10/2016    . benzonatate (TESSALON) 100 MG capsule Reported on 01/10/2016  1  . rizatriptan (MAXALT) 10 MG tablet Take 10 mg by mouth as needed for migraine (migraine). Reported on 01/10/2016    . sitaGLIPtin (JANUVIA) 100 MG tablet Take 100 mg by mouth every evening. Reported on 01/10/2016    . SUMAtriptan (IMITREX) 25 MG tablet TAKE 1 TABLET AS NEEDED FOR MIGRAINE. MAY REPEAT IN 2 HOURS IF HEADACHE PERSISTS OR RECURS (Patient not taking: Reported on 01/10/2016) 10 tablet 1  . valACYclovir (VALTREX) 1000 MG tablet Take 2,000 mg by mouth 2 (two) times daily as needed (Fever blisters). Reported on 01/10/2016  2   No current facility-administered medications for this visit.    OBJECTIVE: middle-aged African-American woman who appears older than stated age 3 Vitals:   01/10/16 1415 01/10/16 1421  BP: 102/48 138/96  Pulse:    Temp:    Resp:       Body mass index is 37.76 kg/(m^2).    ECOG FS:1 - Symptomatic but completely ambulatory  Skin: warm, dry, scratches to right forearm. Bruise under orbit of right eye. HEENT: sclerae anicteric, conjunctivae pink, oropharynx clear. No thrush or mucositis.  Lymph Nodes: No cervical or supraclavicular lymphadenopathy  Lungs: clear to auscultation bilaterally, no rales, wheezes, or rhonci  Heart: regular rate and rhythm  Abdomen: round, soft, non tender, positive bowel sounds   Musculoskeletal: No focal spinal tenderness, no peripheral edema  Neuro: non focal, well oriented, positive affect  Breasts: deferred  LAB RESULTS: Results for [facility], BORSETH (MRN [id number]) as of 07/20/2015 17:57  Ref. Range 12/05/2011 20:54 12/21/2013 21:58 06/04/2015 15:56 07/18/2015 10:42  D-Dimer, Quant Latest Ref Range: 0.00-0.48 ug/mL-FEU 9.93 (H) 1.14 (H) 0.57 (H) 0.93 (H)   CMP     Component Value Date/Time   NA 137 01/10/2016 1347   NA 137 08/16/2015 1955   K 4.5 01/10/2016 1347   K 3.8 08/16/2015 1955   CL 102 08/16/2015 1955   CO2 28 01/10/2016 1347   CO2 26 08/16/2015 1955   GLUCOSE 105 01/10/2016 1347   GLUCOSE 123* 08/16/2015 1955   BUN 9.8 01/10/2016 1347   BUN 8 08/16/2015 1955   CREATININE 0.8 01/10/2016 1347   CREATININE 0.76 08/16/2015 1955   CALCIUM 9.3 01/10/2016 1347   CALCIUM 9.2 08/16/2015 1955   PROT 7.8 01/10/2016 1347   PROT 7.9 08/16/2015 1955   ALBUMIN 3.7 01/10/2016 1347   ALBUMIN 3.9 08/16/2015 1955   AST 13 01/10/2016 1347   AST 17 08/16/2015 1955   ALT 12 01/10/2016 1347   ALT 13* 08/16/2015 1955   ALKPHOS 92 01/10/2016 1347   ALKPHOS 76 08/16/2015 1955   BILITOT 0.39 01/10/2016 1347   BILITOT 0.7 08/16/2015 1955   GFRNONAA >60 08/16/2015 1955   GFRAA >60 08/16/2015 1955    INo results found for: SPEP, UPEP  Lab Results  Component Value Date   WBC 8.7 01/10/2016   NEUTROABS 6.2 01/10/2016   HGB 13.3 01/10/2016   HCT 41.9 01/10/2016   MCV 81.3 01/10/2016   PLT 278 01/10/2016      Chemistry      Component Value Date/Time   NA 137 01/10/2016 1347   NA 137 08/16/2015 1955   K 4.5 01/10/2016 1347   K 3.8 08/16/2015 1955   CL 102 08/16/2015 1955   CO2 28 01/10/2016 1347   CO2 26 08/16/2015 1955   BUN 9.8 01/10/2016 1347   BUN 8 08/16/2015 1955   CREATININE 0.8 01/10/2016 1347   CREATININE 0.76 08/16/2015 1955      Component Value Date/Time   CALCIUM 9.3 01/10/2016 1347   CALCIUM 9.2 08/16/2015 1955   ALKPHOS 92  01/10/2016 1347   ALKPHOS 76 08/16/2015 1955   AST 13 01/10/2016 1347   AST 17 08/16/2015 1955   ALT 12 01/10/2016 1347   ALT 13* 08/16/2015 1955   BILITOT 0.39 01/10/2016 1347   BILITOT 0.7 08/16/2015 1955       No results found for: LABCA2  No components found for: LABCA125  No results for input(s): INR in the last 168 hours.  Urinalysis    Component Value Date/Time   COLORURINE ORANGE 12/19/2014 0730   COLORURINE YELLOW 11/30/2014 1215   APPEARANCEUR CLOUDY 12/19/2014 0730   APPEARANCEUR CLEAR 11/30/2014 1215   LABSPEC 1.025 12/19/2014 0730   LABSPEC 1.019 11/30/2014 1215   PHURINE see comment 12/19/2014 0730   PHURINE 7.5 11/30/2014 1215   GLUCOSEU see comment 12/19/2014 0730   GLUCOSEU NEGATIVE 11/30/2014 1215   HGBUR see comment 12/19/2014 0730   HGBUR NEGATIVE 11/30/2014 1215   BILIRUBINUR see comment 12/19/2014 0730   BILIRUBINUR NEGATIVE 11/30/2014 1215   KETONESUR see comment 12/19/2014 0730   KETONESUR NEGATIVE 11/30/2014 1215   PROTEINUR see comment 12/19/2014 0730   PROTEINUR NEGATIVE 11/30/2014 1215   UROBILINOGEN 1.0 11/30/2014 1215   NITRITE SEE COMMENT 12/19/2014 0730   NITRITE NEGATIVE 11/30/2014 1215   LEUKOCYTESUR see comment 12/19/2014 0730   LEUKOCYTESUR SMALL* 11/30/2014  1215    STUDIES: Dg Op Swallowing Func-medicare/speech Path  01/03/2016  Objective Swallowing Evaluation: Type of Study: MBS-Modified Barium Swallow Study Patient Details Name: MATHA HANDLIN MRN: [id number] Date of Birth: 1946/05/15 Today's Date: 01/03/2016 Time: SLP Start Time (ACUTE ONLY): 1155-SLP Stop Time (ACUTE ONLY): 1222 SLP Time Calculation (min) (ACUTE ONLY): 27 min Past Medical History: Past Medical History Diagnosis Date . Hyperthyroidism  . Toxic goiter  . Reflux  . Colon polyps  . Migraines  . Disorder of vocal cords    spasmotic dysphonia ,02-17-14 has" whispery voice-low tone" . Obesity, Class III, BMI 40-49.9 (morbid obesity) ([place]) 04/27/2010   02-17-14 reports some weight  loss- intentional . Pelvic pain    02-17-14 "states thinks its back related on left groin" . Anemia  . Clotting disorder ([place]) 02-17-14   S/p appendectomy-developed Pulmonary emboli-tx. warfarin-d/c 1 yr ago. . Osteoarthritis    in back(chronic pain) . Pulmonary emboli [facility])    s/p Appendectomy '13 [facility] . Diabetes mellitus without complication ([place])    "borderline"- oral med . Right knee pain  . Sleep apnea    cpap - settings at 4  . Bronchitis    hx of  . Anxiety  . Depression  . Kidney stones  . Neuromuscular disorder ([place])    hands and throat"spasmodic dysphonia", tremors - thimb and forefinger  . Nausea Nov 15, 2014   cycle of migraine headaches and nausea Past Surgical History: Past Surgical History Procedure Laterality Date . Abdominal hysterectomy   . Zenkers diverticulum   . Kidney stone surgery   . Laparoscopic appendectomy  11/27/2011   Procedure: APPENDECTOMY LAPAROSCOPIC;  Surgeon: Adin Hector, MD;  Location: [facility];  Service: General;  Laterality: N/A; . Cholecystectomy   . Knee arthroscopy  1998   right . Hand surgery  2004   both hands . Botox injection     for migranes . Cataract extraction, bilateral Bilateral 03/2013  . Vocal cord surgery  02-17-14   01-12-14 done in [place] [facility](s/p selective denervation/reinnervation recurrent laryngeal nerve surgery) . Appendectomy     3'13 [facility] s/p developed  Pulmonary Emboli . Total knee revision Left 02/24/2014   Procedure: LEFT TOTAL KNEE REVISION;  Surgeon: Gearlean Alf, MD;  Location: [facility];  Service: Orthopedics;  Laterality: Left; . Total knee arthroplasty Right 12/06/2014   Procedure: RIGHT TOTAL KNEE ARTHROPLASTY;  Surgeon: Gaynelle Arabian, MD;  Location[facility];  Service: Orthopedics;  Laterality: Right; HPI: Pt arrives for an outpatient MBS due to finding of penetration on her esophagram. She reports occasional globus. She has a history of spasmodic dysphonia s/p surgery and also botox 2 years ago with SLP tx at [facility]. Pt also has a history of  Zenker's diverticulum with surgery as well as a nonspecific neurologic disorder; pt reports that some symptoms consistent are with Parkinson's.  No Data Recorded Assessment / Plan / Recommendation CHL IP CLINICAL IMPRESSIONS 01/03/2016 Therapy Diagnosis WFL Clinical Impression Pt demonstrates swallow function WNL. No finding of aspiration or impairment. Recommend pt continue current diet. No SLP f/u needed.  Impact on safety and function Mild aspiration risk   CHL IP TREATMENT RECOMMENDATION 01/03/2016 Treatment Recommendations No treatment recommended at this time   No flowsheet data found. CHL IP DIET RECOMMENDATION 01/03/2016 SLP Diet Recommendations Regular solids;Thin liquid Liquid Administration via Cup;Straw Medication Administration Whole meds with liquid Compensations -- Postural Changes --   CHL IP OTHER RECOMMENDATIONS 01/03/2016 Recommended Consults -- Oral Care Recommendations Patient independent with  oral care Other Recommendations --   CHL IP FOLLOW UP RECOMMENDATIONS 01/03/2016 Follow up Recommendations None   No flowsheet data found.     CHL IP ORAL PHASE 01/03/2016 Oral Phase WFL Oral - Pudding Teaspoon -- Oral - Pudding Cup -- Oral - Honey Teaspoon -- Oral - Honey Cup -- Oral - Nectar Teaspoon -- Oral - Nectar Cup -- Oral - Nectar Straw -- Oral - Thin Teaspoon -- Oral - Thin Cup -- Oral - Thin Straw -- Oral - Puree -- Oral - Mech Soft -- Oral - Regular -- Oral - Multi-Consistency -- Oral - Pill -- Oral Phase - Comment --  CHL IP PHARYNGEAL PHASE 01/03/2016 Pharyngeal Phase WFL Pharyngeal- Pudding Teaspoon -- Pharyngeal -- Pharyngeal- Pudding Cup -- Pharyngeal -- Pharyngeal- Honey Teaspoon -- Pharyngeal -- Pharyngeal- Honey Cup -- Pharyngeal -- Pharyngeal- Nectar Teaspoon -- Pharyngeal -- Pharyngeal- Nectar Cup -- Pharyngeal -- Pharyngeal- Nectar Straw -- Pharyngeal -- Pharyngeal- Thin Teaspoon -- Pharyngeal -- Pharyngeal- Thin Cup -- Pharyngeal -- Pharyngeal- Thin Straw -- Pharyngeal -- Pharyngeal- Puree  -- Pharyngeal -- Pharyngeal- Mechanical Soft -- Pharyngeal -- Pharyngeal- Regular -- Pharyngeal -- Pharyngeal- Multi-consistency -- Pharyngeal -- Pharyngeal- Pill -- Pharyngeal -- Pharyngeal Comment --  CHL IP CERVICAL ESOPHAGEAL PHASE 01/03/2016 Cervical Esophageal Phase WFL Pudding Teaspoon -- Pudding Cup -- Honey Teaspoon -- Honey Cup -- Nectar Teaspoon -- Nectar Cup -- Nectar Straw -- Thin Teaspoon -- Thin Cup -- Thin Straw -- Puree -- Mechanical Soft -- Regular -- Multi-consistency -- Pill -- Cervical Esophageal Comment -- CHL IP GO 01/03/2016 Functional Assessment Tool Used clinical judgement Functional Limitations Swallowing Swallow Current Status [id number]) CH Swallow Goal Status [id number]) [place] Swallow Discharge Status [id number]) [place] Motor Speech Current Status [id number]) (None) Motor Speech Goal Status [id number]) (None) Motor Speech Goal Status [id number]) (None) Spoken Language Comprehension Current Status [id number]) (None) Spoken Language Comprehension Goal Status [id number]) (None) Spoken Language Comprehension Discharge Status [id number]) (None) Spoken Language Expression Current Status [id number]) (None) Spoken Language Expression Goal Status [id number]) (None) Spoken Language Expression Discharge Status [phone]) (None) Attention Current Status [id number]) (None) Attention Goal Status [id number]) (None) Attention Discharge Status [id number]) (None) Memory Current Status [id number]) (None) Memory Goal Status [id number]) (None) Memory Discharge Status QC:[id number]) (None) Voice Current Status [id number]) (None) Voice Goal Status [id number]) (None) Voice Discharge Status [id number]) (None) Other Speech-Language Pathology Functional Limitation [id number]) (None) Other Speech-Language Pathology Functional Limitation Goal Status [id number]) (None) Other Speech-Language Pathology Functional Limitation Discharge Status [id number]) (None) DeBlois, Katherene Ponto 01/03/2016, 1:42 PM            CLINICAL DATA:  Food sticking in throat, lingular penetration on recent esophagram, voice changes months  asthmatic dysphonia, neuromuscular disorder, throat clearing with meals, history of Zenker's diverticulum status post surgery. EXAM: MODIFIED BARIUM SWALLOW TECHNIQUE: Different consistencies of barium were administered orally to the patient by the Speech Pathologist. Imaging of the pharynx was performed in the lateral projection. FLUOROSCOPY TIME:  Fluoroscopy Time:  1 minutes 15 seconds Number of Acquired Images:  Screen captures only COMPARISON:  None. FINDINGS: Thin liquid- within normal limits Pure- within normal limits Pure with cracker- within normal limits Barium tablet -  within normal limits IMPRESSION: Normal modified barium swallow. Please refer to the Speech Pathologists report for complete details and recommendations. Electronically Signed   By: Julian Hy M.D.   On: 01/03/2016 13:34    ASSESSMENT: 70 y.o. [place] woman with a history of recurrent PE as follows:  (1) multiple low-volume PE noted on chest  CT/angio 12/06/2014, 10 days after laparoscopic appendectomy for a perforated appendix with phlegmon (a) bilateral LE dopplers 12/06/2014 negative (b) bilateral UE dopplers 12/07/2014 showed superficial R cephalic v clot (c) treatment: warfarin x one year, monitored through [place] (d) repeat CT angio 12/23/2015 showed prior PEs cleared; also CT/angio 05/15/2014 negative  (2) bilateral PEs documented by Ct/angio03/24/2016, day 3 post R total knee (a) started on rivaroxaban/ Xarelto, switched by PCP to dabigratan/ Pradaxa (b) repeat CT/angio 06/03/2015 shows resolution of prior PE  (3) hypercoagulable panel 06/05/2015 including a homocystine level, a screen for the lupus anticoagulant, beta 2 glycoprotein studies, anti-cardiolipin antibodies, and studies for the factor V Leiden and prothrombin gene mutation found no actionable abnormalities  (a) the anticardiolipin IgM antibody 06/05/2015 was  34 ("low medium positive"), repeat 07/18/2015 was 38  PLAN: [name]'s case was reviewed with Dr. [name]. So far, he does not feel comfortable with her off of the pradaxa as her d-dimer has yet to normalize. The patient raised concerns about the price of the drug, but when I offered to put her on coumadin instead she declined, wishing to avoid the weekly lab draws. I have sent a refill for 75mg  BID to her mail order pharmacy.   [name] will continue monthly lab draws so that we might monitor her d-dimer. She will follow up with Dr. [name] in 6 months. She understands and agrees with this plan. She has been encouraged to call with any issues that might arise before her next visit here.   Total time spent in appointment was 25 minutes, with greater than 50% of the time spent face to face with the patient.   Laurie Panda, NP   01/10/2016 3:41 PM

## 2016-01-11 ENCOUNTER — Other Ambulatory Visit: Payer: Self-pay | Admitting: Neurology

## 2016-01-11 LAB — D-DIMER, QUANTITATIVE: D-DIMER: 0.48 mg/L FEU (ref 0.00–0.49)

## 2016-01-12 LAB — CARDIOLIPIN ANTIBODIES, IGG, IGM, IGA
Anticardiolipin Ab,IgA,Qn: <9 APL U/mL (ref 0–11)
Anticardiolipin Ab,IgG,Qn: <9 GPL U/mL (ref 0–14)
Anticardiolipin Ab,IgM,Qn: 30 MPL U/mL — ABNORMAL HIGH (ref 0–12)

## 2016-01-16 ENCOUNTER — Other Ambulatory Visit: Payer: Self-pay

## 2016-01-27 DIAGNOSIS — Z79899 Other long term (current) drug therapy: Secondary | ICD-10-CM | POA: Diagnosis not present

## 2016-01-27 DIAGNOSIS — M792 Neuralgia and neuritis, unspecified: Secondary | ICD-10-CM | POA: Diagnosis not present

## 2016-01-27 DIAGNOSIS — N182 Chronic kidney disease, stage 2 (mild): Secondary | ICD-10-CM | POA: Diagnosis not present

## 2016-01-27 DIAGNOSIS — E559 Vitamin D deficiency, unspecified: Secondary | ICD-10-CM | POA: Diagnosis not present

## 2016-01-27 DIAGNOSIS — E785 Hyperlipidemia, unspecified: Secondary | ICD-10-CM | POA: Diagnosis not present

## 2016-01-27 DIAGNOSIS — E1122 Type 2 diabetes mellitus with diabetic chronic kidney disease: Secondary | ICD-10-CM | POA: Diagnosis not present

## 2016-01-27 DIAGNOSIS — E890 Postprocedural hypothyroidism: Secondary | ICD-10-CM | POA: Diagnosis not present

## 2016-02-01 ENCOUNTER — Ambulatory Visit: Payer: Self-pay | Admitting: Neurology

## 2016-02-01 ENCOUNTER — Telehealth: Payer: Self-pay | Admitting: *Deleted

## 2016-02-01 NOTE — Telephone Encounter (Signed)
Patient canceled the morning of her appointment due to back pain.

## 2016-02-02 ENCOUNTER — Encounter: Payer: Self-pay | Admitting: Neurology

## 2016-02-04 ENCOUNTER — Other Ambulatory Visit: Payer: Self-pay | Admitting: Neurology

## 2016-02-06 ENCOUNTER — Other Ambulatory Visit: Payer: Self-pay

## 2016-02-08 ENCOUNTER — Telehealth: Payer: Self-pay | Admitting: *Deleted

## 2016-02-08 NOTE — Telephone Encounter (Signed)
Taken 24-MAY-17 at 10:18AM by RMS ------------------------------------------------------------ Caller Damaris Hippo                 CID [id number]  Patient SAME                 Pt's Dr [name]          Area Code 336 Phone# [phone] [place]                                                                                            Disp:Y/N N If Y = C/B If No Response In 67minutes ============================================================ L/V/M. ds

## 2016-02-10 ENCOUNTER — Other Ambulatory Visit (HOSPITAL_BASED_OUTPATIENT_CLINIC_OR_DEPARTMENT_OTHER): Payer: Medicare Other

## 2016-02-10 DIAGNOSIS — J45909 Unspecified asthma, uncomplicated: Secondary | ICD-10-CM | POA: Diagnosis not present

## 2016-02-10 DIAGNOSIS — D689 Coagulation defect, unspecified: Secondary | ICD-10-CM

## 2016-02-10 DIAGNOSIS — R300 Dysuria: Secondary | ICD-10-CM | POA: Diagnosis not present

## 2016-02-10 DIAGNOSIS — I2609 Other pulmonary embolism with acute cor pulmonale: Secondary | ICD-10-CM | POA: Diagnosis not present

## 2016-02-10 DIAGNOSIS — S0990XS Unspecified injury of head, sequela: Secondary | ICD-10-CM | POA: Diagnosis not present

## 2016-02-10 LAB — CBC WITH DIFFERENTIAL/PLATELET
BASO%: 1 % (ref 0.0–2.0)
Basophils Absolute: 0.1 10*3/uL (ref 0.0–0.1)
EOS%: 2.5 % (ref 0.0–7.0)
Eosinophils Absolute: 0.2 10*3/uL (ref 0.0–0.5)
HCT: 43.2 % (ref 34.8–46.6)
HGB: 13.7 g/dL (ref 11.6–15.9)
LYMPH%: 19.6 % (ref 14.0–49.7)
MCH: 26.3 pg (ref 25.1–34.0)
MCHC: 31.7 g/dL (ref 31.5–36.0)
MCV: 82.7 fL (ref 79.5–101.0)
MONO#: 0.7 10*3/uL (ref 0.1–0.9)
MONO%: 9.9 % (ref 0.0–14.0)
NEUT#: 4.6 10*3/uL (ref 1.5–6.5)
NEUT%: 67 % (ref 38.4–76.8)
Platelets: 270 10*3/uL (ref 145–400)
RBC: 5.23 10*6/uL (ref 3.70–5.45)
RDW: 16.5 % — ABNORMAL HIGH (ref 11.2–14.5)
WBC: 6.9 10*3/uL (ref 3.9–10.3)
lymph#: 1.3 10*3/uL (ref 0.9–3.3)

## 2016-02-10 LAB — COMPREHENSIVE METABOLIC PANEL
ALT: 12 U/L (ref 0–55)
AST: 17 U/L (ref 5–34)
Albumin: 3.9 g/dL (ref 3.5–5.0)
Alkaline Phosphatase: 110 U/L (ref 40–150)
Anion Gap: 10 mEq/L (ref 3–11)
BUN: 7.2 mg/dL (ref 7.0–26.0)
CO2: 25 mEq/L (ref 22–29)
Calcium: 9.4 mg/dL (ref 8.4–10.4)
Chloride: 106 mEq/L (ref 98–109)
Creatinine: 0.9 mg/dL (ref 0.6–1.1)
EGFR: 67 mL/min/{1.73_m2} — ABNORMAL LOW (ref >90)
Glucose: 99 mg/dl (ref 70–140)
Potassium: 4.5 mEq/L (ref 3.5–5.1)
Sodium: 142 mEq/L (ref 136–145)
Total Bilirubin: 0.37 mg/dL (ref 0.20–1.20)
Total Protein: 8 g/dL (ref 6.4–8.3)

## 2016-02-11 LAB — D-DIMER, QUANTITATIVE: D-DIMER: 0.68 mg/L FEU — ABNORMAL HIGH (ref 0.00–0.49)

## 2016-02-14 ENCOUNTER — Other Ambulatory Visit: Payer: Self-pay | Admitting: Oncology

## 2016-02-14 LAB — CARDIOLIPIN ANTIBODIES, IGG, IGM, IGA
Anticardiolipin Ab,IgA,Qn: <9 APL U/mL (ref 0–11)
Anticardiolipin Ab,IgG,Qn: <9 GPL U/mL (ref 0–14)
Anticardiolipin Ab,IgM,Qn: 20 MPL U/mL — ABNORMAL HIGH (ref 0–12)

## 2016-02-21 DIAGNOSIS — M545 Low back pain: Secondary | ICD-10-CM | POA: Diagnosis not present

## 2016-02-23 DIAGNOSIS — Z789 Other specified health status: Secondary | ICD-10-CM | POA: Diagnosis not present

## 2016-02-23 DIAGNOSIS — Z0000 Encounter for general adult medical examination without abnormal findings: Secondary | ICD-10-CM | POA: Diagnosis not present

## 2016-02-23 DIAGNOSIS — M545 Low back pain: Secondary | ICD-10-CM | POA: Diagnosis not present

## 2016-02-23 DIAGNOSIS — Z23 Encounter for immunization: Secondary | ICD-10-CM | POA: Diagnosis not present

## 2016-03-05 ENCOUNTER — Other Ambulatory Visit: Payer: Self-pay

## 2016-03-08 DIAGNOSIS — M545 Low back pain: Secondary | ICD-10-CM | POA: Diagnosis not present

## 2016-03-08 DIAGNOSIS — M4186 Other forms of scoliosis, lumbar region: Secondary | ICD-10-CM | POA: Diagnosis not present

## 2016-03-08 DIAGNOSIS — M5442 Lumbago with sciatica, left side: Secondary | ICD-10-CM | POA: Diagnosis not present

## 2016-03-12 ENCOUNTER — Encounter: Payer: Self-pay | Admitting: Neurology

## 2016-03-12 ENCOUNTER — Other Ambulatory Visit (HOSPITAL_BASED_OUTPATIENT_CLINIC_OR_DEPARTMENT_OTHER): Payer: Medicare Other

## 2016-03-12 ENCOUNTER — Ambulatory Visit (INDEPENDENT_AMBULATORY_CARE_PROVIDER_SITE_OTHER): Payer: Medicare Other | Admitting: Neurology

## 2016-03-12 VITALS — BP 122/77 | HR 81 | Ht 66.5 in | Wt 234.5 lb

## 2016-03-12 DIAGNOSIS — I2609 Other pulmonary embolism with acute cor pulmonale: Secondary | ICD-10-CM

## 2016-03-12 DIAGNOSIS — M545 Low back pain, unspecified: Secondary | ICD-10-CM | POA: Insufficient documentation

## 2016-03-12 DIAGNOSIS — D689 Coagulation defect, unspecified: Secondary | ICD-10-CM

## 2016-03-12 DIAGNOSIS — S0990XS Unspecified injury of head, sequela: Secondary | ICD-10-CM

## 2016-03-12 DIAGNOSIS — M5441 Lumbago with sciatica, right side: Secondary | ICD-10-CM

## 2016-03-12 DIAGNOSIS — G250 Essential tremor: Secondary | ICD-10-CM | POA: Diagnosis not present

## 2016-03-12 DIAGNOSIS — W19XXXA Unspecified fall, initial encounter: Secondary | ICD-10-CM | POA: Insufficient documentation

## 2016-03-12 DIAGNOSIS — J383 Other diseases of vocal cords: Secondary | ICD-10-CM

## 2016-03-12 DIAGNOSIS — W19XXXS Unspecified fall, sequela: Secondary | ICD-10-CM

## 2016-03-12 LAB — CBC WITH DIFFERENTIAL/PLATELET
BASO%: 0.7 % (ref 0.0–2.0)
Basophils Absolute: 0.1 10*3/uL (ref 0.0–0.1)
EOS%: 0.8 % (ref 0.0–7.0)
Eosinophils Absolute: 0.1 10*3/uL (ref 0.0–0.5)
HCT: 43.7 % (ref 34.8–46.6)
HGB: 14 g/dL (ref 11.6–15.9)
LYMPH%: 15.9 % (ref 14.0–49.7)
MCH: 26.6 pg (ref 25.1–34.0)
MCHC: 32 g/dL (ref 31.5–36.0)
MCV: 83 fL (ref 79.5–101.0)
MONO#: 0.8 10*3/uL (ref 0.1–0.9)
MONO%: 7.7 % (ref 0.0–14.0)
NEUT#: 8.1 10*3/uL — ABNORMAL HIGH (ref 1.5–6.5)
NEUT%: 74.9 % (ref 38.4–76.8)
Platelets: 302 10*3/uL (ref 145–400)
RBC: 5.27 10*6/uL (ref 3.70–5.45)
RDW: 16.6 % — ABNORMAL HIGH (ref 11.2–14.5)
WBC: 10.9 10*3/uL — ABNORMAL HIGH (ref 3.9–10.3)
lymph#: 1.7 10*3/uL (ref 0.9–3.3)

## 2016-03-12 LAB — COMPREHENSIVE METABOLIC PANEL
ALT: 17 U/L (ref 0–55)
AST: 16 U/L (ref 5–34)
Albumin: 3.7 g/dL (ref 3.5–5.0)
Alkaline Phosphatase: 97 U/L (ref 40–150)
Anion Gap: 9 mEq/L (ref 3–11)
BUN: 11.5 mg/dL (ref 7.0–26.0)
CO2: 30 mEq/L — ABNORMAL HIGH (ref 22–29)
Calcium: 9.3 mg/dL (ref 8.4–10.4)
Chloride: 100 mEq/L (ref 98–109)
Creatinine: 0.9 mg/dL (ref 0.6–1.1)
EGFR: 69 mL/min/{1.73_m2} — ABNORMAL LOW (ref >90)
Glucose: 139 mg/dl (ref 70–140)
Potassium: 4.2 mEq/L (ref 3.5–5.1)
Sodium: 139 mEq/L (ref 136–145)
Total Bilirubin: 0.31 mg/dL (ref 0.20–1.20)
Total Protein: 7.8 g/dL (ref 6.4–8.3)

## 2016-03-12 MED ORDER — DULOXETINE HCL 60 MG PO CPEP: 60.0000 mg | ORAL_CAPSULE | Freq: Every morning | ORAL | Status: DC

## 2016-03-12 MED ORDER — CYCLOBENZAPRINE HCL 10 MG PO TABS: 10.0000 mg | ORAL_TABLET | Freq: Every day | ORAL | Status: DC

## 2016-03-12 MED ORDER — PROPRANOLOL HCL 60 MG PO TABS: 60.0000 mg | ORAL_TABLET | Freq: Two times a day (BID) | ORAL | Status: DC

## 2016-03-12 NOTE — Progress Notes (Signed)
Chief Complaint  Patient presents with  . Tremors    Reports tremors in her bilateral hands have worsened since last seen.  She is currenlty taking propranolol 40mg , BID.  She felt Primidone 50mg , BID was not helpful.  . Back/Neck Pain    She is currently under the care of Dr. Rolena Infante at [facility] and Dr. Dorisann Frames at [facility].       PATIENT: Hayley [name] DOB: 06/30/1946  Chief Complaint  Patient presents with  . Tremors    Reports tremors in her bilateral hands have worsened since last seen.  She is currenlty taking propranolol 40mg , BID.  She felt Primidone 50mg , BID was not helpful.  . Back/Neck Pain    She is currently under the care of Dr. Rolena Infante at [facility] and Dr. Dorisann Frames at [facility].      HISTORICAL  Hayley [name] is a 70 years old right-handed female, seen in refer by hand surgeon Dr. Daryll Brod, and her primary care PA Flora Lipps for evaluation of neck pain, radiating pain to bilateral shoulder, upper extremity  She has past medical history of spasmodic dysphonia, status post surgical correction, also had a history of hypertension, diabetes, DVT, PE following surgical procedure, most recent was right knee replacement in March 2016, is taking anticoagulation pradaxa, chronic low back pain, previous history of epidural injection for low back pain.  She fell from law mower in June 2016, landed on her back, she was able to got up, finished mowing her lawn, later that day, she noticed worsening low back pain, significant neck pain, radiating pain to her bilateral shoulder, upper extremity, hands, she also noticed bilateral hands paresthesia, burning sensation, subjective weakness, she had worsening urinary urgency, wear pads occasionally, mild unbalanced gait, mild constipation  She was evaluated by [facility] orthopedic surgeon Dr. Rolena Infante for her worsening low back pain, reported MRI of lumbar spine at [place] in July 2016,  was told she is not a surgical candidate, she does has multilevel lumbar degenerative disc disease.  She is also concerned about her bilateral hands tremor, getting worse since 2015, most noticeable when she writes, holding utensils, there was no similar family history  UPDATE August 24th 2016: She continues to complain bilateral neck pain, radiating pain to bilateral shoulder, arm, We have reviewed MRI cervical spine (without) demonstrating: 1. At C3-4: disc bulging and uncovertebral joint hypertrophy and facet hypertrophy with severe left foraminal stenosis 2. At C4-5: disc bulging and uncovertebral joint hypertrophy with severe biforaminal stenosis 3. At C5-6: disc bulging and uncovertebral joint hypertrophy with moderate right and severe left foraminal stenosis 4. At C6-7: disc bulging and uncovertebral joint hypertrophy with moderate biforaminal stenosis 5. No intrinsic or compressive spinal cord lesions  Propanolol 40 mg twice a day has helped her tremor, she denies significant side effect.  UPDATE June 26th 2017: She continue complains of bilateral lower extremity posturing tremor, significant neck pain, radiating pain to bilateral upper extremity, increased unsteady gait, she has fell few times, worsening bilateral lower extremity paresthesia   REVIEW OF SYSTEMS: Full 14 system review of systems performed and notable only for fatigue, excessive sweating, ringing ears, trouble swallowing, wheezing, shortness of breath, swollen abdomen, constipation, diarrhea, daytime sleepiness, incontinence of bladder, back pain, achy muscles, walking difficulty, neck pain, memory loss, headache, numbness, weakness, tremor, facial drooling  ALLERGIES: Allergies  Allergen Reactions  . Tape Rash    Rash with paper tape  . Bee Venom  unknown  . Etodolac Nausea And Vomiting  . Morphine Other (See Comments)    Hallucinations  . Other Itching and Swelling  . Propoxyphene   . Propoxyphene  N-Acetaminophen Nausea And Vomiting  . Poison Ivy Extract  [Extract Of Poison Ivy] Itching and Swelling  . Penicillins Rash         HOME MEDICATIONS: Current Outpatient Prescriptions  Medication Sig Dispense Refill  . albuterol (PROVENTIL HFA;VENTOLIN HFA) 108 (90 BASE) MCG/ACT inhaler Inhale 1 puff into the lungs every 6 (six) hours as needed for wheezing or shortness of breath.    [name] albuterol (PROVENTIL) (2.5 MG/3ML) 0.083% nebulizer solution Take 2.5 mg by nebulization every 6 (six) hours as needed for wheezing or shortness of breath.    . benzonatate (TESSALON) 100 MG capsule Take 100 mg by mouth 3 (three) times daily as needed for cough.    . clonazePAM (KLONOPIN) 0.5 MG tablet Take 0.5 mg by mouth at bedtime.    . cyclobenzaprine (FLEXERIL) 10 MG tablet Take 10 mg by mouth at bedtime.     [name] EPINEPHrine (EPIPEN) 0.3 mg/0.3 mL DEVI Inject 0.3 mg into the muscle once as needed (Allergic reaction).     . fluorometholone (FML) 0.1 % ophthalmic suspension Place 1 drop into both eyes daily as needed (Eye allergies).   0  . fluticasone (FLONASE) 50 MCG/ACT nasal spray Place 1 spray into both nostrils daily as needed for allergies or rhinitis.    [name] gabapentin (NEURONTIN) 800 MG tablet Take 800-1,600 mg by mouth 2 (two) times daily. 800 in the morning and 1600 at pm    . HYDROcodone-acetaminophen (NORCO/VICODIN) 5-325 MG per tablet Take one to two tablets by mouth every 4 hours as needed for pain. Do not exceed 4gm of Tylenol in 24 hours 360 tablet 0  . levothyroxine (SYNTHROID, LEVOTHROID) 25 MCG tablet Take 25 mcg by mouth every morning.     . methocarbamol (ROBAXIN) 500 MG tablet Take 1 tablet (500 mg total) by mouth every 6 (six) hours as needed for muscle spasms. 90 tablet 0  . mometasone-formoterol (DULERA) 100-5 MCG/ACT AERO Inhale 2 puffs into the lungs 2 (two) times daily.    [name] nystatin (MYCOSTATIN) 100000 UNIT/ML suspension Take 5 mLs by mouth daily as needed (Yeast).    . ondansetron  (ZOFRAN) 4 MG tablet Take 4 mg by mouth every 8 (eight) hours as needed for nausea or vomiting.    . ondansetron (ZOFRAN-ODT) 8 MG disintegrating tablet Take 8 mg by mouth every 8 (eight) hours as needed for nausea or vomiting.    [name] oxyCODONE (OXY IR/ROXICODONE) 5 MG immediate release tablet Take 1-2 tablets (5-10 mg total) by mouth every 3 (three) hours as needed for moderate pain or severe pain. 90 tablet 0  . pantoprazole (PROTONIX) 40 MG tablet Take 40 mg by mouth daily.    . Phenazopyridine HCl (AZO TABS PO) Take 1 tablet by mouth daily as needed (UTI).    . primidone (MYSOLINE) 50 MG tablet Take 50 mg by mouth at bedtime.    . promethazine (PHENERGAN) 25 MG tablet Take 25 mg by mouth every 6 (six) hours as needed for nausea or vomiting.    . Rivaroxaban (XARELTO) 15 MG TABS tablet Take 1 tablet (15 mg total) by mouth 2 (two) times daily with a meal. 60 tablet 5  . rizatriptan (MAXALT) 10 MG tablet Take 10 mg by mouth as needed for migraine (migraine). May repeat in 2 hours if needed    .  sertraline (ZOLOFT) 100 MG tablet Take 100 mg by mouth daily.    . sitaGLIPtin (JANUVIA) 100 MG tablet Take 100 mg by mouth every evening.     . SUMAtriptan (IMITREX) 6 MG/0.5ML SOLN injection Inject 6 mg into the skin every 2 (two) hours as needed for migraine or headache. May repeat in 2 hours if headache persists or recurs.    . valACYclovir (VALTREX) 1000 MG tablet Take 2,000 mg by mouth 2 (two) times daily as needed (Fever blisters).   2     PAST MEDICAL HISTORY: Past Medical History  Diagnosis Date  . Hyperthyroidism   . Toxic goiter   . Reflux   . Colon polyps   . Migraines   . 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 Obesity, Class III, BMI 40-49.9 (morbid obesity) ([place]) 04/27/2010    02-17-14 reports some weight loss- intentional  . Pelvic pain     02-17-14 "states thinks its back related on left groin"  . Anemia   . Clotting disorder ([place])  02-17-14    S/p appendectomy-developed Pulmonary emboli-tx. warfarin-d/c 1 yr ago.  . Osteoarthritis     in back(chronic pain)  . Pulmonary emboli [facility])     s/p Appendectomy '13 [facility]  . Diabetes mellitus without complication ([place])     "borderline"- oral med  . Right knee pain   . Sleep apnea     cpap - settings at 4   . Bronchitis     hx of   . Anxiety   . Depression   . Kidney stones   . Neuromuscular disorder ([place])     hands and throat"spasmodic dysphonia", tremors - thimb and forefinger   . Nausea Nov 15, 2014    cycle of migraine headaches and nausea    PAST SURGICAL HISTORY: Past Surgical History  Procedure Laterality Date  . Abdominal hysterectomy    . Zenkers diverticulum    . Kidney stone surgery    . Laparoscopic appendectomy  11/27/2011    Procedure: APPENDECTOMY LAPAROSCOPIC;  Surgeon: Adin Hector, MD;  Location: [facility] ORS;  Service: General;  Laterality: N/A;  . Cholecystectomy    . Knee arthroscopy  1998    right  . Hand surgery  2004    both hands  . Botox injection      for migranes  . Cataract extraction, bilateral Bilateral 03/2013   . Vocal cord surgery  02-17-14    01-12-14 done in [place] [facility](s/p selective denervation/reinnervation recurrent laryngeal nerve surgery)  . Appendectomy      3'13 [facility] s/p developed  Pulmonary Emboli  . Total knee revision Left 02/24/2014    Procedure: LEFT TOTAL KNEE REVISION;  Surgeon: Gearlean Alf, MD;  Location: [facility] ORS;  Service: Orthopedics;  Laterality: Left;  . Total knee arthroplasty Right 12/06/2014    Procedure: RIGHT TOTAL KNEE ARTHROPLASTY;  Surgeon: Gaynelle Arabian, MD;  Location: [facility]S;  Service: Orthopedics;  Laterality: Right;    FAMILY HISTORY: Family History  Problem Relation Age of Onset  . Heart disease Mother   . Pneumonia Father   . Cirrhosis Father   . Hypertension Father   . Heart attack Paternal Grandfather   . Heart attack Maternal Grandfather   . Heart attack Paternal Grandmother   .  Heart attack Maternal Grandmother   . Alcoholism Father     SOCIAL HISTORY:  Social History   Social History  . Marital Status:  Married    Spouse Name: N/A  . Number of Children: 5  . Years of Education: 16   Occupational History  . Retired    Social History Main Topics  . Smoking status: Former Smoker    Quit date: 01/03/1982  . Smokeless tobacco: Never Used  . Alcohol Use: No  . Drug Use: No  . Sexual Activity: Not on file   Other Topics Concern  . Not on file   Social History Narrative   Lives at home with husband.   Right-handed.   2 cups caffeine per day.     PHYSICAL EXAM   Filed Vitals:   03/12/16 1603  BP: 122/77  Pulse: 81  Height: 5' 6.5" (1.689 m)  Weight: 234 lb 8 oz (106.369 kg)    Not recorded      Body mass index is 37.29 kg/(m^2).  PHYSICAL EXAMNIATION:  Gen: NAD, conversant, well nourised, obese, well groomed                     Cardiovascular: Regular rate rhythm, no peripheral edema, warm, nontender. Eyes: Conjunctivae clear without exudates or hemorrhage Neck: Supple, no carotid bruise. Pulmonary: Clear to auscultation bilaterally   NEUROLOGICAL EXAM:  MENTAL STATUS: Speech:    Mild dysphonic speech; fluent and spontaneous with normal comprehension.  Cognition:     Orientation to time, place and person     Normal recent and remote memory     Normal Attention span and concentration     Normal Language, naming, repeating,spontaneous speech     Fund of knowledge   CRANIAL NERVES: CN II: Visual fields are full to confrontation. Fundoscopic exam is normal with sharp discs and no vascular changes. Pupils are round equal and briskly reactive to light. CN III, IV, VI: extraocular movement are normal. No ptosis. CN V: Facial sensation is intact to pinprick in all 3 divisions bilaterally. Corneal responses are intact.  CN VII: Face is symmetric with normal eye closure and smile. CN VIII: Hearing is normal to rubbing fingers CN IX,  X: Palate elevates symmetrically. Phonation is normal. CN XI: Head turning and shoulder shrug are intact CN XII: Tongue is midline with normal movements and no atrophy.  MOTOR: Mild bilateral hands postural tremor, normal muscle tone, bulk, strength   REFLEXES: Reflexes are 2+ and symmetric at the biceps, triceps, knees, and ankles. Plantar responses are flexor.  SENSORY: Intact to light touch, pinprick, position sense, and vibration sense are intact in fingers and toes.  COORDINATION: Rapid alternating movements and fine finger movements are intact. There is no dysmetria on finger-to-nose and heel-knee-shin.    GAIT/STANCE: Posture is normal. Gait is cautious, mildly unsteady  DIAGNOSTIC DATA (LABS, IMAGING, TESTING) - I reviewed patient records, labs, notes, testing and imaging myself where available.   ASSESSMENT AND PLAN  Hayley [name] is a 70 y.o. female   Left cervical radiculopathy:  We have reviewed MRI cervical spine: Multilevel degenerative disc disease, severe foraminal stenosis at left C3-4, bilateral C4-5, left C5-6,:  Keep current dose of gabapentin 800 mg 1 in the morning, 2 at night time, Robaxin as needed  Essential Tremor:  Tried primidone in the past without significant improvement, primidone has the potential interaction with her polypharmacy  I increased propanolol from 40 mg to 60 mg twice a day  Chronic anticoagulation:  History of DVT, PE, following surgical procedure, most recent one was March 2016 following her right knee replacement  On chronic pradaxa  treatment  Worsening bilateral lower extremity paresthesia, gait abnormality, falling episodes  EMG nerve conduction study for evaluation of peripheral neuropathy, rule out lumbosacral radiculopathy    Marcial Pacas, M.D. Ph.D.  [facility] [address], [place], [place] [address] Ph: [phone] Fax: [phone]  CC: To Dr. Daryll Brod, Flora Lipps

## 2016-03-13 LAB — D-DIMER, QUANTITATIVE: D-DIMER: 0.55 mg/L FEU — ABNORMAL HIGH (ref 0.00–0.49)

## 2016-03-14 LAB — CARDIOLIPIN ANTIBODIES, IGG, IGM, IGA
Anticardiolipin Ab,IgA,Qn: <9 APL U/mL (ref 0–11)
Anticardiolipin Ab,IgG,Qn: <9 GPL U/mL (ref 0–14)
Anticardiolipin Ab,IgM,Qn: 34 MPL U/mL — ABNORMAL HIGH (ref 0–12)

## 2016-03-19 ENCOUNTER — Other Ambulatory Visit: Payer: Self-pay | Admitting: *Deleted

## 2016-03-19 MED ORDER — PROPRANOLOL HCL 60 MG PO TABS: 60.0000 mg | ORAL_TABLET | Freq: Two times a day (BID) | ORAL | Status: DC

## 2016-03-20 ENCOUNTER — Other Ambulatory Visit: Payer: Self-pay | Admitting: Oncology

## 2016-03-22 ENCOUNTER — Observation Stay (HOSPITAL_COMMUNITY): Payer: Medicare Other

## 2016-03-22 ENCOUNTER — Emergency Department (HOSPITAL_COMMUNITY): Payer: Medicare Other

## 2016-03-22 ENCOUNTER — Encounter (HOSPITAL_COMMUNITY): Payer: Self-pay | Admitting: *Deleted

## 2016-03-22 ENCOUNTER — Observation Stay (HOSPITAL_COMMUNITY)
Admission: EM | Admit: 2016-03-22 | Discharge: 2016-03-23 | Disposition: A | Discharge status: Home / Self Care | Payer: Medicare Other | Attending: Internal Medicine | Admitting: Internal Medicine

## 2016-03-22 DIAGNOSIS — G4733 Obstructive sleep apnea (adult) (pediatric): Secondary | ICD-10-CM | POA: Diagnosis not present

## 2016-03-22 DIAGNOSIS — J383 Other diseases of vocal cords: Secondary | ICD-10-CM

## 2016-03-22 DIAGNOSIS — N200 Calculus of kidney: Secondary | ICD-10-CM

## 2016-03-22 DIAGNOSIS — D649 Anemia, unspecified: Secondary | ICD-10-CM | POA: Diagnosis not present

## 2016-03-22 DIAGNOSIS — R531 Weakness: Secondary | ICD-10-CM | POA: Diagnosis not present

## 2016-03-22 DIAGNOSIS — F341 Dysthymic disorder: Secondary | ICD-10-CM

## 2016-03-22 DIAGNOSIS — G8929 Other chronic pain: Secondary | ICD-10-CM | POA: Insufficient documentation

## 2016-03-22 DIAGNOSIS — Z9989 Dependence on other enabling machines and devices: Secondary | ICD-10-CM

## 2016-03-22 DIAGNOSIS — Z6841 Body Mass Index (BMI) 40.0 and over, adult: Secondary | ICD-10-CM | POA: Insufficient documentation

## 2016-03-22 DIAGNOSIS — M5412 Radiculopathy, cervical region: Secondary | ICD-10-CM | POA: Diagnosis not present

## 2016-03-22 DIAGNOSIS — J45909 Unspecified asthma, uncomplicated: Secondary | ICD-10-CM | POA: Diagnosis not present

## 2016-03-22 DIAGNOSIS — Z86711 Personal history of pulmonary embolism: Secondary | ICD-10-CM | POA: Diagnosis not present

## 2016-03-22 DIAGNOSIS — E66813 Obesity, class 3: Secondary | ICD-10-CM

## 2016-03-22 DIAGNOSIS — R200 Anesthesia of skin: Secondary | ICD-10-CM | POA: Insufficient documentation

## 2016-03-22 DIAGNOSIS — Z885 Allergy status to narcotic agent status: Secondary | ICD-10-CM | POA: Diagnosis not present

## 2016-03-22 DIAGNOSIS — R29818 Other symptoms and signs involving the nervous system: Secondary | ICD-10-CM | POA: Diagnosis not present

## 2016-03-22 DIAGNOSIS — F329 Major depressive disorder, single episode, unspecified: Secondary | ICD-10-CM | POA: Diagnosis not present

## 2016-03-22 DIAGNOSIS — R29810 Facial weakness: Secondary | ICD-10-CM | POA: Insufficient documentation

## 2016-03-22 DIAGNOSIS — Z96651 Presence of right artificial knee joint: Secondary | ICD-10-CM | POA: Insufficient documentation

## 2016-03-22 DIAGNOSIS — N390 Urinary tract infection, site not specified: Secondary | ICD-10-CM

## 2016-03-22 DIAGNOSIS — I2782 Chronic pulmonary embolism: Secondary | ICD-10-CM | POA: Insufficient documentation

## 2016-03-22 DIAGNOSIS — E119 Type 2 diabetes mellitus without complications: Secondary | ICD-10-CM | POA: Diagnosis not present

## 2016-03-22 DIAGNOSIS — M545 Low back pain: Secondary | ICD-10-CM | POA: Insufficient documentation

## 2016-03-22 DIAGNOSIS — K3533 Acute appendicitis with perforation and localized peritonitis, with abscess: Secondary | ICD-10-CM

## 2016-03-22 DIAGNOSIS — G8191 Hemiplegia, unspecified affecting right dominant side: Secondary | ICD-10-CM | POA: Insufficient documentation

## 2016-03-22 DIAGNOSIS — F419 Anxiety disorder, unspecified: Secondary | ICD-10-CM | POA: Insufficient documentation

## 2016-03-22 DIAGNOSIS — Z880 Allergy status to penicillin: Secondary | ICD-10-CM | POA: Diagnosis not present

## 2016-03-22 DIAGNOSIS — E039 Hypothyroidism, unspecified: Secondary | ICD-10-CM | POA: Diagnosis not present

## 2016-03-22 DIAGNOSIS — E0500 Thyrotoxicosis with diffuse goiter without thyrotoxic crisis or storm: Secondary | ICD-10-CM | POA: Insufficient documentation

## 2016-03-22 DIAGNOSIS — R42 Dizziness and giddiness: Secondary | ICD-10-CM | POA: Diagnosis not present

## 2016-03-22 DIAGNOSIS — D62 Acute posthemorrhagic anemia: Secondary | ICD-10-CM

## 2016-03-22 DIAGNOSIS — I6789 Other cerebrovascular disease: Secondary | ICD-10-CM | POA: Diagnosis not present

## 2016-03-22 DIAGNOSIS — R4701 Aphasia: Secondary | ICD-10-CM | POA: Insufficient documentation

## 2016-03-22 DIAGNOSIS — G250 Essential tremor: Secondary | ICD-10-CM | POA: Diagnosis not present

## 2016-03-22 DIAGNOSIS — Z66 Do not resuscitate: Secondary | ICD-10-CM | POA: Diagnosis not present

## 2016-03-22 DIAGNOSIS — R202 Paresthesia of skin: Secondary | ICD-10-CM

## 2016-03-22 DIAGNOSIS — G4459 Other complicated headache syndrome: Secondary | ICD-10-CM | POA: Diagnosis not present

## 2016-03-22 DIAGNOSIS — G43909 Migraine, unspecified, not intractable, without status migrainosus: Secondary | ICD-10-CM | POA: Insufficient documentation

## 2016-03-22 DIAGNOSIS — Z87891 Personal history of nicotine dependence: Secondary | ICD-10-CM | POA: Diagnosis not present

## 2016-03-22 DIAGNOSIS — Z7901 Long term (current) use of anticoagulants: Secondary | ICD-10-CM | POA: Diagnosis not present

## 2016-03-22 DIAGNOSIS — I10 Essential (primary) hypertension: Secondary | ICD-10-CM | POA: Diagnosis not present

## 2016-03-22 DIAGNOSIS — M25562 Pain in left knee: Secondary | ICD-10-CM

## 2016-03-22 LAB — URINALYSIS, ROUTINE W REFLEX MICROSCOPIC
Bilirubin Urine: NEGATIVE
Glucose, UA: NEGATIVE mg/dL
Ketones, ur: NEGATIVE mg/dL
Nitrite: POSITIVE — AB
Protein, ur: NEGATIVE mg/dL
Specific Gravity, Urine: 1.007 (ref 1.005–1.030)
pH: 6.5 (ref 5.0–8.0)

## 2016-03-22 LAB — COMPREHENSIVE METABOLIC PANEL
ALT: 16 U/L (ref 14–54)
AST: 18 U/L (ref 15–41)
Albumin: 3.3 g/dL — ABNORMAL LOW (ref 3.5–5.0)
Alkaline Phosphatase: 77 U/L (ref 38–126)
Anion gap: 7 (ref 5–15)
BUN: 5 mg/dL — ABNORMAL LOW (ref 6–20)
CO2: 27 mmol/L (ref 22–32)
Calcium: 8.8 mg/dL — ABNORMAL LOW (ref 8.9–10.3)
Chloride: 104 mmol/L (ref 101–111)
Creatinine, Ser: 0.8 mg/dL (ref 0.44–1.00)
GFR calc Af Amer: >60 mL/min (ref >60)
GFR calc non Af Amer: >60 mL/min (ref >60)
Glucose, Bld: 103 mg/dL — ABNORMAL HIGH (ref 65–99)
Potassium: 4.1 mmol/L (ref 3.5–5.1)
Sodium: 138 mmol/L (ref 135–145)
Total Bilirubin: 0.4 mg/dL (ref 0.3–1.2)
Total Protein: 6.5 g/dL (ref 6.5–8.1)

## 2016-03-22 LAB — I-STAT CHEM 8, ED
BUN: 5 mg/dL — ABNORMAL LOW (ref 6–20)
Calcium, Ion: 1.09 mmol/L — ABNORMAL LOW (ref 1.12–1.23)
Chloride: 101 mmol/L (ref 101–111)
Creatinine, Ser: 0.7 mg/dL (ref 0.44–1.00)
Glucose, Bld: 98 mg/dL (ref 65–99)
HCT: 40 % (ref 36.0–46.0)
Hemoglobin: 13.6 g/dL (ref 12.0–15.0)
Potassium: 4 mmol/L (ref 3.5–5.1)
Sodium: 139 mmol/L (ref 135–145)
TCO2: 28 mmol/L (ref 0–100)

## 2016-03-22 LAB — GLUCOSE, CAPILLARY: Glucose-Capillary: 255 mg/dL — ABNORMAL HIGH (ref 65–99)

## 2016-03-22 LAB — PROTIME-INR
INR: 1.03 (ref 0.00–1.49)
Prothrombin Time: 13.7 seconds (ref 11.6–15.2)

## 2016-03-22 LAB — DIFFERENTIAL
Basophils Absolute: 0 10*3/uL (ref 0.0–0.1)
Basophils Relative: 0 %
Eosinophils Absolute: 0.2 10*3/uL (ref 0.0–0.7)
Eosinophils Relative: 3 %
Lymphocytes Relative: 20 %
Lymphs Abs: 1.6 10*3/uL (ref 0.7–4.0)
Monocytes Absolute: 0.6 10*3/uL (ref 0.1–1.0)
Monocytes Relative: 8 %
Neutro Abs: 5.6 10*3/uL (ref 1.7–7.7)
Neutrophils Relative %: 69 %

## 2016-03-22 LAB — URINE MICROSCOPIC-ADD ON

## 2016-03-22 LAB — CBG MONITORING, ED: Glucose-Capillary: 99 mg/dL (ref 65–99)

## 2016-03-22 LAB — CBC
HCT: 41.1 % (ref 36.0–46.0)
Hemoglobin: 12.8 g/dL (ref 12.0–15.0)
MCH: 27.1 pg (ref 26.0–34.0)
MCHC: 31.1 g/dL (ref 30.0–36.0)
MCV: 87.1 fL (ref 78.0–100.0)
Platelets: 230 10*3/uL (ref 150–400)
RBC: 4.72 MIL/uL (ref 3.87–5.11)
RDW: 15.9 % — ABNORMAL HIGH (ref 11.5–15.5)
WBC: 8 10*3/uL (ref 4.0–10.5)

## 2016-03-22 LAB — ETHANOL: Alcohol, Ethyl (B): <5 mg/dL (ref <5)

## 2016-03-22 LAB — APTT: aPTT: 27 seconds (ref 24–37)

## 2016-03-22 LAB — I-STAT TROPONIN, ED: Troponin i, poc: 0 ng/mL (ref 0.00–0.08)

## 2016-03-22 MED ORDER — BUPROPION HCL ER (XL) 150 MG PO TB24
150.0000 mg | ORAL_TABLET | Freq: Every morning | ORAL | Status: DC
  Administered 2016-03-23: 150 mg via ORAL
  Filled 2016-03-22: qty 1

## 2016-03-22 MED ORDER — LEVOTHYROXINE SODIUM 25 MCG PO TABS
25.0000 ug | ORAL_TABLET | Freq: Every day | ORAL | Status: DC
  Administered 2016-03-23: 25 ug via ORAL
  Filled 2016-03-22 (×2): qty 1

## 2016-03-22 MED ORDER — PROPRANOLOL HCL 60 MG PO TABS
60.0000 mg | ORAL_TABLET | Freq: Two times a day (BID) | ORAL | Status: DC
  Administered 2016-03-23: 60 mg via ORAL
  Filled 2016-03-22 (×3): qty 1

## 2016-03-22 MED ORDER — METOCLOPRAMIDE HCL 5 MG/ML IJ SOLN
10.0000 mg | Freq: Once | INTRAMUSCULAR | Status: AC
  Administered 2016-03-22: 10 mg via INTRAVENOUS
  Filled 2016-03-22: qty 2

## 2016-03-22 MED ORDER — ONDANSETRON HCL 4 MG/2ML IJ SOLN: 4.0000 mg | Freq: Four times a day (QID) | INTRAMUSCULAR | Status: DC | PRN

## 2016-03-22 MED ORDER — DULOXETINE HCL 60 MG PO CPEP
60.0000 mg | ORAL_CAPSULE | Freq: Every morning | ORAL | Status: DC
  Administered 2016-03-23: 60 mg via ORAL
  Filled 2016-03-22: qty 1

## 2016-03-22 MED ORDER — DIAZEPAM 2 MG PO TABS: 2.0000 mg | ORAL_TABLET | Freq: Four times a day (QID) | ORAL | Status: DC | PRN

## 2016-03-22 MED ORDER — ASPIRIN 325 MG PO TABS
325.0000 mg | ORAL_TABLET | Freq: Every day | ORAL | Status: DC
  Administered 2016-03-22 – 2016-03-23 (×2): 325 mg via ORAL
  Filled 2016-03-22 (×2): qty 1

## 2016-03-22 MED ORDER — CIPROFLOXACIN IN D5W 400 MG/200ML IV SOLN: 400.0000 mg | Freq: Two times a day (BID) | INTRAVENOUS | Status: DC

## 2016-03-22 MED ORDER — CIPROFLOXACIN IN D5W 400 MG/200ML IV SOLN
400.0000 mg | Freq: Once | INTRAVENOUS | Status: AC
  Administered 2016-03-22: 400 mg via INTRAVENOUS
  Filled 2016-03-22: qty 200

## 2016-03-22 MED ORDER — DIPHENHYDRAMINE HCL 50 MG/ML IJ SOLN
25.0000 mg | Freq: Once | INTRAMUSCULAR | Status: AC
  Administered 2016-03-22: 25 mg via INTRAVENOUS
  Filled 2016-03-22: qty 1

## 2016-03-22 MED ORDER — ENOXAPARIN SODIUM 120 MG/0.8ML ~~LOC~~ SOLN
110.0000 mg | Freq: Two times a day (BID) | SUBCUTANEOUS | Status: DC
  Administered 2016-03-23: 110 mg via SUBCUTANEOUS
  Filled 2016-03-22 (×2): qty 0.73
  Filled 2016-03-22: qty 0.8

## 2016-03-22 MED ORDER — GABAPENTIN 800 MG PO TABS
800.0000 mg | ORAL_TABLET | Freq: Every morning | ORAL | Status: DC
  Filled 2016-03-22: qty 1

## 2016-03-22 MED ORDER — STROKE: EARLY STAGES OF RECOVERY BOOK
Freq: Once | Status: AC
  Administered 2016-03-22: 20:00:00

## 2016-03-22 MED ORDER — CIPROFLOXACIN IN D5W 400 MG/200ML IV SOLN
400.0000 mg | Freq: Two times a day (BID) | INTRAVENOUS | Status: DC
  Administered 2016-03-23: 400 mg via INTRAVENOUS
  Filled 2016-03-22: qty 200

## 2016-03-22 MED ORDER — CYCLOBENZAPRINE HCL 10 MG PO TABS
10.0000 mg | ORAL_TABLET | Freq: Every day | ORAL | Status: DC
  Administered 2016-03-22: 10 mg via ORAL
  Filled 2016-03-22: qty 1

## 2016-03-22 MED ORDER — MECLIZINE HCL 12.5 MG PO TABS: 12.5000 mg | ORAL_TABLET | Freq: Three times a day (TID) | ORAL | Status: DC | PRN

## 2016-03-22 MED ORDER — GABAPENTIN 400 MG PO CAPS
1600.0000 mg | ORAL_CAPSULE | Freq: Every day | ORAL | Status: DC
  Administered 2016-03-22: 1600 mg via ORAL
  Filled 2016-03-22: qty 4

## 2016-03-22 MED ORDER — WARFARIN SODIUM 7.5 MG PO TABS
7.5000 mg | ORAL_TABLET | Freq: Once | ORAL | Status: DC
  Filled 2016-03-22: qty 1

## 2016-03-22 MED ORDER — ASPIRIN 300 MG RE SUPP: 300.0000 mg | Freq: Every day | RECTAL | Status: DC

## 2016-03-22 MED ORDER — MOMETASONE FURO-FORMOTEROL FUM 100-5 MCG/ACT IN AERO
2.0000 | INHALATION_SPRAY | Freq: Two times a day (BID) | RESPIRATORY_TRACT | Status: DC
  Administered 2016-03-22 – 2016-03-23 (×2): 2 via RESPIRATORY_TRACT
  Filled 2016-03-22: qty 8.8

## 2016-03-22 MED ORDER — INSULIN ASPART 100 UNIT/ML ~~LOC~~ SOLN
0.0000 [IU] | Freq: Every day | SUBCUTANEOUS | Status: DC
  Administered 2016-03-22: 3 [IU] via SUBCUTANEOUS

## 2016-03-22 MED ORDER — ALBUTEROL SULFATE (2.5 MG/3ML) 0.083% IN NEBU: 2.5000 mg | INHALATION_SOLUTION | Freq: Four times a day (QID) | RESPIRATORY_TRACT | Status: DC | PRN

## 2016-03-22 MED ORDER — WARFARIN - PHARMACIST DOSING INPATIENT: Freq: Every day | Status: DC

## 2016-03-22 MED ORDER — ALBUTEROL SULFATE HFA 108 (90 BASE) MCG/ACT IN AERS: 1.0000 | INHALATION_SPRAY | Freq: Four times a day (QID) | RESPIRATORY_TRACT | Status: DC | PRN

## 2016-03-22 MED ORDER — INSULIN ASPART 100 UNIT/ML ~~LOC~~ SOLN
0.0000 [IU] | Freq: Three times a day (TID) | SUBCUTANEOUS | Status: DC
  Administered 2016-03-23: 2 [IU] via SUBCUTANEOUS
  Administered 2016-03-23: 1 [IU] via SUBCUTANEOUS

## 2016-03-22 MED ORDER — LINAGLIPTIN 5 MG PO TABS
5.0000 mg | ORAL_TABLET | Freq: Every day | ORAL | Status: DC
  Filled 2016-03-22: qty 1

## 2016-03-22 MED ORDER — CLONAZEPAM 0.5 MG PO TABS
0.5000 mg | ORAL_TABLET | Freq: Every day | ORAL | Status: DC
  Administered 2016-03-22: 0.5 mg via ORAL
  Filled 2016-03-22: qty 1

## 2016-03-22 NOTE — ED Notes (Signed)
59M aware pt is at MRI prior to transport to floor.

## 2016-03-22 NOTE — Code Documentation (Signed)
70yo female arriving to [facility] via [facility] at 1250.  Patient from the doctor's office where she c/o right facial numbness, expressive aphasia and generalized weakness.  Patient reported dizziness and headache.  Of note, patient with h/o PE and DVT and stopped taking Pradaxa over 1 month ago d/t cost.  Patient was given ASA 324mg  and EMS was called.  EMS activated a code stroke.  Patient reported right arm numbness to EMS.  Patient to the ED.  Patient to CT.  Stroke team to the bedside.  Labs drawn in CT.  CT completed.  NIHSS 2, see documentation for details and code stroke times.  Patient with subjective right face and arm decreased sensation and mild dysarthria on exam.  Dr. Shon Hale at the bedside.  No acute stroke treatment at this time.  Bedside handoff with ED RN Hayley.

## 2016-03-22 NOTE — ED Notes (Signed)
Patient transported to MRI 

## 2016-03-22 NOTE — Progress Notes (Signed)
ANTICOAGULATION CONSULT NOTE - Initial Consult  Pharmacy Consult for lovenox Indication: hx PE  Allergies  Allergen Reactions  . Tape Rash    Rash with paper tape  . Bee Venom     unknown  . Etodolac Nausea And Vomiting  . Morphine Other (See Comments)    Hallucinations  . Other Itching and Swelling  . Propoxyphene   . Propoxyphene N-Acetaminophen Nausea And Vomiting  . Poison Ivy Extract  [Extract Of Poison Ivy] Itching and Swelling  . Penicillins Rash         Patient Measurements: Weight: 239 lb 6.7 oz (108.6 kg)  Vital Signs: Temp: 97.5 F (36.4 C) (07/06 1553) Temp Source: Oral (07/06 1553) BP: 101/62 mmHg (07/06 1606) Pulse Rate: 61 (07/06 1606)  Labs:  Recent Labs  03/22/16 1258 03/22/16 1303  HGB 12.8 13.6  HCT 41.1 40.0  PLT 230  --   APTT 27  --   LABPROT 13.7  --   INR 1.03  --   CREATININE 0.80 0.70    Estimated Creatinine Clearance: 81.2 mL/min (by C-G formula based on Cr of 0.7).   Medical History: Past Medical History  Diagnosis Date  . Hyperthyroidism   . Toxic goiter   . Reflux   . Colon polyps   . Migraines   . 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 Obesity, Class III, BMI 40-49.9 (morbid obesity) ([place]) 04/27/2010    02-17-14 reports some weight loss- intentional  . Pelvic pain     02-17-14 "states thinks its back related on left groin"  . Anemia   . Clotting disorder ([place]) 02-17-14    S/p appendectomy-developed Pulmonary emboli-tx. warfarin-d/c 1 yr ago.  . Osteoarthritis     in back(chronic pain)  . Pulmonary emboli [facility])     s/p Appendectomy '13 [facility]  . Diabetes mellitus without complication ([place])     "borderline"- oral med  . Right knee pain   . Sleep apnea     cpap - settings at 4   . Bronchitis     hx of   . Anxiety   . Depression   . Kidney stones   . Neuromuscular disorder ([place])     hands and throat"spasmodic dysphonia", tremors - thimb and forefinger   . Nausea Nov 15, 2014    cycle of migraine headaches and nausea   Assessment: 73 yof presented as a code stroke. CT of the head was negative and per MD, likely vertigo rather than a stroke. Pt is on chronic pradaxa for history of PE but she has not been taking it. Baseline INR and CBC is WNL. No bleeding noted. To start SQ lovenox for now.   Goal of Therapy:  Anti-Xa level 0.6-1 units/ml 4hrs after LMWH dose given Monitor platelets by anticoagulation protocol: Yes   Plan:  - Lovenox 110mg  SQ Q12H - CBC Q72H - F/u plans for oral anticoagulation  Sheletha Bow, Rande Lawman 03/22/2016,4:19 PM

## 2016-03-22 NOTE — Consult Note (Signed)
Requesting Physician: Dr. Christy Gentles    Chief Complaint:  Code stroke  History obtained from:  Patient     HPI:                                                                                                                                         Hayley [name] is an 70 y.o. female with chronic UE pain and numbness, history of hypertension, diabetes, DVT, PE following surgical procedure, most recent was right knee replacement in March 2016 and was on Pradaxa but due to cost has stopped this. She suffers from chronic LBP and has been evaluated in the past. She is set up for a EMG with her primary neurologist next wednesday with Dr. [name]. Today she was putting on her make up when she felt the "inside of her head was not moving right". She drove to her primary MD and upon exiting the car she felt dizzy and off balance. She was sent from there to [facility] as a Code stroke. On arrival she had multiple functional aspects but no significant disability. CT head showed no acute infarct or bleed. tPA was not given. She does have a HA which will be addressed by ED MD.   Date last known well: Today Time last known well: Time: 09:30 tPA Given: No: minimal symptoms--out of window   Past Medical History  Diagnosis Date  . Hyperthyroidism   . Toxic goiter   . Reflux   . Colon polyps   . Migraines   . 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 Obesity, Class III, BMI 40-49.9 (morbid obesity) ([place]) 04/27/2010    02-17-14 reports some weight loss- intentional  . Pelvic pain     02-17-14 "states thinks its back related on left groin"  . Anemia   . Clotting disorder ([place]) 02-17-14    S/p appendectomy-developed Pulmonary emboli-tx. warfarin-d/c 1 yr ago.  . Osteoarthritis     in back(chronic pain)  . Pulmonary emboli [facility])     s/p Appendectomy '13 [facility]  . Diabetes mellitus without complication ([place])     "borderline"- oral med  . Right knee pain   . Sleep apnea     cpap  - settings at 4   . Bronchitis     hx of   . Anxiety   . Depression   . Kidney stones   . Neuromuscular disorder ([place])     hands and throat"spasmodic dysphonia", tremors - thimb and forefinger   . Nausea Nov 15, 2014    cycle of migraine headaches and nausea    Past Surgical History  Procedure Laterality Date  . Abdominal hysterectomy    . [place] diverticulum    . Kidney stone surgery    . Laparoscopic appendectomy  11/27/2011    Procedure: APPENDECTOMY LAPAROSCOPIC;  Surgeon: Adin Hector, MD;  Location: [facility]S;  Service: General;  Laterality: N/A;  . Cholecystectomy    . Knee arthroscopy  1998    right  . Hand surgery  2004    both hands  . Botox injection      for migranes  . Cataract extraction, bilateral Bilateral 03/2013   . Vocal cord surgery  02-17-14    01-12-14 done in [place] [facility](s/p selective denervation/reinnervation recurrent laryngeal nerve surgery)  . Appendectomy      3'13 [facility] s/p developed  Pulmonary Emboli  . Total knee revision Left 02/24/2014    Procedure: LEFT TOTAL KNEE REVISION;  Surgeon: Gearlean Alf, MD;  Location: [facility];  Service: Orthopedics;  Laterality: Left;  . Total knee arthroplasty Right 12/06/2014    Procedure: RIGHT TOTAL KNEE ARTHROPLASTY;  Surgeon: Gaynelle Arabian, MD;  Location: [facility];  Service: Orthopedics;  Laterality: Right;    Family History  Problem Relation Age of Onset  . Heart disease Mother   . Pneumonia Father   . Cirrhosis Father   . Hypertension Father   . Heart attack Paternal Grandfather   . Heart attack Maternal Grandfather   . Heart attack Paternal Grandmother   . Heart attack Maternal Grandmother   . Alcoholism Father    Social History:  reports that she quit smoking about 34 years ago. She has never used smokeless tobacco. She reports that she does not drink alcohol or use illicit drugs.  Allergies:  Allergies  Allergen Reactions  . Tape Rash    Rash with paper tape  . Bee Venom     unknown  .  Etodolac Nausea And Vomiting  . Morphine Other (See Comments)    Hallucinations  . Other Itching and Swelling  . Propoxyphene   . Propoxyphene N-Acetaminophen Nausea And Vomiting  . Poison Ivy Extract  [Extract Of Poison Ivy] Itching and Swelling  . Penicillins Rash         Medications:                                                                                                                           Current Facility-Administered Medications  Medication Dose Route Frequency Provider Last Rate Last Dose  . diphenhydrAMINE (BENADRYL) injection 25 mg  25 mg Intravenous Once Ripley Fraise, MD      . metoCLOPramide (REGLAN) injection 10 mg  10 mg Intravenous Once Ripley Fraise, MD       Current Outpatient Prescriptions  Medication Sig Dispense Refill  . albuterol (PROVENTIL HFA;VENTOLIN HFA) 108 (90 BASE) MCG/ACT inhaler Inhale 1 puff into the lungs every 6 (six) hours as needed for wheezing or shortness of breath.    [name] albuterol (PROVENTIL) (2.5 MG/3ML) 0.083% nebulizer solution Take 2.5 mg by nebulization every 6 (six) hours as needed for wheezing or shortness of breath. Reported on 01/10/2016    . benzonatate (TESSALON) 100 MG capsule Takes as needed  1  . buPROPion (WELLBUTRIN XL) 150 MG 24  hr tablet Take 150 mg by mouth every morning.  0  . clonazePAM (KLONOPIN) 0.5 MG tablet Take 1 tablet (0.5 mg total) by mouth at bedtime. 30 tablet 0  . cyclobenzaprine (FLEXERIL) 10 MG tablet Take 1 tablet (10 mg total) by mouth at bedtime. 90 tablet 4  . dabigatran (PRADAXA) 75 MG CAPS capsule Take 1 capsule (75 mg total) by mouth 2 (two) times daily. 180 capsule 3  . DULoxetine (CYMBALTA) 60 MG capsule Take 1 capsule (60 mg total) by mouth every morning. 90 capsule 4  . fluticasone (FLONASE) 50 MCG/ACT nasal spray Place 1 spray into both nostrils daily as needed for allergies or rhinitis.    [name] gabapentin (NEURONTIN) 800 MG tablet 800mg  in the morning and 1600mg  at pm 90 tablet 6  .  levothyroxine (SYNTHROID, LEVOTHROID) 25 MCG tablet Take 25 mcg by mouth every morning.     . mometasone-formoterol (DULERA) 100-5 MCG/ACT AERO Inhale 2 puffs into the lungs 2 (two) times daily.    . ondansetron (ZOFRAN) 4 MG tablet Take 4 mg by mouth every 8 (eight) hours as needed for nausea or vomiting.    . Probiotic Product (ACIDOPHILUS/GOAT MILK) CAPS Take by mouth daily.    . propranolol (INDERAL) 60 MG tablet Take 1 tablet (60 mg total) by mouth 2 (two) times daily. 180 tablet 3  . rizatriptan (MAXALT) 10 MG tablet Take 10 mg by mouth as needed for migraine (migraine). Reported on 01/10/2016    . sitaGLIPtin (JANUVIA) 100 MG tablet Take 100 mg by mouth every evening. Reported on 01/10/2016    . SUMAtriptan Succinate Refill 4 MG/0.5ML SOCT Inject 4 mg into the skin as needed. (Patient taking differently: Inject 4 mg into the skin as needed (migraines). ) 5 mL 6  . valACYclovir (VALTREX) 1000 MG tablet Take 2,000 mg by mouth 2 (two) times daily as needed (Fever blisters). Reported on 01/10/2016  2     ROS:                                                                                                                                       History obtained from the patient  General ROS: negative for - chills, fatigue, fever, night sweats, weight gain or weight loss Psychological ROS: negative for - behavioral disorder, hallucinations, memory difficulties, mood swings or suicidal ideation Ophthalmic ROS: negative for - blurry vision, double vision, eye pain or loss of vision ENT ROS: positive for - dizziness Allergy and Immunology ROS: negative for - hives or itchy/watery eyes Hematological and Lymphatic ROS: negative for - bleeding problems, bruising or swollen lymph nodes Endocrine ROS: negative for - galactorrhea, hair pattern changes, polydipsia/polyuria or temperature intolerance Respiratory ROS: negative for - cough, hemoptysis, shortness of breath or wheezing Cardiovascular ROS:  negative for - chest pain, dyspnea on exertion, edema or irregular heartbeat Gastrointestinal ROS: negative for - abdominal pain, diarrhea, hematemesis,  nausea/vomiting or stool incontinence Genito-Urinary ROS: negative for - dysuria, hematuria, incontinence or urinary frequency/urgency Musculoskeletal ROS: negative for - joint swelling or muscular weakness Neurological ROS: as noted in HPI Dermatological ROS: negative for rash and skin lesion changes  Neurologic Examination:                                                                                                      Blood pressure 105/74, pulse 66, resp. rate 18, weight 108.6 kg (239 lb 6.7 oz), SpO2 95 %.  HEENT-  Normocephalic, no lesions, without obvious abnormality.  Normal external eye and conjunctiva.  Normal TM's bilaterally.  Normal auditory canals and external ears. Normal external nose, mucus membranes and septum.  Normal pharynx. Cardiovascular- S1, S2 normal, pulses palpable throughout   Lungs- chest clear, no wheezing, rales, normal symmetric air entry Abdomen- normal findings: bowel sounds normal Extremities- no edema Lymph-no adenopathy palpable Musculoskeletal-no joint tenderness, deformity or swelling Skin-warm and dry, no hyperpigmentation, vitiligo, or suspicious lesions  Neurological Examination Mental Status: Alert, oriented, thought content appropriate.  Speech fluent without evidence of aphasia.  Able to follow 3 step commands without difficulty. Cranial Nerves: II: ; Visual fields grossly normal, pupils equal, round, reactive to light and accommodation III,IV, VI: ptosis not present, extra-ocular motions intact bilaterally V,VII: smile symmetric, facial light touch sensation decreased on the right and splits midline with tuning fork VIII: hearing normal bilaterally IX,X: uvula rises symmetrically XI: bilateral shoulder shrug--but has poor effort turning head to the right with strong SCM to the left XII:  midline tongue extension Motor: Right : Upper extremity   5/5    Left:     Upper extremity   5/5  Lower extremity   5/5     Lower extremity   5/5 --positive hoover's with weakness on the right leg Tone and bulk:normal tone throughout; no atrophy noted Sensory: Pinprick and light touch stated to be decreased on the right splitting midline at the trunk Deep Tendon Reflexes: 2+ and symmetric throughout UE and right knee. No left KJ and no AJ Plantars: Mute bilaterally Cerebellar: normal finger-to-nose,and normal heel-to-shin test with poor effort using right leg just letting it slide down shin.  Gait: not tested       Lab Results: Basic Metabolic Panel:  Recent Labs Lab 03/22/16 1303  NA 139  K 4.0  CL 101  GLUCOSE 98  BUN 5*  CREATININE 0.70    Liver Function Tests: No results for input(s): AST, ALT, ALKPHOS, BILITOT, PROT, ALBUMIN in the last 168 hours. No results for input(s): LIPASE, AMYLASE in the last 168 hours. No results for input(s): AMMONIA in the last 168 hours.  CBC:  Recent Labs Lab 03/22/16 1258 03/22/16 1303  WBC 8.0  --   NEUTROABS 5.6  --   HGB 12.8 13.6  HCT 41.1 40.0  MCV 87.1  --   PLT 230  --     Cardiac Enzymes: No results for input(s): CKTOTAL, CKMB, CKMBINDEX, TROPONINI in the last 168 hours.  Lipid Panel: No results for input(s): CHOL, TRIG,  HDL, CHOLHDL, VLDL, LDLCALC in the last 168 hours.  CBG:  Recent Labs Lab 03/22/16 1257  GLUCAP 99    Microbiology: Results for orders placed or performed during the hospital encounter of 12/06/14  Clostridium Difficile by PCR     Status: None   Collection Time: 12/08/14  2:31 PM  Result Value Ref Range Status   Toxigenic C Difficile by pcr NEGATIVE NEGATIVE Final    Coagulation Studies:  Recent Labs  03/22/16 1258  LABPROT 13.7  INR 1.03    Imaging: Ct Head Code Stroke W/o Cm  03/22/2016  CLINICAL DATA:  Code stroke, right-sided weakness EXAM: CT HEAD WITHOUT CONTRAST  TECHNIQUE: Contiguous axial images were obtained from the base of the skull through the vertex without intravenous contrast. COMPARISON:  08/16/2015 FINDINGS: No skull fracture is noted. Paranasal sinuses and mastoid air cells are unremarkable. Mild cerebral atrophy is noted. No intracranial hemorrhage, mass effect or midline shift. Mild periventricular white matter decreased attenuation probable due to chronic small vessel ischemic changes. No definite acute cortical infarction. No mass lesion is noted on this unenhanced scan. Atherosclerotic calcifications of carotid siphon. IMPRESSION: 1. No acute intracranial abnormality. Mild cerebral atrophy. Mild periventricular white matter decreased attenuation probable due to chronic small vessel ischemic changes. No definite acute cortical infarction. Electronically Signed   By: Lahoma Crocker M.D.   On: 03/22/2016 13:05       Assessment and plan discussed with with attending physician and they are in agreement.    Etta Quill PA-C [facility] [phone]  03/22/2016, 1:27 PM   Assessment: 70 y.o. female with symptoms of dizziness, right sided decreased sensation and LE weakness. Exam shows multiple functional qualities as above. She is not a tPA candidate and does not show exam concerning for large vessel occlusion.   Stroke Risk Factors - hypercoagulable state   Neurology Attending Addendum:  This patient was seen, examined, and d/w PA. I have reviewed the note and agree with the findings, assessment and plan as documented with the following additions.   Patient called as Code Stroke due to reports of right face and arm numbness. She reports she was putting on her makeup this morning when she felt numbness in the right arm and face. She felt as if the right side of her face was drooping. She drove herself to her PCP's office where she states that she had slurred speech when she tried to talk with staff and was sent to the ED due to concern  for stroke. She is also complaining of a severe headache. Indeed, at this time, she is not endorsing any significant numbness and her main concern is the headache.   On exam, she has intact mental status. Speech is clear. No aphasia. CN are notable for loss of all sensory modalities on the right side (including vibration) with midline splitting. She has inconsistent weakness in the right arm. She has a positive Hoover's on the right. Tone and DTRs are normal. No dysmetria.   I have personally and independently reviewed her [place] from today. This shows no acute pathology. She has mild atrophy with a mild burden of chronic small vessel ischemic disease in the bihemispheric white matter.   A/P:  1. Right hemiparesis: This is nonphysiiologic in nature and is not consistent with an acute stroke. She is therefore not a candidate for tPA. Recommend reassurance and consider PT/OT if symptoms persist.  2. Right hemisensory loss: As above. PT/OT if persists.   No additional recs,  neuro will sign off.

## 2016-03-22 NOTE — Progress Notes (Signed)
   03/22/16 2335  BiPAP/CPAP/SIPAP  BiPAP/CPAP/SIPAP Pt Type Adult  Mask Type Full face mask  Mask Size Medium  Set Rate 0 breaths/min  Respiratory Rate 16 breaths/min  IPAP 10 cmH20  EPAP 10 cmH2O  Oxygen Percent 21 %  Flow Rate 0 lpm  BiPAP/CPAP/SIPAP CPAP  Patient Home Equipment No  Auto Titrate No  BiPAP/CPAP /SiPAP Vitals  Pulse Rate 70  Resp 16  Patient placed on CPAP on above settings. She tolerates it very well at this time.

## 2016-03-22 NOTE — ED Notes (Signed)
Went to ambulate pt, pt stated as soon as she stood up she felt dizzy and she started to fall back, informed Hayley-RN.

## 2016-03-22 NOTE — H&P (Signed)
History and Physical    Hayley [name] [id number] DOB: 27-Aug-1946 DOA: 03/22/2016   PCP: [name], FNP   Patient coming from/Resides with: Private residence/lives with spouse  Chief Complaint: Dizziness, vertigo and subjective right facial drooping  HPI: Hayley [name] is a 70 y.o. female with medical history significant for chronic paresthesia of both lower extremities resulting in gait instability and frequent falls, spasmodic dysphonia as well as left cervical radiculopathy although by Dr. [name] with neurology. Patient also has a history of essential tremor, chronic elevated d-dimer after being diagnosed with PE/DVT in 2016 post laparoscopic cholecystectomy. She has diabetes and she is on Januvia samples. She has chronic anemia and hypothyroidism. Patient reports that she was in her usual state of health until she awakened this morning and noticed she was dizzy. She described this as a sensation that she was spinning. Because of the sensation she has been unable to ambulate. The only new medication change she has undergone was last week when her neurologist increased her propranolol dose. She states that while putting her makeup on this morning she felt the right side of her face was drooping and she reports difficulty with word finding. At baseline she has thick speech. In discussing her diabetes with her patient tells me she is borderline although her medication reconciliation again demonstrates recent dispense of Januvia samples. Patient was initially evaluated as a code stroke. She was evaluated by the neurology team who documented symptoms of dizziness right side decreased sensation and lower extremity weakness exam demonstrating multiple chronic functional issues as documented in her outpatient neurology evaluations. She was deemed a TPA candidate and her exam did not show evidence concerning for large vessel occlusion.  ED Course:  Vital signs: PO temp 97.5-BP 09/29/1962-pulse  68-respirations 17-room air saturations 92% Orthostatic vital signs: Supine BP 97/63 with a pulse of 60, sitting BP 92/71 with pulse of 67, standing BP 104/72 with a pulse of 67 CT of head without contrast: No acute intracranial abnormality; mild cerebral atrophy; mild periventricular white matter decreased attenuation due to chronic small vessel ischemic changes. No definite acute cortical infarction Lab data: Sodium 139, potassium 4.0, BUN 5, creatinine 0.7, glucose 98, calcium 1.9, POC troponin 0.00, white count 8000 with normal differential, hemoglobin 12.8, platelets 230,000; urinalysis significantly abnormal and concerning for UTI with many bacteria, trace hemoglobin, large leukocytes, positive nitrite, too numerous to count WBCs-urine culture was not obtained in the ER but has been ordered at time of admission Medications and treatments: Reglan 10 mg IV 1, Benadryl 25 mg IV 1, Cipro 400 mg IV 1  Review of Systems:  In addition to the HPI above,  No Fever-chills, myalgias or other constitutional symptoms No Headache, changes with Vision or hearing, ? new weakness versus exacerbation of known underlying lower extremity weakness, no definitive tingling, numbness in any extremity different from baseline abnormalities previously documented No problems swallowing food or Liquids, indigestion/reflux No Chest pain, Cough or Shortness of Breath, palpitations, orthopnea or DOE No Abdominal pain, N/V; no melena or hematochezia, no dark tarry stools, Bowel movements are regular, No dysuria, hematuria or flank pain No new skin rashes, lesions, masses or bruises, No new joints pains-aches No recent weight gain or loss No polyuria, polydypsia or polyphagia,   Past Medical History  Diagnosis Date  . Hyperthyroidism   . Toxic goiter   . Reflux   . Colon polyps   . Migraines   . Disorder of vocal cords  spasmotic dysphonia ,02-17-14 has" whispery voice-low tone"  . Obesity, Class III, BMI  40-49.9 (morbid obesity) ([place]) 04/27/2010    02-17-14 reports some weight loss- intentional  . Pelvic pain     02-17-14 "states thinks its back related on left groin"  . Anemia   . Clotting disorder ([place]) 02-17-14    S/p appendectomy-developed Pulmonary emboli-tx. warfarin-d/c 1 yr ago.  . Osteoarthritis     in back(chronic pain)  . Pulmonary emboli [facility])     s/p Appendectomy '13 [facility]  . Diabetes mellitus without complication ([place])     "borderline"- oral med  . Right knee pain   . Sleep apnea     cpap - settings at 4   . Bronchitis     hx of   . Anxiety   . Depression   . Kidney stones   . Neuromuscular disorder ([place])     hands and throat"spasmodic dysphonia", tremors - thimb and forefinger   . Nausea Nov 15, 2014    cycle of migraine headaches and nausea    Past Surgical History  Procedure Laterality Date  . Abdominal hysterectomy    . [place] diverticulum    . Kidney stone surgery    . Laparoscopic appendectomy  11/27/2011    Procedure: APPENDECTOMY LAPAROSCOPIC;  Surgeon: Adin Hector, MD;  Location: [facility]S;  Service: General;  Laterality: N/A;  . Cholecystectomy    . Knee arthroscopy  1998    right  . Hand surgery  2004    both hands  . Botox injection      for migranes  . Cataract extraction, bilateral Bilateral 03/2013   . Vocal cord surgery  02-17-14    01-12-14 done in [place] [facility](s/p selective denervation/reinnervation recurrent laryngeal nerve surgery)  . Appendectomy      3'13 [facility] s/p developed  Pulmonary Emboli  . Total knee revision Left 02/24/2014    Procedure: LEFT TOTAL KNEE REVISION;  Surgeon: Gearlean Alf, MD;  Location: [facility];  Service: Orthopedics;  Laterality: Left;  . Total knee arthroplasty Right 12/06/2014    Procedure: RIGHT TOTAL KNEE ARTHROPLASTY;  Surgeon: Gaynelle Arabian, MD;  Location: [facility];  Service: Orthopedics;  Laterality: Right;    Social History   Social History  . Marital Status: Married    Spouse Name: N/A  . Number of  Children: 5  . Years of Education: 16   Occupational History  . Retired    Social History Main Topics  . Smoking status: Former Smoker    Quit date: 01/03/1982  . Smokeless tobacco: Never Used  . Alcohol Use: No  . Drug Use: No  . Sexual Activity: Not on file   Other Topics Concern  . Not on file   Social History Narrative   Lives at home with husband.   Right-handed.   2 cups caffeine per day.    Mobility: Without assistive devices Work history: Not obtained   Allergies  Allergen Reactions  . Tape Rash    Rash with paper tape  . Bee Venom     unknown  . Etodolac Nausea And Vomiting  . Morphine Other (See Comments)    Hallucinations  . Other Itching and Swelling  . Propoxyphene   . Propoxyphene N-Acetaminophen Nausea And Vomiting  . Poison Ivy Extract  [Extract Of Poison Ivy] Itching and Swelling  . Penicillins Rash         Family History  Problem Relation Age of Onset  . Heart disease  Mother   . Pneumonia Father   . Cirrhosis Father   . Hypertension Father   . Heart attack Paternal Grandfather   . Heart attack Maternal Grandfather   . Heart attack Paternal Grandmother   . Heart attack Maternal Grandmother   . Alcoholism Father      Prior to Admission medications   Medication Sig Start Date End Date Taking? Authorizing Provider  albuterol (PROVENTIL HFA;VENTOLIN HFA) 108 (90 BASE) MCG/ACT inhaler Inhale 1 puff into the lungs every 6 (six) hours as needed for wheezing or shortness of breath.   Yes Historical Provider, MD  albuterol (PROVENTIL) (2.5 MG/3ML) 0.083% nebulizer solution Take 2.5 mg by nebulization every 6 (six) hours as needed for wheezing or shortness of breath. Reported on 01/10/2016   Yes Historical Provider, MD  benzonatate (TESSALON) 100 MG capsule Takes as needed 06/22/15  Yes Historical Provider, MD  buPROPion (WELLBUTRIN XL) 150 MG 24 hr tablet Take 150 mg by mouth every morning. 02/27/16  Yes Historical Provider, MD  clonazePAM  (KLONOPIN) 0.5 MG tablet Take 1 tablet (0.5 mg total) by mouth at bedtime. 06/07/15  Yes Reyne Dumas, MD  cyclobenzaprine (FLEXERIL) 10 MG tablet Take 1 tablet (10 mg total) by mouth at bedtime. 03/12/16  Yes Marcial Pacas, MD  DULoxetine (CYMBALTA) 60 MG capsule Take 1 capsule (60 mg total) by mouth every morning. 03/12/16  Yes Marcial Pacas, MD  fluticasone (FLONASE) 50 MCG/ACT nasal spray Place 1 spray into both nostrils daily as needed for allergies or rhinitis.   Yes Historical Provider, MD  gabapentin (NEURONTIN) 800 MG tablet 800mg  in the morning and 1600mg  at pm 07/11/15  Yes Marcial Pacas, MD  levothyroxine (SYNTHROID, LEVOTHROID) 25 MCG tablet Take 25 mcg by mouth every morning.    Yes Historical Provider, MD  mometasone-formoterol (DULERA) 100-5 MCG/ACT AERO Inhale 2 puffs into the lungs 2 (two) times daily.   Yes Historical Provider, MD  ondansetron (ZOFRAN) 4 MG tablet Take 4 mg by mouth every 8 (eight) hours as needed for nausea or vomiting.   Yes Historical Provider, MD  Probiotic Product (ACIDOPHILUS/GOAT MILK) CAPS Take by mouth daily.   Yes Historical Provider, MD  propranolol (INDERAL) 60 MG tablet Take 1 tablet (60 mg total) by mouth 2 (two) times daily. 03/19/16  Yes Marcial Pacas, MD  rizatriptan (MAXALT) 10 MG tablet Take 10 mg by mouth as needed for migraine (migraine). Reported on 01/10/2016   Yes Historical Provider, MD  sitaGLIPtin (JANUVIA) 100 MG tablet Take 100 mg by mouth every evening. Reported on 01/10/2016   Yes Historical Provider, MD  valACYclovir (VALTREX) 1000 MG tablet Take 2,000 mg by mouth 2 (two) times daily as needed (Fever blisters). Reported on 01/10/2016 10/14/14  Yes Historical Provider, MD  dabigatran (PRADAXA) 75 MG CAPS capsule Take 1 capsule (75 mg total) by mouth 2 (two) times daily. Patient not taking: Reported on 03/22/2016 01/10/16   Laurie Panda, NP  SUMAtriptan Succinate Refill 4 MG/0.5ML SOCT Inject 4 mg into the skin as needed. Patient taking differently: Inject 4  mg into the skin as needed (migraines).  04/26/15   Marcial Pacas, MD    Physical Exam: Filed Vitals:   03/22/16 1553 03/22/16 1600 03/22/16 1606 03/22/16 1629  BP:  113/64 101/62   Pulse:  68 61   Temp: 97.5 F (36.4 C)   97.5 F (36.4 C)  TempSrc: Oral     Resp:  17 17   Weight:      SpO2:  92% 94%       Constitutional: NAD, calm, comfortable Eyes: PERRL, lids and conjunctivae normal ENMT: Mucous membranes are moist. Posterior pharynx clear of any exudate or lesions.Normal dentition.  Neck: normal, supple, no masses, no thyromegaly Respiratory: clear to auscultation bilaterally, no wheezing, no crackles. Normal respiratory effort. No accessory muscle use.  Cardiovascular: Regular rate and rhythmWithout bradycardia, no murmurs / rubs / gallops. No extremity edema. 2+ pedal pulses. No carotid bruits.  Abdomen: no tenderness, no masses palpated. No hepatosplenomegaly. Bowel sounds positive.  Musculoskeletal: no clubbing / cyanosis. No joint deformity upper and lower extremities. Good ROM, no contractures. Normal muscle tone.  Skin: no rashes, lesions, ulcers. No induration Neurologic: CN 2-12 grossly intact although I did elicit slight horizontal nystagmus associated nausea with testing-no vertical nystagmus. She has chronic thick speech i.e. slurred speech which according to friend at bedside is at her baseline, Sensation intact, DTR normal. Strength upper extremities equal but slightly weak at 4/5; lower extremity strength 4-5/5 . No pronator drift. Psychiatric: Normal judgment and insight. Alert and oriented x 3. Normal mood.    Labs on Admission: I have personally reviewed following labs and imaging studies  CBC:  Recent Labs Lab 03/22/16 1258 03/22/16 1303  WBC 8.0  --   NEUTROABS 5.6  --   HGB 12.8 13.6  HCT 41.1 40.0  MCV 87.1  --   PLT 230  --    Basic Metabolic Panel:  Recent Labs Lab 03/22/16 1258 03/22/16 1303  NA 138 139  K 4.1 4.0  CL 104 101  CO2 27  --    GLUCOSE 103* 98  BUN 5* 5*  CREATININE 0.80 0.70  CALCIUM 8.8*  --    GFR: Estimated Creatinine Clearance: 81.2 mL/min (by C-G formula based on Cr of 0.7). Liver Function Tests:  Recent Labs Lab 03/22/16 1258  AST 18  ALT 16  ALKPHOS 77  BILITOT 0.4  PROT 6.5  ALBUMIN 3.3*   No results for input(s): LIPASE, AMYLASE in the last 168 hours. No results for input(s): AMMONIA in the last 168 hours. Coagulation Profile:  Recent Labs Lab 03/22/16 1258  INR 1.03   Cardiac Enzymes: No results for input(s): CKTOTAL, CKMB, CKMBINDEX, TROPONINI in the last 168 hours. BNP (last 3 results) No results for input(s): PROBNP in the last 8760 hours. HbA1C: No results for input(s): HGBA1C in the last 72 hours. CBG:  Recent Labs Lab 03/22/16 1257  GLUCAP 99   Lipid Profile: No results for input(s): CHOL, HDL, LDLCALC, TRIG, CHOLHDL, LDLDIRECT in the last 72 hours. Thyroid Function Tests: No results for input(s): TSH, T4TOTAL, FREET4, T3FREE, THYROIDAB in the last 72 hours. Anemia Panel: No results for input(s): VITAMINB12, FOLATE, FERRITIN, TIBC, IRON, RETICCTPCT in the last 72 hours. Urine analysis:    Component Value Date/Time   COLORURINE YELLOW 03/22/2016 1530   COLORURINE ORANGE 12/19/2014 0730   APPEARANCEUR CLOUDY* 03/22/2016 1530   APPEARANCEUR CLOUDY 12/19/2014 0730   LABSPEC 1.007 03/22/2016 1530   LABSPEC 1.025 12/19/2014 0730   PHURINE 6.5 03/22/2016 1530   PHURINE see comment 12/19/2014 0730   GLUCOSEU NEGATIVE 03/22/2016 1530   GLUCOSEU see comment 12/19/2014 0730   HGBUR TRACE* 03/22/2016 1530   HGBUR see comment 12/19/2014 0730   BILIRUBINUR NEGATIVE 03/22/2016 1530   BILIRUBINUR see comment 12/19/2014 0730   KETONESUR NEGATIVE 03/22/2016 1530   KETONESUR see comment 12/19/2014 0730   PROTEINUR NEGATIVE 03/22/2016 1530   PROTEINUR see comment 12/19/2014 0730  UROBILINOGEN 1.0 11/30/2014 1215   NITRITE POSITIVE* 03/22/2016 1530   NITRITE SEE COMMENT  12/19/2014 0730   LEUKOCYTESUR LARGE* 03/22/2016 1530   LEUKOCYTESUR see comment 12/19/2014 0730   Sepsis Labs: @LABRCNTIP (procalcitonin:4,lacticidven:4) )No results found for this or any previous visit (from the past 240 hour(s)).   Radiological Exams on Admission: Ct Head Code Stroke W/o Cm  03/22/2016  CLINICAL DATA:  Code stroke, right-sided weakness EXAM: CT HEAD WITHOUT CONTRAST TECHNIQUE: Contiguous axial images were obtained from the base of the skull through the vertex without intravenous contrast. COMPARISON:  08/16/2015 FINDINGS: No skull fracture is noted. Paranasal sinuses and mastoid air cells are unremarkable. Mild cerebral atrophy is noted. No intracranial hemorrhage, mass effect or midline shift. Mild periventricular white matter decreased attenuation probable due to chronic small vessel ischemic changes. No definite acute cortical infarction. No mass lesion is noted on this unenhanced scan. Atherosclerotic calcifications of carotid siphon. IMPRESSION: 1. No acute intracranial abnormality. Mild cerebral atrophy. Mild periventricular white matter decreased attenuation probable due to chronic small vessel ischemic changes. No definite acute cortical infarction. Electronically Signed   By: Lahoma Crocker M.D.   On: 03/22/2016 13:05    EKG: (Independently reviewed) Sinus rhythm with ventricular rate 66 bpm, QTC of 128 ms-normal EKG  Assessment/Plan Principal Problem:   Vertigo -Patient presents with dizziness and a subjective sensation of personal spinning positive nystagmus seen on exam concerning for vertigo -Dizziness and vertigo symptoms can also be seen and vertebrobasilar and cerebellar stroke so as precaution we'll treat as TIA/stroke evaluation -MRI/MRA head stat -Carotid duplex -Echocardiogram -Lipid panel and hemoglobin A1c risk factor stratification -PT/OT/SLP evaluation -PT vestibular exam -Antivert prn and Valium prn for vertigo symptoms -Full dose aspirin for  antiplatelet -Zofran prn  Active Problems:   Acute UTI -Highly abnormal urinalysis without significant leukocytosis or fever -Empiric Cipro -Follow-up on urine culture -UTI could be contributing to patient's dizziness and nausea symptoms    Spasmodic dysphonia/Paresthesia of both lower extremities -PT/OT evaluation of these chronic problems noting neurology has documented some falls recently -Continue preadmission Flexeril and Neurontin    History of pulmonary embolus (PE) -Has chronically elevated d-dimer d-dimer today was 0.55 -Stopped her Pradaxa one month ago due to high cost -Patient reports was informed would be eligible for medication assistance though I have ordered for case management to investigate -Full dose Lovenox for now -Patient reluctant to begin warfarin -Followed as an outpatient by Dr. [name]    OSA on CPAP -Nocturnal CPAP ordered    Hypothyroidism -Continue preadmission Synthroid    Diabetes mellitus without complication  -Patient reports has borderline diabetes but also documented has been given Januvia samples-therapeutic substitution with Tradjenta -Check hemoglobin A1c; hemoglobin A1c September 2016 was elevated at 7.0 -Follow CBGs -SSI    Essential hypertension -Blood pressure actually suboptimal but she is not orthostatic either -On beta blocker for tremor (see below)    Essential tremor -Currently controlled on recent increased dose of propranolol and no evidence of bradycardia -Continue monitoring on telemetry -PT/OT evaluation as above    Obesity, Class III, BMI 40-49.9 (morbid obesity)  -Weight reduction strategies per PCP    ANEMIA -Hemoglobin stable around baseline of between 12.8 and 13.6    ANXIETY DEPRESSION -Continue preadmission Clonopin, Wellbutrin and Cymbalta    Asthma -Continue preadmission albuterol MDI, prn Proventil nebs and Dulera      DVT prophylaxis: Full dose Lovenox  Code Status: DO NOT RESUSCITATE Family  Communication: Has been not available at bedside  with patient's permission family friend who is also a physician was updated at the bedside  Disposition Plan: Anticipate discharge back to preadmission environment Consults called: Neurology/[place]  Admission status:  Observation/telemetry    ELLIS,ALLISON L. ANP-BC [facility] Pager [phone]   If 7PM-7AM, please contact night-coverage [email or web address] Password [facility]  03/22/2016, 4:43 PM

## 2016-03-22 NOTE — ED Provider Notes (Signed)
CSN: [id number]     Arrival date & time 03/22/16  1250 History   First MD Initiated Contact with Patient 03/22/16 1252     Chief Complaint  Patient presents with  . Code Stroke    An emergency department physician performed an initial assessment on this suspected stroke patient at 1250. LEVEL 5 CAVEAT DUE TO ACUITY OF CONDITION  Patient is a 70 y.o. female presenting with weakness. The history is provided by the patient and the EMS personnel.  Weakness The current episode started 3 to 5 hours ago. The problem occurs constantly. The problem has been gradually worsening. Associated symptoms include headaches. Nothing aggravates the symptoms. Nothing relieves the symptoms.   PATIENT PRESENTS FROM PCP OFFICE APPARENTLY STARTING AT 9AM, SHE HAD ONSET OF RIGHT FACIAL NUMBNESS, WEAKNESS, DIFFICULTY WALKING AND APHASIA NO OTHER DETAILS ARE KNOWN  Past Medical History  Diagnosis Date  . Hyperthyroidism   . Toxic goiter   . Reflux   . Colon polyps   . Migraines   . 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 Obesity, Class III, BMI 40-49.9 (morbid obesity) ([place]) 04/27/2010    02-17-14 reports some weight loss- intentional  . Pelvic pain     02-17-14 "states thinks its back related on left groin"  . Anemia   . Clotting disorder ([place]) 02-17-14    S/p appendectomy-developed Pulmonary emboli-tx. warfarin-d/c 1 yr ago.  . Osteoarthritis     in back(chronic pain)  . Pulmonary emboli [facility])     s/p Appendectomy '13 [facility]  . Diabetes mellitus without complication ([place])     "borderline"- oral med  . Right knee pain   . Sleep apnea     cpap - settings at 4   . Bronchitis     hx of   . Anxiety   . Depression   . Kidney stones   . Neuromuscular disorder ([place])     hands and throat"spasmodic dysphonia", tremors - thimb and forefinger   . Nausea Nov 15, 2014    cycle of migraine headaches and nausea   Past Surgical History  Procedure Laterality Date   . Abdominal hysterectomy    . [place] diverticulum    . Kidney stone surgery    . Laparoscopic appendectomy  11/27/2011    Procedure: APPENDECTOMY LAPAROSCOPIC;  Surgeon: Adin Hector, MD;  Location: [facility];  Service: General;  Laterality: N/A;  . Cholecystectomy    . Knee arthroscopy  1998    right  . Hand surgery  2004    both hands  . Botox injection      for migranes  . Cataract extraction, bilateral Bilateral 03/2013   . Vocal cord surgery  02-17-14    01-12-14 done in [place] -[facility](s/p selective denervation/reinnervation recurrent laryngeal nerve surgery)  . Appendectomy      3'13 [facility] s/p developed  Pulmonary Emboli  . Total knee revision Left 02/24/2014    Procedure: LEFT TOTAL KNEE REVISION;  Surgeon: Gearlean Alf, MD;  Location: [facility];  Service: Orthopedics;  Laterality: Left;  . Total knee arthroplasty Right 12/06/2014    Procedure: RIGHT TOTAL KNEE ARTHROPLASTY;  Surgeon: Gaynelle Arabian, MD;  Location: [facility];  Service: Orthopedics;  Laterality: Right;   Family History  Problem Relation Age of Onset  . Heart disease Mother   . Pneumonia Father   . Cirrhosis Father   . Hypertension Father   . Heart attack Paternal Grandfather   .  Heart attack Maternal Grandfather   . Heart attack Paternal Grandmother   . Heart attack Maternal Grandmother   . Alcoholism Father    Social History  Substance Use Topics  . Smoking status: Former Smoker    Quit date: 01/03/1982  . Smokeless tobacco: Never Used  . Alcohol Use: No   OB History    No data available     Review of Systems  Unable to perform ROS: Acuity of condition  Neurological: Positive for weakness and headaches.      Allergies  Tape; Bee venom; Etodolac; Morphine; Other; Propoxyphene; Propoxyphene n-acetaminophen; Poison ivy extract ; and Penicillins  Home Medications   Prior to Admission medications   Medication Sig Start Date End Date Taking? Authorizing Provider  albuterol (PROVENTIL HFA;VENTOLIN  HFA) 108 (90 BASE) MCG/ACT inhaler Inhale 1 puff into the lungs every 6 (six) hours as needed for wheezing or shortness of breath.    Historical Provider, MD  albuterol (PROVENTIL) (2.5 MG/3ML) 0.083% nebulizer solution Take 2.5 mg by nebulization every 6 (six) hours as needed for wheezing or shortness of breath. Reported on 01/10/2016    Historical Provider, MD  benzonatate (TESSALON) 100 MG capsule Takes as needed 06/22/15   Historical Provider, MD  buPROPion (WELLBUTRIN XL) 150 MG 24 hr tablet Take 150 mg by mouth every morning. 02/27/16   Historical Provider, MD  clonazePAM (KLONOPIN) 0.5 MG tablet Take 1 tablet (0.5 mg total) by mouth at bedtime. 06/07/15   Reyne Dumas, MD  cyclobenzaprine (FLEXERIL) 10 MG tablet Take 1 tablet (10 mg total) by mouth at bedtime. 03/12/16   Marcial Pacas, MD  dabigatran (PRADAXA) 75 MG CAPS capsule Take 1 capsule (75 mg total) by mouth 2 (two) times daily. 01/10/16   Laurie Panda, NP  DULoxetine (CYMBALTA) 60 MG capsule Take 1 capsule (60 mg total) by mouth every morning. 03/12/16   Marcial Pacas, MD  fluticasone (FLONASE) 50 MCG/ACT nasal spray Place 1 spray into both nostrils daily as needed for allergies or rhinitis.    Historical Provider, MD  gabapentin (NEURONTIN) 800 MG tablet 800mg  in the morning and 1600mg  at pm 07/11/15   Marcial Pacas, MD  levothyroxine (SYNTHROID, LEVOTHROID) 25 MCG tablet Take 25 mcg by mouth every morning.     Historical Provider, MD  mometasone-formoterol (DULERA) 100-5 MCG/ACT AERO Inhale 2 puffs into the lungs 2 (two) times daily.    Historical Provider, MD  ondansetron (ZOFRAN) 4 MG tablet Take 4 mg by mouth every 8 (eight) hours as needed for nausea or vomiting.    Historical Provider, MD  Probiotic Product (ACIDOPHILUS/GOAT MILK) CAPS Take by mouth daily.    Historical Provider, MD  propranolol (INDERAL) 60 MG tablet Take 1 tablet (60 mg total) by mouth 2 (two) times daily. 03/19/16   Marcial Pacas, MD  rizatriptan (MAXALT) 10 MG tablet Take 10 mg  by mouth as needed for migraine (migraine). Reported on 01/10/2016    Historical Provider, MD  sitaGLIPtin (JANUVIA) 100 MG tablet Take 100 mg by mouth every evening. Reported on 01/10/2016    Historical Provider, MD  SUMAtriptan Succinate Refill 4 MG/0.5ML SOCT Inject 4 mg into the skin as needed. Patient taking differently: Inject 4 mg into the skin as needed (migraines).  04/26/15   Marcial Pacas, MD  valACYclovir (VALTREX) 1000 MG tablet Take 2,000 mg by mouth 2 (two) times daily as needed (Fever blisters). Reported on 01/10/2016 10/14/14   Historical Provider, MD   BP 115/84 mmHg  Pulse  61  Temp(Src) 97.5 F (36.4 C) (Oral)  Resp 17  Wt 108.6 kg  SpO2 95% Physical Exam CONSTITUTIONAL: Well developed/well nourished, anxious HEAD: Normocephalic/atraumatic EYES: EOMI/PERRL ENMT: Mucous membranes moist NECK: supple no meningeal signs SPINE/BACK:entire spine nontender CV: S1/S2 noted, no murmurs/rubs/gallops noted LUNGS: Lungs are clear to auscultation bilaterally, no apparent distress ABDOMEN: soft, nontender NEURO: Pt is awake/alert/appropriate, moves all extremitiesx4.  No facial droop.  No arm/leg drift EXTREMITIES: pulses normal/equal, full ROM SKIN: warm, color normal PSYCH: anxious  ED Course  Procedures  1:43 PM Seen by neuro dr Shon Hale He has cancelled code stroke He does not feel this represents CVA/TIA He requests to treat HA and d/c home 4:00 PM Pt with some improvement in HA It appears her stroke like symptoms improved However she reports dizziness/vertigo upon standing She also has UTI She is a fall risk Will admit D/w triad for admission  Labs Review Labs Reviewed  CBC - Abnormal; Notable for the following:    RDW 15.9 (*)    All other components within normal limits  COMPREHENSIVE METABOLIC PANEL - Abnormal; Notable for the following:    Glucose, Bld 103 (*)    BUN 5 (*)    Calcium 8.8 (*)    Albumin 3.3 (*)    All other components within normal limits   URINALYSIS, ROUTINE W REFLEX MICROSCOPIC (NOT AT [facility]) - Abnormal; Notable for the following:    APPearance CLOUDY (*)    Hgb urine dipstick TRACE (*)    Nitrite POSITIVE (*)    Leukocytes, UA LARGE (*)    All other components within normal limits  URINE MICROSCOPIC-ADD ON - Abnormal; Notable for the following:    Squamous Epithelial / LPF 0-5 (*)    Bacteria, UA MANY (*)    All other components within normal limits  I-STAT CHEM 8, ED - Abnormal; Notable for the following:    BUN 5 (*)    Calcium, Ion 1.09 (*)    All other components within normal limits  ETHANOL  PROTIME-INR  APTT  DIFFERENTIAL  I-STAT TROPOININ, ED  CBG MONITORING, ED    Imaging Review Ct Head Code Stroke W/o Cm  03/22/2016  CLINICAL DATA:  Code stroke, right-sided weakness EXAM: CT HEAD WITHOUT CONTRAST TECHNIQUE: Contiguous axial images were obtained from the base of the skull through the vertex without intravenous contrast. COMPARISON:  08/16/2015 FINDINGS: No skull fracture is noted. Paranasal sinuses and mastoid air cells are unremarkable. Mild cerebral atrophy is noted. No intracranial hemorrhage, mass effect or midline shift. Mild periventricular white matter decreased attenuation probable due to chronic small vessel ischemic changes. No definite acute cortical infarction. No mass lesion is noted on this unenhanced scan. Atherosclerotic calcifications of carotid siphon. IMPRESSION: 1. No acute intracranial abnormality. Mild cerebral atrophy. Mild periventricular white matter decreased attenuation probable due to chronic small vessel ischemic changes. No definite acute cortical infarction. Electronically Signed   By: Lahoma Crocker M.D.   On: 03/22/2016 13:05   I have personally reviewed and evaluated these lab results as part of my medical decision-making.   EKG Interpretation   Date/Time:  Thursday March 22 2016 13:14:55 EDT Ventricular Rate:  66 PR Interval:    QRS Duration: 89 QT Interval:  408 QTC  Calculation: 428 R Axis:   55 Text Interpretation:  Sinus rhythm Normal ECG Confirmed by Christy Gentles  MD,  Nakesha Ebrahim Memoli ([id number]) on 03/22/2016 1:18:50 PM     Medications  ciprofloxacin (CIPRO) IVPB 400 mg (not administered)  metoCLOPramide (REGLAN) injection 10 mg (10 mg Intravenous Given 03/22/16 1335)  diphenhydrAMINE (BENADRYL) injection 25 mg (25 mg Intravenous Given 03/22/16 1335)    MDM   Final diagnoses:  Vertigo  UTI (lower urinary tract infection)    Nursing notes including past medical history and social history reviewed and considered in documentation Labs/vital reviewed myself and considered during evaluation     Ripley Fraise, MD 03/22/16 [phone]

## 2016-03-22 NOTE — ED Notes (Signed)
Pt presents via [facility] from MD office as a code stroke.  Onset of symptoms at 9am this AM-pt c/o right facial numbness, generalized weakness, trouble walking, R sided HA, R arm numbness, and expressive aphasia.  EMS reports = grips and no drift noted.  MD office administered 324 ASA.  EKG NSR, BP-130/80 P-60 R-20 O2-88% RA, reports wearing O2 at night.  Neuro at bedside.  A x 4, NAD, airway intact.

## 2016-03-22 NOTE — ED Notes (Signed)
3M aware pt returned from MRI-transporting to room.

## 2016-03-23 ENCOUNTER — Observation Stay (HOSPITAL_BASED_OUTPATIENT_CLINIC_OR_DEPARTMENT_OTHER): Payer: Medicare Other

## 2016-03-23 ENCOUNTER — Telehealth: Payer: Self-pay | Admitting: Oncology

## 2016-03-23 ENCOUNTER — Other Ambulatory Visit: Payer: Self-pay | Admitting: Oncology

## 2016-03-23 DIAGNOSIS — R202 Paresthesia of skin: Secondary | ICD-10-CM

## 2016-03-23 DIAGNOSIS — Z86711 Personal history of pulmonary embolism: Secondary | ICD-10-CM | POA: Diagnosis not present

## 2016-03-23 DIAGNOSIS — I6789 Other cerebrovascular disease: Secondary | ICD-10-CM

## 2016-03-23 DIAGNOSIS — N390 Urinary tract infection, site not specified: Secondary | ICD-10-CM | POA: Diagnosis not present

## 2016-03-23 DIAGNOSIS — R42 Dizziness and giddiness: Secondary | ICD-10-CM

## 2016-03-23 LAB — GLUCOSE, CAPILLARY
Glucose-Capillary: 183 mg/dL — ABNORMAL HIGH (ref 65–99)
Glucose-Capillary: 127 mg/dL — ABNORMAL HIGH (ref 65–99)
Glucose-Capillary: 110 mg/dL — ABNORMAL HIGH (ref 65–99)

## 2016-03-23 LAB — VAS US CAROTID
LEFT ECA DIAS: -13 cm/s
LEFT VERTEBRAL DIAS: -26 cm/s
Left CCA dist dias: -38 cm/s
Left CCA dist sys: -125 cm/s
Left CCA prox dias: 29 cm/s
Left CCA prox sys: 109 cm/s
Left ICA dist dias: -27 cm/s
Left ICA dist sys: -72 cm/s
Left ICA prox dias: 18 cm/s
Left ICA prox sys: 87 cm/s
RIGHT ECA DIAS: -18 cm/s
RIGHT VERTEBRAL DIAS: -12 cm/s
Right CCA prox dias: -18 cm/s
Right CCA prox sys: -76 cm/s
Right cca dist sys: -96 cm/s

## 2016-03-23 LAB — LIPID PANEL
Cholesterol: 159 mg/dL (ref 0–200)
HDL: 31 mg/dL — ABNORMAL LOW (ref >40)
LDL Cholesterol: 93 mg/dL (ref 0–99)
Total CHOL/HDL Ratio: 5.1 RATIO
Triglycerides: 175 mg/dL — ABNORMAL HIGH (ref <150)
VLDL: 35 mg/dL (ref 0–40)

## 2016-03-23 LAB — ECHOCARDIOGRAM COMPLETE: Weight: 3824 oz

## 2016-03-23 LAB — CBC
HCT: 40.9 % (ref 36.0–46.0)
Hemoglobin: 12.8 g/dL (ref 12.0–15.0)
MCH: 27.2 pg (ref 26.0–34.0)
MCHC: 31.3 g/dL (ref 30.0–36.0)
MCV: 87 fL (ref 78.0–100.0)
Platelets: 182 10*3/uL (ref 150–400)
RBC: 4.7 MIL/uL (ref 3.87–5.11)
RDW: 16.1 % — ABNORMAL HIGH (ref 11.5–15.5)
WBC: 7.5 10*3/uL (ref 4.0–10.5)

## 2016-03-23 LAB — TSH: TSH: 1.868 u[IU]/mL (ref 0.350–4.500)

## 2016-03-23 LAB — VITAMIN B12: Vitamin B-12: 193 pg/mL (ref 180–914)

## 2016-03-23 MED ORDER — CIPROFLOXACIN HCL 500 MG PO TABS
500.0000 mg | ORAL_TABLET | Freq: Two times a day (BID) | ORAL | Status: DC
  Filled 2016-03-23: qty 1

## 2016-03-23 MED ORDER — CIPROFLOXACIN HCL 500 MG PO TABS: 500.0000 mg | ORAL_TABLET | Freq: Two times a day (BID) | ORAL | Status: DC

## 2016-03-23 MED ORDER — APIXABAN 5 MG PO TABS: 10.0000 mg | ORAL_TABLET | Freq: Two times a day (BID) | ORAL | Status: DC

## 2016-03-23 MED ORDER — PERFLUTREN LIPID MICROSPHERE
1.0000 mL | INTRAVENOUS | Status: AC | PRN
  Administered 2016-03-23: 2 mL via INTRAVENOUS
  Filled 2016-03-23: qty 10

## 2016-03-23 MED ORDER — HYDROCORTISONE 0.5 % EX CREA
TOPICAL_CREAM | Freq: Three times a day (TID) | CUTANEOUS | Status: DC | PRN
  Administered 2016-03-23: 08:00:00 via TOPICAL
  Filled 2016-03-23: qty 28.35

## 2016-03-23 MED ORDER — CYANOCOBALAMIN 500 MCG PO TABS: 500.0000 ug | ORAL_TABLET | Freq: Every day | ORAL | Status: DC

## 2016-03-23 MED ORDER — GABAPENTIN 400 MG PO CAPS
800.0000 mg | ORAL_CAPSULE | Freq: Every morning | ORAL | Status: DC
  Administered 2016-03-23: 800 mg via ORAL
  Filled 2016-03-23: qty 2

## 2016-03-23 NOTE — Progress Notes (Signed)
  Echocardiogram 2D Echocardiogram with Definity  has been performed.  Bobbye Charleston 03/23/2016, 10:58 AM

## 2016-03-23 NOTE — Discharge Summary (Signed)
Physician Discharge Summary  Hayley [name] [id number] DOB: 1946-01-27 DOA: 03/22/2016  PCP: [name], FNP  Admit date: 03/22/2016 Discharge date: 03/23/2016  Admitted From: Home Disposition:  Home Recommendations for Outpatient Follow-up:  1. Follow up with PCP in 1-2 weeks 2. Please obtain BMP/CBC in one week 3. Please follow up with Dr. [name] in 2-3 weeks  Home Health: yes Equipment/Devices: PT/OT/ST  Discharge Condition:Stable CODE STATUS:Full Diet recommendation: Heart Healthy / Carb Modified   Brief/Interim Summary: 70 year old female with a history of hypertension, diabetes mellitus, pulmonary emboli in 2013 and March 2016, essential tremor, , anxiety/depression presented with 1 day history of dizziness to the point over the inability to ambulate. The patient relates a recent increase in her propranolol dosing by her neurologist whom has been treating her for a cervical radiculopathy. In addition, the patient felt like she had a right facial droop with word finding difficulties. After admission, the patient was evaluated by neurology. They felt that the patient had a number of functional/nonphysiologic abnormalities. In addition, MRI of the brain was negative for acute infarct. MRA of the head was negative. BMP and CBC were unremarkable. The patient did have a urinalysis suggestive of UTI. The patient was started on ciprofloxacin. The patient states that she stopped her Pradaxa approximately 2 months prior to this admission without notifying any of her physicians. The case was discussed with the patient's hematologist, Dr. [name], who felt that the patient needed to be restarted back on anticoagulation. This was discussed with the patient.  Discharge Diagnoses:  Vertigo/right hemiparesis/sensory disturbance -The patient has some nonphysiologic abnormalities on physical examination -Positive Hoover's sign -MRI brain negative for acute stroke or other acute  abnormalities -Neurology recommended PT evaluation and reassurance -Follow up with outpatient neurology, Dr. [name] -carotid ultrasound--no hemodynamically significant stenosis -03/23/16-Echo--EF 60-65%, grade 1 daily, no WMA -serum B12 193-->193--start supplementation -TSH 1.868  Pyuria -To numerous to count WBC suggestive of UTI with associated dysuria -Continue empiric ciprofloxacin--home with 5 days -Please follow up on urine cultures and adjust antibiotics accordingly  Dysarthria/dysphonia -This has been a chronic problem for the patient -The patient's speech is fluent when she is distracted  Diabetes mellitus type 2 -06/04/2015 hemoglobin A1c 7.0 -Continue home dose Januvia  Recurrent PE -Patient has had history of PE in 2013 and in March 2016 -Case was discussed with the patient's hematologist, Dr. [name] -Restart apixiban--30 day card given prior to d/c -Dr. [name] will set up follow-up appointment within 1 month for the patient -02/22/2016 d-dimer 0.55 -Patient had previously intermediately elevated anti-cardiolipin antibody  Essential tremor  -continue home dose propranolol as blood pressure allows   Depression/anxiety  -Continue Wellbutrin, clonazepam, cymbalta  Cervical radiculopathy -Continue gabapentin -Follow up with outpatient neurology, Dr. [name]  Discharge Instructions      Discharge Instructions    Diet Carb Modified    Complete by:  As directed      Increase activity slowly    Complete by:  As directed             Medication List    STOP taking these medications        dabigatran 75 MG Caps capsule  Commonly known as:  PRADAXA     rizatriptan 10 MG tablet  Commonly known as:  MAXALT      TAKE these medications        Acidophilus/Goat Milk Caps  Take by mouth daily.     albuterol 108 (90 Base) MCG/ACT inhaler  Commonly  known as:  PROVENTIL HFA;VENTOLIN HFA  Inhale 1 puff into the lungs every 6 (six) hours as needed for wheezing or  shortness of breath.     albuterol (2.5 MG/3ML) 0.083% nebulizer solution  Commonly known as:  PROVENTIL  Take 2.5 mg by nebulization every 6 (six) hours as needed for wheezing or shortness of breath. Reported on 01/10/2016     apixaban 5 MG Tabs tablet  Commonly known as:  ELIQUIS  Take 2 tablets (10 mg total) by mouth 2 (two) times daily. X 7 days, then 1 tablet (5 mg) two times daily starting 03/31/16     benzonatate 100 MG capsule  Commonly known as:  TESSALON  Takes as needed     buPROPion 150 MG 24 hr tablet  Commonly known as:  WELLBUTRIN XL  Take 150 mg by mouth every morning.     ciprofloxacin 500 MG tablet  Commonly known as:  CIPRO  Take 1 tablet (500 mg total) by mouth 2 (two) times daily.     clonazePAM 0.5 MG tablet  Commonly known as:  KLONOPIN  Take 1 tablet (0.5 mg total) by mouth at bedtime.     cyanocobalamin 500 MCG tablet  Take 1 tablet (500 mcg total) by mouth daily.     cyclobenzaprine 10 MG tablet  Commonly known as:  FLEXERIL  Take 1 tablet (10 mg total) by mouth at bedtime.     DULoxetine 60 MG capsule  Commonly known as:  CYMBALTA  Take 1 capsule (60 mg total) by mouth every morning.     fluticasone 50 MCG/ACT nasal spray  Commonly known as:  FLONASE  Place 1 spray into both nostrils daily as needed for allergies or rhinitis.     gabapentin 800 MG tablet  Commonly known as:  NEURONTIN  800mg  in the morning and 1600mg  at pm     levothyroxine 25 MCG tablet  Commonly known as:  SYNTHROID, LEVOTHROID  Take 25 mcg by mouth every morning.     mometasone-formoterol 100-5 MCG/ACT Aero  Commonly known as:  DULERA  Inhale 2 puffs into the lungs 2 (two) times daily.     ondansetron 4 MG tablet  Commonly known as:  ZOFRAN  Take 4 mg by mouth every 8 (eight) hours as needed for nausea or vomiting.     propranolol 60 MG tablet  Commonly known as:  INDERAL  Take 1 tablet (60 mg total) by mouth 2 (two) times daily.     sitaGLIPtin 100 MG tablet   Commonly known as:  JANUVIA  Take 100 mg by mouth every evening. Reported on 01/10/2016     SUMAtriptan Succinate Refill 4 MG/0.5ML Soct  Inject 4 mg into the skin as needed.     valACYclovir 1000 MG tablet  Commonly known as:  VALTREX  Take 2,000 mg by mouth 2 (two) times daily as needed (Fever blisters). Reported on 01/10/2016        Allergies  Allergen Reactions  . Tape Rash    Rash with paper tape  . Bee Venom     unknown  . Etodolac Nausea And Vomiting  . Morphine Other (See Comments)    Hallucinations  . Other Itching and Swelling  . Propoxyphene   . Propoxyphene N-Acetaminophen Nausea And Vomiting  . Poison Ivy Extract  [Extract Of Poison Ivy] Itching and Swelling  . Penicillins Rash         Consultations:  Neurology   Procedures/Studies: Mr [facility] Contrast  03/22/2016  CLINICAL DATA:  70 year old diabetic hypertensive female with right-sided weakness. Subsequent encounter. EXAM: MRI HEAD WITHOUT CONTRAST MRA HEAD WITHOUT CONTRAST TECHNIQUE: Multiplanar, multiecho pulse sequences of the brain and surrounding structures were obtained without intravenous contrast. Angiographic images of the head were obtained using MRA technique without contrast. COMPARISON:  03/22/2016 CT.  No comparison brain MR. FINDINGS: MRI HEAD FINDINGS No acute infarct or intracranial hemorrhage. Very mild chronic microvascular changes. Remote tiny right cerebellar infarct. Mild global atrophy without hydrocephalus. No intracranial mass lesion noted on this unenhanced exam. Cervical medullary junction unremarkable. Post lens replacement without acute orbital abnormality. MRA HEAD FINDINGS Anterior circulation without medium or large size vessel significant stenosis or occlusion. Ectatic cavernous segment internal carotid artery with ectasia more notable on the left. Ectatic distal vertical cervical segment of the right internal carotid artery. Mild middle cerebral artery branch vessel  irregularity bilaterally. Ectatic vertebral arteries without significant narrowing. Fenestrated appearance proximal basilar artery. Nonvisualized posterior inferior cerebellar arteries. Mild narrowing distal basilar artery. Slightly prominent basilar tip without aneurysm noted. Mild narrowing posterior cerebral artery distal branches. IMPRESSION: MRI HEAD No acute infarct or intracranial hemorrhage. Very mild chronic microvascular changes. Remote tiny right cerebellar infarct. Mild global atrophy without hydrocephalus. No intracranial mass lesion noted on this unenhanced exam. MRA HEAD Anterior circulation without medium or large size vessel significant stenosis or occlusion. Ectatic cavernous segment internal carotid artery with ectasia more notable on the left. Ectatic distal vertical cervical segment of the right internal carotid artery. Mild middle cerebral artery branch vessel irregularity bilaterally. Ectatic vertebral arteries without significant narrowing. Nonvisualized posterior inferior cerebellar arteries. Mild narrowing distal basilar artery. Slightly prominent basilar tip without aneurysm noted. Electronically Signed   By: Genia Del M.D.   On: 03/22/2016 18:26   Mr Brain Wo Contrast  03/22/2016  CLINICAL DATA:  70 year old diabetic hypertensive female with right-sided weakness. Subsequent encounter. EXAM: MRI HEAD WITHOUT CONTRAST MRA HEAD WITHOUT CONTRAST TECHNIQUE: Multiplanar, multiecho pulse sequences of the brain and surrounding structures were obtained without intravenous contrast. Angiographic images of the head were obtained using MRA technique without contrast. COMPARISON:  03/22/2016 CT.  No comparison brain MR. FINDINGS: MRI HEAD FINDINGS No acute infarct or intracranial hemorrhage. Very mild chronic microvascular changes. Remote tiny right cerebellar infarct. Mild global atrophy without hydrocephalus. No intracranial mass lesion noted on this unenhanced exam. Cervical medullary  junction unremarkable. Post lens replacement without acute orbital abnormality. MRA HEAD FINDINGS Anterior circulation without medium or large size vessel significant stenosis or occlusion. Ectatic cavernous segment internal carotid artery with ectasia more notable on the left. Ectatic distal vertical cervical segment of the right internal carotid artery. Mild middle cerebral artery branch vessel irregularity bilaterally. Ectatic vertebral arteries without significant narrowing. Fenestrated appearance proximal basilar artery. Nonvisualized posterior inferior cerebellar arteries. Mild narrowing distal basilar artery. Slightly prominent basilar tip without aneurysm noted. Mild narrowing posterior cerebral artery distal branches. IMPRESSION: MRI HEAD No acute infarct or intracranial hemorrhage. Very mild chronic microvascular changes. Remote tiny right cerebellar infarct. Mild global atrophy without hydrocephalus. No intracranial mass lesion noted on this unenhanced exam. MRA HEAD Anterior circulation without medium or large size vessel significant stenosis or occlusion. Ectatic cavernous segment internal carotid artery with ectasia more notable on the left. Ectatic distal vertical cervical segment of the right internal carotid artery. Mild middle cerebral artery branch vessel irregularity bilaterally. Ectatic vertebral arteries without significant narrowing. Nonvisualized posterior inferior cerebellar arteries. Mild narrowing distal basilar artery. Slightly prominent basilar tip without aneurysm  noted. Electronically Signed   By: Genia Del M.D.   On: 03/22/2016 18:26   Ct Head Code Stroke W/o Cm  03/22/2016  CLINICAL DATA:  Code stroke, right-sided weakness EXAM: CT HEAD WITHOUT CONTRAST TECHNIQUE: Contiguous axial images were obtained from the base of the skull through the vertex without intravenous contrast. COMPARISON:  08/16/2015 FINDINGS: No skull fracture is noted. Paranasal sinuses and mastoid air cells  are unremarkable. Mild cerebral atrophy is noted. No intracranial hemorrhage, mass effect or midline shift. Mild periventricular white matter decreased attenuation probable due to chronic small vessel ischemic changes. No definite acute cortical infarction. No mass lesion is noted on this unenhanced scan. Atherosclerotic calcifications of carotid siphon. IMPRESSION: 1. No acute intracranial abnormality. Mild cerebral atrophy. Mild periventricular white matter decreased attenuation probable due to chronic small vessel ischemic changes. No definite acute cortical infarction. Electronically Signed   By: Lahoma Crocker M.D.   On: 03/22/2016 13:05        Discharge Exam: Filed Vitals:   03/23/16 1000 03/23/16 1435  BP: 113/59 110/62  Pulse: 63 71  Temp: 98.8 F (37.1 C) 98 F (36.7 C)  Resp: 18 18   Filed Vitals:   03/23/16 0458 03/23/16 0902 03/23/16 1000 03/23/16 1435  BP: 119/65  113/59 110/62  Pulse: 77  63 71  Temp:   98.8 F (37.1 C) 98 F (36.7 C)  TempSrc:   Oral Oral  Resp: 18  18 18   Weight:      SpO2: 93% 89% 91% 91%    General: Pt is alert, awake, not in acute distress Cardiovascular: RRR, S1/S2 +, no rubs, no gallops Respiratory: CTA bilaterally, no wheezing, no rhonchi Abdominal: Soft, NT, ND, bowel sounds + Extremities: no edema, no cyanosis   The results of significant diagnostics from this hospitalization (including imaging, microbiology, ancillary and laboratory) are listed below for reference.    Significant Diagnostic Studies: Mr [facility] P[facility] Wo Contrast  03/22/2016  CLINICAL DATA:  70 year old diabetic hypertensive female with right-sided weakness. Subsequent encounter. EXAM: MRI HEAD WITHOUT CONTRAST MRA HEAD WITHOUT CONTRAST TECHNIQUE: Multiplanar, multiecho pulse sequences of the brain and surrounding structures were obtained without intravenous contrast. Angiographic images of the head were obtained using MRA technique without contrast. COMPARISON:  03/22/2016 CT.   No comparison brain MR. FINDINGS: MRI HEAD FINDINGS No acute infarct or intracranial hemorrhage. Very mild chronic microvascular changes. Remote tiny right cerebellar infarct. Mild global atrophy without hydrocephalus. No intracranial mass lesion noted on this unenhanced exam. Cervical medullary junction unremarkable. Post lens replacement without acute orbital abnormality. MRA HEAD FINDINGS Anterior circulation without medium or large size vessel significant stenosis or occlusion. Ectatic cavernous segment internal carotid artery with ectasia more notable on the left. Ectatic distal vertical cervical segment of the right internal carotid artery. Mild middle cerebral artery branch vessel irregularity bilaterally. Ectatic vertebral arteries without significant narrowing. Fenestrated appearance proximal basilar artery. Nonvisualized posterior inferior cerebellar arteries. Mild narrowing distal basilar artery. Slightly prominent basilar tip without aneurysm noted. Mild narrowing posterior cerebral artery distal branches. IMPRESSION: MRI HEAD No acute infarct or intracranial hemorrhage. Very mild chronic microvascular changes. Remote tiny right cerebellar infarct. Mild global atrophy without hydrocephalus. No intracranial mass lesion noted on this unenhanced exam. MRA HEAD Anterior circulation without medium or large size vessel significant stenosis or occlusion. Ectatic cavernous segment internal carotid artery with ectasia more notable on the left. Ectatic distal vertical cervical segment of the right internal carotid artery. Mild middle cerebral artery branch vessel  irregularity bilaterally. Ectatic vertebral arteries without significant narrowing. Nonvisualized posterior inferior cerebellar arteries. Mild narrowing distal basilar artery. Slightly prominent basilar tip without aneurysm noted. Electronically Signed   By: Genia Del M.D.   On: 03/22/2016 18:26   Mr Brain Wo Contrast  03/22/2016  CLINICAL DATA:   69 year old diabetic hypertensive female with right-sided weakness. Subsequent encounter. EXAM: MRI HEAD WITHOUT CONTRAST MRA HEAD WITHOUT CONTRAST TECHNIQUE: Multiplanar, multiecho pulse sequences of the brain and surrounding structures were obtained without intravenous contrast. Angiographic images of the head were obtained using MRA technique without contrast. COMPARISON:  03/22/2016 CT.  No comparison brain MR. FINDINGS: MRI HEAD FINDINGS No acute infarct or intracranial hemorrhage. Very mild chronic microvascular changes. Remote tiny right cerebellar infarct. Mild global atrophy without hydrocephalus. No intracranial mass lesion noted on this unenhanced exam. Cervical medullary junction unremarkable. Post lens replacement without acute orbital abnormality. MRA HEAD FINDINGS Anterior circulation without medium or large size vessel significant stenosis or occlusion. Ectatic cavernous segment internal carotid artery with ectasia more notable on the left. Ectatic distal vertical cervical segment of the right internal carotid artery. Mild middle cerebral artery branch vessel irregularity bilaterally. Ectatic vertebral arteries without significant narrowing. Fenestrated appearance proximal basilar artery. Nonvisualized posterior inferior cerebellar arteries. Mild narrowing distal basilar artery. Slightly prominent basilar tip without aneurysm noted. Mild narrowing posterior cerebral artery distal branches. IMPRESSION: MRI HEAD No acute infarct or intracranial hemorrhage. Very mild chronic microvascular changes. Remote tiny right cerebellar infarct. Mild global atrophy without hydrocephalus. No intracranial mass lesion noted on this unenhanced exam. MRA HEAD Anterior circulation without medium or large size vessel significant stenosis or occlusion. Ectatic cavernous segment internal carotid artery with ectasia more notable on the left. Ectatic distal vertical cervical segment of the right internal carotid artery. Mild  middle cerebral artery branch vessel irregularity bilaterally. Ectatic vertebral arteries without significant narrowing. Nonvisualized posterior inferior cerebellar arteries. Mild narrowing distal basilar artery. Slightly prominent basilar tip without aneurysm noted. Electronically Signed   By: Genia Del M.D.   On: 03/22/2016 18:26   Ct Head Code Stroke W/o Cm  03/22/2016  CLINICAL DATA:  Code stroke, right-sided weakness EXAM: CT HEAD WITHOUT CONTRAST TECHNIQUE: Contiguous axial images were obtained from the base of the skull through the vertex without intravenous contrast. COMPARISON:  08/16/2015 FINDINGS: No skull fracture is noted. Paranasal sinuses and mastoid air cells are unremarkable. Mild cerebral atrophy is noted. No intracranial hemorrhage, mass effect or midline shift. Mild periventricular white matter decreased attenuation probable due to chronic small vessel ischemic changes. No definite acute cortical infarction. No mass lesion is noted on this unenhanced scan. Atherosclerotic calcifications of carotid siphon. IMPRESSION: 1. No acute intracranial abnormality. Mild cerebral atrophy. Mild periventricular white matter decreased attenuation probable due to chronic small vessel ischemic changes. No definite acute cortical infarction. Electronically Signed   By: Lahoma Crocker M.D.   On: 03/22/2016 13:05     Microbiology: Recent Results (from the past 240 hour(s))  Urine culture     Status: Abnormal (Preliminary result)   Collection Time: 03/22/16  3:30 PM  Result Value Ref Range Status   Specimen Description URINE, CLEAN CATCH  Final   Special Requests NONE  Final   Culture >=100,000 COLONIES/mL GRAM NEGATIVE RODS (A)  Final   Report Status PENDING  Incomplete     Labs: Basic Metabolic Panel:  Recent Labs Lab 03/22/16 1258 03/22/16 1303  NA 138 139  K 4.1 4.0  CL 104 101  CO2 27  --  GLUCOSE 103* 98  BUN 5* 5*  CREATININE 0.80 0.70  CALCIUM 8.8*  --    Liver Function  Tests:  Recent Labs Lab 03/22/16 1258  AST 18  ALT 16  ALKPHOS 77  BILITOT 0.4  PROT 6.5  ALBUMIN 3.3*   No results for input(s): LIPASE, AMYLASE in the last 168 hours. No results for input(s): AMMONIA in the last 168 hours. CBC:  Recent Labs Lab 03/22/16 1258 03/22/16 1303 03/23/16 0924  WBC 8.0  --  7.5  NEUTROABS 5.6  --   --   HGB 12.8 13.6 12.8  HCT 41.1 40.0 40.9  MCV 87.1  --  87.0  PLT 230  --  182   Cardiac Enzymes: No results for input(s): CKTOTAL, CKMB, CKMBINDEX, TROPONINI in the last 168 hours. BNP: Invalid input(s): POCBNP CBG:  Recent Labs Lab 03/22/16 1257 03/22/16 2141 03/23/16 0633 03/23/16 1136 03/23/16 1649  GLUCAP 99 255* 183* 127* 110*    Time coordinating discharge:  Greater than 30 minutes  Signed:  Dia Donate, DO [facility] Pager: [phone] 03/23/2016, 6:42 PM

## 2016-03-23 NOTE — Progress Notes (Signed)
   03/23/16 1631  PT Visit Information  Last PT Received On 03/23/16  Assistance Needed +1  History of Present Illness 70 y.o. female with medical history significant for chronic paresthesia of both lower extremities resulting in gait instability and frequent falls, spasmodic dysphonia as well as left cervical radiculopathy although by Dr. [name] with neurology. Patient also has a history of essential tremor, chronic elevated d-dimer after being diagnosed with PE/DVT in 2016 post laparoscopic cholecystectomy. She has diabetes and she is on Januvia samples. She has chronic anemia and hypothyroidism. Patient reports that she was in her usual state of health until she awakened this morning and noticed she was dizzy. She described this as a sensation that she was spinning. Because of the sensation she has been unable to ambulate; MRI head on 03/22/16 indicated No acute infarct or intracranial hemorrhage; remote right small cerebellar infarct; MBS on 01/03/16 indicated normal swallowing function  Subjective Data  Subjective "I feel so much better."  Precautions  Precautions Fall  Restrictions  Weight Bearing Restrictions No  Pain Assessment  Pain Assessment No/denies pain  Cognition  Arousal/Alertness Awake/alert  Behavior During Therapy Anxious  Overall Cognitive Status Within Functional Limits for tasks assessed  Bed Mobility  Overal bed mobility Independent  Transfers  Overall transfer level Independent  Ambulation/Gait  General Gait Details Did not walk. REviewed handouts/exercises.  Modified Rankin (Stroke Patients Only)  Pre-Morbid Rankin Score 3  Modified Rankin 4  Balance  Overall balance assessment Needs assistance  Sitting-balance support No upper extremity supported;Feet supported  Sitting balance-Leahy Scale Good  Standing balance support No upper extremity supported;During functional activity  Standing balance-Leahy Scale Good  Standing balance comment Standing up pulling up pants  on arrival.   General Comments  General comments (skin integrity, edema, etc.) Gave pt handouts for BPPV, Nestor Lewandowsky exericise and x1 exercises progression .  Pt appreciative and can return demonstration.   Exercises  Exercises Other exercises  Other Exercises  Other Exercises Nestor Lewandowsky exercise  Other Exercises x1 exercises.   PT - End of Session  Equipment Utilized During Treatment Gait belt  Activity Tolerance Patient tolerated treatment well  Patient left in bed;with call bell/phone within reach  Nurse Communication Mobility status  PT - Assessment/Plan  PT Plan Current plan remains appropriate  PT Frequency (ACUTE ONLY) Min 3X/week  Follow Up Recommendations Home health PT;Supervision - Intermittent (vestibular rehab)  PT equipment None recommended by PT  PT Goal Progression  Progress towards PT goals Progressing toward goals  PT Time Calculation  PT Start Time (ACUTE ONLY) 1620  PT Stop Time (ACUTE ONLY) 1630  PT Time Calculation (min) (ACUTE ONLY) 10 min  PT General Charges  $$ ACUTE PT VISIT 1 Procedure  PT Treatments  $Self Care/Home Management 8-22  [facility] Acute Rehabilitation [phone] (pager)

## 2016-03-23 NOTE — Progress Notes (Signed)
OT Cancellation Note  Patient Details Name: Hayley [name] MRN: [id number] DOB: 03-Oct-1945   Cancelled Treatment:    Reason Eval/Treat Not Completed: Other (comment). Pt just received lunch, requesting OT check back this afternoon. This is 2nd attempt at OT eval. Will follow up as time allows.  Binnie Kand M.S., OTR/L Pager: [phone]   03/23/2016, 11:28 AM

## 2016-03-23 NOTE — Progress Notes (Unsigned)
I was called by Hayley [name] hospitalist. She was admitted with some neurologic symptoms which fortunately appeared to have been transient and not due to a stroke.  Apparently she took herself off anticoagulants in April because she had a cough. They wanted to know if we should restart them. The last d-dimer we obtained was still positive so she should be still on it. Whether she wants Primaxin I, apixaban or different agents is really whichever she prefers. The hospitalist will give her a month's supply and she is scheduled for another d-dimer here mid July and to see me the following week

## 2016-03-23 NOTE — Care Management Note (Signed)
Case Management Note  Patient Details  Name: Hayley [name] MRN: [id number] Date of Birth: 12/04/1945  Subjective/Objective:                    Action/Plan: Pt discharging home with orders for HH services. CM met with the patient and provided her a list of HH agencies in the [place] area. She stated she has used [facility] in the past and would like to use them again. CM left a message with Mary from [facility] ([facility]) and informed her of the referral . Information also faxed to the [facility] office. Bedside RN updated.   Expected Discharge Date:                  Expected Discharge Plan:  Home w Home Health Services  In-House Referral:     Discharge planning Services  CM Consult  Post Acute Care Choice:  Home Health Choice offered to:  Patient  DME Arranged:    DME Agency:     HH Arranged:  PT, OT, Speech Therapy HH Agency:  [facility] (formerly [facility])  Status of Service:  Completed, signed off  If discussed at Long Length of Stay Meetings, dates discussed:    Additional Comments:  Kelli F Willard, RN 03/23/2016, 4:45 PM  

## 2016-03-23 NOTE — Telephone Encounter (Signed)
Spoke with husband to inform pt of 7/24 appt date/time

## 2016-03-23 NOTE — Progress Notes (Signed)
VASCULAR LAB PRELIMINARY  PRELIMINARY  PRELIMINARY  PRELIMINARY  Carotid duplex has been completed.    Bilateral:  1-39% ICA stenosis.  Vertebral artery flow is antegrade.     Jeromey Kruer, RVT, RDMS 03/23/2016, 1:46 PM

## 2016-03-23 NOTE — Evaluation (Signed)
Physical Therapy Evaluation Patient Details Name: Hayley JenkinsDiana S Nuckles MRN: [id number] DOB: 12/20/1945 Today's Date: 03/23/2016   History of Present Illness  70 y.o. female with medical history significant for chronic paresthesia of both lower extremities resulting in gait instability and frequent falls, spasmodic dysphonia as well as left cervical radiculopathy although by Dr. Terrace ArabiaYan with neurology. Patient also has a history of essential tremor, chronic elevated d-dimer after being diagnosed with PE/DVT in 2016 post laparoscopic cholecystectomy. She has diabetes and she is on Januvia samples. She has chronic anemia and hypothyroidism. Patient reports that she was in her usual state of health until she awakened this morning and noticed she was dizzy. She described this as a sensation that she was spinning. Because of the sensation she has been unable to ambulate; MRI head on 03/22/16 indicated No acute infarct or intracranial hemorrhage; remote right small cerebellar infarct; MBS on 01/03/16 indicated normal swallowing function  Clinical Impression  Pt admitted with above diagnosis. Pt currently with functional limitations due to the deficits listed below (see PT Problem List). Pt treated for right BPPV with good success with dizziness initially 6/10 and after treatment 2/10.  Pt very happy with results and able to ambulate withRW in halls.  Will check back in pm and treat further as needed.   Pt will benefit from skilled PT to increase their independence and safety with mobility to allow discharge to the venue listed below.      Follow Up Recommendations Home health PT;Supervision - Intermittent (vestibular rehab)    Equipment Recommendations  None recommended by PT    Recommendations for Other Services       Precautions / Restrictions Precautions Precautions: Fall Restrictions Weight Bearing Restrictions: No      Mobility  Bed Mobility Overal bed mobility: Independent                 Transfers Overall transfer level: Independent                  Ambulation/Gait Ambulation/Gait assistance: Min guard Ambulation Distance (Feet): 200 Feet Assistive device: Rolling walker (2 wheeled) Gait Pattern/deviations: Step-through pattern;Decreased stride length   Gait velocity interpretation: Below normal speed for age/gender General Gait Details: Pt ambulated with good gait pattern.  No LOB noted.  This was after vestibular treatment.   Stairs            Wheelchair Mobility    Modified Rankin (Stroke Patients Only) Modified Rankin (Stroke Patients Only) Pre-Morbid Rankin Score: Moderate disability Modified Rankin: Moderately severe disability     Balance Overall balance assessment: Needs assistance Sitting-balance support: No upper extremity supported;Feet supported Sitting balance-Leahy Scale: Fair     Standing balance support: Bilateral upper extremity supported;During functional activity Standing balance-Leahy Scale: Poor Standing balance comment: relies on RW at present.              High level balance activites: Direction changes;Turns;Sudden stops;Head turns High Level Balance Comments: All of above with min guard assist.              Pertinent Vitals/Pain Pain Assessment: No/denies pain  VSS    Home Living Family/patient expects to be discharged to:: Private residence Living Arrangements: Spouse/significant other Available Help at Discharge: Family;Friend(s) Type of Home: House Home Access: Stairs to enter Entrance Stairs-Rails: Right Entrance Stairs-Number of Steps: 4 Home Layout: Able to live on main level with bedroom/bathroom;Multi-level Home Equipment: Walker - 2 wheels;Bedside commode;Shower seat Additional Comments: spouse works    Prior  Function Level of Independence: Independent               Hand Dominance   Dominant Hand: Right    Extremity/Trunk Assessment   Upper Extremity Assessment: Defer to OT  evaluation           Lower Extremity Assessment: Overall WFL for tasks assessed      Cervical / Trunk Assessment: Normal  Communication   Communication: No difficulties  Cognition Arousal/Alertness: Awake/alert Behavior During Therapy: Anxious Overall Cognitive Status: Within Functional Limits for tasks assessed                      General Comments General comments (skin integrity, edema, etc.): Tested negative for supine head roll.  Tested positive for right BPPV therefore treated with right canalith repositioning maneuver. Dizziness initially 6/10 and down to 2/10 after treatement.  Pt was ecstatic that her dizziness was so much better and she was able to ambulate with RW with min guard assist.     Exercises        Assessment/Plan    PT Assessment Patient needs continued PT services  PT Diagnosis  (dizziness)   PT Problem List Decreased activity tolerance;Decreased balance;Decreased mobility;Decreased knowledge of precautions;Decreased knowledge of use of DME (dizziness)  PT Treatment Interventions DME instruction;Gait training;Stair training;Functional mobility training;Therapeutic activities;Therapeutic exercise;Balance training;Patient/family education (vestibular rehab)   PT Goals (Current goals can be found in the Care Plan section) Acute Rehab PT Goals Patient Stated Goal: to get better PT Goal Formulation: With patient Time For Goal Achievement: 04/06/16 Potential to Achieve Goals: Good    Frequency Min 3X/week   Barriers to discharge Decreased caregiver support      Co-evaluation               End of Session Equipment Utilized During Treatment: Gait belt Activity Tolerance: Patient limited by fatigue Patient left: in bed;with call bell/phone within reach;with bed alarm set Nurse Communication: Mobility status    Functional Assessment Tool Used: clinical judgment Functional Limitation: Mobility: Walking and moving around Mobility:  Walking and Moving Around Current Status [id number]): At least 1 percent but less than 20 percent impaired, limited or restricted Mobility: Walking and Moving Around Goal Status [phone]): At least 1 percent but less than 20 percent impaired, limited or restricted    Time: 1149-1220 PT Time Calculation (min) (ACUTE ONLY): 31 min   Charges:   PT Evaluation $PT Eval Moderate Complexity: 1 Procedure PT Treatments $Canalith Rep Proc: 8-22 mins   PT G Codes:   PT G-Codes **NOT FOR INPATIENT CLASS** Functional Assessment Tool Used: clinical judgment Functional Limitation: Mobility: Walking and moving around Mobility: Walking and Moving Around Current Status [id number]): At least 1 percent but less than 20 percent impaired, limited or restricted Mobility: Walking and Moving Around Goal Status [phone]): At least 1 percent but less than 20 percent impaired, limited or restricted    Denice Paradise 03/23/2016, 1:54 PM [place] Rosaleen Mazer,PT Acute Rehabilitation [phone] (pager)

## 2016-03-23 NOTE — Care Management Note (Addendum)
Case Management Note  Patient Details  Name: SILA MCCOIN MRN: [id number] Date of Birth: 1945-12-27  Subjective/Objective:                    Action/Plan: Pt has not been taking her Pradaxa due to the copay. MD also interested in switching her pradaxa to Eliquis. Benefits check revealed:   Pradaxa 75mg . BID $150.21,  ELIQUIS 5MG . BID $157.26  NO-PRE-AUTH. REQUIRED.   CM will follow for discharge needs.   Addendum (1120): Pt given 30 day free card for Eliquis and instructed on calling the number on the packet to arrange for patient assistance. Pt in agreement.   Expected Discharge Date:                  Expected Discharge Plan:     In-House Referral:     Discharge planning Services     Post Acute Care Choice:    Choice offered to:     DME Arranged:    DME Agency:     HH Arranged:    HH Agency:     Status of Service:     If discussed at [employer], dates discussed:    Additional Comments:  Pollie Friar, RN 03/23/2016, 11:00 AM

## 2016-03-23 NOTE — Care Management Obs Status (Signed)
[facility] NOTIFICATION   Patient Details  Name: MARIANTHI FLORESTAL MRN: [id number] Date of Birth: [birth year]   Medicare Observation Status Notification Given:  Yes (MRI negative.)    Pollie Friar, RN 03/23/2016, 11:22 AM

## 2016-03-23 NOTE — Evaluation (Signed)
Occupational Therapy Evaluation Patient Details Name: FREDRIKA THORE MRN: [id number] DOB: [birth year] Today's Date: 03/23/2016    History of Present Illness 70 y.o. female with medical history significant for chronic paresthesia of both lower extremities resulting in gait instability and frequent falls, spasmodic dysphonia as well as left cervical radiculopathy although by Dr. [name] with neurology. Patient also has a history of essential tremor, chronic elevated d-dimer after being diagnosed with PE/DVT in 2016 post laparoscopic cholecystectomy. She has diabetes and she is on Januvia samples. She has chronic anemia and hypothyroidism. Patient reports that she was in her usual state of health until she awakened this morning and noticed she was dizzy. She described this as a sensation that she was spinning. Because of the sensation she has been unable to ambulate; MRI head on 03/22/16 indicated No acute infarct or intracranial hemorrhage; remote right small cerebellar infarct; MBS on 01/03/16 indicated normal swallowing function   Clinical Impression   Pt reports she was independent with ADLs PTA. Currently pt overall supervision for safety with ADLs and functional mobility. Educated pt on home safety and energy conservation strategies. Pt planning to d/c home with intermittent supervision from family. Recommending HHOT for follow up to maximize independence and safety with ADLs and functional mobility upon return home. Pt planning to d/c home this afternoon; therefore will defer all further OT needs to next venue. Please re-consult if needs change. Thank you for this referral.    Follow Up Recommendations  Home health OT;Supervision - Intermittent    Equipment Recommendations  None recommended by OT    Recommendations for Other Services       Precautions / Restrictions Precautions Precautions: Fall Restrictions Weight Bearing Restrictions: No      Mobility Bed Mobility Overal bed mobility:  Independent                Transfers Overall transfer level: Independent Equipment used: None                  Balance Overall balance assessment: Needs assistance Sitting-balance support: Feet supported;No upper extremity supported Sitting balance-Leahy Scale: Good     Standing balance support: No upper extremity supported;During functional activity Standing balance-Leahy Scale: Good Standing balance comment: relies on RW at present.              High level balance activites: Direction changes;Turns;Sudden stops;Head turns High Level Balance Comments: All of above with min guard assist.             ADL Overall ADL's : Needs assistance/impaired Eating/Feeding: Independent;Sitting   Grooming: Supervision/safety;Standing   Upper Body Bathing: Set up;Sitting   Lower Body Bathing: Supervison/ safety;Sit to/from stand   Upper Body Dressing : Set up;Sitting   Lower Body Dressing: Supervision/safety;Sit to/from stand   Toilet Transfer: Supervision/safety;Ambulation;Regular Toilet   Toileting- Water quality scientist and Hygiene: Supervision/safety       Functional mobility during ADLs: Supervision/safety General ADL Comments: No unsteadiness or LOB noted during functional activities in session. Pt reports she has not been able to garden and complete household chores like she used to.     Vision Vision Assessment?: No apparent visual deficits   Perception     Praxis      Pertinent Vitals/Pain Pain Assessment: No/denies pain     Hand Dominance Right   Extremity/Trunk Assessment Upper Extremity Assessment Upper Extremity Assessment: RUE deficits/detail RUE Deficits / Details: Pt reports slight numbness. Strength and AROM overall WFL. RUE Sensation: decreased light touch  Lower Extremity Assessment Lower Extremity Assessment: Defer to PT evaluation   Cervical / Trunk Assessment Cervical / Trunk Assessment: Normal   Communication  Communication Communication: No difficulties   Cognition Arousal/Alertness: Awake/alert Behavior During Therapy: Anxious Overall Cognitive Status: Within Functional Limits for tasks assessed                     General Comments       Exercises       Shoulder Instructions      Home Living Family/patient expects to be discharged to:: Private residence Living Arrangements: Spouse/significant other Available Help at Discharge: Family;Friend(s);Available PRN/intermittently (husband works during the day) Type of Home: House Home Access: Stairs to enter Technical brewer of Steps: 4 Entrance Stairs-Rails: Right Home Layout: Able to live on main level with bedroom/bathroom;Multi-level Alternate Level Stairs-Number of Steps: flight Alternate Level Stairs-Rails: Can reach both Bathroom Shower/Tub: Tub/shower unit Shower/tub characteristics: Curtain Bathroom Toilet: Handicapped height     Home Equipment: Environmental consultant - 2 wheels;Bedside commode;Shower seat   Additional Comments: spouse works  Lives With: Spouse    Prior Functioning/Environment Level of Independence: Independent        Comments: IT trainer for safety    OT Diagnosis: Generalized weakness   OT Problem List:     OT Treatment/Interventions:      OT Goals(Current goals can be found in the care plan section) Acute Rehab OT Goals Patient Stated Goal: to get better OT Goal Formulation: All assessment and education complete, DC therapy  OT Frequency:     Barriers to D/C:            Co-evaluation              End of Session    Activity Tolerance: Patient tolerated treatment well Patient left: in bed;with call bell/phone within reach   Time: 1535-1600 OT Time Calculation (min): 25 min Charges:  OT General Charges $OT Visit: 1 Procedure OT Evaluation $OT Eval Low Complexity: 1 Procedure OT Treatments $Self Care/Home Management : 8-22 mins G-Codes: OT G-codes **NOT FOR INPATIENT  CLASS** Functional Assessment Tool Used: Clinical judgement Functional Limitation: Self care Self Care Current Status [id number]): At least 1 percent but less than 20 percent impaired, limited or restricted Self Care Goal Status [id number]): At least 1 percent but less than 20 percent impaired, limited or restricted Self Care Discharge Status [phone]): At least 1 percent but less than 20 percent impaired, limited or restricted   Binnie Kand M.S., OTR/L Pager: [phone]  03/23/2016, 4:13 PM

## 2016-03-23 NOTE — Evaluation (Signed)
Speech Language Pathology Evaluation Patient Details Name: Hayley [name] MRN: [id number] DOB: Feb 02, 1946 Today's Date: 03/23/2016 Time: [id number] SLP Time Calculation (min) (ACUTE ONLY): 18 min  Problem List:  Patient Active Problem List   Diagnosis Date Noted  . Paresthesia of both lower extremities 03/22/2016  . Vertigo 03/22/2016  . Acute UTI 03/22/2016  . Essential tremor 03/12/2016  . Fall 03/12/2016  . Low back pain 03/12/2016  . Diabetes mellitus without complication ([place]) [id number]  . Essential hypertension 06/03/2015  . OA (osteoarthritis) of knee 12/06/2014  . Hypothyroidism 03/03/2014  . Asthma 03/03/2014  . Acute blood loss anemia 03/03/2014  . Left knee pain 03/03/2014  . Failed total knee, left ([place]) 02/24/2014  . Failed total knee arthroplasty ([place]) 02/24/2014  . OSA on CPAP 12/23/2013  . History of pulmonary embolus (PE) 12/21/2013  . Appendicitis, acute, with peritonitis 11/27/2011  . 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 Obesity, Class III, BMI 40-49.9 (morbid obesity) ([place]) 04/27/2010  . ANEMIA 04/27/2010  . ANXIETY DEPRESSION 04/27/2010  . Migraine 04/27/2010  . NEUROPATHY 04/27/2010  . HEMORRHOIDS 04/27/2010  . GERD 04/27/2010  . DIVERTICULOSIS, COLON 04/27/2010  . IBS 04/27/2010  . CHOLELITHIASIS 04/27/2010  . RENAL CALCULUS 04/27/2010   Past Medical History:  Past Medical History  Diagnosis Date  . Hyperthyroidism   . Toxic goiter   . Reflux   . Colon polyps   . Migraines   . 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 Obesity, Class III, BMI 40-49.9 (morbid obesity) ([place]) 04/27/2010    02-17-14 reports some weight loss- intentional  . Pelvic pain     02-17-14 "states thinks its back related on left groin"  . Anemia   . Clotting disorder ([place]) 02-17-14    S/p appendectomy-developed Pulmonary emboli-tx. warfarin-d/c 1 yr ago.  . Osteoarthritis     in back(chronic pain)  . Pulmonary emboli [facility])     s/p  Appendectomy '13 [facility]  . Diabetes mellitus without complication ([place])     "borderline"- oral med  . Right knee pain   . Sleep apnea     cpap - settings at 4   . Bronchitis     hx of   . Anxiety   . Depression   . Kidney stones   . Neuromuscular disorder ([place])     hands and throat"spasmodic dysphonia", tremors - thimb and forefinger   . Nausea Nov 15, 2014    cycle of migraine headaches and nausea   Past Surgical History:  Past Surgical History  Procedure Laterality Date  . Abdominal hysterectomy    . [place] diverticulum    . Kidney stone surgery    . Laparoscopic appendectomy  11/27/2011    Procedure: APPENDECTOMY LAPAROSCOPIC;  Surgeon: Adin Hector, MD;  Location: [facility];  Service: General;  Laterality: N/A;  . Cholecystectomy    . Knee arthroscopy  1998    right  . Hand surgery  2004    both hands  . Botox injection      for migranes  . Cataract extraction, bilateral Bilateral 03/2013   . Vocal cord surgery  02-17-14    01-12-14 done in [place] [facility](s/p selective denervation/reinnervation recurrent laryngeal nerve surgery)  . Appendectomy      3'13 [facility] s/p developed  Pulmonary Emboli  . Total knee revision Left 02/24/2014    Procedure: LEFT TOTAL KNEE REVISION;  Surgeon: Gearlean Alf, MD;  Location: [facility];  Service: Orthopedics;  Laterality: Left;  . Total knee arthroplasty Right 12/06/2014    Procedure: RIGHT TOTAL KNEE ARTHROPLASTY;  Surgeon: Gaynelle Arabian, MD;  Location: [facility];  Service: Orthopedics;  Laterality: Right;   HPI:  70 y.o. female with medical history significant for chronic paresthesia of both lower extremities resulting in gait instability and frequent falls, spasmodic dysphonia as well as left cervical radiculopathy although by Dr. [name] with neurology. Patient also has a history of essential tremor, chronic elevated d-dimer after being diagnosed with PE/DVT in 2016 post laparoscopic cholecystectomy. She has diabetes and she is on Januvia samples.  She has chronic anemia and hypothyroidism. Patient reports that she was in her usual state of health until she awakened this morning and noticed she was dizzy. She described this as a sensation that she was spinning. Because of the sensation she has been unable to ambulate; MRI head on 03/22/16 indicated No acute infarct or intracranial hemorrhage; remote right small cerebellar infarct; MBS on 01/03/16 indicated normal swallowing function    Assessment / Plan / Recommendation Clinical Impression   Pt exhibited mild anomia during complex conversational tasks and this is a new occurrence per pt; slight right facial weakness at rest noted, but speech is 100% intelligible; naming tasks [facility] with exception of pt report of decreased word finding in conversation; cognition and auditory comprehension appear WFL as well; reading/graphic expression unable to be assessed d/t pt being transported for testing at end of SLE; pt does not report any difficulty in either area; ST f/u 1-2x for anomia while in house and/or f/u with [facility] or OP SLP prn for anomia recommended.    SLP Assessment  Patient needs continued Speech Language Pathology Services    Follow Up Recommendations  Other (comment) (TBD prn)    Frequency and Duration min 2x/week  1 week      SLP Evaluation Prior Functioning  Cognitive/Linguistic Baseline: Within functional limits Type of Home: House  Lives With: Spouse Available Help at Discharge: Family;Friend(s)   Cognition  Overall Cognitive Status: Within Functional Limits for tasks assessed Arousal/Alertness: Awake/alert Orientation Level: Oriented X4 Memory: Appears intact Awareness: Appears intact Problem Solving: Appears intact Safety/Judgment: Appears intact    Comprehension  Auditory Comprehension Overall Auditory Comprehension: Appears within functional limits for tasks assessed Yes/No Questions: Within Functional Limits Commands: Within Functional Limits Conversation:  Complex Visual Recognition/Discrimination Discrimination: Not tested Reading Comprehension Reading Status: Not tested    Expression Expression Primary Mode of Expression: Verbal Verbal Expression Overall Verbal Expression: Impaired Initiation: No impairment Level of Generative/Spontaneous Verbalization: Conversation Repetition: No impairment Naming: Impairment Responsive: 76-100% accurate Confrontation: Within functional limits Convergent: 75-100% accurate Divergent: 75-100% accurate Other Naming Comments:  (Pt c/o anomia at conversational level (new)) Pragmatics: No impairment Non-Verbal Means of Communication: Not applicable Written Expression Written Expression: Unable to assess (comment) (Pt leaving for testing)   Oral / Motor  Oral Motor/Sensory Function Overall Oral Motor/Sensory Function: Mild impairment Facial Symmetry: Abnormal symmetry right;Other (Comment) (slight at rest) Facial Strength: Within Functional Limits Facial Sensation: Within Functional Limits Lingual ROM: Within Functional Limits Lingual Symmetry: Within Functional Limits Lingual Strength: Within Functional Limits Lingual Sensation: Within Functional Limits Velum: Within Functional Limits Mandible: Within Functional Limits Motor Speech Overall Motor Speech: Appears within functional limits for tasks assessed Respiration: Within functional limits Phonation: Breathy;Other (comment) (hx of spasmodic dysphonia) Resonance: Within functional limits Articulation: Within functional limitis Intelligibility: Intelligible Motor Planning: Witnin functional limits  Functional Assessment Tool Used: NOMS Functional Limitations: Spoken language expressive Spoken Language Expression Current Status [phone]): At least 20 percent but less than 40 percent impaired, limited or restricted Spoken Language Expression Goal Status [phone]): At least 1 percent but less than 20 percent impaired, limited or  restricted         ADAMS,PAT, M.S., CCC-SLP 03/23/2016, 1:11 PM

## 2016-03-23 NOTE — Progress Notes (Signed)
OT Cancellation Note  Patient Details Name: Hayley [name] MRN: [id number] DOB: [birth year]   Cancelled Treatment:    Reason Eval/Treat Not Completed: Patient declined, no reason specified. Pt requesting to eat breakfast prior to working with therapy. Will follow up for OT eval as time allows.  Binnie Kand M.S., OTR/L Pager: [phone]  03/23/2016, 9:09 AM

## 2016-03-23 NOTE — Progress Notes (Signed)
Pt discharged per orders from MD. Pt educated on discharge instructions. Pt verbalized understanding of instructions. All questions and concerns were addressed. Pt's IV was removed before discharge. Pt exited hospital via wheelchair.

## 2016-03-24 LAB — URINE CULTURE: Culture: >=100000 — AB

## 2016-03-24 LAB — HEMOGLOBIN A1C
Hgb A1c MFr Bld: 7.1 % — ABNORMAL HIGH (ref 4.8–5.6)
Mean Plasma Glucose: 157 mg/dL

## 2016-03-25 LAB — HIV ANTIBODY (ROUTINE TESTING W REFLEX): HIV Screen 4th Generation wRfx: NONREACTIVE

## 2016-03-26 LAB — FOLATE RBC
Folate, Hemolysate: 458.6 ng/mL
Folate, RBC: 1179 ng/mL (ref >498)
Hematocrit: 38.9 % (ref 34.0–46.6)

## 2016-03-27 DIAGNOSIS — Z471 Aftercare following joint replacement surgery: Secondary | ICD-10-CM | POA: Diagnosis not present

## 2016-03-27 DIAGNOSIS — F0630 Mood disorder due to known physiological condition, unspecified: Secondary | ICD-10-CM | POA: Diagnosis not present

## 2016-03-27 DIAGNOSIS — Z96651 Presence of right artificial knee joint: Secondary | ICD-10-CM | POA: Diagnosis not present

## 2016-03-27 DIAGNOSIS — Z96652 Presence of left artificial knee joint: Secondary | ICD-10-CM | POA: Diagnosis not present

## 2016-03-28 ENCOUNTER — Telehealth: Payer: Self-pay | Admitting: *Deleted

## 2016-03-28 ENCOUNTER — Encounter: Payer: Self-pay | Admitting: Neurology

## 2016-03-28 NOTE — Telephone Encounter (Signed)
Patient called at 3:10pm to cancel NCV/EMG (appt scheduled for 3pm).  Stated she has been in the hospital and got the time confused.

## 2016-04-02 ENCOUNTER — Other Ambulatory Visit (HOSPITAL_BASED_OUTPATIENT_CLINIC_OR_DEPARTMENT_OTHER): Payer: Medicare Other

## 2016-04-02 DIAGNOSIS — G8929 Other chronic pain: Secondary | ICD-10-CM | POA: Diagnosis not present

## 2016-04-02 DIAGNOSIS — S0990XS Unspecified injury of head, sequela: Secondary | ICD-10-CM

## 2016-04-02 DIAGNOSIS — I2609 Other pulmonary embolism with acute cor pulmonale: Secondary | ICD-10-CM | POA: Diagnosis not present

## 2016-04-02 DIAGNOSIS — M5440 Lumbago with sciatica, unspecified side: Secondary | ICD-10-CM | POA: Diagnosis not present

## 2016-04-02 DIAGNOSIS — D689 Coagulation defect, unspecified: Secondary | ICD-10-CM

## 2016-04-02 LAB — CBC WITH DIFFERENTIAL/PLATELET
BASO%: 0.7 % (ref 0.0–2.0)
Basophils Absolute: 0.1 10*3/uL (ref 0.0–0.1)
EOS%: 1.9 % (ref 0.0–7.0)
Eosinophils Absolute: 0.2 10*3/uL (ref 0.0–0.5)
HCT: 42.9 % (ref 34.8–46.6)
HGB: 13.7 g/dL (ref 11.6–15.9)
LYMPH%: 17.9 % (ref 14.0–49.7)
MCH: 26.8 pg (ref 25.1–34.0)
MCHC: 31.9 g/dL (ref 31.5–36.0)
MCV: 84.1 fL (ref 79.5–101.0)
MONO#: 0.5 10*3/uL (ref 0.1–0.9)
MONO%: 6 % (ref 0.0–14.0)
NEUT#: 6 10*3/uL (ref 1.5–6.5)
NEUT%: 73.5 % (ref 38.4–76.8)
Platelets: 314 10*3/uL (ref 145–400)
RBC: 5.1 10*6/uL (ref 3.70–5.45)
RDW: 17.2 % — ABNORMAL HIGH (ref 11.2–14.5)
WBC: 8.1 10*3/uL (ref 3.9–10.3)
lymph#: 1.5 10*3/uL (ref 0.9–3.3)

## 2016-04-02 LAB — COMPREHENSIVE METABOLIC PANEL
ALT: 13 U/L (ref 0–55)
AST: 15 U/L (ref 5–34)
Albumin: 3.6 g/dL (ref 3.5–5.0)
Alkaline Phosphatase: 98 U/L (ref 40–150)
Anion Gap: 10 mEq/L (ref 3–11)
BUN: 10.1 mg/dL (ref 7.0–26.0)
CO2: 28 mEq/L (ref 22–29)
Calcium: 9.3 mg/dL (ref 8.4–10.4)
Chloride: 103 mEq/L (ref 98–109)
Creatinine: 1 mg/dL (ref 0.6–1.1)
EGFR: 59 mL/min/{1.73_m2} — ABNORMAL LOW (ref >90)
Glucose: 163 mg/dl — ABNORMAL HIGH (ref 70–140)
Potassium: 4.6 mEq/L (ref 3.5–5.1)
Sodium: 141 mEq/L (ref 136–145)
Total Bilirubin: 0.32 mg/dL (ref 0.20–1.20)
Total Protein: 7.6 g/dL (ref 6.4–8.3)

## 2016-04-03 DIAGNOSIS — N390 Urinary tract infection, site not specified: Secondary | ICD-10-CM | POA: Diagnosis not present

## 2016-04-03 DIAGNOSIS — E538 Deficiency of other specified B group vitamins: Secondary | ICD-10-CM | POA: Diagnosis not present

## 2016-04-03 DIAGNOSIS — E669 Obesity, unspecified: Secondary | ICD-10-CM | POA: Diagnosis not present

## 2016-04-03 DIAGNOSIS — R300 Dysuria: Secondary | ICD-10-CM | POA: Diagnosis not present

## 2016-04-03 LAB — D-DIMER, QUANTITATIVE: D-DIMER: 0.72 mg/L FEU — ABNORMAL HIGH (ref 0.00–0.49)

## 2016-04-04 ENCOUNTER — Telehealth: Payer: Self-pay | Admitting: Neurology

## 2016-04-04 LAB — CARDIOLIPIN ANTIBODIES, IGG, IGM, IGA
Anticardiolipin Ab,IgA,Qn: <9 APL U/mL (ref 0–11)
Anticardiolipin Ab,IgG,Qn: <9 GPL U/mL (ref 0–14)
Anticardiolipin Ab,IgM,Qn: 29 MPL U/mL — ABNORMAL HIGH (ref 0–12)

## 2016-04-04 MED ORDER — SUMATRIPTAN SUCCINATE 25 MG PO TABS: 25.0000 mg | ORAL_TABLET | ORAL | Status: DC | PRN

## 2016-04-04 NOTE — Telephone Encounter (Signed)
Spoke to patient - she would actually like a new prescription for her sumatriptan 25mg  tablets sent in to [facility].  She only likes to use her injectables for her most severe migraine and would like the tablets to have for easier travel.

## 2016-04-04 NOTE — Telephone Encounter (Signed)
Patient is calling to get a new Rx for Maxalt called to [facility] in [place] in case she has a migraine.

## 2016-04-04 NOTE — Telephone Encounter (Signed)
Ok, per Dr. [name], to provide rx for sumatriptan 25mg , #10 per month.  New rx sent to [facility], per pt's request.

## 2016-04-08 NOTE — Progress Notes (Signed)
[place]  [phone] [phone]     ID: TASHEBA SELLARDS DOB: [birth year]  MR#: [id number]  [id number]  Patient Care Team: [name], FNP as PCP - General (Internal Medicine) Juanita Craver, MD as Consulting Physician (Gastroenterology) Daryll Brod, MD as Consulting Physician (Orthopedic Surgery) PCP: [name], FNP SU: Michael Boston MD OTHER MD: Gaynelle Arabian MD  CHIEF COMPLAINT: coagulopathy  CURRENT TREATMENT: Apixaban   HISTORY OF PRESENT ILLNESS: From the 06/05/2015 consult note:  "Ms Hofmann underwent laparoscopic appendectomy under Dr Johney [place] 11/27/2014 for what was likely a perforated appendix with phlegmon formation. On 12/06/2014 she was noted to be hypoxic and a chest CT/angio was obtained, showing multiple small bilateral PEs. LE dopplers that day were negative. UE dopplers the next day showed only a R cephalic v superficial thrombus.   The patient was started on warfarin, continued for one year. Repeat CT/angio 12/22/2013 showed resolution of the earlier clot.  On 12/06/2014 the patient underwent R TKR. On 12/09/2014 she was noted to be SOB and CT/angio documented bilateral PEs. She was started on Xarelto, later changed by her PCP to Pradaxa. Repeat CT angios 05/15/2014 and 06/03/2015 showed resolution of the earlier clots."  Her subsequent history is as detailed below  INTERVAL HISTORY: [name] returns today for follow-up of her history of a lateral pulmonary emboli. She had been on Pradaxa but stopped that about 2 months ago because of cost issues. More recently she was admitted because of symptoms suggestive of stroke. Fortunately the workup was negative. At the time of discharge she was started on apixaban 5 mg twice daily. She is tolerating that well at this point and was able to obtain it at least initially had a very good price  REVIEW OF SYSTEMS: [name] continues to have disabling back pain and this limits her activity.  Currently she denies unusual headaches, visual changes, or dizziness. She has had numerous falls however she recently bruised her right lower leg as a result. She has not had any bleeds or other complications from her blood thinners. A detailed review of systems today was otherwise stable  PAST MEDICAL HISTORY: Past Medical History:  Diagnosis Date  . Anemia   . Anxiety   . Bronchitis    hx of   . Clotting disorder ([place]) 02-17-14   S/p appendectomy-developed Pulmonary emboli-tx. warfarin-d/c 1 yr ago.  . Colon polyps   . Depression   . Diabetes mellitus without complication ([place])    "borderline"- oral med  . Disorder of vocal cords    spasmotic dysphonia ,02-17-14 has" whispery voice-low tone"  . Hyperthyroidism   . Kidney stones   . Migraines   . Nausea Nov 15, 2014   cycle of migraine headaches and nausea  . Neuromuscular disorder ([place])    hands and throat"spasmodic dysphonia", tremors - thimb and forefinger   . Obesity, Class III, BMI 40-49.9 (morbid obesity) ([place]) 04/27/2010   02-17-14 reports some weight loss- intentional  . Osteoarthritis    in back(chronic pain)  . Pelvic pain    02-17-14 "states thinks its back related on left groin"  . Pulmonary emboli [facility])    s/p Appendectomy '13 [facility]  . Reflux   . Right knee pain   . Sleep apnea    cpap - settings at 4   . Toxic goiter     PAST SURGICAL HISTORY: Past Surgical History:  Procedure Laterality Date  . ABDOMINAL HYSTERECTOMY    . APPENDECTOMY  [id number] [facility] s/p developed  Pulmonary Emboli  . BOTOX INJECTION     for migranes  . CATARACT EXTRACTION, BILATERAL Bilateral 03/2013   . CHOLECYSTECTOMY    . HAND SURGERY  2004   both hands  . KIDNEY STONE SURGERY    . KNEE ARTHROSCOPY  1998   right  . LAPAROSCOPIC APPENDECTOMY  11/27/2011   Procedure: APPENDECTOMY LAPAROSCOPIC;  Surgeon: Adin Hector, MD;  Location: [facility];  Service: General;  Laterality: N/A;  . TOTAL KNEE ARTHROPLASTY Right 12/06/2014   Procedure:  RIGHT TOTAL KNEE ARTHROPLASTY;  Surgeon: Gaynelle Arabian, MD;  Location: [facility];  Service: Orthopedics;  Laterality: Right;  . TOTAL KNEE REVISION Left 02/24/2014   Procedure: LEFT TOTAL KNEE REVISION;  Surgeon: Gearlean Alf, MD;  Location: [facility];  Service: Orthopedics;  Laterality: Left;  . vocal cord surgery  02-17-14   01-12-14 done in [place] [facility](s/p selective denervation/reinnervation recurrent laryngeal nerve surgery)  . zenkers diverticulum      FAMILY HISTORY Family History  Problem Relation Age of Onset  . Heart disease Mother   . Pneumonia Father   . Cirrhosis Father   . Hypertension Father   . Heart attack Paternal Grandfather   . Heart attack Maternal Grandfather   . Heart attack Paternal Grandmother   . Heart attack Maternal Grandmother   . Alcoholism Father     GYNECOLOGIC HISTORY:  No LMP recorded. Patient has had a hysterectomy.  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 The patient has 2 children from an earlier marriage, Corene Cornea, who lives in [place], and a daughter [name], who manages a Building surveyor. The patient has one grandchild of her own, 47 with Arnell Sieving (who has 3 children from a prior marriage)     HEALTH MAINTENANCE: Social History  Substance Use Topics  . Smoking status: Former Smoker    Quit date: 01/03/1982  . Smokeless tobacco: Never Used  . Alcohol use No     Colonoscopy:  PAP:  Bone density:  Lipid panel:  Allergies  Allergen Reactions  . Tape Rash    Rash with paper tape  . Bee Venom     unknown  . Etodolac Nausea And Vomiting  . Morphine Other (See Comments)    Hallucinations  . Other Itching and Swelling  . Poison Ivy Extract  [Extract Of Poison Ivy] Itching and Swelling  . Propoxyphene   . Propoxyphene N-Acetaminophen Nausea And Vomiting  . Penicillins Rash         Current Outpatient Prescriptions  Medication  Sig Dispense Refill  . albuterol (PROVENTIL HFA;VENTOLIN HFA) 108 (90 BASE) MCG/ACT inhaler Inhale 1 puff into the lungs every 6 (six) hours as needed for wheezing or shortness of breath.    [name] albuterol (PROVENTIL) (2.5 MG/3ML) 0.083% nebulizer solution Take 2.5 mg by nebulization every 6 (six) hours as needed for wheezing or shortness of breath. Reported on 01/10/2016    . apixaban (ELIQUIS) 5 MG TABS tablet Take 1 tablet (5 mg total) by mouth 2 (two) times daily. X 7 days, then 1 tablet (5 mg) two times daily starting 03/31/16 180 tablet 3  . benzonatate (TESSALON) 100 MG capsule Takes as needed  1  . buPROPion (WELLBUTRIN XL) 150 MG 24 hr tablet Take 150 mg by mouth every morning.  0  . ciprofloxacin (CIPRO) 500 MG tablet Take 1 tablet (  500 mg total) by mouth 2 (two) times daily. 10 tablet 0  . clonazePAM (KLONOPIN) 0.5 MG tablet Take 1 tablet (0.5 mg total) by mouth at bedtime. 30 tablet 0  . cyclobenzaprine (FLEXERIL) 10 MG tablet Take 1 tablet (10 mg total) by mouth at bedtime. 90 tablet 4  . DULoxetine (CYMBALTA) 60 MG capsule Take 1 capsule (60 mg total) by mouth every morning. 90 capsule 4  . fluticasone (FLONASE) 50 MCG/ACT nasal spray Place 1 spray into both nostrils daily as needed for allergies or rhinitis.    [name] gabapentin (NEURONTIN) 800 MG tablet 800mg  in the morning and 1600mg  at pm 90 tablet 6  . levothyroxine (SYNTHROID, LEVOTHROID) 25 MCG tablet Take 25 mcg by mouth every morning.     . mometasone-formoterol (DULERA) 100-5 MCG/ACT AERO Inhale 2 puffs into the lungs 2 (two) times daily.    . ondansetron (ZOFRAN) 4 MG tablet Take 4 mg by mouth every 8 (eight) hours as needed for nausea or vomiting.    . Probiotic Product (ACIDOPHILUS/GOAT MILK) CAPS Take by mouth daily.    . propranolol (INDERAL) 60 MG tablet Take 1 tablet (60 mg total) by mouth 2 (two) times daily. 180 tablet 3  . sitaGLIPtin (JANUVIA) 100 MG tablet Take 100 mg by mouth every evening. Reported on 01/10/2016    .  SUMAtriptan (IMITREX) 25 MG tablet Take 1 tablet (25 mg total) by mouth every 2 (two) hours as needed for migraine. May repeat in 2 hours if headache persists or recurs. 30 tablet 3  . SUMAtriptan Succinate Refill 4 MG/0.5ML SOCT Inject 4 mg into the skin as needed. (Patient taking differently: Inject 4 mg into the skin as needed (migraines). ) 5 mL 6  . valACYclovir (VALTREX) 1000 MG tablet Take 2,000 mg by mouth 2 (two) times daily as needed (Fever blisters). Reported on 01/10/2016  2  . vitamin B-12 500 MCG tablet Take 1 tablet (500 mcg total) by mouth daily. 30 tablet 0   No current facility-administered medications for this visit.     OBJECTIVE: middle-aged White woman  Vitals:   04/09/16 1336  BP: 107/65  Pulse: 72  Resp: 18  Temp: 98.5 F (36.9 C)     Body mass index is 37.98 kg/m.    ECOG FS:2 - Symptomatic, <50% confined to bed  Sclerae unicteric, pupils round and equal Oropharynx clear and moist-- no thrush or other lesions No cervical or supraclavicular adenopathy Lungs no rales or rhonchi Heart regular rate and rhythm Abd soft, nontender, positive bowel sounds MSK Significant tenderness over the lower spine to moderate palpation Neuro: nonfocal, well oriented, appropriate affect Breasts: Deferred    LAB RESULTS: Results for ESMARIE, PADRICK (MRN [id number]) as of 07/20/2015 17:57  Ref. Range 12/05/2011 20:54 12/21/2013 21:58 06/04/2015 15:56 07/18/2015 10:42  D-Dimer, America Brown Latest Ref Range: 0.00-0.48 ug/mL-FEU 9.93 (H) 1.14 (H) 0.57 (H) 0.93 (H)   CMP     Component Value Date/Time   NA 141 04/02/2016 1339   K 4.6 04/02/2016 1339   CL 101 03/22/2016 1303   CO2 28 04/02/2016 1339   GLUCOSE 163 (H) 04/02/2016 1339   BUN 10.1 04/02/2016 1339   CREATININE 1.0 04/02/2016 1339   CALCIUM 9.3 04/02/2016 1339   PROT 7.6 04/02/2016 1339   ALBUMIN 3.6 04/02/2016 1339   AST 15 04/02/2016 1339   ALT 13 04/02/2016 1339   ALKPHOS 98 04/02/2016 1339   BILITOT 0.32 04/02/2016  1339   GFRNONAA >60 03/22/2016 1258  GFRAA >60 03/22/2016 1258    INo results found for: SPEP, UPEP  Lab Results  Component Value Date   WBC 8.1 04/02/2016   NEUTROABS 6.0 04/02/2016   HGB 13.7 04/02/2016   HCT 42.9 04/02/2016   MCV 84.1 04/02/2016   PLT 314 04/02/2016      Chemistry      Component Value Date/Time   NA 141 04/02/2016 1339   K 4.6 04/02/2016 1339   CL 101 03/22/2016 1303   CO2 28 04/02/2016 1339   BUN 10.1 04/02/2016 1339   CREATININE 1.0 04/02/2016 1339      Component Value Date/Time   CALCIUM 9.3 04/02/2016 1339   ALKPHOS 98 04/02/2016 1339   AST 15 04/02/2016 1339   ALT 13 04/02/2016 1339   BILITOT 0.32 04/02/2016 1339       No results found for: LABCA2  No components found for: LABCA125  No results for input(s): INR in the last 168 hours.  Urinalysis    Component Value Date/Time   COLORURINE YELLOW 03/22/2016 1530   APPEARANCEUR CLOUDY (A) 03/22/2016 1530   APPEARANCEUR CLOUDY 12/19/2014 0730   LABSPEC 1.007 03/22/2016 1530   LABSPEC 1.025 12/19/2014 0730   PHURINE 6.5 03/22/2016 1530   GLUCOSEU NEGATIVE 03/22/2016 1530   GLUCOSEU see comment 12/19/2014 0730   HGBUR TRACE (A) 03/22/2016 1530   BILIRUBINUR NEGATIVE 03/22/2016 1530   BILIRUBINUR see comment 12/19/2014 0730   KETONESUR NEGATIVE 03/22/2016 1530   PROTEINUR NEGATIVE 03/22/2016 1530   UROBILINOGEN 1.0 11/30/2014 1215   NITRITE POSITIVE (A) 03/22/2016 1530   LEUKOCYTESUR LARGE (A) 03/22/2016 1530   LEUKOCYTESUR see comment 12/19/2014 0730  Results for RENISE, RIBBLE (MRN [id number]) as of 04/08/2016 15:06  Ref. Range 10/24/2015 10:00 01/10/2016 13:47 02/10/2016 11:43 03/12/2016 15:13 04/02/2016 13:39  D-dimer Latest Ref Range: 0.00 - 0.49 mg/L FEU 1.02 (H) 0.48 0.68 (H) 0.55 (H) 0.72 (H)    STUDIES: Mr Virgel Paling [id number] Contrast  Result Date: 03/22/2016 CLINICAL DATA:  70 year old diabetic hypertensive female with right-sided weakness. Subsequent encounter. EXAM: MRI HEAD WITHOUT  CONTRAST MRA HEAD WITHOUT CONTRAST TECHNIQUE: Multiplanar, multiecho pulse sequences of the brain and surrounding structures were obtained without intravenous contrast. Angiographic images of the head were obtained using MRA technique without contrast. COMPARISON:  03/22/2016 CT.  No comparison brain MR. FINDINGS: MRI HEAD FINDINGS No acute infarct or intracranial hemorrhage. Very mild chronic microvascular changes. Remote tiny right cerebellar infarct. Mild global atrophy without hydrocephalus. No intracranial mass lesion noted on this unenhanced exam. Cervical medullary junction unremarkable. Post lens replacement without acute orbital abnormality. MRA HEAD FINDINGS Anterior circulation without medium or large size vessel significant stenosis or occlusion. Ectatic cavernous segment internal carotid artery with ectasia more notable on the left. Ectatic distal vertical cervical segment of the right internal carotid artery. Mild middle cerebral artery branch vessel irregularity bilaterally. Ectatic vertebral arteries without significant narrowing. Fenestrated appearance proximal basilar artery. Nonvisualized posterior inferior cerebellar arteries. Mild narrowing distal basilar artery. Slightly prominent basilar tip without aneurysm noted. Mild narrowing posterior cerebral artery distal branches. IMPRESSION: MRI HEAD No acute infarct or intracranial hemorrhage. Very mild chronic microvascular changes. Remote tiny right cerebellar infarct. Mild global atrophy without hydrocephalus. No intracranial mass lesion noted on this unenhanced exam. MRA HEAD Anterior circulation without medium or large size vessel significant stenosis or occlusion. Ectatic cavernous segment internal carotid artery with ectasia more notable on the left. Ectatic distal vertical cervical segment of the right internal carotid artery. Mild middle cerebral  artery branch vessel irregularity bilaterally. Ectatic vertebral arteries without significant  narrowing. Nonvisualized posterior inferior cerebellar arteries. Mild narrowing distal basilar artery. Slightly prominent basilar tip without aneurysm noted. Electronically Signed   By: Genia Del M.D.   On: 03/22/2016 18:26   Mr Brain Wo Contrast  Result Date: 03/22/2016 CLINICAL DATA:  70 year old diabetic hypertensive female with right-sided weakness. Subsequent encounter. EXAM: MRI HEAD WITHOUT CONTRAST MRA HEAD WITHOUT CONTRAST TECHNIQUE: Multiplanar, multiecho pulse sequences of the brain and surrounding structures were obtained without intravenous contrast. Angiographic images of the head were obtained using MRA technique without contrast. COMPARISON:  03/22/2016 CT.  No comparison brain MR. FINDINGS: MRI HEAD FINDINGS No acute infarct or intracranial hemorrhage. Very mild chronic microvascular changes. Remote tiny right cerebellar infarct. Mild global atrophy without hydrocephalus. No intracranial mass lesion noted on this unenhanced exam. Cervical medullary junction unremarkable. Post lens replacement without acute orbital abnormality. MRA HEAD FINDINGS Anterior circulation without medium or large size vessel significant stenosis or occlusion. Ectatic cavernous segment internal carotid artery with ectasia more notable on the left. Ectatic distal vertical cervical segment of the right internal carotid artery. Mild middle cerebral artery branch vessel irregularity bilaterally. Ectatic vertebral arteries without significant narrowing. Fenestrated appearance proximal basilar artery. Nonvisualized posterior inferior cerebellar arteries. Mild narrowing distal basilar artery. Slightly prominent basilar tip without aneurysm noted. Mild narrowing posterior cerebral artery distal branches. IMPRESSION: MRI HEAD No acute infarct or intracranial hemorrhage. Very mild chronic microvascular changes. Remote tiny right cerebellar infarct. Mild global atrophy without hydrocephalus. No intracranial mass lesion noted on  this unenhanced exam. MRA HEAD Anterior circulation without medium or large size vessel significant stenosis or occlusion. Ectatic cavernous segment internal carotid artery with ectasia more notable on the left. Ectatic distal vertical cervical segment of the right internal carotid artery. Mild middle cerebral artery branch vessel irregularity bilaterally. Ectatic vertebral arteries without significant narrowing. Nonvisualized posterior inferior cerebellar arteries. Mild narrowing distal basilar artery. Slightly prominent basilar tip without aneurysm noted. Electronically Signed   By: Genia Del M.D.   On: 03/22/2016 18:26   Ct Head Code Stroke W/o Cm  Result Date: 03/22/2016 CLINICAL DATA:  Code stroke, right-sided weakness EXAM: CT HEAD WITHOUT CONTRAST TECHNIQUE: Contiguous axial images were obtained from the base of the skull through the vertex without intravenous contrast. COMPARISON:  08/16/2015 FINDINGS: No skull fracture is noted. Paranasal sinuses and mastoid air cells are unremarkable. Mild cerebral atrophy is noted. No intracranial hemorrhage, mass effect or midline shift. Mild periventricular white matter decreased attenuation probable due to chronic small vessel ischemic changes. No definite acute cortical infarction. No mass lesion is noted on this unenhanced scan. Atherosclerotic calcifications of carotid siphon. IMPRESSION: 1. No acute intracranial abnormality. Mild cerebral atrophy. Mild periventricular white matter decreased attenuation probable due to chronic small vessel ischemic changes. No definite acute cortical infarction. Electronically Signed   By: Lahoma Crocker M.D.   On: 03/22/2016 13:05    ASSESSMENT: 70 y.o. [place] woman with a history of recurrent PE as follows:  (1) multiple low-volume PE noted on chest CT/angio 12/06/2014, 10 days after laparoscopic appendectomy for a perforated appendix with phlegmon (a) bilateral LE dopplers 12/06/2014  negative (b) bilateral UE dopplers 12/07/2014 showed superficial R cephalic v clot (c) treatment: warfarin x one year, monitored through [place] (d) repeat CT angio 12/23/2015 showed prior PEs cleared; also CT/angio 05/15/2014 negative  (2) bilateral PEs documented by Ct/angio03/24/2016, day 3 post R total knee (a) started on rivaroxaban/ Xarelto, switched  by PCP to dabigratan/ Pradaxa (b) repeat CT/angio 06/03/2015 shows resolution of prior PE  (3) hypercoagulable panel 06/05/2015 including a homocystine level, a screen for the lupus anticoagulant, beta 2 glycoprotein studies, anti-cardiolipin antibodies, and studies for the factor V Leiden and prothrombin gene mutation found no actionable abnormalities  (a) the anticardiolipin IgM antibody 06/05/2015 was 34 ("low medium positive"), repeat 07/18/2015 was 38  PLAN: [name]'s case is complex. She is continuing risk of clotting. She is obese and inactive. She has a history of diabetes. She has a persistently elevated although only mildly elevated d-dimer. Given all this we are continuing the apixaban.  On the other hand she has had several falls and is at risk of head injury  After much discussion trying to balance these imponderables we decided to continue the apixaban another 3 months and then consider stopping or dropping the dose to 2.5 mg twice daily.  Part of the issue is cost. She is now on his second very expensive medicine. This is the reason she stopped the Pradaxa earlier. She is going to try to obtain this through a less expensive tender.  She will see me again in 3 months. We will recheck lab work before that visit. She knows to call for any problems that may develop before then.    Chauncey Cruel, MD   04/09/2016 1:44 PM

## 2016-04-09 ENCOUNTER — Telehealth: Payer: Self-pay | Admitting: Oncology

## 2016-04-09 ENCOUNTER — Ambulatory Visit (HOSPITAL_BASED_OUTPATIENT_CLINIC_OR_DEPARTMENT_OTHER): Payer: Medicare Other | Admitting: Oncology

## 2016-04-09 VITALS — BP 107/65 | HR 72 | Temp 98.5°F | Resp 18 | Ht 66.5 in | Wt 238.9 lb

## 2016-04-09 DIAGNOSIS — E119 Type 2 diabetes mellitus without complications: Secondary | ICD-10-CM

## 2016-04-09 DIAGNOSIS — I2699 Other pulmonary embolism without acute cor pulmonale: Secondary | ICD-10-CM | POA: Diagnosis not present

## 2016-04-09 DIAGNOSIS — E559 Vitamin D deficiency, unspecified: Secondary | ICD-10-CM | POA: Diagnosis not present

## 2016-04-09 DIAGNOSIS — I10 Essential (primary) hypertension: Secondary | ICD-10-CM | POA: Diagnosis not present

## 2016-04-09 DIAGNOSIS — Z86711 Personal history of pulmonary embolism: Secondary | ICD-10-CM | POA: Diagnosis not present

## 2016-04-09 DIAGNOSIS — L918 Other hypertrophic disorders of the skin: Secondary | ICD-10-CM | POA: Diagnosis not present

## 2016-04-09 DIAGNOSIS — E039 Hypothyroidism, unspecified: Secondary | ICD-10-CM | POA: Diagnosis not present

## 2016-04-09 DIAGNOSIS — E669 Obesity, unspecified: Secondary | ICD-10-CM | POA: Insufficient documentation

## 2016-04-09 DIAGNOSIS — Z1212 Encounter for screening for malignant neoplasm of rectum: Secondary | ICD-10-CM | POA: Diagnosis not present

## 2016-04-09 DIAGNOSIS — D689 Coagulation defect, unspecified: Secondary | ICD-10-CM

## 2016-04-09 DIAGNOSIS — Z0000 Encounter for general adult medical examination without abnormal findings: Secondary | ICD-10-CM | POA: Diagnosis not present

## 2016-04-09 MED ORDER — APIXABAN 5 MG PO TABS: 5.0000 mg | ORAL_TABLET | Freq: Two times a day (BID) | ORAL | 3 refills | Status: DC

## 2016-04-09 NOTE — Telephone Encounter (Signed)
Appt made and avs printed °

## 2016-04-10 ENCOUNTER — Other Ambulatory Visit: Payer: Self-pay | Admitting: Oncology

## 2016-04-17 DIAGNOSIS — E119 Type 2 diabetes mellitus without complications: Secondary | ICD-10-CM | POA: Diagnosis not present

## 2016-04-20 DIAGNOSIS — M5442 Lumbago with sciatica, left side: Secondary | ICD-10-CM | POA: Diagnosis not present

## 2016-04-23 ENCOUNTER — Inpatient Hospital Stay (HOSPITAL_COMMUNITY)
Admission: AD | Admit: 2016-04-23 | Discharge: 2016-04-23 | Disposition: A | Discharge status: Home / Self Care | Payer: Medicare Other | Source: Ambulatory Visit | Attending: Obstetrics & Gynecology | Admitting: Obstetrics & Gynecology

## 2016-04-23 ENCOUNTER — Encounter (HOSPITAL_COMMUNITY): Payer: Self-pay

## 2016-04-23 DIAGNOSIS — Z9049 Acquired absence of other specified parts of digestive tract: Secondary | ICD-10-CM | POA: Diagnosis not present

## 2016-04-23 DIAGNOSIS — Z87442 Personal history of urinary calculi: Secondary | ICD-10-CM | POA: Insufficient documentation

## 2016-04-23 DIAGNOSIS — R102 Pelvic and perineal pain: Secondary | ICD-10-CM | POA: Diagnosis not present

## 2016-04-23 DIAGNOSIS — Z86010 Personal history of colonic polyps: Secondary | ICD-10-CM | POA: Insufficient documentation

## 2016-04-23 DIAGNOSIS — N760 Acute vaginitis: Secondary | ICD-10-CM | POA: Insufficient documentation

## 2016-04-23 DIAGNOSIS — A499 Bacterial infection, unspecified: Secondary | ICD-10-CM

## 2016-04-23 DIAGNOSIS — Z888 Allergy status to other drugs, medicaments and biological substances status: Secondary | ICD-10-CM | POA: Diagnosis not present

## 2016-04-23 DIAGNOSIS — F418 Other specified anxiety disorders: Secondary | ICD-10-CM | POA: Diagnosis not present

## 2016-04-23 DIAGNOSIS — Z96653 Presence of artificial knee joint, bilateral: Secondary | ICD-10-CM | POA: Diagnosis not present

## 2016-04-23 DIAGNOSIS — R490 Dysphonia: Secondary | ICD-10-CM | POA: Insufficient documentation

## 2016-04-23 DIAGNOSIS — Z8249 Family history of ischemic heart disease and other diseases of the circulatory system: Secondary | ICD-10-CM | POA: Insufficient documentation

## 2016-04-23 DIAGNOSIS — Z7982 Long term (current) use of aspirin: Secondary | ICD-10-CM | POA: Insufficient documentation

## 2016-04-23 DIAGNOSIS — Z880 Allergy status to penicillin: Secondary | ICD-10-CM | POA: Insufficient documentation

## 2016-04-23 DIAGNOSIS — Z91030 Bee allergy status: Secondary | ICD-10-CM | POA: Insufficient documentation

## 2016-04-23 DIAGNOSIS — Z86711 Personal history of pulmonary embolism: Secondary | ICD-10-CM | POA: Insufficient documentation

## 2016-04-23 DIAGNOSIS — Z811 Family history of alcohol abuse and dependence: Secondary | ICD-10-CM | POA: Insufficient documentation

## 2016-04-23 DIAGNOSIS — E0500 Thyrotoxicosis with diffuse goiter without thyrotoxic crisis or storm: Secondary | ICD-10-CM | POA: Insufficient documentation

## 2016-04-23 DIAGNOSIS — Z885 Allergy status to narcotic agent status: Secondary | ICD-10-CM | POA: Insufficient documentation

## 2016-04-23 DIAGNOSIS — Z87891 Personal history of nicotine dependence: Secondary | ICD-10-CM | POA: Insufficient documentation

## 2016-04-23 DIAGNOSIS — N898 Other specified noninflammatory disorders of vagina: Secondary | ICD-10-CM | POA: Insufficient documentation

## 2016-04-23 DIAGNOSIS — G4730 Sleep apnea, unspecified: Secondary | ICD-10-CM | POA: Insufficient documentation

## 2016-04-23 DIAGNOSIS — E119 Type 2 diabetes mellitus without complications: Secondary | ICD-10-CM | POA: Insufficient documentation

## 2016-04-23 DIAGNOSIS — M1990 Unspecified osteoarthritis, unspecified site: Secondary | ICD-10-CM | POA: Insufficient documentation

## 2016-04-23 DIAGNOSIS — B9689 Other specified bacterial agents as the cause of diseases classified elsewhere: Secondary | ICD-10-CM

## 2016-04-23 DIAGNOSIS — Z7902 Long term (current) use of antithrombotics/antiplatelets: Secondary | ICD-10-CM | POA: Insufficient documentation

## 2016-04-23 DIAGNOSIS — Z9109 Other allergy status, other than to drugs and biological substances: Secondary | ICD-10-CM | POA: Diagnosis not present

## 2016-04-23 DIAGNOSIS — D689 Coagulation defect, unspecified: Secondary | ICD-10-CM | POA: Diagnosis not present

## 2016-04-23 DIAGNOSIS — K219 Gastro-esophageal reflux disease without esophagitis: Secondary | ICD-10-CM | POA: Insufficient documentation

## 2016-04-23 DIAGNOSIS — Z7901 Long term (current) use of anticoagulants: Secondary | ICD-10-CM | POA: Diagnosis not present

## 2016-04-23 DIAGNOSIS — N949 Unspecified condition associated with female genital organs and menstrual cycle: Secondary | ICD-10-CM | POA: Diagnosis not present

## 2016-04-23 DIAGNOSIS — Z90710 Acquired absence of both cervix and uterus: Secondary | ICD-10-CM | POA: Insufficient documentation

## 2016-04-23 LAB — WET PREP, GENITAL
Sperm: NONE SEEN
Trich, Wet Prep: NONE SEEN
Yeast Wet Prep HPF POC: NONE SEEN

## 2016-04-23 MED ORDER — METRONIDAZOLE 500 MG PO TABS: 500.0000 mg | ORAL_TABLET | Freq: Two times a day (BID) | ORAL | 0 refills | Status: DC

## 2016-04-23 NOTE — Discharge Instructions (Signed)
How to Take a Sitz Bath  A sitz bath is a warm water bath that is taken while you are sitting down. The water should only come up to your hips and should cover your buttocks. Your health care provider may recommend a sitz bath to help you:   · Clean the lower part of your body, including your genital area.  · With itching.  · With pain.  · With sore muscles or muscles that tighten or spasm.  HOW TO TAKE A SITZ BATH  Take 3-4 sitz baths per day or as told by your health care provider.  1. Partially fill a bathtub with warm water. You will only need the water to be deep enough to cover your hips and buttocks when you are sitting in it.  2. If your health care provider told you to put medicine in the water, follow the directions exactly.  3. Sit in the water and open the tub drain a little.  4. Turn on the warm water again to keep the tub at the correct level. Keep the water running constantly.  5. Soak in the water for 15-20 minutes or as told by your health care provider.  6. After the sitz bath, pat the affected area dry first. Do not rub it.  7. Be careful when you stand up after the sitz bath because you may feel dizzy.  SEEK MEDICAL CARE IF:  · Your symptoms get worse. Do not continue with sitz baths if your symptoms get worse.  · You have new symptoms. Do not continue with sitz baths until you talk with your health care provider.     This information is not intended to replace advice given to you by your health care provider. Make sure you discuss any questions you have with your health care provider.     Document Released: 05/26/2004 Document Revised: 01/18/2015 Document Reviewed: 09/01/2014  Elsevier Interactive Patient Education ©2016 [employer]

## 2016-04-23 NOTE — MAU Note (Signed)
Her Dr at [facility] sent her over.  Has a know on the inside of her left labia. First noted about a wk ago.  Gotten bigger and sorer.

## 2016-04-23 NOTE — MAU Provider Note (Signed)
History     CSN: [id number]  Arrival date and time: 04/23/16 1302   First Provider Initiated Contact with Patient 04/23/16 1658      Chief Complaint  Patient presents with  . Vaginal Pain  . vag abscess   HPI   [name] is a 70 y.o. female with a significant past medical history, including a history of abdominal hysterectomy is here today with vaginal discomfort.  She was seen by her PCP today for an A1C checked and the patient mentioned that she had a vaginal abscess. It was recommended by her PCP that she be evaluated by GYN. The knot is located on the the patient's left labia. She first noticed it a couple of weeks ago. The area is painful. The area bled a few days ago and has not bled since. She does not shave in her vaginal area. She has never had this before.   On Eliquis for history of PE; followed by PCP   OB History    Gravida Para Term Preterm AB Living   6       4 2    SAB TAB Ectopic Multiple Live Births   4              Past Medical History:  Diagnosis Date  . Anemia   . Anxiety   . Bronchitis    hx of   . Clotting disorder ([place]) 02-17-14   S/p appendectomy-developed Pulmonary emboli-tx. warfarin-d/c 1 yr ago.  . Colon polyps   . Depression   . Diabetes mellitus without complication ([place])    "borderline"- oral med  . Disorder of vocal cords    spasmotic dysphonia ,02-17-14 has" whispery voice-low tone"  . Hyperthyroidism   . Kidney stones   . Migraines   . Nausea Nov 15, 2014   cycle of migraine headaches and nausea  . Neuromuscular disorder ([place])    hands and throat"spasmodic dysphonia", tremors - thimb and forefinger   . Obesity, Class III, BMI 40-49.9 (morbid obesity) ([place]) 04/27/2010   02-17-14 reports some weight loss- intentional  . Osteoarthritis    in back(chronic pain)  . Pelvic pain    02-17-14 "states thinks its back related on left groin"  . Pulmonary emboli [facility])    s/p Appendectomy '13 [facility]  . Reflux   . Right knee pain   . Sleep  apnea    cpap - settings at 4   . Toxic goiter     Past Surgical History:  Procedure Laterality Date  . ABDOMINAL HYSTERECTOMY    . APPENDECTOMY     3'13 [facility] s/p developed  Pulmonary Emboli  . BOTOX INJECTION     for migranes  . CATARACT EXTRACTION, BILATERAL Bilateral 03/2013   . CHOLECYSTECTOMY    . HAND SURGERY  2004   both hands  . KIDNEY STONE SURGERY    . KNEE ARTHROSCOPY  1998   right  . LAPAROSCOPIC APPENDECTOMY  11/27/2011   Procedure: APPENDECTOMY LAPAROSCOPIC;  Surgeon: Adin Hector, MD;  Location: [facility];  Service: General;  Laterality: N/A;  . TOTAL KNEE ARTHROPLASTY Right 12/06/2014   Procedure: RIGHT TOTAL KNEE ARTHROPLASTY;  Surgeon: Gaynelle Arabian, MD;  Location: [facility];  Service: Orthopedics;  Laterality: Right;  . TOTAL KNEE REVISION Left 02/24/2014   Procedure: LEFT TOTAL KNEE REVISION;  Surgeon: Gearlean Alf, MD;  [facility]: [facility];  Service: Orthopedics;  Laterality: Left;  . vocal cord surgery  02-17-14   01-12-14 done in  [place] [facility](s/p selective denervation/reinnervation recurrent laryngeal nerve surgery)  . zenkers diverticulum      Family History  Problem Relation Age of Onset  . Heart disease Mother   . Pneumonia Father   . Cirrhosis Father   . Hypertension Father   . Alcoholism Father   . Heart attack Paternal Grandfather   . Heart attack Maternal Grandfather   . Heart attack Paternal Grandmother   . Heart attack Maternal Grandmother     Social History  Substance Use Topics  . Smoking status: Former Smoker    Quit date: 01/03/1982  . Smokeless tobacco: Never Used  . Alcohol use No    Allergies:  Allergies  Allergen Reactions  . Bee Venom Anaphylaxis  . Tape Rash    Rash with paper tape  . Etodolac Diarrhea and Nausea And Vomiting  . Morphine Other (See Comments)    Hallucinations  . Other Itching and Swelling  . Poison Ivy Extract  [Extract Of Poison Ivy] Itching and Swelling  . Propoxyphene Nausea And Vomiting  .  Propoxyphene N-Acetaminophen Nausea And Vomiting  . Penicillins Rash    Has patient had a PCN reaction causing immediate rash, facial/tongue/throat swelling, SOB or lightheadedness with hypotension: Yes Has patient had a PCN reaction causing severe rash involving mucus membranes or skin necrosis: No Has patient had a PCN reaction that required hospitalization No Has patient had a PCN reaction occurring within the last 10 years: No If all of the above answers are "NO", then may proceed with Cephalosporin use.     Prescriptions Prior to Admission  Medication Sig Dispense Refill Last Dose  . albuterol (PROVENTIL HFA;VENTOLIN HFA) 108 (90 BASE) MCG/ACT inhaler Inhale 1 puff into the lungs every 6 (six) hours as needed for wheezing or shortness of breath.   Rescue at Unknown time  . apixaban (ELIQUIS) 5 MG TABS tablet Take 1 tablet (5 mg total) by mouth 2 (two) times daily. X 7 days, then 1 tablet (5 mg) two times daily starting 03/31/16 180 tablet 3 04/22/2016 at Unknown time  . aspirin EC 81 MG tablet Take 81 mg by mouth daily.   Past Month at Unknown time  . benzonatate (TESSALON) 100 MG capsule Take 100 mg by mouth 2 (two) times daily as needed for cough.   Past Month at Unknown time  . BIOTIN PO Take 1 capsule by mouth daily.   04/22/2016 at Unknown time  . buPROPion (WELLBUTRIN XL) 150 MG 24 hr tablet Take 150 mg by mouth every morning.  0 04/22/2016 at Unknown time  . CHERATUSSIN AC 100-10 MG/5ML syrup Take 5-10 mLs by mouth every 8 (eight) hours as needed for cough.  1 Past Week at Unknown time  . cholecalciferol (VITAMIN D) 1000 units tablet Take 1,000 Units by mouth daily.   04/22/2016 at Unknown time  . clonazePAM (KLONOPIN) 0.5 MG tablet Take 1 tablet (0.5 mg total) by mouth at bedtime. 30 tablet 0 04/22/2016 at Unknown time  . cyanocobalamin (,VITAMIN B-12,) 1000 MCG/ML injection Inject 1 mL into the skin every 30 (thirty) days.  11 Past Month at Unknown time  . cyclobenzaprine (FLEXERIL) 10 MG  tablet Take 1 tablet (10 mg total) by mouth at bedtime. 90 tablet 4 04/22/2016 at Unknown time  . diphenhydrAMINE (BENADRYL) 25 MG tablet Take 50 mg by mouth every 6 (six) hours as needed for itching.   04/22/2016 at Unknown time  . DULoxetine (CYMBALTA) 60 MG capsule Take 1 capsule (60 mg total)  by mouth every morning. 90 capsule 4 04/22/2016 at Unknown time  . gabapentin (NEURONTIN) 800 MG tablet 800mg  in the morning and 1600mg  at pm (Patient taking differently: Take 800-1,600 mg by mouth 2 (two) times daily. 800mg  in the morning and 1600mg  at pm) 90 tablet 6 04/22/2016 at Unknown time  . ibuprofen (ADVIL,MOTRIN) 200 MG tablet Take 800 mg by mouth every 8 (eight) hours as needed for mild pain.   04/22/2016 at Unknown time  . levothyroxine (SYNTHROID, LEVOTHROID) 25 MCG tablet Take 25 mcg by mouth every morning.    04/22/2016 at Unknown time  . mometasone-formoterol (DULERA) 100-5 MCG/ACT AERO Inhale 2 puffs into the lungs 2 (two) times daily.   04/22/2016 at Unknown time  . ondansetron (ZOFRAN) 4 MG tablet Take 4 mg by mouth every 8 (eight) hours as needed for nausea or vomiting.   04/22/2016 at Unknown time  . Probiotic Product (ACIDOPHILUS/GOAT MILK) CAPS Take by mouth daily.   Past Week at Unknown time  . propranolol (INDERAL) 60 MG tablet Take 1 tablet (60 mg total) by mouth 2 (two) times daily. 180 tablet 3 04/22/2016 at Unknown time  . sitaGLIPtin (JANUVIA) 100 MG tablet Take 100 mg by mouth every evening. Reported on 01/10/2016   04/22/2016 at Unknown time  . SUMAtriptan (IMITREX) 25 MG tablet Take 1 tablet (25 mg total) by mouth every 2 (two) hours as needed for migraine. May repeat in 2 hours if headache persists or recurs. 30 tablet 3 04/22/2016 at Unknown time  . SUMAtriptan Succinate Refill 4 MG/0.5ML SOCT Inject 4 mg into the skin as needed. 5 mL 6 Past Month at Unknown time  . valACYclovir (VALTREX) 1000 MG tablet Take 2,000 mg by mouth 2 (two) times daily as needed (Fever blisters). Reported on 01/10/2016  2  Past Month at Unknown time  . vitamin B-12 500 MCG tablet Take 1 tablet (500 mcg total) by mouth daily. 30 tablet 0 04/22/2016 at Unknown time  . albuterol (PROVENTIL) (2.5 MG/3ML) 0.083% nebulizer solution Take 2.5 mg by nebulization every 6 (six) hours as needed for wheezing or shortness of breath. Reported on 01/10/2016   Rescue  . ciprofloxacin (CIPRO) 500 MG tablet Take 1 tablet (500 mg total) by mouth 2 (two) times daily. (Patient not taking: Reported on 04/23/2016) 10 tablet 0 Completed Course at Unknown time   Results for orders placed or performed during the hospital encounter of 04/23/16 (from the past 48 hour(s))  Wet prep, genital     Status: Abnormal   Collection Time: 04/23/16  5:17 PM  Result Value Ref Range   Yeast Wet Prep HPF POC NONE SEEN NONE SEEN   Trich, Wet Prep NONE SEEN NONE SEEN   Clue Cells Wet Prep HPF POC PRESENT (A) NONE SEEN   WBC, Wet Prep HPF POC FEW (A) NONE SEEN    Comment: MODERATE BACTERIA SEEN   Sperm NONE SEEN     Review of Systems  Constitutional: Negative for chills and fever.  Genitourinary:       + vaginal lesion; left labia + white vaginal discharge with odor.   Physical Exam   Blood pressure 121/78, pulse 70, temperature 98.2 F (36.8 C), temperature source Oral, resp. rate 18.  Physical Exam  Constitutional: She appears well-developed and well-nourished. No distress.  HENT:  Head: Normocephalic.  Genitourinary:     Genitourinary Comments: Speculum exam: 0.5 inch Left labial nodule noted. Some firmness, minimal fluctuance. No surrounding erythema. Tender to touch.  Vagina -  Small amount of creamy discharge, + odor  Cervix - surgically absent  Bimanual exam: deferred  Wet prep done Chaperone present for exam.  Skin: She is not diaphoretic.    MAU Course  Procedures  None   MDM  Wet prep  Dr. Gala Romney called to exam room for further evaluation of labial nodule, please see her note for further documentation.   Assessment and  Plan   A:  1. Vaginal lump   2. Vaginal pain   3. BV (bacterial vaginosis)     P:  Discharge home in stable condition Follow up with GYN Rx: Flagyl- no alcohol Return to MAU if symptoms worsen    Lezlie Lye, NP 04/23/2016 7:58 PM   Pt seen and examined 1 x 1.5 cm fluctuant area  Drainage of area Pt consented Area clened with betadine Lidocaine 1% 3 cc 18 gauge needle used to incise and drain mass 3 ml of purulant bloody material drained Good hemostasis at end of prodecure Silver nitrate x1 used Pt to f/u with Dr. Sabra Heck.

## 2016-04-26 ENCOUNTER — Ambulatory Visit (INDEPENDENT_AMBULATORY_CARE_PROVIDER_SITE_OTHER): Payer: Medicare Other | Admitting: Obstetrics & Gynecology

## 2016-04-26 ENCOUNTER — Encounter: Payer: Self-pay | Admitting: Obstetrics & Gynecology

## 2016-04-26 VITALS — BP 102/62 | HR 80 | Resp 18 | Ht 65.0 in | Wt 237.0 lb

## 2016-04-26 DIAGNOSIS — N764 Abscess of vulva: Secondary | ICD-10-CM

## 2016-04-26 MED ORDER — SULFAMETHOXAZOLE-TRIMETHOPRIM 800-160 MG PO TABS: 1.0000 | ORAL_TABLET | Freq: Two times a day (BID) | ORAL | 0 refills | Status: DC

## 2016-04-26 NOTE — Progress Notes (Signed)
70 y.o. [id number] MarriedCaucasianF here for new patient exam.  Pt prior pt but not seen for several years.  Pt was seen on 04/23/16 at MAU where left vulvar abscess was noted.  Lesion was fluctuant and was drained with #18 gauge needle.  Pt reports lesion had been present for a couple of weeks.  Pt denies fever.  However, she continues to feel fullness and pain when she sits and the area continues to be tender to touch.  She denies any drainage since the MAU visit.    She is diabetic and on oral agents.  Lab HbA1C was 03/23/16 and was 7.1  She is also on aspirin and Eliquis due to h/o multiple small PEs that developed after laparoscopic appendectomy for perforated appendix.  She was treated with Coumadin x 1 year before being switched to Eliquis.  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    Patient's last menstrual period was 09/18/1979 (approximate).          Sexually active: No.  The current method of family planning is status post hysterectomy.    Exercising: No.  The patient does not participate in regular exercise at present. Smoker:  no  Health Maintenance: Pap:  2017 Normal. W/ PCP.  I cannot find this in EPIC. History of abnormal Pap:  no MMG:  07/13/13 [place] Right BIRADS1:Neg Colonoscopy:  04/10/10 Normal - f/u 5 years  BMD:   None TDaP: w/ PCP Pneumonia vaccine(s):  03/2010 Zostavax:   2016 Hep C testing: Unsure Screening Labs: PCP   reports that she quit smoking about 34 years ago. She has never used smokeless tobacco. She reports that she does not drink alcohol or use drugs.  Past Medical History:  Diagnosis Date  . Anemia   . Anxiety   . Bronchitis    hx of   . Clotting disorder ([place]) 02-17-14   S/p appendectomy-developed Pulmonary emboli-tx. warfarin-d/c 1 yr ago.  . Colon polyps   . Depression   . Diabetes mellitus without complication ([place])    "borderline"- oral med  . Disorder of vocal cords    spasmotic dysphonia ,02-17-14 has" whispery  voice-low tone"  . Hyperthyroidism   . Kidney stones   . Migraines   . Nausea Nov 15, 2014   cycle of migraine headaches and nausea  . Neuromuscular disorder ([place])    hands and throat"spasmodic dysphonia", tremors - thimb and forefinger   . Obesity, Class III, BMI 40-49.9 (morbid obesity) ([place]) 04/27/2010   02-17-14 reports some weight loss- intentional  . Osteoarthritis    in back(chronic pain)  . Pelvic pain    02-17-14 "states thinks its back related on left groin"  . Pulmonary emboli [facility])    s/p Appendectomy '13 [facility]  . Reflux   . Right knee pain   . Sleep apnea    cpap - settings at 4   . Toxic goiter     Past Surgical History:  Procedure Laterality Date  . ABDOMINAL HYSTERECTOMY    . APPENDECTOMY     3'13 [facility] s/p developed  Pulmonary Emboli  . BOTOX INJECTION     for migranes  . CATARACT EXTRACTION, BILATERAL Bilateral 03/2013   . CHOLECYSTECTOMY    . HAND SURGERY  2004   both hands  . KIDNEY STONE SURGERY    . KNEE ARTHROSCOPY  1998   right  . LAPAROSCOPIC APPENDECTOMY  11/27/2011   Procedure: APPENDECTOMY LAPAROSCOPIC;  Surgeon: Adin Hector, MD;  Location: [facility];  Service: General;  Laterality: N/A;  . TOTAL KNEE ARTHROPLASTY Right 12/06/2014   Procedure: RIGHT TOTAL KNEE ARTHROPLASTY;  Surgeon: Gaynelle Arabian, MD;  Location: [facility];  Service: Orthopedics;  Laterality: Right;  . TOTAL KNEE REVISION Left 02/24/2014   Procedure: LEFT TOTAL KNEE REVISION;  Surgeon: Gearlean Alf, MD;  Location: [facility];  Service: Orthopedics;  Laterality: Left;  . vocal cord surgery  02-17-14   01-12-14 done in [place] [facility](s/p selective denervation/reinnervation recurrent laryngeal nerve surgery)  . zenkers diverticulum      Current Outpatient Prescriptions  Medication Sig Dispense Refill  . albuterol (PROVENTIL HFA;VENTOLIN HFA) 108 (90 BASE) MCG/ACT inhaler Inhale 1 puff into the lungs every 6 (six) hours as needed for wheezing or shortness of breath.    [name] albuterol  (PROVENTIL) (2.5 MG/3ML) 0.083% nebulizer solution Take 2.5 mg by nebulization every 6 (six) hours as needed for wheezing or shortness of breath. Reported on 01/10/2016    . apixaban (ELIQUIS) 5 MG TABS tablet Take 1 tablet (5 mg total) by mouth 2 (two) times daily. X 7 days, then 1 tablet (5 mg) two times daily starting 03/31/16 180 tablet 3  . aspirin EC 81 MG tablet Take 81 mg by mouth daily.    . benzonatate (TESSALON) 100 MG capsule Take 100 mg by mouth 2 (two) times daily as needed for cough.    [name] BIOTIN PO Take 1 capsule by mouth daily.    [name] buPROPion (WELLBUTRIN XL) 150 MG 24 hr tablet Take 150 mg by mouth every morning.  0  . CHERATUSSIN AC 100-10 MG/5ML syrup Take 5-10 mLs by mouth every 8 (eight) hours as needed for cough.  1  . cholecalciferol (VITAMIN D) 1000 units tablet Take 1,000 Units by mouth daily.    . clonazePAM (KLONOPIN) 0.5 MG tablet Take 1 tablet (0.5 mg total) by mouth at bedtime. 30 tablet 0  . cyanocobalamin (,VITAMIN B-12,) 1000 MCG/ML injection Inject 1 mL into the skin every 30 (thirty) days.  11  . cyclobenzaprine (FLEXERIL) 10 MG tablet Take 1 tablet (10 mg total) by mouth at bedtime. 90 tablet 4  . diphenhydrAMINE (BENADRYL) 25 MG tablet Take 50 mg by mouth every 6 (six) hours as needed for itching.    . DULoxetine (CYMBALTA) 60 MG capsule Take 1 capsule (60 mg total) by mouth every morning. 90 capsule 4  . gabapentin (NEURONTIN) 800 MG tablet 800mg  in the morning and 1600mg  at pm (Patient taking differently: Take 800-1,600 mg by mouth 2 (two) times daily. 800mg  in the morning and 1600mg  at pm) 90 tablet 6  . ibuprofen (ADVIL,MOTRIN) 200 MG tablet Take 800 mg by mouth every 8 (eight) hours as needed for mild pain.    [name] levothyroxine (SYNTHROID, LEVOTHROID) 25 MCG tablet Take 25 mcg by mouth every morning.     . metroNIDAZOLE (FLAGYL) 500 MG tablet Take 1 tablet (500 mg total) by mouth 2 (two) times daily. 14 tablet 0  . mometasone-formoterol (DULERA) 100-5 MCG/ACT  AERO Inhale 2 puffs into the lungs 2 (two) times daily.    . ondansetron (ZOFRAN) 4 MG tablet Take 4 mg by mouth every 8 (eight) hours as needed for nausea or vomiting.    . Probiotic Product (ACIDOPHILUS/GOAT MILK) CAPS Take by mouth daily.    . propranolol (INDERAL) 60 MG tablet Take 1 tablet (60 mg total) by mouth 2 (two) times daily. 180 tablet 3  . sitaGLIPtin (JANUVIA) 100 MG tablet Take 100 mg by mouth  every evening. Reported on 01/10/2016    . SUMAtriptan (IMITREX) 25 MG tablet Take 1 tablet (25 mg total) by mouth every 2 (two) hours as needed for migraine. May repeat in 2 hours if headache persists or recurs. 30 tablet 3  . SUMAtriptan Succinate Refill 4 MG/0.5ML SOCT Inject 4 mg into the skin as needed. 5 mL 6  . valACYclovir (VALTREX) 1000 MG tablet Take 2,000 mg by mouth 2 (two) times daily as needed (Fever blisters). Reported on 01/10/2016  2  . vitamin B-12 500 MCG tablet Take 1 tablet (500 mcg total) by mouth daily. 30 tablet 0  . permethrin (ELIMITE) 5 % cream      No current facility-administered medications for this visit.     Family History  Problem Relation Age of Onset  . Heart disease Mother   . Pneumonia Father   . Cirrhosis Father   . Hypertension Father   . Alcoholism Father   . Heart attack Paternal Grandfather   . Heart attack Maternal Grandfather   . Heart attack Paternal Grandmother   . Heart attack Maternal Grandmother     ROS:  Pertinent items are noted in HPI.  Otherwise, a comprehensive ROS was negative.  Exam:   BP 102/62 (BP Location: Right Arm, Patient Position: Sitting, Cuff Size: Large)   Pulse 80   Resp 18   Ht 5\' 5"  (1.651 m)   Wt 237 lb (107.5 kg)   LMP 09/18/1979 (Approximate)   BMI 39.44 kg/m    Height: 5\' 5"  (165.1 cm)  Ht Readings from Last 3 Encounters:  04/26/16 5\' 5"  (1.651 m)  04/09/16 5' 6.5" (1.689 m)  03/12/16 5' 6.5" (1.689 m)    General appearance: alert, cooperative and appears stated age Abdomen: soft, non-tender;  bowel sounds normal; no masses,  no organomegaly Extremities: extremities normal, atraumatic, no cyanosis or edema Skin: Skin color, texture, turgor normal. No rashes or lesions Lymph nodes: no inguinal LAD noted. Neurologic: Grossly normal  Pelvic: External genitalia:  3 x 2.5cm lesion on inner left labia majora with fluctuance.  Hard core like mass noted within this.  Surrounding induration is seen.             Urethra:  normal appearing urethra with no masses, tenderness or lesions              Bartholins and Skenes: normal                 Vagina: normal appearing vagina with normal color and discharge, no lesions              Cervix: absent              Pap taken: No. Bimanual Exam:  Uterus:  uterus absent              Adnexa: no mass, fullness, tenderness               Rectovaginal: Confirms               Anus:  normal sphincter tone, no lesions  Chaperone was present for exam.  Procedure:  With fluctuance and reoccurance of the abscess, I&D recommended.  Area cleansed with Betadine x 3.  Using sterile technique, area instilled with 2cc 1% Lidocaine.  Using #11 blade, abscess opened.  Purulent material drained.  Then hard core expressed consistent with sebaceous material.  However, no cystic edge could be identified, possibly due to significant induration.  Culture was obtained.  Lesion not deep enough to require packing.  Silver nitrate used for hemostasis.  Pt tolerated procedure well.  A:  Left labial abscess, probably originating in a sebaceous cyst DM, on oral agents H/O multiple bilateral small PE's after laparoscopic appendectomy, on Eliquis and ASA  P:   Culture pending.  As this is second I&D this week, treated with bactrim DS bid x 7 days. Follow up 7-10 days will be planned.  Pt may end up needed excision of sebaceous cyst as well but need to see what lesion is like after infection treated.

## 2016-04-30 ENCOUNTER — Other Ambulatory Visit: Payer: Self-pay

## 2016-04-30 ENCOUNTER — Telehealth: Payer: Self-pay | Admitting: Obstetrics & Gynecology

## 2016-04-30 LAB — WOUND CULTURE: Gram Stain: NONE SEEN

## 2016-04-30 MED ORDER — FLUCONAZOLE 150 MG PO TABS: 150.0000 mg | ORAL_TABLET | Freq: Once | ORAL | 0 refills | Status: AC

## 2016-04-30 NOTE — Telephone Encounter (Signed)
Patient called and said, "I have been taking antibiotics and now I have a funny feeling on the tip of my tongue. I wonder if it is thrush or yeast. I was wondering if Dr. Sabra Heck could call in something for me. We are going out of town this afternoon."  Pharmacy on file is correct.

## 2016-04-30 NOTE — Telephone Encounter (Signed)
Called pt and advised Diflucan 150mg  po x 1, repeat 72 hrs.

## 2016-04-30 NOTE — Telephone Encounter (Signed)
Spoke with patient. Patient states that she is currently taking 2 antibiotics after treatment for an abscess. She was seen in the office with Dr.Miller on 04/26/2016 for I&D of left labial abscess and was started on Bactrim BID x 7 days. Prescribed Flagyl 500 mg bid from NP in ED on 04/23/2016. Reports she woke up this morning and her tongue is sore and had a white coating on it. Requesting a medication to treat thrush as she is going out of town today and does not want to this to get worse. [facility] Advised I will speak with Dr.Miller and return call with further recommendations. She is agreeable.

## 2016-05-08 ENCOUNTER — Encounter: Payer: Self-pay | Admitting: Obstetrics & Gynecology

## 2016-05-08 ENCOUNTER — Ambulatory Visit (INDEPENDENT_AMBULATORY_CARE_PROVIDER_SITE_OTHER): Payer: Medicare Other | Admitting: Obstetrics & Gynecology

## 2016-05-08 VITALS — BP 94/60 | HR 82 | Temp 98.5°F | Resp 18 | Wt 237.2 lb

## 2016-05-08 DIAGNOSIS — M4806 Spinal stenosis, lumbar region: Secondary | ICD-10-CM | POA: Diagnosis not present

## 2016-05-08 DIAGNOSIS — R309 Painful micturition, unspecified: Secondary | ICD-10-CM | POA: Diagnosis not present

## 2016-05-08 DIAGNOSIS — G8929 Other chronic pain: Secondary | ICD-10-CM | POA: Diagnosis not present

## 2016-05-08 DIAGNOSIS — M5410 Radiculopathy, site unspecified: Secondary | ICD-10-CM | POA: Diagnosis not present

## 2016-05-08 DIAGNOSIS — R8290 Unspecified abnormal findings in urine: Secondary | ICD-10-CM | POA: Diagnosis not present

## 2016-05-08 DIAGNOSIS — M5440 Lumbago with sciatica, unspecified side: Secondary | ICD-10-CM | POA: Diagnosis not present

## 2016-05-08 LAB — POCT URINALYSIS DIPSTICK
Bilirubin, UA: NEGATIVE
Glucose, UA: NEGATIVE
Ketones, UA: NEGATIVE
Nitrite, UA: NEGATIVE
Urobilinogen, UA: NEGATIVE
pH, UA: 5

## 2016-05-08 MED ORDER — NITROFURANTOIN MONOHYD MACRO 100 MG PO CAPS: 100.0000 mg | ORAL_CAPSULE | Freq: Two times a day (BID) | ORAL | 0 refills | Status: DC

## 2016-05-08 NOTE — Progress Notes (Signed)
Spoke with [facility] in [place]. Prescription for macrobid cancelled per Dr. Sabra Heck.

## 2016-05-08 NOTE — Progress Notes (Signed)
GYNECOLOGY  VISIT   HPI: 70 y.o. [id number] Married Caucasian female here for complaint of "sudden onset of pelvic pressure" today while she was out shopping.  Pt feels like she has a UTI with pressure and increased frequency.  She also feels like she is having some dysuria.  Denies hematuria.  Pt had a spinal cord stimulator placed this morning and was placed on antibiotics for this.  She is not completely sure what she will be taking but appears it will be keflex 500mg  qid for 7 days.  Has chronic back pain but this is stable.  Pt had recurrent vulvar abscess that was opened in the office on 8/101/7 after pt was seen in [facility] on 04/23/16 by Dr. Gala Romney.  She reports this is completely normal and feels like it has fully healed.  Denies any vaginal or vulvar drainage.  Still "floored" by wound culture that was positive for E Coli.  Reports she doesn't know how a GI bacteria could cause an abscess and states she tries very hard to "stay clean".  D/W pt cleanliness has nothing to do with this as vagina, bladder, and rectum are slow close in proximity, it is not uncommon to see E coli as cause of UTI.  Also, D/w pt that many vulvar abscesses are MRSA and hers was clearly not this so E coli is typically much easier to treat.  Pt felt reassured by this.  GYNECOLOGIC HISTORY: Patient's last menstrual period was 09/18/1979 (approximate).   Patient Active Problem List   Diagnosis Date Noted  . Obesity, Class II, BMI 35-39.9, isolated (see actual BMI) ([place]) 04/09/2016  . UTI (lower urinary tract infection)   . Paresthesia of both lower extremities 03/22/2016  . Vertigo 03/22/2016  . Acute UTI 03/22/2016  . Essential tremor 03/12/2016  . Fall 03/12/2016  . Low back pain 03/12/2016  . Coagulopathy ([place]) 07/20/2015  . Diabetes mellitus without complication ([place]) [id number]  . Essential hypertension 06/03/2015  . OA (osteoarthritis) of knee 12/06/2014  . Hypothyroidism 03/03/2014  . Asthma 03/03/2014  . Acute  blood loss anemia 03/03/2014  . Left knee pain 03/03/2014  . Failed total knee, left ([place]) 02/24/2014  . Failed total knee arthroplasty ([place]) 02/24/2014  . OSA on CPAP 12/23/2013  . History of pulmonary embolus (PE) 12/21/2013  . Appendicitis, acute, with peritonitis 11/27/2011  . 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 Obesity, Class III, BMI 40-49.9 (morbid obesity) ([place]) 04/27/2010  . ANEMIA 04/27/2010  . ANXIETY DEPRESSION 04/27/2010  . Migraine 04/27/2010  . NEUROPATHY 04/27/2010  . HEMORRHOIDS 04/27/2010  . GERD 04/27/2010  . DIVERTICULOSIS, COLON 04/27/2010  . IBS 04/27/2010  . CHOLELITHIASIS 04/27/2010  . RENAL CALCULUS 04/27/2010    Past Medical History:  Diagnosis Date  . Anemia   . Anxiety   . Bronchitis    hx of   . Clotting disorder ([place]) 02-17-14   S/p appendectomy-developed Pulmonary emboli-tx. warfarin-d/c 1 yr ago.  . Colon polyps   . Depression   . Diabetes mellitus without complication ([place])    "borderline"- oral med  . Disorder of vocal cords    spasmotic dysphonia ,02-17-14 has" whispery voice-low tone"  . Hyperthyroidism   . Kidney stones   . Migraines   . Nausea Nov 15, 2014   cycle of migraine headaches and nausea  . Neuromuscular disorder ([place])    hands and throat"spasmodic dysphonia", tremors - thimb and forefinger   . Obesity, Class III, BMI 40-49.9 (morbid obesity) ([place]) 04/27/2010   02-17-14  reports some weight loss- intentional  . Osteoarthritis    in back(chronic pain)  . Pelvic pain    02-17-14 "states thinks its back related on left groin"  . Pulmonary emboli [facility])    s/p Appendectomy '13 [facility]  . Reflux   . Right knee pain   . Sleep apnea    cpap - settings at 4   . Toxic goiter     Past Surgical History:  Procedure Laterality Date  . ABDOMINAL HYSTERECTOMY    . APPENDECTOMY     3'13 [facility] s/p developed  Pulmonary Emboli  . BOTOX INJECTION     for migranes  . CATARACT EXTRACTION, BILATERAL Bilateral 03/2013   . CHOLECYSTECTOMY    .  HAND SURGERY  2004   both hands  . KIDNEY STONE SURGERY    . KNEE ARTHROSCOPY  1998   right  . LAPAROSCOPIC APPENDECTOMY  11/27/2011   Procedure: APPENDECTOMY LAPAROSCOPIC;  Surgeon: Adin Hector, MD;  Location: [facility];  Service: General;  Laterality: N/A;  . TOTAL KNEE ARTHROPLASTY Right 12/06/2014   Procedure: RIGHT TOTAL KNEE ARTHROPLASTY;  Surgeon: Gaynelle Arabian, MD;  [facility];  Service: Orthopedics;  Laterality: Right;  . TOTAL KNEE REVISION Left 02/24/2014   Procedure: LEFT TOTAL KNEE REVISION;  Surgeon: Gearlean Alf, MD;  [facility];  Service: Orthopedics;  Laterality: Left;  . vocal cord surgery  02-17-14   01-12-14 done in [place] [facility](s/p selective denervation/reinnervation recurrent laryngeal nerve surgery)  . zenkers diverticulum      MEDS:  Reviewed in EPIC and UTD  ALLERGIES: Bee venom; Tape; Etodolac; Morphine; Other; Poison ivy extract  [extract of poison ivy]; Propoxyphene; Propoxyphene n-acetaminophen; and Penicillins  Family History  Problem Relation Age of Onset  . Heart disease Mother   . Pneumonia Father   . Cirrhosis Father   . Hypertension Father   . Alcoholism Father   . Heart attack Paternal Grandfather   . Heart attack Maternal Grandfather   . Heart attack Paternal Grandmother   . Heart attack Maternal Grandmother    SH:  Married, non smoker  Review of Systems  Genitourinary: Positive for dysuria, frequency and urgency. Negative for flank pain and hematuria.  Musculoskeletal: Positive for back pain.  Neurological: Negative.   Psychiatric/Behavioral: Negative.     PHYSICAL EXAMINATION:    BP 94/60 (BP Location: Right Arm, Patient Position: Sitting)   Pulse 82   Temp 98.5 F (36.9 C) (Oral)   Resp 18   Wt 237 lb 3.2 oz (107.6 kg)   LMP 09/18/1979 (Approximate)   BMI 39.47 kg/m     General appearance: alert, cooperative and appears stated age Abdomen: soft, non-tender; bowel sounds normal; no masses,  no  organomegaly  Pelvic: External genitalia:  no lesions, area of I&D has healed well.  No masses or firmness.  No evidence of sebaceous cyst in this area as well              Urethra:  normal appearing urethra with no masses, tenderness or lesions              Bartholins and Skenes: normal                 Vagina: normal appearing vagina with normal color and discharge, no lesions              Cervix: absent              Bimanual Exam:  Uterus:  uterus absent              Adnexa: no mass, fullness, tenderness               Chaperone was present for exam.  Assessment: UTI? Versus possible side effect of placement of nerve stimulator Diabetes H/O renal calculi   Plan: Macrobid 100mg  bid x 7 days initially sent to pharmacy but once keflex dosing was confirmed, this was cancelled.  As pt will be on keflex 500mg  qid and pt has multiple allergies, will just obtain urine culture and send microscopic urine as well.  This way I can be sure she indeed has a UTI as well as having adequate antibiotic coverage for whatever bacteria is present.

## 2016-05-09 LAB — URINALYSIS, MICROSCOPIC ONLY
Casts: NONE SEEN [LPF]
Crystals: NONE SEEN [HPF]
Squamous Epithelial / LPF: NONE SEEN [HPF] (ref <=5)
Yeast: NONE SEEN [HPF]

## 2016-05-10 ENCOUNTER — Ambulatory Visit: Payer: Self-pay | Admitting: Obstetrics & Gynecology

## 2016-05-10 LAB — URINE CULTURE: Organism ID, Bacteria: NO GROWTH

## 2016-05-14 ENCOUNTER — Telehealth: Payer: Self-pay | Admitting: Neurology

## 2016-05-14 NOTE — Telephone Encounter (Signed)
Pt called to advise she has a temporary SNS that is being removed tomorrow and is inquiring if she should wait to have NCV/EMG or will it mater at all.

## 2016-05-14 NOTE — Telephone Encounter (Signed)
She has spoken to the spinal nerve stimulator rep who has advised her it is safe to keep her NCV/EMG appt on 05/18/16.

## 2016-05-15 DIAGNOSIS — G8929 Other chronic pain: Secondary | ICD-10-CM | POA: Diagnosis not present

## 2016-05-15 DIAGNOSIS — M5440 Lumbago with sciatica, unspecified side: Secondary | ICD-10-CM | POA: Diagnosis not present

## 2016-05-15 DIAGNOSIS — Z9689 Presence of other specified functional implants: Secondary | ICD-10-CM | POA: Diagnosis not present

## 2016-05-15 DIAGNOSIS — G894 Chronic pain syndrome: Secondary | ICD-10-CM | POA: Diagnosis not present

## 2016-05-18 ENCOUNTER — Ambulatory Visit (INDEPENDENT_AMBULATORY_CARE_PROVIDER_SITE_OTHER): Payer: Medicare Other | Admitting: Neurology

## 2016-05-18 DIAGNOSIS — M5441 Lumbago with sciatica, right side: Secondary | ICD-10-CM | POA: Diagnosis not present

## 2016-05-18 DIAGNOSIS — J383 Other diseases of vocal cords: Secondary | ICD-10-CM

## 2016-05-18 DIAGNOSIS — F341 Dysthymic disorder: Secondary | ICD-10-CM | POA: Diagnosis not present

## 2016-05-18 DIAGNOSIS — G250 Essential tremor: Secondary | ICD-10-CM | POA: Diagnosis not present

## 2016-05-18 DIAGNOSIS — G43719 Chronic migraine without aura, intractable, without status migrainosus: Secondary | ICD-10-CM | POA: Diagnosis not present

## 2016-05-18 DIAGNOSIS — W19XXXS Unspecified fall, sequela: Secondary | ICD-10-CM

## 2016-05-18 MED ORDER — GABAPENTIN 300 MG PO CAPS: 600.0000 mg | ORAL_CAPSULE | Freq: Three times a day (TID) | ORAL | 4 refills | Status: DC

## 2016-05-18 MED ORDER — ONDANSETRON 8 MG PO TBDP: 8.0000 mg | ORAL_TABLET | Freq: Three times a day (TID) | ORAL | 4 refills | Status: DC | PRN

## 2016-05-18 NOTE — Progress Notes (Signed)
No chief complaint on file.     PATIENT: Hayley [name] DOB: [birth year]  No chief complaint on file.    HISTORICAL  Hayley [name] is a 70 years old right-handed female, seen in refer by hand surgeon Dr. Daryll Brod, and her primary care PA Flora Lipps for evaluation of neck pain, radiating pain to bilateral shoulder, upper extremity  She has past medical history of spasmodic dysphonia, status post surgical correction, also had a history of hypertension, diabetes, DVT, PE following surgical procedure, most recent was right knee replacement in March 2016, is taking anticoagulation pradaxa, chronic low back pain, previous history of epidural injection for low back pain.  She fell from law mower in June 2016, landed on her back, she was able to got up, finished mowing her lawn, later that day, she noticed worsening low back pain, significant neck pain, radiating pain to her bilateral shoulder, upper extremity, hands, she also noticed bilateral hands paresthesia, burning sensation, subjective weakness, she had worsening urinary urgency, wear pads occasionally, mild unbalanced gait, mild constipation  She was evaluated by [facility] orthopedic surgeon Dr. Rolena Infante for her worsening low back pain, reported MRI of lumbar spine at [place] in July 2016, was told she is not a surgical candidate, she does has multilevel lumbar degenerative disc disease.  She is also concerned about her bilateral hands tremor, getting worse since 2015, most noticeable when she writes, holding utensils, there was no similar family history  UPDATE August 24th 2016: She continues to complain bilateral neck pain, radiating pain to bilateral shoulder, arm, We have reviewed MRI cervical spine (without) demonstrating: 1. At C3-4: disc bulging and uncovertebral joint hypertrophy and facet hypertrophy with severe left foraminal stenosis 2. At C4-5: disc bulging and uncovertebral joint hypertrophy with severe biforaminal  stenosis 3. At C5-6: disc bulging and uncovertebral joint hypertrophy with moderate right and severe left foraminal stenosis 4. At C6-7: disc bulging and uncovertebral joint hypertrophy with moderate biforaminal stenosis 5. No intrinsic or compressive spinal cord lesions  Propanolol 40 mg twice a day has helped her tremor, she denies significant side effect.  UPDATE June 26th 2017: She continue complains of bilateral lower extremity posturing tremor, significant neck pain, radiating pain to bilateral upper extremity, increased unsteady gait, she has fell few times, worsening bilateral lower extremity paresthesia  UPDATE Sept 1 2017: She has worsening low back pain, it gets worse with any movement, difficulty changing bed linen, she is no longer cooking,   She recently had spinal stimulator trial by [facility] by Dr. Delfino Lovett Rauck,,was deemed to be a potential candidate, she did responding well to the spinal stimulator. She complains of midline constant low back pain, radiating down to right hip, right leg and left leg, getting worse with movement, she has bladder urgency, no bowel incontinence.   She had physical therapy, last in July 2017,    She also complains of migraine, seems to get more frequent since March 2017, she is now having headaches 3-5/week, lasting for 4 hours or longer, with light noise sensitivity nauseous,  She used to get Botox injection as migraine prevention from Dr. Samuel Germany, responded very well, last injection was in 2015, responding well. She is taking Imitrex, Zofran as needed  She is on antibiotics for chronic UTI,   REVIEW OF SYSTEMS: Full 14 system review of systems performed and notable only for above   ALLERGIES: Allergies  Allergen Reactions  . Bee Venom Anaphylaxis  . Tape Rash  Rash with paper tape  . Etodolac Diarrhea and Nausea And Vomiting  . Morphine Other (See Comments)    Hallucinations  . Other Itching and Swelling  . Poison Ivy  Extract  [Extract Of Poison Ivy] Itching and Swelling  . Propoxyphene Nausea And Vomiting  . Propoxyphene N-Acetaminophen Nausea And Vomiting  . Penicillins Rash    Has patient had a PCN reaction causing immediate rash, facial/tongue/throat swelling, SOB or lightheadedness with hypotension: Yes Has patient had a PCN reaction causing severe rash involving mucus membranes or skin necrosis: No Has patient had a PCN reaction that required hospitalization No Has patient had a PCN reaction occurring within the last 10 years: No If all of the above answers are "NO", then may proceed with Cephalosporin use.     HOME MEDICATIONS: Current Outpatient Prescriptions  Medication Sig Dispense Refill  . albuterol (PROVENTIL HFA;VENTOLIN HFA) 108 (90 BASE) MCG/ACT inhaler Inhale 1 puff into the lungs every 6 (six) hours as needed for wheezing or shortness of breath.    [name] albuterol (PROVENTIL) (2.5 MG/3ML) 0.083% nebulizer solution Take 2.5 mg by nebulization every 6 (six) hours as needed for wheezing or shortness of breath.    . benzonatate (TESSALON) 100 MG capsule Take 100 mg by mouth 3 (three) times daily as needed for cough.    . clonazePAM (KLONOPIN) 0.5 MG tablet Take 0.5 mg by mouth at bedtime.    . cyclobenzaprine (FLEXERIL) 10 MG tablet Take 10 mg by mouth at bedtime.     [name] EPINEPHrine (EPIPEN) 0.3 mg/0.3 mL DEVI Inject 0.3 mg into the muscle once as needed (Allergic reaction).     . fluorometholone (FML) 0.1 % ophthalmic suspension Place 1 drop into both eyes daily as needed (Eye allergies).   0  . fluticasone (FLONASE) 50 MCG/ACT nasal spray Place 1 spray into both nostrils daily as needed for allergies or rhinitis.    [name] gabapentin (NEURONTIN) 800 MG tablet Take 800-1,600 mg by mouth 2 (two) times daily. 800 in the morning and 1600 at pm    . HYDROcodone-acetaminophen (NORCO/VICODIN) 5-325 MG per tablet Take one to two tablets by mouth every 4 hours as needed for pain. Do not exceed 4gm of Tylenol  in 24 hours 360 tablet 0  . levothyroxine (SYNTHROID, LEVOTHROID) 25 MCG tablet Take 25 mcg by mouth every morning.     . methocarbamol (ROBAXIN) 500 MG tablet Take 1 tablet (500 mg total) by mouth every 6 (six) hours as needed for muscle spasms. 90 tablet 0  . mometasone-formoterol (DULERA) 100-5 MCG/ACT AERO Inhale 2 puffs into the lungs 2 (two) times daily.    [name] nystatin (MYCOSTATIN) 100000 UNIT/ML suspension Take 5 mLs by mouth daily as needed (Yeast).    . ondansetron (ZOFRAN) 4 MG tablet Take 4 mg by mouth every 8 (eight) hours as needed for nausea or vomiting.    . ondansetron (ZOFRAN-ODT) 8 MG disintegrating tablet Take 8 mg by mouth every 8 (eight) hours as needed for nausea or vomiting.    [name] oxyCODONE (OXY IR/ROXICODONE) 5 MG immediate release tablet Take 1-2 tablets (5-10 mg total) by mouth every 3 (three) hours as needed for moderate pain or severe pain. 90 tablet 0  . pantoprazole (PROTONIX) 40 MG tablet Take 40 mg by mouth daily.    . Phenazopyridine HCl (AZO TABS PO) Take 1 tablet by mouth daily as needed (UTI).    . primidone (MYSOLINE) 50 MG tablet Take 50 mg by mouth at bedtime.    [name]  promethazine (PHENERGAN) 25 MG tablet Take 25 mg by mouth every 6 (six) hours as needed for nausea or vomiting.    . Rivaroxaban (XARELTO) 15 MG TABS tablet Take 1 tablet (15 mg total) by mouth 2 (two) times daily with a meal. 60 tablet 5  . rizatriptan (MAXALT) 10 MG tablet Take 10 mg by mouth as needed for migraine (migraine). May repeat in 2 hours if needed    . sertraline (ZOLOFT) 100 MG tablet Take 100 mg by mouth daily.    . sitaGLIPtin (JANUVIA) 100 MG tablet Take 100 mg by mouth every evening.     . SUMAtriptan (IMITREX) 6 MG/0.5ML SOLN injection Inject 6 mg into the skin every 2 (two) hours as needed for migraine or headache. May repeat in 2 hours if headache persists or recurs.    . valACYclovir (VALTREX) 1000 MG tablet Take 2,000 mg by mouth 2 (two) times daily as needed (Fever blisters).    2     PAST MEDICAL HISTORY: Past Medical History:  Diagnosis Date  . Anemia   . Anxiety   . Bronchitis    hx of   . Clotting disorder ([place]) 02-17-14   S/p appendectomy-developed Pulmonary emboli-tx. warfarin-d/c 1 yr ago.  . Colon polyps   . Depression   . Diabetes mellitus without complication ([place])    "borderline"- oral med  . Disorder of vocal cords    spasmotic dysphonia ,02-17-14 has" whispery voice-low tone"  . Hyperthyroidism   . Kidney stones   . Migraines   . Nausea Nov 15, 2014   cycle of migraine headaches and nausea  . Neuromuscular disorder ([place])    hands and throat"spasmodic dysphonia", tremors - thimb and forefinger   . Obesity, Class III, BMI 40-49.9 (morbid obesity) ([place]) 04/27/2010   02-17-14 reports some weight loss- intentional  . Osteoarthritis    in back(chronic pain)  . Pelvic pain    02-17-14 "states thinks its back related on left groin"  . Pulmonary emboli [facility])    s/p Appendectomy '13 [facility]  . Reflux   . Right knee pain   . Sleep apnea    cpap - settings at 4   . Toxic goiter     PAST SURGICAL HISTORY: Past Surgical History:  Procedure Laterality Date  . ABDOMINAL HYSTERECTOMY    . APPENDECTOMY     3'13 [facility] s/p developed  Pulmonary Emboli  . BOTOX INJECTION     for migranes  . CATARACT EXTRACTION, BILATERAL Bilateral 03/2013   . CHOLECYSTECTOMY    . HAND SURGERY  2004   both hands  . KIDNEY STONE SURGERY    . KNEE ARTHROSCOPY  1998   right  . LAPAROSCOPIC APPENDECTOMY  11/27/2011   Procedure: APPENDECTOMY LAPAROSCOPIC;  Surgeon: Adin Hector, MD;  [facility];  Service: General;  Laterality: N/A;  . TOTAL KNEE ARTHROPLASTY Right 12/06/2014   Procedure: RIGHT TOTAL KNEE ARTHROPLASTY;  Surgeon: Gaynelle Arabian, MD;  [facility];  Service: Orthopedics;  Laterality: Right;  . TOTAL KNEE REVISION Left 02/24/2014   Procedure: LEFT TOTAL KNEE REVISION;  Surgeon: Gearlean Alf, MD;  [facility];  Service: Orthopedics;  Laterality:  Left;  . vocal cord surgery  02-17-14   01-12-14 done in [place] [facility](s/p selective denervation/reinnervation recurrent laryngeal nerve surgery)  . zenkers diverticulum      FAMILY HISTORY: Family History  Problem Relation Age of Onset  . Heart disease Mother   . Pneumonia Father   .  Cirrhosis Father   . Hypertension Father   . Alcoholism Father   . Heart attack Paternal Grandfather   . Heart attack Maternal Grandfather   . Heart attack Paternal Grandmother   . Heart attack Maternal Grandmother     SOCIAL HISTORY:  Social History   Social History  . Marital status: Married    Spouse name: N/A  . Number of children: 5  . Years of education: 29   Occupational History  . Retired    Social History Main Topics  . Smoking status: Former Smoker    Quit date: 01/03/1982  . Smokeless tobacco: Never Used  . Alcohol use No  . Drug use: No  . Sexual activity: Not on file   Other Topics Concern  . Not on file   Social History Narrative   Lives at home with husband.   Right-handed.   2 cups caffeine per day.     PHYSICAL EXAM   There were no vitals filed for this visit.  Not recorded      There is no height or weight on file to calculate BMI.  PHYSICAL EXAMNIATION:  Gen: NAD, conversant, well nourised, obese, well groomed                     Cardiovascular: Regular rate rhythm, no peripheral edema, warm, nontender. Eyes: Conjunctivae clear without exudates or hemorrhage Neck: Supple, no carotid bruise. Pulmonary: Clear to auscultation bilaterally   NEUROLOGICAL EXAM:  MENTAL STATUS: Speech:    Mild dysphonic speech; fluent and spontaneous with normal comprehension.  Cognition:     Orientation to time, place and person     Normal recent and remote memory     Normal Attention span and concentration     Normal Language, naming, repeating,spontaneous speech     Fund of knowledge   CRANIAL NERVES: CN II: Visual fields are full to confrontation.  Fundoscopic exam is normal with sharp discs and no vascular changes. Pupils are round equal and briskly reactive to light. CN III, IV, VI: extraocular movement are normal. No ptosis. CN V: Facial sensation is intact to pinprick in all 3 divisions bilaterally. Corneal responses are intact.  CN VII: Face is symmetric with normal eye closure and smile. CN VIII: Hearing is normal to rubbing fingers CN IX, X: Palate elevates symmetrically. Phonation is normal. CN XI: Head turning and shoulder shrug are intact CN XII: Tongue is midline with normal movements and no atrophy.  MOTOR: Mild bilateral hands postural tremor, normal muscle tone, bulk, strength   REFLEXES: Reflexes are 2+ and symmetric at the biceps, triceps, knees, and ankles. Plantar responses are flexor.  SENSORY: Intact to light touch, pinprick, position sense, and vibration sense are intact in fingers and toes.  COORDINATION: Rapid alternating movements and fine finger movements are intact. There is no dysmetria on finger-to-nose and heel-knee-shin.    GAIT/STANCE: Posture is normal. Gait is cautious, mildly unsteady  DIAGNOSTIC DATA (LABS, IMAGING, TESTING) - I reviewed patient records, labs, notes, testing and imaging myself where available.   ASSESSMENT AND PLAN  Hayley [name] is a 70 y.o. female   Left cervical radiculopathy:  We have reviewed MRI cervical spine 2016 : Multilevel degenerative disc disease, severe foraminal stenosis at left C3-4, bilateral C4-5, left C5-6,:  I reviewed her gabapentin at lower dose 300 mg 2 tablets 3 times a day   Essential Tremor:  Tried primidone in the past without significant improvement, primidone has the potential interaction  with her polypharmacy  Keep propanolol 60 mg twice a day  Chronic anticoagulation:  History of DVT, PE, following surgical procedure, most recent one was March 2016 following her right knee replacement  On chronic pradaxa treatment  Worsening bilateral  lower extremity paresthesia, gait abnormality, falling episodes  No evidence of large fiber peripheral neuropathy lumbar sacral radiculopathy on today's electrodiagnostic study,   I have suggested her continue swimming exercise,  she is planning on to have spinal cord stimulator placement  Worsening chronic migraine headaches  Previously responded well to Botox injection, preauthorization started today, will return in one month for injection.    Marcial Pacas, M.D. Ph.D.  [facility] [address] [place], [place] [address] Ph: [phone] Fax: [phone]  CC: To Dr. Daryll Brod, Flora Lipps

## 2016-05-18 NOTE — Procedures (Signed)
   NCS (NERVE CONDUCTION STUDY) WITH EMG (ELECTROMYOGRAPHY) REPORT   STUDY DATE: May 18 2016 PATIENT NAME: Hayley [name] DOB: [birth year] MRN: [id number]    TECHNOLOGIST: Laretta Alstrom ELECTROMYOGRAPHER: Marcial Pacas M.D.  CLINICAL INFORMATION:  70 years old female with chronic neck, low back pain, bilateral upper and lower extremity paresthesia, mild unsteady gait  FINDINGS: NERVE CONDUCTION STUDY: Bilateral peroneal sensory responses were normal. Bilateral peroneal to EDB and tibial motor responses were normal. Bilateral tibial H reflexes were normal and symmetric.  Bilateral median, ulnar sensory and motor responses were normal.  NEEDLE ELECTROMYOGRAPHY: Selective needle examinations were performed at right upper, lower extremity muscles, right cervical, and right lumbosacral paraspinal muscles.  Needle examination of right first dorsal interossei, pronator teres, biceps, triceps, deltoid was normal.  There was no spontaneous activity at right cervical paraspinal muscles, right C5-6 and 7.  Needle examination of right tibialis anterior, tibialis posterior, peroneal longus, vastus lateralis, biceps femoris long head was normal.  Right hallucis longus: Increased insertional activity, 1 plus spontaneous activity, enlarged complex motor unit potential was mildly decreased recruitment patterns.  There was no spontaneous activity at right lumbosacral paraspinal muscles, right L4-5 S1.  IMPRESSION:   This is a normal study, there is no electrodiagnostic evidence of right cervical radiculopathy, right lumbar sacral radiculopathy, or large fiber peripheral neuropathy. The mild chronic neuropathic changes at right abductor hallucis longus can be related to be due to age related changes.   INTERPRETING PHYSICIAN:   Marcial Pacas M.D. Ph.D. [facility] [address], [place], [place] [address] [phone]

## 2016-05-18 NOTE — Patient Instructions (Signed)
[employer] [facility] in [place], [place]  1.3 mi Address: [address] [place], [place] [address]

## 2016-05-30 ENCOUNTER — Telehealth: Payer: Self-pay | Admitting: Neurology

## 2016-05-30 NOTE — Telephone Encounter (Signed)
Patient called to request refill of clonazePAM (KLONOPIN) 0.5 MG tablet and would like to discuss what could be done for essential tremor. Patient also adds, she's having SNS installed Monday September 18th. Please call [phone].

## 2016-05-30 NOTE — Telephone Encounter (Signed)
She is having her spinal cord stimulator placed on 06/05/16.  With her pending surgery, Dr. [name] will not make changes right now.  She will have her continue propranolol 60mg , BID.  She will keep her pending follow up appt with Dr. [name].  The patient will discuss clonazepam refills with her pain management doctor.

## 2016-05-31 ENCOUNTER — Ambulatory Visit: Payer: Self-pay | Admitting: Obstetrics & Gynecology

## 2016-05-31 ENCOUNTER — Telehealth: Payer: Self-pay | Admitting: Obstetrics & Gynecology

## 2016-05-31 NOTE — Telephone Encounter (Signed)
Patient cancelled appointment this morning because she is having back surgery tomorrow. No fee charged.

## 2016-06-04 DIAGNOSIS — G4733 Obstructive sleep apnea (adult) (pediatric): Secondary | ICD-10-CM | POA: Diagnosis not present

## 2016-06-04 DIAGNOSIS — Z885 Allergy status to narcotic agent status: Secondary | ICD-10-CM | POA: Diagnosis not present

## 2016-06-04 DIAGNOSIS — Z79899 Other long term (current) drug therapy: Secondary | ICD-10-CM | POA: Diagnosis not present

## 2016-06-04 DIAGNOSIS — Z462 Encounter for fitting and adjustment of other devices related to nervous system and special senses: Secondary | ICD-10-CM | POA: Diagnosis not present

## 2016-06-04 DIAGNOSIS — M961 Postlaminectomy syndrome, not elsewhere classified: Secondary | ICD-10-CM | POA: Diagnosis not present

## 2016-06-04 DIAGNOSIS — G8929 Other chronic pain: Secondary | ICD-10-CM | POA: Diagnosis not present

## 2016-06-04 DIAGNOSIS — Z86711 Personal history of pulmonary embolism: Secondary | ICD-10-CM | POA: Diagnosis not present

## 2016-06-04 DIAGNOSIS — M5417 Radiculopathy, lumbosacral region: Secondary | ICD-10-CM | POA: Diagnosis not present

## 2016-06-04 DIAGNOSIS — Z91048 Other nonmedicinal substance allergy status: Secondary | ICD-10-CM | POA: Diagnosis not present

## 2016-06-04 DIAGNOSIS — Z7901 Long term (current) use of anticoagulants: Secondary | ICD-10-CM | POA: Diagnosis not present

## 2016-06-04 DIAGNOSIS — Z86718 Personal history of other venous thrombosis and embolism: Secondary | ICD-10-CM | POA: Diagnosis not present

## 2016-06-04 DIAGNOSIS — E11618 Type 2 diabetes mellitus with other diabetic arthropathy: Secondary | ICD-10-CM | POA: Diagnosis not present

## 2016-06-04 DIAGNOSIS — M545 Low back pain: Secondary | ICD-10-CM | POA: Diagnosis not present

## 2016-06-04 DIAGNOSIS — Z91030 Bee allergy status: Secondary | ICD-10-CM | POA: Diagnosis not present

## 2016-06-04 DIAGNOSIS — M5416 Radiculopathy, lumbar region: Secondary | ICD-10-CM | POA: Diagnosis not present

## 2016-06-04 DIAGNOSIS — Z880 Allergy status to penicillin: Secondary | ICD-10-CM | POA: Diagnosis not present

## 2016-06-04 DIAGNOSIS — E079 Disorder of thyroid, unspecified: Secondary | ICD-10-CM | POA: Diagnosis not present

## 2016-06-04 DIAGNOSIS — Z887 Allergy status to serum and vaccine status: Secondary | ICD-10-CM | POA: Diagnosis not present

## 2016-06-04 HISTORY — PX: SPINAL CORD STIMULATOR IMPLANT: SHX2422

## 2016-06-05 DIAGNOSIS — E079 Disorder of thyroid, unspecified: Secondary | ICD-10-CM | POA: Diagnosis not present

## 2016-06-05 DIAGNOSIS — M961 Postlaminectomy syndrome, not elsewhere classified: Secondary | ICD-10-CM | POA: Diagnosis not present

## 2016-06-05 DIAGNOSIS — M545 Low back pain: Secondary | ICD-10-CM | POA: Diagnosis not present

## 2016-06-05 DIAGNOSIS — M5416 Radiculopathy, lumbar region: Secondary | ICD-10-CM | POA: Diagnosis not present

## 2016-06-05 DIAGNOSIS — E11618 Type 2 diabetes mellitus with other diabetic arthropathy: Secondary | ICD-10-CM | POA: Diagnosis not present

## 2016-06-05 DIAGNOSIS — G8929 Other chronic pain: Secondary | ICD-10-CM | POA: Diagnosis not present

## 2016-06-11 DIAGNOSIS — R3915 Urgency of urination: Secondary | ICD-10-CM | POA: Diagnosis not present

## 2016-06-11 DIAGNOSIS — N952 Postmenopausal atrophic vaginitis: Secondary | ICD-10-CM | POA: Diagnosis not present

## 2016-06-11 DIAGNOSIS — N3000 Acute cystitis without hematuria: Secondary | ICD-10-CM | POA: Diagnosis not present

## 2016-06-19 ENCOUNTER — Telehealth: Payer: Self-pay | Admitting: Neurology

## 2016-06-19 NOTE — Telephone Encounter (Signed)
Patient called to see if she could get previously cancelled BOTOX appointment rescheduled, preferably same day/time. Patient also states she was told that she could get treatment for essential tremors in hands. Please call to advise.

## 2016-06-19 NOTE — Telephone Encounter (Signed)
Spoke to patient - she has been placed back on the schedule.  Dr. [name] is aware that she would also like to discuss her tremors.

## 2016-06-20 ENCOUNTER — Encounter: Payer: Self-pay | Admitting: Neurology

## 2016-06-20 ENCOUNTER — Encounter: Payer: Self-pay | Admitting: *Deleted

## 2016-06-20 ENCOUNTER — Ambulatory Visit (INDEPENDENT_AMBULATORY_CARE_PROVIDER_SITE_OTHER): Payer: Medicare Other | Admitting: Neurology

## 2016-06-20 VITALS — BP 112/70 | HR 70 | Ht 65.0 in | Wt 232.0 lb

## 2016-06-20 DIAGNOSIS — R251 Tremor, unspecified: Secondary | ICD-10-CM | POA: Diagnosis not present

## 2016-06-20 DIAGNOSIS — IMO0002 Reserved for concepts with insufficient information to code with codable children: Secondary | ICD-10-CM

## 2016-06-20 DIAGNOSIS — G43709 Chronic migraine without aura, not intractable, without status migrainosus: Secondary | ICD-10-CM

## 2016-06-20 DIAGNOSIS — J383 Other diseases of vocal cords: Secondary | ICD-10-CM

## 2016-06-20 NOTE — Progress Notes (Signed)
**  Botox 100 units x 2 vials, Lot [id number], Exp 12/2018, [place] [id number], office supply//mck,rn**

## 2016-06-20 NOTE — Progress Notes (Signed)
Chief Complaint  Patient presents with  . Migraine    Botox 200 units - office supply  . Tremors    Reports bilateral hand tremors are worse.      PATIENT: Hayley [name] DOB: 1946/04/14  Chief Complaint  Patient presents with  . Migraine    Botox 200 units - office supply  . Tremors    Reports bilateral hand tremors are worse.     HISTORICAL  Hayley [name] is a 70 years old right-handed female, seen in refer by hand surgeon Dr. Daryll Brod, and her primary care PA Flora Lipps for evaluation of neck pain, radiating pain to bilateral shoulder, upper extremity  She has past medical history of spasmodic dysphonia, status post surgical correction, also had a history of hypertension, diabetes, DVT, PE following surgical procedure, most recent was right knee replacement in March 2016, is taking anticoagulation pradaxa, chronic low back pain, previous history of epidural injection for low back pain.  She fell from law mower in June 2016, landed on her back, she was able to got up, finished mowing her lawn, later that day, she noticed worsening low back pain, significant neck pain, radiating pain to her bilateral shoulder, upper extremity, hands, she also noticed bilateral hands paresthesia, burning sensation, subjective weakness, she had worsening urinary urgency, wear pads occasionally, mild unbalanced gait, mild constipation  She was evaluated by [facility] orthopedic surgeon Dr. Rolena Infante for her worsening low back pain, reported MRI of lumbar spine at [place] in July 2016, was told she is not a surgical candidate, she does has multilevel lumbar degenerative disc disease.  She is also concerned about her bilateral hands tremor, getting worse since 2015, most noticeable when she writes, holding utensils, there was no similar family history  UPDATE August 24th 2016: She continues to complain bilateral neck pain, radiating pain to bilateral shoulder, arm, We have reviewed MRI  cervical spine (without) demonstrating: 1. At C3-4: disc bulging and uncovertebral joint hypertrophy and facet hypertrophy with severe left foraminal stenosis 2. At C4-5: disc bulging and uncovertebral joint hypertrophy with severe biforaminal stenosis 3. At C5-6: disc bulging and uncovertebral joint hypertrophy with moderate right and severe left foraminal stenosis 4. At C6-7: disc bulging and uncovertebral joint hypertrophy with moderate biforaminal stenosis 5. No intrinsic or compressive spinal cord lesions  Propanolol 40 mg twice a day has helped her tremor, she denies significant side effect.  UPDATE June 26th 2017: She continue complains of bilateral lower extremity posturing tremor, significant neck pain, radiating pain to bilateral upper extremity, increased unsteady gait, she has fell few times, worsening bilateral lower extremity paresthesia  UPDATE Sept 1 2017: She has worsening low back pain, it gets worse with any movement, difficulty changing bed linen, she is no longer cooking,   She recently had spinal stimulator trial by [facility] by Dr. Delfino Lovett Rauck,,was deemed to be a potential candidate, she did responding well to the spinal stimulator. She complains of midline constant low back pain, radiating down to right hip, right leg and left leg, getting worse with movement, she has bladder urgency, no bowel incontinence.   She had physical therapy, last in July 2017,    She also complains of migraine, seems to get more frequent since March 2017, she is now having headaches 3-5/week, lasting for 4 hours or longer, with light noise sensitivity nauseous,  She used to get Botox injection as migraine prevention from Dr. Samuel Germany, responded very well, last injection was in 2015,  responding well. She is taking Imitrex, Zofran as needed  She is on antibiotics for chronic UTI,  Update June 20 2016: She came in for Botox injection as migraine prevention, also complains  worsening bilateral hands tremor, hope to received EMG guided Botox injection for her right hand tremor.  REVIEW OF SYSTEMS: Full 14 system review of systems performed and notable only for above   ALLERGIES: Allergies  Allergen Reactions  . Bee Venom Anaphylaxis  . Tape Rash    Rash with paper tape  . Etodolac Diarrhea and Nausea And Vomiting  . Morphine Other (See Comments)    Hallucinations  . Other Itching and Swelling  . Poison Ivy Extract  [Poison Ivy Extract] Itching and Swelling  . Propoxyphene Nausea And Vomiting  . Propoxyphene N-Acetaminophen Nausea And Vomiting  . Penicillins Rash    Has patient had a PCN reaction causing immediate rash, facial/tongue/throat swelling, SOB or lightheadedness with hypotension: Yes Has patient had a PCN reaction causing severe rash involving mucus membranes or skin necrosis: No Has patient had a PCN reaction that required hospitalization No Has patient had a PCN reaction occurring within the last 10 years: No If all of the above answers are "NO", then may proceed with Cephalosporin use.     HOME MEDICATIONS: Current Outpatient Prescriptions  Medication Sig Dispense Refill  . albuterol (PROVENTIL HFA;VENTOLIN HFA) 108 (90 BASE) MCG/ACT inhaler Inhale 1 puff into the lungs every 6 (six) hours as needed for wheezing or shortness of breath.    [name] albuterol (PROVENTIL) (2.5 MG/3ML) 0.083% nebulizer solution Take 2.5 mg by nebulization every 6 (six) hours as needed for wheezing or shortness of breath.    . benzonatate (TESSALON) 100 MG capsule Take 100 mg by mouth 3 (three) times daily as needed for cough.    . clonazePAM (KLONOPIN) 0.5 MG tablet Take 0.5 mg by mouth at bedtime.    . cyclobenzaprine (FLEXERIL) 10 MG tablet Take 10 mg by mouth at bedtime.     [name] EPINEPHrine (EPIPEN) 0.3 mg/0.3 mL DEVI Inject 0.3 mg into the muscle once as needed (Allergic reaction).     . fluorometholone (FML) 0.1 % ophthalmic suspension Place 1 drop into both eyes  daily as needed (Eye allergies).   0  . fluticasone (FLONASE) 50 MCG/ACT nasal spray Place 1 spray into both nostrils daily as needed for allergies or rhinitis.    [name] gabapentin (NEURONTIN) 800 MG tablet Take 800-1,600 mg by mouth 2 (two) times daily. 800 in the morning and 1600 at pm    . HYDROcodone-acetaminophen (NORCO/VICODIN) 5-325 MG per tablet Take one to two tablets by mouth every 4 hours as needed for pain. Do not exceed 4gm of Tylenol in 24 hours 360 tablet 0  . levothyroxine (SYNTHROID, LEVOTHROID) 25 MCG tablet Take 25 mcg by mouth every morning.     . methocarbamol (ROBAXIN) 500 MG tablet Take 1 tablet (500 mg total) by mouth every 6 (six) hours as needed for muscle spasms. 90 tablet 0  . mometasone-formoterol (DULERA) 100-5 MCG/ACT AERO Inhale 2 puffs into the lungs 2 (two) times daily.    [name] nystatin (MYCOSTATIN) 100000 UNIT/ML suspension Take 5 mLs by mouth daily as needed (Yeast).    . ondansetron (ZOFRAN) 4 MG tablet Take 4 mg by mouth every 8 (eight) hours as needed for nausea or vomiting.    . ondansetron (ZOFRAN-ODT) 8 MG disintegrating tablet Take 8 mg by mouth every 8 (eight) hours as needed for nausea or vomiting.    [name]  oxyCODONE (OXY IR/ROXICODONE) 5 MG immediate release tablet Take 1-2 tablets (5-10 mg total) by mouth every 3 (three) hours as needed for moderate pain or severe pain. 90 tablet 0  . pantoprazole (PROTONIX) 40 MG tablet Take 40 mg by mouth daily.    . Phenazopyridine HCl (AZO TABS PO) Take 1 tablet by mouth daily as needed (UTI).    . primidone (MYSOLINE) 50 MG tablet Take 50 mg by mouth at bedtime.    . promethazine (PHENERGAN) 25 MG tablet Take 25 mg by mouth every 6 (six) hours as needed for nausea or vomiting.    . Rivaroxaban (XARELTO) 15 MG TABS tablet Take 1 tablet (15 mg total) by mouth 2 (two) times daily with a meal. 60 tablet 5  . rizatriptan (MAXALT) 10 MG tablet Take 10 mg by mouth as needed for migraine (migraine). May repeat in 2 hours if needed      . sertraline (ZOLOFT) 100 MG tablet Take 100 mg by mouth daily.    . sitaGLIPtin (JANUVIA) 100 MG tablet Take 100 mg by mouth every evening.     . SUMAtriptan (IMITREX) 6 MG/0.5ML SOLN injection Inject 6 mg into the skin every 2 (two) hours as needed for migraine or headache. May repeat in 2 hours if headache persists or recurs.    . valACYclovir (VALTREX) 1000 MG tablet Take 2,000 mg by mouth 2 (two) times daily as needed (Fever blisters).   2     PAST MEDICAL HISTORY: Past Medical History:  Diagnosis Date  . Anemia   . Anxiety   . Bronchitis    hx of   . Clotting disorder ([place]) 02-17-14   S/p appendectomy-developed Pulmonary emboli-tx. warfarin-d/c 1 yr ago.  . Colon polyps   . Depression   . Diabetes mellitus without complication ([place])    "borderline"- oral med  . Disorder of vocal cords    spasmotic dysphonia ,02-17-14 has" whispery voice-low tone"  . Hyperthyroidism   . Kidney stones   . Migraines   . Nausea Nov 15, 2014   cycle of migraine headaches and nausea  . Neuromuscular disorder ([place])    hands and throat"spasmodic dysphonia", tremors - thimb and forefinger   . Obesity, Class III, BMI 40-49.9 (morbid obesity) ([place]) 04/27/2010   02-17-14 reports some weight loss- intentional  . Osteoarthritis    in back(chronic pain)  . Pelvic pain    02-17-14 "states thinks its back related on left groin"  . Pulmonary emboli [facility])    s/p Appendectomy '13 [facility]  . Reflux   . Right knee pain   . Sleep apnea    cpap - settings at 4   . Toxic goiter     PAST SURGICAL HISTORY: Past Surgical History:  Procedure Laterality Date  . ABDOMINAL HYSTERECTOMY    . APPENDECTOMY     3'13 [facility] s/p developed  Pulmonary Emboli  . BOTOX INJECTION     for migranes  . CATARACT EXTRACTION, BILATERAL Bilateral 03/2013   . CHOLECYSTECTOMY    . HAND SURGERY  2004   both hands  . KIDNEY STONE SURGERY    . KNEE ARTHROSCOPY  1998   right  . LAPAROSCOPIC APPENDECTOMY  11/27/2011   Procedure:  APPENDECTOMY LAPAROSCOPIC;  Surgeon: Adin Hector, MD;  Location: [facility];  Service: General;  Laterality: N/A;  . TOTAL KNEE ARTHROPLASTY Right 12/06/2014   Procedure: RIGHT TOTAL KNEE ARTHROPLASTY;  Surgeon: Gaynelle Arabian, MD;  Location: [facility];  Service: Orthopedics;  Laterality:  Right;  [name] TOTAL KNEE REVISION Left 02/24/2014   Procedure: LEFT TOTAL KNEE REVISION;  Surgeon: Gearlean Alf, MD;  Location: [facility];  Service: Orthopedics;  Laterality: Left;  . vocal cord surgery  02-17-14   01-12-14 done in [place] [facility](s/p selective denervation/reinnervation recurrent laryngeal nerve surgery)  . zenkers diverticulum      FAMILY HISTORY: Family History  Problem Relation Age of Onset  . Heart disease Mother   . Pneumonia Father   . Cirrhosis Father   . Hypertension Father   . Alcoholism Father   . Heart attack Paternal Grandfather   . Heart attack Maternal Grandfather   . Heart attack Paternal Grandmother   . Heart attack Maternal Grandmother     SOCIAL HISTORY:  Social History   Social History  . Marital status: Married    Spouse name: N/A  . Number of children: 5  . Years of education: 39   Occupational History  . Retired    Social History Main Topics  . Smoking status: Former Smoker    Quit date: 01/03/1982  . Smokeless tobacco: Never Used  . Alcohol use No  . Drug use: No  . Sexual activity: Not on file   Other Topics Concern  . Not on file   Social History Narrative   Lives at home with husband.   Right-handed.   2 cups caffeine per day.     PHYSICAL EXAM   Vitals:   06/20/16 1428  Height: 5\' 5"  (1.651 m)    Not recorded      There is no height or weight on file to calculate BMI.  PHYSICAL EXAMNIATION:  Gen: NAD, conversant, well nourised, obese, well groomed                     Cardiovascular: Regular rate rhythm, no peripheral edema, warm, nontender. Eyes: Conjunctivae clear without exudates or hemorrhage Neck: Supple, no carotid  bruise. Pulmonary: Clear to auscultation bilaterally   NEUROLOGICAL EXAM:  MENTAL STATUS: Speech:    Mild dysphonic speech; fluent and spontaneous with normal comprehension.  Cognition:     Orientation to time, place and person     Normal recent and remote memory     Normal Attention span and concentration     Normal Language, naming, repeating,spontaneous speech     Fund of knowledge   CRANIAL NERVES: CN II: Visual fields are full to confrontation. Fundoscopic exam is normal with sharp discs and no vascular changes. Pupils are round equal and briskly reactive to light. CN III, IV, VI: extraocular movement are normal. No ptosis. CN V: Facial sensation is intact to pinprick in all 3 divisions bilaterally. Corneal responses are intact.  CN VII: Face is symmetric with normal eye closure and smile. CN VIII: Hearing is normal to rubbing fingers CN IX, X: Palate elevates symmetrically. Phonation is normal. CN XI: Head turning and shoulder shrug are intact CN XII: Tongue is midline with normal movements and no atrophy.  MOTOR: Mild bilateral hands postural tremor, normal muscle tone, bulk, strength   REFLEXES: Reflexes are 2+ and symmetric at the biceps, triceps, knees, and ankles. Plantar responses are flexor.  SENSORY: Intact to light touch, pinprick, position sense, and vibration sense are intact in fingers and toes.  COORDINATION: Rapid alternating movements and fine finger movements are intact. There is no dysmetria on finger-to-nose and heel-knee-shin.    GAIT/STANCE: Posture is normal. Gait is cautious, mildly unsteady  DIAGNOSTIC DATA (LABS, IMAGING, TESTING) -  I reviewed patient records, labs, notes, testing and imaging myself where available.   ASSESSMENT AND PLAN  Hayley [name] is a 70 y.o. female   Left cervical radiculopathy:  We have reviewed MRI cervical spine 2016 : Multilevel degenerative disc disease, severe foraminal stenosis at left C3-4, bilateral C4-5,  left C5-6,:  I reviewed her gabapentin at lower dose 300 mg 2 tablets 3 times a day   Essential Tremor:  Tried primidone in the past without significant improvement, primidone has the potential interaction with her polypharmacy  Keep propanolol 60 mg twice a day  Chronic anticoagulation:  History of DVT, PE, following surgical procedure, most recent one was March 2016 following her right knee replacement  On chronic pradaxa treatment  Worsening bilateral lower extremity paresthesia, gait abnormality, falling episodes  No evidence of large fiber peripheral neuropathy lumbar sacral radiculopathy on today's electrodiagnostic study,   I have suggested her continue swimming exercise,  she is planning on to have spinal cord stimulator placement  Worsening chronic migraine headaches  Previously responded well to Botox injection, BOTOX injection was performed according to protocol by [employer]. 100 units of BOTOX was dissolved into 2 cc NS.    Total of 155 units,  Corrugator 2 sites, 10 units Procerus 1 site, 5 unit Frontalis 4 sites,  20 units, Temporalis 8 sites,  40 units  Occipitalis 6 sites, 30 units Cervical Paraspinal, 4 sites, 20 units Trapezius, 6 sites, 30 units  Essential tremor 45 units under EMG guidance was injected into right arm for right hand tremor  Right pronator tere 10 units Right flexor  Digitorum profoundi 10 units Right flexor carpi ulnaris 10 units Right flexor digitorum superficialis 5 units  Right flexor carpi radialis 5 units Right pronator quadrator 5 units  Patient tolerate the injection well.  Will return for repeat injection in 3 months.   Marcial Pacas, M.D. Ph.D.  [facility] [address], [place], [place] [address] Ph: [phone] Fax: [phone]  CC: To Dr. Daryll Brod, Flora Lipps

## 2016-06-21 ENCOUNTER — Ambulatory Visit: Payer: Medicare Other | Admitting: Neurology

## 2016-06-21 DIAGNOSIS — G43709 Chronic migraine without aura, not intractable, without status migrainosus: Secondary | ICD-10-CM | POA: Diagnosis not present

## 2016-06-21 DIAGNOSIS — R251 Tremor, unspecified: Secondary | ICD-10-CM

## 2016-06-21 MED ORDER — ONABOTULINUMTOXINA 100 UNITS IJ SOLR
200.0000 [IU] | Freq: Once | INTRAMUSCULAR | Status: AC
  Administered 2016-06-21: 200 [IU] via INTRAMUSCULAR

## 2016-06-27 ENCOUNTER — Telehealth: Payer: Self-pay | Admitting: Neurology

## 2016-06-27 NOTE — Telephone Encounter (Addendum)
Called patient prior to lunch.  She is feeling some better - no migraine today but still having nausea.  She is using ondansetron.  She is not running a fever but has chills.  She has been using sumatriptan and Botox for some time now and Dr. [name] does not feel these symptoms are related to her medications.  Instructed her to contact her PCP, if she continues to feel poorly.  Also, instructed her to stay well hydrated.

## 2016-06-27 NOTE — Telephone Encounter (Signed)
Pt called in stating she is not feeling to good. She had a migraine yesterday and took  SUMAtriptan. She is feeling lethargic, very weak, nauseous , chills, and feels warm. She has not taken her temp. She does not know if this is from the botox last week. Please call and advise [phone]

## 2016-06-28 ENCOUNTER — Other Ambulatory Visit: Payer: Self-pay | Admitting: Oncology

## 2016-07-04 ENCOUNTER — Telehealth: Payer: Self-pay

## 2016-07-04 ENCOUNTER — Other Ambulatory Visit: Payer: Self-pay

## 2016-07-04 ENCOUNTER — Other Ambulatory Visit (HOSPITAL_BASED_OUTPATIENT_CLINIC_OR_DEPARTMENT_OTHER): Payer: Medicare Other

## 2016-07-04 ENCOUNTER — Other Ambulatory Visit: Payer: Medicare Other

## 2016-07-04 ENCOUNTER — Ambulatory Visit (HOSPITAL_BASED_OUTPATIENT_CLINIC_OR_DEPARTMENT_OTHER): Payer: Medicare Other | Admitting: Oncology

## 2016-07-04 VITALS — BP 108/78 | HR 75 | Temp 98.4°F | Resp 18 | Ht 65.0 in | Wt 232.5 lb

## 2016-07-04 DIAGNOSIS — D689 Coagulation defect, unspecified: Secondary | ICD-10-CM

## 2016-07-04 DIAGNOSIS — I2609 Other pulmonary embolism with acute cor pulmonale: Secondary | ICD-10-CM | POA: Diagnosis not present

## 2016-07-04 DIAGNOSIS — E119 Type 2 diabetes mellitus without complications: Secondary | ICD-10-CM

## 2016-07-04 DIAGNOSIS — Z86718 Personal history of other venous thrombosis and embolism: Secondary | ICD-10-CM

## 2016-07-04 DIAGNOSIS — M5441 Lumbago with sciatica, right side: Secondary | ICD-10-CM

## 2016-07-04 DIAGNOSIS — E669 Obesity, unspecified: Secondary | ICD-10-CM

## 2016-07-04 DIAGNOSIS — S0990XS Unspecified injury of head, sequela: Secondary | ICD-10-CM | POA: Diagnosis not present

## 2016-07-04 LAB — COMPREHENSIVE METABOLIC PANEL
ALT: 8 U/L (ref 0–55)
AST: 14 U/L (ref 5–34)
Albumin: 3.5 g/dL (ref 3.5–5.0)
Alkaline Phosphatase: 96 U/L (ref 40–150)
Anion Gap: 9 mEq/L (ref 3–11)
BUN: 9.2 mg/dL (ref 7.0–26.0)
CO2: 29 mEq/L (ref 22–29)
Calcium: 9.3 mg/dL (ref 8.4–10.4)
Chloride: 104 mEq/L (ref 98–109)
Creatinine: 0.9 mg/dL (ref 0.6–1.1)
EGFR: 62 mL/min/{1.73_m2} — ABNORMAL LOW (ref >90)
Glucose: 149 mg/dl — ABNORMAL HIGH (ref 70–140)
Potassium: 4.4 mEq/L (ref 3.5–5.1)
Sodium: 142 mEq/L (ref 136–145)
Total Bilirubin: 0.23 mg/dL (ref 0.20–1.20)
Total Protein: 7.7 g/dL (ref 6.4–8.3)

## 2016-07-04 LAB — CBC WITH DIFFERENTIAL/PLATELET
BASO%: 0.3 % (ref 0.0–2.0)
Basophils Absolute: 0 10*3/uL (ref 0.0–0.1)
EOS%: 4.5 % (ref 0.0–7.0)
Eosinophils Absolute: 0.3 10*3/uL (ref 0.0–0.5)
HCT: 43.9 % (ref 34.8–46.6)
HGB: 13.9 g/dL (ref 11.6–15.9)
LYMPH%: 20.1 % (ref 14.0–49.7)
MCH: 27.1 pg (ref 25.1–34.0)
MCHC: 31.7 g/dL (ref 31.5–36.0)
MCV: 85.7 fL (ref 79.5–101.0)
MONO#: 0.6 10*3/uL (ref 0.1–0.9)
MONO%: 8.3 % (ref 0.0–14.0)
NEUT#: 4.6 10*3/uL (ref 1.5–6.5)
NEUT%: 66.8 % (ref 38.4–76.8)
Platelets: 255 10*3/uL (ref 145–400)
RBC: 5.12 10*6/uL (ref 3.70–5.45)
RDW: 15.1 % — ABNORMAL HIGH (ref 11.2–14.5)
WBC: 6.8 10*3/uL (ref 3.9–10.3)
lymph#: 1.4 10*3/uL (ref 0.9–3.3)

## 2016-07-04 MED ORDER — APIXABAN 2.5 MG PO TABS: 2.5000 mg | ORAL_TABLET | Freq: Two times a day (BID) | ORAL | 3 refills | Status: DC

## 2016-07-04 NOTE — Progress Notes (Signed)
Thank you Hayley. [name]  Telephone:([phone] [phone]) [phone]     ID: Hayley [name] DOB: [birth year]  MR#: [id number]  [id number]  Patient Care Team: Hayley Commons, FNP as PCP - General (Internal Medicine) Hayley Craver, MD as Consulting Physician (Gastroenterology) Hayley Brod, MD as Consulting Physician (Orthopedic Surgery) PCP: Hayley [place], FNP SU: Hayley Boston MD OTHER MD: Hayley Arabian MD  CHIEF COMPLAINT: coagulopathy  CURRENT TREATMENT: Apixaban   HISTORY OF PRESENT ILLNESS: From the 06/05/2015 consult note:  "Hayley [name] underwent laparoscopic appendectomy under Hayley [name] 11/27/2014 for what was likely a perforated appendix with phlegmon formation. On 12/06/2014 she was noted to be hypoxic and a chest CT/angio was obtained, showing multiple small bilateral PEs. LE dopplers that day were negative. UE dopplers the next day showed only a R cephalic v superficial thrombus.   The patient was started on warfarin, continued for one year. Repeat CT/angio 12/22/2013 showed resolution of the earlier clot.  On 12/06/2014 the patient underwent R TKR. On 12/09/2014 she was noted to be SOB and CT/angio documented bilateral PEs. She was started on Xarelto, later changed by her PCP to Pradaxa. Repeat CT angios 05/15/2014 and 06/03/2015 showed resolution of the earlier clots."  Her subsequent history is as detailed below  INTERVAL HISTORY: Hayley [name] returns today for follow-up of her history of repeated DVTs/PEs, accompanied by her Hayley. She continues on apixaban, which she tolerates well. She has had no bleeding complications from this. She is able to obtain it at a reasonable price.  REVIEW OF SYSTEMS: Hayley [name] is "not as well as I would like to be". She has no energy. She feels very uncertain as she walks. She has had 2 or 3 falls, usually when trying to climb steps. She now has a spinal stimulator in place and that seems to be helping. She describes herself as  anxious and angry. She does have a walker at home and she says she does use it. She is not on any particular diet. She requested a hemoglobin A1c today. She cannot tell me how your sugars have been doing at home. A detailed review of systems today was otherwise stable.  PAST MEDICAL HISTORY: Past Medical History:  Diagnosis Date  . Anemia   . Anxiety   . Bronchitis    hx of   . Clotting disorder ([place]) 02-17-14   S/p appendectomy-developed Pulmonary emboli-tx. warfarin-d/c 1 yr ago.  . Colon polyps   . Depression   . Diabetes mellitus without complication ([place])    "borderline"- oral med  . Disorder of vocal cords    spasmotic dysphonia ,02-17-14 has" whispery voice-low tone"  . Hyperthyroidism   . Kidney stones   . Migraines   . Nausea Nov 15, 2014   cycle of migraine headaches and nausea  . Neuromuscular disorder ([place])    hands and throat"spasmodic dysphonia", tremors - thimb and forefinger   . Obesity, Class III, BMI 40-49.9 (morbid obesity) ([place]) 04/27/2010   02-17-14 reports some weight loss- intentional  . Osteoarthritis    in back(chronic pain)  . Pelvic pain    02-17-14 "states thinks its back related on left groin"  . Pulmonary emboli [facility])    s/p Appendectomy '13 [facility]  . Reflux   . Right knee pain   . Sleep apnea    cpap - settings at 4   . Toxic goiter     PAST SURGICAL HISTORY: Past Surgical History:  Procedure Laterality Date  . ABDOMINAL  HYSTERECTOMY    . APPENDECTOMY     3'13 [facility] s/p developed  Pulmonary Emboli  . BOTOX INJECTION     for migranes  . CATARACT EXTRACTION, BILATERAL Bilateral 03/2013   . CHOLECYSTECTOMY    . HAND SURGERY  2004   both hands  . KIDNEY STONE SURGERY    . KNEE ARTHROSCOPY  1998   right  . LAPAROSCOPIC APPENDECTOMY  11/27/2011   Procedure: APPENDECTOMY LAPAROSCOPIC;  Surgeon: Hayley Hector, MD;  Location: [facility] ORS;  Service: General;  Laterality: N/A;  . TOTAL KNEE ARTHROPLASTY Right 12/06/2014   Procedure: RIGHT TOTAL KNEE  ARTHROPLASTY;  Surgeon: Hayley Arabian, MD;  Location: [facility];  Service: Orthopedics;  Laterality: Right;  . TOTAL KNEE REVISION Left 02/24/2014   Procedure: LEFT TOTAL KNEE REVISION;  Surgeon: Hayley Alf, MD;  Location: [facility];  Service: Orthopedics;  Laterality: Left;  . vocal cord surgery  02-17-14   01-12-14 done in [place] [facility](s/p selective denervation/reinnervation recurrent laryngeal nerve surgery)  . zenkers diverticulum      FAMILY HISTORY Family History  Problem Relation Age of Onset  . Heart disease Mother   . Pneumonia Father   . Cirrhosis Father   . Hypertension Father   . Alcoholism Father   . Heart attack Paternal Grandfather   . Heart attack Maternal Grandfather   . Heart attack Paternal Grandmother   . Heart attack Maternal Grandmother     GYNECOLOGIC HISTORY:  Patient's last menstrual period was 09/18/1979 (approximate).  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 The patient has 2 children from an earlier marriage, Hayley [name], who lives in [name], and a daughter Hayley [name], who manages a Building surveyor. The patient has one grandchild of her own, 26 with Arnell [name] (who has 3 children from a prior marriage)     HEALTH MAINTENANCE: Social History  Substance Use Topics  . Smoking status: Former Smoker    Quit date: 01/03/1982  . Smokeless tobacco: Never Used  . Alcohol use No     Colonoscopy:  PAP:  Bone density:  Lipid panel:  Allergies  Allergen Reactions  . Bee Venom Anaphylaxis  . Tape Rash    Rash with paper tape  . Etodolac Diarrhea and Nausea And Vomiting  . Morphine Other (See Comments)    Hallucinations  . Other Itching and Swelling  . Poison Ivy Extract  [Poison Ivy Extract] Itching and Swelling  . Propoxyphene Nausea And Vomiting  . Propoxyphene N-Acetaminophen Nausea And Vomiting  . Penicillins Rash    Has patient had a PCN reaction  causing immediate rash, facial/tongue/throat swelling, SOB or lightheadedness with hypotension: Yes Has patient had a PCN reaction causing severe rash involving mucus membranes or skin necrosis: No Has patient had a PCN reaction that required hospitalization No Has patient had a PCN reaction occurring within the last 10 years: No If all of the above answers are "NO", then may proceed with Cephalosporin use.     Current Outpatient Prescriptions  Medication Sig Dispense Refill  . albuterol (PROVENTIL HFA;VENTOLIN HFA) 108 (90 BASE) MCG/ACT inhaler Inhale 1 puff into the lungs every 6 (six) hours as needed for wheezing or shortness of breath.    [name] albuterol (PROVENTIL) (2.5 MG/3ML) 0.083% nebulizer solution Take 2.5 mg by nebulization every 6 (six) hours as needed for wheezing or shortness of breath. Reported on 01/10/2016    .  apixaban (ELIQUIS) 2.5 MG TABS tablet Take 1 tablet (2.5 mg total) by mouth 2 (two) times daily. X 7 days, then 1 tablet (5 mg) two times daily starting 03/31/16 90 tablet 3  . aspirin EC 81 MG tablet Take 81 mg by mouth daily.    . benzonatate (TESSALON) 100 MG capsule Take 100 mg by mouth 2 (two) times daily as needed for cough.    [name] BIOTIN PO Take 1 capsule by mouth daily.    . Botulinum Toxin Type A (BOTOX) 200 units SOLR Inject as directed every 3 (three) months.    [name] buPROPion (WELLBUTRIN XL) 150 MG 24 hr tablet Take 150 mg by mouth every morning.  0  . CHERATUSSIN AC 100-10 MG/5ML syrup Take 5-10 mLs by mouth every 8 (eight) hours as needed for cough.  1  . cholecalciferol (VITAMIN D) 1000 units tablet Take 1,000 Units by mouth daily.    . clonazePAM (KLONOPIN) 0.5 MG tablet Take 1 tablet (0.5 mg total) by mouth at bedtime. 30 tablet 0  . cyanocobalamin (,VITAMIN B-12,) 1000 MCG/ML injection Inject 1 mL into the skin every 30 (thirty) days.  11  . cyclobenzaprine (FLEXERIL) 10 MG tablet Take 1 tablet (10 mg total) by mouth at bedtime. 90 tablet 4  . diphenhydrAMINE  (BENADRYL) 25 MG tablet Take 50 mg by mouth every 6 (six) hours as needed for itching.    . DULoxetine (CYMBALTA) 60 MG capsule Take 1 capsule (60 mg total) by mouth every morning. 90 capsule 4  . gabapentin (NEURONTIN) 300 MG capsule Take 2 capsules (600 mg total) by mouth 3 (three) times daily. 540 capsule 4  . ibuprofen (ADVIL,MOTRIN) 200 MG tablet Take 800 mg by mouth every 8 (eight) hours as needed for mild pain.    [name] levothyroxine (SYNTHROID, LEVOTHROID) 25 MCG tablet Take 25 mcg by mouth every morning.     . mometasone-formoterol (DULERA) 100-5 MCG/ACT AERO Inhale 2 puffs into the lungs 2 (two) times daily.    . nitrofurantoin, macrocrystal-monohydrate, (MACROBID) 100 MG capsule Take 1 capsule (100 mg total) by mouth 2 (two) times daily. 14 capsule 0  . ondansetron (ZOFRAN-ODT) 8 MG disintegrating tablet Take 1 tablet (8 mg total) by mouth every 8 (eight) hours as needed for nausea or vomiting. 60 tablet 4  . permethrin (ELIMITE) 5 % cream     . Probiotic Product (ACIDOPHILUS/GOAT MILK) CAPS Take by mouth daily.    . propranolol (INDERAL) 60 MG tablet Take 1 tablet (60 mg total) by mouth 2 (two) times daily. 180 tablet 3  . sitaGLIPtin (JANUVIA) 100 MG tablet Take 100 mg by mouth every evening. Reported on 01/10/2016    . SUMAtriptan (IMITREX) 25 MG tablet Take 1 tablet (25 mg total) by mouth every 2 (two) hours as needed for migraine. May repeat in 2 hours if headache persists or recurs. 30 tablet 3  . SUMAtriptan Succinate Refill 4 MG/0.5ML SOCT Inject 4 mg into the skin as needed. 5 mL 6  . valACYclovir (VALTREX) 1000 MG tablet Take 2,000 mg by mouth 2 (two) times daily as needed (Fever blisters). Reported on 01/10/2016  2  . vitamin B-12 500 MCG tablet Take 1 tablet (500 mcg total) by mouth daily. 30 tablet 0   No current facility-administered medications for this visit.     OBJECTIVE: middle-aged White woman Who appears chronically L Vitals:   07/04/16 1508  BP: 108/78  Pulse: 75    Resp: 18  Temp: 98.4 F (  36.9 C)     Body mass index is 38.69 kg/m.    ECOG FS:2 - Symptomatic, <50% confined to bed  Sclerae unicteric, EOMs intact Oropharynx clear and moist No cervical or supraclavicular adenopathy Lungs no rales or rhonchi Heart regular rate and rhythm Abd soft, obese, nontender, positive bowel sounds MSK no focal spinal tenderness to vigorous percussion Neuro: nonfocal, well oriented, appropriate affect Breasts: Deferred    LAB RESULTS: CMP     Component Value Date/Time   NA 142 07/04/2016 1427   K 4.4 07/04/2016 1427   CL 101 03/22/2016 1303   CO2 29 07/04/2016 1427   GLUCOSE 149 (H) 07/04/2016 1427   BUN 9.2 07/04/2016 1427   CREATININE 0.9 07/04/2016 1427   CALCIUM 9.3 07/04/2016 1427   PROT 7.7 07/04/2016 1427   ALBUMIN 3.5 07/04/2016 1427   AST 14 07/04/2016 1427   ALT 8 07/04/2016 1427   ALKPHOS 96 07/04/2016 1427   BILITOT 0.23 07/04/2016 1427   GFRNONAA >60 03/22/2016 1258   GFRAA >60 03/22/2016 1258    INo results found for: SPEP, UPEP  Lab Results  Component Value Date   WBC 6.8 07/04/2016   NEUTROABS 4.6 07/04/2016   HGB 13.9 07/04/2016   HCT 43.9 07/04/2016   MCV 85.7 07/04/2016   PLT 255 07/04/2016      Chemistry      Component Value Date/Time   NA 142 07/04/2016 1427   K 4.4 07/04/2016 1427   CL 101 03/22/2016 1303   CO2 29 07/04/2016 1427   BUN 9.2 07/04/2016 1427   CREATININE 0.9 07/04/2016 1427      Component Value Date/Time   CALCIUM 9.3 07/04/2016 1427   ALKPHOS 96 07/04/2016 1427   AST 14 07/04/2016 1427   ALT 8 07/04/2016 1427   BILITOT 0.23 07/04/2016 1427       No results found for: LABCA2  No components found for: LABCA125  No results for input(s): INR in the last 168 hours.  Urinalysis    Component Value Date/Time   COLORURINE YELLOW 03/22/2016 1530   APPEARANCEUR CLOUDY (A) 03/22/2016 1530   APPEARANCEUR CLOUDY 12/19/2014 0730   LABSPEC 1.007 03/22/2016 1530   LABSPEC 1.025  12/19/2014 0730   PHURINE 6.5 03/22/2016 1530   GLUCOSEU NEGATIVE 03/22/2016 1530   GLUCOSEU see comment 12/19/2014 0730   HGBUR TRACE (A) 03/22/2016 1530   BILIRUBINUR n 05/08/2016 1506   BILIRUBINUR see comment 12/19/2014 0730   KETONESUR NEGATIVE 03/22/2016 1530   PROTEINUR trace 05/08/2016 1506   PROTEINUR NEGATIVE 03/22/2016 1530   UROBILINOGEN negative 05/08/2016 1506   UROBILINOGEN 1.0 11/30/2014 1215   NITRITE n 05/08/2016 1506   NITRITE POSITIVE (A) 03/22/2016 1530   LEUKOCYTESUR large (3+) (A) 05/08/2016 1506   LEUKOCYTESUR see comment 12/19/2014 0730  Results for LILLEIGH, CRYDER (MRN [id number]) as of 04/08/2016 15:06  Ref. Range 10/24/2015 10:00 01/10/2016 13:47 02/10/2016 11:43 03/12/2016 15:13 04/02/2016 13:39  D-dimer Latest Ref Range: 0.00 - 0.49 mg/L FEU 1.02 (H) 0.48 0.68 (H) 0.55 (H) 0.72 (H)    STUDIES: No results found.  ASSESSMENT: 69 y.o. [place] woman with a history of recurrent PE as follows:  (1) multiple low-volume PE noted on chest CT/angio 12/06/2014, 10 days after laparoscopic appendectomy for a perforated appendix with phlegmon (a) bilateral LE dopplers 12/06/2014 negative (b) bilateral UE dopplers 12/07/2014 showed superficial R cephalic v clot (c) treatment: warfarin x one year, monitored through [place] (d) repeat CT angio 12/23/2015 showed prior  PEs cleared; also CT/angio 05/15/2014 negative  (2) bilateral PEs documented by Ct/angio03/24/2016, day 3 post R total knee (a) started on rivaroxaban/ Xarelto, switched by PCP to dabigratan/ Pradaxa, discontinued May 2017 because of cost issues (b) repeat CT/angio 06/03/2015 shows resolution of prior PE  (c) started on apixaban 5 mg daily with symptoms suggestive of stroke, but negative workup, July 2017  (d) apixaban dose decreased to 2.5 mg daily given history of falls but persistently elevated d-dimer  (3) hypercoagulable  panel 06/05/2015 including a homocystine level, a screen for the lupus anticoagulant, beta 2 glycoprotein studies, anti-cardiolipin antibodies, and studies for the factor V Leiden and prothrombin gene mutation found no actionable abnormalities  (a) the anticardiolipin IgM antibody 06/05/2015 was 34 ("low medium positive"), repeat 07/18/2015 was 38  PLAN: Hayley [name]'s health continues to deteriorate. The clotting issue is only 1 of many overlapping problems and basically she is now morbidly obese and inactive so her risk of clots quite aside from her history is high.  On the other hand she tells me she is unstable, even though she does use a walker at home. She has fallen at least twice in the last 6 months.  Taking all this in hand I think the best move it is to drop her apixaban dose to 2.5 mg daily, but not discontinued altogether. I am afraid if we stop it she will develop a significant clot which could take her life.  She was again cautioned regarding bleeding problems and particularly fall avoidance. I strongly urged her to start an exercise program and [facility] [place] [facility] If she only walks 5 minutes at a time around the house and does that approximately 6 times a day then she will have walk at least 30 minutes a day and that would be a start.  In addition she needs to improve her diet. I simply suggested she eliminate carbohydrates.  If she is able to make these changes I am hopeful her overall health condition will improve. Otherwise I do believe she will continue to deteriorate. She will follow-up on these issues with her primary care physician.  I'm going to check her labs again in 3 months. She will let me know if any problems develop before then.   Chauncey Cruel, MD   07/05/2016 7:20 AM

## 2016-07-04 NOTE — Telephone Encounter (Signed)
Pt left paperwork on bench outside when she went home. Paperwork is in an envelope at [employer] ready for pick up. LVM with this information on cell phone.

## 2016-07-05 ENCOUNTER — Encounter: Payer: Self-pay | Admitting: Oncology

## 2016-07-05 LAB — D-DIMER, QUANTITATIVE: D-DIMER: 1.39 mg/L FEU — ABNORMAL HIGH (ref 0.00–0.49)

## 2016-07-05 LAB — HEMOGLOBIN A1C
Est. average glucose Bld gHb Est-mCnc: 171 mg/dL
Hemoglobin A1c: 7.6 % — ABNORMAL HIGH (ref 4.8–5.6)

## 2016-07-06 LAB — CARDIOLIPIN ANTIBODIES, IGG, IGM, IGA
Anticardiolipin Ab,IgA,Qn: <9 APL U/mL (ref 0–11)
Anticardiolipin Ab,IgG,Qn: <9 GPL U/mL (ref 0–14)
Anticardiolipin Ab,IgM,Qn: 33 MPL U/mL — ABNORMAL HIGH (ref 0–12)

## 2016-07-11 ENCOUNTER — Ambulatory Visit: Payer: Self-pay | Admitting: Oncology

## 2016-07-12 DIAGNOSIS — Z86711 Personal history of pulmonary embolism: Secondary | ICD-10-CM | POA: Diagnosis not present

## 2016-07-12 DIAGNOSIS — R05 Cough: Secondary | ICD-10-CM | POA: Diagnosis not present

## 2016-07-30 DIAGNOSIS — M545 Low back pain: Secondary | ICD-10-CM | POA: Diagnosis not present

## 2016-07-30 DIAGNOSIS — M6281 Muscle weakness (generalized): Secondary | ICD-10-CM | POA: Diagnosis not present

## 2016-08-15 ENCOUNTER — Telehealth: Payer: Self-pay | Admitting: Neurology

## 2016-08-15 NOTE — Telephone Encounter (Signed)
Patient is calling. Hayley [name] tremors have gotten worse for the past 3-4 years and is not sure if Dr. [name] has been treating Hayley [name] for this. Please call and discuss.

## 2016-08-15 NOTE — Telephone Encounter (Signed)
Dr. [name] has discussed tremors with pt in the past.  She received Botox injections into her right hand at her last appt in October 2017.  Pt feels tremors have been steadily worsening over the last 3-4 years.  She has a pending appt for Botox (for migraines) in January and has opted to keep that time for further discussion.

## 2016-09-26 ENCOUNTER — Ambulatory Visit (INDEPENDENT_AMBULATORY_CARE_PROVIDER_SITE_OTHER): Payer: Medicare Other | Admitting: Neurology

## 2016-09-26 ENCOUNTER — Encounter: Payer: Self-pay | Admitting: Neurology

## 2016-09-26 ENCOUNTER — Telehealth: Payer: Self-pay | Admitting: Neurology

## 2016-09-26 VITALS — BP 113/72 | HR 84 | Ht 65.0 in | Wt 231.2 lb

## 2016-09-26 DIAGNOSIS — M542 Cervicalgia: Secondary | ICD-10-CM | POA: Insufficient documentation

## 2016-09-26 DIAGNOSIS — M25512 Pain in left shoulder: Secondary | ICD-10-CM | POA: Diagnosis not present

## 2016-09-26 DIAGNOSIS — R251 Tremor, unspecified: Secondary | ICD-10-CM

## 2016-09-26 DIAGNOSIS — S40012S Contusion of left shoulder, sequela: Secondary | ICD-10-CM | POA: Diagnosis not present

## 2016-09-26 DIAGNOSIS — IMO0002 Reserved for concepts with insufficient information to code with codable children: Secondary | ICD-10-CM

## 2016-09-26 DIAGNOSIS — G43709 Chronic migraine without aura, not intractable, without status migrainosus: Secondary | ICD-10-CM | POA: Diagnosis not present

## 2016-09-26 DIAGNOSIS — S161XXS Strain of muscle, fascia and tendon at neck level, sequela: Secondary | ICD-10-CM | POA: Diagnosis not present

## 2016-09-26 MED ORDER — ONABOTULINUMTOXINA 100 UNITS IJ SOLR
200.0000 [IU] | Freq: Once | INTRAMUSCULAR | Status: AC
  Administered 2016-09-26: 200 [IU] via INTRAMUSCULAR

## 2016-09-26 NOTE — Telephone Encounter (Signed)
PT NEEDS 3 MONTH BOTOX FOR YAN. CB

## 2016-09-26 NOTE — Progress Notes (Signed)
**  Botox 100 units x 2 vials, Lot [id number], Exp 03/2019, [place] [id number], office supply.//mck,rn**

## 2016-09-26 NOTE — Addendum Note (Signed)
Addended by: Marcial Pacas on: 09/26/2016 03:34 PM   Modules accepted: Orders

## 2016-09-26 NOTE — Progress Notes (Signed)
Chief Complaint  Patient presents with  . Migraine    Botox 200 units - office supply  . Cervical Radiculopathy    States she is only taking gabapentin 300mg , one capsule BID.  She has extra medication at home, if needed.      PATIENT: Hayley [name] DOB: [birth year]  Chief Complaint  Patient presents with  . Migraine    Botox 200 units - office supply  . Cervical Radiculopathy    States she is only taking gabapentin 300mg , one capsule BID.  She has extra medication at home, if needed.     HISTORICAL  Hayley [name] is a 71 years old right-handed Hayley, seen in refer by hand surgeon Dr. Daryll Brod, and her primary care PA Flora Lipps for evaluation of neck pain, radiating pain to bilateral shoulder, upper extremity  She has past medical history of spasmodic dysphonia, status post surgical correction, also had a history of hypertension, diabetes, DVT, PE following surgical procedure, most recent was right knee replacement in March 2016, is taking anticoagulation pradaxa, chronic low back pain, previous history of epidural injection for low back pain.  She fell from law mower in June 2016, landed on her back, she was able to got up, finished mowing her lawn, later that day, she noticed worsening low back pain, significant neck pain, radiating pain to her bilateral shoulder, upper extremity, hands, she also noticed bilateral hands paresthesia, burning sensation, subjective weakness, she had worsening urinary urgency, wear pads occasionally, mild unbalanced gait, mild constipation  She was evaluated by [facility] orthopedic surgeon Dr. Rolena Infante for her worsening low back pain, reported MRI of lumbar spine at [facility] in July 2016, was told she is not a surgical candidate, she does has multilevel lumbar degenerative disc disease.  She is also concerned about her bilateral hands tremor, getting worse since 2015, most noticeable when she writes, holding utensils, there was no similar  family history  UPDATE August 24th 2016: She continues to complain bilateral neck pain, radiating pain to bilateral shoulder, arm, We have reviewed MRI cervical spine (without) demonstrating: 1. At C3-4: disc bulging and uncovertebral joint hypertrophy and facet hypertrophy with severe left foraminal stenosis 2. At C4-5: disc bulging and uncovertebral joint hypertrophy with severe biforaminal stenosis 3. At C5-6: disc bulging and uncovertebral joint hypertrophy with moderate right and severe left foraminal stenosis 4. At C6-7: disc bulging and uncovertebral joint hypertrophy with moderate biforaminal stenosis 5. No intrinsic or compressive spinal cord lesions  Propanolol 40 mg twice a day has helped her tremor, she denies significant side effect.  UPDATE June 26th 2017: She continue complains of bilateral lower extremity posturing tremor, significant neck pain, radiating pain to bilateral upper extremity, increased unsteady gait, she has fell few times, worsening bilateral lower extremity paresthesia  UPDATE Sept 1 2017: She has worsening low back pain, it gets worse with any movement, difficulty changing bed linen, she is no longer cooking,   She recently had spinal stimulator trial by [facility] by Dr. Delfino Lovett Rauck,,was deemed to be a potential candidate, she did responding well to the spinal stimulator. She complains of midline constant low back pain, radiating down to right hip, right leg and left leg, getting worse with movement, she has bladder urgency, no bowel incontinence.   She had physical therapy, last in July 2017,    She also complains of migraine, seems to get more frequent since March 2017, she is now having headaches 3-5/week, lasting for 4 hours  or longer, with light noise sensitivity nauseous,  She used to get Botox injection as migraine prevention from Dr. Samuel Germany, responded very well, last injection was in 2015, responding well. She is taking Imitrex, Zofran  as needed  She is on antibiotics for chronic UTI,  Update June 20 2016: She came in for Botox injection as migraine prevention, also complains worsening bilateral hands tremor, hope to received EMG guided Botox injection for her right hand tremor.  UPDATE Sep 27 2015: She had sudden onset of left shoulder pain, neck pain, I personally reviewed x-ray of the cervical spine, evidence of multilevel degenerative disc disease most severe C5-6 C6-7, mild to moderate bilateral foraminal stenosis  She responded well to previous Botox injection in October 2017, reported 75% improvement, over past 3 months, used Imitrex injection once, 4 tablets of Imitrex 25 mg, works well,  She was not sure about the benefit of EMG guided botulism toxin injection for her right hand tremor,   REVIEW OF SYSTEMS: Full 14 system review of systems performed and notable only for above   ALLERGIES: Allergies  Allergen Reactions  . Bee Venom Anaphylaxis  . Tape Rash    Rash with paper tape  . Etodolac Diarrhea and Nausea And Vomiting  . Morphine Other (See Comments)    Hallucinations  . Other Itching and Swelling  . Poison Ivy Extract  [Poison Ivy Extract] Itching and Swelling  . Propoxyphene Nausea And Vomiting  . Propoxyphene N-Acetaminophen Nausea And Vomiting  . Penicillins Rash    Has patient had a PCN reaction causing immediate rash, facial/tongue/throat swelling, SOB or lightheadedness with hypotension: Yes Has patient had a PCN reaction causing severe rash involving mucus membranes or skin necrosis: No Has patient had a PCN reaction that required hospitalization No Has patient had a PCN reaction occurring within the last 10 years: No If all of the above answers are "NO", then may proceed with Cephalosporin use.     HOME MEDICATIONS: Current Outpatient Prescriptions  Medication Sig Dispense Refill  . albuterol (PROVENTIL HFA;VENTOLIN HFA) 108 (90 BASE) MCG/ACT inhaler Inhale 1 puff into the lungs  every 6 (six) hours as needed for wheezing or shortness of breath.    [name] albuterol (PROVENTIL) (2.5 MG/3ML) 0.083% nebulizer solution Take 2.5 mg by nebulization every 6 (six) hours as needed for wheezing or shortness of breath.    . benzonatate (TESSALON) 100 MG capsule Take 100 mg by mouth 3 (three) times daily as needed for cough.    . clonazePAM (KLONOPIN) 0.5 MG tablet Take 0.5 mg by mouth at bedtime.    . cyclobenzaprine (FLEXERIL) 10 MG tablet Take 10 mg by mouth at bedtime.     [name] EPINEPHrine (EPIPEN) 0.3 mg/0.3 mL DEVI Inject 0.3 mg into the muscle once as needed (Allergic reaction).     . fluorometholone (FML) 0.1 % ophthalmic suspension Place 1 drop into both eyes daily as needed (Eye allergies).   0  . fluticasone (FLONASE) 50 MCG/ACT nasal spray Place 1 spray into both nostrils daily as needed for allergies or rhinitis.    [name] gabapentin (NEURONTIN) 800 MG tablet Take 800-1,600 mg by mouth 2 (two) times daily. 800 in the morning and 1600 at pm    . HYDROcodone-acetaminophen (NORCO/VICODIN) 5-325 MG per tablet Take one to two tablets by mouth every 4 hours as needed for pain. Do not exceed 4gm of Tylenol in 24 hours 360 tablet 0  . levothyroxine (SYNTHROID, LEVOTHROID) 25 MCG tablet Take 25 mcg by  mouth every morning.     . methocarbamol (ROBAXIN) 500 MG tablet Take 1 tablet (500 mg total) by mouth every 6 (six) hours as needed for muscle spasms. 90 tablet 0  . mometasone-formoterol (DULERA) 100-5 MCG/ACT AERO Inhale 2 puffs into the lungs 2 (two) times daily.    [name] nystatin (MYCOSTATIN) 100000 UNIT/ML suspension Take 5 mLs by mouth daily as needed (Yeast).    . ondansetron (ZOFRAN) 4 MG tablet Take 4 mg by mouth every 8 (eight) hours as needed for nausea or vomiting.    . ondansetron (ZOFRAN-ODT) 8 MG disintegrating tablet Take 8 mg by mouth every 8 (eight) hours as needed for nausea or vomiting.    [name] oxyCODONE (OXY IR/ROXICODONE) 5 MG immediate release tablet Take 1-2 tablets (5-10 mg total)  by mouth every 3 (three) hours as needed for moderate pain or severe pain. 90 tablet 0  . pantoprazole (PROTONIX) 40 MG tablet Take 40 mg by mouth daily.    . Phenazopyridine HCl (AZO TABS PO) Take 1 tablet by mouth daily as needed (UTI).    . primidone (MYSOLINE) 50 MG tablet Take 50 mg by mouth at bedtime.    . promethazine (PHENERGAN) 25 MG tablet Take 25 mg by mouth every 6 (six) hours as needed for nausea or vomiting.    . Rivaroxaban (XARELTO) 15 MG TABS tablet Take 1 tablet (15 mg total) by mouth 2 (two) times daily with a meal. 60 tablet 5  . rizatriptan (MAXALT) 10 MG tablet Take 10 mg by mouth as needed for migraine (migraine). May repeat in 2 hours if needed    . sertraline (ZOLOFT) 100 MG tablet Take 100 mg by mouth daily.    . sitaGLIPtin (JANUVIA) 100 MG tablet Take 100 mg by mouth every evening.     . SUMAtriptan (IMITREX) 6 MG/0.5ML SOLN injection Inject 6 mg into the skin every 2 (two) hours as needed for migraine or headache. May repeat in 2 hours if headache persists or recurs.    . valACYclovir (VALTREX) 1000 MG tablet Take 2,000 mg by mouth 2 (two) times daily as needed (Fever blisters).   2     PAST MEDICAL HISTORY: Past Medical History:  Diagnosis Date  . Anemia   . Anxiety   . Bronchitis    hx of   . Clotting disorder ([place]) 02-17-14   S/p appendectomy-developed Pulmonary emboli-tx. warfarin-d/c 1 yr ago.  . Colon polyps   . Depression   . Diabetes mellitus without complication ([place])    "borderline"- oral med  . Disorder of vocal cords    spasmotic dysphonia ,02-17-14 has" whispery voice-low tone"  . Hyperthyroidism   . Kidney stones   . Migraines   . Nausea Nov 15, 2014   cycle of migraine headaches and nausea  . Neuromuscular disorder ([place])    hands and throat"spasmodic dysphonia", tremors - thimb and forefinger   . Obesity, Class III, BMI 40-49.9 (morbid obesity) ([place]) 04/27/2010   02-17-14 reports some weight loss- intentional  . Osteoarthritis    in  back(chronic pain)  . Pelvic pain    02-17-14 "states thinks its back related on left groin"  . Pulmonary emboli [facility])    s/p Appendectomy '13 [facility]  . Reflux   . Right knee pain   . Sleep apnea    cpap - settings at 4   . Toxic goiter     PAST SURGICAL HISTORY: Past Surgical History:  Procedure Laterality Date  . ABDOMINAL HYSTERECTOMY    .  APPENDECTOMY     3'13 [facility] s/p developed  Pulmonary Emboli  . BOTOX INJECTION     for migranes  . CATARACT EXTRACTION, BILATERAL Bilateral 03/2013   . CHOLECYSTECTOMY    . HAND SURGERY  2004   both hands  . KIDNEY STONE SURGERY    . KNEE ARTHROSCOPY  1998   right  . LAPAROSCOPIC APPENDECTOMY  11/27/2011   Procedure: APPENDECTOMY LAPAROSCOPIC;  Surgeon: Adin Hector, MD;  Location: [facility];  Service: General;  Laterality: N/A;  . TOTAL KNEE ARTHROPLASTY Right 12/06/2014   Procedure: RIGHT TOTAL KNEE ARTHROPLASTY;  Surgeon: Gaynelle Arabian, MD;  Location: [facility];  Service: Orthopedics;  Laterality: Right;  . TOTAL KNEE REVISION Left 02/24/2014   Procedure: LEFT TOTAL KNEE REVISION;  Surgeon: Gearlean Alf, MD;  Location: [facility];  Service: Orthopedics;  Laterality: Left;  . vocal cord surgery  02-17-14   01-12-14 done in [place] [facility](s/p selective denervation/reinnervation recurrent laryngeal nerve surgery)  . zenkers diverticulum      FAMILY HISTORY: Family History  Problem Relation Age of Onset  . Heart disease Mother   . Pneumonia Father   . Cirrhosis Father   . Hypertension Father   . Alcoholism Father   . Heart attack Paternal Grandfather   . Heart attack Maternal Grandfather   . Heart attack Paternal Grandmother   . Heart attack Maternal Grandmother     SOCIAL HISTORY:  Social History   Social History  . Marital status: Married    Spouse name: N/A  . Number of children: 5  . Years of education: 31   Occupational History  . Retired    Social History Main Topics  . Smoking status: Former Smoker    Quit date:  01/03/1982  . Smokeless tobacco: Never Used  . Alcohol use No  . Drug use: No  . Sexual activity: Not on file   Other Topics Concern  . Not on file   Social History Narrative   Lives at home with husband.   Right-handed.   2 cups caffeine per day.     PHYSICAL EXAM   Vitals:   09/26/16 1420  BP: 113/72  Pulse: 84  Weight: 231 lb 4 oz (104.9 kg)  Height: 5\' 5"  (1.651 m)    Not recorded      Body mass index is 38.48 kg/m.  PHYSICAL EXAMNIATION:  Gen: NAD, conversant, well nourised, obese, well groomed                     Cardiovascular: Regular rate rhythm, no peripheral edema, warm, nontender. Eyes: Conjunctivae clear without exudates or hemorrhage Neck: Supple, no carotid bruise. Pulmonary: Clear to auscultation bilaterally   NEUROLOGICAL EXAM:  MENTAL STATUS: Speech:    Mild dysphonic speech; fluent and spontaneous with normal comprehension.  Cognition:     Orientation to time, place and person     Normal recent and remote memory     Normal Attention span and concentration     Normal Language, naming, repeating,spontaneous speech     Fund of knowledge   CRANIAL NERVES: CN II: Visual fields are full to confrontation. Fundoscopic exam is normal with sharp discs and no vascular changes. Pupils are round equal and briskly reactive to light. CN III, IV, VI: extraocular movement are normal. No ptosis. CN V: Facial sensation is intact to pinprick in all 3 divisions bilaterally. Corneal responses are intact.  CN VII: Face is symmetric with normal eye closure and  smile. CN VIII: Hearing is normal to rubbing fingers CN IX, X: Palate elevates symmetrically. Phonation is normal. CN XI: Head turning and shoulder shrug are intact CN XII: Tongue is midline with normal movements and no atrophy.  MOTOR: Mild bilateral hands postural tremor, normal muscle tone, bulk, strength   REFLEXES: Reflexes are 2+ and symmetric at the biceps, triceps, knees, and ankles. Plantar  responses are flexor.  SENSORY: Intact to light touch, pinprick, position sense, and vibration sense are intact in fingers and toes.  COORDINATION: Rapid alternating movements and fine finger movements are intact. There is no dysmetria on finger-to-nose and heel-knee-shin.    GAIT/STANCE: Posture is normal. Gait is cautious, mildly unsteady  DIAGNOSTIC DATA (LABS, IMAGING, TESTING) - I reviewed patient records, labs, notes, testing and imaging myself where available.   ASSESSMENT AND PLAN  Hayley [name] is a 71 y.o. Hayley   Left cervical radiculopathy:  We have reviewed MRI cervical spine 2016 : Multilevel degenerative disc disease, severe foraminal stenosis at left C3-4, bilateral C4-5, left C5-6,:  I reviewed her gabapentin at lower dose 300 mg 2 tablets 3 times a day    Essential Tremor:  Tried primidone in the past without significant improvement, primidone has the potential interaction with her polypharmacy  Keep propanolol 60 mg twice a day  Chronic anticoagulation:  History of DVT, PE, following surgical procedure, most recent one was March 2016 following her right knee replacement  On chronic pradaxa treatment  Worsening bilateral lower extremity paresthesia, gait abnormality, falling episodes  No evidence of large fiber peripheral neuropathy lumbar sacral radiculopathy on today's electrodiagnostic study,   I have suggested her continue swimming exercise,  she is planning on to have spinal cord stimulator placement  Chronic migraine headaches  Previously responded well to Botox injection, BOTOX injection was performed according to protocol by [employer]. 100 units of BOTOX was dissolved into 2 cc NS.    Total of 155 units,  Corrugator 2 sites, 10 units Procerus 1 site, 5 unit Frontalis 4 sites,  20 units, Temporalis 8 sites,  40 units  Occipitalis 6 sites, 30 units Cervical Paraspinal, 4 sites, 20 units Trapezius, 6 sites, 30 units  Left neck and shoulder  pain 45 units under electrical stimulation  Left levator scapular 25 units Left upper trapezius 10 Left rhomboid 110 units    Marcial Pacas, M.D. Ph.D.  [facility] [address] [place], [place] [address] Ph: [phone] Fax: [phone]  CC: To Dr. Daryll Brod, Flora Lipps

## 2016-09-28 ENCOUNTER — Telehealth: Payer: Self-pay | Admitting: Obstetrics & Gynecology

## 2016-09-28 NOTE — Telephone Encounter (Signed)
Spoke with patient. Patient states she has seen Dr. Sabra Heck and urologist recently. (Last OV 05/08/16) Patient states she is now having similar symptoms to what she was having. Patient reports yellow/green vaginal discharge with odor and vaginal itching. Patient reports symptoms started last week. Patient states she has not changed anything or done anything new. Patient denies urinary complaints, fever, pain, pressure or burning. Recommended OV for further evaluation, patient declined to be seen today. Patient would like to know if something could be called in? Advised patient last OV was 05/08/16, would need to evaluate in office to insure proper treatment. Patient states she can be seen next week, declined appt on Monday with Kem Boroughs, NP, patient scheduled for 10/02/16 at 2:45pm with Dr. Sabra Heck. Patient asking if anything she can do in meantime? Advised patient she ca be evaluated at local urgent care or ER, patient states she has an appointment at urgent care 1/13 for f/u for shoulder pain. Advised patient she may be evaluated at the urgent care, patient declined stating she does not feel comfortable with the female providers. Advised patient would review with Dr. Sabra Heck and return call with any additional recommendations, patient is agreeable.  Routing to provider for final review. Patient is agreeable to disposition. Will close encounter.

## 2016-09-28 NOTE — Telephone Encounter (Signed)
Patient has developed some symptoms that she would like to talk with Dr.Miller's nurse about.

## 2016-09-30 DIAGNOSIS — M542 Cervicalgia: Secondary | ICD-10-CM | POA: Diagnosis not present

## 2016-09-30 DIAGNOSIS — M25512 Pain in left shoulder: Secondary | ICD-10-CM | POA: Diagnosis not present

## 2016-09-30 DIAGNOSIS — S40012S Contusion of left shoulder, sequela: Secondary | ICD-10-CM | POA: Diagnosis not present

## 2016-09-30 DIAGNOSIS — S161XXS Strain of muscle, fascia and tendon at neck level, sequela: Secondary | ICD-10-CM | POA: Diagnosis not present

## 2016-10-02 ENCOUNTER — Encounter: Payer: Self-pay | Admitting: Obstetrics & Gynecology

## 2016-10-02 ENCOUNTER — Ambulatory Visit (INDEPENDENT_AMBULATORY_CARE_PROVIDER_SITE_OTHER): Payer: Medicare Other | Admitting: Obstetrics & Gynecology

## 2016-10-02 ENCOUNTER — Other Ambulatory Visit: Payer: Self-pay | Admitting: Oncology

## 2016-10-02 ENCOUNTER — Other Ambulatory Visit: Payer: Self-pay | Admitting: *Deleted

## 2016-10-02 VITALS — BP 112/76 | HR 92 | Resp 14 | Ht 65.0 in | Wt 235.2 lb

## 2016-10-02 DIAGNOSIS — R3129 Other microscopic hematuria: Secondary | ICD-10-CM | POA: Diagnosis not present

## 2016-10-02 DIAGNOSIS — Z658 Other specified problems related to psychosocial circumstances: Secondary | ICD-10-CM | POA: Diagnosis not present

## 2016-10-02 DIAGNOSIS — N898 Other specified noninflammatory disorders of vagina: Secondary | ICD-10-CM

## 2016-10-02 DIAGNOSIS — D689 Coagulation defect, unspecified: Secondary | ICD-10-CM

## 2016-10-02 DIAGNOSIS — N760 Acute vaginitis: Secondary | ICD-10-CM | POA: Diagnosis not present

## 2016-10-02 MED ORDER — TERCONAZOLE 0.4 % VA CREA: 1.0000 | TOPICAL_CREAM | Freq: Every day | VAGINAL | 0 refills | Status: DC

## 2016-10-02 NOTE — Progress Notes (Signed)
GYNECOLOGY  VISIT   HPI:  71 y.o. [id number] Married Caucasian female with several week hx of vaginal odor.  She is having some mild discharge.  Denies vaginal bleeding.  Had episode last fall where when went to the beach and ended up with sand all in her vulvar region and even in the vagina.  Since that time, she feels like she is having recurrent issues with this.  Also feels like she had more odor when she empties her bladder.  Pt had microscopic hematuria last year and never returned for repeat urine testing (despite phone calls), although she has seen her urologist.  She does not think she's had any testing on her urine since I saw her last.  Agrees to doing this today.    Pt has tried estrogen cream in the past and had increased headaches with this, so does not want to use this again if possible.  She does report having some increased bloating sensations.  She does have obesity so it is hard to observe this symptom in her.  States she feels like if she could just have a large BM, she would feel this resolve.  Denies significant constipation issues.  States, she does have some from time to time but not a bothersome issue for her typically.  Pt also wants to share a neighbor recently committed suicide.  She is just so surprised by this as she "didn't see it coming".  Is praying to see if she should be doing something else in her community or neighborhood about this.  Felt like she needed to talk about it some today.  Does not feel depression regarding this just sadness for his family, in particular.   GYNECOLOGIC HISTORY: Patient's last menstrual period was 09/18/1979 (approximate). Contraception: PMP Menopausal hormone therapy: none  Patient Active Problem List   Diagnosis Date Noted  . Neck pain on left side 09/26/2016  . Tremor 06/20/2016  . Obesity, Class II, BMI 35-39.9, isolated (see actual BMI) 04/09/2016  . UTI (lower urinary tract infection)   . Paresthesia of both lower extremities  03/22/2016  . Vertigo 03/22/2016  . Essential tremor 03/12/2016  . Fall 03/12/2016  . Low back pain 03/12/2016  . Coagulopathy ([place]) 07/20/2015  . Diabetes mellitus without complication ([place]) [id number]  . Essential hypertension 06/03/2015  . OA (osteoarthritis) of knee 12/06/2014  . Hypothyroidism 03/03/2014  . Asthma 03/03/2014  . Acute blood loss anemia 03/03/2014  . Left knee pain 03/03/2014  . Failed total knee arthroplasty ([place]) 02/24/2014  . OSA on CPAP 12/23/2013  . History of pulmonary embolus (PE) 12/21/2013  . 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 ANEMIA 04/27/2010  . ANXIETY DEPRESSION 04/27/2010  . Chronic migraine 04/27/2010  . NEUROPATHY 04/27/2010  . HEMORRHOIDS 04/27/2010  . GERD 04/27/2010  . DIVERTICULOSIS, COLON 04/27/2010  . IBS 04/27/2010  . CHOLELITHIASIS 04/27/2010  . RENAL CALCULUS 04/27/2010    Past Medical History:  Diagnosis Date  . Anemia   . Anxiety   . Bronchitis    hx of   . Clotting disorder ([place]) 02-17-14   S/p appendectomy-developed Pulmonary emboli-tx. warfarin-d/c 1 yr ago.  . Colon polyps   . Depression   . Diabetes mellitus without complication ([place])    "borderline"- oral med  . Disorder of vocal cords    spasmotic dysphonia ,02-17-14 has" whispery voice-low tone"  . Hyperthyroidism   . Kidney stones   . Migraines   . Nausea Nov 15, 2014   cycle of migraine headaches  and nausea  . Neuromuscular disorder ([place])    hands and throat"spasmodic dysphonia", tremors - thimb and forefinger   . Obesity, Class III, BMI 40-49.9 (morbid obesity) ([place]) 04/27/2010   02-17-14 reports some weight loss- intentional  . Osteoarthritis    in back(chronic pain)  . Pelvic pain    02-17-14 "states thinks its back related on left groin"  . Pulmonary emboli [facility])    s/p Appendectomy '13 [facility]  . Reflux   . Right knee pain   . Sleep apnea    cpap - settings at 4   . Toxic goiter     Past Surgical History:  Procedure Laterality Date  . ABDOMINAL  HYSTERECTOMY    . APPENDECTOMY     3'13 [facility] s/p developed  Pulmonary Emboli  . BOTOX INJECTION     for migranes  . CATARACT EXTRACTION, BILATERAL Bilateral 03/2013   . CHOLECYSTECTOMY    . HAND SURGERY  2004   both hands  . KIDNEY STONE SURGERY    . KNEE ARTHROSCOPY  1998   right  . LAPAROSCOPIC APPENDECTOMY  11/27/2011   Procedure: APPENDECTOMY LAPAROSCOPIC;  Surgeon: Adin Hector, MD;  Location: [facility];  Service: General;  Laterality: N/A;  . SPINAL CORD STIMULATOR IMPLANT  06/04/2016   Dr. Alfonse Ras at [facility]  . TOTAL KNEE ARTHROPLASTY Right 12/06/2014   Procedure: RIGHT TOTAL KNEE ARTHROPLASTY;  Surgeon: Gaynelle Arabian, MD;  Location: [facility];  Service: Orthopedics;  Laterality: Right;  . TOTAL KNEE REVISION Left 02/24/2014   Procedure: LEFT TOTAL KNEE REVISION;  Surgeon: Gearlean Alf, MD;  Location: [facility];  Service: Orthopedics;  Laterality: Left;  . vocal cord surgery  02-17-14   01-12-14 done in [place] [facility](s/p selective denervation/reinnervation recurrent laryngeal nerve surgery)  . zenkers diverticulum      MEDS:  Reviewed in EPIC and UTD  ALLERGIES: Bee venom; Tape; Etodolac; Morphine; Other; Poison ivy extract  [poison ivy extract]; Propoxyphene; Propoxyphene n-acetaminophen; and Penicillins  Family History  Problem Relation Age of Onset  . Heart disease Mother   . Pneumonia Father   . Cirrhosis Father   . Hypertension Father   . Alcoholism Father   . Heart attack Paternal Grandfather   . Heart attack Maternal Grandfather   . Heart attack Paternal Grandmother   . Heart attack Maternal Grandmother     SH:  Married, non smoker  Review of Systems  Gastrointestinal: Negative for abdominal pain.  Genitourinary: Negative.     PHYSICAL EXAMINATION:    BP 112/76 (BP Location: Right Arm, Patient Position: Sitting, Cuff Size: Large)   Pulse 92   Resp 14   Ht 5\' 5"  (1.651 m)   Wt 235 lb 3.2 oz (106.7 kg)   LMP 09/18/1979 (Approximate)   BMI  39.14 kg/m     General appearance: alert, cooperative and appears stated age Abdomen: soft, non-tender; bowel sounds normal; no masses,  no organomegaly  Pelvic: External genitalia:  no lesions              Urethra:  normal appearing urethra with no masses, tenderness or lesions              Bartholins and Skenes: normal                 Vagina: normal appearing vagina with normal color and discharge, no lesions, atrophic appearance              Cervix: absent  Bimanual Exam:  Uterus:  uterus absent              Adnexa: normal adnexa but exam is difficult due to obesity              Anus:  no lesions  Then using sterile technique, cath u/a obtained.  Chaperone was present for exam.  Assessment: Vaginal odor and mild discharge H/O microscopic hematuria, pt did not return for follow up despite phone calls Abdominal bloating Recent suicide of neighbor causing her sadness  Plan: Affirm obtained today.  As there is predicted snow and we may be closed tomorrow as well as there being road issues for a few days, pt would like something for treatment now.  Terazol 7 rx sent to the pharmacy.   Urine micro obtained today Consider vaginal ultrasound after current testing is back if everything is negative and bloating symptoms continue. Pt declines need for any therapy needs due to recent suicide of neighbor.   ~25 minutes spent with patient >50% of time was in face to face discussion of above.

## 2016-10-03 ENCOUNTER — Ambulatory Visit: Payer: Self-pay | Admitting: Oncology

## 2016-10-03 ENCOUNTER — Other Ambulatory Visit: Payer: Self-pay

## 2016-10-03 LAB — URINALYSIS, MICROSCOPIC ONLY
Bacteria, UA: NONE SEEN [HPF]
Casts: NONE SEEN [LPF]
Crystals: NONE SEEN [HPF]
RBC / HPF: NONE SEEN RBC/HPF (ref <=2)
WBC, UA: NONE SEEN WBC/HPF (ref <=5)
Yeast: NONE SEEN [HPF]

## 2016-10-03 LAB — WET PREP BY MOLECULAR PROBE
Candida species: NEGATIVE
Gardnerella vaginalis: POSITIVE — AB
Trichomonas vaginosis: NEGATIVE

## 2016-10-05 ENCOUNTER — Telehealth: Payer: Self-pay | Admitting: *Deleted

## 2016-10-05 NOTE — Telephone Encounter (Signed)
-----   Message from Megan Salon, MD sent at 10/03/2016  7:55 AM EST ----- Please let pt know the testing showed BV.  This will give her the odor she is describing.  She needs to treat either with Metrogel [id number] one applicator nightly x 5 days or metronidazole 500mg  bid x 7 days.  No ETOH with oral medication if that is what she desires.

## 2016-10-05 NOTE — Telephone Encounter (Signed)
Left voicemail to call back.  Please route to triage if I am not available.  

## 2016-10-08 ENCOUNTER — Other Ambulatory Visit: Payer: Self-pay | Admitting: *Deleted

## 2016-10-08 MED ORDER — GABAPENTIN 300 MG PO CAPS: 600.0000 mg | ORAL_CAPSULE | Freq: Three times a day (TID) | ORAL | 3 refills | Status: DC

## 2016-10-11 MED ORDER — METRONIDAZOLE 500 MG PO TABS: 500.0000 mg | ORAL_TABLET | Freq: Two times a day (BID) | ORAL | 0 refills | Status: DC

## 2016-10-11 NOTE — Telephone Encounter (Signed)
Attempted to reach patient to give results, unable to leave message as line just kept ringing.

## 2016-10-11 NOTE — Telephone Encounter (Signed)
Spoke with patient to schedule her next injection. She wanted me to make the nurse and Dr. [name] aware that she has been having some headaches recently and would like to speak with someone regarding this. Please call and advise.

## 2016-10-11 NOTE — Telephone Encounter (Signed)
Spoke to patient - reports having 3-4 migraines since seen on 09/26/16.  The weather is sometimes a trigger.  All headaches have responded well to sumatriptan.  She just wanted these episodes noted in her chart.  She has scheduled her next Botox on 01/02/17.

## 2016-10-11 NOTE — Telephone Encounter (Signed)
Message left on cell phone to return call to [place] at [phone].

## 2016-10-11 NOTE — Telephone Encounter (Signed)
Left messages, on her home and mobile numbers, requesting a return call.

## 2016-10-11 NOTE — Telephone Encounter (Signed)
Patient returned call. Results given to patient and she verbalized understanding. Patient requesting flagyl be called in for her as she is currently using terazol cream vaginally and does not want to be using 2 creams. Pharmacy on file verified ([facility] per patient request). Flagyl sent in to pharmacy. Instructions given on use of flagyl and alcohol precautions reviewed. Patient verbalized understanding.    Routing to provider for final review. Patient agreeable to disposition. Will close encounter.

## 2016-10-12 ENCOUNTER — Telehealth: Payer: Self-pay | Admitting: *Deleted

## 2016-10-12 NOTE — Telephone Encounter (Signed)
Pt reports SOB and Chest pressure just below her sternum for the past one hour.  She wants to know what Dr. [name] recommends.  Instructed pt to hang up and call 911 to go to ED for chest pain/ sob.   Pt is hesitant and asks if ok to just take a baby aspirin?  Instructed pt ok to take aspirin after she calls 911.  She said she does not want to "push the panic button" and leave her elderly husband.   Suggested to pt it will be far worse for her husband if something happens to her at home and he has to call 911.  Pt agreed to call 911 and wants Dr [name] to be aware.

## 2016-10-22 ENCOUNTER — Telehealth: Payer: Self-pay | Admitting: Obstetrics & Gynecology

## 2016-10-22 NOTE — Telephone Encounter (Signed)
Spoke with patient. Patient states she was treated with Terazol 7 and Flagyl 10/11/16. Patient states she is still having symptoms of green/yellow vaginal discharge and  vaginal odor. Patient states labia is burning and itchy. Patient states she has been having lower back pain for 3 weeks, denies fever or nausea. Patient states she thinks there is a diagnosis hiding. Patient states all of this happened after Aug 2017 when she decided to sit in water at beach after falling. Patient states she has been seen at [facility] urology and told everything is "clean and pristine down there". Patient states she has a nerve stimulator implanted and the stimulation is much like a vibrator, could this stimulation be causing symptoms? Patient state she has discussed this previously with physician.Recommended OV with Dr. Sabra Heck for further evaluation. Advised patient Dr. Sanjuan Dame first available appointment 2/8, can schedule earlier with another provider, patient declined. Patient scheduled for 10/25/16 at 2pm with Dr. Sabra Heck. Advised patient she may use coconut  oil externally to vagina to help relieve burning and itching.Patient states she does not want to use any more prescription vaginal creams, they are messy. Advised patient would review with Dr. Sabra Heck and return call with any additional recommendations.   Patient is agreeable.  Routing to provider for final review. Patient is agreeable to disposition. Will close encounter.

## 2016-10-22 NOTE — Telephone Encounter (Signed)
Patient wants to come in to see Dr. Sabra Heck only. She is still having a vaginal infection and wants to know what she can use at home. No appointment with Dr. Sabra Heck today offered appointment with another provider patient declined.

## 2016-10-25 ENCOUNTER — Ambulatory Visit: Payer: Self-pay | Admitting: Obstetrics & Gynecology

## 2016-10-25 ENCOUNTER — Telehealth: Payer: Self-pay | Admitting: Obstetrics & Gynecology

## 2016-10-25 NOTE — Telephone Encounter (Signed)
Left message to call Audra Bellard at [phone].  

## 2016-10-25 NOTE — Telephone Encounter (Signed)
Patient canceled her appointment for today. She has diarrhea, itching, pressure and an odor. She is asking if there is anything that can be called into the pharmacy for her. [facility] [place]

## 2016-10-25 NOTE — Telephone Encounter (Signed)
Pt needs appts.  Just routed to [facility] so also cc'ing her on this.

## 2016-10-25 NOTE — Telephone Encounter (Signed)
Call to patient she is scheduled for 10/26/16@ 2:45 w/ Dr. Jeanie Cooks.

## 2016-10-25 NOTE — Telephone Encounter (Signed)
Pt needs appt

## 2016-10-26 ENCOUNTER — Ambulatory Visit: Payer: Medicare Other | Admitting: Obstetrics & Gynecology

## 2016-10-26 ENCOUNTER — Telehealth: Payer: Self-pay | Admitting: Obstetrics & Gynecology

## 2016-10-26 NOTE — Telephone Encounter (Signed)
Patient had an appointment this afternoon with Dr.Miller at 2:45pm. Patient said " I got ready for my appointment and just do not feel well enough to leave my house". I canceled the appointment and informed patient a nurse would call her.

## 2016-10-26 NOTE — Telephone Encounter (Signed)
Call to patient. States she has had diarrhea for two days and although not as bad today as yesterday, she did not feel strong enough to drive to office. Has taken Zofran for nausea but nothing for diarrhea.has had small amounts of diarrhea each time she voids for last two days. Says there is "nothing left in her" now. She is trying to stay hydrated. Urine has a strong odor.  Advised if she is so weak that she is unable to drive, she needs further evaluation. Husband is home with her now. Advised strongly recommend she be taken to urgent care for evaluation and possible IVF that we are unable to offer her here.  Encouraged her to call us back next week when improved to schedule follow-up and assessment of vaginal issues. Patient agreeable to plan as discussed.   Routing to provider for final review. Patient agreeable to disposition. Will close encounter.

## 2016-11-06 ENCOUNTER — Telehealth: Payer: Self-pay | Admitting: Oncology

## 2016-11-06 ENCOUNTER — Encounter: Payer: Self-pay | Admitting: Oncology

## 2016-11-06 ENCOUNTER — Ambulatory Visit: Payer: Self-pay | Admitting: Oncology

## 2016-11-06 ENCOUNTER — Other Ambulatory Visit: Payer: Self-pay

## 2016-11-06 NOTE — Telephone Encounter (Signed)
sw pt to confirm r/s appt to 3/28 at 1030 am per telephone encounter

## 2016-11-06 NOTE — Telephone Encounter (Signed)
Husband fell and has been taken to [place] and also the patient is concerned with getting the flu would like to R/S for March. Please call before you make appointment   [phone]

## 2016-11-12 DIAGNOSIS — Z5181 Encounter for therapeutic drug level monitoring: Secondary | ICD-10-CM | POA: Diagnosis not present

## 2016-11-12 DIAGNOSIS — G8929 Other chronic pain: Secondary | ICD-10-CM | POA: Diagnosis not present

## 2016-11-12 DIAGNOSIS — M5440 Lumbago with sciatica, unspecified side: Secondary | ICD-10-CM | POA: Diagnosis not present

## 2016-11-12 DIAGNOSIS — G894 Chronic pain syndrome: Secondary | ICD-10-CM | POA: Diagnosis not present

## 2016-11-12 DIAGNOSIS — M542 Cervicalgia: Secondary | ICD-10-CM | POA: Diagnosis not present

## 2016-11-12 DIAGNOSIS — Z79899 Other long term (current) drug therapy: Secondary | ICD-10-CM | POA: Diagnosis not present

## 2016-11-12 DIAGNOSIS — Z9689 Presence of other specified functional implants: Secondary | ICD-10-CM | POA: Diagnosis not present

## 2016-11-20 ENCOUNTER — Telehealth: Payer: Self-pay | Admitting: *Deleted

## 2016-11-20 ENCOUNTER — Other Ambulatory Visit: Payer: Self-pay | Admitting: *Deleted

## 2016-11-20 NOTE — Telephone Encounter (Signed)
PA approved for cyclobenazprine through [facility] [phone]).  Valid through 11/20/17.  Case [id number].

## 2016-11-22 ENCOUNTER — Other Ambulatory Visit: Payer: Self-pay | Admitting: *Deleted

## 2016-11-22 ENCOUNTER — Telehealth: Payer: Self-pay

## 2016-11-22 MED FILL — ELIQUIS 5 MG TABLET: 5 | 7 days supply | Qty: 14 | Fill #0

## 2016-11-22 NOTE — Telephone Encounter (Signed)
Pt called stating she is out of her eliquis for the the last 2 weeks. [facility] is to be taking.

## 2016-11-23 NOTE — Telephone Encounter (Signed)
This RN post pt discussion of concern of cost of Eliquis and " I am hoping I can get samples from my new internist but I will not see her until next Thursday ( 3/15) " " I cannot get my pharmacy to fill a partial amount because they said they cannot break apart a seal package."  This RN was able to send refill to the [facility] for a 10 day amount with 1 refill.

## 2016-11-30 DIAGNOSIS — F331 Major depressive disorder, recurrent, moderate: Secondary | ICD-10-CM | POA: Diagnosis not present

## 2016-11-30 DIAGNOSIS — E119 Type 2 diabetes mellitus without complications: Secondary | ICD-10-CM | POA: Diagnosis not present

## 2016-11-30 DIAGNOSIS — Z5181 Encounter for therapeutic drug level monitoring: Secondary | ICD-10-CM | POA: Diagnosis not present

## 2016-11-30 DIAGNOSIS — E039 Hypothyroidism, unspecified: Secondary | ICD-10-CM | POA: Diagnosis not present

## 2016-11-30 DIAGNOSIS — Z7984 Long term (current) use of oral hypoglycemic drugs: Secondary | ICD-10-CM | POA: Diagnosis not present

## 2016-12-12 ENCOUNTER — Ambulatory Visit (HOSPITAL_BASED_OUTPATIENT_CLINIC_OR_DEPARTMENT_OTHER): Payer: Medicare Other | Admitting: Oncology

## 2016-12-12 ENCOUNTER — Other Ambulatory Visit (HOSPITAL_BASED_OUTPATIENT_CLINIC_OR_DEPARTMENT_OTHER): Payer: Medicare Other

## 2016-12-12 VITALS — BP 116/71 | HR 79 | Temp 98.1°F | Resp 17 | Ht 65.0 in | Wt 233.9 lb

## 2016-12-12 DIAGNOSIS — E119 Type 2 diabetes mellitus without complications: Secondary | ICD-10-CM | POA: Diagnosis not present

## 2016-12-12 DIAGNOSIS — D689 Coagulation defect, unspecified: Secondary | ICD-10-CM | POA: Diagnosis not present

## 2016-12-12 DIAGNOSIS — Z86711 Personal history of pulmonary embolism: Secondary | ICD-10-CM | POA: Diagnosis not present

## 2016-12-12 LAB — COMPREHENSIVE METABOLIC PANEL
ALT: 16 U/L (ref 0–55)
AST: 14 U/L (ref 5–34)
Albumin: 3.7 g/dL (ref 3.5–5.0)
Alkaline Phosphatase: 102 U/L (ref 40–150)
Anion Gap: 9 mEq/L (ref 3–11)
BUN: 8 mg/dL (ref 7.0–26.0)
CO2: 25 mEq/L (ref 22–29)
Calcium: 9.4 mg/dL (ref 8.4–10.4)
Chloride: 103 mEq/L (ref 98–109)
Creatinine: 0.8 mg/dL (ref 0.6–1.1)
EGFR: 72 mL/min/{1.73_m2} — ABNORMAL LOW (ref >90)
Glucose: 234 mg/dl — ABNORMAL HIGH (ref 70–140)
Potassium: 4.6 mEq/L (ref 3.5–5.1)
Sodium: 138 mEq/L (ref 136–145)
Total Bilirubin: 0.34 mg/dL (ref 0.20–1.20)
Total Protein: 7.5 g/dL (ref 6.4–8.3)

## 2016-12-12 LAB — CBC WITH DIFFERENTIAL/PLATELET
BASO%: 0.3 % (ref 0.0–2.0)
Basophils Absolute: 0 10*3/uL (ref 0.0–0.1)
EOS%: 4.3 % (ref 0.0–7.0)
Eosinophils Absolute: 0.3 10*3/uL (ref 0.0–0.5)
HCT: 43.5 % (ref 34.8–46.6)
HGB: 14.1 g/dL (ref 11.6–15.9)
LYMPH%: 21.9 % (ref 14.0–49.7)
MCH: 28.1 pg (ref 25.1–34.0)
MCHC: 32.4 g/dL (ref 31.5–36.0)
MCV: 86.7 fL (ref 79.5–101.0)
MONO#: 0.6 10*3/uL (ref 0.1–0.9)
MONO%: 9.4 % (ref 0.0–14.0)
NEUT#: 4.1 10*3/uL (ref 1.5–6.5)
NEUT%: 64.1 % (ref 38.4–76.8)
Platelets: 239 10*3/uL (ref 145–400)
RBC: 5.02 10*6/uL (ref 3.70–5.45)
RDW: 14.4 % (ref 11.2–14.5)
WBC: 6.4 10*3/uL (ref 3.9–10.3)
lymph#: 1.4 10*3/uL (ref 0.9–3.3)

## 2016-12-12 MED ORDER — APIXABAN 2.5 MG PO TABS: 2.5000 mg | ORAL_TABLET | Freq: Two times a day (BID) | ORAL | 3 refills | Status: DC

## 2016-12-12 NOTE — Progress Notes (Signed)
Thank you Hayley. [name]  [phone] [phone]     ID: Hayley [name] DOB: [birth year]  MR#: [id number]  KPV#:[id number]  Patient Care Team: Hayley Commons, FNP as PCP - General (Internal Medicine) Hayley Craver, MD as Consulting Physician (Gastroenterology) Hayley Brod, MD as Consulting Physician (Orthopedic Surgery) PCP: Hayley [place], FNP SU: Hayley Boston MD OTHER MD: Hayley Arabian MD  CHIEF COMPLAINT: coagulopathy  CURRENT TREATMENT: Apixaban   HISTORY OF PRESENT ILLNESS: From the 06/05/2015 consult note:  "[name] underwent laparoscopic appendectomy under [facility] [name] 11/27/2014 for what was likely a perforated appendix with phlegmon formation. On 12/06/2014 she was noted to be hypoxic and a chest CT/angio was obtained, showing multiple small bilateral PEs. LE dopplers that day were negative. UE dopplers the next day showed only a R cephalic v superficial thrombus.   The patient was started on warfarin, continued for one year. Repeat CT/angio 12/22/2013 showed resolution of the earlier clot.  On 12/06/2014 the patient underwent R TKR. On 12/09/2014 she was noted to be SOB and CT/angio documented bilateral PEs. She was started on Xarelto, later changed by her PCP to Pradaxa. Repeat CT angios 05/15/2014 and 06/03/2015 showed resolution of the earlier clots."  Her subsequent history is as detailed below  INTERVAL HISTORY: Hayley [name] returns today for follow-up of her coagulopathy. Her chief risk factors are of course her prior pulmonary emboli, but also sedentary lifestyle/deconditioning and morbid obesity.  She continues on apixaban, with excellent tolerance. She has had no bleeding problems and no further clotting problems. She does not like the cost of the drug and that is the main concern to her.  REVIEW OF SYSTEMS: Hayley [name] looks clearly better today than she did last visit. She tells me the reason for this is that her 72 year old husband is doing  better. She feels very centered, although she still does not have any energy and she can't get her 3 story house cleaning. She is very easily short of breath. There have been no unusual headaches, visual changes, or falls. She is considering bariatric surgery. A detailed review of systems today was otherwise stable  PAST MEDICAL HISTORY: Past Medical History:  Diagnosis Date  . Anemia   . Anxiety   . Bronchitis    hx of   . Clotting disorder ([place]) 02-17-14   S/p appendectomy-developed Pulmonary emboli-tx. warfarin-d/c 1 yr ago.  . Colon polyps   . Depression   . Diabetes mellitus without complication ([place])    "borderline"- oral med  . Disorder of vocal cords    spasmotic dysphonia ,02-17-14 has" whispery voice-low tone"  . Hyperthyroidism   . Kidney stones   . Migraines   . Nausea Nov 15, 2014   cycle of migraine headaches and nausea  . Neuromuscular disorder ([place])    hands and throat"spasmodic dysphonia", tremors - thimb and forefinger   . Obesity, Class III, BMI 40-49.9 (morbid obesity) ([place]) 04/27/2010   02-17-14 reports some weight loss- intentional  . Osteoarthritis    in back(chronic pain)  . Pelvic pain    02-17-14 "states thinks its back related on left groin"  . Pulmonary emboli [facility])    s/p Appendectomy '13 [facility]  . Reflux   . Right knee pain   . Sleep apnea    cpap - settings at 4   . Toxic goiter     PAST SURGICAL HISTORY: Past Surgical History:  Procedure Laterality Date  . ABDOMINAL HYSTERECTOMY    . APPENDECTOMY  6'71 [facility] s/p developed  Pulmonary Emboli  . BOTOX INJECTION     for migranes  . CATARACT EXTRACTION, BILATERAL Bilateral 03/2013   . CHOLECYSTECTOMY    . HAND SURGERY  2004   both hands  . KIDNEY STONE SURGERY    . KNEE ARTHROSCOPY  1998   right  . LAPAROSCOPIC APPENDECTOMY  11/27/2011   Procedure: APPENDECTOMY LAPAROSCOPIC;  Surgeon: Adin Hector, MD;  Location: [facility]S;  Service: General;  Laterality: N/A;  . SPINAL CORD STIMULATOR IMPLANT   06/04/2016   Hayley. Alfonse Ras at [facility]  . TOTAL KNEE ARTHROPLASTY Right 12/06/2014   Procedure: RIGHT TOTAL KNEE ARTHROPLASTY;  Surgeon: Hayley Arabian, MD;  Location: [facility];  Service: Orthopedics;  Laterality: Right;  . TOTAL KNEE REVISION Left 02/24/2014   Procedure: LEFT TOTAL KNEE REVISION;  Surgeon: Gearlean Alf, MD;  Location: [facility];  Service: Orthopedics;  Laterality: Left;  . vocal cord surgery  02-17-14   01-12-14 done in [place](s/p selective denervation/reinnervation recurrent laryngeal nerve surgery)  . zenkers diverticulum      FAMILY HISTORY Family History  Problem Relation Age of Onset  . Heart disease Mother   . Pneumonia Father   . Cirrhosis Father   . Hypertension Father   . Alcoholism Father   . Heart attack Paternal Grandfather   . Heart attack Maternal Grandfather   . Heart attack Paternal Grandmother   . Heart attack Maternal Grandmother     GYNECOLOGIC HISTORY:  Patient's last menstrual period was 09/18/1979 (approximate).  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 The patient has 2 children from an earlier marriage, Hayley [name], who lives in [name], and a daughter Hayley [name], who manages a Building surveyor. The patient has one grandchild of her own, 28 with Hayley [name] (who has 3 children from a prior marriage)     HEALTH MAINTENANCE: Social History  Substance Use Topics  . Smoking status: Former Smoker    Quit date: 01/03/1982  . Smokeless tobacco: Never Used  . Alcohol use No     Colonoscopy:  PAP:  Bone density:  Lipid panel:  Allergies  Allergen Reactions  . Bee Venom Anaphylaxis  . Tape Rash    Rash with paper tape  . Etodolac Diarrhea and Nausea And Vomiting  . Morphine Other (See Comments)    Hallucinations  . Other Itching and Swelling  . Poison Ivy Extract  [Poison Ivy Extract] Itching and Swelling  . Propoxyphene  Nausea And Vomiting  . Propoxyphene N-Acetaminophen Nausea And Vomiting  . Penicillins Rash    Has patient had a PCN reaction causing immediate rash, facial/tongue/throat swelling, SOB or lightheadedness with hypotension: Yes Has patient had a PCN reaction causing severe rash involving mucus membranes or skin necrosis: No Has patient had a PCN reaction that required hospitalization No Has patient had a PCN reaction occurring within the last 10 years: No If all of the above answers are "NO", then may proceed with Cephalosporin use.     Current Outpatient Prescriptions  Medication Sig Dispense Refill  . albuterol (PROVENTIL HFA;VENTOLIN HFA) 108 (90 BASE) MCG/ACT inhaler Inhale 1 puff into the lungs every 6 (six) hours as needed for wheezing or shortness of breath.    [name] albuterol (PROVENTIL) (2.5 MG/3ML) 0.083% nebulizer solution Take 2.5 mg by nebulization every 6 (six) hours as needed for wheezing or shortness  of breath. Reported on 01/10/2016    . apixaban (ELIQUIS) 2.5 MG TABS tablet Take 1 tablet (2.5 mg total) by mouth 2 (two) times daily. X 7 days, then 1 tablet (5 mg) two times daily starting 03/31/16 90 tablet 3  . benzonatate (TESSALON) 100 MG capsule Take 100 mg by mouth 2 (two) times daily as needed for cough.    [name] BIOTIN PO Take 1 capsule by mouth daily.    . Botulinum Toxin Type A (BOTOX) 200 units SOLR Inject as directed every 3 (three) months.    [name] buPROPion (WELLBUTRIN XL) 150 MG 24 hr tablet Take 150 mg by mouth every morning.  0  . cholecalciferol (VITAMIN D) 1000 units tablet Take 1,000 Units by mouth daily.    . clonazePAM (KLONOPIN) 0.5 MG tablet Take 1 tablet (0.5 mg total) by mouth at bedtime. 30 tablet 0  . cyanocobalamin (,VITAMIN B-12,) 1000 MCG/ML injection Inject 1 mL into the skin every 30 (thirty) days.  11  . cyclobenzaprine (FLEXERIL) 10 MG tablet Take 1 tablet (10 mg total) by mouth at bedtime. 90 tablet 4  . diphenhydrAMINE (BENADRYL) 25 MG tablet Take 50 mg by  mouth every 6 (six) hours as needed for itching.    . DULoxetine (CYMBALTA) 60 MG capsule Take 1 capsule (60 mg total) by mouth every morning. 90 capsule 4  . gabapentin (NEURONTIN) 300 MG capsule Take 2 capsules (600 mg total) by mouth 3 (three) times daily. 540 capsule 3  . levothyroxine (SYNTHROID, LEVOTHROID) 25 MCG tablet Take 25 mcg by mouth every morning.     . metroNIDAZOLE (FLAGYL) 500 MG tablet Take 1 tablet (500 mg total) by mouth 2 (two) times daily. 14 tablet 0  . mometasone-formoterol (DULERA) 100-5 MCG/ACT AERO Inhale 2 puffs into the lungs 2 (two) times daily.    . ondansetron (ZOFRAN-ODT) 8 MG disintegrating tablet Take 1 tablet (8 mg total) by mouth every 8 (eight) hours as needed for nausea or vomiting. 60 tablet 4  . Probiotic Product (ACIDOPHILUS/GOAT MILK) CAPS Take by mouth daily.    . propranolol (INDERAL) 60 MG tablet Take 1 tablet (60 mg total) by mouth 2 (two) times daily. 180 tablet 3  . SUMAtriptan (IMITREX) 25 MG tablet Take 1 tablet (25 mg total) by mouth every 2 (two) hours as needed for migraine. May repeat in 2 hours if headache persists or recurs. 30 tablet 3  . SUMAtriptan Succinate Refill 4 MG/0.5ML SOCT Inject 4 mg into the skin as needed. 5 mL 6  . terconazole (TERAZOL 7) 0.4 % vaginal cream Place 1 applicator vaginally at bedtime. 45 g 0  . valACYclovir (VALTREX) 1000 MG tablet Take 2,000 mg by mouth 2 (two) times daily as needed (Fever blisters). Reported on 01/10/2016  2  . vitamin B-12 500 MCG tablet Take 1 tablet (500 mcg total) by mouth daily. 30 tablet 0   No current facility-administered medications for this visit.     OBJECTIVE: middle-aged White woman Who appears stated age 71:   12/12/16 1122  BP: 116/71  Pulse: 79  Resp: 17  Temp: 98.1 F (36.7 C)     Body mass index is 38.92 kg/m.    ECOG FS:2 - Symptomatic, <50% confined to bed  Sclerae unicteric, pupils round and equal Oropharynx clear and moist No cervical or supraclavicular  adenopathy Lungs no rales or rhonchi Heart regular rate and rhythm Abd soft, obese, nontender, positive bowel sounds MSK no focal spinal tenderness, no upper extremity  lymphedema Neuro: nonfocal, well oriented, appropriate affect Breasts: Deferred     LAB RESULTS: CMP     Component Value Date/Time   NA 138 12/12/2016 1105   K 4.6 12/12/2016 1105   CL 101 03/22/2016 1303   CO2 25 12/12/2016 1105   GLUCOSE 234 (H) 12/12/2016 1105   BUN 8.0 12/12/2016 1105   CREATININE 0.8 12/12/2016 1105   CALCIUM 9.4 12/12/2016 1105   PROT 7.5 12/12/2016 1105   ALBUMIN 3.7 12/12/2016 1105   AST 14 12/12/2016 1105   ALT 16 12/12/2016 1105   ALKPHOS 102 12/12/2016 1105   BILITOT 0.34 12/12/2016 1105   GFRNONAA >60 03/22/2016 1258   GFRAA >60 03/22/2016 1258    INo results found for: SPEP, UPEP  Lab Results  Component Value Date   WBC 6.4 12/12/2016   NEUTROABS 4.1 12/12/2016   HGB 14.1 12/12/2016   HCT 43.5 12/12/2016   MCV 86.7 12/12/2016   PLT 239 12/12/2016      Chemistry      Component Value Date/Time   NA 138 12/12/2016 1105   K 4.6 12/12/2016 1105   CL 101 03/22/2016 1303   CO2 25 12/12/2016 1105   BUN 8.0 12/12/2016 1105   CREATININE 0.8 12/12/2016 1105      Component Value Date/Time   CALCIUM 9.4 12/12/2016 1105   ALKPHOS 102 12/12/2016 1105   AST 14 12/12/2016 1105   ALT 16 12/12/2016 1105   BILITOT 0.34 12/12/2016 1105       No results found for: LABCA2  No components found for: LABCA125  No results for input(s): INR in the last 168 hours.  Urinalysis    Component Value Date/Time   COLORURINE YELLOW 03/22/2016 1530   APPEARANCEUR CLOUDY (A) 03/22/2016 1530   APPEARANCEUR CLOUDY 12/19/2014 0730   LABSPEC 1.007 03/22/2016 1530   LABSPEC 1.025 12/19/2014 0730   PHURINE 6.5 03/22/2016 1530   GLUCOSEU NEGATIVE 03/22/2016 1530   GLUCOSEU see comment 12/19/2014 0730   HGBUR TRACE (A) 03/22/2016 1530   BILIRUBINUR n 05/08/2016 1506   BILIRUBINUR see  comment 12/19/2014 0730   KETONESUR NEGATIVE 03/22/2016 1530   PROTEINUR trace 05/08/2016 1506   PROTEINUR NEGATIVE 03/22/2016 1530   UROBILINOGEN negative 05/08/2016 1506   UROBILINOGEN 1.0 11/30/2014 1215   NITRITE n 05/08/2016 1506   NITRITE POSITIVE (A) 03/22/2016 1530   LEUKOCYTESUR large (3+) (A) 05/08/2016 1506   LEUKOCYTESUR see comment 12/19/2014 0730   STUDIES: No results found.  ASSESSMENT: 71 y.o. [place] woman with a history of recurrent PE as follows:  (1) multiple low-volume PE noted on chest CT/angio 12/06/2014, 10 days after laparoscopic appendectomy for a perforated appendix with phlegmon (a) bilateral LE dopplers 12/06/2014 negative (b) bilateral UE dopplers 12/07/2014 showed superficial R cephalic v clot (c) treatment: warfarin x one year, monitored through [facility] (d) repeat CT angio 12/23/2015 showed prior PEs cleared; also CT/angio 05/15/2014 negative  (2) bilateral PEs documented by Ct/angio03/24/2016, day 3 post R total knee (a) started on rivaroxaban/ Xarelto, switched by PCP to dabigratan/ Pradaxa, discontinued May 2017 because of cost issues (b) repeat CT/angio 06/03/2015 shows resolution of prior PE  (c) started on apixaban 5 mg daily with symptoms suggestive of stroke, but negative workup, July 2017  (d) apixaban dose decreased to 2.5 mg daily given history of falls but persistently elevated d-dimer  (3) hypercoagulable panel 06/05/2015 including a homocystine level, a screen for the lupus anticoagulant, beta 2 glycoprotein studies, anti-cardiolipin antibodies, and studies for  the factor V Leiden and prothrombin gene mutation found no actionable abnormalities  (a) the anticardiolipin IgM antibody 06/05/2015 was 34 ("low medium positive"), repeat 07/18/2015 was 38  PLAN: Agueda feels better, is moving better, and is more positive. This is all largely psychological, but  she wants to continue to improve and the way to do that of course would involve significant weight loss and then an exercise program. She is considering bariatric surgery. I gave her some names to explore and she will let me know if she wishes to have a referral placed.  Otherwise she will see me again in one year. Unless she loses a significant amount of weight and becomes less the condition plan is to continue apixaban indefinitely  She knows to call for any problems that may develop before her next visit.    Chauncey Cruel, MD   12/12/2016 11:50 AM

## 2016-12-13 LAB — D-DIMER, QUANTITATIVE: D-DIMER: 0.5 mg/L FEU — ABNORMAL HIGH (ref 0.00–0.49)

## 2016-12-14 LAB — CARDIOLIPIN ANTIBODIES, IGG, IGM, IGA
Anticardiolipin Ab,IgA,Qn: <9 APL U/mL (ref 0–11)
Anticardiolipin Ab,IgG,Qn: <9 GPL U/mL (ref 0–14)
Anticardiolipin Ab,IgM,Qn: 41 MPL U/mL — ABNORMAL HIGH (ref 0–12)

## 2016-12-18 DIAGNOSIS — Z85828 Personal history of other malignant neoplasm of skin: Secondary | ICD-10-CM | POA: Diagnosis not present

## 2016-12-18 DIAGNOSIS — L853 Xerosis cutis: Secondary | ICD-10-CM | POA: Diagnosis not present

## 2016-12-18 DIAGNOSIS — L821 Other seborrheic keratosis: Secondary | ICD-10-CM | POA: Diagnosis not present

## 2016-12-18 DIAGNOSIS — L304 Erythema intertrigo: Secondary | ICD-10-CM | POA: Diagnosis not present

## 2016-12-18 DIAGNOSIS — L820 Inflamed seborrheic keratosis: Secondary | ICD-10-CM | POA: Diagnosis not present

## 2016-12-18 DIAGNOSIS — F458 Other somatoform disorders: Secondary | ICD-10-CM | POA: Diagnosis not present

## 2016-12-18 DIAGNOSIS — L814 Other melanin hyperpigmentation: Secondary | ICD-10-CM | POA: Diagnosis not present

## 2016-12-19 ENCOUNTER — Telehealth: Payer: Self-pay | Admitting: Neurology

## 2016-12-19 ENCOUNTER — Encounter: Payer: Self-pay | Admitting: Neurology

## 2016-12-19 ENCOUNTER — Ambulatory Visit (INDEPENDENT_AMBULATORY_CARE_PROVIDER_SITE_OTHER): Payer: Medicare Other | Admitting: Neurology

## 2016-12-19 VITALS — BP 102/68 | HR 128 | Ht 65.0 in | Wt 235.5 lb

## 2016-12-19 DIAGNOSIS — J383 Other diseases of vocal cords: Secondary | ICD-10-CM

## 2016-12-19 DIAGNOSIS — G250 Essential tremor: Secondary | ICD-10-CM | POA: Diagnosis not present

## 2016-12-19 DIAGNOSIS — E119 Type 2 diabetes mellitus without complications: Secondary | ICD-10-CM | POA: Diagnosis not present

## 2016-12-19 DIAGNOSIS — E039 Hypothyroidism, unspecified: Secondary | ICD-10-CM

## 2016-12-19 MED ORDER — SUMATRIPTAN SUCCINATE REFILL 4 MG/0.5ML ~~LOC~~ SOCT: 4.0000 mg | SUBCUTANEOUS | 4 refills | Status: DC | PRN

## 2016-12-19 MED ORDER — PROPRANOLOL HCL 60 MG PO TABS: 60.0000 mg | ORAL_TABLET | Freq: Two times a day (BID) | ORAL | 4 refills | Status: DC

## 2016-12-19 MED ORDER — SUMATRIPTAN SUCCINATE 25 MG PO TABS: 25.0000 mg | ORAL_TABLET | ORAL | 4 refills | Status: DC | PRN

## 2016-12-19 MED ORDER — SUMATRIPTAN SUCCINATE 25 MG PO TABS: 25.0000 mg | ORAL_TABLET | ORAL | 0 refills | Status: DC | PRN

## 2016-12-19 NOTE — Telephone Encounter (Signed)
Spoke to patient - she has been experiencing full body tremors since 12/16/16 that have progressively worsened, especially in the last 24 hours.  She has been placed on Dr. Rhea Belton schedule today to be further evaluated.

## 2016-12-19 NOTE — Telephone Encounter (Signed)
Patient states has had tremors over her entire body over the last 24 hours. Please call and advise.

## 2016-12-19 NOTE — Progress Notes (Signed)
Chief Complaint  Patient presents with  . Work-in appt for worsening tremors    She is here with her husband, Arnell Sieving.  She has been experiencing full body tremors since 12/16/16 that have progressively worsened, especially in the last 24 hours.       PATIENT: Hayley [name] DOB: 02/07/1946  Chief Complaint  Patient presents with  . Work-in appt for worsening tremors    She is here with her husband, Arnell Sieving.  She has been experiencing full body tremors since 12/16/16 that have progressively worsened, especially in the last 24 hours.      HISTORICAL  DIVINA NEALE is a 71 years old right-handed female, seen in refer by hand surgeon Dr. Daryll Brod, and her primary care PA Flora Lipps for evaluation of neck pain, radiating pain to bilateral shoulder, upper extremity  She has past medical history of spasmodic dysphonia, status post surgical correction, also had a history of hypertension, diabetes, DVT, PE following surgical procedure, most recent was right knee replacement in March 2016, is taking anticoagulation pradaxa, chronic low back pain, previous history of epidural injection for low back pain.  She fell from law mower in June 2016, landed on her back, she was able to got up, finished mowing her lawn, later that day, she noticed worsening low back pain, significant neck pain, radiating pain to her bilateral shoulder, upper extremity, hands, she also noticed bilateral hands paresthesia, burning sensation, subjective weakness, she had worsening urinary urgency, wear pads occasionally, mild unbalanced gait, mild constipation  She was evaluated by [facility] orthopedic surgeon Dr. Rolena Infante for her worsening low back pain, reported MRI of lumbar spine at [facility] in July 2016, was told she is not a surgical candidate, she does has multilevel lumbar degenerative disc disease.  She is also concerned about her bilateral hands tremor, getting worse since 2015, most noticeable when she writes,  holding utensils, there was no similar family history  UPDATE August 24th 2016: She continues to complain bilateral neck pain, radiating pain to bilateral shoulder, arm, We have reviewed MRI cervical spine (without) demonstrating: 1. At C3-4: disc bulging and uncovertebral joint hypertrophy and facet hypertrophy with severe left foraminal stenosis 2. At C4-5: disc bulging and uncovertebral joint hypertrophy with severe biforaminal stenosis 3. At C5-6: disc bulging and uncovertebral joint hypertrophy with moderate right and severe left foraminal stenosis 4. At C6-7: disc bulging and uncovertebral joint hypertrophy with moderate biforaminal stenosis 5. No intrinsic or compressive spinal cord lesions  Propanolol 40 mg twice a day has helped her tremor, she denies significant side effect.  UPDATE June 26th 2017: She continue complains of bilateral lower extremity posturing tremor, significant neck pain, radiating pain to bilateral upper extremity, increased unsteady gait, she has fell few times, worsening bilateral lower extremity paresthesia  UPDATE Sept 1 2017: She has worsening low back pain, it gets worse with any movement, difficulty changing bed linen, she is no longer cooking,   She recently had spinal stimulator trial by [facility] by Dr. Delfino Lovett Rauck,,was deemed to be a potential candidate, she did responding well to the spinal stimulator. She complains of midline constant low back pain, radiating down to right hip, right leg and left leg, getting worse with movement, she has bladder urgency, no bowel incontinence.   She had physical therapy, last in July 2017,    She also complains of migraine, seems to get more frequent since March 2017, she is now having headaches 3-5/week, lasting for 4 hours  or longer, with light noise sensitivity nauseous,  She used to get Botox injection as migraine prevention from Dr. Samuel Germany, responded very well, last injection was in 2015,  responding well. She is taking Imitrex, Zofran as needed  She is on antibiotics for chronic UTI,  Update June 20 2016: She came in for Botox injection as migraine prevention, also complains worsening bilateral hands tremor, hope to received EMG guided Botox injection for her right hand tremor.  UPDATE Sep 27 2015: She had sudden onset of left shoulder pain, neck pain, I personally reviewed x-ray of the cervical spine, evidence of multilevel degenerative disc disease most severe C5-6 C6-7, mild to moderate bilateral foraminal stenosis  She responded well to previous Botox injection in October 2017, reported 75% improvement, over past 3 months, used Imitrex injection once, 4 tablets of Imitrex 25 mg, works well,  She was not sure about the benefit of EMG guided botulism toxin injection for her right hand tremor,  UPDATE December 19 2016: She complains of frequent headaches since December 16 2016, bilateral occipital region, pressure, pounding, nauseous, light sensitivity, she still complains 7 out of 10 headaches, despite multiple dose of Imitrex 25 mg tablets, and Imitrex injection, she has used up all her supplies, she also noticed increased bilateral hands tremor, feeling electronic sensation underneath her skin, to her shoulder down to her arms and legs, it is out of her control, but she denied anxiety or depression,  She has self adjusted her medication recently, she stopped the Wellbutrin by herself for a while, started about 3 weeks ago, also has run out of propanolol,  Husband also reported that she spent most of time in her bed, not feeling well.  REVIEW OF SYSTEMS: Full 14 system review of systems performed and notable only for above   ALLERGIES: Allergies  Allergen Reactions  . Bee Venom Anaphylaxis  . Tape Rash    Rash with paper tape  . Etodolac Diarrhea and Nausea And Vomiting  . Morphine Other (See Comments)    Hallucinations  . Other Itching and Swelling  . Poison Ivy Extract   [Poison Ivy Extract] Itching and Swelling  . Propoxyphene Nausea And Vomiting  . Propoxyphene N-Acetaminophen Nausea And Vomiting  . Penicillins Rash    Has patient had a PCN reaction causing immediate rash, facial/tongue/throat swelling, SOB or lightheadedness with hypotension: Yes Has patient had a PCN reaction causing severe rash involving mucus membranes or skin necrosis: No Has patient had a PCN reaction that required hospitalization No Has patient had a PCN reaction occurring within the last 10 years: No If all of the above answers are "NO", then may proceed with Cephalosporin use.     HOME MEDICATIONS: Current Outpatient Prescriptions  Medication Sig Dispense Refill  . albuterol (PROVENTIL HFA;VENTOLIN HFA) 108 (90 BASE) MCG/ACT inhaler Inhale 1 puff into the lungs every 6 (six) hours as needed for wheezing or shortness of breath.    [name] albuterol (PROVENTIL) (2.5 MG/3ML) 0.083% nebulizer solution Take 2.5 mg by nebulization every 6 (six) hours as needed for wheezing or shortness of breath.    . benzonatate (TESSALON) 100 MG capsule Take 100 mg by mouth 3 (three) times daily as needed for cough.    . clonazePAM (KLONOPIN) 0.5 MG tablet Take 0.5 mg by mouth at bedtime.    . cyclobenzaprine (FLEXERIL) 10 MG tablet Take 10 mg by mouth at bedtime.     [name] EPINEPHrine (EPIPEN) 0.3 mg/0.3 mL DEVI Inject 0.3 mg into the muscle  once as needed (Allergic reaction).     . fluorometholone (FML) 0.1 % ophthalmic suspension Place 1 drop into both eyes daily as needed (Eye allergies).   0  . fluticasone (FLONASE) 50 MCG/ACT nasal spray Place 1 spray into both nostrils daily as needed for allergies or rhinitis.    [name] gabapentin (NEURONTIN) 800 MG tablet Take 800-1,600 mg by mouth 2 (two) times daily. 800 in the morning and 1600 at pm    . HYDROcodone-acetaminophen (NORCO/VICODIN) 5-325 MG per tablet Take one to two tablets by mouth every 4 hours as needed for pain. Do not exceed 4gm of Tylenol in 24 hours  360 tablet 0  . levothyroxine (SYNTHROID, LEVOTHROID) 25 MCG tablet Take 25 mcg by mouth every morning.     . methocarbamol (ROBAXIN) 500 MG tablet Take 1 tablet (500 mg total) by mouth every 6 (six) hours as needed for muscle spasms. 90 tablet 0  . mometasone-formoterol (DULERA) 100-5 MCG/ACT AERO Inhale 2 puffs into the lungs 2 (two) times daily.    [name] nystatin (MYCOSTATIN) 100000 UNIT/ML suspension Take 5 mLs by mouth daily as needed (Yeast).    . ondansetron (ZOFRAN) 4 MG tablet Take 4 mg by mouth every 8 (eight) hours as needed for nausea or vomiting.    . ondansetron (ZOFRAN-ODT) 8 MG disintegrating tablet Take 8 mg by mouth every 8 (eight) hours as needed for nausea or vomiting.    [name] oxyCODONE (OXY IR/ROXICODONE) 5 MG immediate release tablet Take 1-2 tablets (5-10 mg total) by mouth every 3 (three) hours as needed for moderate pain or severe pain. 90 tablet 0  . pantoprazole (PROTONIX) 40 MG tablet Take 40 mg by mouth daily.    . Phenazopyridine HCl (AZO TABS PO) Take 1 tablet by mouth daily as needed (UTI).    . primidone (MYSOLINE) 50 MG tablet Take 50 mg by mouth at bedtime.    . promethazine (PHENERGAN) 25 MG tablet Take 25 mg by mouth every 6 (six) hours as needed for nausea or vomiting.    . Rivaroxaban (XARELTO) 15 MG TABS tablet Take 1 tablet (15 mg total) by mouth 2 (two) times daily with a meal. 60 tablet 5  . rizatriptan (MAXALT) 10 MG tablet Take 10 mg by mouth as needed for migraine (migraine). May repeat in 2 hours if needed    . sertraline (ZOLOFT) 100 MG tablet Take 100 mg by mouth daily.    . sitaGLIPtin (JANUVIA) 100 MG tablet Take 100 mg by mouth every evening.     . SUMAtriptan (IMITREX) 6 MG/0.5ML SOLN injection Inject 6 mg into the skin every 2 (two) hours as needed for migraine or headache. May repeat in 2 hours if headache persists or recurs.    . valACYclovir (VALTREX) 1000 MG tablet Take 2,000 mg by mouth 2 (two) times daily as needed (Fever blisters).   2      PAST MEDICAL HISTORY: Past Medical History:  Diagnosis Date  . Anemia   . Anxiety   . Bronchitis    hx of   . Clotting disorder ([place]) 02-17-14   S/p appendectomy-developed Pulmonary emboli-tx. warfarin-d/c 1 yr ago.  . Colon polyps   . Depression   . Diabetes mellitus without complication ([place])    "borderline"- oral med  . Disorder of vocal cords    spasmotic dysphonia ,02-17-14 has" whispery voice-low tone"  . Hyperthyroidism   . Kidney stones   . Migraines   . Nausea Nov 15, 2014  cycle of migraine headaches and nausea  . Neuromuscular disorder ([place])    hands and throat"spasmodic dysphonia", tremors - thimb and forefinger   . Obesity, Class III, BMI 40-49.9 (morbid obesity) ([place]) 04/27/2010   02-17-14 reports some weight loss- intentional  . Osteoarthritis    in back(chronic pain)  . Pelvic pain    02-17-14 "states thinks its back related on left groin"  . Pulmonary emboli [facility])    s/p Appendectomy [facility]  . Reflux   . Right knee pain   . Sleep apnea    cpap - settings at 4   . Toxic goiter     PAST SURGICAL HISTORY: Past Surgical History:  Procedure Laterality Date  . ABDOMINAL HYSTERECTOMY    . APPENDECTOMY     3'13 [facility] s/p developed  Pulmonary Emboli  . BOTOX INJECTION     for migranes  . CATARACT EXTRACTION, BILATERAL Bilateral 03/2013   . CHOLECYSTECTOMY    . HAND SURGERY  2004   both hands  . KIDNEY STONE SURGERY    . KNEE ARTHROSCOPY  1998   right  . LAPAROSCOPIC APPENDECTOMY  11/27/2011   Procedure: APPENDECTOMY LAPAROSCOPIC;  Surgeon: Adin Hector, MD;  Location: [facility];  Service: General;  Laterality: N/A;  . SPINAL CORD STIMULATOR IMPLANT  06/04/2016   [facility] Alfonse Ras at [facility]  . TOTAL KNEE ARTHROPLASTY Right 12/06/2014   Procedure: RIGHT TOTAL KNEE ARTHROPLASTY;  Surgeon: Gaynelle Arabian, MD;  Location: [facility];  Service: Orthopedics;  Laterality: Right;  . TOTAL KNEE REVISION Left 02/24/2014   Procedure: LEFT TOTAL KNEE  REVISION;  Surgeon: Gearlean Alf, MD;  Location: [facility];  Service: Orthopedics;  Laterality: Left;  . vocal cord surgery  02-17-14   01-12-14 done in [place] [facility](s/p selective denervation/reinnervation recurrent laryngeal nerve surgery)  . zenkers diverticulum      FAMILY HISTORY: Family History  Problem Relation Age of Onset  . Heart disease Mother   . Pneumonia Father   . Cirrhosis Father   . Hypertension Father   . Alcoholism Father   . Heart attack Paternal Grandfather   . Heart attack Maternal Grandfather   . Heart attack Paternal Grandmother   . Heart attack Maternal Grandmother     SOCIAL HISTORY:  Social History   Social History  . Marital status: Married    Spouse name: N/A  . Number of children: 5  . Years of education: 7   Occupational History  . Retired    Social History Main Topics  . Smoking status: Former Smoker    Quit date: 01/03/1982  . Smokeless tobacco: Never Used  . Alcohol use No  . Drug use: No  . Sexual activity: No   Other Topics Concern  . Not on file   Social History Narrative   Lives at home with husband.   Right-handed.   2 cups caffeine per day.     PHYSICAL EXAM   Vitals:   12/19/16 1418  BP: 102/68  Pulse: (!) 128  Weight: 235 lb 8 oz (106.8 kg)  Height: 5\' 5"  (1.651 m)    Not recorded      Body mass index is 39.19 kg/m.  PHYSICAL EXAMNIATION:  Gen: NAD, conversant, well nourised, obese, well groomed                     Cardiovascular: Regular rate rhythm, no peripheral edema, warm, nontender. Eyes: Conjunctivae clear without exudates or hemorrhage Neck:  Supple, no carotid bruise. Pulmonary: Clear to auscultation bilaterally   NEUROLOGICAL EXAM:  MENTAL STATUS: Speech:    Mild dysphonic speech; fluent and spontaneous with normal comprehension.  Cognition:     Orientation to time, place and person     Normal recent and remote memory     Normal Attention span and concentration     Normal Language,  naming, repeating,spontaneous speech     Fund of knowledge   CRANIAL NERVES: CN II: Visual fields are full to confrontation. Fundoscopic exam is normal with sharp discs and no vascular changes. Pupils are round equal and briskly reactive to light. CN III, IV, VI: extraocular movement are normal. No ptosis. CN V: Facial sensation is intact to pinprick in all 3 divisions bilaterally. Corneal responses are intact.  CN VII: Face is symmetric with normal eye closure and smile. CN VIII: Hearing is normal to rubbing fingers CN IX, X: Palate elevates symmetrically. Phonation is normal. CN XI: Head turning and shoulder shrug are intact CN XII: Tongue is midline with normal movements and no atrophy.  MOTOR: Mild bilateral hands postural tremor, normal muscle tone, bulk, strength   REFLEXES: Reflexes are 2+ and symmetric at the biceps, triceps, knees, and ankles. Plantar responses are flexor.  SENSORY: Intact to light touch, pinprick, position sense, and vibration sense are intact in fingers and toes.  COORDINATION: Rapid alternating movements and fine finger movements are intact. There is no dysmetria on finger-to-nose and heel-knee-shin.    GAIT/STANCE: Posture is normal. Gait is cautious, mildly unsteady  DIAGNOSTIC DATA (LABS, IMAGING, TESTING) - I reviewed patient records, labs, notes, testing and imaging myself where available.   ASSESSMENT AND PLAN  RAQUEL RACEY is a 71 y.o. female   Left cervical radiculopathy:  We have reviewed MRI cervical spine 2016 : Multilevel degenerative disc disease, severe foraminal stenosis at left C3-4, bilateral C4-5, left C5-6,:  continue gabapentin e 300 mg 2 tablets 3 times a day    Essential Tremor:  Tried primidone in the past without significant improvement, primidone has the potential interaction with her polypharmacy  Keep propanolol 60 mg twice a day, refilled her medications  Chronic anticoagulation:  History of DVT, PE, following  surgical procedure, most recent one was March 2016 following her right knee replacement  On chronic pradaxa treatment  Worsening bilateral lower extremity paresthesia, gait abnormality, falling episodes  No evidence of large fiber peripheral neuropathy or lumbar sacral radiculopathy on previous electrodiagnostic study,   Status post spinal cord stimulator placement  Chronic migraine headaches  Previously responded well to Botox injection,  Return to clinic January 02 2017 for BOTOX injection    Marcial Pacas, M.D. Ph.D.  [facility] [address], [place], [place] [address] Ph: [phone] Fax: [phone]  CC: To Dr. Daryll Brod, Flora Lipps

## 2016-12-20 ENCOUNTER — Other Ambulatory Visit: Payer: Self-pay | Admitting: Oncology

## 2016-12-20 ENCOUNTER — Encounter: Payer: Self-pay | Admitting: *Deleted

## 2016-12-20 ENCOUNTER — Telehealth: Payer: Self-pay | Admitting: Neurology

## 2016-12-20 ENCOUNTER — Other Ambulatory Visit: Payer: Self-pay | Admitting: *Deleted

## 2016-12-20 LAB — HEMOGLOBIN A1C
Est. average glucose Bld gHb Est-mCnc: 223 mg/dL
Hgb A1c MFr Bld: 9.4 % — ABNORMAL HIGH (ref 4.8–5.6)

## 2016-12-20 LAB — TSH: TSH: 1.14 u[IU]/mL (ref 0.450–4.500)

## 2016-12-20 LAB — C-REACTIVE PROTEIN: CRP: 16.1 mg/L — ABNORMAL HIGH (ref 0.0–4.9)

## 2016-12-20 LAB — ANA W/REFLEX: Anti Nuclear Antibody(ANA): NEGATIVE

## 2016-12-20 LAB — SEDIMENTATION RATE: Sed Rate: 12 mm/hr (ref 0–40)

## 2016-12-20 LAB — CK: Total CK: 103 U/L (ref 24–173)

## 2016-12-20 MED ORDER — APIXABAN 2.5 MG PO TABS: 2.5000 mg | ORAL_TABLET | Freq: Two times a day (BID) | ORAL | 3 refills | Status: DC

## 2016-12-20 NOTE — Telephone Encounter (Signed)
This RN verified with pt her current prescription for Eliquis due to recent refill was done for a starter pack as well as noted medication list per visit with neurology yesterday not listing eliquis.  Hayley [name] states she takes the 2.5 mg bid.  Above dose verified correct by MD.  This RN contacted [facility] at [phone] and clarified prescription for dispense of Eliquis 2.5 mg bid.

## 2016-12-20 NOTE — Telephone Encounter (Signed)
Please call patient A1c was 9.4, significant increase compared to 5 months ago, suboptimal control her diabetes likely account for a lot of her complaints,  She should contact her primary care physician for better diabetic control.

## 2016-12-20 NOTE — Telephone Encounter (Signed)
Spoke to patient - she is aware of lab results.  States this has been addressed with her PCP.  She received the following prescriptions today:  1) Januvia 100mg  qd 2) Glipizide XL5mg , qd.

## 2016-12-20 NOTE — Progress Notes (Unsigned)
Thank you [facility]  [phone]) [phone] [phone]) [phone]     ID: Hayley [name] DOB: 22-Aug-1946  MR#: [id number]  UXL#:[id number]  Patient Care Team: [name], FNP as PCP - General (Internal Medicine) Juanita Craver, MD as Consulting Physician (Gastroenterology) Daryll Brod, MD as Consulting Physician (Orthopedic Surgery) PCP: [name], FNP SU: Michael Boston MD OTHER MD: Gaynelle Arabian MD  CHIEF COMPLAINT: coagulopathy  CURRENT TREATMENT: Apixaban   HISTORY OF PRESENT ILLNESS: From the 06/05/2015 consult note:  "Hayley [name] underwent laparoscopic appendectomy under Dr Johney [place] 11/27/2014 for what was likely a perforated appendix with phlegmon formation. On 12/06/2014 she was noted to be hypoxic and a chest CT/angio was obtained, showing multiple small bilateral PEs. LE dopplers that day were negative. UE dopplers the next day showed only a R cephalic v superficial thrombus.   The patient was started on warfarin, continued for one year. Repeat CT/angio 12/22/2013 showed resolution of the earlier clot.  On 12/06/2014 the patient underwent R TKR. On 12/09/2014 she was noted to be SOB and CT/angio documented bilateral PEs. She was started on Xarelto, later changed by her PCP to Pradaxa. Repeat CT angios 05/15/2014 and 06/03/2015 showed resolution of the earlier clots."  Her subsequent history is as detailed below  INTERVAL HISTORY: Hayley [name] returns today for follow-up of her coagulopathy. Her chief risk factors are of course her prior pulmonary emboli, but also sedentary lifestyle/deconditioning and morbid obesity.  She continues on apixaban, with excellent tolerance. She has had no bleeding problems and no further clotting problems. She does not like the cost of the drug and that is the main concern to her.  REVIEW OF SYSTEMS: Hayley [name] looks clearly better today than she did last visit. She tells me the reason for this is that her 28 year old husband is doing  better. She feels very centered, although she still does not have any energy and she can't get her 3 story house cleaning. She is very easily short of breath. There have been no unusual headaches, visual changes, or falls. She is considering bariatric surgery. A detailed review of systems today was otherwise stable  PAST MEDICAL HISTORY: Past Medical History:  Diagnosis Date  . Anemia   . Anxiety   . Bronchitis    hx of   . Clotting disorder ([place]) 02-17-14   S/p appendectomy-developed Pulmonary emboli-tx. warfarin-d/c 1 yr ago.  . Colon polyps   . Depression   . Diabetes mellitus without complication ([place])    "borderline"- oral med  . Disorder of vocal cords    spasmotic dysphonia ,02-17-14 has" whispery voice-low tone"  . Hyperthyroidism   . Kidney stones   . Migraines   . Nausea Nov 15, 2014   cycle of migraine headaches and nausea  . Neuromuscular disorder ([place])    hands and throat"spasmodic dysphonia", tremors - thimb and forefinger   . Obesity, Class III, BMI 40-49.9 (morbid obesity) ([place]) 04/27/2010   02-17-14 reports some weight loss- intentional  . Osteoarthritis    in back(chronic pain)  . Pelvic pain    02-17-14 "states thinks its back related on left groin"  . Pulmonary emboli [facility])    s/p Appendectomy '13 [facility]  . Reflux   . Right knee pain   . Sleep apnea    cpap - settings at 4   . Toxic goiter     PAST SURGICAL HISTORY: Past Surgical History:  Procedure Laterality Date  . ABDOMINAL HYSTERECTOMY    . APPENDECTOMY  4'48 [facility] s/p developed  Pulmonary Emboli  . BOTOX INJECTION     for migranes  . CATARACT EXTRACTION, BILATERAL Bilateral 03/2013   . CHOLECYSTECTOMY    . HAND SURGERY  2004   both hands  . KIDNEY STONE SURGERY    . KNEE ARTHROSCOPY  1998   right  . LAPAROSCOPIC APPENDECTOMY  11/27/2011   Procedure: APPENDECTOMY LAPAROSCOPIC;  Surgeon: Adin Hector, MD;  Location: [facility] ORS;  Service: General;  Laterality: N/A;  . SPINAL CORD STIMULATOR IMPLANT   06/04/2016   Dr. Alfonse Ras at [facility]  . TOTAL KNEE ARTHROPLASTY Right 12/06/2014   Procedure: RIGHT TOTAL KNEE ARTHROPLASTY;  Surgeon: Gaynelle Arabian, MD;  Location: [facility];  Service: Orthopedics;  Laterality: Right;  . TOTAL KNEE REVISION Left 02/24/2014   Procedure: LEFT TOTAL KNEE REVISION;  Surgeon: Gearlean Alf, MD;  Location: [facility];  Service: Orthopedics;  Laterality: Left;  . vocal cord surgery  02-17-14   01-12-14 done in [place] [facility](s/p selective denervation/reinnervation recurrent laryngeal nerve surgery)  . zenkers diverticulum      FAMILY HISTORY Family History  Problem Relation Age of Onset  . Heart disease Mother   . Pneumonia Father   . Cirrhosis Father   . Hypertension Father   . Alcoholism Father   . Heart attack Paternal Grandfather   . Heart attack Maternal Grandfather   . Heart attack Paternal Grandmother   . Heart attack Maternal Grandmother     GYNECOLOGIC HISTORY:  Patient's last menstrual period was 09/18/1979 (approximate).  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 The patient has 2 children from an earlier marriage, Corene Cornea, who lives in [place], and a daughter [name], who manages a Building surveyor. The patient has one grandchild of her own, 37 with Arnell Sieving (who has 3 children from a prior marriage)     HEALTH MAINTENANCE: Social History  Substance Use Topics  . Smoking status: Former Smoker    Quit date: 01/03/1982  . Smokeless tobacco: Never Used  . Alcohol use No     Colonoscopy:  PAP:  Bone density:  Lipid panel:  Allergies  Allergen Reactions  . Bee Venom Anaphylaxis  . Tape Rash    Rash with paper tape  . Etodolac Diarrhea and Nausea And Vomiting  . Morphine Other (See Comments)    Hallucinations  . Other Itching and Swelling  . Poison Ivy Extract  [Poison Ivy Extract] Itching and Swelling  . Propoxyphene  Nausea And Vomiting  . Propoxyphene N-Acetaminophen Nausea And Vomiting  . Penicillins Rash    Has patient had a PCN reaction causing immediate rash, facial/tongue/throat swelling, SOB or lightheadedness with hypotension: Yes Has patient had a PCN reaction causing severe rash involving mucus membranes or skin necrosis: No Has patient had a PCN reaction that required hospitalization No Has patient had a PCN reaction occurring within the last 10 years: No If all of the above answers are "NO", then may proceed with Cephalosporin use.     Current Outpatient Prescriptions  Medication Sig Dispense Refill  . albuterol (PROVENTIL HFA;VENTOLIN HFA) 108 (90 BASE) MCG/ACT inhaler Inhale 1 puff into the lungs every 6 (six) hours as needed for wheezing or shortness of breath.    [name] albuterol (PROVENTIL) (2.5 MG/3ML) 0.083% nebulizer solution Take 2.5 mg by nebulization every 6 (six) hours as needed for wheezing or shortness  of breath. Reported on 01/10/2016    . apixaban (ELIQUIS) 2.5 MG TABS tablet Take 1 tablet (2.5 mg total) by mouth 2 (two) times daily. X 7 days, then 1 tablet (5 mg) two times daily starting 03/31/16 90 tablet 3  . benzonatate (TESSALON) 100 MG capsule Take 100 mg by mouth 2 (two) times daily as needed for cough.    [name] BIOTIN PO Take 1 capsule by mouth daily.    . Botulinum Toxin Type A (BOTOX) 200 units SOLR Inject as directed every 3 (three) months.    [name] buPROPion (WELLBUTRIN XL) 150 MG 24 hr tablet Take 150 mg by mouth every morning.  0  . cholecalciferol (VITAMIN D) 1000 units tablet Take 1,000 Units by mouth daily.    . clonazePAM (KLONOPIN) 0.5 MG tablet Take 1 tablet (0.5 mg total) by mouth at bedtime. 30 tablet 0  . cyanocobalamin (,VITAMIN B-12,) 1000 MCG/ML injection Inject 1 mL into the skin every 30 (thirty) days.  11  . cyclobenzaprine (FLEXERIL) 10 MG tablet Take 1 tablet (10 mg total) by mouth at bedtime. 90 tablet 4  . diphenhydrAMINE (BENADRYL) 25 MG tablet Take 50 mg by  mouth every 6 (six) hours as needed for itching.    . DULoxetine (CYMBALTA) 60 MG capsule Take 1 capsule (60 mg total) by mouth every morning. 90 capsule 4  . gabapentin (NEURONTIN) 300 MG capsule Take 2 capsules (600 mg total) by mouth 3 (three) times daily. 540 capsule 3  . levothyroxine (SYNTHROID, LEVOTHROID) 25 MCG tablet Take 25 mcg by mouth every morning.     . metroNIDAZOLE (FLAGYL) 500 MG tablet Take 1 tablet (500 mg total) by mouth 2 (two) times daily. 14 tablet 0  . mometasone-formoterol (DULERA) 100-5 MCG/ACT AERO Inhale 2 puffs into the lungs 2 (two) times daily.    . ondansetron (ZOFRAN-ODT) 8 MG disintegrating tablet Take 1 tablet (8 mg total) by mouth every 8 (eight) hours as needed for nausea or vomiting. 60 tablet 4  . Probiotic Product (ACIDOPHILUS/GOAT MILK) CAPS Take by mouth daily.    . propranolol (INDERAL) 60 MG tablet Take 1 tablet (60 mg total) by mouth 2 (two) times daily. 180 tablet 4  . SUMAtriptan (IMITREX) 25 MG tablet Take 1 tablet (25 mg total) by mouth every 2 (two) hours as needed for migraine. May repeat in 2 hours if headache persists or recurs. 10 tablet 0  . SUMAtriptan Succinate Refill 4 MG/0.5ML SOCT Inject 4 mg into the skin as needed. Every 3 months 12 Syringe 4  . terconazole (TERAZOL 7) 0.4 % vaginal cream Place 1 applicator vaginally at bedtime. 45 g 0  . valACYclovir (VALTREX) 1000 MG tablet Take 2,000 mg by mouth 2 (two) times daily as needed (Fever blisters). Reported on 01/10/2016  2  . vitamin B-12 500 MCG tablet Take 1 tablet (500 mcg total) by mouth daily. 30 tablet 0   No current facility-administered medications for this visit.     OBJECTIVE: middle-aged White woman Who appears stated age There were no vitals filed for this visit.   There is no height or weight on file to calculate BMI.    ECOG FS:2 - Symptomatic, <50% confined to bed  Sclerae unicteric, pupils round and equal Oropharynx clear and moist No cervical or supraclavicular  adenopathy Lungs no rales or rhonchi Heart regular rate and rhythm Abd soft, obese, nontender, positive bowel sounds MSK no focal spinal tenderness, no upper extremity lymphedema Neuro: nonfocal, well oriented, appropriate  affect Breasts: Deferred     LAB RESULTS: CMP     Component Value Date/Time   NA 138 12/12/2016 1105   K 4.6 12/12/2016 1105   CL 101 03/22/2016 1303   CO2 25 12/12/2016 1105   GLUCOSE 234 (H) 12/12/2016 1105   BUN 8.0 12/12/2016 1105   CREATININE 0.8 12/12/2016 1105   CALCIUM 9.4 12/12/2016 1105   PROT 7.5 12/12/2016 1105   ALBUMIN 3.7 12/12/2016 1105   AST 14 12/12/2016 1105   ALT 16 12/12/2016 1105   ALKPHOS 102 12/12/2016 1105   BILITOT 0.34 12/12/2016 1105   GFRNONAA >60 03/22/2016 1258   GFRAA >60 03/22/2016 1258    INo results found for: SPEP, UPEP  Lab Results  Component Value Date   WBC 6.4 12/12/2016   NEUTROABS 4.1 12/12/2016   HGB 14.1 12/12/2016   HCT 43.5 12/12/2016   MCV 86.7 12/12/2016   PLT 239 12/12/2016      Chemistry      Component Value Date/Time   NA 138 12/12/2016 1105   K 4.6 12/12/2016 1105   CL 101 03/22/2016 1303   CO2 25 12/12/2016 1105   BUN 8.0 12/12/2016 1105   CREATININE 0.8 12/12/2016 1105      Component Value Date/Time   CALCIUM 9.4 12/12/2016 1105   ALKPHOS 102 12/12/2016 1105   AST 14 12/12/2016 1105   ALT 16 12/12/2016 1105   BILITOT 0.34 12/12/2016 1105       No results found for: LABCA2  No components found for: LABCA125  No results for input(s): INR in the last 168 hours.  Urinalysis    Component Value Date/Time   COLORURINE YELLOW 03/22/2016 1530   APPEARANCEUR CLOUDY (A) 03/22/2016 1530   APPEARANCEUR CLOUDY 12/19/2014 0730   LABSPEC 1.007 03/22/2016 1530   LABSPEC 1.025 12/19/2014 0730   PHURINE 6.5 03/22/2016 1530   GLUCOSEU NEGATIVE 03/22/2016 1530   GLUCOSEU see comment 12/19/2014 0730   HGBUR TRACE (A) 03/22/2016 1530   BILIRUBINUR n 05/08/2016 1506   BILIRUBINUR see  comment 12/19/2014 0730   KETONESUR NEGATIVE 03/22/2016 1530   PROTEINUR trace 05/08/2016 1506   PROTEINUR NEGATIVE 03/22/2016 1530   UROBILINOGEN negative 05/08/2016 1506   UROBILINOGEN 1.0 11/30/2014 1215   NITRITE n 05/08/2016 1506   NITRITE POSITIVE (A) 03/22/2016 1530   LEUKOCYTESUR large (3+) (A) 05/08/2016 1506   LEUKOCYTESUR see comment 12/19/2014 0730   STUDIES: No results found.  ASSESSMENT: 71 y.o. [place] woman with a history of recurrent PE as follows:  (1) multiple low-volume PE noted on chest CT/angio 12/06/2014, 10 days after laparoscopic appendectomy for a perforated appendix with phlegmon (a) bilateral LE dopplers 12/06/2014 negative (b) bilateral UE dopplers 12/07/2014 showed superficial R cephalic v clot (c) treatment: warfarin x one year, monitored through [facility] (d) repeat CT angio 12/23/2015 showed prior PEs cleared; also CT/angio 05/15/2014 negative  (2) bilateral PEs documented by Ct/angio03/24/2016, day 3 post R total knee (a) started on rivaroxaban/ Xarelto, switched by PCP to dabigratan/ Pradaxa, discontinued May 2017 because of cost issues (b) repeat CT/angio 06/03/2015 shows resolution of prior PE  (c) started on apixaban 5 mg daily with symptoms suggestive of stroke, but negative workup, July 2017  (d) apixaban dose decreased to 2.5 mg daily [correction: BID] given history of falls but persistently elevated d-dimer  (3) hypercoagulable panel 06/05/2015 including a homocystine level, a screen for the lupus anticoagulant, beta 2 glycoprotein studies, anti-cardiolipin antibodies, and studies for the factor V Leiden  and prothrombin gene mutation found no actionable abnormalities  (a) the anticardiolipin IgM antibody 06/05/2015 was 34 ("low medium positive"), repeat 07/18/2015 was 38  PLAN: Hayley [name] feels better, is moving better, and is more positive. This is all largely  psychological, but she wants to continue to improve and the way to do that of course would involve significant weight loss and then an exercise program. She is considering bariatric surgery. I gave her some names to explore and she will let me know if she wishes to have a referral placed.  Otherwise she will see me again in one year. Unless she loses a significant amount of weight and becomes less the condition plan is to continue apixaban indefinitely  She knows to call for any problems that may develop before her next visit.    Chauncey Cruel, MD   12/20/2016 3:14 PM

## 2017-01-02 ENCOUNTER — Encounter: Payer: Self-pay | Admitting: Neurology

## 2017-01-02 ENCOUNTER — Ambulatory Visit (INDEPENDENT_AMBULATORY_CARE_PROVIDER_SITE_OTHER): Payer: Medicare Other | Admitting: Neurology

## 2017-01-02 VITALS — BP 98/64 | HR 68 | Ht 65.0 in | Wt 234.5 lb

## 2017-01-02 DIAGNOSIS — F488 Other specified nonpsychotic mental disorders: Secondary | ICD-10-CM | POA: Insufficient documentation

## 2017-01-02 DIAGNOSIS — G43709 Chronic migraine without aura, not intractable, without status migrainosus: Secondary | ICD-10-CM | POA: Diagnosis not present

## 2017-01-02 DIAGNOSIS — IMO0002 Reserved for concepts with insufficient information to code with codable children: Secondary | ICD-10-CM

## 2017-01-02 DIAGNOSIS — R251 Tremor, unspecified: Secondary | ICD-10-CM

## 2017-01-02 DIAGNOSIS — G2589 Other specified extrapyramidal and movement disorders: Secondary | ICD-10-CM

## 2017-01-02 MED ORDER — ONABOTULINUMTOXINA 100 UNITS IJ SOLR
200.0000 [IU] | Freq: Once | INTRAMUSCULAR | Status: AC
  Administered 2017-01-02: 200 [IU] via INTRAMUSCULAR

## 2017-01-02 NOTE — Progress Notes (Signed)
Chief Complaint  Patient presents with  . Migraine    Botox 200 units - office supply      PATIENT: Hayley [name] DOB: 06/17/1946  Chief Complaint  Patient presents with  . Migraine    Botox 200 units - office supply     HISTORICAL  Hayley [name] is a 71 years old right-handed female, seen in refer by hand surgeon Dr. Daryll Brod, and her primary care PA Flora Lipps for evaluation of neck pain, radiating pain to bilateral shoulder, upper extremity  She has past medical history of spasmodic dysphonia, status post surgical correction, also had a history of hypertension, diabetes, DVT, PE following surgical procedure, most recent was right knee replacement in March 2016, is taking anticoagulation pradaxa, chronic low back pain, previous history of epidural injection for low back pain.  She fell from law mower in June 2016, landed on her back, she was able to got up, finished mowing her lawn, later that day, she noticed worsening low back pain, significant neck pain, radiating pain to her bilateral shoulder, upper extremity, hands, she also noticed bilateral hands paresthesia, burning sensation, subjective weakness, she had worsening urinary urgency, wear pads occasionally, mild unbalanced gait, mild constipation  She was evaluated by [facility] orthopedic surgeon Dr. Rolena Infante for her worsening low back pain, reported MRI of lumbar spine at [facility] in July 2016, was told she is not a surgical candidate, she does has multilevel lumbar degenerative disc disease.  She is also concerned about her bilateral hands tremor, getting worse since 2015, most noticeable when she writes, holding utensils, there was no similar family history  UPDATE August 24th 2016: She continues to complain bilateral neck pain, radiating pain to bilateral shoulder, arm, We have reviewed MRI cervical spine (without) demonstrating: 1. At C3-4: disc bulging and uncovertebral joint hypertrophy and facet hypertrophy  with severe left foraminal stenosis 2. At C4-5: disc bulging and uncovertebral joint hypertrophy with severe biforaminal stenosis 3. At C5-6: disc bulging and uncovertebral joint hypertrophy with moderate right and severe left foraminal stenosis 4. At C6-7: disc bulging and uncovertebral joint hypertrophy with moderate biforaminal stenosis 5. No intrinsic or compressive spinal cord lesions  Propanolol 40 mg twice a day has helped her tremor, she denies significant side effect.  UPDATE June 26th 2017: She continue complains of bilateral lower extremity posturing tremor, significant neck pain, radiating pain to bilateral upper extremity, increased unsteady gait, she has fell few times, worsening bilateral lower extremity paresthesia  UPDATE Sept 1 2017: She has worsening low back pain, it gets worse with any movement, difficulty changing bed linen, she is no longer cooking,   She recently had spinal stimulator trial by [facility] by Dr. Delfino Lovett Rauck,,was deemed to be a potential candidate, she did responding well to the spinal stimulator. She complains of midline constant low back pain, radiating down to right hip, right leg and left leg, getting worse with movement, she has bladder urgency, no bowel incontinence.   She had physical therapy, last in July 2017,    She also complains of migraine, seems to get more frequent since March 2017, she is now having headaches 3-5/week, lasting for 4 hours or longer, with light noise sensitivity nauseous,  She used to get Botox injection as migraine prevention from Dr. Samuel Germany, responded very well, last injection was in 2015, responding well. She is taking Imitrex, Zofran as needed  She is on antibiotics for chronic UTI,  Update June 20 2016: She came  in for Botox injection as migraine prevention, also complains worsening bilateral hands tremor, hope to received EMG guided Botox injection for her right hand tremor.  UPDATE Sep 27 2015: She had sudden onset of left shoulder pain, neck pain, I personally reviewed x-ray of the cervical spine, evidence of multilevel degenerative disc disease most severe C5-6 C6-7, mild to moderate bilateral foraminal stenosis  She responded well to previous Botox injection in October 2017, reported 75% improvement, over past 3 months, used Imitrex injection once, 4 tablets of Imitrex 25 mg, works well,  She was not sure about the benefit of EMG guided botulism toxin injection for her right hand tremor,  UPDATE December 19 2016: She complains of frequent headaches since December 16 2016, bilateral occipital region, pressure, pounding, nauseous, light sensitivity, she still complains 7 out of 10 headaches, despite multiple dose of Imitrex 25 mg tablets, and Imitrex injection, she has used up all her supplies, she also noticed increased bilateral hands tremor, feeling electronic sensation underneath her skin, to her shoulder down to her arms and legs, it is out of her control, but she denied anxiety or depression,  She has self adjusted her medication recently, she stopped the Wellbutrin by herself for a while, started about 3 weeks ago, also has run out of propanolol,  Husband also reported that she spent most of time in her bed, not feeling well.  UPDATE January 02 2017: Her headache overall is under good control, return for Botox injection as migraine prevention, also extra injection for her right hand tremor, writer's cramp  REVIEW OF SYSTEMS: Full 14 system review of systems performed and notable only for above   ALLERGIES: Allergies  Allergen Reactions  . Bee Venom Anaphylaxis  . Tape Rash    Rash with paper tape  . Etodolac Diarrhea and Nausea And Vomiting  . Morphine Other (See Comments)    Hallucinations  . Other Itching and Swelling  . Poison Ivy Extract  [Poison Ivy Extract] Itching and Swelling  . Propoxyphene Nausea And Vomiting  . Propoxyphene N-Acetaminophen Nausea And Vomiting   . Penicillins Rash    Has patient had a PCN reaction causing immediate rash, facial/tongue/throat swelling, SOB or lightheadedness with hypotension: Yes Has patient had a PCN reaction causing severe rash involving mucus membranes or skin necrosis: No Has patient had a PCN reaction that required hospitalization No Has patient had a PCN reaction occurring within the last 10 years: No If all of the above answers are "NO", then may proceed with Cephalosporin use.     HOME MEDICATIONS: Current Outpatient Prescriptions  Medication Sig Dispense Refill  . albuterol (PROVENTIL HFA;VENTOLIN HFA) 108 (90 BASE) MCG/ACT inhaler Inhale 1 puff into the lungs every 6 (six) hours as needed for wheezing or shortness of breath.    [name] albuterol (PROVENTIL) (2.5 MG/3ML) 0.083% nebulizer solution Take 2.5 mg by nebulization every 6 (six) hours as needed for wheezing or shortness of breath.    . benzonatate (TESSALON) 100 MG capsule Take 100 mg by mouth 3 (three) times daily as needed for cough.    . clonazePAM (KLONOPIN) 0.5 MG tablet Take 0.5 mg by mouth at bedtime.    . cyclobenzaprine (FLEXERIL) 10 MG tablet Take 10 mg by mouth at bedtime.     [name] EPINEPHrine (EPIPEN) 0.3 mg/0.3 mL DEVI Inject 0.3 mg into the muscle once as needed (Allergic reaction).     . fluorometholone (FML) 0.1 % ophthalmic suspension Place 1 drop into both eyes daily  as needed (Eye allergies).   0  . fluticasone (FLONASE) 50 MCG/ACT nasal spray Place 1 spray into both nostrils daily as needed for allergies or rhinitis.    [name] gabapentin (NEURONTIN) 800 MG tablet Take 800-1,600 mg by mouth 2 (two) times daily. 800 in the morning and 1600 at pm    . HYDROcodone-acetaminophen (NORCO/VICODIN) 5-325 MG per tablet Take one to two tablets by mouth every 4 hours as needed for pain. Do not exceed 4gm of Tylenol in 24 hours 360 tablet 0  . levothyroxine (SYNTHROID, LEVOTHROID) 25 MCG tablet Take 25 mcg by mouth every morning.     . methocarbamol  (ROBAXIN) 500 MG tablet Take 1 tablet (500 mg total) by mouth every 6 (six) hours as needed for muscle spasms. 90 tablet 0  . mometasone-formoterol (DULERA) 100-5 MCG/ACT AERO Inhale 2 puffs into the lungs 2 (two) times daily.    [name] nystatin (MYCOSTATIN) 100000 UNIT/ML suspension Take 5 mLs by mouth daily as needed (Yeast).    . ondansetron (ZOFRAN) 4 MG tablet Take 4 mg by mouth every 8 (eight) hours as needed for nausea or vomiting.    . ondansetron (ZOFRAN-ODT) 8 MG disintegrating tablet Take 8 mg by mouth every 8 (eight) hours as needed for nausea or vomiting.    [name] oxyCODONE (OXY IR/ROXICODONE) 5 MG immediate release tablet Take 1-2 tablets (5-10 mg total) by mouth every 3 (three) hours as needed for moderate pain or severe pain. 90 tablet 0  . pantoprazole (PROTONIX) 40 MG tablet Take 40 mg by mouth daily.    . Phenazopyridine HCl (AZO TABS PO) Take 1 tablet by mouth daily as needed (UTI).    . primidone (MYSOLINE) 50 MG tablet Take 50 mg by mouth at bedtime.    . promethazine (PHENERGAN) 25 MG tablet Take 25 mg by mouth every 6 (six) hours as needed for nausea or vomiting.    . Rivaroxaban (XARELTO) 15 MG TABS tablet Take 1 tablet (15 mg total) by mouth 2 (two) times daily with a meal. 60 tablet 5  . rizatriptan (MAXALT) 10 MG tablet Take 10 mg by mouth as needed for migraine (migraine). May repeat in 2 hours if needed    . sertraline (ZOLOFT) 100 MG tablet Take 100 mg by mouth daily.    . sitaGLIPtin (JANUVIA) 100 MG tablet Take 100 mg by mouth every evening.     . SUMAtriptan (IMITREX) 6 MG/0.5ML SOLN injection Inject 6 mg into the skin every 2 (two) hours as needed for migraine or headache. May repeat in 2 hours if headache persists or recurs.    . valACYclovir (VALTREX) 1000 MG tablet Take 2,000 mg by mouth 2 (two) times daily as needed (Fever blisters).   2     PAST MEDICAL HISTORY: Past Medical History:  Diagnosis Date  . Anemia   . Anxiety   . Bronchitis    hx of   . Clotting  disorder ([place]) 02-17-14   S/p appendectomy-developed Pulmonary emboli-tx. warfarin-d/c 1 yr ago.  . Colon polyps   . Depression   . Diabetes mellitus without complication ([place])    "borderline"- oral med  . Disorder of vocal cords    spasmotic dysphonia ,02-17-14 has" whispery voice-low tone"  . Hyperthyroidism   . Kidney stones   . Migraines   . Nausea Nov 15, 2014   cycle of migraine headaches and nausea  . Neuromuscular disorder ([place])    hands and throat"spasmodic dysphonia", tremors - thimb and forefinger   .  Obesity, Class III, BMI 40-49.9 (morbid obesity) ([place]) 04/27/2010   02-17-14 reports some weight loss- intentional  . Osteoarthritis    in back(chronic pain)  . Pelvic pain    02-17-14 "states thinks its back related on left groin"  . Pulmonary emboli [facility])    s/p Appendectomy '13 [facility]  . Reflux   . Right knee pain   . Sleep apnea    cpap - settings at 4   . Toxic goiter     PAST SURGICAL HISTORY: Past Surgical History:  Procedure Laterality Date  . ABDOMINAL HYSTERECTOMY    . APPENDECTOMY     3'13 [facility] s/p developed  Pulmonary Emboli  . BOTOX INJECTION     for migranes  . CATARACT EXTRACTION, BILATERAL Bilateral 03/2013   . CHOLECYSTECTOMY    . HAND SURGERY  2004   both hands  . KIDNEY STONE SURGERY    . KNEE ARTHROSCOPY  1998   right  . LAPAROSCOPIC APPENDECTOMY  11/27/2011   Procedure: APPENDECTOMY LAPAROSCOPIC;  Surgeon: Adin Hector, MD;  Location: [facility]S;  Service: General;  Laterality: N/A;  . SPINAL CORD STIMULATOR IMPLANT  06/04/2016   Dr. Alfonse Ras at [facility]  . TOTAL KNEE ARTHROPLASTY Right 12/06/2014   Procedure: RIGHT TOTAL KNEE ARTHROPLASTY;  Surgeon: Gaynelle Arabian, MD;  Location: [facility];  Service: Orthopedics;  Laterality: Right;  . TOTAL KNEE REVISION Left 02/24/2014   Procedure: LEFT TOTAL KNEE REVISION;  Surgeon: Gearlean Alf, MD;  Location: [facility];  Service: Orthopedics;  Laterality: Left;  . vocal cord surgery  02-17-14    01-12-14 done in [place] [facility](s/p selective denervation/reinnervation recurrent laryngeal nerve surgery)  . zenkers diverticulum      FAMILY HISTORY: Family History  Problem Relation Age of Onset  . Heart disease Mother   . Pneumonia Father   . Cirrhosis Father   . Hypertension Father   . Alcoholism Father   . Heart attack Paternal Grandfather   . Heart attack Maternal Grandfather   . Heart attack Paternal Grandmother   . Heart attack Maternal Grandmother     SOCIAL HISTORY:  Social History   Social History  . Marital status: Married    Spouse name: N/A  . Number of children: 5  . Years of education: 71   Occupational History  . Retired    Social History Main Topics  . Smoking status: Former Smoker    Quit date: 01/03/1982  . Smokeless tobacco: Never Used  . Alcohol use No  . Drug use: No  . Sexual activity: No   Other Topics Concern  . Not on file   Social History Narrative   Lives at home with husband.   Right-handed.   2 cups caffeine per day.     PHYSICAL EXAM   Vitals:   01/02/17 1423  BP: 98/64  Pulse: 68  Weight: 234 lb 8 oz (106.4 kg)  Height: 5\' 5"  (1.651 m)    Not recorded      Body mass index is 39.02 kg/m.  PHYSICAL EXAMNIATION:  Gen: NAD, conversant, well nourised, obese, well groomed                     Cardiovascular: Regular rate rhythm, no peripheral edema, warm, nontender. Eyes: Conjunctivae clear without exudates or hemorrhage Neck: Supple, no carotid bruise. Pulmonary: Clear to auscultation bilaterally   NEUROLOGICAL EXAM:  MENTAL STATUS: Speech:    Mild dysphonic speech; fluent and spontaneous with  normal comprehension.  Cognition:     Orientation to time, place and person     Normal recent and remote memory     Normal Attention span and concentration     Normal Language, naming, repeating,spontaneous speech     Fund of knowledge   CRANIAL NERVES: CN II: Visual fields are full to confrontation. Fundoscopic  exam is normal with sharp discs and no vascular changes. Pupils are round equal and briskly reactive to light. CN III, IV, VI: extraocular movement are normal. No ptosis. CN V: Facial sensation is intact to pinprick in all 3 divisions bilaterally. Corneal responses are intact.  CN VII: Face is symmetric with normal eye closure and smile. CN VIII: Hearing is normal to rubbing fingers CN IX, X: Palate elevates symmetrically. Phonation is normal. CN XI: Head turning and shoulder shrug are intact CN XII: Tongue is midline with normal movements and no atrophy.  MOTOR: Mild bilateral hands postural tremor, normal muscle tone, bulk, strength, Excessive right hand/wrist flexion/ extension,   REFLEXES: Reflexes are 2+ and symmetric at the biceps, triceps, knees, and ankles. Plantar responses are flexor.  SENSORY: Intact to light touch, pinprick, position sense, and vibration sense are intact in fingers and toes.  COORDINATION: Rapid alternating movements and fine finger movements are intact. There is no dysmetria on finger-to-nose and heel-knee-shin.    GAIT/STANCE: Posture is normal. Gait is cautious, mildly unsteady  DIAGNOSTIC DATA (LABS, IMAGING, TESTING) - I reviewed patient records, labs, notes, testing and imaging myself where available.   ASSESSMENT AND PLAN  Hayley [name] is a 71 y.o. female   Left cervical radiculopathy:  We have reviewed MRI cervical spine 2016 : Multilevel degenerative disc disease, severe foraminal stenosis at left C3-4, bilateral C4-5, left C5-6,:  continue gabapentin e 300 mg 2 tablets 3 times a day    Essential Tremor:  Tried primidone in the past without significant improvement, primidone has the potential interaction with her polypharmacy  Keep propanolol 60 mg twice a day, refilled her medications  Chronic anticoagulation:  History of DVT, PE, following surgical procedure, most recent one was March 2016 following her right knee replacement  On  chronic pradaxa treatment  Worsening bilateral lower extremity paresthesia, gait abnormality, falling episodes  No evidence of large fiber peripheral neuropathy or lumbar sacral radiculopathy on previous electrodiagnostic study,   Status post spinal cord stimulator placement  Chronic migraine headaches  Previously responded well to Botox injection,   BOTOX injection was performed according to protocol by [employer]. 100 units of BOTOX was dissolved into 2 cc NS.   total of 155 units,   Corrugator 2 sites, 10 units Procerus 1 site, 5 unit Frontalis 4 sites,  20 units, Temporalis 8 sites,  40 units  Occipitalis 6 sites, 30 units Cervical Paraspinal, 4 sites, 20 units Trapezius, 6 sites, 30 units  Patient tolerate the injection well. Will return for repeat injection in 3 months.  Extra 45 units was injected under EmG guidance to right hand and right arm to control right hand tremor, writer's cramp  Right pronator teres 15 units Right flexor carpi ulnaris 10 units Right deep finger flexor, 4and 5th finger 5 units Right pronator quadratus 5 units Right extensor indices 5 units Right palmaris longus 5 units     Marcial Pacas, M.D. Ph.D.  [facility] [address], [place], [place] [address] Ph: [phone] Fax: [phone]  CC: To Dr. Daryll Brod, Flora Lipps

## 2017-01-02 NOTE — Progress Notes (Signed)
**  Botox 100 units x 2 vials, NDC 3967-2897-91, Lot [id number], Exp 07/2019, office supply.//mck,rn**

## 2017-01-21 DIAGNOSIS — G8929 Other chronic pain: Secondary | ICD-10-CM | POA: Diagnosis not present

## 2017-01-21 DIAGNOSIS — M5440 Lumbago with sciatica, unspecified side: Secondary | ICD-10-CM | POA: Diagnosis not present

## 2017-03-06 DIAGNOSIS — E119 Type 2 diabetes mellitus without complications: Secondary | ICD-10-CM | POA: Diagnosis not present

## 2017-03-13 ENCOUNTER — Other Ambulatory Visit: Payer: Self-pay | Admitting: Neurology

## 2017-04-16 ENCOUNTER — Other Ambulatory Visit: Payer: Self-pay | Admitting: Neurology

## 2017-04-16 DIAGNOSIS — Q809 Congenital ichthyosis, unspecified: Secondary | ICD-10-CM | POA: Diagnosis not present

## 2017-04-16 DIAGNOSIS — B86 Scabies: Secondary | ICD-10-CM | POA: Diagnosis not present

## 2017-04-23 DIAGNOSIS — G8929 Other chronic pain: Secondary | ICD-10-CM | POA: Diagnosis not present

## 2017-04-23 DIAGNOSIS — Z9689 Presence of other specified functional implants: Secondary | ICD-10-CM | POA: Diagnosis not present

## 2017-04-23 DIAGNOSIS — M5410 Radiculopathy, site unspecified: Secondary | ICD-10-CM | POA: Diagnosis not present

## 2017-04-23 DIAGNOSIS — G894 Chronic pain syndrome: Secondary | ICD-10-CM | POA: Diagnosis not present

## 2017-04-23 DIAGNOSIS — M5442 Lumbago with sciatica, left side: Secondary | ICD-10-CM | POA: Diagnosis not present

## 2017-04-24 ENCOUNTER — Telehealth: Payer: Self-pay | Admitting: Neurology

## 2017-04-24 NOTE — Telephone Encounter (Signed)
Spoke to pt after speaking with [facility] that there are refills available.  Pt has to call for these and insurance states for only 30 days supply.  Pt verbalized understanding.

## 2017-04-24 NOTE — Telephone Encounter (Signed)
Patient requesting cyclobenzaprine (FLEXERIL) 10 MG tablet called to [facility].

## 2017-05-14 DIAGNOSIS — R05 Cough: Secondary | ICD-10-CM | POA: Diagnosis not present

## 2017-06-05 ENCOUNTER — Ambulatory Visit (INDEPENDENT_AMBULATORY_CARE_PROVIDER_SITE_OTHER): Payer: Medicare Other | Admitting: Neurology

## 2017-06-05 ENCOUNTER — Encounter: Payer: Self-pay | Admitting: Neurology

## 2017-06-05 ENCOUNTER — Telehealth: Payer: Self-pay | Admitting: Neurology

## 2017-06-05 VITALS — BP 115/60 | HR 70 | Ht 65.0 in | Wt 243.0 lb

## 2017-06-05 DIAGNOSIS — G43709 Chronic migraine without aura, not intractable, without status migrainosus: Secondary | ICD-10-CM | POA: Diagnosis not present

## 2017-06-05 DIAGNOSIS — IMO0002 Reserved for concepts with insufficient information to code with codable children: Secondary | ICD-10-CM

## 2017-06-05 MED ORDER — CLONAZEPAM 0.5 MG PO TABS: 0.5000 mg | ORAL_TABLET | Freq: Every day | ORAL | 5 refills | Status: DC

## 2017-06-05 MED ORDER — ONDANSETRON 4 MG PO TBDP: 4.0000 mg | ORAL_TABLET | Freq: Three times a day (TID) | ORAL | 11 refills | Status: DC | PRN

## 2017-06-05 MED ORDER — ONDANSETRON 4 MG PO TBDP: 4.0000 mg | ORAL_TABLET | Freq: Three times a day (TID) | ORAL | 4 refills | Status: DC | PRN

## 2017-06-05 MED ORDER — CYCLOBENZAPRINE HCL 10 MG PO TABS: 10.0000 mg | ORAL_TABLET | Freq: Every day | ORAL | 4 refills | Status: DC

## 2017-06-05 MED ORDER — ONABOTULINUMTOXINA 100 UNITS IJ SOLR
200.0000 [IU] | Freq: Once | INTRAMUSCULAR | Status: AC
  Administered 2017-06-05: 200 [IU] via INTRAMUSCULAR

## 2017-06-05 NOTE — Progress Notes (Signed)
**  Botox 100 units x 2 vials, NDC 5953-9672-89, Lot [id number], Exp 12/2019, office supply.//mck,rn**

## 2017-06-05 NOTE — Progress Notes (Signed)
Chief Complaint  Patient presents with  . Migraine    Botox 100 units x 2 vials - office supply      PATIENT: Hayley [name] DOB: 04-23-1946  Chief Complaint  Patient presents with  . Migraine    Botox 100 units x 2 vials - office supply     HISTORICAL  Hayley [name] is a 71 years old right-handed female, seen in refer by hand surgeon Dr. Daryll Brod, and her primary care PA Flora Lipps for evaluation of neck pain, radiating pain to bilateral shoulder, upper extremity  She has past medical history of spasmodic dysphonia, status post surgical correction, also had a history of hypertension, diabetes, DVT, PE following surgical procedure, most recent was right knee replacement in March 2016, is taking anticoagulation pradaxa, chronic low back pain, previous history of epidural injection for low back pain.  She fell from law mower in June 2016, landed on her back, she was able to got up, finished mowing her lawn, later that day, she noticed worsening low back pain, significant neck pain, radiating pain to her bilateral shoulder, upper extremity, hands, she also noticed bilateral hands paresthesia, burning sensation, subjective weakness, she had worsening urinary urgency, wear pads occasionally, mild unbalanced gait, mild constipation  She was evaluated by [facility] orthopedic surgeon Dr. Rolena Infante for her worsening low back pain, reported MRI of lumbar spine at [place] in July 2016, was told she is not a surgical candidate, she does has multilevel lumbar degenerative disc disease.  She is also concerned about her bilateral hands tremor, getting worse since 2015, most noticeable when she writes, holding utensils, there was no similar family history  UPDATE August 24th 2016: She continues to complain bilateral neck pain, radiating pain to bilateral shoulder, arm, We have reviewed MRI cervical spine (without) demonstrating: 1. At C3-4: disc bulging and uncovertebral joint hypertrophy  and facet hypertrophy with severe left foraminal stenosis 2. At C4-5: disc bulging and uncovertebral joint hypertrophy with severe biforaminal stenosis 3. At C5-6: disc bulging and uncovertebral joint hypertrophy with moderate right and severe left foraminal stenosis 4. At C6-7: disc bulging and uncovertebral joint hypertrophy with moderate biforaminal stenosis 5. No intrinsic or compressive spinal cord lesions  Propanolol 40 mg twice a day has helped her tremor, she denies significant side effect.  UPDATE June 26th 2017: She continue complains of bilateral lower extremity posturing tremor, significant neck pain, radiating pain to bilateral upper extremity, increased unsteady gait, she has fell few times, worsening bilateral lower extremity paresthesia  UPDATE Sept 1 2017: She has worsening low back pain, it gets worse with any movement, difficulty changing bed linen, she is no longer cooking,   She recently had spinal stimulator trial by [facility] by Dr. Delfino Lovett Rauck,,was deemed to be a potential candidate, she did responding well to the spinal stimulator. She complains of midline constant low back pain, radiating down to right hip, right leg and left leg, getting worse with movement, she has bladder urgency, no bowel incontinence.   She had physical therapy, last in July 2017,    She also complains of migraine, seems to get more frequent since March 2017, she is now having headaches 3-5/week, lasting for 4 hours or longer, with light noise sensitivity nauseous,  She used to get Botox injection as migraine prevention from Dr. Samuel Germany, responded very well, last injection was in 2015, responding well. She is taking Imitrex, Zofran as needed  She is on antibiotics for chronic UTI,  Update June 20 2016: She came in for Botox injection as migraine prevention, also complains worsening bilateral hands tremor, hope to received EMG guided Botox injection for her right hand  tremor.  UPDATE Sep 27 2015: She had sudden onset of left shoulder pain, neck pain, I personally reviewed x-ray of the cervical spine, evidence of multilevel degenerative disc disease most severe C5-6 C6-7, mild to moderate bilateral foraminal stenosis  She responded well to previous Botox injection in October 2017, reported 75% improvement, over past 3 months, used Imitrex injection once, 4 tablets of Imitrex 25 mg, works well,  She was not sure about the benefit of EMG guided botulism toxin injection for her right hand tremor,  UPDATE December 19 2016: She complains of frequent headaches since December 16 2016, bilateral occipital region, pressure, pounding, nauseous, light sensitivity, she still complains 7 out of 10 headaches, despite multiple dose of Imitrex 25 mg tablets, and Imitrex injection, she has used up all her supplies, she also noticed increased bilateral hands tremor, feeling electronic sensation underneath her skin, to her shoulder down to her arms and legs, it is out of her control, but she denied anxiety or depression,  She has self adjusted her medication recently, she stopped the Wellbutrin by herself for a while, started about 3 weeks ago, also has run out of propanolol,  Husband also reported that she spent most of time in her bed, not feeling well.  UPDATE January 02 2017: Her headache overall is under good control, return for Botox injection as migraine prevention, also extra injection for her right hand tremor, writer's cramp  UPDATE Sept 19 2018: She responded well to previous injection for migraine prevention but did not help her essential tremor much,  REVIEW OF SYSTEMS: Full 14 system review of systems performed and notable only for above   ALLERGIES: Allergies  Allergen Reactions  . Bee Venom Anaphylaxis  . Tape Rash    Rash with paper tape  . Etodolac Diarrhea and Nausea And Vomiting  . Morphine Other (See Comments)    Hallucinations  . Other Itching and  Swelling  . Poison Ivy Extract  [Poison Ivy Extract] Itching and Swelling  . Propoxyphene Nausea And Vomiting  . Propoxyphene N-Acetaminophen Nausea And Vomiting  . Penicillins Rash    Has patient had a PCN reaction causing immediate rash, facial/tongue/throat swelling, SOB or lightheadedness with hypotension: Yes Has patient had a PCN reaction causing severe rash involving mucus membranes or skin necrosis: No Has patient had a PCN reaction that required hospitalization No Has patient had a PCN reaction occurring within the last 10 years: No If all of the above answers are "NO", then may proceed with Cephalosporin use.     HOME MEDICATIONS: Current Outpatient Prescriptions  Medication Sig Dispense Refill  . albuterol (PROVENTIL HFA;VENTOLIN HFA) 108 (90 BASE) MCG/ACT inhaler Inhale 1 puff into the lungs every 6 (six) hours as needed for wheezing or shortness of breath.    [name] albuterol (PROVENTIL) (2.5 MG/3ML) 0.083% nebulizer solution Take 2.5 mg by nebulization every 6 (six) hours as needed for wheezing or shortness of breath.    . benzonatate (TESSALON) 100 MG capsule Take 100 mg by mouth 3 (three) times daily as needed for cough.    . clonazePAM (KLONOPIN) 0.5 MG tablet Take 0.5 mg by mouth at bedtime.    . cyclobenzaprine (FLEXERIL) 10 MG tablet Take 10 mg by mouth at bedtime.     [name] EPINEPHrine (EPIPEN) 0.3 mg/0.3 mL DEVI Inject  0.3 mg into the muscle once as needed (Allergic reaction).     . fluorometholone (FML) 0.1 % ophthalmic suspension Place 1 drop into both eyes daily as needed (Eye allergies).   0  . fluticasone (FLONASE) 50 MCG/ACT nasal spray Place 1 spray into both nostrils daily as needed for allergies or rhinitis.    [name] gabapentin (NEURONTIN) 800 MG tablet Take 800-1,600 mg by mouth 2 (two) times daily. 800 in the morning and 1600 at pm    . HYDROcodone-acetaminophen (NORCO/VICODIN) 5-325 MG per tablet Take one to two tablets by mouth every 4 hours as needed for pain. Do not  exceed 4gm of Tylenol in 24 hours 360 tablet 0  . levothyroxine (SYNTHROID, LEVOTHROID) 25 MCG tablet Take 25 mcg by mouth every morning.     . methocarbamol (ROBAXIN) 500 MG tablet Take 1 tablet (500 mg total) by mouth every 6 (six) hours as needed for muscle spasms. 90 tablet 0  . mometasone-formoterol (DULERA) 100-5 MCG/ACT AERO Inhale 2 puffs into the lungs 2 (two) times daily.    [name] nystatin (MYCOSTATIN) 100000 UNIT/ML suspension Take 5 mLs by mouth daily as needed (Yeast).    . ondansetron (ZOFRAN) 4 MG tablet Take 4 mg by mouth every 8 (eight) hours as needed for nausea or vomiting.    . ondansetron (ZOFRAN-ODT) 8 MG disintegrating tablet Take 8 mg by mouth every 8 (eight) hours as needed for nausea or vomiting.    [name] oxyCODONE (OXY IR/ROXICODONE) 5 MG immediate release tablet Take 1-2 tablets (5-10 mg total) by mouth every 3 (three) hours as needed for moderate pain or severe pain. 90 tablet 0  . pantoprazole (PROTONIX) 40 MG tablet Take 40 mg by mouth daily.    . Phenazopyridine HCl (AZO TABS PO) Take 1 tablet by mouth daily as needed (UTI).    . primidone (MYSOLINE) 50 MG tablet Take 50 mg by mouth at bedtime.    . promethazine (PHENERGAN) 25 MG tablet Take 25 mg by mouth every 6 (six) hours as needed for nausea or vomiting.    . Rivaroxaban (XARELTO) 15 MG TABS tablet Take 1 tablet (15 mg total) by mouth 2 (two) times daily with a meal. 60 tablet 5  . rizatriptan (MAXALT) 10 MG tablet Take 10 mg by mouth as needed for migraine (migraine). May repeat in 2 hours if needed    . sertraline (ZOLOFT) 100 MG tablet Take 100 mg by mouth daily.    . sitaGLIPtin (JANUVIA) 100 MG tablet Take 100 mg by mouth every evening.     . SUMAtriptan (IMITREX) 6 MG/0.5ML SOLN injection Inject 6 mg into the skin every 2 (two) hours as needed for migraine or headache. May repeat in 2 hours if headache persists or recurs.    . valACYclovir (VALTREX) 1000 MG tablet Take 2,000 mg by mouth 2 (two) times daily as  needed (Fever blisters).   2     PAST MEDICAL HISTORY: Past Medical History:  Diagnosis Date  . Anemia   . Anxiety   . Bronchitis    hx of   . Clotting disorder ([place]) 02-17-14   S/p appendectomy-developed Pulmonary emboli-tx. warfarin-d/c 1 yr ago.  . Colon polyps   . Depression   . Diabetes mellitus without complication ([place])    "borderline"- oral med  . Disorder of vocal cords    spasmotic dysphonia ,02-17-14 has" whispery voice-low tone"  . Hyperthyroidism   . Kidney stones   . Migraines   . Nausea  Nov 15, 2014   cycle of migraine headaches and nausea  . Neuromuscular disorder ([place])    hands and throat"spasmodic dysphonia", tremors - thimb and forefinger   . Obesity, Class III, BMI 40-49.9 (morbid obesity) ([place]) 04/27/2010   02-17-14 reports some weight loss- intentional  . Osteoarthritis    in back(chronic pain)  . Pelvic pain    02-17-14 "states thinks its back related on left groin"  . Pulmonary emboli [facility])    s/p Appendectomy '13 [facility]  . Reflux   . Right knee pain   . Sleep apnea    cpap - settings at 4   . Toxic goiter     PAST SURGICAL HISTORY: Past Surgical History:  Procedure Laterality Date  . ABDOMINAL HYSTERECTOMY    . APPENDECTOMY     3'13 [facility] s/p developed  Pulmonary Emboli  . BOTOX INJECTION     for migranes  . CATARACT EXTRACTION, BILATERAL Bilateral 03/2013   . CHOLECYSTECTOMY    . HAND SURGERY  2004   both hands  . KIDNEY STONE SURGERY    . KNEE ARTHROSCOPY  1998   right  . LAPAROSCOPIC APPENDECTOMY  11/27/2011   Procedure: APPENDECTOMY LAPAROSCOPIC;  Surgeon: Adin Hector, MD;  Location: [facility];  Service: General;  Laterality: N/A;  . SPINAL CORD STIMULATOR IMPLANT  06/04/2016   Dr. Alfonse Ras at [facility]  . TOTAL KNEE ARTHROPLASTY Right 12/06/2014   Procedure: RIGHT TOTAL KNEE ARTHROPLASTY;  Surgeon: Gaynelle Arabian, MD;  Location: [facility];  Service: Orthopedics;  Laterality: Right;  . TOTAL KNEE REVISION Left 02/24/2014    Procedure: LEFT TOTAL KNEE REVISION;  Surgeon: Gearlean Alf, MD;  Location: [facility];  Service: Orthopedics;  Laterality: Left;  . vocal cord surgery  02-17-14   01-12-14 done in [place] [facility](s/p selective denervation/reinnervation recurrent laryngeal nerve surgery)  . zenkers diverticulum      FAMILY HISTORY: Family History  Problem Relation Age of Onset  . Heart disease Mother   . Pneumonia Father   . Cirrhosis Father   . Hypertension Father   . Alcoholism Father   . Heart attack Paternal Grandfather   . Heart attack Maternal Grandfather   . Heart attack Paternal Grandmother   . Heart attack Maternal Grandmother     SOCIAL HISTORY:  Social History   Social History  . Marital status: Married    Spouse name: N/A  . Number of children: 5  . Years of education: 65   Occupational History  . Retired    Social History Main Topics  . Smoking status: Former Smoker    Quit date: 01/03/1982  . Smokeless tobacco: Never Used  . Alcohol use No  . Drug use: No  . Sexual activity: No   Other Topics Concern  . Not on file   Social History Narrative   Lives at home with husband.   Right-handed.   2 cups caffeine per day.     PHYSICAL EXAM   Vitals:   06/05/17 1406  BP: 115/60  Pulse: 70  Weight: 243 lb (110.2 kg)  Height: 5\' 5"  (1.651 m)    Not recorded      Body mass index is 40.44 kg/m.  PHYSICAL EXAMNIATION:  Gen: NAD, conversant, well nourised, obese, well groomed                     Cardiovascular: Regular rate rhythm, no peripheral edema, warm, nontender. Eyes: Conjunctivae clear without exudates or  hemorrhage Neck: Supple, no carotid bruise. Pulmonary: Clear to auscultation bilaterally   NEUROLOGICAL EXAM:  MENTAL STATUS: Speech:    Mild dysphonic speech; fluent and spontaneous with normal comprehension.  Cognition:     Orientation to time, place and person     Normal recent and remote memory     Normal Attention span and concentration      Normal Language, naming, repeating,spontaneous speech     Fund of knowledge   CRANIAL NERVES: CN II: Visual fields are full to confrontation. Fundoscopic exam is normal with sharp discs and no vascular changes. Pupils are round equal and briskly reactive to light. CN III, IV, VI: extraocular movement are normal. No ptosis. CN V: Facial sensation is intact to pinprick in all 3 divisions bilaterally. Corneal responses are intact.  CN VII: Face is symmetric with normal eye closure and smile. CN VIII: Hearing is normal to rubbing fingers CN IX, X: Palate elevates symmetrically. Phonation is normal. CN XI: Head turning and shoulder shrug are intact CN XII: Tongue is midline with normal movements and no atrophy.  MOTOR: Mild bilateral hands postural tremor, normal muscle tone, bulk, strength, Excessive right hand/wrist flexion/ extension,   REFLEXES: Reflexes are 2+ and symmetric at the biceps, triceps, knees, and ankles. Plantar responses are flexor.  SENSORY: Intact to light touch, pinprick, position sense, and vibration sense are intact in fingers and toes.  COORDINATION: Rapid alternating movements and fine finger movements are intact. There is no dysmetria on finger-to-nose and heel-knee-shin.    GAIT/STANCE: Posture is normal. Gait is cautious, mildly unsteady  DIAGNOSTIC DATA (LABS, IMAGING, TESTING) - I reviewed patient records, labs, notes, testing and imaging myself where available.   ASSESSMENT AND PLAN  LATORI BEGGS is a 71 y.o. female   Left cervical radiculopathy:  We have reviewed MRI cervical spine 2016 : Multilevel degenerative disc disease, severe foraminal stenosis at left C3-4, bilateral C4-5, left C5-6,:  continue gabapentin e 300 mg 2 tablets 3 times a day    Essential Tremor:  Tried primidone in the past without significant improvement, primidone has the potential interaction with her polypharmacy  Keep propanolol 60 mg twice a day, refilled her  medications  Chronic anticoagulation:  History of DVT, PE, following surgical procedure, most recent one was March 2016 following her right knee replacement  On chronic pradaxa treatment  Worsening bilateral lower extremity paresthesia, gait abnormality, falling episodes  No evidence of large fiber peripheral neuropathy or lumbar sacral radiculopathy on previous electrodiagnostic study,   Status post spinal cord stimulator placement  Chronic migraine headaches  Previously responded well to Botox injection,   BOTOX injection was performed according to protocol by [facility]. 100 units of BOTOX was dissolved into 2 cc NS.   total of 155 units,   Corrugator 2 sites, 10 units Procerus 1 site, 5 unit Frontalis 4 sites,  20 units, Temporalis 8 sites,  40 units  Occipitalis 6 sites, 30 units Cervical Paraspinal, 4 sites, 20 units  Extra 45 units was injected under EmG guidance to right cervical paraspinal muscles     Marcial Pacas, M.D. Ph.D.  [facility] [address] [place], [place] [address] Ph: [phone] Fax: [phone]  CC: To Dr. Daryll Brod, Flora Lipps

## 2017-06-05 NOTE — Telephone Encounter (Signed)
Please call pt to schedule 3 month BOTOX. Thank you. JBA

## 2017-06-10 NOTE — Telephone Encounter (Signed)
I called to schedule the patient, one of her phone number was out of service, she did not answer on the other phone but I left a VM asking her to call me back.

## 2017-06-19 DIAGNOSIS — L309 Dermatitis, unspecified: Secondary | ICD-10-CM | POA: Diagnosis not present

## 2017-06-19 DIAGNOSIS — Z23 Encounter for immunization: Secondary | ICD-10-CM | POA: Diagnosis not present

## 2017-06-19 DIAGNOSIS — L821 Other seborrheic keratosis: Secondary | ICD-10-CM | POA: Diagnosis not present

## 2017-06-20 ENCOUNTER — Other Ambulatory Visit: Payer: Self-pay | Admitting: Neurology

## 2017-06-20 NOTE — Telephone Encounter (Signed)
Call[facility] and [facility] with [facility] Verified patient refilled med yesterday. Has 2 refills remaining.   Note from pharmacy stated pt requested 7 day supply until she can receive from pharmacy. Will e-scribe this.

## 2017-06-24 DIAGNOSIS — G894 Chronic pain syndrome: Secondary | ICD-10-CM | POA: Diagnosis not present

## 2017-06-24 DIAGNOSIS — M5441 Lumbago with sciatica, right side: Secondary | ICD-10-CM | POA: Diagnosis not present

## 2017-06-24 DIAGNOSIS — G8929 Other chronic pain: Secondary | ICD-10-CM | POA: Diagnosis not present

## 2017-06-24 DIAGNOSIS — M5442 Lumbago with sciatica, left side: Secondary | ICD-10-CM | POA: Diagnosis not present

## 2017-06-24 DIAGNOSIS — Z9689 Presence of other specified functional implants: Secondary | ICD-10-CM | POA: Diagnosis not present

## 2017-06-24 DIAGNOSIS — M542 Cervicalgia: Secondary | ICD-10-CM | POA: Diagnosis not present

## 2017-07-02 DIAGNOSIS — M5442 Lumbago with sciatica, left side: Secondary | ICD-10-CM | POA: Diagnosis not present

## 2017-07-03 DIAGNOSIS — J383 Other diseases of vocal cords: Secondary | ICD-10-CM | POA: Diagnosis not present

## 2017-07-03 DIAGNOSIS — Z23 Encounter for immunization: Secondary | ICD-10-CM | POA: Diagnosis not present

## 2017-07-03 DIAGNOSIS — E039 Hypothyroidism, unspecified: Secondary | ICD-10-CM | POA: Diagnosis not present

## 2017-07-03 DIAGNOSIS — L853 Xerosis cutis: Secondary | ICD-10-CM | POA: Diagnosis not present

## 2017-07-03 DIAGNOSIS — L814 Other melanin hyperpigmentation: Secondary | ICD-10-CM | POA: Diagnosis not present

## 2017-07-03 DIAGNOSIS — J385 Laryngeal spasm: Secondary | ICD-10-CM | POA: Diagnosis not present

## 2017-07-03 DIAGNOSIS — Z136 Encounter for screening for cardiovascular disorders: Secondary | ICD-10-CM | POA: Diagnosis not present

## 2017-07-03 DIAGNOSIS — L821 Other seborrheic keratosis: Secondary | ICD-10-CM | POA: Diagnosis not present

## 2017-07-03 DIAGNOSIS — R490 Dysphonia: Secondary | ICD-10-CM | POA: Diagnosis not present

## 2017-07-03 DIAGNOSIS — R05 Cough: Secondary | ICD-10-CM | POA: Diagnosis not present

## 2017-07-03 DIAGNOSIS — Z85828 Personal history of other malignant neoplasm of skin: Secondary | ICD-10-CM | POA: Diagnosis not present

## 2017-07-03 DIAGNOSIS — E119 Type 2 diabetes mellitus without complications: Secondary | ICD-10-CM | POA: Diagnosis not present

## 2017-07-03 DIAGNOSIS — F331 Major depressive disorder, recurrent, moderate: Secondary | ICD-10-CM | POA: Diagnosis not present

## 2017-07-03 DIAGNOSIS — L820 Inflamed seborrheic keratosis: Secondary | ICD-10-CM | POA: Diagnosis not present

## 2017-07-03 DIAGNOSIS — J384 Edema of larynx: Secondary | ICD-10-CM | POA: Diagnosis not present

## 2017-07-03 DIAGNOSIS — Z1211 Encounter for screening for malignant neoplasm of colon: Secondary | ICD-10-CM | POA: Diagnosis not present

## 2017-07-03 DIAGNOSIS — L988 Other specified disorders of the skin and subcutaneous tissue: Secondary | ICD-10-CM | POA: Diagnosis not present

## 2017-07-03 DIAGNOSIS — Z0000 Encounter for general adult medical examination without abnormal findings: Secondary | ICD-10-CM | POA: Diagnosis not present

## 2017-07-03 DIAGNOSIS — Z6839 Body mass index (BMI) 39.0-39.9, adult: Secondary | ICD-10-CM | POA: Diagnosis not present

## 2017-07-03 DIAGNOSIS — B3789 Other sites of candidiasis: Secondary | ICD-10-CM | POA: Diagnosis not present

## 2017-07-03 DIAGNOSIS — E538 Deficiency of other specified B group vitamins: Secondary | ICD-10-CM | POA: Diagnosis not present

## 2017-07-05 DIAGNOSIS — M47817 Spondylosis without myelopathy or radiculopathy, lumbosacral region: Secondary | ICD-10-CM | POA: Diagnosis not present

## 2017-07-05 DIAGNOSIS — M419 Scoliosis, unspecified: Secondary | ICD-10-CM | POA: Diagnosis not present

## 2017-07-05 DIAGNOSIS — M47892 Other spondylosis, cervical region: Secondary | ICD-10-CM | POA: Diagnosis not present

## 2017-07-05 DIAGNOSIS — M9971 Connective tissue and disc stenosis of intervertebral foramina of cervical region: Secondary | ICD-10-CM | POA: Diagnosis not present

## 2017-07-05 DIAGNOSIS — M47896 Other spondylosis, lumbar region: Secondary | ICD-10-CM | POA: Diagnosis not present

## 2017-07-05 DIAGNOSIS — M5135 Other intervertebral disc degeneration, thoracolumbar region: Secondary | ICD-10-CM | POA: Diagnosis not present

## 2017-07-05 DIAGNOSIS — M47812 Spondylosis without myelopathy or radiculopathy, cervical region: Secondary | ICD-10-CM | POA: Diagnosis not present

## 2017-07-05 DIAGNOSIS — M47897 Other spondylosis, lumbosacral region: Secondary | ICD-10-CM | POA: Diagnosis not present

## 2017-07-05 DIAGNOSIS — M5136 Other intervertebral disc degeneration, lumbar region: Secondary | ICD-10-CM | POA: Diagnosis not present

## 2017-07-05 DIAGNOSIS — M47816 Spondylosis without myelopathy or radiculopathy, lumbar region: Secondary | ICD-10-CM | POA: Diagnosis not present

## 2017-07-05 DIAGNOSIS — M2578 Osteophyte, vertebrae: Secondary | ICD-10-CM | POA: Diagnosis not present

## 2017-07-05 DIAGNOSIS — M5031 Other cervical disc degeneration,  high cervical region: Secondary | ICD-10-CM | POA: Diagnosis not present

## 2017-07-05 DIAGNOSIS — M5442 Lumbago with sciatica, left side: Secondary | ICD-10-CM | POA: Diagnosis not present

## 2017-07-15 DIAGNOSIS — R5381 Other malaise: Secondary | ICD-10-CM | POA: Diagnosis not present

## 2017-07-15 DIAGNOSIS — S50811A Abrasion of right forearm, initial encounter: Secondary | ICD-10-CM | POA: Diagnosis not present

## 2017-07-15 DIAGNOSIS — M545 Low back pain: Secondary | ICD-10-CM | POA: Diagnosis not present

## 2017-07-22 ENCOUNTER — Telehealth: Payer: Self-pay | Admitting: *Deleted

## 2017-07-22 MED ORDER — CLONAZEPAM 0.5 MG PO TABS: 0.5000 mg | ORAL_TABLET | Freq: Every day | ORAL | 1 refills | Status: DC

## 2017-07-22 NOTE — Addendum Note (Signed)
Addended by: Noberto Retort C on: 07/22/2017 05:21 PM   Modules accepted: Orders

## 2017-07-22 NOTE — Telephone Encounter (Signed)
Received refill request for clonazepam from [facility].  Rx faxed for 90-day x 1 refill.  Called local pharmacy - [facility] and canceled all remaining refills on file.

## 2017-07-24 ENCOUNTER — Institutional Professional Consult (permissible substitution): Payer: Self-pay | Admitting: Pulmonary Disease

## 2017-07-24 NOTE — Progress Notes (Deleted)
Subjective:    Patient ID: Hayley [name], female    DOB: 08/01/1946, 71 y.o.   MRN: [id number]  Synopsis: Referred in 2018 for***  HPI No chief complaint on file.  ***  Past Medical History:  Diagnosis Date  . Anemia   . Anxiety   . Bronchitis    hx of   . Clotting disorder ([place]) 02-17-14   S/p appendectomy-developed Pulmonary emboli-tx. warfarin-d/c 1 yr ago.  . Colon polyps   . Depression   . Diabetes mellitus without complication ([place])    "borderline"- oral med  . Disorder of vocal cords    spasmotic dysphonia ,02-17-14 has" whispery voice-low tone"  . Hyperthyroidism   . Kidney stones   . Migraines   . Nausea Nov 15, 2014   cycle of migraine headaches and nausea  . Neuromuscular disorder ([place])    hands and throat"spasmodic dysphonia", tremors - thimb and forefinger   . Obesity, Class III, BMI 40-49.9 (morbid obesity) ([place]) 04/27/2010   02-17-14 reports some weight loss- intentional  . Osteoarthritis    in back(chronic pain)  . Pelvic pain    02-17-14 "states thinks its back related on left groin"  . Pulmonary emboli [facility])    s/p Appendectomy '13 [facility]  . Reflux   . Right knee pain   . Sleep apnea    cpap - settings at 4   . Toxic goiter      Family History  Problem Relation Age of Onset  . Heart disease Mother   . Pneumonia Father   . Cirrhosis Father   . Hypertension Father   . Alcoholism Father   . Heart attack Paternal Grandfather   . Heart attack Maternal Grandfather   . Heart attack Paternal Grandmother   . Heart attack Maternal Grandmother      Social History   Socioeconomic History  . Marital status: Married    Spouse name: Not on file  . Number of children: 5  . Years of education: 90  . Highest education level: Not on file  Social Needs  . Financial resource strain: Not on file  . Food insecurity - worry: Not on file  . Food insecurity - inability: Not on file  . Transportation needs - medical: Not on file  . Transportation needs -  non-medical: Not on file  Occupational History  . Occupation: Retired  Tobacco Use  . Smoking status: Former Smoker    Last attempt to quit: 01/03/1982    Years since quitting: 35.5  . Smokeless tobacco: Never Used  Substance and Sexual Activity  . Alcohol use: No  . Drug use: No  . Sexual activity: No  Other Topics Concern  . Not on file  Social History Narrative   Lives at home with husband.   Right-handed.   2 cups caffeine per day.     Allergies  Allergen Reactions  . Bee Venom Anaphylaxis  . Tape Rash    Rash with paper tape  . Etodolac Diarrhea and Nausea And Vomiting  . Morphine Other (See Comments)    Hallucinations  . Other Itching and Swelling  . Poison Ivy Extract  [Poison Ivy Extract] Itching and Swelling  . Propoxyphene Nausea And Vomiting  . Propoxyphene N-Acetaminophen Nausea And Vomiting  . Penicillins Rash    Has patient had a PCN reaction causing immediate rash, facial/tongue/throat swelling, SOB or lightheadedness with hypotension: Yes Has patient had a PCN reaction causing severe rash involving mucus membranes or skin necrosis:  No Has patient had a PCN reaction that required hospitalization No Has patient had a PCN reaction occurring within the last 10 years: No If all of the above answers are "NO", then may proceed with Cephalosporin use.      Outpatient Medications Prior to Visit  Medication Sig Dispense Refill  . albuterol (PROVENTIL HFA;VENTOLIN HFA) 108 (90 BASE) MCG/ACT inhaler Inhale 1 puff into the lungs every 6 (six) hours as needed for wheezing or shortness of breath.    [name] albuterol (PROVENTIL) (2.5 MG/3ML) 0.083% nebulizer solution Take 2.5 mg by nebulization every 6 (six) hours as needed for wheezing or shortness of breath. Reported on 01/10/2016    . apixaban (ELIQUIS) 2.5 MG TABS tablet Take 1 tablet (2.5 mg total) by mouth 2 (two) times daily. 180 tablet 3  . benzonatate (TESSALON) 100 MG capsule Take 100 mg by mouth 2 (two) times daily  as needed for cough.    [name] BIOTIN PO Take 1 capsule by mouth daily.    . Botulinum Toxin Type A (BOTOX) 200 units SOLR Inject as directed every 3 (three) months.    [name] buPROPion (WELLBUTRIN XL) 150 MG 24 hr tablet Take 150 mg by mouth every morning.  0  . cholecalciferol (VITAMIN D) 1000 units tablet Take 1,000 Units by mouth daily.    . clonazePAM (KLONOPIN) 0.5 MG tablet Take 1 tablet (0.5 mg total) at bedtime by mouth. 90 tablet 1  . cyanocobalamin (,VITAMIN B-12,) 1000 MCG/ML injection Inject 1 mL into the skin every 30 (thirty) days.  11  . cyclobenzaprine (FLEXERIL) 10 MG tablet Take 1 tablet (10 mg total) by mouth at bedtime. 90 tablet 4  . diphenhydrAMINE (BENADRYL) 25 MG tablet Take 50 mg by mouth every 6 (six) hours as needed for itching.    . DULoxetine (CYMBALTA) 60 MG capsule TAKE 1 CAPSULE EVERY MORNING 90 capsule 4  . gabapentin (NEURONTIN) 300 MG capsule Take 2 capsules (600 mg total) by mouth 3 (three) times daily. 540 capsule 3  . gabapentin (NEURONTIN) 300 MG capsule TAKE 2 CAPSULES (600 MG TOTAL) BY MOUTH 3 (THREE) TIMES DAILY. 42 capsule 0  . glipiZIDE (GLUCOTROL XL) 5 MG 24 hr tablet Take 5 mg by mouth daily with breakfast.    . levothyroxine (SYNTHROID, LEVOTHROID) 25 MCG tablet Take 25 mcg by mouth every morning.     . metroNIDAZOLE (FLAGYL) 500 MG tablet Take 1 tablet (500 mg total) by mouth 2 (two) times daily. 14 tablet 0  . mometasone-formoterol (DULERA) 100-5 MCG/ACT AERO Inhale 2 puffs into the lungs 2 (two) times daily.    . ondansetron (ZOFRAN-ODT) 4 MG disintegrating tablet Take 1 tablet (4 mg total) by mouth every 8 (eight) hours as needed for nausea or vomiting. 60 tablet 4  . Probiotic Product (ACIDOPHILUS/GOAT MILK) CAPS Take by mouth daily.    . propranolol (INDERAL) 60 MG tablet Take 1 tablet (60 mg total) by mouth 2 (two) times daily. 180 tablet 4  . sitaGLIPtin (JANUVIA) 100 MG tablet Take 100 mg by mouth daily.    . SUMAtriptan (IMITREX) 25 MG tablet Take 1  tablet (25 mg total) by mouth every 2 (two) hours as needed for migraine. May repeat in 2 hours if headache persists or recurs. 10 tablet 0  . SUMAtriptan Succinate Refill 4 MG/0.5ML SOCT Inject 4 mg into the skin as needed. Every 3 months 12 Syringe 4  . terconazole (TERAZOL 7) 0.4 % vaginal cream Place 1 applicator vaginally at bedtime.  45 g 0  . valACYclovir (VALTREX) 1000 MG tablet Take 2,000 mg by mouth 2 (two) times daily as needed (Fever blisters). Reported on 01/10/2016  2  . vitamin B-12 500 MCG tablet Take 1 tablet (500 mcg total) by mouth daily. 30 tablet 0   No facility-administered medications prior to visit.       Review of Systems     Objective:   Physical Exam There were no vitals filed for this visit.  ***   CBC    Component Value Date/Time   WBC 6.4 12/12/2016 1105   WBC 7.5 03/23/2016 0924   RBC 5.02 12/12/2016 1105   RBC 4.70 03/23/2016 0924   HGB 14.1 12/12/2016 1105   HCT 43.5 12/12/2016 1105   PLT 239 12/12/2016 1105   MCV 86.7 12/12/2016 1105   MCH 28.1 12/12/2016 1105   MCH 27.2 03/23/2016 0924   MCHC 32.4 12/12/2016 1105   MCHC 31.3 03/23/2016 0924   RDW 14.4 12/12/2016 1105   LYMPHSABS 1.4 12/12/2016 1105   MONOABS 0.6 12/12/2016 1105   EOSABS 0.3 12/12/2016 1105   BASOSABS 0.0 12/12/2016 1105   BMET    Component Value Date/Time   NA 138 12/12/2016 1105   K 4.6 12/12/2016 1105   CL 101 03/22/2016 1303   CO2 25 12/12/2016 1105   GLUCOSE 234 (H) 12/12/2016 1105   BUN 8.0 12/12/2016 1105   CREATININE 0.8 12/12/2016 1105   CALCIUM 9.4 12/12/2016 1105   GFRNONAA >60 03/22/2016 1258   GFRAA >60 03/22/2016 1258         Assessment & Plan:    No diagnosis found.  Discussion: ***    Current Outpatient Medications:  .  albuterol (PROVENTIL HFA;VENTOLIN HFA) 108 (90 BASE) MCG/ACT inhaler, Inhale 1 puff into the lungs every 6 (six) hours as needed for wheezing or shortness of breath., Disp: , Rfl:  .  albuterol (PROVENTIL) (2.5  MG/3ML) 0.083% nebulizer solution, Take 2.5 mg by nebulization every 6 (six) hours as needed for wheezing or shortness of breath. Reported on 01/10/2016, Disp: , Rfl:  .  apixaban (ELIQUIS) 2.5 MG TABS tablet, Take 1 tablet (2.5 mg total) by mouth 2 (two) times daily., Disp: 180 tablet, Rfl: 3 .  benzonatate (TESSALON) 100 MG capsule, Take 100 mg by mouth 2 (two) times daily as needed for cough., Disp: , Rfl:  .  BIOTIN PO, Take 1 capsule by mouth daily., Disp: , Rfl:  .  Botulinum Toxin Type A (BOTOX) 200 units SOLR, Inject as directed every 3 (three) months., Disp: , Rfl:  .  buPROPion (WELLBUTRIN XL) 150 MG 24 hr tablet, Take 150 mg by mouth every morning., Disp: , Rfl: 0 .  cholecalciferol (VITAMIN D) 1000 units tablet, Take 1,000 Units by mouth daily., Disp: , Rfl:  .  clonazePAM (KLONOPIN) 0.5 MG tablet, Take 1 tablet (0.5 mg total) at bedtime by mouth., Disp: 90 tablet, Rfl: 1 .  cyanocobalamin (,VITAMIN B-12,) 1000 MCG/ML injection, Inject 1 mL into the skin every 30 (thirty) days., Disp: , Rfl: 11 .  cyclobenzaprine (FLEXERIL) 10 MG tablet, Take 1 tablet (10 mg total) by mouth at bedtime., Disp: 90 tablet, Rfl: 4 .  diphenhydrAMINE (BENADRYL) 25 MG tablet, Take 50 mg by mouth every 6 (six) hours as needed for itching., Disp: , Rfl:  .  DULoxetine (CYMBALTA) 60 MG capsule, TAKE 1 CAPSULE EVERY MORNING, Disp: 90 capsule, Rfl: 4 .  gabapentin (NEURONTIN) 300 MG capsule, Take 2 capsules (600 mg  total) by mouth 3 (three) times daily., Disp: 540 capsule, Rfl: 3 .  gabapentin (NEURONTIN) 300 MG capsule, TAKE 2 CAPSULES (600 MG TOTAL) BY MOUTH 3 (THREE) TIMES DAILY., Disp: 42 capsule, Rfl: 0 .  glipiZIDE (GLUCOTROL XL) 5 MG 24 hr tablet, Take 5 mg by mouth daily with breakfast., Disp: , Rfl:  .  levothyroxine (SYNTHROID, LEVOTHROID) 25 MCG tablet, Take 25 mcg by mouth every morning. , Disp: , Rfl:  .  metroNIDAZOLE (FLAGYL) 500 MG tablet, Take 1 tablet (500 mg total) by mouth 2 (two) times daily.,  Disp: 14 tablet, Rfl: 0 .  mometasone-formoterol (DULERA) 100-5 MCG/ACT AERO, Inhale 2 puffs into the lungs 2 (two) times daily., Disp: , Rfl:  .  ondansetron (ZOFRAN-ODT) 4 MG disintegrating tablet, Take 1 tablet (4 mg total) by mouth every 8 (eight) hours as needed for nausea or vomiting., Disp: 60 tablet, Rfl: 4 .  Probiotic Product (ACIDOPHILUS/GOAT MILK) CAPS, Take by mouth daily., Disp: , Rfl:  .  propranolol (INDERAL) 60 MG tablet, Take 1 tablet (60 mg total) by mouth 2 (two) times daily., Disp: 180 tablet, Rfl: 4 .  sitaGLIPtin (JANUVIA) 100 MG tablet, Take 100 mg by mouth daily., Disp: , Rfl:  .  SUMAtriptan (IMITREX) 25 MG tablet, Take 1 tablet (25 mg total) by mouth every 2 (two) hours as needed for migraine. May repeat in 2 hours if headache persists or recurs., Disp: 10 tablet, Rfl: 0 .  SUMAtriptan Succinate Refill 4 MG/0.5ML SOCT, Inject 4 mg into the skin as needed. Every 3 months, Disp: 12 Syringe, Rfl: 4 .  terconazole (TERAZOL 7) 0.4 % vaginal cream, Place 1 applicator vaginally at bedtime., Disp: 45 g, Rfl: 0 .  valACYclovir (VALTREX) 1000 MG tablet, Take 2,000 mg by mouth 2 (two) times daily as needed (Fever blisters). Reported on 01/10/2016, Disp: , Rfl: 2 .  vitamin B-12 500 MCG tablet, Take 1 tablet (500 mcg total) by mouth daily., Disp: 30 tablet, Rfl: 0

## 2017-07-25 ENCOUNTER — Ambulatory Visit
Admission: RE | Admit: 2017-07-25 | Discharge: 2017-07-25 | Disposition: A | Discharge status: Home / Self Care | Payer: Medicare Other | Source: Ambulatory Visit | Attending: Family Medicine | Admitting: Family Medicine

## 2017-07-25 ENCOUNTER — Ambulatory Visit (INDEPENDENT_AMBULATORY_CARE_PROVIDER_SITE_OTHER): Payer: Medicare Other | Admitting: Pulmonary Disease

## 2017-07-25 ENCOUNTER — Other Ambulatory Visit: Payer: Self-pay | Admitting: Family Medicine

## 2017-07-25 ENCOUNTER — Encounter: Payer: Self-pay | Admitting: Pulmonary Disease

## 2017-07-25 VITALS — BP 136/78 | HR 88 | Ht 65.0 in | Wt 239.0 lb

## 2017-07-25 DIAGNOSIS — R05 Cough: Secondary | ICD-10-CM

## 2017-07-25 DIAGNOSIS — R1319 Other dysphagia: Secondary | ICD-10-CM | POA: Diagnosis not present

## 2017-07-25 DIAGNOSIS — R059 Cough, unspecified: Secondary | ICD-10-CM

## 2017-07-25 DIAGNOSIS — Z9989 Dependence on other enabling machines and devices: Secondary | ICD-10-CM

## 2017-07-25 DIAGNOSIS — J479 Bronchiectasis, uncomplicated: Secondary | ICD-10-CM | POA: Diagnosis not present

## 2017-07-25 DIAGNOSIS — G4733 Obstructive sleep apnea (adult) (pediatric): Secondary | ICD-10-CM

## 2017-07-25 NOTE — Patient Instructions (Signed)
Dysphagia (trouble swallowing): I worry that food is going into your trachea We will have a speech therapist perform a procedure called a modified barium swallow test  Cough: I am concerned that you may have bronchiectasis (dilatation/damage/inflammation to the airways) due to recurrent aspiration   OSA: Keep using your CPAP machine nightly  We will see you back in 3-4 weeks or sooner if needed

## 2017-07-25 NOTE — Progress Notes (Signed)
Subjective:    Patient ID: Hayley [name], female    DOB: 18-Jul-1946, 71 y.o.   MRN: [id number]  Synopsis: Referred in 2018 for evaluation of a her cough.   HPI Chief Complaint  Patient presents with  . Advice Only    Referred by Dr. Justin Mend for coughing with green mucous production, CXR 07/25/17   Hayley [name] is here to see me for evaluation of cough.  She says that she has been treated with antibiotics and guaifenesin recently. > she says that when the weather changed recently she had the abrupt onset of cough. 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  She was recently treated for yeast.  She was treated with Diflucan.  She says that lately she has been having trouble swallowing.   > she says that lately she has noticed that she has choked a few times lately with eating and with pills.   > she says that there was a severe choking episode a few weeks back that made her very nervous   She also notes some dyspnea as well.  She attributes the dyspnea to being overweight.   Past Medical History:  Diagnosis Date  . Anemia   . Anxiety   . Bronchitis    hx of   . Clotting disorder ([place]) 02-17-14   S/p appendectomy-developed Pulmonary emboli-tx. warfarin-d/c 1 yr ago.  . Colon polyps   . Depression   . Diabetes mellitus without complication ([place])    "borderline"- oral med  . Disorder of vocal cords    spasmotic dysphonia ,02-17-14 has" whispery voice-low tone"  . Hyperthyroidism   . Kidney stones   . Migraines   .  Nausea Nov 15, 2014   cycle of migraine headaches and nausea  . Neuromuscular disorder ([place])    hands and throat"spasmodic dysphonia", tremors - thimb and forefinger   . Obesity, Class III, BMI 40-49.9 (morbid obesity) ([place]) 04/27/2010   02-17-14 reports some weight loss- intentional  . Osteoarthritis    in back(chronic pain)  . Pelvic pain    02-17-14 "states thinks its back related on left groin"  . Pulmonary emboli [facility])    s/p Appendectomy '13 [facility]  . Reflux   . Right knee pain   . Sleep apnea    cpap - settings at 4   . Toxic goiter      Family History  Problem Relation Age of Onset  . Heart disease Mother   . Pneumonia Father   . Cirrhosis Father   . Hypertension Father   . Alcoholism Father   . Heart attack Paternal Grandfather   . Heart attack Maternal Grandfather   . Heart attack Paternal Grandmother   . Heart attack Maternal Grandmother      Social History   Socioeconomic History  . Marital status: Married    Spouse name: Not on file  . Number of children: 5  . Years of education: 35  . Highest education  level: Not on file  Social Needs  . Financial resource strain: Not on file  . Food insecurity - worry: Not on file  . Food insecurity - inability: Not on file  . Transportation needs - medical: Not on file  . Transportation needs - non-medical: Not on file  Occupational History  . Occupation: Retired  Tobacco Use  . Smoking status: Former Smoker    Last attempt to quit: 01/03/1982    Years since quitting: 35.5  . Smokeless tobacco: Never Used  Substance and Sexual Activity  . Alcohol use: No  . Drug use: No  . Sexual activity: No  Other Topics Concern  . Not on file  Social History Narrative   Lives at home with husband.   Right-handed.   2 cups caffeine per day.     Allergies  Allergen Reactions  . Bee Venom Anaphylaxis  . Tape Rash    Rash with paper tape  . Etodolac Diarrhea and Nausea And Vomiting  . Morphine Other (See Comments)     Hallucinations  . Other Itching and Swelling  . Poison Ivy Extract  [Poison Ivy Extract] Itching and Swelling  . Propoxyphene Nausea And Vomiting  . Propoxyphene N-Acetaminophen Nausea And Vomiting  . Penicillins Rash    Has patient had a PCN reaction causing immediate rash, facial/tongue/throat swelling, SOB or lightheadedness with hypotension: Yes Has patient had a PCN reaction causing severe rash involving mucus membranes or skin necrosis: No Has patient had a PCN reaction that required hospitalization No Has patient had a PCN reaction occurring within the last 10 years: No If all of the above answers are "NO", then may proceed with Cephalosporin use.      Outpatient Medications Prior to Visit  Medication Sig Dispense Refill  . albuterol (PROVENTIL HFA;VENTOLIN HFA) 108 (90 BASE) MCG/ACT inhaler Inhale 1 puff into the lungs every 6 (six) hours as needed for wheezing or shortness of breath.    [name] albuterol (PROVENTIL) (2.5 MG/3ML) 0.083% nebulizer solution Take 2.5 mg by nebulization every 6 (six) hours as needed for wheezing or shortness of breath. Reported on 01/10/2016    . apixaban (ELIQUIS) 2.5 MG TABS tablet Take 1 tablet (2.5 mg total) by mouth 2 (two) times daily. 180 tablet 3  . benzonatate (TESSALON) 100 MG capsule Take 100 mg by mouth 2 (two) times daily as needed for cough.    [name] BIOTIN PO Take 1 capsule by mouth daily.    . Botulinum Toxin Type A (BOTOX) 200 units SOLR Inject as directed every 3 (three) months.    [name] buPROPion (WELLBUTRIN XL) 150 MG 24 hr tablet Take 150 mg by mouth every morning.  0  . cholecalciferol (VITAMIN D) 1000 units tablet Take 1,000 Units by mouth daily.    . clonazePAM (KLONOPIN) 0.5 MG tablet Take 1 tablet (0.5 mg total) at bedtime by mouth. 90 tablet 1  . cyanocobalamin (,VITAMIN B-12,) 1000 MCG/ML injection Inject 1 mL into the skin every 30 (thirty) days.  11  . cyclobenzaprine (FLEXERIL) 10 MG tablet Take 1 tablet (10 mg total) by mouth at  bedtime. 90 tablet 4  . diphenhydrAMINE (BENADRYL) 25 MG tablet Take 50 mg by mouth every 6 (six) hours as needed for itching.    . DULoxetine (CYMBALTA) 60 MG capsule TAKE 1 CAPSULE EVERY MORNING 90 capsule 4  . gabapentin (NEURONTIN) 300 MG capsule Take 2 capsules (600 mg total) by mouth 3 (three) times daily. 540 capsule 3  . gabapentin (  NEURONTIN) 300 MG capsule TAKE 2 CAPSULES (600 MG TOTAL) BY MOUTH 3 (THREE) TIMES DAILY. 42 capsule 0  . glipiZIDE (GLUCOTROL XL) 5 MG 24 hr tablet Take 5 mg by mouth daily with breakfast.    . levothyroxine (SYNTHROID, LEVOTHROID) 25 MCG tablet Take 25 mcg by mouth every morning.     . metroNIDAZOLE (FLAGYL) 500 MG tablet Take 1 tablet (500 mg total) by mouth 2 (two) times daily. (Patient taking differently: Take 500 mg 2 (two) times daily as needed by mouth. ) 14 tablet 0  . mometasone-formoterol (DULERA) 100-5 MCG/ACT AERO Inhale 2 puffs into the lungs 2 (two) times daily.    . ondansetron (ZOFRAN-ODT) 4 MG disintegrating tablet Take 1 tablet (4 mg total) by mouth every 8 (eight) hours as needed for nausea or vomiting. 60 tablet 4  . Probiotic Product (ACIDOPHILUS/GOAT MILK) CAPS Take by mouth daily.    . propranolol (INDERAL) 60 MG tablet Take 1 tablet (60 mg total) by mouth 2 (two) times daily. 180 tablet 4  . sitaGLIPtin (JANUVIA) 100 MG tablet Take 100 mg by mouth daily.    . SUMAtriptan (IMITREX) 25 MG tablet Take 1 tablet (25 mg total) by mouth every 2 (two) hours as needed for migraine. May repeat in 2 hours if headache persists or recurs. 10 tablet 0  . SUMAtriptan Succinate Refill 4 MG/0.5ML SOCT Inject 4 mg into the skin as needed. Every 3 months 12 Syringe 4  . terconazole (TERAZOL 7) 0.4 % vaginal cream Place 1 applicator vaginally at bedtime. 45 g 0  . valACYclovir (VALTREX) 1000 MG tablet Take 2,000 mg by mouth 2 (two) times daily as needed (Fever blisters). Reported on 01/10/2016  2  . vitamin B-12 500 MCG tablet Take 1 tablet (500 mcg total) by  mouth daily. 30 tablet 0   No facility-administered medications prior to visit.       Review of Systems     Objective:   Physical Exam Vitals:   07/25/17 1111  BP: 136/78  Pulse: 88  SpO2: 93%  Weight: 234 lb (106.1 kg)  Height: 5\' 5"  (1.651 m)    Gen: chronically ill appearing, no acute distress HENT: NCAT, OP clear but very dry mucus membranes, neck supple without masses Eyes: PERRL, EOMi Lymph: no cervical lymphadenopathy PULM: Crackles R base more than left B CV: RRR, no mgr, no JVD GI: BS+, soft, nontender, no hsm Derm: no rash or skin breakdown MSK: normal bulk and tone Neuro: A&Ox4, CN II-XII intact, strength 5/5 in all 4 extremities Psyche: normal mood and affect    CBC    Component Value Date/Time   WBC 6.4 12/12/2016 1105   WBC 7.5 03/23/2016 0924   RBC 5.02 12/12/2016 1105   RBC 4.70 03/23/2016 0924   HGB 14.1 12/12/2016 1105   HCT 43.5 12/12/2016 1105   PLT 239 12/12/2016 1105   MCV 86.7 12/12/2016 1105   MCH 28.1 12/12/2016 1105   MCH 27.2 03/23/2016 0924   MCHC 32.4 12/12/2016 1105   MCHC 31.3 03/23/2016 0924   RDW 14.4 12/12/2016 1105   LYMPHSABS 1.4 12/12/2016 1105   MONOABS 0.6 12/12/2016 1105   EOSABS 0.3 12/12/2016 1105   BASOSABS 0.0 12/12/2016 1105   BMET    Component Value Date/Time   NA 138 12/12/2016 1105   K 4.6 12/12/2016 1105   CL 101 03/22/2016 1303   CO2 25 12/12/2016 1105   GLUCOSE 234 (H) 12/12/2016 1105   BUN 8.0 12/12/2016 1105  CREATININE 0.8 12/12/2016 1105   CALCIUM 9.4 12/12/2016 1105   GFRNONAA >60 03/22/2016 1258   GFRAA >60 03/22/2016 1258    Records reviewed, she has been followed by Dr. Dennison Mascot right with [facility] for vocal cord atrophy.  This has been treated with intermittent Botox injections, she also underwent a selective laryngeal abductor denervation reinnervation procedure at [facility] by Dr. Lavone Neri.  Afterwards she reported some difficulty with breathing.  At [facility] she has been  noted to have reduced mobility of the right ventricle vocal cord and atrophy left greater than right.  She has undergone injection augmentation using transplanted fat, last was in October 2015.  October 2018 exam performed by Dr. Joya Gaskins showed known abductor spasmodic dysphonia tremor and laryngeal candidiasis with purulent secretions covering the mucosa of the larynx and pharynx.  Chest imaging: 11/2014 CT chest images independently reviewed showing subsegmental atelectasis in the bases, I question some mucous plugging, no clear bronchiectasis     Assessment & Plan:    Bronchiectasis without complication ([place]) - Plan: CT Chest High Resolution  Other dysphagia - Plan: SLP modified barium swallow  Cough  Discussion: Dalicia presents today for recurrent episodes of cough with mucus production.  She has a long history of vocal cord atrophy, abductor spasmodic dysphonia, vocal cord tremor.  Clearly her vocal cord abnormalities will lead to cough cause cough.  However, my concern is that they may have contributed to aspiration pneumonia or perhaps bronchiectasis along the way.  We know that patients who have vocal cord abnormality such as hers can aspirate, sometimes aspiration.  Considering the worsening nature of her cough and the mucus plugging I saw on the CT chest from 2016 in the dependent areas of the lung, I worry that she may be developing bronchiectasis.  Considering her "choking" episodes recently I also question whether or not she could have silent pharyngeal phase dysphasia with aspiration.  We will have speech therapy evaluate further for this.  She is compliant with CPAP.  It likely contributes to her dry mouth.  Dysphagia (trouble swallowing): I worry that food is going into your trachea We will have a speech therapist perform a procedure called a modified barium swallow test  Cough: I am concerned that you may have bronchiectasis (dilatation/damage/inflammation to the airways) due  to recurrent aspiration   OSA: Keep using your CPAP machine nightly  We will see you back in 3-4 weeks or sooner if needed      Current Outpatient Medications:  .  albuterol (PROVENTIL HFA;VENTOLIN HFA) 108 (90 BASE) MCG/ACT inhaler, Inhale 1 puff into the lungs every 6 (six) hours as needed for wheezing or shortness of breath., Disp: , Rfl:  .  albuterol (PROVENTIL) (2.5 MG/3ML) 0.083% nebulizer solution, Take 2.5 mg by nebulization every 6 (six) hours as needed for wheezing or shortness of breath. Reported on 01/10/2016, Disp: , Rfl:  .  apixaban (ELIQUIS) 2.5 MG TABS tablet, Take 1 tablet (2.5 mg total) by mouth 2 (two) times daily., Disp: 180 tablet, Rfl: 3 .  benzonatate (TESSALON) 100 MG capsule, Take 100 mg by mouth 2 (two) times daily as needed for cough., Disp: , Rfl:  .  BIOTIN PO, Take 1 capsule by mouth daily., Disp: , Rfl:  .  Botulinum Toxin Type A (BOTOX) 200 units SOLR, Inject as directed every 3 (three) months., Disp: , Rfl:  .  buPROPion (WELLBUTRIN XL) 150 MG 24 hr tablet, Take 150 mg by mouth every morning., Disp: ,  Rfl: 0 .  cholecalciferol (VITAMIN D) 1000 units tablet, Take 1,000 Units by mouth daily., Disp: , Rfl:  .  clonazePAM (KLONOPIN) 0.5 MG tablet, Take 1 tablet (0.5 mg total) at bedtime by mouth., Disp: 90 tablet, Rfl: 1 .  cyanocobalamin (,VITAMIN B-12,) 1000 MCG/ML injection, Inject 1 mL into the skin every 30 (thirty) days., Disp: , Rfl: 11 .  cyclobenzaprine (FLEXERIL) 10 MG tablet, Take 1 tablet (10 mg total) by mouth at bedtime., Disp: 90 tablet, Rfl: 4 .  diphenhydrAMINE (BENADRYL) 25 MG tablet, Take 50 mg by mouth every 6 (six) hours as needed for itching., Disp: , Rfl:  .  DULoxetine (CYMBALTA) 60 MG capsule, TAKE 1 CAPSULE EVERY MORNING, Disp: 90 capsule, Rfl: 4 .  gabapentin (NEURONTIN) 300 MG capsule, Take 2 capsules (600 mg total) by mouth 3 (three) times daily., Disp: 540 capsule, Rfl: 3 .  gabapentin (NEURONTIN) 300 MG capsule, TAKE 2 CAPSULES  (600 MG TOTAL) BY MOUTH 3 (THREE) TIMES DAILY., Disp: 42 capsule, Rfl: 0 .  glipiZIDE (GLUCOTROL XL) 5 MG 24 hr tablet, Take 5 mg by mouth daily with breakfast., Disp: , Rfl:  .  levothyroxine (SYNTHROID, LEVOTHROID) 25 MCG tablet, Take 25 mcg by mouth every morning. , Disp: , Rfl:  .  metroNIDAZOLE (FLAGYL) 500 MG tablet, Take 1 tablet (500 mg total) by mouth 2 (two) times daily. (Patient taking differently: Take 500 mg 2 (two) times daily as needed by mouth. ), Disp: 14 tablet, Rfl: 0 .  mometasone-formoterol (DULERA) 100-5 MCG/ACT AERO, Inhale 2 puffs into the lungs 2 (two) times daily., Disp: , Rfl:  .  ondansetron (ZOFRAN-ODT) 4 MG disintegrating tablet, Take 1 tablet (4 mg total) by mouth every 8 (eight) hours as needed for nausea or vomiting., Disp: 60 tablet, Rfl: 4 .  Probiotic Product (ACIDOPHILUS/GOAT MILK) CAPS, Take by mouth daily., Disp: , Rfl:  .  propranolol (INDERAL) 60 MG tablet, Take 1 tablet (60 mg total) by mouth 2 (two) times daily., Disp: 180 tablet, Rfl: 4 .  sitaGLIPtin (JANUVIA) 100 MG tablet, Take 100 mg by mouth daily., Disp: , Rfl:  .  SUMAtriptan (IMITREX) 25 MG tablet, Take 1 tablet (25 mg total) by mouth every 2 (two) hours as needed for migraine. May repeat in 2 hours if headache persists or recurs., Disp: 10 tablet, Rfl: 0 .  SUMAtriptan Succinate Refill 4 MG/0.5ML SOCT, Inject 4 mg into the skin as needed. Every 3 months, Disp: 12 Syringe, Rfl: 4 .  terconazole (TERAZOL 7) 0.4 % vaginal cream, Place 1 applicator vaginally at bedtime., Disp: 45 g, Rfl: 0 .  valACYclovir (VALTREX) 1000 MG tablet, Take 2,000 mg by mouth 2 (two) times daily as needed (Fever blisters). Reported on 01/10/2016, Disp: , Rfl: 2 .  vitamin B-12 500 MCG tablet, Take 1 tablet (500 mcg total) by mouth daily., Disp: 30 tablet, Rfl: 0

## 2017-07-25 NOTE — Progress Notes (Signed)
   Subjective:    Patient ID: Hayley [name], female    DOB: 03-23-1946, 71 y.o.   MRN: [id number]  HPI    Review of Systems  HENT: Positive for congestion, dental problem, ear pain, sinus pressure, sore throat and trouble swallowing.   Respiratory: Positive for cough, chest tightness, shortness of breath and wheezing.   Gastrointestinal: Positive for nausea.  Allergic/Immunologic: Positive for environmental allergies.  Neurological: Positive for headaches.  Hematological: Bruises/bleeds easily.  Psychiatric/Behavioral: The patient is nervous/anxious.        Objective:   Physical Exam        Assessment & Plan:

## 2017-07-29 ENCOUNTER — Other Ambulatory Visit (HOSPITAL_COMMUNITY): Payer: Self-pay | Admitting: Pulmonary Disease

## 2017-07-29 DIAGNOSIS — R1310 Dysphagia, unspecified: Secondary | ICD-10-CM

## 2017-07-31 ENCOUNTER — Ambulatory Visit (HOSPITAL_COMMUNITY): Payer: Medicare Other

## 2017-08-01 ENCOUNTER — Telehealth: Payer: Self-pay | Admitting: Neurology

## 2017-08-01 NOTE — Telephone Encounter (Signed)
I called [facility] and was given the following information for her sumatriptan injections:  1) local pharmacy estimated cost for one pre-filled syringe - $29.50 2) mail order pharmacy estimated cost for three pre-filled syringes - $116.64.  The patient is aware of her cost and is unable to afford the medication.  She is having financial hardship right now and would like to know if there is any patient assistance available.

## 2017-08-01 NOTE — Telephone Encounter (Signed)
Pt called she said 6 boxes of imitrex cost about $300, she is unable to pay this. Please call to discuss

## 2017-08-02 ENCOUNTER — Ambulatory Visit (HOSPITAL_COMMUNITY)
Admission: RE | Admit: 2017-08-02 | Discharge: 2017-08-02 | Disposition: A | Discharge status: Home / Self Care | Payer: Medicare Other | Source: Ambulatory Visit | Attending: Pulmonary Disease | Admitting: Pulmonary Disease

## 2017-08-02 DIAGNOSIS — K760 Fatty (change of) liver, not elsewhere classified: Secondary | ICD-10-CM | POA: Insufficient documentation

## 2017-08-02 DIAGNOSIS — I2510 Atherosclerotic heart disease of native coronary artery without angina pectoris: Secondary | ICD-10-CM | POA: Diagnosis not present

## 2017-08-02 DIAGNOSIS — R0602 Shortness of breath: Secondary | ICD-10-CM | POA: Diagnosis not present

## 2017-08-02 DIAGNOSIS — I700 Atherosclerosis of aorta: Secondary | ICD-10-CM | POA: Diagnosis not present

## 2017-08-02 DIAGNOSIS — J479 Bronchiectasis, uncomplicated: Secondary | ICD-10-CM | POA: Diagnosis not present

## 2017-08-02 NOTE — Telephone Encounter (Signed)
I am working to see what I can to do to help patient. Patient is aware.

## 2017-08-07 DIAGNOSIS — M549 Dorsalgia, unspecified: Secondary | ICD-10-CM | POA: Diagnosis not present

## 2017-08-07 DIAGNOSIS — Z6841 Body Mass Index (BMI) 40.0 and over, adult: Secondary | ICD-10-CM | POA: Diagnosis not present

## 2017-08-07 DIAGNOSIS — K7581 Nonalcoholic steatohepatitis (NASH): Secondary | ICD-10-CM | POA: Diagnosis not present

## 2017-08-07 DIAGNOSIS — G8929 Other chronic pain: Secondary | ICD-10-CM | POA: Diagnosis not present

## 2017-08-07 DIAGNOSIS — E119 Type 2 diabetes mellitus without complications: Secondary | ICD-10-CM | POA: Diagnosis not present

## 2017-08-12 ENCOUNTER — Ambulatory Visit (HOSPITAL_COMMUNITY): Payer: Medicare Other

## 2017-08-12 NOTE — Telephone Encounter (Addendum)
I have Reached out to several places and and they don't have any patient assistance programs any more for SUMAtriptan Succinate Refill 4 MG/0.5ML SOCT Or Imitrex Tablets .  I asked patient if she had been in the [employer] she relayed to me that her husband had been . I relayed to her she could go to the the [employer] and They could help her and husband with food medication benefits . They would just have to take a day and go sign up. I provided address and telephone number. I mailed Patient GOOD RX Card With Locations where she could go.

## 2017-08-15 ENCOUNTER — Telehealth: Payer: Self-pay | Admitting: Pulmonary Disease

## 2017-08-15 NOTE — Telephone Encounter (Signed)
Spoke with pt and she stated she would like a copy of her CT scan mailed to her home address and faxed to Dr. Tawni Pummel at [place]. Ct sent and faxed to Dr. Justin Mend. Nothing further is needed.

## 2017-08-22 ENCOUNTER — Other Ambulatory Visit (HOSPITAL_COMMUNITY): Payer: Self-pay | Admitting: Pulmonary Disease

## 2017-08-22 ENCOUNTER — Ambulatory Visit: Payer: Self-pay | Admitting: Pulmonary Disease

## 2017-08-22 DIAGNOSIS — R1319 Other dysphagia: Secondary | ICD-10-CM

## 2017-08-28 ENCOUNTER — Ambulatory Visit (HOSPITAL_COMMUNITY): Admission: RE | Admit: 2017-08-28 | Payer: Medicare Other | Source: Ambulatory Visit

## 2017-08-28 ENCOUNTER — Other Ambulatory Visit (HOSPITAL_COMMUNITY): Payer: Self-pay | Admitting: Pulmonary Disease

## 2017-08-28 ENCOUNTER — Ambulatory Visit (HOSPITAL_COMMUNITY): Payer: Medicare Other

## 2017-08-28 DIAGNOSIS — R1310 Dysphagia, unspecified: Secondary | ICD-10-CM

## 2017-09-02 DIAGNOSIS — Z9889 Other specified postprocedural states: Secondary | ICD-10-CM | POA: Diagnosis not present

## 2017-09-02 DIAGNOSIS — Z9181 History of falling: Secondary | ICD-10-CM | POA: Diagnosis not present

## 2017-09-02 DIAGNOSIS — J3489 Other specified disorders of nose and nasal sinuses: Secondary | ICD-10-CM | POA: Diagnosis not present

## 2017-09-02 DIAGNOSIS — R490 Dysphonia: Secondary | ICD-10-CM | POA: Diagnosis not present

## 2017-09-02 DIAGNOSIS — J383 Other diseases of vocal cords: Secondary | ICD-10-CM | POA: Diagnosis not present

## 2017-09-02 DIAGNOSIS — J385 Laryngeal spasm: Secondary | ICD-10-CM | POA: Diagnosis not present

## 2017-09-04 ENCOUNTER — Ambulatory Visit (HOSPITAL_COMMUNITY): Admission: RE | Admit: 2017-09-04 | Payer: Self-pay | Source: Ambulatory Visit

## 2017-09-04 ENCOUNTER — Ambulatory Visit: Payer: Self-pay | Admitting: Pulmonary Disease

## 2017-09-04 ENCOUNTER — Ambulatory Visit (HOSPITAL_COMMUNITY)
Admission: RE | Admit: 2017-09-04 | Discharge: 2017-09-04 | Disposition: A | Discharge status: Home / Self Care | Payer: Medicare Other | Source: Ambulatory Visit | Attending: Pulmonary Disease | Admitting: Pulmonary Disease

## 2017-09-04 DIAGNOSIS — R1319 Other dysphagia: Secondary | ICD-10-CM

## 2017-09-12 DIAGNOSIS — G43909 Migraine, unspecified, not intractable, without status migrainosus: Secondary | ICD-10-CM | POA: Diagnosis not present

## 2017-09-12 DIAGNOSIS — M545 Low back pain: Secondary | ICD-10-CM | POA: Diagnosis not present

## 2017-10-16 DIAGNOSIS — M5417 Radiculopathy, lumbosacral region: Secondary | ICD-10-CM | POA: Diagnosis not present

## 2017-10-16 DIAGNOSIS — G894 Chronic pain syndrome: Secondary | ICD-10-CM | POA: Diagnosis not present

## 2017-10-16 DIAGNOSIS — M5116 Intervertebral disc disorders with radiculopathy, lumbar region: Secondary | ICD-10-CM | POA: Diagnosis not present

## 2017-10-16 DIAGNOSIS — Z96653 Presence of artificial knee joint, bilateral: Secondary | ICD-10-CM | POA: Diagnosis not present

## 2017-10-16 DIAGNOSIS — Z885 Allergy status to narcotic agent status: Secondary | ICD-10-CM | POA: Diagnosis not present

## 2017-10-16 DIAGNOSIS — Z91030 Bee allergy status: Secondary | ICD-10-CM | POA: Diagnosis not present

## 2017-10-16 DIAGNOSIS — Z880 Allergy status to penicillin: Secondary | ICD-10-CM | POA: Diagnosis not present

## 2017-10-16 DIAGNOSIS — F329 Major depressive disorder, single episode, unspecified: Secondary | ICD-10-CM | POA: Diagnosis not present

## 2017-10-16 DIAGNOSIS — Z888 Allergy status to other drugs, medicaments and biological substances status: Secondary | ICD-10-CM | POA: Diagnosis not present

## 2017-10-16 DIAGNOSIS — M48061 Spinal stenosis, lumbar region without neurogenic claudication: Secondary | ICD-10-CM | POA: Diagnosis not present

## 2017-10-16 DIAGNOSIS — F419 Anxiety disorder, unspecified: Secondary | ICD-10-CM | POA: Diagnosis not present

## 2017-10-17 ENCOUNTER — Telehealth: Payer: Self-pay | Admitting: Neurology

## 2017-10-17 ENCOUNTER — Other Ambulatory Visit (HOSPITAL_COMMUNITY): Payer: Self-pay | Admitting: General Surgery

## 2017-10-17 DIAGNOSIS — E538 Deficiency of other specified B group vitamins: Secondary | ICD-10-CM

## 2017-10-17 NOTE — Telephone Encounter (Signed)
She has a Botox appt for her migraines in a thirty minute slot.  We can discuss memory at this appt.  Returned call to patient.  No answer or machine at her home number. Left message on her cell phone letting her know her concerns could be addressed at the appt on 11/12/17.

## 2017-10-17 NOTE — Telephone Encounter (Signed)
Patient called to schedule botox injection and stated she was having some difficulty with memory issues. She would like to discuss this with Clifton Custard. She wants to know if she should come in to see Dr. [name] regarding these problems. Please call and advise.

## 2017-10-22 ENCOUNTER — Telehealth: Payer: Self-pay

## 2017-10-22 NOTE — Telephone Encounter (Signed)
Called pt and left VM for her to contact Dr. Excell Seltzer office regarding her referral that he placed. Also provided the number to the clinic the referral was sent to for diabetes management.  Cyndia Bent RN

## 2017-10-28 DIAGNOSIS — R05 Cough: Secondary | ICD-10-CM | POA: Diagnosis not present

## 2017-10-30 ENCOUNTER — Encounter (HOSPITAL_COMMUNITY): Payer: Self-pay

## 2017-10-30 ENCOUNTER — Ambulatory Visit (HOSPITAL_COMMUNITY): Payer: Medicare Other

## 2017-10-30 ENCOUNTER — Ambulatory Visit (HOSPITAL_COMMUNITY)
Admission: RE | Admit: 2017-10-30 | Discharge: 2017-10-30 | Disposition: A | Discharge status: Home / Self Care | Payer: Medicare Other | Source: Ambulatory Visit | Attending: General Surgery | Admitting: General Surgery

## 2017-11-07 ENCOUNTER — Telehealth: Payer: Self-pay | Admitting: *Deleted

## 2017-11-07 NOTE — Telephone Encounter (Signed)
Received notice from her insurance plan that cyclobenzaprine is non-formulary.  Tizanidine is preferred.  Dr. [name] offered to change her medication.  However, I called the patient and she would like try to stop using muscle relaxers.  She feels taking it at bedtime is not really changing the way she sleeps.  She does not want to start any other medications at this time.  She will call back if she changes her mind.

## 2017-11-12 ENCOUNTER — Ambulatory Visit: Payer: Medicare Other | Admitting: Neurology

## 2017-11-12 ENCOUNTER — Telehealth: Payer: Self-pay | Admitting: Neurology

## 2017-11-12 NOTE — Telephone Encounter (Signed)
Pt called her husband is having OT, the RN is coming today for the 1st time and she feels she needs to be with him and is wanting to r/s the botox appt.([facility]rum is coming also) She wanted RN to know why she did not come today.  Danielle, she can be reached tomorrow to r./s this appt. Pt is aware of the no show policy  Thank you

## 2017-11-13 ENCOUNTER — Ambulatory Visit: Payer: Self-pay | Admitting: Registered"

## 2017-11-14 NOTE — Telephone Encounter (Signed)
I called the patient to rescheduled and spoke with her husband, he told me he would have her call me back.

## 2017-11-17 ENCOUNTER — Inpatient Hospital Stay (HOSPITAL_COMMUNITY)
Admission: EM | Admit: 2017-11-17 | Discharge: 2017-12-02 | DRG: 439 | Disposition: A | Discharge status: Home / Self Care | Payer: Medicare Other | Attending: Internal Medicine | Admitting: Internal Medicine

## 2017-11-17 ENCOUNTER — Emergency Department (HOSPITAL_COMMUNITY): Payer: Medicare Other

## 2017-11-17 ENCOUNTER — Encounter (HOSPITAL_COMMUNITY): Payer: Self-pay | Admitting: *Deleted

## 2017-11-17 DIAGNOSIS — F332 Major depressive disorder, recurrent severe without psychotic features: Secondary | ICD-10-CM | POA: Diagnosis not present

## 2017-11-17 DIAGNOSIS — N390 Urinary tract infection, site not specified: Secondary | ICD-10-CM | POA: Diagnosis not present

## 2017-11-17 DIAGNOSIS — G43909 Migraine, unspecified, not intractable, without status migrainosus: Secondary | ICD-10-CM | POA: Diagnosis present

## 2017-11-17 DIAGNOSIS — R1110 Vomiting, unspecified: Secondary | ICD-10-CM | POA: Diagnosis not present

## 2017-11-17 DIAGNOSIS — B952 Enterococcus as the cause of diseases classified elsewhere: Secondary | ICD-10-CM | POA: Diagnosis not present

## 2017-11-17 DIAGNOSIS — Z86711 Personal history of pulmonary embolism: Secondary | ICD-10-CM | POA: Diagnosis not present

## 2017-11-17 DIAGNOSIS — F419 Anxiety disorder, unspecified: Secondary | ICD-10-CM | POA: Diagnosis not present

## 2017-11-17 DIAGNOSIS — R079 Chest pain, unspecified: Secondary | ICD-10-CM | POA: Diagnosis not present

## 2017-11-17 DIAGNOSIS — Z91030 Bee allergy status: Secondary | ICD-10-CM

## 2017-11-17 DIAGNOSIS — Z87891 Personal history of nicotine dependence: Secondary | ICD-10-CM | POA: Diagnosis not present

## 2017-11-17 DIAGNOSIS — T50902A Poisoning by unspecified drugs, medicaments and biological substances, intentional self-harm, initial encounter: Secondary | ICD-10-CM | POA: Diagnosis not present

## 2017-11-17 DIAGNOSIS — Z598 Other problems related to housing and economic circumstances: Secondary | ICD-10-CM | POA: Diagnosis not present

## 2017-11-17 DIAGNOSIS — G4700 Insomnia, unspecified: Secondary | ICD-10-CM | POA: Diagnosis not present

## 2017-11-17 DIAGNOSIS — I10 Essential (primary) hypertension: Secondary | ICD-10-CM | POA: Diagnosis present

## 2017-11-17 DIAGNOSIS — E039 Hypothyroidism, unspecified: Secondary | ICD-10-CM | POA: Diagnosis present

## 2017-11-17 DIAGNOSIS — Z96651 Presence of right artificial knee joint: Secondary | ICD-10-CM | POA: Diagnosis present

## 2017-11-17 DIAGNOSIS — I2510 Atherosclerotic heart disease of native coronary artery without angina pectoris: Secondary | ICD-10-CM | POA: Diagnosis present

## 2017-11-17 DIAGNOSIS — J441 Chronic obstructive pulmonary disease with (acute) exacerbation: Secondary | ICD-10-CM | POA: Diagnosis not present

## 2017-11-17 DIAGNOSIS — E669 Obesity, unspecified: Secondary | ICD-10-CM | POA: Diagnosis not present

## 2017-11-17 DIAGNOSIS — R0902 Hypoxemia: Secondary | ICD-10-CM | POA: Diagnosis not present

## 2017-11-17 DIAGNOSIS — G8929 Other chronic pain: Secondary | ICD-10-CM | POA: Diagnosis present

## 2017-11-17 DIAGNOSIS — R1314 Dysphagia, pharyngoesophageal phase: Secondary | ICD-10-CM | POA: Diagnosis present

## 2017-11-17 DIAGNOSIS — J9811 Atelectasis: Secondary | ICD-10-CM | POA: Diagnosis present

## 2017-11-17 DIAGNOSIS — K859 Acute pancreatitis without necrosis or infection, unspecified: Principal | ICD-10-CM | POA: Diagnosis present

## 2017-11-17 DIAGNOSIS — R202 Paresthesia of skin: Secondary | ICD-10-CM | POA: Diagnosis present

## 2017-11-17 DIAGNOSIS — T1491XA Suicide attempt, initial encounter: Secondary | ICD-10-CM | POA: Diagnosis not present

## 2017-11-17 DIAGNOSIS — G250 Essential tremor: Secondary | ICD-10-CM | POA: Diagnosis present

## 2017-11-17 DIAGNOSIS — Z86718 Personal history of other venous thrombosis and embolism: Secondary | ICD-10-CM | POA: Diagnosis not present

## 2017-11-17 DIAGNOSIS — K5900 Constipation, unspecified: Secondary | ICD-10-CM | POA: Diagnosis present

## 2017-11-17 DIAGNOSIS — F322 Major depressive disorder, single episode, severe without psychotic features: Secondary | ICD-10-CM

## 2017-11-17 DIAGNOSIS — R109 Unspecified abdominal pain: Secondary | ICD-10-CM | POA: Diagnosis not present

## 2017-11-17 DIAGNOSIS — R1084 Generalized abdominal pain: Secondary | ICD-10-CM | POA: Diagnosis not present

## 2017-11-17 DIAGNOSIS — R000 Tachycardia, unspecified: Secondary | ICD-10-CM | POA: Diagnosis not present

## 2017-11-17 DIAGNOSIS — Z888 Allergy status to other drugs, medicaments and biological substances status: Secondary | ICD-10-CM

## 2017-11-17 DIAGNOSIS — Z6379 Other stressful life events affecting family and household: Secondary | ICD-10-CM | POA: Diagnosis not present

## 2017-11-17 DIAGNOSIS — K858 Other acute pancreatitis without necrosis or infection: Secondary | ICD-10-CM | POA: Diagnosis not present

## 2017-11-17 DIAGNOSIS — E876 Hypokalemia: Secondary | ICD-10-CM | POA: Diagnosis not present

## 2017-11-17 DIAGNOSIS — E1165 Type 2 diabetes mellitus with hyperglycemia: Secondary | ICD-10-CM | POA: Diagnosis not present

## 2017-11-17 DIAGNOSIS — E119 Type 2 diabetes mellitus without complications: Secondary | ICD-10-CM | POA: Diagnosis not present

## 2017-11-17 DIAGNOSIS — R0602 Shortness of breath: Secondary | ICD-10-CM | POA: Diagnosis not present

## 2017-11-17 DIAGNOSIS — Z7901 Long term (current) use of anticoagulants: Secondary | ICD-10-CM | POA: Diagnosis not present

## 2017-11-17 DIAGNOSIS — R059 Cough, unspecified: Secondary | ICD-10-CM

## 2017-11-17 DIAGNOSIS — K219 Gastro-esophageal reflux disease without esophagitis: Secondary | ICD-10-CM | POA: Diagnosis present

## 2017-11-17 DIAGNOSIS — T402X2A Poisoning by other opioids, intentional self-harm, initial encounter: Secondary | ICD-10-CM | POA: Diagnosis present

## 2017-11-17 DIAGNOSIS — E1142 Type 2 diabetes mellitus with diabetic polyneuropathy: Secondary | ICD-10-CM | POA: Diagnosis present

## 2017-11-17 DIAGNOSIS — I959 Hypotension, unspecified: Secondary | ICD-10-CM | POA: Diagnosis present

## 2017-11-17 DIAGNOSIS — R195 Other fecal abnormalities: Secondary | ICD-10-CM | POA: Diagnosis not present

## 2017-11-17 DIAGNOSIS — Z6837 Body mass index (BMI) 37.0-37.9, adult: Secondary | ICD-10-CM | POA: Diagnosis not present

## 2017-11-17 DIAGNOSIS — K760 Fatty (change of) liver, not elsewhere classified: Secondary | ICD-10-CM | POA: Diagnosis present

## 2017-11-17 DIAGNOSIS — T391X2A Poisoning by 4-Aminophenol derivatives, intentional self-harm, initial encounter: Secondary | ICD-10-CM | POA: Diagnosis not present

## 2017-11-17 DIAGNOSIS — Z880 Allergy status to penicillin: Secondary | ICD-10-CM

## 2017-11-17 DIAGNOSIS — R05 Cough: Secondary | ICD-10-CM

## 2017-11-17 DIAGNOSIS — G4730 Sleep apnea, unspecified: Secondary | ICD-10-CM | POA: Diagnosis present

## 2017-11-17 DIAGNOSIS — R4182 Altered mental status, unspecified: Secondary | ICD-10-CM | POA: Diagnosis not present

## 2017-11-17 DIAGNOSIS — Z7984 Long term (current) use of oral hypoglycemic drugs: Secondary | ICD-10-CM

## 2017-11-17 DIAGNOSIS — R45851 Suicidal ideations: Secondary | ICD-10-CM | POA: Diagnosis not present

## 2017-11-17 DIAGNOSIS — Z8249 Family history of ischemic heart disease and other diseases of the circulatory system: Secondary | ICD-10-CM

## 2017-11-17 DIAGNOSIS — K5730 Diverticulosis of large intestine without perforation or abscess without bleeding: Secondary | ICD-10-CM | POA: Diagnosis not present

## 2017-11-17 DIAGNOSIS — Z811 Family history of alcohol abuse and dependence: Secondary | ICD-10-CM | POA: Diagnosis not present

## 2017-11-17 DIAGNOSIS — Z818 Family history of other mental and behavioral disorders: Secondary | ICD-10-CM | POA: Diagnosis not present

## 2017-11-17 DIAGNOSIS — F329 Major depressive disorder, single episode, unspecified: Secondary | ICD-10-CM | POA: Diagnosis not present

## 2017-11-17 DIAGNOSIS — R450 Nervousness: Secondary | ICD-10-CM | POA: Diagnosis not present

## 2017-11-17 LAB — URINALYSIS, ROUTINE W REFLEX MICROSCOPIC
Bilirubin Urine: NEGATIVE
Glucose, UA: NEGATIVE mg/dL
Hgb urine dipstick: NEGATIVE
Ketones, ur: NEGATIVE mg/dL
Leukocytes, UA: NEGATIVE
Nitrite: NEGATIVE
Protein, ur: NEGATIVE mg/dL
Specific Gravity, Urine: 1.003 — ABNORMAL LOW (ref 1.005–1.030)
pH: 7 (ref 5.0–8.0)

## 2017-11-17 LAB — COMPREHENSIVE METABOLIC PANEL
ALT: 18 U/L (ref 14–54)
AST: 28 U/L (ref 15–41)
Albumin: 3.6 g/dL (ref 3.5–5.0)
Alkaline Phosphatase: 61 U/L (ref 38–126)
Anion gap: 9 (ref 5–15)
BUN: 7 mg/dL (ref 6–20)
CO2: 29 mmol/L (ref 22–32)
Calcium: 8.8 mg/dL — ABNORMAL LOW (ref 8.9–10.3)
Chloride: 99 mmol/L — ABNORMAL LOW (ref 101–111)
Creatinine, Ser: 0.67 mg/dL (ref 0.44–1.00)
GFR calc Af Amer: >60 mL/min (ref >60)
GFR calc non Af Amer: >60 mL/min (ref >60)
Glucose, Bld: 207 mg/dL — ABNORMAL HIGH (ref 65–99)
Potassium: 3.3 mmol/L — ABNORMAL LOW (ref 3.5–5.1)
Sodium: 137 mmol/L (ref 135–145)
Total Bilirubin: 0.7 mg/dL (ref 0.3–1.2)
Total Protein: 7.1 g/dL (ref 6.5–8.1)

## 2017-11-17 LAB — I-STAT CG4 LACTIC ACID, ED: Lactic Acid, Venous: 1.56 mmol/L (ref 0.5–1.9)

## 2017-11-17 LAB — I-STAT TROPONIN, ED: Troponin i, poc: 0 ng/mL (ref 0.00–0.08)

## 2017-11-17 LAB — BRAIN NATRIURETIC PEPTIDE: B Natriuretic Peptide: 64.1 pg/mL (ref 0.0–100.0)

## 2017-11-17 LAB — RAPID URINE DRUG SCREEN, HOSP PERFORMED
Amphetamines: NOT DETECTED
Barbiturates: NOT DETECTED
Benzodiazepines: NOT DETECTED
Cocaine: NOT DETECTED
Opiates: NOT DETECTED
Tetrahydrocannabinol: NOT DETECTED

## 2017-11-17 LAB — CBC
HCT: 45.6 % (ref 36.0–46.0)
Hemoglobin: 14.9 g/dL (ref 12.0–15.0)
MCH: 30.3 pg (ref 26.0–34.0)
MCHC: 32.7 g/dL (ref 30.0–36.0)
MCV: 92.7 fL (ref 78.0–100.0)
Platelets: 247 10*3/uL (ref 150–400)
RBC: 4.92 MIL/uL (ref 3.87–5.11)
RDW: 13.8 % (ref 11.5–15.5)
WBC: 10 10*3/uL (ref 4.0–10.5)

## 2017-11-17 LAB — ACETAMINOPHEN LEVEL: Acetaminophen (Tylenol), Serum: <10 ug/mL — ABNORMAL LOW (ref 10–30)

## 2017-11-17 LAB — TRIGLYCERIDES: Triglycerides: 122 mg/dL (ref <150)

## 2017-11-17 LAB — PROTIME-INR
INR: 1.05
Prothrombin Time: 13.6 seconds (ref 11.4–15.2)

## 2017-11-17 LAB — CBG MONITORING, ED: Glucose-Capillary: 195 mg/dL — ABNORMAL HIGH (ref 65–99)

## 2017-11-17 LAB — TSH: TSH: 1.348 u[IU]/mL (ref 0.350–4.500)

## 2017-11-17 LAB — MAGNESIUM: Magnesium: 1.7 mg/dL (ref 1.7–2.4)

## 2017-11-17 LAB — INFLUENZA PANEL BY PCR (TYPE A & B)
Influenza A By PCR: NEGATIVE
Influenza B By PCR: NEGATIVE

## 2017-11-17 LAB — SALICYLATE LEVEL: Salicylate Lvl: <7 mg/dL (ref 2.8–30.0)

## 2017-11-17 LAB — LIPASE, BLOOD: Lipase: 82 U/L — ABNORMAL HIGH (ref 11–51)

## 2017-11-17 LAB — PHOSPHORUS: Phosphorus: 2.3 mg/dL — ABNORMAL LOW (ref 2.5–4.6)

## 2017-11-17 MED ORDER — ONDANSETRON HCL 4 MG/2ML IJ SOLN
4.0000 mg | Freq: Once | INTRAMUSCULAR | Status: AC
Start: 2017-11-17 — End: 2017-11-17
  Administered 2017-11-17: 4 mg via INTRAVENOUS
  Filled 2017-11-17: qty 2

## 2017-11-17 MED ORDER — ONDANSETRON HCL 4 MG/2ML IJ SOLN
4.0000 mg | Freq: Four times a day (QID) | INTRAMUSCULAR | Status: DC | PRN
  Administered 2017-11-18 – 2017-11-21 (×5): 4 mg via INTRAVENOUS
  Filled 2017-11-17 (×5): qty 2

## 2017-11-17 MED ORDER — SODIUM CHLORIDE 0.9 % IV BOLUS (SEPSIS)
1000.0000 mL | Freq: Once | INTRAVENOUS | Status: AC
  Administered 2017-11-17: 1000 mL via INTRAVENOUS

## 2017-11-17 MED ORDER — DOCUSATE SODIUM 100 MG PO CAPS
100.0000 mg | ORAL_CAPSULE | Freq: Two times a day (BID) | ORAL | Status: DC
  Administered 2017-11-18 – 2017-11-22 (×7): 100 mg via ORAL
  Filled 2017-11-17 (×10): qty 1

## 2017-11-17 MED ORDER — ACETAMINOPHEN 650 MG RE SUPP: 650.0000 mg | Freq: Four times a day (QID) | RECTAL | Status: DC | PRN

## 2017-11-17 MED ORDER — MOMETASONE FURO-FORMOTEROL FUM 100-5 MCG/ACT IN AERO
2.0000 | INHALATION_SPRAY | Freq: Two times a day (BID) | RESPIRATORY_TRACT | Status: DC
  Administered 2017-11-19 – 2017-11-26 (×15): 2 via RESPIRATORY_TRACT
  Filled 2017-11-17 (×2): qty 8.8

## 2017-11-17 MED ORDER — IOPAMIDOL (ISOVUE-370) INJECTION 76%
INTRAVENOUS | Status: AC
  Filled 2017-11-17: qty 100

## 2017-11-17 MED ORDER — POTASSIUM CHLORIDE 10 MEQ/100ML IV SOLN
10.0000 meq | INTRAVENOUS | Status: AC
  Filled 2017-11-17 (×2): qty 100

## 2017-11-17 MED ORDER — ONDANSETRON HCL 4 MG PO TABS
4.0000 mg | ORAL_TABLET | Freq: Four times a day (QID) | ORAL | Status: DC | PRN
  Administered 2017-11-23 – 2017-11-28 (×9): 4 mg via ORAL
  Filled 2017-11-17 (×10): qty 1

## 2017-11-17 MED ORDER — IOPAMIDOL (ISOVUE-370) INJECTION 76%
100.0000 mL | Freq: Once | INTRAVENOUS | Status: AC | PRN
  Administered 2017-11-17: 60 mL via INTRAVENOUS

## 2017-11-17 MED ORDER — SENNA 8.6 MG PO TABS
1.0000 | ORAL_TABLET | Freq: Two times a day (BID) | ORAL | Status: DC
  Administered 2017-11-18 – 2017-11-21 (×5): 8.6 mg via ORAL
  Filled 2017-11-17 (×9): qty 1

## 2017-11-17 MED ORDER — IOPAMIDOL (ISOVUE-300) INJECTION 61%
100.0000 mL | Freq: Once | INTRAVENOUS | Status: AC | PRN
  Administered 2017-11-17: 100 mL via INTRAVENOUS

## 2017-11-17 MED ORDER — INSULIN GLARGINE 100 UNIT/ML ~~LOC~~ SOLN
10.0000 [IU] | Freq: Every day | SUBCUTANEOUS | Status: DC
  Administered 2017-11-18 – 2017-12-01 (×14): 10 [IU] via SUBCUTANEOUS
  Filled 2017-11-17 (×18): qty 0.1

## 2017-11-17 MED ORDER — ACETAMINOPHEN 325 MG PO TABS
650.0000 mg | ORAL_TABLET | Freq: Four times a day (QID) | ORAL | Status: DC | PRN
  Administered 2017-11-19 – 2017-11-27 (×6): 650 mg via ORAL
  Filled 2017-11-17 (×6): qty 2

## 2017-11-17 MED ORDER — IOPAMIDOL (ISOVUE-300) INJECTION 61%
INTRAVENOUS | Status: AC
  Filled 2017-11-17: qty 100

## 2017-11-17 MED ORDER — POLYETHYLENE GLYCOL 3350 17 G PO PACK
17.0000 g | PACK | Freq: Every day | ORAL | Status: DC | PRN
  Administered 2017-11-22: 17 g via ORAL
  Filled 2017-11-17: qty 1

## 2017-11-17 MED ORDER — LEVOTHYROXINE SODIUM 25 MCG PO TABS
25.0000 ug | ORAL_TABLET | Freq: Every day | ORAL | Status: DC
  Administered 2017-11-18 – 2017-12-02 (×15): 25 ug via ORAL
  Filled 2017-11-17 (×15): qty 1

## 2017-11-17 MED ORDER — APIXABAN 2.5 MG PO TABS
2.5000 mg | ORAL_TABLET | Freq: Two times a day (BID) | ORAL | Status: DC
  Administered 2017-11-18 – 2017-12-02 (×30): 2.5 mg via ORAL
  Filled 2017-11-17 (×30): qty 1

## 2017-11-17 MED ORDER — KETOROLAC TROMETHAMINE 30 MG/ML IJ SOLN
30.0000 mg | Freq: Once | INTRAMUSCULAR | Status: AC
  Administered 2017-11-17: 30 mg via INTRAVENOUS
  Filled 2017-11-17: qty 1

## 2017-11-17 MED ORDER — LACTATED RINGERS IV SOLN
INTRAVENOUS | Status: DC
  Administered 2017-11-18 (×2): via INTRAVENOUS
  Administered 2017-11-18: 1000 mL via INTRAVENOUS
  Administered 2017-11-18: 14:00:00 via INTRAVENOUS
  Administered 2017-11-19: 1000 mL via INTRAVENOUS
  Administered 2017-11-19 (×3): via INTRAVENOUS
  Administered 2017-11-21 – 2017-11-22 (×3): 150 mL/h via INTRAVENOUS

## 2017-11-17 MED ORDER — INSULIN ASPART 100 UNIT/ML ~~LOC~~ SOLN
0.0000 [IU] | SUBCUTANEOUS | Status: DC
  Administered 2017-11-18 (×2): 3 [IU] via SUBCUTANEOUS
  Administered 2017-11-18: 2 [IU] via SUBCUTANEOUS
  Administered 2017-11-18 – 2017-11-19 (×2): 3 [IU] via SUBCUTANEOUS
  Administered 2017-11-19: 5 [IU] via SUBCUTANEOUS
  Administered 2017-11-19: 2 [IU] via SUBCUTANEOUS
  Administered 2017-11-19: 3 [IU] via SUBCUTANEOUS
  Administered 2017-11-19: 2 [IU] via SUBCUTANEOUS
  Administered 2017-11-19 – 2017-11-20 (×5): 3 [IU] via SUBCUTANEOUS
  Administered 2017-11-21: 5 [IU] via SUBCUTANEOUS
  Administered 2017-11-21: 8 [IU] via SUBCUTANEOUS
  Administered 2017-11-21 (×2): 2 [IU] via SUBCUTANEOUS
  Administered 2017-11-22: 5 [IU] via SUBCUTANEOUS
  Administered 2017-11-22: 2 [IU] via SUBCUTANEOUS
  Administered 2017-11-22 (×2): 5 [IU] via SUBCUTANEOUS
  Administered 2017-11-22: 8 [IU] via SUBCUTANEOUS
  Administered 2017-11-23 (×2): 2 [IU] via SUBCUTANEOUS
  Administered 2017-11-23: 5 [IU] via SUBCUTANEOUS
  Administered 2017-11-23: 3 [IU] via SUBCUTANEOUS
  Administered 2017-11-23: 11 [IU] via SUBCUTANEOUS

## 2017-11-17 MED ORDER — OXYCODONE HCL 5 MG PO TABS
5.0000 mg | ORAL_TABLET | ORAL | Status: DC | PRN
  Administered 2017-11-18 – 2017-12-01 (×10): 5 mg via ORAL
  Filled 2017-11-17 (×10): qty 1

## 2017-11-17 MED ORDER — ALBUTEROL SULFATE (2.5 MG/3ML) 0.083% IN NEBU
2.5000 mg | INHALATION_SOLUTION | Freq: Four times a day (QID) | RESPIRATORY_TRACT | Status: DC | PRN
  Administered 2017-11-21 – 2017-12-01 (×5): 2.5 mg via RESPIRATORY_TRACT
  Filled 2017-11-17 (×5): qty 3

## 2017-11-17 MED ORDER — GABAPENTIN 300 MG PO CAPS
600.0000 mg | ORAL_CAPSULE | Freq: Three times a day (TID) | ORAL | Status: DC
  Administered 2017-11-18 – 2017-11-23 (×13): 600 mg via ORAL
  Filled 2017-11-17 (×31): qty 2

## 2017-11-17 MED ORDER — PROPRANOLOL HCL 20 MG PO TABS
60.0000 mg | ORAL_TABLET | Freq: Two times a day (BID) | ORAL | Status: DC
  Administered 2017-11-18 (×2): 60 mg via ORAL
  Filled 2017-11-17 (×4): qty 3

## 2017-11-17 MED ORDER — BUPROPION HCL ER (XL) 300 MG PO TB24
300.0000 mg | ORAL_TABLET | Freq: Every day | ORAL | Status: DC
  Administered 2017-11-18 – 2017-12-02 (×15): 300 mg via ORAL
  Filled 2017-11-17 (×15): qty 1

## 2017-11-17 MED ORDER — SODIUM CHLORIDE 0.9 % IJ SOLN
INTRAMUSCULAR | Status: AC
  Administered 2017-11-17: 18:00:00
  Filled 2017-11-17: qty 50

## 2017-11-17 NOTE — ED Provider Notes (Signed)
  Physical Exam  BP 105/71   Pulse (!) 104   Temp 99.8 F (37.7 C) (Oral)   Resp 16   LMP 09/18/1979 (Approximate)   SpO2 94%   Physical Exam  ED Course/Procedures     Procedures  MDM  Care assumed at sign out. Patient hx of COPD but uncompliant with meds or oxygen here with SOB, hallucination, abdominal pain. Patient was noted to be hypoxic 89% on RA and required 4 L [place]. Lipase slightly elevated at 85. Sign out pending CTA chest, CT ab/pel and CT head.   6:47 PM CTA chest showed no pneumonia or PE. CT ab/pel showed pancreatitis. Given that she had new oxygen requirement, will admit for COPD exacerbation, pancreatitis.      Drenda Freeze, MD 11/17/17 [phone]

## 2017-11-17 NOTE — ED Provider Notes (Signed)
Emergency Department Provider Note   I have reviewed the triage vital signs and the nursing notes.   HISTORY  Chief Complaint Suicidal; Emesis; and Altered Mental Status   HPI Hayley [name] is a 72 y.o. female with PMH of DM, chronic nausea and abdominal pain, chronic migraine type HA, and goiter thence to the emergency department for evaluation of multiple complaints including worsening nausea and abdominal pain over the last 1-1/2 years, migraine headache, and new onset altered mental status with suicidal ideation.  Family state that over the last several days she seemed more confused and has been experiencing increased stress.  The patient verbalizes stress over her husband's health and their family's financial situation.  Patient states she has been experiencing increasing hopelessness and sometime within the last week she took a bottle of oxycodone trying to "slip into a deep sleep and just not wake up."  Patient told the family about this today and patient confirms continuing to feel this way.  She denies ingesting any medications today in a suicide attempt.  Patient has had significant nausea and muscle cramping worse over the last several months.  She is tried over-the-counter Phenergan with no relief.  She denies any choking while vomiting, difficulty breathing, fever, chills. Family has noted with increased SOB but denies cough. No diarrhea. No sick contacts.   Family has also noted some apparent hallucinations.  Over the last week she has been describing bedbugs which no one in the family can find.  She has been collecting these "bugs" which family describes as pieces of kitty litter.    Past Medical History:  Diagnosis Date  . Anemia   . Anxiety   . Bronchitis    hx of   . Clotting disorder ([place]) 02-17-14   S/p appendectomy-developed Pulmonary emboli-tx. warfarin-d/c 1 yr ago.  . Colon polyps   . Depression   . Diabetes mellitus without complication ([place])    "borderline"-  oral med  . Disorder of vocal cords    spasmotic dysphonia ,02-17-14 has" whispery voice-low tone"  . Hyperthyroidism   . Kidney stones   . Migraines   . Nausea Nov 15, 2014   cycle of migraine headaches and nausea  . Neuromuscular disorder ([place])    hands and throat"spasmodic dysphonia", tremors - thimb and forefinger   . Obesity, Class III, BMI 40-49.9 (morbid obesity) ([place]) 04/27/2010   02-17-14 reports some weight loss- intentional  . Osteoarthritis    in back(chronic pain)  . Pelvic pain    02-17-14 "states thinks its back related on left groin"  . Pulmonary emboli [facility])    s/p Appendectomy [facility]  . Reflux   . Right knee pain   . Sleep apnea    cpap - settings at 4   . Toxic goiter     Patient Active Problem List   Diagnosis Date Noted  . Pancreatitis 11/17/2017  . Writer's cramp 01/02/2017  . Neck pain on left side 09/26/2016  . Tremor 06/20/2016  . Obesity, Class II, BMI 35-39.9, isolated (see actual BMI) 04/09/2016  . UTI (lower urinary tract infection)   . Paresthesia of both lower extremities 03/22/2016  . Vertigo 03/22/2016  . Essential tremor 03/12/2016  . Fall 03/12/2016  . Low back pain 03/12/2016  . Coagulopathy ([place]) 07/20/2015  . Diabetes mellitus without complication ([place]) 16/06/9603  . Essential hypertension 06/03/2015  . OA (osteoarthritis) of knee 12/06/2014  . Hypothyroidism 03/03/2014  . Asthma 03/03/2014  . Acute blood loss  anemia 03/03/2014  . Left knee pain 03/03/2014  . Failed total knee arthroplasty ([place]) 02/24/2014  . OSA on CPAP 12/23/2013  . History of pulmonary embolus (PE) 12/21/2013  . 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 Obesity, morbid ([place]) 04/27/2010  . ANEMIA 04/27/2010  . ANXIETY DEPRESSION 04/27/2010  . Chronic migraine 04/27/2010  . NEUROPATHY 04/27/2010  . HEMORRHOIDS 04/27/2010  . GERD 04/27/2010  . DIVERTICULOSIS, COLON 04/27/2010  . IBS 04/27/2010  . CHOLELITHIASIS 04/27/2010  . RENAL CALCULUS 04/27/2010    Past  Surgical History:  Procedure Laterality Date  . ABDOMINAL HYSTERECTOMY    . APPENDECTOMY     3'13 [facility] s/p developed  Pulmonary Emboli  . BOTOX INJECTION     for migranes  . CATARACT EXTRACTION, BILATERAL Bilateral 03/2013   . CHOLECYSTECTOMY    . HAND SURGERY  2004   both hands  . KIDNEY STONE SURGERY    . KNEE ARTHROSCOPY  1998   right  . LAPAROSCOPIC APPENDECTOMY  11/27/2011   Procedure: APPENDECTOMY LAPAROSCOPIC;  Surgeon: Adin Hector, MD;  Location: [facility];  Service: General;  Laterality: N/A;  . SPINAL CORD STIMULATOR IMPLANT  06/04/2016   Dr. Alfonse Ras at [facility]  . TOTAL KNEE ARTHROPLASTY Right 12/06/2014   Procedure: RIGHT TOTAL KNEE ARTHROPLASTY;  Surgeon: Gaynelle Arabian, MD;  Location: [facility];  Service: Orthopedics;  Laterality: Right;  . TOTAL KNEE REVISION Left 02/24/2014   Procedure: LEFT TOTAL KNEE REVISION;  Surgeon: Gearlean Alf, MD;  Location: [facility];  Service: Orthopedics;  Laterality: Left;  . vocal cord surgery  02-17-14   01-12-14 done in [place] [facility](s/p selective denervation/reinnervation recurrent laryngeal nerve surgery)  . zenkers diverticulum      Current Outpatient Rx  . Order #: [id number] Class: Historical Med  . Order #: [id number] Class: Historical Med  . Order #: [id number] Class: Phone In  . Order #: [id number] Class: Historical Med  . Order #: [id number] Class: Historical Med  . Order #: [id number] Class: Historical Med  . Order #: [id number] Class: Historical Med  . Order #: [id number] Class: Print  . Order #: [id number] Class: Historical Med  . Order #: [id number] Class: Historical Med  . Order #: [id number] Class: Normal  . Order #: [id number] Class: Historical Med  . Order #: [id number] Class: Historical Med  . Order #: [id number] Class: Historical Med  . Order #: [id number] Class: Normal  . Order #: [id number] Class: Historical Med  . Order #: [id number] Class: Historical Med  . Order #: [id number] Class: Historical Med  . Order #: [id number] Class:  Normal  . Order #: [id number] Class: Historical Med  . Order #: [id number] Class: Normal  . Order #: [id number] Class: Normal  . Order #: [id number] Class: Historical Med  . Order #: [id number] Class: Historical Med  . Order #: [id number] Class: Normal  . Order #: [id number] Class: Normal  . Order #: [id number] Class: Normal  . Order #: [id number] Class: Normal  . Order #: [id number] Class: Normal  . Order #: [id number] Class: No Print    Allergies Bee venom; Tape; Etodolac; Morphine; Other; Poison ivy extract  [poison ivy extract]; Propoxyphene; Propoxyphene n-acetaminophen; and Penicillins  Family History  Problem Relation Age of Onset  . Heart disease Mother   . Pneumonia Father   . Cirrhosis Father   . Hypertension Father   . Alcoholism Father   . Heart attack Paternal Grandfather   . Heart attack Maternal Grandfather   . Heart attack Paternal Grandmother   . Heart attack Maternal Grandmother     Social History Social History   Tobacco Use  . Smoking status:  Former Smoker    Last attempt to quit: 01/03/1982    Years since quitting: 35.8  . Smokeless tobacco: Never Used  Substance Use Topics  . Alcohol use: No  . Drug use: No    Review of Systems  Constitutional: No fever/chills. Positive AMS. Positive suicidal ideation and attempt earlier this week.  Eyes: No visual changes. ENT: No sore throat. Cardiovascular: Denies chest pain. Respiratory: Denies shortness of breath. Gastrointestinal: Positive cramping abdominal pain. Positive nausea and vomiting.  No diarrhea. No constipation. Genitourinary: Negative for dysuria. Musculoskeletal: Negative for back pain. Skin: Negative for rash. Neurological: Negative for headaches, focal weakness or numbness.  10-point ROS otherwise negative.  ____________________________________________   PHYSICAL EXAM:  VITAL SIGNS: ED Triage Vitals  Enc Vitals Group     BP 11/17/17 1306 126/78     Pulse Rate 11/17/17 1306 (!) 120     Resp  11/17/17 1306 18     Temp 11/17/17 1306 99.8 F (37.7 C)     Temp Source 11/17/17 1306 Oral     SpO2 11/17/17 1306 (!) 89 %     Pain Score 11/17/17 1332 9   Constitutional: Alert and conversational but oriented to self only. No acute distress.  Eyes: Conjunctivae are normal.  Head: Atraumatic. Nose: No congestion/rhinnorhea. Mouth/Throat: Mucous membranes are dry.  Neck: No stridor.  Cardiovascular: Tachycardia. Good peripheral circulation. Grossly normal heart sounds.   Respiratory: Normal respiratory effort.  No retractions. Lungs CTAB. Gastrointestinal: Soft and nontender. No distention.  Musculoskeletal: No lower extremity tenderness nor edema. No gross deformities of extremities. Neurologic:  Normal speech and language. No gross focal neurologic deficits are appreciated.  Skin:  Skin is warm, dry and intact. No rash noted. Psychiatric: Mood and affect are flat. Speech and behavior are normal. Not responding to internal stimuli.   ____________________________________________   LABS (all labs ordered are listed, but only abnormal results are displayed)  Labs Reviewed  COMPREHENSIVE METABOLIC PANEL - Abnormal; Notable for the following components:      Result Value   Potassium 3.3 (*)    Chloride 99 (*)    Glucose, Bld 207 (*)    Calcium 8.8 (*)    All other components within normal limits  LIPASE, BLOOD - Abnormal; Notable for the following components:   Lipase 82 (*)    All other components within normal limits  URINALYSIS, ROUTINE W REFLEX MICROSCOPIC - Abnormal; Notable for the following components:   Specific Gravity, Urine 1.003 (*)    All other components within normal limits  ACETAMINOPHEN LEVEL - Abnormal; Notable for the following components:   Acetaminophen (Tylenol), Serum <10 (*)    All other components within normal limits  CBG MONITORING, ED - Abnormal; Notable for the following components:   Glucose-Capillary 195 (*)    All other components within normal  limits  URINE CULTURE  CBC  PROTIME-INR  RAPID URINE DRUG SCREEN, HOSP PERFORMED  SALICYLATE LEVEL  INFLUENZA PANEL BY PCR (TYPE A & B)  TSH  I-STAT CG4 LACTIC ACID, ED  I-STAT TROPONIN, ED   ____________________________________________  EKG   EKG Interpretation  Date/Time:  Sunday November 17 2017 13:25:35 EST Ventricular Rate:  117 PR Interval:    QRS Duration: 83 QT Interval:  350 QTC Calculation: 489 R Axis:   36 Text Interpretation:  Sinus tachycardia Low voltage, precordial leads Borderline prolonged QT interval No STEMI.  Confirmed by Nanda Quinton [phone]) on 11/17/2017 4:10:22 PM       ____________________________________________  RADIOLOGY  Ct Head Wo Contrast  Result Date: 11/17/2017 CLINICAL DATA:  Patient with nausea and vomiting. Declined mental status. Contrast was given prior to study or abdomen pelvis CT. EXAM: CT HEAD WITHOUT CONTRAST TECHNIQUE: Contiguous axial images were obtained from the base of the skull through the vertex without intravenous contrast. COMPARISON:  Brain CT 03/22/2016. FINDINGS: Brain: The ventricles and sulci are appropriate for patient's age. No evidence for acute cortically based infarct, intracranial hemorrhage, mass lesion or mass-effect. Vascular: Unremarkable. Skull: Intact. Sinuses/Orbits: Paranasal sinuses are well aerated. Mastoid air cells are unremarkable. Orbits are unremarkable. Other: None. IMPRESSION: No acute intracranial process. Electronically Signed   By: Lovey Newcomer M.D.   On: 11/17/2017 18:11   Ct Angio Chest Pe W And/or Wo Contrast  Result Date: 11/17/2017 CLINICAL DATA:  73 year old female with history of shortness of breath and chest pain. EXAM: CT ANGIOGRAPHY CHEST WITH CONTRAST TECHNIQUE: Multidetector CT imaging of the chest was performed using the standard protocol during bolus administration of intravenous contrast. Multiplanar CT image reconstructions and MIPs were obtained to evaluate the vascular anatomy. CONTRAST:   60 mL ISOVUE-370 IOPAMIDOL (ISOVUE-370) INJECTION 76% COMPARISON:  Chest CT 08/02/2017. FINDINGS: Cardiovascular: There are no filling defects within the pulmonary arterial tree to suggest underlying pulmonary embolism. Heart size is normal. There is no significant pericardial fluid, thickening or pericardial calcification. There is aortic atherosclerosis, as well as atherosclerosis of the great vessels of the mediastinum and the coronary arteries, including calcified atherosclerotic plaque in the left main and left anterior descending coronary arteries. Mediastinum/Nodes: No pathologically enlarged mediastinal or hilar lymph nodes. Esophagus is unremarkable in appearance. No axillary lymphadenopathy. Lungs/Pleura: A few scattered areas of subsegmental atelectasis and/or scarring are noted in the lung bases bilaterally. No consolidative airspace disease. No pleural effusions. No suspicious appearing pulmonary nodules or masses are noted. Upper Abdomen: Diffuse low attenuation throughout the visualized portions of the liver, indicative of severe hepatic steatosis. Status post cholecystectomy. Musculoskeletal: Bilateral breast implants incidentally noted with dense capsular calcifications on the right side. There are no aggressive appearing lytic or blastic lesions noted in the visualized portions of the skeleton. Spinal cord stimulator in the midthoracic region. Review of the MIP images confirms the above findings. IMPRESSION: 1. No evidence of pulmonary embolism. 2. No acute findings are noted in the thorax to account for the patient's symptoms. 3. Aortic atherosclerosis, in addition to left main and left anterior descending coronary artery disease. Assessment for potential risk factor modification, dietary therapy or pharmacologic therapy may be warranted, if clinically indicated. 4. Severe hepatic steatosis. Aortic Atherosclerosis (ICD10-I70.0). Electronically Signed   By: Vinnie Langton M.D.   On: 11/17/2017  18:13   Ct Abdomen Pelvis W Contrast  Result Date: 11/17/2017 CLINICAL DATA:  Mid to lower abdominal pain for 2 or 3 weeks. EXAM: CT ABDOMEN AND PELVIS WITH CONTRAST TECHNIQUE: Multidetector CT imaging of the abdomen and pelvis was performed using the standard protocol following bolus administration of intravenous contrast. CONTRAST:  157mL ISOVUE-300 IOPAMIDOL (ISOVUE-300) INJECTION 61% COMPARISON:  December 09, 2014 FINDINGS: Lower chest: No acute abnormality. Hepatobiliary: Hepatic steatosis. Previous cholecystectomy. The portal vein is patent. The liver is otherwise normal. Pancreas: There is fat stranding near the head of the pancreas. The remainder of the pancreas is normal. Spleen: Normal in size without focal abnormality. Adrenals/Urinary Tract: Adrenal glands are unremarkable. Kidneys are normal, without renal calculi, focal lesion, or hydronephrosis. Bladder is unremarkable. Stomach/Bowel: The stomach is normal. There is inflammation of the  second portion of the duodenum. There are duodenal diverticuli in this region. The inflammation is thought to be secondary to pancreatitis rather than from the duodenum given an elevated lipase. The small bowel is otherwise normal. Colonic diverticulosis without diverticulitis. The appendix is not visualized. Vascular/Lymphatic: Mild atherosclerotic change in the abdominal aorta. No aneurysm or dissection. Reproductive: Status post hysterectomy. No adnexal masses. Other: No abdominal wall hernia or abnormality. No abdominopelvic ascites. Musculoskeletal: No acute or significant osseous findings. IMPRESSION: 1. Fat stranding adjacent to the pancreas and adjacent duodenum/duodenal diverticuli is thought to be secondary to pancreatitis given the patient's elevated lipase. 2. Colonic diverticulosis without diverticulitis. 3. Atherosclerotic change in the abdominal aorta. Electronically Signed   By: Dorise Bullion III M.D   On: 11/17/2017 17:48   Dg Abdomen Acute  W/chest  Result Date: 11/17/2017 CLINICAL DATA:  Vomiting, shortness of breath. EXAM: DG ABDOMEN ACUTE W/ 1V CHEST COMPARISON:  None. FINDINGS: Dilated small bowel loops are noted in left upper quadrant. No colonic dilatation is noted. No radiopaque calculi or other significant radiographic abnormality is seen. Heart size and mediastinal contours are within normal limits. Both lungs are clear. IMPRESSION: Dilated small bowel loops seen in left upper quadrant of abdomen concerning for distal small bowel obstruction or focal ileus; follow-up radiographs are recommended. No acute cardiopulmonary disease. Electronically Signed   By: Marijo Conception, M.D.   On: 11/17/2017 15:41    ____________________________________________   PROCEDURES  Procedure(s) performed:   .Critical Care Performed by: Margette Fast, MD Authorized by: Margette Fast, MD   Critical care provider statement:    Critical care time (minutes):  32   Critical care time was exclusive of:  Separately billable procedures and treating other patients and teaching time   Critical care was necessary to treat or prevent imminent or life-threatening deterioration of the following conditions:  Respiratory failure and dehydration   Critical care was time spent personally by me on the following activities:  Development of treatment plan with patient or surrogate, blood draw for specimens, discussions with primary provider, evaluation of patient's response to treatment, examination of patient, obtaining history from patient or surrogate, ordering and performing treatments and interventions, ordering and review of laboratory studies, ordering and review of radiographic studies, pulse oximetry, re-evaluation of patient's condition and review of old charts   I assumed direction of critical care for this patient from another provider in my specialty: no       ____________________________________________   INITIAL IMPRESSION / ASSESSMENT AND  PLAN / ED COURSE  Pertinent labs & imaging results that were available during my care of the patient were reviewed by me and considered in my medical decision making (see chart for details).  Patient presents to the emergency department with nausea and vomiting along with abdominal pain and headache which are mostly chronic symptoms.  I am concerned about her apparent worsening mental status with some confusion and hallucinations.  She also readily endorses to me suicidal ideation and states that several days ago she took a large quantity of oxycodone in a suicide attempt.  This did contain Tylenol.  I have added Tylenol level along with labs, IV fluids.  Patient does follow with pulmonology but she is not on home oxygen.  She is currently on 5-6 L nasal cannula here with initial oxygen saturation 89%.  Patient is also found to be tachycardic.   04:00 PM X-ray negative for PNA or infiltrate but bowel pattern concerning for SBO vs  ileus. Patient with no active vomiting since arrival in the ED. Remains on 4L [place]. No CP to suggest PE. Plan for CT of the abdomen/pelvis and reassess. Likely admit but will need psych evaluation.   Care transferred to Dr. Darl Householder who will follow CT scan results and likely admit for further monitoring. Will need psychiatry consultation when medically clear.  ____________________________________________  FINAL CLINICAL IMPRESSION(S) / ED DIAGNOSES  Final diagnoses:  COPD exacerbation ([place])  Acute pancreatitis, unspecified complication status, unspecified pancreatitis type     MEDICATIONS GIVEN DURING THIS VISIT:  Medications  iopamidol (ISOVUE-300) 61 % injection (  Canceled Entry 11/17/17 1746)  sodium chloride 0.9 % bolus 1,000 mL (0 mLs Intravenous Stopped 11/17/17 1618)  ondansetron (ZOFRAN) injection 4 mg (4 mg Intravenous Given 11/17/17 1454)  ketorolac (TORADOL) 30 MG/ML injection 30 mg (30 mg Intravenous Given 11/17/17 1636)  iopamidol (ISOVUE-300) 61 % injection 100  mL (100 mLs Intravenous Contrast Given 11/17/17 1700)  sodium chloride 0.9 % injection (  Given 11/17/17 1744)  iopamidol (ISOVUE-370) 76 % injection 100 mL ( Intravenous Canceled Entry 11/17/17 1743)    Note:  This document was prepared using Dragon voice recognition software and may include unintentional dictation errors.  Nanda Quinton, MD Emergency Medicine    Long, [facility], MD 11/17/17 2001

## 2017-11-17 NOTE — ED Notes (Signed)
ED Provider at bedside. 

## 2017-11-17 NOTE — ED Notes (Signed)
ED TO INPATIENT HANDOFF REPORT  Name/Age/Gender Hayley [name] 72 y.o. female  Code Status Code Status History    Date Active Date Inactive Code Status Order ID Comments User Context   03/22/2016 18:56 03/23/2016 20:49 DNR [id number]  Samella Parr, NP Inpatient   06/03/2015 21:52 06/07/2015 20:57 Full Code [id number]  Theressa Millard, MD Inpatient   12/06/2014 12:10 12/13/2014 17:50 Full Code [id number]  Gaynelle Arabian, MD Inpatient   02/24/2014 15:03 02/27/2014 14:25 Full Code [id number]  Gearlean Alf, MD Inpatient   12/21/2013 22:54 12/26/2013 22:01 Full Code [id number]  Toy Baker, MD ED    Questions for Most Recent Historical Code Status (Order [id number])    Question Answer Comment   In the event of cardiac or respiratory ARREST Do not call a "code blue"    In the event of cardiac or respiratory ARREST Do not perform Intubation, CPR, defibrillation or ACLS    In the event of cardiac or respiratory ARREST Use medication by any route, position, wound care, and other measures to relive pain and suffering. May use oxygen, suction and manual treatment of airway obstruction as needed for comfort.       Home/SNF/Other Home  Chief Complaint AMS / headache / N/V  Level of Care/Admitting Diagnosis ED Disposition    ED Disposition Condition Comment   Admit  Hospital Area: [facility] [[id number]]  Level of Care: Med-Surg [16]  Diagnosis: Pancreatitis [[id number]]  Admitting Physician: Bennie Pierini [[id number]]  Attending Physician: Jonnie Finner, [place] [[id number]]  Estimated length of stay: past midnight tomorrow  Certification:: I certify this patient will need inpatient services for at least 2 midnights  PT Class (Do Not Modify): Inpatient [101]  PT Acc Code (Do Not Modify): Private [1]       Medical History Past Medical History:  Diagnosis Date  . Anemia   . Anxiety   . Bronchitis    hx of   . Clotting disorder ([place]) 02-17-14   S/p appendectomy-developed  Pulmonary emboli-tx. warfarin-d/c 1 yr ago.  . Colon polyps   . Depression   . Diabetes mellitus without complication ([place])    "borderline"- oral med  . Disorder of vocal cords    spasmotic dysphonia ,02-17-14 has" whispery voice-low tone"  . Hyperthyroidism   . Kidney stones   . Migraines   . Nausea Nov 15, 2014   cycle of migraine headaches and nausea  . Neuromuscular disorder ([place])    hands and throat"spasmodic dysphonia", tremors - thimb and forefinger   . Obesity, Class III, BMI 40-49.9 (morbid obesity) ([place]) 04/27/2010   02-17-14 reports some weight loss- intentional  . Osteoarthritis    in back(chronic pain)  . Pelvic pain    02-17-14 "states thinks its back related on left groin"  . Pulmonary emboli [facility])    s/p Appendectomy '13 [facility]  . Reflux   . Right knee pain   . Sleep apnea    cpap - settings at 4   . Toxic goiter     Allergies Allergies  Allergen Reactions  . Bee Venom Anaphylaxis  . Tape Rash    Rash with paper tape  . Etodolac Diarrhea and Nausea And Vomiting  . Morphine Other (See Comments)    Hallucinations  . Other Itching and Swelling  . Poison Ivy Extract  [Poison Ivy Extract] Itching and Swelling  . Propoxyphene Nausea And Vomiting  . Propoxyphene N-Acetaminophen Nausea And Vomiting  . Penicillins Rash    Has  patient had a PCN reaction causing immediate rash, facial/tongue/throat swelling, SOB or lightheadedness with hypotension: Yes Has patient had a PCN reaction causing severe rash involving mucus membranes or skin necrosis: No Has patient had a PCN reaction that required hospitalization No Has patient had a PCN reaction occurring within the last 10 years: No If all of the above answers are "NO", then may proceed with Cephalosporin use.     IV Location/Drains/Wounds Patient Lines/Drains/Airways Status   Active Line/Drains/Airways    Name:   Placement date:   Placement time:   Site:   Days:   Peripheral IV 11/17/17 Right Antecubital   11/17/17     1335    Antecubital   less than 1          Labs/Imaging Results for orders placed or performed during the hospital encounter of 11/17/17 (from the past 48 hour(s))  Comprehensive metabolic panel     Status: Abnormal   Collection Time: 11/17/17  1:40 PM  Result Value Ref Range   Sodium 137 135 - 145 mmol/L   Potassium 3.3 (L) 3.5 - 5.1 mmol/L   Chloride 99 (L) 101 - 111 mmol/L   CO2 29 22 - 32 mmol/L   Glucose, Bld 207 (H) 65 - 99 mg/dL   BUN 7 6 - 20 mg/dL   Creatinine, Ser 0.67 0.44 - 1.00 mg/dL   Calcium 8.8 (L) 8.9 - 10.3 mg/dL   Total Protein 7.1 6.5 - 8.1 g/dL   Albumin 3.6 3.5 - 5.0 g/dL   AST 28 15 - 41 U/L   ALT 18 14 - 54 U/L   Alkaline Phosphatase 61 38 - 126 U/L   Total Bilirubin 0.7 0.3 - 1.2 mg/dL   GFR calc non Af Amer >60 >60 mL/min   GFR calc Af Amer >60 >60 mL/min    Comment: (NOTE) The eGFR has been calculated using the CKD EPI equation. This calculation has not been validated in all clinical situations. eGFR's persistently <60 mL/min signify possible Chronic Kidney Disease.    Anion gap 9 5 - 15    Comment: Performed at [facility], [place] [address], [place], [place] [address]  CBC     Status: None   Collection Time: 11/17/17  1:40 PM  Result Value Ref Range   WBC 10.0 4.0 - 10.5 K/uL   RBC 4.92 3.87 - 5.11 MIL/uL   Hemoglobin 14.9 12.0 - 15.0 g/dL   HCT 45.6 36.0 - 46.0 %   MCV 92.7 78.0 - 100.0 fL   MCH 30.3 26.0 - 34.0 pg   MCHC 32.7 30.0 - 36.0 g/dL   RDW 13.8 11.5 - 15.5 %   Platelets 247 150 - 400 K/uL    Comment: Performed at [facility], [place] [address] [place], [place] [address]  Protime-INR - (order if patient is taking Coumadin / Warfarin)     Status: None   Collection Time: 11/17/17  1:40 PM  Result Value Ref Range   Prothrombin Time 13.6 11.4 - 15.2 seconds   INR 1.05     Comment: Performed at [facility], [place] [address] [place], [place] [address]  Lipase, blood      Status: Abnormal   Collection Time: 11/17/17  1:40 PM  Result Value Ref Range   Lipase 82 (H) 11 - 51 U/L    Comment: Performed at [facility], [place] [address], [place], [place] [address]  CBG monitoring, ED  Status: Abnormal   Collection Time: 11/17/17  1:52 PM  Result Value Ref Range   Glucose-Capillary 195 (H) 65 - 99 mg/dL  I-Stat Troponin, ED (not at [facility])     Status: None   Collection Time: 11/17/17  2:15 PM  Result Value Ref Range   Troponin i, poc 0.00 0.00 - 0.08 ng/mL   Comment 3            Comment: Due to the release kinetics of cTnI, a negative result within the first hours of the onset of symptoms does not rule out myocardial infarction with certainty. If myocardial infarction is still suspected, repeat the test at appropriate intervals.   Urinalysis, Routine w reflex microscopic     Status: Abnormal   Collection Time: 11/17/17  2:16 PM  Result Value Ref Range   Color, Urine YELLOW YELLOW   APPearance CLEAR CLEAR   Specific Gravity, Urine 1.003 (L) 1.005 - 1.030   pH 7.0 5.0 - 8.0   Glucose, UA NEGATIVE NEGATIVE mg/dL   Hgb urine dipstick NEGATIVE NEGATIVE   Bilirubin Urine NEGATIVE NEGATIVE   Ketones, ur NEGATIVE NEGATIVE mg/dL   Protein, ur NEGATIVE NEGATIVE mg/dL   Nitrite NEGATIVE NEGATIVE   Leukocytes, UA NEGATIVE NEGATIVE    Comment: Performed at [facility] [address] [place], [place] [address]  I-Stat CG4 Lactic Acid, ED     Status: None   Collection Time: 11/17/17  2:17 PM  Result Value Ref Range   Lactic Acid, Venous 1.56 0.5 - 1.9 mmol/L  Urine rapid drug screen (hosp performed)     Status: None   Collection Time: 11/17/17  2:20 PM  Result Value Ref Range   Opiates NONE DETECTED NONE DETECTED   Cocaine NONE DETECTED NONE DETECTED   Benzodiazepines NONE DETECTED NONE DETECTED   Amphetamines NONE DETECTED NONE DETECTED   Tetrahydrocannabinol NONE DETECTED NONE DETECTED   Barbiturates NONE DETECTED  NONE DETECTED    Comment: (NOTE) DRUG SCREEN FOR MEDICAL PURPOSES ONLY.  IF CONFIRMATION IS NEEDED FOR ANY PURPOSE, NOTIFY LAB WITHIN 5 DAYS. LOWEST DETECTABLE LIMITS FOR URINE DRUG SCREEN Drug Class                     Cutoff (ng/mL) Amphetamine and metabolites    1000 Barbiturate and metabolites    200 Benzodiazepine                 262 Tricyclics and metabolites     300 Opiates and metabolites        300 Cocaine and metabolites        300 THC                            50 Performed at [facility], [place] [address], [place], [place] [address]   Acetaminophen level     Status: Abnormal   Collection Time: 11/17/17  2:21 PM  Result Value Ref Range   Acetaminophen (Tylenol), Serum <10 (L) 10 - 30 ug/mL    Comment:        THERAPEUTIC CONCENTRATIONS VARY SIGNIFICANTLY. A RANGE OF 10-30 ug/mL MAY BE AN EFFECTIVE CONCENTRATION FOR MANY PATIENTS. HOWEVER, SOME ARE BEST TREATED AT CONCENTRATIONS OUTSIDE THIS RANGE. ACETAMINOPHEN CONCENTRATIONS >150 ug/mL AT 4 HOURS AFTER INGESTION AND >50 ug/mL AT 12 HOURS AFTER INGESTION ARE OFTEN ASSOCIATED WITH TOXIC REACTIONS. Performed at [facility], [place] [address] [place] [address]   Salicylate level     Status: None   Collection Time: 11/17/17  2:21 PM  Result Value Ref Range   Salicylate Lvl <7.6 2.8 - 30.0 mg/dL    Comment: Performed at [facility], [place] [address] [place], [place] [address]  Influenza panel by PCR (type A & B)     Status: None   Collection Time: 11/17/17  4:37 PM  Result Value Ref Range   Influenza A By PCR NEGATIVE NEGATIVE   Influenza B By PCR NEGATIVE NEGATIVE    Comment: (NOTE) The Xpert Xpress Flu assay is intended as an aid in the diagnosis of  influenza and should not be used as a sole basis for treatment.  This  assay is FDA approved for nasopharyngeal swab specimens only. Nasal  washings and aspirates are unacceptable for Xpert Xpress  Flu testing. Performed at [facility], [place] [address], [place], [place] [address]    Ct Head Wo Contrast  Result Date: 11/17/2017 CLINICAL DATA:  Patient with nausea and vomiting. Declined mental status. Contrast was given prior to study or abdomen pelvis CT. EXAM: CT HEAD WITHOUT CONTRAST TECHNIQUE: Contiguous axial images were obtained from the base of the skull through the vertex without intravenous contrast. COMPARISON:  Brain CT 03/22/2016. FINDINGS: Brain: The ventricles and sulci are appropriate for patient's age. No evidence for acute cortically based infarct, intracranial hemorrhage, mass lesion or mass-effect. Vascular: Unremarkable. Skull: Intact. Sinuses/Orbits: Paranasal sinuses are well aerated. Mastoid air cells are unremarkable. Orbits are unremarkable. Other: None. IMPRESSION: No acute intracranial process. Electronically Signed   By: Lovey Newcomer M.D.   On: 11/17/2017 18:11   Ct Angio Chest Pe W And/or Wo Contrast  Result Date: 11/17/2017 CLINICAL DATA:  72 year old female with history of shortness of breath and chest pain. EXAM: CT ANGIOGRAPHY CHEST WITH CONTRAST TECHNIQUE: Multidetector CT imaging of the chest was performed using the standard protocol during bolus administration of intravenous contrast. Multiplanar CT image reconstructions and MIPs were obtained to evaluate the vascular anatomy. CONTRAST:  60 mL ISOVUE-370 IOPAMIDOL (ISOVUE-370) INJECTION 76% COMPARISON:  Chest CT 08/02/2017. FINDINGS: Cardiovascular: There are no filling defects within the pulmonary arterial tree to suggest underlying pulmonary embolism. Heart size is normal. There is no significant pericardial fluid, thickening or pericardial calcification. There is aortic atherosclerosis, as well as atherosclerosis of the great vessels of the mediastinum and the coronary arteries, including calcified atherosclerotic plaque in the left main and left anterior descending coronary arteries.  Mediastinum/Nodes: No pathologically enlarged mediastinal or hilar lymph nodes. Esophagus is unremarkable in appearance. No axillary lymphadenopathy. Lungs/Pleura: A few scattered areas of subsegmental atelectasis and/or scarring are noted in the lung bases bilaterally. No consolidative airspace disease. No pleural effusions. No suspicious appearing pulmonary nodules or masses are noted. Upper Abdomen: Diffuse low attenuation throughout the visualized portions of the liver, indicative of severe hepatic steatosis. Status post cholecystectomy. Musculoskeletal: Bilateral breast implants incidentally noted with dense capsular calcifications on the right side. There are no aggressive appearing lytic or blastic lesions noted in the visualized portions of the skeleton. Spinal cord stimulator in the midthoracic region. Review of the MIP images confirms the above findings. IMPRESSION: 1. No evidence of pulmonary embolism. 2. No acute findings are noted in the thorax to account for the patient's symptoms. 3. Aortic atherosclerosis, in addition to left main and left anterior descending coronary artery disease. Assessment for potential risk factor modification, dietary therapy or pharmacologic therapy may be  warranted, if clinically indicated. 4. Severe hepatic steatosis. Aortic Atherosclerosis (ICD10-I70.0). Electronically Signed   By: Vinnie Langton M.D.   On: 11/17/2017 18:13   Ct Abdomen Pelvis W Contrast  Result Date: 11/17/2017 CLINICAL DATA:  Mid to lower abdominal pain for 2 or 3 weeks. EXAM: CT ABDOMEN AND PELVIS WITH CONTRAST TECHNIQUE: Multidetector CT imaging of the abdomen and pelvis was performed using the standard protocol following bolus administration of intravenous contrast. CONTRAST:  159m ISOVUE-300 IOPAMIDOL (ISOVUE-300) INJECTION 61% COMPARISON:  December 09, 2014 FINDINGS: Lower chest: No acute abnormality. Hepatobiliary: Hepatic steatosis. Previous cholecystectomy. The portal vein is patent. The liver  is otherwise normal. Pancreas: There is fat stranding near the head of the pancreas. The remainder of the pancreas is normal. Spleen: Normal in size without focal abnormality. Adrenals/Urinary Tract: Adrenal glands are unremarkable. Kidneys are normal, without renal calculi, focal lesion, or hydronephrosis. Bladder is unremarkable. Stomach/Bowel: The stomach is normal. There is inflammation of the second portion of the duodenum. There are duodenal diverticuli in this region. The inflammation is thought to be secondary to pancreatitis rather than from the duodenum given an elevated lipase. The small bowel is otherwise normal. Colonic diverticulosis without diverticulitis. The appendix is not visualized. Vascular/Lymphatic: Mild atherosclerotic change in the abdominal aorta. No aneurysm or dissection. Reproductive: Status post hysterectomy. No adnexal masses. Other: No abdominal wall hernia or abnormality. No abdominopelvic ascites. Musculoskeletal: No acute or significant osseous findings. IMPRESSION: 1. Fat stranding adjacent to the pancreas and adjacent duodenum/duodenal diverticuli is thought to be secondary to pancreatitis given the patient's elevated lipase. 2. Colonic diverticulosis without diverticulitis. 3. Atherosclerotic change in the abdominal aorta. Electronically Signed   By: DDorise BullionIII M.D   On: 11/17/2017 17:48   Dg Abdomen Acute W/chest  Result Date: 11/17/2017 CLINICAL DATA:  Vomiting, shortness of breath. EXAM: DG ABDOMEN ACUTE W/ 1V CHEST COMPARISON:  None. FINDINGS: Dilated small bowel loops are noted in left upper quadrant. No colonic dilatation is noted. No radiopaque calculi or other significant radiographic abnormality is seen. Heart size and mediastinal contours are within normal limits. Both lungs are clear. IMPRESSION: Dilated small bowel loops seen in left upper quadrant of abdomen concerning for distal small bowel obstruction or focal ileus; follow-up radiographs are  recommended. No acute cardiopulmonary disease. Electronically Signed   By: JMarijo Conception M.D.   On: 11/17/2017 15:41    Pending Labs Unresulted Labs (From admission, onward)   Start     Ordered   11/17/17 1429  TSH  STAT,   STAT     11/17/17 1428   11/17/17 1416  Urine culture  STAT,   STAT    Question:  Patient immune status  Answer:  Normal   11/17/17 1418   Signed and Held  Vitamin B12  Once,   R     Signed and Held   Signed and Held  Magnesium  Add-on,   R     Signed and Held   Signed and Held  Phosphorus  Add-on,   R     Signed and Held   Signed and Held  Brain natriuretic peptide  Add-on,   R     Signed and Held   SVisual merchandiserand HOccupational hygienistmorning,   R     Signed and Held   Signed and Held  CBC  Tomorrow morning,   R     Signed and Held   Signed and Held  Triglycerides  Add-on,   R     Signed and Held      Vitals/Pain Today's Vitals   11/17/17 1740 11/17/17 1800 11/17/17 1900 11/17/17 1930  BP: 113/68 105/71 (!) 99/53 110/74  Pulse: (!) 105 (!) 104 (!) 108 (!) 108  Resp: 17 16 (!) 22 20  Temp:      TempSrc:      SpO2: 96% 94% 95% 90%  PainSc:        Isolation Precautions No active isolations  Medications Medications  iopamidol (ISOVUE-300) 61 % injection (  Canceled Entry 11/17/17 1746)  sodium chloride 0.9 % bolus 1,000 mL (0 mLs Intravenous Stopped 11/17/17 1618)  ondansetron (ZOFRAN) injection 4 mg (4 mg Intravenous Given 11/17/17 1454)  ketorolac (TORADOL) 30 MG/ML injection 30 mg (30 mg Intravenous Given 11/17/17 1636)  iopamidol (ISOVUE-300) 61 % injection 100 mL (100 mLs Intravenous Contrast Given 11/17/17 1700)  sodium chloride 0.9 % injection (  Given 11/17/17 1744)  iopamidol (ISOVUE-370) 76 % injection 100 mL ( Intravenous Canceled Entry 11/17/17 1743)    Mobility walks

## 2017-11-17 NOTE — ED Triage Notes (Signed)
Pt husband states the pt has had n/v for the past week and has not been acting herself lately. Son states the pt has been declining mentally for the past few months. Husband states the pt told him she tried to commit suicide last week but did not say how. Pt states she does not want to live and had planned to kill herself.

## 2017-11-17 NOTE — H&P (Signed)
History and Physical    DEBANY VANTOL [id number] DOB: January 12, 1946 DOA: 11/17/2017  PCP: Pa, [place] Patient coming from: Home  I have personally briefly reviewed patient's old medical records in [place]  Chief Complaint: Suicide attempt, abdominal pain  HPI: Hayley [name] is a 72 y.o. female with medical history significant for depression, T2DM, COPD, CAD, hypothyroidism, b/l paresthesias and essential tremor followed by Neurology who presents to the ED from home with her daughter and son-in-law after apparent suicide attempt on Friday, 3/1. At that time, the patient took 30 pills of Percocet with the intention to kill herself. She reports significant stressors in her life, including the poor health for both herself and her husband, as well as financial difficulties that have been worsening recently. She denies an active suicidal plan at the time of this interview, however does state that she remains passively suicidal. Within the past few weeks, the patient is also discontinued all her home medications. She says that she is feeling quite poorly, noting abdominal pain with significant nausea and vomiting, headache, and exertional shortness of breath. She denies fever, chills, or other infectious symptoms. She describes abdominal pain as gnawing and diffuse. She reports anorexia due to these symptoms. The daughter also raise concern for potential hallucinations, as the patient stated a few weeks ago that she was seeing bedbugs in her room, despite her family not noting the presence of any bones. The patient denies visual or auditory hallucinations at this time.  ED Course: In the ED, patient afebrile, tachycardic to the 120s, normotensive and requiring 3 L of supplemental oxygen to maintain oxygen saturation above 90%. Labs were notable for potassium 3.3, glucose 207, lipase 82, normal LFTs. CT abdomen pelvis with contrast showed fat stranding adjacent pancreas consistent  with likely pancreatitis. CT chest showed no PE, atherosclerosis of coronary arteries and aorta, and severe hepatic steatosis. CT head showed no acute findings. Patient received Toradol IV fluids while in the ED. Patient stable on arrival to the floor.  Review of Systems: As per HPI otherwise 10 point review of systems negative.   Past Medical History:  Diagnosis Date  . Anemia   . Anxiety   . Bronchitis    hx of   . Clotting disorder ([place]) 02-17-14   S/p appendectomy-developed Pulmonary emboli-tx. warfarin-d/c 1 yr ago.  . Colon polyps   . Depression   . Diabetes mellitus without complication ([place])    "borderline"- oral med  . Disorder of vocal cords    spasmotic dysphonia ,02-17-14 has" whispery voice-low tone"  . Hyperthyroidism   . Kidney stones   . Migraines   . Nausea Nov 15, 2014   cycle of migraine headaches and nausea  . Neuromuscular disorder ([place])    hands and throat"spasmodic dysphonia", tremors - thimb and forefinger   . Obesity, Class III, BMI 40-49.9 (morbid obesity) ([place]) 04/27/2010   02-17-14 reports some weight loss- intentional  . Osteoarthritis    in back(chronic pain)  . Pelvic pain    02-17-14 "states thinks its back related on left groin"  . Pulmonary emboli [facility])    s/p Appendectomy '13 [facility]  . Reflux   . Right knee pain   . Sleep apnea    cpap - settings at 4   . Toxic goiter     Past Surgical History:  Procedure Laterality Date  . ABDOMINAL HYSTERECTOMY    . APPENDECTOMY     3'13 [facility] s/p developed  Pulmonary  Emboli  . BOTOX INJECTION     for migranes  . CATARACT EXTRACTION, BILATERAL Bilateral 03/2013   . CHOLECYSTECTOMY    . HAND SURGERY  2004   both hands  . KIDNEY STONE SURGERY    . KNEE ARTHROSCOPY  1998   right  . LAPAROSCOPIC APPENDECTOMY  11/27/2011   Procedure: APPENDECTOMY LAPAROSCOPIC;  Surgeon: Adin Hector, MD;  Location: [facility] ORS;  Service: General;  Laterality: N/A;  . SPINAL CORD STIMULATOR IMPLANT  06/04/2016   Dr. Alfonse Ras  at [facility]  . TOTAL KNEE ARTHROPLASTY Right 12/06/2014   Procedure: RIGHT TOTAL KNEE ARTHROPLASTY;  Surgeon: Gaynelle Arabian, MD;  Location: [facility]S;  Service: Orthopedics;  Laterality: Right;  . TOTAL KNEE REVISION Left 02/24/2014   Procedure: LEFT TOTAL KNEE REVISION;  Surgeon: Gearlean Alf, MD;  Location: [facility];  Service: Orthopedics;  Laterality: Left;  . vocal cord surgery  02-17-14   01-12-14 done in [place] [facility](s/p selective denervation/reinnervation recurrent laryngeal nerve surgery)  . zenkers diverticulum       reports that she quit smoking about 35 years ago. she has never used smokeless tobacco. She reports that she does not drink alcohol or use drugs.  Allergies  Allergen Reactions  . Bee Venom Anaphylaxis  . Tape Rash    Rash with paper tape  . Etodolac Diarrhea and Nausea And Vomiting  . Morphine Other (See Comments)    Hallucinations  . Other Itching and Swelling  . Poison Ivy Extract  [Poison Ivy Extract] Itching and Swelling  . Propoxyphene Nausea And Vomiting  . Propoxyphene N-Acetaminophen Nausea And Vomiting  . Penicillins Rash    Has patient had a PCN reaction causing immediate rash, facial/tongue/throat swelling, SOB or lightheadedness with hypotension: Yes Has patient had a PCN reaction causing severe rash involving mucus membranes or skin necrosis: No Has patient had a PCN reaction that required hospitalization No Has patient had a PCN reaction occurring within the last 10 years: No If all of the above answers are "NO", then may proceed with Cephalosporin use.     Family History  Problem Relation Age of Onset  . Heart disease Mother   . Pneumonia Father   . Cirrhosis Father   . Hypertension Father   . Alcoholism Father   . Heart attack Paternal Grandfather   . Heart attack Maternal Grandfather   . Heart attack Paternal Grandmother   . Heart attack Maternal Grandmother    Prior to Admission medications   Medication Sig Start Date  End Date Taking? Authorizing Provider  albuterol (PROVENTIL HFA;VENTOLIN HFA) 108 (90 BASE) MCG/ACT inhaler Inhale 1 puff into the lungs every 6 (six) hours as needed for wheezing or shortness of breath.   Yes [provider]  albuterol (PROVENTIL) (2.5 MG/3ML) 0.083% nebulizer solution Take 2.5 mg by nebulization every 6 (six) hours as needed for wheezing or shortness of breath. Reported on 01/10/2016   Yes [provider]  apixaban (ELIQUIS) 2.5 MG TABS tablet Take 1 tablet (2.5 mg total) by mouth 2 (two) times daily. 12/20/16  Yes Magrinat, Virgie Dad, MD  benzonatate (TESSALON) 100 MG capsule Take 100 mg by mouth 2 (two) times daily as needed for cough.   Yes [provider]  BIOTIN PO Take 1 capsule by mouth daily.   Yes [provider]  Botulinum Toxin Type A (BOTOX) 200 units SOLR Inject as directed every 3 (three) months.   Yes [provider]  buPROPion [facility]  XL) 300 MG 24 hr tablet Take 300 mg by mouth daily. 11/14/17  Yes [provider]  clonazePAM (KLONOPIN) 0.5 MG tablet Take 1 tablet (0.5 mg total) at bedtime by mouth. 07/22/17  Yes Marcial Pacas, MD  cyanocobalamin (,VITAMIN B-12,) 1000 MCG/ML injection Inject 1 mL into the skin every 30 (thirty) days. 04/03/16  Yes [provider]  diphenhydrAMINE (BENADRYL) 25 MG tablet Take 50 mg by mouth every 6 (six) hours as needed for itching.   Yes [provider]  gabapentin (NEURONTIN) 300 MG capsule TAKE 2 CAPSULES (600 MG TOTAL) BY MOUTH 3 (THREE) TIMES DAILY. 06/20/17  Yes Marcial Pacas, MD  glipiZIDE (GLUCOTROL XL) 5 MG 24 hr tablet Take 5 mg by mouth daily with breakfast.   Yes [provider]  levothyroxine (SYNTHROID, LEVOTHROID) 25 MCG tablet Take 25 mcg by mouth every morning.    Yes [provider]  mometasone-formoterol (DULERA) 100-5 MCG/ACT AERO Inhale 2 puffs into the lungs 2 (two) times daily.   Yes [provider]  ondansetron  (ZOFRAN-ODT) 4 MG disintegrating tablet Take 1 tablet (4 mg total) by mouth every 8 (eight) hours as needed for nausea or vomiting. 06/05/17  Yes Marcial Pacas, MD  oxyCODONE-acetaminophen (PERCOCET/ROXICET) 5-325 MG tablet Take 1 tablet by mouth 2 (two) times daily as needed for pain. 09/18/17  Yes [provider]  Probiotic Product (ACIDOPHILUS/GOAT MILK) CAPS Take by mouth daily.   Yes [provider]  promethazine (PHENERGAN) 25 MG tablet Take 25 mg by mouth every 12 (twelve) hours as needed for nausea/vomiting. 11/11/17  Yes [provider]  propranolol (INDERAL) 60 MG tablet Take 1 tablet (60 mg total) by mouth 2 (two) times daily. 12/19/16  Yes Marcial Pacas, MD  sitaGLIPtin (JANUVIA) 100 MG tablet Take 100 mg by mouth daily.   Yes [provider]  SUMAtriptan (IMITREX) 25 MG tablet Take 1 tablet (25 mg total) by mouth every 2 (two) hours as needed for migraine. May repeat in 2 hours if headache persists or recurs. 12/19/16  Yes Marcial Pacas, MD  SUMAtriptan Succinate Refill 4 MG/0.5ML SOCT Inject 4 mg into the skin as needed. Every 3 months 12/19/16  Yes Marcial Pacas, MD  valACYclovir (VALTREX) 1000 MG tablet Take 2,000 mg by mouth 2 (two) times daily as needed (Fever blisters). Reported on 01/10/2016 10/14/14  Yes [provider]  VIRTUSSIN A/C 100-10 MG/5ML syrup Take 5 mLs by mouth every 6 (six) hours as needed for cough. 10/25/17  Yes [provider]  cyclobenzaprine (FLEXERIL) 10 MG tablet Take 1 tablet (10 mg total) by mouth at bedtime. Patient not taking: Reported on 11/17/2017 06/05/17   Marcial Pacas, MD  DULoxetine (CYMBALTA) 60 MG capsule TAKE 1 CAPSULE EVERY MORNING Patient not taking: Reported on 11/17/2017 03/13/17   Marcial Pacas, MD  gabapentin (NEURONTIN) 300 MG capsule Take 2 capsules (600 mg total) by mouth 3 (three) times daily. Patient not taking: Reported on 11/17/2017 10/08/16   Marcial Pacas, MD  metroNIDAZOLE (FLAGYL) 500 MG tablet Take 1 tablet (500 mg  total) by mouth 2 (two) times daily. Patient not taking: Reported on 11/17/2017 10/11/16   Megan Salon, MD  terconazole (TERAZOL 7) 0.4 % vaginal cream Place 1 applicator vaginally at bedtime. Patient not taking: Reported on 11/17/2017 10/02/16   Megan Salon, MD  vitamin B-12 500 MCG tablet Take 1 tablet (500 mcg total) by mouth daily. Patient not taking: Reported on 11/17/2017 03/23/16   Orson Eva, MD  Physical Exam: Vitals:   11/17/17 1800 11/17/17 1900 11/17/17 1930 11/17/17 2049  BP: 105/71 (!) 99/53 110/74 101/65  Pulse: (!) 104 (!) 108 (!) 108 (!) 108  Resp: 16 (!) 22 20 18   Temp:    99 F (37.2 C)  TempSrc:      SpO2: 94% 95% 90% 92%    Constitutional: NAD, calm, comfortable Vitals:   11/17/17 1800 11/17/17 1900 11/17/17 1930 11/17/17 2049  BP: 105/71 (!) 99/53 110/74 101/65  Pulse: (!) 104 (!) 108 (!) 108 (!) 108  Resp: 16 (!) 22 20 18   Temp:    99 F (37.2 C)  TempSrc:      SpO2: 94% 95% 90% 92%   Eyes: PERRL, lids and conjunctivae normal ENMT: Mucous membranes are dry and tacky Neck: normal, supple, no masses Respiratory: clear to auscultation bilaterally, no wheezing, no crackles. Cardiovascular: Regular rate and rhythm, no murmurs / rubs / gallops. No extremity edema. 2+ pedal pulses Abdomen: Periumbilical tenderness present. Bowel sounds positive.  Musculoskeletal: no clubbing / cyanosis. No joint deformity upper and lower extremities. Good ROM, no contractures Skin: no rashes, lesions, ulcers. No induration  Neurologic: CN 2-12 grossly intact. Sensation intact, DTR normal. Strength 5/5 in all 4.  Psychiatric: Depressed. Alert and oriented x 3.  Labs on Admission: I have personally reviewed following labs and imaging studies  CBC: Recent Labs  Lab 11/17/17 1340  WBC 10.0  HGB 14.9  HCT 45.6  MCV 92.7  PLT 664   Basic Metabolic Panel: Recent Labs  Lab 11/17/17 1340  NA 137  K 3.3*  CL 99*  CO2 29  GLUCOSE 207*  BUN 7  CREATININE 0.67    CALCIUM 8.8*   GFR: CrCl cannot be calculated (Unknown ideal weight.). Liver Function Tests: Recent Labs  Lab 11/17/17 1340  AST 28  ALT 18  ALKPHOS 61  BILITOT 0.7  PROT 7.1  ALBUMIN 3.6   Recent Labs  Lab 11/17/17 1340  LIPASE 82*   No results for input(s): AMMONIA in the last 168 hours. Coagulation Profile: Recent Labs  Lab 11/17/17 1340  INR 1.05   Cardiac Enzymes: No results for input(s): CKTOTAL, CKMB, CKMBINDEX, TROPONINI in the last 168 hours. BNP (last 3 results) No results for input(s): PROBNP in the last 8760 hours. HbA1C: No results for input(s): HGBA1C in the last 72 hours. CBG: Recent Labs  Lab 11/17/17 1352  GLUCAP 195*   Lipid Profile: No results for input(s): CHOL, HDL, LDLCALC, TRIG, CHOLHDL, LDLDIRECT in the last 72 hours. Thyroid Function Tests: Recent Labs    11/17/17 2050  TSH 1.348   Anemia Panel: No results for input(s): VITAMINB12, FOLATE, FERRITIN, TIBC, IRON, RETICCTPCT in the last 72 hours. Urine analysis:    Component Value Date/Time   COLORURINE YELLOW 11/17/2017 [place] 11/17/2017 1416   APPEARANCEUR CLOUDY 12/19/2014 0730   LABSPEC 1.003 (L) 11/17/2017 1416   LABSPEC 1.025 12/19/2014 0730   PHURINE 7.0 11/17/2017 1416   GLUCOSEU NEGATIVE 11/17/2017 1416   GLUCOSEU see comment 12/19/2014 0730   HGBUR NEGATIVE 11/17/2017 1416   BILIRUBINUR NEGATIVE 11/17/2017 1416   BILIRUBINUR n 05/08/2016 1506   BILIRUBINUR see comment 12/19/2014 0730   KETONESUR NEGATIVE 11/17/2017 1416   PROTEINUR NEGATIVE 11/17/2017 1416   UROBILINOGEN negative 05/08/2016 1506   UROBILINOGEN 1.0 11/30/2014 1215   NITRITE NEGATIVE 11/17/2017 1416   LEUKOCYTESUR NEGATIVE 11/17/2017 1416   LEUKOCYTESUR see comment 12/19/2014 0730    Radiological Exams on  Admission: Ct Head Wo Contrast  Result Date: 11/17/2017 CLINICAL DATA:  Patient with nausea and vomiting. Declined mental status. Contrast was given prior to study or  abdomen pelvis CT. EXAM: CT HEAD WITHOUT CONTRAST TECHNIQUE: Contiguous axial images were obtained from the base of the skull through the vertex without intravenous contrast. COMPARISON:  Brain CT 03/22/2016. FINDINGS: Brain: The ventricles and sulci are appropriate for patient's age. No evidence for acute cortically based infarct, intracranial hemorrhage, mass lesion or mass-effect. Vascular: Unremarkable. Skull: Intact. Sinuses/Orbits: Paranasal sinuses are well aerated. Mastoid air cells are unremarkable. Orbits are unremarkable. Other: None. IMPRESSION: No acute intracranial process. Electronically Signed   By: Lovey Newcomer M.D.   On: 11/17/2017 18:11   Ct Angio Chest Pe W And/or Wo Contrast  Result Date: 11/17/2017 CLINICAL DATA:  72 year old female with history of shortness of breath and chest pain. EXAM: CT ANGIOGRAPHY CHEST WITH CONTRAST TECHNIQUE: Multidetector CT imaging of the chest was performed using the standard protocol during bolus administration of intravenous contrast. Multiplanar CT image reconstructions and MIPs were obtained to evaluate the vascular anatomy. CONTRAST:  60 mL ISOVUE-370 IOPAMIDOL (ISOVUE-370) INJECTION 76% COMPARISON:  Chest CT 08/02/2017. FINDINGS: Cardiovascular: There are no filling defects within the pulmonary arterial tree to suggest underlying pulmonary embolism. Heart size is normal. There is no significant pericardial fluid, thickening or pericardial calcification. There is aortic atherosclerosis, as well as atherosclerosis of the great vessels of the mediastinum and the coronary arteries, including calcified atherosclerotic plaque in the left main and left anterior descending coronary arteries. Mediastinum/Nodes: No pathologically enlarged mediastinal or hilar lymph nodes. Esophagus is unremarkable in appearance. No axillary lymphadenopathy. Lungs/Pleura: A few scattered areas of subsegmental atelectasis and/or scarring are noted in the lung bases bilaterally. No  consolidative airspace disease. No pleural effusions. No suspicious appearing pulmonary nodules or masses are noted. Upper Abdomen: Diffuse low attenuation throughout the visualized portions of the liver, indicative of severe hepatic steatosis. Status post cholecystectomy. Musculoskeletal: Bilateral breast implants incidentally noted with dense capsular calcifications on the right side. There are no aggressive appearing lytic or blastic lesions noted in the visualized portions of the skeleton. Spinal cord stimulator in the midthoracic region. Review of the MIP images confirms the above findings. IMPRESSION: 1. No evidence of pulmonary embolism. 2. No acute findings are noted in the thorax to account for the patient's symptoms. 3. Aortic atherosclerosis, in addition to left main and left anterior descending coronary artery disease. Assessment for potential risk factor modification, dietary therapy or pharmacologic therapy may be warranted, if clinically indicated. 4. Severe hepatic steatosis. Aortic Atherosclerosis (ICD10-I70.0). Electronically Signed   By: Vinnie Langton M.D.   On: 11/17/2017 18:13   Ct Abdomen Pelvis W Contrast  Result Date: 11/17/2017 CLINICAL DATA:  Mid to lower abdominal pain for 2 or 3 weeks. EXAM: CT ABDOMEN AND PELVIS WITH CONTRAST TECHNIQUE: Multidetector CT imaging of the abdomen and pelvis was performed using the standard protocol following bolus administration of intravenous contrast. CONTRAST:  161mL ISOVUE-300 IOPAMIDOL (ISOVUE-300) INJECTION 61% COMPARISON:  December 09, 2014 FINDINGS: Lower chest: No acute abnormality. Hepatobiliary: Hepatic steatosis. Previous cholecystectomy. The portal vein is patent. The liver is otherwise normal. Pancreas: There is fat stranding near the head of the pancreas. The remainder of the pancreas is normal. Spleen: Normal in size without focal abnormality. Adrenals/Urinary Tract: Adrenal glands are unremarkable. Kidneys are normal, without renal  calculi, focal lesion, or hydronephrosis. Bladder is unremarkable. Stomach/Bowel: The stomach is normal. There is inflammation of the  second portion of the duodenum. There are duodenal diverticuli in this region. The inflammation is thought to be secondary to pancreatitis rather than from the duodenum given an elevated lipase. The small bowel is otherwise normal. Colonic diverticulosis without diverticulitis. The appendix is not visualized. Vascular/Lymphatic: Mild atherosclerotic change in the abdominal aorta. No aneurysm or dissection. Reproductive: Status post hysterectomy. No adnexal masses. Other: No abdominal wall hernia or abnormality. No abdominopelvic ascites. Musculoskeletal: No acute or significant osseous findings. IMPRESSION: 1. Fat stranding adjacent to the pancreas and adjacent duodenum/duodenal diverticuli is thought to be secondary to pancreatitis given the patient's elevated lipase. 2. Colonic diverticulosis without diverticulitis. 3. Atherosclerotic change in the abdominal aorta. Electronically Signed   By: Dorise Bullion III M.D   On: 11/17/2017 17:48   Dg Abdomen Acute W/chest  Result Date: 11/17/2017 CLINICAL DATA:  Vomiting, shortness of breath. EXAM: DG ABDOMEN ACUTE W/ 1V CHEST COMPARISON:  None. FINDINGS: Dilated small bowel loops are noted in left upper quadrant. No colonic dilatation is noted. No radiopaque calculi or other significant radiographic abnormality is seen. Heart size and mediastinal contours are within normal limits. Both lungs are clear. IMPRESSION: Dilated small bowel loops seen in left upper quadrant of abdomen concerning for distal small bowel obstruction or focal ileus; follow-up radiographs are recommended. No acute cardiopulmonary disease. Electronically Signed   By: Marijo Conception, M.D.   On: 11/17/2017 15:41   Assessment/Plan Active Problems:   Pancreatitis  Acute pancreatitis, unknown etiology - Aggressive IV hydration with LR at 150 cc/hr - Can trial  liquid diet per pt request - Antiemetics PRN - Can consider RUQ U/S in AM although doubt biliary obstruction given only mild lipase elevation and lack of LFT abnormalities or findings on CT a/p - Check triglycerides - Pain control with PRN oxycodone  Suicide attempt - Will need Psych consult in AM - Sitter at bedside, suicide precautions - Resume home Wellbutrin - Consider MR brain to assess for organic cause of symptoms - Check TSH, B12  Unspecified neuropathy, essential tremor - Continue gabapentin, propranolol  Undocumented COPD - Wean oxygen for goal SpO2 >87% - Resume home Dulera - Albuterol nebs PRN  H/o DVT x3 - Continue home Eliquis  T2DM - SSI, FSBS Q4H  Hypothyroidism - Check TSH - Continue home Synthroid  DVT prophylaxis: Lovenox Code Status: Full Disposition Plan: Pending Psych eval Consults called: None Admission status: Inpatient   Bennie Pierini MD [facility]  If 7PM-7AM, please contact night-coverage [email or web address] Password [email or web address]  11/17/2017, 10:03 PM

## 2017-11-17 NOTE — Progress Notes (Signed)
Received handoff report from ER regarding patient being admitted to this unit. While reviewing triage note, it was noted that patient's husband had indicated that patient had attempted to commit suicide over the weekend and had also told staff during triage."she does not want to live and had planned to kill herself." ER nurse informed this writer that "patient's main focus is medical at this time". House Supervisor made aware that patient had made the statement above, and this Probation officer was requesting that a sitter be available for this patient on the unit.

## 2017-11-17 NOTE — ED Notes (Signed)
Patient transported to CT 

## 2017-11-18 ENCOUNTER — Other Ambulatory Visit: Payer: Self-pay

## 2017-11-18 DIAGNOSIS — E669 Obesity, unspecified: Secondary | ICD-10-CM

## 2017-11-18 DIAGNOSIS — Z87891 Personal history of nicotine dependence: Secondary | ICD-10-CM

## 2017-11-18 DIAGNOSIS — Z6379 Other stressful life events affecting family and household: Secondary | ICD-10-CM

## 2017-11-18 DIAGNOSIS — F419 Anxiety disorder, unspecified: Secondary | ICD-10-CM

## 2017-11-18 DIAGNOSIS — E119 Type 2 diabetes mellitus without complications: Secondary | ICD-10-CM

## 2017-11-18 DIAGNOSIS — Z818 Family history of other mental and behavioral disorders: Secondary | ICD-10-CM

## 2017-11-18 DIAGNOSIS — F322 Major depressive disorder, single episode, severe without psychotic features: Secondary | ICD-10-CM

## 2017-11-18 DIAGNOSIS — Z811 Family history of alcohol abuse and dependence: Secondary | ICD-10-CM

## 2017-11-18 DIAGNOSIS — G4700 Insomnia, unspecified: Secondary | ICD-10-CM

## 2017-11-18 DIAGNOSIS — T391X2A Poisoning by 4-Aminophenol derivatives, intentional self-harm, initial encounter: Secondary | ICD-10-CM

## 2017-11-18 DIAGNOSIS — Z598 Other problems related to housing and economic circumstances: Secondary | ICD-10-CM

## 2017-11-18 DIAGNOSIS — R450 Nervousness: Secondary | ICD-10-CM

## 2017-11-18 LAB — BASIC METABOLIC PANEL
Anion gap: 8 (ref 5–15)
BUN: 6 mg/dL (ref 6–20)
CO2: 30 mmol/L (ref 22–32)
Calcium: 8.6 mg/dL — ABNORMAL LOW (ref 8.9–10.3)
Chloride: 103 mmol/L (ref 101–111)
Creatinine, Ser: 0.67 mg/dL (ref 0.44–1.00)
GFR calc Af Amer: >60 mL/min (ref >60)
GFR calc non Af Amer: >60 mL/min (ref >60)
Glucose, Bld: 164 mg/dL — ABNORMAL HIGH (ref 65–99)
Potassium: 3.8 mmol/L (ref 3.5–5.1)
Sodium: 141 mmol/L (ref 135–145)

## 2017-11-18 LAB — URINE CULTURE
Culture: NO GROWTH
Special Requests: NORMAL

## 2017-11-18 LAB — CBC
HCT: 45.9 % (ref 36.0–46.0)
Hemoglobin: 14.7 g/dL (ref 12.0–15.0)
MCH: 29.3 pg (ref 26.0–34.0)
MCHC: 32 g/dL (ref 30.0–36.0)
MCV: 91.4 fL (ref 78.0–100.0)
Platelets: 210 10*3/uL (ref 150–400)
RBC: 5.02 MIL/uL (ref 3.87–5.11)
RDW: 13.9 % (ref 11.5–15.5)
WBC: 8.6 10*3/uL (ref 4.0–10.5)

## 2017-11-18 LAB — GLUCOSE, CAPILLARY
Glucose-Capillary: 143 mg/dL — ABNORMAL HIGH (ref 65–99)
Glucose-Capillary: 156 mg/dL — ABNORMAL HIGH (ref 65–99)
Glucose-Capillary: 163 mg/dL — ABNORMAL HIGH (ref 65–99)
Glucose-Capillary: 167 mg/dL — ABNORMAL HIGH (ref 65–99)
Glucose-Capillary: 188 mg/dL — ABNORMAL HIGH (ref 65–99)

## 2017-11-18 LAB — VITAMIN B12: Vitamin B-12: 876 pg/mL (ref 180–914)

## 2017-11-18 MED ORDER — GI COCKTAIL ~~LOC~~
30.0000 mL | Freq: Once | ORAL | Status: AC
  Administered 2017-11-18: 30 mL via ORAL
  Filled 2017-11-18: qty 30

## 2017-11-18 MED ORDER — METOCLOPRAMIDE HCL 5 MG/ML IJ SOLN
5.0000 mg | Freq: Once | INTRAMUSCULAR | Status: AC
  Administered 2017-11-18: 5 mg via INTRAVENOUS
  Filled 2017-11-18: qty 2

## 2017-11-18 MED ORDER — KETOROLAC TROMETHAMINE 15 MG/ML IJ SOLN
7.5000 mg | Freq: Once | INTRAMUSCULAR | Status: AC
  Administered 2017-11-18: 7.5 mg via INTRAVENOUS
  Filled 2017-11-18: qty 1

## 2017-11-18 MED ORDER — DIPHENHYDRAMINE HCL 50 MG/ML IJ SOLN
12.5000 mg | Freq: Once | INTRAMUSCULAR | Status: AC
  Administered 2017-11-18: 12.5 mg via INTRAVENOUS
  Filled 2017-11-18: qty 1

## 2017-11-18 MED ORDER — LIP MEDEX EX OINT
TOPICAL_OINTMENT | CUTANEOUS | Status: AC
  Filled 2017-11-18: qty 7

## 2017-11-18 NOTE — Progress Notes (Signed)
CM consult for suicidal ideations, CSW to see patient. Will f/u if home support is needed after seen by CSW. [phone]

## 2017-11-18 NOTE — Progress Notes (Signed)
Called Dr. [place] Crane concerning low BP of 109/65, pulse 80 and order was given to hold morning dose of Inderal.

## 2017-11-18 NOTE — Consult Note (Signed)
[facility] Face-to-Face Psychiatry Consult   Reason for Consult:  Suicide attempt  Referring Physician:  Dr. Nevada Crane Patient Identification: Hayley [name] MRN:  [id number] Principal Diagnosis: MDD (major depressive episode), single episode, severe, no psychosis ([place]) Diagnosis:   Patient Active Problem List   Diagnosis Date Noted  . Pancreatitis [K85.90] 11/17/2017  . Writer's cramp [F48.8] 01/02/2017  . Neck pain on left side [M54.2] 09/26/2016  . Tremor [R25.1] 06/20/2016  . Obesity, Class II, BMI 35-39.9, isolated (see actual BMI) [E66.9] 04/09/2016  . UTI (lower urinary tract infection) [N39.0]   . Paresthesia of both lower extremities [R20.2] 03/22/2016  . Vertigo [R42] 03/22/2016  . Essential tremor [G25.0] 03/12/2016  . Fall [W19.XXXA] 03/12/2016  . Low back pain [M54.5] 03/12/2016  . Coagulopathy ([place]) [D68.9] 07/20/2015  . Diabetes mellitus without complication ([place]) [R91.6] 06/03/2015  . Essential hypertension [I10] 06/03/2015  . OA (osteoarthritis) of knee [M17.10] 12/06/2014  . Hypothyroidism [E03.9] 03/03/2014  . Asthma [J45.909] 03/03/2014  . Acute blood loss anemia [D62] 03/03/2014  . Left knee pain [M25.562] 03/03/2014  . Failed total knee arthroplasty ([place]) [B84.665L, Z96.659] 02/24/2014  . OSA on CPAP [G47.33, Z99.89] 12/23/2013  . History of pulmonary embolus (PE) [Z86.711] 12/21/2013  . 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 Obesity, morbid ([place]) [E66.01] 04/27/2010  . ANEMIA [D64.9] 04/27/2010  . ANXIETY DEPRESSION [F34.1] 04/27/2010  . Chronic migraine [G43.709] 04/27/2010  . NEUROPATHY [G58.9] 04/27/2010  . HEMORRHOIDS [K64.9] 04/27/2010  . GERD [K21.9] 04/27/2010  . DIVERTICULOSIS, COLON [K57.30] 04/27/2010  . IBS [K58.9] 04/27/2010  . CHOLELITHIASIS [K80.20] 04/27/2010  . RENAL CALCULUS [N20.0] 04/27/2010    Total Time spent with patient: 1 hour  Subjective:   Hayley [name] is a 72 y.o. female patient admitted with suicide attempt by Percocet  overdose.  HPI:   Per chart review, patient attempted suicide on 3/1. She overdosed on 30 pills of Percocet. She was brought to the hospital by her daughter and son-in-law. Stressors include poor health for both herself and her husband and financial difficulties. She continues to endorse SI. She discontinued her home medications in the past few weeks. She was restarted on home Wellbutrin 300 mg daily. Daughter is concerned for hallucinations as she reported having bedbugs in her room but her daughter did not find evidence of this. She is currently receiving treatment for acute pancreatitis with IV hydration. PMP indicates that patient last filled a prescription for Klonopin 0.5 mg daily on 1/24 by Dr. Marcial Pacas and Percocet on 1/2 (#60). UDS was negative.   On interview, Hayley [name] reports that she has been depressed and hopeless for the past 3 months in the setting of stressors. She wanted to "eliminate the problem" so she decided to commit suicide. She reports SI for 2 weeks with a plan to overdose. She decided to put her affairs in order prior to committing suicide. She reports several stressors. She is on a fixed income. Her husband's health has declined over the past year since having a stroke. She reports overdosing on her Percocet that she takes for pain on Friday. She is unsure how many she took. Her husband did not find her altered until Saturday since they sleep in separate bedrooms. She reports that she is disappointed that her attempt was not successful. She reports feelings of isolation and anxiety/worry. She endorses fluctuations in her sleep and appetite. She denies weight changes. She reports visual hallucinations a month ago. She saw "lights" and "eyes" when she woke up in  the middle of the night while she was in the kitchen. She also reports seeing bedbugs although her family was not able to see them. She denies AH. She denies a history of symptoms consistent with mania (decreased need for  sleep, increased energy or pressured speech). She discontinued Wellbutrin 300 mg daily and Cymbalta 60 mg daily a month ago. She does not feel like her mood worsened after discontinuing these medications.    Past Psychiatric History: Depression   Risk to Self: Is patient at risk for suicide?: Yes Risk to Others:  None Prior Inpatient Therapy:  Denies  Prior Outpatient Therapy:  Her medications are managed by her PCP, Dr. [name].   Past Medical History:  Past Medical History:  Diagnosis Date  . Anemia   . Anxiety   . Bronchitis    hx of   . Clotting disorder ([place]) 02-17-14   S/p appendectomy-developed Pulmonary emboli-tx. warfarin-d/c 1 yr ago.  . Colon polyps   . Depression   . Diabetes mellitus without complication ([place])    "borderline"- oral med  . Disorder of vocal cords    spasmotic dysphonia ,02-17-14 has" whispery voice-low tone"  . Hyperthyroidism   . Kidney stones   . Migraines   . Nausea Nov 15, 2014   cycle of migraine headaches and nausea  . Neuromuscular disorder ([place])    hands and throat"spasmodic dysphonia", tremors - thimb and forefinger   . Obesity, Class III, BMI 40-49.9 (morbid obesity) ([place]) 04/27/2010   02-17-14 reports some weight loss- intentional  . Osteoarthritis    in back(chronic pain)  . Pelvic pain    02-17-14 "states thinks its back related on left groin"  . Pulmonary emboli [facility])    s/p Appendectomy '13 [facility]  . Reflux   . Right knee pain   . Sleep apnea    cpap - settings at 4   . Toxic goiter     Past Surgical History:  Procedure Laterality Date  . ABDOMINAL HYSTERECTOMY    . APPENDECTOMY     3'13 [facility] s/p developed  Pulmonary Emboli  . BOTOX INJECTION     for migranes  . CATARACT EXTRACTION, BILATERAL Bilateral 03/2013   . CHOLECYSTECTOMY    . HAND SURGERY  2004   both hands  . KIDNEY STONE SURGERY    . KNEE ARTHROSCOPY  1998   right  . LAPAROSCOPIC APPENDECTOMY  11/27/2011   Procedure: APPENDECTOMY LAPAROSCOPIC;  Surgeon: Adin Hector, MD;  Location: [facility];  Service: General;  Laterality: N/A;  . SPINAL CORD STIMULATOR IMPLANT  06/04/2016   Dr[facility] Alfonse Ras at [facility]  . TOTAL KNEE ARTHROPLASTY Right 12/06/2014   Procedure: RIGHT TOTAL KNEE ARTHROPLASTY;  Surgeon: Gaynelle Arabian, MD;  Location: [facility];  Service: Orthopedics;  Laterality: Right;  . TOTAL KNEE REVISION Left 02/24/2014   Procedure: LEFT TOTAL KNEE REVISION;  Surgeon: Gearlean Alf, MD;  Location: [facility];  Service: Orthopedics;  Laterality: Left;  . vocal cord surgery  02-17-14   01-12-14 done in [place] [facility](s/p selective denervation/reinnervation recurrent laryngeal nerve surgery)  . zenkers diverticulum     Family History:  Family History  Problem Relation Age of Onset  . Heart disease Mother   . Pneumonia Father   . Cirrhosis Father   . Hypertension Father   . Alcoholism Father   . Heart attack Paternal Grandfather   . Heart attack Maternal Grandfather   . Heart attack Paternal Grandmother   .  Heart attack Maternal Grandmother    Family Psychiatric  History: Father-alcoholism, daughter-BPAD type 1, son-BPAD type 2 and niece-depression.   Social History:  Social History   Substance and Sexual Activity  Alcohol Use No     Social History   Substance and Sexual Activity  Drug Use No    Social History   Socioeconomic History  . Marital status: Married    Spouse name: None  . Number of children: 5  . Years of education: 31  . Highest education level: None  Social Needs  . Financial resource strain: None  . Food insecurity - worry: None  . Food insecurity - inability: None  . Transportation needs - medical: None  . Transportation needs - non-medical: None  Occupational History  . Occupation: Retired  Tobacco Use  . Smoking status: Former Smoker    Last attempt to quit: 01/03/1982    Years since quitting: 35.8  . Smokeless tobacco: Never Used  Substance and Sexual Activity  . Alcohol use: No  . Drug use: No   . Sexual activity: No  Other Topics Concern  . None  Social History Narrative   Lives at home with husband.   Right-handed.   2 cups caffeine per day.   Additional Social History: She lives at home with her husband of 45 years. This is her 3rd marriage. She has 2 grown children. 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 She receives SSDI. She denies illicit substance or alcohol use.     Allergies:   Allergies  Allergen Reactions  . Bee Venom Anaphylaxis  . Tape Rash    Rash with paper tape  . Etodolac Diarrhea and Nausea And Vomiting  . Morphine Other (See Comments)    Hallucinations  . Other Itching and Swelling  . Poison Ivy Extract  [Poison Ivy Extract] Itching and Swelling  . Propoxyphene Nausea And Vomiting  . Propoxyphene N-Acetaminophen Nausea And Vomiting  . Penicillins Rash    Has patient had a PCN reaction causing immediate rash, facial/tongue/throat swelling, SOB or lightheadedness with hypotension: Yes Has patient had a PCN reaction causing severe rash involving mucus membranes or skin necrosis: No Has patient had a PCN reaction that required hospitalization No Has patient had a PCN reaction occurring within the last 10 years: No If all of the above answers are "NO", then may proceed with Cephalosporin use.     Labs:  Results for orders placed or performed during the hospital encounter of 11/17/17 (from the past 48 hour(s))  Comprehensive metabolic panel     Status: Abnormal   Collection Time: 11/17/17  1:40 PM  Result Value Ref Range   Sodium 137 135 - 145 mmol/L   Potassium 3.3 (L) 3.5 - 5.1 mmol/L   Chloride 99 (L) 101 - 111 mmol/L   CO2 29 22 - 32 mmol/L   Glucose, Bld 207 (H) 65 - 99 mg/dL   BUN 7 6 - 20 mg/dL   Creatinine, Ser 0.67 0.44 - 1.00 mg/dL   Calcium 8.8 (L) 8.9 - 10.3 mg/dL   Total Protein 7.1 6.5 - 8.1 g/dL   Albumin 3.6 3.5 - 5.0 g/dL   AST 28 15 - 41 U/L   ALT 18 14 - 54 U/L   Alkaline Phosphatase 61 38 - 126 U/L    Total Bilirubin 0.7 0.3 - 1.2 mg/dL   GFR calc non Af Amer >60 >60 mL/min   GFR calc Af Amer >60 >60 mL/min  Comment: (NOTE) The eGFR has been calculated using the CKD EPI equation. This calculation has not been validated in all clinical situations. eGFR's persistently <60 mL/min signify possible Chronic Kidney Disease.    Anion gap 9 5 - 15    Comment: Performed at [facility], [place] [address] [place], [place] [address]  CBC     Status: None   Collection Time: 11/17/17  1:40 PM  Result Value Ref Range   WBC 10.0 4.0 - 10.5 K/uL   RBC 4.92 3.87 - 5.11 MIL/uL   Hemoglobin 14.9 12.0 - 15.0 g/dL   HCT 45.6 36.0 - 46.0 %   MCV 92.7 78.0 - 100.0 fL   MCH 30.3 26.0 - 34.0 pg   MCHC 32.7 30.0 - 36.0 g/dL   RDW 13.8 11.5 - 15.5 %   Platelets 247 150 - 400 K/uL    Comment: Performed at [facility], [place] [address], [place], [place] [address]  Protime-INR - (order if patient is taking Coumadin / Warfarin)     Status: None   Collection Time: 11/17/17  1:40 PM  Result Value Ref Range   Prothrombin Time 13.6 11.4 - 15.2 seconds   INR 1.05     Comment: Performed at [facility], [place] [address], [place], [place] [address]  Lipase, blood     Status: Abnormal   Collection Time: 11/17/17  1:40 PM  Result Value Ref Range   Lipase 82 (H) 11 - 51 U/L    Comment: Performed at [facility], [place] [address] [place], [place] [address]  CBG monitoring, ED     Status: Abnormal   Collection Time: 11/17/17  1:52 PM  Result Value Ref Range   Glucose-Capillary 195 (H) 65 - 99 mg/dL  I-Stat Troponin, ED (not at [facility])     Status: None   Collection Time: 11/17/17  2:15 PM  Result Value Ref Range   Troponin i, poc 0.00 0.00 - 0.08 ng/mL   Comment 3            Comment: Due to the release kinetics of cTnI, a negative result within the first hours of the onset of symptoms does not rule out myocardial infarction with certainty. If  myocardial infarction is still suspected, repeat the test at appropriate intervals.   Urinalysis, Routine w reflex microscopic     Status: Abnormal   Collection Time: 11/17/17  2:16 PM  Result Value Ref Range   Color, Urine YELLOW YELLOW   APPearance CLEAR CLEAR   Specific Gravity, Urine 1.003 (L) 1.005 - 1.030   pH 7.0 5.0 - 8.0   Glucose, UA NEGATIVE NEGATIVE mg/dL   Hgb urine dipstick NEGATIVE NEGATIVE   Bilirubin Urine NEGATIVE NEGATIVE   Ketones, ur NEGATIVE NEGATIVE mg/dL   Protein, ur NEGATIVE NEGATIVE mg/dL   Nitrite NEGATIVE NEGATIVE   Leukocytes, UA NEGATIVE NEGATIVE    Comment: Performed at [place] [address], [place], [place] [address]  I-Stat CG4 Lactic Acid, ED     Status: None   Collection Time: 11/17/17  2:17 PM  Result Value Ref Range   Lactic Acid, Venous 1.56 0.5 - 1.9 mmol/L  Urine rapid drug screen (hosp performed)     Status: None   Collection Time: 11/17/17  2:20 PM  Result Value Ref Range   Opiates NONE DETECTED NONE DETECTED   Cocaine NONE DETECTED NONE DETECTED   Benzodiazepines NONE DETECTED NONE DETECTED  Amphetamines NONE DETECTED NONE DETECTED   Tetrahydrocannabinol NONE DETECTED NONE DETECTED   Barbiturates NONE DETECTED NONE DETECTED    Comment: (NOTE) DRUG SCREEN FOR MEDICAL PURPOSES ONLY.  IF CONFIRMATION IS NEEDED FOR ANY PURPOSE, NOTIFY LAB WITHIN 5 DAYS. LOWEST DETECTABLE LIMITS FOR URINE DRUG SCREEN Drug Class                     Cutoff (ng/mL) Amphetamine and metabolites    1000 Barbiturate and metabolites    200 Benzodiazepine                 932 Tricyclics and metabolites     300 Opiates and metabolites        300 Cocaine and metabolites        300 THC                            50 Performed at [facility], [place] [address], [place], [place] [address]   Acetaminophen level     Status: Abnormal   Collection Time: 11/17/17  2:21 PM  Result Value Ref Range   Acetaminophen  (Tylenol), Serum <10 (L) 10 - 30 ug/mL    Comment:        THERAPEUTIC CONCENTRATIONS VARY SIGNIFICANTLY. A RANGE OF 10-30 ug/mL MAY BE AN EFFECTIVE CONCENTRATION FOR MANY PATIENTS. HOWEVER, SOME ARE BEST TREATED AT CONCENTRATIONS OUTSIDE THIS RANGE. ACETAMINOPHEN CONCENTRATIONS >150 ug/mL AT 4 HOURS AFTER INGESTION AND >50 ug/mL AT 12 HOURS AFTER INGESTION ARE OFTEN ASSOCIATED WITH TOXIC REACTIONS. Performed at [facility], [place] [address], [place], [place] [address]   Salicylate level     Status: None   Collection Time: 11/17/17  2:21 PM  Result Value Ref Range   Salicylate Lvl <4.2 2.8 - 30.0 mg/dL    Comment: Performed at [facility], [place] [address] [place], [place] [address]  Influenza panel by PCR (type A & B)     Status: None   Collection Time: 11/17/17  4:37 PM  Result Value Ref Range   Influenza A By PCR NEGATIVE NEGATIVE   Influenza B By PCR NEGATIVE NEGATIVE    Comment: (NOTE) The Xpert Xpress Flu assay is intended as an aid in the diagnosis of  influenza and should not be used as a sole basis for treatment.  This  assay is FDA approved for nasopharyngeal swab specimens only. Nasal  washings and aspirates are unacceptable for Xpert Xpress Flu testing. Performed at [facility], [place] [address], [place], [place] [address]   TSH     Status: None   Collection Time: 11/17/17  8:50 PM  Result Value Ref Range   TSH 1.348 0.350 - 4.500 uIU/mL    Comment: Performed by a 3rd Generation assay with a functional sensitivity of <=0.01 uIU/mL. Performed at [facility], [place] [address], [place], [place] [address]   Vitamin B12     Status: None   Collection Time: 11/17/17 10:13 PM  Result Value Ref Range   Vitamin B-12 876 180 - 914 pg/mL    Comment: (NOTE) This assay is not validated for testing neonatal or myeloproliferative syndrome specimens for Vitamin B12 levels. Performed at [facility], [place] [address], [place], [place] [address]   Magnesium     Status: None   Collection Time: 11/17/17 10:13 PM  Result Value Ref Range   Magnesium 1.7  1.7 - 2.4 mg/dL    Comment: Performed at [facility], [place] [address], [place], [place] [address]  Phosphorus     Status: Abnormal   Collection Time: 11/17/17 10:13 PM  Result Value Ref Range   Phosphorus 2.3 (L) 2.5 - 4.6 mg/dL    Comment: Performed at [facility], [place] [address], [place], [place] [address]  Brain natriuretic peptide     Status: None   Collection Time: 11/17/17 10:13 PM  Result Value Ref Range   B Natriuretic Peptide 64.1 0.0 - 100.0 pg/mL    Comment: Performed at [facility], [place] [address], [place], [place] [address]  Triglycerides     Status: None   Collection Time: 11/17/17 10:13 PM  Result Value Ref Range   Triglycerides 122 <150 mg/dL    Comment: Performed at [facility], [place] [address], [place], [place] [address]  Basic metabolic panel     Status: Abnormal   Collection Time: 11/18/17  5:08 AM  Result Value Ref Range   Sodium 141 135 - 145 mmol/L   Potassium 3.8 3.5 - 5.1 mmol/L   Chloride 103 101 - 111 mmol/L   CO2 30 22 - 32 mmol/L   Glucose, Bld 164 (H) 65 - 99 mg/dL   BUN 6 6 - 20 mg/dL   Creatinine, Ser 0.67 0.44 - 1.00 mg/dL   Calcium 8.6 (L) 8.9 - 10.3 mg/dL   GFR calc non Af Amer >60 >60 mL/min   GFR calc Af Amer >60 >60 mL/min    Comment: (NOTE) The eGFR has been calculated using the CKD EPI equation. This calculation has not been validated in all clinical situations. eGFR's persistently <60 mL/min signify possible Chronic Kidney Disease.    Anion gap 8 5 - 15    Comment: Performed at [facility], [place] [address] [place], [place] [address]  CBC     Status: None   Collection Time: 11/18/17  5:08 AM  Result Value Ref Range   WBC 8.6 4.0 - 10.5 K/uL   RBC 5.02 3.87 - 5.11 MIL/uL    Hemoglobin 14.7 12.0 - 15.0 g/dL   HCT 45.9 36.0 - 46.0 %   MCV 91.4 78.0 - 100.0 fL   MCH 29.3 26.0 - 34.0 pg   MCHC 32.0 30.0 - 36.0 g/dL   RDW 13.9 11.5 - 15.5 %   Platelets 210 150 - 400 K/uL    Comment: Performed at [facility], [place] [address] [place],  [address]  Glucose, capillary     Status: Abnormal   Collection Time: 11/18/17  7:26 AM  Result Value Ref Range   Glucose-Capillary 163 (H) 65 - 99 mg/dL  Glucose, capillary     Status: Abnormal   Collection Time: 11/18/17 12:01 PM  Result Value Ref Range   Glucose-Capillary 156 (H) 65 - 99 mg/dL    Current Facility-Administered Medications  Medication Dose Route Frequency Provider Last Rate Last Dose  . acetaminophen (TYLENOL) tablet 650 mg  650 mg Oral Q6H PRN Bennie Pierini, MD       Or  . acetaminophen (TYLENOL) suppository 650 mg  650 mg Rectal Q6H PRN Bennie Pierini, MD      . albuterol (PROVENTIL) (2.5 MG/3ML) 0.083% nebulizer solution 2.5 mg  2.5 mg Nebulization Q6H PRN Bennie Pierini, MD      . apixaban Arne Cleveland) tablet 2.5 mg  2.5 mg Oral BID [place],  Michele Mcalpine, MD   2.5 mg at 11/18/17 1011  . buPROPion (WELLBUTRIN XL) 24 hr tablet 300 mg  300 mg Oral Daily Bennie Pierini, MD   300 mg at 11/18/17 1010  . docusate sodium (COLACE) capsule 100 mg  100 mg Oral BID Bennie Pierini, MD   100 mg at 11/18/17 1010  . gabapentin (NEURONTIN) capsule 600 mg  600 mg Oral TID Bennie Pierini, MD   600 mg at 11/18/17 1010  . insulin aspart (novoLOG) injection 0-15 Units  0-15 Units Subcutaneous Q4H Bennie Pierini, MD   3 Units at 11/18/17 1223  . insulin glargine (LANTUS) injection 10 Units  10 Units Subcutaneous QHS Bennie Pierini, MD      . lactated ringers infusion   Intravenous Continuous Bennie Pierini, MD 150 mL/hr at 11/18/17 1417    . levothyroxine (SYNTHROID, LEVOTHROID) tablet 25 mcg  25 mcg Oral QAC breakfast Bennie Pierini, MD   25 mcg at 11/18/17 0749  . lip  balm (CARMEX) ointment           . mometasone-formoterol (DULERA) 100-5 MCG/ACT inhaler 2 puff  2 puff Inhalation BID Bennie Pierini, MD      . ondansetron [facility]) tablet 4 mg  4 mg Oral Q6H PRN Bennie Pierini, MD       Or  . ondansetron [facility]) injection 4 mg  4 mg Intravenous Q6H PRN Bennie Pierini, MD   4 mg at 11/18/17 1416  . oxyCODONE (Oxy IR/ROXICODONE) immediate release tablet 5 mg  5 mg Oral Q4H PRN Bennie Pierini, MD   5 mg at 11/18/17 1010  . polyethylene glycol (MIRALAX / GLYCOLAX) packet 17 g  17 g Oral Daily PRN Bennie Pierini, MD      . propranolol (INDERAL) tablet 60 mg  60 mg Oral BID Bennie Pierini, MD   Stopped at 11/18/17 1019  . senna (SENOKOT) tablet 8.6 mg  1 tablet Oral BID Bennie Pierini, MD   8.6 mg at 11/18/17 1010    Musculoskeletal: Strength & Muscle Tone: within normal limits Gait & Station: UTA since patient was lying in bed. Patient leans: N/A  Psychiatric Specialty Exam: Physical Exam  Nursing note and vitals reviewed. Constitutional: She is oriented to person, place, and time. She appears well-developed and well-nourished.  HENT:  Head: Normocephalic and atraumatic.  Neck: Normal range of motion.  Respiratory: Effort normal.  Musculoskeletal: Normal range of motion.  Neurological: She is alert and oriented to person, place, and time.  Skin: No rash noted.  Psychiatric: Her speech is normal and behavior is normal. Thought content normal. Cognition and memory are normal. She expresses impulsivity. She exhibits a depressed mood.    Review of Systems  Constitutional: Negative for chills and fever.  Cardiovascular: Negative for chest pain.  Gastrointestinal: Negative for abdominal pain, constipation, diarrhea, nausea and vomiting.  Musculoskeletal: Positive for back pain.  Psychiatric/Behavioral: Positive for depression and suicidal ideas. Negative for hallucinations and substance abuse. The patient is nervous/anxious and  has insomnia.   All other systems reviewed and are negative.   Blood pressure (!) 105/56, pulse 82, temperature 99.2 F (37.3 C), temperature source Oral, resp. rate 18, height _0  (1.676 m), weight 106.3 kg (234 lb 5.6 oz), last menstrual period 09/18/1979, SpO2 90 %.Body mass index is 37.82 kg/m.  General Appearance: Well Groomed, elderly, Caucasian female with short, gray, shaved hair, wearing a hospital gown and [place]  while lying in bed. NAD.   Eye Contact:  Good  Speech:  Clear and Coherent and Normal Rate  Volume:  Normal  Mood:  Depressed  Affect:  Congruent  Thought Process:  Goal Directed, Linear and Descriptions of Associations: Intact  Orientation:  Full (Time, Place, and Person)  Thought Content:  Logical  Suicidal Thoughts:  Yes.  with intent/plan  Homicidal Thoughts:  No  Memory:  Immediate;   Good Recent;   Good Remote;   Good  Judgement:  Fair  Insight:  Fair  Psychomotor Activity:  Normal  Concentration:  Concentration: Good and Attention Span: Good  Recall:  Good  Fund of Knowledge:  Good  Language:  Good  Akathisia:  No  Handed:  Right  AIMS (if indicated):   N/A  Assets:  Communication Skills Desire for Improvement Housing Social Support  ADL's:  Intact  Cognition:  WNL  Sleep:   Fair   Assessment:  Hayley [name] is a 72 y.o. female who was admitted for suicide attempt by overdose on Percocet. She continues to endorse SI with feelings of hopelessness, isolation and fluctuations in sleep and appetite. She warrants inpatient psychiatric hospitalization for stabilization and management given the severity of her suicide attempt and ongoing SI.   Treatment Plan Summary: -Continue home Wellbutrin 300 mg daily for depression. -Patient declines restarting Cymbalta since ineffective for mood. -Patient warrants inpatient psychiatric hospitalization given high risk of harm to self. -Continue bedside sitter.  -Please pursue involuntary commitment if patient refuses  voluntary psychiatric hospitalization or attempts to leave the hospital.  -Will sign off on patient at this time. Please consult psychiatry again as needed.    Disposition: Recommend psychiatric Inpatient admission when medically cleared.  Faythe Dingwall, DO 11/18/2017 2:54 PM

## 2017-11-18 NOTE — Progress Notes (Signed)
PROGRESS NOTE  TELIAH BUFFALO RXV:[id number] DOB: [birth year] DOA: 11/17/2017 PCP: [facility]  HPI/Recap of past 24 hours: Hayley [name] is a 72 yo female with PMH significant for depression, DM2, COPD, CAD, hypothyroidism, essential tremor who presented to the ED after attempting suicide by ingesting 30 pills of percocet. Tachycardic on presentation. CT abd pelvis revealed suspicion for acute pancreatitis. Lipase 82. Acetaminophen level <10.  11/18/17: continues to have suicidal ideation and feel hopeless, helpless. psychiatry consulted and recommended psych inpatient admission. Reports persistent nausea with no vomiting and moderate abdominal discomfort when she eats.  Assessment/Plan: Active Problems:   Pancreatitis   Suicidal attempt -acetaminophen level <10 -ingestion of 30 pills of percocet -Psychiatry consulted -Psychiatry highly appreciated -recommends [facility] psych facility admission -social work consulted for placement  Mild Acute pancreatitis -clear liquid diet -Advance diet as tolerated -Iv zofran for nausea -avoid narcotic for pain management  Hx of DVT -continue eliquis  DM2 -continue ISS  Undocumented COPD -continue breathing treatments -continue O2 supp to maintain O2 sat 92% or greater  Polyneuropathy -continue gabapentin  Essential tremor -on propranolol -hold for hypotension  Morbid obesity -weight loss in outpatient when more stable  Hypothyroidism -continue synthroid   Code Status: full  Family Communication: daughter at bedside   Disposition Plan: to be admitted at [facility]   Consultants:  Psychiatry  Social worker  Procedures: None  Antimicrobials:  none  DVT prophylaxis:  eliquis   Objective: Vitals:   11/17/17 1930 11/17/17 2049 11/18/17 0605 11/18/17 0803  BP: 110/74 101/65 103/68   Pulse: (!) 108 (!) 108 74   Resp: 20 18 18    Temp:  99 F (37.2 C) 98.9 F (37.2 C)   TempSrc:   Oral     SpO2: 90% 92% 92%   Weight:    106.3 kg (234 lb 5.6 oz)  Height:    5\' 6"  (1.676 m)    Intake/Output Summary (Last 24 hours) at 11/18/2017 0853 Last data filed at 11/17/2017 1618 Gross per 24 hour  Intake 1000 ml  Output 600 ml  Net 400 ml   Filed Weights   11/18/17 0803  Weight: 106.3 kg (234 lb 5.6 oz)    Exam:   General:  72 yo CF obese NAD A&O x 3   Cardiovascular: RRR no rubs or gallops  Respiratory: CTA no wheezes or rales   Abdomen: obese NT ND NBS x4  Musculoskeletal: non focal   Skin: bruising right upper extremity  Psychiatry: mood is anxious   Data Reviewed: CBC: Recent Labs  Lab 11/17/17 1340 11/18/17 0508  WBC 10.0 8.6  HGB 14.9 14.7  HCT 45.6 45.9  MCV 92.7 91.4  PLT 247 509   Basic Metabolic Panel: Recent Labs  Lab 11/17/17 1340 11/17/17 2213 11/18/17 0508  NA 137  --  141  K 3.3*  --  3.8  CL 99*  --  103  CO2 29  --  30  GLUCOSE 207*  --  164*  BUN 7  --  6  CREATININE 0.67  --  0.67  CALCIUM 8.8*  --  8.6*  MG  --  1.7  --   PHOS  --  2.3*  --    GFR: Estimated Creatinine Clearance: 78.4 mL/min (by C-G formula based on SCr of 0.67 mg/dL). Liver Function Tests: Recent Labs  Lab 11/17/17 1340  AST 28  ALT 18  ALKPHOS 61  BILITOT 0.7  PROT 7.1  ALBUMIN 3.6   Recent Labs  Lab 11/17/17 1340  LIPASE 82*   No results for input(s): AMMONIA in the last 168 hours. Coagulation Profile: Recent Labs  Lab 11/17/17 1340  INR 1.05   Cardiac Enzymes: No results for input(s): CKTOTAL, CKMB, CKMBINDEX, TROPONINI in the last 168 hours. BNP (last 3 results) No results for input(s): PROBNP in the last 8760 hours. HbA1C: No results for input(s): HGBA1C in the last 72 hours. CBG: Recent Labs  Lab 11/17/17 1352 11/18/17 0726  GLUCAP 195* 163*   Lipid Profile: Recent Labs    11/17/17 2213  TRIG 122   Thyroid Function Tests: Recent Labs    11/17/17 2050  TSH 1.348   Anemia Panel: Recent Labs    11/17/17 2213   VITAMINB12 876   Urine analysis:    Component Value Date/Time   COLORURINE YELLOW 11/17/2017 1416   APPEARANCEUR CLEAR 11/17/2017 1416   APPEARANCEUR CLOUDY 12/19/2014 0730   LABSPEC 1.003 (L) 11/17/2017 1416   LABSPEC 1.025 12/19/2014 0730   PHURINE 7.0 11/17/2017 1416   GLUCOSEU NEGATIVE 11/17/2017 1416   GLUCOSEU see comment 12/19/2014 0730   HGBUR NEGATIVE 11/17/2017 1416   BILIRUBINUR NEGATIVE 11/17/2017 1416   BILIRUBINUR n 05/08/2016 1506   BILIRUBINUR see comment 12/19/2014 0730   KETONESUR NEGATIVE 11/17/2017 1416   PROTEINUR NEGATIVE 11/17/2017 1416   UROBILINOGEN negative 05/08/2016 1506   UROBILINOGEN 1.0 11/30/2014 1215   NITRITE NEGATIVE 11/17/2017 1416   LEUKOCYTESUR NEGATIVE 11/17/2017 1416   LEUKOCYTESUR see comment 12/19/2014 0730   Sepsis Labs: @LABRCNTIP (procalcitonin:4,lacticidven:4)  )No results found for this or any previous visit (from the past 240 hour(s)).    Studies: Ct Head Wo Contrast  Result Date: 11/17/2017 CLINICAL DATA:  Patient with nausea and vomiting. Declined mental status. Contrast was given prior to study or abdomen pelvis CT. EXAM: CT HEAD WITHOUT CONTRAST TECHNIQUE: Contiguous axial images were obtained from the base of the skull through the vertex without intravenous contrast. COMPARISON:  Brain CT 03/22/2016. FINDINGS: Brain: The ventricles and sulci are appropriate for patient's age. No evidence for acute cortically based infarct, intracranial hemorrhage, mass lesion or mass-effect. Vascular: Unremarkable. Skull: Intact. Sinuses/Orbits: Paranasal sinuses are well aerated. Mastoid air cells are unremarkable. Orbits are unremarkable. Other: None. IMPRESSION: No acute intracranial process. Electronically Signed   By: Lovey Newcomer M.D.   On: 11/17/2017 18:11   Ct Angio Chest Pe W And/or Wo Contrast  Result Date: 11/17/2017 CLINICAL DATA:  72 year old female with history of shortness of breath and chest pain. EXAM: CT ANGIOGRAPHY CHEST WITH  CONTRAST TECHNIQUE: Multidetector CT imaging of the chest was performed using the standard protocol during bolus administration of intravenous contrast. Multiplanar CT image reconstructions and MIPs were obtained to evaluate the vascular anatomy. CONTRAST:  60 mL ISOVUE-370 IOPAMIDOL (ISOVUE-370) INJECTION 76% COMPARISON:  Chest CT 08/02/2017. FINDINGS: Cardiovascular: There are no filling defects within the pulmonary arterial tree to suggest underlying pulmonary embolism. Heart size is normal. There is no significant pericardial fluid, thickening or pericardial calcification. There is aortic atherosclerosis, as well as atherosclerosis of the great vessels of the mediastinum and the coronary arteries, including calcified atherosclerotic plaque in the left main and left anterior descending coronary arteries. Mediastinum/Nodes: No pathologically enlarged mediastinal or hilar lymph nodes. Esophagus is unremarkable in appearance. No axillary lymphadenopathy. Lungs/Pleura: A few scattered areas of subsegmental atelectasis and/or scarring are noted in the lung bases bilaterally. No consolidative airspace disease. No pleural effusions. No suspicious appearing pulmonary nodules or masses  are noted. Upper Abdomen: Diffuse low attenuation throughout the visualized portions of the liver, indicative of severe hepatic steatosis. Status post cholecystectomy. Musculoskeletal: Bilateral breast implants incidentally noted with dense capsular calcifications on the right side. There are no aggressive appearing lytic or blastic lesions noted in the visualized portions of the skeleton. Spinal cord stimulator in the midthoracic region. Review of the MIP images confirms the above findings. IMPRESSION: 1. No evidence of pulmonary embolism. 2. No acute findings are noted in the thorax to account for the patient's symptoms. 3. Aortic atherosclerosis, in addition to left main and left anterior descending coronary artery disease. Assessment  for potential risk factor modification, dietary therapy or pharmacologic therapy may be warranted, if clinically indicated. 4. Severe hepatic steatosis. Aortic Atherosclerosis (ICD10-I70.0). Electronically Signed   By: Vinnie Langton M.D.   On: 11/17/2017 18:13   Ct Abdomen Pelvis W Contrast  Result Date: 11/17/2017 CLINICAL DATA:  Mid to lower abdominal pain for 2 or 3 weeks. EXAM: CT ABDOMEN AND PELVIS WITH CONTRAST TECHNIQUE: Multidetector CT imaging of the abdomen and pelvis was performed using the standard protocol following bolus administration of intravenous contrast. CONTRAST:  110mL ISOVUE-300 IOPAMIDOL (ISOVUE-300) INJECTION 61% COMPARISON:  December 09, 2014 FINDINGS: Lower chest: No acute abnormality. Hepatobiliary: Hepatic steatosis. Previous cholecystectomy. The portal vein is patent. The liver is otherwise normal. Pancreas: There is fat stranding near the head of the pancreas. The remainder of the pancreas is normal. Spleen: Normal in size without focal abnormality. Adrenals/Urinary Tract: Adrenal glands are unremarkable. Kidneys are normal, without renal calculi, focal lesion, or hydronephrosis. Bladder is unremarkable. Stomach/Bowel: The stomach is normal. There is inflammation of the second portion of the duodenum. There are duodenal diverticuli in this region. The inflammation is thought to be secondary to pancreatitis rather than from the duodenum given an elevated lipase. The small bowel is otherwise normal. Colonic diverticulosis without diverticulitis. The appendix is not visualized. Vascular/Lymphatic: Mild atherosclerotic change in the abdominal aorta. No aneurysm or dissection. Reproductive: Status post hysterectomy. No adnexal masses. Other: No abdominal wall hernia or abnormality. No abdominopelvic ascites. Musculoskeletal: No acute or significant osseous findings. IMPRESSION: 1. Fat stranding adjacent to the pancreas and adjacent duodenum/duodenal diverticuli is thought to be secondary  to pancreatitis given the patient's elevated lipase. 2. Colonic diverticulosis without diverticulitis. 3. Atherosclerotic change in the abdominal aorta. Electronically Signed   By: Dorise Bullion III M.D   On: 11/17/2017 17:48   Dg Abdomen Acute W/chest  Result Date: 11/17/2017 CLINICAL DATA:  Vomiting, shortness of breath. EXAM: DG ABDOMEN ACUTE W/ 1V CHEST COMPARISON:  None. FINDINGS: Dilated small bowel loops are noted in left upper quadrant. No colonic dilatation is noted. No radiopaque calculi or other significant radiographic abnormality is seen. Heart size and mediastinal contours are within normal limits. Both lungs are clear. IMPRESSION: Dilated small bowel loops seen in left upper quadrant of abdomen concerning for distal small bowel obstruction or focal ileus; follow-up radiographs are recommended. No acute cardiopulmonary disease. Electronically Signed   By: Marijo Conception, M.D.   On: 11/17/2017 15:41    Scheduled Meds: . apixaban  2.5 mg Oral BID  . buPROPion  300 mg Oral Daily  . docusate sodium  100 mg Oral BID  . gabapentin  600 mg Oral TID  . insulin aspart  0-15 Units Subcutaneous Q4H  . insulin glargine  10 Units Subcutaneous QHS  . levothyroxine  25 mcg Oral QAC breakfast  . lip balm      .  mometasone-formoterol  2 puff Inhalation BID  . propranolol  60 mg Oral BID  . senna  1 tablet Oral BID    Continuous Infusions: . lactated ringers 150 mL/hr at 11/18/17 0802     LOS: 1 day     Kayleen Memos, MD [facility] Pager [phone]  If 7PM-7AM, please contact night-coverage [email or web address] Password [facility] 11/18/2017, 8:53 AM

## 2017-11-18 NOTE — Progress Notes (Signed)
Pt c/o pain from IV potassium. States "that didn't start hurting until you added that little bag up there and now it's really painful." Pt declined having second run of Potassium after first bag infused.

## 2017-11-18 NOTE — Progress Notes (Signed)
Talked with patient extensively regarding admission process and questions that had to be answered. Pt reports that she did attempt suicide on Friday night by taking an overdose of Percocet after taking her bath. States she was certain "that would do the trick for me, I am so tired of all this hassle and problems, and not enough money." Pt states that she continues to have suicidal thoughts to date, and reports that she does have a plan, "the one last night didn't work, so I need to try the next one." Talked with pt at length regarding "challenges and hurdles that life throws our way at times" encouraged pt to talk with Social Worker, Comptroller while she is at the hospital and seek assistance and guidance with the problems that she is dealing with. Sitter at constant bedside.

## 2017-11-19 ENCOUNTER — Inpatient Hospital Stay (HOSPITAL_COMMUNITY): Payer: Medicare Other

## 2017-11-19 DIAGNOSIS — T50902A Poisoning by unspecified drugs, medicaments and biological substances, intentional self-harm, initial encounter: Secondary | ICD-10-CM

## 2017-11-19 DIAGNOSIS — R0902 Hypoxemia: Secondary | ICD-10-CM

## 2017-11-19 DIAGNOSIS — F332 Major depressive disorder, recurrent severe without psychotic features: Secondary | ICD-10-CM

## 2017-11-19 DIAGNOSIS — R195 Other fecal abnormalities: Secondary | ICD-10-CM

## 2017-11-19 DIAGNOSIS — R1084 Generalized abdominal pain: Secondary | ICD-10-CM

## 2017-11-19 LAB — GLUCOSE, CAPILLARY
Glucose-Capillary: 135 mg/dL — ABNORMAL HIGH (ref 65–99)
Glucose-Capillary: 150 mg/dL — ABNORMAL HIGH (ref 65–99)
Glucose-Capillary: 163 mg/dL — ABNORMAL HIGH (ref 65–99)
Glucose-Capillary: 177 mg/dL — ABNORMAL HIGH (ref 65–99)
Glucose-Capillary: 196 mg/dL — ABNORMAL HIGH (ref 65–99)
Glucose-Capillary: 229 mg/dL — ABNORMAL HIGH (ref 65–99)

## 2017-11-19 MED ORDER — DIPHENHYDRAMINE HCL 50 MG/ML IJ SOLN
12.5000 mg | Freq: Once | INTRAMUSCULAR | Status: AC
  Administered 2017-11-19: 12.5 mg via INTRAVENOUS
  Filled 2017-11-19: qty 1

## 2017-11-19 MED ORDER — KETOROLAC TROMETHAMINE 15 MG/ML IJ SOLN
7.5000 mg | Freq: Once | INTRAMUSCULAR | Status: AC
  Administered 2017-11-19: 7.5 mg via INTRAVENOUS
  Filled 2017-11-19: qty 1

## 2017-11-19 MED ORDER — MAGNESIUM SULFATE 2 GM/50ML IV SOLN
2.0000 g | Freq: Once | INTRAVENOUS | Status: AC
  Administered 2017-11-19: 2 g via INTRAVENOUS
  Filled 2017-11-19: qty 50

## 2017-11-19 MED ORDER — METOCLOPRAMIDE HCL 5 MG/ML IJ SOLN
5.0000 mg | Freq: Once | INTRAMUSCULAR | Status: AC
  Administered 2017-11-19: 5 mg via INTRAVENOUS
  Filled 2017-11-19: qty 2

## 2017-11-19 NOTE — Evaluation (Signed)
Clinical/Bedside Swallow Evaluation Patient Details  Name: Hayley [name] MRN: [id number] Date of Birth: 01-01-1946  Today's Date: 11/19/2017 Time: SLP Start Time (ACUTE ONLY): 1450 SLP Stop Time (ACUTE ONLY): 1520 SLP Time Calculation (min) (ACUTE ONLY): 30 min  Past Medical History:  Past Medical History:  Diagnosis Date  . Anemia   . Anxiety   . Bronchitis    hx of   . Clotting disorder ([place]) 02-17-14   S/p appendectomy-developed Pulmonary emboli-tx. warfarin-d/c 1 yr ago.  . Colon polyps   . Depression   . Diabetes mellitus without complication ([place])    "borderline"- oral med  . Disorder of vocal cords    spasmotic dysphonia ,02-17-14 has" whispery voice-low tone"  . Hyperthyroidism   . Kidney stones   . Migraines   . Nausea Nov 15, 2014   cycle of migraine headaches and nausea  . Neuromuscular disorder ([place])    hands and throat"spasmodic dysphonia", tremors - thimb and forefinger   . Obesity, Class III, BMI 40-49.9 (morbid obesity) ([place]) 04/27/2010   02-17-14 reports some weight loss- intentional  . Osteoarthritis    in back(chronic pain)  . Pelvic pain    02-17-14 "states thinks its back related on left groin"  . Pulmonary emboli [facility])    s/p Appendectomy '13 [facility]  . Reflux   . Right knee pain   . Sleep apnea    cpap - settings at 4   . Toxic goiter    Past Surgical History:  Past Surgical History:  Procedure Laterality Date  . ABDOMINAL HYSTERECTOMY    . APPENDECTOMY     3'13 [facility] s/p developed  Pulmonary Emboli  . BOTOX INJECTION     for migranes  . CATARACT EXTRACTION, BILATERAL Bilateral 03/2013   . CHOLECYSTECTOMY    . HAND SURGERY  2004   both hands  . KIDNEY STONE SURGERY    . KNEE ARTHROSCOPY  1998   right  . LAPAROSCOPIC APPENDECTOMY  11/27/2011   Procedure: APPENDECTOMY LAPAROSCOPIC;  Surgeon: Adin Hector, MD;  Location: [facility] ORS;  Service: General;  Laterality: N/A;  . SPINAL CORD STIMULATOR IMPLANT  06/04/2016   Dr. Alfonse Ras at [facility]  . TOTAL KNEE ARTHROPLASTY Right 12/06/2014   Procedure: RIGHT TOTAL KNEE ARTHROPLASTY;  Surgeon: Gaynelle Arabian, MD;  Location: [facility]S;  Service: Orthopedics;  Laterality: Right;  . TOTAL KNEE REVISION Left 02/24/2014   Procedure: LEFT TOTAL KNEE REVISION;  Surgeon: Gearlean Alf, MD;  Location: [facility];  Service: Orthopedics;  Laterality: Left;  . vocal cord surgery  02-17-14   01-12-14 done in [place] -[facility](s/p selective denervation/reinnervation recurrent laryngeal nerve surgery)  . zenkers diverticulum     HPI:  72 yo female with PMH significant for depression, DM2, COPD, CAD, hypothyroidism, essential tremor who presented to the ED after attempting suicide by ingesting 30 pills of percocet. Tachycardic on presentation. CT abd pelvis revealed suspicion for acute pancreatitis. Pt with hx of spasmodic dysphonia.  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 October 2018 exam by ENT revealed known abductor spasmodic dysphonia tremor and laryngeal candidiasis with purulent secretions covering the mucosa of the larynx and pharynx.  Pt was seen by Dr. Lake Bells from pulmonology in Nov 2018 for evaluation of cough.  He referred Hayley [name] for OP MBS due to concerns for potential silent aspiration; however, she missed her  scheduled appointment in November because of her husband's stroke.     Assessment / Plan / Recommendation Clinical Impression  Pt presents with no overt s/s of dysphagia per clinical examination.  However, the symptoms that she described during Dr. Anastasia Pall exam (cough, intermittent choking episodes) persist. Given the referral for MBS that could not be completed due to her life circumstances, offered to Hayley [name] the opportunity to evaluate her swallowing while she is admitted here.  She agreed she would like to pursue.  If pt is  here tomorrow, will proceed with study.  However, MBS is not a high priority during this admission, and can be completed as an OP when appropriate.     SLP Visit Diagnosis: Dysphagia, unspecified (R13.10)    Aspiration Risk  No limitations    Diet Recommendation   regular solids, thin liquids  Medication Administration: Whole meds with liquid    Other  Recommendations Oral Care Recommendations: Oral care BID   Follow up Recommendations        Frequency and Duration            Prognosis        Swallow Study   General HPI: 72 yo female with PMH significant for depression, DM2, COPD, CAD, hypothyroidism, essential tremor who presented to the ED after attempting suicide by ingesting 30 pills of percocet. Tachycardic on presentation. CT abd pelvis revealed suspicion for acute pancreatitis. Pt with hx of spasmodic dysphonia.  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 October 2018 exam by ENT revealed known abductor spasmodic dysphonia tremor and laryngeal candidiasis with purulent secretions covering the mucosa of the larynx and pharynx.  Pt was seen by Dr. Lake Bells from pulmonology in Nov 2018 for evaluation of cough.  He referred Hayley [name] for OP MBS due to concerns for potential silent aspiration; however, she missed her scheduled appointment in November because of her husband's stroke.   Type of Study: Bedside Swallow Evaluation Diet Prior to this Study: Regular;Thin liquids Temperature Spikes Noted: No Respiratory Status: Room air History of Recent Intubation: No Behavior/Cognition: Alert;Cooperative;Pleasant mood Oral Cavity Assessment: Within Functional Limits Oral Care Completed by SLP: No Oral Cavity - Dentition: Missing dentition Vision: Functional for self-feeding Self-Feeding Abilities: Able to feed self Patient  Positioning: Upright in bed Baseline Vocal Quality: Hoarse;Other (comment)(tremor) Volitional Cough: Strong Volitional Swallow: Able to elicit    Oral/Motor/Sensory Function Overall Oral Motor/Sensory Function: Within functional limits   Ice Chips Ice chips: Within functional limits   Thin Liquid Thin Liquid: Within functional limits    Nectar Thick Nectar Thick Liquid: Not tested   Honey Thick Honey Thick Liquid: Not tested   Puree Puree: Within functional limits   Solid   GO   Solid: Within functional limits        Juan Quam Laurice 11/19/2017,3:31 PM

## 2017-11-19 NOTE — Progress Notes (Addendum)
PROGRESS NOTE  Hayley [name] OAC:[id number] DOB: 1946/01/22 DOA: 11/17/2017 PCP: [facility]  HPI/Recap of past 24 hours: [name] is a 72 yo female with PMH significant for depression, DM2, COPD, CAD, hypothyroidism, essential tremor who presented to the ED after attempting suicide by ingesting 30 pills of percocet. Tachycardic on presentation. CT abd pelvis revealed suspicion for acute pancreatitis. Lipase 82. Acetaminophen level <10.  11/18/17: continues to have suicidal ideation and feel hopeless, helpless. psychiatry consulted and recommended psych inpatient admission. Reports persistent nausea with no vomiting and moderate abdominal discomfort when she eats.  11/19/17: cxr ordered due to hypoxia. Abd xray done due to abdominal pain. Chest xray revealed atelectasis as reviewed by self. No sign of ileus on abd xray. Nausea is improved. Reports loose stools.  Assessment/Plan: Principal Problem:   MDD (major depressive episode), single episode, severe, no psychosis ([place]) Active Problems:   Pancreatitis   Suicidal attempt/severe depression -acetaminophen level <10 -ingestion of 30 pills of percocet -Psychiatry consulted -Psychiatry highly appreciated -recommends [facility] psych facility admission -social work consulted for placement  Mild Acute pancreatitis -clear liquid diet -Advance diet as tolerated -Iv zofran for nausea -avoid narcotic for pain management  Acute hypoxia, improving -cxr revealed atelectasis no infiltrates as reviewed by self -not on antibiotics -continue nebs  Loose stools -2 reported this am -abd xray unremarkable for any acute findings -continue to monitor -afebrile, no leukocytosis  Hx of DVT -continue eliquis  DM2 -continue ISS  Undocumented COPD -continue breathing treatments -continue O2 supp to maintain O2 sat 92% or greater  Polyneuropathy -continue gabapentin  Essential tremor -on propranolol -stooped due to  hypotension  Hypotension, improving  Morbid obesity -weight loss in outpatient when more stable  Hypothyroidism -continue synthroid  Hypokalemia, resolved -K+ 3.8 from 3.3  Hypomagnesemia -mag 1.7 -replete  -repeat levels am   Code Status: full  Family Communication: daughter at bedside   Disposition Plan: to be admitted at [facility] psych facility possibly tomorrow if bed available   Consultants:  Psychiatry  Social worker  Procedures: None  Antimicrobials:  none  DVT prophylaxis:  eliquis   Objective: Vitals:   11/18/17 1400 11/18/17 2100 11/19/17 0554 11/19/17 0646  BP: (!) 105/56 114/71 130/73   Pulse: 82 83 70   Resp: 18 18 18    Temp: 99.2 F (37.3 C) 98.9 F (37.2 C) 99 F (37.2 C)   TempSrc: Oral Oral Oral   SpO2: 90%  93%   Weight:    105.6 kg (232 lb 12.9 oz)  Height:        Intake/Output Summary (Last 24 hours) at 11/19/2017 0824 Last data filed at 11/19/2017 0600 Gross per 24 hour  Intake 4490 ml  Output -  Net 4490 ml   Filed Weights   11/18/17 0803 11/19/17 0646  Weight: 106.3 kg (234 lb 5.6 oz) 105.6 kg (232 lb 12.9 oz)    Exam: 11/19/17 patient seen and examined. Physical exam unchanged from yesterday.   General:  72 yo CF obese NAD A&O x 3   Cardiovascular: RRR no rubs or gallops  Respiratory: CTA no wheezes or rales   Abdomen: obese NT ND NBS x4  Musculoskeletal: non focal   Skin: bruising right upper extremity  Psychiatry: mood is anxious   Data Reviewed: CBC: Recent Labs  Lab 11/17/17 1340 11/18/17 0508  WBC 10.0 8.6  HGB 14.9 14.7  HCT 45.6 45.9  MCV 92.7 91.4  PLT 247 210   Basic  Metabolic Panel: Recent Labs  Lab 11/17/17 1340 11/17/17 2213 11/18/17 0508  NA 137  --  141  K 3.3*  --  3.8  CL 99*  --  103  CO2 29  --  30  GLUCOSE 207*  --  164*  BUN 7  --  6  CREATININE 0.67  --  0.67  CALCIUM 8.8*  --  8.6*  MG  --  1.7  --   PHOS  --  2.3*  --    GFR: Estimated Creatinine Clearance: 78.1  mL/min (by C-G formula based on SCr of 0.67 mg/dL). Liver Function Tests: Recent Labs  Lab 11/17/17 1340  AST 28  ALT 18  ALKPHOS 61  BILITOT 0.7  PROT 7.1  ALBUMIN 3.6   Recent Labs  Lab 11/17/17 1340  LIPASE 82*   No results for input(s): AMMONIA in the last 168 hours. Coagulation Profile: Recent Labs  Lab 11/17/17 1340  INR 1.05   Cardiac Enzymes: No results for input(s): CKTOTAL, CKMB, CKMBINDEX, TROPONINI in the last 168 hours. BNP (last 3 results) No results for input(s): PROBNP in the last 8760 hours. HbA1C: No results for input(s): HGBA1C in the last 72 hours. CBG: Recent Labs  Lab 11/18/17 1628 11/18/17 2028 11/18/17 2355 11/19/17 0345 11/19/17 0739  GLUCAP 143* 167* 188* 135* 150*   Lipid Profile: Recent Labs    11/17/17 2213  TRIG 122   Thyroid Function Tests: Recent Labs    11/17/17 2050  TSH 1.348   Anemia Panel: Recent Labs    11/17/17 2213  VITAMINB12 876   Urine analysis:    Component Value Date/Time   COLORURINE YELLOW 11/17/2017 1416   APPEARANCEUR CLEAR 11/17/2017 1416   APPEARANCEUR CLOUDY 12/19/2014 0730   LABSPEC 1.003 (L) 11/17/2017 1416   LABSPEC 1.025 12/19/2014 0730   PHURINE 7.0 11/17/2017 1416   GLUCOSEU NEGATIVE 11/17/2017 1416   GLUCOSEU see comment 12/19/2014 0730   HGBUR NEGATIVE 11/17/2017 1416   BILIRUBINUR NEGATIVE 11/17/2017 1416   BILIRUBINUR n 05/08/2016 1506   BILIRUBINUR see comment 12/19/2014 0730   KETONESUR NEGATIVE 11/17/2017 1416   PROTEINUR NEGATIVE 11/17/2017 1416   UROBILINOGEN negative 05/08/2016 1506   UROBILINOGEN 1.0 11/30/2014 1215   NITRITE NEGATIVE 11/17/2017 1416   LEUKOCYTESUR NEGATIVE 11/17/2017 1416   LEUKOCYTESUR see comment 12/19/2014 0730   Sepsis Labs: @LABRCNTIP (procalcitonin:4,lacticidven:4)  ) Recent Results (from the past 240 hour(s))  Urine culture     Status: None   Collection Time: 11/17/17  2:16 PM  Result Value Ref Range Status   Specimen Description   Final     URINE, CLEAN CATCH Performed at [facility], [place] [address] [place], [place] [address]    Special Requests   Final    Normal Performed at [facility], [place] [address], [place], [place] [address]    Culture   Final    NO GROWTH Performed at [facility], [place] [address] [place],  [address]    Report Status 11/18/2017 FINAL  Final      Studies: No results found.  Scheduled Meds: . apixaban  2.5 mg Oral BID  . buPROPion  300 mg Oral Daily  . docusate sodium  100 mg Oral BID  . gabapentin  600 mg Oral TID  . insulin aspart  0-15 Units Subcutaneous Q4H  . insulin glargine  10 Units Subcutaneous QHS  . levothyroxine  25 mcg Oral QAC breakfast  . mometasone-formoterol  2 puff  Inhalation BID  . propranolol  60 mg Oral BID  . senna  1 tablet Oral BID    Continuous Infusions: . lactated ringers 1,000 mL (11/19/17 0329)     LOS: 2 days     Kayleen Memos, MD [facility] Pager [phone]  If 7PM-7AM, please contact night-coverage [email or web address] Password [facility] 11/19/2017, 8:24 AM

## 2017-11-19 NOTE — Clinical Social Work Note (Signed)
Clinical Social Work Assessment  Patient Details  Name: Hayley [name] MRN: [id number] Date of Birth: [birth year]  Date of referral:  11/19/17               Reason for consult:  Facility Placement                Permission sought to share information with:  Case Manager, Customer service manager, Teacher, music, Family Supports Permission granted to share information::  Yes, Verbal Permission Granted  Name::     Hayley [name], Hayley [name] and Mining engineer::  Inpatient Psych  Relationship::  Adult children  Contact Information:     Housing/Transportation Living arrangements for the past 2 months:  Single Family Home Source of Information:  Patient, Adult Children Patient Interpreter Needed:  None Criminal Activity/Legal Involvement Pertinent to Current Situation/Hospitalization:  No - Comment as needed Significant Relationships:  None Lives with:  Spouse Do you feel safe going back to the place where you live?  Yes Need for family participation in patient care:  Yes (Comment)  Care giving concerns:  Patient has several psychosocial stressors. Patient is going through financial constraints and a transition with her spouse.    Social Worker assessment / plan:  LCSW consulted for gero psych placement.  Patient admitted for intentional OD.   LCSW met with patient at bedside. Patient's daughter, Hayley [name], son in law, Hayley [name] are present and son, Hayley [name] by phone.   LCSW explained role and reason for visit.   Patient is agreeable to go voluntary to [facility].   Patient came from home where she lives with her husband. Patient and husband are going through a transition. Patient reports that her husband had a stroke and has had falls and other safety issues.   Patient reports that she is experiencing financial issues and issues with her spouse. Due to spouse is level of care patient reports he will be moving out of the home to live with his daughter whose home is more accessible.   PLAN: Patient will go  inpatient gero psych wen medically stable.   Employment status:  Retired Forensic scientist:  Medicare PT Recommendations:  Not assessed at this time Information / Referral to community resources:  [place] (Comment Required)(inpatient psych referral)  Patient/Family's Response to care:  Patient and family are thankful for LCSW visit. Patient and family asked questions regarding gero psych and community resources. LCSW answered questions.   Patient/Family's Understanding of and Emotional Response to Diagnosis, Current Treatment, and Prognosis:  Patient expressed that she is fearful and does not know what to expect. Patient asked a lot of questions regarding the gero psych placement process. Family is present and supportive. Family is proactive and asked about community resources to support patient after placement.   Emotional Assessment Appearance:  Appears stated age Attitude/Demeanor/Rapport:    Affect (typically observed):  Afraid/Fearful, Pleasant Orientation:  Oriented to Self, Oriented to Place, Oriented to  Time, Oriented to Situation Alcohol / Substance use:  Not Applicable Psych involvement (Current and /or in the community):  No (Comment)  Discharge Needs  Concerns to be addressed:  Financial / Insurance Concerns, Coping/Stress Concerns Readmission within the last 30 days:  No Current discharge risk:  None Barriers to Discharge:  Psych Bed not available   Servando Snare, LCSW 11/19/2017, 3:10 PM

## 2017-11-20 ENCOUNTER — Inpatient Hospital Stay (HOSPITAL_COMMUNITY): Payer: Medicare Other

## 2017-11-20 LAB — CBC WITH DIFFERENTIAL/PLATELET
Basophils Absolute: 0 10*3/uL (ref 0.0–0.1)
Basophils Relative: 0 %
Eosinophils Absolute: 0.2 10*3/uL (ref 0.0–0.7)
Eosinophils Relative: 3 %
HCT: 39.7 % (ref 36.0–46.0)
Hemoglobin: 12.6 g/dL (ref 12.0–15.0)
Lymphocytes Relative: 16 %
Lymphs Abs: 1.1 10*3/uL (ref 0.7–4.0)
MCH: 29.9 pg (ref 26.0–34.0)
MCHC: 31.7 g/dL (ref 30.0–36.0)
MCV: 94.3 fL (ref 78.0–100.0)
Monocytes Absolute: 0.7 10*3/uL (ref 0.1–1.0)
Monocytes Relative: 10 %
Neutro Abs: 4.8 10*3/uL (ref 1.7–7.7)
Neutrophils Relative %: 71 %
Platelets: 226 10*3/uL (ref 150–400)
RBC: 4.21 MIL/uL (ref 3.87–5.11)
RDW: 14.2 % (ref 11.5–15.5)
WBC: 6.8 10*3/uL (ref 4.0–10.5)

## 2017-11-20 LAB — MAGNESIUM: Magnesium: 2.3 mg/dL (ref 1.7–2.4)

## 2017-11-20 LAB — URINALYSIS, ROUTINE W REFLEX MICROSCOPIC
Bacteria, UA: NONE SEEN
Bilirubin Urine: NEGATIVE
Glucose, UA: 50 mg/dL — AB
Hgb urine dipstick: NEGATIVE
Ketones, ur: NEGATIVE mg/dL
Nitrite: NEGATIVE
Protein, ur: NEGATIVE mg/dL
Specific Gravity, Urine: 1.006 (ref 1.005–1.030)
Squamous Epithelial / LPF: NONE SEEN
pH: 8 (ref 5.0–8.0)

## 2017-11-20 LAB — GLUCOSE, CAPILLARY
Glucose-Capillary: 103 mg/dL — ABNORMAL HIGH (ref 65–99)
Glucose-Capillary: 133 mg/dL — ABNORMAL HIGH (ref 65–99)
Glucose-Capillary: 156 mg/dL — ABNORMAL HIGH (ref 65–99)
Glucose-Capillary: 157 mg/dL — ABNORMAL HIGH (ref 65–99)
Glucose-Capillary: 170 mg/dL — ABNORMAL HIGH (ref 65–99)
Glucose-Capillary: 194 mg/dL — ABNORMAL HIGH (ref 65–99)

## 2017-11-20 LAB — BASIC METABOLIC PANEL
Anion gap: 7 (ref 5–15)
Anion gap: 8 (ref 5–15)
BUN: 6 mg/dL (ref 6–20)
BUN: 7 mg/dL (ref 6–20)
CO2: 30 mmol/L (ref 22–32)
CO2: 28 mmol/L (ref 22–32)
Calcium: 8.7 mg/dL — ABNORMAL LOW (ref 8.9–10.3)
Calcium: 8.4 mg/dL — ABNORMAL LOW (ref 8.9–10.3)
Chloride: 106 mmol/L (ref 101–111)
Chloride: 102 mmol/L (ref 101–111)
Creatinine, Ser: 0.54 mg/dL (ref 0.44–1.00)
Creatinine, Ser: 0.77 mg/dL (ref 0.44–1.00)
GFR calc Af Amer: >60 mL/min (ref >60)
GFR calc Af Amer: >60 mL/min (ref >60)
GFR calc non Af Amer: >60 mL/min (ref >60)
GFR calc non Af Amer: >60 mL/min (ref >60)
Glucose, Bld: 120 mg/dL — ABNORMAL HIGH (ref 65–99)
Glucose, Bld: 181 mg/dL — ABNORMAL HIGH (ref 65–99)
Potassium: 4.1 mmol/L (ref 3.5–5.1)
Potassium: 3.7 mmol/L (ref 3.5–5.1)
Sodium: 143 mmol/L (ref 135–145)
Sodium: 138 mmol/L (ref 135–145)

## 2017-11-20 LAB — LIPASE, BLOOD: Lipase: 33 U/L (ref 11–51)

## 2017-11-20 MED ORDER — HYDROCOD POLST-CPM POLST ER 10-8 MG/5ML PO SUER
5.0000 mL | Freq: Once | ORAL | Status: AC
  Administered 2017-11-20: 5 mL via ORAL
  Filled 2017-11-20: qty 5

## 2017-11-20 MED ORDER — GI COCKTAIL ~~LOC~~
30.0000 mL | Freq: Once | ORAL | Status: AC
  Administered 2017-11-20: 30 mL via ORAL
  Filled 2017-11-20: qty 30

## 2017-11-20 MED ORDER — KETOROLAC TROMETHAMINE 15 MG/ML IJ SOLN
15.0000 mg | Freq: Once | INTRAMUSCULAR | Status: AC
  Administered 2017-11-20: 15 mg via INTRAVENOUS
  Filled 2017-11-20: qty 1

## 2017-11-20 MED ORDER — METOCLOPRAMIDE HCL 5 MG/ML IJ SOLN
10.0000 mg | Freq: Once | INTRAMUSCULAR | Status: AC
  Administered 2017-11-20: 10 mg via INTRAVENOUS
  Filled 2017-11-20: qty 2

## 2017-11-20 MED ORDER — CIPROFLOXACIN HCL 500 MG PO TABS: 500.0000 mg | ORAL_TABLET | Freq: Two times a day (BID) | ORAL | Status: DC

## 2017-11-20 MED ORDER — GUAIFENESIN ER 600 MG PO TB12
1200.0000 mg | ORAL_TABLET | Freq: Two times a day (BID) | ORAL | Status: DC
  Administered 2017-11-20 (×3): 1200 mg via ORAL
  Administered 2017-11-21: 600 mg via ORAL
  Administered 2017-11-21 – 2017-12-02 (×22): 1200 mg via ORAL
  Filled 2017-11-20 (×28): qty 2

## 2017-11-20 MED ORDER — LEVOFLOXACIN 500 MG PO TABS
500.0000 mg | ORAL_TABLET | ORAL | Status: AC
  Administered 2017-11-20 – 2017-11-22 (×3): 500 mg via ORAL
  Filled 2017-11-20 (×3): qty 1

## 2017-11-20 MED ORDER — DIPHENHYDRAMINE HCL 50 MG/ML IJ SOLN
25.0000 mg | Freq: Once | INTRAMUSCULAR | Status: AC
  Administered 2017-11-20: 25 mg via INTRAVENOUS
  Filled 2017-11-20: qty 1

## 2017-11-20 NOTE — Care Management Important Message (Signed)
Important Message  Patient Details  Name: Hayley [name] MRN: [id number] Date of Birth: [birth year]   Medicare Important Message Given:  Yes    Kerin Salen 11/20/2017, 11:13 AMImportant Message  Patient Details  Name: Hayley [name] MRN: [id number] Date of Birth: 19-Oct-1945   Medicare Important Message Given:  Yes    Kerin Salen 11/20/2017, 11:12 AM

## 2017-11-20 NOTE — Progress Notes (Signed)
Pt. found off CPAP, staff aware, sitter remains in room.

## 2017-11-20 NOTE — Progress Notes (Signed)
Patient is complaining of a cough and is requesting cough medicine. Dr. Hilbert Bible was paged. RN is waiting on orders.

## 2017-11-20 NOTE — Progress Notes (Signed)
Pt. set up with CPAP Auto after order obtained and is tolerating well, humidifier filled with SW, sitter remains at bedside, currently on room air, made both aware to notify RT if needing adjusted, RN aware.

## 2017-11-20 NOTE — Progress Notes (Addendum)
LCSW following for gero psych placement.  LCSW faxed patient out to the following facilities.  [facility]- No beds avaiable  [facility]- per Delana Meyer denied- pt too medical acuity (names pancreatitis and hx of SBO)  [facility]-  [facility]- declined due to high acuity.   [facility]-

## 2017-11-20 NOTE — Progress Notes (Signed)
Patient is complaining of a headache. Dr on call was paged for an order for a headache cocktail. Waiting on orders.

## 2017-11-20 NOTE — Progress Notes (Signed)
Modified Barium Swallow Progress Note  Patient Details  Name: HELEENA MICELI MRN: [id number] Date of Birth: 1945/10/15  Today's Date: 11/20/2017  Modified Barium Swallow completed.  Full report located under Chart Review in the Imaging Section.  Brief recommendations include the following:  Clinical Impression  Pt presents with a normal oropharyngeal swallow with adequate mastication, timely swallow response, good pharyngeal clearance of POs, no penetration/aspiration, despite large consecutive thin liquid boluses.  Notable was retention of barium immediately inferior to cricopharyngeus - residue remained at least one minute after completion of study.  Pt viewed video and we discussed results. Pt may benefit from further esophageal w/u at some point.  Continue diet per pt's preferences; thin liquids; meds whole in puree.  No SLP f/u warranted.    Swallow Evaluation Recommendations   Recommended Consults: Consider esophageal assessment   SLP Diet Recommendations: Dysphagia 3 (Mech soft) solids;Thin liquid   Liquid Administration via: Cup;Straw   Medication Administration: Whole meds with liquid   Supervision: Patient able to self feed                    Juan Quam Laurice 11/20/2017,1:32 PM

## 2017-11-20 NOTE — Progress Notes (Signed)
PROGRESS NOTE  Hayley [name] HWE:[id number] DOB: August 20, 1946 DOA: 11/17/2017 PCP: [facility]  HPI/Recap of past 24 hours: Hayley [name] is a 72 yo female with PMH significant for depression, DM2, COPD, CAD, hypothyroidism, essential tremor who presented to the ED after attempting suicide by ingesting 30 pills of percocet. Tachycardic on presentation. CT abd pelvis revealed suspicion for acute pancreatitis. Lipase 82. Acetaminophen level <10.  11/18/17: continues to have suicidal ideation and feel hopeless, helpless. psychiatry consulted and recommended psych inpatient admission. Reports persistent nausea with no vomiting and moderate abdominal discomfort when she eats.  11/19/17: cxr ordered due to hypoxia. Abd xray done due to abdominal pain. Chest xray revealed atelectasis as reviewed by self. No sign of ileus on abd xray. Nausea is improved. Reports loose stools.  11/20/17 -patient was seen and examined this morning, stable.  Complaint of headaches, low-grade fever and chills.  Nonproductive cough but denies any dysuria. Daughter present at bedside, no other complaints. No issues overnight  --------------------------------------------------------------------------------------------------------------------------  Assessment/Plan: Principal Problem:   MDD (major depressive episode), single episode, severe, no psychosis ([place]) Active Problems:   Pancreatitis   Suicidal attempt/severe depression -Stable today, good spirits, denies any homicidal suicidal ideation -Daughter and a sitter present at bedside -acetaminophen level <10 -ingestion of 30 pills of percocet -Psychiatry consulted, following accordingly -Psychiatry highly appreciated -recommends [facility] facility admission -social work consulted -pending geriatric psych discharge  Fever with T-max of 100.5, this morning 99.1, -Frontal sinus headache -We will order labs, chest x-ray negative, UA pending -We will  order labs CBC and BMP -Initiate broad-spectrum antibiotics p.o. Levaquin for now   Mild Acute pancreatitis -clear liquid diet -Advance diet as tolerated -Iv zofran for nausea -avoid narcotic for pain management  Acute hypoxia, improving -cxr revealed atelectasis no infiltrates as reviewed by self -not on antibiotics -continue nebs  Loose stools -2 reported this am -abd xray unremarkable for any acute findings -continue to monitor -afebrile, no leukocytosis  Hx of DVT -continue eliquis  DM2 -continue ISS  Undocumented COPD -continue breathing treatments -continue O2 supp to maintain O2 sat 92% or greater  Polyneuropathy -continue gabapentin  Essential tremor -on propranolol -stooped due to hypotension  Hypotension, improving  Morbid obesity -weight loss in outpatient when more stable  Hypothyroidism -continue synthroid  Hypokalemia, resolved -K+  Today   Hypomagnesemia -mag 1.7 -replete  -repeat levels am  Dysphagia -Status post speech evaluation, recommending dysphagia 3 mechanical soft solids, thin liquids May use cup straw, hold meds with liquids,   Code Status: full  Family Communication: daughter at bedside   Disposition Plan: to be admitted at [facility] psych facility possibly tomorrow if bed available Pending bed availability   Consultants:  Psychiatry  Social worker  Procedures: None  Antimicrobials:  none  DVT prophylaxis:  eliquis   Objective: Vitals:   11/20/17 0210 11/20/17 0500 11/20/17 0545 11/20/17 0748  BP:   122/68   Pulse:   76   Resp:   18   Temp: 98.9 F (37.2 C)  99.1 F (37.3 C)   TempSrc: Oral  Oral   SpO2:   96% 95%  Weight:  108.2 kg (238 lb 8 oz)    Height:        Intake/Output Summary (Last 24 hours) at 11/20/2017 1337 Last data filed at 11/20/2017 1204 Gross per 24 hour  Intake 3840 ml  Output 500 ml  Net 3340 ml   Filed Weights   11/18/17 0803 11/19/17 8938  11/20/17 0500  Weight: 106.3 kg  (234 lb 5.6 oz) 105.6 kg (232 lb 12.9 oz) 108.2 kg (238 lb 8 oz)    Exam: 11/19/17 patient seen and examined. Physical exam unchanged from yesterday.   General:  72 yo CF obese NAD A&O x 3   Cardiovascular: RRR no rubs or gallops  Respiratory: CTA no wheezes or rales   Abdomen: obese NT ND NBS x4  Musculoskeletal: non focal   Skin: bruising right upper extremity  Psychiatry: mood is anxious   Data Reviewed: CBC: Recent Labs  Lab 11/17/17 1340 11/18/17 0508  WBC 10.0 8.6  HGB 14.9 14.7  HCT 45.6 45.9  MCV 92.7 91.4  PLT 247 756   Basic Metabolic Panel: Recent Labs  Lab 11/17/17 1340 11/17/17 2213 11/18/17 0508 11/20/17 0454  NA 137  --  141 143  K 3.3*  --  3.8 4.1  CL 99*  --  103 106  CO2 29  --  30 30  GLUCOSE 207*  --  164* 120*  BUN 7  --  6 6  CREATININE 0.67  --  0.67 0.54  CALCIUM 8.8*  --  8.6* 8.7*  MG  --  1.7  --  2.3  PHOS  --  2.3*  --   --    GFR: Estimated Creatinine Clearance: 79.2 mL/min (by C-G formula based on SCr of 0.54 mg/dL). Liver Function Tests: Recent Labs  Lab 11/17/17 1340  AST 28  ALT 18  ALKPHOS 61  BILITOT 0.7  PROT 7.1  ALBUMIN 3.6   Recent Labs  Lab 11/17/17 1340 11/20/17 0454  LIPASE 82* 33   No results for input(s): AMMONIA in the last 168 hours. Coagulation Profile: Recent Labs  Lab 11/17/17 1340  INR 1.05   Cardiac Enzymes: No results for input(s): CKTOTAL, CKMB, CKMBINDEX, TROPONINI in the last 168 hours. BNP (last 3 results) No results for input(s): PROBNP in the last 8760 hours. HbA1C: No results for input(s): HGBA1C in the last 72 hours. CBG: Recent Labs  Lab 11/19/17 1948 11/19/17 2355 11/20/17 0403 11/20/17 0742 11/20/17 1202  GLUCAP 163* 196* 103* 156* 170*   Lipid Profile: Recent Labs    11/17/17 2213  TRIG 122   Thyroid Function Tests: Recent Labs    11/17/17 2050  TSH 1.348   Anemia Panel: Recent Labs    11/17/17 2213  VITAMINB12 876   Urine analysis:      Component Value Date/Time   COLORURINE YELLOW 11/17/2017 1416   APPEARANCEUR CLEAR 11/17/2017 1416   APPEARANCEUR CLOUDY 12/19/2014 0730   LABSPEC 1.003 (L) 11/17/2017 1416   LABSPEC 1.025 12/19/2014 0730   PHURINE 7.0 11/17/2017 1416   GLUCOSEU NEGATIVE 11/17/2017 1416   GLUCOSEU see comment 12/19/2014 0730   HGBUR NEGATIVE 11/17/2017 1416   BILIRUBINUR NEGATIVE 11/17/2017 1416   BILIRUBINUR n 05/08/2016 1506   BILIRUBINUR see comment 12/19/2014 0730   KETONESUR NEGATIVE 11/17/2017 1416   PROTEINUR NEGATIVE 11/17/2017 1416   UROBILINOGEN negative 05/08/2016 1506   UROBILINOGEN 1.0 11/30/2014 1215   NITRITE NEGATIVE 11/17/2017 1416   LEUKOCYTESUR NEGATIVE 11/17/2017 1416   LEUKOCYTESUR see comment 12/19/2014 0730   Sepsis Labs: @LABRCNTIP (procalcitonin:4,lacticidven:4)  ) Recent Results (from the past 240 hour(s))  Urine culture     Status: None   Collection Time: 11/17/17  2:16 PM  Result Value Ref Range Status   Specimen Description   Final    URINE, CLEAN CATCH Performed at [facility], [place]  [address], [place], [place] [address]    Special Requests   Final    Normal Performed at [facility], [place] [address] [place], [place] [address]    Culture   Final    NO GROWTH Performed at [facility], [place] [address] [place], [place] [address]    Report Status 11/18/2017 FINAL  Final      Studies: Dg Chest Bilateral Decubitus  Result Date: 11/20/2017 CLINICAL DATA:  Cough today.  Follow-up atelectasis. EXAM: CHEST - BILATERAL DECUBITUS VIEW COMPARISON:  Portable chest 11/19/2017 and chest CT 11/17/2017. FINDINGS: Bilateral decubitus views demonstrate no significant layering pleural fluid. The non dependent lung appears adequately aerated on these views. Spinal stimulator noted. IMPRESSION: No significant layering pleural fluid or focal airspace disease. Electronically Signed   By: Richardean Sale M.D.   On: 11/20/2017 11:11     Scheduled Meds: . apixaban  2.5 mg Oral BID  . buPROPion  300 mg Oral Daily  . docusate sodium  100 mg Oral BID  . gabapentin  600 mg Oral TID  . guaiFENesin  1,200 mg Oral BID  . insulin aspart  0-15 Units Subcutaneous Q4H  . insulin glargine  10 Units Subcutaneous QHS  . levofloxacin  500 mg Oral Daily  . levothyroxine  25 mcg Oral QAC breakfast  . mometasone-formoterol  2 puff Inhalation BID  . senna  1 tablet Oral BID    Continuous Infusions: . lactated ringers 150 mL/hr at 11/19/17 2320     LOS: 3 days     Deatra James, MD [facility] Pager [phone]  If 7PM-7AM, please contact night-coverage [email or web address] Password [email or web address] 11/20/2017, 1:37 PM

## 2017-11-21 LAB — GLUCOSE, CAPILLARY
Glucose-Capillary: 120 mg/dL — ABNORMAL HIGH (ref 65–99)
Glucose-Capillary: 144 mg/dL — ABNORMAL HIGH (ref 65–99)
Glucose-Capillary: 120 mg/dL — ABNORMAL HIGH (ref 65–99)
Glucose-Capillary: 259 mg/dL — ABNORMAL HIGH (ref 65–99)
Glucose-Capillary: 205 mg/dL — ABNORMAL HIGH (ref 65–99)

## 2017-11-21 NOTE — Progress Notes (Signed)
PROGRESS NOTE  Hayley [name] WCB:[id number] DOB: 07/25/1946 DOA: 11/17/2017 PCP: [facility]  HPI/Recap of past 24 hours: [name] is a 72 yo female with PMH significant for depression, DM2, COPD, CAD, hypothyroidism, essential tremor who presented to the ED after attempting suicide by ingesting 30 pills of percocet. Tachycardic on presentation. CT abd pelvis revealed suspicion for acute pancreatitis. Lipase 82. Acetaminophen level <10.  11/18/17: continues to have suicidal ideation and feel hopeless, helpless. psychiatry consulted and recommended psych inpatient admission. Reports persistent nausea with no vomiting and moderate abdominal discomfort when she eats.  11/19/17: cxr ordered due to hypoxia. Abd xray done due to abdominal pain. Chest xray revealed atelectasis as reviewed by self. No sign of ileus on abd xray. Nausea is improved. Reports loose stools.  11/20/17 -patient was seen and examined this morning, stable.  Complaint of headaches, low-grade fever and chills.  Nonproductive cough but denies any dysuria. Daughter present at bedside, no other complaints. No issues overnight  04/05/18-patient was seen and examined this morning, reporting that she is feeling much better.  She is in good spirits this morning, accompanied by family No issues overnight, but still reporting she feels warm as not know if she had a fever overnight. Temp overnight 99.2  --------------------------------------------------------------------------------------------------------------------------  Assessment/Plan: Principal Problem:   MDD (major depressive episode), single episode, severe, no psychosis ([place]) Active Problems:   Pancreatitis   Suicidal attempt/severe depression -Stable denies any suicidal homicidal ideation -Daughter and a sitter present at bedside -acetaminophen level <10 -ingestion of 30 pills of percocet -Psychiatry consulted, following accordingly -recommends [facility]  psych facility admission -social work consulted -pending geriatric psych discharge  Fever with T-max of 100.5, this morning 99.2, , -Improved headaches -UA revealed leukocyte esterase, negative for any nitrites, negative for any PVCs -Chest x-ray within normal limits -We will continue with p.o. antibiotics  Mild Acute pancreatitis -clear liquid diet-advancing diet as tolerated -Continue as needed Iv zofran for nausea -avoid narcotic for pain management  Acute hypoxia, improving -cxr revealed atelectasis no infiltrates as reviewed by self -not on antibiotics -continue nebs  Loose stools -Per patient,  improving -abd xray unremarkable for any acute findings -continue to monitor -afebrile, no leukocytosis  Hx of DVT -continue eliquis  DM2 -continue ISS  Undocumented COPD -continue breathing treatments -continue O2 supp to maintain O2 sat 92% or greater  Polyneuropathy -continue gabapentin  Essential tremor -on propranolol -stooped due to hypotension  Hypotension, improving  Morbid obesity -weight loss in outpatient when more stable  Hypothyroidism -continue synthroid  Hypokalemia, resolved -K+  Today   Hypomagnesemia -mag 1.7 -replete  -repeat levels am  Dysphagia -Status post speech evaluation, recommending dysphagia 3 mechanical soft solids, thin liquids May use cup straw, hold meds with liquids,   Code Status: full  Family Communication: daughter at bedside   Disposition Plan: to be admitted at [facility] psych facility possibly tomorrow if bed available Pending bed availability   Consultants:  Psychiatry  Social worker  Procedures: None  Antimicrobials:  none  DVT prophylaxis:  eliquis   Objective: Vitals:   11/20/17 1728 11/20/17 1838 11/20/17 2210 11/21/17 0517  BP: 135/70  (!) 148/83 140/65  Pulse: 91  97 94  Resp: 18  18 18   Temp: 98.1 F (36.7 C)  99 F (37.2 C) 99.2 F (37.3 C)  TempSrc: Oral  Oral Oral  SpO2: 94% 94%  95% 92%  Weight:    109.6 kg (241 lb 10 oz)  Height:  Intake/Output Summary (Last 24 hours) at 11/21/2017 1330 Last data filed at 11/21/2017 0500 Gross per 24 hour  Intake 3945 ml  Output 1600 ml  Net 2345 ml   Filed Weights   11/19/17 0646 11/20/17 0500 11/21/17 0517  Weight: 105.6 kg (232 lb 12.9 oz) 108.2 kg (238 lb 8 oz) 109.6 kg (241 lb 10 oz)    Exam: 11/19/17 patient seen and examined. Physical exam unchanged from yesterday.   General:  72 yo CF obese NAD A&O x 3   Cardiovascular: RRR no rubs or gallops  Respiratory: CTA no wheezes or rales   Abdomen: obese NT ND NBS x4  Musculoskeletal: non focal   Skin: bruising right upper extremity  Psychiatry: mood is anxious   Data Reviewed: CBC: Recent Labs  Lab 11/17/17 1340 11/18/17 0508 11/20/17 1400  WBC 10.0 8.6 6.8  NEUTROABS  --   --  4.8  HGB 14.9 14.7 12.6  HCT 45.6 45.9 39.7  MCV 92.7 91.4 94.3  PLT 247 210 660   Basic Metabolic Panel: Recent Labs  Lab 11/17/17 1340 11/17/17 2213 11/18/17 0508 11/20/17 0454 11/20/17 1400  NA 137  --  141 143 138  K 3.3*  --  3.8 4.1 3.7  CL 99*  --  103 106 102  CO2 29  --  30 30 28   GLUCOSE 207*  --  164* 120* 181*  BUN 7  --  6 6 7   CREATININE 0.67  --  0.67 0.54 0.77  CALCIUM 8.8*  --  8.6* 8.7* 8.4*  MG  --  1.7  --  2.3  --   PHOS  --  2.3*  --   --   --    GFR: Estimated Creatinine Clearance: 79.7 mL/min (by C-G formula based on SCr of 0.77 mg/dL). Liver Function Tests: Recent Labs  Lab 11/17/17 1340  AST 28  ALT 18  ALKPHOS 61  BILITOT 0.7  PROT 7.1  ALBUMIN 3.6   Recent Labs  Lab 11/17/17 1340 11/20/17 0454  LIPASE 82* 33   No results for input(s): AMMONIA in the last 168 hours. Coagulation Profile: Recent Labs  Lab 11/17/17 1340  INR 1.05   Cardiac Enzymes: No results for input(s): CKTOTAL, CKMB, CKMBINDEX, TROPONINI in the last 168 hours. BNP (last 3 results) No results for input(s): PROBNP in the last 8760  hours. HbA1C: No results for input(s): HGBA1C in the last 72 hours. CBG: Recent Labs  Lab 11/20/17 2208 11/20/17 2338 11/21/17 0410 11/21/17 0754 11/21/17 1153  GLUCAP 157* 133* 120* 144* 120*   Lipid Profile: No results for input(s): CHOL, HDL, LDLCALC, TRIG, CHOLHDL, LDLDIRECT in the last 72 hours. Thyroid Function Tests: No results for input(s): TSH, T4TOTAL, FREET4, T3FREE, THYROIDAB in the last 72 hours. Anemia Panel: No results for input(s): VITAMINB12, FOLATE, FERRITIN, TIBC, IRON, RETICCTPCT in the last 72 hours. Urine analysis:    Component Value Date/Time   COLORURINE STRAW (A) 11/20/2017 2000   APPEARANCEUR CLEAR 11/20/2017 2000   APPEARANCEUR CLOUDY 12/19/2014 0730   LABSPEC 1.006 11/20/2017 2000   LABSPEC 1.025 12/19/2014 0730   PHURINE 8.0 11/20/2017 2000   GLUCOSEU 50 (A) 11/20/2017 2000   GLUCOSEU see comment 12/19/2014 0730   HGBUR NEGATIVE 11/20/2017 2000   BILIRUBINUR NEGATIVE 11/20/2017 2000   BILIRUBINUR n 05/08/2016 1506   BILIRUBINUR see comment 12/19/2014 0730   KETONESUR NEGATIVE 11/20/2017 2000   PROTEINUR NEGATIVE 11/20/2017 2000   UROBILINOGEN negative 05/08/2016 1506   UROBILINOGEN 1.0  11/30/2014 1215   NITRITE NEGATIVE 11/20/2017 2000   LEUKOCYTESUR LARGE (A) 11/20/2017 2000   LEUKOCYTESUR see comment 12/19/2014 0730   Sepsis Labs: @LABRCNTIP (procalcitonin:4,lacticidven:4)  ) Recent Results (from the past 240 hour(s))  Urine culture     Status: None   Collection Time: 11/17/17  2:16 PM  Result Value Ref Range Status   Specimen Description   Final    URINE, CLEAN CATCH Performed at [facility], [place] [address], [place], [place] [address]    Special Requests   Final    Normal Performed at [facility], [place] [address] [place], [place] [address]    Culture   Final    NO GROWTH Performed at [facility], [place] [address] [place], [place] [address]    Report Status 11/18/2017 FINAL  Final   Culture, Urine     Status: Abnormal (Preliminary result)   Collection Time: 11/20/17 11:53 AM  Result Value Ref Range Status   Specimen Description   Final    URINE, CLEAN CATCH Performed at [facility], [place] [address] [place], [place] [address]    Special Requests   Final    NONE Performed at [facility], [place] [address] [place], [place] [address]    Culture >=100,000 COLONIES/mL ENTEROCOCCUS FAECALIS (A)  Final   Report Status PENDING  Incomplete      Studies: Dg Swallowing Func-speech Pathology  Result Date: 11/20/2017 Objective Swallowing Evaluation: Type of Study: MBS-Modified Barium Swallow Study  Patient Details Name: MANIAH NADING MRN: [id number] Date of Birth: 26-Nov-1945 Today's Date: 11/20/2017 Time: SLP Start Time (ACUTE ONLY): 1310 -SLP Stop Time (ACUTE ONLY): 1326 SLP Time Calculation (min) (ACUTE ONLY): 16 min Past Medical History: Past Medical History: Diagnosis Date . Anemia  . Anxiety  . Bronchitis   hx of  . Clotting disorder ([place]) 02-17-14  S/p appendectomy-developed Pulmonary emboli-tx. warfarin-d/c 1 yr ago. . Colon polyps  . Depression  . Diabetes mellitus without complication ([place])   "borderline"- oral med . Disorder of vocal cords   spasmotic dysphonia ,02-17-14 has" whispery voice-low tone" . Hyperthyroidism  . Kidney stones  . Migraines  . Nausea Nov 15, 2014  cycle of migraine headaches and nausea . Neuromuscular disorder ([place])   hands and throat"spasmodic dysphonia", tremors - thimb and forefinger  . Obesity, Class III, BMI 40-49.9 (morbid obesity) ([place]) 04/27/2010  02-17-14 reports some weight loss- intentional . Osteoarthritis   in back(chronic pain) . Pelvic pain   02-17-14 "states thinks its back related on left groin" . Pulmonary emboli [facility])   s/p Appendectomy '13 [facility] . Reflux  . Right knee pain  . Sleep apnea   cpap - settings at 4  . Toxic goiter  Past Surgical History: Past Surgical History: Procedure Laterality Date . ABDOMINAL  HYSTERECTOMY   . APPENDECTOMY    3'13 [facility] s/p developed  Pulmonary Emboli . BOTOX INJECTION    for migranes . CATARACT EXTRACTION, BILATERAL Bilateral 03/2013  . CHOLECYSTECTOMY   . HAND SURGERY  2004  both hands . KIDNEY STONE SURGERY   . KNEE ARTHROSCOPY  1998  right . LAPAROSCOPIC APPENDECTOMY  11/27/2011  Procedure: APPENDECTOMY LAPAROSCOPIC;  Surgeon: Adin Hector, MD;  Location: [facility];  Service: General;  Laterality: N/A; . SPINAL CORD STIMULATOR IMPLANT  06/04/2016  Dr. Alfonse Ras at [facility] . TOTAL KNEE ARTHROPLASTY Right 12/06/2014  Procedure: RIGHT TOTAL KNEE ARTHROPLASTY;  Surgeon: Gaynelle Arabian, MD;  Location: [facility];  Service: Orthopedics;  Laterality: Right; . TOTAL KNEE REVISION Left 02/24/2014  Procedure: LEFT TOTAL KNEE REVISION;  Surgeon: Gearlean Alf, MD;  Location: [facility];  Service: Orthopedics;  Laterality: Left; . vocal cord surgery  02-17-14  01-12-14 done in [place] [facility](s/p selective denervation/reinnervation recurrent laryngeal nerve surgery) . zenkers diverticulum   HPI: 72 yo female with PMH significant for depression, DM2, COPD, CAD, hypothyroidism, essential tremor who presented to the ED after attempting suicide by ingesting 30 pills of percocet. Tachycardic on presentation. CT abd pelvis revealed suspicion for acute pancreatitis. Pt with hx of spasmodic dysphonia.  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 October 2018 exam by ENT revealed known abductor spasmodic dysphonia tremor and laryngeal candidiasis with purulent secretions covering the mucosa of the larynx and pharynx.  Pt was seen by Dr. Lake Bells from pulmonology in Nov 2018 for evaluation of cough.  He referred Mrs. Doorn for OP MBS due to concerns for potential silent aspiration; however, she missed her scheduled appointment in November because of her  husband's stroke.   Subjective: communicative, friendly Assessment / Plan / Recommendation CHL IP CLINICAL IMPRESSIONS 11/20/2017 Clinical Impression Pt presents with a normal oropharyngeal swallow with adequate mastication, timely swallow response, good pharyngeal clearance of POs, no penetration/aspiration, despite large consecutive thin liquid boluses.  Notable was retention of barium immediately inferior to cricopharyngeus - residue remained at least one minute after completion of study.  Pt viewed video and we discussed results.   Pt may benefit from further esophageal w/u at some point. Continue diet per pt's preferences; thin liquids; meds whole in puree.  No SLP f/u warranted.  SLP Visit Diagnosis Dysphagia, pharyngoesophageal phase (R13.14) Attention and concentration deficit following -- Frontal lobe and executive function deficit following -- Impact on safety and function No limitations   CHL IP TREATMENT RECOMMENDATION 11/20/2017 Treatment Recommendations No treatment recommended at this time   No flowsheet data found. CHL IP DIET RECOMMENDATION 11/20/2017 SLP Diet Recommendations Dysphagia 3 (Mech soft) solids;Thin liquid Liquid Administration via Cup;Straw Medication Administration Whole meds with liquid Compensations -- Postural Changes --   CHL IP OTHER RECOMMENDATIONS 11/20/2017 Recommended Consults Consider esophageal assessment Oral Care Recommendations -- Other Recommendations --   CHL IP FOLLOW UP RECOMMENDATIONS 11/20/2017 Follow up Recommendations None   CHL IP FREQUENCY AND DURATION 03/23/2016 Speech Therapy Frequency (ACUTE ONLY) min 2x/week Treatment Duration --      CHL IP ORAL PHASE 11/20/2017 Oral Phase WFL Oral - Pudding Teaspoon -- Oral - Pudding Cup -- Oral - Honey Teaspoon -- Oral - Honey Cup -- Oral - Nectar Teaspoon -- Oral - Nectar Cup -- Oral - Nectar Straw -- Oral - Thin Teaspoon -- Oral - Thin Cup -- Oral - Thin Straw -- Oral - Puree -- Oral - Mech Soft -- Oral - Regular -- Oral -  Multi-Consistency -- Oral - Pill -- Oral Phase - Comment --  CHL IP PHARYNGEAL PHASE 11/20/2017 Pharyngeal Phase WFL Pharyngeal- Pudding Teaspoon -- Pharyngeal -- Pharyngeal- Pudding Cup -- Pharyngeal -- Pharyngeal- Honey Teaspoon -- Pharyngeal -- Pharyngeal- Honey Cup -- Pharyngeal -- Pharyngeal- Nectar Teaspoon -- Pharyngeal -- Pharyngeal- Nectar Cup -- Pharyngeal -- Pharyngeal- Nectar Straw -- Pharyngeal -- Pharyngeal- Thin Teaspoon -- Pharyngeal -- Pharyngeal- Thin Cup -- Pharyngeal -- Pharyngeal- Thin Straw -- Pharyngeal -- Pharyngeal- Puree -- Pharyngeal -- Pharyngeal- Mechanical Soft -- Pharyngeal --  Pharyngeal- Regular -- Pharyngeal -- Pharyngeal- Multi-consistency -- Pharyngeal -- Pharyngeal- Pill -- Pharyngeal -- Pharyngeal Comment --  CHL IP CERVICAL ESOPHAGEAL PHASE 11/20/2017 Cervical Esophageal Phase Impaired Pudding Teaspoon -- Pudding Cup -- Honey Teaspoon -- Honey Cup -- Nectar Teaspoon -- Nectar Cup -- Nectar Straw -- Thin Teaspoon -- Thin Cup -- Thin Straw -- Puree -- Mechanical Soft -- Regular -- Multi-consistency -- Pill -- Cervical Esophageal Comment retention of barium directly inferior to PES CHL IP GO 03/23/2016 Functional Assessment Tool Used NOMS Functional Limitations Spoken language expressive Swallow Current Status [phone]) (None) Swallow Goal Status (O8416) (None) Swallow Discharge Status (S0630) (None) Motor Speech Current Status (Z6010) (None) Motor Speech Goal Status (X3235) (None) Motor Speech Goal Status (T7322) (None) Spoken Language Comprehension Current Status (G2542) (None) Spoken Language Comprehension Goal Status (H0623) (None) Spoken Language Comprehension Discharge Status [phone]) (None) Spoken Language Expression Current Status (T5176) CJ Spoken Language Expression Goal Status (H6073) CI Spoken Language Expression Discharge Status [phone]) (None) Attention Current Status (I9485) (None) Attention Goal Status (I6270) (None) Attention Discharge Status (J5009) (None) Memory Current  Status (F8182) (None) Memory Goal Status (X9371) (None) Memory Discharge Status (I9678) (None) Voice Current Status (L3810) (None) Voice Goal Status (F7510) (None) Voice Discharge Status (C5852) (None) Other Speech-Language Pathology Functional Limitation Current Status (D7824) (None) Other Speech-Language Pathology Functional Limitation Goal Status (M3536) (None) Other Speech-Language Pathology Functional Limitation Discharge Status [phone]) (None) Juan Quam Laurice 11/20/2017, 1:42 PM               Scheduled Meds: . apixaban  2.5 mg Oral BID  . buPROPion  300 mg Oral Daily  . docusate sodium  100 mg Oral BID  . gabapentin  600 mg Oral TID  . guaiFENesin  1,200 mg Oral BID  . insulin aspart  0-15 Units Subcutaneous Q4H  . insulin glargine  10 Units Subcutaneous QHS  . levofloxacin  500 mg Oral Q24H  . levothyroxine  25 mcg Oral QAC breakfast  . mometasone-formoterol  2 puff Inhalation BID  . senna  1 tablet Oral BID    Continuous Infusions: . lactated ringers 150 mL/hr (11/21/17 0017)     LOS: 4 days   Deatra James, MD [facility] Pager [phone]  If 7PM-7AM, please contact night-coverage [email or web address] Password [email or web address] 11/21/2017, 1:30 PM

## 2017-11-22 LAB — URINE CULTURE: Culture: >=100000 — AB

## 2017-11-22 LAB — GLUCOSE, CAPILLARY
Glucose-Capillary: 131 mg/dL — ABNORMAL HIGH (ref 65–99)
Glucose-Capillary: 165 mg/dL — ABNORMAL HIGH (ref 65–99)
Glucose-Capillary: 204 mg/dL — ABNORMAL HIGH (ref 65–99)
Glucose-Capillary: 206 mg/dL — ABNORMAL HIGH (ref 65–99)
Glucose-Capillary: 212 mg/dL — ABNORMAL HIGH (ref 65–99)
Glucose-Capillary: 269 mg/dL — ABNORMAL HIGH (ref 65–99)

## 2017-11-22 MED ORDER — BACID PO TABS: 2.0000 | ORAL_TABLET | Freq: Three times a day (TID) | ORAL | Status: DC

## 2017-11-22 MED ORDER — LORAZEPAM 1 MG PO TABS
1.0000 mg | ORAL_TABLET | Freq: Once | ORAL | Status: AC
  Administered 2017-11-22: 1 mg via ORAL
  Filled 2017-11-22: qty 1

## 2017-11-22 MED ORDER — RISAQUAD PO CAPS
1.0000 | ORAL_CAPSULE | Freq: Every day | ORAL | Status: DC
  Administered 2017-11-22 – 2017-12-02 (×11): 1 via ORAL
  Filled 2017-11-22 (×11): qty 1

## 2017-11-22 NOTE — Progress Notes (Signed)
Hayley [name]    PROGRESS NOTE  Hayley [name] OHY:[id number] DOB: [birth year] DOA: 11/17/2017 PCP: [facility]  HPI/Recap of past 24 hours: [name] is a 72 yo female with PMH significant for depression, DM2, COPD, CAD, hypothyroidism, essential tremor who presented to the ED after attempting suicide by ingesting 30 pills of percocet. Tachycardic on presentation. CT abd pelvis revealed suspicion for acute pancreatitis. Lipase 82. Acetaminophen level <10.  11/18/17: continues to have suicidal ideation and feel hopeless, helpless. psychiatry consulted and recommended psych inpatient admission. Reports persistent nausea with no vomiting and moderate abdominal discomfort when she eats.  11/19/17: cxr ordered due to hypoxia. Abd xray done due to abdominal pain. Chest xray revealed atelectasis as reviewed by self. No sign of ileus on abd xray. Nausea is improved. Reports loose stools.  11/20/17 -patient was seen and examined this morning, stable.  Complaint of headaches, low-grade fever and chills.  Nonproductive cough but denies any dysuria. Daughter present at bedside, no other complaints. No issues overnight  11/21/17-patient was seen and examined this morning, reporting that she is feeling much better.  She is in good spirits this morning, accompanied by family No issues overnight, but still reporting she feels warm as not know if she had a fever overnight. Temp overnight 99.2  11/22/17 -patient was seen and examined this morning.  Stable reporting of improved mood.  Denies any fever chills, reporting of constipation.  --------------------------------------------------------------------------------------------------------------------------  Assessment/Plan: Principal Problem:   MDD (major depressive episode), single episode, severe, no psychosis ([place]) Active Problems:   Pancreatitis   Suicidal attempt/severe depression -Stable, improved mood, denies any suicidal homicidal  ideation  -acetaminophen level <10 -ingestion of 30 pills of percocet -Psychiatry was consulted, following accordingly -recommends [facility] facility admission -social work consulted -pending geriatric psych discharge  Fever with T-max of 100.5, this morning 98.5 -Improved headaches -UA revealed leukocyte esterase, urine culture positive for enterococcus faecalis, sensitive to quinolone -Chest x-ray within normal limits -We will continue with p.o. Antibiotics for 2 more day.  Mild Acute pancreatitis -clear liquid diet - advancing diet as tolerated -Continue as needed Iv zofran for nausea -avoid narcotic for pain management  Acute hypoxia, improving -cxr revealed atelectasis no infiltrates as reviewed  -continue nebs  Loose stools -Per patient,  improving -abd xray unremarkable for any acute findings -Complain of constipation  Hx of DVT -continue eliquis  DM2 -continue ISS  Undocumented COPD -continue breathing treatments -continue O2 supp to maintain O2 sat 92% or greater  Polyneuropathy -continue gabapentin  Essential tremor -on propranolol -stooped due to hypotension  Hypotension, improving  Morbid obesity -weight loss in outpatient when more stable  Hypothyroidism -continue synthroid  Hypokalemia, resolved  Hypomagnesemia -mag 1.7 -repleted   Dysphagia -Status post speech evaluation, recommending dysphagia 3 mechanical soft solids, thin liquids May use cup straw, hold meds with liquids,   Code Status: full  Family Communication: daughter at bedside   Disposition Plan: to be admitted at [facility] psych facility possibly tomorrow if bed available Pending bed availability   Consultants:  Psychiatry  Social worker  Procedures: None  Antimicrobials:  none  DVT prophylaxis:  eliquis   Objective: Vitals:   11/21/17 2023 11/21/17 2117 11/22/17 0551 11/22/17 1020  BP:  124/75 106/85   Pulse:  94 98   Resp:  19 (!) 22   Temp:  98.1  F (36.7 C) 98.5 F (36.9 C)   TempSrc:  Oral Oral   SpO2: 99% 95% 96%  Weight:    110 kg (242 lb 8.1 oz)  Height:        Intake/Output Summary (Last 24 hours) at 11/22/2017 1044 Last data filed at 11/22/2017 0909 Gross per 24 hour  Intake 2000 ml  Output 3750 ml  Net -1750 ml   Filed Weights   11/20/17 0500 11/21/17 0517 11/22/17 1020  Weight: 108.2 kg (238 lb 8 oz) 109.6 kg (241 lb 10 oz) 110 kg (242 lb 8.1 oz)    Exam: 11/19/17 patient seen and examined. Physical exam unchanged from yesterday.  BP 106/85 (BP Location: Right Arm)   Pulse 98   Temp 98.5 F (36.9 C) (Oral)   Resp (!) 22   Ht 5\' 6"  (1.676 m)   Wt 110 kg (242 lb 8.1 oz)   LMP 09/18/1979 (Approximate)   SpO2 96%   BMI 39.14 kg/m    Physical Exam  Constitution:  Alert, cooperative, no distress,  Psychiatric: Normal and stable mood and affect, cognition intact,   HEENT: Normocephalic, PERRL, otherwise with in Normal limits  Cardio vascular:  S1/S2, RRR, No murmure, No Rubs or Gallops  Chest/pulmonary: Clear to auscultation bilaterally, respirations unlabored  Chest symmetric Abdomen: Soft, non-tender, non-distended, bowel sounds,no masses, no organomegaly Muscular skeletal: Limited exam - in bed, able to move all 4 extremities, Normal strength,  Neuro: CNII-XII intact. , normal motor and sensation, reflexes intact  Extremities: No pitting edema lower extremities, +2 pulses  Skin: Dry, warm to touch, negative for any Rashes, No open wounds       Data Reviewed: CBC: Recent Labs  Lab 11/17/17 1340 11/18/17 0508 11/20/17 1400  WBC 10.0 8.6 6.8  NEUTROABS  --   --  4.8  HGB 14.9 14.7 12.6  HCT 45.6 45.9 39.7  MCV 92.7 91.4 94.3  PLT 247 210 829   Basic Metabolic Panel: Recent Labs  Lab 11/17/17 1340 11/17/17 2213 11/18/17 0508 11/20/17 0454 11/20/17 1400  NA 137  --  141 143 138  K 3.3*  --  3.8 4.1 3.7  CL 99*  --  103 106 102  CO2 29  --  30 30 28   GLUCOSE 207*  --  164* 120* 181*   BUN 7  --  6 6 7   CREATININE 0.67  --  0.67 0.54 0.77  CALCIUM 8.8*  --  8.6* 8.7* 8.4*  MG  --  1.7  --  2.3  --   PHOS  --  2.3*  --   --   --    GFR: Estimated Creatinine Clearance: 79.9 mL/min (by C-G formula based on SCr of 0.77 mg/dL). Liver Function Tests: Recent Labs  Lab 11/17/17 1340  AST 28  ALT 18  ALKPHOS 61  BILITOT 0.7  PROT 7.1  ALBUMIN 3.6   Recent Labs  Lab 11/17/17 1340 11/20/17 0454  LIPASE 82* 33   No results for input(s): AMMONIA in the last 168 hours. Coagulation Profile: Recent Labs  Lab 11/17/17 1340  INR 1.05   Cardiac Enzymes: No results for input(s): CKTOTAL, CKMB, CKMBINDEX, TROPONINI in the last 168 hours. BNP (last 3 results) No results for input(s): PROBNP in the last 8760 hours. HbA1C: No results for input(s): HGBA1C in the last 72 hours. CBG: Recent Labs  Lab 11/21/17 1619 11/21/17 1958 11/22/17 0001 11/22/17 0409 11/22/17 0726  GLUCAP 259* 205* 212* 165* 206*   Urine analysis:    Component Value Date/Time   COLORURINE STRAW (A) 11/20/2017 2000   APPEARANCEUR CLEAR  11/20/2017 2000   APPEARANCEUR CLOUDY 12/19/2014 0730   LABSPEC 1.006 11/20/2017 2000   LABSPEC 1.025 12/19/2014 0730   PHURINE 8.0 11/20/2017 2000   GLUCOSEU 50 (A) 11/20/2017 2000   GLUCOSEU see comment 12/19/2014 0730   HGBUR NEGATIVE 11/20/2017 [place] NEGATIVE 11/20/2017 2000   BILIRUBINUR n 05/08/2016 1506   BILIRUBINUR see comment 12/19/2014 0730   KETONESUR NEGATIVE 11/20/2017 2000   PROTEINUR NEGATIVE 11/20/2017 2000   UROBILINOGEN negative 05/08/2016 1506   UROBILINOGEN 1.0 11/30/2014 1215   NITRITE NEGATIVE 11/20/2017 2000   LEUKOCYTESUR LARGE (A) 11/20/2017 2000   LEUKOCYTESUR see comment 12/19/2014 0730   Sepsis Labs: @LABRCNTIP (procalcitonin:4,lacticidven:4)     Studies: No results found.  Scheduled Meds: . acidophilus  1 capsule Oral Daily  . apixaban  2.5 mg Oral BID  . buPROPion  300 mg Oral Daily  . gabapentin   600 mg Oral TID  . guaiFENesin  1,200 mg Oral BID  . insulin aspart  0-15 Units Subcutaneous Q4H  . insulin glargine  10 Units Subcutaneous QHS  . levofloxacin  500 mg Oral Q24H  . levothyroxine  25 mcg Oral QAC breakfast  . LORazepam  1 mg Oral Once  . mometasone-formoterol  2 puff Inhalation BID    Continuous Infusions:    LOS: 5 days   Deatra James, MD [facility] Pager [phone]  If 7PM-7AM, please contact night-coverage [email or web address] Password [facility] 11/22/2017, 10:44 AM

## 2017-11-22 NOTE — Progress Notes (Signed)
   11/22/17 1400  Clinical Encounter Type  Visited With Patient not available  Visit Type Initial;Psychological support;Spiritual support  Referral From Nurse  Consult/Referral To Chaplain  Spiritual Encounters  Spiritual Needs Emotional;Other (Comment) (Spiritual Care Conversation/Support)  Stress Factors  Patient Stress Factors Not reviewed   Patient was on the phone at the time of my visit. I will follow-up at a better time.   Please, contact Spiritual Care for further assistance.  Chaplain Shanon Ace M.Div., [facility]

## 2017-11-22 NOTE — Progress Notes (Signed)
Pt states that she will call when ready for CPAP.  RT to monitor and assess as needed.  

## 2017-11-23 LAB — GLUCOSE, CAPILLARY
Glucose-Capillary: 141 mg/dL — ABNORMAL HIGH (ref 65–99)
Glucose-Capillary: 324 mg/dL — ABNORMAL HIGH (ref 65–99)
Glucose-Capillary: 118 mg/dL — ABNORMAL HIGH (ref 65–99)
Glucose-Capillary: 176 mg/dL — ABNORMAL HIGH (ref 65–99)
Glucose-Capillary: 137 mg/dL — ABNORMAL HIGH (ref 65–99)
Glucose-Capillary: 241 mg/dL — ABNORMAL HIGH (ref 65–99)
Glucose-Capillary: 264 mg/dL — ABNORMAL HIGH (ref 65–99)

## 2017-11-23 MED ORDER — HYDROCOD POLST-CPM POLST ER 10-8 MG/5ML PO SUER
5.0000 mL | Freq: Once | ORAL | Status: AC
  Administered 2017-11-23: 5 mL via ORAL
  Filled 2017-11-23: qty 5

## 2017-11-23 MED ORDER — BISACODYL 5 MG PO TBEC
5.0000 mg | DELAYED_RELEASE_TABLET | Freq: Every day | ORAL | Status: DC | PRN
  Administered 2017-11-27: 5 mg via ORAL
  Filled 2017-11-23: qty 1

## 2017-11-23 MED ORDER — INSULIN ASPART 100 UNIT/ML ~~LOC~~ SOLN
0.0000 [IU] | Freq: Three times a day (TID) | SUBCUTANEOUS | Status: DC
  Administered 2017-11-24: 5 [IU] via SUBCUTANEOUS
  Administered 2017-11-24 – 2017-11-28 (×12): 3 [IU] via SUBCUTANEOUS
  Administered 2017-11-28: 2 [IU] via SUBCUTANEOUS
  Administered 2017-11-28: 3 [IU] via SUBCUTANEOUS
  Administered 2017-11-29 (×3): 2 [IU] via SUBCUTANEOUS
  Administered 2017-11-30: 3 [IU] via SUBCUTANEOUS
  Administered 2017-11-30: 2 [IU] via SUBCUTANEOUS
  Administered 2017-11-30 – 2017-12-01 (×2): 3 [IU] via SUBCUTANEOUS
  Administered 2017-12-01: 8 [IU] via SUBCUTANEOUS
  Administered 2017-12-01 – 2017-12-02 (×2): 2 [IU] via SUBCUTANEOUS
  Administered 2017-12-02: 5 [IU] via SUBCUTANEOUS

## 2017-11-23 MED ORDER — SENNA 8.6 MG PO TABS
1.0000 | ORAL_TABLET | Freq: Every day | ORAL | Status: DC
  Administered 2017-11-23 – 2017-11-30 (×7): 8.6 mg via ORAL
  Filled 2017-11-23 (×8): qty 1

## 2017-11-23 NOTE — Progress Notes (Signed)
Paged on call for [facility] about pt VS, next set retrieved stable.   Paged on call for [facility]ad pt requesting tussonex for cough. Received orders.   Pt anxious about wanting to have a BM prior to DC stating she will feel uncomfortable leaving without having one. Pt becoming upset with the nurse for informing her that her colace and sennakot were discontinued early on dayshift. Pt given miralax prn at bedtime but patient states all that works for her is senna and colace. On call paged about concerns.

## 2017-11-23 NOTE — Progress Notes (Addendum)
LCSW following for [facility] psych placement.  LCSW faxed patient out to the following facilities.  [facility]- No beds 3/9, possible dc tomorrow.  [facility]- No beds avaiable  [facility]- per Delana Meyer denied- pt too medical acuity (names pancreatitis and hx of SBO)  [facility]- 3/9 declined due to medical acuity.   [facility]- declined due to high acuity.   [facility]- left [facility]- stated out of catchment area  [facility]- 3/9 Full  [facility]-

## 2017-11-23 NOTE — Progress Notes (Signed)
PROGRESS NOTE [facility]   Hayley [name]   JEH:[id number] DOB: 08/04/1946  DOA: 11/17/2017 PCP: [facility]   Brief Narrative:  Hayley [name] is a 72 year old female with past medical history of depression, diabetes type 2, COPD, CAD, hypothyroidism who presented to the emergency department after attempting suicide by ingesting 30 pills of Percocet.  Upon ED evaluation CT of the abdomen and pelvis revealed suspicion of acute pancreatitis and patient was admitted for working diagnosis of pancreatitis and psychiatric evaluation.  Psych evaluated patient recommended geriatric inpatient psych admission.  Pancreatitis has resolved, and now tolerating diet well.  Pending bed availability for inpatient psych.  Subjective: Patient seen and examined, she complains of constipation.  No other concerns.  Denies SI/HI   Assessment & Plan: Suicidal attempt in view of severe depression Intentional overdose with Percocet 30 pills Acetaminophen levels <10 Psychiatry was consulted and recommended inpatient admission at  geriatric psychiatric institution Continue follow-up recommendations Social worker will follow with discharge arrangements.  Mild acute pancreatitis Now tolerating diet Advance as tolerated Continue supportive treatment  UTI Urine culture positive for Enterococcus faecalis sensitive to quinolones Continue antibiotics  Constipation Tap water enema Add senna and Dulcolax  Peripheral neuropathy Continue gabapentin  Essential tremor Also metoprolol but this has been on hold due to hypotension We will continue to monitor  Diabetes mellitus type 2 Holding oral hypoglycemic Continue SSI CBGs acceptable during hospital stay  Dysphagia Continue mechanical soft diet   DVT prophylaxis: Eliquis Code Status: Full code Family Communication: Family at bedside Disposition Plan: Patient medically stable awaiting geriatric psych facility for  discharge.  Consultants:   Psychiatry  Procedures:   None  Antimicrobials:  None   Objective: Vitals:   11/23/17 0300 11/23/17 0525 11/23/17 0529 11/23/17 0817  BP:   (!) 148/86   Pulse:   91   Resp: 20  20   Temp:   98.2 F (36.8 C)   TempSrc:   Oral   SpO2:   92% 90%  Weight:  107.3 kg (236 lb 8.9 oz)    Height:        Intake/Output Summary (Last 24 hours) at 11/23/2017 1242 Last data filed at 11/23/2017 0839 Gross per 24 hour  Intake 590 ml  Output 1975 ml  Net -1385 ml   Filed Weights   11/21/17 0517 11/22/17 1020 11/23/17 0525  Weight: 109.6 kg (241 lb 10 oz) 110 kg (242 lb 8.1 oz) 107.3 kg (236 lb 8.9 oz)    Examination:  General exam: Appears calm and comfortable  Respiratory system: Clear to auscultation. No wheezes,crackle or rhonchi Cardiovascular system: S1 & S2 heard, RRR. No JVD, murmurs, rubs or gallops Gastrointestinal system: Abdomen is nondistended, soft and nontender.  Central nervous system: Alert and oriented. No focal neurological deficits. Extremities: No pedal edema.  Skin: No rashes, lesions or ulcers Psychiatry: Tangential, flight of ideas.  No hallucinations or suicidal ideas   Data Reviewed: I have personally reviewed following labs and imaging studies  CBC: Recent Labs  Lab 11/17/17 1340 11/18/17 0508 11/20/17 1400  WBC 10.0 8.6 6.8  NEUTROABS  --   --  4.8  HGB 14.9 14.7 12.6  HCT 45.6 45.9 39.7  MCV 92.7 91.4 94.3  PLT 247 210 378   Basic Metabolic Panel: Recent Labs  Lab 11/17/17 1340 11/17/17 2213 11/18/17 0508 11/20/17 0454 11/20/17 1400  NA 137  --  141 143 138  K 3.3*  --  3.8 4.1  3.7  CL 99*  --  103 106 102  CO2 29  --  30 30 28   GLUCOSE 207*  --  164* 120* 181*  BUN 7  --  6 6 7   CREATININE 0.67  --  0.67 0.54 0.77  CALCIUM 8.8*  --  8.6* 8.7* 8.4*  MG  --  1.7  --  2.3  --   PHOS  --  2.3*  --   --   --    GFR: Estimated Creatinine Clearance: 78.8 mL/min (by C-G formula based on SCr of 0.77  mg/dL). Liver Function Tests: Recent Labs  Lab 11/17/17 1340  AST 28  ALT 18  ALKPHOS 61  BILITOT 0.7  PROT 7.1  ALBUMIN 3.6   Recent Labs  Lab 11/17/17 1340 11/20/17 0454  LIPASE 82* 33   No results for input(s): AMMONIA in the last 168 hours. Coagulation Profile: Recent Labs  Lab 11/17/17 1340  INR 1.05   Cardiac Enzymes: No results for input(s): CKTOTAL, CKMB, CKMBINDEX, TROPONINI in the last 168 hours. BNP (last 3 results) No results for input(s): PROBNP in the last 8760 hours. HbA1C: No results for input(s): HGBA1C in the last 72 hours. CBG: Recent Labs  Lab 11/22/17 2002 11/23/17 0028 11/23/17 0400 11/23/17 0723 11/23/17 1200  GLUCAP 131* 324* 141* 118* 176*   Lipid Profile: No results for input(s): CHOL, HDL, LDLCALC, TRIG, CHOLHDL, LDLDIRECT in the last 72 hours. Thyroid Function Tests: No results for input(s): TSH, T4TOTAL, FREET4, T3FREE, THYROIDAB in the last 72 hours. Anemia Panel: No results for input(s): VITAMINB12, FOLATE, FERRITIN, TIBC, IRON, RETICCTPCT in the last 72 hours. Sepsis Labs: Recent Labs  Lab 11/17/17 1417  LATICACIDVEN 1.56    Recent Results (from the past 240 hour(s))  Urine culture     Status: None   Collection Time: 11/17/17  2:16 PM  Result Value Ref Range Status   Specimen Description   Final    URINE, CLEAN CATCH Performed at [facility] [address] [place], [place] [address]    Special Requests   Final    Normal Performed at [facility], [place] [address], [place], [place] [address]    Culture   Final    NO GROWTH Performed at [facility], [place] [address], [place], [place] [address]    Report Status 11/18/2017 FINAL  Final  Culture, Urine     Status: Abnormal   Collection Time: 11/20/17 11:53 AM  Result Value Ref Range Status   Specimen Description   Final    URINE, CLEAN CATCH Performed at [facility], [place] [address] [place],  [place] [address]    Special Requests   Final    NONE Performed at [facility], [place] [address], [place], [place] [address]    Culture >=100,000 COLONIES/mL ENTEROCOCCUS FAECALIS (A)  Final   Report Status 11/22/2017 FINAL  Final   Organism ID, Bacteria ENTEROCOCCUS FAECALIS (A)  Final      Susceptibility   Enterococcus faecalis - MIC*    AMPICILLIN <=2 SENSITIVE Sensitive     LEVOFLOXACIN 1 SENSITIVE Sensitive     NITROFURANTOIN <=16 SENSITIVE Sensitive     VANCOMYCIN 1 SENSITIVE Sensitive     * >=100,000 COLONIES/mL ENTEROCOCCUS FAECALIS      Radiology Studies: No results found.    Scheduled Meds: . acidophilus  1 capsule Oral Daily  . apixaban  2.5 mg Oral BID  . buPROPion  300 mg Oral Daily  . gabapentin  600 mg Oral TID  . guaiFENesin  1,200 mg Oral BID  . insulin aspart  0-15 Units Subcutaneous Q4H  . insulin glargine  10 Units Subcutaneous QHS  . levothyroxine  25 mcg Oral QAC breakfast  . mometasone-formoterol  2 puff Inhalation BID  . senna  1 tablet Oral QHS   Continuous Infusions:   LOS: 6 days    Time spent: Total of 25 minutes spent with pt, greater than 50% of which was spent in discussion of  treatment, counseling and coordination of care    Chipper Oman, MD Pager: Text Page via [email or web address]   If 7PM-7AM, please contact night-coverage [email or web address] 11/23/2017, 12:42 PM   Note - This record has been created using [employer]. Chart creation errors have been sought, but may not always have been located. Such creation errors do not reflect on the standard of medical care.

## 2017-11-23 NOTE — Progress Notes (Signed)
Pt states that she will call when ready for CPAP.  RT to monitor and assess as needed.  

## 2017-11-24 LAB — GLUCOSE, CAPILLARY
Glucose-Capillary: 186 mg/dL — ABNORMAL HIGH (ref 65–99)
Glucose-Capillary: 241 mg/dL — ABNORMAL HIGH (ref 65–99)
Glucose-Capillary: 158 mg/dL — ABNORMAL HIGH (ref 65–99)
Glucose-Capillary: 127 mg/dL — ABNORMAL HIGH (ref 65–99)

## 2017-11-24 MED ORDER — PROPRANOLOL HCL ER 60 MG PO CP24
60.0000 mg | ORAL_CAPSULE | Freq: Every day | ORAL | Status: DC
  Administered 2017-11-24 – 2017-11-28 (×4): 60 mg via ORAL
  Filled 2017-11-24 (×6): qty 1

## 2017-11-24 NOTE — Progress Notes (Signed)
Pt states that she will call when ready for CPAP.  RT to monitor and assess as needed.  

## 2017-11-24 NOTE — Progress Notes (Signed)
PROGRESS NOTE [facility]   [name]   EHM:[id number] DOB: 28-Apr-1946  DOA: 11/17/2017 PCP: [facility]   Brief Narrative:  Hayley [name] is a 72 year old female with past medical history of depression, diabetes type 2, COPD, CAD, hypothyroidism who presented to the emergency department after attempting suicide by ingesting 30 pills of Percocet.  Upon ED evaluation CT of the abdomen and pelvis revealed suspicion of acute pancreatitis and patient was admitted for working diagnosis of pancreatitis and psychiatric evaluation.  Psych evaluated patient recommended geriatric inpatient psych admission.  Pancreatitis has resolved, and now tolerating diet well.  Pending bed availability for inpatient psych.  Subjective: No new complaints, she had a bowel movement yesterday and today.  Continues to await placement for inpatient psych patient is medically stable  Assessment & Plan: Suicidal attempt in view of severe depression Intentional overdose with Percocet 30 pills Acetaminophen levels <10 Psychiatry was consulted and recommended inpatient admission at  geriatric psychiatric institution Continue follow-up recommendations Continues to await for inpatient psych bed  Mild acute pancreatitis - resolved Tolerating diet, continue supportive treatment  UTI - completed treatment Urine culture positive for Enterococcus faecalis sensitive to quinolones Completed course of antibiotic with Levaquin and Cipro for total of 3 days  Constipation - resolved Treated with tap water enema Continue bowel regimen with Dulcolax, Senokot and MiraLAX  Peripheral neuropathy Continue gabapentin  Essential tremor - chronic Propanolol initially on hold due to hypotension, blood pressure has stabilized will resume Inderal 60 mg daily for now.  Continue to monitor  Diabetes mellitus type 2 - CBGs stable Holding oral hypoglycemic Continue SSI  Dysphagia Continue mechanical soft  diet  DVT prophylaxis: Eliquis Code Status: Full code Family Communication: None at bedside Disposition Plan: Patient medically stable she is awaiting of inpatient psych availability  Consultants:   Psychiatry  Procedures:   None  Antimicrobials: Anti-infectives (From admission, onward)   Start     Dose/Rate Route Frequency Ordered Stop   11/20/17 1400  levofloxacin (LEVAQUIN) tablet 500 mg     500 mg Oral Every 24 hours 11/20/17 1335 11/22/17 1307   11/20/17 1345  ciprofloxacin (CIPRO) tablet 500 mg  Status:  Discontinued     500 mg Oral 2 times daily 11/20/17 1335 11/20/17 1335        Objective: Vitals:   11/24/17 0330 11/24/17 0645 11/24/17 0844 11/24/17 1500  BP:  132/74  117/80  Pulse:  100  (!) 102  Resp: 18 19  18   Temp:  99.1 F (37.3 C)  99.3 F (37.4 C)  TempSrc:  Oral  Oral  SpO2:   90% 91%  Weight:      Height:        Intake/Output Summary (Last 24 hours) at 11/24/2017 1611 Last data filed at 11/24/2017 1053 Gross per 24 hour  Intake 360 ml  Output 2500 ml  Net -2140 ml   Filed Weights   11/21/17 0517 11/22/17 1020 11/23/17 0525  Weight: 109.6 kg (241 lb 10 oz) 110 kg (242 lb 8.1 oz) 107.3 kg (236 lb 8.9 oz)    Examination:  General: NAD Cardiovascular: RRR, S1/S2 +, no rubs, no gallops Respiratory: CTA bilaterally, no wheezing, no rhonchi Abdominal: Soft, NT, ND, bowel sounds + Extremities: no edema Psychiatry: No SI/HI, and tangential  Data Reviewed: I have personally reviewed following labs and imaging studies  CBC: Recent Labs  Lab 11/18/17 0508 11/20/17 1400  WBC 8.6 6.8  NEUTROABS  --  4.8  HGB 14.7 12.6  HCT 45.9 39.7  MCV 91.4 94.3  PLT 210 213   Basic Metabolic Panel: Recent Labs  Lab 11/17/17 2213 11/18/17 0508 11/20/17 0454 11/20/17 1400  NA  --  141 143 138  K  --  3.8 4.1 3.7  CL  --  103 106 102  CO2  --  30 30 28   GLUCOSE  --  164* 120* 181*  BUN  --  6 6 7   CREATININE  --  0.67 0.54 0.77  CALCIUM  --   8.6* 8.7* 8.4*  MG 1.7  --  2.3  --   PHOS 2.3*  --   --   --    GFR: Estimated Creatinine Clearance: 78.8 mL/min (by C-G formula based on SCr of 0.77 mg/dL). Liver Function Tests: No results for input(s): AST, ALT, ALKPHOS, BILITOT, PROT, ALBUMIN in the last 168 hours. Recent Labs  Lab 11/20/17 0454  LIPASE 33   No results for input(s): AMMONIA in the last 168 hours. Coagulation Profile: No results for input(s): INR, PROTIME in the last 168 hours. Cardiac Enzymes: No results for input(s): CKTOTAL, CKMB, CKMBINDEX, TROPONINI in the last 168 hours. BNP (last 3 results) No results for input(s): PROBNP in the last 8760 hours. HbA1C: No results for input(s): HGBA1C in the last 72 hours. CBG: Recent Labs  Lab 11/23/17 1631 11/23/17 1952 11/23/17 2214 11/24/17 0726 11/24/17 1215  GLUCAP 137* 241* 264* 186* 241*   Lipid Profile: No results for input(s): CHOL, HDL, LDLCALC, TRIG, CHOLHDL, LDLDIRECT in the last 72 hours. Thyroid Function Tests: No results for input(s): TSH, T4TOTAL, FREET4, T3FREE, THYROIDAB in the last 72 hours. Anemia Panel: No results for input(s): VITAMINB12, FOLATE, FERRITIN, TIBC, IRON, RETICCTPCT in the last 72 hours. Sepsis Labs: No results for input(s): PROCALCITON, LATICACIDVEN in the last 168 hours.  Recent Results (from the past 240 hour(s))  Urine culture     Status: None   Collection Time: 11/17/17  2:16 PM  Result Value Ref Range Status   Specimen Description   Final    URINE, CLEAN CATCH Performed at [facility] [address] [place], [place] [address]    Special Requests   Final    Normal Performed at [facility], [place] [address], [place], [place] [address]    Culture   Final    NO GROWTH Performed at [facility], [place] [address] [place], [place] [address]    Report Status 11/18/2017 FINAL  Final  Culture, Urine     Status: Abnormal   Collection Time: 11/20/17 11:53 AM  Result Value  Ref Range Status   Specimen Description   Final    URINE, CLEAN CATCH Performed at [facility], [place] [address], [place], [place] [address]    Special Requests   Final    NONE Performed at [facility], [place] [address] [place], [place] [address]    Culture >=100,000 COLONIES/mL ENTEROCOCCUS FAECALIS (A)  Final   Report Status 11/22/2017 FINAL  Final   Organism ID, Bacteria ENTEROCOCCUS FAECALIS (A)  Final      Susceptibility   Enterococcus faecalis - MIC*    AMPICILLIN <=2 SENSITIVE Sensitive     LEVOFLOXACIN 1 SENSITIVE Sensitive     NITROFURANTOIN <=16 SENSITIVE Sensitive     VANCOMYCIN 1 SENSITIVE Sensitive     * >=100,000 COLONIES/mL ENTEROCOCCUS FAECALIS      Radiology Studies: No results found.  Scheduled Meds: . acidophilus  1 capsule Oral Daily  . apixaban  2.5 mg Oral BID  . buPROPion  300 mg Oral Daily  . gabapentin  600 mg Oral TID  . guaiFENesin  1,200 mg Oral BID  . insulin aspart  0-15 Units Subcutaneous TID WC  . insulin glargine  10 Units Subcutaneous QHS  . levothyroxine  25 mcg Oral QAC breakfast  . mometasone-formoterol  2 puff Inhalation BID  . senna  1 tablet Oral QHS   Continuous Infusions:   LOS: 7 days   Time spent: Total of 15 minutes spent with pt, greater than 50% of which was spent in discussion of  treatment, counseling and coordination of care  Chipper Oman, MD Pager: Text Page via [email or web address]   If 7PM-7AM, please contact night-coverage [email or web address] 11/24/2017, 4:11 PM   Note - This record has been created using [employer]. Chart creation errors have been sought, but may not always have been located. Such creation errors do not reflect on the standard of medical care.

## 2017-11-24 NOTE — Progress Notes (Signed)
Called Meghan about placement for pt, she stated she was waiting to hear from [facility] and would keep [place] updated.

## 2017-11-25 LAB — GLUCOSE, CAPILLARY
Glucose-Capillary: 182 mg/dL — ABNORMAL HIGH (ref 65–99)
Glucose-Capillary: 177 mg/dL — ABNORMAL HIGH (ref 65–99)
Glucose-Capillary: 158 mg/dL — ABNORMAL HIGH (ref 65–99)
Glucose-Capillary: 235 mg/dL — ABNORMAL HIGH (ref 65–99)

## 2017-11-25 MED ORDER — DIPHENHYDRAMINE HCL 25 MG PO CAPS
50.0000 mg | ORAL_CAPSULE | Freq: Once | ORAL | Status: AC
  Administered 2017-11-26: 50 mg via ORAL
  Filled 2017-11-25: qty 2

## 2017-11-25 MED ORDER — MAGIC MOUTHWASH W/LIDOCAINE
5.0000 mL | Freq: Three times a day (TID) | ORAL | Status: DC | PRN
  Administered 2017-11-25 – 2017-11-26 (×3): 5 mL via ORAL
  Filled 2017-11-25 (×4): qty 5

## 2017-11-25 MED ORDER — DIPHENHYDRAMINE HCL 25 MG PO CAPS: 25.0000 mg | ORAL_CAPSULE | Freq: Four times a day (QID) | ORAL | Status: DC | PRN

## 2017-11-25 NOTE — Progress Notes (Signed)
PROGRESS NOTE [facility]   Hayley [name]   BTD:[id number] DOB: April 15, 1946  DOA: 11/17/2017 PCP: [facility]   Brief Narrative:  Hayley [name] is a 72 year old female with past medical history of depression, diabetes type 2, COPD, CAD, hypothyroidism who presented to the emergency department after attempting suicide by ingesting 30 pills of Percocet.  Upon ED evaluation CT of the abdomen and pelvis revealed suspicion of acute pancreatitis and patient was admitted for working diagnosis of pancreatitis and psychiatric evaluation.  Psych evaluated patient recommended geriatric inpatient psych admission.  Pancreatitis has resolved, and now tolerating diet well.  Pending bed availability for inpatient psych.  Subjective: Patient seen and examined, today c/o mouth and tongue pain. No acute events overnight   Assessment & Plan: Suicidal attempt in view of severe depression - stable  Patient SI/HI at this moment but continues to have very disorganized thoughts and tangential Intentional overdose with Percocet 30 pills Psychiatry was consulted and recommended inpatient admission at geriatric psychiatric institution Continues to await for inpatient psych bed  Mild acute pancreatitis - resolved Tolerating diet, continue supportive treatment  UTI - completed treatment Urine culture positive for Enterococcus faecalis sensitive to quinolones Completed course of antibiotic with Levaquin and Cipro for total of 3 days  Constipation - resolved Treated with tap water enema Continue bowel regimen with Dulcolax, Senokot and MiraLAX  Peripheral neuropathy Continue gabapentin  Essential tremor - chronic Propanolol initially on hold due to hypotension, blood pressure has stabilized will resume Inderal 60 mg daily for now.  Continue to monitor  Diabetes mellitus type 2 - CBGs stable Holding oral hypoglycemic Continue SSI  Dysphagia Continue mechanical soft diet  DVT  prophylaxis: Eliquis Code Status: Full code Family Communication: None at bedside Disposition Plan: Patient remains medically stable, awaiting for inpatient psych placement  Consultants:   Psychiatry  Procedures:   None  Antimicrobials: Anti-infectives (From admission, onward)   Start     Dose/Rate Route Frequency Ordered Stop   11/20/17 1400  levofloxacin (LEVAQUIN) tablet 500 mg     500 mg Oral Every 24 hours 11/20/17 1335 11/22/17 1307   11/20/17 1345  ciprofloxacin (CIPRO) tablet 500 mg  Status:  Discontinued     500 mg Oral 2 times daily 11/20/17 1335 11/20/17 1335       Objective: Vitals:   11/24/17 2126 11/25/17 0417 11/25/17 0426 11/25/17 0857  BP:  125/67    Pulse:  81    Resp:  18 18   Temp:  99.7 F (37.6 C)    TempSrc:  Oral    SpO2: 92% 92%  93%  Weight:  103 kg (227 lb 1.2 oz)    Height:        Intake/Output Summary (Last 24 hours) at 11/25/2017 1147 Last data filed at 11/25/2017 0749 Gross per 24 hour  Intake 480 ml  Output 1600 ml  Net -1120 ml   Filed Weights   11/22/17 1020 11/23/17 0525 11/25/17 0417  Weight: 110 kg (242 lb 8.1 oz) 107.3 kg (236 lb 8.9 oz) 103 kg (227 lb 1.2 oz)    Examination:  General: NAD HENT: OP clear, tongue with no exudates Cardiovascular: RRR, S1/S2 +, no rubs, no gallops Respiratory: CTA bilaterally, no wheezing, no rhonchi Psychiatry: Pressured speech, disorganized thought, tangential  Data Reviewed: I have personally reviewed following labs and imaging studies  CBC: Recent Labs  Lab 11/20/17 1400  WBC 6.8  NEUTROABS 4.8  HGB 12.6  HCT  39.7  MCV 94.3  PLT 376   Basic Metabolic Panel: Recent Labs  Lab 11/20/17 0454 11/20/17 1400  NA 143 138  K 4.1 3.7  CL 106 102  CO2 30 28  GLUCOSE 120* 181*  BUN 6 7  CREATININE 0.54 0.77  CALCIUM 8.7* 8.4*  MG 2.3  --    GFR: Estimated Creatinine Clearance: 77.1 mL/min (by C-G formula based on SCr of 0.77 mg/dL). Liver Function Tests: No results for  input(s): AST, ALT, ALKPHOS, BILITOT, PROT, ALBUMIN in the last 168 hours. Recent Labs  Lab 11/20/17 0454  LIPASE 33   No results for input(s): AMMONIA in the last 168 hours. Coagulation Profile: No results for input(s): INR, PROTIME in the last 168 hours. Cardiac Enzymes: No results for input(s): CKTOTAL, CKMB, CKMBINDEX, TROPONINI in the last 168 hours. BNP (last 3 results) No results for input(s): PROBNP in the last 8760 hours. HbA1C: No results for input(s): HGBA1C in the last 72 hours. CBG: Recent Labs  Lab 11/24/17 0726 11/24/17 1215 11/24/17 1700 11/24/17 2106 11/25/17 0739  GLUCAP 186* 241* 158* 127* 182*   Lipid Profile: No results for input(s): CHOL, HDL, LDLCALC, TRIG, CHOLHDL, LDLDIRECT in the last 72 hours. Thyroid Function Tests: No results for input(s): TSH, T4TOTAL, FREET4, T3FREE, THYROIDAB in the last 72 hours. Anemia Panel: No results for input(s): VITAMINB12, FOLATE, FERRITIN, TIBC, IRON, RETICCTPCT in the last 72 hours. Sepsis Labs: No results for input(s): PROCALCITON, LATICACIDVEN in the last 168 hours.  Recent Results (from the past 240 hour(s))  Urine culture     Status: None   Collection Time: 11/17/17  2:16 PM  Result Value Ref Range Status   Specimen Description   Final    URINE, CLEAN CATCH Performed at [facility] [address] [place], [place] [address]    Special Requests   Final    Normal Performed at [facility], [place] [address], [place], [place] [address]    Culture   Final    NO GROWTH Performed at [facility], [name] [address], [place], [place] [address]    Report Status 11/18/2017 FINAL  Final  Culture, Urine     Status: Abnormal   Collection Time: 11/20/17 11:53 AM  Result Value Ref Range Status   Specimen Description   Final    URINE, CLEAN CATCH Performed at [facility], [place] [address], [place], [place] [address]    Special Requests   Final     NONE Performed at [facility], [place] [address], [place], [place] [address]    Culture >=100,000 COLONIES/mL ENTEROCOCCUS FAECALIS (A)  Final   Report Status 11/22/2017 FINAL  Final   Organism ID, Bacteria ENTEROCOCCUS FAECALIS (A)  Final      Susceptibility   Enterococcus faecalis - MIC*    AMPICILLIN <=2 SENSITIVE Sensitive     LEVOFLOXACIN 1 SENSITIVE Sensitive     NITROFURANTOIN <=16 SENSITIVE Sensitive     VANCOMYCIN 1 SENSITIVE Sensitive     * >=100,000 COLONIES/mL ENTEROCOCCUS FAECALIS     Radiology Studies: No results found.   Scheduled Meds: . acidophilus  1 capsule Oral Daily  . apixaban  2.5 mg Oral BID  . buPROPion  300 mg Oral Daily  . gabapentin  600 mg Oral TID  . guaiFENesin  1,200 mg Oral BID  . insulin aspart  0-15 Units Subcutaneous TID WC  . insulin glargine  10 Units Subcutaneous QHS  .  levothyroxine  25 mcg Oral QAC breakfast  . mometasone-formoterol  2 puff Inhalation BID  . propranolol ER  60 mg Oral Daily  . senna  1 tablet Oral QHS   Continuous Infusions:   LOS: 8 days   Time spent: Total of 15 minutes spent with pt, greater than 50% of which was spent in discussion of  treatment, counseling and coordination of care  Chipper Oman, MD Pager: Text Page via [email or web address]   If 7PM-7AM, please contact night-coverage [email or web address] 11/25/2017, 11:47 AM   Note - This record has been created using [employer]. Chart creation errors have been sought, but may not always have been located. Such creation errors do not reflect on the standard of medical care.

## 2017-11-25 NOTE — Care Management Important Message (Signed)
Important Message  Patient Details  Name: SHASTINA RUA MRN: [id number] Date of Birth: 15-Apr-1946   Medicare Important Message Given:  Yes    Kerin Salen 11/25/2017, 11:34 AMImportant Message  Patient Details  Name: ZELPHA MESSING MRN: [id number] Date of Birth: [birth year]   Medicare Important Message Given:  Yes    Kerin Salen 11/25/2017, 11:33 AM

## 2017-11-25 NOTE — Progress Notes (Signed)
Pt. stated that she would notify if/when ready for CPAP, RT to monitor.

## 2017-11-25 NOTE — Progress Notes (Signed)
CSW Investigating Geri Psychiatric options. Patient receiving several denial. CSW will seek leadership guidance on placement options.   [name]

## 2017-11-26 LAB — GLUCOSE, CAPILLARY
Glucose-Capillary: 192 mg/dL — ABNORMAL HIGH (ref 65–99)
Glucose-Capillary: 184 mg/dL — ABNORMAL HIGH (ref 65–99)
Glucose-Capillary: 198 mg/dL — ABNORMAL HIGH (ref 65–99)
Glucose-Capillary: 201 mg/dL — ABNORMAL HIGH (ref 65–99)

## 2017-11-26 MED ORDER — TRAZODONE HCL 50 MG PO TABS
50.0000 mg | ORAL_TABLET | Freq: Every day | ORAL | Status: DC
  Administered 2017-11-26 – 2017-12-01 (×6): 50 mg via ORAL
  Filled 2017-11-26 (×6): qty 1

## 2017-11-26 MED ORDER — GI COCKTAIL ~~LOC~~
30.0000 mL | Freq: Two times a day (BID) | ORAL | Status: DC | PRN
  Administered 2017-11-27 – 2017-11-30 (×5): 30 mL via ORAL
  Filled 2017-11-26 (×7): qty 30

## 2017-11-26 MED ORDER — GI COCKTAIL ~~LOC~~
30.0000 mL | Freq: Once | ORAL | Status: AC
  Administered 2017-11-26: 30 mL via ORAL
  Filled 2017-11-26: qty 30

## 2017-11-26 MED ORDER — FAMOTIDINE 20 MG PO TABS
20.0000 mg | ORAL_TABLET | Freq: Every day | ORAL | Status: DC
  Administered 2017-11-26 – 2017-11-29 (×4): 20 mg via ORAL
  Filled 2017-11-26 (×4): qty 1

## 2017-11-26 MED ORDER — LINAGLIPTIN 5 MG PO TABS
5.0000 mg | ORAL_TABLET | Freq: Every day | ORAL | Status: DC
  Administered 2017-11-26 – 2017-11-27 (×2): 5 mg via ORAL
  Filled 2017-11-26 (×2): qty 1

## 2017-11-26 MED ORDER — GLIPIZIDE ER 5 MG PO TB24
5.0000 mg | ORAL_TABLET | Freq: Every day | ORAL | Status: DC
  Administered 2017-11-27: 5 mg via ORAL
  Filled 2017-11-26: qty 1

## 2017-11-26 NOTE — Progress Notes (Signed)
Pt. Placed on CPAP gor h/s, oxygen placed in circuit@ 2lpm.

## 2017-11-26 NOTE — Progress Notes (Signed)
PROGRESS NOTE [facility]   Hayley [name]   SAY:[id number] DOB: 1946-08-15  DOA: 11/17/2017 PCP: [facility]   Brief Narrative:  Hayley [name] is a 72 year old female with past medical history of depression, diabetes type 2, COPD, CAD, hypothyroidism who presented to the emergency department after attempting suicide by ingesting 30 pills of Percocet.  Upon ED evaluation CT of the abdomen and pelvis revealed suspicion of acute pancreatitis and patient was admitted for working diagnosis of pancreatitis and psychiatric evaluation.  Psych evaluated patient recommended geriatric inpatient psych admission.  Pancreatitis has resolved, and now tolerating diet well.  Pending bed availability for inpatient psych.  Subjective: Patient seen and examined, she is complaining of dyspepsia, could not sleep last night.  Very anxious.  Denies SI/HI.  Assessment & Plan: Suicidal attempt in view of severe depression - stable  Patient continues to have disorganized thought and very tangential.  No SI/HI Intentional overdose with Percocet 30 pills Psychiatry was consulted and recommended inpatient admission at geriatric psychiatric institution No bed offers at this point, will consult psychiatry again for disposition and address medication management for possible  mania/depression  Mild acute pancreatitis - resolved Tolerating diet, continue supportive treatment  UTI - completed treatment Urine culture positive for Enterococcus faecalis sensitive to quinolones Completed course of antibiotic with Levaquin and Cipro for total of 3 days  Constipation - resolved Treated with tap water enema  Continue bowel regimen with Dulcolax, Senokot and MiraLAX  Peripheral neuropathy Continue gabapentin  Essential tremor - chronic Propanolol initially on hold due to hypotension, blood pressure has stabilized will resume Inderal 60 mg daily for now.  Continue to monitor  Diabetes mellitus  type 2  CBGs are acceptable Patient being treated with insulin and SSI Will resume home oral hypoglycemics Continue to monitor  GERD/Dysphagia Continue mechanical soft diet Will add Pepcid 20 mg daily and GI cocktail twice daily as needed  DVT prophylaxis: Eliquis Code Status: Full code Family Communication: None at bedside Disposition Plan: Patient remains medically stable, awaiting for inpatient psych placement  Consultants:   Psychiatry  Procedures:   None  Antimicrobials: Anti-infectives (From admission, onward)   Start     Dose/Rate Route Frequency Ordered Stop   11/20/17 1400  levofloxacin (LEVAQUIN) tablet 500 mg     500 mg Oral Every 24 hours 11/20/17 1335 11/22/17 1307   11/20/17 1345  ciprofloxacin (CIPRO) tablet 500 mg  Status:  Discontinued     500 mg Oral 2 times daily 11/20/17 1335 11/20/17 1335       Objective: Vitals:   11/25/17 1947 11/25/17 2100 11/26/17 0601 11/26/17 1320  BP:  (!) 106/56 111/73 119/72  Pulse:  74 77 72  Resp:  18 17 20   Temp:  98.7 F (37.1 C) 98.7 F (37.1 C) 98.9 F (37.2 C)  TempSrc:  Oral Oral Oral  SpO2: 94% 90% 90% 92%  Weight:      Height:        Intake/Output Summary (Last 24 hours) at 11/26/2017 1740 Last data filed at 11/26/2017 1440 Gross per 24 hour  Intake 780 ml  Output 1200 ml  Net -420 ml   Filed Weights   11/22/17 1020 11/23/17 0525 11/25/17 0417  Weight: 110 kg (242 lb 8.1 oz) 107.3 kg (236 lb 8.9 oz) 103 kg (227 lb 1.2 oz)    Examination:  General: Anxious Cardiovascular: RRR, S1/S2 +, no rubs, no gallops Respiratory: CTA bilaterally, no wheezing, no rhonchi Abdominal:  Soft, NT, ND, bowel sounds + Extremities: no edema Psych: Tangential, pressured speech  Data Reviewed: I have personally reviewed following labs and imaging studies  CBC: Recent Labs  Lab 11/20/17 1400  WBC 6.8  NEUTROABS 4.8  HGB 12.6  HCT 39.7  MCV 94.3  PLT 093   Basic Metabolic Panel: Recent Labs  Lab  11/20/17 0454 11/20/17 1400  NA 143 138  K 4.1 3.7  CL 106 102  CO2 30 28  GLUCOSE 120* 181*  BUN 6 7  CREATININE 0.54 0.77  CALCIUM 8.7* 8.4*  MG 2.3  --    GFR: Estimated Creatinine Clearance: 77.1 mL/min (by C-G formula based on SCr of 0.77 mg/dL). Liver Function Tests: No results for input(s): AST, ALT, ALKPHOS, BILITOT, PROT, ALBUMIN in the last 168 hours. Recent Labs  Lab 11/20/17 0454  LIPASE 33   No results for input(s): AMMONIA in the last 168 hours. Coagulation Profile: No results for input(s): INR, PROTIME in the last 168 hours. Cardiac Enzymes: No results for input(s): CKTOTAL, CKMB, CKMBINDEX, TROPONINI in the last 168 hours. BNP (last 3 results) No results for input(s): PROBNP in the last 8760 hours. HbA1C: No results for input(s): HGBA1C in the last 72 hours. CBG: Recent Labs  Lab 11/25/17 1626 11/25/17 2110 11/26/17 0734 11/26/17 1153 11/26/17 1619  GLUCAP 158* 235* 192* 184* 198*   Lipid Profile: No results for input(s): CHOL, HDL, LDLCALC, TRIG, CHOLHDL, LDLDIRECT in the last 72 hours. Thyroid Function Tests: No results for input(s): TSH, T4TOTAL, FREET4, T3FREE, THYROIDAB in the last 72 hours. Anemia Panel: No results for input(s): VITAMINB12, FOLATE, FERRITIN, TIBC, IRON, RETICCTPCT in the last 72 hours. Sepsis Labs: No results for input(s): PROCALCITON, LATICACIDVEN in the last 168 hours.  Recent Results (from the past 240 hour(s))  Urine culture     Status: None   Collection Time: 11/17/17  2:16 PM  Result Value Ref Range Status   Specimen Description   Final    URINE, CLEAN CATCH Performed at [facility] [address] [place], [place] [address]    Special Requests   Final    Normal Performed at [facility], [place] [address] [place], [place] [address]    Culture   Final    NO GROWTH Performed at [facility], [place] [address] [place], [place] [address]    Report Status 11/18/2017  FINAL  Final  Culture, Urine     Status: Abnormal   Collection Time: 11/20/17 11:53 AM  Result Value Ref Range Status   Specimen Description   Final    URINE, CLEAN CATCH Performed at [facility], [place] [address], [place], [place] [address]    Special Requests   Final    NONE Performed at [facility], [place] [address], [place], [place] [address]    Culture >=100,000 COLONIES/mL ENTEROCOCCUS FAECALIS (A)  Final   Report Status 11/22/2017 FINAL  Final   Organism ID, Bacteria ENTEROCOCCUS FAECALIS (A)  Final      Susceptibility   Enterococcus faecalis - MIC*    AMPICILLIN <=2 SENSITIVE Sensitive     LEVOFLOXACIN 1 SENSITIVE Sensitive     NITROFURANTOIN <=16 SENSITIVE Sensitive     VANCOMYCIN 1 SENSITIVE Sensitive     * >=100,000 COLONIES/mL ENTEROCOCCUS FAECALIS     Radiology Studies: No results found.   Scheduled Meds: . acidophilus  1 capsule Oral Daily  . apixaban  2.5 mg Oral BID  .  buPROPion  300 mg Oral Daily  . gabapentin  600 mg Oral TID  . [START ON 11/27/2017] glipiZIDE  5 mg Oral Q breakfast  . guaiFENesin  1,200 mg Oral BID  . insulin aspart  0-15 Units Subcutaneous TID WC  . insulin glargine  10 Units Subcutaneous QHS  . levothyroxine  25 mcg Oral QAC breakfast  . linagliptin  5 mg Oral Daily  . mometasone-formoterol  2 puff Inhalation BID  . propranolol ER  60 mg Oral Daily  . senna  1 tablet Oral QHS   Continuous Infusions:   LOS: 9 days   Time spent: Total of 25 minutes spent with pt, greater than 50% of which was spent in discussion of  treatment, counseling and coordination of care  Chipper Oman, MD Pager: Text Page via [email or web address]   If 7PM-7AM, please contact night-coverage [email or web address] 11/26/2017, 5:40 PM   Note - This record has been created using [employer]. Chart creation errors have been sought, but may not always have been located. Such creation errors do not reflect on the standard of medical  care.

## 2017-11-27 DIAGNOSIS — K858 Other acute pancreatitis without necrosis or infection: Secondary | ICD-10-CM

## 2017-11-27 DIAGNOSIS — K859 Acute pancreatitis without necrosis or infection, unspecified: Principal | ICD-10-CM

## 2017-11-27 DIAGNOSIS — J441 Chronic obstructive pulmonary disease with (acute) exacerbation: Secondary | ICD-10-CM

## 2017-11-27 DIAGNOSIS — F322 Major depressive disorder, single episode, severe without psychotic features: Secondary | ICD-10-CM

## 2017-11-27 LAB — GLUCOSE, CAPILLARY
Glucose-Capillary: 150 mg/dL — ABNORMAL HIGH (ref 65–99)
Glucose-Capillary: 152 mg/dL — ABNORMAL HIGH (ref 65–99)
Glucose-Capillary: 155 mg/dL — ABNORMAL HIGH (ref 65–99)
Glucose-Capillary: 157 mg/dL — ABNORMAL HIGH (ref 65–99)
Glucose-Capillary: 159 mg/dL — ABNORMAL HIGH (ref 65–99)

## 2017-11-27 MED ORDER — HYDROCORTISONE 2.5 % RE CREA
TOPICAL_CREAM | Freq: Two times a day (BID) | RECTAL | Status: DC
  Administered 2017-11-27 (×2): via RECTAL
  Administered 2017-11-28: 1 via RECTAL
  Administered 2017-11-28 – 2017-11-29 (×2): via RECTAL
  Administered 2017-11-29: 1 via RECTAL
  Administered 2017-11-30 – 2017-12-01 (×4): via RECTAL
  Filled 2017-11-27: qty 28.35

## 2017-11-27 MED ORDER — POLYETHYLENE GLYCOL 3350 17 G PO PACK
17.0000 g | PACK | Freq: Every day | ORAL | Status: DC
  Administered 2017-11-28: 17 g via ORAL
  Filled 2017-11-27 (×4): qty 1

## 2017-11-27 NOTE — Consult Note (Addendum)
[facility] Psych Consult Progress Note  11/27/2017 11:43 AM Hayley [name]  MRN:  [id number] Subjective:   Hayley [name] was last seen on 3/4 for suicide attempt by overdose on Percocet. She planned her suicide attempt and put her affairs in order prior to committing suicide.   On interview, Hayley [name] reports an improvement in her mood. She denies SI since admission. She has been working on reducing the stressors that led to her overdose. Her husband is going to live with their daughter so that he is able to have adequate care. They plan to sell their home in the near future to reduce their financial burden. She has been feeling more spiritually connected. She has support from her church family. She complains of constipation and is working on her diet. She reports an improvement in her sleep with Trazodone. She reports a good appetite. She denies current SI, HI or AVH. She reports that if she were suicidal she would be able to notify someone. She is unable to develop a safety plan if she were to discharge home. She reports that she "hasn't been one to reach out" when she needs help. She does not have anyone that could monitor her for safety if she were discharged home. She would be home alone.   Patient's son-in-law, [name] was contacted with her permission. He does not feel comfortable with her returning home as there is no safety plan in place for her and there is no one that could monitor her for safety. He believes that she would benefit from inpatient psychiatric hospitalization.   Principal Problem: MDD (major depressive episode), single episode, severe, no psychosis ([place]) Diagnosis:   Patient Active Problem List   Diagnosis Date Noted  . MDD (major depressive episode), single episode, severe, no psychosis ([place]) [F32.2]   . Pancreatitis [K85.90] 11/17/2017  . Writer's cramp [F48.8] 01/02/2017  . Neck pain on left side [M54.2] 09/26/2016  . Tremor [R25.1] 06/20/2016  . Obesity, Class II, BMI 35-39.9, isolated  (see actual BMI) [E66.9] 04/09/2016  . UTI (lower urinary tract infection) [N39.0]   . Paresthesia of both lower extremities [R20.2] 03/22/2016  . Vertigo [R42] 03/22/2016  . Essential tremor [G25.0] 03/12/2016  . Fall [W19.XXXA] 03/12/2016  . Low back pain [M54.5] 03/12/2016  . Coagulopathy ([place]) [D68.9] 07/20/2015  . Diabetes mellitus without complication ([place]) [E09.2] 06/03/2015  . Essential hypertension [I10] 06/03/2015  . OA (osteoarthritis) of knee [M17.10] 12/06/2014  . Hypothyroidism [E03.9] 03/03/2014  . Asthma [J45.909] 03/03/2014  . Acute blood loss anemia [D62] 03/03/2014  . Left knee pain [M25.562] 03/03/2014  . Failed total knee arthroplasty ([place]) [Z30.076A, Z96.659] 02/24/2014  . OSA on CPAP [G47.33, Z99.89] 12/23/2013  . History of pulmonary embolus (PE) [Z86.711] 12/21/2013  . 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 Obesity, morbid ([place]) [E66.01] 04/27/2010  . ANEMIA [D64.9] 04/27/2010  . ANXIETY DEPRESSION [F34.1] 04/27/2010  . Chronic migraine [G43.709] 04/27/2010  . NEUROPATHY [G58.9] 04/27/2010  . HEMORRHOIDS [K64.9] 04/27/2010  . GERD [K21.9] 04/27/2010  . DIVERTICULOSIS, COLON [K57.30] 04/27/2010  . IBS [K58.9] 04/27/2010  . CHOLELITHIASIS [K80.20] 04/27/2010  . RENAL CALCULUS [N20.0] 04/27/2010   Total Time spent with patient: 30 minutes  Past Psychiatric History: Depression  Past Medical History:  Past Medical History:  Diagnosis Date  . Anemia   . Anxiety   . Bronchitis    hx of   . Clotting disorder ([place]) 02-17-14   S/p appendectomy-developed Pulmonary emboli-tx. warfarin-d/c 1 yr ago.  . Colon polyps   .  Depression   . Diabetes mellitus without complication ([place])    "borderline"- oral med  . Disorder of vocal cords    spasmotic dysphonia ,02-17-14 has" whispery voice-low tone"  . Hyperthyroidism   . Kidney stones   . Migraines   . Nausea Nov 15, 2014   cycle of migraine headaches and nausea  . Neuromuscular disorder ([place])    hands and  throat"spasmodic dysphonia", tremors - thimb and forefinger   . Obesity, Class III, BMI 40-49.9 (morbid obesity) ([place]) 04/27/2010   02-17-14 reports some weight loss- intentional  . Osteoarthritis    in back(chronic pain)  . Pelvic pain    02-17-14 "states thinks its back related on left groin"  . Pulmonary emboli [facility])    s/p Appendectomy '13 [facility]  . Reflux   . Right knee pain   . Sleep apnea    cpap - settings at 4   . Toxic goiter     Past Surgical History:  Procedure Laterality Date  . ABDOMINAL HYSTERECTOMY    . APPENDECTOMY     3'13 [facility] s/p developed  Pulmonary Emboli  . BOTOX INJECTION     for migranes  . CATARACT EXTRACTION, BILATERAL Bilateral 03/2013   . CHOLECYSTECTOMY    . HAND SURGERY  2004   both hands  . KIDNEY STONE SURGERY    . KNEE ARTHROSCOPY  1998   right  . LAPAROSCOPIC APPENDECTOMY  11/27/2011   Procedure: APPENDECTOMY LAPAROSCOPIC;  Surgeon: Adin Hector, MD;  Location: [facility] ORS;  Service: General;  Laterality: N/A;  . SPINAL CORD STIMULATOR IMPLANT  06/04/2016   Dr. Alfonse Ras at [facility]  . TOTAL KNEE ARTHROPLASTY Right 12/06/2014   Procedure: RIGHT TOTAL KNEE ARTHROPLASTY;  Surgeon: Gaynelle Arabian, MD;  Location: [facility] ORS;  Service: Orthopedics;  Laterality: Right;  . TOTAL KNEE REVISION Left 02/24/2014   Procedure: LEFT TOTAL KNEE REVISION;  Surgeon: Gearlean Alf, MD;  Location: [facility] ORS;  Service: Orthopedics;  Laterality: Left;  . vocal cord surgery  02-17-14   01-12-14 done in [place] [facility](s/p selective denervation/reinnervation recurrent laryngeal nerve surgery)  . zenkers diverticulum     Family History:  Family History  Problem Relation Age of Onset  . Heart disease Mother   . Pneumonia Father   . Cirrhosis Father   . Hypertension Father   . Alcoholism Father   . Heart attack Paternal Grandfather   . Heart attack Maternal Grandfather   . Heart attack Paternal Grandmother   . Heart attack Maternal Grandmother    Family  Psychiatric  History: Father-alcoholism, daughter-BPAD type 1, son-BPAD type 2 and niece-depression.   Social History:  Social History   Substance and Sexual Activity  Alcohol Use No     Social History   Substance and Sexual Activity  Drug Use No    Social History   Socioeconomic History  . Marital status: Married    Spouse name: None  . Number of children: 5  . Years of education: 48  . Highest education level: None  Social Needs  . Financial resource strain: None  . Food insecurity - worry: None  . Food insecurity - inability: None  . Transportation needs - medical: None  . Transportation needs - non-medical: None  Occupational History  . Occupation: Retired  Tobacco Use  . Smoking status: Former Smoker    Last attempt to quit: 01/03/1982    Years since quitting: 35.9  . Smokeless tobacco: Never Used  Substance and  Sexual Activity  . Alcohol use: No  . Drug use: No  . Sexual activity: No  Other Topics Concern  . None  Social History Narrative   Lives at home with husband.   Right-handed.   2 cups caffeine per day.    Sleep: Fair  Appetite:  Good  Current Medications: Current Facility-Administered Medications  Medication Dose Route Frequency Provider Last Rate Last Dose  . acetaminophen (TYLENOL) tablet 650 mg  650 mg Oral Q6H PRN Bennie Pierini, MD   650 mg at 11/27/17 0309   Or  . acetaminophen (TYLENOL) suppository 650 mg  650 mg Rectal Q6H PRN Bennie Pierini, MD      . acidophilus (RISAQUAD) capsule 1 capsule  1 capsule Oral Daily Shahmehdi, Seyed A, MD   1 capsule at 11/27/17 1031  . albuterol (PROVENTIL) (2.5 MG/3ML) 0.083% nebulizer solution 2.5 mg  2.5 mg Nebulization Q6H PRN Bennie Pierini, MD   2.5 mg at 11/26/17 1931  . apixaban (ELIQUIS) tablet 2.5 mg  2.5 mg Oral BID Bennie Pierini, MD   2.5 mg at 11/27/17 1030  . bisacodyl (DULCOLAX) EC tablet 5 mg  5 mg Oral Daily PRN Patrecia Pour, Christean Grief, MD   5 mg at 11/27/17 1117  .  buPROPion (WELLBUTRIN XL) 24 hr tablet 300 mg  300 mg Oral Daily Bennie Pierini, MD   300 mg at 11/27/17 1030  . diphenhydrAMINE (BENADRYL) capsule 25 mg  25 mg Oral Q6H PRN Schorr, Rhetta Mura, NP      . famotidine (PEPCID) tablet 20 mg  20 mg Oral Daily Patrecia Pour, Christean Grief, MD   20 mg at 11/27/17 1031  . gabapentin (NEURONTIN) capsule 600 mg  600 mg Oral TID Bennie Pierini, MD   600 mg at 11/23/17 2203  . gi cocktail (Maalox,Lidocaine,Donnatal)  30 mL Oral BID PRN Patrecia Pour, Christean Grief, MD   30 mL at 11/27/17 0437  . glipiZIDE (GLUCOTROL XL) 24 hr tablet 5 mg  5 mg Oral Q breakfast Patrecia Pour, Christean Grief, MD   5 mg at 11/27/17 0825  . guaiFENesin (MUCINEX) 12 hr tablet 1,200 mg  1,200 mg Oral BID Schorr, Rhetta Mura, NP   1,200 mg at 11/27/17 1031  . insulin aspart (novoLOG) injection 0-15 Units  0-15 Units Subcutaneous TID WC Roney Jaffe, MD   3 Units at 11/27/17 [phone]  . insulin glargine (LANTUS) injection 10 Units  10 Units Subcutaneous QHS Bennie Pierini, MD   10 Units at 11/26/17 2200  . levothyroxine (SYNTHROID, LEVOTHROID) tablet 25 mcg  25 mcg Oral QAC breakfast Bennie Pierini, MD   25 mcg at 11/27/17 0825  . linagliptin (TRADJENTA) tablet 5 mg  5 mg Oral Daily Patrecia Pour, Christean Grief, MD   5 mg at 11/27/17 1030  . magic mouthwash w/lidocaine  5 mL Oral TID PRN Patrecia Pour, Christean Grief, MD   5 mL at 11/26/17 1423  . mometasone-formoterol (DULERA) 100-5 MCG/ACT inhaler 2 puff  2 puff Inhalation BID Bennie Pierini, MD   2 puff at 11/26/17 1932  . ondansetron (ZOFRAN) tablet 4 mg  4 mg Oral Q6H PRN Bennie Pierini, MD   4 mg at 11/27/17 0445   Or  . ondansetron [facility]) injection 4 mg  4 mg Intravenous Q6H PRN Bennie Pierini, MD   4 mg at 11/21/17 1733  . oxyCODONE (Oxy IR/ROXICODONE) immediate release tablet 5 mg  5 mg Oral Q4H PRN Bennie Pierini, MD  5 mg at 11/27/17 0310  . polyethylene glycol (MIRALAX / GLYCOLAX) packet 17 g  17 g Oral Daily PRN Bennie Pierini, MD    17 g at 11/22/17 2222  . propranolol ER (INDERAL LA) 24 hr capsule 60 mg  60 mg Oral Daily Patrecia Pour, Christean Grief, MD   60 mg at 11/26/17 1002  . senna (SENOKOT) tablet 8.6 mg  1 tablet Oral QHS Patrecia Pour, Christean Grief, MD   8.6 mg at 11/26/17 2200  . traZODone (DESYREL) tablet 50 mg  50 mg Oral QHS Patrecia Pour, Christean Grief, MD   50 mg at 11/26/17 2200    Lab Results:  Results for orders placed or performed during the hospital encounter of 11/17/17 (from the past 48 hour(s))  Glucose, capillary     Status: Abnormal   Collection Time: 11/25/17 11:58 AM  Result Value Ref Range   Glucose-Capillary 177 (H) 65 - 99 mg/dL  Glucose, capillary     Status: Abnormal   Collection Time: 11/25/17  4:26 PM  Result Value Ref Range   Glucose-Capillary 158 (H) 65 - 99 mg/dL  Glucose, capillary     Status: Abnormal   Collection Time: 11/25/17  9:10 PM  Result Value Ref Range   Glucose-Capillary 235 (H) 65 - 99 mg/dL  Glucose, capillary     Status: Abnormal   Collection Time: 11/26/17  7:34 AM  Result Value Ref Range   Glucose-Capillary 192 (H) 65 - 99 mg/dL  Glucose, capillary     Status: Abnormal   Collection Time: 11/26/17 11:53 AM  Result Value Ref Range   Glucose-Capillary 184 (H) 65 - 99 mg/dL  Glucose, capillary     Status: Abnormal   Collection Time: 11/26/17  4:19 PM  Result Value Ref Range   Glucose-Capillary 198 (H) 65 - 99 mg/dL  Glucose, capillary     Status: Abnormal   Collection Time: 11/26/17  7:50 PM  Result Value Ref Range   Glucose-Capillary 201 (H) 65 - 99 mg/dL  Glucose, capillary     Status: Abnormal   Collection Time: 11/27/17 12:01 AM  Result Value Ref Range   Glucose-Capillary 150 (H) 65 - 99 mg/dL  Glucose, capillary     Status: Abnormal   Collection Time: 11/27/17  7:58 AM  Result Value Ref Range   Glucose-Capillary 155 (H) 65 - 99 mg/dL    Blood Alcohol level:  Lab Results  Component Value Date   ETH <5 03/22/2016   Musculoskeletal: Strength & Muscle Tone: within  normal limits Gait & Station: UTA since lying in bed. Patient leans: N/A  Psychiatric Specialty Exam: Physical Exam  Nursing note and vitals reviewed. Constitutional: She is oriented to person, place, and time. She appears well-developed and well-nourished.  HENT:  Head: Normocephalic and atraumatic.  Neck: Normal range of motion.  Respiratory: Effort normal.  Musculoskeletal: Normal range of motion.  Neurological: She is alert and oriented to person, place, and time.  Skin: No rash noted.  Psychiatric: Her speech is normal and behavior is normal. Judgment and thought content normal. Cognition and memory are normal. She exhibits a depressed mood.    Review of Systems  Constitutional: Negative for chills and fever.  Cardiovascular: Negative for chest pain.  Gastrointestinal: Positive for constipation. Negative for abdominal pain, diarrhea, nausea and vomiting.  Psychiatric/Behavioral: Positive for depression. Negative for hallucinations, substance abuse and suicidal ideas. The patient has insomnia. The patient is not nervous/anxious.   All other systems reviewed and are negative.  Blood pressure 93/80, pulse 82, temperature 98.3 F (36.8 C), temperature source Oral, resp. rate 18, height 5\' 6"  (1.676 m), weight 102.5 kg (225 lb 15.5 oz), last menstrual period 09/18/1979, SpO2 90 %.Body mass index is 36.47 kg/m.  General Appearance: Well Groomed, elderly, Caucasian female with short, gray, shaved hair, wearing a hospital gown and lying in bed. NAD.  Eye Contact:  Good  Speech:  Clear and Coherent and Normal Rate  Volume:  Normal  Mood:  "Better"  Affect:  Appropriate and Full Range  Thought Process:  Goal Directed, Linear and Descriptions of Associations: Intact  Orientation:  Full (Time, Place, and Person)  Thought Content:  Logical  Suicidal Thoughts:  No  Homicidal Thoughts:  No  Memory:  Immediate;   Good Recent;   Good Remote;   Good  Judgement:  Fair  Insight:  Fair   Psychomotor Activity:  Normal  Concentration:  Concentration: Good and Attention Span: Good  Recall:  Good  Fund of Knowledge:  Good  Language:  Good  Akathisia:  No  Handed:  Right  AIMS (if indicated):   N/A  Assets:  Communication Skills Desire for Improvement Financial Resources/Insurance Housing Intimacy Social Support  ADL's:  Intact  Cognition:  WNL  Sleep:   Fair   Assessment:  Hayley [name] is a 72 y.o. female who was admitted for suicide attempt by overdose on Percocet. She continues to warrant inpatient psychiatric hospitalization due to the severity of her attempt and depression secondary to psychosocial stressors and inability to have a viable safety plan for discharge home at this time.   Treatment Plan Summary: -Continue home Wellbutrin 300 mg daily for depression.  -Patient warrants inpatient geropsychiatric hospitalization given high risk of harm to self. -Continue bedside sitter.  -Please pursue involuntary commitment if patient refuses voluntary psychiatric hospitalization or attempts to leave the hospital.  -Will sign off on patient at this time. Please consult psychiatry again as needed.    Faythe Dingwall, DO 11/27/2017, 11:43 AM   Addendum: Spoke to SW about patient and family would like her to discharge with them monitoring her for safety until she follows up for her outpatient mental health appointment. Agreeable to this plan since this would have been an option at last visit with patient but at that time there was not a viable plan in place. Patient's mood has improved since hospitalization. She has been future oriented and actively working on plans to reduce the stressors that led to her overdose. She is psychiatrically cleared and family is agreeable to monitor her 24/7 for safety.   Buford Dresser, DO 12/02/17 3:05 PM

## 2017-11-27 NOTE — Progress Notes (Signed)
Pt is wondering if she is experiencing a yeast infection as she has burning and itching in vaginal area.  Also wondering if she has thrush in her mouth.

## 2017-11-27 NOTE — Progress Notes (Signed)
PROGRESS NOTE    Hayley [name]  DXA:[id number] DOB: 17-Jul-1946 DOA: 11/17/2017 PCP: [facility]    Brief Narrative:  72 year old female who presented with abdominal pain after a suicidal attempt.  Patient does have a significant past medical history for depression, type 2 diabetes mellitus, COPD, coronary artery disease, hypothyroidism and bilateral paresthesias with essential tremor.  Had a suicide attempt on March 1, took 30 pills of Percocet with intention to hurt herself.  Positive abdominal pain, associated with nausea and vomiting, no fevers or chills.  On the initial physical examination blood pressure 105 471, heart rate of 104, respiratory 16, saturation 94%.  Dry mucous membranes, lungs clear to auscultation bilaterally, heart S1-S2 present rhythmic, abdomen tender in the periumbilical region, no guarding, no rebound, no lower extremity edema.   Patient was admitted to the hospital with the working diagnosis of acute pancreatitis complicated by a suicidal attempt.   Assessment & Plan:   Principal Problem:   MDD (major depressive episode), single episode, severe, no psychosis ([place]) Active Problems:   Pancreatitis  1.  Acute pancreatitis. Improved pain, will continue to advance diet as tolerated, no nausea or vomiting.   2.  Suicide attempt in the setting of severe depression. Waiting for placement at [facility], follow on psychiatry recommendations. Continue bupropion, trazodone  3.  Urinary tract infection. Completed antibiotic therapy  4.  Peripheral neuropathy. Out of bed as tolerated, physical therapy evaluation. Continue gabapentin.   5.  Type 2 diabetes mellitus,. Continue capillary glucose monitoring with iss, patient tolerating po well, will advance diet. Hold on oral hypoglycemic agents for now. 10 units daily of glargine. Capillary glucose well controlled.   6. Hypothyroid. Continue levothyroxine.   7. History of pulmonary embolism.  Continue anticoagulation with apixaban.     DVT prophylaxis: enoxaparin   Code Status:  full Family Communication: I spoke with patient's family at the bedside and all questions were addressed.  Disposition Plan: psych unit   Consultants:   Psych  Procedures:     Antimicrobials:       Subjective: Patient is feeling better, no nausea or vomiting, no abdominal pain. No chest pain or dyspnea.   Objective: Vitals:   11/27/17 0448 11/27/17 0455 11/27/17 1038 11/27/17 1300  BP:  99/67 93/80 (!) 88/59  Pulse:  70 82 83  Resp:  18    Temp:  98.3 F (36.8 C)  97.7 F (36.5 C)  TempSrc:  Oral  Oral  SpO2:  90%  90%  Weight: 102.5 kg (225 lb 15.5 oz)     Height:        Intake/Output Summary (Last 24 hours) at 11/27/2017 1502 Last data filed at 11/27/2017 1450 Gross per 24 hour  Intake 840 ml  Output 900 ml  Net -60 ml   Filed Weights   11/23/17 0525 11/25/17 0417 11/27/17 0448  Weight: 107.3 kg (236 lb 8.9 oz) 103 kg (227 lb 1.2 oz) 102.5 kg (225 lb 15.5 oz)    Examination:   General: Not in pain or dyspnea. deconditioned Neurology: Awake and alert, non focal  E ENT: mild pallor, no icterus, oral mucosa moist Cardiovascular: No JVD. S1-S2 present, rhythmic, no gallops, rubs, or murmurs. No lower extremity edema. Pulmonary: vesicular breath sounds bilaterally, adequate air movement, no wheezing, rhonchi or rales. Gastrointestinal. Abdomen protuberant, no organomegaly, non tender, no rebound or guarding Skin. No rashes Musculoskeletal: no joint deformities     Data Reviewed: I have personally reviewed  following labs and imaging studies  CBC: No results for input(s): WBC, NEUTROABS, HGB, HCT, MCV, PLT in the last 168 hours. Basic Metabolic Panel: No results for input(s): NA, K, CL, CO2, GLUCOSE, BUN, CREATININE, CALCIUM, MG, PHOS in the last 168 hours. GFR: Estimated Creatinine Clearance: 76.9 mL/min (by C-G formula based on SCr of 0.77 mg/dL). Liver  Function Tests: No results for input(s): AST, ALT, ALKPHOS, BILITOT, PROT, ALBUMIN in the last 168 hours. No results for input(s): LIPASE, AMYLASE in the last 168 hours. No results for input(s): AMMONIA in the last 168 hours. Coagulation Profile: No results for input(s): INR, PROTIME in the last 168 hours. Cardiac Enzymes: No results for input(s): CKTOTAL, CKMB, CKMBINDEX, TROPONINI in the last 168 hours. BNP (last 3 results) No results for input(s): PROBNP in the last 8760 hours. HbA1C: No results for input(s): HGBA1C in the last 72 hours. CBG: Recent Labs  Lab 11/26/17 1619 11/26/17 1950 11/27/17 0001 11/27/17 0758 11/27/17 1206  GLUCAP 198* 201* 150* 155* 159*   Lipid Profile: No results for input(s): CHOL, HDL, LDLCALC, TRIG, CHOLHDL, LDLDIRECT in the last 72 hours. Thyroid Function Tests: No results for input(s): TSH, T4TOTAL, FREET4, T3FREE, THYROIDAB in the last 72 hours. Anemia Panel: No results for input(s): VITAMINB12, FOLATE, FERRITIN, TIBC, IRON, RETICCTPCT in the last 72 hours.    Radiology Studies: I have reviewed all of the imaging during this hospital visit personally     Scheduled Meds: . acidophilus  1 capsule Oral Daily  . apixaban  2.5 mg Oral BID  . buPROPion  300 mg Oral Daily  . famotidine  20 mg Oral Daily  . gabapentin  600 mg Oral TID  . glipiZIDE  5 mg Oral Q breakfast  . guaiFENesin  1,200 mg Oral BID  . hydrocortisone   Rectal BID  . insulin aspart  0-15 Units Subcutaneous TID WC  . insulin glargine  10 Units Subcutaneous QHS  . levothyroxine  25 mcg Oral QAC breakfast  . linagliptin  5 mg Oral Daily  . mometasone-formoterol  2 puff Inhalation BID  . propranolol ER  60 mg Oral Daily  . senna  1 tablet Oral QHS  . traZODone  50 mg Oral QHS   Continuous Infusions:   LOS: 10 days        Bodhi Stenglein Gerome Apley, MD [facility] Pager [phone]

## 2017-11-27 NOTE — Progress Notes (Addendum)
Pt prefers to call when she is ready for cpap tonight.  Pt states she took a nap earlier today and is not ready for cpap at this time.  RN aware.

## 2017-11-28 LAB — GLUCOSE, CAPILLARY
Glucose-Capillary: 142 mg/dL — ABNORMAL HIGH (ref 65–99)
Glucose-Capillary: 172 mg/dL — ABNORMAL HIGH (ref 65–99)
Glucose-Capillary: 171 mg/dL — ABNORMAL HIGH (ref 65–99)
Glucose-Capillary: 122 mg/dL — ABNORMAL HIGH (ref 65–99)

## 2017-11-28 MED ORDER — CLONAZEPAM 0.5 MG PO TABS
0.5000 mg | ORAL_TABLET | Freq: Once | ORAL | Status: AC
  Administered 2017-11-28: 0.5 mg via ORAL
  Filled 2017-11-28: qty 1

## 2017-11-28 MED ORDER — BISACODYL 5 MG PO TBEC
10.0000 mg | DELAYED_RELEASE_TABLET | Freq: Every day | ORAL | Status: DC
  Administered 2017-11-29 – 2017-12-01 (×3): 10 mg via ORAL
  Filled 2017-11-28 (×4): qty 2

## 2017-11-28 NOTE — Progress Notes (Signed)
[facility] ED CSW on 2nd shift received verbal handoff stating that pt may be receiving a call from [facility] with a bed offer.  If bed offer received this CSW was informed pt's SIL [name] at ph: [phone] is to be called and is to take the pt's CPAP to the facility, if RN can pack up the CPAP machine and then leave it at nurses station, if patient transports before he arrives.   CSW will continue to follow for D/C needs.  Alphonse Guild. Revanth Neidig, LCSW, LCAS, CSI Clinical Social Worker Ph: [phone]

## 2017-11-28 NOTE — Progress Notes (Signed)
LCSW following for Inpatient psych placement.   [name] may be able to accept patient today or tonight.  They will call ED CSW and/or RN with update if they can accept after hours today.   Patient is voluntary and will transfer by [facility].   Patient will need to take CPAP to the facility.   Patient's son in law, Hayley [name] [phone] is able to take the CPAP to the facility if RN can pack up and leave at nurses station if patient transports before he arrives.    LCSW notified patients daughter, [name] by phone. Please contact Hayley [name] with any updates today.   [name]

## 2017-11-28 NOTE — Progress Notes (Addendum)
LCSW following for disposition: inpatient geriatric recommendation  Patient has been re-sent/referred to the following:  [facility]: (Under review) [facility]: (Medically declined due to patient being a fall risk, and having a clotting disorder) [facility]: (attempted to send x2, fax not working) [facility]: (attempted to call, no one is answering) [facility]: (under review) [facility]: (at capacity/unit shut down) [facility]: (not appropriate) [facility]: under review, intake is calling the RN for more information [facility]: (declined, facility did not find her acute)  LCSW will continue to follow up with referrals.  If patient denied yet again from the above listing, will refer to leadership for guidance and explore family willingness to assist with supervision at home with outpatient follow up recommendations.  Baril Hacker, MSW Clinical Social Work: [employer] Cover[employer] for :  [phone]

## 2017-11-28 NOTE — Progress Notes (Signed)
PROGRESS NOTE    Hayley [name]  ATF:[id number] DOB: 09-Jul-1946 DOA: 11/17/2017 PCP: [facility]    Brief Narrative:  72 year old female who presented with abdominal pain after a suicidal attempt.  Patient does have a significant past medical history for depression, type 2 diabetes mellitus, COPD, coronary artery disease, hypothyroidism and bilateral paresthesias with essential tremor.  Had a suicide attempt on March 1, took 30 pills of Percocet with intention to hurt herself.  Positive abdominal pain, associated with nausea and vomiting, no fevers or chills.  On the initial physical examination blood pressure 105 471, heart rate of 104, respiratory 16, saturation 94%.  Dry mucous membranes, lungs clear to auscultation bilaterally, heart S1-S2 present rhythmic, abdomen tender in the periumbilical region, no guarding, no rebound, no lower extremity edema.   Patient was admitted to the hospital with the working diagnosis of acute pancreatitis complicated by a suicidal attempt.   Assessment & Plan:   Principal Problem:   MDD (major depressive episode), single episode, severe, no psychosis ([place]) Active Problems:   Pancreatitis   1.  Acute pancreatitis. Tolerating po diet well, no further abdominal pain, no nausea or vomiting. Will add dulcolax for bowel regimen.   2.  Suicide attempt in the setting of severe depression. Social services consulted for placement at [facility]. On bupropion, and trazodone. Suicidal precautions, one to one sitter.   3.  Urinary tract infection./ resolved.   4.  Peripheral neuropathy. Physical therapy evaluation. Continue gabapentin.   5.  Type 2 diabetes mellitus,. Continue glucose coverage and monitoring with insulin sliding scale. Continue basal insulin therapy with levimir. Capillary glucose 159, 157, 152, 142, 172.    6. Hypothyroid. On levothyroxine.   7. History of pulmonary embolism. Anticoagulation with apixaban  with good toleration.     DVT prophylaxis: enoxaparin   Code Status:  full Family Communication: No family at the bedside  Disposition Plan: psych unit   Consultants:   Psych  Procedures:     Antimicrobials:      Subjective: Patient not able to sleep last night, due to constipation, no nausea or vomiting, no dyspnea or chest pain.  Objective: Vitals:   11/28/17 0500 11/28/17 0645 11/28/17 0649 11/28/17 1108  BP:  (!) 82/36 97/72 (!) 112/59  Pulse:  80  86  Resp:  18    Temp:  98.6 F (37 C)    TempSrc:  Oral    SpO2:  90%    Weight: 101.8 kg (224 lb 6.9 oz)     Height:        Intake/Output Summary (Last 24 hours) at 11/28/2017 1331 Last data filed at 11/27/2017 1935 Gross per 24 hour  Intake 480 ml  Output -  Net 480 ml   Filed Weights   11/25/17 0417 11/27/17 0448 11/28/17 0500  Weight: 103 kg (227 lb 1.2 oz) 102.5 kg (225 lb 15.5 oz) 101.8 kg (224 lb 6.9 oz)    Examination:   General: Not in pain or dyspnea, deconditioned Neurology: Awake and alert, non focal  E ENT: no pallor, no icterus, oral mucosa moist Cardiovascular: No JVD. S1-S2 present, rhythmic, no gallops, rubs, or murmurs. No lower extremity edema. Pulmonary: vesicular breath sounds bilaterally, adequate air movement, no wheezing, rhonchi or rales. Gastrointestinal. Abdomen protuberant, no organomegaly, non tender, no rebound or guarding Skin. No rashes Musculoskeletal: no joint deformities     Data Reviewed: I have personally reviewed following labs and imaging studies  CBC: No  results for input(s): WBC, NEUTROABS, HGB, HCT, MCV, PLT in the last 168 hours. Basic Metabolic Panel: No results for input(s): NA, K, CL, CO2, GLUCOSE, BUN, CREATININE, CALCIUM, MG, PHOS in the last 168 hours. GFR: Estimated Creatinine Clearance: 76.6 mL/min (by C-G formula based on SCr of 0.77 mg/dL). Liver Function Tests: No results for input(s): AST, ALT, ALKPHOS, BILITOT, PROT, ALBUMIN in  the last 168 hours. No results for input(s): LIPASE, AMYLASE in the last 168 hours. No results for input(s): AMMONIA in the last 168 hours. Coagulation Profile: No results for input(s): INR, PROTIME in the last 168 hours. Cardiac Enzymes: No results for input(s): CKTOTAL, CKMB, CKMBINDEX, TROPONINI in the last 168 hours. BNP (last 3 results) No results for input(s): PROBNP in the last 8760 hours. HbA1C: No results for input(s): HGBA1C in the last 72 hours. CBG: Recent Labs  Lab 11/27/17 1206 11/27/17 1730 11/27/17 2145 11/28/17 0939 11/28/17 1226  GLUCAP 159* 157* 152* 142* 172*   Lipid Profile: No results for input(s): CHOL, HDL, LDLCALC, TRIG, CHOLHDL, LDLDIRECT in the last 72 hours. Thyroid Function Tests: No results for input(s): TSH, T4TOTAL, FREET4, T3FREE, THYROIDAB in the last 72 hours. Anemia Panel: No results for input(s): VITAMINB12, FOLATE, FERRITIN, TIBC, IRON, RETICCTPCT in the last 72 hours.    Radiology Studies: I have reviewed all of the imaging during this hospital visit personally     Scheduled Meds: . acidophilus  1 capsule Oral Daily  . apixaban  2.5 mg Oral BID  . buPROPion  300 mg Oral Daily  . famotidine  20 mg Oral Daily  . gabapentin  600 mg Oral TID  . guaiFENesin  1,200 mg Oral BID  . hydrocortisone   Rectal BID  . insulin aspart  0-15 Units Subcutaneous TID WC  . insulin glargine  10 Units Subcutaneous QHS  . levothyroxine  25 mcg Oral QAC breakfast  . mometasone-formoterol  2 puff Inhalation BID  . polyethylene glycol  17 g Oral Daily  . propranolol ER  60 mg Oral Daily  . senna  1 tablet Oral QHS  . traZODone  50 mg Oral QHS   Continuous Infusions:   LOS: 11 days        Tamiyah Moulin Gerome Apley, MD [facility] Pager [phone]

## 2017-11-29 DIAGNOSIS — F329 Major depressive disorder, single episode, unspecified: Secondary | ICD-10-CM

## 2017-11-29 LAB — GLUCOSE, CAPILLARY
Glucose-Capillary: 139 mg/dL — ABNORMAL HIGH (ref 65–99)
Glucose-Capillary: 140 mg/dL — ABNORMAL HIGH (ref 65–99)
Glucose-Capillary: 145 mg/dL — ABNORMAL HIGH (ref 65–99)
Glucose-Capillary: 168 mg/dL — ABNORMAL HIGH (ref 65–99)

## 2017-11-29 MED ORDER — PANTOPRAZOLE SODIUM 40 MG PO TBEC
40.0000 mg | DELAYED_RELEASE_TABLET | Freq: Two times a day (BID) | ORAL | Status: DC
Start: 2017-11-29 — End: 2017-12-02
  Administered 2017-11-29 – 2017-12-02 (×7): 40 mg via ORAL
  Filled 2017-11-29 (×7): qty 1

## 2017-11-29 MED ORDER — CLONAZEPAM 0.5 MG PO TABS
0.2500 mg | ORAL_TABLET | Freq: Once | ORAL | Status: AC
  Administered 2017-11-29: 0.25 mg via ORAL
  Filled 2017-11-29: qty 1

## 2017-11-29 MED ORDER — SUCRALFATE 1 GM/10ML PO SUSP
1.0000 g | Freq: Three times a day (TID) | ORAL | Status: DC
  Administered 2017-11-29 – 2017-12-02 (×10): 1 g via ORAL
  Filled 2017-11-29 (×11): qty 10

## 2017-11-29 MED ORDER — CLONAZEPAM 0.5 MG PO TABS
0.5000 mg | ORAL_TABLET | Freq: Every evening | ORAL | Status: DC | PRN
  Administered 2017-11-30 – 2017-12-02 (×3): 0.5 mg via ORAL
  Filled 2017-11-29 (×3): qty 1

## 2017-11-29 NOTE — Progress Notes (Addendum)
LCSW following for gero psych placement.  Patient is on the waiting list for [facility]. Possible bed tomorrow.   LCSW met with [name], patient's daughter to discuss other options.   LCSW explained that facilities are now denying because they do not feel patient meets criteria due to length in hospital (no longer acute). LCSW explained [facility] wait list and that it can take up to 6 weeks for a bed to come available.   LCSW explained that patient is not receiving any treatment in the hospital. Psych discussed dc home with outpatient services earlier this week. LCSW discussed outpatient and partial inpatient options.  [name] expressed understanding.   [name] expressed that they have a limited support and she and her husband have exhausted their leave. [name] stated she will call her brother who lives out of state to see if he can fly in to provide assistance if patient is dc'd home with outpatient services.   LCSW will continue to follow for [facility]. LCSW will follow up with family regarding home options.   Carolin Coy [place]

## 2017-11-29 NOTE — Progress Notes (Signed)
Patient remains on [facility] waiting list. Facility will call when bed is available.    LCSW will continue to follow.   Carolin Coy [place] [facility] [place]

## 2017-11-29 NOTE — Progress Notes (Signed)
PROGRESS NOTE    Hayley [name]  ZYS:[id number] DOB: July 26, 1946 DOA: 11/17/2017 PCP: [facility]    Brief Narrative:  72 year old female who presented with abdominal pain after a suicidal attempt. Patient does have a significant past medical history for depression, type 2 diabetes mellitus, COPD, coronary artery disease, hypothyroidism and bilateral paresthesias with essential tremor. Had a suicide attempt on March 1, took 30 pills of Percocet with intention to hurt herself. Positive abdominal pain, associated with nausea and vomiting, no fevers or chills. On the initial physical examination blood pressure 105 471, heart rate of 104, respiratory 16, saturation 94%.Dry mucous membranes, lungs clear to auscultation bilaterally, heart S1-S2 present rhythmic, abdomen tender in the periumbilical region, no guarding, no rebound, no lower extremity edema. Sodium 137, potassium 3.3, chloride 99, bicarb 29, glucose 207, BUN 7, creatinine 0.67, calcium 8.8, white count 10.1, human 14.9, hematocrit 45.6, platelets 247, urinalysis negative for infection.  CT of the abdomen with fat stranding adjacent to the pancreas and adjacent duodenum/duodenal diverticuli.  Chest x-ray hypoinflated.  EKG normal sinus rhythm, normal axis, normal intervals.  Patient was admitted to the hospital with the working diagnosis of acute pancreatitis complicated by a suicidal attempt.    Assessment & Plan:   Principal Problem:   MDD (major depressive episode), single episode, severe, no psychosis ([place]) Active Problems:   Pancreatitis   1.Acute pancreatitis. Continue to improve symptoms, positive bowel movement, no nausea or vomiting, dull epigastric abdominal pain, will continue antiacid therapy with bid pantoprazole and sucralfate. Pain control with oxycodone. As needed antiemetics.   2.Suicide attempt in the setting of severe depression. Continue with bupropion, and trazodone. Suicidal  precautions, one to one sitter. Will add clonazepam at night as needed. Pending placement. Patient is medically stable.   3.Urinary tract infection./ resolved.   4.Peripheral neuropathy. On gabapentin.  5.Type 2 diabetes mellitus,.Glucose coverage and monitoring with insulin sliding scale. Continue basal insulin therapy with levimir. Capillary glucose  142, 172, 171, 122, 139, 140. Continue basal insulin with glargine 10 units.   6. Hypothyroid. continue levothyroxine.   7. History of pulmonary embolism. Continue anticoagulation with apixaban.   8. HTN. Blood pressure has been low, will hold on atenolol for now.    DVT prophylaxis:apixaban Code Status:full Family Communication: No family at the bedside  Disposition Plan:psych unit   Consultants:  Psych  Procedures:    Antimicrobials:       Subjective: Patient with episodes of mild anxiety at night, improved with clonazepam, no nausea or vomiting, but positive dull, epigastric abdominal pain, moderate in intensity, with no radiation.  Objective: Vitals:   11/28/17 0649 11/28/17 1108 11/28/17 2022 11/29/17 0635  BP: 97/72 (!) 112/59 (!) 104/55 (!) 109/57  Pulse:  86 80 72  Resp:   18 18  Temp:   99 F (37.2 C) 98.5 F (36.9 C)  TempSrc:   Oral Oral  SpO2:   93% 99%  Weight:      Height:        Intake/Output Summary (Last 24 hours) at 11/29/2017 1245 Last data filed at 11/29/2017 1000 Gross per 24 hour  Intake 1437 ml  Output 0 ml  Net 1437 ml   Filed Weights   11/25/17 0417 11/27/17 0448 11/28/17 0500  Weight: 103 kg (227 lb 1.2 oz) 102.5 kg (225 lb 15.5 oz) 101.8 kg (224 lb 6.9 oz)    Examination:   General: Not in pain or dyspnea, deconditioned Neurology: Awake and alert,  non focal  E ENT: mild pallor, no icterus, oral mucosa moist Cardiovascular: No JVD. S1-S2 present, rhythmic, no gallops, rubs, or murmurs. No lower extremity edema. Pulmonary: vesicular breath sounds  bilaterally, adequate air movement, no wheezing, rhonchi or rales. Gastrointestinal. Abdomen protuberant, no organomegaly, non tender, no rebound or guarding Skin. No rashes Musculoskeletal: no joint deformities     Data Reviewed: I have personally reviewed following labs and imaging studies  CBC: No results for input(s): WBC, NEUTROABS, HGB, HCT, MCV, PLT in the last 168 hours. Basic Metabolic Panel: No results for input(s): NA, K, CL, CO2, GLUCOSE, BUN, CREATININE, CALCIUM, MG, PHOS in the last 168 hours. GFR: Estimated Creatinine Clearance: 76.6 mL/min (by C-G formula based on SCr of 0.77 mg/dL). Liver Function Tests: No results for input(s): AST, ALT, ALKPHOS, BILITOT, PROT, ALBUMIN in the last 168 hours. No results for input(s): LIPASE, AMYLASE in the last 168 hours. No results for input(s): AMMONIA in the last 168 hours. Coagulation Profile: No results for input(s): INR, PROTIME in the last 168 hours. Cardiac Enzymes: No results for input(s): CKTOTAL, CKMB, CKMBINDEX, TROPONINI in the last 168 hours. BNP (last 3 results) No results for input(s): PROBNP in the last 8760 hours. HbA1C: No results for input(s): HGBA1C in the last 72 hours. CBG: Recent Labs  Lab 11/28/17 1226 11/28/17 1814 11/28/17 2151 11/29/17 0725 11/29/17 1223  GLUCAP 172* 171* 122* 139* 140*   Lipid Profile: No results for input(s): CHOL, HDL, LDLCALC, TRIG, CHOLHDL, LDLDIRECT in the last 72 hours. Thyroid Function Tests: No results for input(s): TSH, T4TOTAL, FREET4, T3FREE, THYROIDAB in the last 72 hours. Anemia Panel: No results for input(s): VITAMINB12, FOLATE, FERRITIN, TIBC, IRON, RETICCTPCT in the last 72 hours.    Radiology Studies: I have reviewed all of the imaging during this hospital visit personally     Scheduled Meds: . acidophilus  1 capsule Oral Daily  . apixaban  2.5 mg Oral BID  . bisacodyl  10 mg Oral Daily  . buPROPion  300 mg Oral Daily  . famotidine  20 mg Oral  Daily  . gabapentin  600 mg Oral TID  . guaiFENesin  1,200 mg Oral BID  . hydrocortisone   Rectal BID  . insulin aspart  0-15 Units Subcutaneous TID WC  . insulin glargine  10 Units Subcutaneous QHS  . levothyroxine  25 mcg Oral QAC breakfast  . mometasone-formoterol  2 puff Inhalation BID  . polyethylene glycol  17 g Oral Daily  . propranolol ER  60 mg Oral Daily  . senna  1 tablet Oral QHS  . traZODone  50 mg Oral QHS   Continuous Infusions:   LOS: 12 days        Mauricio Gerome Apley, MD [facility] Pager [phone]

## 2017-11-29 NOTE — Progress Notes (Signed)
Initial Nutrition Assessment  DOCUMENTATION CODES:   Obesity unspecified  INTERVENTION:   - Provided diabetes diet education to pt and daughter - Encouraged PO intake  NUTRITION DIAGNOSIS:   Food and nutrition related knowledge deficit related to limited prior education as evidenced by per patient/family report.  GOAL:   Patient will meet greater than or equal to 90% of their needs  MONITOR:   PO intake, Labs, Weight trends, I & O's  REASON FOR ASSESSMENT:   LOS   ASSESSMENT:   72 year old female with PMH significant for DM type 2, COPD, CAD, hypothyroidism, chronic nausea and abdominal pain, goiter, anxiety, and depression. Pt had apparent suicide attempt on 11/15/17. Pt admitted for acute pancreatitis.  Spoke with pt and daughter at length. Pt requested educational materials regarding diabetes. Provided pt with "Carbohydrate Counting for People with Diabetes" and "Label Reading Tips for People with Diabetes" handouts from the Academy of Nutrition and Dietetics.  Pt states that food is "less important" since December 2018 than it used to be and that she doesn't have much of an appetite (states appetite is "nonexistent"). Pt reports having both nausea and vomiting when eating but states she hasn't experienced either in the past few days.  Pt states her UBW is "over 200 lbs." Pt unsure if she has lost weight recently. Per weight history in chart, pt has experienced a 6.1% weight loss over the past 4 months which is not significant for timeframe.  Pt endorses difficulty swallowing. Per MBS results, SLP recommended dysphagia 3 with thin liquids.  Pt states she has been followed for feasibility of bariatric surgery. Pt unsure of the plan at this time.  Pt's daughter's goal is to set pt up for success after discharge.  Meal Completion: averaging 75%  I/O: +5.5 L since admission  Medications reviewed and include: acidophilus daily, 10 mg Dulcolax daily, sliding scale Novolog,  10 units Lantus daily, 25 mcg levothyroxine daily, 40 mg Protonix BID, 17 grams Miralax daily, 8.6 grams Senokot daily, GI cocktail  Labs reviewed. CBG's: 140, 138, 122, 171 x 24 hours  NUTRITION - FOCUSED PHYSICAL EXAM:    Most Recent Value  Orbital Region  Unable to assess  Upper Arm Region  Unable to assess  Thoracic and Lumbar Region  Unable to assess  Buccal Region  Unable to assess  [place] Region  Unable to assess  Clavicle Bone Region  Unable to assess  Clavicle and Acromion Bone Region  Unable to assess  Scapular Bone Region  Unable to assess  Dorsal Hand  Unable to assess  Patellar Region  Unable to assess  Anterior Thigh Region  Unable to assess  Posterior Calf Region  Unable to assess  Edema (RD Assessment)  Unable to assess  Hair  Reviewed  Eyes  Reviewed  Mouth  Unable to assess  Skin  Reviewed  Nails  Unable to assess       Diet Order:  Suicide precautions Diet Carb Modified Fluid consistency: Thin; Room service appropriate? Yes  EDUCATION NEEDS:   Education needs have been addressed  Skin:  Skin Assessment: Reviewed RN Assessment  Last BM:  11/28/17  Height:   Ht Readings from Last 1 Encounters:  11/18/17 5\' 6"  (1.676 m)    Weight:   Wt Readings from Last 1 Encounters:  11/28/17 224 lb 6.9 oz (101.8 kg)    Ideal Body Weight:  59.1 kg  BMI:  Body mass index is 36.22 kg/m.  Estimated Nutritional Needs:   Kcal:  1700-1900 kcal/day  Protein:  70-85 grams/day  Fluid:  1.7-1.9 L/day    Gaynell Face, MS, RD, LDN Pager: [phone] Weekend/After Hours: [phone]

## 2017-11-30 LAB — GLUCOSE, CAPILLARY
Glucose-Capillary: 169 mg/dL — ABNORMAL HIGH (ref 65–99)
Glucose-Capillary: 147 mg/dL — ABNORMAL HIGH (ref 65–99)
Glucose-Capillary: 163 mg/dL — ABNORMAL HIGH (ref 65–99)
Glucose-Capillary: 154 mg/dL — ABNORMAL HIGH (ref 65–99)

## 2017-11-30 NOTE — Progress Notes (Signed)
PROGRESS NOTE    Hayley [name]  UXL:[id number] DOB: 02/07/1946 DOA: 11/17/2017 PCP: [facility]    Brief Narrative:  72 year old female who presented with abdominal pain after a suicidal attempt. Patient does have a significant past medical history for depression, type 2 diabetes mellitus, COPD, coronary artery disease, hypothyroidism and bilateral paresthesias with essential tremor. Had a suicide attempt on March 1, took 30 pills of Percocet with intention to hurt herself. Positive abdominal pain, associated with nausea and vomiting, no fevers or chills. On the initial physical examination blood pressure 105 471, heart rate of 104, respiratory 16, saturation 94%.Dry mucous membranes, lungs clear to auscultation bilaterally, heart S1-S2 present rhythmic, abdomen tender in the periumbilical region, no guarding, no rebound, no lower extremity edema. Sodium 137, potassium 3.3, chloride 99, bicarb 29, glucose 207, BUN 7, creatinine 0.67, calcium 8.8, white count 10.1, human 14.9, hematocrit 45.6, platelets 247, urinalysis negative for infection.  CT of the abdomen with fat stranding adjacent to the pancreas and adjacent duodenum/duodenal diverticuli.  Chest x-ray hypoinflated.  EKG normal sinus rhythm, normal axis, normal intervals.  Patient was admitted to the hospital with the working diagnosis of acute pancreatitis complicated by a suicidal attempt.     Assessment & Plan:   Principal Problem:   MDD (major depressive episode), single episode, severe, no psychosis ([place]) Active Problems:   Pancreatitis   1.Acute pancreatitis.Positive bowel movement, responding well to antiacid therapy with bid pantoprazole and sucralfate. Oxycodone for pain control.   2.Suicide attempt in the setting of severe depression.On  bupropion,andtrazodone. Continue suicidal precautions, one to one sitter.Tolerating well clonazepam at night as needed. Patient frustrated about  placement, will reach to psychiatry team for other discharge alternatives if continue to lack of bed availability at nursing facility.   3.Urinary tract infection./ resolved.  4.Peripheral neuropathy.Continue with gabapentin.  5.Type 2 diabetes mellitus,.Continueinsulin sliding scale plus basal insulin therapy with glargine 10 units.  Capillary glucose  145, 168, 169, 147, 163.   6. Hypothyroid.On levothyroxine.   7. History of pulmonary embolism.Anticoagulation with apixaban.   8. HTN. On atenolol, blood pressure 106/60      DVT prophylaxis:apixaban Code Status:full Family Communication:No family at the bedside  Disposition Plan:psych unit   Consultants:  Psych  Procedures:    Antimicrobials:       Subjective: Patient is feeling frustrated of being in the hospital, improved abdominal discomfort, no nausea or vomiting. Denies being suicidal.   Objective: Vitals:   11/29/17 2106 11/29/17 2118 11/30/17 0639 11/30/17 1435  BP:  104/61 100/60 106/60  Pulse:  86 100 (!) 114  Resp: 18 18 20 20   Temp:  98.2 F (36.8 C) 98.2 F (36.8 C) 98.6 F (37 C)  TempSrc:  Oral Oral Oral  SpO2:  95% 92% 93%  Weight:   103.1 kg (227 lb 4.7 oz)   Height:        Intake/Output Summary (Last 24 hours) at 11/30/2017 1457 Last data filed at 11/30/2017 1100 Gross per 24 hour  Intake 730 ml  Output -  Net 730 ml   Filed Weights   11/27/17 0448 11/28/17 0500 11/30/17 0639  Weight: 102.5 kg (225 lb 15.5 oz) 101.8 kg (224 lb 6.9 oz) 103.1 kg (227 lb 4.7 oz)    Examination:   General: Not in pain or dyspnea, Neurology: Awake and alert, non focal  E ENT: no pallor, no icterus, oral mucosa moist Cardiovascular: No JVD. S1-S2 present, rhythmic, no gallops, rubs, or  murmurs. No lower extremity edema. Pulmonary: vesicular breath sounds bilaterally, adequate air movement, no wheezing, rhonchi or rales. Gastrointestinal. Abdomen protuberant, no  organomegaly, non tender, no rebound or guarding Skin. No rashes Musculoskeletal: no joint deformities     Data Reviewed: I have personally reviewed following labs and imaging studies  CBC: No results for input(s): WBC, NEUTROABS, HGB, HCT, MCV, PLT in the last 168 hours. Basic Metabolic Panel: No results for input(s): NA, K, CL, CO2, GLUCOSE, BUN, CREATININE, CALCIUM, MG, PHOS in the last 168 hours. GFR: Estimated Creatinine Clearance: 77.1 mL/min (by C-G formula based on SCr of 0.77 mg/dL). Liver Function Tests: No results for input(s): AST, ALT, ALKPHOS, BILITOT, PROT, ALBUMIN in the last 168 hours. No results for input(s): LIPASE, AMYLASE in the last 168 hours. No results for input(s): AMMONIA in the last 168 hours. Coagulation Profile: No results for input(s): INR, PROTIME in the last 168 hours. Cardiac Enzymes: No results for input(s): CKTOTAL, CKMB, CKMBINDEX, TROPONINI in the last 168 hours. BNP (last 3 results) No results for input(s): PROBNP in the last 8760 hours. HbA1C: No results for input(s): HGBA1C in the last 72 hours. CBG: Recent Labs  Lab 11/29/17 1223 11/29/17 1714 11/29/17 2123 11/30/17 0721 11/30/17 1127  GLUCAP 140* 145* 168* 169* 147*   Lipid Profile: No results for input(s): CHOL, HDL, LDLCALC, TRIG, CHOLHDL, LDLDIRECT in the last 72 hours. Thyroid Function Tests: No results for input(s): TSH, T4TOTAL, FREET4, T3FREE, THYROIDAB in the last 72 hours. Anemia Panel: No results for input(s): VITAMINB12, FOLATE, FERRITIN, TIBC, IRON, RETICCTPCT in the last 72 hours.    Radiology Studies: I have reviewed all of the imaging during this hospital visit personally     Scheduled Meds: . acidophilus  1 capsule Oral Daily  . apixaban  2.5 mg Oral BID  . bisacodyl  10 mg Oral Daily  . buPROPion  300 mg Oral Daily  . gabapentin  600 mg Oral TID  . guaiFENesin  1,200 mg Oral BID  . hydrocortisone   Rectal BID  . insulin aspart  0-15 Units  Subcutaneous TID WC  . insulin glargine  10 Units Subcutaneous QHS  . levothyroxine  25 mcg Oral QAC breakfast  . mometasone-formoterol  2 puff Inhalation BID  . pantoprazole  40 mg Oral BID  . polyethylene glycol  17 g Oral Daily  . senna  1 tablet Oral QHS  . sucralfate  1 g Oral TID WC & HS  . traZODone  50 mg Oral QHS   Continuous Infusions:   LOS: 13 days        Mauricio Gerome Apley, MD [facility] Pager [phone]

## 2017-12-01 DIAGNOSIS — E1142 Type 2 diabetes mellitus with diabetic polyneuropathy: Secondary | ICD-10-CM

## 2017-12-01 DIAGNOSIS — I10 Essential (primary) hypertension: Secondary | ICD-10-CM

## 2017-12-01 LAB — GLUCOSE, CAPILLARY
Glucose-Capillary: 133 mg/dL — ABNORMAL HIGH (ref 65–99)
Glucose-Capillary: 251 mg/dL — ABNORMAL HIGH (ref 65–99)
Glucose-Capillary: 188 mg/dL — ABNORMAL HIGH (ref 65–99)
Glucose-Capillary: 204 mg/dL — ABNORMAL HIGH (ref 65–99)

## 2017-12-01 MED ORDER — SUMATRIPTAN SUCCINATE 25 MG PO TABS
25.0000 mg | ORAL_TABLET | ORAL | Status: DC | PRN
  Administered 2017-12-01: 25 mg via ORAL
  Filled 2017-12-01: qty 1

## 2017-12-01 NOTE — Progress Notes (Signed)
CSW following to assist with discharge planning to inpatient psychiatric hospital.  [facility] contacted [name] to follow up about patient's status on wait list. CSW spoke with staff member Helene [facility] who reported that patient is still on their wait list and they currently have no beds available.   CSW will continue to seek inpatient psychiatric placement for patient.  Abundio Miu, [place] Social Worker [facility] Cell#: [phone]

## 2017-12-01 NOTE — Progress Notes (Signed)
PROGRESS NOTE    Hayley [name]  AST:[id number] DOB: 03-Nov-1945 DOA: 11/17/2017 PCP: [facility]    Brief Narrative:  72 year old female who presented with abdominal pain after a suicidal attempt. Patient does have a significant past medical history for depression, type 2 diabetes mellitus, COPD, coronary artery disease, hypothyroidism and bilateral paresthesias with essential tremor. Had a suicide attempt on March 1, took 30 pills of Percocet with intention to hurt herself. Positive abdominal pain, associated with nausea and vomiting, no fevers or chills. On the initial physical examination blood pressure 105 471, heart rate of 104, respiratory 16, saturation 94%.Dry mucous membranes, lungs clear to auscultation bilaterally, heart S1-S2 present rhythmic, abdomen tender in the periumbilical region, no guarding, no rebound, no lower extremity edema.Sodium 137, potassium 3.3, chloride 99, bicarb 29, glucose 207, BUN 7, creatinine 0.67, calcium 8.8,white count 10.1, human 14.9, hematocrit 45.6, platelets 247,urinalysis negative for infection.CT of the abdomen with fat stranding adjacent to the pancreas and adjacentduodenum/duodenal diverticuli.Chest x-ray hypoinflated. EKG normal sinus rhythm, normal axis, normal intervals.  Patient was admitted to the hospital with the working diagnosis of acute pancreatitis complicated by a suicidal attempt.  Assessment & Plan:   Principal Problem:   MDD (major depressive episode), single episode, severe, no psychosis ([place]) Active Problems:   Pancreatitis  1.Acute pancreatitis, it has resolved, patient tolerating po well, with no nausea or vomiting.  2.Suicide attempt in the setting of severe depression. Continue with bupropion,cloanzepam andtrazodone. Suicidal precautions, with one to one sitter.Waiting for placement at psych unit.   3.Urinary tract infection./ resolved.  4.Peripheral neuropathy.  Ongabapentin.  5.Type 2 diabetes mellitus. Basal insulin therapy with glargine 10 units.  Capillary glucose 163, 154, 133, 251, 188. Tolerating po well. Continue sliding scale insulin for glucose cover and monitoring.   6. Hypothyroid.Conintinue with levothyroxine.   7. History of pulmonary embolism.Anticoagulation with apixaban, with good toleration.   8. HTN. Continue with atenolol, systolic blood pressure in the 100's     DVT prophylaxis:apixaban Code Status:full Family Communication:No family at the bedside  Disposition Plan:psych unit   Consultants:  Psych  Procedures:    Antimicrobials:      Subjective: Patient continue to be frustrated about no facility for discharge. Had developed migrane headache, no nausea or vomiting, positive joint pains.   Objective: Vitals:   12/01/17 0638 12/01/17 0905 12/01/17 1035 12/01/17 1038  BP: 110/68     Pulse: 98 (!) 102    Resp: 20 18    Temp: 98.8 F (37.1 C)     TempSrc: Oral     SpO2: 94% 93% 95% 95%  Weight:      Height:        Intake/Output Summary (Last 24 hours) at 12/01/2017 1402 Last data filed at 12/01/2017 1147 Gross per 24 hour  Intake 720 ml  Output -  Net 720 ml   Filed Weights   11/27/17 0448 11/28/17 0500 11/30/17 0639  Weight: 102.5 kg (225 lb 15.5 oz) 101.8 kg (224 lb 6.9 oz) 103.1 kg (227 lb 4.7 oz)    Examination:   General: Not in pain or dyspnea Neurology: Awake and alert, non focal  E ENT: no pallor, no icterus, oral mucosa moist Cardiovascular: No JVD. S1-S2 present, rhythmic, no gallops, rubs, or murmurs. No lower extremity edema. Pulmonary: vesicular breath sounds bilaterally, adequate air movement, no wheezing, rhonchi or rales. Gastrointestinal. Abdomen protuberant no organomegaly, non tender, no rebound or guarding Skin. No rashes Musculoskeletal: no joint deformities  Data Reviewed: I have personally reviewed following labs and imaging  studies  CBC: No results for input(s): WBC, NEUTROABS, HGB, HCT, MCV, PLT in the last 168 hours. Basic Metabolic Panel: No results for input(s): NA, K, CL, CO2, GLUCOSE, BUN, CREATININE, CALCIUM, MG, PHOS in the last 168 hours. GFR: Estimated Creatinine Clearance: 77.1 mL/min (by C-G formula based on SCr of 0.77 mg/dL). Liver Function Tests: No results for input(s): AST, ALT, ALKPHOS, BILITOT, PROT, ALBUMIN in the last 168 hours. No results for input(s): LIPASE, AMYLASE in the last 168 hours. No results for input(s): AMMONIA in the last 168 hours. Coagulation Profile: No results for input(s): INR, PROTIME in the last 168 hours. Cardiac Enzymes: No results for input(s): CKTOTAL, CKMB, CKMBINDEX, TROPONINI in the last 168 hours. BNP (last 3 results) No results for input(s): PROBNP in the last 8760 hours. HbA1C: No results for input(s): HGBA1C in the last 72 hours. CBG: Recent Labs  Lab 11/30/17 1127 11/30/17 1720 11/30/17 2209 12/01/17 0800 12/01/17 1228  GLUCAP 147* 163* 154* 133* 251*   Lipid Profile: No results for input(s): CHOL, HDL, LDLCALC, TRIG, CHOLHDL, LDLDIRECT in the last 72 hours. Thyroid Function Tests: No results for input(s): TSH, T4TOTAL, FREET4, T3FREE, THYROIDAB in the last 72 hours. Anemia Panel: No results for input(s): VITAMINB12, FOLATE, FERRITIN, TIBC, IRON, RETICCTPCT in the last 72 hours.    Radiology Studies: I have reviewed all of the imaging during this hospital visit personally     Scheduled Meds: . acidophilus  1 capsule Oral Daily  . apixaban  2.5 mg Oral BID  . bisacodyl  10 mg Oral Daily  . buPROPion  300 mg Oral Daily  . gabapentin  600 mg Oral TID  . guaiFENesin  1,200 mg Oral BID  . hydrocortisone   Rectal BID  . insulin aspart  0-15 Units Subcutaneous TID WC  . insulin glargine  10 Units Subcutaneous QHS  . levothyroxine  25 mcg Oral QAC breakfast  . mometasone-formoterol  2 puff Inhalation BID  . pantoprazole  40 mg Oral  BID  . polyethylene glycol  17 g Oral Daily  . senna  1 tablet Oral QHS  . sucralfate  1 g Oral TID WC & HS  . traZODone  50 mg Oral QHS   Continuous Infusions:   LOS: 14 days        Khayri Kargbo Gerome Apley, MD [facility] Pager [phone]

## 2017-12-01 NOTE — Progress Notes (Signed)
Checked frequently at intervals with eyes closed, resp even and unlabored. Sitter remains at bedside. No acute distress noted. Uneventful night.

## 2017-12-01 NOTE — Plan of Care (Signed)
  Skin Integrity: Risk for impaired skin integrity will decrease 12/01/2017 2310 - Completed/Met by Mickie Kay, RN

## 2017-12-01 NOTE — Progress Notes (Signed)
Pt refused her Dulera inhaler. Pt has been refusing for days. No distress noted at this time. Pt has bedside sitter.

## 2017-12-02 DIAGNOSIS — E1165 Type 2 diabetes mellitus with hyperglycemia: Secondary | ICD-10-CM

## 2017-12-02 LAB — GLUCOSE, CAPILLARY
Glucose-Capillary: 150 mg/dL — ABNORMAL HIGH (ref 65–99)
Glucose-Capillary: 202 mg/dL — ABNORMAL HIGH (ref 65–99)

## 2017-12-02 MED ORDER — TRAZODONE HCL 50 MG PO TABS: 50.0000 mg | ORAL_TABLET | Freq: Every day | ORAL | 0 refills | Status: DC

## 2017-12-02 MED ORDER — BUPROPION HCL ER (XL) 300 MG PO TB24: 300.0000 mg | ORAL_TABLET | Freq: Every day | ORAL | 0 refills | Status: DC

## 2017-12-02 MED ORDER — PROPRANOLOL HCL 60 MG PO TABS: 60.0000 mg | ORAL_TABLET | Freq: Two times a day (BID) | ORAL | 0 refills | Status: DC

## 2017-12-02 MED ORDER — PROPRANOLOL HCL 20 MG PO TABS
60.0000 mg | ORAL_TABLET | Freq: Two times a day (BID) | ORAL | Status: DC
  Administered 2017-12-02: 60 mg via ORAL
  Filled 2017-12-02: qty 3

## 2017-12-02 NOTE — Progress Notes (Signed)
LCSW met with family.   Family is willing to provide support for patient.   LCSW followed up with Psychiatrist. Psych is willing to clear patient with family support and outpatient follow up.  Patient has an appointment with [facility] 3/19 at 2:30.   LCSW notified attending.   LCSW followed up with patient and family.   Carolin Coy [place] [facility] [place]

## 2017-12-02 NOTE — Care Management Important Message (Signed)
Important Message  Patient Details  Name: Hayley [name] MRN: [id number] Date of Birth: [birth year]   Medicare Important Message Given:  Yes    Kerin Salen 12/02/2017, 10:37 AMImportant Message  Patient Details  Name: Hayley [name] MRN: [id number] Date of Birth: [birth year]   Medicare Important Message Given:  Yes    Kerin Salen 12/02/2017, 10:37 AM

## 2017-12-02 NOTE — Progress Notes (Signed)
LCSw following for disposition.   Patient remains on waiting list at [facility]. Facility does not anticipate any beds today.   LCSW meeting with family at 10:30.   LCSW will continue to follow.   Carolin Coy [place] [facility] [place]

## 2017-12-02 NOTE — Discharge Summary (Addendum)
Physician Discharge Summary  Hayley [name] IEP:[id number] DOB: October 03, 1945 DOA: 11/17/2017  PCP: [facility]  Admit date: 11/17/2017 Discharge date: 12/02/2017  Admitted From: Home Disposition:  Home  Recommendations for Outpatient Follow-up and new medication changes:  1. Follow up with PCP in 1- week 2. Patient has been determined by psychiatry to be safe to discharge home. 3. Continue Wellbutrin and trazodone. 4. Outpatient follow-up with psychiatry  Home Health: no  Equipment/Devices: NO    Discharge Condition: stable  CODE STATUS: Full  Diet recommendation:  Heart healthy  Brief/Interim Summary: 72 year old female who presented with abdominal pain after a suicidal attempt. Patient does have a significant past medical history for depression, type 2 diabetes mellitus, COPD, coronary artery disease, hypothyroidism and bilateral paresthesias with essential tremor. Had a suicide attempt on March 1, took 30 pills of Percocet with intention to hurt herself. Positive abdominal pain, associated with nausea and vomiting, no fevers or chills. On the initial physical examination blood pressure 660 systolic, heart rate of 630, respiratory 16, saturation 94%.Dry mucous membranes, lungs clear to auscultation bilaterally, heart S1-S2 present rhythmic, abdomen tender in the periumbilical region, no guarding, no rebound, no lower extremity edema.Sodium 137, potassium 3.3, chloride 99, bicarb 29, glucose 207, BUN 7, creatinine 0.67, calcium 8.8,white count 10.1, hemoglobin 14.9, hematocrit 45.6, platelets 247,urinalysis negative for infection.CT of the abdomen with fat stranding adjacent to the pancreas and adjacentduodenum/duodenal diverticuli.Chest x-ray hypoinflated. EKG normal sinus rhythm, normal axis, normal intervals.  Patient was admitted to the hospital with the working diagnosis of acute pancreatitis complicated by a suicidal attempt.  1.  Acute pancreatitis.   Patient was admitted to the medical ward, she was placed on IV fluids, nothing by mouth, IV analgesics, and as needed IV antiemetics.  Patient responded well to the medical therapy, her diet was advanced progressively with coloration.  Patient had history of cholecystectomy in the past.  2.  Suicidal attempt in the setting of depression.  Patient was placed on suicide precautions, one-to-one sitter, psychiatric consultation.  Patient was continued with Wellbutrin and trazodone.  She required clonazepam during her hospitalization.  Initially she was referred for inpatient psychiatric facility, her condition improved in the hospital and she was deemed by psychiatry to be stable and safe to be discharged home.   3.  Urinary tract infection (present on admission).  It resolved.  Treated with antibiotic therapy in the hospital.  4.  Peripheral neuropathy.  Continue gabapentin.  5.  Type 2 diabetes mellitus .  Patient was continued on insulin sliding scale for glucose correction monitor, Her glucose remained well controlled.  Patient will resume glipizide and Januvia at discharge.  6.  Hypothyroid.  Continue levothyroxine  7.  History of pulmonary embolism.  Continue anticoagulation with apixaban.   Discharge Diagnoses:  Principal Problem:   MDD (major depressive episode), single episode, severe, no psychosis ([place]) Active Problems:   Pancreatitis    Discharge Instructions   Allergies as of 12/02/2017      Reactions   Bee Venom Anaphylaxis   Tape Rash   Rash with paper tape   Etodolac Diarrhea, Nausea And Vomiting   Morphine Other (See Comments)   Hallucinations   Other Itching, Swelling   Poison Ivy Extract  [poison Ivy Extract] Itching, Swelling   Propoxyphene Nausea And Vomiting   Propoxyphene N-acetaminophen Nausea And Vomiting   Penicillins Rash   Has patient had a PCN reaction causing immediate rash, facial/tongue/throat swelling, SOB or lightheadedness with hypotension:  Yes Has  patient had a PCN reaction causing severe rash involving mucus membranes or skin necrosis: No Has patient had a PCN reaction that required hospitalization No Has patient had a PCN reaction occurring within the last 10 years: No If all of the above answers are "NO", then may proceed with Cephalosporin use.      Medication List    STOP taking these medications   Acidophilus/Goat Milk Caps   clonazePAM 0.5 MG tablet Commonly known as:  KLONOPIN   cyclobenzaprine 10 MG tablet Commonly known as:  FLEXERIL   DULoxetine 60 MG capsule Commonly known as:  CYMBALTA   metroNIDAZOLE 500 MG tablet Commonly known as:  FLAGYL   ondansetron 4 MG disintegrating tablet Commonly known as:  ZOFRAN-ODT   oxyCODONE-acetaminophen 5-325 MG tablet Commonly known as:  PERCOCET/ROXICET   promethazine 25 MG tablet Commonly known as:  PHENERGAN   terconazole 0.4 % vaginal cream Commonly known as:  TERAZOL 7     TAKE these medications   albuterol 108 (90 Base) MCG/ACT inhaler Commonly known as:  PROVENTIL HFA;VENTOLIN HFA Inhale 1 puff into the lungs every 6 (six) hours as needed for wheezing or shortness of breath.   albuterol (2.5 MG/3ML) 0.083% nebulizer solution Commonly known as:  PROVENTIL Take 2.5 mg by nebulization every 6 (six) hours as needed for wheezing or shortness of breath. Reported on 01/10/2016   apixaban 2.5 MG Tabs tablet Commonly known as:  ELIQUIS Take 1 tablet (2.5 mg total) by mouth 2 (two) times daily.   benzonatate 100 MG capsule Commonly known as:  TESSALON Take 100 mg by mouth 2 (two) times daily as needed for cough.   BIOTIN PO Take 1 capsule by mouth daily.   BOTOX 200 units Solr Generic drug:  Botulinum Toxin Type A Inject as directed every 3 (three) months.   buPROPion 300 MG 24 hr tablet Commonly known as:  WELLBUTRIN XL Take 1 tablet (300 mg total) by mouth daily for 15 days.   cyanocobalamin 1000 MCG/ML injection Commonly known as:  (VITAMIN  B-12) Inject 1 mL into the skin every 30 (thirty) days. What changed:  Another medication with the same name was removed. Continue taking this medication, and follow the directions you see here.   diphenhydrAMINE 25 MG tablet Commonly known as:  BENADRYL Take 50 mg by mouth every 6 (six) hours as needed for itching.   gabapentin 300 MG capsule Commonly known as:  NEURONTIN TAKE 2 CAPSULES (600 MG TOTAL) BY MOUTH 3 (THREE) TIMES DAILY. What changed:  Another medication with the same name was removed. Continue taking this medication, and follow the directions you see here.   glipiZIDE 5 MG 24 hr tablet Commonly known as:  GLUCOTROL XL Take 5 mg by mouth daily with breakfast.   levothyroxine 25 MCG tablet Commonly known as:  SYNTHROID, LEVOTHROID Take 25 mcg by mouth every morning.   mometasone-formoterol 100-5 MCG/ACT Aero Commonly known as:  DULERA Inhale 2 puffs into the lungs 2 (two) times daily.   propranolol 60 MG tablet Commonly known as:  INDERAL Take 1 tablet (60 mg total) by mouth 2 (two) times daily.   sitaGLIPtin 100 MG tablet Commonly known as:  JANUVIA Take 100 mg by mouth daily.   SUMAtriptan Succinate Refill 4 MG/0.5ML Soct Inject 4 mg into the skin as needed. Every 3 months   SUMAtriptan 25 MG tablet Commonly known as:  IMITREX Take 1 tablet (25 mg total) by mouth every 2 (two) hours  as needed for migraine. May repeat in 2 hours if headache persists or recurs.   traZODone 50 MG tablet Commonly known as:  DESYREL Take 1 tablet (50 mg total) by mouth at bedtime for 15 days.   valACYclovir 1000 MG tablet Commonly known as:  VALTREX Take 2,000 mg by mouth 2 (two) times daily as needed (Fever blisters). Reported on 01/10/2016   VIRTUSSIN A/C 100-10 MG/5ML syrup Generic drug:  guaiFENesin-codeine Take 5 mLs by mouth every 6 (six) hours as needed for cough.      [place]. Go on 12/03/2017.   Why:  Appointment  scheduled at 2:30pm. Please arrive at 1:30 to complete paperwork.  Contact information: [place] [phone]         Allergies  Allergen Reactions  . Bee Venom Anaphylaxis  . Tape Rash    Rash with paper tape  . Etodolac Diarrhea and Nausea And Vomiting  . Morphine Other (See Comments)    Hallucinations  . Other Itching and Swelling  . Poison Ivy Extract  [Poison Ivy Extract] Itching and Swelling  . Propoxyphene Nausea And Vomiting  . Propoxyphene N-Acetaminophen Nausea And Vomiting  . Penicillins Rash    Has patient had a PCN reaction causing immediate rash, facial/tongue/throat swelling, SOB or lightheadedness with hypotension: Yes Has patient had a PCN reaction causing severe rash involving mucus membranes or skin necrosis: No Has patient had a PCN reaction that required hospitalization No Has patient had a PCN reaction occurring within the last 10 years: No If all of the above answers are "NO", then may proceed with Cephalosporin use.     Consultations:  Psychiatry   Procedures/Studies: Ct Head Wo Contrast  Result Date: 11/17/2017 CLINICAL DATA:  Patient with nausea and vomiting. Declined mental status. Contrast was given prior to study or abdomen pelvis CT. EXAM: CT HEAD WITHOUT CONTRAST TECHNIQUE: Contiguous axial images were obtained from the base of the skull through the vertex without intravenous contrast. COMPARISON:  Brain CT 03/22/2016. FINDINGS: Brain: The ventricles and sulci are appropriate for patient's age. No evidence for acute cortically based infarct, intracranial hemorrhage, mass lesion or mass-effect. Vascular: Unremarkable. Skull: Intact. Sinuses/Orbits: Paranasal sinuses are well aerated. Mastoid air cells are unremarkable. Orbits are unremarkable. Other: None. IMPRESSION: No acute intracranial process. Electronically Signed   By: Lovey Newcomer M.D.   On: 11/17/2017 18:11   Ct Angio Chest Pe W And/or Wo  Contrast  Result Date: 11/17/2017 CLINICAL DATA:  72 year old female with history of shortness of breath and chest pain. EXAM: CT ANGIOGRAPHY CHEST WITH CONTRAST TECHNIQUE: Multidetector CT imaging of the chest was performed using the standard protocol during bolus administration of intravenous contrast. Multiplanar CT image reconstructions and MIPs were obtained to evaluate the vascular anatomy. CONTRAST:  60 mL ISOVUE-370 IOPAMIDOL (ISOVUE-370) INJECTION 76% COMPARISON:  Chest CT 08/02/2017. FINDINGS: Cardiovascular: There are no filling defects within the pulmonary arterial tree to suggest underlying pulmonary embolism. Heart size is normal. There is no significant pericardial fluid, thickening or pericardial calcification. There is aortic atherosclerosis, as well as atherosclerosis of the great vessels of the mediastinum and the coronary arteries, including calcified atherosclerotic plaque in the left main and left anterior descending coronary arteries. Mediastinum/Nodes: No pathologically enlarged mediastinal or hilar lymph nodes. Esophagus is unremarkable in appearance. No axillary lymphadenopathy. Lungs/Pleura: A few scattered areas of subsegmental atelectasis and/or scarring are noted in the lung bases bilaterally. No consolidative airspace disease. No  pleural effusions. No suspicious appearing pulmonary nodules or masses are noted. Upper Abdomen: Diffuse low attenuation throughout the visualized portions of the liver, indicative of severe hepatic steatosis. Status post cholecystectomy. Musculoskeletal: Bilateral breast implants incidentally noted with dense capsular calcifications on the right side. There are no aggressive appearing lytic or blastic lesions noted in the visualized portions of the skeleton. Spinal cord stimulator in the midthoracic region. Review of the MIP images confirms the above findings. IMPRESSION: 1. No evidence of pulmonary embolism. 2. No acute findings are noted in the thorax to  account for the patient's symptoms. 3. Aortic atherosclerosis, in addition to left main and left anterior descending coronary artery disease. Assessment for potential risk factor modification, dietary therapy or pharmacologic therapy may be warranted, if clinically indicated. 4. Severe hepatic steatosis. Aortic Atherosclerosis (ICD10-I70.0). Electronically Signed   By: Vinnie Langton M.D.   On: 11/17/2017 18:13   Dg Chest Bilateral Decubitus  Result Date: 11/20/2017 CLINICAL DATA:  Cough today.  Follow-up atelectasis. EXAM: CHEST - BILATERAL DECUBITUS VIEW COMPARISON:  Portable chest 11/19/2017 and chest CT 11/17/2017. FINDINGS: Bilateral decubitus views demonstrate no significant layering pleural fluid. The non dependent lung appears adequately aerated on these views. Spinal stimulator noted. IMPRESSION: No significant layering pleural fluid or focal airspace disease. Electronically Signed   By: Richardean Sale M.D.   On: 11/20/2017 11:11   Ct Abdomen Pelvis W Contrast  Result Date: 11/17/2017 CLINICAL DATA:  Mid to lower abdominal pain for 2 or 3 weeks. EXAM: CT ABDOMEN AND PELVIS WITH CONTRAST TECHNIQUE: Multidetector CT imaging of the abdomen and pelvis was performed using the standard protocol following bolus administration of intravenous contrast. CONTRAST:  13mL ISOVUE-300 IOPAMIDOL (ISOVUE-300) INJECTION 61% COMPARISON:  December 09, 2014 FINDINGS: Lower chest: No acute abnormality. Hepatobiliary: Hepatic steatosis. Previous cholecystectomy. The portal vein is patent. The liver is otherwise normal. Pancreas: There is fat stranding near the head of the pancreas. The remainder of the pancreas is normal. Spleen: Normal in size without focal abnormality. Adrenals/Urinary Tract: Adrenal glands are unremarkable. Kidneys are normal, without renal calculi, focal lesion, or hydronephrosis. Bladder is unremarkable. Stomach/Bowel: The stomach is normal. There is inflammation of the second portion of the duodenum.  There are duodenal diverticuli in this region. The inflammation is thought to be secondary to pancreatitis rather than from the duodenum given an elevated lipase. The small bowel is otherwise normal. Colonic diverticulosis without diverticulitis. The appendix is not visualized. Vascular/Lymphatic: Mild atherosclerotic change in the abdominal aorta. No aneurysm or dissection. Reproductive: Status post hysterectomy. No adnexal masses. Other: No abdominal wall hernia or abnormality. No abdominopelvic ascites. Musculoskeletal: No acute or significant osseous findings. IMPRESSION: 1. Fat stranding adjacent to the pancreas and adjacent duodenum/duodenal diverticuli is thought to be secondary to pancreatitis given the patient's elevated lipase. 2. Colonic diverticulosis without diverticulitis. 3. Atherosclerotic change in the abdominal aorta. Electronically Signed   By: Dorise Bullion III M.D   On: 11/17/2017 17:48   Dg Chest Port 1 View  Result Date: 11/19/2017 CLINICAL DATA:  72 year old female admitted with acute pancreatitis, chest pain, shortness of breath. EXAM: PORTABLE CHEST 1 VIEW COMPARISON:  CTA chest and CT Abdomen and Pelvis 11/17/2017, and earlier. FINDINGS: Portable AP semi upright view at 0825 hours. Stable lung volumes. Stable mediastinal contours with mildly tortuous thoracic aorta. Thoracic spinal stimulator is stable. Mild bilateral hypo ventilation appears fairly chronic and stable. No superimposed pneumothorax, pulmonary edema, pleural effusion or other confluent pulmonary opacity. Paucity bowel gas in the  upper abdomen. IMPRESSION: 1.  No acute cardiopulmonary abnormality. 2. Mild chronic lung base atelectasis. Electronically Signed   By: Genevie Ann M.D.   On: 11/19/2017 08:48   Dg Abdomen Acute W/chest  Result Date: 11/17/2017 CLINICAL DATA:  Vomiting, shortness of breath. EXAM: DG ABDOMEN ACUTE W/ 1V CHEST COMPARISON:  None. FINDINGS: Dilated small bowel loops are noted in left upper quadrant.  No colonic dilatation is noted. No radiopaque calculi or other significant radiographic abnormality is seen. Heart size and mediastinal contours are within normal limits. Both lungs are clear. IMPRESSION: Dilated small bowel loops seen in left upper quadrant of abdomen concerning for distal small bowel obstruction or focal ileus; follow-up radiographs are recommended. No acute cardiopulmonary disease. Electronically Signed   By: Marijo Conception, M.D.   On: 11/17/2017 15:41   Dg Abd Portable 2v  Result Date: 11/19/2017 CLINICAL DATA:  Persistent nausea.  Abdominal pain. EXAM: PORTABLE ABDOMEN - 2 VIEW COMPARISON:  CT abdomen pelvis 11/17/2017. FINDINGS: Lung bases are clear. Spinal stimulator device. Gas is demonstrated within nondilated loops of large and small bowel. No free intraperitoneal air. IMPRESSION: Nonobstructed bowel gas pattern. Electronically Signed   By: Lovey Newcomer M.D.   On: 11/19/2017 09:34   Dg Swallowing Func-speech Pathology  Result Date: 11/20/2017 Objective Swallowing Evaluation: Type of Study: MBS-Modified Barium Swallow Study  Patient Details Name: TIMISHA MONDRY MRN: [id number] Date of Birth: 07/08/1946 Today's Date: 11/20/2017 Time: SLP Start Time (ACUTE ONLY): 1310 -SLP Stop Time (ACUTE ONLY): 1326 SLP Time Calculation (min) (ACUTE ONLY): 16 min Past Medical History: Past Medical History: Diagnosis Date . Anemia  . Anxiety  . Bronchitis   hx of  . Clotting disorder ([place]) 02-17-14  S/p appendectomy-developed Pulmonary emboli-tx. warfarin-d/c 1 yr ago. . Colon polyps  . Depression  . Diabetes mellitus without complication ([place])   "borderline"- oral med . Disorder of vocal cords   spasmotic dysphonia ,02-17-14 has" whispery voice-low tone" . Hyperthyroidism  . Kidney stones  . Migraines  . Nausea Nov 15, 2014  cycle of migraine headaches and nausea . Neuromuscular disorder ([place])   hands and throat"spasmodic dysphonia", tremors - thimb and forefinger  . Obesity, Class III, BMI 40-49.9 (morbid  obesity) ([place]) 04/27/2010  02-17-14 reports some weight loss- intentional . Osteoarthritis   in back(chronic pain) . Pelvic pain   02-17-14 "states thinks its back related on left groin" . Pulmonary emboli [facility])   s/p Appendectomy '13 [facility] . Reflux  . Right knee pain  . Sleep apnea   cpap - settings at 4  . Toxic goiter  Past Surgical History: Past Surgical History: Procedure Laterality Date . ABDOMINAL HYSTERECTOMY   . APPENDECTOMY    3'13 [facility] s/p developed  Pulmonary Emboli . BOTOX INJECTION    for migranes . CATARACT EXTRACTION, BILATERAL Bilateral 03/2013  . CHOLECYSTECTOMY   . HAND SURGERY  2004  both hands . KIDNEY STONE SURGERY   . KNEE ARTHROSCOPY  1998  right . LAPAROSCOPIC APPENDECTOMY  11/27/2011  Procedure: APPENDECTOMY LAPAROSCOPIC;  Surgeon: Adin Hector, MD;  Location: [facility] ORS;  Service: General;  Laterality: N/A; . SPINAL CORD STIMULATOR IMPLANT  06/04/2016  Dr. Alfonse Ras at [facility] . TOTAL KNEE ARTHROPLASTY Right 12/06/2014  Procedure: RIGHT TOTAL KNEE ARTHROPLASTY;  Surgeon: Gaynelle Arabian, MD;  Location: [facility] ORS;  Service: Orthopedics;  Laterality: Right; . TOTAL KNEE REVISION Left 02/24/2014  Procedure: LEFT TOTAL KNEE REVISION;  Surgeon: Gearlean Alf, MD;  Location: [facility];  Service: Orthopedics;  Laterality: Left; . vocal cord surgery  02-17-14  01-12-14 done in [place] [facility](s/p selective denervation/reinnervation recurrent laryngeal nerve surgery) . zenkers diverticulum   HPI: 72 yo female with PMH significant for depression, DM2, COPD, CAD, hypothyroidism, essential tremor who presented to the ED after attempting suicide by ingesting 30 pills of percocet. Tachycardic on presentation. CT abd pelvis revealed suspicion for acute pancreatitis. Pt with hx of spasmodic dysphonia.  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 October 2018  exam by ENT revealed known abductor spasmodic dysphonia tremor and laryngeal candidiasis with purulent secretions covering the mucosa of the larynx and pharynx.  Pt was seen by Dr. Lake Bells from pulmonology in Nov 2018 for evaluation of cough.  He referred Mrs. Templin for OP MBS due to concerns for potential silent aspiration; however, she missed her scheduled appointment in November because of her husband's stroke.   Subjective: communicative, friendly Assessment / Plan / Recommendation CHL IP CLINICAL IMPRESSIONS 11/20/2017 Clinical Impression Pt presents with a normal oropharyngeal swallow with adequate mastication, timely swallow response, good pharyngeal clearance of POs, no penetration/aspiration, despite large consecutive thin liquid boluses.  Notable was retention of barium immediately inferior to cricopharyngeus - residue remained at least one minute after completion of study.  Pt viewed video and we discussed results.   Pt may benefit from further esophageal w/u at some point. Continue diet per pt's preferences; thin liquids; meds whole in puree.  No SLP f/u warranted.  SLP Visit Diagnosis Dysphagia, pharyngoesophageal phase (R13.14) Attention and concentration deficit following -- Frontal lobe and executive function deficit following -- Impact on safety and function No limitations   CHL IP TREATMENT RECOMMENDATION 11/20/2017 Treatment Recommendations No treatment recommended at this time   No flowsheet data found. CHL IP DIET RECOMMENDATION 11/20/2017 SLP Diet Recommendations Dysphagia 3 (Mech soft) solids;Thin liquid Liquid Administration via Cup;Straw Medication Administration Whole meds with liquid Compensations -- Postural Changes --   CHL IP OTHER RECOMMENDATIONS 11/20/2017 Recommended Consults Consider esophageal assessment Oral Care Recommendations -- Other Recommendations --   CHL IP FOLLOW UP RECOMMENDATIONS 11/20/2017 Follow up Recommendations None   CHL IP FREQUENCY AND DURATION 03/23/2016 Speech Therapy  Frequency (ACUTE ONLY) min 2x/week Treatment Duration --      CHL IP ORAL PHASE 11/20/2017 Oral Phase WFL Oral - Pudding Teaspoon -- Oral - Pudding Cup -- Oral - Honey Teaspoon -- Oral - Honey Cup -- Oral - Nectar Teaspoon -- Oral - Nectar Cup -- Oral - Nectar Straw -- Oral - Thin Teaspoon -- Oral - Thin Cup -- Oral - Thin Straw -- Oral - Puree -- Oral - Mech Soft -- Oral - Regular -- Oral - Multi-Consistency -- Oral - Pill -- Oral Phase - Comment --  CHL IP PHARYNGEAL PHASE 11/20/2017 Pharyngeal Phase WFL Pharyngeal- Pudding Teaspoon -- Pharyngeal -- Pharyngeal- Pudding Cup -- Pharyngeal -- Pharyngeal- Honey Teaspoon -- Pharyngeal -- Pharyngeal- Honey Cup -- Pharyngeal -- Pharyngeal- Nectar Teaspoon -- Pharyngeal -- Pharyngeal- Nectar Cup -- Pharyngeal -- Pharyngeal- Nectar Straw -- Pharyngeal -- Pharyngeal- Thin Teaspoon -- Pharyngeal -- Pharyngeal- Thin Cup -- Pharyngeal -- Pharyngeal- Thin Straw -- Pharyngeal -- Pharyngeal- Puree -- Pharyngeal -- Pharyngeal- Mechanical Soft -- Pharyngeal -- Pharyngeal- Regular -- Pharyngeal -- Pharyngeal- Multi-consistency -- Pharyngeal -- Pharyngeal- Pill -- Pharyngeal -- Pharyngeal Comment --  CHL IP CERVICAL ESOPHAGEAL PHASE 11/20/2017 Cervical Esophageal Phase Impaired  Pudding Teaspoon -- Pudding Cup -- Honey Teaspoon -- Honey Cup -- Nectar Teaspoon -- Nectar Cup -- Nectar Straw -- Thin Teaspoon -- Thin Cup -- Thin Straw -- Puree -- Mechanical Soft -- Regular -- Multi-consistency -- Pill -- Cervical Esophageal Comment retention of barium directly inferior to PES CHL IP GO 03/23/2016 Functional Assessment Tool Used NOMS Functional Limitations Spoken language expressive Swallow Current Status [phone]) (None) Swallow Goal Status (G2542) (None) Swallow Discharge Status (H0623) (None) Motor Speech Current Status (J6283) (None) Motor Speech Goal Status (T5176) (None) Motor Speech Goal Status (H6073) (None) Spoken Language Comprehension Current Status (X1062) (None) Spoken Language  Comprehension Goal Status (I9485) (None) Spoken Language Comprehension Discharge Status [phone]) (None) Spoken Language Expression Current Status (J5009) CJ Spoken Language Expression Goal Status (F8182) CI Spoken Language Expression Discharge Status [phone]) (None) Attention Current Status (I9678) (None) Attention Goal Status (L3810) (None) Attention Discharge Status (F7510) (None) Memory Current Status (C5852) (None) Memory Goal Status (D7824) (None) Memory Discharge Status (M3536) (None) Voice Current Status (R4431) (None) Voice Goal Status (V4008) (None) Voice Discharge Status (Q7619) (None) Other Speech-Language Pathology Functional Limitation Current Status ([id number]) (None) Other Speech-Language Pathology Functional Limitation Goal Status ([id number]) (None) Other Speech-Language Pathology Functional Limitation Discharge Status [phone]) (None) Juan Quam Laurice 11/20/2017, 1:42 PM                  Subjective: Patient is feeling better, no nausea, no vomiting, abdominal pain has improved.  Discharge Exam: Vitals:   12/02/17 0603 12/02/17 1300  BP: (!) 147/93 111/70  Pulse: 90 (!) 105  Resp: 16 18  Temp: 98.9 F (37.2 C) 99.2 F (37.3 C)  SpO2: 94% 95%   Vitals:   12/01/17 2156 12/02/17 0603 12/02/17 0655 12/02/17 1300  BP:  (!) 147/93  111/70  Pulse:  90  (!) 105  Resp: 18 16  18   Temp:  98.9 F (37.2 C)  99.2 F (37.3 C)  TempSrc:  Oral  Oral  SpO2:  94%  95%  Weight:   108.5 kg (239 lb 3.2 oz)   Height:        General: Not in pain or dyspnea.  Neurology: Awake and alert, non focal  E ENT: mild pallor, no icterus, oral mucosa moist Cardiovascular: No JVD. S1-S2 present, rhythmic, no gallops, rubs, or murmurs. No lower extremity edema. Pulmonary: vesicular breath sounds bilaterally, adequate air movement, no wheezing, rhonchi or rales. Gastrointestinal. Abdomen protuberant, no organomegaly, non tender, no rebound or guarding Skin. No rashes Musculoskeletal: no joint  deformities   The results of significant diagnostics from this hospitalization (including imaging, microbiology, ancillary and laboratory) are listed below for reference.     Microbiology: No results found for this or any previous visit (from the past 240 hour(s)).   Labs: BNP (last 3 results) Recent Labs    11/17/17 2213  BNP 83.3   Basic Metabolic Panel: No results for input(s): NA, K, CL, CO2, GLUCOSE, BUN, CREATININE, CALCIUM, MG, PHOS in the last 168 hours. Liver Function Tests: No results for input(s): AST, ALT, ALKPHOS, BILITOT, PROT, ALBUMIN in the last 168 hours. No results for input(s): LIPASE, AMYLASE in the last 168 hours. No results for input(s): AMMONIA in the last 168 hours. CBC: No results for input(s): WBC, NEUTROABS, HGB, HCT, MCV, PLT in the last 168 hours. Cardiac Enzymes: No results for input(s): CKTOTAL, CKMB, CKMBINDEX, TROPONINI in the last 168 hours. BNP: Invalid input(s): POCBNP CBG: Recent Labs  Lab 12/01/17 1228 12/01/17 1633 12/01/17 2115  12/02/17 0808 12/02/17 1157  GLUCAP 251* 188* 204* 150* 202*   D-Dimer No results for input(s): DDIMER in the last 72 hours. Hgb A1c No results for input(s): HGBA1C in the last 72 hours. Lipid Profile No results for input(s): CHOL, HDL, LDLCALC, TRIG, CHOLHDL, LDLDIRECT in the last 72 hours. Thyroid function studies No results for input(s): TSH, T4TOTAL, T3FREE, THYROIDAB in the last 72 hours.  Invalid input(s): FREET3 Anemia work up No results for input(s): VITAMINB12, FOLATE, FERRITIN, TIBC, IRON, RETICCTPCT in the last 72 hours. Urinalysis    Component Value Date/Time   COLORURINE STRAW (A) 11/20/2017 2000   APPEARANCEUR CLEAR 11/20/2017 2000   APPEARANCEUR CLOUDY 12/19/2014 0730   LABSPEC 1.006 11/20/2017 2000   LABSPEC 1.025 12/19/2014 0730   PHURINE 8.0 11/20/2017 2000   GLUCOSEU 50 (A) 11/20/2017 2000   GLUCOSEU see comment 12/19/2014 0730   HGBUR NEGATIVE 11/20/2017 [place]  NEGATIVE 11/20/2017 2000   BILIRUBINUR n 05/08/2016 1506   BILIRUBINUR see comment 12/19/2014 0730   KETONESUR NEGATIVE 11/20/2017 2000   PROTEINUR NEGATIVE 11/20/2017 2000   UROBILINOGEN negative 05/08/2016 1506   UROBILINOGEN 1.0 11/30/2014 1215   NITRITE NEGATIVE 11/20/2017 2000   LEUKOCYTESUR LARGE (A) 11/20/2017 2000   LEUKOCYTESUR see comment 12/19/2014 0730   Sepsis Labs Invalid input(s): PROCALCITONIN,  WBC,  LACTICIDVEN Microbiology No results found for this or any previous visit (from the past 240 hour(s)).   Time coordinating discharge: 45 minutes  SIGNED:   Tawni Millers, MD  [facility] 12/02/2017, 2:12 PM Pager [phone]  If 7PM-7AM, please contact night-coverage [email or web address] Password [email or web address]

## 2017-12-03 ENCOUNTER — Other Ambulatory Visit (HOSPITAL_COMMUNITY): Payer: Medicare Other | Attending: Psychiatry | Admitting: Licensed Clinical Social Worker

## 2017-12-03 DIAGNOSIS — F332 Major depressive disorder, recurrent severe without psychotic features: Secondary | ICD-10-CM | POA: Diagnosis not present

## 2017-12-03 DIAGNOSIS — Z9049 Acquired absence of other specified parts of digestive tract: Secondary | ICD-10-CM | POA: Diagnosis not present

## 2017-12-03 DIAGNOSIS — Z9889 Other specified postprocedural states: Secondary | ICD-10-CM | POA: Diagnosis not present

## 2017-12-03 DIAGNOSIS — F321 Major depressive disorder, single episode, moderate: Secondary | ICD-10-CM

## 2017-12-04 NOTE — Psych (Signed)
Comprehensive Clinical Assessment (CCA) Note  12/04/2017 RETINA BERNARDY [id number]  Visit Diagnosis:      ICD-10-CM   1. Current moderate episode of major depressive disorder, unspecified whether recurrent ([place]) F32.1       CCA Part One  Part One has been completed on paper by the patient.  (See scanned document in Chart Review)  CCA Part Two A  Intake/Chief Complaint:  CCA Intake With Chief Complaint CCA Part Two Date: 12/03/17 CCA Part Two Time: 78 Chief Complaint/Presenting Problem: Pt presents as referral from ED seeking intensive services. Pt reports that life came to a head on 11/14/17 and she made a plan to kill herself. Pt states she had "never even thought about it for one second" prior to that day, however created a well thoughtout plan and carried it out within a few days. Pt attempted by ODing on her prescription Oxycodone and was found 1-2 days later by her husband who took her to the hospital. Pt is not very clear on what stressors came to a point, however shares that her husband is frail aging, they had a number of financial issues, and she was trying to come to terms with recognizing her husband might die before she does. ED notes show pt was in the ED/hospital for 15 days due to her attempt and medical complications. Pt  was originally recommended psychiatric inpatient however while waiting for a bed was demmed appropriate for outpatient treatment. Pt states while in the hospital her husband was moved out of their house to live with his daughter, her children have become involved and want to take over her decision making, and that "there is no trust in me anywhere." Pt states the children were upset that she and her husband did not share with them how bad things were. Pt has a strong faith system and reports since her attempt she has a renweed faith in God and reports she "will definitely not ever" attempt again as she is sure God has a plan for her and a reason for why she is  still here. Pt is talkative and tangential. It is difficult to get clear answers and timeline. Pt denies SI/HI currently.  Patients Currently Reported Symptoms/Problems: Depression symptoms, financial stress, familial stress, concerns with independence Collateral Involvement: EPIC notes Individual's Strengths: Pt has a strong faith, has some family support, is motivated for treatment.  Type of Services Patient Feels Are Needed: Intensive treatment  Mental Health Symptoms Depression:  Depression: Tearfulness, Change in energy/activity, Hopelessness  Mania:  Mania: N/A  Anxiety:   Anxiety: N/A  Psychosis:  Psychosis: N/A  Trauma:  Trauma: N/A  Obsessions:  Obsessions: N/A  Compulsions:  Compulsions: N/A  Inattention:  Inattention: N/A  Hyperactivity/Impulsivity:  Hyperactivity/Impulsivity: N/A  Oppositional/Defiant Behaviors:  Oppositional/Defiant Behaviors: N/A  Borderline Personality:  Emotional Irregularity: N/A  Other Mood/Personality Symptoms:      Mental Status Exam Appearance and self-care  Stature:  Stature: Average  Weight:  Weight: Overweight  Clothing:  Clothing: Casual  Grooming:  Grooming: Normal  Cosmetic use:  Cosmetic Use: Age appropriate  Posture/gait:  Posture/Gait: Slumped  Motor activity:  Motor Activity: Not Remarkable  Sensorium  Attention:  Attention: Distractible  Concentration:  Concentration: Scattered  Orientation:  Orientation: X5  Recall/memory:  Recall/Memory: Normal  Affect and Mood  Affect:  Affect: Labile  Mood:  Mood: Anxious  Relating  Eye contact:  Eye Contact: Normal  Facial expression:  Facial Expression: Responsive  Attitude toward examiner:  Attitude  Toward Examiner: Cooperative  Thought and Language  Speech flow: Speech Flow: Normal  Thought content:  Thought Content: Appropriate to mood and circumstances  Preoccupation:     Hallucinations:  Hallucinations: Visual(Pt states 2 experiences of VH in the month prior to admit, denies past  or since 1) seeing eyes across the street, calling the police to check it out, finding nothing. 2) seeing bed bugs in bedding, collecting it, and exterminator says it was "litter.")  Organization:     Transport planner of Knowledge:  Fund of Knowledge: Average  Intelligence:  Intelligence: Average  Abstraction:  Abstraction: Functional  Judgement:  Judgement: Fair  Art therapist:  Reality Testing: Adequate  Insight:  Insight: Fair  Decision Making:  Decision Making: Vacilates  Social Functioning  Social Maturity:     Social Judgement:     Stress  Stressors:  Stressors: Family conflict, Grief/losses, Chiropodist, Transitions  Coping Ability:  Coping Ability: English as a second language teacher Deficits:     Supports:      Family and Psychosocial History: Family history Marital status: Married Number of Years Married: 67 What types of issues is patient dealing with in the relationship?: aging, finances Additional relationship information: Pt states she and her husband are separated "as of 3/1" per their children who decided it was for the best. Information is hazy at this time Does patient have children?: Yes How many children?: 2 How is patient's relationship with their children?: Pt states it is currently strained as they don't "trust" her. Pt reports being estranged from her daughter prior to pt's attempt and now daughter/son-in-law are main supports. Pt's son lives in [place] and is attempting to play a role in treatment from [place].  Childhood History:  Childhood History By whom was/is the patient raised?: (Unable to determine) Does patient have siblings?: Yes Number of Siblings: 2 Description of patient's current relationship with siblings: 2 sisters, deceased Did patient suffer any verbal/emotional/physical/sexual abuse as a child?: Yes Did patient suffer from severe childhood neglect?: No Has patient ever been sexually abused/assaulted/raped as an adolescent or adult?: No Was the  patient ever a victim of a crime or a disaster?: No Witnessed domestic violence?: No Has patient been effected by domestic violence as an adult?: No  CCA Part Two B  Employment/Work Situation: Employment / Work Copywriter, advertising Employment situation: Retired Archivist job has been impacted by current illness: No Has patient ever been in the [employer]?: No Has patient ever served in combat?: No Did You Receive Any Psychiatric Treatment/Services While in Passenger transport manager?: No Are There Guns or Other Weapons in [place]?: No  Education: Education Did Teacher, adult education From [employer]?: Yes Did Physicist, medical?: Yes What Type of College Degree Do you Have?: Merchandiser, retail Did You Have An Individualized Education Program (IIEP): No Did You Have Any Difficulty At [employer]?: No  Religion: Religion/Spirituality Are You A Religious Person?: Yes What is Your Religious Affiliation?: Christian How Might This Affect Treatment?: Pt states that her faith is strong and "the only reason to be here"  Leisure/Recreation: Leisure / Recreation Leisure and Hobbies: Pt states she does not have any  Exercise/Diet: Exercise/Diet Do You Exercise?: No Have You Gained or Lost A Significant Amount of Weight in the Past Six Months?: No Do You Follow a Special Diet?: No Do You Have Any Trouble Sleeping?: No  CCA Part Two C  Alcohol/Drug Use: Alcohol / Drug Use Pain Medications: Oxycodone for back pain Prescriptions: see MAR Over the Counter: Pt denies  History of alcohol / drug use?: No history of alcohol / drug abuse      CCA Part Three  ASAM's:  Six Dimensions of Multidimensional Assessment  Dimension 1:  Acute Intoxication and/or Withdrawal Potential:     Dimension 2:  Biomedical Conditions and Complications:     Dimension 3:  Emotional, Behavioral, or Cognitive Conditions and Complications:     Dimension 4:  Readiness to Change:     Dimension 5:  Relapse, Continued use, or Continued Problem  Potential:     Dimension 6:  Recovery/Living Environment:      Substance use Disorder (SUD)    Social Function:     Stress:  Stress Stressors: Family conflict, Grief/losses, Chiropodist, Transitions Coping Ability: Overwhelmed Patient Takes Medications The Way The Doctor Instructed?: Yes(Pt states she is currently med compliant, however OD'd on her medication 3 weeks ago) Priority Risk: Moderate Risk  Risk Assessment- Self-Harm Potential: Risk Assessment For Self-Harm Potential Thoughts of Self-Harm: No current thoughts Method: No plan Availability of Means: No access/NA Additional Information for Self-Harm Potential: Previous Attempts  Risk Assessment -Dangerous to Others Potential: Risk Assessment For Dangerous to Others Potential Method: No Plan Availability of Means: No access or NA Intent: Vague intent or NA Notification Required: No need or identified person  DSM5 Diagnoses: Patient Active Problem List   Diagnosis Date Noted  . MDD (major depressive episode), single episode, severe, no psychosis ([place])   . Pancreatitis 11/17/2017  . Writer's cramp 01/02/2017  . Neck pain on left side 09/26/2016  . Tremor 06/20/2016  . Obesity, Class II, BMI 35-39.9, isolated (see actual BMI) 04/09/2016  . UTI (lower urinary tract infection)   . Paresthesia of both lower extremities 03/22/2016  . Vertigo 03/22/2016  . Essential tremor 03/12/2016  . Fall 03/12/2016  . Low back pain 03/12/2016  . Coagulopathy ([place]) 07/20/2015  . Diabetes mellitus without complication ([place]) 79/10/4095  . Essential hypertension 06/03/2015  . OA (osteoarthritis) of knee 12/06/2014  . Hypothyroidism 03/03/2014  . Asthma 03/03/2014  . Acute blood loss anemia 03/03/2014  . Left knee pain 03/03/2014  . Failed total knee arthroplasty ([place]) 02/24/2014  . OSA on CPAP 12/23/2013  . History of pulmonary embolus (PE) 12/21/2013  . 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 Obesity, morbid ([place]) 04/27/2010  . ANEMIA  04/27/2010  . ANXIETY DEPRESSION 04/27/2010  . Chronic migraine 04/27/2010  . NEUROPATHY 04/27/2010  . HEMORRHOIDS 04/27/2010  . GERD 04/27/2010  . DIVERTICULOSIS, COLON 04/27/2010  . IBS 04/27/2010  . CHOLELITHIASIS 04/27/2010  . RENAL CALCULUS 04/27/2010    Patient Centered Plan: Patient is on the following Treatment Plan(s): Pt will engage in IOP Recommendations for Services/Supports/Treatments: Recommendations for Services/Supports/Treatments Recommendations For Services/Supports/Treatments: IOP (Intensive Outpatient Program)(Pt is recommended IOP  for further stabilization of depression and to gain support while transitioning to real life. )  Treatment Plan Summary: Pt will engage in IOP    Referrals to Alternative Service(s): Referred to Alternative Service(s):   Place:   Date:   Time:    Referred to Alternative Service(s):   Place:   Date:   Time:    Referred to Alternative Service(s):   Place:   Date:   Time:    Referred to Alternative Service(s):   Place:   Date:   Time:     Lorin Glass MSW, LCSW, LCAS

## 2017-12-06 ENCOUNTER — Other Ambulatory Visit: Payer: Self-pay

## 2017-12-06 DIAGNOSIS — F332 Major depressive disorder, recurrent severe without psychotic features: Secondary | ICD-10-CM | POA: Diagnosis not present

## 2017-12-06 NOTE — Patient Outreach (Signed)
[facility]) Care Management  12/06/2017  BRIENNE LIGUORI 02-23-1946 [id number]  EMMI: General discharge red alert Referral date: 12/06/17 Referral reason: new prescriptions: " I don't know" Day # 1 Attempt #1  Telephone call to patient regarding EMMI general discharge red alert. Unable to reach patient. HIPAA compliant voice message left with call back phone number.   PLAN: RNCM will attempt 2nd telephone call to patient in 4 business days. RNCM will send patient outreach letter to attempt contact.   [name] RN,BSN,CCM [facility] Telephonic  [phone]

## 2017-12-09 ENCOUNTER — Other Ambulatory Visit: Payer: Self-pay | Admitting: *Deleted

## 2017-12-09 ENCOUNTER — Other Ambulatory Visit (HOSPITAL_COMMUNITY): Payer: Medicare Other | Admitting: Psychiatry

## 2017-12-09 ENCOUNTER — Encounter (HOSPITAL_COMMUNITY): Payer: Self-pay | Admitting: Psychiatry

## 2017-12-09 DIAGNOSIS — F332 Major depressive disorder, recurrent severe without psychotic features: Secondary | ICD-10-CM

## 2017-12-09 DIAGNOSIS — Z9049 Acquired absence of other specified parts of digestive tract: Secondary | ICD-10-CM | POA: Diagnosis not present

## 2017-12-09 DIAGNOSIS — D689 Coagulation defect, unspecified: Secondary | ICD-10-CM

## 2017-12-09 DIAGNOSIS — Z9889 Other specified postprocedural states: Secondary | ICD-10-CM | POA: Diagnosis not present

## 2017-12-09 NOTE — Progress Notes (Signed)
Hayley [name] is a 72 y.o., married, Caucasian female who was a referral from ED seeking intensive services. Pt reports that life came to a head on 11/14/17 and she made a plan to kill herself. Pt states she had "never even thought about it for one second" prior to that day, however created a well thoughtout plan and carried it out within a few days. Pt attempted by ODing on her prescription Oxycodone and was found 1-2 days later by her husband who took her to the hospital. Pt is not very clear on what stressors came to a point, however shares that her husband of 52 yrs marriage is frail aging (Dec. 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 According to pt, he recently had a severe fall within their home.  ED notes show pt was in the ED/hospital for 15 days due to her attempt and medical complications. Pt  was originally recommended psychiatric inpatient however while waiting for a bed was demmed appropriate for outpatient treatment. Pt states while in the hospital her husband was moved out of their house to live with his oldest daughter, her children have become involved and want to take over her decision making, and that "there is no trust in me anywhere." Pt states the children were upset that she and her husband did not share with them how bad things were. Pt has a strong faith system and reports since her attempt she has a renweed faith in God and reports she "will definitely not ever" attempt again as she is sure God has a plan for her and a reason for why she is still here. Pt is talkative and tangential. It is difficult to get clear answers and timeline. Pt denies SI/HI currently.  Also, denies A/V hallucinations or drug/ETOH issues. A:  Oriented pt to MH-IOP.  Provided patient with an orientation folder.  Encouraged support groups.  Discussed  other options ([facility] and individual counseling) if MH-IOP doesn't work out for pt.  R:  Pt receptive.         Carlis Abbott, RITA, M.Ed, CNA

## 2017-12-10 ENCOUNTER — Other Ambulatory Visit (HOSPITAL_COMMUNITY): Payer: Medicare Other | Admitting: Psychiatry

## 2017-12-10 ENCOUNTER — Other Ambulatory Visit: Payer: Self-pay

## 2017-12-10 DIAGNOSIS — Z86010 Personal history of colonic polyps: Secondary | ICD-10-CM | POA: Diagnosis not present

## 2017-12-10 DIAGNOSIS — K5900 Constipation, unspecified: Secondary | ICD-10-CM | POA: Diagnosis not present

## 2017-12-10 DIAGNOSIS — F332 Major depressive disorder, recurrent severe without psychotic features: Secondary | ICD-10-CM | POA: Diagnosis not present

## 2017-12-10 DIAGNOSIS — Z9049 Acquired absence of other specified parts of digestive tract: Secondary | ICD-10-CM | POA: Diagnosis not present

## 2017-12-10 DIAGNOSIS — R1033 Periumbilical pain: Secondary | ICD-10-CM | POA: Diagnosis not present

## 2017-12-10 DIAGNOSIS — Z9889 Other specified postprocedural states: Secondary | ICD-10-CM | POA: Diagnosis not present

## 2017-12-10 DIAGNOSIS — K5730 Diverticulosis of large intestine without perforation or abscess without bleeding: Secondary | ICD-10-CM | POA: Diagnosis not present
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
## 2017-12-11 ENCOUNTER — Other Ambulatory Visit (HOSPITAL_COMMUNITY): Payer: Medicare Other | Admitting: Psychiatry

## 2017-12-11 ENCOUNTER — Other Ambulatory Visit: Payer: Self-pay

## 2017-12-11 DIAGNOSIS — Z9049 Acquired absence of other specified parts of digestive tract: Secondary | ICD-10-CM | POA: Diagnosis not present

## 2017-12-11 DIAGNOSIS — Z9889 Other specified postprocedural states: Secondary | ICD-10-CM | POA: Diagnosis not present

## 2017-12-11 DIAGNOSIS — F332 Major depressive disorder, recurrent severe without psychotic features: Secondary | ICD-10-CM | POA: Diagnosis not present

## 2017-12-11 NOTE — Patient Outreach (Signed)
[facility]) Care Management  12/11/2017  YOSHINO BROCCOLI [birth year] [id number]   EMMI: General discharge red alert Referral date: 12/06/17 Referral reason: new prescriptions: " I don't know" Day # 1 Attempt #1  Telephone call to patient regarding EMMI general discharge red alert. Attempted listed mobile and home number. Unable to reach patient. HIPAA compliant voice messages left with call back phone number.   PLAN: RNCM will attempt 3rd telephone call to patient in 4 business days.   [name] RN,BSN,CCM [facility] Telephonic  [phone]
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
## 2017-12-12 ENCOUNTER — Ambulatory Visit: Payer: Self-pay | Admitting: Oncology

## 2017-12-12 ENCOUNTER — Other Ambulatory Visit (HOSPITAL_COMMUNITY): Payer: Medicare Other | Admitting: Psychiatry

## 2017-12-12 ENCOUNTER — Other Ambulatory Visit: Payer: Self-pay

## 2017-12-12 DIAGNOSIS — F332 Major depressive disorder, recurrent severe without psychotic features: Secondary | ICD-10-CM | POA: Diagnosis not present

## 2017-12-12 DIAGNOSIS — Z9049 Acquired absence of other specified parts of digestive tract: Secondary | ICD-10-CM | POA: Diagnosis not present

## 2017-12-12 DIAGNOSIS — Z9889 Other specified postprocedural states: Secondary | ICD-10-CM | POA: Diagnosis not present

## 2017-12-12 NOTE — Patient Outreach (Signed)
[facility]) Care Management  12/12/2017  Hayley [name] March 31, 1946 [id number]  EMMI:General discharge red alert Referral date:12/06/17 Referral reason:new prescriptions: " I don't know" Day #1  Return call received from patient. HIPAA verified. Discussed EMMI general discharge follow up with patient. Patient states, " I have come off of a number of medications."  "the place I am now is pain in my abdomen, back and kidney."  Patient reports she has saw her primary MD after discharge from the hospital. Patient states doctor gave her a shot for urinary tract infection. Patient reports this has not helped her pain.  Patient states she has also had pain related to her bowel movements and has had some nausea without vomiting. Patient states she has seen her gastroenterologist who prescribed her linzess. Patient states this has caused her some stomachl upset and is not working for her. Patient states she will be calling her gastroenterologist to follow up. Patient states has been taking trazodone for sleep at night.  Patient states this has helped her very much with sleep. Patient states she only had a small number of pills.  Patient states she was advised by the intensive therapist to follow up with her primary doctor to refill this prescription. Patient reports she was scheduled to see her primary MD on yesterday 12/11/17 but missed her appointment due to oversleeping. Patient states she will need to reschedule.   RNCM advised patient to follow up with her primary MD regarding her pain concerns and trazodone medication. RNCM advised patient to contact her gastroenterologist office to inform them the Linzess is causing her stomach upset. Patient verbalized understanding.   Patient reports she is currently in behavioral health intensive outpatient treatment and is not able to complete call at this time. Patient verbalized agreement to [facility] returning call.   PLAN; RNCM will attempt 2nd  telephone call to patient within 4 business days.   [name] RN,BSN,CCM [facility] Telephonic  [phone]

## 2017-12-12 NOTE — Progress Notes (Signed)
    Daily Group Progress Note  Program: IOP  Group Time: 9:00-12:00  Participation Level: Active  Behavioral Response: Appropriate  Type of Therapy:  Group Therapy  Summary of Progress:  Pt. Presented as talkative, engaged in the group process. Pt. Complained of back pain and discomfort due to IBS. Pt. Participated in grief and loss group with the Chaplain. Pt. Participated in presentation about community resources presented by the mental health association.       Nancie Neas, LPC

## 2017-12-12 NOTE — Patient Outreach (Signed)
[facility]) Care Management  12/12/2017  Hayley [name] 11-02-1945 [id number]  Hayley [name] 16-Dec-1945 [id number]  EMMI: General discharge red alert Referral date: 12/06/17 Referral reason: new prescriptions: " I don't know" Day # 1 Attempt #3  Received return call/ voice mail message from patient. Telephone call attempt to patient regarding EMMI general discharge red alert. Unable to reach patient. HIPAA compliant voice message left with call back phone number.   PLAN: If no return call from patient will proceed with case closure.   [name] RN,BSN,CCM [facility] Telephonic  [phone]

## 2017-12-13 ENCOUNTER — Other Ambulatory Visit (HOSPITAL_COMMUNITY): Payer: Medicare Other | Admitting: Psychiatry

## 2017-12-13 DIAGNOSIS — F332 Major depressive disorder, recurrent severe without psychotic features: Secondary | ICD-10-CM | POA: Diagnosis not present

## 2017-12-13 DIAGNOSIS — F322 Major depressive disorder, single episode, severe without psychotic features: Secondary | ICD-10-CM

## 2017-12-13 DIAGNOSIS — Z9889 Other specified postprocedural states: Secondary | ICD-10-CM | POA: Diagnosis not present

## 2017-12-13 DIAGNOSIS — Z9049 Acquired absence of other specified parts of digestive tract: Secondary | ICD-10-CM | POA: Diagnosis not present

## 2017-12-13 NOTE — Progress Notes (Signed)
    Daily Group Progress Note  Program: IOP  Group Time: 9:00-12:00  Participation Level: Active  Behavioral Response: Appropriate  Type of Therapy:  Group Therapy   Summary of Progress: The first part of group today was facilitated by the pharmacist focusing on the various medications that are prescribes to patients with depression, anxiety, and bipolar.  The pharmacist addressed the patients concerns about their medications, side effects and concerns that they had.  The second part of group was facilitated by the therapist addressing the different issues that each member brought to group. The patient shared some family issues with group concerning her husband, who had a stroke two years ago and how that has transformed her life and her relationship with her husband.  Her husband had to go live with his older son.  She learned to set boundaries with family members in order to help herself cope with the changes that have taken place in her life.  She shared a lot of words of wisdom and encouragement with group.  She shared that her faith, personal experiences, strength and hope have made her stronger.   The patient is new to group.  She was very active in group and shared great insights with group.  Her behavioral response was appropriate.   Nancie Neas, LPC

## 2017-12-14 NOTE — Progress Notes (Signed)
Charted in error.

## 2017-12-16 ENCOUNTER — Other Ambulatory Visit (HOSPITAL_COMMUNITY): Payer: Medicare Other | Attending: Psychiatry | Admitting: Psychiatry

## 2017-12-16 DIAGNOSIS — F332 Major depressive disorder, recurrent severe without psychotic features: Secondary | ICD-10-CM | POA: Insufficient documentation

## 2017-12-16 DIAGNOSIS — F322 Major depressive disorder, single episode, severe without psychotic features: Secondary | ICD-10-CM

## 2017-12-16 DIAGNOSIS — Z915 Personal history of self-harm: Secondary | ICD-10-CM | POA: Insufficient documentation

## 2017-12-16 NOTE — Progress Notes (Signed)
    Daily Group Progress Note  Program: IOP  Group Time: 9:00-12:00 Participation Level: Active Behavioral Response: Sharing, Appropriate Type of Therapy:  Group Summary of Progress: The group session focused on themes involving topics for rationalizing and processing external events outside of the group members' control. The second half of the group involved a psychoeducation wellness presentation along with answering questions pertaining to health and wellness. Patient opened up to the group about her marital situation. Feels saddened and uncomfortable with the lack of her husband's presence due to him being removed from their household along with their dog. Pt. stated feeling as though she is "just there," and feels out of place and alone in the house. Patient was supportively confronted about finding self-acceptance and allowing herself to express her sadness; however, she feels as though she is not ready to at the moment.  Nancie Neas, LPC

## 2017-12-16 NOTE — Progress Notes (Signed)
    Daily Group Progress Note  Program: IOP  Group Time: 9:00-12:00  Participation Level: Active  Behavioral Response: Appropriate  Type of Therapy:  Group Therapy  Summary of Progress:  Pt. Presented as talkative, engaged in the group process, appropriately tearful. Pt. continues to process separation from her husband and poor communication with her children and stepchildren that has created a great sense of loss. Pt. Is beginning to acknowledge her losses and recognizing her sadness as a grief process. Pt. Participated and was attentive during reflective reading and suggestions for meditation.      Nancie Neas, LPC

## 2017-12-17 ENCOUNTER — Ambulatory Visit: Payer: Self-pay

## 2017-12-17 ENCOUNTER — Telehealth: Payer: Self-pay | Admitting: Oncology

## 2017-12-17 ENCOUNTER — Other Ambulatory Visit (HOSPITAL_COMMUNITY): Payer: Medicare Other | Admitting: Psychiatry

## 2017-12-17 ENCOUNTER — Ambulatory Visit: Payer: Self-pay | Admitting: Psychiatry

## 2017-12-17 DIAGNOSIS — F332 Major depressive disorder, recurrent severe without psychotic features: Secondary | ICD-10-CM | POA: Diagnosis not present

## 2017-12-17 DIAGNOSIS — Z915 Personal history of self-harm: Secondary | ICD-10-CM | POA: Diagnosis not present

## 2017-12-17 DIAGNOSIS — F322 Major depressive disorder, single episode, severe without psychotic features: Secondary | ICD-10-CM
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
## 2017-12-17 NOTE — Telephone Encounter (Signed)
Left message for patient with new Date/Time/Prov per 4/2 phone msg

## 2017-12-18 ENCOUNTER — Telehealth: Payer: Self-pay | Admitting: Adult Health

## 2017-12-18 ENCOUNTER — Other Ambulatory Visit (HOSPITAL_COMMUNITY): Payer: Medicare Other | Admitting: Psychiatry

## 2017-12-18 ENCOUNTER — Ambulatory Visit: Payer: Self-pay | Admitting: Oncology

## 2017-12-18 ENCOUNTER — Other Ambulatory Visit: Payer: Self-pay
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
## 2017-12-18 NOTE — Telephone Encounter (Signed)
tried to call regarding voicemail °

## 2017-12-18 NOTE — Patient Outreach (Signed)
[facility]) Care Management  12/18/2017  Hayley [name] [birth year] [id number]   EMMI:General discharge red alert Referral date:12/06/17 Referral reason:new prescriptions: " I don't know" Day #1 Attempt #1   Telephone call attempt to patientregarding EMMI general discharge red alert. Unable to reach patient. HIPAA compliant voice message left with call back phone number.  PLAN: RNCM will attempt 2nd telephone call to patient within 4 business days. Outreach letter sent  [name] RN,BSN,CCM [facility] Telephonic  [phone]

## 2017-12-19 ENCOUNTER — Other Ambulatory Visit: Payer: Self-pay

## 2017-12-19 ENCOUNTER — Other Ambulatory Visit (HOSPITAL_COMMUNITY): Payer: Medicare Other | Admitting: Psychiatry

## 2017-12-19 DIAGNOSIS — F322 Major depressive disorder, single episode, severe without psychotic features: Secondary | ICD-10-CM

## 2017-12-19 DIAGNOSIS — F332 Major depressive disorder, recurrent severe without psychotic features: Secondary | ICD-10-CM | POA: Diagnosis not present

## 2017-12-19 DIAGNOSIS — Z915 Personal history of self-harm: Secondary | ICD-10-CM | POA: Diagnosis not present

## 2017-12-19 NOTE — Progress Notes (Signed)
    Daily Group Progress Note  Program: IOP  Group Time: 9:00-12:00  Participation Level: Active  Behavioral Response: Appropriate  Type of Therapy:  Group Therapy  Summary of Progress: Pt. Presented as talkative, engaged in the group process. Pt. Discussed her expectations regarding her planned family session on tomorrow. Pt. Indicated that she is at peace with the outcome of the family session and believes that her children and step-children want the best for her. Pt. Offered and received feedback from the group. Pt. Participated in yoga therapy with Jan Fireman, LPC, RYT     Nancie Neas, LPC

## 2017-12-19 NOTE — Patient Outreach (Signed)
[facility]) Care Management  12/19/2017  Hayley [name] 1946/03/10 [id number]   EMMI:General discharge red alert Referral date:12/06/17 Referral reason:new prescriptions: " I don't know" Day #1   Telephone callattemptto patientregarding EMMI general discharge red alert. Unable to reach patient. HIPAA compliant voice message left with call back phone number.  PLAN: RNCM will close patient due to being unable to reach   [name] RN,BSN,CCM [facility] Telephonic  [phone]

## 2017-12-20 ENCOUNTER — Other Ambulatory Visit: Payer: Self-pay

## 2017-12-20 ENCOUNTER — Other Ambulatory Visit (HOSPITAL_COMMUNITY): Payer: Medicare Other | Admitting: Psychiatry

## 2017-12-20 ENCOUNTER — Telehealth: Payer: Self-pay | Admitting: Adult Health

## 2017-12-20 DIAGNOSIS — R300 Dysuria: Secondary | ICD-10-CM | POA: Diagnosis not present

## 2017-12-20 DIAGNOSIS — E039 Hypothyroidism, unspecified: Secondary | ICD-10-CM | POA: Diagnosis not present

## 2017-12-20 DIAGNOSIS — R1013 Epigastric pain: Secondary | ICD-10-CM | POA: Diagnosis not present

## 2017-12-20 DIAGNOSIS — R0602 Shortness of breath: Secondary | ICD-10-CM | POA: Diagnosis not present

## 2017-12-20 NOTE — Telephone Encounter (Signed)
Gave avs and calendar ° °

## 2017-12-23 ENCOUNTER — Other Ambulatory Visit: Payer: Self-pay

## 2017-12-23 ENCOUNTER — Other Ambulatory Visit (HOSPITAL_COMMUNITY): Payer: Medicare Other

## 2017-12-23 ENCOUNTER — Other Ambulatory Visit (HOSPITAL_COMMUNITY): Payer: Medicare Other | Admitting: Psychiatry

## 2017-12-23 DIAGNOSIS — F322 Major depressive disorder, single episode, severe without psychotic features: Secondary | ICD-10-CM

## 2017-12-23 NOTE — Patient Outreach (Signed)
[facility]) Care Management  12/23/2017  ASUNA PETH 02/07/1946 [id number]  No response from patient after phone calls  and outreach reach letter attempt.  PLAN; RNCM will close patient due to being unable to reach. RNCM will send know before you close sheet/ [facility] letter.   [name] RN,BSN,CCM [facility] Telephonic  [phone]

## 2017-12-24 ENCOUNTER — Ambulatory Visit: Payer: Self-pay | Admitting: Adult Health

## 2017-12-24 ENCOUNTER — Other Ambulatory Visit (HOSPITAL_COMMUNITY): Payer: Medicare Other | Admitting: Psychiatry

## 2017-12-24 ENCOUNTER — Other Ambulatory Visit: Payer: Self-pay

## 2017-12-24 DIAGNOSIS — F322 Major depressive disorder, single episode, severe without psychotic features: Secondary | ICD-10-CM

## 2017-12-25 ENCOUNTER — Other Ambulatory Visit (HOSPITAL_COMMUNITY): Payer: Medicare Other | Admitting: Psychiatry

## 2017-12-25 ENCOUNTER — Other Ambulatory Visit: Payer: Self-pay

## 2017-12-25 ENCOUNTER — Inpatient Hospital Stay: Payer: Medicare Other | Attending: Oncology

## 2017-12-25 DIAGNOSIS — D689 Coagulation defect, unspecified: Secondary | ICD-10-CM | POA: Diagnosis not present

## 2017-12-25 DIAGNOSIS — Z7901 Long term (current) use of anticoagulants: Secondary | ICD-10-CM | POA: Diagnosis not present

## 2017-12-25 DIAGNOSIS — G4730 Sleep apnea, unspecified: Secondary | ICD-10-CM | POA: Insufficient documentation

## 2017-12-25 DIAGNOSIS — M1990 Unspecified osteoarthritis, unspecified site: Secondary | ICD-10-CM | POA: Insufficient documentation

## 2017-12-25 DIAGNOSIS — Z87891 Personal history of nicotine dependence: Secondary | ICD-10-CM | POA: Diagnosis not present

## 2017-12-25 DIAGNOSIS — E039 Hypothyroidism, unspecified: Secondary | ICD-10-CM | POA: Diagnosis not present

## 2017-12-25 DIAGNOSIS — F322 Major depressive disorder, single episode, severe without psychotic features: Secondary | ICD-10-CM

## 2017-12-25 DIAGNOSIS — Z9989 Dependence on other enabling machines and devices: Secondary | ICD-10-CM | POA: Diagnosis not present

## 2017-12-25 DIAGNOSIS — Z86711 Personal history of pulmonary embolism: Secondary | ICD-10-CM | POA: Diagnosis not present

## 2017-12-25 DIAGNOSIS — Z880 Allergy status to penicillin: Secondary | ICD-10-CM | POA: Diagnosis not present

## 2017-12-25 DIAGNOSIS — Z79899 Other long term (current) drug therapy: Secondary | ICD-10-CM | POA: Diagnosis not present

## 2017-12-25 DIAGNOSIS — E119 Type 2 diabetes mellitus without complications: Secondary | ICD-10-CM | POA: Insufficient documentation

## 2017-12-25 LAB — CBC WITH DIFFERENTIAL (CANCER CENTER ONLY)
Basophils Absolute: 0 10*3/uL (ref 0.0–0.1)
Basophils Relative: 1 %
Eosinophils Absolute: 0.1 10*3/uL (ref 0.0–0.5)
Eosinophils Relative: 3 %
HCT: 43.1 % (ref 34.8–46.6)
Hemoglobin: 14 g/dL (ref 11.6–15.9)
Lymphocytes Relative: 24 %
Lymphs Abs: 1.1 10*3/uL (ref 0.9–3.3)
MCH: 29 pg (ref 25.1–34.0)
MCHC: 32.6 g/dL (ref 31.5–36.0)
MCV: 88.9 fL (ref 79.5–101.0)
Monocytes Absolute: 0.4 10*3/uL (ref 0.1–0.9)
Monocytes Relative: 8 %
Neutro Abs: 3 10*3/uL (ref 1.5–6.5)
Neutrophils Relative %: 64 %
Platelet Count: 227 10*3/uL (ref 145–400)
RBC: 4.84 MIL/uL (ref 3.70–5.45)
RDW: 14.6 % — ABNORMAL HIGH (ref 11.2–14.5)
WBC Count: 4.7 10*3/uL (ref 3.9–10.3)

## 2017-12-25 LAB — CMP (CANCER CENTER ONLY)
ALT: 14 U/L (ref 0–55)
AST: 17 U/L (ref 5–34)
Albumin: 3.6 g/dL (ref 3.5–5.0)
Alkaline Phosphatase: 77 U/L (ref 40–150)
Anion gap: 8 (ref 3–11)
BUN: 8 mg/dL (ref 7–26)
CO2: 27 mmol/L (ref 22–29)
Calcium: 9.4 mg/dL (ref 8.4–10.4)
Chloride: 106 mmol/L (ref 98–109)
Creatinine: 0.84 mg/dL (ref 0.60–1.10)
GFR, Est AFR Am: >60 mL/min (ref >60)
GFR, Estimated: >60 mL/min (ref >60)
Glucose, Bld: 173 mg/dL — ABNORMAL HIGH (ref 70–140)
Potassium: 4.1 mmol/L (ref 3.5–5.1)
Sodium: 141 mmol/L (ref 136–145)
Total Bilirubin: 0.3 mg/dL (ref 0.2–1.2)
Total Protein: 7.2 g/dL (ref 6.4–8.3)

## 2017-12-26 ENCOUNTER — Other Ambulatory Visit (HOSPITAL_COMMUNITY): Payer: Medicare Other | Admitting: Psychiatry

## 2017-12-26 ENCOUNTER — Telehealth (HOSPITAL_COMMUNITY): Payer: Self-pay

## 2017-12-26 DIAGNOSIS — F322 Major depressive disorder, single episode, severe without psychotic features: Secondary | ICD-10-CM

## 2017-12-26 DIAGNOSIS — L570 Actinic keratosis: Secondary | ICD-10-CM | POA: Diagnosis not present

## 2017-12-26 DIAGNOSIS — L299 Pruritus, unspecified: Secondary | ICD-10-CM | POA: Diagnosis not present

## 2017-12-26 DIAGNOSIS — L821 Other seborrheic keratosis: Secondary | ICD-10-CM | POA: Diagnosis not present

## 2017-12-26 MED ORDER — FLUOXETINE HCL 10 MG PO CAPS: 10.0000 mg | ORAL_CAPSULE | Freq: Every day | ORAL | 0 refills | Status: DC

## 2017-12-26 NOTE — Progress Notes (Signed)
    Daily Group Progress Note  Program: IOP Group Time: 9:00-12:00 Participation Level: Active Behavioral Response: Sharing, Appropriate Type of Therapy:  Group Summary of Progress: The first half of the group involved the pharmacist and speaking to patients. Pharmacist spoke about medication modifications, medication management and answered any comments or concerns from patients. The second half of group focused on topics pertaining to transforming guilt into gratitude. Group members also spoke about asserting personal needs towards others. Patient mentioned feeling a low level of belonging in her life currently. Stated, "this is the closest I've been to death." Also mentioned how her life had changed a lot. Related to other group members about not feeling the need to apologize for the sake of people pleasing and feelings of worthiness. Patient also expressed guilt from how she handled certain situations from the past. Also mentioned feeling powerless and would like to find her authentic self.   Nancie Neas, LPC

## 2017-12-26 NOTE — Progress Notes (Signed)
    Daily Group Progress Note  Program: IOP  Group Time: 9:00-12:00 Participation Level: Active Behavioral Response: Sharing, Appropriate Type of Therapy:  Group Summary of Progress: The group session focused on topics pertaining to self-acceptance and counterproductive people pleasing behaviors. Group members also addressed topics associated with modeling behavior for children to follow and mental health stigmas that society may portray or misinterpret.  Patient mentioned that she had a meaningful conversation with her husband yesterday. Stated that she asked her husband if she is being a burden towards the family. Husband stated that he is okay with her. Patient also reflected on her family session last week. Had thoughts about not being easy to love due to past experiences. Also mentioned to other group members that it's helpful to "give what you want to receive." Touched on spirituality and the importance of modeling behavior for children. Nancie Neas, LPC

## 2017-12-26 NOTE — Telephone Encounter (Signed)
Medication management - Met with patient with Dr. Daron Offer to review her medications and complaints of feeling more jittery and still symptoms of depression as her husband and her are having to live apart following a recent stroke he had and placement for assistance.  Dr. Daron Offer reviewed he medical history and recommended for patient to stop Wellbutrin XL and start Prozac 10 mg, one a day, #30 with no refills and to have labs taken in a week for a comprehensive metabolic panel.  Patient agreed with plan and will let us know if any problems with medication change.  Agreed to send new Prozac order to patient's [facility] in [place] this date. Order e-scribed as Dr. Daron Offer instructed and patient agreed to stop discontinued Wellbutrin XL.

## 2017-12-27 ENCOUNTER — Other Ambulatory Visit (HOSPITAL_COMMUNITY): Payer: Medicare Other | Admitting: Psychiatry

## 2017-12-27 ENCOUNTER — Encounter: Payer: Self-pay | Admitting: Adult Health

## 2017-12-27 ENCOUNTER — Inpatient Hospital Stay (HOSPITAL_BASED_OUTPATIENT_CLINIC_OR_DEPARTMENT_OTHER): Payer: Medicare Other | Admitting: Adult Health

## 2017-12-27 VITALS — BP 101/56 | HR 65 | Temp 98.7°F | Resp 18 | Ht 66.0 in | Wt 219.2 lb

## 2017-12-27 DIAGNOSIS — D689 Coagulation defect, unspecified: Secondary | ICD-10-CM | POA: Diagnosis not present

## 2017-12-27 DIAGNOSIS — Z7901 Long term (current) use of anticoagulants: Secondary | ICD-10-CM

## 2017-12-27 DIAGNOSIS — Z86711 Personal history of pulmonary embolism: Secondary | ICD-10-CM

## 2017-12-27 DIAGNOSIS — E119 Type 2 diabetes mellitus without complications: Secondary | ICD-10-CM | POA: Diagnosis not present

## 2017-12-27 DIAGNOSIS — Z87891 Personal history of nicotine dependence: Secondary | ICD-10-CM | POA: Diagnosis not present

## 2017-12-27 DIAGNOSIS — Z79899 Other long term (current) drug therapy: Secondary | ICD-10-CM | POA: Diagnosis not present

## 2017-12-27 DIAGNOSIS — E039 Hypothyroidism, unspecified: Secondary | ICD-10-CM | POA: Diagnosis not present

## 2017-12-27 DIAGNOSIS — Z915 Personal history of self-harm: Secondary | ICD-10-CM | POA: Diagnosis not present

## 2017-12-27 DIAGNOSIS — Z9989 Dependence on other enabling machines and devices: Secondary | ICD-10-CM

## 2017-12-27 DIAGNOSIS — G4730 Sleep apnea, unspecified: Secondary | ICD-10-CM | POA: Diagnosis not present

## 2017-12-27 DIAGNOSIS — M1990 Unspecified osteoarthritis, unspecified site: Secondary | ICD-10-CM

## 2017-12-27 DIAGNOSIS — F322 Major depressive disorder, single episode, severe without psychotic features: Secondary | ICD-10-CM

## 2017-12-27 DIAGNOSIS — Z880 Allergy status to penicillin: Secondary | ICD-10-CM

## 2017-12-27 DIAGNOSIS — F332 Major depressive disorder, recurrent severe without psychotic features: Secondary | ICD-10-CM | POA: Diagnosis not present

## 2017-12-27 NOTE — Progress Notes (Signed)
    Daily Group Progress Note  Program: IOP  Group Time: 9:00-12:00  Participation Level: Active  Behavioral Response: Appropriate  Type of Therapy:  Group Therapy  Summary of Progress: Pt. Presented with brightened affect, talkative, appropriately tearful. Pt. Shared that she picked up her new medication yesterday and started taking it this morning. Pt. Continues to process the loss of her relationship with her husband. Pt. Participated in cognitive modeling exercise in order to identify her thoughts, feelings, and behaviors related to her loss of relationship that contribute to her depression.     Nancie Neas, LPC

## 2017-12-27 NOTE — Progress Notes (Signed)
    Daily Group Progress Note  Program: IOP   Group Time: 9:00-12:00  Participation Level: Active  Behavioral Response: Appropriate  Type of Therapy:  Group Therapy  Summary of Progress: Pt. Presented with depressed mood, irritable. Pt. Met with the psychiatrist and case manager. Pt. Shared with the group her frustration related to not getting her needs met by her family. Pt. Was encouraged to share honestly with her family, friends and providers her needs. Pt. Received encouragement around adjusting her expectations around what others "should now" about her needs because they care or have a relationship with her.    Nancie Neas, LPC

## 2017-12-27 NOTE — Progress Notes (Signed)
Thank you Dr. [facility]  Telephone:([phone] [phone]     ID: Hayley [name] DOB: 1946-02-17  MR#: [id number]  RCV#:[id number]  Patient Care Team: [name], MD as PCP - General (Family Medicine) Juanita Craver, MD as Consulting Physician (Gastroenterology) Daryll Brod, MD as Consulting Physician (Orthopedic Surgery) PCP: [name], MD SU: Michael Boston MD OTHER MD: Gaynelle Arabian MD  CHIEF COMPLAINT: coagulopathy  CURRENT TREATMENT: Apixaban   HISTORY OF PRESENT ILLNESS: From the 06/05/2015 consult note:  "[name] underwent laparoscopic appendectomy under Dr Johney [place] 11/27/2014 for what was likely a perforated appendix with phlegmon formation. On 12/06/2014 she was noted to be hypoxic and a chest CT/angio was obtained, showing multiple small bilateral PEs. LE dopplers that day were negative. UE dopplers the next day showed only a R cephalic v superficial thrombus.   The patient was started on warfarin, continued for one year. Repeat CT/angio 12/22/2013 showed resolution of the earlier clot.  On 12/06/2014 the patient underwent R TKR. On 12/09/2014 she was noted to be SOB and CT/angio documented bilateral PEs. She was started on Xarelto, later changed by her PCP to Pradaxa. Repeat CT angios 05/15/2014 and 06/03/2015 showed resolution of the earlier clots."  Her subsequent history is as detailed below  INTERVAL HISTORY: Hayley [name] returns today for follow-up of her coagulopathy. Her chief risk factors are of course her prior pulmonary emboli, but also sedentary lifestyle/deconditioning and morbid obesity. She continues on Apixaban and tolerates it well.  No easy bruising or bleeding.    REVIEW OF SYSTEMS: Hayley [name] is doing moderately well.  She is more active than she was 6 months ago.  She has not gone out for a walk as exercise, but is simply walking more to do things.  Hayley [name] would like to stop as many medications as possible due to tight finances in the near future  secondary to her husband and her separating.  Otherwise, she denies fevers, chills, nausea, vomiting, constipation, diarrhea, numbness, tingling, shortness of breath, cough, chest pain, palpitations, or any further concerns.  A detailed ROs was otherwise non contributory.    PAST MEDICAL HISTORY: Past Medical History:  Diagnosis Date  . Anemia   . Anxiety   . Bronchitis    hx of   . Clotting disorder ([place]) 02-17-14   S/p appendectomy-developed Pulmonary emboli-tx. warfarin-d/c 1 yr ago.  . Colon polyps   . Depression   . Diabetes mellitus without complication ([place])    "borderline"- oral med  . Disorder of vocal cords    spasmotic dysphonia ,02-17-14 has" whispery voice-low tone"  . Hyperthyroidism   . Kidney stones   . Migraines   . Nausea Nov 15, 2014   cycle of migraine headaches and nausea  . Neuromuscular disorder ([name])    hands and throat"spasmodic dysphonia", tremors - thimb and forefinger   . Obesity, Class III, BMI 40-49.9 (morbid obesity) ([place]) 04/27/2010   02-17-14 reports some weight loss- intentional  . Osteoarthritis    in back(chronic pain)  . Pelvic pain    02-17-14 "states thinks its back related on left groin"  . Pulmonary emboli [facility])    s/p Appendectomy '13 [facility]  . Reflux   . Right knee pain   . Sleep apnea    cpap - settings at 4   . Toxic goiter     PAST SURGICAL HISTORY: Past Surgical History:  Procedure Laterality Date  . ABDOMINAL HYSTERECTOMY    . APPENDECTOMY     3'13  [facility] s/p developed  Pulmonary Emboli  . BOTOX INJECTION     for migranes  . CATARACT EXTRACTION, BILATERAL Bilateral 03/2013   . CHOLECYSTECTOMY    . HAND SURGERY  2004   both hands  . KIDNEY STONE SURGERY    . KNEE ARTHROSCOPY  1998   right  . LAPAROSCOPIC APPENDECTOMY  11/27/2011   Procedure: APPENDECTOMY LAPAROSCOPIC;  Surgeon: Adin Hector, MD;  Location: [facility] ORS;  Service: General;  Laterality: N/A;  . SPINAL CORD STIMULATOR IMPLANT  06/04/2016   Dr. Alfonse Ras at [facility]  . TOTAL KNEE ARTHROPLASTY Right 12/06/2014   Procedure: RIGHT TOTAL KNEE ARTHROPLASTY;  Surgeon: Gaynelle Arabian, MD;  Location: [facility]S;  Service: Orthopedics;  Laterality: Right;  . TOTAL KNEE REVISION Left 02/24/2014   Procedure: LEFT TOTAL KNEE REVISION;  Surgeon: Gearlean Alf, MD;  Location: [facility];  Service: Orthopedics;  Laterality: Left;  . vocal cord surgery  02-17-14   01-12-14 done in [place] [facility](s/p selective denervation/reinnervation recurrent laryngeal nerve surgery)  . zenkers diverticulum      FAMILY HISTORY Family History  Problem Relation Age of Onset  . Heart disease Mother   . Pneumonia Father   . Cirrhosis Father   . Hypertension Father   . Alcoholism Father   . Heart attack Paternal Grandfather   . Heart attack Maternal Grandfather   . Heart attack Paternal Grandmother   . Heart attack Maternal Grandmother     GYNECOLOGIC HISTORY:  Patient's last menstrual period was 09/18/1979 (approximate).  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 The patient has 2 children from an earlier marriage, Hayley [name], who lives in [place], and a daughter Hayley [name], who manages a Building surveyor. The patient has one grandchild of her own, 72 with Hayley [name] (who has 3 children from a prior marriage)     HEALTH MAINTENANCE: Social History   Tobacco Use  . Smoking status: Former Smoker    Last attempt to quit: 01/03/1982    Years since quitting: 36.0  . Smokeless tobacco: Never Used  Substance Use Topics  . Alcohol use: No  . Drug use: No     Colonoscopy:  PAP:  Bone density:  Lipid panel:  Allergies  Allergen Reactions  . Bee Venom Anaphylaxis  . Tape Rash    Rash with paper tape  . Etodolac Diarrhea and Nausea And Vomiting  . Morphine Other (See Comments)    Hallucinations  . Other Itching and Swelling  . Poison Ivy Extract  [Poison Ivy Extract]  Itching and Swelling  . Propoxyphene Nausea And Vomiting  . Propoxyphene N-Acetaminophen Nausea And Vomiting  . Penicillins Rash    Has patient had a PCN reaction causing immediate rash, facial/tongue/throat swelling, SOB or lightheadedness with hypotension: Yes Has patient had a PCN reaction causing severe rash involving mucus membranes or skin necrosis: No Has patient had a PCN reaction that required hospitalization No Has patient had a PCN reaction occurring within the last 10 years: No If all of the above answers are "NO", then may proceed with Cephalosporin use.     Current Outpatient Medications  Medication Sig Dispense Refill  . albuterol (PROVENTIL HFA;VENTOLIN HFA) 108 (90 BASE) MCG/ACT inhaler Inhale 1 puff into the lungs every 6 (six) hours as needed for wheezing or shortness of breath.    [name] albuterol (PROVENTIL) (2.5 MG/3ML) 0.083%  nebulizer solution Take 2.5 mg by nebulization every 6 (six) hours as needed for wheezing or shortness of breath. Reported on 01/10/2016    . apixaban (ELIQUIS) 2.5 MG TABS tablet Take 1 tablet (2.5 mg total) by mouth 2 (two) times daily. 180 tablet 3  . benzonatate (TESSALON) 100 MG capsule Take 100 mg by mouth 2 (two) times daily as needed for cough.    [name] BIOTIN PO Take 1 capsule by mouth daily.    . Botulinum Toxin Type A (BOTOX) 200 units SOLR Inject as directed every 3 (three) months.    . cyanocobalamin (,VITAMIN B-12,) 1000 MCG/ML injection Inject 1 mL into the skin every 30 (thirty) days.  11  . diphenhydrAMINE (BENADRYL) 25 MG tablet Take 50 mg by mouth every 6 (six) hours as needed for itching.    [name] FLUoxetine (PROZAC) 10 MG capsule Take 1 capsule (10 mg total) by mouth daily. 30 capsule 0  . glipiZIDE (GLUCOTROL XL) 5 MG 24 hr tablet Take 5 mg by mouth daily with breakfast.    . levothyroxine (SYNTHROID, LEVOTHROID) 25 MCG tablet Take 25 mcg by mouth every morning.     . mometasone-formoterol (DULERA) 100-5 MCG/ACT AERO Inhale 2 puffs into  the lungs 2 (two) times daily.    . propranolol (INDERAL) 60 MG tablet Take 1 tablet (60 mg total) by mouth 2 (two) times daily. 60 tablet 0  . SUMAtriptan Succinate Refill 4 MG/0.5ML SOCT Inject 4 mg into the skin as needed. Every 3 months 12 Syringe 4  . valACYclovir (VALTREX) 1000 MG tablet Take 2,000 mg by mouth 2 (two) times daily as needed (Fever blisters). Reported on 01/10/2016  2  . VIRTUSSIN A/C 100-10 MG/5ML syrup Take 5 mLs by mouth every 6 (six) hours as needed for cough.  0  . buPROPion (WELLBUTRIN XL) 300 MG 24 hr tablet Take 1 tablet (300 mg total) by mouth daily for 15 days. 15 tablet 0  . sitaGLIPtin (JANUVIA) 100 MG tablet Take 100 mg by mouth daily.    . SUMAtriptan (IMITREX) 25 MG tablet Take 1 tablet (25 mg total) by mouth every 2 (two) hours as needed for migraine. May repeat in 2 hours if headache persists or recurs. (Patient not taking: Reported on 12/27/2017) 10 tablet 0  . traZODone (DESYREL) 50 MG tablet Take 1 tablet (50 mg total) by mouth at bedtime for 15 days. 15 tablet 0   No current facility-administered medications for this visit.     OBJECTIVE: Vitals:   12/27/17 1324  BP: (!) 101/56  Pulse: 65  Resp: 18  Temp: 98.7 F (37.1 C)  SpO2: 94%     Body mass index is 35.38 kg/m.    ECOG FS:2 - Symptomatic, <50% confined to bed GENERAL: Patient is a well appearing female in no acute distress HEENT:  Sclerae anicteric.  Oropharynx clear and moist. No ulcerations or evidence of oropharyngeal candidiasis. Neck is supple.  NODES:  No cervical, supraclavicular, or axillary lymphadenopathy palpated.  BREAST EXAM:  Deferred. LUNGS:  Clear to auscultation bilaterally.  No wheezes or rhonchi. HEART:  Regular rate and rhythm. No murmur appreciated. ABDOMEN:  Soft, nontender.  Positive, normoactive bowel sounds. No organomegaly palpated. MSK:  No focal spinal tenderness to palpation. Full range of motion bilaterally in the upper extremities. EXTREMITIES:  No peripheral  edema.   SKIN:  Clear with no obvious rashes or skin changes. No nail dyscrasia. NEURO:  Nonfocal. Well oriented.  Appropriate affect.  LAB RESULTS: CMP     Component Value Date/Time   NA 141 12/25/2017 1217   NA 138 12/12/2016 1105   K 4.1 12/25/2017 1217   K 4.6 12/12/2016 1105   CL 106 12/25/2017 1217   CO2 27 12/25/2017 1217   CO2 25 12/12/2016 1105   GLUCOSE 173 (H) 12/25/2017 1217   GLUCOSE 234 (H) 12/12/2016 1105   BUN 8 12/25/2017 1217   BUN 8.0 12/12/2016 1105   CREATININE 0.84 12/25/2017 1217   CREATININE 0.8 12/12/2016 1105   CALCIUM 9.4 12/25/2017 1217   CALCIUM 9.4 12/12/2016 1105   PROT 7.2 12/25/2017 1217   PROT 7.5 12/12/2016 1105   ALBUMIN 3.6 12/25/2017 1217   ALBUMIN 3.7 12/12/2016 1105   AST 17 12/25/2017 1217   AST 14 12/12/2016 1105   ALT 14 12/25/2017 1217   ALT 16 12/12/2016 1105   ALKPHOS 77 12/25/2017 1217   ALKPHOS 102 12/12/2016 1105   BILITOT 0.3 12/25/2017 1217   BILITOT 0.34 12/12/2016 1105   GFRNONAA >60 12/25/2017 1217   GFRAA >60 12/25/2017 1217    INo results found for: SPEP, UPEP  Lab Results  Component Value Date   WBC 4.7 12/25/2017   NEUTROABS 3.0 12/25/2017   HGB 12.6 11/20/2017   HCT 43.1 12/25/2017   MCV 88.9 12/25/2017   PLT 227 12/25/2017      Chemistry      Component Value Date/Time   NA 141 12/25/2017 1217   NA 138 12/12/2016 1105   K 4.1 12/25/2017 1217   K 4.6 12/12/2016 1105   CL 106 12/25/2017 1217   CO2 27 12/25/2017 1217   CO2 25 12/12/2016 1105   BUN 8 12/25/2017 1217   BUN 8.0 12/12/2016 1105   CREATININE 0.84 12/25/2017 1217   CREATININE 0.8 12/12/2016 1105      Component Value Date/Time   CALCIUM 9.4 12/25/2017 1217   CALCIUM 9.4 12/12/2016 1105   ALKPHOS 77 12/25/2017 1217   ALKPHOS 102 12/12/2016 1105   AST 17 12/25/2017 1217   AST 14 12/12/2016 1105   ALT 14 12/25/2017 1217   ALT 16 12/12/2016 1105   BILITOT 0.3 12/25/2017 1217   BILITOT 0.34 12/12/2016 1105       No  results found for: LABCA2  No components found for: LABCA125  No results for input(s): INR in the last 168 hours.  Urinalysis    Component Value Date/Time   COLORURINE STRAW (A) 11/20/2017 2000   APPEARANCEUR CLEAR 11/20/2017 2000   APPEARANCEUR CLOUDY 12/19/2014 0730   LABSPEC 1.006 11/20/2017 2000   LABSPEC 1.025 12/19/2014 0730   PHURINE 8.0 11/20/2017 2000   GLUCOSEU 50 (A) 11/20/2017 2000   GLUCOSEU see comment 12/19/2014 0730   HGBUR NEGATIVE 11/20/2017 [place] NEGATIVE 11/20/2017 2000   BILIRUBINUR n 05/08/2016 1506   BILIRUBINUR see comment 12/19/2014 0730   KETONESUR NEGATIVE 11/20/2017 2000   PROTEINUR NEGATIVE 11/20/2017 2000   UROBILINOGEN negative 05/08/2016 1506   UROBILINOGEN 1.0 11/30/2014 1215   NITRITE NEGATIVE 11/20/2017 2000   LEUKOCYTESUR LARGE (A) 11/20/2017 2000   LEUKOCYTESUR see comment 12/19/2014 0730   STUDIES: No results found.  ASSESSMENT: 72 y.o. [place] woman with a history of recurrent PE as follows:  (1) multiple low-volume PE noted on chest CT/angio 12/06/2014, 10 days after laparoscopic appendectomy for a perforated appendix with phlegmon (a) bilateral LE dopplers 12/06/2014 negative (b) bilateral UE dopplers 12/07/2014 showed superficial R cephalic v clot (c) treatment: warfarin x one  year, monitored through [place] (d) repeat CT angio 12/23/2015 showed prior PEs cleared; also CT/angio 05/15/2014 negative  (2) bilateral PEs documented by Ct/angio03/24/2016, day 3 post R total knee (a) started on rivaroxaban/ Xarelto, switched by PCP to dabigratan/ Pradaxa, discontinued May 2017 because of cost issues (b) repeat CT/angio 06/03/2015 shows resolution of prior PE  (c) started on apixaban 5 mg daily with symptoms suggestive of stroke, but negative workup, July 2017  (d) apixaban dose decreased to 2.5 mg daily given history of falls but  persistently elevated d-dimer  (3) hypercoagulable panel 06/05/2015 including a homocystine level, a screen for the lupus anticoagulant, beta 2 glycoprotein studies, anti-cardiolipin antibodies, and studies for the factor V Leiden and prothrombin gene mutation found no actionable abnormalities  (a) the anticardiolipin IgM antibody 06/05/2015 was 34 ("low medium positive"), repeat 07/18/2015 was 38  PLAN:  Shakti is doing well today. She would like to stop the Apixaban.  It is too expensive.  She has one month supply left. At this point, she is doing better from a mobility standpoint.  She however, needs to make continued improvements.  I reviewed this with her.  I am however concerned with her inability to afford the medication.  I have asked Evelena Peat in oral pharmacy to help find a better solution for Jaylenne and let myself and Dr. [name] review the solution.  Nusrat is in agreement with this.  I let her know that Denyse Amass would reach out to her.    We will go ahead and scheduled Kayleah to return in one year for f/u with Dr. [name].  I informed her that if she has to change anti coagulants, her f/u may need to change.  She knows to call for any problems that may develop before her next visit.  A total of (30) minutes of face-to-face time was spent with this patient with greater than 50% of that time in counseling and care-coordination.     Scot Dock, NP   12/27/2017 1:46 PM

## 2017-12-30 ENCOUNTER — Other Ambulatory Visit (HOSPITAL_COMMUNITY): Payer: Medicare Other | Admitting: Psychiatry

## 2017-12-30 ENCOUNTER — Telehealth: Payer: Self-pay | Admitting: Adult Health

## 2017-12-30 DIAGNOSIS — F322 Major depressive disorder, single episode, severe without psychotic features: Secondary | ICD-10-CM

## 2017-12-30 NOTE — Telephone Encounter (Signed)
Per 4/12 no los °

## 2017-12-30 NOTE — Progress Notes (Signed)
    Daily Group Progress Note  Program: IOP  Group Time: 9:00-12:00 Participation Level: Active Behavioral Response: Sharing, Appropriate Type of Therapy:  Group Summary of Progress: The first half of the group focused on topics pertaining to gaining more awareness for the causes of worrying thoughts. Patients also discussed the availability of support system and lack thereof. The second half of the group focused on past, current and pre-anticipated grief pertaining to a loss or negative changes in well-being. Patient expressed being overwhelmed with tending to her house by herself. Received positive feedback from other members. Also spoke about the importance of surrendering what unfortunate event is in the present moment and suggested asserting personal needs. When the grief topic was brought up, patient became silent and withdrawn from the group. When gently confronted, patient stated, "I don't want to go there today."  Nancie Neas, LPC

## 2017-12-31 ENCOUNTER — Other Ambulatory Visit (HOSPITAL_COMMUNITY): Payer: Medicare Other | Admitting: Psychiatry

## 2017-12-31 DIAGNOSIS — F322 Major depressive disorder, single episode, severe without psychotic features: Secondary | ICD-10-CM | POA: Diagnosis not present

## 2017-12-31 DIAGNOSIS — F900 Attention-deficit hyperactivity disorder, predominantly inattentive type: Secondary | ICD-10-CM | POA: Diagnosis not present

## 2018-01-01 ENCOUNTER — Other Ambulatory Visit (HOSPITAL_COMMUNITY): Payer: Medicare Other | Admitting: Psychiatry

## 2018-01-01 DIAGNOSIS — F322 Major depressive disorder, single episode, severe without psychotic features: Secondary | ICD-10-CM

## 2018-01-02 ENCOUNTER — Other Ambulatory Visit (HOSPITAL_COMMUNITY): Payer: Medicare Other | Admitting: Psychiatry

## 2018-01-02 DIAGNOSIS — F322 Major depressive disorder, single episode, severe without psychotic features: Secondary | ICD-10-CM

## 2018-01-03 ENCOUNTER — Encounter (HOSPITAL_COMMUNITY): Payer: Self-pay | Admitting: Family

## 2018-01-03 ENCOUNTER — Other Ambulatory Visit (HOSPITAL_COMMUNITY): Payer: Medicare Other | Admitting: Psychiatry

## 2018-01-03 DIAGNOSIS — F322 Major depressive disorder, single episode, severe without psychotic features: Secondary | ICD-10-CM

## 2018-01-03 NOTE — Progress Notes (Signed)
Hayley [name] is a 72 y.o. married, Caucasian female who was a referral from ED seeking intensive services. Pt reported that life came to a head on 11/14/17 and she made a plan to kill herself. Pt stated she had "never even thought about it for one second" prior to that day, however created a well thoughtout plan and carried it out within a few days. Pt attempted by ODing on her prescription Oxycodone and was found 1-2 days later by her husband who took her to the hospital. Pt is not very clear on what stressors came to a point, however shares that her husband of 70 yrs marriage is frail aging (Dec. 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 According to pt, he recently had a severe fall within their home.  ED notes show pt was in the ED/hospital for 15 days due to her attempt and medical complications. Pt was originally recommended psychiatric inpatient however while waiting for a bed was demmed appropriate for outpatient treatment. Pt stated while in the hospital her husband was moved out of their house to live with his oldest daughter, her children have become involved and want to take over her decision making, and that "there is no trust in me anywhere." Pt stated the children were upset that she and her husband did not share with them how bad things were. Pt has a strong faith system and reports since her attempt she has a renweed faith in God and reports she "will definitely not ever" attempt again as she is sure God has a plan for her and a reason for why she is still here. Pt is talkative and tangential. It is difficult to get clear answers and timeline. Pt continues to deny SI/HI currently.  Also, denies A/V hallucinations or drug/ETOH issues. Pt will be discharge today.  Pt is anxious about discharging today.  States she is ready to move on to  a therapist to continue working on applying coping skills.  Recommended [facility] or Aftercare evening Group at [facility] and [facility]; but pt declined. A:  D/C today.  F/U with Amada Jupiter, LCSW on 01-07-18 @ 9 a.m and Dr. Adele Schilder on 01-21-18 @ 11 a.m.  R:  Pt receptive.          Carlis Abbott, RITA, M.Ed,CNA

## 2018-01-03 NOTE — Patient Instructions (Signed)
D:  Pt completed MH-IOP today.  A:  Discharge today.  Follow up with Amada Jupiter, LCSW on 01-07-18 @ 9 a.m and Dr. Adele Schilder on 01-21-18 @ 11 a.m. R:  Pt receptive.

## 2018-01-03 NOTE — Progress Notes (Signed)
  [facility] Discharge Summary  Hayley [name] [id number]  Admission date: 12/10/2017 Discharge date: 01/03/2018  Reason for admission: Per admission assessment note- Hayley [name] 72 year old female presents after a recent hospitalization for suicide attempt. States she attempted to overdose on oxycodone and was found a few days later. States she became overwhelmed and stressed due to her husbands declining health. Reports her husbands children has threaten to remove her husband to a nursing home or to his daughters house. Reports their current  living situation has become unsafe for her husband to reside in. (safety/fall hazard) States she has always been able to manage her household day to day business as she felt powerless with decision benign made without her input.  Reports previous diginose of depression and Anxiety and has on seen her Primary Care Provider. States she is prescribed  Wellbutrin 300 mg and  Gabapentin 300 mg. Report she is tolerating medication well without side effect.  Patient denies previous inpatient admissions. Hayley [name] present flat and depressed however pleasant. Currently denying suicidal ideations, racing thought or hallucinations. Patient appears future and goal oriented as she reports "picking up the pieces" reports her children have been supportive and her faith in GOD will not allow her to give up. states Support, encouragement and reassurance was provided   Chemical Use History: Was Denied   Family of Origin Issues:  Hayley [name] continues to express concerns and frustration with her in laws. Reports her family home going into foreclosure and she has no support by her husband's children. Hayley [name] continues to  ruminate with "trust issues" and her codependency to others. Reports mild depression and anxiety. Supports, encouragement and reassurances was provide.   Progress in Program Toward Treatment Goals: Hayley [name] attended and participated with  group session. While in the program patient was seen by MD Daron Offer who made medication adjustments. Wellbutrin XL was discontinued and patient was initiated on Prozac10 mg. Patient to continue with medication adjustments a prescribed.   Progress (rationale): Ongoing, Dian to keep follow-up with Dr Marchia Bond 01/21/18 and Almyra Free therapist on 4/23 Hayley [name] was provided with additional resources for local therapist. Of note: labs collected  on 01/02/2017 were completed for genetic testing- results pending    Take all medications as prescribed. Keep all follow-up appointments as scheduled.  Do not consume alcohol or use illegal drugs while on prescription medications. Report any adverse effects from your medications to your primary care provider promptly.  In the event of recurrent symptoms or worsening symptoms, call 911, a crisis hotline, or go to the nearest emergency department for evaluation.    Derrill Center, NP 01/03/2018

## 2018-01-06 ENCOUNTER — Other Ambulatory Visit (HOSPITAL_COMMUNITY): Payer: Medicare Other

## 2018-01-06 ENCOUNTER — Telehealth: Payer: Self-pay | Admitting: *Deleted

## 2018-01-06 NOTE — Telephone Encounter (Signed)
This RN received VM from pt stating she was seen by Mendel Ryder NP a little over a week ago - and at that visit " Mendel Ryder said she would have the oral pharmacist call me about getting some help with my medication "  " I still haven't heard anything but my home phone has been disconnected so the only number I have is my cell number of [phone]. "  This note will be forwarded to LC/NP and Emeline Darling.

## 2018-01-07 ENCOUNTER — Other Ambulatory Visit (HOSPITAL_COMMUNITY): Payer: Medicare Other

## 2018-01-07 NOTE — Progress Notes (Signed)
    Daily Group Progress Note  Program: IOP Group Time: 9am-12noon  Participation Level:  active  Behavioral Response:  responsive  Type of Therapy:   group therapy  Summary of Progress:  Pt has an hx of Depression with a recent suicide attempt. Today, client was initially tearful. She shares with group members that she was told her home could be facing foreclosure. Client reports feeling, "Loss, anxious, and in a dark hole". Peers offered support and advice to work out her foreclosure issues. Client was receptive to peers and agreed to follow up with advice provided. Client thanked peers for their support and advice.  Patient reports feelings of increased anxiety this morning. She continues to have unsettling thoughts about finding out about her home becoming a possible foreclosure case\. Patient's anxiety has increased as she deals with finding a way to cope. She has been using the strategies provided in group (breathing exercises, utilizing social supports, etc.). Counselor reminded patient that not all anxiety is "bad anxiety".  Patient agreed and found a positive side to her anxiety stating that it "produces motivation".  This resonated with the patient. She shared with the group that she recently started thinking about praying more and looking toward her higher power. She also mentioned that she is looking forward and anticipating the Easter Holiday. She feels a burden to her daughter and son in law. However, would like to ask them if they are interested in celebrating the upcoming Holiday. Counselor encouraged client to do so, as well as her group peers. Pt is dressed in street clothes, alert, oriented x4 with normal speech and normal motor behavior. Eye contact is fair. Pt's mood is depressed and affect is congruent with mood. Thought process is coherent and relevant. Pt's insight is fair and judgement is fair. There is no indication Pt is currently not responding to internal stimuli or  experiencing delusional thought content. Pt was cooperative throughout assessment.  Nancie Neas, LPC

## 2018-01-07 NOTE — Progress Notes (Signed)
    Daily Group Progress Note  Program: IOP  Summary of Progress: The theme for the first part of group today was, "Self-acceptance, self-blame, loving yourself, forgiveness and letting go of the past in order to move forward." The therapist facilitated the session addressing each patient's issues about family disappointments, and resentments.  as she offered coping strategies to help group members understand the importance of self-acceptance, loving yourself and letting go of things that you have no control over.  During the second half of group, the therapist conducted a psychoeducational activity on mindfulness.  She used [employer] to demonstrate the experience of being conscious in the present moment, the awareness of the smell of the unwrapped chocolate with the anticipation of biting into it.  Showing all the mental, physical and emotional focus on being in the present moment.  The group enjoyed the therapeutic experience. The patient shared with group that she was feeling good.  She has been enjoying group.  He shared a lot of insights with group members.  The patient was very active in group and her behavioral response was appropriate. The patient was discharged today. Nancie Neas, LPC

## 2018-01-08 ENCOUNTER — Other Ambulatory Visit (HOSPITAL_COMMUNITY): Payer: Medicare Other

## 2018-01-08 DIAGNOSIS — F332 Major depressive disorder, recurrent severe without psychotic features: Secondary | ICD-10-CM | POA: Diagnosis not present

## 2018-01-08 NOTE — Progress Notes (Signed)
    Daily Group Progress Note  Program: IOP  Type of Therapy:  Group Therapy  Group Time: 9:00-12:00   Participation Level:  Active   Participation Quality:  Appropriate   Affect:  Appropriate   Cognitive:  Appropriate   Insight:  Appropriate   Engagement in Group:  Engaged   Modes of Intervention:  Problem-solving  Hayley [name] is a 72 year old, married, Caucasian female.   She reported mood and anxiety as her diagnoses. She further reported that she is having challenges with recent stressors. She reports that life became over bearing for her, especially on 11/14/2017 when she attempted suicide.  She is generally introverted and has had difficulty finding time to recharge. She reported that she is now finding purpose in her life and hopes to help others through her spiritual understanding. Counselor utilized therapeutic skills and motivational interviewing skills and group processing as a means for therapeutic intervention.  The second hour of today's session focused on the subject, "The Significance of Gratitude-Thankful People are Happy People". Counselor educated clients on what can be considered as your benefits in life, you tend to be happier, healthier, and you experience a deeper sense of fulfillment. The habit of gratitude has a number of life-affirming, positive effects. Counselor further processed with group those affects and promoted group processing. [name], mentioned gratitude improves her way of thinking where she acknowledges what she has and doesn't take things for granted. She shared with the group that she is exercising more and eating healthier. Client states, "I want to create a healthier lifestyle for myself. Third hour counselor asked client to complete an activity listing her "Gratitude's" and "Wants". Client processed with group her list and shared that creating this list will help her to be more grateful in challenging situations. Counselor and group affirmed client  for sharing her list and client received feedback from peers.  Pt is dressed in street clothes, alert, oriented x4 with normal speech and normal motor behavior. Eye contact is fair. Pt's mood is depressed and affect is congruent with mood. Thought process is coherent and relevant. Pt's insight is fair and judgement is fair. There is no indication Pt is currently responding to internal stimuli or experiencing delusional thought content. Pt was cooperative throughout assessment.  Nancie Neas, LPC

## 2018-01-08 NOTE — Progress Notes (Signed)
    Daily Group Progress Note  Program: IOP  Group Time: 9:00-12:00 Participation Level: Active Behavioral Response: Sharing, Appropriate Type of Therapy:  Group Summary of Progress: The group focused on topics pertaining to accepting individual flaws. Group members also spoke about how past events may have negatively affected them along with what makes them feel safe in order to share these experiences. The second half of group dealt with topics pertaining to grief. Patient (Scale 7) expressed emotion and shed tears as she spoke about how she misses her husband. Patient was asked about what makes her feel safe to express herself in group-to which we responded "feeling unjudged and tended to". Also mentions making the plan to potentially go to the beach for Easter.   Nancie Neas, LPC

## 2018-01-08 NOTE — Progress Notes (Signed)
    Daily Group Progress Note  Program: IOP  Group Time: 9:00-12:00  Participation Level: Active  Behavioral Response: Appropriate  Type of Therapy:  Group Therapy  Summary of Progress: The theme in group today was, "Communicating what you want / need, trust, and accountability." The therapist facilitated the session addressing each patient's issues as she offered coping strategies to help group members improve on asserting themselves without feeling guilty or uncomfortable and trusting themselves. The patient shared with group that she felt good that she's alive.  She had a good weekend. Her daughter visited her and brought her dinner which she totally enjoyed.  She was able to communicate to her husband that she wants to see Shirlee Limerick, her dog.  She will get to see Shirlee Limerick soon.  Her husband was also asking about her experience in group and she felt good about that. The patient was active in group.   She was very supportive of group members and shared a great deal of insights and wisdom with group. Her behavioral response was appropriate.       Nancie Neas, LPC

## 2018-01-09 ENCOUNTER — Other Ambulatory Visit (HOSPITAL_COMMUNITY): Payer: Medicare Other

## 2018-01-10 ENCOUNTER — Other Ambulatory Visit (HOSPITAL_COMMUNITY): Payer: Medicare Other

## 2018-01-13 ENCOUNTER — Other Ambulatory Visit (HOSPITAL_COMMUNITY): Payer: Medicare Other

## 2018-01-13 ENCOUNTER — Telehealth: Payer: Self-pay | Admitting: Pharmacist

## 2018-01-13 NOTE — Telephone Encounter (Signed)
Oral Chemotherapy Pharmacist Encounter  Dispensed samples to patient:  Medication: Eliquis 5mg  tablets Instructions: Take 1/2 tablet (2.5mg ) by mouth 2 times daily Quantity dispensed: 28 Days supply: 28 Manufacturer: BMS Lot: [id number]  Exp: 11/2019  Patient coming into [facility] today for assistance with manufacturer assistance application. Patient will be provided 4 weeks of samples as she has 5 days of pills remaining.  Johny Drilling, PharmD, BCPS, BCOP 01/13/2018 12:35 PM [facility] [phone]

## 2018-01-14 ENCOUNTER — Other Ambulatory Visit (HOSPITAL_COMMUNITY): Payer: Medicare Other

## 2018-01-15 ENCOUNTER — Other Ambulatory Visit (HOSPITAL_COMMUNITY): Payer: Medicare Other

## 2018-01-16 ENCOUNTER — Other Ambulatory Visit (HOSPITAL_COMMUNITY): Payer: Medicare Other

## 2018-01-17 ENCOUNTER — Other Ambulatory Visit (HOSPITAL_COMMUNITY): Payer: Medicare Other

## 2018-01-20 ENCOUNTER — Other Ambulatory Visit (HOSPITAL_COMMUNITY): Payer: Medicare Other

## 2018-01-21 ENCOUNTER — Ambulatory Visit (INDEPENDENT_AMBULATORY_CARE_PROVIDER_SITE_OTHER): Payer: Medicare Other | Admitting: Psychiatry

## 2018-01-21 ENCOUNTER — Encounter (HOSPITAL_COMMUNITY): Payer: Self-pay | Admitting: Psychiatry

## 2018-01-21 ENCOUNTER — Other Ambulatory Visit (HOSPITAL_COMMUNITY): Payer: Medicare Other

## 2018-01-21 VITALS — BP 126/80 | HR 66 | Ht 66.0 in | Wt 216.0 lb

## 2018-01-21 DIAGNOSIS — T1491XA Suicide attempt, initial encounter: Secondary | ICD-10-CM | POA: Diagnosis not present

## 2018-01-21 DIAGNOSIS — Z638 Other specified problems related to primary support group: Secondary | ICD-10-CM | POA: Diagnosis not present

## 2018-01-21 DIAGNOSIS — Z811 Family history of alcohol abuse and dependence: Secondary | ICD-10-CM | POA: Diagnosis not present

## 2018-01-21 DIAGNOSIS — M2550 Pain in unspecified joint: Secondary | ICD-10-CM

## 2018-01-21 DIAGNOSIS — F322 Major depressive disorder, single episode, severe without psychotic features: Secondary | ICD-10-CM | POA: Diagnosis not present

## 2018-01-21 DIAGNOSIS — T391X2A Poisoning by 4-Aminophenol derivatives, intentional self-harm, initial encounter: Secondary | ICD-10-CM

## 2018-01-21 DIAGNOSIS — M549 Dorsalgia, unspecified: Secondary | ICD-10-CM | POA: Diagnosis not present

## 2018-01-21 DIAGNOSIS — R450 Nervousness: Secondary | ICD-10-CM | POA: Diagnosis not present

## 2018-01-21 DIAGNOSIS — F419 Anxiety disorder, unspecified: Secondary | ICD-10-CM

## 2018-01-21 DIAGNOSIS — Z87891 Personal history of nicotine dependence: Secondary | ICD-10-CM

## 2018-01-21 DIAGNOSIS — Z598 Other problems related to housing and economic circumstances: Secondary | ICD-10-CM

## 2018-01-21 MED ORDER — TRAZODONE HCL 50 MG PO TABS: 50.0000 mg | ORAL_TABLET | Freq: Every day | ORAL | 0 refills | Status: DC

## 2018-01-21 MED ORDER — FLUOXETINE HCL 20 MG PO CAPS: 20.0000 mg | ORAL_CAPSULE | Freq: Every day | ORAL | 0 refills | Status: DC

## 2018-01-21 NOTE — Progress Notes (Signed)
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 [address]  Glucose, capillary     Status: Abnormal   Collection Time: 11/20/17 10:08 PM  Result Value Ref Range   Glucose-Capillary 157 (H) 65 - 99 mg/dL  Glucose, capillary     Status: Abnormal   Collection Time: 11/20/17 11:38 PM  Result Value Ref Range   Glucose-Capillary 133 (H) 65 - 99 mg/dL  Glucose, capillary      Status: Abnormal   Collection Time: 11/21/17  4:10 AM  Result Value Ref Range   Glucose-Capillary 120 (H) 65 - 99 mg/dL  Glucose, capillary     Status: Abnormal   Collection Time: 11/21/17  7:54 AM  Result Value Ref Range   Glucose-Capillary 144 (H) 65 - 99 mg/dL  Glucose, capillary     Status: Abnormal   Collection Time: 11/21/17 11:53 AM  Result Value Ref Range   Glucose-Capillary 120 (H) 65 - 99 mg/dL  Glucose, capillary     Status: Abnormal   Collection Time: 11/21/17  4:19 PM  Result Value Ref Range   Glucose-Capillary 259 (H) 65 - 99 mg/dL  Glucose, capillary     Status: Abnormal   Collection Time: 11/21/17  7:58 PM  Result Value Ref Range   Glucose-Capillary 205 (H) 65 - 99 mg/dL  Glucose, capillary     Status: Abnormal   Collection Time: 11/22/17 12:01 AM  Result Value Ref Range   Glucose-Capillary 212 (H) 65 - 99 mg/dL  Glucose, capillary     Status: Abnormal   Collection Time: 11/22/17  4:09 AM  Result Value Ref Range   Glucose-Capillary 165 (H) 65 - 99 mg/dL  Glucose, capillary     Status: Abnormal   Collection Time: 11/22/17  7:26 AM  Result Value Ref Range   Glucose-Capillary 206 (H) 65 - 99 mg/dL  Glucose, capillary     Status: Abnormal   Collection Time: 11/22/17 11:40 AM  Result Value Ref Range   Glucose-Capillary 204 (H) 65 - 99 mg/dL  Glucose, capillary     Status: Abnormal   Collection Time: 11/22/17  4:20 PM  Result Value Ref Range   Glucose-Capillary 269 (H) 65 - 99 mg/dL  Glucose, capillary     Status: Abnormal   Collection Time: 11/22/17  8:02 PM  Result Value Ref Range   Glucose-Capillary 131 (H) 65 - 99 mg/dL  Glucose, capillary     Status: Abnormal   Collection Time: 11/23/17 12:28 AM  Result Value Ref Range  Glucose-Capillary 324 (H) 65 - 99 mg/dL  Glucose, capillary     Status: Abnormal   Collection Time: 11/23/17  4:00 AM  Result Value Ref Range   Glucose-Capillary 141 (H) 65 - 99 mg/dL  Glucose, capillary     Status: Abnormal    Collection Time: 11/23/17  7:23 AM  Result Value Ref Range   Glucose-Capillary 118 (H) 65 - 99 mg/dL  Glucose, capillary     Status: Abnormal   Collection Time: 11/23/17 12:00 PM  Result Value Ref Range   Glucose-Capillary 176 (H) 65 - 99 mg/dL  Glucose, capillary     Status: Abnormal   Collection Time: 11/23/17  4:31 PM  Result Value Ref Range   Glucose-Capillary 137 (H) 65 - 99 mg/dL  Glucose, capillary     Status: Abnormal   Collection Time: 11/23/17  7:52 PM  Result Value Ref Range   Glucose-Capillary 241 (H) 65 - 99 mg/dL  Glucose, capillary     Status: Abnormal   Collection Time: 11/23/17 10:14 PM  Result Value Ref Range   Glucose-Capillary 264 (H) 65 - 99 mg/dL  Glucose, capillary     Status: Abnormal   Collection Time: 11/24/17  7:26 AM  Result Value Ref Range   Glucose-Capillary 186 (H) 65 - 99 mg/dL  Glucose, capillary     Status: Abnormal   Collection Time: 11/24/17 12:15 PM  Result Value Ref Range   Glucose-Capillary 241 (H) 65 - 99 mg/dL  Glucose, capillary     Status: Abnormal   Collection Time: 11/24/17  5:00 PM  Result Value Ref Range   Glucose-Capillary 158 (H) 65 - 99 mg/dL   Comment 1 Notify RN   Glucose, capillary     Status: Abnormal   Collection Time: 11/24/17  9:06 PM  Result Value Ref Range   Glucose-Capillary 127 (H) 65 - 99 mg/dL  Glucose, capillary     Status: Abnormal   Collection Time: 11/25/17  7:39 AM  Result Value Ref Range   Glucose-Capillary 182 (H) 65 - 99 mg/dL  Glucose, capillary     Status: Abnormal   Collection Time: 11/25/17 11:58 AM  Result Value Ref Range   Glucose-Capillary 177 (H) 65 - 99 mg/dL  Glucose, capillary     Status: Abnormal   Collection Time: 11/25/17  4:26 PM  Result Value Ref Range   Glucose-Capillary 158 (H) 65 - 99 mg/dL  Glucose, capillary     Status: Abnormal   Collection Time: 11/25/17  9:10 PM  Result Value Ref Range   Glucose-Capillary 235 (H) 65 - 99 mg/dL  Glucose, capillary     Status: Abnormal    Collection Time: 11/26/17  7:34 AM  Result Value Ref Range   Glucose-Capillary 192 (H) 65 - 99 mg/dL  Glucose, capillary     Status: Abnormal   Collection Time: 11/26/17 11:53 AM  Result Value Ref Range   Glucose-Capillary 184 (H) 65 - 99 mg/dL  Glucose, capillary     Status: Abnormal   Collection Time: 11/26/17  4:19 PM  Result Value Ref Range   Glucose-Capillary 198 (H) 65 - 99 mg/dL  Glucose, capillary     Status: Abnormal   Collection Time: 11/26/17  7:50 PM  Result Value Ref Range   Glucose-Capillary 201 (H) 65 - 99 mg/dL  Glucose, capillary     Status: Abnormal   Collection Time: 11/27/17 12:01 AM  Result Value Ref Range   Glucose-Capillary 150 (H) 65 - 99 mg/dL  Glucose, capillary     Status: Abnormal   Collection Time: 11/27/17  7:58 AM  Result Value Ref Range   Glucose-Capillary 155 (H) 65 - 99 mg/dL  Glucose, capillary     Status: Abnormal   Collection Time: 11/27/17 12:06 PM  Result Value Ref Range   Glucose-Capillary 159 (H) 65 - 99 mg/dL  Glucose, capillary     Status: Abnormal   Collection Time: 11/27/17  5:30 PM  Result Value Ref Range   Glucose-Capillary 157 (H) 65 - 99 mg/dL  Glucose, capillary     Status: Abnormal   Collection Time: 11/27/17  9:45 PM  Result Value Ref Range   Glucose-Capillary 152 (H) 65 - 99 mg/dL   Comment 1 Notify RN   Glucose, capillary     Status: Abnormal   Collection Time: 11/28/17  9:39 AM  Result Value Ref Range   Glucose-Capillary 142 (H) 65 - 99 mg/dL  Glucose, capillary     Status: Abnormal   Collection Time: 11/28/17 12:26 PM  Result Value Ref Range   Glucose-Capillary 172 (H) 65 - 99 mg/dL  Glucose, capillary     Status: Abnormal   Collection Time: 11/28/17  6:14 PM  Result Value Ref Range   Glucose-Capillary 171 (H) 65 - 99 mg/dL  Glucose, capillary     Status: Abnormal   Collection Time: 11/28/17  9:51 PM  Result Value Ref Range   Glucose-Capillary 122 (H) 65 - 99 mg/dL  Glucose, capillary     Status: Abnormal    Collection Time: 11/29/17  7:25 AM  Result Value Ref Range   Glucose-Capillary 139 (H) 65 - 99 mg/dL  Glucose, capillary     Status: Abnormal   Collection Time: 11/29/17 12:23 PM  Result Value Ref Range   Glucose-Capillary 140 (H) 65 - 99 mg/dL  Glucose, capillary     Status: Abnormal   Collection Time: 11/29/17  5:14 PM  Result Value Ref Range   Glucose-Capillary 145 (H) 65 - 99 mg/dL  Glucose, capillary     Status: Abnormal   Collection Time: 11/29/17  9:23 PM  Result Value Ref Range   Glucose-Capillary 168 (H) 65 - 99 mg/dL  Glucose, capillary     Status: Abnormal   Collection Time: 11/30/17  7:21 AM  Result Value Ref Range   Glucose-Capillary 169 (H) 65 - 99 mg/dL   Comment 1 Notify RN    Comment 2 Document in Chart   Glucose, capillary     Status: Abnormal   Collection Time: 11/30/17 11:27 AM  Result Value Ref Range   Glucose-Capillary 147 (H) 65 - 99 mg/dL  Glucose, capillary     Status: Abnormal   Collection Time: 11/30/17  5:20 PM  Result Value Ref Range   Glucose-Capillary 163 (H) 65 - 99 mg/dL   Comment 1 Notify RN    Comment 2 Document in Chart   Glucose, capillary     Status: Abnormal   Collection Time: 11/30/17 10:09 PM  Result Value Ref Range   Glucose-Capillary 154 (H) 65 - 99 mg/dL  Glucose, capillary     Status: Abnormal   Collection Time: 12/01/17  8:00 AM  Result Value Ref Range   Glucose-Capillary 133 (H) 65 - 99 mg/dL  Glucose, capillary     Status: Abnormal   Collection Time: 12/01/17 12:28 PM  Result Value Ref Range   Glucose-Capillary 251 (H) 65 - 99 mg/dL   Comment 1 Notify RN    Comment 2  Document in Chart   Glucose, capillary     Status: Abnormal   Collection Time: 12/01/17  4:33 PM  Result Value Ref Range   Glucose-Capillary 188 (H) 65 - 99 mg/dL   Comment 1 Notify RN   Glucose, capillary     Status: Abnormal   Collection Time: 12/01/17  9:15 PM  Result Value Ref Range   Glucose-Capillary 204 (H) 65 - 99 mg/dL  Glucose, capillary      Status: Abnormal   Collection Time: 12/02/17  8:08 AM  Result Value Ref Range   Glucose-Capillary 150 (H) 65 - 99 mg/dL  Glucose, capillary     Status: Abnormal   Collection Time: 12/02/17 11:57 AM  Result Value Ref Range   Glucose-Capillary 202 (H) 65 - 99 mg/dL  CBC with Differential (Cancer Center Only)     Status: Abnormal   Collection Time: 12/25/17 12:17 PM  Result Value Ref Range   WBC Count 4.7 3.9 - 10.3 K/uL   RBC 4.84 3.70 - 5.45 MIL/uL   Hemoglobin 14.0 11.6 - 15.9 g/dL   HCT 43.1 34.8 - 46.6 %   MCV 88.9 79.5 - 101.0 fL   MCH 29.0 25.1 - 34.0 pg   MCHC 32.6 31.5 - 36.0 g/dL   RDW 14.6 (H) 11.2 - 14.5 %   Platelet Count 227 145 - 400 K/uL   Neutrophils Relative % 64 %   Neutro Abs 3.0 1.5 - 6.5 K/uL   Lymphocytes Relative 24 %   Lymphs Abs 1.1 0.9 - 3.3 K/uL   Monocytes Relative 8 %   Monocytes Absolute 0.4 0.1 - 0.9 K/uL   Eosinophils Relative 3 %   Eosinophils Absolute 0.1 0.0 - 0.5 K/uL   Basophils Relative 1 %   Basophils Absolute 0.0 0.0 - 0.1 K/uL    Comment: Performed at [facility], [place] [address] [place], [place] [address]  CMP ([place] only)     Status: Abnormal   Collection Time: 12/25/17 12:17 PM  Result Value Ref Range   Sodium 141 136 - 145 mmol/L   Potassium 4.1 3.5 - 5.1 mmol/L   Chloride 106 98 - 109 mmol/L   CO2 27 22 - 29 mmol/L   Glucose, Bld 173 (H) 70 - 140 mg/dL   BUN 8 7 - 26 mg/dL   Creatinine 0.84 0.60 - 1.10 mg/dL   Calcium 9.4 8.4 - 10.4 mg/dL   Total Protein 7.2 6.4 - 8.3 g/dL   Albumin 3.6 3.5 - 5.0 g/dL   AST 17 5 - 34 U/L   ALT 14 0 - 55 U/L   Alkaline Phosphatase 77 40 - 150 U/L   Total Bilirubin 0.3 0.2 - 1.2 mg/dL   GFR, Est Non Af Am >60 >60 mL/min   GFR, Est AFR Am >60 >60 mL/min    Comment: (NOTE) The eGFR has been calculated using the CKD EPI equation. This calculation has not been validated in all clinical situations. eGFR's persistently <60 mL/min signify possible Chronic  Kidney Disease.    Anion gap 8 3 - 11    Comment: Performed at [facility], [address], [place], [place] [address]   Lab Results  Component Value Date   HGBA1C 9.4 (H) 12/19/2016   MPG 157 03/23/2016   MPG 154 06/04/2015   No results found for: PROLACTIN Lab Results  Component Value Date   CHOL 159 03/23/2016   TRIG 122 11/17/2017   HDL  31 (L) 03/23/2016   CHOLHDL 5.1 03/23/2016   VLDL 35 03/23/2016   LDLCALC 93 03/23/2016     Current Medications: Current Outpatient Medications  Medication Sig Dispense Refill  . albuterol (PROVENTIL HFA;VENTOLIN HFA) 108 (90 BASE) MCG/ACT inhaler Inhale 1 puff into the lungs every 6 (six) hours as needed for wheezing or shortness of breath.    [name] albuterol (PROVENTIL) (2.5 MG/3ML) 0.083% nebulizer solution Take 2.5 mg by nebulization every 6 (six) hours as needed for wheezing or shortness of breath. Reported on 01/10/2016    . apixaban (ELIQUIS) 2.5 MG TABS tablet Take 1 tablet (2.5 mg total) by mouth 2 (two) times daily. 180 tablet 3  . benzonatate (TESSALON) 100 MG capsule Take 100 mg by mouth 2 (two) times daily as needed for cough.    . diphenhydrAMINE (BENADRYL) 25 MG tablet Take 50 mg by mouth every 6 (six) hours as needed for itching.    [name] FLUoxetine (PROZAC) 20 MG capsule Take 1 capsule (20 mg total) by mouth daily. 30 capsule 0  . glipiZIDE (GLUCOTROL XL) 5 MG 24 hr tablet Take 5 mg by mouth daily with breakfast.    . levothyroxine (SYNTHROID, LEVOTHROID) 25 MCG tablet Take 25 mcg by mouth every morning.     . mometasone-formoterol (DULERA) 100-5 MCG/ACT AERO Inhale 2 puffs into the lungs 2 (two) times daily.    . propranolol (INDERAL) 60 MG tablet Take 1 tablet (60 mg total) by mouth 2 (two) times daily. 60 tablet 0  . sitaGLIPtin (JANUVIA) 100 MG tablet Take 100 mg by mouth daily.    . SUMAtriptan (IMITREX) 25 MG tablet Take 1 tablet (25 mg total) by mouth every 2 (two) hours as needed for migraine. May  repeat in 2 hours if headache persists or recurs. 10 tablet 0  . SUMAtriptan Succinate Refill 4 MG/0.5ML SOCT Inject 4 mg into the skin as needed. Every 3 months 12 Syringe 4  . traZODone (DESYREL) 50 MG tablet Take 1 tablet (50 mg total) by mouth at bedtime for 15 days. 15 tablet 0  . valACYclovir (VALTREX) 1000 MG tablet Take 2,000 mg by mouth 2 (two) times daily as needed (Fever blisters). Reported on 01/10/2016  2  . VIRTUSSIN A/C 100-10 MG/5ML syrup Take 5 mLs by mouth every 6 (six) hours as needed for cough.  0  . cyanocobalamin (,VITAMIN B-12,) 1000 MCG/ML injection Inject 1 mL into the skin every 30 (thirty) days.  11   No current facility-administered medications for this visit.     Neurologic: Headache: Yes Seizure: No Paresthesias:No  Musculoskeletal: Strength & Muscle Tone: within normal limits Gait & Station: normal Patient leans: N/A  Psychiatric Specialty Exam: Review of Systems  Musculoskeletal: Positive for back pain and joint pain.  Psychiatric/Behavioral: Positive for depression. The patient is nervous/anxious.     Blood pressure 126/80, pulse 66, height 5' 6"  (1.676 m), weight 216 lb (98 kg), last menstrual period 09/18/1979.Body mass index is 34.86 kg/m.  General Appearance: Casual  Eye Contact:  Good  Speech:  Clear and Coherent  Volume:  Normal  Mood:  Anxious and Dysphoric  Affect:  Constricted  Thought Process:  Goal Directed  Orientation:  Full (Time, Place, and Person)  Thought Content:  Rumination  Suicidal Thoughts:  No  Homicidal Thoughts:  No  Memory:  Immediate;   Fair Recent;   Fair Remote;   Fair  Judgement:  Good  Insight:  Present  Psychomotor Activity:  Decreased  Concentration:  Concentration: Fair and Attention Span: Fair  Recall:  Good  Fund of Knowledge:Good  Language: Good  Akathisia:  No  Handed:  Right  AIMS (if indicated):  0  Assets:  Communication Skills Desire for Improvement Resilience Social Support  ADL's:   Intact  Cognition: WNL  Sleep: Fair   Assessment: Major depressive disorder, recurrent.  Generalized anxiety disorder.  Plan: I review her symptoms, history, collateral information from other provider and current medication.  Patient is taking Prozac 10 mg.  She is seeing some improvement with the Prozac however she believed the dose is not strong.  We recommended to try Prozac 20 mg.  Continue trazodone 50 mg at bedtime.  Reassurance given.  She is seeing Amada Jupiter for therapy.  We discussed resources to cope with her issues.  In next few days she is going to meet with legal aid, [employer] manager, [employer] streamer.  She is hoping in the next few days she may able to come up with a plan for her living arrangements.  She started going to church and she is feeling better.  We discussed medication side effects in detail.  I recommended to call us back if she has any question, concern if she feels worsening of the symptoms.  We discussed safety concerns at any time having active suicidal thoughts or homicidal thought that she need to call 911 or go to local emergency room.  Follow-up in 4 weeks.  [name], MD 5/7/201911:50 AM

## 2018-01-22 ENCOUNTER — Other Ambulatory Visit (HOSPITAL_COMMUNITY): Payer: Medicare Other

## 2018-01-23 ENCOUNTER — Other Ambulatory Visit (HOSPITAL_COMMUNITY): Payer: Medicare Other

## 2018-01-24 ENCOUNTER — Other Ambulatory Visit (HOSPITAL_COMMUNITY): Payer: Medicare Other

## 2018-01-27 ENCOUNTER — Telehealth: Payer: Self-pay | Admitting: Pharmacy Technician

## 2018-01-27 ENCOUNTER — Other Ambulatory Visit (HOSPITAL_COMMUNITY): Payer: Medicare Other

## 2018-01-27 NOTE — Telephone Encounter (Signed)
Oral Oncology Patient Advocate Encounter  Met patient in [facility] lobby to complete application for [place] in an effort to reduce patient's out of pocket expense for Eliquis to $0.    Application completed and faxed to [phone].   [employer] patient assistance phone number for follow up is [phone].   This encounter will be updated until final determination.  Fabio Asa. Melynda Keller, [place] Patient [place] [phone] 01/27/2018 4:18 PM

## 2018-01-28 ENCOUNTER — Ambulatory Visit: Payer: Medicare Other | Admitting: Psychiatry

## 2018-01-28 ENCOUNTER — Other Ambulatory Visit (HOSPITAL_COMMUNITY): Payer: Medicare Other

## 2018-01-29 ENCOUNTER — Other Ambulatory Visit (HOSPITAL_COMMUNITY): Payer: Medicare Other

## 2018-01-30 ENCOUNTER — Other Ambulatory Visit (HOSPITAL_COMMUNITY): Payer: Medicare Other

## 2018-01-31 ENCOUNTER — Other Ambulatory Visit (HOSPITAL_COMMUNITY): Payer: Medicare Other

## 2018-02-07 NOTE — Telephone Encounter (Signed)
Oral Oncology Patient Advocate Encounter  Followed up on the status of the patient's application for assistance.    The representative at [facility] stated that the patient had been denied due to income.  They also stated that they had spoken with the patient on 4/30 and requested that she send in a copy of her [place] letter.  The patient does not recall receiving this call or having this conversation.    I was told by [employer] that if they received a copy of her proof of income, they would re-evaluate her application based on that income.    She has faxed me a copy of her SSI award letter and I have faxed it to [place] at [phone].    Fabio Asa. Melynda Keller, [place] Patient [place] [phone] 02/07/2018 3:19 PM

## 2018-02-15 ENCOUNTER — Other Ambulatory Visit (HOSPITAL_COMMUNITY): Payer: Self-pay | Admitting: Psychiatry

## 2018-02-15 DIAGNOSIS — F322 Major depressive disorder, single episode, severe without psychotic features: Secondary | ICD-10-CM

## 2018-02-17 DIAGNOSIS — F332 Major depressive disorder, recurrent severe without psychotic features: Secondary | ICD-10-CM | POA: Diagnosis not present

## 2018-02-25 ENCOUNTER — Ambulatory Visit (HOSPITAL_COMMUNITY): Payer: Medicare Other | Admitting: Psychiatry

## 2018-02-26 ENCOUNTER — Other Ambulatory Visit (HOSPITAL_COMMUNITY): Payer: Self-pay

## 2018-02-26 DIAGNOSIS — F322 Major depressive disorder, single episode, severe without psychotic features: Secondary | ICD-10-CM

## 2018-02-26 MED ORDER — FLUOXETINE HCL 20 MG PO CAPS: 20.0000 mg | ORAL_CAPSULE | Freq: Every day | ORAL | 0 refills | Status: DC

## 2018-02-26 NOTE — Progress Notes (Signed)
Sent in refill per Silvio Pate

## 2018-02-27 ENCOUNTER — Encounter (INDEPENDENT_AMBULATORY_CARE_PROVIDER_SITE_OTHER): Payer: Medicare Other

## 2018-02-27 ENCOUNTER — Encounter: Payer: Self-pay | Admitting: Pharmacist

## 2018-02-27 ENCOUNTER — Encounter (HOSPITAL_COMMUNITY): Payer: Self-pay | Admitting: Psychiatry

## 2018-02-27 ENCOUNTER — Ambulatory Visit (INDEPENDENT_AMBULATORY_CARE_PROVIDER_SITE_OTHER): Payer: Medicare Other | Admitting: Psychiatry

## 2018-02-27 VITALS — BP 128/80 | HR 68 | Ht 65.5 in | Wt 212.6 lb

## 2018-02-27 DIAGNOSIS — F411 Generalized anxiety disorder: Secondary | ICD-10-CM

## 2018-02-27 DIAGNOSIS — F322 Major depressive disorder, single episode, severe without psychotic features: Secondary | ICD-10-CM

## 2018-02-27 DIAGNOSIS — Z87891 Personal history of nicotine dependence: Secondary | ICD-10-CM

## 2018-02-27 DIAGNOSIS — Z9989 Dependence on other enabling machines and devices: Secondary | ICD-10-CM | POA: Insufficient documentation

## 2018-02-27 DIAGNOSIS — G629 Polyneuropathy, unspecified: Secondary | ICD-10-CM | POA: Insufficient documentation

## 2018-02-27 MED ORDER — VORTIOXETINE HBR 10 MG PO TABS: 10.0000 mg | ORAL_TABLET | Freq: Every day | ORAL | 0 refills | Status: DC

## 2018-02-27 MED ORDER — TRAZODONE HCL 100 MG PO TABS: 100.0000 mg | ORAL_TABLET | Freq: Every evening | ORAL | 0 refills | Status: DC | PRN

## 2018-02-27 NOTE — Progress Notes (Signed)
[facility] MD/PA/NP OP Progress Note  02/27/2018 10:18 AM Hayley [name]  MRN:  [id number]  Chief Complaint: I do not think Prozac working.  I have a lot of shakes and tremors with the Prozac.  HPI: Charese came for her follow-up appointment.  She is 72 year old Caucasian currently separated female who was seen first time 4 weeks ago.  Patient had taken an overdose of Percocet in March 2019 and upon discharge she was recommended IOP.  We started her on Prozac which in the beginning she felt working very well.  We increase her dose to 20 mg but she felt worsening of the tremors and she decided to stop.  Patient has multiple health issues and financial problems.  She feels very anxious and uncertain about her future.  She had significant financial problems.  She had apply for Medicaid and also trying to get legal help so she can stay in her home.  Patient is upset on her husband's children who does not support her financially.  She was pleased last Saturday when she was able to talk to her husband on the phone when he went to the doctor's appointment.  She admitted having crying spells, nightmares, sadness and disappointment.  However she denies any suicidal thoughts.  She admitted racing thoughts at bedtime.  Her biggest concern is financial burden and uncertainty.  She denies any paranoia, hallucination, self abusive behavior.  Today she told that her daughter who had bipolar disorder has been taking a lot of medication and wondering if she can take any bipolar medication.  Her daughter takes Depakote and olanzapine.  Patient has no history of mania or psychosis.  Today she also mentioned that she has taken Zoloft and Paxil in the past by her primary care physician that did not work.  She is concerned about her headaches, tremors.  She has essential tremors but after taking the Prozac it got worse.  She endorsed that she does not have money to get the refills and she is interested in samples.  Patient has hypertension,  sleep apnea, migraine, GERD, hyperthyroidism, asthma, obesity, diabetes mellitus, chronic pain and history of fall.  She is hoping that her Social Security check will start coming on June 15.  She admitted not able to see her therapist Amada Jupiter because of finances but like to resume her therapy after June 15 when she could check.  We also did gene testing and I discussed with her about her results.  Her Celexa, Effexor, Prozac, Zoloft, Paxil and Lexapro comes in a favorable section.  Her Wellbutrin is in the middle.  Most of antipsychotic and anxiolytics are in favorable section.  Her Lamictal is less favorable.  Visit Diagnosis:    ICD-10-CM   1. GAD (generalized anxiety disorder) F41.1   2. MDD (major depressive episode), single episode, severe, no psychosis ([place]) F32.2 traZODone (DESYREL) 100 MG tablet    vortioxetine HBr (TRINTELLIX) 10 MG TABS tablet    Past Psychiatric History: Reviewed Patient has history of depression and anxiety and prescribed Effexor in her 65s but it stopped working after a while.  She also tried Zoloft, Paxil and Wellbutrin from primary care physician but they did not work.  She will be tried Prozac but patient do not see any improvement.  Patient has history of suicidal attempt in March 2019 when she took overdose on Percocet.  She completed intensive outpatient program.    Past Medical History:  Past Medical History:  Diagnosis Date  . Anemia   .  Anxiety   . Bronchitis    hx of   . Clotting disorder ([place]) 02-17-14   S/p appendectomy-developed Pulmonary emboli-tx. warfarin-d/c 1 yr ago.  . Colon polyps   . Depression   . Diabetes mellitus without complication ([place])    "borderline"- oral med  . Disorder of vocal cords    spasmotic dysphonia ,02-17-14 has" whispery voice-low tone"  . Hyperthyroidism   . Kidney stones   . Migraines   . Nausea Nov 15, 2014   cycle of migraine headaches and nausea  . Neuromuscular disorder ([place])    hands and throat"spasmodic  dysphonia", tremors - thimb and forefinger   . Obesity, Class III, BMI 40-49.9 (morbid obesity) ([place]) 04/27/2010   02-17-14 reports some weight loss- intentional  . Osteoarthritis    in back(chronic pain)  . Pelvic pain    02-17-14 "states thinks its back related on left groin"  . Pulmonary emboli [facility])    s/p Appendectomy '13 [facility]  . Reflux   . Right knee pain   . Sleep apnea    cpap - settings at 4   . Toxic goiter     Past Surgical History:  Procedure Laterality Date  . ABDOMINAL HYSTERECTOMY    . APPENDECTOMY     3'13 [facility] s/p developed  Pulmonary Emboli  . BOTOX INJECTION     for migranes  . CATARACT EXTRACTION, BILATERAL Bilateral 03/2013   . CHOLECYSTECTOMY    . HAND SURGERY  2004   both hands  . KIDNEY STONE SURGERY    . KNEE ARTHROSCOPY  1998   right  . LAPAROSCOPIC APPENDECTOMY  11/27/2011   Procedure: APPENDECTOMY LAPAROSCOPIC;  Surgeon: Adin Hector, MD;  Location: [facility] ORS;  Service: General;  Laterality: N/A;  . SPINAL CORD STIMULATOR IMPLANT  06/04/2016   Dr. Alfonse [facility] at [facility]  . TOTAL KNEE ARTHROPLASTY Right 12/06/2014   Procedure: RIGHT TOTAL KNEE ARTHROPLASTY;  Surgeon: Gaynelle Arabian, MD;  Location: [facility];  Service: Orthopedics;  Laterality: Right;  . TOTAL KNEE REVISION Left 02/24/2014   Procedure: LEFT TOTAL KNEE REVISION;  Surgeon: Gearlean Alf, MD;  Location: [facility];  Service: Orthopedics;  Laterality: Left;  . vocal cord surgery  02-17-14   01-12-14 done in [place] [facility](s/p selective denervation/reinnervation recurrent laryngeal nerve surgery)  . zenkers diverticulum      Family Psychiatric History: Reviewed.  Family History:  Family History  Problem Relation Age of Onset  . Heart disease Mother   . Pneumonia Father   . Cirrhosis Father   . Hypertension Father   . Alcoholism Father   . Heart attack Paternal Grandfather   . Heart attack Maternal Grandfather   . Heart attack Paternal Grandmother   . Heart attack Maternal  Grandmother     Social History:  Social History   Socioeconomic History  . Marital status: Married    Spouse name: Not on file  . Number of children: 5  . Years of education: 30  . Highest education level: Not on file  Occupational History  . Occupation: Retired  Scientific laboratory technician  . Financial resource strain: Somewhat hard  . Food insecurity:    Worry: Sometimes true    Inability: Sometimes true  . Transportation needs:    Medical: Yes    Non-medical: Yes  Tobacco Use  . Smoking status: Former Smoker    Last attempt to quit: 01/03/1982    Years since quitting: 36.1  . Smokeless tobacco: Never Used  Substance and Sexual Activity  . Alcohol use: No  . Drug use: No  . Sexual activity: Not Currently  Lifestyle  . Physical activity:    Days per week: 0 days    Minutes per session: 0 min  . Stress: Very much  Relationships  . Social connections:    Talks on phone: Once a week    Gets together: Once a week    Attends religious service: More than 4 times per year    Active member of club or organization: No    Attends meetings of clubs or organizations: Never    Relationship status: Separated  Other Topics Concern  . Not on file  Social History Narrative   Lives at home alone at this time.   Right-handed.   2 cups caffeine per day.    Allergies:  Allergies  Allergen Reactions  . Bee Venom Anaphylaxis  . Tape Rash    Rash with paper tape  . Etodolac Diarrhea and Nausea And Vomiting  . Morphine Other (See Comments)    Hallucinations  . Other Itching and Swelling  . Poison Ivy Extract  [Poison Ivy Extract] Itching and Swelling  . Propoxyphene Nausea And Vomiting  . Propoxyphene N-Acetaminophen Nausea And Vomiting  . Penicillins Rash    Has patient had a PCN reaction causing immediate rash, facial/tongue/throat swelling, SOB or lightheadedness with hypotension: Yes Has patient had a PCN reaction causing severe rash involving mucus membranes or skin necrosis: No Has  patient had a PCN reaction that required hospitalization No Has patient had a PCN reaction occurring within the last 10 years: No If all of the above answers are "NO", then may proceed with Cephalosporin use.     Metabolic Disorder Labs: Lab Results  Component Value Date   HGBA1C 9.4 (H) 12/19/2016   MPG 157 03/23/2016   MPG 154 06/04/2015   No results found for: PROLACTIN Lab Results  Component Value Date   CHOL 159 03/23/2016   TRIG 122 11/17/2017   HDL 31 (L) 03/23/2016   CHOLHDL 5.1 03/23/2016   VLDL 35 03/23/2016   LDLCALC 93 03/23/2016   Lab Results  Component Value Date   TSH 1.348 11/17/2017   TSH 1.140 12/19/2016    Therapeutic Level Labs: No results found for: LITHIUM No results found for: VALPROATE No components found for:  CBMZ  Current Medications: Current Outpatient Medications  Medication Sig Dispense Refill  . albuterol (PROVENTIL HFA;VENTOLIN HFA) 108 (90 BASE) MCG/ACT inhaler Inhale 1 puff into the lungs every 6 (six) hours as needed for wheezing or shortness of breath.    [name] albuterol (PROVENTIL) (2.5 MG/3ML) 0.083% nebulizer solution Take 2.5 mg by nebulization every 6 (six) hours as needed for wheezing or shortness of breath. Reported on 01/10/2016    . benzonatate (TESSALON) 100 MG capsule Take 100 mg by mouth 2 (two) times daily as needed for cough.    . cyanocobalamin (,VITAMIN B-12,) 1000 MCG/ML injection Inject 1 mL into the skin every 30 (thirty) days.  11  . diphenhydrAMINE (BENADRYL) 25 MG tablet Take 50 mg by mouth every 6 (six) hours as needed for itching.    [name] glipiZIDE (GLUCOTROL XL) 5 MG 24 hr tablet Take 5 mg by mouth daily with breakfast.    . levothyroxine (SYNTHROID, LEVOTHROID) 25 MCG tablet Take 25 mcg by mouth every morning.     . mometasone-formoterol (DULERA) 100-5 MCG/ACT AERO Inhale 2 puffs into the lungs 2 (two) times daily.    [name]  propranolol (INDERAL) 60 MG tablet Take 1 tablet (60 mg total) by mouth 2 (two) times daily. 60  tablet 0  . traZODone (DESYREL) 50 MG tablet Take 1 tablet (50 mg total) by mouth at bedtime for 15 days. 15 tablet 0  . valACYclovir (VALTREX) 1000 MG tablet Take 2,000 mg by mouth 2 (two) times daily as needed (Fever blisters). Reported on 01/10/2016  2  . VIRTUSSIN A/C 100-10 MG/5ML syrup Take 5 mLs by mouth every 6 (six) hours as needed for cough.  0  . apixaban (ELIQUIS) 2.5 MG TABS tablet Take 1 tablet (2.5 mg total) by mouth 2 (two) times daily. (Patient not taking: Reported on 02/27/2018) 180 tablet 3  . FLUoxetine (PROZAC) 20 MG capsule Take 1 capsule (20 mg total) by mouth daily. (Patient not taking: Reported on 02/27/2018) 30 capsule 0  . sitaGLIPtin (JANUVIA) 100 MG tablet Take 100 mg by mouth daily.    . SUMAtriptan (IMITREX) 25 MG tablet Take 1 tablet (25 mg total) by mouth every 2 (two) hours as needed for migraine. May repeat in 2 hours if headache persists or recurs. (Patient not taking: Reported on 02/27/2018) 10 tablet 0  . SUMAtriptan Succinate Refill 4 MG/0.5ML SOCT Inject 4 mg into the skin as needed. Every 3 months (Patient not taking: Reported on 02/27/2018) 12 Syringe 4   No current facility-administered medications for this visit.      Musculoskeletal: Strength & Muscle Tone: within normal limits Gait & Station: normal Patient leans: N/A  Psychiatric Specialty Exam: Review of Systems  Musculoskeletal: Positive for back pain and joint pain.  Neurological: Positive for headaches.    Blood pressure 128/80, pulse 68, height 5' 5.5" (1.664 m), weight 212 lb 9.6 oz (96.4 kg), last menstrual period 09/18/1979.Body mass index is 34.84 kg/m.  General Appearance: Casual  Eye Contact:  Fair  Speech:  Clear and Coherent and Slow  Volume:  Normal  Mood:  Anxious  Affect:  Congruent  Thought Process:  Goal Directed  Orientation:  Full (Time, Place, and Person)  Thought Content: Rumination   Suicidal Thoughts:  No  Homicidal Thoughts:  No  Memory:  Immediate;    Fair Recent;   Fair Remote;   Fair  Judgement:  Good  Insight:  Good  Psychomotor Activity:  Decreased and Tremor  Concentration:  Concentration: Fair and Attention Span: Fair  Recall:  [employer] of Knowledge: Fair  Language: Fair  Akathisia:  No  Handed:  Right  AIMS (if indicated): not done  Assets:  Communication Skills Desire for Improvement Housing Resilience  ADL's:  Intact  Cognition: WNL  Sleep:  Fair   Screenings:   Assessment and Plan: Major depressive disorder, recurrent.  Generalized anxiety disorder.  Patient continued to exhibit symptoms of anxiety and depression.  She is no longer taking Prozac because of the tremors.  We discussed gene testing results with the patient.  All those medications that she had tried including Zoloft, Paxil, Prozac, Effexor are in a favorable section.  However patient does not see any improvement.  We will try Trintellix 10 mg as patient cannot afford any medication at this time and preferred to try samples.  If medicine working then she will apply for patient assistance program.  I will also increase trazodone 100 mg to help her insomnia.  I do believe most of her symptoms are consistent with her living situation and psychosocial stressors.  I encouraged to resume therapy with Amada Jupiter for CBT.  Recommended to call us back if she has any question, concern or if she feels worsening of the symptoms.  I will see her again in 4 weeks. Time spent 25 minutes.  More than 50% of the time spent in psychoeducation, counseling and coordination of care.  Discuss safety plan that anytime having active suicidal thoughts or homicidal thoughts then patient need to call 911 or go to the local emergency room.     [name], MD 02/27/2018, 10:18 AM

## 2018-02-27 NOTE — Progress Notes (Signed)
Oral Chemotherapy Pharmacist Encounter  Dispensed samples to patient:  Medication: Eliquis 5mg  tablets Instructions: Take 1/2 tablet (2.5mg ) by mouth 2 times daily Quantity dispensed: 56 Days supply: 56 Manufacturer: [employer] Lot: [id number] Exp: 11/2019  Johny Drilling, PharmD, BCPS, BCOP 02/27/2018 11:52 AM [facility] [phone]

## 2018-03-04 NOTE — Telephone Encounter (Signed)
Oral Oncology Patient Advocate Encounter  Received notification from [employer] Patient Assistance program that patient has been successfully enrolled into their program to receive Eliquis from the manufacturer at $0 out of pocket until 09/16/2018.   I called and left a voicemail for the patient with the details of her enrollment.  She will have to re-enroll in the program in order to receive continued enrollment in 2020.  The dispensing pharmacy [facility]) will be reaching out to Hayley [name] in the next few days to arrange for shipment of the medication to her home.     Oral Oncology Clinic will continue to follow.  Gilmore Laroche, CPhT, [facility] Oral [facility] Patient Advocate [phone] 03/04/2018 1:21 PM

## 2018-03-07 DIAGNOSIS — F332 Major depressive disorder, recurrent severe without psychotic features: Secondary | ICD-10-CM | POA: Diagnosis not present

## 2018-03-10 ENCOUNTER — Ambulatory Visit (INDEPENDENT_AMBULATORY_CARE_PROVIDER_SITE_OTHER): Payer: Medicare Other | Admitting: Family Medicine

## 2018-03-11 ENCOUNTER — Ambulatory Visit (INDEPENDENT_AMBULATORY_CARE_PROVIDER_SITE_OTHER): Payer: Medicare Other | Admitting: Family Medicine

## 2018-03-11 DIAGNOSIS — F332 Major depressive disorder, recurrent severe without psychotic features: Secondary | ICD-10-CM | POA: Diagnosis not present

## 2018-03-19 ENCOUNTER — Ambulatory Visit (INDEPENDENT_AMBULATORY_CARE_PROVIDER_SITE_OTHER): Payer: Medicare Other | Admitting: Family Medicine

## 2018-03-27 ENCOUNTER — Other Ambulatory Visit (HOSPITAL_COMMUNITY): Payer: Self-pay | Admitting: Psychiatry

## 2018-03-27 DIAGNOSIS — F322 Major depressive disorder, single episode, severe without psychotic features: Secondary | ICD-10-CM

## 2018-03-28 ENCOUNTER — Other Ambulatory Visit (HOSPITAL_COMMUNITY): Payer: Self-pay

## 2018-03-28 DIAGNOSIS — F322 Major depressive disorder, single episode, severe without psychotic features: Secondary | ICD-10-CM

## 2018-03-28 MED ORDER — TRAZODONE HCL 100 MG PO TABS: 100.0000 mg | ORAL_TABLET | Freq: Every evening | ORAL | 0 refills | Status: DC | PRN

## 2018-04-01 ENCOUNTER — Ambulatory Visit (HOSPITAL_COMMUNITY): Payer: Self-pay | Admitting: Psychiatry

## 2018-04-02 DIAGNOSIS — F332 Major depressive disorder, recurrent severe without psychotic features: Secondary | ICD-10-CM | POA: Diagnosis not present

## 2018-04-17 ENCOUNTER — Encounter (HOSPITAL_COMMUNITY): Payer: Self-pay | Admitting: Psychiatry

## 2018-04-17 ENCOUNTER — Ambulatory Visit (INDEPENDENT_AMBULATORY_CARE_PROVIDER_SITE_OTHER): Payer: Medicare Other | Admitting: Psychiatry

## 2018-04-17 DIAGNOSIS — F322 Major depressive disorder, single episode, severe without psychotic features: Secondary | ICD-10-CM

## 2018-04-17 MED ORDER — VORTIOXETINE HBR 10 MG PO TABS: 10.0000 mg | ORAL_TABLET | Freq: Every day | ORAL | 0 refills | Status: DC

## 2018-04-17 NOTE — Progress Notes (Signed)
Spoke with patient while she was visiting in office for a brief check-in.  She was here visiting with her therapist.  Patient seems to be stable on the current regimen of Trintellix 10 mg daily.  Denies any significant side effects or acute safety concerns.  She continues to have a good supportive environment from her husband at home.  Spent time processing some of the limits and boundaries she is working on setting and encouraged her to continue in individual therapy.

## 2018-04-18 DIAGNOSIS — R197 Diarrhea, unspecified: Secondary | ICD-10-CM | POA: Diagnosis not present

## 2018-04-18 DIAGNOSIS — R05 Cough: Secondary | ICD-10-CM | POA: Diagnosis not present

## 2018-04-18 DIAGNOSIS — E039 Hypothyroidism, unspecified: Secondary | ICD-10-CM | POA: Diagnosis not present

## 2018-04-18 DIAGNOSIS — E119 Type 2 diabetes mellitus without complications: Secondary | ICD-10-CM | POA: Diagnosis not present

## 2018-04-18 DIAGNOSIS — F331 Major depressive disorder, recurrent, moderate: Secondary | ICD-10-CM | POA: Diagnosis not present

## 2018-04-18 DIAGNOSIS — Z7984 Long term (current) use of oral hypoglycemic drugs: Secondary | ICD-10-CM | POA: Diagnosis not present

## 2018-04-18 DIAGNOSIS — Z5181 Encounter for therapeutic drug level monitoring: Secondary | ICD-10-CM | POA: Diagnosis not present

## 2018-04-18 DIAGNOSIS — K580 Irritable bowel syndrome with diarrhea: Secondary | ICD-10-CM | POA: Diagnosis not present

## 2018-05-12 ENCOUNTER — Telehealth (HOSPITAL_COMMUNITY): Payer: Self-pay

## 2018-05-12 NOTE — Telephone Encounter (Signed)
Patient is calling about the Trintellix, patient states that she is still very depressed, no joy and no energy. Patient also reports diarrhea and tremor worse since starting the Trintellix. She states that it is just not working and her depression is worse. Please review and advise, thank you

## 2018-05-13 NOTE — Telephone Encounter (Signed)
I recommend that she schedule a follow up visit to discuss other options. I am booked the rest of the week, so perhaps Tanika or Dr. Dwyane Dee can assist in cross coverage for Dr. Adele Schilder

## 2018-05-29 ENCOUNTER — Encounter: Payer: Self-pay | Admitting: Neurology

## 2018-05-29 ENCOUNTER — Ambulatory Visit: Payer: Self-pay | Admitting: Neurology

## 2018-05-29 ENCOUNTER — Telehealth: Payer: Self-pay | Admitting: *Deleted

## 2018-05-29 NOTE — Telephone Encounter (Signed)
No showed follow up appointment. 

## 2018-06-03 DIAGNOSIS — R32 Unspecified urinary incontinence: Secondary | ICD-10-CM | POA: Diagnosis not present

## 2018-06-03 DIAGNOSIS — R82998 Other abnormal findings in urine: Secondary | ICD-10-CM | POA: Diagnosis not present

## 2018-06-20 ENCOUNTER — Telehealth: Payer: Self-pay | Admitting: Neurology

## 2018-06-20 NOTE — Telephone Encounter (Signed)
Spoke to patient - her migraine frequency has increased and she would like to discuss restarting Botox.  She has a pending appt on 06/25/18 with Dr. [name].

## 2018-06-20 NOTE — Telephone Encounter (Signed)
Pt requesting a call would like to discuss botox for worsening h/a

## 2018-06-22 ENCOUNTER — Other Ambulatory Visit (HOSPITAL_COMMUNITY): Payer: Self-pay | Admitting: Psychiatry

## 2018-06-22 DIAGNOSIS — F322 Major depressive disorder, single episode, severe without psychotic features: Secondary | ICD-10-CM

## 2018-06-25 ENCOUNTER — Telehealth: Payer: Self-pay | Admitting: *Deleted

## 2018-06-25 ENCOUNTER — Encounter

## 2018-06-25 ENCOUNTER — Ambulatory Visit (INDEPENDENT_AMBULATORY_CARE_PROVIDER_SITE_OTHER): Payer: Medicare Other | Admitting: Neurology

## 2018-06-25 ENCOUNTER — Encounter: Payer: Self-pay | Admitting: Neurology

## 2018-06-25 VITALS — BP 123/76 | HR 125 | Ht 66.0 in | Wt 207.0 lb

## 2018-06-25 DIAGNOSIS — G43709 Chronic migraine without aura, not intractable, without status migrainosus: Secondary | ICD-10-CM

## 2018-06-25 DIAGNOSIS — G250 Essential tremor: Secondary | ICD-10-CM

## 2018-06-25 DIAGNOSIS — IMO0002 Reserved for concepts with insufficient information to code with codable children: Secondary | ICD-10-CM

## 2018-06-25 NOTE — Progress Notes (Signed)
No chief complaint on file.     PATIENT: Hayley [name] DOB: [birth year]  No chief complaint on file.    HISTORICAL  Hayley [name] is a 72 years old right-handed female, seen in refer by hand surgeon Dr. Daryll Brod, and her primary care PA Flora Lipps for evaluation of neck pain, radiating pain to bilateral shoulder, upper extremity  She has past medical history of spasmodic dysphonia, status post surgical correction, also had a history of hypertension, diabetes, DVT, PE following surgical procedure, most recent was right knee replacement in March 2016, is taking anticoagulation pradaxa, chronic low back pain, previous history of epidural injection for low back pain.  She fell from law mower in June 2016, landed on her back, she was able to got up, finished mowing her lawn, later that day, she noticed worsening low back pain, significant neck pain, radiating pain to her bilateral shoulder, upper extremity, hands, she also noticed bilateral hands paresthesia, burning sensation, subjective weakness, she had worsening urinary urgency, wear pads occasionally, mild unbalanced gait, mild constipation  She was evaluated by [facility] orthopedic surgeon Dr. Rolena Infante for her worsening low back pain, reported MRI of lumbar spine at [place] in July 2016, was told she is not a surgical candidate, she does has multilevel lumbar degenerative disc disease.  She is also concerned about her bilateral hands tremor, getting worse since 2015, most noticeable when she writes, holding utensils, there was no similar family history  UPDATE August 24th 2016: She continues to complain bilateral neck pain, radiating pain to bilateral shoulder, arm, We have reviewed MRI cervical spine (without) demonstrating: 1. At C3-4: disc bulging and uncovertebral joint hypertrophy and facet hypertrophy with severe left foraminal stenosis 2. At C4-5: disc bulging and uncovertebral joint hypertrophy with severe biforaminal  stenosis 3. At C5-6: disc bulging and uncovertebral joint hypertrophy with moderate right and severe left foraminal stenosis 4. At C6-7: disc bulging and uncovertebral joint hypertrophy with moderate biforaminal stenosis 5. No intrinsic or compressive spinal cord lesions  Propanolol 40 mg twice a day has helped her tremor, she denies significant side effect.  UPDATE June 26th 2017: She continue complains of bilateral lower extremity posturing tremor, significant neck pain, radiating pain to bilateral upper extremity, increased unsteady gait, she has fell few times, worsening bilateral lower extremity paresthesia  UPDATE Sept 1 2017: She has worsening low back pain, it gets worse with any movement, difficulty changing bed linen, she is no longer cooking,   She recently had spinal stimulator trial by [facility] by Dr. Delfino Lovett Rauck,,was deemed to be a potential candidate, she did responding well to the spinal stimulator. She complains of midline constant low back pain, radiating down to right hip, right leg and left leg, getting worse with movement, she has bladder urgency, no bowel incontinence.   She had physical therapy, last in July 2017,    She also complains of migraine, seems to get more frequent since March 2017, she is now having headaches 3-5/week, lasting for 4 hours or longer, with light noise sensitivity nauseous,  She used to get Botox injection as migraine prevention from Dr. Samuel Germany, responded very well, last injection was in 2015, responding well. She is taking Imitrex, Zofran as needed  She is on antibiotics for chronic UTI,  Update June 20 2016: She came in for Botox injection as migraine prevention, also complains worsening bilateral hands tremor, hope to received EMG guided Botox injection for her right hand tremor.  UPDATE Sep 27 2015: She had sudden onset of left shoulder pain, neck pain, I personally reviewed x-ray of the cervical spine, evidence of  multilevel degenerative disc disease most severe C5-6 C6-7, mild to moderate bilateral foraminal stenosis  She responded well to previous Botox injection in October 2017, reported 75% improvement, over past 3 months, used Imitrex injection once, 4 tablets of Imitrex 25 mg, works well,  She was not sure about the benefit of EMG guided botulism toxin injection for her right hand tremor,  UPDATE December 19 2016: She complains of frequent headaches since December 16 2016, bilateral occipital region, pressure, pounding, nauseous, light sensitivity, she still complains 7 out of 10 headaches, despite multiple dose of Imitrex 25 mg tablets, and Imitrex injection, she has used up all her supplies, she also noticed increased bilateral hands tremor, feeling electronic sensation underneath her skin, to her shoulder down to her arms and legs, it is out of her control, but she denied anxiety or depression,  She has self adjusted her medication recently, she stopped the Wellbutrin by herself for a while, started about 3 weeks ago, also has run out of propanolol,  Husband also reported that she spent most of time in her bed, not feeling well.  UPDATE January 02 2017: Her headache overall is under good control, return for Botox injection as migraine prevention, also extra injection for her right hand tremor, writer's cramp  UPDATE Sept 19 2018: She responded well to previous injection for migraine prevention but did not help her essential tremor much,  UPDATE Jun 25 2018: Her husband has stroke in Dec 2018, she has gone through significant emotional and financial stress, suffered a significant depression, attempted suicidal overdose on Percocet in March 2019, she also presented with significant GI symptoms, nausea vomiting abdominal pain, was treated with IV fluid, diagnosed with pancreatitis, was restarted on her antidepression Wellbutrin, trazodone, she is now discharged home, still dealing with significant depression,  complains of intermittent diarrhea, stomach pain, lack of energy, She continue have frequent headaches, previous Botox works well for her migraine headaches, wants to continue Botox injection.  I also reviewed her geneSight psychotropic pharmacogenomic test result, that is most related to antipsychotics, antidepression use  REVIEW OF SYSTEMS: Full 14 system review of systems performed and notable only for above   ALLERGIES: Allergies  Allergen Reactions  . Bee Venom Anaphylaxis  . Tape Rash    Rash with paper tape  . Etodolac Diarrhea and Nausea And Vomiting  . Morphine Other (See Comments)    Hallucinations  . Other Itching and Swelling  . Poison Ivy Extract  [Poison Ivy Extract] Itching and Swelling  . Propoxyphene Nausea And Vomiting  . Propoxyphene N-Acetaminophen Nausea And Vomiting  . Penicillins Rash    Has patient had a PCN reaction causing immediate rash, facial/tongue/throat swelling, SOB or lightheadedness with hypotension: Yes Has patient had a PCN reaction causing severe rash involving mucus membranes or skin necrosis: No Has patient had a PCN reaction that required hospitalization No Has patient had a PCN reaction occurring within the last 10 years: No If all of the above answers are "NO", then may proceed with Cephalosporin use.     HOME MEDICATIONS: Current Outpatient Prescriptions  Medication Sig Dispense Refill  . albuterol (PROVENTIL HFA;VENTOLIN HFA) 108 (90 BASE) MCG/ACT inhaler Inhale 1 puff into the lungs every 6 (six) hours as needed for wheezing or shortness of breath.    [name] albuterol (PROVENTIL) (2.5 MG/3ML) 0.083% nebulizer solution Take 2.5 mg  by nebulization every 6 (six) hours as needed for wheezing or shortness of breath.    . benzonatate (TESSALON) 100 MG capsule Take 100 mg by mouth 3 (three) times daily as needed for cough.    . clonazePAM (KLONOPIN) 0.5 MG tablet Take 0.5 mg by mouth at bedtime.    . cyclobenzaprine (FLEXERIL) 10 MG tablet Take 10  mg by mouth at bedtime.     [name] EPINEPHrine (EPIPEN) 0.3 mg/0.3 mL DEVI Inject 0.3 mg into the muscle once as needed (Allergic reaction).     . fluorometholone (FML) 0.1 % ophthalmic suspension Place 1 drop into both eyes daily as needed (Eye allergies).   0  . fluticasone (FLONASE) 50 MCG/ACT nasal spray Place 1 spray into both nostrils daily as needed for allergies or rhinitis.    [name] gabapentin (NEURONTIN) 800 MG tablet Take 800-1,600 mg by mouth 2 (two) times daily. 800 in the morning and 1600 at pm    . HYDROcodone-acetaminophen (NORCO/VICODIN) 5-325 MG per tablet Take one to two tablets by mouth every 4 hours as needed for pain. Do not exceed 4gm of Tylenol in 24 hours 360 tablet 0  . levothyroxine (SYNTHROID, LEVOTHROID) 25 MCG tablet Take 25 mcg by mouth every morning.     . methocarbamol (ROBAXIN) 500 MG tablet Take 1 tablet (500 mg total) by mouth every 6 (six) hours as needed for muscle spasms. 90 tablet 0  . mometasone-formoterol (DULERA) 100-5 MCG/ACT AERO Inhale 2 puffs into the lungs 2 (two) times daily.    [name] nystatin (MYCOSTATIN) 100000 UNIT/ML suspension Take 5 mLs by mouth daily as needed (Yeast).    . ondansetron (ZOFRAN) 4 MG tablet Take 4 mg by mouth every 8 (eight) hours as needed for nausea or vomiting.    . ondansetron (ZOFRAN-ODT) 8 MG disintegrating tablet Take 8 mg by mouth every 8 (eight) hours as needed for nausea or vomiting.    [name] oxyCODONE (OXY IR/ROXICODONE) 5 MG immediate release tablet Take 1-2 tablets (5-10 mg total) by mouth every 3 (three) hours as needed for moderate pain or severe pain. 90 tablet 0  . pantoprazole (PROTONIX) 40 MG tablet Take 40 mg by mouth daily.    . Phenazopyridine HCl (AZO TABS PO) Take 1 tablet by mouth daily as needed (UTI).    . primidone (MYSOLINE) 50 MG tablet Take 50 mg by mouth at bedtime.    . promethazine (PHENERGAN) 25 MG tablet Take 25 mg by mouth every 6 (six) hours as needed for nausea or vomiting.    . Rivaroxaban (XARELTO) 15 MG  TABS tablet Take 1 tablet (15 mg total) by mouth 2 (two) times daily with a meal. 60 tablet 5  . rizatriptan (MAXALT) 10 MG tablet Take 10 mg by mouth as needed for migraine (migraine). May repeat in 2 hours if needed    . sertraline (ZOLOFT) 100 MG tablet Take 100 mg by mouth daily.    . sitaGLIPtin (JANUVIA) 100 MG tablet Take 100 mg by mouth every evening.     . SUMAtriptan (IMITREX) 6 MG/0.5ML SOLN injection Inject 6 mg into the skin every 2 (two) hours as needed for migraine or headache. May repeat in 2 hours if headache persists or recurs.    . valACYclovir (VALTREX) 1000 MG tablet Take 2,000 mg by mouth 2 (two) times daily as needed (Fever blisters).   2     PAST MEDICAL HISTORY: Past Medical History:  Diagnosis Date  . Anemia   . Anxiety   .  Bronchitis    hx of   . Clotting disorder ([place]) 02-17-14   S/p appendectomy-developed Pulmonary emboli-tx. warfarin-d/c 1 yr ago.  . Colon polyps   . Depression   . Diabetes mellitus without complication ([place])    "borderline"- oral med  . Disorder of vocal cords    spasmotic dysphonia ,02-17-14 has" whispery voice-low tone"  . Hyperthyroidism   . Kidney stones   . Migraines   . Nausea Nov 15, 2014   cycle of migraine headaches and nausea  . Neuromuscular disorder ([place])    hands and throat"spasmodic dysphonia", tremors - thimb and forefinger   . Obesity, Class III, BMI 40-49.9 (morbid obesity) ([place]) 04/27/2010   02-17-14 reports some weight loss- intentional  . Osteoarthritis    in back(chronic pain)  . Pelvic pain    02-17-14 "states thinks its back related on left groin"  . Pulmonary emboli [facility])    s/p Appendectomy '13 [facility]  . Reflux   . Right knee pain   . Sleep apnea    cpap - settings at 4   . Toxic goiter     PAST SURGICAL HISTORY: Past Surgical History:  Procedure Laterality Date  . ABDOMINAL HYSTERECTOMY    . APPENDECTOMY     3'13 [facility] s/p developed  Pulmonary Emboli  . BOTOX INJECTION     for migranes  . CATARACT  EXTRACTION, BILATERAL Bilateral 03/2013   . CHOLECYSTECTOMY    . HAND SURGERY  2004   both hands  . KIDNEY STONE SURGERY    . KNEE ARTHROSCOPY  1998   right  . LAPAROSCOPIC APPENDECTOMY  11/27/2011   Procedure: APPENDECTOMY LAPAROSCOPIC;  Surgeon: Adin Hector, MD;  Location: [facility];  Service: General;  Laterality: N/A;  . SPINAL CORD STIMULATOR IMPLANT  06/04/2016   Dr. Alfonse Ras at [facility]  . TOTAL KNEE ARTHROPLASTY Right 12/06/2014   Procedure: RIGHT TOTAL KNEE ARTHROPLASTY;  Surgeon: Gaynelle Arabian, MD;  Location: [facility];  Service: Orthopedics;  Laterality: Right;  . TOTAL KNEE REVISION Left 02/24/2014   Procedure: LEFT TOTAL KNEE REVISION;  Surgeon: Gearlean Alf, MD;  Location: [facility];  Service: Orthopedics;  Laterality: Left;  . vocal cord surgery  02-17-14   01-12-14 done in [place] [facility](s/p selective denervation/reinnervation recurrent laryngeal nerve surgery)  . zenkers diverticulum      FAMILY HISTORY: Family History  Problem Relation Age of Onset  . Heart disease Mother   . Pneumonia Father   . Cirrhosis Father   . Hypertension Father   . Alcoholism Father   . Heart attack Paternal Grandfather   . Heart attack Maternal Grandfather   . Heart attack Paternal Grandmother   . Heart attack Maternal Grandmother     SOCIAL HISTORY:  Social History   Socioeconomic History  . Marital status: Married    Spouse name: Not on file  . Number of children: 5  . Years of education: 77  . Highest education level: Not on file  Occupational History  . Occupation: Retired  Scientific laboratory technician  . Financial resource strain: Somewhat hard  . Food insecurity:    Worry: Sometimes true    Inability: Sometimes true  . Transportation needs:    Medical: Yes    Non-medical: Yes  Tobacco Use  . Smoking status: Former Smoker    Last attempt to quit: 01/03/1982    Years since quitting: 36.4  . Smokeless tobacco: Never Used  Substance and Sexual Activity  . Alcohol use:  No  . Drug use: No  . Sexual activity: Not Currently  Lifestyle  . Physical activity:    Days per week: 0 days    Minutes per session: 0 min  . Stress: Very much  Relationships  . Social connections:    Talks on phone: Once a week    Gets together: Once a week    Attends religious service: More than 4 times per year    Active member of club or organization: No    Attends meetings of clubs or organizations: Never    Relationship status: Separated  . Intimate partner violence:    Fear of current or ex partner: No    Emotionally abused: No    Physically abused: No    Forced sexual activity: No  Other Topics Concern  . Not on file  Social History Narrative   Lives at home alone at this time.   Right-handed.   2 cups caffeine per day.     PHYSICAL EXAM   There were no vitals filed for this visit.  Not recorded      There is no height or weight on file to calculate BMI.  PHYSICAL EXAMNIATION:  Gen: NAD, conversant, well nourised, obese, well groomed                     Cardiovascular: Regular rate rhythm, no peripheral edema, warm, nontender. Eyes: Conjunctivae clear without exudates or hemorrhage Neck: Supple, no carotid bruise. Pulmonary: Clear to auscultation bilaterally   NEUROLOGICAL EXAM:  MENTAL STATUS: Speech:    Mild dysphonic speech; fluent and spontaneous with normal comprehension.  Cognition:     Orientation to time, place and person     Normal recent and remote memory     Normal Attention span and concentration     Normal Language, naming, repeating,spontaneous speech     Fund of knowledge   CRANIAL NERVES: CN II: Visual fields are full to confrontation. Fundoscopic exam is normal with sharp discs and no vascular changes. Pupils are round equal and briskly reactive to light. CN III, IV, VI: extraocular movement are normal. No ptosis. CN V: Facial sensation is intact to pinprick in all 3 divisions bilaterally. Corneal responses are intact.  CN VII:  Face is symmetric with normal eye closure and smile. CN VIII: Hearing is normal to rubbing fingers CN IX, X: Palate elevates symmetrically. Phonation is normal. CN XI: Head turning and shoulder shrug are intact CN XII: Tongue is midline with normal movements and no atrophy.  MOTOR: Mild bilateral hands postural tremor, normal muscle tone, bulk, strength, Excessive right hand/wrist flexion/ extension,   REFLEXES: Reflexes are 2+ and symmetric at the biceps, triceps, knees, and ankles. Plantar responses are flexor.  SENSORY: Intact to light touch, pinprick, position sense, and vibration sense are intact in fingers and toes.  COORDINATION: Rapid alternating movements and fine finger movements are intact. There is no dysmetria on finger-to-nose and heel-knee-shin.    GAIT/STANCE: Posture is normal. Gait is cautious, mildly unsteady  DIAGNOSTIC DATA (LABS, IMAGING, TESTING) - I reviewed patient records, labs, notes, testing and imaging myself where available.   ASSESSMENT AND PLAN  Hayley [name] is a 72 y.o. female   Chronic migraine headaches  Concurrent with her worsening depression,  Preauthorization for Botox injection,  She just refilled her Imitrex 25 mg as needed, limit the use to twice each week.    Essential Tremor:  Tried primidone in the past without significant improvement, primidone  has the potential interaction with her polypharmacy  Keep propanolol 60 mg twice a day, refilled her medications    Marcial Pacas, M.D. Ph.D.  [facility] [address] [place], [place] [address] Ph: [phone] Fax: [phone]  CC: To Dr. Daryll Brod, Flora Lipps

## 2018-06-25 NOTE — Telephone Encounter (Signed)
Pt arrived at 3:08pm for her 3pm appt.  Dr. [name] was able to still see her.

## 2018-06-25 NOTE — Telephone Encounter (Signed)
No showed follow up appointment. 

## 2018-06-27 ENCOUNTER — Encounter: Payer: Self-pay | Admitting: Neurology

## 2018-07-01 DIAGNOSIS — Z23 Encounter for immunization: Secondary | ICD-10-CM | POA: Diagnosis not present

## 2018-07-01 DIAGNOSIS — R1011 Right upper quadrant pain: Secondary | ICD-10-CM | POA: Diagnosis not present

## 2018-07-01 DIAGNOSIS — Z1211 Encounter for screening for malignant neoplasm of colon: Secondary | ICD-10-CM | POA: Diagnosis not present

## 2018-07-01 DIAGNOSIS — E538 Deficiency of other specified B group vitamins: Secondary | ICD-10-CM | POA: Diagnosis not present

## 2018-07-01 DIAGNOSIS — E611 Iron deficiency: Secondary | ICD-10-CM | POA: Diagnosis not present

## 2018-07-02 DIAGNOSIS — Z961 Presence of intraocular lens: Secondary | ICD-10-CM | POA: Diagnosis not present

## 2018-07-02 DIAGNOSIS — H26492 Other secondary cataract, left eye: Secondary | ICD-10-CM | POA: Diagnosis not present

## 2018-07-02 DIAGNOSIS — H26493 Other secondary cataract, bilateral: Secondary | ICD-10-CM | POA: Diagnosis not present

## 2018-07-02 DIAGNOSIS — H35033 Hypertensive retinopathy, bilateral: Secondary | ICD-10-CM | POA: Diagnosis not present

## 2018-07-02 DIAGNOSIS — H1851 Endothelial corneal dystrophy: Secondary | ICD-10-CM | POA: Diagnosis not present

## 2018-07-16 ENCOUNTER — Encounter: Payer: Self-pay | Admitting: Neurology

## 2018-07-16 ENCOUNTER — Ambulatory Visit (INDEPENDENT_AMBULATORY_CARE_PROVIDER_SITE_OTHER): Payer: Medicare Other | Admitting: Neurology

## 2018-07-16 VITALS — BP 110/64 | HR 63 | Ht 66.0 in | Wt 206.5 lb

## 2018-07-16 DIAGNOSIS — G43709 Chronic migraine without aura, not intractable, without status migrainosus: Secondary | ICD-10-CM | POA: Diagnosis not present

## 2018-07-16 DIAGNOSIS — H26491 Other secondary cataract, right eye: Secondary | ICD-10-CM | POA: Diagnosis not present

## 2018-07-16 NOTE — Progress Notes (Signed)
**  Botox 100 units x 2 vials, NDC 0488-8916-94, Lot [id number], Exp 10/2020, office supply.//mck,rn**

## 2018-07-16 NOTE — Progress Notes (Signed)
Chief Complaint  Patient presents with  . Migraine    Botox 100 units x 2 vials - office supply      PATIENT: Hayley [name] DOB: 04-23-1946  Chief Complaint  Patient presents with  . Migraine    Botox 100 units x 2 vials - office supply     HISTORICAL  Hayley [name] is a 72 years old right-handed female, seen in refer by hand surgeon Dr. Daryll Brod, and her primary care PA Flora Lipps for evaluation of neck pain, radiating pain to bilateral shoulder, upper extremity  She has past medical history of spasmodic dysphonia, status post surgical correction, also had a history of hypertension, diabetes, DVT, PE following surgical procedure, most recent was right knee replacement in March 2016, is taking anticoagulation pradaxa, chronic low back pain, previous history of epidural injection for low back pain.  She fell from law mower in June 2016, landed on her back, she was able to got up, finished mowing her lawn, later that day, she noticed worsening low back pain, significant neck pain, radiating pain to her bilateral shoulder, upper extremity, hands, she also noticed bilateral hands paresthesia, burning sensation, subjective weakness, she had worsening urinary urgency, wear pads occasionally, mild unbalanced gait, mild constipation  She was evaluated by [facility] orthopedic surgeon Dr. Rolena Infante for her worsening low back pain, reported MRI of lumbar spine at [place] in July 2016, was told she is not a surgical candidate, she does has multilevel lumbar degenerative disc disease.  She is also concerned about her bilateral hands tremor, getting worse since 2015, most noticeable when she writes, holding utensils, there was no similar family history  UPDATE August 24th 2016: She continues to complain bilateral neck pain, radiating pain to bilateral shoulder, arm, We have reviewed MRI cervical spine (without) demonstrating: 1. At C3-4: disc bulging and uncovertebral joint hypertrophy  and facet hypertrophy with severe left foraminal stenosis 2. At C4-5: disc bulging and uncovertebral joint hypertrophy with severe biforaminal stenosis 3. At C5-6: disc bulging and uncovertebral joint hypertrophy with moderate right and severe left foraminal stenosis 4. At C6-7: disc bulging and uncovertebral joint hypertrophy with moderate biforaminal stenosis 5. No intrinsic or compressive spinal cord lesions  Propanolol 40 mg twice a day has helped her tremor, she denies significant side effect.  UPDATE June 26th 2017: She continue complains of bilateral lower extremity posturing tremor, significant neck pain, radiating pain to bilateral upper extremity, increased unsteady gait, she has fell few times, worsening bilateral lower extremity paresthesia  UPDATE Sept 1 2017: She has worsening low back pain, it gets worse with any movement, difficulty changing bed linen, she is no longer cooking,   She recently had spinal stimulator trial by [facility] by Dr. Delfino Lovett Rauck,,was deemed to be a potential candidate, she did responding well to the spinal stimulator. She complains of midline constant low back pain, radiating down to right hip, right leg and left leg, getting worse with movement, she has bladder urgency, no bowel incontinence.   She had physical therapy, last in July 2017,    She also complains of migraine, seems to get more frequent since March 2017, she is now having headaches 3-5/week, lasting for 4 hours or longer, with light noise sensitivity nauseous,  She used to get Botox injection as migraine prevention from Dr. Samuel Germany, responded very well, last injection was in 2015, responding well. She is taking Imitrex, Zofran as needed  She is on antibiotics for chronic UTI,  Update June 20 2016: She came in for Botox injection as migraine prevention, also complains worsening bilateral hands tremor, hope to received EMG guided Botox injection for her right hand  tremor.  UPDATE Sep 27 2015: She had sudden onset of left shoulder pain, neck pain, I personally reviewed x-ray of the cervical spine, evidence of multilevel degenerative disc disease most severe C5-6 C6-7, mild to moderate bilateral foraminal stenosis  She responded well to previous Botox injection in October 2017, reported 75% improvement, over past 3 months, used Imitrex injection once, 4 tablets of Imitrex 25 mg, works well,  She was not sure about the benefit of EMG guided botulism toxin injection for her right hand tremor,  UPDATE December 19 2016: She complains of frequent headaches since December 16 2016, bilateral occipital region, pressure, pounding, nauseous, light sensitivity, she still complains 7 out of 10 headaches, despite multiple dose of Imitrex 25 mg tablets, and Imitrex injection, she has used up all her supplies, she also noticed increased bilateral hands tremor, feeling electronic sensation underneath her skin, to her shoulder down to her arms and legs, it is out of her control, but she denied anxiety or depression,  She has self adjusted her medication recently, she stopped the Wellbutrin by herself for a while, started about 3 weeks ago, also has run out of propanolol,  Husband also reported that she spent most of time in her bed, not feeling well.  UPDATE January 02 2017: Her headache overall is under good control, return for Botox injection as migraine prevention, also extra injection for her right hand tremor, writer's cramp  UPDATE Sept 19 2018: She responded well to previous injection for migraine prevention but did not help her essential tremor much,  UPDATE Jun 25 2018: Her husband has stroke in Dec 2018, she has gone through significant emotional and financial stress, suffered a significant depression, attempted suicidal overdose on Percocet in March 2019, she also presented with significant GI symptoms, nausea vomiting abdominal pain, was treated with IV fluid, diagnosed  with pancreatitis, was restarted on her antidepression Wellbutrin, trazodone, she is now discharged home, still dealing with significant depression, complains of intermittent diarrhea, stomach pain, lack of energy, She continue have frequent headaches, previous Botox works well for her migraine headaches, wants to continue Botox injection.  I also reviewed her geneSight psychotropic pharmacogenomic test result, that is most related to antipsychotics, antidepression use  UPDATE Jul 17 2018: She continues to have significant headaches, came in for Botox injection,   REVIEW OF SYSTEMS: Full 14 system review of systems performed and notable only for above   ALLERGIES: Allergies  Allergen Reactions  . Bee Venom Anaphylaxis  . Tape Rash    Rash - only use paper tape  . Etodolac Diarrhea and Nausea And Vomiting  . Morphine Other (See Comments)    Hallucinations  . Other Itching and Swelling    Bee venom  . Poison Ivy Extract  [Poison Ivy Extract] Itching and Swelling  . Propoxyphene Nausea And Vomiting  . Propoxyphene N-Acetaminophen Nausea And Vomiting  . Penicillins Rash    Has patient had a PCN reaction causing immediate rash, facial/tongue/throat swelling, SOB or lightheadedness with hypotension: Yes Has patient had a PCN reaction causing severe rash involving mucus membranes or skin necrosis: No Has patient had a PCN reaction that required hospitalization No Has patient had a PCN reaction occurring within the last 10 years: No If all of the above answers are "NO", then may proceed with Cephalosporin  use.     HOME MEDICATIONS: Current Outpatient Prescriptions  Medication Sig Dispense Refill  . albuterol (PROVENTIL HFA;VENTOLIN HFA) 108 (90 BASE) MCG/ACT inhaler Inhale 1 puff into the lungs every 6 (six) hours as needed for wheezing or shortness of breath.    [name] albuterol (PROVENTIL) (2.5 MG/3ML) 0.083% nebulizer solution Take 2.5 mg by nebulization every 6 (six) hours as needed for  wheezing or shortness of breath.    . benzonatate (TESSALON) 100 MG capsule Take 100 mg by mouth 3 (three) times daily as needed for cough.    . clonazePAM (KLONOPIN) 0.5 MG tablet Take 0.5 mg by mouth at bedtime.    . cyclobenzaprine (FLEXERIL) 10 MG tablet Take 10 mg by mouth at bedtime.     [name] EPINEPHrine (EPIPEN) 0.3 mg/0.3 mL DEVI Inject 0.3 mg into the muscle once as needed (Allergic reaction).     . fluorometholone (FML) 0.1 % ophthalmic suspension Place 1 drop into both eyes daily as needed (Eye allergies).   0  . fluticasone (FLONASE) 50 MCG/ACT nasal spray Place 1 spray into both nostrils daily as needed for allergies or rhinitis.    [name] gabapentin (NEURONTIN) 800 MG tablet Take 800-1,600 mg by mouth 2 (two) times daily. 800 in the morning and 1600 at pm    . HYDROcodone-acetaminophen (NORCO/VICODIN) 5-325 MG per tablet Take one to two tablets by mouth every 4 hours as needed for pain. Do not exceed 4gm of Tylenol in 24 hours 360 tablet 0  . levothyroxine (SYNTHROID, LEVOTHROID) 25 MCG tablet Take 25 mcg by mouth every morning.     . methocarbamol (ROBAXIN) 500 MG tablet Take 1 tablet (500 mg total) by mouth every 6 (six) hours as needed for muscle spasms. 90 tablet 0  . mometasone-formoterol (DULERA) 100-5 MCG/ACT AERO Inhale 2 puffs into the lungs 2 (two) times daily.    [name] nystatin (MYCOSTATIN) 100000 UNIT/ML suspension Take 5 mLs by mouth daily as needed (Yeast).    . ondansetron (ZOFRAN) 4 MG tablet Take 4 mg by mouth every 8 (eight) hours as needed for nausea or vomiting.    . ondansetron (ZOFRAN-ODT) 8 MG disintegrating tablet Take 8 mg by mouth every 8 (eight) hours as needed for nausea or vomiting.    [name] oxyCODONE (OXY IR/ROXICODONE) 5 MG immediate release tablet Take 1-2 tablets (5-10 mg total) by mouth every 3 (three) hours as needed for moderate pain or severe pain. 90 tablet 0  . pantoprazole (PROTONIX) 40 MG tablet Take 40 mg by mouth daily.    . Phenazopyridine HCl (AZO TABS PO)  Take 1 tablet by mouth daily as needed (UTI).    . primidone (MYSOLINE) 50 MG tablet Take 50 mg by mouth at bedtime.    . promethazine (PHENERGAN) 25 MG tablet Take 25 mg by mouth every 6 (six) hours as needed for nausea or vomiting.    . Rivaroxaban (XARELTO) 15 MG TABS tablet Take 1 tablet (15 mg total) by mouth 2 (two) times daily with a meal. 60 tablet 5  . rizatriptan (MAXALT) 10 MG tablet Take 10 mg by mouth as needed for migraine (migraine). May repeat in 2 hours if needed    . sertraline (ZOLOFT) 100 MG tablet Take 100 mg by mouth daily.    . sitaGLIPtin (JANUVIA) 100 MG tablet Take 100 mg by mouth every evening.     . SUMAtriptan (IMITREX) 6 MG/0.5ML SOLN injection Inject 6 mg into the skin every 2 (two) hours as needed for  migraine or headache. May repeat in 2 hours if headache persists or recurs.    . valACYclovir (VALTREX) 1000 MG tablet Take 2,000 mg by mouth 2 (two) times daily as needed (Fever blisters).   2     PAST MEDICAL HISTORY: Past Medical History:  Diagnosis Date  . Anemia   . Anxiety   . Bronchitis    hx of   . Clotting disorder ([place]) 02-17-14   S/p appendectomy-developed Pulmonary emboli-tx. warfarin-d/c 1 yr ago.  . Colon polyps   . Depression   . Diabetes mellitus without complication ([place])    "borderline"- oral med  . Disorder of vocal cords    spasmotic dysphonia ,02-17-14 has" whispery voice-low tone"  . Hyperthyroidism   . Kidney stones   . Migraines   . Nausea Nov 15, 2014   cycle of migraine headaches and nausea  . Neuromuscular disorder ([place])    hands and throat"spasmodic dysphonia", tremors - thimb and forefinger   . Obesity, Class III, BMI 40-49.9 (morbid obesity) ([place]) 04/27/2010   02-17-14 reports some weight loss- intentional  . Osteoarthritis    in back(chronic pain)  . Pelvic pain    02-17-14 "states thinks its back related on left groin"  . Pulmonary emboli [facility])    [facility]p [facility]endectomy [facility]  . Reflux   . Right knee pain   . Sleep apnea      cpap - settings at 4   . Toxic goiter     PAST SURGICAL HISTORY: Past Surgical History:  Procedure Laterality Date  . ABDOMINAL HYSTERECTOMY    . APPENDECTOMY     3'[facility] s/p developed  Pulmonary Emboli  . BOTOX INJECTION     for migranes  . CATARACT EXTRACTION, BILATERAL Bilateral 03/2013   . CHOLECYSTECTOMY    . HAND SURGERY  2004   both hands  . KIDNEY STONE SURGERY    . KNEE ARTHROSCOPY  1998   right  . LAPAROSCOPIC APPENDECTOMY  11/27/2011   Procedure: APPENDECTOMY LAPAROSCOPIC;  Surgeon: Adin Hector, MD;  Location: [facility];  Service: General;  Laterality: N/A;  . SPINAL CORD STIMULATOR IMPLANT  06/04/2016   Dr. Alfonse Ras at [facility]  . TOTAL KNEE ARTHROPLASTY Right 12/06/2014   Procedure: RIGHT TOTAL KNEE ARTHROPLASTY;  Surgeon: Gaynelle Arabian, MD;  Location: [facility];  Service: Orthopedics;  Laterality: Right;  . TOTAL KNEE REVISION Left 02/24/2014   Procedure: LEFT TOTAL KNEE REVISION;  Surgeon: Gearlean Alf, MD;  Location: [facility];  Service: Orthopedics;  Laterality: Left;  . vocal cord surgery  02-17-14   01-12-14 done in [place] [facility](s/p selective denervation/reinnervation recurrent laryngeal nerve surgery)  . zenkers diverticulum      FAMILY HISTORY: Family History  Problem Relation Age of Onset  . Heart disease Mother   . Pneumonia Father   . Cirrhosis Father   . Hypertension Father   . Alcoholism Father   . Heart attack Paternal Grandfather   . Heart attack Maternal Grandfather   . Heart attack Paternal Grandmother   . Heart attack Maternal Grandmother     SOCIAL HISTORY:  Social History   Socioeconomic History  . Marital status: Married    Spouse name: Not on file  . Number of children: 5  . Years of education: 25  . Highest education level: Not on file  Occupational History  . Occupation: Retired  Scientific laboratory technician  . Financial resource strain: Somewhat hard  . Food insecurity:  Worry: Sometimes true    Inability: Sometimes  true  . Transportation needs:    Medical: Yes    Non-medical: Yes  Tobacco Use  . Smoking status: Former Smoker    Last attempt to quit: 01/03/1982    Years since quitting: 36.5  . Smokeless tobacco: Never Used  Substance and Sexual Activity  . Alcohol use: No  . Drug use: No  . Sexual activity: Not Currently  Lifestyle  . Physical activity:    Days per week: 0 days    Minutes per session: 0 min  . Stress: Very much  Relationships  . Social connections:    Talks on phone: Once a week    Gets together: Once a week    Attends religious service: More than 4 times per year    Active member of club or organization: No    Attends meetings of clubs or organizations: Never    Relationship status: Separated  . Intimate partner violence:    Fear of current or ex partner: No    Emotionally abused: No    Physically abused: No    Forced sexual activity: No  Other Topics Concern  . Not on file  Social History Narrative   Lives at home alone at this time.   Right-handed.   2 cups caffeine per day.     PHYSICAL EXAM   Vitals:   07/16/18 1300  BP: 110/64  Pulse: 63  Weight: 206 lb 8 oz (93.7 kg)  Height: 5\' 6"  (1.676 m)    Not recorded      Body mass index is 33.33 kg/m.  PHYSICAL EXAMNIATION:  Gen: NAD, conversant, well nourised, obese, well groomed                     Cardiovascular: Regular rate rhythm, no peripheral edema, warm, nontender. Eyes: Conjunctivae clear without exudates or hemorrhage Neck: Supple, no carotid bruise. Pulmonary: Clear to auscultation bilaterally   NEUROLOGICAL EXAM:  MENTAL STATUS: Speech:    Mild dysphonic speech; fluent and spontaneous with normal comprehension.  Cognition:     Orientation to time, place and person     Normal recent and remote memory     Normal Attention span and concentration     Normal Language, naming, repeating,spontaneous speech     Fund of knowledge   CRANIAL NERVES: CN II: Visual fields are full to  confrontation. Fundoscopic exam is normal with sharp discs and no vascular changes. Pupils are round equal and briskly reactive to light. CN III, IV, VI: extraocular movement are normal. No ptosis. CN V: Facial sensation is intact to pinprick in all 3 divisions bilaterally. Corneal responses are intact.  CN VII: Face is symmetric with normal eye closure and smile. CN VIII: Hearing is normal to rubbing fingers CN IX, X: Palate elevates symmetrically. Phonation is normal. CN XI: Head turning and shoulder shrug are intact CN XII: Tongue is midline with normal movements and no atrophy.  MOTOR: Mild bilateral hands postural tremor, normal muscle tone, bulk, strength, Excessive right hand/wrist flexion/ extension,   REFLEXES: Reflexes are 2+ and symmetric at the biceps, triceps, knees, and ankles. Plantar responses are flexor.  SENSORY: Intact to light touch, pinprick, position sense, and vibration sense are intact in fingers and toes.  COORDINATION: Rapid alternating movements and fine finger movements are intact. There is no dysmetria on finger-to-nose and heel-knee-shin.    GAIT/STANCE: Posture is normal. Gait is cautious, mildly unsteady  DIAGNOSTIC DATA (LABS,  IMAGING, TESTING) - I reviewed patient records, labs, notes, testing and imaging myself where available.   ASSESSMENT AND PLAN  Hayley [name] is a 72 y.o. female   Chronic migraine headaches  Concurrent with her worsening depression  Imitrex 25 mg as needed, limit the use to twice each week.   Botox injection for chronic migraine prevention, injection was performed according to [place] protocol,  5 units of Botox was injected into each side, for 31 injection sites, total of 155 units  Bilateral frontalis 4 injection sites Bilateral corrugate 2 injection sites Procerus 1 injection sites. Bilateral temporalis 8 injection sites Bilateral occipitalis 6 injection sites Bilateral cervical paraspinals 4 injection  sites Bilateral upper trapezius 6 injection sites  Extra 45 unites were injected into left parietal/temporal region   Marcial Pacas, M.D. Ph.D.  [facility] [address] [place], [place] [address] Ph: [phone] Fax: [phone]  CC: To Dr. Daryll Brod, Flora Lipps

## 2018-07-17 DIAGNOSIS — G43709 Chronic migraine without aura, not intractable, without status migrainosus: Secondary | ICD-10-CM

## 2018-07-17 MED ORDER — ONABOTULINUMTOXINA 100 UNITS IJ SOLR
200.0000 [IU] | Freq: Once | INTRAMUSCULAR | Status: AC
  Administered 2018-07-17: 200 [IU] via INTRAMUSCULAR

## 2018-07-22 ENCOUNTER — Ambulatory Visit: Payer: Medicare Other | Admitting: Neurology

## 2018-08-25 ENCOUNTER — Telehealth: Payer: Self-pay | Admitting: Neurology

## 2018-08-25 ENCOUNTER — Other Ambulatory Visit: Payer: Self-pay | Admitting: Neurology

## 2018-08-25 NOTE — Telephone Encounter (Signed)
Error

## 2018-08-25 NOTE — Telephone Encounter (Signed)
I have spoken with Hayley [name]. this afternoon. She sts. Dr. [name] has rx'd Clonazepam in the past for her (last rx. March 2019). She sts. her husband recently had a stroke. He was d/c from the hospital and sent home. She reports more anxiety and difficulty sleeping since then. I have spoken with Dr. [name] and advised Hayley [name]. that this is not a medication Dr. [name] rx's on a regular basis; she should f/u with her pcp and behavioral health counselor (that she already sees) and discuss anxiety, diff. sleeping.  She verbalized understanding of same/fim

## 2018-08-25 NOTE — Telephone Encounter (Signed)
Pt is asking for a call from RN to discuss why she is asking for the clonazePAM (KLONOPIN) 0.5 MG tablet to be filled again

## 2018-08-28 ENCOUNTER — Other Ambulatory Visit (HOSPITAL_COMMUNITY): Payer: Self-pay

## 2018-08-28 ENCOUNTER — Other Ambulatory Visit (HOSPITAL_COMMUNITY): Payer: Self-pay | Admitting: Psychiatry

## 2018-08-28 DIAGNOSIS — F322 Major depressive disorder, single episode, severe without psychotic features: Secondary | ICD-10-CM

## 2018-08-28 MED ORDER — VORTIOXETINE HBR 10 MG PO TABS: 10.0000 mg | ORAL_TABLET | Freq: Every day | ORAL | 0 refills | Status: DC

## 2018-09-04 ENCOUNTER — Ambulatory Visit (INDEPENDENT_AMBULATORY_CARE_PROVIDER_SITE_OTHER): Payer: Medicare Other | Admitting: Psychiatry

## 2018-09-04 ENCOUNTER — Encounter (HOSPITAL_COMMUNITY): Payer: Self-pay | Admitting: Psychiatry

## 2018-09-04 VITALS — BP 128/80 | Ht 66.0 in | Wt 210.0 lb

## 2018-09-04 DIAGNOSIS — F411 Generalized anxiety disorder: Secondary | ICD-10-CM | POA: Diagnosis not present

## 2018-09-04 DIAGNOSIS — F322 Major depressive disorder, single episode, severe without psychotic features: Secondary | ICD-10-CM

## 2018-09-04 MED ORDER — CLONAZEPAM 0.5 MG PO TABS: 0.5000 mg | ORAL_TABLET | Freq: Every day | ORAL | 0 refills | Status: DC | PRN

## 2018-09-04 MED ORDER — TRAZODONE HCL 50 MG PO TABS: 50.0000 mg | ORAL_TABLET | Freq: Every evening | ORAL | 1 refills | Status: DC | PRN

## 2018-09-04 MED ORDER — VORTIOXETINE HBR 20 MG PO TABS: 20.0000 mg | ORAL_TABLET | Freq: Every day | ORAL | 1 refills | Status: DC

## 2018-09-04 NOTE — Progress Notes (Signed)
[facility] MD/PA/NP OP Progress Note  09/04/2018 8:32 AM Hayley [name]  MRN:  [id number]  Chief Complaint: I do not think my medicine working.  I am feeling overwhelmed.  I have very anxious nervous.  HPI: Hayley [name] came for her follow-up appointment.  She was last seen in June.  She is experiencing increased anxiety, depression, crying spells and times feeling hopeless and helpless.  She admitted 2 weeks ago she had suicidal thoughts but she was able to distract these thoughts.  Her husband who is 105 years old and had multiple health issues including stroke dementia has moved in with her.  In the beginning her husband's children took him away from her when husband had a stroke last December.  Patient told her husband's children hate her and did not allow him to visit.  However her husband continue to insist that he want to live with his wife and this September they moved him back to his previous house with his wife.  Patient is taking care of her 38 year old husband alone.  Patient told her husband has multiple health issues.  She is very happy taking care of him but she is very sad and disappointed because husband back-to-back hospitalized few times and every time when he discharged from the hospital his condition physically and mentally get worse.  She feels too much burden on her shoulder.  She still like the companionship of her husband but she is not sure what to do as patient at times have memory problem and does not retain the conversation.  Patient had contact [facility] as husband has benefits from the [place].  She is hoping that we will send home health aide to help few hours so she can have her own time.  Patient admitted not able to see Amada Jupiter for therapy.  She is taking Trintellix 10 mg.  She does not feel it is working as good.  She has insomnia, anxiety, nervousness, feeling of hopelessness.  Last week she was very depressed and had passive and fleeting suicidal thoughts but no plan or any intent.  She does not  want to take her own life.  She admitted needing coping skills so she can handle the situation.  She endorsed financial issues and increased utility bills as husband is always cold and she need to have higher heat level at home.  She was able to get Medicaid finally in June and she cannot afford the medication.  Patient denies any paranoia, hallucination, aggressive behavior.  She is emotional and preoccupied with her husband's health.  Today she is having her teeth pulled out and she has to pay out-of-pocket.  She is not sure about the future how she is going to survive financially.  She is been not sleeping as good because she ran out of trazodone.  Her appetite is okay.  Her energy level is low.  Her vital signs are stable.  Visit Diagnosis:    ICD-10-CM   1. GAD (generalized anxiety disorder) F41.1 vortioxetine HBr (TRINTELLIX) 20 MG TABS tablet    clonazePAM (KLONOPIN) 0.5 MG tablet  2. MDD (major depressive episode), single episode, severe, no psychosis ([place]) F32.2 traZODone (DESYREL) 50 MG tablet    vortioxetine HBr (TRINTELLIX) 20 MG TABS tablet    Past Psychiatric History: Reviewed. History of depression and anxiety.  History of suicidal attempt on Percocet in March 2019.  History of completed IOP.  Prescribed Effexor in her 73s but stopped working after a while.  Tried Zoloft, Paxil and  Wellbutrin from primary care physician but did not work.  PHP tried Prozac but no improvement.      Past Medical History:  Past Medical History:  Diagnosis Date  . Anemia   . Anxiety   . Bronchitis    hx of   . Clotting disorder ([place]) 02-17-14   S/p appendectomy-developed Pulmonary emboli-tx. warfarin-d/c 1 yr ago.  . Colon polyps   . Depression   . Diabetes mellitus without complication ([place])    "borderline"- oral med  . Disorder of vocal cords    spasmotic dysphonia ,02-17-14 has" whispery voice-low tone"  . Hyperthyroidism   . Kidney stones   . Migraines   . Nausea Nov 15, 2014   cycle of  migraine headaches and nausea  . Neuromuscular disorder ([place])    hands and throat"spasmodic dysphonia", tremors - thimb and forefinger   . Obesity, Class III, BMI 40-49.9 (morbid obesity) ([place]) 04/27/2010   02-17-14 reports some weight loss- intentional  . Osteoarthritis    in back(chronic pain)  . Pelvic pain    02-17-14 "states thinks its back related on left groin"  . Pulmonary emboli [facility])    s/p Appendectomy '13 [facility]  . Reflux   . Right knee pain   . Sleep apnea    cpap - settings at 4   . Toxic goiter     Past Surgical History:  Procedure Laterality Date  . ABDOMINAL HYSTERECTOMY    . APPENDECTOMY     3'13 [facility] s/p developed  Pulmonary Emboli  . BOTOX INJECTION     for migranes  . CATARACT EXTRACTION, BILATERAL Bilateral 03/2013   . CHOLECYSTECTOMY    . HAND SURGERY  2004   both hands  . KIDNEY STONE SURGERY    . KNEE ARTHROSCOPY  1998   right  . LAPAROSCOPIC APPENDECTOMY  11/27/2011   Procedure: APPENDECTOMY LAPAROSCOPIC;  Surgeon: Adin Hector, MD;  Location: [facility]S;  Service: General;  Laterality: N/A;  . SPINAL CORD STIMULATOR IMPLANT  06/04/2016   Dr[facility] Alfonse Ras at [facility]  . TOTAL KNEE ARTHROPLASTY Right 12/06/2014   Procedure: RIGHT TOTAL KNEE ARTHROPLASTY;  Surgeon: Gaynelle Arabian, MD;  Location: [facility];  Service: Orthopedics;  Laterality: Right;  . TOTAL KNEE REVISION Left 02/24/2014   Procedure: LEFT TOTAL KNEE REVISION;  Surgeon: Gearlean Alf, MD;  Location: [facility];  Service: Orthopedics;  Laterality: Left;  . vocal cord surgery  02-17-14   01-12-14 done in [place] [facility](s/p selective denervation/reinnervation recurrent laryngeal nerve surgery)  . zenkers diverticulum      Family Psychiatric History: Reviewed.  Family History:  Family History  Problem Relation Age of Onset  . Heart disease Mother   . Pneumonia Father   . Cirrhosis Father   . Hypertension Father   . Alcoholism Father   . Heart attack Paternal Grandfather   . Heart  attack Maternal Grandfather   . Heart attack Paternal Grandmother   . Heart attack Maternal Grandmother     Social History:  Social History   Socioeconomic History  . Marital status: Married    Spouse name: Not on file  . Number of children: 5  . Years of education: 68  . Highest education level: Not on file  Occupational History  . Occupation: Retired  Scientific laboratory technician  . Financial resource strain: Somewhat hard  . Food insecurity:    Worry: Sometimes true    Inability: Sometimes true  . Transportation needs:    Medical:  Yes    Non-medical: Yes  Tobacco Use  . Smoking status: Former Smoker    Last attempt to quit: 01/03/1982    Years since quitting: 36.6  . Smokeless tobacco: Never Used  Substance and Sexual Activity  . Alcohol use: No  . Drug use: No  . Sexual activity: Not Currently  Lifestyle  . Physical activity:    Days per week: 0 days    Minutes per session: 0 min  . Stress: Very much  Relationships  . Social connections:    Talks on phone: Once a week    Gets together: Once a week    Attends religious service: More than 4 times per year    Active member of club or organization: No    Attends meetings of clubs or organizations: Never    Relationship status: Separated  Other Topics Concern  . Not on file  Social History Narrative   Lives at home alone at this time.   Right-handed.   2 cups caffeine per day.    Allergies:  Allergies  Allergen Reactions  . Bee Venom Anaphylaxis  . Tape Rash    Rash - only use paper tape  . Etodolac Diarrhea and Nausea And Vomiting  . Morphine Other (See Comments)    Hallucinations  . Other Itching and Swelling    Bee venom  . Poison Ivy Extract  [Poison Ivy Extract] Itching and Swelling  . Propoxyphene Nausea And Vomiting  . Propoxyphene N-Acetaminophen Nausea And Vomiting  . Penicillins Rash    Has patient had a PCN reaction causing immediate rash, facial/tongue/throat swelling, SOB or lightheadedness with  hypotension: Yes Has patient had a PCN reaction causing severe rash involving mucus membranes or skin necrosis: No Has patient had a PCN reaction that required hospitalization No Has patient had a PCN reaction occurring within the last 10 years: No If all of the above answers are "NO", then may proceed with Cephalosporin use.     Metabolic Disorder Labs: Lab Results  Component Value Date   HGBA1C 9.4 (H) 12/19/2016   MPG 157 03/23/2016   MPG 154 06/04/2015   No results found for: PROLACTIN Lab Results  Component Value Date   CHOL 159 03/23/2016   TRIG 122 11/17/2017   HDL 31 (L) 03/23/2016   CHOLHDL 5.1 03/23/2016   VLDL 35 03/23/2016   LDLCALC 93 03/23/2016   Lab Results  Component Value Date   TSH 1.348 11/17/2017   TSH 1.140 12/19/2016    Therapeutic Level Labs: No results found for: LITHIUM No results found for: VALPROATE No components found for:  CBMZ  Current Medications: Current Outpatient Medications  Medication Sig Dispense Refill  . albuterol (PROVENTIL HFA;VENTOLIN HFA) 108 (90 BASE) MCG/ACT inhaler Inhale 1 puff into the lungs every 6 (six) hours as needed for wheezing or shortness of breath.    [name] albuterol (PROVENTIL) (2.5 MG/3ML) 0.083% nebulizer solution Take 2.5 mg by nebulization every 6 (six) hours as needed for wheezing or shortness of breath. Reported on 01/10/2016    . apixaban (ELIQUIS) 2.5 MG TABS tablet Take 1 tablet (2.5 mg total) by mouth 2 (two) times daily. 180 tablet 3  . benzonatate (TESSALON) 200 MG capsule Take 200 mg by mouth 3 (three) times daily as needed for cough.    . cyanocobalamin (,VITAMIN B-12,) 1000 MCG/ML injection Inject 1 mL into the skin every 30 (thirty) days.  11  . diphenhydrAMINE (BENADRYL) 25 MG tablet Take 50 mg by mouth every  6 (six) hours as needed for itching.    [name] glipiZIDE (GLUCOTROL XL) 5 MG 24 hr tablet Take 5 mg by mouth daily with breakfast.    . hyoscyamine (ANASPAZ) 0.125 MG TBDP disintergrating tablet Place  0.125 mg under the tongue every 4 (four) hours as needed.    [name] levothyroxine (SYNTHROID, LEVOTHROID) 25 MCG tablet Take 25 mcg by mouth every morning.     . mometasone-formoterol (DULERA) 100-5 MCG/ACT AERO Inhale 2 puffs into the lungs 2 (two) times daily.    . OnabotulinumtoxinA (BOTOX IM) Inject 200 Units into the muscle every 3 (three) months.    . ondansetron (ZOFRAN-ODT) 8 MG disintegrating tablet TAKE 1 TABLET BY MOUTH EVERY DAY AS NEEDED FOR NAUSEA  5  . propranolol (INDERAL) 60 MG tablet Take 1 tablet (60 mg total) by mouth 2 (two) times daily. 60 tablet 0  . traZODone (DESYREL) 100 MG tablet Take 1 tablet (100 mg total) by mouth at bedtime as needed for sleep. 90 tablet 0  . valACYclovir (VALTREX) 1000 MG tablet Take 2,000 mg by mouth 2 (two) times daily as needed (Fever blisters). Reported on 01/10/2016  2  . vortioxetine HBr (TRINTELLIX) 10 MG TABS tablet Take 1 tablet (10 mg total) by mouth daily. 30 tablet 0   No current facility-administered medications for this visit.      Musculoskeletal: Strength & Muscle Tone: within normal limits Gait & Station: normal Patient leans: N/A  Psychiatric Specialty Exam: Review of Systems  Skin: Negative.   Psychiatric/Behavioral: Positive for depression. Negative for substance abuse and suicidal ideas. The patient is nervous/anxious and has insomnia.     Last menstrual period 09/18/1979.There is no height or weight on file to calculate BMI.  General Appearance: Well Groomed  Eye Contact:  Good  Speech:  Clear and Coherent  Volume:  Normal  Mood:  Anxious, Dysphoric, Hopeless and emotional  Affect:  Constricted and Depressed  Thought Process:  Descriptions of Associations: Intact  Orientation:  Full (Time, Place, and Person)  Thought Content: Rumination   Suicidal Thoughts:  No  Homicidal Thoughts:  No  Memory:  Immediate;   Good Recent;   Good Remote;   Good  Judgement:  Good  Insight:  Good  Psychomotor Activity:  Normal   Concentration:  Concentration: Fair and Attention Span: Fair  Recall:  Good  Fund of Knowledge: Good  Language: Good  Akathisia:  No  Handed:  Right  AIMS (if indicated): not done  Assets:  Communication Skills Desire for Improvement Housing Resilience  ADL's:  Intact  Cognition: WNL  Sleep:  Fair   Screenings:   Assessment and Plan: Major depressive disorder, recurrent.  Generalized anxiety disorder.  I had a long discussion about patient's condition related to her husband's caretaker.  She is feeling overwhelmed because husband is not doing well.  We talked about husband's long-term prognosis given the fact that he is 73 year old with multiple health issues including stroke and dementia.  I do believe she should restart therapy with Amada Jupiter.  I also offer PHP/IOP which she has done in the past with good response.  Patient agreed that she will think about it and let us know if she needs the program.  We talked about safety concern and patient wants to live and just needs some coping skills.  Recommended to try Trintellix 20 mg and ice provide samples to see if she can tolerate as patient has difficulty tolerating antidepressant in the past.  I also  suggested she should resume trazodone 50 mg as needed for insomnia.  Patient recall that she has taken Klonopin when she was in the hospital with good response to help with anxiety.  We discussed benzodiazepine dependence tolerance and withdrawal.  I will recommend to take Klonopin half to 1 tablet as needed for severe anxiety if needed.  Recommended to call us back if she has any question or any concern.  Follow-up in 6 weeks to 2 months unless patient does not feel any improvement with the adjustment of the medication and therapy. Time spent 25 minutes.  More than 50% of the time spent in psychoeducation, counseling and coordination of care.  Discuss safety plan that anytime having active suicidal thoughts or homicidal thoughts then patient  need to call 911 or go to the local emergency room.     [name], MD 09/04/2018, 8:32 AM

## 2018-09-18 DIAGNOSIS — N3946 Mixed incontinence: Secondary | ICD-10-CM | POA: Diagnosis not present

## 2018-09-22 DIAGNOSIS — N3946 Mixed incontinence: Secondary | ICD-10-CM | POA: Diagnosis not present

## 2018-10-15 ENCOUNTER — Telehealth: Payer: Self-pay | Admitting: *Deleted

## 2018-10-15 ENCOUNTER — Ambulatory Visit: Payer: Self-pay | Admitting: Neurology

## 2018-10-15 NOTE — Telephone Encounter (Signed)
No showed Botox appointment. 

## 2018-10-16 ENCOUNTER — Telehealth: Payer: Self-pay | Admitting: Neurology

## 2018-10-16 NOTE — Telephone Encounter (Signed)
I returned the patient's call and rescheduled her for 11/12/2018 at 1:30pm (arrival time 1pm).

## 2018-10-16 NOTE — Telephone Encounter (Signed)
Pt is calling to r/s her Botox appt. She wrote down the wrong date and therefore this is why she missed her last appt.

## 2018-10-16 NOTE — Telephone Encounter (Signed)
Noted, thank you so much.

## 2018-10-16 NOTE — Telephone Encounter (Signed)
What should I offer her?

## 2018-10-18 ENCOUNTER — Telehealth (HOSPITAL_COMMUNITY): Payer: Self-pay

## 2018-10-18 NOTE — Telephone Encounter (Signed)
She can try Lexapro started 5 mg daily for 1 week and then 10 mg.  Discontinue Trintellix.  Call us back if symptoms do not improve.

## 2018-10-18 NOTE — Telephone Encounter (Signed)
Patient states since starting Trintellix she has noticed her tremors have gotten really bad, she is unable to eat with silverware and it is hard for her to talk. She states that it seems to be heiping with her depression, however she said it is not worth the tremors. Please review and advise, thank you

## 2018-10-22 MED ORDER — ESCITALOPRAM OXALATE 10 MG PO TABS: ORAL_TABLET | ORAL | 0 refills | Status: DC

## 2018-10-22 NOTE — Telephone Encounter (Signed)
I called the patient and left a voicemail letting her know to stop the Trintellix and start the Lexapro. Instructions were given and I left my number to call with any questions

## 2018-10-24 ENCOUNTER — Other Ambulatory Visit (HOSPITAL_COMMUNITY): Payer: Self-pay | Admitting: Psychiatry

## 2018-11-05 ENCOUNTER — Ambulatory Visit (HOSPITAL_COMMUNITY): Payer: Medicare Other | Admitting: Psychiatry

## 2018-11-06 ENCOUNTER — Ambulatory Visit (INDEPENDENT_AMBULATORY_CARE_PROVIDER_SITE_OTHER): Payer: Medicare Other | Admitting: Psychiatry

## 2018-11-06 ENCOUNTER — Encounter (HOSPITAL_COMMUNITY): Payer: Self-pay | Admitting: Psychiatry

## 2018-11-06 VITALS — BP 115/74 | HR 60 | Ht 66.0 in | Wt 205.0 lb

## 2018-11-06 DIAGNOSIS — F322 Major depressive disorder, single episode, severe without psychotic features: Secondary | ICD-10-CM

## 2018-11-06 DIAGNOSIS — F411 Generalized anxiety disorder: Secondary | ICD-10-CM | POA: Diagnosis not present

## 2018-11-06 MED ORDER — CLONAZEPAM 0.5 MG PO TABS: 0.5000 mg | ORAL_TABLET | Freq: Every day | ORAL | 0 refills | Status: DC | PRN

## 2018-11-06 MED ORDER — TRAZODONE HCL 50 MG PO TABS: 50.0000 mg | ORAL_TABLET | Freq: Every evening | ORAL | 0 refills | Status: DC | PRN

## 2018-11-06 MED ORDER — VORTIOXETINE HBR 20 MG PO TABS: 20.0000 mg | ORAL_TABLET | Freq: Every day | ORAL | 0 refills | Status: DC

## 2018-11-06 NOTE — Progress Notes (Signed)
[facility] MD/PA/NP OP Progress Note  11/06/2018 10:06 AM Hayley [name]  MRN:  [id number]  Chief Complaint:  I am taking Trintellix because I cannot tolerate Lexapro.  It gives me nausea and I was throwing up.  HPI: Hayley [name] came for her appointment.  On her last visit we recommended increase Trintellix to 20 mg but she noticed her tremors got worse.  She has essential tremors but she noticed increasing Trintellix make it worse.  We recommended to try Lexapro but she could not tolerate the side effects.  She is having nausea, vomiting and throw up and she need to go back on Trintellix.  She admitted Trintellix help her anxiety and depression and though she still have tremors but she does not want to stop.[name]  Her husband is now in nursing home since January because he was keep having falls.  Patient is struggle with finances.  Feel very nervous and anxious about the future.  She is paying the bills but afraid that her house may go for foreclosure.  She also noticed lately having forgetfulness, cognitive impairment, with poor attention and concentration.  She has not resume therapy with Am[facility] Jupiter as she is going regularly to see her husband.  She like to enjoy time with her husband who has multiple health issues.  She feels that if she resume therapy she will be more stressed and emotional.  She is getting Botox injection from Dr. [name] for headaches.  She denies any paranoia, hallucination and realizes that she need to live for her husband.  She has no suicidal thoughts.  Her energy level is still fair and low.  She has chronic pain.  Her appetite is okay.   Visit Diagnosis:    ICD-10-CM   1. MDD (major depressive episode), single episode, severe, no psychosis ([name]) F32.2 traZODone (DESYREL) 50 MG tablet    vortioxetine HBr (TRINTELLIX) 20 MG TABS tablet  2. GAD (generalized anxiety disorder) F41.1 clonazePAM (KLONOPIN) 0.5 MG tablet    vortioxetine HBr (TRINTELLIX) 20 MG TABS tablet    Past Psychiatric History:  Reviewed. H/O depression and anxiety.  H/O suicidal attempt on Percocet in March 2019.  Took Effexor in her 75s but stopped after working for a while.  Tried Zoloft, Paxil and Wellbutrin from PCP but did not work. Did IOP, PHP and given Prozac but no improvement. Tried lexapro caused GI s/e.   Past Medical History:  Past Medical History:  Diagnosis Date  . Anemia   . Anxiety   . Bronchitis    hx of   . Clotting disorder ([place]) 02-17-14   S/p appendectomy-developed Pulmonary emboli-tx. warfarin-d/c 1 yr ago.  . Colon polyps   . Depression   . Diabetes mellitus without complication ([place])    "borderline"- oral med  . Disorder of vocal cords    spasmotic dysphonia ,02-17-14 has" whispery voice-low tone"  . Hyperthyroidism   . Kidney stones   . Migraines   . Nausea Nov 15, 2014   cycle of migraine headaches and nausea  . Neuromuscular disorder ([place])    hands and throat"spasmodic dysphonia", tremors - thimb and forefinger   . Obesity, Class III, BMI 40-49.9 (morbid obesity) ([place]) 04/27/2010   02-17-14 reports some weight loss- intentional  . Osteoarthritis    in back(chronic pain)  . Pelvic pain    02-17-14 "states thinks its back related on left groin"  . Pulmonary emboli [facility])    s/p Appendectomy '13 [facility]  . Reflux   . Right  knee pain   . Sleep apnea    cpap - settings at 4   . Toxic goiter     Past Surgical History:  Procedure Laterality Date  . ABDOMINAL HYSTERECTOMY    . APPENDECTOMY     3'13 [facility] s/p developed  Pulmonary Emboli  . BOTOX INJECTION     for migranes  . CATARACT EXTRACTION, BILATERAL Bilateral 03/2013   . CHOLECYSTECTOMY    . HAND SURGERY  2004   both hands  . KIDNEY STONE SURGERY    . KNEE ARTHROSCOPY  1998   right  . LAPAROSCOPIC APPENDECTOMY  11/27/2011   Procedure: APPENDECTOMY LAPAROSCOPIC;  Surgeon: Adin Hector, MD;  Location: [facility] ORS;  Service: General;  Laterality: N/A;  . SPINAL CORD STIMULATOR IMPLANT  06/04/2016   Dr. Alfonse Ras at [facility]  . TOTAL KNEE ARTHROPLASTY Right 12/06/2014   Procedure: RIGHT TOTAL KNEE ARTHROPLASTY;  Surgeon: Gaynelle Arabian, MD;  Location: [facility];  Service: Orthopedics;  Laterality: Right;  . TOTAL KNEE REVISION Left 02/24/2014   Procedure: LEFT TOTAL KNEE REVISION;  Surgeon: Gearlean Alf, MD;  Location: [facility];  Service: Orthopedics;  Laterality: Left;  . vocal cord surgery  02-17-14   01-12-14 done in [place] -[facility](s/p selective denervation/reinnervation recurrent laryngeal nerve surgery)  . zenkers diverticulum      Family Psychiatric History: Reviewed.  Family History:  Family History  Problem Relation Age of Onset  . Heart disease Mother   . Pneumonia Father   . Cirrhosis Father   . Hypertension Father   . Alcoholism Father   . Heart attack Paternal Grandfather   . Heart attack Maternal Grandfather   . Heart attack Paternal Grandmother   . Heart attack Maternal Grandmother     Social History:  Social History   Socioeconomic History  . Marital status: Married    Spouse name: Not on file  . Number of children: 5  . Years of education: 76  . Highest education level: Not on file  Occupational History  . Occupation: Retired  Scientific laboratory technician  . Financial resource strain: Somewhat hard  . Food insecurity:    Worry: Sometimes true    Inability: Sometimes true  . Transportation needs:    Medical: Yes    Non-medical: Yes  Tobacco Use  . Smoking status: Former Smoker    Last attempt to quit: 01/03/1982    Years since quitting: 36.8  . Smokeless tobacco: Never Used  Substance and Sexual Activity  . Alcohol use: No  . Drug use: No  . Sexual activity: Not Currently  Lifestyle  . Physical activity:    Days per week: 0 days    Minutes per session: 0 min  . Stress: Very much  Relationships  . Social connections:    Talks on phone: Once a week    Gets together: Once a week    Attends religious service: More than 4 times per year    Active member of club or organization:  No    Attends meetings of clubs or organizations: Never    Relationship status: Separated  Other Topics Concern  . Not on file  Social History Narrative   Lives at home alone at this time.   Right-handed.   2 cups caffeine per day.    Allergies:  Allergies  Allergen Reactions  . Bee Venom Anaphylaxis  . Tape Rash    Rash - only use paper tape  . Etodolac Diarrhea and Nausea  And Vomiting  . Morphine Other (See Comments)    Hallucinations  . Other Itching and Swelling    Bee venom  . Poison Ivy Extract  [Poison Ivy Extract] Itching and Swelling  . Propoxyphene Nausea And Vomiting  . Propoxyphene N-Acetaminophen Nausea And Vomiting  . Penicillins Rash    Has patient had a PCN reaction causing immediate rash, facial/tongue/throat swelling, SOB or lightheadedness with hypotension: Yes Has patient had a PCN reaction causing severe rash involving mucus membranes or skin necrosis: No Has patient had a PCN reaction that required hospitalization No Has patient had a PCN reaction occurring within the last 10 years: No If all of the above answers are "NO", then may proceed with Cephalosporin use.     Metabolic Disorder Labs: Lab Results  Component Value Date   HGBA1C 9.4 (H) 12/19/2016   MPG 157 03/23/2016   MPG 154 06/04/2015   No results found for: PROLACTIN Lab Results  Component Value Date   CHOL 159 03/23/2016   TRIG 122 11/17/2017   HDL 31 (L) 03/23/2016   CHOLHDL 5.1 03/23/2016   VLDL 35 03/23/2016   LDLCALC 93 03/23/2016   Lab Results  Component Value Date   TSH 1.348 11/17/2017   TSH 1.140 12/19/2016    Therapeutic Level Labs: No results found for: LITHIUM No results found for: VALPROATE No components found for:  CBMZ  Current Medications: Current Outpatient Medications  Medication Sig Dispense Refill  . albuterol (PROVENTIL HFA;VENTOLIN HFA) 108 (90 BASE) MCG/ACT inhaler Inhale 1 puff into the lungs every 6 (six) hours as needed for wheezing or shortness  of breath.    [name] albuterol (PROVENTIL) (2.5 MG/3ML) 0.083% nebulizer solution Take 2.5 mg by nebulization every 6 (six) hours as needed for wheezing or shortness of breath. Reported on 01/10/2016    . apixaban (ELIQUIS) 2.5 MG TABS tablet Take 1 tablet (2.5 mg total) by mouth 2 (two) times daily. 180 tablet 3  . clonazePAM (KLONOPIN) 0.5 MG tablet Take 1 tablet (0.5 mg total) by mouth daily as needed for anxiety. 15 tablet 0  . cyanocobalamin (,VITAMIN B-12,) 1000 MCG/ML injection Inject 1 mL into the skin every 30 (thirty) days.  11  . diphenhydrAMINE (BENADRYL) 25 MG tablet Take 50 mg by mouth every 6 (six) hours as needed for itching.    . escitalopram (LEXAPRO) 10 MG tablet Take one tablet (10 mg) daily for one week then increase to two tablets (20 mg) a day 60 tablet 0  . glipiZIDE (GLUCOTROL XL) 5 MG 24 hr tablet Take 5 mg by mouth daily with breakfast.    . hyoscyamine (ANASPAZ) 0.125 MG TBDP disintergrating tablet Place 0.125 mg under the tongue every 4 (four) hours as needed.    [name] levothyroxine (SYNTHROID, LEVOTHROID) 25 MCG tablet Take 25 mcg by mouth every morning.     . mometasone-formoterol (DULERA) 100-5 MCG/ACT AERO Inhale 2 puffs into the lungs 2 (two) times daily.    . OnabotulinumtoxinA (BOTOX IM) Inject 200 Units into the muscle every 3 (three) months.    . ondansetron (ZOFRAN-ODT) 8 MG disintegrating tablet TAKE 1 TABLET BY MOUTH EVERY DAY AS NEEDED FOR NAUSEA  5  . propranolol (INDERAL) 60 MG tablet Take 1 tablet (60 mg total) by mouth 2 (two) times daily. 60 tablet 0  . traZODone (DESYREL) 50 MG tablet Take 1 tablet (50 mg total) by mouth at bedtime as needed for sleep. 90 tablet 1  . valACYclovir (VALTREX) 1000 MG tablet Take  2,000 mg by mouth 2 (two) times daily as needed (Fever blisters). Reported on 01/10/2016  2   No current facility-administered medications for this visit.      Musculoskeletal: Strength & Muscle Tone: within normal limits Gait & Station:  normal Patient leans: N/A  Psychiatric Specialty Exam: Review of Systems  Musculoskeletal: Positive for joint pain.  Neurological: Positive for tremors.  Psychiatric/Behavioral: The patient is nervous/anxious.     Last menstrual period 09/18/1979.There is no height or weight on file to calculate BMI.  General Appearance: Fairly Groomed  Eye Contact:  Fair  Speech:  Clear and Coherent and Slow  Volume:  Decreased  Mood:  Anxious and Dysphoric  Affect:  Constricted and Depressed  Thought Process:  Descriptions of Associations: Intact  Orientation:  Full (Time, Place, and Person)  Thought Content: Rumination   Suicidal Thoughts:  No  Homicidal Thoughts:  No  Memory:  Immediate;   Fair Recent;   Fair Remote;   Good  Judgement:  Good  Insight:  Good  Psychomotor Activity:  Decreased and Tremor  Concentration:  Concentration: Fair and Attention Span: Fair  Recall:  [employer] of Knowledge: Good  Language: Good  Akathisia:  No  Handed:  Right  AIMS (if indicated): not done  Assets:  Communication Skills Desire for Improvement Resilience  ADL's:  Intact  Cognition: WNL  Sleep:  Fair   Screenings:   Assessment and Plan: Major depressive disorder, recurrent.  Generalized anxiety disorder.  Cognitive impairment and tremors.  Discussed chronic depression and anxiety.  She is comfortable with Trintellix 20 mg since it is helping some of her anxiety and depression and she has no longer suicidal thoughts.  She is concerned about her tremors and memory issues.  She is taking Klonopin 0.5 mg only if she is very anxious.  She is also taking trazodone 50 mg at bedtime and Trintellix 20 mg daily.  We discussed psychotropic medication can cause current impairment but she should see neurology to rule out any organic cause for the symptoms. I recommended she should discuss with Dr. [name] her neurologist about these issues.  She is getting Botox injection for headaches.  Patient promised that she  will discuss and I will also forward my notes to her.  We discussed to resume therapy but patient wants to spend more time with her husband.  Continue trazodone 50 mg at bedtime, Klonopin 0.5 mg as needed for severe anxiety and Trintellix 20 mg daily.  Follow-up in 3 months.  [name], MD 11/06/2018, 10:06 AM

## 2018-11-11 ENCOUNTER — Telehealth: Payer: Self-pay | Admitting: Neurology

## 2018-11-11 NOTE — Telephone Encounter (Signed)
Spoke to patient - her tremors are worse despite taking propranolol 60mg  BID.  This symptom has been evaluated in the past by Dr. [name].  She just would like to discuss a medication increase or change.  She has a pending appt on 11/12/2018.  It is difficult for her to get here recently due to her husband's declining health.  He has just been placed in a nursing home and is close to Hospice care.

## 2018-11-11 NOTE — Telephone Encounter (Signed)
Pt has a BOTOX appt on 11/12/18 but would also like to discuss her tremors. Pt was advised that this appt was only for the BOTOX. She would like a nurse to call her back. Please advise.

## 2018-11-12 ENCOUNTER — Encounter: Payer: Self-pay | Admitting: Neurology

## 2018-11-12 ENCOUNTER — Ambulatory Visit (INDEPENDENT_AMBULATORY_CARE_PROVIDER_SITE_OTHER): Payer: Medicare Other | Admitting: Neurology

## 2018-11-12 VITALS — BP 96/61 | HR 70 | Ht 66.0 in | Wt 204.5 lb

## 2018-11-12 DIAGNOSIS — R251 Tremor, unspecified: Secondary | ICD-10-CM | POA: Diagnosis not present

## 2018-11-12 DIAGNOSIS — G2589 Other specified extrapyramidal and movement disorders: Secondary | ICD-10-CM | POA: Diagnosis not present

## 2018-11-12 DIAGNOSIS — G250 Essential tremor: Secondary | ICD-10-CM | POA: Diagnosis not present

## 2018-11-12 DIAGNOSIS — G43709 Chronic migraine without aura, not intractable, without status migrainosus: Secondary | ICD-10-CM | POA: Diagnosis not present

## 2018-11-12 DIAGNOSIS — G8929 Other chronic pain: Secondary | ICD-10-CM

## 2018-11-12 MED ORDER — CLONAZEPAM 0.5 MG PO TABS: 0.5000 mg | ORAL_TABLET | Freq: Every evening | ORAL | 3 refills | Status: DC | PRN

## 2018-11-12 NOTE — Progress Notes (Signed)
**  Botox 100 units x 2 vials, NDC 6153-7943-27, Lot [id number], Exp 04/2021, office supply.//mck,rn**

## 2018-11-12 NOTE — Patient Instructions (Addendum)
Aleve as needed for migraine, may mixed it with Tylenol

## 2018-11-12 NOTE — Progress Notes (Signed)
Chief Complaint  Patient presents with  . Migraine    Botox 100 units x 2 vials - office supply  . Tremors    Tremors are worse despite taking propranolol 60mg  BID (prescribed by Dr. Adele Schilder).      PATIENT: Hayley [name] DOB: 1946-06-15  Chief Complaint  Patient presents with  . Migraine    Botox 100 units x 2 vials - office supply  . Tremors    Tremors are worse despite taking propranolol 60mg  BID (prescribed by Dr. Adele Schilder).     HISTORICAL  Hayley [name] is a 73 years old right-handed female, seen in refer by hand surgeon Dr. Daryll Brod, and her primary care PA Flora Lipps for evaluation of neck pain, radiating pain to bilateral shoulder, upper extremity  She has past medical history of spasmodic dysphonia, status post surgical correction, also had a history of hypertension, diabetes, DVT, PE following surgical procedure, most recent was right knee replacement in March 2016, is taking anticoagulation pradaxa, chronic low back pain, previous history of epidural injection for low back pain.  She fell from law mower in June 2016, landed on her back, she was able to got up, finished mowing her lawn, later that day, she noticed worsening low back pain, significant neck pain, radiating pain to her bilateral shoulder, upper extremity, hands, she also noticed bilateral hands paresthesia, burning sensation, subjective weakness, she had worsening urinary urgency, wear pads occasionally, mild unbalanced gait, mild constipation  She was evaluated by [facility] orthopedic surgeon Dr. Rolena Infante for her worsening low back pain, reported MRI of lumbar spine at [place] in July 2016, was told she is not a surgical candidate, she does has multilevel lumbar degenerative disc disease.  She is also concerned about her bilateral hands tremor, getting worse since 2015, most noticeable when she writes, holding utensils, there was no similar family history  UPDATE August 24th 2016: She continues to  complain bilateral neck pain, radiating pain to bilateral shoulder, arm, We have reviewed MRI cervical spine (without) demonstrating: 1. At C3-4: disc bulging and uncovertebral joint hypertrophy and facet hypertrophy with severe left foraminal stenosis 2. At C4-5: disc bulging and uncovertebral joint hypertrophy with severe biforaminal stenosis 3. At C5-6: disc bulging and uncovertebral joint hypertrophy with moderate right and severe left foraminal stenosis 4. At C6-7: disc bulging and uncovertebral joint hypertrophy with moderate biforaminal stenosis 5. No intrinsic or compressive spinal cord lesions  Propanolol 40 mg twice a day has helped her tremor, she denies significant side effect.  UPDATE June 26th 2017: She continue complains of bilateral lower extremity posturing tremor, significant neck pain, radiating pain to bilateral upper extremity, increased unsteady gait, she has fell few times, worsening bilateral lower extremity paresthesia  UPDATE Sept 1 2017: She has worsening low back pain, it gets worse with any movement, difficulty changing bed linen, she is no longer cooking,   She recently had spinal stimulator trial by [facility] by Dr. Delfino Lovett Rauck,,was deemed to be a potential candidate, she did responding well to the spinal stimulator. She complains of midline constant low back pain, radiating down to right hip, right leg and left leg, getting worse with movement, she has bladder urgency, no bowel incontinence.   She had physical therapy, last in July 2017,    She also complains of migraine, seems to get more frequent since March 2017, she is now having headaches 3-5/week, lasting for 4 hours or longer, with light noise sensitivity nauseous,  She used  to get Botox injection as migraine prevention from Dr. Samuel Germany, responded very well, last injection was in 2015, responding well. She is taking Imitrex, Zofran as needed  She is on antibiotics for chronic UTI,  Update  June 20 2016: She came in for Botox injection as migraine prevention, also complains worsening bilateral hands tremor, hope to received EMG guided Botox injection for her right hand tremor.  UPDATE Sep 27 2015: She had sudden onset of left shoulder pain, neck pain, I personally reviewed x-ray of the cervical spine, evidence of multilevel degenerative disc disease most severe C5-6 C6-7, mild to moderate bilateral foraminal stenosis  She responded well to previous Botox injection in October 2017, reported 75% improvement, over past 3 months, used Imitrex injection once, 4 tablets of Imitrex 25 mg, works well,  She was not sure about the benefit of EMG guided botulism toxin injection for her right hand tremor,  UPDATE December 19 2016: She complains of frequent headaches since December 16 2016, bilateral occipital region, pressure, pounding, nauseous, light sensitivity, she still complains 7 out of 10 headaches, despite multiple dose of Imitrex 25 mg tablets, and Imitrex injection, she has used up all her supplies, she also noticed increased bilateral hands tremor, feeling electronic sensation underneath her skin, to her shoulder down to her arms and legs, it is out of her control, but she denied anxiety or depression,  She has self adjusted her medication recently, she stopped the Wellbutrin by herself for a while, started about 3 weeks ago, also has run out of propanolol,  Husband also reported that she spent most of time in her bed, not feeling well.  UPDATE January 02 2017: Her headache overall is under good control, return for Botox injection as migraine prevention, also extra injection for her right hand tremor, writer's cramp  UPDATE Sept 19 2018: She responded well to previous injection for migraine prevention but did not help her essential tremor much,  UPDATE Jun 25 2018: Her husband has stroke in Dec 2018, she has gone through significant emotional and financial stress, suffered a significant  depression, attempted suicidal overdose on Percocet in March 2019, she also presented with significant GI symptoms, nausea vomiting abdominal pain, was treated with IV fluid, diagnosed with pancreatitis, was restarted on her antidepression Wellbutrin, trazodone, she is now discharged home, still dealing with significant depression, complains of intermittent diarrhea, stomach pain, lack of energy, She continue have frequent headaches, previous Botox works well for her migraine headaches, wants to continue Botox injection.  I also reviewed her geneSight psychotropic pharmacogenomic test result, that is most related to antipsychotics, antidepression use  UPDATE Jul 17 2018: She continues to have significant headaches, came in for Botox injection,  UPDATE Nov 13 2018: She complains of significant depression today, her husband is in nursing home, suffered a heart attack in January 2020, planning on to call in hospice care,  She complains of worsening bilateral hands tremor, previously tried primidone, propanolol all significant improvement, clonazepam 0.5 mg seems to help her some, I checked it [place] controlled substance registry, there is no history of overuse,  Her migraine overall is under good control with Botox injection, she is having migraine 1-2 times each week,  She also complains of diffuse body achy pain   REVIEW OF SYSTEMS: Full 14 system review of systems performed and notable only for above   ALLERGIES: Allergies  Allergen Reactions  . Bee Venom Anaphylaxis  . Tape Rash    Rash - only use  paper tape  . Etodolac Diarrhea and Nausea And Vomiting  . Morphine Other (See Comments)    Hallucinations  . Other Itching and Swelling    Bee venom  . Poison Ivy Extract  [Poison Ivy Extract] Itching and Swelling  . Propoxyphene Nausea And Vomiting  . Propoxyphene N-Acetaminophen Nausea And Vomiting  . Penicillins Rash    Has patient had a PCN reaction causing immediate rash,  facial/tongue/throat swelling, SOB or lightheadedness with hypotension: Yes Has patient had a PCN reaction causing severe rash involving mucus membranes or skin necrosis: No Has patient had a PCN reaction that required hospitalization No Has patient had a PCN reaction occurring within the last 10 years: No If all of the above answers are "NO", then may proceed with Cephalosporin use.     HOME MEDICATIONS: Current Outpatient Prescriptions  Medication Sig Dispense Refill  . albuterol (PROVENTIL HFA;VENTOLIN HFA) 108 (90 BASE) MCG/ACT inhaler Inhale 1 puff into the lungs every 6 (six) hours as needed for wheezing or shortness of breath.    [name] albuterol (PROVENTIL) (2.5 MG/3ML) 0.083% nebulizer solution Take 2.5 mg by nebulization every 6 (six) hours as needed for wheezing or shortness of breath.    . benzonatate (TESSALON) 100 MG capsule Take 100 mg by mouth 3 (three) times daily as needed for cough.    . clonazePAM (KLONOPIN) 0.5 MG tablet Take 0.5 mg by mouth at bedtime.    . cyclobenzaprine (FLEXERIL) 10 MG tablet Take 10 mg by mouth at bedtime.     [name] EPINEPHrine (EPIPEN) 0.3 mg/0.3 mL DEVI Inject 0.3 mg into the muscle once as needed (Allergic reaction).     . fluorometholone (FML) 0.1 % ophthalmic suspension Place 1 drop into both eyes daily as needed (Eye allergies).   0  . fluticasone (FLONASE) 50 MCG/ACT nasal spray Place 1 spray into both nostrils daily as needed for allergies or rhinitis.    [name] gabapentin (NEURONTIN) 800 MG tablet Take 800-1,600 mg by mouth 2 (two) times daily. 800 in the morning and 1600 at pm    . HYDROcodone-acetaminophen (NORCO/VICODIN) 5-325 MG per tablet Take one to two tablets by mouth every 4 hours as needed for pain. Do not exceed 4gm of Tylenol in 24 hours 360 tablet 0  . levothyroxine (SYNTHROID, LEVOTHROID) 25 MCG tablet Take 25 mcg by mouth every morning.     . methocarbamol (ROBAXIN) 500 MG tablet Take 1 tablet (500 mg total) by mouth every 6 (six) hours as  needed for muscle spasms. 90 tablet 0  . mometasone-formoterol (DULERA) 100-5 MCG/ACT AERO Inhale 2 puffs into the lungs 2 (two) times daily.    [name] nystatin (MYCOSTATIN) 100000 UNIT/ML suspension Take 5 mLs by mouth daily as needed (Yeast).    . ondansetron (ZOFRAN) 4 MG tablet Take 4 mg by mouth every 8 (eight) hours as needed for nausea or vomiting.    . ondansetron (ZOFRAN-ODT) 8 MG disintegrating tablet Take 8 mg by mouth every 8 (eight) hours as needed for nausea or vomiting.    [name] oxyCODONE (OXY IR/ROXICODONE) 5 MG immediate release tablet Take 1-2 tablets (5-10 mg total) by mouth every 3 (three) hours as needed for moderate pain or severe pain. 90 tablet 0  . pantoprazole (PROTONIX) 40 MG tablet Take 40 mg by mouth daily.    . Phenazopyridine HCl (AZO TABS PO) Take 1 tablet by mouth daily as needed (UTI).    . primidone (MYSOLINE) 50 MG tablet Take 50 mg by mouth at  bedtime.    . promethazine (PHENERGAN) 25 MG tablet Take 25 mg by mouth every 6 (six) hours as needed for nausea or vomiting.    . Rivaroxaban (XARELTO) 15 MG TABS tablet Take 1 tablet (15 mg total) by mouth 2 (two) times daily with a meal. 60 tablet 5  . rizatriptan (MAXALT) 10 MG tablet Take 10 mg by mouth as needed for migraine (migraine). May repeat in 2 hours if needed    . sertraline (ZOLOFT) 100 MG tablet Take 100 mg by mouth daily.    . sitaGLIPtin (JANUVIA) 100 MG tablet Take 100 mg by mouth every evening.     . SUMAtriptan (IMITREX) 6 MG/0.5ML SOLN injection Inject 6 mg into the skin every 2 (two) hours as needed for migraine or headache. May repeat in 2 hours if headache persists or recurs.    . valACYclovir (VALTREX) 1000 MG tablet Take 2,000 mg by mouth 2 (two) times daily as needed (Fever blisters).   2     PAST MEDICAL HISTORY: Past Medical History:  Diagnosis Date  . Anemia   . Anxiety   . Bronchitis    hx of   . Clotting disorder ([place]) 02-17-14   S/p appendectomy-developed Pulmonary emboli-tx. warfarin-d/c 1  yr ago.  . Colon polyps   . Depression   . Diabetes mellitus without complication ([place])    "borderline"- oral med  . Disorder of vocal cords    spasmotic dysphonia ,02-17-14 has" whispery voice-low tone"  . Hyperthyroidism   . Kidney stones   . Migraines   . Nausea Nov 15, 2014   cycle of migraine headaches and nausea  . Neuromuscular disorder ([place])    hands and throat"spasmodic dysphonia", tremors - thimb and forefinger   . Obesity, Class III, BMI 40-49.9 (morbid obesity) ([place]) 04/27/2010   02-17-14 reports some weight loss- intentional  . Osteoarthritis    in back(chronic pain)  . Pelvic pain    02-17-14 "states thinks its back related on left groin"  . Pulmonary emboli [facility])    s/p Appendectomy '13 [facility]  . Reflux   . Right knee pain   . Sleep apnea    cpap - settings at 4   . Toxic goiter     PAST SURGICAL HISTORY: Past Surgical History:  Procedure Laterality Date  . ABDOMINAL HYSTERECTOMY    . APPENDECTOMY     3'13 [facility] s/p developed  Pulmonary Emboli  . BOTOX INJECTION     for migranes  . CATARACT EXTRACTION, BILATERAL Bilateral 03/2013   . CHOLECYSTECTOMY    . HAND SURGERY  2004   both hands  . KIDNEY STONE SURGERY    . KNEE ARTHROSCOPY  1998   right  . LAPAROSCOPIC APPENDECTOMY  11/27/2011   Procedure: APPENDECTOMY LAPAROSCOPIC;  Surgeon: Adin Hector, MD;  Location: [facility];  Service: General;  Laterality: N/A;  . SPINAL CORD STIMULATOR IMPLANT  06/04/2016   Dr. Alfonse Ras at [facility]  . TOTAL KNEE ARTHROPLASTY Right 12/06/2014   Procedure: RIGHT TOTAL KNEE ARTHROPLASTY;  Surgeon: Gaynelle Arabian, MD;  Location: [facility];  Service: Orthopedics;  Laterality: Right;  . TOTAL KNEE REVISION Left 02/24/2014   Procedure: LEFT TOTAL KNEE REVISION;  Surgeon: Gearlean Alf, MD;  Location: [facility];  Service: Orthopedics;  Laterality: Left;  . vocal cord surgery  02-17-14   01-12-14 done in [place] [facility](s/p selective denervation/reinnervation recurrent laryngeal  nerve surgery)  . zenkers diverticulum      FAMILY  HISTORY: Family History  Problem Relation Age of Onset  . Heart disease Mother   . Pneumonia Father   . Cirrhosis Father   . Hypertension Father   . Alcoholism Father   . Heart attack Paternal Grandfather   . Heart attack Maternal Grandfather   . Heart attack Paternal Grandmother   . Heart attack Maternal Grandmother     SOCIAL HISTORY:  Social History   Socioeconomic History  . Marital status: Married    Spouse name: Not on file  . Number of children: 5  . Years of education: 22  . Highest education level: Not on file  Occupational History  . Occupation: Retired  Scientific laboratory technician  . Financial resource strain: Somewhat hard  . Food insecurity:    Worry: Sometimes true    Inability: Sometimes true  . Transportation needs:    Medical: Yes    Non-medical: Yes  Tobacco Use  . Smoking status: Former Smoker    Last attempt to quit: 01/03/1982    Years since quitting: 36.8  . Smokeless tobacco: Never Used  Substance and Sexual Activity  . Alcohol use: No  . Drug use: No  . Sexual activity: Not Currently  Lifestyle  . Physical activity:    Days per week: 0 days    Minutes per session: 0 min  . Stress: Very much  Relationships  . Social connections:    Talks on phone: Once a week    Gets together: Once a week    Attends religious service: More than 4 times per year    Active member of club or organization: No    Attends meetings of clubs or organizations: Never    Relationship status: Separated  . Intimate partner violence:    Fear of current or ex partner: No    Emotionally abused: No    Physically abused: No    Forced sexual activity: No  Other Topics Concern  . Not on file  Social History Narrative   Lives at home alone at this time.   Right-handed.   2 cups caffeine per day.     PHYSICAL EXAM   Vitals:   11/12/18 1328  BP: 96/61  Pulse: 70  Weight: 204 lb 8 oz (92.8 kg)  Height: 5\' 6"  (1.676 m)      Not recorded      Body mass index is 33.01 kg/m.  PHYSICAL EXAMNIATION:  Gen: NAD, conversant, well nourised, obese, well groomed                     Cardiovascular: Regular rate rhythm, no peripheral edema, warm, nontender. Eyes: Conjunctivae clear without exudates or hemorrhage Neck: Supple, no carotid bruise. Pulmonary: Clear to auscultation bilaterally   NEUROLOGICAL EXAM:  MENTAL STATUS: Speech:    Mild dysphonic speech; fluent and spontaneous with normal comprehension.  Cognition:     Orientation to time, place and person     Normal recent and remote memory     Normal Attention span and concentration     Normal Language, naming, repeating,spontaneous speech     Fund of knowledge   CRANIAL NERVES: CN II: Visual fields are full to confrontation.  Pupils are round equal and briskly reactive to light. CN III, IV, VI: extraocular movement are normal. No ptosis. CN V: Facial sensation is intact to pinprick in all 3 divisions bilaterally. Corneal responses are intact.  CN VII: Face is symmetric with normal eye closure and smile. CN VIII: Hearing is  normal to rubbing fingers CN IX, X: Palate elevates symmetrically. Phonation is normal. CN XI: Head turning and shoulder shrug are intact CN XII: Tongue is midline with normal movements and no atrophy.  MOTOR: Mild bilateral hands postural tremor, normal muscle tone, bulk, strength, Excessive right hand/wrist flexion/ extension,   REFLEXES: Reflexes are 2+ and symmetric at the biceps, triceps, knees, and ankles. Plantar responses are flexor.  SENSORY: Intact to light touch, pinprick, position sense, and vibration sense are intact in fingers and toes.  COORDINATION: Rapid alternating movements and fine finger movements are intact. There is no dysmetria on finger-to-nose and heel-knee-shin.    GAIT/STANCE: Posture is normal. Gait is cautious, mildly unsteady  DIAGNOSTIC DATA (LABS, IMAGING, TESTING) - I reviewed  patient records, labs, notes, testing and imaging myself where available.   ASSESSMENT AND PLAN  Hayley [name] is a 73 y.o. female   Chronic migraine headaches  Concurrent with her worsening depression  Zofran, NSAIDs as needed.   Botox injection for chronic migraine prevention, injection was performed according to [place] protocol,  5 units of Botox was injected into each side, for 31 injection sites, total of 155 units  Bilateral frontalis 4 injection sites Bilateral corrugate 2 injection sites Procerus 1 injection sites. Bilateral temporalis 8 injection sites Bilateral occipitalis 6 injection sites Bilateral cervical paraspinals 4 injection sites Bilateral upper trapezius 6 injection sites  Essential tremor  Worsening symptoms concurrent with her stress, depression,  Clonazepam 0.5 mg 30 tablets in 1 month, with 3 refills,  Extra 45 unites of Botox a was injected under EMG guidance to right upper extremity muscles in attempt to help her right hand essential tremor,  Right pronator teres 10 units  Right flexor carpi ulnaris 10 units  Right flexor digitorum profundus 5 units  Right flexor digitorum superficialis 5 units  Right pronator quadratus 5 units  Right extensor digitorum communis 5 units   Diffuse body achy pain  Will refer her to pain management  Marcial Pacas, M.D. Ph.D.  [facility] [address] [place], [place] [address] Ph: [phone] Fax: [phone]  CC: To Dr. Daryll Brod, Flora Lipps

## 2018-11-14 DIAGNOSIS — M5416 Radiculopathy, lumbar region: Secondary | ICD-10-CM | POA: Diagnosis not present

## 2018-11-14 DIAGNOSIS — M48061 Spinal stenosis, lumbar region without neurogenic claudication: Secondary | ICD-10-CM | POA: Diagnosis not present

## 2018-11-14 DIAGNOSIS — G250 Essential tremor: Secondary | ICD-10-CM | POA: Diagnosis not present

## 2018-11-14 DIAGNOSIS — G43709 Chronic migraine without aura, not intractable, without status migrainosus: Secondary | ICD-10-CM | POA: Diagnosis not present

## 2018-11-14 DIAGNOSIS — R52 Pain, unspecified: Secondary | ICD-10-CM | POA: Diagnosis not present

## 2018-11-14 MED ORDER — ONABOTULINUMTOXINA 100 UNITS IJ SOLR
200.0000 [IU] | Freq: Once | INTRAMUSCULAR | Status: AC
  Administered 2018-11-14: 200 [IU] via INTRAMUSCULAR

## 2018-11-22 DIAGNOSIS — M5416 Radiculopathy, lumbar region: Secondary | ICD-10-CM | POA: Diagnosis not present

## 2018-11-25 ENCOUNTER — Telehealth: Payer: Self-pay | Admitting: Neurology

## 2018-11-25 NOTE — Telephone Encounter (Signed)
Called Hayley [name] Back and spoke to her about referral and relayed . Patient referral was for Pain Mgt only . Patient recieves her Botox injection here at [facility]. [name] Understood and Will get patient scheduled. Thanks Hinton Dyer .

## 2018-11-25 NOTE — Telephone Encounter (Signed)
Hayley [name] from [facility] called, they did receive the referral from Dr. [name], but was needing some clarification on what it's for. Is it just for the pt's general pain situation, or are we sending pt there for her botox as well? She can be reached at [phone]

## 2018-12-02 ENCOUNTER — Telehealth: Payer: Self-pay | Admitting: Neurology

## 2018-12-02 NOTE — Telephone Encounter (Signed)
Per Dr. [name], she will be glad to refer her to [facility] to see if she will qualify for the study.    I returned the call to the patient.  She said she would rather wait on this referral for now.  She will let us know if she changes her mind.

## 2018-12-02 NOTE — Telephone Encounter (Signed)
Pt is wanting to discuss the essential tremors research study at [facility]. Have to be invited to participate. She said botox didn't help with the tremors, the index finger and thumb. She is wanting to be referred to this study.

## 2019-01-14 ENCOUNTER — Ambulatory Visit: Payer: Medicare Other | Admitting: Neurology

## 2019-02-04 ENCOUNTER — Encounter (HOSPITAL_COMMUNITY): Payer: Self-pay | Admitting: Psychiatry

## 2019-02-04 ENCOUNTER — Other Ambulatory Visit: Payer: Self-pay

## 2019-02-04 ENCOUNTER — Ambulatory Visit (INDEPENDENT_AMBULATORY_CARE_PROVIDER_SITE_OTHER): Payer: Medicare Other | Admitting: Psychiatry

## 2019-02-04 DIAGNOSIS — F322 Major depressive disorder, single episode, severe without psychotic features: Secondary | ICD-10-CM

## 2019-02-04 DIAGNOSIS — F4321 Adjustment disorder with depressed mood: Secondary | ICD-10-CM | POA: Diagnosis not present

## 2019-02-04 DIAGNOSIS — F411 Generalized anxiety disorder: Secondary | ICD-10-CM

## 2019-02-04 MED ORDER — VORTIOXETINE HBR 20 MG PO TABS: 20.0000 mg | ORAL_TABLET | Freq: Every day | ORAL | 0 refills | Status: DC

## 2019-02-04 MED ORDER — TRAZODONE HCL 100 MG PO TABS: ORAL_TABLET | ORAL | 0 refills | Status: DC

## 2019-02-04 NOTE — Progress Notes (Signed)
Virtual Visit via Telephone Note  I connected with Hayley [name] on 02/04/19 at 10:20 AM EDT by telephone and verified that I am speaking with the correct person using two identifiers.   I discussed the limitations, risks, security and privacy concerns of performing an evaluation and management service by telephone and the availability of in person appointments. I also discussed with the patient that there may be a patient responsible charge related to this service. The patient expressed understanding and agreed to proceed.   History of Present Illness: Patient was evaluated by phone session.  She reported that lately she has been sad and going through grief because her husband died on Jan 09, 2023 in nursing home.  Patient told her husband has a lot of health issues but she was relieved to visit and see him despite restriction from COVID-19 situation.  She admitted sometimes crying spells and poor sleep.  She is sleeping 4 to 5 hours.  She endorsed financial issues and not able to see her therapist Am[facility] Jupiter due to insurance not covering the bills.  She lives by herself.  Her daughter is out of town.  She tried to talk to her daughter on a regular basis.  However due to current situation she cannot visit her.  Recently she seen neurology for her tremors.  She admitted lately tremors started to get worse and now she is taking clonazepam 0.5 mg every day.  Recently her clonazepam prescription was filled by her neurology.  She has enough refills for another 3 months.  She is also getting Botox injection.  She endorsed her energy level is low but denies any suicidal thoughts or homicidal thought.  She is hoping once insurance issue resolves she may go back to [facility] for therapy.  Her appetite is fair.  She reported her weight is stable.  She has limited support system consist of neighbors and friends but due to current COVID-19 she has been unable to physically visit them.   Past Psychiatric History:  Reviewed. H/O depression and anxiety.H/O suicidal attempt on Percocet in March 2019. Took Effexor in her 49s but stopped after working for a while.  Tried Zoloft, Paxil and Wellbutrin from PCP but did not work. Did IOP, PHPand given Prozac butnoimprovement. Tried lexapro caused GI s/e.    Psychiatric Specialty Exam: Physical Exam  ROS  Last menstrual period 09/18/1979.There is no height or weight on file to calculate BMI.  General Appearance: NA  Eye Contact:  NA  Speech:  Slow and dysphonia  Volume:  Decreased  Mood:  Anxious and Depressed  Affect:  NA  Thought Process:  Goal Directed  Orientation:  Full (Time, Place, and Person)  Thought Content:  Rumination  Suicidal Thoughts:  No  Homicidal Thoughts:  No  Memory:  Immediate;   Fair Recent;   Fair Remote;   Fair  Judgement:  Good  Insight:  Fair  Psychomotor Activity:  NA  Concentration:  Concentration: Fair and Attention Span: Fair  Recall:  [employer] of Knowledge:  Fair  Language:  Good  Akathisia:  NA  Handed:  Right  AIMS (if indicated):     Assets:  Communication Skills Desire for Improvement Housing  ADL's:  Intact  Cognition:  Impaired,  Mild  Sleep:       Assessment and Plan: Major depressive disorder, recurrent.  Generalized anxiety disorder.  Cognitive impairment and tremors.  Grief.  I reviewed notes from neurology and current medication.  Patient is going  through grief since recently lost her husband in a nursing home.  However she realized the current situation related to COVID-19 and trying to do social distancing.  Patient told she was offered for grief counseling from hospice but at this time she does not want the services.  She is hoping to resume her therapy once insurance issue resolved.  She is very reluctant to try any other psychotropic medication because she concerns about tremors.  We discussed trying increasing the dose of trazodone to help her sleep and she agree.  I recommend if she  noticed increasing the dose of trazodone cause worsening of tremors or dizziness then she should go back to 50 mg only.  She like to continue Trintellix.  We have tried higher dose but her tremor started to get worse.  I discussed safety concern that anytime having active suicidal thoughts or homicidal thought that she need to call 911 or go to local emergency room.  Follow-up in 2 months.  She has enough Klonopin refill prescribed recently by her neurology.  Follow Up Instructions:    I discussed the assessment and treatment plan with the patient. The patient was provided an opportunity to ask questions and all were answered. The patient agreed with the plan and demonstrated an understanding of the instructions.   The patient was advised to call back or seek an in-person evaluation if the symptoms worsen or if the condition fails to improve as anticipated.  I provided 20 minutes of non-face-to-face time during this encounter.   [name], MD

## 2019-02-11 ENCOUNTER — Telehealth: Payer: Self-pay | Admitting: *Deleted

## 2019-02-11 ENCOUNTER — Ambulatory Visit: Payer: Medicare Other | Admitting: Neurology

## 2019-02-11 NOTE — Telephone Encounter (Signed)
No showed Botox appointment. 

## 2019-02-18 DIAGNOSIS — E538 Deficiency of other specified B group vitamins: Secondary | ICD-10-CM | POA: Diagnosis not present

## 2019-02-18 DIAGNOSIS — E119 Type 2 diabetes mellitus without complications: Secondary | ICD-10-CM | POA: Diagnosis not present

## 2019-02-18 DIAGNOSIS — F331 Major depressive disorder, recurrent, moderate: Secondary | ICD-10-CM | POA: Diagnosis not present

## 2019-02-18 DIAGNOSIS — E039 Hypothyroidism, unspecified: Secondary | ICD-10-CM | POA: Diagnosis not present

## 2019-03-31 ENCOUNTER — Emergency Department (HOSPITAL_COMMUNITY): Payer: Medicare Other

## 2019-03-31 ENCOUNTER — Encounter (HOSPITAL_COMMUNITY): Payer: Self-pay

## 2019-03-31 ENCOUNTER — Other Ambulatory Visit: Payer: Self-pay

## 2019-03-31 ENCOUNTER — Inpatient Hospital Stay (HOSPITAL_COMMUNITY)
Admission: EM | Admit: 2019-03-31 | Discharge: 2019-04-05 | DRG: 189 | Disposition: A | Discharge status: Home Health | Payer: Medicare Other | Attending: Internal Medicine | Admitting: Internal Medicine

## 2019-03-31 DIAGNOSIS — K921 Melena: Secondary | ICD-10-CM | POA: Diagnosis not present

## 2019-03-31 DIAGNOSIS — Z888 Allergy status to other drugs, medicaments and biological substances status: Secondary | ICD-10-CM

## 2019-03-31 DIAGNOSIS — R197 Diarrhea, unspecified: Secondary | ICD-10-CM | POA: Diagnosis present

## 2019-03-31 DIAGNOSIS — J9811 Atelectasis: Secondary | ICD-10-CM | POA: Diagnosis present

## 2019-03-31 DIAGNOSIS — Z20828 Contact with and (suspected) exposure to other viral communicable diseases: Secondary | ICD-10-CM | POA: Diagnosis not present

## 2019-03-31 DIAGNOSIS — I10 Essential (primary) hypertension: Secondary | ICD-10-CM | POA: Diagnosis not present

## 2019-03-31 DIAGNOSIS — M25511 Pain in right shoulder: Secondary | ICD-10-CM | POA: Diagnosis present

## 2019-03-31 DIAGNOSIS — Z8249 Family history of ischemic heart disease and other diseases of the circulatory system: Secondary | ICD-10-CM

## 2019-03-31 DIAGNOSIS — Z79899 Other long term (current) drug therapy: Secondary | ICD-10-CM

## 2019-03-31 DIAGNOSIS — J9601 Acute respiratory failure with hypoxia: Principal | ICD-10-CM | POA: Diagnosis present

## 2019-03-31 DIAGNOSIS — R51 Headache: Secondary | ICD-10-CM | POA: Diagnosis not present

## 2019-03-31 DIAGNOSIS — Z86010 Personal history of colonic polyps: Secondary | ICD-10-CM

## 2019-03-31 DIAGNOSIS — Z6832 Body mass index (BMI) 32.0-32.9, adult: Secondary | ICD-10-CM

## 2019-03-31 DIAGNOSIS — Z96651 Presence of right artificial knee joint: Secondary | ICD-10-CM | POA: Diagnosis present

## 2019-03-31 DIAGNOSIS — G894 Chronic pain syndrome: Secondary | ICD-10-CM | POA: Diagnosis present

## 2019-03-31 DIAGNOSIS — F322 Major depressive disorder, single episode, severe without psychotic features: Secondary | ICD-10-CM | POA: Diagnosis present

## 2019-03-31 DIAGNOSIS — I2693 Single subsegmental pulmonary embolism without acute cor pulmonale: Secondary | ICD-10-CM | POA: Diagnosis present

## 2019-03-31 DIAGNOSIS — R05 Cough: Secondary | ICD-10-CM | POA: Diagnosis not present

## 2019-03-31 DIAGNOSIS — Z91030 Bee allergy status: Secondary | ICD-10-CM

## 2019-03-31 DIAGNOSIS — Z86711 Personal history of pulmonary embolism: Secondary | ICD-10-CM

## 2019-03-31 DIAGNOSIS — Z9049 Acquired absence of other specified parts of digestive tract: Secondary | ICD-10-CM

## 2019-03-31 DIAGNOSIS — Z811 Family history of alcohol abuse and dependence: Secondary | ICD-10-CM

## 2019-03-31 DIAGNOSIS — Y92009 Unspecified place in unspecified non-institutional (private) residence as the place of occurrence of the external cause: Secondary | ICD-10-CM

## 2019-03-31 DIAGNOSIS — E662 Morbid (severe) obesity with alveolar hypoventilation: Secondary | ICD-10-CM | POA: Diagnosis not present

## 2019-03-31 DIAGNOSIS — E039 Hypothyroidism, unspecified: Secondary | ICD-10-CM | POA: Diagnosis present

## 2019-03-31 DIAGNOSIS — E11649 Type 2 diabetes mellitus with hypoglycemia without coma: Secondary | ICD-10-CM | POA: Diagnosis present

## 2019-03-31 DIAGNOSIS — J40 Bronchitis, not specified as acute or chronic: Secondary | ICD-10-CM | POA: Diagnosis present

## 2019-03-31 DIAGNOSIS — R42 Dizziness and giddiness: Secondary | ICD-10-CM | POA: Diagnosis not present

## 2019-03-31 DIAGNOSIS — Z86718 Personal history of other venous thrombosis and embolism: Secondary | ICD-10-CM

## 2019-03-31 DIAGNOSIS — R0602 Shortness of breath: Secondary | ICD-10-CM

## 2019-03-31 DIAGNOSIS — K5730 Diverticulosis of large intestine without perforation or abscess without bleeding: Secondary | ICD-10-CM | POA: Diagnosis not present

## 2019-03-31 DIAGNOSIS — Z9841 Cataract extraction status, right eye: Secondary | ICD-10-CM

## 2019-03-31 DIAGNOSIS — R079 Chest pain, unspecified: Secondary | ICD-10-CM

## 2019-03-31 DIAGNOSIS — Z9842 Cataract extraction status, left eye: Secondary | ICD-10-CM

## 2019-03-31 DIAGNOSIS — Z885 Allergy status to narcotic agent status: Secondary | ICD-10-CM

## 2019-03-31 DIAGNOSIS — M1990 Unspecified osteoarthritis, unspecified site: Secondary | ICD-10-CM | POA: Diagnosis present

## 2019-03-31 DIAGNOSIS — Z90710 Acquired absence of both cervix and uterus: Secondary | ICD-10-CM

## 2019-03-31 DIAGNOSIS — G43909 Migraine, unspecified, not intractable, without status migrainosus: Secondary | ICD-10-CM | POA: Diagnosis present

## 2019-03-31 DIAGNOSIS — T45516A Underdosing of anticoagulants, initial encounter: Secondary | ICD-10-CM | POA: Diagnosis present

## 2019-03-31 DIAGNOSIS — E119 Type 2 diabetes mellitus without complications: Secondary | ICD-10-CM

## 2019-03-31 DIAGNOSIS — J45909 Unspecified asthma, uncomplicated: Secondary | ICD-10-CM | POA: Diagnosis present

## 2019-03-31 DIAGNOSIS — Z7901 Long term (current) use of anticoagulants: Secondary | ICD-10-CM

## 2019-03-31 DIAGNOSIS — Z5329 Procedure and treatment not carried out because of patient's decision for other reasons: Secondary | ICD-10-CM | POA: Diagnosis present

## 2019-03-31 DIAGNOSIS — Z7984 Long term (current) use of oral hypoglycemic drugs: Secondary | ICD-10-CM

## 2019-03-31 DIAGNOSIS — Z79890 Hormone replacement therapy (postmenopausal): Secondary | ICD-10-CM

## 2019-03-31 DIAGNOSIS — F419 Anxiety disorder, unspecified: Secondary | ICD-10-CM | POA: Diagnosis present

## 2019-03-31 DIAGNOSIS — R0902 Hypoxemia: Secondary | ICD-10-CM | POA: Diagnosis not present

## 2019-03-31 DIAGNOSIS — J329 Chronic sinusitis, unspecified: Secondary | ICD-10-CM | POA: Diagnosis present

## 2019-03-31 DIAGNOSIS — K219 Gastro-esophageal reflux disease without esophagitis: Secondary | ICD-10-CM | POA: Diagnosis present

## 2019-03-31 DIAGNOSIS — Z880 Allergy status to penicillin: Secondary | ICD-10-CM

## 2019-03-31 DIAGNOSIS — Z8673 Personal history of transient ischemic attack (TIA), and cerebral infarction without residual deficits: Secondary | ICD-10-CM

## 2019-03-31 DIAGNOSIS — Z791 Long term (current) use of non-steroidal anti-inflammatories (NSAID): Secondary | ICD-10-CM

## 2019-03-31 DIAGNOSIS — Z87442 Personal history of urinary calculi: Secondary | ICD-10-CM

## 2019-03-31 DIAGNOSIS — Z87891 Personal history of nicotine dependence: Secondary | ICD-10-CM

## 2019-03-31 DIAGNOSIS — R0789 Other chest pain: Secondary | ICD-10-CM | POA: Diagnosis not present

## 2019-03-31 DIAGNOSIS — G4733 Obstructive sleep apnea (adult) (pediatric): Secondary | ICD-10-CM

## 2019-03-31 LAB — COMPREHENSIVE METABOLIC PANEL
ALT: 8 U/L (ref 0–44)
AST: 15 U/L (ref 15–41)
Albumin: 3.7 g/dL (ref 3.5–5.0)
Alkaline Phosphatase: 54 U/L (ref 38–126)
Anion gap: 10 (ref 5–15)
BUN: 6 mg/dL — ABNORMAL LOW (ref 8–23)
CO2: 27 mmol/L (ref 22–32)
Calcium: 8.9 mg/dL (ref 8.9–10.3)
Chloride: 104 mmol/L (ref 98–111)
Creatinine, Ser: 0.75 mg/dL (ref 0.44–1.00)
GFR calc Af Amer: >60 mL/min (ref >60)
GFR calc non Af Amer: >60 mL/min (ref >60)
Glucose, Bld: 92 mg/dL (ref 70–99)
Potassium: 4.2 mmol/L (ref 3.5–5.1)
Sodium: 141 mmol/L (ref 135–145)
Total Bilirubin: 0.8 mg/dL (ref 0.3–1.2)
Total Protein: 6.9 g/dL (ref 6.5–8.1)

## 2019-03-31 LAB — CBC WITH DIFFERENTIAL/PLATELET
Abs Immature Granulocytes: 0.01 10*3/uL (ref 0.00–0.07)
Basophils Absolute: 0 10*3/uL (ref 0.0–0.1)
Basophils Relative: 0 %
Eosinophils Absolute: 0.2 10*3/uL (ref 0.0–0.5)
Eosinophils Relative: 4 %
HCT: 42.5 % (ref 36.0–46.0)
Hemoglobin: 13.6 g/dL (ref 12.0–15.0)
Immature Granulocytes: 0 %
Lymphocytes Relative: 25 %
Lymphs Abs: 1.3 10*3/uL (ref 0.7–4.0)
MCH: 27.9 pg (ref 26.0–34.0)
MCHC: 32 g/dL (ref 30.0–36.0)
MCV: 87.1 fL (ref 80.0–100.0)
Monocytes Absolute: 0.6 10*3/uL (ref 0.1–1.0)
Monocytes Relative: 11 %
Neutro Abs: 3 10*3/uL (ref 1.7–7.7)
Neutrophils Relative %: 60 %
Platelets: 248 10*3/uL (ref 150–400)
RBC: 4.88 MIL/uL (ref 3.87–5.11)
RDW: 13.2 % (ref 11.5–15.5)
WBC: 5 10*3/uL (ref 4.0–10.5)
nRBC: 0 % (ref 0.0–0.2)

## 2019-03-31 LAB — HEPARIN LEVEL (UNFRACTIONATED): Heparin Unfractionated: <0.1 IU/mL — ABNORMAL LOW (ref 0.30–0.70)

## 2019-03-31 LAB — SARS CORONAVIRUS 2 BY RT PCR (HOSPITAL ORDER, PERFORMED IN ~~LOC~~ HOSPITAL LAB): SARS Coronavirus 2: NEGATIVE

## 2019-03-31 LAB — TROPONIN I (HIGH SENSITIVITY)
Troponin I (High Sensitivity): 3 ng/L (ref <18)
Troponin I (High Sensitivity): 4 ng/L (ref <18)

## 2019-03-31 LAB — URINALYSIS, ROUTINE W REFLEX MICROSCOPIC
Bilirubin Urine: NEGATIVE
Glucose, UA: NEGATIVE mg/dL
Hgb urine dipstick: NEGATIVE
Ketones, ur: NEGATIVE mg/dL
Leukocytes,Ua: NEGATIVE
Nitrite: NEGATIVE
Protein, ur: NEGATIVE mg/dL
Specific Gravity, Urine: 1.008 (ref 1.005–1.030)
pH: 7 (ref 5.0–8.0)

## 2019-03-31 LAB — APTT: aPTT: 32 seconds (ref 24–36)

## 2019-03-31 LAB — BRAIN NATRIURETIC PEPTIDE: B Natriuretic Peptide: 94.9 pg/mL (ref 0.0–100.0)

## 2019-03-31 MED ORDER — IOHEXOL 300 MG/ML  SOLN
100.0000 mL | Freq: Once | INTRAMUSCULAR | Status: AC | PRN
  Administered 2019-03-31: 100 mL via INTRAVENOUS

## 2019-03-31 MED ORDER — ACETAMINOPHEN 500 MG PO TABS
500.0000 mg | ORAL_TABLET | Freq: Once | ORAL | Status: AC
  Administered 2019-03-31: 500 mg via ORAL
  Filled 2019-03-31: qty 1

## 2019-03-31 MED ORDER — ACETAMINOPHEN 325 MG PO TABS
325.0000 mg | ORAL_TABLET | Freq: Once | ORAL | Status: AC
  Administered 2019-03-31: 325 mg via ORAL
  Filled 2019-03-31: qty 1

## 2019-03-31 MED ORDER — APIXABAN 2.5 MG PO TABS: 2.5000 mg | ORAL_TABLET | Freq: Two times a day (BID) | ORAL | 0 refills | Status: DC

## 2019-03-31 MED ORDER — APIXABAN 2.5 MG PO TABS: 2.5000 mg | ORAL_TABLET | Freq: Two times a day (BID) | ORAL | Status: DC

## 2019-03-31 MED ORDER — HEPARIN (PORCINE) 25000 UT/250ML-% IV SOLN
1350.0000 [IU]/h | INTRAVENOUS | Status: AC
  Administered 2019-04-01: 1400 [IU]/h via INTRAVENOUS
  Filled 2019-03-31 (×2): qty 250

## 2019-03-31 MED ORDER — ONDANSETRON 4 MG PO TBDP
4.0000 mg | ORAL_TABLET | Freq: Once | ORAL | Status: AC
  Administered 2019-03-31: 4 mg via ORAL
  Filled 2019-03-31: qty 1

## 2019-03-31 MED ORDER — HEPARIN BOLUS VIA INFUSION
5000.0000 [IU] | Freq: Once | INTRAVENOUS | Status: AC
  Administered 2019-04-01: 5000 [IU] via INTRAVENOUS
  Filled 2019-03-31: qty 5000

## 2019-03-31 MED ORDER — ALBUTEROL SULFATE HFA 108 (90 BASE) MCG/ACT IN AERS
2.0000 | INHALATION_SPRAY | Freq: Four times a day (QID) | RESPIRATORY_TRACT | Status: DC | PRN
  Filled 2019-03-31: qty 6.7

## 2019-03-31 NOTE — ED Triage Notes (Addendum)
Pt from home via ems; c/o chest discomfort since 8 am, pressure; no radiation; central; some sob; clear with ems; also c/o HA and near syncope; some cough, denies sick contacts; also reports diarrhea and loss of appetite x 2 weeks; 324 asa pta; refused nitro with ems; Hx stroke, DM; pt also endorses generalized weakness and increased urinary frequency  146/114 P 66 NS RR 22 94% RA CBG 99 T 56F

## 2019-03-31 NOTE — H&P (Signed)
History and Physical    Hayley [name]:[id number] DOB: [birth year] DOA: 03/31/2019  PCP: [name], MD   Patient coming from: Home   Chief Complaint: SOB, chest pressure, GI discomfort and loose stools   HPI: Hayley [name] is a 73 y.o. female with medical history significant for recurrent VTE now taking low-dose Eliquis inconsistently, suspected asthma or COPD, depression and anxiety, type 2 diabetes mellitus, hypothyroidism, history of CVA, and chronic migraines, now presenting to the emergency department for evaluation of chest discomfort, shortness of breath, and GI upset.  Patient reports that she has had a poor appetite and intermittent abdominal discomfort and loose stools for the past 1 to 2 weeks, and then woke this morning with intense chest pressure and shortness of breath.  Chest pressure was mainly in the central chest and she is also had some pain that radiates towards the right shoulder.  Chest pressure has been constant.  She denies any lower extremity swelling recently, but has developed some pain in the bilateral calves.  She saw some red blood in the toilet bowl recently.  She denies any fevers or chills, has had occasional cough, and denies any sick contacts.  She reports history of upper and lower extremity DVT, as well as multiple PEs, stating that she was anticoagulated for about a year after a PE in 2013, suffered recurrence in 2016 after surgery, currently taking Eliquis 2.5 mg twice daily, but takes this inconsistently and has not had any doses in a few days.  ED Course: Upon arrival to the ED, patient is found to be afebrile, saturating upper 80s at rest and 70s with ambulation, and with blood pressure 105/58.  EKG features a sinus rhythm and chest x-ray is notable for chronic interstitial changes that appear similar to prior.  CT the abdomen pelvis is negative for acute abnormalities.  Chemistry panel and CBC are unremarkable, troponin is normal x2, BNP normal, and urinalysis  unremarkable.  Patient was treated with acetaminophen and Zofran in the emergency department, placed on supplemental oxygen, and hospitalist consulted for admission.  Review of Systems:  All other systems reviewed and apart from HPI, are negative.  Past Medical History:  Diagnosis Date   Anemia    Anxiety    Bronchitis    hx of    Clotting disorder ([place]) 02-17-14   S/p appendectomy-developed Pulmonary emboli-tx. warfarin-d/c 1 yr ago.   Colon polyps    Depression    Diabetes mellitus without complication ([place])    "borderline"- oral med   Disorder of vocal cords    spasmotic dysphonia ,02-17-14 has" whispery voice-low tone"   Hyperthyroidism    Kidney stones    Migraines    Nausea Nov 15, 2014   cycle of migraine headaches and nausea   Neuromuscular disorder ([place])    hands and throat"spasmodic dysphonia", tremors - thimb and forefinger    Obesity, Class III, BMI 40-49.9 (morbid obesity) ([place]) 04/27/2010   02-17-14 reports some weight loss- intentional   Osteoarthritis    in back(chronic pain)   Pelvic pain    02-17-14 "states thinks its back related on left groin"   Pulmonary emboli [facility])    s/p Appendectomy '13 [facility]   Reflux    Right knee pain    Sleep apnea    cpap - settings at 4    Toxic goiter     Past Surgical History:  Procedure Laterality Date   ABDOMINAL HYSTERECTOMY     APPENDECTOMY  3'29 [facility] s/p developed  Pulmonary Emboli   BOTOX INJECTION     for migranes   CATARACT EXTRACTION, BILATERAL Bilateral 03/2013    CHOLECYSTECTOMY     HAND SURGERY  2004   both hands   KIDNEY STONE SURGERY     KNEE ARTHROSCOPY  1998   right   LAPAROSCOPIC APPENDECTOMY  11/27/2011   Procedure: APPENDECTOMY LAPAROSCOPIC;  Surgeon: Adin Hector, MD;  Location: [facility] ORS;  Service: General;  Laterality: N/A;   SPINAL CORD STIMULATOR IMPLANT  06/04/2016   Dr. Alfonse Ras at [facility] Right 12/06/2014   Procedure:  RIGHT TOTAL KNEE ARTHROPLASTY;  Surgeon: Gaynelle Arabian, MD;  Location: [facility] ORS;  Service: Orthopedics;  Laterality: Right;   TOTAL KNEE REVISION Left 02/24/2014   Procedure: LEFT TOTAL KNEE REVISION;  Surgeon: Gearlean Alf, MD;  Location: [facility];  Service: Orthopedics;  Laterality: Left;   vocal cord surgery  02-17-14   01-12-14 done in [place] [facility](s/p selective denervation/reinnervation recurrent laryngeal nerve surgery)   zenkers diverticulum       reports that she quit smoking about 37 years ago. She has never used smokeless tobacco. She reports that she does not drink alcohol or use drugs.  Allergies  Allergen Reactions   Bee Venom Anaphylaxis   Tape Rash    Rash - only use paper tape   Etodolac Diarrhea and Nausea And Vomiting   Morphine Other (See Comments)    Hallucinations   Other Itching and Swelling    Bee venom   Poison Ivy Extract  [Poison Ivy Extract] Itching and Swelling   Propoxyphene Nausea And Vomiting   Propoxyphene N-Acetaminophen Nausea And Vomiting   Penicillins Rash    Has patient had a PCN reaction causing immediate rash, facial/tongue/throat swelling, SOB or lightheadedness with hypotension: Yes Has patient had a PCN reaction causing severe rash involving mucus membranes or skin necrosis: No Has patient had a PCN reaction that required hospitalization No Has patient had a PCN reaction occurring within the last 10 years: No If all of the above answers are "NO", then may proceed with Cephalosporin use.     Family History  Problem Relation Age of Onset   Heart disease Mother    Pneumonia Father    Cirrhosis Father    Hypertension Father    Alcoholism Father    Heart attack Paternal Grandfather    Heart attack Maternal Grandfather    Heart attack Paternal Grandmother    Heart attack Maternal Grandmother      Prior to Admission medications   Medication Sig Start Date End Date Taking? Authorizing Provider  albuterol (PROVENTIL  HFA;VENTOLIN HFA) 108 (90 BASE) MCG/ACT inhaler Inhale 1 puff into the lungs every 6 (six) hours as needed for wheezing or shortness of breath.   Yes [provider]  albuterol (PROVENTIL) (2.5 MG/3ML) 0.083% nebulizer solution Take 2.5 mg by nebulization every 6 (six) hours as needed for wheezing or shortness of breath. Reported on 01/10/2016   Yes [provider]  apixaban (ELIQUIS) 2.5 MG TABS tablet Take 1 tablet (2.5 mg total) by mouth 2 (two) times daily. 12/20/16  Yes Magrinat, Virgie Dad, MD  clonazePAM (KLONOPIN) 0.5 MG tablet Take 1 tablet (0.5 mg total) by mouth at bedtime as needed for anxiety. 11/12/18  Yes Marcial Pacas, MD  cyanocobalamin (,VITAMIN B-12,) 1000 MCG/ML injection Inject 1 mL into the skin every 30 (thirty) days. 04/03/16  Yes [provider]  dicyclomine (BENTYL) 20 MG tablet Take 20 mg by mouth every 6 (six) hours as needed for pain.   Yes [provider]  diphenhydrAMINE (BENADRYL) 25 MG tablet Take 50 mg by mouth every 6 (six) hours as needed for itching.   Yes [provider]  glipiZIDE (GLUCOTROL XL) 10 MG 24 hr tablet Take 10 mg by mouth daily. 02/03/19  Yes [provider]  hyoscyamine (ANASPAZ) 0.125 MG TBDP disintergrating tablet Place 0.125 mg under the tongue every 4 (four) hours as needed.   Yes [provider]  levothyroxine (SYNTHROID, LEVOTHROID) 25 MCG tablet Take 25 mcg by mouth every morning.    Yes [provider]  meloxicam (MOBIC) 15 MG tablet Take 15 mg by mouth daily as needed for pain. 03/05/19  Yes [provider]  mometasone-formoterol (DULERA) 100-5 MCG/ACT AERO Inhale 2 puffs into the lungs 2 (two) times daily.   Yes [provider]  MYRBETRIQ 50 MG TB24 tablet Take 50 mg by mouth at bedtime.  03/19/19  Yes [provider]  OnabotulinumtoxinA (BOTOX IM) Inject 200 Units into the muscle every 3 (three) months.   Yes [provider]  ondansetron  (ZOFRAN-ODT) 8 MG disintegrating tablet Take 8 mg by mouth daily as needed for nausea.  04/18/18  Yes [provider]  propranolol (INDERAL) 60 MG tablet Take 60 mg by mouth 2 (two) times daily.   Yes [name], MD  traZODone (DESYREL) 100 MG tablet Take 1/2 to one tab at bed time as needed Patient taking differently: Take 50-100 mg by mouth at bedtime as needed for sleep. Take 1/2 to one tab at bed time as needed 02/04/19  Yes Arfeen, [name], MD  valACYclovir (VALTREX) 1000 MG tablet Take 2,000 mg by mouth 2 (two) times daily as needed (Fever blisters). Reported on 01/10/2016 10/14/14  Yes [provider]  vortioxetine HBr (TRINTELLIX) 20 MG TABS tablet Take 1 tablet (20 mg total) by mouth daily. 02/04/19  Yes Arfeen, [name], MD  apixaban (ELIQUIS) 2.5 MG TABS tablet Take 1 tablet (2.5 mg total) by mouth 2 (two) times daily. 03/31/19 04/30/19  Jose Persia, MD    Physical Exam: Vitals:   03/31/19 1955 03/31/19 1956 03/31/19 1957 03/31/19 1958  BP:      Pulse: (!) 58 (!) 57 (!) 58 (!) 58  Resp: 20 10 13 16   Temp:      TempSrc:      SpO2: (!) 85% (!) 82% (!) 89% (!) 89%  Weight:      Height:        Constitutional: NAD, calm  Eyes: PERTLA, lids and conjunctivae normal ENMT: Mucous membranes are moist. Posterior pharynx clear of any exudate or lesions.   Neck: normal, supple, no masses, no thyromegaly Respiratory: no wheezing, no crackles. No accessory muscle use.  Cardiovascular: S1 & S2 heard, regular rate and rhythm. No extremity edema.  Abdomen: No distension, no tenderness, soft. Bowel sounds active.  Musculoskeletal: no clubbing / cyanosis. No joint deformity upper and lower extremities.    Skin: no significant rashes, lesions, ulcers. Warm, dry, well-perfused. Neurologic: CN 2-12 grossly intact. Sensation intact. Strength 5/5 in all 4 limbs.  Psychiatric:  Alert and oriented x 3. Pleasant, cooperative.    Labs on Admission: I have personally reviewed following  labs and imaging studies  CBC: Recent Labs  Lab 03/31/19 1503  WBC 5.0  NEUTROABS 3.0  HGB 13.6  HCT 42.5  MCV 87.1  PLT 248  Basic Metabolic Panel: Recent Labs  Lab 03/31/19 1611  NA 141  K 4.2  CL 104  CO2 27  GLUCOSE 92  BUN 6*  CREATININE 0.75  CALCIUM 8.9   GFR: Estimated Creatinine Clearance: 70 mL/min (by C-G formula based on SCr of 0.75 mg/dL). Liver Function Tests: Recent Labs  Lab 03/31/19 1611  AST 15  ALT 8  ALKPHOS 54  BILITOT 0.8  PROT 6.9  ALBUMIN 3.7   No results for input(s): LIPASE, AMYLASE in the last 168 hours. No results for input(s): AMMONIA in the last 168 hours. Coagulation Profile: No results for input(s): INR, PROTIME in the last 168 hours. Cardiac Enzymes: No results for input(s): CKTOTAL, CKMB, CKMBINDEX, TROPONINI in the last 168 hours. BNP (last 3 results) No results for input(s): PROBNP in the last 8760 hours. HbA1C: No results for input(s): HGBA1C in the last 72 hours. CBG: No results for input(s): GLUCAP in the last 168 hours. Lipid Profile: No results for input(s): CHOL, HDL, LDLCALC, TRIG, CHOLHDL, LDLDIRECT in the last 72 hours. Thyroid Function Tests: No results for input(s): TSH, T4TOTAL, FREET4, T3FREE, THYROIDAB in the last 72 hours. Anemia Panel: No results for input(s): VITAMINB12, FOLATE, FERRITIN, TIBC, IRON, RETICCTPCT in the last 72 hours. Urine analysis:    Component Value Date/Time   COLORURINE YELLOW 03/31/2019 1551   APPEARANCEUR CLEAR 03/31/2019 1551   APPEARANCEUR CLOUDY 12/19/2014 0730   LABSPEC 1.008 03/31/2019 1551   LABSPEC 1.025 12/19/2014 0730   PHURINE 7.0 03/31/2019 1551   GLUCOSEU NEGATIVE 03/31/2019 1551   GLUCOSEU see comment 12/19/2014 0730   HGBUR NEGATIVE 03/31/2019 1551   BILIRUBINUR NEGATIVE 03/31/2019 1551   BILIRUBINUR n 05/08/2016 1506   BILIRUBINUR see comment 12/19/2014 0730   KETONESUR NEGATIVE 03/31/2019 1551   PROTEINUR NEGATIVE 03/31/2019 1551   UROBILINOGEN negative  05/08/2016 1506   UROBILINOGEN 1.0 11/30/2014 1215   NITRITE NEGATIVE 03/31/2019 1551   LEUKOCYTESUR NEGATIVE 03/31/2019 1551   LEUKOCYTESUR see comment 12/19/2014 0730   Sepsis Labs: @LABRCNTIP (procalcitonin:4,lacticidven:4) )No results found for this or any previous visit (from the past 240 hour(s)).   Radiological Exams on Admission: Ct Abdomen Pelvis W Contrast  Result Date: 03/31/2019 CLINICAL DATA:  Central chest pain.  Abdominal pain, generalized. EXAM: CT ABDOMEN AND PELVIS WITH CONTRAST TECHNIQUE: Multidetector CT imaging of the abdomen and pelvis was performed using the standard protocol following bolus administration of intravenous contrast. CONTRAST:  181mL OMNIPAQUE IOHEXOL 300 MG/ML  SOLN COMPARISON:  11/17/2017 FINDINGS: Lower chest: Linear scarring in the lung bases. No effusions. Heart is normal size. Hepatobiliary: No focal liver abnormality is seen. Status post cholecystectomy. No biliary dilatation. Pancreas: No focal abnormality or ductal dilatation. Spleen: No focal abnormality.  Normal size. Adrenals/Urinary Tract: No adrenal abnormality. No focal renal abnormality. No stones or hydronephrosis. Urinary bladder is unremarkable. Stomach/Bowel: Diffuse colonic diverticulosis, most pronounced in the descending colon and sigmoid colon. No evidence of active diverticulitis. Stomach and small bowel decompressed, unremarkable. Vascular/Lymphatic: Aortic atherosclerosis. No enlarged abdominal or pelvic lymph nodes. Reproductive: Prior hysterectomy.  No adnexal masses. Other: No free fluid or free air. Musculoskeletal: No acute bony abnormality. Degenerative disc and facet disease in the lumbar spine. IMPRESSION: No acute findings in the abdomen or pelvis. Colonic diverticulosis.  No changes of active diverticulitis. Aortic atherosclerosis. Bibasilar scarring. Electronically Signed   By: Rolm Baptise M.D.   On: 03/31/2019 18:55   Dg Chest Portable 1 View  Result Date:  03/31/2019 CLINICAL DATA:  Shortness of breath, chest  discomfort. EXAM: PORTABLE CHEST 1 VIEW COMPARISON:  Chest radiographs 11/20/2017, 11/19/2017, CT angiography chest 11/17/2017 FINDINGS: Body habitus and abundant apical fat pad likely contribute to the increased hazy opacity in both lung bases. There are chronic interstitial changes and airways thickening similar to prior exams. No acute consolidation, effusion or pneumothorax is evident. Cardiomegaly is similar to prior studies. Degenerative changes are present in the spine and shoulders. Thoracic nerve stimulator is seen. Cardiac monitoring leads overlie the chest. Necklace is noted at the base of the neck. IMPRESSION: Chronic interstitial changes and airways thickening, similar to prior studies. No definite acute cardiopulmonary process. Electronically Signed   By: Lovena Le M.D.   On: 03/31/2019 18:01    EKG: Independently reviewed. Sinus rhythm, QTc 428 ms.   Assessment/Plan   1. Chest pain; history of PE  - Presents with acute-onset of chest discomfort, SOB, and near-syncope  - EKG without acute ischemic features, high-sensitivity troponin neg x2, and CXR with chronic findings only  - With her hx of recurrent PE, poor adherence with Eliquis, significant hypoxia, and recent calf pain, PE is a primary concern  - Continue cardiac monitoring, start IV heparin, check CTA chest   2. Hypoxia  - Oxygen saturations upper 80's while at rest and low 70's when ambulating short distance in ED  - Patient reports chronic hypoxia but does not know the etiology, does not have supplemental O2 at home, and had not been dyspneic until the morning of her presentation  - CXR with interstitial abnormalities that appear chronic and unchanged  - There is no fever, leukocytosis, or sick contact and COVID-19 not yet resulted  - With hx of recurrent PE, poor adherence with Eliquis, recent calf pain, and new chest pain, PE is a concern but she cannot receive more  contrast for 24 hrs from CT abd/pelvis   - She has hx of suspected asthma or COPD but has not been wheezing, will continue inhalers   - Continue supplemental O2, anticoagulate with IV heparin, check CTA chest    3. Type II DM  - A1c was 9.4% in 2018  - Managed with glipizide at home, held on admission  - Check CBG's and use a low-intensity SSI with Novolog while in hospital    4. Depression, anxiety  - Stable, continue Trintellix, trazodone, Klonopin    5. Hypothyroidism  - Continue Synthroid    6. Abdominal discomfort, diarrhea   - Patient has long-standing intermittent abdominal discomfort and loose stools, reports intermittently loose stools and cramping discomfort worse in past 1-2 wks and reported seeing red blood in the toilet with some of the diarrhea episodes  - CT abd/pelvis is reassuring, Hgb is normal, no bleeding in ED  - Type and screen, follow closely while anticoagulating    PPE: Mask, face shield. Patient wearing mask.  DVT prophylaxis: IV heparin  Code Status: Full  Family Communication: Discussed with patient  Consults called: None Admission status: Observation     Vianne Bulls, MD [facility] Pager [phone]  If 7PM-7AM, please contact night-coverage [email or web address] Password [facility]  03/31/2019, 9:19 PM

## 2019-03-31 NOTE — ED Notes (Signed)
Pt given ginger ale per RN.  °

## 2019-03-31 NOTE — ED Provider Notes (Signed)
  Physical Exam  BP 119/76   Pulse (!) 58   Temp 98.8 F (37.1 C) (Oral)   Resp 16   Ht 5\' 6"  (1.676 m)   Wt 90.7 kg   LMP 09/18/1979 (Approximate)   SpO2 (!) 89%   BMI 32.28 kg/m   Physical Exam Vitals signs and nursing note reviewed.  Constitutional:      General: She is not in acute distress.    Appearance: She is well-developed. She is not diaphoretic.  HENT:     Head: Normocephalic and atraumatic.  Eyes:     General: No scleral icterus.    Conjunctiva/sclera: Conjunctivae normal.  Neck:     Musculoskeletal: Normal range of motion.  Pulmonary:     Effort: Pulmonary effort is normal. No respiratory distress.  Skin:    Findings: No rash.  Neurological:     Mental Status: She is alert.     ED Course/Procedures     Procedures  MDM  73 year old female with past medical history of PE, diabetes, hypertension presents to ED for chest pain, shortness of breath.  Symptoms began when she woke up this morning.  She ran out of her Eliquis 2 days ago.  Her work-up today was reassuring including troponin, BNP, chest x-ray and CT of the abdomen pelvis.  Patient oxygen saturations in upper 80s with sitting on room air.  She ambulated with oxygen saturations dropping down to 70s.  I am unable to obtain a CTA of the chest due to her prior contrast load earlier today for her CT of the abdomen pelvis.  However due to her hypoxia and history of PE, feel that observation overnight is warranted as well as a CTA to evaluate for PE.  Dr. Myna Hidalgo to admit.         Delia Heady, PA-C 03/31/19 2106    Veryl Speak, MD 04/02/19 1536

## 2019-03-31 NOTE — ED Notes (Signed)
Before walking with pt, O2 was between 84-88%. While walking O2 dropped down to 71-74%. Pt stated her head felt "weird" while walking. Pt is currently back in bed.

## 2019-03-31 NOTE — ED Notes (Signed)
Urine culture sent with urinalysis.  

## 2019-03-31 NOTE — Progress Notes (Addendum)
[place] for heparin Indication: hx VTE, r/o acute PE  Heparin Dosing Weight: 79.1 kg  Labs: Recent Labs    03/31/19 1503 03/31/19 1611  HGB 13.6  --   HCT 42.5  --   PLT 248  --   CREATININE  --  0.75    Assessment: 51 yof with history of PE on low-dose Eliquis with inconsistent use, concern for acute PE but can't get CTA for 24 hrs d/t already recieving contrast in ED. Pharmacy consulted to dose heparin with apixaban on hold. Patient reportedly ran out of Eliquis 2 days ago - last dose 7/11 at 2200. Pt also experiencing bloody diarrhea several times daily, most recently yesterday. MD aware and ok to aggressively dose heparin with bolus per discussion with Dr. Myna Hidalgo. CBC wnl.  Goal of Therapy:  Heparin level 0.3-0.7 units/ml aPTT 66-102 seconds Monitor platelets by anticoagulation protocol: Yes   Plan:  Baseline aPTT/heparin level Heparin 5000 unit bolus Start heparin at 1400 units/h 6h heparin level Daily heparin level/CBC Monitor s/sx bleeding  Elicia Lamp, PharmD, BCPS Clinical Pharmacist 03/31/2019 9:25 PM

## 2019-03-31 NOTE — ED Provider Notes (Signed)
[facility] EMERGENCY DEPARTMENT Provider Note   CSN: [id number] Arrival date & time: 03/31/19  1416    History   Chief Complaint Chief Complaint  Patient presents with   Chest Pain    HPI ALAIYAH BOLLMAN is a 73 y.o. female.with a PMHx of T2DM, HTN, PE, diverticulosis, who presents to the ED for evaluation of bloody diarrhea, SOB, chest pain.   Mrs. Willette states that for the past 1.5-2 weeks, she has been experiencing bloody diarrhea, with several episodes per day some days, with the most recent yesterday. The blood is noted to be in the bowl and not on the toilet paper. She endorses epigastric abdominal pain that radiates to the lower quadrant; abd pain is intermittent. Denies alleviating or aggravating factors. She also has nausea when eating and increased stomach growling but denies vomiting. She does not feel like this is related to her diet. Mrs. Hirschhorn endorses decreased energy for the past 2 weeks as well.   Mrs. Ethier states that this morning when she awoke, she noticed SOB with chest tightness/pressure that is located in the center and left sided chest. The SOB is worsened by lying down but no alleviating factors. Chest pain is constant and unchanging; is not worsened with exertion. She had a diaphoretic episode earlier that has resolved once EMS arrived. Prior to calling EMS, she checked her BG (99) and her BP, which was at baseline. Her PCP recommended she come in. She endorses lightheadedness with this, as well as frontal headache but denies fever, chills, rhinorrhea, sore throat, vision changes, LOC, leg swelling.   Patient also notes increased urinary frequency and feels like she is retaining urine, and that is why she needs to go more often. She recalls decreased urine volume. Denies hematuria and dysuria.    Mrs. Bruster husbands recently passed away. It has been difficult for her but she feels like she has been coping okay. She has a therapist and psychiatrist  that she follows up with.    HPI  Past Medical History:  Diagnosis Date   Anemia    Anxiety    Bronchitis    hx of    Clotting disorder ([place]) 02-17-14   S/p appendectomy-developed Pulmonary emboli-tx. warfarin-d/c 1 yr ago.   Colon polyps    Depression    Diabetes mellitus without complication ([place])    "borderline"- oral med   Disorder of vocal cords    spasmotic dysphonia ,02-17-14 has" whispery voice-low tone"   Hyperthyroidism    Kidney stones    Migraines    Nausea Nov 15, 2014   cycle of migraine headaches and nausea   Neuromuscular disorder ([place])    hands and throat"spasmodic dysphonia", tremors - thimb and forefinger    Obesity, Class III, BMI 40-49.9 (morbid obesity) ([place]) 04/27/2010   02-17-14 reports some weight loss- intentional   Osteoarthritis    in back(chronic pain)   Pelvic pain    02-17-14 "states thinks its back related on left groin"   Pulmonary emboli [facility])    s/p Appendectomy '13 [facility]   Reflux    Right knee pain    Sleep apnea    cpap - settings at 4    Toxic goiter     Patient Active Problem List   Diagnosis Date Noted   Other chronic pain 11/12/2018   CPAP (continuous positive airway pressure) dependence 02/27/2018   Neuropathy 02/27/2018   MDD (major depressive episode), single episode, severe, no psychosis ([place])  Pancreatitis 11/17/2017   Writer's cramp 01/02/2017   Neck pain on left side 09/26/2016   Tremor 06/20/2016   Obesity, Class II, BMI 35-39.9, isolated (see actual BMI) 04/09/2016   UTI (lower urinary tract infection)    Paresthesia of both lower extremities 03/22/2016   Vertigo 03/22/2016   Essential tremor 03/12/2016   Fall 03/12/2016   Low back pain 03/12/2016   Coagulopathy ([place]) 07/20/2015   Diabetes mellitus without complication ([place]) 29/51/8841   Essential hypertension 06/03/2015   OA (osteoarthritis) of knee 12/06/2014   Bilateral dry eyes 08/26/2014   Episcleritis of right eye  08/26/2014   Hypothyroidism 03/03/2014   Asthma 03/03/2014   Acute blood loss anemia 03/03/2014   Left knee pain 03/03/2014   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   OSA on CPAP 12/23/2013   History of pulmonary embolus (PE) 12/21/2013   Pseudoptosis 09/24/2013   Myofascial pain 09/08/2013   Pain syndrome, chronic 10/28/2012   Degenerative disc disease, lumbar 09/03/2012   Sacroiliac pain 09/03/2012   Scoliosis (and kyphoscoliosis), idiopathic 09/03/2012   Spinal stenosis of lumbar region 09/03/2012   Chronic migraine without aura 03/26/2012   History of spider veins 12/31/2011   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   Laryngospasms 07/13/2011   Obesity, morbid ([place]) 04/27/2010   ANEMIA 04/27/2010   ANXIETY DEPRESSION 04/27/2010   Chronic migraine 04/27/2010   NEUROPATHY 04/27/2010   HEMORRHOIDS 04/27/2010   GERD 04/27/2010   DIVERTICULOSIS, COLON 04/27/2010   IBS 04/27/2010   CHOLELITHIASIS 04/27/2010   RENAL CALCULUS 04/27/2010    Past Surgical History:  Procedure Laterality Date   ABDOMINAL HYSTERECTOMY     APPENDECTOMY     3'13 [facility] s/p developed  Pulmonary Emboli   BOTOX INJECTION     for migranes   CATARACT EXTRACTION, BILATERAL Bilateral 03/2013    CHOLECYSTECTOMY     HAND SURGERY  2004   both hands   KIDNEY STONE SURGERY     KNEE ARTHROSCOPY  1998   right   LAPAROSCOPIC APPENDECTOMY  11/27/2011   Procedure: APPENDECTOMY LAPAROSCOPIC;  Surgeon: Adin Hector, MD;  Location: [facility] ORS;  Service: General;  Laterality: N/A;   SPINAL CORD STIMULATOR IMPLANT  06/04/2016   Dr. Alfonse Ras at [place] Right 12/06/2014   Procedure: RIGHT TOTAL KNEE ARTHROPLASTY;  Surgeon: Gaynelle Arabian, MD;  Location: [facility];  Service: Orthopedics;  Laterality: Right;   TOTAL KNEE REVISION Left 02/24/2014   Procedure: LEFT TOTAL KNEE REVISION;  Surgeon: Gearlean Alf, MD;  Location: [facility];  Service:  Orthopedics;  Laterality: Left;   vocal cord surgery  02-17-14   01-12-14 done in [place] [facility](s/p selective denervation/reinnervation recurrent laryngeal nerve surgery)   zenkers diverticulum       OB History    Gravida  6   Para      Term      Preterm      AB  4   Living  2     SAB  4   TAB      Ectopic      Multiple      Live Births               Home Medications    Prior to Admission medications   Medication Sig Start Date End Date Taking? Authorizing Provider  albuterol (PROVENTIL HFA;VENTOLIN HFA) 108 (90 BASE) MCG/ACT inhaler Inhale 1 puff into the lungs every 6 (six) hours as needed for wheezing or  shortness of breath.   Yes [provider]  albuterol (PROVENTIL) (2.5 MG/3ML) 0.083% nebulizer solution Take 2.5 mg by nebulization every 6 (six) hours as needed for wheezing or shortness of breath. Reported on 01/10/2016   Yes [provider]  apixaban (ELIQUIS) 2.5 MG TABS tablet Take 1 tablet (2.5 mg total) by mouth 2 (two) times daily. 12/20/16  Yes Magrinat, Virgie Dad, MD  clonazePAM (KLONOPIN) 0.5 MG tablet Take 1 tablet (0.5 mg total) by mouth at bedtime as needed for anxiety. 11/12/18  Yes Marcial Pacas, MD  cyanocobalamin (,VITAMIN B-12,) 1000 MCG/ML injection Inject 1 mL into the skin every 30 (thirty) days. 04/03/16  Yes [provider]  dicyclomine (BENTYL) 20 MG tablet Take 20 mg by mouth every 6 (six) hours as needed for pain.   Yes [provider]  diphenhydrAMINE (BENADRYL) 25 MG tablet Take 50 mg by mouth every 6 (six) hours as needed for itching.   Yes [provider]  glipiZIDE (GLUCOTROL XL) 10 MG 24 hr tablet Take 10 mg by mouth daily. 02/03/19  Yes [provider]  hyoscyamine (ANASPAZ) 0.125 MG TBDP disintergrating tablet Place 0.125 mg under the tongue every 4 (four) hours as needed.   Yes [provider]  levothyroxine (SYNTHROID, LEVOTHROID) 25 MCG tablet Take 25 mcg by mouth every  morning.    Yes [provider]  meloxicam (MOBIC) 15 MG tablet Take 15 mg by mouth daily as needed for pain. 03/05/19  Yes [provider]  mometasone-formoterol (DULERA) 100-5 MCG/ACT AERO Inhale 2 puffs into the lungs 2 (two) times daily.   Yes [provider]  MYRBETRIQ 50 MG TB24 tablet Take 50 mg by mouth at bedtime.  03/19/19  Yes [provider]  OnabotulinumtoxinA (BOTOX IM) Inject 200 Units into the muscle every 3 (three) months.   Yes [provider]  ondansetron (ZOFRAN-ODT) 8 MG disintegrating tablet Take 8 mg by mouth daily as needed for nausea.  04/18/18  Yes [provider]  propranolol (INDERAL) 60 MG tablet Take 60 mg by mouth 2 (two) times daily.   Yes [name], MD  traZODone (DESYREL) 100 MG tablet Take 1/2 to one tab at bed time as needed Patient taking differently: Take 50-100 mg by mouth at bedtime as needed for sleep. Take 1/2 to one tab at bed time as needed 02/04/19  Yes Arfeen, [name], MD  valACYclovir (VALTREX) 1000 MG tablet Take 2,000 mg by mouth 2 (two) times daily as needed (Fever blisters). Reported on 01/10/2016 10/14/14  Yes [provider]  vortioxetine HBr (TRINTELLIX) 20 MG TABS tablet Take 1 tablet (20 mg total) by mouth daily. 02/04/19  Yes Arfeen, [name], MD  apixaban (ELIQUIS) 2.5 MG TABS tablet Take 1 tablet (2.5 mg total) by mouth 2 (two) times daily. 03/31/19 04/30/19  Jose Persia, MD    Family History Family History  Problem Relation Age of Onset   Heart disease Mother    Pneumonia Father    Cirrhosis Father    Hypertension Father    Alcoholism Father    Heart attack Paternal Grandfather    Heart attack Maternal Grandfather    Heart attack Paternal Grandmother    Heart attack Maternal Grandmother     Social History Social History   Tobacco Use   Smoking status: Former Smoker    Quit date: 01/03/1982    Years since quitting: 37.2   Smokeless tobacco: Never Used    Substance Use Topics  Alcohol use: No   Drug use: No     Allergies   Bee venom, Tape, Etodolac, Morphine, Other, Poison ivy extract  [poison ivy extract], Propoxyphene, Propoxyphene n-acetaminophen, and Penicillins   Review of Systems Review of Systems  Constitutional: Positive for diaphoresis and fatigue. Negative for appetite change, chills and fever.  HENT: Negative for congestion, rhinorrhea and sore throat.   Eyes: Negative for photophobia and visual disturbance.  Respiratory: Positive for cough (nonproductive), chest tightness and shortness of breath.   Cardiovascular: Positive for chest pain. Negative for palpitations and leg swelling.  Gastrointestinal: Positive for abdominal pain, blood in stool, diarrhea and nausea. Negative for vomiting.  Genitourinary: Positive for decreased urine volume, frequency and urgency. Negative for difficulty urinating, dysuria, enuresis, flank pain and hematuria.  Musculoskeletal: Negative for arthralgias and myalgias.  Skin: Negative for rash and wound.  Neurological: Positive for tremors (chronic), weakness (generalized) and headaches (frontal). Negative for dizziness, syncope and light-headedness.  Psychiatric/Behavioral: Negative for self-injury and suicidal ideas.     Physical Exam Updated Vital Signs BP 117/87    Pulse 61    Temp 98.8 F (37.1 C) (Oral)    Resp 15    Ht 5\' 6"  (1.676 m)    Wt 90.7 kg    LMP 09/18/1979 (Approximate)    SpO2 93%    BMI 32.28 kg/m   Physical Exam Constitutional:      General: She is not in acute distress.    Appearance: She is obese. She is not toxic-appearing.  HENT:     Head: Normocephalic and atraumatic.  Eyes:     Extraocular Movements: Extraocular movements intact.  Cardiovascular:     Rate and Rhythm: Normal rate and regular rhythm.     Heart sounds: Normal heart sounds. No murmur.  Pulmonary:     Effort: Pulmonary effort is normal. No tachypnea or respiratory distress.     Breath  sounds: Normal breath sounds.  Abdominal:     General: Bowel sounds are normal.     Palpations: Abdomen is soft.     Tenderness: There is abdominal tenderness (RUQ, epigastric). There is no guarding or rebound.  Musculoskeletal:     Right lower leg: She exhibits no tenderness. No edema.     Left lower leg: She exhibits no tenderness. No edema.  Skin:    General: Skin is warm and dry.  Neurological:     General: No focal deficit present.     Mental Status: She is alert and oriented to person, place, and time.     Motor: No weakness.  Psychiatric:        Mood and Affect: Mood normal.        Behavior: Behavior normal.      ED Treatments / Results  Labs (all labs ordered are listed, but only abnormal results are displayed) Labs Reviewed  COMPREHENSIVE METABOLIC PANEL - Abnormal; Notable for the following components:      Result Value   BUN 6 (*)    All other components within normal limits  CBC WITH DIFFERENTIAL/PLATELET  URINALYSIS, ROUTINE W REFLEX MICROSCOPIC  BRAIN NATRIURETIC PEPTIDE  TROPONIN I (HIGH SENSITIVITY)  TROPONIN I (HIGH SENSITIVITY)    EKG EKG Interpretation  Date/Time:  Tuesday March 31 2019 15:02:36 EDT Ventricular Rate:  58 PR Interval:    QRS Duration: 86 QT Interval:  435 QTC Calculation: 428 R Axis:   39 Text Interpretation:  Sinus rhythm Normal ECG Confirmed by Veryl Speak [phone]) on 03/31/2019 3:05:31  PM   Radiology Ct Abdomen Pelvis W Contrast  Result Date: 03/31/2019 CLINICAL DATA:  Central chest pain.  Abdominal pain, generalized. EXAM: CT ABDOMEN AND PELVIS WITH CONTRAST TECHNIQUE: Multidetector CT imaging of the abdomen and pelvis was performed using the standard protocol following bolus administration of intravenous contrast. CONTRAST:  128mL OMNIPAQUE IOHEXOL 300 MG/ML  SOLN COMPARISON:  11/17/2017 FINDINGS: Lower chest: Linear scarring in the lung bases. No effusions. Heart is normal size. Hepatobiliary: No focal liver abnormality is  seen. Status post cholecystectomy. No biliary dilatation. Pancreas: No focal abnormality or ductal dilatation. Spleen: No focal abnormality.  Normal size. Adrenals/Urinary Tract: No adrenal abnormality. No focal renal abnormality. No stones or hydronephrosis. Urinary bladder is unremarkable. Stomach/Bowel: Diffuse colonic diverticulosis, most pronounced in the descending colon and sigmoid colon. No evidence of active diverticulitis. Stomach and small bowel decompressed, unremarkable. Vascular/Lymphatic: Aortic atherosclerosis. No enlarged abdominal or pelvic lymph nodes. Reproductive: Prior hysterectomy.  No adnexal masses. Other: No free fluid or free air. Musculoskeletal: No acute bony abnormality. Degenerative disc and facet disease in the lumbar spine. IMPRESSION: No acute findings in the abdomen or pelvis. Colonic diverticulosis.  No changes of active diverticulitis. Aortic atherosclerosis. Bibasilar scarring. Electronically Signed   By: Rolm Baptise M.D.   On: 03/31/2019 18:55   Dg Chest Portable 1 View  Result Date: 03/31/2019 CLINICAL DATA:  Shortness of breath, chest discomfort. EXAM: PORTABLE CHEST 1 VIEW COMPARISON:  Chest radiographs 11/20/2017, 11/19/2017, CT angiography chest 11/17/2017 FINDINGS: Body habitus and abundant apical fat pad likely contribute to the increased hazy opacity in both lung bases. There are chronic interstitial changes and airways thickening similar to prior exams. No acute consolidation, effusion or pneumothorax is evident. Cardiomegaly is similar to prior studies. Degenerative changes are present in the spine and shoulders. Thoracic nerve stimulator is seen. Cardiac monitoring leads overlie the chest. Necklace is noted at the base of the neck. IMPRESSION: Chronic interstitial changes and airways thickening, similar to prior studies. No definite acute cardiopulmonary process. Electronically Signed   By: Lovena Le M.D.   On: 03/31/2019 18:01    Procedures Procedures  (including critical care time)  Medications Ordered in ED Medications  acetaminophen (TYLENOL) tablet 325 mg (325 mg Oral Given 03/31/19 1713)  iohexol (OMNIPAQUE) 300 MG/ML solution 100 mL (100 mLs Intravenous Contrast Given 03/31/19 1818)  ondansetron (ZOFRAN-ODT) disintegrating tablet 4 mg (4 mg Oral Given 03/31/19 1930)  acetaminophen (TYLENOL) tablet 500 mg (500 mg Oral Given 03/31/19 1930)     Initial Impression / Assessment and Plan / ED Course  I have reviewed the triage vital signs and the nursing notes.  Pertinent labs & imaging results that were available during my care of the patient were reviewed by me and considered in my medical decision making (see chart for details).  Mrs. Jennette Leask is a 73 y/o female with an extensive PMHx as listed in the HPI that includes HTN, PE, T2DM and diverticulosis. She comes in with multiple complaints, including bloody diarrhea, chest tightness and SOB. She also c/o urinary frequency due to possible urinary retention.   For further evaluation of bloody diarrhea, CBC, CMP and CT abdomen have been ordered. She does have a history of diverticulosis that may have turned into diverticulitis. Differential also includes hemorrhoids (she also has a history of this). Infectious causes are less likely as symptoms have been ongoing for 2 weeks. Vital signs are stable and hemoglobin is WNL, so bleeding is not likely substantial. CMP was WNL as  well. CT abdomen with contrast was negative for acute diverticulitis, as well as other acute changes. At this time, patient most likely will need a colonoscopy for further work up of her hematochezia, but this is something she can follow up with her PCP regarding.   Patient describes a sudden onset chest tightness/pressure with SOB, that was present when she woke up. SOB gets worse with lying flat but chest pain has no aggravating or alleviating factors. On examination, no abnormal findings, but patient's o2 sats were 92% on  4L. She states hypoxia is chronic for her though, but not on home oxygen. To work up for ACS, EKG and troponin were ordered. EKG did not show any sign of ischemia or infarction. Initial high sensitivity trop was 3 and second trop was 4. With the negative trops and EKG, ACS is an unlikely source of her SOB/CP. Although patient denies leg swelling, she does describe orthopnea. BNP and chest xray were ordered. BNP was under 100 so this is not likely a CHF exacerbation. Chest xray did show some chronic interstitial changes and airway thickening that may explain hypoxia. No infiltrates on xray and patient denies fever, chills, which makes pneumonia less likely. Hemoglobin was WNL so SOB not from anemia. Mrs. Riso does have a hx of PE post-surgically but she has been active in the past weeks by gardening and she is taking Elliquis. She notes she ran out of Elliquis and has been taking her husbands though, so I will refill. She does have low O2 sats in the low 90s while on 4L of oxygen, per nursing, however O2 sats improved when examined by my attending, Dr. Stark Jock.   At this time, we feel that Mrs. Droge is safe for discharge. All of her labs and imaging have returned with no acute changes.  It was discussed with patient that she should return if any new symptoms occur or she has worsening of current symptoms. She was instructed to follow up with her PCP for further evaluation of her hematochezia, SOB, and urinary retention.     Jose Persia, MD PGY1   Final Clinical Impressions(s) / ED Diagnoses   Final diagnoses:  Chest pain, unspecified type  Shortness of breath  Bloody diarrhea    ED Discharge Orders         Ordered    apixaban (ELIQUIS) 2.5 MG TABS tablet  2 times daily     03/31/19 1917           Veryl Speak, MD 04/02/19 1549

## 2019-04-01 ENCOUNTER — Observation Stay (HOSPITAL_COMMUNITY): Payer: Medicare Other

## 2019-04-01 ENCOUNTER — Encounter (HOSPITAL_COMMUNITY): Payer: Self-pay | Admitting: Internal Medicine

## 2019-04-01 ENCOUNTER — Inpatient Hospital Stay (HOSPITAL_COMMUNITY): Payer: Medicare Other

## 2019-04-01 DIAGNOSIS — R5381 Other malaise: Secondary | ICD-10-CM | POA: Diagnosis not present

## 2019-04-01 DIAGNOSIS — Z885 Allergy status to narcotic agent status: Secondary | ICD-10-CM | POA: Diagnosis not present

## 2019-04-01 DIAGNOSIS — E119 Type 2 diabetes mellitus without complications: Secondary | ICD-10-CM | POA: Diagnosis not present

## 2019-04-01 DIAGNOSIS — R0602 Shortness of breath: Secondary | ICD-10-CM

## 2019-04-01 DIAGNOSIS — R079 Chest pain, unspecified: Secondary | ICD-10-CM | POA: Diagnosis not present

## 2019-04-01 DIAGNOSIS — Z86711 Personal history of pulmonary embolism: Secondary | ICD-10-CM | POA: Diagnosis not present

## 2019-04-01 DIAGNOSIS — Z9049 Acquired absence of other specified parts of digestive tract: Secondary | ICD-10-CM | POA: Diagnosis not present

## 2019-04-01 DIAGNOSIS — Z9989 Dependence on other enabling machines and devices: Secondary | ICD-10-CM | POA: Diagnosis not present

## 2019-04-01 DIAGNOSIS — E039 Hypothyroidism, unspecified: Secondary | ICD-10-CM | POA: Diagnosis not present

## 2019-04-01 DIAGNOSIS — Y92009 Unspecified place in unspecified non-institutional (private) residence as the place of occurrence of the external cause: Secondary | ICD-10-CM | POA: Diagnosis not present

## 2019-04-01 DIAGNOSIS — Z7901 Long term (current) use of anticoagulants: Secondary | ICD-10-CM | POA: Diagnosis not present

## 2019-04-01 DIAGNOSIS — Z8673 Personal history of transient ischemic attack (TIA), and cerebral infarction without residual deficits: Secondary | ICD-10-CM | POA: Diagnosis not present

## 2019-04-01 DIAGNOSIS — Z6832 Body mass index (BMI) 32.0-32.9, adult: Secondary | ICD-10-CM | POA: Diagnosis not present

## 2019-04-01 DIAGNOSIS — Z5329 Procedure and treatment not carried out because of patient's decision for other reasons: Secondary | ICD-10-CM | POA: Diagnosis present

## 2019-04-01 DIAGNOSIS — M25511 Pain in right shoulder: Secondary | ICD-10-CM | POA: Diagnosis present

## 2019-04-01 DIAGNOSIS — Z87442 Personal history of urinary calculi: Secondary | ICD-10-CM | POA: Diagnosis not present

## 2019-04-01 DIAGNOSIS — R51 Headache: Secondary | ICD-10-CM | POA: Diagnosis not present

## 2019-04-01 DIAGNOSIS — I2693 Single subsegmental pulmonary embolism without acute cor pulmonale: Secondary | ICD-10-CM | POA: Diagnosis present

## 2019-04-01 DIAGNOSIS — F322 Major depressive disorder, single episode, severe without psychotic features: Secondary | ICD-10-CM | POA: Diagnosis present

## 2019-04-01 DIAGNOSIS — Z880 Allergy status to penicillin: Secondary | ICD-10-CM | POA: Diagnosis not present

## 2019-04-01 DIAGNOSIS — Z888 Allergy status to other drugs, medicaments and biological substances status: Secondary | ICD-10-CM | POA: Diagnosis not present

## 2019-04-01 DIAGNOSIS — R197 Diarrhea, unspecified: Secondary | ICD-10-CM | POA: Diagnosis present

## 2019-04-01 DIAGNOSIS — R1013 Epigastric pain: Secondary | ICD-10-CM | POA: Diagnosis not present

## 2019-04-01 DIAGNOSIS — E11649 Type 2 diabetes mellitus with hypoglycemia without coma: Secondary | ICD-10-CM | POA: Diagnosis present

## 2019-04-01 DIAGNOSIS — Z20828 Contact with and (suspected) exposure to other viral communicable diseases: Secondary | ICD-10-CM | POA: Diagnosis present

## 2019-04-01 DIAGNOSIS — G43909 Migraine, unspecified, not intractable, without status migrainosus: Secondary | ICD-10-CM | POA: Diagnosis present

## 2019-04-01 DIAGNOSIS — J9811 Atelectasis: Secondary | ICD-10-CM | POA: Diagnosis present

## 2019-04-01 DIAGNOSIS — J984 Other disorders of lung: Secondary | ICD-10-CM | POA: Diagnosis not present

## 2019-04-01 DIAGNOSIS — G4733 Obstructive sleep apnea (adult) (pediatric): Secondary | ICD-10-CM | POA: Diagnosis not present

## 2019-04-01 DIAGNOSIS — E662 Morbid (severe) obesity with alveolar hypoventilation: Secondary | ICD-10-CM | POA: Diagnosis present

## 2019-04-01 DIAGNOSIS — J9601 Acute respiratory failure with hypoxia: Secondary | ICD-10-CM | POA: Diagnosis not present

## 2019-04-01 DIAGNOSIS — T45516A Underdosing of anticoagulants, initial encounter: Secondary | ICD-10-CM | POA: Diagnosis present

## 2019-04-01 DIAGNOSIS — Z86718 Personal history of other venous thrombosis and embolism: Secondary | ICD-10-CM | POA: Diagnosis not present

## 2019-04-01 DIAGNOSIS — F419 Anxiety disorder, unspecified: Secondary | ICD-10-CM | POA: Diagnosis present

## 2019-04-01 DIAGNOSIS — G894 Chronic pain syndrome: Secondary | ICD-10-CM | POA: Diagnosis not present

## 2019-04-01 LAB — CBC WITH DIFFERENTIAL/PLATELET
Abs Immature Granulocytes: 0.01 10*3/uL (ref 0.00–0.07)
Basophils Absolute: 0 10*3/uL (ref 0.0–0.1)
Basophils Relative: 1 %
Eosinophils Absolute: 0.2 10*3/uL (ref 0.0–0.5)
Eosinophils Relative: 5 %
HCT: 41.6 % (ref 36.0–46.0)
Hemoglobin: 12.9 g/dL (ref 12.0–15.0)
Immature Granulocytes: 0 %
Lymphocytes Relative: 34 %
Lymphs Abs: 1.6 10*3/uL (ref 0.7–4.0)
MCH: 27.7 pg (ref 26.0–34.0)
MCHC: 31 g/dL (ref 30.0–36.0)
MCV: 89.5 fL (ref 80.0–100.0)
Monocytes Absolute: 0.5 10*3/uL (ref 0.1–1.0)
Monocytes Relative: 10 %
Neutro Abs: 2.4 10*3/uL (ref 1.7–7.7)
Neutrophils Relative %: 50 %
Platelets: 228 10*3/uL (ref 150–400)
RBC: 4.65 MIL/uL (ref 3.87–5.11)
RDW: 13.2 % (ref 11.5–15.5)
WBC: 4.7 10*3/uL (ref 4.0–10.5)
nRBC: 0 % (ref 0.0–0.2)

## 2019-04-01 LAB — GLUCOSE, CAPILLARY
Glucose-Capillary: 95 mg/dL (ref 70–99)
Glucose-Capillary: 140 mg/dL — ABNORMAL HIGH (ref 70–99)
Glucose-Capillary: 125 mg/dL — ABNORMAL HIGH (ref 70–99)
Glucose-Capillary: 133 mg/dL — ABNORMAL HIGH (ref 70–99)
Glucose-Capillary: 133 mg/dL — ABNORMAL HIGH (ref 70–99)

## 2019-04-01 LAB — ECHOCARDIOGRAM COMPLETE
Height: 66 in
Weight: 3246.4 oz

## 2019-04-01 LAB — BASIC METABOLIC PANEL
Anion gap: 9 (ref 5–15)
BUN: 7 mg/dL — ABNORMAL LOW (ref 8–23)
CO2: 28 mmol/L (ref 22–32)
Calcium: 8.7 mg/dL — ABNORMAL LOW (ref 8.9–10.3)
Chloride: 102 mmol/L (ref 98–111)
Creatinine, Ser: 0.84 mg/dL (ref 0.44–1.00)
GFR calc Af Amer: >60 mL/min (ref >60)
GFR calc non Af Amer: >60 mL/min (ref >60)
Glucose, Bld: 180 mg/dL — ABNORMAL HIGH (ref 70–99)
Potassium: 3.6 mmol/L (ref 3.5–5.1)
Sodium: 139 mmol/L (ref 135–145)

## 2019-04-01 LAB — ABO/RH: ABO/RH(D): O POS

## 2019-04-01 LAB — TYPE AND SCREEN
ABO/RH(D): O POS
Antibody Screen: NEGATIVE

## 2019-04-01 LAB — HEPARIN LEVEL (UNFRACTIONATED)
Heparin Unfractionated: 0.66 IU/mL (ref 0.30–0.70)
Heparin Unfractionated: 0.71 IU/mL — ABNORMAL HIGH (ref 0.30–0.70)

## 2019-04-01 MED ORDER — INSULIN ASPART 100 UNIT/ML ~~LOC~~ SOLN
0.0000 [IU] | Freq: Three times a day (TID) | SUBCUTANEOUS | Status: DC
  Administered 2019-04-01 (×3): 1 [IU] via SUBCUTANEOUS
  Administered 2019-04-02: 2 [IU] via SUBCUTANEOUS
  Administered 2019-04-03: 1 [IU] via SUBCUTANEOUS
  Administered 2019-04-04 – 2019-04-05 (×2): 2 [IU] via SUBCUTANEOUS
  Administered 2019-04-05: 1 [IU] via SUBCUTANEOUS

## 2019-04-01 MED ORDER — SODIUM CHLORIDE 0.9 % IV SOLN: 250.0000 mL | INTRAVENOUS | Status: DC | PRN

## 2019-04-01 MED ORDER — TRAZODONE HCL 50 MG PO TABS
50.0000 mg | ORAL_TABLET | Freq: Every evening | ORAL | Status: DC | PRN
  Administered 2019-04-01 – 2019-04-04 (×4): 100 mg via ORAL
  Filled 2019-04-01: qty 1
  Filled 2019-04-01: qty 2
  Filled 2019-04-01: qty 1
  Filled 2019-04-01 (×2): qty 2

## 2019-04-01 MED ORDER — ONDANSETRON HCL 4 MG/2ML IJ SOLN
4.0000 mg | Freq: Four times a day (QID) | INTRAMUSCULAR | Status: DC | PRN
  Administered 2019-04-03 – 2019-04-05 (×2): 4 mg via INTRAVENOUS
  Filled 2019-04-01 (×2): qty 2

## 2019-04-01 MED ORDER — SODIUM CHLORIDE 0.9% FLUSH
3.0000 mL | Freq: Two times a day (BID) | INTRAVENOUS | Status: DC
  Administered 2019-04-01 – 2019-04-05 (×6): 3 mL via INTRAVENOUS

## 2019-04-01 MED ORDER — MIRABEGRON ER 50 MG PO TB24
50.0000 mg | ORAL_TABLET | Freq: Every day | ORAL | Status: DC
  Administered 2019-04-01 – 2019-04-04 (×5): 50 mg via ORAL
  Filled 2019-04-01 (×6): qty 1

## 2019-04-01 MED ORDER — INSULIN ASPART 100 UNIT/ML ~~LOC~~ SOLN: 0.0000 [IU] | Freq: Every day | SUBCUTANEOUS | Status: DC

## 2019-04-01 MED ORDER — LEVOTHYROXINE SODIUM 25 MCG PO TABS
25.0000 ug | ORAL_TABLET | Freq: Every day | ORAL | Status: DC
  Administered 2019-04-01 – 2019-04-05 (×5): 25 ug via ORAL
  Filled 2019-04-01 (×5): qty 1

## 2019-04-01 MED ORDER — DICYCLOMINE HCL 20 MG PO TABS: 20.0000 mg | ORAL_TABLET | Freq: Four times a day (QID) | ORAL | Status: DC | PRN

## 2019-04-01 MED ORDER — APIXABAN 5 MG PO TABS
10.0000 mg | ORAL_TABLET | Freq: Two times a day (BID) | ORAL | Status: DC
  Administered 2019-04-01 – 2019-04-02 (×2): 10 mg via ORAL
  Filled 2019-04-01 (×2): qty 2

## 2019-04-01 MED ORDER — ONDANSETRON HCL 4 MG/2ML IJ SOLN
4.0000 mg | INTRAMUSCULAR | Status: AC
  Administered 2019-04-01 (×4): 4 mg via INTRAVENOUS
  Filled 2019-04-01 (×3): qty 2

## 2019-04-01 MED ORDER — BENZONATATE 100 MG PO CAPS
100.0000 mg | ORAL_CAPSULE | Freq: Two times a day (BID) | ORAL | Status: DC
  Administered 2019-04-01 – 2019-04-05 (×9): 100 mg via ORAL
  Filled 2019-04-01 (×9): qty 1

## 2019-04-01 MED ORDER — MORPHINE SULFATE (PF) 4 MG/ML IV SOLN: 3.0000 mg | INTRAVENOUS | Status: DC | PRN

## 2019-04-01 MED ORDER — APIXABAN 5 MG PO TABS: 5.0000 mg | ORAL_TABLET | Freq: Two times a day (BID) | ORAL | Status: DC

## 2019-04-01 MED ORDER — ALBUTEROL SULFATE (2.5 MG/3ML) 0.083% IN NEBU
3.0000 mL | INHALATION_SOLUTION | Freq: Two times a day (BID) | RESPIRATORY_TRACT | Status: DC
  Administered 2019-04-01 – 2019-04-03 (×5): 3 mL via RESPIRATORY_TRACT
  Filled 2019-04-01 (×5): qty 3

## 2019-04-01 MED ORDER — LORATADINE 10 MG PO TABS
10.0000 mg | ORAL_TABLET | Freq: Every day | ORAL | Status: DC
  Administered 2019-04-01 – 2019-04-05 (×5): 10 mg via ORAL
  Filled 2019-04-01 (×5): qty 1

## 2019-04-01 MED ORDER — CLONAZEPAM 0.5 MG PO TABS: 0.5000 mg | ORAL_TABLET | Freq: Every evening | ORAL | Status: DC | PRN

## 2019-04-01 MED ORDER — HYDROCODONE-ACETAMINOPHEN 5-325 MG PO TABS
1.0000 | ORAL_TABLET | Freq: Four times a day (QID) | ORAL | Status: DC | PRN
  Administered 2019-04-01 (×2): 1 via ORAL
  Administered 2019-04-02: 2 via ORAL
  Administered 2019-04-03 – 2019-04-05 (×3): 1 via ORAL
  Filled 2019-04-01 (×5): qty 1
  Filled 2019-04-01 (×2): qty 2

## 2019-04-01 MED ORDER — ACETAMINOPHEN 325 MG PO TABS
650.0000 mg | ORAL_TABLET | Freq: Four times a day (QID) | ORAL | Status: DC | PRN
  Administered 2019-04-01 – 2019-04-04 (×5): 650 mg via ORAL
  Filled 2019-04-01 (×6): qty 2

## 2019-04-01 MED ORDER — ONDANSETRON HCL 4 MG/2ML IJ SOLN: 4.0000 mg | Freq: Four times a day (QID) | INTRAMUSCULAR | Status: DC | PRN

## 2019-04-01 MED ORDER — ONDANSETRON HCL 4 MG PO TABS
4.0000 mg | ORAL_TABLET | Freq: Four times a day (QID) | ORAL | Status: DC | PRN
  Administered 2019-04-04: 4 mg via ORAL
  Filled 2019-04-01 (×2): qty 1

## 2019-04-01 MED ORDER — SODIUM CHLORIDE 0.9% FLUSH
3.0000 mL | Freq: Two times a day (BID) | INTRAVENOUS | Status: DC
  Administered 2019-04-01 – 2019-04-05 (×8): 3 mL via INTRAVENOUS

## 2019-04-01 MED ORDER — POLYETHYLENE GLYCOL 3350 17 G PO PACK: 17.0000 g | PACK | Freq: Every day | ORAL | Status: DC | PRN

## 2019-04-01 MED ORDER — MOMETASONE FURO-FORMOTEROL FUM 100-5 MCG/ACT IN AERO
2.0000 | INHALATION_SPRAY | Freq: Two times a day (BID) | RESPIRATORY_TRACT | Status: DC
  Administered 2019-04-01 – 2019-04-05 (×8): 2 via RESPIRATORY_TRACT
  Filled 2019-04-01: qty 8.8

## 2019-04-01 MED ORDER — VORTIOXETINE HBR 20 MG PO TABS
20.0000 mg | ORAL_TABLET | Freq: Every day | ORAL | Status: DC
  Administered 2019-04-01 – 2019-04-05 (×5): 20 mg via ORAL
  Filled 2019-04-01 (×5): qty 1

## 2019-04-01 MED ORDER — SODIUM CHLORIDE 0.9% FLUSH: 3.0000 mL | INTRAVENOUS | Status: DC | PRN

## 2019-04-01 MED ORDER — PROPRANOLOL HCL 60 MG PO TABS
60.0000 mg | ORAL_TABLET | Freq: Two times a day (BID) | ORAL | Status: DC
  Administered 2019-04-01 – 2019-04-05 (×9): 60 mg via ORAL
  Filled 2019-04-01 (×10): qty 1

## 2019-04-01 MED ORDER — IOHEXOL 350 MG/ML SOLN
100.0000 mL | Freq: Once | INTRAVENOUS | Status: AC | PRN
  Administered 2019-04-01: 100 mL via INTRAVENOUS

## 2019-04-01 MED ORDER — ACETAMINOPHEN 650 MG RE SUPP: 650.0000 mg | Freq: Four times a day (QID) | RECTAL | Status: DC | PRN

## 2019-04-01 MED ORDER — SIMETHICONE 80 MG PO CHEW
80.0000 mg | CHEWABLE_TABLET | Freq: Four times a day (QID) | ORAL | Status: DC
  Administered 2019-04-01 – 2019-04-05 (×18): 80 mg via ORAL
  Filled 2019-04-01 (×18): qty 1

## 2019-04-01 MED ORDER — DICLOFENAC EPOLAMINE 1.3 % TD PTCH
1.0000 | MEDICATED_PATCH | Freq: Every day | TRANSDERMAL | Status: DC
  Administered 2019-04-01 – 2019-04-05 (×5): 1 via TRANSDERMAL
  Filled 2019-04-01 (×5): qty 1

## 2019-04-01 MED ORDER — ALBUTEROL SULFATE (2.5 MG/3ML) 0.083% IN NEBU
3.0000 mL | INHALATION_SOLUTION | Freq: Four times a day (QID) | RESPIRATORY_TRACT | Status: DC
  Filled 2019-04-01: qty 3

## 2019-04-01 NOTE — Progress Notes (Signed)
   04/01/19 1200  Clinical Encounter Type  Visited With Patient  Visit Type Initial;Spiritual support;Other (Comment)  Referral From Nurse  Consult/Referral To Chaplain  Spiritual Encounters  Spiritual Needs Emotional;Prayer;Literature  Stress Factors  Patient Stress Factors Health changes;Family relationships   Responded to spiritual care consult. Pt was alert and talkative. She mentioned that she was still grieving the recent loss of her Husband. She also stated that she was very impressed with Mikki Santee Hamilton's care the last year. And, she stated that she was very thankful for the recent visit from another doctor friend in the Hospital. She was wanting information about the AD. I gave her a copy and she stated that she was needing time to process who she thinks might be best fit to honor her health care wishes. She said she needs to pray about her next step on filling out the AD. Pt was happy that Chaplain visited and stayed with her for a bit. I offered spiritual care with words of comfort, ministry of presence, empathic listening, and prayer. Chaplain available as needed.  Chaplain Fidel Levy  [phone]

## 2019-04-01 NOTE — Progress Notes (Signed)
ANTICOAGULATION CONSULT NOTE - Follow Up Consult  Pharmacy Consult for heparin Indication: h/o VTE w/ possible acute PE  Labs: Recent Labs    03/31/19 1503 03/31/19 1528 03/31/19 1611 03/31/19 1728 03/31/19 2230 04/01/19 0407  HGB 13.6  --   --   --   --  12.9  HCT 42.5  --   --   --   --  41.6  PLT 248  --   --   --   --  228  APTT  --   --   --   --  32  --   HEPARINUNFRC  --   --   --   --  <0.10* 0.66  CREATININE  --   --  0.75  --   --  0.84  TROPONINIHS  --  3  --  4  --   --     Assessment/Plan:  72yo female therapeutic on heparin with initial dosing for possible PE; still awaiting CT. Will continue gtt at current rate and confirm stable with additional level.   Wynona Neat, PharmD, BCPS  04/01/2019,5:07 AM

## 2019-04-01 NOTE — Progress Notes (Signed)
Pt c/o pressure and burning during urination MD made aware.

## 2019-04-01 NOTE — Progress Notes (Addendum)
[place] for heparin Indication: hx VTE, r/o acute PE  Heparin Dosing Weight: 79.1 kg  Labs: Recent Labs    03/31/19 1503 03/31/19 1611 03/31/19 2230 04/01/19 0407 04/01/19 1044  HGB 13.6  --   --  12.9  --   HCT 42.5  --   --  41.6  --   PLT 248  --   --  228  --   APTT  --   --  32  --   --   HEPARINUNFRC  --   --  <0.10* 0.66 0.71*  CREATININE  --  0.75  --  0.84  --     Assessment: 58 yof with history of recurrent PE (2013, 2016 on low-dose Eliquis with inconsistent use, concern for acute PE. Plans are for CTA -heparin level= 0.71   Goal of Therapy:  Heparin level 0.3-0.7 units/ml aPTT 66-102 seconds Monitor platelets by anticoagulation protocol: Yes   Plan:  -Decrease heparin to 1350/hr  -Daily heparin level/CBC  Hildred Laser, PharmD Clinical Pharmacist **Pharmacist phone directory can now be found on [email or web address] ([email or web address]).  Listed under [place].   Addendum -Change to xarelto -Will r/o PE and history of reoccurrence will need VTE dosing -apixaban 10mg  po bid then 5mg  po bid starting 7/23  Hildred Laser, PharmD Clinical Pharmacist **Pharmacist phone directory can now be found on [email or web address] ([email or web address]).  Listed under [place].

## 2019-04-01 NOTE — Progress Notes (Signed)
Echocardiogram 2D Echocardiogram has been performed.  Oneal Deputy Calynn Ferrero 04/01/2019, 12:58 PM

## 2019-04-01 NOTE — ED Notes (Signed)
ED TO INPATIENT HANDOFF REPORT  ED Nurse Name and Phone #: Abigail Butts [phone]  S Name/Age/Gender Hayley [name] 73 y.o. female Room/Bed: 040C/040C  Code Status   Code Status: Prior  Home/SNF/Other Home Patient oriented to: self, place, time and situation Is this baseline? Yes   Triage Complete: Triage complete  Chief Complaint Chest pain  Triage Note Pt from home via ems; c/o chest discomfort since 8 am, pressure; no radiation; central; some sob; clear with ems; also c/o HA and near syncope; some cough, denies sick contacts; also reports diarrhea and loss of appetite x 2 weeks; 324 asa pta; refused nitro with ems; Hx stroke, DM; pt also endorses generalized weakness and increased urinary frequency  146/114 P 66 NS RR 22 94% RA CBG 99 T 59F    Allergies Allergies  Allergen Reactions  . Bee Venom Anaphylaxis  . Tape Rash    Rash - only use paper tape  . Etodolac Diarrhea and Nausea And Vomiting  . Morphine Other (See Comments)    Hallucinations  . Other Itching and Swelling    Bee venom  . Poison Ivy Extract  [Poison Ivy Extract] Itching and Swelling  . Propoxyphene Nausea And Vomiting  . Propoxyphene N-Acetaminophen Nausea And Vomiting  . Penicillins Rash    Has patient had a PCN reaction causing immediate rash, facial/tongue/throat swelling, SOB or lightheadedness with hypotension: Yes Has patient had a PCN reaction causing severe rash involving mucus membranes or skin necrosis: No Has patient had a PCN reaction that required hospitalization No Has patient had a PCN reaction occurring within the last 10 years: No If all of the above answers are "NO", then may proceed with Cephalosporin use.     Level of Care/Admitting Diagnosis ED Disposition    ED Disposition Condition [facility] Area: [place] [[id number]]  Level of Care: Telemetry Cardiac [103]  I expect the patient will be discharged within 24 hours: No (not a candidate for  5C-Observation unit)  Covid Evaluation: Asymptomatic Screening Protocol (No Symptoms)  Diagnosis: Chest pain [702637]  Admitting Physician: Vianne Bulls [[id number]]  Attending Physician: Vianne Bulls [[id number]]  PT Class (Do Not Modify): Observation [104]  PT Acc Code (Do Not Modify): Observation [10022]       B Medical/Surgery History Past Medical History:  Diagnosis Date  . Anemia   . Anxiety   . Bronchitis    hx of   . Clotting disorder ([place]) 02-17-14   S/p appendectomy-developed Pulmonary emboli-tx. warfarin-d/c 1 yr ago.  . Colon polyps   . Depression   . Diabetes mellitus without complication ([place])    "borderline"- oral med  . Disorder of vocal cords    spasmotic dysphonia ,02-17-14 has" whispery voice-low tone"  . Hyperthyroidism   . Kidney stones   . Migraines   . Nausea Nov 15, 2014   cycle of migraine headaches and nausea  . Neuromuscular disorder ([place])    hands and throat"spasmodic dysphonia", tremors - thimb and forefinger   . Obesity, Class III, BMI 40-49.9 (morbid obesity) ([place]) 04/27/2010   02-17-14 reports some weight loss- intentional  . Osteoarthritis    in back(chronic pain)  . Pelvic pain    02-17-14 "states thinks its back related on left groin"  . Pulmonary emboli [facility])    s/p Appendectomy '13 [facility]  . Reflux   . Right knee pain   . Sleep apnea    cpap - settings at 4   .  Toxic goiter    Past Surgical History:  Procedure Laterality Date  . ABDOMINAL HYSTERECTOMY    . APPENDECTOMY     3'13 [facility] s/p developed  Pulmonary Emboli  . BOTOX INJECTION     for migranes  . CATARACT EXTRACTION, BILATERAL Bilateral 03/2013   . CHOLECYSTECTOMY    . HAND SURGERY  2004   both hands  . KIDNEY STONE SURGERY    . KNEE ARTHROSCOPY  1998   right  . LAPAROSCOPIC APPENDECTOMY  11/27/2011   Procedure: APPENDECTOMY LAPAROSCOPIC;  Surgeon: Adin Hector, MD;  Location: [facility] ORS;  Service: General;  Laterality: N/A;  . SPINAL CORD STIMULATOR IMPLANT  06/04/2016    Dr. Alfonse Ras at [facility]  . TOTAL KNEE ARTHROPLASTY Right 12/06/2014   Procedure: RIGHT TOTAL KNEE ARTHROPLASTY;  Surgeon: Gaynelle Arabian, MD;  Location: [facility];  Service: Orthopedics;  Laterality: Right;  . TOTAL KNEE REVISION Left 02/24/2014   Procedure: LEFT TOTAL KNEE REVISION;  Surgeon: Gearlean Alf, MD;  Location: [facility];  Service: Orthopedics;  Laterality: Left;  . vocal cord surgery  02-17-14   01-12-14 done in [place] [facility](s/p selective denervation/reinnervation recurrent laryngeal nerve surgery)  . zenkers diverticulum       A IV Location/Drains/Wounds Patient Lines/Drains/Airways Status   Active Line/Drains/Airways    Name:   Placement date:   Placement time:   Site:   Days:   Peripheral IV 03/31/19 Anterior;Left Hand   03/31/19    -    Hand   1   Peripheral IV 04/01/19 Right Hand   04/01/19    0033    Hand   less than 1          Intake/Output Last 24 hours No intake or output data in the 24 hours ending 04/01/19 0102  Labs/Imaging Results for orders placed or performed during the hospital encounter of 03/31/19 (from the past 48 hour(s))  CBC with Differential     Status: None   Collection Time: 03/31/19  3:03 PM  Result Value Ref Range   WBC 5.0 4.0 - 10.5 K/uL   RBC 4.88 3.87 - 5.11 MIL/uL   Hemoglobin 13.6 12.0 - 15.0 g/dL   HCT 42.5 36.0 - 46.0 %   MCV 87.1 80.0 - 100.0 fL   MCH 27.9 26.0 - 34.0 pg   MCHC 32.0 30.0 - 36.0 g/dL   RDW 13.2 11.5 - 15.5 %   Platelets 248 150 - 400 K/uL   nRBC 0.0 0.0 - 0.2 %   Neutrophils Relative % 60 %   Neutro Abs 3.0 1.7 - 7.7 K/uL   Lymphocytes Relative 25 %   Lymphs Abs 1.3 0.7 - 4.0 K/uL   Monocytes Relative 11 %   Monocytes Absolute 0.6 0.1 - 1.0 K/uL   Eosinophils Relative 4 %   Eosinophils Absolute 0.2 0.0 - 0.5 K/uL   Basophils Relative 0 %   Basophils Absolute 0.0 0.0 - 0.1 K/uL   Immature Granulocytes 0 %   Abs Immature Granulocytes 0.01 0.00 - 0.07 K/uL    Comment: Performed at [facility], [address], [place], [place] [address]  Troponin I (High Sensitivity)     Status: None   Collection Time: 03/31/19  3:28 PM  Result Value Ref Range   Troponin I (High Sensitivity) 3 <18 ng/L    Comment: (NOTE) Elevated high sensitivity troponin I (hsTnI) values and significant  changes across serial measurements may suggest ACS but  many other  chronic and acute conditions are known to elevate hsTnI results.  Refer to the "Links" section for chest pain algorithms and additional  guidance. Performed at [facility], [place] [address], [place], [place] [address]   Brain natriuretic peptide     Status: None   Collection Time: 03/31/19  3:31 PM  Result Value Ref Range   B Natriuretic Peptide 94.9 0.0 - 100.0 pg/mL    Comment: Performed at [facility] [address], [place], [place] [address]  Urinalysis, Routine w reflex microscopic     Status: None   Collection Time: 03/31/19  3:51 PM  Result Value Ref Range   Color, Urine YELLOW YELLOW   APPearance CLEAR CLEAR   Specific Gravity, Urine 1.008 1.005 - 1.030   pH 7.0 5.0 - 8.0   Glucose, UA NEGATIVE NEGATIVE mg/dL   Hgb urine dipstick NEGATIVE NEGATIVE   Bilirubin Urine NEGATIVE NEGATIVE   Ketones, ur NEGATIVE NEGATIVE mg/dL   Protein, ur NEGATIVE NEGATIVE mg/dL   Nitrite NEGATIVE NEGATIVE   Leukocytes,Ua NEGATIVE NEGATIVE    Comment: Performed at [facility] [address], [place], [place] [address]  Comprehensive metabolic panel     Status: Abnormal   Collection Time: 03/31/19  4:11 PM  Result Value Ref Range   Sodium 141 135 - 145 mmol/L   Potassium 4.2 3.5 - 5.1 mmol/L   Chloride 104 98 - 111 mmol/L   CO2 27 22 - 32 mmol/L   Glucose, Bld 92 70 - 99 mg/dL   BUN 6 (L) 8 - 23 mg/dL   Creatinine, Ser 0.75 0.44 - 1.00 mg/dL   Calcium 8.9 8.9 - 10.3 mg/dL   Total Protein 6.9 6.5 - 8.1 g/dL   Albumin 3.7 3.5 - 5.0 g/dL   AST 15 15 - 41 U/L   ALT 8 0 - 44 U/L   Alkaline Phosphatase 54 38 - 126  U/L   Total Bilirubin 0.8 0.3 - 1.2 mg/dL   GFR calc non Af Amer >60 >60 mL/min   GFR calc Af Amer >60 >60 mL/min   Anion gap 10 5 - 15    Comment: Performed at [facility], [place] [address] [place], [place] [address]  Troponin I (High Sensitivity)     Status: None   Collection Time: 03/31/19  5:28 PM  Result Value Ref Range   Troponin I (High Sensitivity) 4 <18 ng/L    Comment: (NOTE) Elevated high sensitivity troponin I (hsTnI) values and significant  changes across serial measurements may suggest ACS but many other  chronic and acute conditions are known to elevate hsTnI results.  Refer to the "Links" section for chest pain algorithms and additional  guidance. Performed at [facility], [place] [address], [place], [place] [address]   Type and screen [place]     Status: None (Preliminary result)   Collection Time: 03/31/19 10:12 PM  Result Value Ref Range   ABO/RH(D) PENDING    Antibody Screen PENDING    Sample Expiration      04/04/2019,2359 Performed at [facility], [place] [address] [place], [place] [address]   SARS Coronavirus 2 (CEPHEID - Performed in [facility] hospital lab), Hosp Order     Status: None   Collection Time: 03/31/19 10:30 PM   Specimen: Nasopharyngeal Swab  Result Value Ref Range   SARS Coronavirus 2 NEGATIVE NEGATIVE    Comment: (NOTE) If  result is NEGATIVE SARS-CoV-2 target nucleic acids are NOT DETECTED. The SARS-CoV-2 RNA is generally detectable in upper and lower  respiratory specimens during the acute phase of infection. The lowest  concentration of SARS-CoV-2 viral copies this assay can detect is 250  copies / mL. A negative result does not preclude SARS-CoV-2 infection  and should not be used as the sole basis for treatment or other  patient management decisions.  A negative result may occur with  improper specimen collection / handling, submission of specimen other  than nasopharyngeal swab, presence of  viral mutation(s) within the  areas targeted by this assay, and inadequate number of viral copies  (<250 copies / mL). A negative result must be combined with clinical  observations, patient history, and epidemiological information. If result is POSITIVE SARS-CoV-2 target nucleic acids are DETECTED. The SARS-CoV-2 RNA is generally detectable in upper and lower  respiratory specimens dur ing the acute phase of infection.  Positive  results are indicative of active infection with SARS-CoV-2.  Clinical  correlation with patient history and other diagnostic information is  necessary to determine patient infection status.  Positive results do  not rule out bacterial infection or co-infection with other viruses. If result is PRESUMPTIVE POSTIVE SARS-CoV-2 nucleic acids MAY BE PRESENT.   A presumptive positive result was obtained on the submitted specimen  and confirmed on repeat testing.  While 2019 novel coronavirus  (SARS-CoV-2) nucleic acids may be present in the submitted sample  additional confirmatory testing may be necessary for epidemiological  and / or clinical management purposes  to differentiate between  SARS-CoV-2 and other Sarbecovirus currently known to infect humans.  If clinically indicated additional testing with an alternate test  methodology [id number]) is advised. The SARS-CoV-2 RNA is generally  detectable in upper and lower respiratory sp ecimens during the acute  phase of infection. The expected result is Negative. Fact Sheet for Patients:  StrictlyIdeas.no Fact Sheet for Healthcare Providers: BankingDealers.co.za This test is not yet approved or cleared by the [place] FDA and has been authorized for detection and/or diagnosis of SARS-CoV-2 by FDA under an Emergency Use Authorization (EUA).  This EUA will remain in effect (meaning this test can be used) for the duration of the COVID-19 declaration under Section  564(b)(1) of the Act, 21 U.S.C. section 360bbb-3(b)(1), unless the authorization is terminated or revoked sooner. Performed at [facility], [place] [address], [place], [place] [address]   Heparin level (unfractionated)     Status: Abnormal   Collection Time: 03/31/19 10:30 PM  Result Value Ref Range   Heparin Unfractionated <0.10 (L) 0.30 - 0.70 IU/mL    Comment: (NOTE) If heparin results are below expected values, and patient dosage has  been confirmed, suggest follow up testing of antithrombin III levels. Performed at [facility], [place] [address], [place], [place] [address]   APTT     Status: None   Collection Time: 03/31/19 10:30 PM  Result Value Ref Range   aPTT 32 24 - 36 seconds    Comment: Performed at [place] [facility] [address], [place], [place] [address]   Ct Abdomen Pelvis W Contrast  Result Date: 03/31/2019 CLINICAL DATA:  Central chest pain.  Abdominal pain, generalized. EXAM: CT ABDOMEN AND PELVIS WITH CONTRAST TECHNIQUE: Multidetector CT imaging of the abdomen and pelvis was performed using the standard protocol following bolus administration of intravenous contrast. CONTRAST:  181mL OMNIPAQUE IOHEXOL 300 MG/ML  SOLN COMPARISON:  11/17/2017 FINDINGS:  Lower chest: Linear scarring in the lung bases. No effusions. Heart is normal size. Hepatobiliary: No focal liver abnormality is seen. Status post cholecystectomy. No biliary dilatation. Pancreas: No focal abnormality or ductal dilatation. Spleen: No focal abnormality.  Normal size. Adrenals/Urinary Tract: No adrenal abnormality. No focal renal abnormality. No stones or hydronephrosis. Urinary bladder is unremarkable. Stomach/Bowel: Diffuse colonic diverticulosis, most pronounced in the descending colon and sigmoid colon. No evidence of active diverticulitis. Stomach and small bowel decompressed, unremarkable. Vascular/Lymphatic: Aortic atherosclerosis. No enlarged abdominal or pelvic lymph nodes. Reproductive:  Prior hysterectomy.  No adnexal masses. Other: No free fluid or free air. Musculoskeletal: No acute bony abnormality. Degenerative disc and facet disease in the lumbar spine. IMPRESSION: No acute findings in the abdomen or pelvis. Colonic diverticulosis.  No changes of active diverticulitis. Aortic atherosclerosis. Bibasilar scarring. Electronically Signed   By: Rolm Baptise M.D.   On: 03/31/2019 18:55   Dg Chest Portable 1 View  Result Date: 03/31/2019 CLINICAL DATA:  Shortness of breath, chest discomfort. EXAM: PORTABLE CHEST 1 VIEW COMPARISON:  Chest radiographs 11/20/2017, 11/19/2017, CT angiography chest 11/17/2017 FINDINGS: Body habitus and abundant apical fat pad likely contribute to the increased hazy opacity in both lung bases. There are chronic interstitial changes and airways thickening similar to prior exams. No acute consolidation, effusion or pneumothorax is evident. Cardiomegaly is similar to prior studies. Degenerative changes are present in the spine and shoulders. Thoracic nerve stimulator is seen. Cardiac monitoring leads overlie the chest. Necklace is noted at the base of the neck. IMPRESSION: Chronic interstitial changes and airways thickening, similar to prior studies. No definite acute cardiopulmonary process. Electronically Signed   By: Lovena Le M.D.   On: 03/31/2019 18:01    Pending Labs Unresulted Labs (From admission, onward)    Start     Ordered   04/02/19 0500  Heparin level (unfractionated)  Daily,   R     03/31/19 2146   04/01/19 0500  CBC  Daily,   R     03/31/19 2136   04/01/19 0430  Heparin level (unfractionated)  Once-Timed,   STAT     03/31/19 2146   Signed and Held  Basic metabolic panel  Tomorrow morning,   R     Signed and Held   Signed and Held  CBC WITH DIFFERENTIAL  Tomorrow morning,   R     Signed and Held          Vitals/Pain Today's Vitals   03/31/19 2045 03/31/19 2100 04/01/19 0000 04/01/19 0029  BP:  103/61 116/73   Pulse: (!) 57  60    Resp: 16  17   Temp:      TempSrc:      SpO2: 93%  91%   Weight:      Height:      PainSc:    8     Isolation Precautions No active isolations  Medications Medications  albuterol (VENTOLIN HFA) 108 (90 Base) MCG/ACT inhaler 2 puff (has no administration in time range)  heparin ADULT infusion 100 units/mL (25000 units/243mL sodium chloride 0.45%) (1,400 Units/hr Intravenous New Bag/Given 04/01/19 0025)  HYDROcodone-acetaminophen (NORCO/VICODIN) 5-325 MG per tablet 1-2 tablet (has no administration in time range)  ondansetron (ZOFRAN) injection 4 mg (has no administration in time range)  morphine 4 MG/ML injection 3 mg (has no administration in time range)  acetaminophen (TYLENOL) tablet 325 mg (325 mg Oral Given 03/31/19 1713)  iohexol (OMNIPAQUE) 300 MG/ML solution 100 mL (100 mLs Intravenous  Contrast Given 03/31/19 1818)  ondansetron (ZOFRAN-ODT) disintegrating tablet 4 mg (4 mg Oral Given 03/31/19 1930)  acetaminophen (TYLENOL) tablet 500 mg (500 mg Oral Given 03/31/19 1930)  heparin bolus via infusion 5,000 Units (5,000 Units Intravenous Bolus from Bag 04/01/19 0027)    Mobility walks     Focused Assessments Pulmonary Assessment Handoff:  Lung sounds:   O2 Device: Nasal Cannula O2 Flow Rate (L/min): 2 L/min      R Recommendations: See Admitting Provider Note  Report given to:   Additional Notes:  covid negative

## 2019-04-01 NOTE — Progress Notes (Signed)
[facility] PROGRESS NOTE  Hayley [name] KDX:[id number] DOB: June 04, 1946 DOA: 03/31/2019 PCP: [name], MD  Assessment/Plan:  1. Hypoxia/ history of PE  Presented with acute-onset of chest discomfort, SOB, and near-syncope. Concern for PE as she has missed Eliquis doses due to running out. EKG without acute ischemic features, high-sensitivity troponin neg x2, and CXR with chronic findings only.Oxygen saturations upper 80's while at rest and low 70's when ambulating short distance in ED. With her hx of recurrent PE, poor adherence with Eliquis, significant hypoxia, and recent calf pain, PE is a primary concern. Today oxygen saturation level 94% on 2L.   -follow CTA -stop heparin -start eliquis -echo to assess LV function -continue home inhalers -ambulate and monitor sats   3. Type II DM serum glucose 180 this am.  A1c was 9.4% in 2018  Managed with glipizide at home, held on admission  -Obtain A1C -SSI -hold glipizide    4. Depression, anxiety  - Stable, continue Trintellix, trazodone, Klonopin    5. Hypothyroidism  - Continue Synthroid    6. Abdominal discomfort, diarrhea complained of feeling "gassy" denies nausea. Ate breakfast -gass ex -monitor    Code Status: full Family Communication: left son message  Disposition Plan: home in am   Consultants:    Procedures:  echo  Antibiotics:    HPI/Subjective: Awake alert no acute distress. Reports feels like breathing better  Objective: Vitals:   04/01/19 0134 04/01/19 0457  BP: 124/78 105/78  Pulse: 60 66  Resp: 18 18  Temp: 98.2 F (36.8 C) 97.8 F (36.6 C)  SpO2: 93% 94%    Intake/Output Summary (Last 24 hours) at 04/01/2019 1328 Last data filed at 04/01/2019 0600 Gross per 24 hour  Intake -  Output 300 ml  Net -300 ml   Filed Weights   03/31/19 1428 04/01/19 0106  Weight: 90.7 kg 92 kg    Exam:   General:  Awake alert  No acute distress  Cardiovascular: RRR no mgr trace LE  edema  Respiratory: normal effort BS diminished but clear no wheeze or crackles  Abdomen: obese soft +BS no guarding or rebounding  Musculoskeletal: joints without swelling/erythema   Data Reviewed: Basic Metabolic Panel: Recent Labs  Lab 03/31/19 1611 04/01/19 0407  NA 141 139  K 4.2 3.6  CL 104 102  CO2 27 28  GLUCOSE 92 180*  BUN 6* 7*  CREATININE 0.75 0.84  CALCIUM 8.9 8.7*   Liver Function Tests: Recent Labs  Lab 03/31/19 1611  AST 15  ALT 8  ALKPHOS 54  BILITOT 0.8  PROT 6.9  ALBUMIN 3.7   No results for input(s): LIPASE, AMYLASE in the last 168 hours. No results for input(s): AMMONIA in the last 168 hours. CBC: Recent Labs  Lab 03/31/19 1503 04/01/19 0407  WBC 5.0 4.7  NEUTROABS 3.0 2.4  HGB 13.6 12.9  HCT 42.5 41.6  MCV 87.1 89.5  PLT 248 228   Cardiac Enzymes: No results for input(s): CKTOTAL, CKMB, CKMBINDEX, TROPONINI in the last 168 hours. BNP (last 3 results) Recent Labs    03/31/19 1531  BNP 94.9    ProBNP (last 3 results) No results for input(s): PROBNP in the last 8760 hours.  CBG: Recent Labs  Lab 04/01/19 0225 04/01/19 0600 04/01/19 1315  GLUCAP 95 140* 125*    Recent Results (from the past 240 hour(s))  SARS Coronavirus 2 (CEPHEID - Performed in [place] hospital lab), Hosp Order     Status: None  Collection Time: 03/31/19 10:30 PM   Specimen: Nasopharyngeal Swab  Result Value Ref Range Status   SARS Coronavirus 2 NEGATIVE NEGATIVE Final    Comment: (NOTE) If result is NEGATIVE SARS-CoV-2 target nucleic acids are NOT DETECTED. The SARS-CoV-2 RNA is generally detectable in upper and lower  respiratory specimens during the acute phase of infection. The lowest  concentration of SARS-CoV-2 viral copies this assay can detect is 250  copies / mL. A negative result does not preclude SARS-CoV-2 infection  and should not be used as the sole basis for treatment or other  patient management decisions.  A negative result  may occur with  improper specimen collection / handling, submission of specimen other  than nasopharyngeal swab, presence of viral mutation(s) within the  areas targeted by this assay, and inadequate number of viral copies  (<250 copies / mL). A negative result must be combined with clinical  observations, patient history, and epidemiological information. If result is POSITIVE SARS-CoV-2 target nucleic acids are DETECTED. The SARS-CoV-2 RNA is generally detectable in upper and lower  respiratory specimens dur ing the acute phase of infection.  Positive  results are indicative of active infection with SARS-CoV-2.  Clinical  correlation with patient history and other diagnostic information is  necessary to determine patient infection status.  Positive results do  not rule out bacterial infection or co-infection with other viruses. If result is PRESUMPTIVE POSTIVE SARS-CoV-2 nucleic acids MAY BE PRESENT.   A presumptive positive result was obtained on the submitted specimen  and confirmed on repeat testing.  While 2019 novel coronavirus  (SARS-CoV-2) nucleic acids may be present in the submitted sample  additional confirmatory testing may be necessary for epidemiological  and / or clinical management purposes  to differentiate between  SARS-CoV-2 and other Sarbecovirus currently known to infect humans.  If clinically indicated additional testing with an alternate test  methodology [phone]) is advised. The SARS-CoV-2 RNA is generally  detectable in upper and lower respiratory sp ecimens during the acute  phase of infection. The expected result is Negative. Fact Sheet for Patients:  StrictlyIdeas.no Fact Sheet for Healthcare Providers: BankingDealers.co.za This test is not yet approved or cleared by the [place] FDA and has been authorized for detection and/or diagnosis of SARS-CoV-2 by FDA under an Emergency Use Authorization (EUA).   This EUA will remain in effect (meaning this test can be used) for the duration of the COVID-19 declaration under Section 564(b)(1) of the Act, 21 U.S.C. section 360bbb-3(b)(1), unless the authorization is terminated or revoked sooner. Performed at [facility], [place] [address], [place], [place] [address]      Studies: Ct Abdomen Pelvis W Contrast  Result Date: 03/31/2019 CLINICAL DATA:  Central chest pain.  Abdominal pain, generalized. EXAM: CT ABDOMEN AND PELVIS WITH CONTRAST TECHNIQUE: Multidetector CT imaging of the abdomen and pelvis was performed using the standard protocol following bolus administration of intravenous contrast. CONTRAST:  118mL OMNIPAQUE IOHEXOL 300 MG/ML  SOLN COMPARISON:  11/17/2017 FINDINGS: Lower chest: Linear scarring in the lung bases. No effusions. Heart is normal size. Hepatobiliary: No focal liver abnormality is seen. Status post cholecystectomy. No biliary dilatation. Pancreas: No focal abnormality or ductal dilatation. Spleen: No focal abnormality.  Normal size. Adrenals/Urinary Tract: No adrenal abnormality. No focal renal abnormality. No stones or hydronephrosis. Urinary bladder is unremarkable. Stomach/Bowel: Diffuse colonic diverticulosis, most pronounced in the descending colon and sigmoid colon. No evidence of active diverticulitis. Stomach and small bowel decompressed, unremarkable. Vascular/Lymphatic: Aortic atherosclerosis.  No enlarged abdominal or pelvic lymph nodes. Reproductive: Prior hysterectomy.  No adnexal masses. Other: No free fluid or free air. Musculoskeletal: No acute bony abnormality. Degenerative disc and facet disease in the lumbar spine. IMPRESSION: No acute findings in the abdomen or pelvis. Colonic diverticulosis.  No changes of active diverticulitis. Aortic atherosclerosis. Bibasilar scarring. Electronically Signed   By: Rolm Baptise M.D.   On: 03/31/2019 18:55   Dg Chest Portable 1 View  Result Date: 03/31/2019 CLINICAL DATA:   Shortness of breath, chest discomfort. EXAM: PORTABLE CHEST 1 VIEW COMPARISON:  Chest radiographs 11/20/2017, 11/19/2017, CT angiography chest 11/17/2017 FINDINGS: Body habitus and abundant apical fat pad likely contribute to the increased hazy opacity in both lung bases. There are chronic interstitial changes and airways thickening similar to prior exams. No acute consolidation, effusion or pneumothorax is evident. Cardiomegaly is similar to prior studies. Degenerative changes are present in the spine and shoulders. Thoracic nerve stimulator is seen. Cardiac monitoring leads overlie the chest. Necklace is noted at the base of the neck. IMPRESSION: Chronic interstitial changes and airways thickening, similar to prior studies. No definite acute cardiopulmonary process. Electronically Signed   By: Lovena Le M.D.   On: 03/31/2019 18:01    Scheduled Meds: . albuterol  3 mL Inhalation QID  . benzonatate  100 mg Oral BID  . insulin aspart  0-5 Units Subcutaneous QHS  . insulin aspart  0-9 Units Subcutaneous TID WC  . levothyroxine  25 mcg Oral Q0600  . loratadine  10 mg Oral Daily  . mirabegron ER  50 mg Oral QHS  . mometasone-formoterol  2 puff Inhalation BID  . ondansetron (ZOFRAN) IV  4 mg Intravenous Q4H  . propranolol  60 mg Oral BID  . simethicone  80 mg Oral QID  . sodium chloride flush  3 mL Intravenous Q12H  . sodium chloride flush  3 mL Intravenous Q12H  . vortioxetine HBr  20 mg Oral Daily   Continuous Infusions: . sodium chloride    . heparin 1,350 Units/hr (04/01/19 1313)    Principal Problem:   Acute respiratory failure with hypoxemia (HCC) Active Problems:   History of pulmonary embolism   Chest pain   Diabetes mellitus type II, non insulin dependent (HCC)   OSA on CPAP   Essential hypertension   Hypothyroidism   MDD (major depressive episode), single episode, severe, no psychosis ([place])   Pain syndrome, chronic    Time spent: 69 minutes    [place]  NP  [facility]  If 7PM-7AM, please contact night-coverage at [email or web address], password [facility] 04/01/2019, 1:28 PM  LOS: 0 days

## 2019-04-02 LAB — CBC
HCT: 41 % (ref 36.0–46.0)
Hemoglobin: 12.6 g/dL (ref 12.0–15.0)
MCH: 27.4 pg (ref 26.0–34.0)
MCHC: 30.7 g/dL (ref 30.0–36.0)
MCV: 89.1 fL (ref 80.0–100.0)
Platelets: 211 10*3/uL (ref 150–400)
RBC: 4.6 MIL/uL (ref 3.87–5.11)
RDW: 13.2 % (ref 11.5–15.5)
WBC: 4.7 10*3/uL (ref 4.0–10.5)
nRBC: 0 % (ref 0.0–0.2)

## 2019-04-02 LAB — GLUCOSE, CAPILLARY
Glucose-Capillary: 105 mg/dL — ABNORMAL HIGH (ref 70–99)
Glucose-Capillary: 110 mg/dL — ABNORMAL HIGH (ref 70–99)
Glucose-Capillary: 165 mg/dL — ABNORMAL HIGH (ref 70–99)
Glucose-Capillary: 146 mg/dL — ABNORMAL HIGH (ref 70–99)

## 2019-04-02 LAB — HEMOGLOBIN A1C
Hgb A1c MFr Bld: 6.4 % — ABNORMAL HIGH (ref 4.8–5.6)
Mean Plasma Glucose: 136.98 mg/dL

## 2019-04-02 MED ORDER — KETOROLAC TROMETHAMINE 15 MG/ML IJ SOLN
15.0000 mg | Freq: Once | INTRAMUSCULAR | Status: AC
  Administered 2019-04-02: 15 mg via INTRAVENOUS
  Filled 2019-04-02: qty 1

## 2019-04-02 MED ORDER — METOCLOPRAMIDE HCL 5 MG/ML IJ SOLN
5.0000 mg | Freq: Once | INTRAMUSCULAR | Status: AC
  Administered 2019-04-02: 5 mg via INTRAVENOUS
  Filled 2019-04-02: qty 2

## 2019-04-02 MED ORDER — DIPHENHYDRAMINE HCL 50 MG/ML IJ SOLN
12.5000 mg | Freq: Once | INTRAMUSCULAR | Status: AC
  Administered 2019-04-02: 12.5 mg via INTRAVENOUS
  Filled 2019-04-02: qty 1

## 2019-04-02 MED ORDER — FLUCONAZOLE 200 MG PO TABS
200.0000 mg | ORAL_TABLET | Freq: Once | ORAL | Status: AC
  Administered 2019-04-02: 200 mg via ORAL
  Filled 2019-04-02: qty 1

## 2019-04-02 MED ORDER — PHENAZOPYRIDINE HCL 100 MG PO TABS
100.0000 mg | ORAL_TABLET | Freq: Three times a day (TID) | ORAL | Status: AC
  Administered 2019-04-02 – 2019-04-04 (×6): 100 mg via ORAL
  Filled 2019-04-02 (×6): qty 1

## 2019-04-02 MED ORDER — APIXABAN 5 MG PO TABS
5.0000 mg | ORAL_TABLET | Freq: Two times a day (BID) | ORAL | Status: DC
  Administered 2019-04-03 – 2019-04-05 (×5): 5 mg via ORAL
  Filled 2019-04-02 (×6): qty 1

## 2019-04-02 NOTE — Progress Notes (Signed)
Patient declines to wear CPAP HS.

## 2019-04-02 NOTE — Clinical Social Work Note (Signed)
Sent in requests to check Eliquis cost.  Dayton Scrape, [place]

## 2019-04-02 NOTE — Care Management (Signed)
Per [place] w/[facility] Co-pay amount for Eliquis 5mg  twice a day is $3.90 for a 30 day supply 60 for 30.  No PA required Retail pharmacy: [facility]

## 2019-04-02 NOTE — Progress Notes (Signed)
ANTICOAGULATION CONSULT NOTE  Pharmacy Consult for heparin Indication: hx VTE, r/o acute PE  Heparin Dosing Weight: 79.1 kg  Labs: Recent Labs    03/31/19 1503 03/31/19 1611 03/31/19 2230 04/01/19 0407 04/01/19 1044 04/02/19 0500  HGB 13.6  --   --  12.9  --  12.6  HCT 42.5  --   --  41.6  --  41.0  PLT 248  --   --  228  --  211  APTT  --   --  32  --   --   --   HEPARINUNFRC  --   --  <0.10* 0.66 0.71*  --   CREATININE  --  0.75  --  0.84  --   --     Assessment: 28 yof with history of recurrent PE (2013, 2016 on low-dose Eliquis with inconsistent use, concern for acute PE. Pharmacy dosing Eliquis. -CTA negative for PA   Goal of Therapy:  Heparin level 0.3-0.7 units/ml aPTT 66-102 seconds Monitor platelets by anticoagulation protocol: Yes   Plan:  -Change apixaban to 5mg  po bid since no acute VTE -Will provide patient education  Hildred Laser, PharmD Clinical Pharmacist **Pharmacist phone directory can now be found on [email or web address] ([email or web address]).  Listed under [place].

## 2019-04-02 NOTE — Discharge Instructions (Signed)
Information on my medicine - ELIQUIS (apixaban)  Why was Eliquis prescribed for you? Eliquis was prescribed to treat blood clots that may have been found in the veins of your legs (deep vein thrombosis) or in your lungs (pulmonary embolism) and to reduce the risk of them occurring again.  What do You need to know about Eliquis ? The to ONE 5 mg tablet taken TWICE daily.  Eliquis may be taken with or without food.   Try to take the dose about the same time in the morning and in the evening. If you have difficulty swallowing the tablet whole please discuss with your pharmacist how to take the medication safely.  Take Eliquis exactly as prescribed and DO NOT stop taking Eliquis without talking to the doctor who prescribed the medication.  Stopping may increase your risk of developing a new blood clot.  Refill your prescription before you run out.  After discharge, you should have regular check-up appointments with your healthcare provider that is prescribing your Eliquis.    What do you do if you miss a dose? If a dose of ELIQUIS is not taken at the scheduled time, take it as soon as possible on the same day and twice-daily administration should be resumed. The dose should not be doubled to make up for a missed dose.  Important Safety Information A possible side effect of Eliquis is bleeding. You should call your healthcare provider right away if you experience any of the following: ? Bleeding from an injury or your nose that does not stop. ? Unusual colored urine (red or dark brown) or unusual colored stools (red or black). ? Unusual bruising for unknown reasons. ? A serious fall or if you hit your head (even if there is no bleeding).  Some medicines may interact with Eliquis and might increase your risk of bleeding or clotting while on Eliquis. To help avoid this, consult your healthcare provider or pharmacist prior to using any new prescription or non-prescription medications,  including herbals, vitamins, non-steroidal anti-inflammatory drugs (NSAIDs) and supplements.  This website has more information on Eliquis (apixaban): http://www.eliquis.com/eliquis/home

## 2019-04-02 NOTE — Progress Notes (Signed)
PROGRESS NOTE  Hayley [name] GYF:[id number] DOB: 1945-12-12 DOA: 03/31/2019 PCP: [name], MD  HPI/Recap of past 24 hours:  Hayley [name] is a 73 y.o. female with medical history significant for recurrent VTE now taking low-dose Eliquis inconsistently, suspected asthma or COPD, depression and anxiety, type 2 diabetes mellitus, hypothyroidism, history of CVA, and chronic migraines, now presenting to the emergency department for evaluation of chest discomfort, shortness of breath, and GI upset.  Patient reports that she has had a poor appetite and intermittent abdominal discomfort and loose stools for the past 1 to 2 weeks, and then woke this morning with intense chest pressure and shortness of breath.  Chest pressure was mainly in the central chest and she is also had some pain that radiates towards the right shoulder.  Chest pressure has been constant.  She denies any lower extremity swelling recently, but has developed some pain in the bilateral calves.  She saw some red blood in the toilet bowl recently.  She denies any fevers or chills, has had occasional cough, and denies any sick contacts.  She reports history of upper and lower extremity DVT, as well as multiple PEs, stating that she was anticoagulated for about a year after a PE in 2013, suffered recurrence in 2016 after surgery, currently taking Eliquis 2.5 mg twice daily, but takes this inconsistently and has not had any doses in a few days.  ED Course: Upon arrival to the ED, patient is found to be afebrile, saturating upper 80s at rest and 70s with ambulation, and with blood pressure 105/58.  EKG features a sinus rhythm and chest x-ray is notable for chronic interstitial changes that appear similar to prior.  CT the abdomen pelvis is negative for acute abnormalities.  Chemistry panel and CBC are unremarkable, troponin is normal x2, BNP normal, and urinalysis unremarkable.  Patient was treated with acetaminophen and Zofran in the emergency  department, placed on supplemental oxygen, and hospitalist consulted for admission.  04/02/19: Patient was seen and examined at her bedside.  She reports dysuria and suprapubic abdominal discomfort.  Urine analysis was negative for pyuria.  Will obtain urine culture and start Pyridium.  Assessment/Plan: Principal Problem:   Acute respiratory failure with hypoxemia (HCC) Active Problems:   Diabetes mellitus type II, non insulin dependent (HCC)   History of pulmonary embolism   OSA on CPAP   Hypothyroidism   Essential hypertension   MDD (major depressive episode), single episode, severe, no psychosis (HCC)   Pain syndrome, chronic   Chest pain   Acute hypoxic respiratory failure unclear etiology CTA PE unremarkable for acute PE Afebrile with no leukocytosis Continue to maintain O2 saturation greater than 92% Continue inhalers Reviewed chest x-ray and CT chest which showed possible atelectasis at bases Not on oxygen supplementation at baseline  Dysuria UA unremarkable Obtained urine culture Afebrile with no leukocytosis Started on Pyridium x2 days for symptom control  History of PE with noncompliance Eliquis restarted at 5 mg twice daily  Type 2 diabetes, controlled Continue insulin sliding scale Obtain hemoglobin A1c  Chronic depression-anxiety Continue Trintellix, trazodone, Klonopin  Hypothyroidism Continue Synthroid  Abdominal discomfort Denies diarrhea this morning States she feels gassy Start simethicone x2 days     Code Status: Full code  Family Communication: We will call family if okay with the patient.  Disposition Plan: Discharge possibly tomorrow once symptoms have improved and urine cultures are negative.   Consultants:  None  Procedures:  None  Antimicrobials:  None  DVT prophylaxis: Eliquis   Objective: Vitals:   04/01/19 2025 04/01/19 2247 04/02/19 0433 04/02/19 0834  BP:  113/71 (!) 102/53   Pulse: 70 66 (!) 55 61  Resp: 16  18 19 18   Temp:  98.7 F (37.1 C) 97.9 F (36.6 C)   TempSrc:  Oral Oral   SpO2: 93% 92% 93% 90%  Weight:   93.4 kg   Height:        Intake/Output Summary (Last 24 hours) at 04/02/2019 0848 Last data filed at 04/02/2019 0530 Gross per 24 hour  Intake 540.59 ml  Output 1000 ml  Net -459.41 ml   Filed Weights   03/31/19 1428 04/01/19 0106 04/02/19 0433  Weight: 90.7 kg 92 kg 93.4 kg    Exam:   General: 73 y.o. year-old female well developed well nourished in no acute distress.  Alert and oriented x3.  Cardiovascular: Regular rate and rhythm with no rubs or gallops.  No thyromegaly or JVD noted.    Respiratory: Clear to auscultation with no wheezes or rales. Good inspiratory effort.  Abdomen: Mild suprapubic tenderness on palpation.  Soft nontender nondistended with normal bowel sounds x4 quadrants.  Musculoskeletal: No lower extremity edema. 2/4 pulses in all 4 extremities.  Psychiatry: Mood is appropriate for condition and setting   Data Reviewed: CBC: Recent Labs  Lab 03/31/19 1503 04/01/19 0407 04/02/19 0500  WBC 5.0 4.7 4.7  NEUTROABS 3.0 2.4  --   HGB 13.6 12.9 12.6  HCT 42.5 41.6 41.0  MCV 87.1 89.5 89.1  PLT 248 228 557   Basic Metabolic Panel: Recent Labs  Lab 03/31/19 1611 04/01/19 0407  NA 141 139  K 4.2 3.6  CL 104 102  CO2 27 28  GLUCOSE 92 180*  BUN 6* 7*  CREATININE 0.75 0.84  CALCIUM 8.9 8.7*   GFR: Estimated Creatinine Clearance: 67.6 mL/min (by C-G formula based on SCr of 0.84 mg/dL). Liver Function Tests: Recent Labs  Lab 03/31/19 1611  AST 15  ALT 8  ALKPHOS 54  BILITOT 0.8  PROT 6.9  ALBUMIN 3.7   No results for input(s): LIPASE, AMYLASE in the last 168 hours. No results for input(s): AMMONIA in the last 168 hours. Coagulation Profile: No results for input(s): INR, PROTIME in the last 168 hours. Cardiac Enzymes: No results for input(s): CKTOTAL, CKMB, CKMBINDEX, TROPONINI in the last 168 hours. BNP (last 3  results) No results for input(s): PROBNP in the last 8760 hours. HbA1C: No results for input(s): HGBA1C in the last 72 hours. CBG: Recent Labs  Lab 04/01/19 0600 04/01/19 1315 04/01/19 1704 04/01/19 2152 04/02/19 0604  GLUCAP 140* 125* 133* 133* 105*   Lipid Profile: No results for input(s): CHOL, HDL, LDLCALC, TRIG, CHOLHDL, LDLDIRECT in the last 72 hours. Thyroid Function Tests: No results for input(s): TSH, T4TOTAL, FREET4, T3FREE, THYROIDAB in the last 72 hours. Anemia Panel: No results for input(s): VITAMINB12, FOLATE, FERRITIN, TIBC, IRON, RETICCTPCT in the last 72 hours. Urine analysis:    Component Value Date/Time   COLORURINE YELLOW 03/31/2019 [place] 03/31/2019 1551   APPEARANCEUR CLOUDY 12/19/2014 0730   LABSPEC 1.008 03/31/2019 1551   LABSPEC 1.025 12/19/2014 0730   PHURINE 7.0 03/31/2019 [place] 03/31/2019 1551   GLUCOSEU see comment 12/19/2014 0730   HGBUR NEGATIVE 03/31/2019 [place] 03/31/2019 1551   BILIRUBINUR n 05/08/2016 1506   BILIRUBINUR see comment 12/19/2014 0730   KETONESUR NEGATIVE 03/31/2019 1551  PROTEINUR NEGATIVE 03/31/2019 1551   UROBILINOGEN negative 05/08/2016 1506   UROBILINOGEN 1.0 11/30/2014 1215   NITRITE NEGATIVE 03/31/2019 1551   LEUKOCYTESUR NEGATIVE 03/31/2019 1551   LEUKOCYTESUR see comment 12/19/2014 0730   Sepsis Labs: @LABRCNTIP (procalcitonin:4,lacticidven:4)  ) Recent Results (from the past 240 hour(s))  SARS Coronavirus 2 (CEPHEID - Performed in [place] hospital lab), Hosp Order     Status: None   Collection Time: 03/31/19 10:30 PM   Specimen: Nasopharyngeal Swab  Result Value Ref Range Status   SARS Coronavirus 2 NEGATIVE NEGATIVE Final    Comment: (NOTE) If result is NEGATIVE SARS-CoV-2 target nucleic acids are NOT DETECTED. The SARS-CoV-2 RNA is generally detectable in upper and lower  respiratory specimens during the acute phase of infection. The  lowest  concentration of SARS-CoV-2 viral copies this assay can detect is 250  copies / mL. A negative result does not preclude SARS-CoV-2 infection  and should not be used as the sole basis for treatment or other  patient management decisions.  A negative result may occur with  improper specimen collection / handling, submission of specimen other  than nasopharyngeal swab, presence of viral mutation(s) within the  areas targeted by this assay, and inadequate number of viral copies  (<250 copies / mL). A negative result must be combined with clinical  observations, patient history, and epidemiological information. If result is POSITIVE SARS-CoV-2 target nucleic acids are DETECTED. The SARS-CoV-2 RNA is generally detectable in upper and lower  respiratory specimens dur ing the acute phase of infection.  Positive  results are indicative of active infection with SARS-CoV-2.  Clinical  correlation with patient history and other diagnostic information is  necessary to determine patient infection status.  Positive results do  not rule out bacterial infection or co-infection with other viruses. If result is PRESUMPTIVE POSTIVE SARS-CoV-2 nucleic acids MAY BE PRESENT.   A presumptive positive result was obtained on the submitted specimen  and confirmed on repeat testing.  While 2019 novel coronavirus  (SARS-CoV-2) nucleic acids may be present in the submitted sample  additional confirmatory testing may be necessary for epidemiological  and / or clinical management purposes  to differentiate between  SARS-CoV-2 and other Sarbecovirus currently known to infect humans.  If clinically indicated additional testing with an alternate test  methodology [phone]) is advised. The SARS-CoV-2 RNA is generally  detectable in upper and lower respiratory sp ecimens during the acute  phase of infection. The expected result is Negative. Fact Sheet for Patients:   StrictlyIdeas.no Fact Sheet for Healthcare Providers: BankingDealers.co.za This test is not yet approved or cleared by the [place] FDA and has been authorized for detection and/or diagnosis of SARS-CoV-2 by FDA under an Emergency Use Authorization (EUA).  This EUA will remain in effect (meaning this test can be used) for the duration of the COVID-19 declaration under Section 564(b)(1) of the Act, 21 U.S.C. section 360bbb-3(b)(1), unless the authorization is terminated or revoked sooner. Performed at [facility], [place] [address], [place], [place] [address]       Studies: Ct Angio Chest Pe W Or Wo Contrast  Result Date: 04/01/2019 CLINICAL DATA:  PE suspected. History of pulmonary embolism. Acute onset chest discomfort, shortness of breath and near syncope. Recently missed doses of anticoagulation. EXAM: CT ANGIOGRAPHY CHEST WITH CONTRAST TECHNIQUE: Multidetector CT imaging of the chest was performed using the standard protocol during bolus administration of intravenous contrast. Multiplanar CT image reconstructions and MIPs were obtained to evaluate the vascular  anatomy. CONTRAST:  130mL OMNIPAQUE IOHEXOL 350 MG/ML SOLN COMPARISON:  CTA chest 11/17/2017 FINDINGS: Cardiovascular: Satisfactory opacification of the pulmonary arteries to the segmental level. Respiratory motion may limit detection of smaller subsegmental pulmonary emboli. No large central or segmental pulmonary embolus. Normal heart size. No pericardial effusion. Atherosclerotic plaque within the normal caliber aorta. Atherosclerotic calcification of the coronary arteries. Mild tortuosity of the right brachiocephalic vessels. Mediastinum/Nodes: No enlarged mediastinal or axillary lymph nodes. Thyroid gland, trachea, and esophagus demonstrate no significant findings. Lungs/Pleura: No consolidation, features of edema, pneumothorax, or effusion. Bandlike atelectatic changes are  present at both bases. Geographic regions of mosaic attenuation are likely related to imaging during exhalation. No suspicious pulmonary nodules or masses. Upper Abdomen: No acute abnormalities present in the visualized portions of the upper abdomen. Scattered colonic diverticula without focal pericolonic inflammation to suggest diverticulitis. Musculoskeletal: Multilevel degenerative changes are present in the imaged portions of the spine. Changes are similar to comparison CT dated 11/17/2017. Bilateral breast prostheses are noted with stable capsular calcification of the right implant. Thoracic nerve stimulator is visualized on scout images with battery pack overlying the right upper quadrant. IMPRESSION: 1. No evidence of large central or segmental pulmonary embolus. Respiratory motion may limit detection of smaller subsegmental pulmonary emboli. 2. No acute intrathoracic process. 3. Coronary atherosclerosis. 4.  Aortic Atherosclerosis (ICD10-I70.0). Electronically Signed   By: Lovena Le M.D.   On: 04/01/2019 21:41    Scheduled Meds:  albuterol  3 mL Inhalation BID   apixaban  10 mg Oral BID   Followed by   Derrill Memo ON 04/09/2019] apixaban  5 mg Oral BID   benzonatate  100 mg Oral BID   diclofenac  1 patch Transdermal Daily   insulin aspart  0-5 Units Subcutaneous QHS   insulin aspart  0-9 Units Subcutaneous TID WC   levothyroxine  25 mcg Oral Q0600   loratadine  10 mg Oral Daily   mirabegron ER  50 mg Oral QHS   mometasone-formoterol  2 puff Inhalation BID   propranolol  60 mg Oral BID   simethicone  80 mg Oral QID   sodium chloride flush  3 mL Intravenous Q12H   sodium chloride flush  3 mL Intravenous Q12H   vortioxetine HBr  20 mg Oral Daily    Continuous Infusions:  sodium chloride       LOS: 1 day     Kayleen Memos, MD [facility] Pager [phone]  If 7PM-7AM, please contact night-coverage [email or web address] Password [facility] 04/02/2019, 8:48 AM

## 2019-04-03 LAB — GLUCOSE, CAPILLARY
Glucose-Capillary: 106 mg/dL — ABNORMAL HIGH (ref 70–99)
Glucose-Capillary: 87 mg/dL (ref 70–99)
Glucose-Capillary: 122 mg/dL — ABNORMAL HIGH (ref 70–99)
Glucose-Capillary: 113 mg/dL — ABNORMAL HIGH (ref 70–99)

## 2019-04-03 MED ORDER — ALBUTEROL SULFATE (2.5 MG/3ML) 0.083% IN NEBU: 3.0000 mL | INHALATION_SOLUTION | Freq: Four times a day (QID) | RESPIRATORY_TRACT | Status: DC | PRN

## 2019-04-03 MED ORDER — MENTHOL 3 MG MT LOZG
1.0000 | LOZENGE | OROMUCOSAL | Status: DC | PRN
  Administered 2019-04-03: 3 mg via ORAL
  Filled 2019-04-03: qty 9

## 2019-04-03 NOTE — Progress Notes (Signed)
0613 Patient ambulated to bathroom to void upon standing to wipe patient became lightheaded,dizzy,nauseous, and pale.Patient assisted back to bed by nursing staff.Blood pressure 99/55 HR 58 and oxygen saturation down 84% on room air .Oxygen placed at 2 liters and saturations gradually increased to 93%. After couple minutes patient stated staring to feel better.Bed alarm on for safety and educated patient not to get out of bed without assistance from nursing staff. Text paged NP X. Blount.No new orders received will continue to monitor patient.

## 2019-04-03 NOTE — Care Management Important Message (Signed)
Important Message  Patient Details  Name: Hayley [name] MRN: [id number] Date of Birth: 03-29-1946   Medicare Important Message Given:  Yes     [name] 04/03/2019, 11:56 AM

## 2019-04-03 NOTE — Progress Notes (Signed)
RT offered pt CPAP dream station for the night and pt declined stating she has not worn her home unit for a long time now and does not like our mask or machine. RT expressed that if she changes her mind to call for a machine and placement. Pt respiratory status stable at this time on [place] 2Lpm.  RT will continue to monitor.

## 2019-04-03 NOTE — Progress Notes (Addendum)
PROGRESS NOTE  Hayley [name] ZOX:[id number] DOB: 01-Feb-1946 DOA: 03/31/2019 PCP: [name], MD  HPI/Recap of past 24 hours:  Hayley [name] is a 73 y.o. female with medical history significant for recurrent VTE now taking low-dose Eliquis inconsistently, suspected asthma or COPD, depression and anxiety, type 2 diabetes mellitus, hypothyroidism, history of CVA, and chronic migraines, now presenting to the emergency department for evaluation of chest discomfort, shortness of breath, and GI upset.  Patient reports that she has had a poor appetite and intermittent abdominal discomfort and loose stools for the past 1 to 2 weeks, and then woke this morning with intense chest pressure and shortness of breath.  Chest pressure was mainly in the central chest and she is also had some pain that radiates towards the right shoulder.  Chest pressure has been constant.  She denies any lower extremity swelling recently, but has developed some pain in the bilateral calves.  She saw some red blood in the toilet bowl recently.  She denies any fevers or chills, has had occasional cough, and denies any sick contacts.  She reports history of upper and lower extremity DVT, as well as multiple PEs, stating that she was anticoagulated for about a year after a PE in 2013, suffered recurrence in 2016 after surgery, currently taking Eliquis 2.5 mg twice daily, but takes this inconsistently and has not had any doses in a few days.  ED Course: Upon arrival to the ED, patient is found to be afebrile, saturating upper 80s at rest and 70s with ambulation, and with blood pressure 105/58.  EKG features a sinus rhythm and chest x-ray is notable for chronic interstitial changes that appear similar to prior.  CT the abdomen pelvis is negative for acute abnormalities.  Chemistry panel and CBC are unremarkable, troponin is normal x2, BNP normal, and urinalysis unremarkable.  Patient was treated with acetaminophen and Zofran in the emergency  department, placed on supplemental oxygen, and hospitalist consulted for admission.  04/03/19: Patient was seen and examined at her bedside this morning.  She reports dysuria and suprapubic discomfort has improved on Pyridium.  Reports lightheadedness this morning when she ambulated to the bathroom.  Fall precautions in place.  Orthostatic vital signs ordered, PT OT to assess.   Assessment/Plan: Principal Problem:   Acute respiratory failure with hypoxemia (HCC) Active Problems:   Diabetes mellitus type II, non insulin dependent (HCC)   History of pulmonary embolism   OSA on CPAP   Hypothyroidism   Essential hypertension   MDD (major depressive episode), single episode, severe, no psychosis (HCC)   Pain syndrome, chronic   Chest pain  Chest pain, ACS ruled out Troponins S negative x2 Twelve-lead EKG reviewed unremarkable for acute ischemia No changes on telemetry Chest pain has resolved CTA PE negative  Acute hypoxic respiratory failure unclear etiology CTA PE unremarkable for acute PE Afebrile with no leukocytosis Continue to maintain O2 saturation greater than 92% Continue inhalers Reviewed chest x-ray and CT chest which showed possible atelectasis at bases Not on oxygen supplementation at baseline Continue bronchodilators Home O2 evaluation prior to discharge.  Lightheadness/ positional dizziness Obtain orthosthatic VS U/A negative No palpitation, not tachycardia PT OT to assess Fall precautions  Dysuria, improving UA unremarkable Obtained urine culture in process Afebrile with no leukocytosis C/w Pyridium x2 days for symptom control  History of PE with noncompliance C/w eliquis 5 mg twice daily  Type 2 diabetes, controlled Continue insulin sliding scale Hemoglobin A1c 6.4 on 04/02/19 Avoid hypoglycemia  Chronic depression-anxiety Continue Trintellix, trazodone, Klonopin  Hypothyroidism Continue Synthroid  Abdominal discomfort Denies diarrhea this  morning States she feels gassy C/w simethicone x2 days prn     Code Status: Full code  Family Communication: We will call family if okay with the patient.  Disposition Plan: Discharge possibly tomorrow 04/04/19 once symptomatology has improved.  Consultants:  None  Procedures:  None  Antimicrobials:  None  DVT prophylaxis: Eliquis   Objective: Vitals:   04/03/19 0046 04/03/19 0601 04/03/19 0613 04/03/19 0825  BP: 90/61 (!) 97/57 (!) 99/55   Pulse: (!) 59 (!) 54 (!) 58   Resp: 18 17 18    Temp: 97.9 F (36.6 C) 98.3 F (36.8 C)    TempSrc: Oral Oral    SpO2: 92% 92% 93% 95%  Weight:   93.5 kg   Height:        Intake/Output Summary (Last 24 hours) at 04/03/2019 0935 Last data filed at 04/03/2019 0610 Gross per 24 hour  Intake 480 ml  Output 1501 ml  Net -1021 ml   Filed Weights   04/01/19 0106 04/02/19 0433 04/03/19 0613  Weight: 92 kg 93.4 kg 93.5 kg    Exam:   General: 73 y.o. year-old female well developed well nourished in no acute distress.  Alert and oriented times 3.  Cardiovascular: The rate and rhythm no rubs or gallops.  No JVD or thyromegaly.   Respiratory: Clear to auscultation.  No wheezes or rales.  Good respiratory effort..  Abdomen: Nontender nondistended normal bowel sounds present.   Musculoskeletal: No lower extremity edema.  2 out of 4 pulses in all 4 extremities.  Psychiatry: Mood is appropriate for condition and setting.  Data Reviewed: CBC: Recent Labs  Lab 03/31/19 1503 04/01/19 0407 04/02/19 0500  WBC 5.0 4.7 4.7  NEUTROABS 3.0 2.4  --   HGB 13.6 12.9 12.6  HCT 42.5 41.6 41.0  MCV 87.1 89.5 89.1  PLT 248 228 637   Basic Metabolic Panel: Recent Labs  Lab 03/31/19 1611 04/01/19 0407  NA 141 139  K 4.2 3.6  CL 104 102  CO2 27 28  GLUCOSE 92 180*  BUN 6* 7*  CREATININE 0.75 0.84  CALCIUM 8.9 8.7*   GFR: Estimated Creatinine Clearance: 67.7 mL/min (by C-G formula based on SCr of 0.84 mg/dL). Liver  Function Tests: Recent Labs  Lab 03/31/19 1611  AST 15  ALT 8  ALKPHOS 54  BILITOT 0.8  PROT 6.9  ALBUMIN 3.7   No results for input(s): LIPASE, AMYLASE in the last 168 hours. No results for input(s): AMMONIA in the last 168 hours. Coagulation Profile: No results for input(s): INR, PROTIME in the last 168 hours. Cardiac Enzymes: No results for input(s): CKTOTAL, CKMB, CKMBINDEX, TROPONINI in the last 168 hours. BNP (last 3 results) No results for input(s): PROBNP in the last 8760 hours. HbA1C: Recent Labs    04/02/19 0500  HGBA1C 6.4*   CBG: Recent Labs  Lab 04/02/19 0604 04/02/19 1119 04/02/19 1548 04/02/19 2119 04/03/19 0557  GLUCAP 105* 110* 165* 146* 106*   Lipid Profile: No results for input(s): CHOL, HDL, LDLCALC, TRIG, CHOLHDL, LDLDIRECT in the last 72 hours. Thyroid Function Tests: No results for input(s): TSH, T4TOTAL, FREET4, T3FREE, THYROIDAB in the last 72 hours. Anemia Panel: No results for input(s): VITAMINB12, FOLATE, FERRITIN, TIBC, IRON, RETICCTPCT in the last 72 hours. Urine analysis:    Component Value Date/Time   COLORURINE YELLOW 03/31/2019 [place] 03/31/2019 1551  APPEARANCEUR CLOUDY 12/19/2014 0730   LABSPEC 1.008 03/31/2019 1551   LABSPEC 1.025 12/19/2014 0730   PHURINE 7.0 03/31/2019 1551   GLUCOSEU NEGATIVE 03/31/2019 1551   GLUCOSEU see comment 12/19/2014 0730   HGBUR NEGATIVE 03/31/2019 [place] 03/31/2019 1551   BILIRUBINUR n 05/08/2016 1506   BILIRUBINUR see comment 12/19/2014 0730   KETONESUR NEGATIVE 03/31/2019 1551   PROTEINUR NEGATIVE 03/31/2019 1551   UROBILINOGEN negative 05/08/2016 1506   UROBILINOGEN 1.0 11/30/2014 1215   NITRITE NEGATIVE 03/31/2019 1551   LEUKOCYTESUR NEGATIVE 03/31/2019 1551   LEUKOCYTESUR see comment 12/19/2014 0730   Sepsis Labs: @LABRCNTIP (procalcitonin:4,lacticidven:4)  ) Recent Results (from the past 240 hour(s))  SARS Coronavirus 2 (CEPHEID -  Performed in [place] hospital lab), Hosp Order     Status: None   Collection Time: 03/31/19 10:30 PM   Specimen: Nasopharyngeal Swab  Result Value Ref Range Status   SARS Coronavirus 2 NEGATIVE NEGATIVE Final    Comment: (NOTE) If result is NEGATIVE SARS-CoV-2 target nucleic acids are NOT DETECTED. The SARS-CoV-2 RNA is generally detectable in upper and lower  respiratory specimens during the acute phase of infection. The lowest  concentration of SARS-CoV-2 viral copies this assay can detect is 250  copies / mL. A negative result does not preclude SARS-CoV-2 infection  and should not be used as the sole basis for treatment or other  patient management decisions.  A negative result may occur with  improper specimen collection / handling, submission of specimen other  than nasopharyngeal swab, presence of viral mutation(s) within the  areas targeted by this assay, and inadequate number of viral copies  (<250 copies / mL). A negative result must be combined with clinical  observations, patient history, and epidemiological information. If result is POSITIVE SARS-CoV-2 target nucleic acids are DETECTED. The SARS-CoV-2 RNA is generally detectable in upper and lower  respiratory specimens dur ing the acute phase of infection.  Positive  results are indicative of active infection with SARS-CoV-2.  Clinical  correlation with patient history and other diagnostic information is  necessary to determine patient infection status.  Positive results do  not rule out bacterial infection or co-infection with other viruses. If result is PRESUMPTIVE POSTIVE SARS-CoV-2 nucleic acids MAY BE PRESENT.   A presumptive positive result was obtained on the submitted specimen  and confirmed on repeat testing.  While 2019 novel coronavirus  (SARS-CoV-2) nucleic acids may be present in the submitted sample  additional confirmatory testing may be necessary for epidemiological  and / or clinical management  purposes  to differentiate between  SARS-CoV-2 and other Sarbecovirus currently known to infect humans.  If clinically indicated additional testing with an alternate test  methodology [phone]) is advised. The SARS-CoV-2 RNA is generally  detectable in upper and lower respiratory sp ecimens during the acute  phase of infection. The expected result is Negative. Fact Sheet for Patients:  StrictlyIdeas.no Fact Sheet for Healthcare Providers: BankingDealers.co.za This test is not yet approved or cleared by the [place] FDA and has been authorized for detection and/or diagnosis of SARS-CoV-2 by FDA under an Emergency Use Authorization (EUA).  This EUA will remain in effect (meaning this test can be used) for the duration of the COVID-19 declaration under Section 564(b)(1) of the Act, 21 U.S.C. section 360bbb-3(b)(1), unless the authorization is terminated or revoked sooner. Performed at [facility], [place] [address] [place], [place] [address]       Studies: No results found.  Scheduled  Meds:  albuterol  3 mL Inhalation BID   apixaban  5 mg Oral BID   benzonatate  100 mg Oral BID   diclofenac  1 patch Transdermal Daily   insulin aspart  0-5 Units Subcutaneous QHS   insulin aspart  0-9 Units Subcutaneous TID WC   levothyroxine  25 mcg Oral Q0600   loratadine  10 mg Oral Daily   mirabegron ER  50 mg Oral QHS   mometasone-formoterol  2 puff Inhalation BID   phenazopyridine  100 mg Oral TID WC   propranolol  60 mg Oral BID   simethicone  80 mg Oral QID   sodium chloride flush  3 mL Intravenous Q12H   sodium chloride flush  3 mL Intravenous Q12H   vortioxetine HBr  20 mg Oral Daily    Continuous Infusions:  sodium chloride       LOS: 2 days     Kayleen Memos, MD [facility] Pager [phone]  If 7PM-7AM, please contact night-coverage [email or web address] Password [facility] 04/03/2019, 9:35 AM

## 2019-04-03 NOTE — Progress Notes (Signed)
Occupational Therapy Evaluation Patient Details Name: Hayley [name] MRN: [id number] DOB: Nov 01, 1945 Today's Date: 04/03/2019    History of Present Illness 73 y.o. female admitted with chest discomfort, shortness of breath, and GI upset. PMhx: recurrent VTE now taking low-dose Eliquis inconsistently, suspected asthma or COPD, depression and anxiety, DM, hypothyroidism, CVA, and chronic migraines.   Clinical Impression   PTA, pt was living at home alone, reporting loss of her husband in March, pt reports she was independent with ADL/IADL and functional mobiltiy. Pt reports she was still mowing her lawn and driving. Pt currently requires S for ADL and functional mobility. VSS throughout session. Pt reported dizziness with quick head movement. Due to decline in current level of function, pt would benefit from acute OT to address established goals to facilitate safe D/C to venue listed below. At this time, recommend HHOT follow-up to address independence with IADL. Will continue to follow acutely.     Follow Up Recommendations  Home health OT;Supervision - Intermittent    Equipment Recommendations  None recommended by OT    Recommendations for Other Services       Precautions / Restrictions Precautions Precautions: None Restrictions Weight Bearing Restrictions: No      Mobility Bed Mobility Overal bed mobility: Modified Independent                Transfers Overall transfer level: Modified independent                    Balance Overall balance assessment: Mild deficits observed, not formally tested                                         ADL either performed or assessed with clinical judgement   ADL Overall ADL's : Needs assistance/impaired                                       General ADL Comments: overall S for ADL completion;modified independent for functional mobiltiy;S with more dynamic level balance;no signs or symptoms of  dizziness or lightheadedness during session;able to figure-4 to adjust socks;able to adjust blinds;no LOB or instability noted     Vision Baseline Vision/History: No visual deficits Patient Visual Report: No change from baseline Vision Assessment?: Yes Eye Alignment: Within Functional Limits Ocular Range of Motion: Within Functional Limits Alignment/Gaze Preference: Within Defined Limits Tracking/Visual Pursuits: Able to track stimulus in all quads without difficulty Saccades: Within functional limits Convergence: Within functional limits Visual Fields: No apparent deficits Additional Comments: noted minor nystagmus in L eye with end range gaze to the left     Perception     Praxis      Pertinent Vitals/Pain Pain Assessment: No/denies pain     Hand Dominance Right   Extremity/Trunk Assessment Upper Extremity Assessment Upper Extremity Assessment: Generalized weakness   Lower Extremity Assessment Lower Extremity Assessment: Generalized weakness   Cervical / Trunk Assessment Cervical / Trunk Assessment: Normal   Communication Communication Communication: No difficulties   Cognition Arousal/Alertness: Awake/alert Behavior During Therapy: WFL for tasks assessed/performed Overall Cognitive Status: Within Functional Limits for tasks assessed  General Comments  VSS throughout session;pt emotional when discussing her husband;discussed support groups available to pt and having friends;family assist with IADL    Exercises     Shoulder Instructions      Home Living Family/patient expects to be discharged to:: Private residence Living Arrangements: Alone   Type of Home: House       Home Layout: Multi-level;Able to live on main level with bedroom/bathroom Alternate Level Stairs-Number of Steps: flight   Bathroom Shower/Tub: Tub/shower unit   Bathroom Toilet: Handicapped height     Home Equipment: Environmental consultant - 2  wheels;Tub bench   Additional Comments: basement laundry with cat there. Spouse died in 12-15-2018      Prior Functioning/Environment Level of Independence: Independent                 OT Problem List: Decreased activity tolerance;Impaired balance (sitting and/or standing);Decreased safety awareness;Decreased knowledge of use of DME or AE      OT Treatment/Interventions: Self-care/ADL training;Therapeutic exercise;DME and/or AE instruction;Energy conservation;Patient/family education;Balance training    OT Goals(Current goals can be found in the care plan section) Acute Rehab OT Goals Patient Stated Goal: to return home a OT Goal Formulation: With patient Time For Goal Achievement: 04/17/19 Potential to Achieve Goals: Good ADL Goals Pt Will Perform Grooming: with modified independence Pt Will Perform Upper Body Dressing: with modified independence Pt Will Perform Lower Body Dressing: with modified independence Pt Will Transfer to Toilet: with modified independence Pt Will Perform Toileting - Clothing Manipulation and hygiene: with modified independence Pt Will Perform Tub/Shower Transfer: with modified independence Additional ADL Goal #1: Pt will independently demonstrate use of 3 energy conservation strategies during ADL.  OT Frequency: Min 2X/week   Barriers to D/C: Inaccessible home environment;Decreased caregiver support  pt has 3 level       Co-evaluation              AM-PAC OT "6 Clicks" Daily Activity     Outcome Measure Help from another person eating meals?: None Help from another person taking care of personal grooming?: A Little Help from another person toileting, which includes using toliet, bedpan, or urinal?: A Little Help from another person bathing (including washing, rinsing, drying)?: A Little Help from another person to put on and taking off regular upper body clothing?: A Little Help from another person to put on and taking off regular lower  body clothing?: A Little 6 Click Score: 19   End of Session Nurse Communication: Mobility status  Activity Tolerance: Patient tolerated treatment well Patient left: in chair;with call bell/phone within reach  OT Visit Diagnosis: Unsteadiness on feet (R26.81);Other abnormalities of gait and mobility (R26.89);Muscle weakness (generalized) (M62.81)                Time: 1437-1510 OT Time Calculation (min): 33 min Charges:  OT General Charges $OT Visit: 1 Visit OT Evaluation $OT Eval Moderate Complexity: 1 Mod OT Treatments $Self Care/Home Management : 8-22 mins  Dorinda Hill OTR/L Acute Rehabilitation Services Office: [place] 04/03/2019, 3:59 PM

## 2019-04-03 NOTE — Evaluation (Signed)
Physical Therapy Evaluation Patient Details Name: Hayley [name] MRN: [id number] DOB: 1946-07-08 Today's Date: 04/03/2019   History of Present Illness  73 y.o. female admitted with chest discomfort, shortness of breath, and GI upset. PMhx: recurrent VTE now taking low-dose Eliquis inconsistently, suspected asthma or COPD, depression and anxiety, DM, hypothyroidism, CVA, and chronic migraines.  Clinical Impression  Pt very pleasant reporting loss of spouse in 22-Dec-2022 and feeling more unsure with being alone. Pt reports vague symptoms of unsteadiness and buzzing in her left ear at times. Pt with report of feeling slightly unsteady with transfers and gait this session although no drop in BP or notable balance deficits. Pt requests further vestibular and balance assessment next session and pt educated for potential options of hypofunction however no noted signs at this time. Pt reports wanting to have faster access to assist at home if something were to happen as family is not nearby but declined the option of life alert or ALF. Provided pt with home management education for cell phone, sitting if dizzy and no stairs if feeling unstable. Pt with grossly baseline functional status on this eval but per pt request will continue to follow to further assess balance and vestibular function. Pt with slightly decreased gait speed and activity tolerance who will benefit from acute therapy to maximize independence.  SpO2 90-93% on RA with gait HR 55-58  Orthostatic BPs  Supine 106/66  Sitting 105/72     Standing 113/69  Standing after 3 min 115/65       Follow Up Recommendations No PT follow up    Equipment Recommendations  None recommended by PT    Recommendations for Other Services       Precautions / Restrictions Precautions Precautions: None Restrictions Weight Bearing Restrictions: No      Mobility  Bed Mobility Overal bed mobility: Modified Independent                 Transfers Overall transfer level: Modified independent                  Ambulation/Gait Ambulation/Gait assistance: Modified independent (Device/Increase time) Gait Distance (Feet): 400 Feet Assistive device: None Gait Pattern/deviations: WFL(Within Functional Limits)   Gait velocity interpretation: >2.62 ft/sec, indicative of community ambulatory General Gait Details: decreased speed with steady gait and pt cautious with SpO2 90-93% on RA  Stairs            Wheelchair Mobility    Modified Rankin (Stroke Patients Only)       Balance Overall balance assessment: Mild deficits observed, not formally tested                                           Pertinent Vitals/Pain Pain Assessment: No/denies pain    Home Living Family/patient expects to be discharged to:: Private residence Living Arrangements: Alone   Type of Home: House       Home Layout: Multi-level;Able to live on main level with bedroom/bathroom Home Equipment: Gilford Rile - 2 wheels;Tub bench Additional Comments: basement laundry with cat there. Spouse died in December 22, 2018    Prior Function Level of Independence: Independent               Hand Dominance        Extremity/Trunk Assessment   Upper Extremity Assessment Upper Extremity Assessment: Generalized weakness    Lower Extremity Assessment Lower Extremity  Assessment: Generalized weakness    Cervical / Trunk Assessment Cervical / Trunk Assessment: Normal  Communication   Communication: No difficulties  Cognition Arousal/Alertness: Awake/alert Behavior During Therapy: WFL for tasks assessed/performed Overall Cognitive Status: Within Functional Limits for tasks assessed                                        General Comments      Exercises     Assessment/Plan    PT Assessment Patient needs continued PT services  PT Problem List Decreased activity tolerance;Decreased mobility        PT Treatment Interventions Gait training;Therapeutic exercise;Functional mobility training;Stair training;Balance training;Patient/family education;Therapeutic activities    PT Goals (Current goals can be found in the Care Plan section)  Acute Rehab PT Goals Patient Stated Goal: return home with a safety net to know what is going on with me PT Goal Formulation: With patient Time For Goal Achievement: 04/10/19 Potential to Achieve Goals: Good    Frequency Min 3X/week   Barriers to discharge Decreased caregiver support      Co-evaluation               AM-PAC PT "6 Clicks" Mobility  Outcome Measure Help needed turning from your back to your side while in a flat bed without using bedrails?: None Help needed moving from lying on your back to sitting on the side of a flat bed without using bedrails?: None Help needed moving to and from a bed to a chair (including a wheelchair)?: None Help needed standing up from a chair using your arms (e.g., wheelchair or bedside chair)?: None Help needed to walk in hospital room?: None Help needed climbing 3-5 steps with a railing? : A Little 6 Click Score: 23    End of Session Equipment Utilized During Treatment: Gait belt Activity Tolerance: Patient tolerated treatment well Patient left: in chair;with call bell/phone within reach Nurse Communication: Mobility status PT Visit Diagnosis: Other abnormalities of gait and mobility (R26.89)    Time: 6812-7517 PT Time Calculation (min) (ACUTE ONLY): 41 min   Charges:   PT Evaluation $PT Eval Moderate Complexity: 1 Mod PT Treatments $Therapeutic Activity: 8-22 mins        Ethan, PT Acute Rehabilitation Services Pager: [phone] Office: [place] 04/03/2019, 12:08 PM

## 2019-04-04 DIAGNOSIS — Z9989 Dependence on other enabling machines and devices: Secondary | ICD-10-CM

## 2019-04-04 DIAGNOSIS — J984 Other disorders of lung: Secondary | ICD-10-CM

## 2019-04-04 DIAGNOSIS — R5381 Other malaise: Secondary | ICD-10-CM

## 2019-04-04 DIAGNOSIS — G4733 Obstructive sleep apnea (adult) (pediatric): Secondary | ICD-10-CM

## 2019-04-04 LAB — GLUCOSE, CAPILLARY
Glucose-Capillary: 98 mg/dL (ref 70–99)
Glucose-Capillary: 104 mg/dL — ABNORMAL HIGH (ref 70–99)
Glucose-Capillary: 147 mg/dL — ABNORMAL HIGH (ref 70–99)
Glucose-Capillary: 136 mg/dL — ABNORMAL HIGH (ref 70–99)

## 2019-04-04 LAB — CREATININE, SERUM
Creatinine, Ser: 0.71 mg/dL (ref 0.44–1.00)
GFR calc Af Amer: >60 mL/min (ref >60)
GFR calc non Af Amer: >60 mL/min (ref >60)

## 2019-04-04 MED ORDER — DOXYCYCLINE HYCLATE 100 MG PO TABS
100.0000 mg | ORAL_TABLET | Freq: Two times a day (BID) | ORAL | Status: DC
  Administered 2019-04-04 – 2019-04-05 (×2): 100 mg via ORAL
  Filled 2019-04-04 (×2): qty 1

## 2019-04-04 MED ORDER — HYDROXYZINE HCL 25 MG PO TABS: 25.0000 mg | ORAL_TABLET | Freq: Three times a day (TID) | ORAL | Status: DC | PRN

## 2019-04-04 NOTE — Progress Notes (Signed)
Patient refused use of CPAP for the evening. Will continue to monitor patient.  

## 2019-04-04 NOTE — Progress Notes (Signed)
PROGRESS NOTE    Hayley [name]  YDX:[id number]  DOB: 10-07-1945  DOA: 03/31/2019 PCP: [name], MD  Brief Narrative: 73 year old female with history of recurrent DVT/PEs who has been on chronic anticoagulation with Eliquis, depression/anxiety,  hypothyroidism, history of CVA, chronic migraines, diabetes mellitus,chronic bronchitis?  Asthma, obstructive sleep apnea noncompliant with home CPAP, presented to the ED with complaints of exertional dyspnea and vague complaints of chest/abdominal discomfort.  She also reported poor appetite/intermittent diarrhea over the last couple of weeks.  She apparently did not fill her medications including Eliquis and missed couple of doses.  She states her health has declined since her husband passed away recently. She reports seeing a pulmonologist with [facility] and undergoing PFTs in the past.  Denies history of COPD or history of smoking since her college years.  She states she has felt dyspneic mostly on exertion.  Patient was found to be hypoxic on presentation with O2 sat in high 70s to low 80s on exertion.  Other vital signs were stable.  EKG was unremarkable.  Cyclic troponins negative.  BNP WNL.  Chest x-ray showed chronic interstitial changes similar to prior.  CT abdomen/pelvis was also obtained which was negative for any acute abnormalities.  Patient was placed on O2 3 L nasal cannula and admitted to hospitalist service for further evaluation.  CT chest with contrast was obtained on July 15 which was negative for PE.  No other lung abnormalities reported except for bibasilar atelectasis.    Subjective:  Patient appears anxious but no evidence of tachypnea or respiratory distress.  She is able to talk full sentences without shortness of breath.  She does have 2 L O2 nasal cannula on. She states she has had decline in functional status over the last few months since her husband got sick and recently passed away.  She states her daughter and son-in-law  live in the area but have busy work schedule.  Her son apparently lives in [place].  She states she is unable to perform her activities of daily living without feeling dyspneic and tired.  Objective: Vitals:   04/04/19 0431 04/04/19 0814 04/04/19 0825 04/04/19 1218  BP: 109/69  108/77 114/71  Pulse: (!) 51  (!) 58 (!) 55  Resp: 18   15  Temp: 97.7 F (36.5 C)   98.6 F (37 C)  TempSrc: Oral   Oral  SpO2: 91% 92%  92%  Weight: 94 kg     Height:        Intake/Output Summary (Last 24 hours) at 04/04/2019 1547 Last data filed at 04/04/2019 1121 Gross per 24 hour  Intake 720 ml  Output 2701 ml  Net -1981 ml   Filed Weights   04/02/19 0433 04/03/19 0613 04/04/19 0431  Weight: 93.4 kg 93.5 kg 94 kg    Physical Examination:  General exam: Appears anxious but comfortable without respiratory distress Respiratory system: Clear to auscultation except for decreased breath sounds at bases. Respiratory effort normal. Cardiovascular system: S1 & S2 heard, RRR. No JVD, murmurs, rubs, gallops or clicks. No pedal edema. Gastrointestinal system: Abdomen is obese, nondistended, soft and nontender. No organomegaly or masses felt. Normal bowel sounds heard. Central nervous system: Alert and oriented. No focal neurological deficits. Extremities: Symmetric 5 x 5 power. Skin: No rashes, lesions or ulcers Psychiatry: Judgement and insight appear normal. Mood & affect appropriate.     Data Reviewed: I have personally reviewed following labs and imaging studies  CBC: Recent Labs  Lab 03/31/19  1503 04/01/19 0407 04/02/19 0500  WBC 5.0 4.7 4.7  NEUTROABS 3.0 2.4  --   HGB 13.6 12.9 12.6  HCT 42.5 41.6 41.0  MCV 87.1 89.5 89.1  PLT 248 228 366   Basic Metabolic Panel: Recent Labs  Lab 03/31/19 1611 04/01/19 0407 04/04/19 0359  NA 141 139  --   K 4.2 3.6  --   CL 104 102  --   CO2 27 28  --   GLUCOSE 92 180*  --   BUN 6* 7*  --   CREATININE 0.75 0.84 0.71  CALCIUM 8.9 8.7*  --     GFR: Estimated Creatinine Clearance: 71.3 mL/min (by C-G formula based on SCr of 0.71 mg/dL). Liver Function Tests: Recent Labs  Lab 03/31/19 1611  AST 15  ALT 8  ALKPHOS 54  BILITOT 0.8  PROT 6.9  ALBUMIN 3.7   No results for input(s): LIPASE, AMYLASE in the last 168 hours. No results for input(s): AMMONIA in the last 168 hours. Coagulation Profile: No results for input(s): INR, PROTIME in the last 168 hours. Cardiac Enzymes: No results for input(s): CKTOTAL, CKMB, CKMBINDEX, TROPONINI in the last 168 hours. BNP (last 3 results) No results for input(s): PROBNP in the last 8760 hours. HbA1C: Recent Labs    04/02/19 0500  HGBA1C 6.4*   CBG: Recent Labs  Lab 04/03/19 1057 04/03/19 1603 04/03/19 2059 04/04/19 0609 04/04/19 1119  GLUCAP 87 122* 113* 98 104*   Lipid Profile: No results for input(s): CHOL, HDL, LDLCALC, TRIG, CHOLHDL, LDLDIRECT in the last 72 hours. Thyroid Function Tests: No results for input(s): TSH, T4TOTAL, FREET4, T3FREE, THYROIDAB in the last 72 hours. Anemia Panel: No results for input(s): VITAMINB12, FOLATE, FERRITIN, TIBC, IRON, RETICCTPCT in the last 72 hours. Sepsis Labs: No results for input(s): PROCALCITON, LATICACIDVEN in the last 168 hours.  Recent Results (from the past 240 hour(s))  SARS Coronavirus 2 (CEPHEID - Performed in [facility] hospital lab), Hosp Order     Status: None   Collection Time: 03/31/19 10:30 PM   Specimen: Nasopharyngeal Swab  Result Value Ref Range Status   SARS Coronavirus 2 NEGATIVE NEGATIVE Final    Comment: (NOTE) If result is NEGATIVE SARS-CoV-2 target nucleic acids are NOT DETECTED. The SARS-CoV-2 RNA is generally detectable in upper and lower  respiratory specimens during the acute phase of infection. The lowest  concentration of SARS-CoV-2 viral copies this assay can detect is 250  copies / mL. A negative result does not preclude SARS-CoV-2 infection  and should not be used as the sole basis for  treatment or other  patient management decisions.  A negative result may occur with  improper specimen collection / handling, submission of specimen other  than nasopharyngeal swab, presence of viral mutation(s) within the  areas targeted by this assay, and inadequate number of viral copies  (<250 copies / mL). A negative result must be combined with clinical  observations, patient history, and epidemiological information. If result is POSITIVE SARS-CoV-2 target nucleic acids are DETECTED. The SARS-CoV-2 RNA is generally detectable in upper and lower  respiratory specimens dur ing the acute phase of infection.  Positive  results are indicative of active infection with SARS-CoV-2.  Clinical  correlation with patient history and other diagnostic information is  necessary to determine patient infection status.  Positive results do  not rule out bacterial infection or co-infection with other viruses. If result is PRESUMPTIVE POSTIVE SARS-CoV-2 nucleic acids MAY BE PRESENT.   A presumptive  positive result was obtained on the submitted specimen  and confirmed on repeat testing.  While 2019 novel coronavirus  (SARS-CoV-2) nucleic acids may be present in the submitted sample  additional confirmatory testing may be necessary for epidemiological  and / or clinical management purposes  to differentiate between  SARS-CoV-2 and other Sarbecovirus currently known to infect humans.  If clinically indicated additional testing with an alternate test  methodology [phone]) is advised. The SARS-CoV-2 RNA is generally  detectable in upper and lower respiratory sp ecimens during the acute  phase of infection. The expected result is Negative. Fact Sheet for Patients:  StrictlyIdeas.no Fact Sheet for Healthcare Providers: BankingDealers.co.za This test is not yet approved or cleared by the [place] FDA and has been authorized for detection and/or  diagnosis of SARS-CoV-2 by FDA under an Emergency Use Authorization (EUA).  This EUA will remain in effect (meaning this test can be used) for the duration of the COVID-19 declaration under Section 564(b)(1) of the Act, 21 U.S.C. section 360bbb-3(b)(1), unless the authorization is terminated or revoked sooner. Performed at [facility], [place] [address] [place], [place] [address]       Radiology Studies: No results found.      Scheduled Meds: . apixaban  5 mg Oral BID  . benzonatate  100 mg Oral BID  . diclofenac  1 patch Transdermal Daily  . insulin aspart  0-5 Units Subcutaneous QHS  . insulin aspart  0-9 Units Subcutaneous TID WC  . levothyroxine  25 mcg Oral Q0600  . loratadine  10 mg Oral Daily  . mirabegron ER  50 mg Oral QHS  . mometasone-formoterol  2 puff Inhalation BID  . propranolol  60 mg Oral BID  . simethicone  80 mg Oral QID  . sodium chloride flush  3 mL Intravenous Q12H  . sodium chloride flush  3 mL Intravenous Q12H  . vortioxetine HBr  20 mg Oral Daily   Continuous Infusions: . sodium chloride      Assessment & Plan:    1.  Acute hypoxic respiratory failure: Present on admission.  Patient likely has multifactorial restrictive disease due to chronic basilar scarring of the lung/atelectasis and obstructive sleep apnea/obesity hypoventilation syndrome.  No acute issues noted on CT chest like PE or pneumonias although due to motion artifact, subsegmental PE could not be ruled out.  Patient has now been resumed on Eliquis. Patient underwent high-resolution CT chest in November 2018 showing mild scarring in the lingula and both lower lobes with associated volume loss.  She also had a contrasted CT in March showing atelectasis/basilar scarring.Continuing incentive spirometry.  She has refused to use CPAP at night here.  She states she has her own machine at home but she does not have the supplies like tubing/mask.  She reports seeing a pulmonologist in the  past with [facility] for PFTs as well as Dr. Chase Caller with [facility].  She reports undergoing sleep study through the neurology clinic.  She can be resumed on home inhaler therapy and recommended to follow-up with pulmonary for possibly repeat sleep study/CPAP settings and repeat PFTs.  Patient also reports recurrent sinusitis and is worried about mold in her old house.  She is recommended to follow-up with her primary ENT.  Physical therapy did walk her yesterday and patient did not qualify for O2.  She was satting 92 to 93% upon walking about 400 feet.  Anxiety might be playing a role as well.  Will add hydroxyzine.  She has not had any fever or chills but reports cough.  She is not on any antibiotics currently.  Continue antitussives, add empiric antibiotics for bronchitis.  May also benefit from outpatient stress test.  No evidence of pulmonary hypertension and echocardiogram showed normal EF  2.  Obstructive sleep apnea: Refusing CPAP while here.  Will discuss with family regarding contacting home CPAP company for arranging supplies.  Check for nocturnal O2 desat tonight.   3.  Recurrent DVT/PE: Resumed on anticoagulation.  4.  Type 2 diabetes mellitus: Resume home medications.  5.  Hypothyroidism: Resume home medications  6.?  History of CVA: Not on aspirin or statins.  Continue anticoagulation  7.  Depression/anxiety: Add hydroxyzine as needed.  Likely contributing to problem #1  8.  Chest discomfort/abdominal discomfort/diarrhea: Now resolved.  CT abdomen/pelvis on presentation unremarkable  9.  Deconditioning: Seen by PT/OT and recommended home health to assist with ADLs, going up the stairs, assistance with medication management etc.  Discussed with case management and home health referral placed.  DVT prophylaxis: On Eliquis Code Status: Full code Family / Patient Communication: Discussed with patient and son Corene Cornea Disposition Plan: Home likely in a.m.     LOS: 3 days     Time spent: 35 minutes    Guilford Shi, MD [facility] Pager [phone]  If 7PM-7AM, please contact night-coverage [email or web address] Password [facility] 04/04/2019, 3:47 PM

## 2019-04-05 DIAGNOSIS — R51 Headache: Secondary | ICD-10-CM

## 2019-04-05 DIAGNOSIS — R1013 Epigastric pain: Secondary | ICD-10-CM

## 2019-04-05 DIAGNOSIS — G894 Chronic pain syndrome: Secondary | ICD-10-CM

## 2019-04-05 LAB — GLUCOSE, CAPILLARY
Glucose-Capillary: 117 mg/dL — ABNORMAL HIGH (ref 70–99)
Glucose-Capillary: 128 mg/dL — ABNORMAL HIGH (ref 70–99)
Glucose-Capillary: 160 mg/dL — ABNORMAL HIGH (ref 70–99)

## 2019-04-05 MED ORDER — APIXABAN 5 MG PO TABS: 5.0000 mg | ORAL_TABLET | Freq: Two times a day (BID) | ORAL | 1 refills | Status: DC

## 2019-04-05 MED ORDER — SIMETHICONE 80 MG PO CHEW: 80.0000 mg | CHEWABLE_TABLET | Freq: Four times a day (QID) | ORAL | 0 refills | Status: AC | PRN

## 2019-04-05 MED ORDER — HYDROXYZINE HCL 25 MG PO TABS: 25.0000 mg | ORAL_TABLET | Freq: Three times a day (TID) | ORAL | 0 refills | Status: DC | PRN

## 2019-04-05 MED ORDER — METOCLOPRAMIDE HCL 5 MG PO TABS: 5.0000 mg | ORAL_TABLET | Freq: Three times a day (TID) | ORAL | 0 refills | Status: DC

## 2019-04-05 MED ORDER — SITAGLIPTIN PHOSPHATE 25 MG PO TABS: 25.0000 mg | ORAL_TABLET | Freq: Every day | ORAL | 0 refills | Status: DC

## 2019-04-05 MED ORDER — DICLOFENAC EPOLAMINE 1.3 % TD PTCH: 1.0000 | MEDICATED_PATCH | Freq: Every day | TRANSDERMAL | 1 refills | Status: AC

## 2019-04-05 MED ORDER — BUTALBITAL-APAP-CAFFEINE 50-325-40 MG PO TABS: 1.0000 | ORAL_TABLET | Freq: Four times a day (QID) | ORAL | 0 refills | Status: AC | PRN

## 2019-04-05 MED ORDER — BENZONATATE 100 MG PO CAPS: 100.0000 mg | ORAL_CAPSULE | Freq: Two times a day (BID) | ORAL | 0 refills | Status: DC | PRN

## 2019-04-05 NOTE — Progress Notes (Signed)
Contacted CM for Eliquis, HH and oxygen needs.

## 2019-04-05 NOTE — Progress Notes (Signed)
IV removed Oxygen delivered to the room [facility] set up by CM Paperwork given and all questions answered Patient is waiting on her ride/ friend to arrive.

## 2019-04-05 NOTE — Progress Notes (Signed)
SATURATION QUALIFICATIONS: (This note is used to comply with regulatory documentation for home oxygen)  Patient Saturations on Room Air at Rest = 90%  Patient Saturations on Room Air while Ambulating = 86%  Patient Saturations on 2 Liters of oxygen while Ambulating = 91%  Please briefly explain why patient needs home oxygen:

## 2019-04-05 NOTE — TOC Transition Note (Signed)
Transition of Care [facility]) - CM/SW Discharge Note   Patient Details  Name: Hayley [name] MRN: [id number] Date of Birth: 1946-04-14  Transition of Care [facility]) CM/SW Contact:  Carles Collet, RN Phone Number: 04/05/2019, 4:09 PM   Clinical Narrative:    Oxygen will be delivered to room prior to DC Patient has transportation home from aid through [employer]. Referral accepted by [facility] for home first. Notified Barb Merino that patient also has Medicaid and would benefit from [facility].  Referral also placed to [facility] for community management, patient lives at home alone and would benefit from social worker to ensure food security and assist with transition to home with maximal level of function and independence.     Final next level of care: [place] Barriers to Discharge: No Barriers Identified   Patient Goals and CMS Choice Patient states their goals for this hospitalization and ongoing recovery are:: to return home CMS Medicare.gov Compare Post Acute Care list provided to:: Patient Choice offered to / list presented to : Patient  Discharge Placement                       Discharge Plan and Services                DME Arranged: Oxygen DME Agency: [employer] Date DME Agency Contacted: 04/05/19 Time DME Agency Contacted: [phone] Representative spoke with at DME Agency: Bertrum Sol HH Arranged: RN, PT, OT, Nurse's Aide [place] Agency: [place] Date [place]: 04/05/19 Time [place]: 1608 Representative spoke with at [place]: cory  Social Determinants of Health ([place]) Interventions     Readmission Risk Interventions No flowsheet data found.

## 2019-04-05 NOTE — Progress Notes (Signed)
Patient ride is here, patient taken down and has home oxygen with her.

## 2019-04-05 NOTE — Care Management (Signed)
Nocturnal desat test done 04-05-2019 shows total desats 40 Total desat time 20m 43s Average duration of desat 86s Shortest duration 10s Longest duration 1665s Deepest desat value 82%  Report filed under Media

## 2019-04-05 NOTE — Discharge Summary (Addendum)
Physician Discharge Summary  Hayley [name] FTD:[id number] DOB: May 11, 1946 DOA: 03/31/2019  PCP: [name], MD  Admit date: 03/31/2019 Discharge date: 04/05/2019 Consultations: None Admitted From: home Disposition: home  Discharge Diagnoses:  Principal Problem:   Acute respiratory failure with hypoxemia ([place]) Active Problems:   Diabetes mellitus type II, non insulin dependent ([place])   History of pulmonary embolism   OSA on CPAP   Hypothyroidism   Essential hypertension   MDD (major depressive episode), single episode, severe, no psychosis ([place])   Pain syndrome, chronic   Chest pain   Hospital Course Summary: 73 year old female with history of recurrent DVT/PEs who has been on chronic anticoagulation with Eliquis, depression/anxiety,  hypothyroidism, history of CVA, chronic migraines, diabetes mellitus,chronic bronchitis?  Asthma, obstructive sleep apnea noncompliant with home CPAP, presented to the ED with complaints of exertional dyspnea and vague complaints of chest/abdominal discomfort.  She also reported poor appetite/intermittent diarrhea over the last couple of weeks.  She apparently did not fill her medications including Eliquis and missed couple of doses.  She reports her health has declined since her husband passed away recently.She states her daughter and son-in-law live in the area but have busy work schedule.  Her son apparently lives in [place].  She states she is unable to perform her activities of daily living without feeling dyspneic and tired.She reports seeing pulmonologist, Dr Lucia Gaskins, with [facility] and undergoing PFTs (Report as below from 2016).  Denies history of COPD or history of smoking since her college years.  Patient was found to be hypoxic on presentation with O2 sat in high 70s to low 80s on exertion.  Other vital signs were stable.  EKG was unremarkable.  Cyclic troponins negative.  BNP WNL.  Chest x-ray showed chronic interstitial changes similar to prior.  CT  abdomen/pelvis was also obtained which was negative for any acute abnormalities.  Patient was placed on O2 3 L nasal cannula and admitted to hospitalist service for further evaluation.  CT chest with contrast was obtained on July 15 which was negative for PE.  No other lung abnormalities reported except for bibasilar atelectasis.    1.  Acute hypoxic respiratory failure: Present on admission.  Patient likely has multifactorial restrictive disease due to chronic basilar scarring of the lung/atelectasis and obstructive sleep apnea/obesity hypoventilation syndrome.  No acute issues noted on CT chest like PE or pneumonias although due to motion artifact, subsegmental PE could not be ruled out.  Patient resumed on Eliquis. Patient underwent high-resolution CT chest in November 2018 showing mild scarring in the lingula and both lower lobes with associated volume loss.  She also had a contrasted CT in March showing atelectasis/basilar scarring. She was encouraged incentive spirometry.   She was evaluated at [facility] for possible restrictive lung disease and results as below, she apparently also had PFTs through Dr. Chase Caller with [place] pulmonology.  She reports undergoing sleep study through a neurology clinic. She has refused to use CPAP at night here and apparently not been using at home as well. She can be resumed on home inhaler therapy and recommended to follow-up with pulmonary for possibly repeat sleep study/CPAP settings and repeat PFTs.  Patient also reports recurrent sinusitis and is worried about mold in her old house.  She is recommended to follow-up with her primary ENT.  She has not had any fever or chills here.  She was prescribed antitussives and added empiric antibiotics for bronchitis but patient c/o nausea, so will d/c Doxycycline. Currently cough  resolved. She underwent walking desat studies as well as noctural continuous pulsoximetry and was noted to be dropping O2 sats to as low as 82%. Home 02  has been set up for her. No evidence of pulmonary hypertension and echocardiogram showed normal EF  2.  Obstructive sleep apnea: Refusing CPAP while here. Discussed with family regarding contacting home CPAP company for arranging right supplies and to ensure follow up with primary pulmonologist. Severe hypoxia noted on nocturnal O2 desat study.  3.  Recurrent DVT/PE: Resumed on anticoagulation (per pharmacy should be on Eliquis 5 mg BID)  4.  Type 2 diabetes mellitus: Patient on glipizide at home but reports hypoglycemic episodes with BG dropping to 50s. Her BG has been <200 here. Last Aic 6.4. Will prescribe Januvia per her request for home use and to f/u PCP with twice daily BG checks.  5.  Hypothyroidism: Resume home medications  6.?  History of CVA: Patient states she was told to have "slight stroke" a decade back but she is not sure why she is not on aspirin or statins.  Recommended to f/u PCP regarding this  7.  Depression/anxiety: Added hydroxyzine as needed.  Likely contributing to problem #1 and reduced exercise tolerance. On Trazodone prn for sleep  8.  Chest discomfort/abdominal discomfort/diarrhea/Nausea: Diarrhea Now resolved.  CT abdomen/pelvis on presentation unremarkable. Patient also reports chronic nausea and reports seeing [facility] Dr Collene Mares in the past and uses Bentyl/zofran at baseline. Advised reglan before meals for few days and to f/u primary GI for possible gastric emptying study/EGD if symptoms persist.   9. Migraines : Resume propranolol and prescribed fiorecet for prn use. Advised f/u with primary neurologist as needed for further advise.  10. Chronic shoulder pain: Renewed Diclofenac patch prescription per her request  11. Deconditioning: Seen by PT/OT and recommended home health to assist with ADLs, going up the stairs, assistance with medication management etc.  Discussed with case management and home health referral placed.    Discharge Exam:   Vitals:   04/05/19 1500 04/05/19 1732  BP:    Pulse:  60  Resp:    Temp:    SpO2: 92% 92%   Vitals:   04/05/19 0900 04/05/19 1218 04/05/19 1500 04/05/19 1732  BP: 110/63 106/65    Pulse: 66 (!) 55  60  Resp: 18 18    Temp:  98.3 F (36.8 C)    TempSrc:  Oral    SpO2: 93% 95% 92% 92%  Weight:      Height:        General: Pt is alert, awake, not in acute distress Cardiovascular: RRR, S1/S2 +, no rubs, no gallops Respiratory: CTA bilaterally, no wheezing, no rhonchi Abdominal: Soft, NT, ND, bowel sounds + Extremities: no edema, no cyanosis  Discharge Condition:Stable CODE STATUS: Full code Diet recommendation: 2 gram sodium, diabetic Recommendations for Outpatient Follow-up:  1. Follow up with PCP: 1 week 2. Follow up with consultants: Pulmonary for PFTs, primary GI for nausea, primary neurologist for headaches 3. Please obtain follow up labs including: BG check  Home Health services upon discharge:  Equipment/Devices upon discharge:   Discharge Instructions:  Discharge Instructions    Call MD for:  difficulty breathing, headache or visual disturbances   Complete by: As directed    Call MD for:  extreme fatigue   Complete by: As directed    Call MD for:  persistant dizziness or light-headedness   Complete by: As directed    Call MD for:  temperature >100.4   Complete by: As directed    Diet - low sodium heart healthy   Complete by: As directed    Increase activity slowly   Complete by: As directed      Allergies as of 04/05/2019      Reactions   Bee Venom Anaphylaxis   Morphine And Related    Tape Rash   Rash - only use paper tape   Etodolac Diarrhea, Nausea And Vomiting   Morphine Other (See Comments)   Hallucinations   Other Itching, Swelling   Bee venom   Poison Ivy Extract  [poison Ivy Extract] Itching, Swelling   Propoxyphene Nausea And Vomiting   Propoxyphene N-acetaminophen Nausea And Vomiting   Penicillins Rash   Has patient had a PCN  reaction causing immediate rash, facial/tongue/throat swelling, SOB or lightheadedness with hypotension: Yes Has patient had a PCN reaction causing severe rash involving mucus membranes or skin necrosis: No Has patient had a PCN reaction that required hospitalization No Has patient had a PCN reaction occurring within the last 10 years: No If all of the above answers are "NO", then may proceed with Cephalosporin use.      Medication List    STOP taking these medications   diphenhydrAMINE 25 MG tablet Commonly known as: BENADRYL   glipiZIDE 10 MG 24 hr tablet Commonly known as: GLUCOTROL XL   meloxicam 15 MG tablet Commonly known as: MOBIC     TAKE these medications   albuterol 108 (90 Base) MCG/ACT inhaler Commonly known as: VENTOLIN HFA Inhale 1 puff into the lungs every 6 (six) hours as needed for wheezing or shortness of breath.   albuterol (2.5 MG/3ML) 0.083% nebulizer solution Commonly known as: PROVENTIL Take 2.5 mg by nebulization every 6 (six) hours as needed for wheezing or shortness of breath. Reported on 01/10/2016   apixaban 5 MG Tabs tablet Commonly known as: ELIQUIS Take 1 tablet (5 mg total) by mouth 2 (two) times daily. What changed:   medication strength  how much to take   benzonatate 100 MG capsule Commonly known as: TESSALON Take 1 capsule (100 mg total) by mouth 2 (two) times daily as needed for cough.   BOTOX IM Inject 200 Units into the muscle every 3 (three) months.   butalbital-acetaminophen-caffeine 50-325-40 MG tablet Commonly known as: FIORICET Take 1 tablet by mouth every 6 (six) hours as needed for headache or migraine.   clonazePAM 0.5 MG tablet Commonly known as: KLONOPIN Take 1 tablet (0.5 mg total) by mouth at bedtime as needed for anxiety.   cyanocobalamin 1000 MCG/ML injection Commonly known as: (VITAMIN B-12) Inject 1 mL into the skin every 30 (thirty) days.   diclofenac 1.3 % Ptch Commonly known as: FLECTOR Place 1 patch  onto the skin daily.   dicyclomine 20 MG tablet Commonly known as: BENTYL Take 20 mg by mouth every 6 (six) hours as needed for pain.   hydrOXYzine 25 MG tablet Commonly known as: ATARAX/VISTARIL Take 1 tablet (25 mg total) by mouth 3 (three) times daily as needed for anxiety, itching or nausea.   hyoscyamine 0.125 MG Tbdp disintergrating tablet Commonly known as: ANASPAZ Place 0.125 mg under the tongue every 4 (four) hours as needed for bladder spasms or cramping.   levothyroxine 25 MCG tablet Commonly known as: SYNTHROID Take 25 mcg by mouth every morning.   metoCLOPramide 5 MG tablet Commonly known as: REGLAN Take 1 tablet (5 mg total) by mouth 3 (three) times daily with meals.  mometasone-formoterol 100-5 MCG/ACT Aero Commonly known as: DULERA Inhale 2 puffs into the lungs 2 (two) times daily as needed for wheezing or shortness of breath.   Myrbetriq 50 MG Tb24 tablet Generic drug: mirabegron ER Take 50 mg by mouth at bedtime.   ondansetron 8 MG disintegrating tablet Commonly known as: ZOFRAN-ODT Take 8 mg by mouth daily as needed for nausea.   propranolol 60 MG tablet Commonly known as: INDERAL Take 60 mg by mouth 2 (two) times daily.   simethicone 80 MG chewable tablet Commonly known as: MYLICON Chew 1 tablet (80 mg total) by mouth 4 (four) times daily as needed for flatulence (belching).   sitaGLIPtin 25 MG tablet Commonly known as: Januvia Take 1 tablet (25 mg total) by mouth daily.   traZODone 100 MG tablet Commonly known as: DESYREL Take 1/2 to one tab at bed time as needed What changed:   how much to take  how to take this  when to take this  additional instructions   valACYclovir 1000 MG tablet Commonly known as: VALTREX Take 2,000 mg by mouth 2 (two) times daily as needed (Fever blisters). Reported on 01/10/2016   vortioxetine HBr 20 MG Tabs tablet Commonly known as: Trintellix Take 1 tablet (20 mg total) by mouth daily.             Durable Medical Equipment  (From admission, onward)         Start     Ordered   04/05/19 1554  For home use only DME oxygen  Once    Question Answer Comment  Length of Need Lifetime   Mode or (Route) Nasal cannula   Liters per Minute 3   Frequency Continuous (stationary and portable oxygen unit needed)   Oxygen conserving device Yes   Oxygen delivery system Gas      04/05/19 1553         Follow-up Information    Schedule an appointment as soon as possible for a visit  with [name], MD.   Specialty: Family Medicine Why: For evaluation of bloody diarrhea, shortness of breathe, urinary retention. Contact information: [place] [address] [address] [place], [facility] Follow up.   Specialty: Home Health Services Why: for home health Contact information: [place] [address] [phone]        [facility], [facility] Follow up.   Why: Manages home oxygen Contact information: [place] [address] [place] Follow up.   Why: For community social work and case IT sales professional information: [place]. [place] [address] [phone]         Allergies  Allergen Reactions  . Bee Venom Anaphylaxis  . Morphine And Related   . Tape Rash    Rash - only use paper tape  . Etodolac Diarrhea and Nausea And Vomiting  . Morphine Other (See Comments)    Hallucinations  . Other Itching and Swelling    Bee venom  . Poison Ivy Extract  [Poison Ivy Extract] Itching and Swelling  . Propoxyphene Nausea And Vomiting  . Propoxyphene N-Acetaminophen Nausea And Vomiting  . Penicillins Rash    Has patient had a PCN reaction causing immediate rash, facial/tongue/throat swelling, SOB or lightheadedness with hypotension: Yes Has patient had a PCN reaction causing severe rash involving mucus  membranes or  skin necrosis: No Has patient had a PCN reaction that required hospitalization No Has patient had a PCN reaction occurring within the last 10 years: No If all of the above answers are "NO", then may proceed with Cephalosporin use.       The results of significant diagnostics from this hospitalization (including imaging, microbiology, ancillary and laboratory) are listed below for reference.    Labs: BNP (last 3 results) Recent Labs    03/31/19 1531  BNP 02.7   Basic Metabolic Panel: Recent Labs  Lab 03/31/19 1611 04/01/19 0407 04/04/19 0359  NA 141 139  --   K 4.2 3.6  --   CL 104 102  --   CO2 27 28  --   GLUCOSE 92 180*  --   BUN 6* 7*  --   CREATININE 0.75 0.84 0.71  CALCIUM 8.9 8.7*  --    Liver Function Tests: Recent Labs  Lab 03/31/19 1611  AST 15  ALT 8  ALKPHOS 54  BILITOT 0.8  PROT 6.9  ALBUMIN 3.7   No results for input(s): LIPASE, AMYLASE in the last 168 hours. No results for input(s): AMMONIA in the last 168 hours. CBC: Recent Labs  Lab 03/31/19 1503 04/01/19 0407 04/02/19 0500  WBC 5.0 4.7 4.7  NEUTROABS 3.0 2.4  --   HGB 13.6 12.9 12.6  HCT 42.5 41.6 41.0  MCV 87.1 89.5 89.1  PLT 248 228 211   Cardiac Enzymes: No results for input(s): CKTOTAL, CKMB, CKMBINDEX, TROPONINI in the last 168 hours. BNP: Invalid input(s): POCBNP CBG: Recent Labs  Lab 04/04/19 1655 04/04/19 2147 04/05/19 0613 04/05/19 1113 04/05/19 1608  GLUCAP 147* 136* 117* 128* 160*   D-Dimer No results for input(s): DDIMER in the last 72 hours. Hgb A1c No results for input(s): HGBA1C in the last 72 hours. Lipid Profile No results for input(s): CHOL, HDL, LDLCALC, TRIG, CHOLHDL, LDLDIRECT in the last 72 hours. Thyroid function studies No results for input(s): TSH, T4TOTAL, T3FREE, THYROIDAB in the last 72 hours.  Invalid input(s): FREET3 Anemia work up No results for input(s): VITAMINB12, FOLATE, FERRITIN, TIBC, IRON, RETICCTPCT in the last 72  hours. Urinalysis    Component Value Date/Time   COLORURINE YELLOW 03/31/2019 1551   APPEARANCEUR CLEAR 03/31/2019 1551   APPEARANCEUR CLOUDY 12/19/2014 0730   LABSPEC 1.008 03/31/2019 1551   LABSPEC 1.025 12/19/2014 0730   PHURINE 7.0 03/31/2019 1551   GLUCOSEU NEGATIVE 03/31/2019 1551   GLUCOSEU see comment 12/19/2014 0730   HGBUR NEGATIVE 03/31/2019 [place] 03/31/2019 1551   BILIRUBINUR n 05/08/2016 1506   BILIRUBINUR see comment 12/19/2014 0730   KETONESUR NEGATIVE 03/31/2019 1551   PROTEINUR NEGATIVE 03/31/2019 1551   UROBILINOGEN negative 05/08/2016 1506   UROBILINOGEN 1.0 11/30/2014 1215   NITRITE NEGATIVE 03/31/2019 1551   LEUKOCYTESUR NEGATIVE 03/31/2019 1551   LEUKOCYTESUR see comment 12/19/2014 0730   Sepsis Labs Invalid input(s): PROCALCITONIN,  WBC,  LACTICIDVEN Microbiology Recent Results (from the past 240 hour(s))  SARS Coronavirus 2 (CEPHEID - Performed in [place] hospital lab), Hosp Order     Status: None   Collection Time: 03/31/19 10:30 PM   Specimen: Nasopharyngeal Swab  Result Value Ref Range Status   SARS Coronavirus 2 NEGATIVE NEGATIVE Final    Comment: (NOTE) If result is NEGATIVE SARS-CoV-2 target nucleic acids are NOT DETECTED. The SARS-CoV-2 RNA is generally detectable in upper and lower  respiratory specimens during the acute phase of infection. The lowest  concentration of  SARS-CoV-2 viral copies this assay can detect is 250  copies / mL. A negative result does not preclude SARS-CoV-2 infection  and should not be used as the sole basis for treatment or other  patient management decisions.  A negative result may occur with  improper specimen collection / handling, submission of specimen other  than nasopharyngeal swab, presence of viral mutation(s) within the  areas targeted by this assay, and inadequate number of viral copies  (<250 copies / mL). A negative result must be combined with clinical  observations, patient  history, and epidemiological information. If result is POSITIVE SARS-CoV-2 target nucleic acids are DETECTED. The SARS-CoV-2 RNA is generally detectable in upper and lower  respiratory specimens dur ing the acute phase of infection.  Positive  results are indicative of active infection with SARS-CoV-2.  Clinical  correlation with patient history and other diagnostic information is  necessary to determine patient infection status.  Positive results do  not rule out bacterial infection or co-infection with other viruses. If result is PRESUMPTIVE POSTIVE SARS-CoV-2 nucleic acids MAY BE PRESENT.   A presumptive positive result was obtained on the submitted specimen  and confirmed on repeat testing.  While 2019 novel coronavirus  (SARS-CoV-2) nucleic acids may be present in the submitted sample  additional confirmatory testing may be necessary for epidemiological  and / or clinical management purposes  to differentiate between  SARS-CoV-2 and other Sarbecovirus currently known to infect humans.  If clinically indicated additional testing with an alternate test  methodology [phone]) is advised. The SARS-CoV-2 RNA is generally  detectable in upper and lower respiratory sp ecimens during the acute  phase of infection. The expected result is Negative. Fact Sheet for Patients:  StrictlyIdeas.no Fact Sheet for Healthcare Providers: BankingDealers.co.za This test is not yet approved or cleared by the [place] FDA and has been authorized for detection and/or diagnosis of SARS-CoV-2 by FDA under an Emergency Use Authorization (EUA).  This EUA will remain in effect (meaning this test can be used) for the duration of the COVID-19 declaration under Section 564(b)(1) of the Act, 21 U.S.C. section 360bbb-3(b)(1), unless the authorization is terminated or revoked sooner. Performed at [facility], [place] [address], [place], [place] [address]      Procedures/Studies: Ct Angio Chest Pe W Or Wo Contrast  Result Date: 04/01/2019 CLINICAL DATA:  PE suspected. History of pulmonary embolism. Acute onset chest discomfort, shortness of breath and near syncope. Recently missed doses of anticoagulation. EXAM: CT ANGIOGRAPHY CHEST WITH CONTRAST TECHNIQUE: Multidetector CT imaging of the chest was performed using the standard protocol during bolus administration of intravenous contrast. Multiplanar CT image reconstructions and MIPs were obtained to evaluate the vascular anatomy. CONTRAST:  138mL OMNIPAQUE IOHEXOL 350 MG/ML SOLN COMPARISON:  CTA chest 11/17/2017 FINDINGS: Cardiovascular: Satisfactory opacification of the pulmonary arteries to the segmental level. Respiratory motion may limit detection of smaller subsegmental pulmonary emboli. No large central or segmental pulmonary embolus. Normal heart size. No pericardial effusion. Atherosclerotic plaque within the normal caliber aorta. Atherosclerotic calcification of the coronary arteries. Mild tortuosity of the right brachiocephalic vessels. Mediastinum/Nodes: No enlarged mediastinal or axillary lymph nodes. Thyroid gland, trachea, and esophagus demonstrate no significant findings. Lungs/Pleura: No consolidation, features of edema, pneumothorax, or effusion. Bandlike atelectatic changes are present at both bases. Geographic regions of mosaic attenuation are likely related to imaging during exhalation. No suspicious pulmonary nodules or masses. Upper Abdomen: No acute abnormalities present in the visualized portions of the upper abdomen. Scattered colonic  diverticula without focal pericolonic inflammation to suggest diverticulitis. Musculoskeletal: Multilevel degenerative changes are present in the imaged portions of the spine. Changes are similar to comparison CT dated 11/17/2017. Bilateral breast prostheses are noted with stable capsular calcification of the right implant. Thoracic nerve stimulator is  visualized on scout images with battery pack overlying the right upper quadrant. IMPRESSION: 1. No evidence of large central or segmental pulmonary embolus. Respiratory motion may limit detection of smaller subsegmental pulmonary emboli. 2. No acute intrathoracic process. 3. Coronary atherosclerosis. 4.  Aortic Atherosclerosis (ICD10-I70.0). Electronically Signed   By: Lovena Le M.D.   On: 04/01/2019 21:41   Ct Abdomen Pelvis W Contrast  Result Date: 03/31/2019 CLINICAL DATA:  Central chest pain.  Abdominal pain, generalized. EXAM: CT ABDOMEN AND PELVIS WITH CONTRAST TECHNIQUE: Multidetector CT imaging of the abdomen and pelvis was performed using the standard protocol following bolus administration of intravenous contrast. CONTRAST:  163mL OMNIPAQUE IOHEXOL 300 MG/ML  SOLN COMPARISON:  11/17/2017 FINDINGS: Lower chest: Linear scarring in the lung bases. No effusions. Heart is normal size. Hepatobiliary: No focal liver abnormality is seen. Status post cholecystectomy. No biliary dilatation. Pancreas: No focal abnormality or ductal dilatation. Spleen: No focal abnormality.  Normal size. Adrenals/Urinary Tract: No adrenal abnormality. No focal renal abnormality. No stones or hydronephrosis. Urinary bladder is unremarkable. Stomach/Bowel: Diffuse colonic diverticulosis, most pronounced in the descending colon and sigmoid colon. No evidence of active diverticulitis. Stomach and small bowel decompressed, unremarkable. Vascular/Lymphatic: Aortic atherosclerosis. No enlarged abdominal or pelvic lymph nodes. Reproductive: Prior hysterectomy.  No adnexal masses. Other: No free fluid or free air. Musculoskeletal: No acute bony abnormality. Degenerative disc and facet disease in the lumbar spine. IMPRESSION: No acute findings in the abdomen or pelvis. Colonic diverticulosis.  No changes of active diverticulitis. Aortic atherosclerosis. Bibasilar scarring. Electronically Signed   By: Rolm Baptise M.D.   On: 03/31/2019  18:55   Dg Chest Portable 1 View  Result Date: 03/31/2019 CLINICAL DATA:  Shortness of breath, chest discomfort. EXAM: PORTABLE CHEST 1 VIEW COMPARISON:  Chest radiographs 11/20/2017, 11/19/2017, CT angiography chest 11/17/2017 FINDINGS: Body habitus and abundant apical fat pad likely contribute to the increased hazy opacity in both lung bases. There are chronic interstitial changes and airways thickening similar to prior exams. No acute consolidation, effusion or pneumothorax is evident. Cardiomegaly is similar to prior studies. Degenerative changes are present in the spine and shoulders. Thoracic nerve stimulator is seen. Cardiac monitoring leads overlie the chest. Necklace is noted at the base of the neck. IMPRESSION: Chronic interstitial changes and airways thickening, similar to prior studies. No definite acute cardiopulmonary process. Electronically Signed   By: Lovena Le M.D.   On: 03/31/2019 18:01    Time coordinating discharge: Over 30 minutes  SIGNED:   [place] Shi, MD  [facility] 04/05/2019, 5:53 PM Pager : [phone]

## 2019-04-05 NOTE — Progress Notes (Signed)
Patient placed on overnight pulse oximetry, RN informed. Will continue to monitor patient.

## 2019-04-05 NOTE — Progress Notes (Signed)
PROGRESS NOTE    Hayley [name]  TDD:[id number]  DOB: Sep 12, 1946  DOA: 03/31/2019 PCP: [name], MD  Brief Narrative: 73 year old female with history of recurrent DVT/PEs who has been on chronic anticoagulation with Eliquis, depression/anxiety,  hypothyroidism, history of CVA, chronic migraines, diabetes mellitus,chronic bronchitis?  Asthma, obstructive sleep apnea noncompliant with home CPAP, presented to the ED with complaints of exertional dyspnea and vague complaints of chest/abdominal discomfort.  She also reported poor appetite/intermittent diarrhea over the last couple of weeks.  She apparently did not fill her medications including Eliquis and missed couple of doses.  She states her health has declined since her husband passed away recently. She reports seeing a pulmonologist with [facility] and undergoing PFTs in the past.  Denies history of COPD or history of smoking since her college years.  She states she has felt dyspneic mostly on exertion.  Patient was found to be hypoxic on presentation with O2 sat in high 70s to low 80s on exertion.  Other vital signs were stable.  EKG was unremarkable.  Cyclic troponins negative.  BNP WNL.  Chest x-ray showed chronic interstitial changes similar to prior.  CT abdomen/pelvis was also obtained which was negative for any acute abnormalities.  Patient was placed on O2 3 L nasal cannula and admitted to hospitalist service for further evaluation.  CT chest with contrast was obtained on July 15 which was negative for PE.  No other lung abnormalities reported except for bibasilar atelectasis.    Subjective:  Patient appears anxious but no evidence of tachypnea or respiratory distress.  She is able to talk full sentences without shortness of breath.  She does have 2 L O2 nasal cannula on. She states she has had decline in functional status over the last few months since her husband got sick and recently passed away.  She states her daughter and son-in-law  live in the area but have busy work schedule.  Her son apparently lives in [place].  She states she is unable to perform her activities of daily living without feeling dyspneic and tired.  Objective: Vitals:   04/04/19 2149 04/04/19 2151 04/05/19 0405 04/05/19 0800  BP: 109/73  (!) 104/59   Pulse: (!) 58 62 (!) 56   Resp: 18  18   Temp: 98.6 F (37 C)  97.7 F (36.5 C)   TempSrc: Oral  Oral   SpO2: 95%  91% 92%  Weight:   94 kg   Height:        Intake/Output Summary (Last 24 hours) at 04/05/2019 0848 Last data filed at 04/05/2019 0650 Gross per 24 hour  Intake 657 ml  Output 1800 ml  Net -1143 ml   Filed Weights   04/03/19 0613 04/04/19 0431 04/05/19 0405  Weight: 93.5 kg 94 kg 94 kg    Physical Examination:  General exam: Appears anxious but comfortable without respiratory distress Respiratory system: Clear to auscultation except for decreased breath sounds at bases. Respiratory effort normal. Cardiovascular system: S1 & S2 heard, RRR. No JVD, murmurs, rubs, gallops or clicks. No pedal edema. Gastrointestinal system: Abdomen is obese, nondistended, soft and nontender. No organomegaly or masses felt. Normal bowel sounds heard. Central nervous system: Alert and oriented. No focal neurological deficits. Extremities: Symmetric 5 x 5 power. Skin: No rashes, lesions or ulcers Psychiatry: Judgement and insight appear normal. Mood & affect appropriate.     Data Reviewed: I have personally reviewed following labs and imaging studies  CBC: Recent Labs  Lab 03/31/19  1503 04/01/19 0407 04/02/19 0500  WBC 5.0 4.7 4.7  NEUTROABS 3.0 2.4  --   HGB 13.6 12.9 12.6  HCT 42.5 41.6 41.0  MCV 87.1 89.5 89.1  PLT 248 228 397   Basic Metabolic Panel: Recent Labs  Lab 03/31/19 1611 04/01/19 0407 04/04/19 0359  NA 141 139  --   K 4.2 3.6  --   CL 104 102  --   CO2 27 28  --   GLUCOSE 92 180*  --   BUN 6* 7*  --   CREATININE 0.75 0.84 0.71  CALCIUM 8.9 8.7*  --    GFR:  Estimated Creatinine Clearance: 71.3 mL/min (by C-G formula based on SCr of 0.71 mg/dL). Liver Function Tests: Recent Labs  Lab 03/31/19 1611  AST 15  ALT 8  ALKPHOS 54  BILITOT 0.8  PROT 6.9  ALBUMIN 3.7   No results for input(s): LIPASE, AMYLASE in the last 168 hours. No results for input(s): AMMONIA in the last 168 hours. Coagulation Profile: No results for input(s): INR, PROTIME in the last 168 hours. Cardiac Enzymes: No results for input(s): CKTOTAL, CKMB, CKMBINDEX, TROPONINI in the last 168 hours. BNP (last 3 results) No results for input(s): PROBNP in the last 8760 hours. HbA1C: No results for input(s): HGBA1C in the last 72 hours. CBG: Recent Labs  Lab 04/04/19 0609 04/04/19 1119 04/04/19 1655 04/04/19 2147 04/05/19 0613  GLUCAP 98 104* 147* 136* 117*   Lipid Profile: No results for input(s): CHOL, HDL, LDLCALC, TRIG, CHOLHDL, LDLDIRECT in the last 72 hours. Thyroid Function Tests: No results for input(s): TSH, T4TOTAL, FREET4, T3FREE, THYROIDAB in the last 72 hours. Anemia Panel: No results for input(s): VITAMINB12, FOLATE, FERRITIN, TIBC, IRON, RETICCTPCT in the last 72 hours. Sepsis Labs: No results for input(s): PROCALCITON, LATICACIDVEN in the last 168 hours.  Recent Results (from the past 240 hour(s))  SARS Coronavirus 2 (CEPHEID - Performed in [place] hospital lab), Hosp Order     Status: None   Collection Time: 03/31/19 10:30 PM   Specimen: Nasopharyngeal Swab  Result Value Ref Range Status   SARS Coronavirus 2 NEGATIVE NEGATIVE Final    Comment: (NOTE) If result is NEGATIVE SARS-CoV-2 target nucleic acids are NOT DETECTED. The SARS-CoV-2 RNA is generally detectable in upper and lower  respiratory specimens during the acute phase of infection. The lowest  concentration of SARS-CoV-2 viral copies this assay can detect is 250  copies / mL. A negative result does not preclude SARS-CoV-2 infection  and should not be used as the sole basis for  treatment or other  patient management decisions.  A negative result may occur with  improper specimen collection / handling, submission of specimen other  than nasopharyngeal swab, presence of viral mutation(s) within the  areas targeted by this assay, and inadequate number of viral copies  (<250 copies / mL). A negative result must be combined with clinical  observations, patient history, and epidemiological information. If result is POSITIVE SARS-CoV-2 target nucleic acids are DETECTED. The SARS-CoV-2 RNA is generally detectable in upper and lower  respiratory specimens dur ing the acute phase of infection.  Positive  results are indicative of active infection with SARS-CoV-2.  Clinical  correlation with patient history and other diagnostic information is  necessary to determine patient infection status.  Positive results do  not rule out bacterial infection or co-infection with other viruses. If result is PRESUMPTIVE POSTIVE SARS-CoV-2 nucleic acids MAY BE PRESENT.   A presumptive positive result  was obtained on the submitted specimen  and confirmed on repeat testing.  While 2019 novel coronavirus  (SARS-CoV-2) nucleic acids may be present in the submitted sample  additional confirmatory testing may be necessary for epidemiological  and / or clinical management purposes  to differentiate between  SARS-CoV-2 and other Sarbecovirus currently known to infect humans.  If clinically indicated additional testing with an alternate test  methodology [phone]) is advised. The SARS-CoV-2 RNA is generally  detectable in upper and lower respiratory sp ecimens during the acute  phase of infection. The expected result is Negative. Fact Sheet for Patients:  StrictlyIdeas.no Fact Sheet for Healthcare Providers: BankingDealers.co.za This test is not yet approved or cleared by the [place] FDA and has been authorized for detection and/or  diagnosis of SARS-CoV-2 by FDA under an Emergency Use Authorization (EUA).  This EUA will remain in effect (meaning this test can be used) for the duration of the COVID-19 declaration under Section 564(b)(1) of the Act, 21 U.S.C. section 360bbb-3(b)(1), unless the authorization is terminated or revoked sooner. Performed at [facility], [place] [address] [place], [place] [address]       Radiology Studies: No results found.      Scheduled Meds: . apixaban  5 mg Oral BID  . benzonatate  100 mg Oral BID  . diclofenac  1 patch Transdermal Daily  . doxycycline  100 mg Oral Q12H  . insulin aspart  0-5 Units Subcutaneous QHS  . insulin aspart  0-9 Units Subcutaneous TID WC  . levothyroxine  25 mcg Oral Q0600  . loratadine  10 mg Oral Daily  . mirabegron ER  50 mg Oral QHS  . mometasone-formoterol  2 puff Inhalation BID  . propranolol  60 mg Oral BID  . simethicone  80 mg Oral QID  . sodium chloride flush  3 mL Intravenous Q12H  . sodium chloride flush  3 mL Intravenous Q12H  . vortioxetine HBr  20 mg Oral Daily   Continuous Infusions: . sodium chloride      Assessment & Plan:    1.  Acute hypoxic respiratory failure: Present on admission.  Patient likely has multifactorial restrictive disease due to chronic basilar scarring of the lung/atelectasis and obstructive sleep apnea/obesity hypoventilation syndrome.  No acute issues noted on CT chest like PE or pneumonias although due to motion artifact, subsegmental PE could not be ruled out.  Patient has now been resumed on Eliquis. Patient underwent high-resolution CT chest in November 2018 showing mild scarring in the lingula and both lower lobes with associated volume loss.  She also had a contrasted CT in March showing atelectasis/basilar scarring.Continuing incentive spirometry.  She has refused to use CPAP at night here.  She states she has her own machine at home but she does not have the supplies like tubing/mask.  She  reports seeing a pulmonologist in the past with [facility] for PFTs as well as Dr. Chase Caller with [place] pulmonology.  She reports undergoing sleep study through the neurology clinic.  She can be resumed on home inhaler therapy and recommended to follow-up with pulmonary for possibly repeat sleep study/CPAP settings and repeat PFTs.  Patient also reports recurrent sinusitis and is worried about mold in her old house.  She is recommended to follow-up with her primary ENT.  Physical therapy did walk her yesterday and patient did not qualify for O2.  She was satting 92 to 93% upon walking about 400 feet.  Anxiety might be playing a role  as well.  Will add hydroxyzine.  She has not had any fever or chills but reports cough.  She is not on any antibiotics currently.  Continue antitussives, add empiric antibiotics for bronchitis.  May also benefit from outpatient stress test.  No evidence of pulmonary hypertension and echocardiogram showed normal EF  2.  Obstructive sleep apnea: Refusing CPAP while here.  Will discuss with family regarding contacting home CPAP company for arranging supplies.  Check for nocturnal O2 desat tonight.   3.  Recurrent DVT/PE: Resumed on anticoagulation.  4.  Type 2 diabetes mellitus: Resume home medications.  5.  Hypothyroidism: Resume home medications  6.?  History of CVA: Not on aspirin or statins.  Continue anticoagulation  7.  Depression/anxiety: Add hydroxyzine as needed.  Likely contributing to problem #1  8.  Chest discomfort/abdominal discomfort/diarrhea: Now resolved.  CT abdomen/pelvis on presentation unremarkable  9.  Deconditioning: Seen by PT/OT and recommended home health to assist with ADLs, going up the stairs, assistance with medication management etc.  Discussed with case management and home health referral placed.  DVT prophylaxis: On Eliquis Code Status: Full code Family / Patient Communication: Discussed with patient and son Corene Cornea Disposition Plan:  Home likely in a.m.     LOS: 4 days    Time spent: 35 minutes  FVC Pre-Actual 1.90 L  FVC %Pred-Pre 61.7 %  FVC LLN 2.36 L  FEV1 Pre-Actual 1.59 L  FEV1 %Pred-Pre 68.2 %  FEV1 LLN 1.74 L                                           Guilford Shi, MD [facility] Pager [phone]  If 7PM-7AM, please contact night-coverage [email or web address] Password [facility] 04/05/2019, 8:48 AM

## 2019-04-05 NOTE — Progress Notes (Signed)
Occupational Therapy Treatment Patient Details Name: SYNIAH BERNE MRN: [id number] DOB: Feb 03, 1946 Today's Date: 04/05/2019    History of present illness 73 y.o. female admitted with chest discomfort, shortness of breath, and GI upset. PMhx: recurrent VTE now taking low-dose Eliquis inconsistently, suspected asthma or COPD, depression and anxiety, DM, hypothyroidism, CVA, and chronic migraines.   OT comments  Pt anxious about Dc plan and relying on friends upon DC.  Pt states daughter is in town but not helpful  Follow Up Recommendations  Home health OT;Supervision - Intermittent    Equipment Recommendations  None recommended by OT    Recommendations for Other Services      Precautions / Restrictions Precautions Precautions: None       Mobility Bed Mobility Overal bed mobility: Modified Independent                Transfers Overall transfer level: Modified independent   Transfers: Sit to/from Stand;Stand Pivot Transfers Sit to Stand: Min guard;Supervision Stand pivot transfers: Supervision;Min guard            Balance Overall balance assessment: Mild deficits observed, not formally tested                                         ADL either performed or assessed with clinical judgement   ADL Overall ADL's : Needs assistance/impaired             Lower Body Bathing: Minimal assistance;Cueing for safety;Cueing for sequencing   Upper Body Dressing : Set up;Sitting   Lower Body Dressing: Minimal assistance;Sit to/from stand;Cueing for compensatory techniques;Cueing for safety;Cueing for sequencing   Toilet Transfer: Min guard;Cueing for safety   Toileting- Clothing Manipulation and Hygiene: Min guard       Functional mobility during ADLs: Min guard General ADL Comments: Pt overall min guard - S with ADL activity.  Pt reported being anxious this day     Vision Patient Visual Report: No change from baseline            Cognition  Arousal/Alertness: Awake/alert Behavior During Therapy: WFL for tasks assessed/performed Overall Cognitive Status: Within Functional Limits for tasks assessed                                               Frequency  Min 2X/week        Progress Toward Goals  OT Goals(current goals can now be found in the care plan section)  Progress towards OT goals: Progressing toward goals     Plan Discharge plan remains appropriate       AM-PAC OT "6 Clicks" Daily Activity     Outcome Measure   Help from another person eating meals?: None Help from another person taking care of personal grooming?: A Little Help from another person toileting, which includes using toliet, bedpan, or urinal?: A Little Help from another person bathing (including washing, rinsing, drying)?: A Little Help from another person to put on and taking off regular upper body clothing?: A Little Help from another person to put on and taking off regular lower body clothing?: A Little 6 Click Score: 19    End of Session Equipment Utilized During Treatment: Gait belt  OT Visit Diagnosis: Unsteadiness on feet (R26.81);Other abnormalities of gait and  mobility (R26.89);Muscle weakness (generalized) (M62.81)   Activity Tolerance Patient tolerated treatment well   Patient Left in chair;with call bell/phone within reach   Nurse Communication Mobility status        Time: 8676-7209 OT Time Calculation (min): 15 min  Charges: OT General Charges $OT Visit: 1 Visit OT Treatments $Self Care/Home Management : 8-22 mins  Kari Baars, [place] [phone] Office- Jones, Edwena Felty D 04/05/2019, 12:05 PM

## 2019-04-06 ENCOUNTER — Other Ambulatory Visit: Payer: Self-pay | Admitting: *Deleted

## 2019-04-06 NOTE — Patient Outreach (Signed)
[facility]) Care Management  04/06/2019  JOLISSA KAPRAL October 15, 1945 [id number]   Covering for assigned RNCM, referral received from inpatient case manager as member was recently discharged from hospital after being admitted 7/14-7/19 with respiratory failure due to hypoxemia.  Primary MD office will complete transition of care assessment.  Per chart, she also has history of diabetes (controlled, A1C - 6.4), hypertension, OSA, asthma, GERD, diverticulosis, and anxiety/depression.  Call placed to member for assessment, identity verified.  This care manager introduced self and stated purpose of call.  [facility] care management services explained.  Member lives alone as her husband recently passed away this past Dec 18, 2022.  She has a daughter that lives in [place] but report she is "very busy."  Has a son that lives in [place].  Report her daughter is coming over this weekend to deliver some groceries and to secure someone to mow her lawn.  Member denies having other friends or relatives that are able to provide support during her recovery.  Denies feeling well enough to prepare meals, requesting assistance.  State when husband was living they had assistance from an agency called [employer] through the [place] system.  Report she is eligible for VA benefits but has not completed paperwork.  Also state she has Medicaid.  Verbalizes frustration regarding financial concerns.  State she hasn't been able to pay her mortgage since October 2019 when her husband became ill.  She is open to speaking with social worker to discuss resources.  Discharged home on oxygen, which is new.  Report equipment not working properly, but she has placed call to agency for assistance.  Has pulse oximeter to check oxygen levels, report 93% currently.  Denies any shortness of breath.  She also has home health ordered with [facility], they will make home visit within the next couple days.    Medications reviewed, denies getting new  prescriptions as total cost if $50, which she denies having.  Agrees to discussing options with pharmacist.    Follow up appointment scheduled with primary MD for 7/29, state she was told that she is able to drive and will provide her own transportation.  Denies any urgent concerns at this time.    Will place referral to [facility] pharmacist. Will place referral to social worker. Will provide update to assigned case manager to follow up within the next 2 weeks.  Fall Risk  04/06/2019  Falls in the past year? 1  Number falls in past yr: 0  Injury with Fall? 0   Depression screen PHQ 2/9 04/06/2019  Decreased Interest 1  Down, Depressed, Hopeless 2  PHQ - 2 Score 3  Altered sleeping 1  Tired, decreased energy 1  Change in appetite 1  Feeling bad or failure about yourself  0  Trouble concentrating 1  Moving slowly or fidgety/restless 0  Suicidal thoughts 0  PHQ-9 Score 7  Difficult doing work/chores Somewhat difficult  Some recent data might be hidden   [facility] CM Care Plan Problem One     Most Recent Value  Care Plan Problem One  Risk for readmission related to difficulty breathing as evidenced by recent hospitalization for hypoxia  Role Documenting the Problem One  Care Management [place] for Problem One  Active  [facility] Long Term Goal   Member will not be readmitted to hospital within the next 31 days  [facility] Long Term Goal Start Date  04/06/19  Interventions for Problem One Long Term Goal  Discharge instructions  reviewed with member.  Advised of importance of following plan of care in effort to decrease risk of readmission.  Call placed to [employer] to discuss issues with oxygen equipment  [facility] CM Short Term Goal #1   Member will keep and attend follow up appointment with primary MD within the next 2 weeks  [facility] CM Short Term Goal #1 Start Date  04/06/19  Interventions for Short Term Goal #1  Upcoming appointment reviewed, assessed the need for transportation.  She will drive  herself  [facility] CM Short Term Goal #2   Member will report taking medications as instructed over the next 3-4 weeks  [facility] CM Short Term Goal #2 Start Date  04/06/19  Interventions for Short Term Goal #2  Reviewed medications at discharge.  Referral placed to [facility] pharmacist for med review and assistance  [facility] CM Short Term Goal #3  Member will report plan for increased support (financial and/or meals) within the next 2 weeks  [facility] CM Short Term Goal #3 Start Date  04/06/19  Interventions for Short [place] Goal #3  Referral placed to Education officer, museum for community resources and plan for financial assistance     [name], Therapist, sports, MSN [place] Manager [phone]

## 2019-04-07 ENCOUNTER — Other Ambulatory Visit: Payer: Self-pay | Admitting: *Deleted

## 2019-04-07 NOTE — Patient Outreach (Signed)
[facility]) Care Management  04/07/2019  BRYTNI DRAY Sep 26, 1945 [id number]   Voice message received from member last night stating she still had not received a call back from agency regarding her oxygen equipment.  Stated she will have to go through the night without any oxygen.  Call placed to member this morning to follow up on contact with company, she denies receiving any update as of yet.  Outreach made to [employer] regarding oxygen concerns, spoke with [place].  He state member is on list to have equipment replaced tomorrow.  Advised that member has been without oxygen since yesterday afternoon, request for urgent visit and replacement today.  He has placed this request, will contact this care manager with update.  Call then placed back to member to provide update and to inquire about oxygen levels this morning, no answer.  HIPAA compliant voice message left.  Will follow up within the next 24 hours.  [name], [place], MSN [place] [phone]

## 2019-04-08 ENCOUNTER — Other Ambulatory Visit: Payer: Self-pay | Admitting: *Deleted

## 2019-04-08 ENCOUNTER — Other Ambulatory Visit: Payer: Self-pay

## 2019-04-08 DIAGNOSIS — J45909 Unspecified asthma, uncomplicated: Secondary | ICD-10-CM | POA: Diagnosis not present

## 2019-04-08 DIAGNOSIS — E1141 Type 2 diabetes mellitus with diabetic mononeuropathy: Secondary | ICD-10-CM | POA: Diagnosis not present

## 2019-04-08 DIAGNOSIS — M48061 Spinal stenosis, lumbar region without neurogenic claudication: Secondary | ICD-10-CM | POA: Diagnosis not present

## 2019-04-08 DIAGNOSIS — J9601 Acute respiratory failure with hypoxia: Secondary | ICD-10-CM | POA: Diagnosis not present

## 2019-04-08 DIAGNOSIS — J383 Other diseases of vocal cords: Secondary | ICD-10-CM | POA: Diagnosis not present

## 2019-04-08 DIAGNOSIS — H15101 Unspecified episcleritis, right eye: Secondary | ICD-10-CM | POA: Diagnosis not present

## 2019-04-08 DIAGNOSIS — K219 Gastro-esophageal reflux disease without esophagitis: Secondary | ICD-10-CM | POA: Diagnosis not present

## 2019-04-08 DIAGNOSIS — H04123 Dry eye syndrome of bilateral lacrimal glands: Secondary | ICD-10-CM | POA: Diagnosis not present

## 2019-04-08 DIAGNOSIS — R011 Cardiac murmur, unspecified: Secondary | ICD-10-CM | POA: Diagnosis not present

## 2019-04-08 DIAGNOSIS — K589 Irritable bowel syndrome without diarrhea: Secondary | ICD-10-CM | POA: Diagnosis not present

## 2019-04-08 DIAGNOSIS — M25519 Pain in unspecified shoulder: Secondary | ICD-10-CM | POA: Diagnosis not present

## 2019-04-08 DIAGNOSIS — E662 Morbid (severe) obesity with alveolar hypoventilation: Secondary | ICD-10-CM | POA: Diagnosis not present

## 2019-04-08 DIAGNOSIS — F322 Major depressive disorder, single episode, severe without psychotic features: Secondary | ICD-10-CM | POA: Diagnosis not present

## 2019-04-08 DIAGNOSIS — M5136 Other intervertebral disc degeneration, lumbar region: Secondary | ICD-10-CM | POA: Diagnosis not present

## 2019-04-08 DIAGNOSIS — K5730 Diverticulosis of large intestine without perforation or abscess without bleeding: Secondary | ICD-10-CM | POA: Diagnosis not present

## 2019-04-08 DIAGNOSIS — E039 Hypothyroidism, unspecified: Secondary | ICD-10-CM | POA: Diagnosis not present

## 2019-04-08 DIAGNOSIS — G894 Chronic pain syndrome: Secondary | ICD-10-CM | POA: Diagnosis not present

## 2019-04-08 DIAGNOSIS — G250 Essential tremor: Secondary | ICD-10-CM | POA: Diagnosis not present

## 2019-04-08 DIAGNOSIS — F419 Anxiety disorder, unspecified: Secondary | ICD-10-CM | POA: Diagnosis not present

## 2019-04-08 DIAGNOSIS — M7918 Myalgia, other site: Secondary | ICD-10-CM | POA: Diagnosis not present

## 2019-04-08 DIAGNOSIS — G43709 Chronic migraine without aura, not intractable, without status migrainosus: Secondary | ICD-10-CM | POA: Diagnosis not present

## 2019-04-08 DIAGNOSIS — G4733 Obstructive sleep apnea (adult) (pediatric): Secondary | ICD-10-CM | POA: Diagnosis not present

## 2019-04-08 DIAGNOSIS — I700 Atherosclerosis of aorta: Secondary | ICD-10-CM | POA: Diagnosis not present

## 2019-04-08 DIAGNOSIS — M4120 Other idiopathic scoliosis, site unspecified: Secondary | ICD-10-CM | POA: Diagnosis not present

## 2019-04-08 DIAGNOSIS — D689 Coagulation defect, unspecified: Secondary | ICD-10-CM | POA: Diagnosis not present

## 2019-04-08 NOTE — Patient Outreach (Signed)
[facility]) Care Management  04/08/2019  Hayley [name] 01-06-1946 [id number]   Call placed to member to follow up on contact with [employer] regarding oxygen equipment.  She report she spoke with the representative and they will be out tomorrow to switch the equipment.  Denies any shortness of breath at this time, state oxygen levels remain no less than 93%.  Confirms she did have [place] visit today for assessment, no urgent needs identified.  Will provide assigned RNCM with update to follow up within the next 2 weeks.  [name], [place], MSN [place] [phone]

## 2019-04-08 NOTE — Patient Outreach (Signed)
[facility]) Care Management  04/08/2019  LETECIA ARPS 1946-04-15 [id number]   Social work referral received from [facility], [employer]. "Patient in need of financial resources for bills, has not paid mortgage since 08/04/18. Husband passed away in January 03, 2019. Now lives alone, son lives in [place], daughter is "busy." Requesting help with meals as well as overall support. State she has Medicaid and will soon have VA benefits but has not completed paperwork. Has history of anxiety, report seeing psychiatrist."  Successful outreach to patient today.  Patient did report that she has not made a mortgage payment since 2023-08-05.  She received a message from a representative from the collections agency yesterday.  She called back and left her a message but has not received return call.  She reports that she and her spouse applied for "some sort of assistance" prior to his passing, possibly the [employer] but she was not certain.  BSW and patient discussed a few possible options for assistance, however, it was decided that these options will not be officially explored until she is able to speak with collections.  She agreed to contact me once she had more information.   Per patient, she has left two voicemail messages with the local [place] office because she needs to "clarify some things" about her eligibility for benefits.   BSW inquired about family and other support.  Patient has daughter/son-in-law in [place] and son in [place].  She reports that her children do not have the means to assist her financially and that her daughter is "extremely busy" so it is difficult for her to find the time to help patient navigate processes of seeking assistance.    She does have a church community and a couple of "dear friends" that could potentially provide support but they are hesitant to come to her home due to [place].  Per patient, daughter is going to get groceries for her on  Friday.  She reports having difficulty preparing meals for herself at this time.  BSW and patient discussed Meals on Wheels program.  Per patient, her spouse received delivered meals through program prior to his passing and she was told that she could be linked to program due to their home already being on route.  BSW agreed to contact Francene Finders with [employer] about this but informed patient that she will likely have to be added to the wait list.  She did consent to a referral to the [place] which will allow her to receive a total of 28 meals over the next four weeks.   Will follow up with patient when first meal delivery date is confirmed.    Ronn Melena, BSW Social Worker [phone]

## 2019-04-09 ENCOUNTER — Other Ambulatory Visit: Payer: Self-pay

## 2019-04-09 DIAGNOSIS — E039 Hypothyroidism, unspecified: Secondary | ICD-10-CM | POA: Diagnosis not present

## 2019-04-09 DIAGNOSIS — E662 Morbid (severe) obesity with alveolar hypoventilation: Secondary | ICD-10-CM | POA: Diagnosis not present

## 2019-04-09 DIAGNOSIS — E1141 Type 2 diabetes mellitus with diabetic mononeuropathy: Secondary | ICD-10-CM | POA: Diagnosis not present

## 2019-04-09 DIAGNOSIS — J45909 Unspecified asthma, uncomplicated: Secondary | ICD-10-CM | POA: Diagnosis not present

## 2019-04-09 DIAGNOSIS — J9601 Acute respiratory failure with hypoxia: Secondary | ICD-10-CM | POA: Diagnosis not present

## 2019-04-09 DIAGNOSIS — F322 Major depressive disorder, single episode, severe without psychotic features: Secondary | ICD-10-CM | POA: Diagnosis not present

## 2019-04-09 NOTE — Patient Outreach (Signed)
[facility]) Care Management  04/09/2019  Hayley [name] May 05, 1946 [id number]   Attempted to contact patient x2 to inform her that first meal delivery through [facility] is scheduled for Saturday, 04/11/19.  Left voicemail message. Will attempt to reach her again tomorrow.  Ronn Melena, BSW Social Worker [phone]

## 2019-04-10 ENCOUNTER — Other Ambulatory Visit: Payer: Self-pay

## 2019-04-10 ENCOUNTER — Ambulatory Visit: Payer: Self-pay

## 2019-04-10 NOTE — Patient Outreach (Signed)
[facility]) Care Management  04/10/2019  Hayley [name] 23-Mar-1946 [id number]   Attempted to contact patient again today to inform her that first meal delivery through [place] is scheduled for Saturday, 04/11/19.  Left voicemail message.   Ronn Melena, BSW Social Worker [phone]

## 2019-04-13 ENCOUNTER — Encounter (HOSPITAL_COMMUNITY): Payer: Self-pay | Admitting: Psychiatry

## 2019-04-13 ENCOUNTER — Other Ambulatory Visit: Payer: Self-pay

## 2019-04-13 ENCOUNTER — Ambulatory Visit (INDEPENDENT_AMBULATORY_CARE_PROVIDER_SITE_OTHER): Payer: Medicare Other | Admitting: Psychiatry

## 2019-04-13 DIAGNOSIS — F4321 Adjustment disorder with depressed mood: Secondary | ICD-10-CM

## 2019-04-13 DIAGNOSIS — F322 Major depressive disorder, single episode, severe without psychotic features: Secondary | ICD-10-CM

## 2019-04-13 DIAGNOSIS — F411 Generalized anxiety disorder: Secondary | ICD-10-CM

## 2019-04-13 MED ORDER — VORTIOXETINE HBR 20 MG PO TABS: 20.0000 mg | ORAL_TABLET | Freq: Every day | ORAL | 0 refills | Status: DC

## 2019-04-13 MED ORDER — TRAZODONE HCL 100 MG PO TABS: ORAL_TABLET | ORAL | 0 refills | Status: DC

## 2019-04-13 NOTE — Patient Outreach (Signed)
[facility]) Care Management  04/13/2019  Hayley [name] 1946-05-04 [id number]  Called member at preferred number and member answered phone. Introduced self and explained reason for the call. HIPPA identifiers verified. Introduced self to member and explained reason for the call.  Member confirmed that the equipment for her oxygen was switched last week. Member denies any oxygen needs at this time. Member verbalized agreement for follow up call within next 2 weeks to complete initial assessment as she has PCP appointment scheduled for tomorrow.   Will follow up with member within next 2 weeks as scheduled with member.  Hayley Mola "ANN" [name], RN-BSN  [facility] Care Management  Community Care Management Coordinator  [phone] [email or web address]

## 2019-04-13 NOTE — Progress Notes (Signed)
Virtual Visit via Telephone Note  I connected with Hayley [name] on 04/13/19 at  8:40 AM EDT by telephone and verified that I am speaking with the correct person using two identifiers.   I discussed the limitations, risks, security and privacy concerns of performing an evaluation and management service by telephone and the availability of in person appointments. I also discussed with the patient that there may be a patient responsible charge related to this service. The patient expressed understanding and agreed to proceed.   History of Present Illness: Patient was evaluated through phone session.  On her last visit she was going through grief and not able to sleep well.  We recommended to try trazodone 100 mg however also reminded that if she developed tremors worsening then she should cut down to 50 mg only.  Patient has tremors and she gets Botox injection.  She was recently admitted to the hospital due to shortness of breath and feeling fatigued.  Her COVID test was negative but she was found to have DVT.  She was discharged on home oxygen and she is doing much better.  Overall she feeling better since trazodone increased dose.  She has more energy when she get full night sleep.  She also using CPAP.  She reported less depression and anxiety.  She also try to contact hospice and she was referred to group counseling for grief.  She still dream about her husband who died in December 22, 2022 and nursing home.  She admitted crying spells but they are not as frequent and intense.  She admitted some financial stress since the husband died and now she is looking for a Forensic scientist and home health care who can deliver the medication at home and also send home health aide to check on her.  Patient hoping to start group therapy through the hospice as she still have some grief about losing her husband.  She is trying to get in touch with her daughter on a regular basis.  She lives by herself.  She noticed her energy level  is improved.  She wants to continue Trintellix and trazodone.  She has mild tremors but they are not worst and manageable.  She also like to resume therapy with Almyra Free ordered once COVID situation is get under better.  Patient denies drinking or using any illegal substances.     Past Psychiatric History:Reviewed. H/Odepression and anxiety.H/Osuicidal attempt on Percocet in 12/21/17.TookEffexorin her 50sbut stopped after working for a while. Tried Zoloft, Paxil and Wellbutrin fromPCP but did not work. Did IOP,PHPand givenProzac butnoimprovement.Tried lexapro caused GI s/e.  Recent Results (from the past 2158/12/22 hour(s))  CBC with Differential     Status: None   Collection Time: 03/31/19  3:03 PM  Result Value Ref Range   WBC 5.0 4.0 - 10.5 K/uL   RBC 4.88 3.87 - 5.11 MIL/uL   Hemoglobin 13.6 12.0 - 15.0 g/dL   HCT 42.5 36.0 - 46.0 %   MCV 87.1 80.0 - 100.0 fL   MCH 27.9 26.0 - 34.0 pg   MCHC 32.0 30.0 - 36.0 g/dL   RDW 13.2 11.5 - 15.5 %   Platelets 248 150 - 400 K/uL   nRBC 0.0 0.0 - 0.2 %   Neutrophils Relative % 60 %   Neutro Abs 3.0 1.7 - 7.7 K/uL   Lymphocytes Relative 25 %   Lymphs Abs 1.3 0.7 - 4.0 K/uL   Monocytes Relative 11 %   Monocytes Absolute 0.6 0.1 - 1.0  K/uL   Eosinophils Relative 4 %   Eosinophils Absolute 0.2 0.0 - 0.5 K/uL   Basophils Relative 0 %   Basophils Absolute 0.0 0.0 - 0.1 K/uL   Immature Granulocytes 0 %   Abs Immature Granulocytes 0.01 0.00 - 0.07 K/uL    Comment: Performed at [facility] [address], [place], [place] [address]  Troponin I (High Sensitivity)     Status: None   Collection Time: 03/31/19  3:28 PM  Result Value Ref Range   Troponin I (High Sensitivity) 3 <18 ng/L    Comment: (NOTE) Elevated high sensitivity troponin I (hsTnI) values and significant  changes across serial measurements may suggest ACS but many other  chronic and acute conditions are known to elevate hsTnI results.  Refer to the "Links"  section for chest pain algorithms and additional  guidance. Performed at [facility], [place] [address] [place], [place] [address]   Brain natriuretic peptide     Status: None   Collection Time: 03/31/19  3:31 PM  Result Value Ref Range   B Natriuretic Peptide 94.9 0.0 - 100.0 pg/mL    Comment: Performed at [facility] [address], [place], [place] [address]  Urinalysis, Routine w reflex microscopic     Status: None   Collection Time: 03/31/19  3:51 PM  Result Value Ref Range   Color, Urine YELLOW YELLOW   APPearance CLEAR CLEAR   Specific Gravity, Urine 1.008 1.005 - 1.030   pH 7.0 5.0 - 8.0   Glucose, UA NEGATIVE NEGATIVE mg/dL   Hgb urine dipstick NEGATIVE NEGATIVE   Bilirubin Urine NEGATIVE NEGATIVE   Ketones, ur NEGATIVE NEGATIVE mg/dL   Protein, ur NEGATIVE NEGATIVE mg/dL   Nitrite NEGATIVE NEGATIVE   Leukocytes,Ua NEGATIVE NEGATIVE    Comment: Performed at [facility] [address] [place], [place] [address]  Comprehensive metabolic panel     Status: Abnormal   Collection Time: 03/31/19  4:11 PM  Result Value Ref Range   Sodium 141 135 - 145 mmol/L   Potassium 4.2 3.5 - 5.1 mmol/L   Chloride 104 98 - 111 mmol/L   CO2 27 22 - 32 mmol/L   Glucose, Bld 92 70 - 99 mg/dL   BUN 6 (L) 8 - 23 mg/dL   Creatinine, Ser 0.75 0.44 - 1.00 mg/dL   Calcium 8.9 8.9 - 10.3 mg/dL   Total Protein 6.9 6.5 - 8.1 g/dL   Albumin 3.7 3.5 - 5.0 g/dL   AST 15 15 - 41 U/L   ALT 8 0 - 44 U/L   Alkaline Phosphatase 54 38 - 126 U/L   Total Bilirubin 0.8 0.3 - 1.2 mg/dL   GFR calc non Af Amer >60 >60 mL/min   GFR calc Af Amer >60 >60 mL/min   Anion gap 10 5 - 15    Comment: Performed at [facility], [place] [address], [place], [place] [address]  Troponin I (High Sensitivity)     Status: None   Collection Time: 03/31/19  5:28 PM  Result Value Ref Range   Troponin I (High Sensitivity) 4 <18 ng/L    Comment: (NOTE) Elevated high sensitivity troponin I (hsTnI)  values and significant  changes across serial measurements may suggest ACS but many other  chronic and acute conditions are known to elevate hsTnI results.  Refer to the "Links" section for chest pain algorithms and additional  guidance. Performed at [facility],  [address], [place], [place] [address]   SARS Coronavirus 2 (CEPHEID - Performed in [facility] hospital lab), Hosp Order     Status: None   Collection Time: 03/31/19 10:30 PM   Specimen: Nasopharyngeal Swab  Result Value Ref Range   SARS Coronavirus 2 NEGATIVE NEGATIVE    Comment: (NOTE) If result is NEGATIVE SARS-CoV-2 target nucleic acids are NOT DETECTED. The SARS-CoV-2 RNA is generally detectable in upper and lower  respiratory specimens during the acute phase of infection. The lowest  concentration of SARS-CoV-2 viral copies this assay can detect is 250  copies / mL. A negative result does not preclude SARS-CoV-2 infection  and should not be used as the sole basis for treatment or other  patient management decisions.  A negative result may occur with  improper specimen collection / handling, submission of specimen other  than nasopharyngeal swab, presence of viral mutation(s) within the  areas targeted by this assay, and inadequate number of viral copies  (<250 copies / mL). A negative result must be combined with clinical  observations, patient history, and epidemiological information. If result is POSITIVE SARS-CoV-2 target nucleic acids are DETECTED. The SARS-CoV-2 RNA is generally detectable in upper and lower  respiratory specimens dur ing the acute phase of infection.  Positive  results are indicative of active infection with SARS-CoV-2.  Clinical  correlation with patient history and other diagnostic information is  necessary to determine patient infection status.  Positive results do  not rule out bacterial infection or co-infection with other viruses. If result is PRESUMPTIVE POSTIVE SARS-CoV-2  nucleic acids MAY BE PRESENT.   A presumptive positive result was obtained on the submitted specimen  and confirmed on repeat testing.  While 2019 novel coronavirus  (SARS-CoV-2) nucleic acids may be present in the submitted sample  additional confirmatory testing may be necessary for epidemiological  and / or clinical management purposes  to differentiate between  SARS-CoV-2 and other Sarbecovirus currently known to infect humans.  If clinically indicated additional testing with an alternate test  methodology [id number]) is advised. The SARS-CoV-2 RNA is generally  detectable in upper and lower respiratory sp ecimens during the acute  phase of infection. The expected result is Negative. Fact Sheet for Patients:  StrictlyIdeas.no Fact Sheet for Healthcare Providers: BankingDealers.co.za This test is not yet approved or cleared by the [place] FDA and has been authorized for detection and/or diagnosis of SARS-CoV-2 by FDA under an Emergency Use Authorization (EUA).  This EUA will remain in effect (meaning this test can be used) for the duration of the COVID-19 declaration under Section 564(b)(1) of the Act, 21 U.S.C. section 360bbb-3(b)(1), unless the authorization is terminated or revoked sooner. Performed at [facility], [place] [address] [place], [place] [address]   Heparin level (unfractionated)     Status: Abnormal   Collection Time: 03/31/19 10:30 PM  Result Value Ref Range   Heparin Unfractionated <0.10 (L) 0.30 - 0.70 IU/mL    Comment: (NOTE) If heparin results are below expected values, and patient dosage has  been confirmed, suggest follow up testing of antithrombin III levels. Performed at [facility], [place] [address] [place], [place] [address]   APTT     Status: None   Collection Time: 03/31/19 10:30 PM  Result Value Ref Range   aPTT 32 24 - 36 seconds    Comment: Performed at [facility] [address], [place], [place] [address]  Type and screen  [place]     Status: None   Collection Time: 04/01/19 12:30 AM  Result Value Ref Range   ABO/RH(D) O POS    Antibody Screen NEG    Sample Expiration      04/04/2019,2359 Performed at [facility], [place] [address], [place], [place] [address]   ABO/Rh     Status: None   Collection Time: 04/01/19 12:30 AM  Result Value Ref Range   ABO/RH(D)      O POS Performed at [facility] [address] [place], [place] [address]   Glucose, capillary     Status: None   Collection Time: 04/01/19  2:25 AM  Result Value Ref Range   Glucose-Capillary 95 70 - 99 mg/dL  Heparin level (unfractionated)     Status: None   Collection Time: 04/01/19  4:07 AM  Result Value Ref Range   Heparin Unfractionated 0.66 0.30 - 0.70 IU/mL    Comment: (NOTE) If heparin results are below expected values, and patient dosage has  been confirmed, suggest follow up testing of antithrombin III levels. Performed at [facility], [place] [address], [place], [place] [address]   Basic metabolic panel     Status: Abnormal   Collection Time: 04/01/19  4:07 AM  Result Value Ref Range   Sodium 139 135 - 145 mmol/L   Potassium 3.6 3.5 - 5.1 mmol/L   Chloride 102 98 - 111 mmol/L   CO2 28 22 - 32 mmol/L   Glucose, Bld 180 (H) 70 - 99 mg/dL   BUN 7 (L) 8 - 23 mg/dL   Creatinine, Ser 0.84 0.44 - 1.00 mg/dL   Calcium 8.7 (L) 8.9 - 10.3 mg/dL   GFR calc non Af Amer >60 >60 mL/min   GFR calc Af Amer >60 >60 mL/min   Anion gap 9 5 - 15    Comment: Performed at [facility], [place] [address], [place], [place] [address]  CBC WITH DIFFERENTIAL     Status: None   Collection Time: 04/01/19  4:07 AM  Result Value Ref Range   WBC 4.7 4.0 - 10.5 K/uL   RBC 4.65 3.87 - 5.11 MIL/uL   Hemoglobin 12.9 12.0 - 15.0 g/dL   HCT 41.6 36.0 - 46.0 %   MCV 89.5 80.0 - 100.0 fL   MCH 27.7 26.0 - 34.0 pg   MCHC 31.0 30.0 - 36.0 g/dL   RDW 13.2 11.5 -  15.5 %   Platelets 228 150 - 400 K/uL   nRBC 0.0 0.0 - 0.2 %   Neutrophils Relative % 50 %   Neutro Abs 2.4 1.7 - 7.7 K/uL   Lymphocytes Relative 34 %   Lymphs Abs 1.6 0.7 - 4.0 K/uL   Monocytes Relative 10 %   Monocytes Absolute 0.5 0.1 - 1.0 K/uL   Eosinophils Relative 5 %   Eosinophils Absolute 0.2 0.0 - 0.5 K/uL   Basophils Relative 1 %   Basophils Absolute 0.0 0.0 - 0.1 K/uL   Immature Granulocytes 0 %   Abs Immature Granulocytes 0.01 0.00 - 0.07 K/uL    Comment: Performed at [facility], [address] [place], [place] [address]  Glucose, capillary     Status: Abnormal   Collection Time: 04/01/19  6:00 AM  Result Value Ref Range   Glucose-Capillary 140 (H) 70 - 99 mg/dL  Heparin level (unfractionated)     Status: Abnormal   Collection Time: 04/01/19  10:44 AM  Result Value Ref Range   Heparin Unfractionated 0.71 (H) 0.30 - 0.70 IU/mL    Comment: (NOTE) If heparin results are below expected values, and patient dosage has  been confirmed, suggest follow up testing of antithrombin III levels. Performed at [facility], [place] [address] [place], [place] [address]   ECHOCARDIOGRAM COMPLETE     Status: None   Collection Time: 04/01/19 12:57 PM  Result Value Ref Range   Weight 3,246.4 oz   Height 66 in   BP 105/78 mmHg  Glucose, capillary     Status: Abnormal   Collection Time: 04/01/19  1:15 PM  Result Value Ref Range   Glucose-Capillary 125 (H) 70 - 99 mg/dL   Comment 1 Notify RN    Comment 2 Document in Chart   Glucose, capillary     Status: Abnormal   Collection Time: 04/01/19  5:04 PM  Result Value Ref Range   Glucose-Capillary 133 (H) 70 - 99 mg/dL  Glucose, capillary     Status: Abnormal   Collection Time: 04/01/19  9:52 PM  Result Value Ref Range   Glucose-Capillary 133 (H) 70 - 99 mg/dL   Comment 1 Notify RN    Comment 2 Document in Chart   CBC     Status: None   Collection Time: 04/02/19  5:00 AM  Result Value Ref Range   WBC 4.7 4.0 - 10.5  K/uL   RBC 4.60 3.87 - 5.11 MIL/uL   Hemoglobin 12.6 12.0 - 15.0 g/dL   HCT 41.0 36.0 - 46.0 %   MCV 89.1 80.0 - 100.0 fL   MCH 27.4 26.0 - 34.0 pg   MCHC 30.7 30.0 - 36.0 g/dL   RDW 13.2 11.5 - 15.5 %   Platelets 211 150 - 400 K/uL   nRBC 0.0 0.0 - 0.2 %    Comment: Performed at [facility], [place] [address] [place], [place] [address]  Hemoglobin A1c     Status: Abnormal   Collection Time: 04/02/19  5:00 AM  Result Value Ref Range   Hgb A1c MFr Bld 6.4 (H) 4.8 - 5.6 %    Comment: (NOTE) Pre diabetes:          5.7%-6.4% Diabetes:              >6.4% Glycemic control for   <7.0% adults with diabetes    Mean Plasma Glucose 136.98 mg/dL    Comment: Performed at [facility] [address], [place],  [address]  Glucose, capillary     Status: Abnormal   Collection Time: 04/02/19  6:04 AM  Result Value Ref Range   Glucose-Capillary 105 (H) 70 - 99 mg/dL   Comment 1 Notify RN    Comment 2 Document in Chart   Glucose, capillary     Status: Abnormal   Collection Time: 04/02/19 11:19 AM  Result Value Ref Range   Glucose-Capillary 110 (H) 70 - 99 mg/dL  Glucose, capillary     Status: Abnormal   Collection Time: 04/02/19  3:48 PM  Result Value Ref Range   Glucose-Capillary 165 (H) 70 - 99 mg/dL  Glucose, capillary     Status: Abnormal   Collection Time: 04/02/19  9:19 PM  Result Value Ref Range   Glucose-Capillary 146 (H) 70 - 99 mg/dL   Comment 1 Notify RN    Comment 2 Document in Chart   Glucose, capillary     Status: Abnormal  Collection Time: 04/03/19  5:57 AM  Result Value Ref Range   Glucose-Capillary 106 (H) 70 - 99 mg/dL  Glucose, capillary     Status: None   Collection Time: 04/03/19 10:57 AM  Result Value Ref Range   Glucose-Capillary 87 70 - 99 mg/dL  Glucose, capillary     Status: Abnormal   Collection Time: 04/03/19  4:03 PM  Result Value Ref Range   Glucose-Capillary 122 (H) 70 - 99 mg/dL   Comment 1 Notify RN    Comment 2 Document in  Chart   Glucose, capillary     Status: Abnormal   Collection Time: 04/03/19  8:59 PM  Result Value Ref Range   Glucose-Capillary 113 (H) 70 - 99 mg/dL  Creatinine, serum     Status: None   Collection Time: 04/04/19  3:59 AM  Result Value Ref Range   Creatinine, Ser 0.71 0.44 - 1.00 mg/dL   GFR calc non Af Amer >60 >60 mL/min   GFR calc Af Amer >60 >60 mL/min    Comment: Performed at [facility], [place] [address] [place], [place] [address]  Glucose, capillary     Status: None   Collection Time: 04/04/19  6:09 AM  Result Value Ref Range   Glucose-Capillary 98 70 - 99 mg/dL  Glucose, capillary     Status: Abnormal   Collection Time: 04/04/19 11:19 AM  Result Value Ref Range   Glucose-Capillary 104 (H) 70 - 99 mg/dL  Glucose, capillary     Status: Abnormal   Collection Time: 04/04/19  4:55 PM  Result Value Ref Range   Glucose-Capillary 147 (H) 70 - 99 mg/dL  Glucose, capillary     Status: Abnormal   Collection Time: 04/04/19  9:47 PM  Result Value Ref Range   Glucose-Capillary 136 (H) 70 - 99 mg/dL  Glucose, capillary     Status: Abnormal   Collection Time: 04/05/19  6:13 AM  Result Value Ref Range   Glucose-Capillary 117 (H) 70 - 99 mg/dL  Glucose, capillary     Status: Abnormal   Collection Time: 04/05/19 11:13 AM  Result Value Ref Range   Glucose-Capillary 128 (H) 70 - 99 mg/dL  Glucose, capillary     Status: Abnormal   Collection Time: 04/05/19  4:08 PM  Result Value Ref Range   Glucose-Capillary 160 (H) 70 - 99 mg/dL     Psychiatric Specialty Exam: Physical Exam  ROS  Last menstrual period 09/18/1979.There is no height or weight on file to calculate BMI.  General Appearance: NA  Eye Contact:  NA  Speech:  Clear and Coherent and Slow  Volume:  Decreased  Mood:  Dysphoric  Affect:  NA  Thought Process:  Goal Directed  Orientation:  Full (Time, Place, and Person)  Thought Content:  Rumination  Suicidal Thoughts:  No  Homicidal Thoughts:  No  Memory:   Immediate;   Fair Recent;   Fair Remote;   Fair  Judgement:  Good  Insight:  Good  Psychomotor Activity:  NA  Concentration:  Concentration: Fair and Attention Span: Fair  Recall:  Good  Fund of Knowledge:  Good  Language:  Good  Akathisia:  No  Handed:  Right  AIMS (if indicated):     Assets:  Communication Skills Desire for Improvement Housing  ADL's:  Intact  Cognition:  WNL  Sleep:   better       Assessment and Plan: Major depressive disorder, recurrent.  Generalized anxiety disorder.  Grief.  I reviewed her blood work results from recent hospitalization.  She is discharged on home oxygen and she is doing much better.  She is sleeping better.  We discussed about grief counseling and she agreed to consider group therapy through hospice for grief.  She is feeling better and sleep better since the trazodone adjusted.  She is taking 100 mg every night.  She has not taken Klonopin in recent weeks.  She wants to continue Trintellix 20 mg daily.  She has no dizziness.  I discussed medication side effects and benefits.  Recommend to see if [facility] is doing virtual therapy so she can resume her counseling.  She agreed with the plan.  She gets Klonopin from neurology.  I recommend to call us back if she is any question or any concern.  Follow-up in 3 months.  Follow Up Instructions:    I discussed the assessment and treatment plan with the patient. The patient was provided an opportunity to ask questions and all were answered. The patient agreed with the plan and demonstrated an understanding of the instructions.   The patient was advised to call back or seek an in-person evaluation if the symptoms worsen or if the condition fails to improve as anticipated.  I provided 30 minutes of non-face-to-face time during this encounter.   [name], MD

## 2019-04-14 ENCOUNTER — Other Ambulatory Visit: Payer: Self-pay | Admitting: Pharmacist

## 2019-04-14 ENCOUNTER — Other Ambulatory Visit: Payer: Self-pay

## 2019-04-14 DIAGNOSIS — E662 Morbid (severe) obesity with alveolar hypoventilation: Secondary | ICD-10-CM | POA: Diagnosis not present

## 2019-04-14 DIAGNOSIS — F322 Major depressive disorder, single episode, severe without psychotic features: Secondary | ICD-10-CM | POA: Diagnosis not present

## 2019-04-14 DIAGNOSIS — J45909 Unspecified asthma, uncomplicated: Secondary | ICD-10-CM | POA: Diagnosis not present

## 2019-04-14 DIAGNOSIS — E039 Hypothyroidism, unspecified: Secondary | ICD-10-CM | POA: Diagnosis not present

## 2019-04-14 DIAGNOSIS — E1141 Type 2 diabetes mellitus with diabetic mononeuropathy: Secondary | ICD-10-CM | POA: Diagnosis not present

## 2019-04-14 DIAGNOSIS — J9601 Acute respiratory failure with hypoxia: Secondary | ICD-10-CM | POA: Diagnosis not present

## 2019-04-14 NOTE — Patient Outreach (Addendum)
[place] [facility]) Care Management  04/14/2019  Hayley [name] 20-Apr-1946 [id number]   Unsuccessful follow up outreach to patient today.  Left voicemail message and mailed unsuccessful outreach letter.  Will attempt to reach again within four business days.    Addendum: BSW received return call from patient.  Patient confirmed receipt of meals delivered through [place] inquired about update on housing situation since our last discussion (please see note from 04/08/19 for more information). Patient has received statement from collection agency and she owes over $12,000 of past due mortgage payments.  The following was discussed. Legal Aid: patient reports contacting them last year and the options were to sale the home and take loss, foreclosure, or file bankruptcy.  Patient did state that she intends to call again since there have been some "extenuating circumstances" since then.   Patient plans to call [employer] today. She has been in communication with [place] about pension. She intends to [employer] about potential options.  Unfortunately, BSW is unable to offer additional options/resources for this particular need.  She was encouraged to contact [place] as planned.  BSW closing case.    Ronn Melena, BSW Social Worker [phone]

## 2019-04-14 NOTE — Patient Outreach (Signed)
[place] [facility]) Care Management  [place]   04/14/2019  Hayley [name] [birth year] [id number]  Reason for referral: Medication Assistance, Medication Review  Referral source: [facility] RN Current insurance: Unknown  PMHx includes but not limited to:  T2DM, anxiety / depression, hx SI, HTN, asthma, GERD, diverticulosis, hx recurrent DVT /PEs on chronic anticoagulation, hypothyroidism, hx CVA, migraines, OSA non-compliant with CPAP.  Recent hospitalization for acute hypoxic respiratory failure likely 2/2 restrictive lung disease due to chronic basilar scarring and OSA / obesity hypoventilation syndrome.    Outreach:  Unsuccessful telephone call attempt #1 to patient. Unable to leave message - Voicemail full.   Plan:  -Noted that another [facility] discipline has recently mailed patient an unsuccessful outreach letter.  -I will make another outreach attempt to patient within 3-4 business days.    Ralene Bathe, PharmD, [place] [phone]

## 2019-04-15 DIAGNOSIS — R05 Cough: Secondary | ICD-10-CM | POA: Diagnosis not present

## 2019-04-16 ENCOUNTER — Ambulatory Visit: Payer: Medicare Other

## 2019-04-18 DIAGNOSIS — E1141 Type 2 diabetes mellitus with diabetic mononeuropathy: Secondary | ICD-10-CM | POA: Diagnosis not present

## 2019-04-18 DIAGNOSIS — J45909 Unspecified asthma, uncomplicated: Secondary | ICD-10-CM | POA: Diagnosis not present

## 2019-04-18 DIAGNOSIS — J9601 Acute respiratory failure with hypoxia: Secondary | ICD-10-CM | POA: Diagnosis not present

## 2019-04-18 DIAGNOSIS — E662 Morbid (severe) obesity with alveolar hypoventilation: Secondary | ICD-10-CM | POA: Diagnosis not present

## 2019-04-18 DIAGNOSIS — F322 Major depressive disorder, single episode, severe without psychotic features: Secondary | ICD-10-CM | POA: Diagnosis not present

## 2019-04-18 DIAGNOSIS — E039 Hypothyroidism, unspecified: Secondary | ICD-10-CM | POA: Diagnosis not present

## 2019-04-20 ENCOUNTER — Other Ambulatory Visit: Payer: Self-pay | Admitting: Family Medicine

## 2019-04-20 DIAGNOSIS — N63 Unspecified lump in unspecified breast: Secondary | ICD-10-CM

## 2019-04-21 DIAGNOSIS — F322 Major depressive disorder, single episode, severe without psychotic features: Secondary | ICD-10-CM | POA: Diagnosis not present

## 2019-04-21 DIAGNOSIS — E039 Hypothyroidism, unspecified: Secondary | ICD-10-CM | POA: Diagnosis not present

## 2019-04-21 DIAGNOSIS — E1141 Type 2 diabetes mellitus with diabetic mononeuropathy: Secondary | ICD-10-CM | POA: Diagnosis not present

## 2019-04-21 DIAGNOSIS — J9601 Acute respiratory failure with hypoxia: Secondary | ICD-10-CM | POA: Diagnosis not present

## 2019-04-21 DIAGNOSIS — J45909 Unspecified asthma, uncomplicated: Secondary | ICD-10-CM | POA: Diagnosis not present

## 2019-04-21 DIAGNOSIS — E662 Morbid (severe) obesity with alveolar hypoventilation: Secondary | ICD-10-CM | POA: Diagnosis not present

## 2019-04-22 ENCOUNTER — Ambulatory Visit: Payer: Self-pay | Admitting: Pharmacist

## 2019-04-22 ENCOUNTER — Other Ambulatory Visit: Payer: Self-pay | Admitting: Pharmacist

## 2019-04-22 DIAGNOSIS — J45909 Unspecified asthma, uncomplicated: Secondary | ICD-10-CM | POA: Diagnosis not present

## 2019-04-22 DIAGNOSIS — F322 Major depressive disorder, single episode, severe without psychotic features: Secondary | ICD-10-CM | POA: Diagnosis not present

## 2019-04-22 DIAGNOSIS — E662 Morbid (severe) obesity with alveolar hypoventilation: Secondary | ICD-10-CM | POA: Diagnosis not present

## 2019-04-22 DIAGNOSIS — J9601 Acute respiratory failure with hypoxia: Secondary | ICD-10-CM | POA: Diagnosis not present

## 2019-04-22 DIAGNOSIS — E039 Hypothyroidism, unspecified: Secondary | ICD-10-CM | POA: Diagnosis not present

## 2019-04-22 DIAGNOSIS — E1141 Type 2 diabetes mellitus with diabetic mononeuropathy: Secondary | ICD-10-CM | POA: Diagnosis not present

## 2019-04-22 NOTE — Patient Outreach (Signed)
[place] [facility]) Care Management  [place]   04/22/2019  Hayley [name] 1946/07/05 [id number]  Reason for referral: Medication Assistance, Medication Review  Referral source: [facility] RN Current insurance: Unknown  PMHx includes but not limited to:  T2DM, anxiety / depression, hx SI, HTN, asthma, GERD, diverticulosis, hx recurrent DVT /PEs on chronic anticoagulation, hypothyroidism, hx CVA, migraines, OSA non-compliant with CPAP.  Recent hospitalization for acute hypoxic respiratory failure likely 2/2 restrictive lung disease due to chronic basilar scarring and OSA / obesity hypoventilation syndrome.    Outreach:  Unsuccessful telephone call attempt #2 to patient. HIPAA compliant voicemail left requesting a return call  Plan:  -I will make another outreach attempt to patient within 3-4 business days.   Ralene Bathe, PharmD, [place] [phone]

## 2019-04-23 ENCOUNTER — Other Ambulatory Visit: Payer: Medicare Other

## 2019-04-28 ENCOUNTER — Other Ambulatory Visit: Payer: Self-pay

## 2019-04-28 ENCOUNTER — Ambulatory Visit: Payer: Self-pay | Admitting: Pharmacist

## 2019-04-28 ENCOUNTER — Other Ambulatory Visit: Payer: Self-pay | Admitting: Pharmacist

## 2019-04-28 DIAGNOSIS — M5416 Radiculopathy, lumbar region: Secondary | ICD-10-CM | POA: Diagnosis not present

## 2019-04-28 NOTE — Patient Outreach (Signed)
[place] [facility]) Care Management  04/28/2019  LUCILLA PETRENKO 10/22/1945 [id number]   Unsuccessful Outreach Attempt.  Called member at preferred number to complete initial assessment and update care plan; however, no answer. HIPPA compliant voice mail message left introducing self and requested return call when available.   Will call member within 3-4 business days.  Benjamine Mola "ANN" [name], RN-BSN  [facility] Care Management  Community Care Management Coordinator  [phone] [email or web address]

## 2019-04-28 NOTE — Patient Outreach (Signed)
[place] [facility]) Care Management  [place]  04/28/2019  Hayley [name] 02-Oct-1945 [id number]   Reason for referral:Medication Assistance, Medication Review  Referral source:[facility] RN Current insurance:Unknown  PMHx includes but not limited to:T2DM, anxiety / depression, hx SI, HTN, asthma, GERD, diverticulosis, hx recurrent DVT /PEs on chronic anticoagulation, hypothyroidism, hx CVA, migraines, OSA non-compliant with CPAP. Recent hospitalization for acute hypoxic respiratory failure likely 2/2 restrictive lung disease due to chronic basilar scarring and OSA / obesity hypoventilation syndrome.   Outreach:  Unsuccessful telephone call attempt #3 to patient.   HIPAA compliant voicemail left requesting a return call  Plan:  -I will close [name] case at this time as I have been unable to establish and/or maintain contact with patient.  -I am happy to assist in the future as needed.    Ralene Bathe, PharmD, [place] [phone]

## 2019-04-30 ENCOUNTER — Telehealth: Payer: Self-pay | Admitting: Neurology

## 2019-04-30 DIAGNOSIS — E039 Hypothyroidism, unspecified: Secondary | ICD-10-CM | POA: Diagnosis not present

## 2019-04-30 DIAGNOSIS — F322 Major depressive disorder, single episode, severe without psychotic features: Secondary | ICD-10-CM | POA: Diagnosis not present

## 2019-04-30 DIAGNOSIS — E662 Morbid (severe) obesity with alveolar hypoventilation: Secondary | ICD-10-CM | POA: Diagnosis not present

## 2019-04-30 DIAGNOSIS — E1141 Type 2 diabetes mellitus with diabetic mononeuropathy: Secondary | ICD-10-CM | POA: Diagnosis not present

## 2019-04-30 DIAGNOSIS — J45909 Unspecified asthma, uncomplicated: Secondary | ICD-10-CM | POA: Diagnosis not present

## 2019-04-30 DIAGNOSIS — J9601 Acute respiratory failure with hypoxia: Secondary | ICD-10-CM | POA: Diagnosis not present

## 2019-04-30 NOTE — Telephone Encounter (Signed)
Hayley [name] missed her appt for Botox on 02/11/2019.  Hayley [name] said Hayley [name] has recently been in the hospital and would like to start with an office visit with Dr. [name] to discuss headaches and history of CVA.  Hayley [name] wants to talk about if Hayley [name] should continue Botox.  Office visit scheduled for her.

## 2019-04-30 NOTE — Telephone Encounter (Signed)
Pt is wanting to r/s her BOTOX appt that was canceled due to the COVID-19. Please advise.

## 2019-05-01 ENCOUNTER — Other Ambulatory Visit: Payer: Self-pay

## 2019-05-01 NOTE — Addendum Note (Signed)
Addended by: Suzie Portela A on: 05/01/2019 04:11 PM   Modules accepted: Orders

## 2019-05-01 NOTE — Patient Outreach (Addendum)
[facility]) Care Management  05/01/2019  Hayley [name] Oct 28, 1945 [id number]   Called member at preferred number to complete initial assessment and member answered phone. Introduced self and explained reason for the call. HIPPA identifiers verified.   Member confirmed that she received [facility] Welcome packet and has placed her Nurse Advice Line magnet on refrigerator; however, has not signed consent yet. Member states she will mail consents.   Hypoxia: Member states she is doing well and states she is able to mow her own grass, clean her own house, and drive her vehicle. Member continues with oxygen 2-3 liters at bedtime and states last O2 sat 94%. Member states next Pulmonologist appointment scheduled in October 2020. Member denies SOB or dyspnea at this time.   Depression: Member states she has history of Depression and states she receives grief counseling due to her husband deceased Dec 15, 2018. Member takes antidepressants and follows up with Dr. Berniece Andreas, [facility]. Member states she is sad due to loss of her spouse; however, she "feels good" and denies thoughts of harming herself. Member denies any further need for depression.  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   Neurology: Member states she follows up with Dr. [name] for migraines and has an appointment scheduled for 15-Dec-2022 05-04-2019.   [place]: Data processing manager, PT, OT visits continue at this time and member states compliance with treatments. Member states pan to join the "Y" to learn to swim via silver sneakers. Member states she participates in walking exercises daily.  Dentist: member states she is in need for new dentist that accepts Medicaid to care for 4 teeth. Member made aware to call Medicaid for list of Dentist.  Member made aware that [facility] Pharmacist called to provide assistance and member states she will return call to Pharmacist.   Meals:  member states she receives low carb meals via [employer].  Social: Member states she participates with church functions virtually. Member shared memories of her spouse and member laughed many times during assessment and displayed happiness. RN CM provided listening and engaged in conversation with member.  Member made aware that RN CM will transition to new position and will transition member's continued care to  Other [place] to follow care for next month and member verbalized understanding and agreement. Member states appreciation for care received from [facility] staff.  Will send referral for [facility] Manager to follow up for complex management of care. Will make Ralene Bathe, [facility] RPH aware of case closure.    Medications Reviewed Today    Reviewed by Brandy Hale, RN (Registered Nurse) on 05/01/19 at 1124  Med List Status: <None>  Medication Order Taking? Sig Documenting Provider Last Dose Status Informant  albuterol (PROVENTIL HFA;VENTOLIN HFA) 108 (90 BASE) MCG/ACT inhaler [id number]  Inhale 1 puff into the lungs every 6 (six) hours as needed for wheezing or shortness of breath. [provider]  Active Self  albuterol (PROVENTIL) (2.5 MG/3ML) 0.083% nebulizer solution [id number]  Take 2.5 mg by nebulization every 6 (six) hours as needed for wheezing or shortness of breath. Reported on 01/10/2016 [provider]  Active Self  apixaban (ELIQUIS) 5 MG TABS tablet [id number]  Take 1 tablet (5 mg total) by mouth 2 (two) times daily. Guilford Shi, MD  Active   butalbital-acetaminophen-caffeine (FIORICET) 50-325-40 MG tablet [id number]  Take 1 tablet by mouth every 6 (six) hours as needed for headache or  migraine.  Patient not taking: Reported on 04/06/2019   Guilford Shi, MD  Active   clonazePAM (KLONOPIN) 0.5 MG tablet [id number]  Take 1 tablet (0.5 mg total) by mouth at bedtime as needed for anxiety.  Patient not taking: Reported on 04/06/2019   Marcial Pacas, MD   Active Self  cyanocobalamin (,VITAMIN B-12,) 1000 MCG/ML injection [id number]  Inject 1 mL into the skin every 30 (thirty) days. [provider]  Active Self           Med Note Nash Mantis, TIFFANI S   Wed Apr 01, 2019 11:54 AM)    diclofenac (FLECTOR) 1.3 % PTCH [id number]  Place 1 patch onto the skin daily. Guilford Shi, MD  Active   dicyclomine (BENTYL) 20 MG tablet [id number]  Take 20 mg by mouth every 6 (six) hours as needed for pain. [provider]  Active Self  hydrOXYzine (ATARAX/VISTARIL) 25 MG tablet [id number]  Take 1 tablet (25 mg total) by mouth 3 (three) times daily as needed for anxiety, itching or nausea. Guilford Shi, MD  Active   hyoscyamine (ANASPAZ) 0.125 MG TBDP disintergrating tablet [id number]  Place 0.125 mg under the tongue every 4 (four) hours as needed for bladder spasms or cramping.  [provider]  Active Self  levothyroxine (SYNTHROID, LEVOTHROID) 25 MCG tablet [id number]  Take 25 mcg by mouth every morning.  [provider]  Active Self  metoCLOPramide (REGLAN) 5 MG tablet [id number]  Take 1 tablet (5 mg total) by mouth 3 (three) times daily with meals.  Patient not taking: Reported on 04/06/2019   Guilford Shi, MD  Active   mometasone-formoterol [facility]) 100-5 MCG/ACT Hollie Salk [id number]  Inhale 2 puffs into the lungs 2 (two) times daily as needed for wheezing or shortness of breath.  [provider]  Active Self           Med Note Nash Mantis, TIFFANI S   Wed Apr 01, 2019 11:55 AM)    MYRBETRIQ 50 MG TB24 tablet [id number]  Take 50 mg by mouth at bedtime.  [provider]  Active Self  OnabotulinumtoxinA (BOTOX IM) [id number]  Inject 200 Units into the muscle every 3 (three) months. [provider]  Active Self  ondansetron (ZOFRAN-ODT) 8 MG disintegrating tablet [id number]  Take 8 mg by mouth daily as needed for nausea.  [provider]  Active Self  propranolol (INDERAL) 60 MG tablet [id number]  Take  60 mg by mouth 2 (two) times daily. [name], MD  Active Self  simethicone [facility]) 80 MG chewable tablet [id number]  Chew 1 tablet (80 mg total) by mouth 4 (four) times daily as needed for flatulence (belching). Guilford Shi, MD  Active   sitaGLIPtin (JANUVIA) 25 MG tablet [id number]  Take 1 tablet (25 mg total) by mouth daily. Guilford Shi, MD  Active   traZODone (DESYREL) 100 MG tablet [id number]  Take 1/2 to one tab at bed time as needed Arfeen, [name], MD  Active   valACYclovir (VALTREX) 1000 MG tablet [id number]  Take 2,000 mg by mouth 2 (two) times daily as needed (Fever blisters). Reported on 01/10/2016 [provider]  Active Self           Med Note Pollie Meyer NICKS, [facility] C   Tue Aug 16, 2015  9:02 PM)    vortioxetine HBr (TRINTELLIX) 20 MG TABS tablet [id number]  Take 1 tablet (20 mg total) by mouth daily. [name], MD  Active  Depression screen PHQ 2/9 04/06/2019  Decreased Interest 1  Down, Depressed, Hopeless 2  PHQ - 2 Score 3  Altered sleeping 1  Tired, decreased energy 1  Change in appetite 1  Feeling bad or failure about yourself  0  Trouble concentrating 1  Moving slowly or fidgety/restless 0  Suicidal thoughts 0  PHQ-9 Score 7  Difficult doing work/chores Somewhat difficult  Some recent data might be hidden   Fall Risk  04/06/2019  Falls in the past year? 0  Number falls in past yr: 0  Injury with Fall? 0    Ethin Drummond "ANN" [name], [place] Care Management  Community Care Management Coordinator  [phone] [email or web address]

## 2019-05-04 ENCOUNTER — Ambulatory Visit (INDEPENDENT_AMBULATORY_CARE_PROVIDER_SITE_OTHER): Payer: Medicare Other | Admitting: Neurology

## 2019-05-04 ENCOUNTER — Other Ambulatory Visit: Payer: Self-pay

## 2019-05-04 VITALS — BP 101/62 | HR 61 | Temp 98.5°F | Ht 66.0 in | Wt 206.5 lb

## 2019-05-04 DIAGNOSIS — G43709 Chronic migraine without aura, not intractable, without status migrainosus: Secondary | ICD-10-CM

## 2019-05-04 DIAGNOSIS — R531 Weakness: Secondary | ICD-10-CM

## 2019-05-04 DIAGNOSIS — M6281 Muscle weakness (generalized): Secondary | ICD-10-CM | POA: Diagnosis not present

## 2019-05-04 DIAGNOSIS — G250 Essential tremor: Secondary | ICD-10-CM

## 2019-05-04 DIAGNOSIS — IMO0002 Reserved for concepts with insufficient information to code with codable children: Secondary | ICD-10-CM

## 2019-05-04 NOTE — Patient Instructions (Addendum)
Myrbetric  Common  Cardiovascular: Hypertension (7.5% to 11.3% ), Tachycardia (1.2% to 2.2% )  Gastrointestinal: Constipation (1.2% to 4.2% ), Xerostomia (2.8% to 9.3% )  Neurologic: Headache (1.6% to 4.1% )  Renal: Urinary tract infectious disease (2.9% to 8.4% )  Respiratory: Nasopharyngitis (3.5% to 3.9% ) Serious  Neurologic: Cerebrovascular accident (0.4% )  Renal: Urinary retention  Other: Cancer (0.1% to 1.3% )   BOTOX Injection as migraine prevention

## 2019-05-04 NOTE — Patient Outreach (Signed)
[place] [facility]) Care Management  05/04/2019  JIL PENLAND 1946/01/12 [id number]  Late entry: added medication list, fall screen and depression screen to notes 05-01-2019. Updated history tab as history was reviewed with member on 05-01-2019.  Benjamine Mola "ANN" [name], RN-BSN  [facility] Care Management  Community Care Management Coordinator  [phone] [email or web address]

## 2019-05-04 NOTE — Progress Notes (Signed)
Chief Complaint  Patient presents with   Tremors    Reports worsening tremors in her hands and head.  She has new onset of cervical and bilateral shoulder pain.    Numbness    She is concerned about her left big toe being numb.   Gait Problem    She has had PT come to her home twice after her recent hospitalization.   Headache    She wanted to be physically evaluated today before moving forward with further Botox injections.       PATIENT: Hayley [name] DOB: 06/27/1946  Chief Complaint  Patient presents with   Tremors    Reports worsening tremors in her hands and head.  She has new onset of cervical and bilateral shoulder pain.    Numbness    She is concerned about her left big toe being numb.   Gait Problem    She has had PT come to her home twice after her recent hospitalization.   Headache    She wanted to be physically evaluated today before moving forward with further Botox injections.      HISTORICAL  Hayley [name] is a 73 years old right-handed female, seen in refer by hand surgeon Dr. Daryll Brod, and her primary care PA Flora Lipps for evaluation of neck pain, radiating pain to bilateral shoulder, upper extremity  She has past medical history of spasmodic dysphonia, status post surgical correction, also had a history of hypertension, diabetes, DVT, PE following surgical procedure, most recent was right knee replacement in March 2016, is taking anticoagulation pradaxa, chronic low back pain, previous history of epidural injection for low back pain.  She fell from law mower in June 2016, landed on her back, she was able to got up, finished mowing her lawn, later that day, she noticed worsening low back pain, significant neck pain, radiating pain to her bilateral shoulder, upper extremity, hands, she also noticed bilateral hands paresthesia, burning sensation, subjective weakness, she had worsening urinary urgency, wear pads occasionally, mild unbalanced gait, mild  constipation  She was evaluated by [facility] orthopedic surgeon Dr. Rolena Infante for her worsening low back pain, reported MRI of lumbar spine at [place] in July 2016, was told she is not a surgical candidate, she does has multilevel lumbar degenerative disc disease.  She is also concerned about her bilateral hands tremor, getting worse since 2015, most noticeable when she writes, holding utensils, there was no similar family history  UPDATE August 24th 2016: She continues to complain bilateral neck pain, radiating pain to bilateral shoulder, arm, We have reviewed MRI cervical spine (without) demonstrating: 1. At C3-4: disc bulging and uncovertebral joint hypertrophy and facet hypertrophy with severe left foraminal stenosis 2. At C4-5: disc bulging and uncovertebral joint hypertrophy with severe biforaminal stenosis 3. At C5-6: disc bulging and uncovertebral joint hypertrophy with moderate right and severe left foraminal stenosis 4. At C6-7: disc bulging and uncovertebral joint hypertrophy with moderate biforaminal stenosis 5. No intrinsic or compressive spinal cord lesions  Propanolol 40 mg twice a day has helped her tremor, she denies significant side effect.  UPDATE June 26th 2017: She continue complains of bilateral lower extremity posturing tremor, significant neck pain, radiating pain to bilateral upper extremity, increased unsteady gait, she has fell few times, worsening bilateral lower extremity paresthesia  UPDATE Sept 1 2017: She has worsening low back pain, it gets worse with any movement, difficulty changing bed linen, she is no longer cooking,   She recently had  spinal stimulator trial by [facility] by Dr. Delfino Lovett Rauck,,was deemed to be a potential candidate, she did responding well to the spinal stimulator. She complains of midline constant low back pain, radiating down to right hip, right leg and left leg, getting worse with movement, she has bladder urgency,  no bowel incontinence.   She had physical therapy, last in July 2017,    She also complains of migraine, seems to get more frequent since March 2017, she is now having headaches 3-5/week, lasting for 4 hours or longer, with light noise sensitivity nauseous,  She used to get Botox injection as migraine prevention from Dr. Samuel Germany, responded very well, last injection was in 2015, responding well. She is taking Imitrex, Zofran as needed  She is on antibiotics for chronic UTI,  Update June 20 2016: She came in for Botox injection as migraine prevention, also complains worsening bilateral hands tremor, hope to received EMG guided Botox injection for her right hand tremor.  UPDATE Sep 27 2015: She had sudden onset of left shoulder pain, neck pain, I personally reviewed x-ray of the cervical spine, evidence of multilevel degenerative disc disease most severe C5-6 C6-7, mild to moderate bilateral foraminal stenosis  She responded well to previous Botox injection in October 2017, reported 75% improvement, over past 3 months, used Imitrex injection once, 4 tablets of Imitrex 25 mg, works well,  She was not sure about the benefit of EMG guided botulism toxin injection for her right hand tremor,  UPDATE December 19 2016: She complains of frequent headaches since December 16 2016, bilateral occipital region, pressure, pounding, nauseous, light sensitivity, she still complains 7 out of 10 headaches, despite multiple dose of Imitrex 25 mg tablets, and Imitrex injection, she has used up all her supplies, she also noticed increased bilateral hands tremor, feeling electronic sensation underneath her skin, to her shoulder down to her arms and legs, it is out of her control, but she denied anxiety or depression,  She has self adjusted her medication recently, she stopped the Wellbutrin by herself for a while, started about 3 weeks ago, also has run out of propanolol,  Husband also reported that she spent most of time in  her bed, not feeling well.  UPDATE January 02 2017: Her headache overall is under good control, return for Botox injection as migraine prevention, also extra injection for her right hand tremor, writer's cramp  UPDATE Sept 19 2018: She responded well to previous injection for migraine prevention but did not help her essential tremor much,  UPDATE Jun 25 2018: Her husband has stroke in Dec 2018, she has gone through significant emotional and financial stress, suffered a significant depression, attempted suicidal overdose on Percocet in March 2019, she also presented with significant GI symptoms, nausea vomiting abdominal pain, was treated with IV fluid, diagnosed with pancreatitis, was restarted on her antidepression Wellbutrin, trazodone, she is now discharged home, still dealing with significant depression, complains of intermittent diarrhea, stomach pain, lack of energy, She continue have frequent headaches, previous Botox works well for her migraine headaches, wants to continue Botox injection.  I also reviewed her geneSight psychotropic pharmacogenomic test result, that is most related to antipsychotics, antidepression use  UPDATE Jul 17 2018: She continues to have significant headaches, came in for Botox injection,  UPDATE Nov 13 2018: She complains of significant depression today, her husband is in nursing home, suffered a heart attack in January 2020, planning on to call in hospice care,  She complains of worsening  bilateral hands tremor, previously tried primidone, propanolol all significant improvement, clonazepam 0.5 mg seems to help her some, I checked it [place] controlled substance registry, there is no history of overuse,  Her migraine overall is under good control with Botox injection, she is having migraine 1-2 times each week,  She also complains of diffuse body achy pain  Update 05-09-19: Her husband passed away in December 15, 2018, she has gone through a lot of  stress, hospital admission on April 05, 2019 for acute respiratory failure with hypoxia, she was considered to have multifactorial restrictive disease due to chronic basilar scarring of the lung/atelectasis, obstructive sleep apnea, obesity, hypoventilation syndrome, she was discharged home with nighttime oxygen, she was diagnosed with sleep apnea obstructive many years ago, but no longer using her CPAP machine  She continue have intermittent migraine headaches, no longer uses triptan, Fioricet helps some, previous Botox injection is helpful, especially to alleviated her neck tension   REVIEW OF SYSTEMS: Full 14 system review of systems performed and notable only for above   ALLERGIES: Allergies  Allergen Reactions   Bee Venom Anaphylaxis   Morphine And Related    Tape Rash    Rash - only use paper tape   Etodolac Diarrhea and Nausea And Vomiting   Morphine Other (See Comments)    Hallucinations   Other Itching and Swelling    Bee venom   Poison Ivy Extract  [Poison Ivy Extract] Itching and Swelling   Propoxyphene Nausea And Vomiting   Propoxyphene N-Acetaminophen Nausea And Vomiting   Penicillins Rash    Has patient had a PCN reaction causing immediate rash, facial/tongue/throat swelling, SOB or lightheadedness with hypotension: Yes Has patient had a PCN reaction causing severe rash involving mucus membranes or skin necrosis: No Has patient had a PCN reaction that required hospitalization No Has patient had a PCN reaction occurring within the last 10 years: No If all of the above answers are "NO", then may proceed with Cephalosporin use.     HOME MEDICATIONS: Current Outpatient Prescriptions  Medication Sig Dispense Refill   albuterol (PROVENTIL HFA;VENTOLIN HFA) 108 (90 BASE) MCG/ACT inhaler Inhale 1 puff into the lungs every 6 (six) hours as needed for wheezing or shortness of breath.     albuterol (PROVENTIL) (2.5 MG/3ML) 0.083% nebulizer solution Take 2.5 mg by  nebulization every 6 (six) hours as needed for wheezing or shortness of breath.     benzonatate (TESSALON) 100 MG capsule Take 100 mg by mouth 3 (three) times daily as needed for cough.     clonazePAM (KLONOPIN) 0.5 MG tablet Take 0.5 mg by mouth at bedtime.     cyclobenzaprine (FLEXERIL) 10 MG tablet Take 10 mg by mouth at bedtime.      EPINEPHrine (EPIPEN) 0.3 mg/0.3 mL DEVI Inject 0.3 mg into the muscle once as needed (Allergic reaction).      fluorometholone (FML) 0.1 % ophthalmic suspension Place 1 drop into both eyes daily as needed (Eye allergies).   0   fluticasone (FLONASE) 50 MCG/ACT nasal spray Place 1 spray into both nostrils daily as needed for allergies or rhinitis.     gabapentin (NEURONTIN) 800 MG tablet Take 800-1,600 mg by mouth 2 (two) times daily. 800 in the morning and 1600 at pm     HYDROcodone-acetaminophen (NORCO/VICODIN) 5-325 MG per tablet Take one to two tablets by mouth every 4 hours as needed for pain. Do not exceed 4gm of Tylenol in 24 hours 360 tablet 0  levothyroxine (SYNTHROID, LEVOTHROID) 25 MCG tablet Take 25 mcg by mouth every morning.      methocarbamol (ROBAXIN) 500 MG tablet Take 1 tablet (500 mg total) by mouth every 6 (six) hours as needed for muscle spasms. 90 tablet 0   mometasone-formoterol (DULERA) 100-5 MCG/ACT AERO Inhale 2 puffs into the lungs 2 (two) times daily.     nystatin (MYCOSTATIN) 100000 UNIT/ML suspension Take 5 mLs by mouth daily as needed (Yeast).     ondansetron (ZOFRAN) 4 MG tablet Take 4 mg by mouth every 8 (eight) hours as needed for nausea or vomiting.     ondansetron (ZOFRAN-ODT) 8 MG disintegrating tablet Take 8 mg by mouth every 8 (eight) hours as needed for nausea or vomiting.     oxyCODONE (OXY IR/ROXICODONE) 5 MG immediate release tablet Take 1-2 tablets (5-10 mg total) by mouth every 3 (three) hours as needed for moderate pain or severe pain. 90 tablet 0   pantoprazole (PROTONIX) 40 MG tablet Take 40 mg by mouth  daily.     Phenazopyridine HCl (AZO TABS PO) Take 1 tablet by mouth daily as needed (UTI).     primidone (MYSOLINE) 50 MG tablet Take 50 mg by mouth at bedtime.     promethazine (PHENERGAN) 25 MG tablet Take 25 mg by mouth every 6 (six) hours as needed for nausea or vomiting.     Rivaroxaban (XARELTO) 15 MG TABS tablet Take 1 tablet (15 mg total) by mouth 2 (two) times daily with a meal. 60 tablet 5   rizatriptan (MAXALT) 10 MG tablet Take 10 mg by mouth as needed for migraine (migraine). May repeat in 2 hours if needed     sertraline (ZOLOFT) 100 MG tablet Take 100 mg by mouth daily.     sitaGLIPtin (JANUVIA) 100 MG tablet Take 100 mg by mouth every evening.      SUMAtriptan (IMITREX) 6 MG/0.5ML SOLN injection Inject 6 mg into the skin every 2 (two) hours as needed for migraine or headache. May repeat in 2 hours if headache persists or recurs.     valACYclovir (VALTREX) 1000 MG tablet Take 2,000 mg by mouth 2 (two) times daily as needed (Fever blisters).   2     PAST MEDICAL HISTORY: Past Medical History:  Diagnosis Date   Anemia    Anxiety    Bronchitis    hx of    Clotting disorder ([place]) 02-17-14   S/p appendectomy-developed Pulmonary emboli-tx. warfarin-d/c 1 yr ago.   Colon polyps    Depression    Diabetes mellitus without complication ([place])    "borderline"- oral med   Disorder of vocal cords    spasmotic dysphonia ,02-17-14 has" whispery voice-low tone"   Hyperthyroidism    Kidney stones    Migraines    Nausea Nov 15, 2014   cycle of migraine headaches and nausea   Neuromuscular disorder ([place])    hands and throat"spasmodic dysphonia", tremors - thimb and forefinger    Obesity, Class III, BMI 40-49.9 (morbid obesity) ([place]) 04/27/2010   02-17-14 reports some weight loss- intentional   Osteoarthritis    in back(chronic pain)   Pelvic pain    02-17-14 "states thinks its back related on left groin"   Pulmonary emboli ([facility])    s/p Appendectomy '13 [facility]    Reflux    Right knee pain    Sleep apnea    cpap - settings at 4    Toxic goiter     PAST SURGICAL HISTORY: Past  Surgical History:  Procedure Laterality Date   ABDOMINAL HYSTERECTOMY     APPENDECTOMY     3'13 [facility] s/p developed  Pulmonary Emboli   BOTOX INJECTION     for migranes   CATARACT EXTRACTION, BILATERAL Bilateral 03/2013    CHOLECYSTECTOMY     HAND SURGERY  2004   both hands   KIDNEY STONE SURGERY     KNEE ARTHROSCOPY  1998   right   LAPAROSCOPIC APPENDECTOMY  11/27/2011   Procedure: APPENDECTOMY LAPAROSCOPIC;  Surgeon: Adin Hector, MD;  Location: [facility];  Service: General;  Laterality: N/A;   SPINAL CORD STIMULATOR IMPLANT  06/04/2016   Dr. Alfonse Ras at [place] Right 12/06/2014   Procedure: RIGHT TOTAL KNEE ARTHROPLASTY;  Surgeon: Gaynelle Arabian, MD;  Location: [facility];  Service: Orthopedics;  Laterality: Right;   TOTAL KNEE REVISION Left 02/24/2014   Procedure: LEFT TOTAL KNEE REVISION;  Surgeon: Gearlean Alf, MD;  Location: [facility];  Service: Orthopedics;  Laterality: Left;   vocal cord surgery  02-17-14   01-12-14 done in [place] [facility](s/p selective denervation/reinnervation recurrent laryngeal nerve surgery)   zenkers diverticulum      FAMILY HISTORY: Family History  Problem Relation Age of Onset   Heart disease Mother    Pneumonia Father    Cirrhosis Father    Hypertension Father    Alcoholism Father    Heart attack Paternal Grandfather    Heart attack Maternal Grandfather    Heart attack Paternal Grandmother    Heart attack Maternal Grandmother     SOCIAL HISTORY:  Social History   Socioeconomic History   Marital status: Married    Spouse name: Not on file   Number of children: 5   Years of education: 16   Highest education level: Not on file  Occupational History   Occupation: Retired  Scientist, product/process development strain: Somewhat hard   Food insecurity     Worry: Sometimes true    Inability: Sometimes true   Transportation needs    Medical: Yes    Non-medical: Yes  Tobacco Use   Smoking status: Former Smoker    Quit date: 01/03/1982    Years since quitting: 37.3   Smokeless tobacco: Never Used  Substance and Sexual Activity   Alcohol use: No   Drug use: No   Sexual activity: Not Currently  Lifestyle   Physical activity    Days per week: 0 days    Minutes per session: 0 min   Stress: Very much  Relationships   Social connections    Talks on phone: Once a week    Gets together: Once a week    Attends religious service: More than 4 times per year    Active member of club or organization: No    Attends meetings of clubs or organizations: Never    Relationship status: Separated   Intimate partner violence    Fear of current or ex partner: No    Emotionally abused: No    Physically abused: No    Forced sexual activity: No  Other Topics Concern   Not on file  Social History Narrative   Lives at home alone at this time.   Right-handed.   2 cups caffeine per day.     PHYSICAL EXAM   Vitals:   05/04/19 1423  BP: 101/62  Pulse: 61  Temp: 98.5 F (36.9 C)  Weight: 206 lb 8 oz (93.7 kg)  Height: 5\' 6"  (1.676 m)    Not recorded      Body mass index is 33.33 kg/m.  PHYSICAL EXAMNIATION:  Gen: NAD, conversant, well nourised, obese, well groomed                     Cardiovascular: Regular rate rhythm, no peripheral edema, warm, nontender. Eyes: Conjunctivae clear without exudates or hemorrhage Neck: Supple, no carotid bruise. Pulmonary: Clear to auscultation bilaterally   NEUROLOGICAL EXAM:  MENTAL STATUS: Speech:    Mild dysphonic speech; fluent and spontaneous with normal comprehension.  Cognition:     Orientation to time, place and person     Normal recent and remote memory     Normal Attention span and concentration     Normal Language, naming, repeating,spontaneous speech     Fund of  knowledge   CRANIAL NERVES: CN II: Visual fields are full to confrontation.  Pupils are round equal and briskly reactive to light. CN III, IV, VI: extraocular movement are normal. No ptosis. CN V: Facial sensation is intact to pinprick in all 3 divisions bilaterally. Corneal responses are intact.  CN VII: Face is symmetric with normal eye closure and smile. CN VIII: Hearing is normal to rubbing fingers CN IX, X: Palate elevates symmetrically. Phonation is normal. CN XI: Head turning and shoulder shrug are intact CN XII: Tongue is midline with normal movements and no atrophy.  MOTOR: Mild bilateral hands postural tremor, normal muscle tone, bulk, strength, Excessive right hand/wrist flexion/ extension,   REFLEXES: Reflexes are 2+ and symmetric at the biceps, triceps, knees, and ankles. Plantar responses are flexor.  SENSORY: Intact to light touch, pinprick, position sense, and vibration sense are intact in fingers and toes.  COORDINATION: Rapid alternating movements and fine finger movements are intact. There is no dysmetria on finger-to-nose and heel-knee-shin.    GAIT/STANCE: Posture is normal. Gait is cautious, mildly unsteady  DIAGNOSTIC DATA (LABS, IMAGING, TESTING) - I reviewed patient records, labs, notes, testing and imaging myself where available.   ASSESSMENT AND PLAN  Hayley [name] is a 73 y.o. female   Chronic migraine headaches  Concurrent with her worsening depression  Zofran, NSAIDs Fioricet as needed as needed.  Return to clinic for Botox injections as migraine prevention  Essential tremor  Worsening symptoms concurrent with her stress, depression,  Continue Inderal 60 mg twice a day  History of obstructive sleep apnea  Refer to sleep study   Marcial Pacas, M.D. Ph.D.  [facility] [address], [place],  [address] Ph: [phone] Fax: [phone]  CC: To Dr. Daryll Brod, Flora Lipps

## 2019-05-05 ENCOUNTER — Other Ambulatory Visit: Payer: Self-pay | Admitting: *Deleted

## 2019-05-05 NOTE — Telephone Encounter (Signed)
Noted, thank you. DW  °

## 2019-05-05 NOTE — Patient Outreach (Signed)
[facility]) Care Management  05/05/2019  LOVENE MARET November 11, 1945 [id number]   Referral received 8/14 Initial outreach 05/05/2019 Primary provider's office to complete Transition of care-Dischared 04/05/2019  RN attempted outreach call today however un successful. RN able to leave a HIPAA approved voice message requesting a call back.  Will follow up within 4 business days with another outreach call.  Raina Mina, RN Care Management Coordinator [place] Office [phone]

## 2019-05-05 NOTE — Telephone Encounter (Signed)
Here one Danielle I did not get to .

## 2019-05-06 DIAGNOSIS — F322 Major depressive disorder, single episode, severe without psychotic features: Secondary | ICD-10-CM | POA: Diagnosis not present

## 2019-05-06 DIAGNOSIS — J45909 Unspecified asthma, uncomplicated: Secondary | ICD-10-CM | POA: Diagnosis not present

## 2019-05-06 DIAGNOSIS — E662 Morbid (severe) obesity with alveolar hypoventilation: Secondary | ICD-10-CM | POA: Diagnosis not present

## 2019-05-06 DIAGNOSIS — E1141 Type 2 diabetes mellitus with diabetic mononeuropathy: Secondary | ICD-10-CM | POA: Diagnosis not present

## 2019-05-06 DIAGNOSIS — J9601 Acute respiratory failure with hypoxia: Secondary | ICD-10-CM | POA: Diagnosis not present

## 2019-05-06 DIAGNOSIS — E039 Hypothyroidism, unspecified: Secondary | ICD-10-CM | POA: Diagnosis not present

## 2019-05-08 DIAGNOSIS — M48061 Spinal stenosis, lumbar region without neurogenic claudication: Secondary | ICD-10-CM | POA: Diagnosis not present

## 2019-05-08 DIAGNOSIS — G43709 Chronic migraine without aura, not intractable, without status migrainosus: Secondary | ICD-10-CM | POA: Diagnosis not present

## 2019-05-08 DIAGNOSIS — J9601 Acute respiratory failure with hypoxia: Secondary | ICD-10-CM | POA: Diagnosis not present

## 2019-05-08 DIAGNOSIS — G4733 Obstructive sleep apnea (adult) (pediatric): Secondary | ICD-10-CM | POA: Diagnosis not present

## 2019-05-08 DIAGNOSIS — E1141 Type 2 diabetes mellitus with diabetic mononeuropathy: Secondary | ICD-10-CM | POA: Diagnosis not present

## 2019-05-08 DIAGNOSIS — F419 Anxiety disorder, unspecified: Secondary | ICD-10-CM | POA: Diagnosis not present

## 2019-05-08 DIAGNOSIS — J45909 Unspecified asthma, uncomplicated: Secondary | ICD-10-CM | POA: Diagnosis not present

## 2019-05-08 DIAGNOSIS — K219 Gastro-esophageal reflux disease without esophagitis: Secondary | ICD-10-CM | POA: Diagnosis not present

## 2019-05-08 DIAGNOSIS — G250 Essential tremor: Secondary | ICD-10-CM | POA: Diagnosis not present

## 2019-05-08 DIAGNOSIS — G894 Chronic pain syndrome: Secondary | ICD-10-CM | POA: Diagnosis not present

## 2019-05-08 DIAGNOSIS — E039 Hypothyroidism, unspecified: Secondary | ICD-10-CM | POA: Diagnosis not present

## 2019-05-08 DIAGNOSIS — H15101 Unspecified episcleritis, right eye: Secondary | ICD-10-CM | POA: Diagnosis not present

## 2019-05-08 DIAGNOSIS — M4120 Other idiopathic scoliosis, site unspecified: Secondary | ICD-10-CM | POA: Diagnosis not present

## 2019-05-08 DIAGNOSIS — H04123 Dry eye syndrome of bilateral lacrimal glands: Secondary | ICD-10-CM | POA: Diagnosis not present

## 2019-05-08 DIAGNOSIS — I781 Nevus, non-neoplastic: Secondary | ICD-10-CM | POA: Diagnosis not present

## 2019-05-08 DIAGNOSIS — E662 Morbid (severe) obesity with alveolar hypoventilation: Secondary | ICD-10-CM | POA: Diagnosis not present

## 2019-05-08 DIAGNOSIS — D689 Coagulation defect, unspecified: Secondary | ICD-10-CM | POA: Diagnosis not present

## 2019-05-08 DIAGNOSIS — R011 Cardiac murmur, unspecified: Secondary | ICD-10-CM | POA: Diagnosis not present

## 2019-05-08 DIAGNOSIS — K5730 Diverticulosis of large intestine without perforation or abscess without bleeding: Secondary | ICD-10-CM | POA: Diagnosis not present

## 2019-05-08 DIAGNOSIS — K589 Irritable bowel syndrome without diarrhea: Secondary | ICD-10-CM | POA: Diagnosis not present

## 2019-05-08 DIAGNOSIS — I700 Atherosclerosis of aorta: Secondary | ICD-10-CM | POA: Diagnosis not present

## 2019-05-08 DIAGNOSIS — M7918 Myalgia, other site: Secondary | ICD-10-CM | POA: Diagnosis not present

## 2019-05-08 DIAGNOSIS — F322 Major depressive disorder, single episode, severe without psychotic features: Secondary | ICD-10-CM | POA: Diagnosis not present

## 2019-05-08 DIAGNOSIS — M5136 Other intervertebral disc degeneration, lumbar region: Secondary | ICD-10-CM | POA: Diagnosis not present

## 2019-05-08 DIAGNOSIS — J383 Other diseases of vocal cords: Secondary | ICD-10-CM | POA: Diagnosis not present

## 2019-05-08 DIAGNOSIS — M25519 Pain in unspecified shoulder: Secondary | ICD-10-CM | POA: Diagnosis not present

## 2019-05-11 ENCOUNTER — Other Ambulatory Visit: Payer: Self-pay | Admitting: *Deleted

## 2019-05-11 NOTE — Patient Outreach (Signed)
[place] [facility]) Care Management  05/11/2019  Hayley [name] 1946/03/11 [id number]    Outreach attempt #2-Successful- Decline Note pt transitioning over to this RN from [place] spoke with pt today and introduced the [facility] program and purpose for today's call. Pt receptive as serveral topics were discussed. Pt verified she has visited with her provider since her recent discharged in July. RN offered to review medications however pt declined and indicated she has an appointment via telephonically with [place] in the AM to further discussed her medical issues (in need of her albuterol nebs and inhalers. Will further discuss with [place] in the AM. RN attempted to call Ralene Bathe [facility] Pharmacist) with update on today's discussion with this pt (left HIPAA voice message requesting a call back). RN inquired on the following:  DIABETES: Pt reports she checks her glucose readings every 2 weeks with readings around 100 with no problems reported. States she manages her diabetes with healthy eating habits with no medication administered. HTN: Pt states her BP is "good" and she is on BP medication more for her tremors and pulse regularity. Again no major issues related to his diagnosis. HOME O2: Pt reports she uses 3 liter and has a pulse ox for daily monitoring is needed.  MEDICATIONS: Reports she has an appointment with [place] who will be addressing her needs for respiratory refills on here nebs and inhalers. States she needs all the help she can get at this time.   Other involved services with [facility] for ongoing PT services assisting pt with her ongoing vertigo issues. Pt states she has informed her Neurologist however limited interventions so she will get her primary provider involved. RN offered to contact her provider however pt declined indicating she would call. RN stress the importance of working with her PT with [place] and if she had additional  request to alert her therapist for further interventions (verbalized an understanding). Pt is aware the importance of fall prevention.   RN inquired on any other issues that she may need assistance with at this time. Pt with no issues RN can address but mentioned trouble with her digestive system issues with cramps, diarrhea and some nausea. States she is taking her medications and indicates she will contact her GI specialist for interventions. Again RN offered to assist with calling her provider but pt declined this assistance. States she may call later in the week due to all her calls she is currently receiving but she will call and follow up.  RN offered to continue with [facility] services via prevention measures and/or address the plan that is in place one however pt indicated she only uses her phone for communication and prefers not to received ongoing calls with no immediate issues at this time. Offered to space any [facility] call out as she request however pt verified she has the [facility] packet and magnet and my contact to call is and when she may needs [facility] services from this RN case manager.  No other issues or request at this time as RN offered to follow up with Ralene Bathe [facility] pharmacist) concerning her call tomorrow and notify her provider with an update on her disposition with [facility] services at this time. This discipline will be closed however case will remain active with pharmacy.   Raina Mina, RN Care Management Coordinator [place] Office [phone]

## 2019-05-12 ENCOUNTER — Other Ambulatory Visit: Payer: Self-pay | Admitting: Pharmacist

## 2019-05-12 ENCOUNTER — Ambulatory Visit: Payer: Self-pay | Admitting: Pharmacist

## 2019-05-12 NOTE — Patient Outreach (Signed)
[facility]) Care Management  [place]  05/12/2019  SIDNI DREYFUSS August 30, 1946 [id number]   Reason for referral: Medication Assistance, Medication Review  Referral source: [facility] RN Current insurance: Unknown  Incoming call from patient on 05/11/2019 and appt made for medication review today.  Unsuccessful telephone call attempt #1 to patient.   HIPAA compliant voicemail left requesting a return call   Discussed patient case with [facility] RN.  Patient close to running out of nebulizers.  She has home health set up with [facility].  [facility] RN will close case.    Plan:  -I will make another outreach attempt to patient within 3-4 business days.    Ralene Bathe, PharmD, [place] [phone]

## 2019-05-13 ENCOUNTER — Other Ambulatory Visit: Payer: Self-pay | Admitting: Pharmacist

## 2019-05-13 DIAGNOSIS — J45909 Unspecified asthma, uncomplicated: Secondary | ICD-10-CM | POA: Diagnosis not present

## 2019-05-13 DIAGNOSIS — E039 Hypothyroidism, unspecified: Secondary | ICD-10-CM | POA: Diagnosis not present

## 2019-05-13 DIAGNOSIS — E662 Morbid (severe) obesity with alveolar hypoventilation: Secondary | ICD-10-CM | POA: Diagnosis not present

## 2019-05-13 DIAGNOSIS — F322 Major depressive disorder, single episode, severe without psychotic features: Secondary | ICD-10-CM | POA: Diagnosis not present

## 2019-05-13 DIAGNOSIS — J9601 Acute respiratory failure with hypoxia: Secondary | ICD-10-CM | POA: Diagnosis not present

## 2019-05-13 DIAGNOSIS — E1141 Type 2 diabetes mellitus with diabetic mononeuropathy: Secondary | ICD-10-CM | POA: Diagnosis not present

## 2019-05-13 NOTE — Patient Outreach (Addendum)
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 No acute issues noted, CT neg for large PE however could not rule out segmental PE, Eliquis dose increased 2.5mg  --> 5mg  BID.    Outreach:  Incoming telephone call from Ms. Wahid.  HIPAA identifiers verified.   Subjective:  Patient reports she has had [facility] but they may be ending services.  She reports she has had nebulizer and inhaler albuterol on her profile but has not used these since 2016.  She now has f/u appt with [facility] pulmonologist in October.  She reports she was given a Dulera inhaler to use PRN after hospitalization in July but has not needed it.    Patient has many questions about her anticoagulation and signs / symptoms of VTEs.  She is very upset about not being told more information about her need for anticoagulation and if it will be life-long.  She reports she is receiving the Eliquis at no charge through the [facility] patient assistance program and was assisted with application by Dr. Jason Nest office.  She reports she used to see Dr. [name], hematologist/oncologist for coagulopathy.  Per office note on 12/12/2016, Dr. [name] notes to continue apixaban indefinitely unless patient loses a significant amount of weight.  Hypercoagulable  panel negative in 2016 for abnormalities except IgM antibody was low-medium positive.   Objective: The ASCVD Risk score Mikey Bussing DC Jr., et al., 2013) failed to calculate for the following reasons:   Cannot find a previous HDL lab   Cannot find a previous total cholesterol lab  Lab Results  Component Value Date   CREATININE 0.71 04/04/2019   CREATININE 0.84 04/01/2019   CREATININE 0.75 03/31/2019    Lab Results  Component Value Date   HGBA1C 6.4 (H) 04/02/2019    Lipid Panel     Component Value Date/Time   CHOL 159 03/23/2016 0616   TRIG 122 11/17/2017 2213   HDL 31 (L) 03/23/2016 0616   CHOLHDL 5.1 03/23/2016 0616   VLDL 35 03/23/2016 0616   LDLCALC 93 03/23/2016 0616    BP Readings from Last 3 Encounters:  05/04/19 101/62  04/05/19 106/65  11/12/18 96/61    Allergies  Allergen Reactions  . Bee Venom Anaphylaxis  . Morphine And Related   . Tape Rash    Rash - only use paper tape  . Etodolac Diarrhea and Nausea And Vomiting  . Morphine Other (See Comments)    Hallucinations  . Other Itching and Swelling    Bee venom  . Poison Ivy Extract  [Poison Ivy Extract] Itching and Swelling  . Propoxyphene Nausea And Vomiting  . Propoxyphene N-Acetaminophen Nausea And Vomiting  . Penicillins Rash    Has patient had a PCN reaction causing immediate rash, facial/tongue/throat swelling, SOB or lightheadedness with hypotension: Yes Has patient had a PCN reaction causing severe rash involving mucus membranes or skin necrosis: No Has patient had a PCN reaction that required  hospitalization No Has patient had a PCN reaction occurring within the last 10 years: No If all of the above answers are "NO", then may proceed with Cephalosporin use.     Medications Reviewed Today    Reviewed by Desmond Lope, RN (Registered Nurse) on 05/04/19 at 1445  Med List Status: <None>  Medication Order Taking? Sig Documenting Provider Last Dose Status Informant  albuterol (PROVENTIL  HFA;VENTOLIN HFA) 108 (90 BASE) MCG/ACT inhaler [id number] Yes Inhale 1 puff into the lungs every 6 (six) hours as needed for wheezing or shortness of breath. [provider] Taking Active Self  albuterol (PROVENTIL) (2.5 MG/3ML) 0.083% nebulizer solution [id number] Yes Take 2.5 mg by nebulization every 6 (six) hours as needed for wheezing or shortness of breath. Reported on 01/10/2016 [provider] Taking Active Self  apixaban (ELIQUIS) 5 MG TABS tablet [id number] Yes Take 1 tablet (5 mg total) by mouth 2 (two) times daily. Guilford Shi, MD Taking Active   butalbital-acetaminophen-caffeine [facility]) [phone] MG tablet [id number] Yes Take 1 tablet by mouth every 6 (six) hours as needed for headache or migraine. Guilford Shi, MD Taking Active   cyanocobalamin (,VITAMIN B-12,) 1000 MCG/ML injection [id number] Yes Inject 1 mL into the skin every 30 (thirty) days. [provider] Taking Active Self           Med Note Nash Mantis, TIFFANI S   Wed Apr 01, 2019 11:54 AM)    diclofenac (FLECTOR) 1.3 % PTCH [id number] Yes Place 1 patch onto the skin daily. Guilford Shi, MD Taking Active            Med Note Redmond Baseman May 04, 2019  2:40 PM) Being prescribed by [facility].  She will contact them for refills and approval w/ insurance.  hydrOXYzine (ATARAX/VISTARIL) 25 MG tablet [id number] Yes Take 1 tablet (25 mg total) by mouth 3 (three) times daily as needed for anxiety, itching or nausea. Guilford Shi, MD Taking Active   hyoscyamine (ANASPAZ) 0.125 MG TBDP disintergrating tablet [id number] Yes Place 0.125 mg under the tongue every 4 (four) hours as needed for bladder spasms or cramping.  [provider] Taking Active Self  levothyroxine (SYNTHROID, LEVOTHROID) 25 MCG tablet [id number] Yes Take 25 mcg by mouth every morning.  [provider] Taking Active Self  metoCLOPramide (REGLAN) 5 MG tablet [id number] Yes Take 1 tablet (5 mg total) by  mouth 3 (three) times daily with meals. Guilford Shi, MD Taking Active   mometasone-formoterol [facility]) 100-5 MCG/ACT Hollie Salk [id number] Yes Inhale 2 puffs into the lungs 2 (two) times daily as needed for wheezing or shortness of breath.  [provider] Taking Active Self           Med Note Nash Mantis, TIFFANI S   Wed Apr 01, 2019 11:55 AM)    MYRBETRIQ 50 MG TB24 tablet [id number] Yes Take 50 mg by mouth at bedtime.  [provider] Taking Active Self  OnabotulinumtoxinA (BOTOX IM) [id number] Yes Inject 200 Units into the muscle every 3 (three) months. [provider] Taking Active Self  ondansetron (ZOFRAN-ODT) 8 MG disintegrating tablet [id number] Yes Take 8 mg by mouth daily as needed for nausea.  [provider] Taking Active Self  propranolol (INDERAL) 60 MG tablet [id number] Yes Take 60 mg by mouth 2 (two) times daily. [name], MD Taking Active Self  simethicone [facility]) 80 MG chewable tablet [id number] Yes Chew 1 tablet (80 mg total) by mouth 4 (four) times daily as needed  for flatulence (belching). Guilford Shi, MD Taking Active   sitaGLIPtin (JANUVIA) 25 MG tablet [id number] Yes Take 1 tablet (25 mg total) by mouth daily. Guilford Shi, MD Taking Active   traZODone (DESYREL) 100 MG tablet [id number] Yes Take 1/2 to one tab at bed time as needed Arfeen, [name], MD Taking Active   valACYclovir (VALTREX) 1000 MG tablet [id number] Yes Take 2,000 mg by mouth 2 (two) times daily as needed (Fever blisters). Reported on 01/10/2016 [provider] Taking Active Self           Med Note (CHAMBERS, ELIZABETH A   Fri May 01, 2019 12:02 PM) Need more  vortioxetine HBr (TRINTELLIX) 20 MG TABS tablet [id number] Yes Take 1 tablet (20 mg total) by mouth daily. [name], MD Taking Active           Assessment: Unable to complete medication review during telephone conversation as patient spent significant amount of time talking about her spouse's  decline and her subsequent depression and suicide attempt.    Plan: . Will call patient again within 3-4 days to complete medication review . Will reach out to Dr. Justin Mend regarding need for albuterol inhaler / nebulizer and [facility] therapy.  If patient to continue therapy, she will need new RXs.   . Patient will reach out to make f/u appt with Dr. [name] to further discuss anticoagulation  Ralene Bathe, PharmD, [place] [phone]

## 2019-05-14 ENCOUNTER — Telehealth: Payer: Self-pay | Admitting: Oncology

## 2019-05-14 NOTE — Telephone Encounter (Signed)
Returned patient's phone call regarding scheduling an appointment, patient is requesting a follow up and lab visit. Currently there is no active orders, I sent a staff message to provider and nurse for more instructions. Patient will get call back.

## 2019-05-15 ENCOUNTER — Telehealth: Payer: Self-pay | Admitting: Oncology

## 2019-05-15 NOTE — Telephone Encounter (Signed)
Called pt per 8/27 sch message - no answer . Left message with apt date and time

## 2019-05-18 ENCOUNTER — Other Ambulatory Visit: Payer: Self-pay | Admitting: Pharmacist

## 2019-05-18 ENCOUNTER — Ambulatory Visit: Payer: Self-pay | Admitting: Pharmacist

## 2019-05-18 NOTE — Patient Outreach (Signed)
[facility]) Care Management  [place]  05/18/2019  JEZREEL VEAL 21-May-1946 [id number]  Reason for call: f/u to complete medication review, inhaler therapy clarification  Outreach:  Unsuccessful telephone call attempt #1 to patient. Unable to leave message   Voicemail received from [facility] at Dr. Jason Nest office.  Return call and message left.    Noted patient has appt scheduled with Dr. [name] on 06/01/2019  Plan:  -I will make another outreach attempt to patient within 3-4 business days.   -Will await call back again from PCP office  Ralene Bathe, PharmD, [place] [phone]

## 2019-05-20 ENCOUNTER — Ambulatory Visit: Payer: Self-pay | Admitting: Pharmacist

## 2019-05-20 ENCOUNTER — Other Ambulatory Visit: Payer: Self-pay | Admitting: Pharmacist

## 2019-05-20 NOTE — Patient Outreach (Signed)
[name]) Care Management  [place] 05/20/2019  Hayley [name] [birth year] [id number]  Reason for call: f/u on medication review, medication questions on inhaler therapy and anticoagulation  Outreach: Successful call with patient.  HIPAA identifiers verified.   Reviewed update from Dr. Jason Nest office that Dr. Justin Mend will send in new prescriptions for albuterol inhaler and Dulera inahler for patient to have for PRN use.  Patient voiced understanding.   Patient reports she has appt with hematologist Dr. [name] scheduled 06/01/2019 and will discuss anticoagulation questions with provider.   Patient agreeable to review medications.  Medications Reviewed Today    Reviewed by Rudean Haskell, RPH (Pharmacist) on 05/20/19 at 1415  Med List Status: <None>  Medication Order Taking? Sig Documenting Provider Last Dose Status Informant  albuterol (PROVENTIL HFA;VENTOLIN HFA) 108 (90 BASE) MCG/ACT inhaler [id number] No Inhale 1 puff into the lungs every 6 (six) hours as needed for wheezing or shortness of breath. [provider] Not Taking Active Self  albuterol (PROVENTIL) (2.5 MG/3ML) 0.083% nebulizer solution [id number] No Take 2.5 mg by nebulization every 6 (six) hours as needed for wheezing or shortness of breath. Reported on 01/10/2016 [provider] Not Taking Active Self  apixaban (ELIQUIS) 5 MG TABS tablet [id number] Yes Take 5 mg by mouth 2 (two) times daily. [provider] Taking Active   butalbital-acetaminophen-caffeine (FIORICET) 50-325-40 MG tablet [id number] Yes Take 1 tablet by mouth every 6 (six) hours as needed for headache or migraine. Guilford Shi, MD Taking Active   cyanocobalamin (,VITAMIN B-12,) 1000 MCG/ML injection [id number] Yes Inject 1 mL into the skin every 30 (thirty) days. [provider] Taking Active Self           Med Note Nash Mantis, TIFFANI S   Wed Apr 01, 2019 11:54 AM)    diclofenac (FLECTOR) 1.3 % PTCH  [id number] Yes Place 1 patch onto the skin daily. Guilford Shi, MD Taking Active            Med Note [name] May 04, 2019  2:40 PM) Being prescribed by [facility].  She will contact them for refills and approval w/ insurance.  hydrOXYzine (ATARAX/VISTARIL) 25 MG tablet [id number] Yes Take 1 tablet (25 mg total) by mouth 3 (three) times daily as needed for anxiety, itching or nausea. Guilford Shi, MD Taking Active   hyoscyamine (ANASPAZ) 0.125 MG TBDP disintergrating tablet [id number] Yes Place 0.125 mg under the tongue every 4 (four) hours as needed for bladder spasms or cramping.  [provider] Taking Active Self  levothyroxine (SYNTHROID, LEVOTHROID) 25 MCG tablet [id number] Yes Take 25 mcg by mouth every morning.  [provider] Taking Active Self  metoCLOPramide (REGLAN) 5 MG tablet [id number] Yes Take 5 mg by mouth 3 (three) times daily as needed for nausea. [provider] Taking Active   mometasone-formoterol [facility]) 100-5 MCG/ACT Hollie Salk [id number] Yes Inhale 2 puffs into the lungs 2 (two) times daily as needed for wheezing or shortness of breath.  [provider] Taking Active Self           Med Note Nash Mantis, TIFFANI S   Wed Apr 01, 2019 11:55 AM)    MYRBETRIQ 50 MG TB24 tablet [id number] Yes Take 50 mg by mouth at bedtime.  [provider] Taking Active Self  OnabotulinumtoxinA (BOTOX IM) [id number] Yes Inject 200 Units into the muscle every 3 (three) months. [provider] Taking Active Self  ondansetron (ZOFRAN-ODT) 8 MG disintegrating  tablet [id number] Yes Take 8 mg by mouth daily as needed for nausea.  [provider] Taking Active Self  propranolol (INDERAL) 60 MG tablet [id number] Yes Take 60 mg by mouth 2 (two) times daily. [name], MD Taking Active Self  simethicone [facility]) 80 MG chewable tablet [id number] Yes Chew 1 tablet (80 mg total) by mouth 4 (four) times daily as needed for flatulence  (belching). Guilford Shi, MD Taking Active   sitaGLIPtin (JANUVIA) 25 MG tablet [id number] Yes Take 25 mg by mouth daily. [provider] Taking Active   traZODone (DESYREL) 100 MG tablet [id number] Yes Take 1/2 to one tab at bed time as needed  Patient taking differently: Take 100 mg by mouth at bedtime. Take 1/2 to one tab at bed time as needed   Arfeen, [place] Harman, MD Taking Active   valACYclovir (VALTREX) 1000 MG tablet [id number] Yes Take 2,000 mg by mouth 2 (two) times daily as needed (Fever blisters). Reported on 01/10/2016 [provider] Taking Active Self           Med Note [facility]ley [name], Hayley [name] E   Wed May 20, 2019  2:12 PM)    vortioxetine HBr (TRINTELLIX) 20 MG TABS tablet [id number] Yes Take 1 tablet (20 mg total) by mouth daily. [place] Nations, MD Taking Active            Drugs sorted by system:  Neurologic/Psychologic:hydroxyzine, propranolol (for tremors/anxiety), trazodone, vortioxetine  Hematologic: apixaban  Pulmonary/Allergy: albuterol nebulizer + inhaler, mometasone-formoterol  Gastrointestinal:hyoscymaine, metoclopramide, ondansetron, simethicone  Endocrine: levothyroxine, sitagliptin  Pain:butalbital-acetaminophen-caffeine, diclofenac patch, onabutlinumtoxinA (Botox) IM for migraines  Infectious Diseases: valacyclovir  Genitourinary: myrbetriq  Vitamins/Minerals/Supplements:vitamin B12   Medication Review Findings:  . Reviewed each medication in detail with patient.  Answered several questions from patient on indication and monitoring for each medication.  Advised patient to make virtual appt with PCP to discuss further questions on blood work for thyroid function, if trial off sitagliptin is appropriate, uncontrolled tremors, need for Myrbetriq, and further need for Botox injections for migraines.  . PA in progress for diclofenac patch, advised patient to f/u with prescriber directly if PA forms not returned to pharmacy this week.    Provided my  phone number to patient if she needs to reach out to me in the future with any other medication related questions.   Plan: -Will route note to PCP -Will close [facility] pharmacy case but am happy to assist in the future as needed  Ralene Bathe, PharmD, [place] [phone]

## 2019-05-21 DIAGNOSIS — J45909 Unspecified asthma, uncomplicated: Secondary | ICD-10-CM | POA: Diagnosis not present

## 2019-05-21 DIAGNOSIS — E662 Morbid (severe) obesity with alveolar hypoventilation: Secondary | ICD-10-CM | POA: Diagnosis not present

## 2019-05-21 DIAGNOSIS — E039 Hypothyroidism, unspecified: Secondary | ICD-10-CM | POA: Diagnosis not present

## 2019-05-21 DIAGNOSIS — E1141 Type 2 diabetes mellitus with diabetic mononeuropathy: Secondary | ICD-10-CM | POA: Diagnosis not present

## 2019-05-21 DIAGNOSIS — J9601 Acute respiratory failure with hypoxia: Secondary | ICD-10-CM | POA: Diagnosis not present

## 2019-05-21 DIAGNOSIS — F322 Major depressive disorder, single episode, severe without psychotic features: Secondary | ICD-10-CM | POA: Diagnosis not present

## 2019-05-27 DIAGNOSIS — Z9981 Dependence on supplemental oxygen: Secondary | ICD-10-CM | POA: Diagnosis not present

## 2019-05-27 DIAGNOSIS — J383 Other diseases of vocal cords: Secondary | ICD-10-CM | POA: Diagnosis not present

## 2019-05-27 DIAGNOSIS — J384 Edema of larynx: Secondary | ICD-10-CM | POA: Diagnosis not present

## 2019-05-27 DIAGNOSIS — R1310 Dysphagia, unspecified: Secondary | ICD-10-CM | POA: Diagnosis not present

## 2019-05-31 NOTE — Progress Notes (Signed)
Thank you [facility]  [phone] [phone]     ID: Hayley [name] DOB: [birth year]  MR#: [id number]  [id number]  Patient Care Team: [name], MD as PCP - General (Family Medicine) Juanita Craver, MD as Consulting Physician (Gastroenterology) Daryll Brod, MD as Consulting Physician (Orthopedic Surgery) Monna Fam, MD as Consulting Physician (Ophthalmology) Jacalyn Lefevre, MD as Consulting Physician (Pulmonary Disease) PCP: [name], MD SU: Michael Boston MD OTHER MD: Gaynelle Arabian MD   CHIEF COMPLAINT: coagulopathy  CURRENT TREATMENT: Apixaban   HISTORY OF PRESENT ILLNESS: From the 06/05/2015 consult note:  "Ms Catanach underwent laparoscopic appendectomy under Dr Johney [place] 11/27/2014 for what was likely a perforated appendix with phlegmon formation. On 12/06/2011 she was noted to be hypoxic and a chest CT/angio was obtained, showing multiple small bilateral PEs. LE dopplers that day were negative. UE dopplers the next day showed only a R cephalic v superficial thrombus.   The patient was started on warfarin, continued for one year. Repeat CT/angio 12/22/2013 showed resolution of the earlier clot.  On 12/06/2014 the patient underwent R TKR. On 12/09/2014 she was noted to be SOB and CT/angio documented bilateral PEs. She was started on Xarelto, later changed by her PCP to Pradaxa. Repeat CT angios 05/15/2014 and 06/03/2015 showed resolution of the earlier clots."  Her subsequent history is as detailed below   INTERVAL HISTORY: Hayley [name] returns today for follow-up and treatment of her coagulopathy. She was last seen here on 12/27/2017.   She continues on Apixaban.  She tolerates this well, with no overt bleeding or bruising problems and she is currently obtaining it at a very good price  Since her last visit here, she underwent a chest xray on 03/31/2019 for shortness of breath and chest discomfort showing: Chronic interstitial changes and  airways thickening, similar to prior studies. No definite acute cardiopulmonary process.  She also underwent a CT of the abdomen and pelvis 03/31/2019 with contrast showing: No acute findings in the abdomen or pelvis. Colonic diverticulosis.  No changes of active diverticulitis. Aortic atherosclerosis. Bibasilar scarring.  Finally, she underwent a CT angiography chest with contrast on 03/31/2019 showing: No evidence of large central or segmental pulmonary embolus. Respiratory motion may limit detection of smaller subsegmental pulmonary emboli.  No acute intrathoracic process.  Coronary atherosclerosis.  Aortic Atherosclerosis (ICD10-I70.0).  At the time of these evaluations her apixaban dose was again increased to 5 mg daily.  That is her current dose   REVIEW OF SYSTEMS: Tamakia is able to mow her lawn and do her vacuuming at home.  She is mourning her husband who died in Dec 21, 2022    PAST MEDICAL HISTORY: Past Medical History:  Diagnosis Date  . Anemia   . Anxiety   . Bronchitis    hx of   . Clotting disorder ([place]) 02-17-14   S/p appendectomy-developed Pulmonary emboli-tx. warfarin-d/c 1 yr ago.  . Colon polyps   . Depression   . Diabetes mellitus without complication ([place])    "borderline"- oral med  . Disorder of vocal cords    spasmotic dysphonia ,02-17-14 has" whispery voice-low tone"  . Hyperthyroidism   . Kidney stones   . Migraines   . Nausea Nov 15, 2014   cycle of migraine headaches and nausea  . Neuromuscular disorder ([place])    hands and throat"spasmodic dysphonia", tremors - thimb and forefinger   . Obesity, Class III, BMI 40-49.9 (morbid obesity) ([place]) 04/27/2010   02-17-14 reports some weight loss- intentional  .  Osteoarthritis    in back(chronic pain)  . Pelvic pain    02-17-14 "states thinks its back related on left groin"  . Pulmonary emboli [facility])    s/p Appendectomy '13 [facility]  . Reflux   . Right knee pain   . Sleep apnea    cpap - settings at 4   . Toxic goiter      PAST SURGICAL HISTORY: Past Surgical History:  Procedure Laterality Date  . ABDOMINAL HYSTERECTOMY    . APPENDECTOMY     3'13 [facility] s/p developed  Pulmonary Emboli  . BOTOX INJECTION     for migranes  . CATARACT EXTRACTION, BILATERAL Bilateral 03/2013   . CHOLECYSTECTOMY    . HAND SURGERY  2004   both hands  . KIDNEY STONE SURGERY    . KNEE ARTHROSCOPY  1998   right  . LAPAROSCOPIC APPENDECTOMY  11/27/2011   Procedure: APPENDECTOMY LAPAROSCOPIC;  Surgeon: Adin Hector, MD;  Location: [facility];  Service: General;  Laterality: N/A;  . SPINAL CORD STIMULATOR IMPLANT  06/04/2016   [facility]  . TOTAL KNEE ARTHROPLASTY Right 12/06/2014   Procedure: RIGHT TOTAL KNEE ARTHROPLASTY;  Surgeon: Gaynelle Arabian, MD;  Location: [facility];  Service: Orthopedics;  Laterality: Right;  . TOTAL KNEE REVISION Left 02/24/2014   Procedure: LEFT TOTAL KNEE REVISION;  Surgeon: Gearlean Alf, MD;  Location: [facility];  Service: Orthopedics;  Laterality: Left;  . vocal cord surgery  02-17-14   01-12-14 done in [place] [facility](s/p selective denervation/reinnervation recurrent laryngeal nerve surgery)  . zenkers diverticulum      FAMILY HISTORY Family History  Problem Relation Age of Onset  . Heart disease Mother   . Pneumonia Father   . Cirrhosis Father   . Hypertension Father   . Alcoholism Father   . Heart attack Paternal Grandfather   . Heart attack Maternal Grandfather   . Heart attack Paternal Grandmother   . Heart attack Maternal Grandmother     GYNECOLOGIC HISTORY:  Patient's last menstrual period was 09/18/1979 (approximate).  SOCIAL HISTORY: (Updated September 2020). 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  Timia currently lives by herself.  The patient has 2 children from an earlier marriage, Hayley [name], who lives in [place], and a daughter Hayley [name], who manages a  Building surveyor. The patient has one grandchild of her own, 55 with Arnell Sieving (who had 3 children from a prior marriage)     HEALTH MAINTENANCE: Social History   Tobacco Use  . Smoking status: Former Smoker    Quit date: 01/03/1982    Years since quitting: 37.4  . Smokeless tobacco: Never Used  Substance Use Topics  . Alcohol use: No  . Drug use: No     Colonoscopy:  PAP:  Bone density:  Lipid panel:  Allergies  Allergen Reactions  . Bee Venom Anaphylaxis  . Morphine And Related   . Tape Rash    Rash - only use paper tape  . Etodolac Diarrhea and Nausea And Vomiting  . Morphine Other (See Comments)    Hallucinations  . Other Itching and Swelling    Bee venom  . Poison Ivy Extract  [Poison Ivy Extract] Itching and Swelling  . Propoxyphene Nausea And Vomiting  . Propoxyphene N-Acetaminophen Nausea And Vomiting  . Penicillins Rash    Has patient had a  PCN reaction causing immediate rash, facial/tongue/throat swelling, SOB or lightheadedness with hypotension: Yes Has patient had a PCN reaction causing severe rash involving mucus membranes or skin necrosis: No Has patient had a PCN reaction that required hospitalization No Has patient had a PCN reaction occurring within the last 10 years: No If all of the above answers are "NO", then may proceed with Cephalosporin use.     Current Outpatient Medications  Medication Sig Dispense Refill  . apixaban (ELIQUIS) 2.5 MG TABS tablet Take 1 tablet (2.5 mg total) by mouth 2 (two) times daily. 180 tablet 4  . butalbital-acetaminophen-caffeine (FIORICET) 50-325-40 MG tablet Take 1 tablet by mouth every 6 (six) hours as needed for headache or migraine. 20 tablet 0  . cyanocobalamin (,VITAMIN B-12,) 1000 MCG/ML injection Inject 1 mL into the skin every 30 (thirty) days.  11  . diclofenac (FLECTOR) 1.3 % PTCH Place 1 patch onto the skin daily. 20 patch 1  . hydrOXYzine (ATARAX/VISTARIL) 25 MG tablet Take 1 tablet (25 mg total) by mouth 3  (three) times daily as needed for anxiety, itching or nausea. 30 tablet 0  . hyoscyamine (ANASPAZ) 0.125 MG TBDP disintergrating tablet Place 0.125 mg under the tongue every 4 (four) hours as needed for bladder spasms or cramping.     [name] levothyroxine (SYNTHROID, LEVOTHROID) 25 MCG tablet Take 25 mcg by mouth every morning.     . metoCLOPramide (REGLAN) 5 MG tablet Take 5 mg by mouth 3 (three) times daily as needed for nausea.    . mometasone-formoterol (DULERA) 100-5 MCG/ACT AERO Inhale 2 puffs into the lungs 2 (two) times daily as needed for wheezing or shortness of breath.     [name] MYRBETRIQ 50 MG TB24 tablet Take 50 mg by mouth at bedtime.     . OnabotulinumtoxinA (BOTOX IM) Inject 200 Units into the muscle every 3 (three) months.    . ondansetron (ZOFRAN-ODT) 8 MG disintegrating tablet Take 8 mg by mouth daily as needed for nausea.   5  . propranolol (INDERAL) 60 MG tablet Take 60 mg by mouth 2 (two) times daily.    . simethicone (MYLICON) 80 MG chewable tablet Chew 1 tablet (80 mg total) by mouth 4 (four) times daily as needed for flatulence (belching). 30 tablet 0  . sitaGLIPtin (JANUVIA) 25 MG tablet Take 25 mg by mouth daily.    . traZODone (DESYREL) 100 MG tablet Take 1/2 to one tab at bed time as needed (Patient taking differently: Take 100 mg by mouth at bedtime. Take 1/2 to one tab at bed time as needed) 90 tablet 0  . valACYclovir (VALTREX) 1000 MG tablet Take 2,000 mg by mouth 2 (two) times daily as needed (Fever blisters). Reported on 01/10/2016  2  . vortioxetine HBr (TRINTELLIX) 20 MG TABS tablet Take 1 tablet (20 mg total) by mouth daily. 90 tablet 0   No current facility-administered medications for this visit.     OBJECTIVE: Middle-aged white woman who appears stated age  79:   06/01/19 1055  BP: 137/62  Pulse: 77  Resp: 18  Temp: 98.7 F (37.1 C)  SpO2: 100%   Wt Readings from Last 3 Encounters:  06/01/19 209 lb 1.6 oz (94.8 kg)  05/04/19 206 lb 8 oz (93.7 kg)   04/05/19 207 lb 4.8 oz (94 kg)   Body mass index is 33.75 kg/m.  (Was 39.47 August 2017  ECOG FS:1 - Symptomatic but completely ambulatory  Ocular: Sclerae unicteric, pupils round and equal Ear-nose-throat:  Wearing a mask Lymphatic: No cervical or supraclavicular adenopathy Lungs no rales or rhonchi Heart regular rate and rhythm Abd soft, obese, nontender, positive bowel sounds MSK no focal spinal tenderness, no joint edema Neuro: non-focal, well-oriented, appropriate affect Breasts: Deferred    LAB RESULTS: CMP     Component Value Date/Time   NA 139 04/01/2019 0407   NA 138 12/12/2016 1105   K 3.6 04/01/2019 0407   K 4.6 12/12/2016 1105   CL 102 04/01/2019 0407   CO2 28 04/01/2019 0407   CO2 25 12/12/2016 1105   GLUCOSE 180 (H) 04/01/2019 0407   GLUCOSE 234 (H) 12/12/2016 1105   BUN 7 (L) 04/01/2019 0407   BUN 8.0 12/12/2016 1105   CREATININE 0.71 04/04/2019 0359   CREATININE 0.84 12/25/2017 1217   CREATININE 0.8 12/12/2016 1105   CALCIUM 8.7 (L) 04/01/2019 0407   CALCIUM 9.4 12/12/2016 1105   PROT 6.9 03/31/2019 1611   PROT 7.5 12/12/2016 1105   ALBUMIN 3.7 03/31/2019 1611   ALBUMIN 3.7 12/12/2016 1105   AST 15 03/31/2019 1611   AST 17 12/25/2017 1217   AST 14 12/12/2016 1105   ALT 8 03/31/2019 1611   ALT 14 12/25/2017 1217   ALT 16 12/12/2016 1105   ALKPHOS 54 03/31/2019 1611   ALKPHOS 102 12/12/2016 1105   BILITOT 0.8 03/31/2019 1611   BILITOT 0.3 12/25/2017 1217   BILITOT 0.34 12/12/2016 1105   GFRNONAA >60 04/04/2019 0359   GFRNONAA >60 12/25/2017 1217   GFRAA >60 04/04/2019 0359   GFRAA >60 12/25/2017 1217    INo results found for: SPEP, UPEP  Lab Results  Component Value Date   WBC 4.7 04/02/2019   NEUTROABS 2.4 04/01/2019   HGB 12.6 04/02/2019   HCT 41.0 04/02/2019   MCV 89.1 04/02/2019   PLT 211 04/02/2019      Chemistry      Component Value Date/Time   NA 139 04/01/2019 0407   NA 138 12/12/2016 1105   K 3.6 04/01/2019 0407   K  4.6 12/12/2016 1105   CL 102 04/01/2019 0407   CO2 28 04/01/2019 0407   CO2 25 12/12/2016 1105   BUN 7 (L) 04/01/2019 0407   BUN 8.0 12/12/2016 1105   CREATININE 0.71 04/04/2019 0359   CREATININE 0.84 12/25/2017 1217   CREATININE 0.8 12/12/2016 1105      Component Value Date/Time   CALCIUM 8.7 (L) 04/01/2019 0407   CALCIUM 9.4 12/12/2016 1105   ALKPHOS 54 03/31/2019 1611   ALKPHOS 102 12/12/2016 1105   AST 15 03/31/2019 1611   AST 17 12/25/2017 1217   AST 14 12/12/2016 1105   ALT 8 03/31/2019 1611   ALT 14 12/25/2017 1217   ALT 16 12/12/2016 1105   BILITOT 0.8 03/31/2019 1611   BILITOT 0.3 12/25/2017 1217   BILITOT 0.34 12/12/2016 1105       No results found for: LABCA2  No components found for: LABCA125  No results for input(s): INR in the last 168 hours.  Urinalysis    Component Value Date/Time   COLORURINE YELLOW 03/31/2019 1551   APPEARANCEUR CLEAR 03/31/2019 1551   APPEARANCEUR CLOUDY 12/19/2014 0730   LABSPEC 1.008 03/31/2019 1551   LABSPEC 1.025 12/19/2014 0730   PHURINE 7.0 03/31/2019 1551   GLUCOSEU NEGATIVE 03/31/2019 1551   GLUCOSEU see comment 12/19/2014 0730   HGBUR NEGATIVE 03/31/2019 [place] 03/31/2019 1551   BILIRUBINUR n 05/08/2016 1506   BILIRUBINUR see comment 12/19/2014 0730  KETONESUR NEGATIVE 03/31/2019 [place] 03/31/2019 1551   UROBILINOGEN negative 05/08/2016 1506   UROBILINOGEN 1.0 11/30/2014 1215   NITRITE NEGATIVE 03/31/2019 1551   LEUKOCYTESUR NEGATIVE 03/31/2019 1551   LEUKOCYTESUR see comment 12/19/2014 0730   STUDIES: No results found.  ASSESSMENT: 73 y.o. [place] woman with a history of recurrent PE as follows:  (1) multiple low-volume PE noted on chest CT/angio 12/06/2014, 10 days after laparoscopic appendectomy for a perforated appendix with phlegmon (a) bilateral LE dopplers 12/06/2014 negative (b) bilateral UE dopplers 12/07/2014 showed superficial R  cephalic v clot (c) treatment: warfarin x one year, monitored through [facility] (d) repeat CT angio 12/23/2015 showed prior PEs cleared; also CT/angio 05/15/2014 negative  (2) bilateral PEs documented by Ct/angio03/24/2016, day 3 post R total knee (a) started on rivaroxaban/ Xarelto, switched by PCP to dabigratan/ Pradaxa, discontinued May 2017 because of cost issues (b) repeat CT/angio 06/03/2015 shows resolution of prior PE  (c) started on apixaban 5 mg daily with symptoms suggestive of stroke, but negative workup, July 2017  (d) apixaban dose decreased to 2.5 mg daily given history of falls but persistently elevated d-dimer   (i) GFR > 60 04/01/2019  (3) hypercoagulable panel 06/05/2015 including a homocystine level, a screen for the lupus anticoagulant, beta 2 glycoprotein studies, anti-cardiolipin antibodies, and studies for the factor V Leiden and prothrombin gene mutation found no actionable abnormalities  (a) the anticardiolipin IgM antibody 06/05/2015 was 34 ("low medium positive"), repeat 07/18/2015 was 38  PLAN:  Grindle has had to clots, both "provoked", but both are gone to her lungs.  She continues to have risk factors for clotting including obesity.  She also has significant cardiovascular risk factors including diabetes.  She tolerates the apixaban well, and my recommendation is that she continue on it lifelong.  Once she runs out of her current supply, which is 5 mg, she will resume 2.5 mg twice daily and continue that indefinitely.  I have commended her weight loss.  Her goal is to get her BMI under 30, and her hemoglobin A1c is close to normal as she can.  She has an appointment with pulmonary at [facility].  I think that will be very helpful in her future management.  She is behind on mammography and I have rewritten that order, which Dr. Justin Mend had already written but the patient had a conflict so was not able to make  it.  From my point of view she will see me again in 1 year.  She knows to call for any other issue that may develop before then.   Aneth Schlagel, Virgie Dad, MD  06/01/19 11:19 AM Medical Oncology and Hematology [facility] [place], [place] [address] Tel. [phone]    Fax. [phone]  I, Jacqualyn Posey am acting as a Education administrator for Chauncey Cruel, MD.   I, Lurline Del MD, have reviewed the above documentation for accuracy and completeness, and I agree with the above.

## 2019-06-01 ENCOUNTER — Inpatient Hospital Stay: Payer: Medicare Other | Attending: Oncology | Admitting: Oncology

## 2019-06-01 ENCOUNTER — Other Ambulatory Visit: Payer: Self-pay

## 2019-06-01 DIAGNOSIS — Z7984 Long term (current) use of oral hypoglycemic drugs: Secondary | ICD-10-CM | POA: Diagnosis not present

## 2019-06-01 DIAGNOSIS — G4730 Sleep apnea, unspecified: Secondary | ICD-10-CM | POA: Diagnosis not present

## 2019-06-01 DIAGNOSIS — E119 Type 2 diabetes mellitus without complications: Secondary | ICD-10-CM | POA: Insufficient documentation

## 2019-06-01 DIAGNOSIS — Z7901 Long term (current) use of anticoagulants: Secondary | ICD-10-CM | POA: Diagnosis not present

## 2019-06-01 DIAGNOSIS — Z7951 Long term (current) use of inhaled steroids: Secondary | ICD-10-CM | POA: Insufficient documentation

## 2019-06-01 DIAGNOSIS — Z79899 Other long term (current) drug therapy: Secondary | ICD-10-CM | POA: Insufficient documentation

## 2019-06-01 DIAGNOSIS — K219 Gastro-esophageal reflux disease without esophagitis: Secondary | ICD-10-CM | POA: Insufficient documentation

## 2019-06-01 DIAGNOSIS — Z86718 Personal history of other venous thrombosis and embolism: Secondary | ICD-10-CM | POA: Insufficient documentation

## 2019-06-01 DIAGNOSIS — Z87891 Personal history of nicotine dependence: Secondary | ICD-10-CM | POA: Diagnosis not present

## 2019-06-01 DIAGNOSIS — Z86711 Personal history of pulmonary embolism: Secondary | ICD-10-CM | POA: Diagnosis not present

## 2019-06-01 DIAGNOSIS — E039 Hypothyroidism, unspecified: Secondary | ICD-10-CM | POA: Insufficient documentation

## 2019-06-01 DIAGNOSIS — D689 Coagulation defect, unspecified: Secondary | ICD-10-CM

## 2019-06-01 DIAGNOSIS — Z9989 Dependence on other enabling machines and devices: Secondary | ICD-10-CM | POA: Diagnosis not present

## 2019-06-01 MED ORDER — APIXABAN 2.5 MG PO TABS: 2.5000 mg | ORAL_TABLET | Freq: Two times a day (BID) | ORAL | 4 refills | Status: AC

## 2019-06-02 ENCOUNTER — Telehealth: Payer: Self-pay | Admitting: Oncology

## 2019-06-02 NOTE — Telephone Encounter (Signed)
I left a message regarding schedule  

## 2019-06-04 DIAGNOSIS — M5416 Radiculopathy, lumbar region: Secondary | ICD-10-CM | POA: Diagnosis not present

## 2019-06-08 DIAGNOSIS — Z9689 Presence of other specified functional implants: Secondary | ICD-10-CM | POA: Diagnosis not present

## 2019-06-08 DIAGNOSIS — G894 Chronic pain syndrome: Secondary | ICD-10-CM | POA: Diagnosis not present

## 2019-06-08 DIAGNOSIS — M5441 Lumbago with sciatica, right side: Secondary | ICD-10-CM | POA: Diagnosis not present

## 2019-06-08 DIAGNOSIS — M542 Cervicalgia: Secondary | ICD-10-CM | POA: Diagnosis not present

## 2019-06-08 DIAGNOSIS — Z5181 Encounter for therapeutic drug level monitoring: Secondary | ICD-10-CM | POA: Diagnosis not present

## 2019-06-08 DIAGNOSIS — G8929 Other chronic pain: Secondary | ICD-10-CM | POA: Diagnosis not present

## 2019-06-08 DIAGNOSIS — M5442 Lumbago with sciatica, left side: Secondary | ICD-10-CM | POA: Diagnosis not present

## 2019-06-08 DIAGNOSIS — Z79899 Other long term (current) drug therapy: Secondary | ICD-10-CM | POA: Diagnosis not present

## 2019-06-25 DIAGNOSIS — Z9981 Dependence on supplemental oxygen: Secondary | ICD-10-CM | POA: Diagnosis not present

## 2019-06-25 DIAGNOSIS — Z87891 Personal history of nicotine dependence: Secondary | ICD-10-CM | POA: Diagnosis not present

## 2019-06-25 DIAGNOSIS — J0110 Acute frontal sinusitis, unspecified: Secondary | ICD-10-CM | POA: Diagnosis not present

## 2019-06-25 DIAGNOSIS — G4733 Obstructive sleep apnea (adult) (pediatric): Secondary | ICD-10-CM | POA: Diagnosis not present

## 2019-06-25 DIAGNOSIS — R0902 Hypoxemia: Secondary | ICD-10-CM | POA: Diagnosis not present

## 2019-06-25 DIAGNOSIS — R0602 Shortness of breath: Secondary | ICD-10-CM | POA: Diagnosis not present

## 2019-06-25 DIAGNOSIS — J984 Other disorders of lung: Secondary | ICD-10-CM | POA: Diagnosis not present

## 2019-07-14 ENCOUNTER — Ambulatory Visit (HOSPITAL_COMMUNITY): Payer: Medicare Other | Admitting: Psychiatry

## 2019-07-14 DIAGNOSIS — I340 Nonrheumatic mitral (valve) insufficiency: Secondary | ICD-10-CM | POA: Diagnosis not present

## 2019-07-14 DIAGNOSIS — R1310 Dysphagia, unspecified: Secondary | ICD-10-CM | POA: Diagnosis not present

## 2019-07-14 DIAGNOSIS — R633 Feeding difficulties: Secondary | ICD-10-CM | POA: Diagnosis not present

## 2019-07-14 DIAGNOSIS — R42 Dizziness and giddiness: Secondary | ICD-10-CM | POA: Diagnosis not present

## 2019-07-14 DIAGNOSIS — R0902 Hypoxemia: Secondary | ICD-10-CM | POA: Diagnosis not present

## 2019-07-14 DIAGNOSIS — R0602 Shortness of breath: Secondary | ICD-10-CM | POA: Diagnosis not present

## 2019-07-14 DIAGNOSIS — I071 Rheumatic tricuspid insufficiency: Secondary | ICD-10-CM | POA: Diagnosis not present

## 2019-07-14 DIAGNOSIS — I371 Nonrheumatic pulmonary valve insufficiency: Secondary | ICD-10-CM | POA: Diagnosis not present

## 2019-07-14 DIAGNOSIS — H9201 Otalgia, right ear: Secondary | ICD-10-CM | POA: Diagnosis not present

## 2019-07-14 DIAGNOSIS — J984 Other disorders of lung: Secondary | ICD-10-CM | POA: Diagnosis not present

## 2019-07-14 DIAGNOSIS — R2689 Other abnormalities of gait and mobility: Secondary | ICD-10-CM | POA: Diagnosis not present

## 2019-07-14 DIAGNOSIS — R1312 Dysphagia, oropharyngeal phase: Secondary | ICD-10-CM | POA: Diagnosis not present

## 2019-07-14 DIAGNOSIS — J383 Other diseases of vocal cords: Secondary | ICD-10-CM | POA: Diagnosis not present

## 2019-07-20 ENCOUNTER — Ambulatory Visit: Payer: Medicare Other | Admitting: Neurology

## 2019-07-20 ENCOUNTER — Telehealth: Payer: Self-pay | Admitting: *Deleted

## 2019-07-20 NOTE — Telephone Encounter (Signed)
Pt called to cancel Botox appt same day due to sickness.

## 2019-07-21 ENCOUNTER — Ambulatory Visit (INDEPENDENT_AMBULATORY_CARE_PROVIDER_SITE_OTHER): Payer: Medicare Other | Admitting: Psychiatry

## 2019-07-21 ENCOUNTER — Other Ambulatory Visit: Payer: Self-pay

## 2019-07-21 DIAGNOSIS — F411 Generalized anxiety disorder: Secondary | ICD-10-CM | POA: Diagnosis not present

## 2019-07-21 DIAGNOSIS — F322 Major depressive disorder, single episode, severe without psychotic features: Secondary | ICD-10-CM

## 2019-07-21 MED ORDER — VORTIOXETINE HBR 20 MG PO TABS: 20.0000 mg | ORAL_TABLET | Freq: Every day | ORAL | 0 refills | Status: DC

## 2019-07-21 MED ORDER — TRAZODONE HCL 150 MG PO TABS: 150.0000 mg | ORAL_TABLET | Freq: Every day | ORAL | 0 refills | Status: DC

## 2019-07-21 NOTE — Progress Notes (Signed)
Virtual Visit via Telephone Note  I connected with Hayley [name] on 07/21/19 at  2:40 PM EST by telephone and verified that I am speaking with the correct person using two identifiers.   I discussed the limitations, risks, security and privacy concerns of performing an evaluation and management service by telephone and the availability of in person appointments. I also discussed with the patient that there may be a patient responsible charge related to this service. The patient expressed understanding and agreed to proceed.   History of Present Illness: Patient was evaluated through phone session.  She admitted to struggle with depression and anxiety.  Her biggest concern is tremors and lack of motivation to go outside.  She was seen neurology who recommended these tremors are psychogenic.  She is getting Botox for her headaches.  Recently her PCP started her on hydroxyzine but she is only taking 25 mg daily but recommended 3 times a day.  In the past she was reluctant to increase her Trintellix and trazodone but now she reported poor sleep and racing thoughts.  We have recommended IOP, group therapy but due to lack of transportation and tremor she has been not able to calm.  Patient lost her husband this March and she also have a lot of grief.  She admitted crying spells, sadness and especially the holidays she is feeling more withdrawn and isolated.  Though she denies any suicidal thoughts or homicidal thought but admitted anhedonia and sometimes hopelessness.  We have recommended to try hospice but she also did not contact them.  She talks to her daughter on and off.  Now she is willing to try higher dose of trazodone to get sleep.  We talked about limitation of psychotropic medication that these can cause worsening of the tremors.  We talked about [place] and she seems to be agree to discuss and explore if she may be a candidate for [place].  Patient denies any hallucination or any paranoia.   Past Psychiatric  History:Reviewed. H/Odepression and anxiety.H/Osuicidal attempt on Percocet in March 2019.TookEffexorin her 50sbut stopped after working for a while. Tried Zoloft, Paxil and Wellbutrin fromPCP but did not work. Did IOP,PHPand givenProzac butnoimprovement.Tried lexapro caused GI s/e.   Psychiatric Specialty Exam: Physical Exam  Review of Systems  Neurological: Positive for tremors.    Last menstrual period 09/18/1979.There is no height or weight on file to calculate BMI.  General Appearance: NA  Eye Contact:  NA  Speech:  Slow  Volume:  Decreased  Mood:  Anxious and Depressed  Affect:  NA  Thought Process:  Goal Directed  Orientation:  Full (Time, Place, and Person)  Thought Content:  Rumination  Suicidal Thoughts:  No  Homicidal Thoughts:  No  Memory:  Immediate;   Fair Recent;   Fair Remote;   Good  Judgement:  Good  Insight:  Good  Psychomotor Activity:  NA  Concentration:  Concentration: Fair and Attention Span: Fair  Recall:  Good  Fund of Knowledge:  Good  Language:  Fair  Akathisia:  No  Handed:  Right  AIMS (if indicated):     Assets:  Desire for Improvement Housing Resilience  ADL's:  Intact  Cognition:  WNL  Sleep:   poor      Assessment and Plan: Major depressive disorder, recurrent.  Generalized anxiety disorder.  Grief.  Patient admitted chronic depression and anxiety which is getting worse especially upcoming holidays.  She misses her husband a lot.  She is sleeping 3 to  4 hours.  I recommend to try trazodone 150 mg at bedtime and take the hydroxyzine more frequently if she feels very anxious prescribed by PCP.  Continue Trintellix 20 mg as patient does not want to increase the dose or try a different medication due to the fear of tremors.  We talked about [place] and she is interested for [place] evaluation.  We will contact love[employer]ia.  Who is a Warehouse manager for [employer].  Recommended to call us back if she has any questions or any worsening of  symptoms.  Follow-up in 6 weeks.  Follow Up Instructions:    I discussed the assessment and treatment plan with the patient. The patient was provided an opportunity to ask questions and all were answered. The patient agreed with the plan and demonstrated an understanding of the instructions.   The patient was advised to call back or seek an in-person evaluation if the symptoms worsen or if the condition fails to improve as anticipated.  I provided 30 minutes of non-face-to-face time during this encounter.   [name], MD

## 2019-08-05 DIAGNOSIS — Z23 Encounter for immunization: Secondary | ICD-10-CM | POA: Diagnosis not present

## 2019-08-06 ENCOUNTER — Other Ambulatory Visit: Payer: Self-pay

## 2019-08-06 ENCOUNTER — Ambulatory Visit (INDEPENDENT_AMBULATORY_CARE_PROVIDER_SITE_OTHER): Payer: Medicare Other | Admitting: Psychiatry

## 2019-08-06 DIAGNOSIS — F332 Major depressive disorder, recurrent severe without psychotic features: Secondary | ICD-10-CM | POA: Diagnosis not present

## 2019-08-06 NOTE — Progress Notes (Signed)
Psychiatric Initial Adult Assessment   Patient Identification: Hayley [name] MRN:  [id number] Date of Evaluation:  08/06/2019 Referral Source: Dr Adele Schilder Chief Complaint: I am depressed  Visit Diagnosis:    ICD-10-CM   1. Severe episode of recurrent major depressive disorder, without psychotic features ([place])  F33.2     History of Present Illness: Patient is a 73 year old female referred by her outpatient psychiatrist for evaluation for treatment of her depression with [place].  Patient reports that she has had history of depression since her 9s, adds that it has worsened over the past year or year and a half due to the death of her husband and that she has tried various medications over the years with no benefit.  Patient reports that she has side effects with medications, feels that [place] might help her depression to remit.  Patient states on a scale of 0-10 with 0 being no symptoms and 10 being the worst her depression stays mostly a 2 out of 10.  She adds that she tries to keeps herself busy but still struggles with day-to-day activities, self isolates, is tearful a lot, gets anxious at times, struggles with concentration, feels tired a lot, does not like going out of the house.  Patient denies any psychotic symptoms, any symptoms of PTSD, any symptoms of mania.  She also reports that her sleep is not restful, that she wakes up multiple times and at times has difficulty in falling asleep or staying asleep.  Patient denies any thoughts of self-harm but adds that she did overdose on Percocet in 2019 as she wanted to end her life and was hospitalized at that time  Patient reports that she has multiple stressors which include her step kids who do not want to stay in touch with her but adds that she has a son and a daughter who stay in touch with her.  She denies any thoughts of wanting to end her life at this time but states that she wants her depression to get better as she finds it overwhelming.  She  also reports that she sees a therapist along with a psychiatrist  Associated Signs/Symptoms: Depression Symptoms:  depressed mood, psychomotor retardation, fatigue, difficulty concentrating, hopelessness, loss of energy/fatigue, disturbed sleep, (Hypo) Manic Symptoms:  Distractibility, Anxiety Symptoms:  Excessive Worry, Obsessive Compulsive Symptoms:   None,, Psychotic Symptoms:  Hallucinations: None PTSD Symptoms: Negative  Past Psychiatric History: Sees Dr Adele Schilder at [facility] outpatient , has been tried on Zoloft, Paxil, Wellbutrin, Prozac, Effexor.   Previous Psychotropic Medications: Yes   Substance Abuse History in the last 12 months:  No.  Consequences of Substance Abuse: Negative  Past Medical History:  Past Medical History:  Diagnosis Date  . Anemia   . Anxiety   . Bronchitis    hx of   . Clotting disorder ([place]) 02-17-14   S/p appendectomy-developed Pulmonary emboli-tx. warfarin-d/c 1 yr ago.  . Colon polyps   . Depression   . Diabetes mellitus without complication ([place])    "borderline"- oral med  . Disorder of vocal cords    spasmotic dysphonia ,02-17-14 has" whispery voice-low tone"  . Hyperthyroidism   . Kidney stones   . Migraines   . Nausea Nov 15, 2014   cycle of migraine headaches and nausea  . Neuromuscular disorder ([place])    hands and throat"spasmodic dysphonia", tremors - [place]imb and forefinger   . Obesity, Class III, BMI 40-49.9 (morbid obesity) ([place]) 04/27/2010   02-17-14 reports some weight loss- intentional  .  Osteoarthritis    in back(chronic pain)  . Pelvic pain    02-17-14 "states thinks its back related on left groin"  . Pulmonary emboli [facility])    s/p Appendectomy '13 [facility]  . Reflux   . Right knee pain   . Sleep apnea    cpap - settings at 4   . Toxic goiter     Past Surgical History:  Procedure Laterality Date  . ABDOMINAL HYSTERECTOMY    . APPENDECTOMY     3'13 [facility] s/p developed  Pulmonary Emboli  . BOTOX INJECTION     for migranes  .  CATARACT EXTRACTION, BILATERAL Bilateral 03/2013   . CHOLECYSTECTOMY    . HAND SURGERY  2004   both hands  . KIDNEY STONE SURGERY    . KNEE ARTHROSCOPY  1998   right  . LAPAROSCOPIC APPENDECTOMY  11/27/2011   Procedure: APPENDECTOMY LAPAROSCOPIC;  Surgeon: Adin Hector, MD;  Location: [facility] ORS;  Service: General;  Laterality: N/A;  . SPINAL CORD STIMULATOR IMPLANT  06/04/2016   Dr. Alfonse Ras at [facility]  . TOTAL KNEE ARTHROPLASTY Right 12/06/2014   Procedure: RIGHT TOTAL KNEE ARTHROPLASTY;  Surgeon: Gaynelle Arabian, MD;  Location: [facility]S;  Service: Orthopedics;  Laterality: Right;  . TOTAL KNEE REVISION Left 02/24/2014   Procedure: LEFT TOTAL KNEE REVISION;  Surgeon: Gearlean Alf, MD;  Location: [facility];  Service: Orthopedics;  Laterality: Left;  . vocal cord surgery  02-17-14   01-12-14 done in [place] [facility](s/p selective denervation/reinnervation recurrent laryngeal nerve surgery)  . zenkers diverticulum      Family Psychiatric History: Patient   Family History:  Family History  Problem Relation Age of Onset  . Heart disease Mother   . Pneumonia Father   . Cirrhosis Father   . Hypertension Father   . Alcoholism Father   . Heart attack Paternal Grandfather   . Heart attack Maternal Grandfather   . Heart attack Paternal Grandmother   . Heart attack Maternal Grandmother     Social History:   Social History   Socioeconomic History  . Marital status: Married    Spouse name: Not on file  . Number of children: 5  . Years of education: 40  . Highest education level: Not on file  Occupational History  . Occupation: Retired  Scientific laboratory technician  . Financial resource strain: Somewhat hard  . Food insecurity    Worry: Sometimes true    Inability: Sometimes true  . Transportation needs    Medical: Yes    Non-medical: Yes  Tobacco Use  . Smoking status: Former Smoker    Quit date: 01/03/1982    Years since quitting: 37.6  . Smokeless tobacco: Never Used  Substance  and Sexual Activity  . Alcohol use: No  . Drug use: No  . Sexual activity: Not Currently  Lifestyle  . Physical activity    Days per week: 0 days    Minutes per session: 0 min  . Stress: Very much  Relationships  . Social Herbalist on phone: Once a week    Gets together: Once a week    Attends religious service: More than 4 times per year    Active member of club or organization: No    Attends meetings of clubs or organizations: Never    Relationship status: Separated  Other Topics Concern  . Not on file  Social History Narrative   Lives at home alone at this time.  Right-handed.   2 cups caffeine per day.    Additional Social History:   Allergies:   Allergies  Allergen Reactions  . Bee Venom Anaphylaxis  . Morphine And Related   . Tape Rash    Rash - only use paper tape  . Etodolac Diarrhea and Nausea And Vomiting  . Morphine Other (See Comments)    Hallucinations  . Other Itching and Swelling    Bee venom  . Poison Ivy Extract  [Poison Ivy Extract] Itching and Swelling  . Propoxyphene Nausea And Vomiting  . Propoxyphene N-Acetaminophen Nausea And Vomiting  . Penicillins Rash    Has patient had a PCN reaction causing immediate rash, facial/tongue/throat swelling, SOB or lightheadedness with hypotension: Yes Has patient had a PCN reaction causing severe rash involving mucus membranes or skin necrosis: No Has patient had a PCN reaction that required hospitalization No Has patient had a PCN reaction occurring within the last 10 years: No If all of the above answers are "NO", then may proceed with Cephalosporin use.     Metabolic Disorder Labs: Lab Results  Component Value Date   HGBA1C 6.4 (H) 04/02/2019   MPG 136.98 04/02/2019   MPG 157 03/23/2016   No results found for: PROLACTIN Lab Results  Component Value Date   CHOL 159 03/23/2016   TRIG 122 11/17/2017   HDL 31 (L) 03/23/2016   CHOLHDL 5.1 03/23/2016   VLDL 35 03/23/2016   LDLCALC 93  03/23/2016   Lab Results  Component Value Date   TSH 1.348 11/17/2017    Therapeutic Level Labs: No results found for: LITHIUM No results found for: CBMZ No results found for: VALPROATE  Current Medications: Current Outpatient Medications  Medication Sig Dispense Refill  . apixaban (ELIQUIS) 2.5 MG TABS tablet Take 1 tablet (2.5 mg total) by mouth 2 (two) times daily. 180 tablet 4  . butalbital-acetaminophen-caffeine (FIORICET) 50-325-40 MG tablet Take 1 tablet by mouth every 6 (six) hours as needed for headache or migraine. 20 tablet 0  . cyanocobalamin (,VITAMIN B-12,) 1000 MCG/ML injection Inject 1 mL into the skin every 30 (thirty) days.  11  . diclofenac (FLECTOR) 1.3 % PTCH Place 1 patch onto the skin daily. 20 patch 1  . hydrOXYzine (ATARAX/VISTARIL) 25 MG tablet Take 1 tablet (25 mg total) by mouth 3 (three) times daily as needed for anxiety, itching or nausea. 30 tablet 0  . hyoscyamine (ANASPAZ) 0.125 MG TBDP disintergrating tablet Place 0.125 mg under the tongue every 4 (four) hours as needed for bladder spasms or cramping.     [name] levothyroxine (SYNTHROID, LEVOTHROID) 25 MCG tablet Take 25 mcg by mouth every morning.     . metoCLOPramide (REGLAN) 5 MG tablet Take 5 mg by mouth 3 (three) times daily as needed for nausea.    . mometasone-formoterol (DULERA) 100-5 MCG/ACT AERO Inhale 2 puffs into the lungs 2 (two) times daily as needed for wheezing or shortness of breath.     [name] MYRBETRIQ 50 MG TB24 tablet Take 50 mg by mouth at bedtime.     . OnabotulinumtoxinA (BOTOX IM) Inject 200 Units into the muscle every 3 (three) months.    . ondansetron (ZOFRAN-ODT) 8 MG disintegrating tablet Take 8 mg by mouth daily as needed for nausea.   5  . propranolol (INDERAL) 60 MG tablet Take 60 mg by mouth 2 (two) times daily.    . simethicone (MYLICON) 80 MG chewable tablet Chew 1 tablet (80 mg total) by mouth 4 (four) times  daily as needed for flatulence (belching). 30 tablet 0  . sitaGLIPtin  (JANUVIA) 25 MG tablet Take 25 mg by mouth daily.    . traZODone (DESYREL) 150 MG tablet Take 1 tablet (150 mg total) by mouth at bedtime. 90 tablet 0  . valACYclovir (VALTREX) 1000 MG tablet Take 2,000 mg by mouth 2 (two) times daily as needed (Fever blisters). Reported on 01/10/2016  2  . vortioxetine HBr (TRINTELLIX) 20 MG TABS tablet Take 1 tablet (20 mg total) by mouth daily. 90 tablet 0   No current facility-administered medications for this visit.     Musculoskeletal: Strength & Muscle Tone: within normal limits Gait & Station: normal Patient leans: N/A  Psychiatric Specialty Exam: ROS  Last menstrual period 09/18/1979.There is no height or weight on file to calculate BMI.  General Appearance: Casual  Eye Contact:  Fair  Speech:  Clear and Coherent and Normal Rate  Volume:  Decreased  Mood:  Anxious, Depressed, Dysphoric, Hopeless and Worthless  Affect:  Congruent and Depressed  Thought Process:  Coherent, Linear and Descriptions of Associations: Intact  Orientation:  Full (Time, Place, and Person)  Thought Content:  Logical and Rumination  Suicidal Thoughts:  No  Homicidal Thoughts:  No  Memory:  Immediate;   Fair Recent;   Fair Remote;   Fair  Judgement:  Intact  Insight:  Present  Psychomotor Activity:  Psychomotor Retardation  Concentration:  Concentration: Fair and Attention Span: Fair  Recall:  [employer] of Knowledge:Fair  Language: Fair  Akathisia:  No  Handed:  Right  AIMS (if indicated):  not done  Assets:  Desire for Improvement Financial Resources/Insurance Housing Leisure Time Social Support Transportation  ADL's:  Intact  Cognition: WNL  Sleep:  Poor   Screenings: PHQ2-9     Office Visit from 08/06/2019 in [facility] Patient Outreach Telephone from 04/06/2019 in [facility]  PHQ-2 Total Score  6  3  PHQ-9 Total Score  24  7      Assessment and Plan: Major depressive disorder, severe,  recurrent without psychotic features Patient has been tried on various psychotropic medications without success, continues to feel depressed, is seeing outpatient psychiatry, has not had a good response to medications and would benefit from [place].  Patient does also struggle with grief, loss of her spouse, does have social supports in the community, has no active plans to hurt herself, but does state if her depression does not get better she wishes that she would not wake up.  Patient is a good candidate for [place]  Crisis and safety planning discussed in length with patient, patient states that she has no plans of hurting herself, reports her kids are supportive even though her son lives in [place], she has that she talks to them regularly and her daughter is local   50% of this visit was spent in discussing psychotropic medications, [place] and ECT, this was a 40-minute appointment.  This is a safety planning was also discussed in length at this visit the patient has no suicidal thoughts currently but has had a serious attempt in the past, struggles with her husband's death and with severe depression.  Hampton Abbot, MD 11/19/20201:10 PM

## 2019-08-19 ENCOUNTER — Ambulatory Visit (INDEPENDENT_AMBULATORY_CARE_PROVIDER_SITE_OTHER): Payer: Medicare Other | Admitting: Psychiatry

## 2019-08-19 ENCOUNTER — Other Ambulatory Visit: Payer: Self-pay

## 2019-08-19 DIAGNOSIS — F332 Major depressive disorder, recurrent severe without psychotic features: Secondary | ICD-10-CM | POA: Diagnosis not present

## 2019-08-19 NOTE — Progress Notes (Signed)
Pt reported to [facility] for cortical mapping and motor threshold determination for Repetitive Transcranial Magnetic Stimulation treatment for Major Depressive Disorder. Pt completed a PHQ-9 with a score of 24(severe depression). Pt also completed a Beck's Depression Inventory with a score of 33(severe depression). Prior to procedure, pt signed an informed consent agreement for [place] treatment. Pt's treatment area was found by applying single pulses to her left motor cortex, hunting along the anterior/posterior plane and along the superior oblique angle until the best motor response was elicited from the pt's right thumb. The best response was observed at 5.0 cmA/P and 30degrees SOA, with a coil angle of 0degrees. Pt's motor threshold was calculated using the [employer]'s proprietary MT Assist algorithm, which produced a calculated motor threshold of 1.00SMT. Per these findings, pt's treatment parameters are as follows: A/P -- 10.5cm, SOA -- 30degrees, Coil Angle -- 0degrees, Motor Threshold -- 1.08SMT. With these parameters, the pt will receive 36 sessions of TMS according to the following protocol: 3000 pulses per session, with stimulation in bursts of pulses lasting 4 seconds at a frequency of 10 Hz, separated by 26 seconds of rest. After determining pt's tx parameters, coil was moved to the treatment location, and the first burst of pulses was applied at a reduced power of 80%MT. Pt reported no complaints, and stated that the stimulation was tolerable. Upon completion of mapping, pt completed a few treatment intervals for observation of side effects. Pt tolerated tx well. Pt departed from clinic without issue.

## 2019-08-20 ENCOUNTER — Other Ambulatory Visit: Payer: Self-pay

## 2019-08-20 ENCOUNTER — Other Ambulatory Visit (HOSPITAL_COMMUNITY): Payer: Medicare Other | Attending: Psychiatry | Admitting: Emergency Medicine

## 2019-08-20 DIAGNOSIS — F332 Major depressive disorder, recurrent severe without psychotic features: Secondary | ICD-10-CM | POA: Insufficient documentation

## 2019-08-20 NOTE — Progress Notes (Signed)
Patient reported to [facility] for Repetitive Transcranial Magnetic Stimulation treatment for severe episode of recurrent major depressive disorder, without psychotic features. Patient presented with appropriate affect, level mood and denied any suicidal or homicidal ideations. Patient denies any other current symptoms and remains optimistic with continued [place] treatment. Patient reported no change in alcohol/substance use, caffeine consumption, sleep pattern or metal implant status since previous tx. Patient reported to tx session with pleasant affect.She watched TV. She spoke with Probation officer.Powerwas titrated to120% for the duration of txand will remain there until patient gets adjusted. Patient reported no complaints or discomfort. Patientdeparted post-treatment with no concerns or complaints.

## 2019-08-21 ENCOUNTER — Encounter (HOSPITAL_COMMUNITY): Payer: Medicare Other | Admitting: Emergency Medicine

## 2019-08-24 ENCOUNTER — Other Ambulatory Visit (INDEPENDENT_AMBULATORY_CARE_PROVIDER_SITE_OTHER): Payer: Medicare Other | Admitting: Emergency Medicine

## 2019-08-24 ENCOUNTER — Other Ambulatory Visit: Payer: Self-pay

## 2019-08-24 DIAGNOSIS — F332 Major depressive disorder, recurrent severe without psychotic features: Secondary | ICD-10-CM

## 2019-08-24 NOTE — Progress Notes (Signed)
Patient reported to [facility] for Repetitive Transcranial Magnetic Stimulation treatment for severe episode of recurrent major depressive disorder, without psychotic features. Patient presented with appropriate affect, level mood and denied any suicidal or homicidal ideations. Patient denies any other current symptoms and remains optimistic with continued [place] treatment. Patient reported no change in alcohol/substance use, caffeine consumption, sleep pattern or metal implant status since previous tx. Patient reported to tx session with pleasant affect.She shared that she has been experiencing mouth pain. She watched TV. She spoke with Probation officer.Powerwas titrated to120% for the duration of txand will remain there until patient gets adjusted. Patient reported no complaints or discomfort. Patientdeparted post-treatment with no concerns or complaints.

## 2019-08-25 ENCOUNTER — Other Ambulatory Visit: Payer: Self-pay

## 2019-08-25 ENCOUNTER — Other Ambulatory Visit (INDEPENDENT_AMBULATORY_CARE_PROVIDER_SITE_OTHER): Payer: Medicare Other | Admitting: Emergency Medicine

## 2019-08-25 DIAGNOSIS — F332 Major depressive disorder, recurrent severe without psychotic features: Secondary | ICD-10-CM

## 2019-08-25 NOTE — Progress Notes (Signed)
Patient reported to [facility] for Repetitive Transcranial Magnetic Stimulation treatment for severe episode of recurrent major depressive disorder, without psychotic features. Patient presented with appropriate affect, level mood and denied any suicidal or homicidal ideations. Patient denies any other current symptoms and remains optimistic with continued [place] treatment. Patient reported no change in alcohol/substance use, caffeine consumption, sleep pattern or metal implant status since previous tx. Patient reported to tx session with pleasant affect.She shared that she has been experiencing mouth pain. She watched TV.She spoke with Probation officer.Powerwas titrated to120% for the duration of txand will remain there until patient gets adjusted. Patient reported no complaints or discomfort. Patientdeparted post-treatment with no concerns or complaints.

## 2019-08-26 ENCOUNTER — Encounter (HOSPITAL_COMMUNITY): Payer: Medicare Other | Admitting: Emergency Medicine

## 2019-08-27 ENCOUNTER — Other Ambulatory Visit (INDEPENDENT_AMBULATORY_CARE_PROVIDER_SITE_OTHER): Payer: Medicare Other | Admitting: Emergency Medicine

## 2019-08-27 ENCOUNTER — Other Ambulatory Visit: Payer: Self-pay

## 2019-08-27 DIAGNOSIS — F332 Major depressive disorder, recurrent severe without psychotic features: Secondary | ICD-10-CM

## 2019-08-27 NOTE — Progress Notes (Signed)
Patient reported to [facility] for Repetitive Transcranial Magnetic Stimulation treatment for severe episode of recurrent major depressive disorder, without psychotic features. Patient presented with appropriate affect, level mood and denied any suicidal or homicidal ideations. Patient denies any other current symptoms and remains optimistic with continued [place] treatment. Patient reported no change in alcohol/substance use, caffeine consumption, sleep pattern or metal implant status since previous tx. Patient reported to tx session with pleasant affect.She shared that she has been experiencing mouth pain.She watched TV.She spoke with Probation officer.Pt shared that her anxiety was severe. She was unable to get out of bed and had to miss [place] appointment yesterday. However, she feels much better today. Powerwas titrated to120% for the duration of txand will remain there until patient gets adjusted. Patient reported no complaints or discomfort. Patientdeparted post-treatment with no concerns or complaints.

## 2019-08-28 ENCOUNTER — Other Ambulatory Visit: Payer: Self-pay

## 2019-08-28 ENCOUNTER — Other Ambulatory Visit (INDEPENDENT_AMBULATORY_CARE_PROVIDER_SITE_OTHER): Payer: Medicare Other | Admitting: Emergency Medicine

## 2019-08-28 DIAGNOSIS — F332 Major depressive disorder, recurrent severe without psychotic features: Secondary | ICD-10-CM

## 2019-08-28 NOTE — Progress Notes (Signed)
Patient reported to [facility] for Repetitive Transcranial Magnetic Stimulation treatment for severe episode of recurrent major depressive disorder, without psychotic features. Patient presented with appropriate affect, level mood and denied any suicidal or homicidal ideations. Patient denies any other current symptoms and remains optimistic with continued [place] treatment. Patient reported no change in alcohol/substance use, caffeine consumption, sleep pattern or metal implant status since previous tx. Patient reported to tx session with pleasant affect.Pt created short term and long term goal with Probation officer. She is motivated to complete the goals. She watched TV.She spoke with Probation officer.Powerwas titrated to120% for the duration of txand will remain there until patient gets adjusted. Patient reported no complaints or discomfort. Patientdeparted post-treatment with no concerns or complaints.

## 2019-08-31 ENCOUNTER — Other Ambulatory Visit: Payer: Self-pay

## 2019-08-31 ENCOUNTER — Other Ambulatory Visit (INDEPENDENT_AMBULATORY_CARE_PROVIDER_SITE_OTHER): Payer: Medicare Other | Admitting: Emergency Medicine

## 2019-08-31 DIAGNOSIS — F332 Major depressive disorder, recurrent severe without psychotic features: Secondary | ICD-10-CM

## 2019-08-31 NOTE — Progress Notes (Signed)
Patient reported to [facility] for Repetitive Transcranial Magnetic Stimulation treatment for severe episode of recurrent major depressive disorder, without psychotic features. Patient presented with appropriate affect, level mood and denied any suicidal or homicidal ideations. Patient denies any other current symptoms and remains optimistic with continued [place] treatment. Patient reported no change in alcohol/substance use, caffeine consumption, sleep pattern or metal implant status since previous tx. Patient reported to tx session with pleasant affect.She shared that she has been experiencing mouth pain.She watched TV.She spoke with Probation officer.Powerwas titrated to120% for the duration of txand will remain there until patient gets adjusted. Patient reported no complaints or discomfort. Patientdeparted post-treatment with no concerns or complaints.

## 2019-09-01 ENCOUNTER — Encounter (HOSPITAL_COMMUNITY): Payer: Medicare Other | Admitting: Emergency Medicine

## 2019-09-02 ENCOUNTER — Encounter (HOSPITAL_COMMUNITY): Payer: Medicare Other | Admitting: Emergency Medicine

## 2019-09-03 ENCOUNTER — Ambulatory Visit (INDEPENDENT_AMBULATORY_CARE_PROVIDER_SITE_OTHER): Payer: Medicare Other | Admitting: Psychiatry

## 2019-09-03 ENCOUNTER — Other Ambulatory Visit: Payer: Self-pay

## 2019-09-03 ENCOUNTER — Encounter (HOSPITAL_COMMUNITY): Payer: Self-pay | Admitting: Psychiatry

## 2019-09-03 ENCOUNTER — Encounter (HOSPITAL_COMMUNITY): Payer: Medicare Other | Admitting: Emergency Medicine

## 2019-09-03 DIAGNOSIS — F4321 Adjustment disorder with depressed mood: Secondary | ICD-10-CM

## 2019-09-03 DIAGNOSIS — F411 Generalized anxiety disorder: Secondary | ICD-10-CM | POA: Diagnosis not present

## 2019-09-03 DIAGNOSIS — F322 Major depressive disorder, single episode, severe without psychotic features: Secondary | ICD-10-CM

## 2019-09-03 MED ORDER — VORTIOXETINE HBR 20 MG PO TABS: 20.0000 mg | ORAL_TABLET | Freq: Every day | ORAL | 0 refills | Status: DC

## 2019-09-03 MED ORDER — TRAZODONE HCL 100 MG PO TABS: 100.0000 mg | ORAL_TABLET | Freq: Every day | ORAL | 0 refills | Status: DC

## 2019-09-03 NOTE — Progress Notes (Signed)
Virtual Visit via Telephone Note  I connected with INDERPREET RIDENS on 09/03/19 at  1:40 PM EST by telephone and verified that I am speaking with the correct person using two identifiers.   I discussed the limitations, risks, security and privacy concerns of performing an evaluation and management service by telephone and the availability of in person appointments. I also discussed with the patient that there may be a patient responsible charge related to this service. The patient expressed understanding and agreed to proceed.   History of Present Illness: Patient was evaluated through phone session.  She is now getting [place] treatment and she noticed some improvement in her mood.  She is has more energy.  She is sleeping better.  She went back to previous dose of trazodone 100 mg.  She reported that she is more hopeful and she decided to put her tree and she is happy about it.  She also keeps the family physician and get plasma and blood work but she thought that she will not do it.  She got invited from her son who lives in [place] for Christmas but due to Covid she had decided not to go.  Her daughter and her husband lives close by but they are busy.  She still misses her husband a lot but now slowly getting gradually coping better.  At some point she like to come off from her psychotropic medication however I explained that she should continue for now as she is getting better and keep the [place] treatment as she has shown improvement.  She still have times lack of appetite but denies any weight change or weight loss.  She denies any suicidal thoughts or any crying spells but goes into sadness specially holidays.  She told Thanksgiving was a difficult day because that was her husband's favorite holiday.  She has no tremors, shakes or any EPS.  She denies any hallucination, paranoia.  Past Psychiatric History:Reviewed. H/Odepression and anxiety.H/Osuicidal attempt on Percocet in March  2019.TookEffexorin her 50sbut stopped after working for a while. Tried Zoloft, Paxil and Wellbutrin fromPCP but did not work. Did IOP,PHPand givenProzac butnoimprovement.Tried lexapro caused GI s/e.    Psychiatric Specialty Exam: Physical Exam  Review of Systems  Last menstrual period 09/18/1979.There is no height or weight on file to calculate BMI.  General Appearance: NA  Eye Contact:  NA  Speech:  Slow  Volume:  Decreased  Mood:  Dysphoric  Affect:  NA  Thought Process:  Goal Directed  Orientation:  Full (Time, Place, and Person)  Thought Content:  Rumination  Suicidal Thoughts:  No  Homicidal Thoughts:  No  Memory:  Immediate;   Good Recent;   Good Remote;   Good  Judgement:  Intact  Insight:  Present  Psychomotor Activity:  NA  Concentration:  Concentration: Fair and Attention Span: Fair  Recall:  Good  Fund of Knowledge:  Good  Language:  Good  Akathisia:  No  Handed:  Right  AIMS (if indicated):     Assets:  Communication Skills Desire for Improvement Housing Resilience  ADL's:  Intact  Cognition:  WNL  Sleep:   better      Assessment and Plan: Major depressive disorder, recurrent.  Generalized anxiety disorder.  Grief.  Patient shown some improvement since started [place].  I recommend that continue TMS and continue Trintellix 20 mg.  However she like to cut down her trazodone back to 1 mg as she is sleeping much better with the current dose.  She agreed to continue [place] since she noticed improvement.  We will consider cutting down the dose in the future as she like to wean her self off of the medication.  I recommended to call us back if she is any question, concern.  Feeling worsening of the symptom.  She thinks that she can afford Trintellix since her insurance is changed.  However I reminded that if she cannot afford Trintellix then give [place] a call so we could provide samples.  Follow-up in 3 months this time.  Follow Up Instructions:    I  discussed the assessment and treatment plan with the patient. The patient was provided an opportunity to ask questions and all were answered. The patient agreed with the plan and demonstrated an understanding of the instructions.   The patient was advised to call back or seek an in-person evaluation if the symptoms worsen or if the condition fails to improve as anticipated.  I provided 20 minutes of non-face-to-face time during this encounter.   [name], MD

## 2019-09-04 ENCOUNTER — Other Ambulatory Visit (INDEPENDENT_AMBULATORY_CARE_PROVIDER_SITE_OTHER): Payer: Medicare Other | Admitting: Emergency Medicine

## 2019-09-04 ENCOUNTER — Other Ambulatory Visit: Payer: Self-pay

## 2019-09-04 DIAGNOSIS — F332 Major depressive disorder, recurrent severe without psychotic features: Secondary | ICD-10-CM

## 2019-09-04 NOTE — Progress Notes (Signed)
Patient reported to [facility] for Repetitive Transcranial Magnetic Stimulation treatment for severe episode of recurrent major depressive disorder, without psychotic features. Patient presented with appropriate affect, level mood and denied any suicidal or homicidal ideations. Patient denies any other current symptoms and remains optimistic with continued [place] treatment. Patient reported no change in alcohol/substance use, caffeine consumption, sleep pattern or metal implant status since previous tx. Patient reported to tx session with pleasant affect.She shared that she has been noticing improvement in her mood. She spent time with her friends yesterday evening and enjoyed their company.She watched TV.She spoke with Probation officer.Powerwas titrated to120% for the duration of txand will remain there until patient gets adjusted. Patient reported no complaints or discomfort. Patientdeparted post-treatment with no concerns or complaints.

## 2019-09-07 ENCOUNTER — Other Ambulatory Visit: Payer: Self-pay

## 2019-09-07 ENCOUNTER — Other Ambulatory Visit (INDEPENDENT_AMBULATORY_CARE_PROVIDER_SITE_OTHER): Payer: Medicare Other | Admitting: Emergency Medicine

## 2019-09-07 DIAGNOSIS — F332 Major depressive disorder, recurrent severe without psychotic features: Secondary | ICD-10-CM

## 2019-09-07 NOTE — Progress Notes (Signed)
Patient reported to [facility] for Repetitive Transcranial Magnetic Stimulation treatment for severe episode of recurrent major depressive disorder, without psychotic features. Patient presented with appropriate affect, level mood and denied any suicidal or homicidal ideations. Patient denies any other current symptoms and remains optimistic with continued [place] treatment. Patient reported no change in alcohol/substance use, caffeine consumption, sleep pattern or metal implant status since previous tx. Patient reported to tx session with pleasant affect.She stated that she had a great weekend. She went and go a haircut and completed errands. She got was titrated to120% for the duration of txand will remain there until patient gets adjusted. Patient reported no complaints or discomfort. Patientdeparted post-treatment with no concerns or complaints.

## 2019-09-08 ENCOUNTER — Other Ambulatory Visit: Payer: Self-pay

## 2019-09-08 ENCOUNTER — Other Ambulatory Visit (INDEPENDENT_AMBULATORY_CARE_PROVIDER_SITE_OTHER): Payer: Medicare Other | Admitting: Emergency Medicine

## 2019-09-08 DIAGNOSIS — F332 Major depressive disorder, recurrent severe without psychotic features: Secondary | ICD-10-CM

## 2019-09-08 NOTE — Progress Notes (Signed)
Patient reported to [facility] for Repetitive Transcranial Magnetic Stimulation treatment for severe episode of recurrent major depressive disorder, without psychotic features. Patient presented with appropriate affect, level mood and denied any suicidal or homicidal ideations. Patient denies any other current symptoms and remains optimistic with continued [place] treatment. Patient reported no change in alcohol/substance use, caffeine consumption, sleep pattern or metal implant status since previous tx. Patient reported to tx session with pleasant affect.Shewas 10 minutes late to appointment. She shared that she has been up since 5am with her dogs and getting things done around the house. She watched TV during tx session. She got was titrated to120% for the duration of txand will remain there until patient gets adjusted. Patient reported no complaints or discomfort. Patientdeparted post-treatment with no concerns or complaints.

## 2019-09-09 ENCOUNTER — Other Ambulatory Visit (INDEPENDENT_AMBULATORY_CARE_PROVIDER_SITE_OTHER): Payer: Medicare Other | Admitting: Emergency Medicine

## 2019-09-09 ENCOUNTER — Other Ambulatory Visit: Payer: Self-pay

## 2019-09-09 DIAGNOSIS — F332 Major depressive disorder, recurrent severe without psychotic features: Secondary | ICD-10-CM

## 2019-09-09 NOTE — Progress Notes (Signed)
Patient reported to [facility] for Repetitive Transcranial Magnetic Stimulation treatment for severe episode of recurrent major depressive disorder, without psychotic features. Patient presented with appropriate affect, level mood and denied any suicidal or homicidal ideations. Patient denies any other current symptoms and remains optimistic with continued [place] treatment. Patient reported no change in alcohol/substance use, caffeine consumption, sleep pattern or metal implant status since previous tx. Patient reported to tx session with pleasant affect.She shared that she has a lot of energy and seems to not be able to slow down. She has been enjoying the improvement from [place]. She got was titrated to120% for the duration of txand will remain there until patient gets adjusted. Patient reported no complaints or discomfort. Patientdeparted post-treatment with no concerns or complaints.

## 2019-09-10 ENCOUNTER — Encounter (HOSPITAL_COMMUNITY): Payer: Medicare Other

## 2019-09-14 ENCOUNTER — Encounter (HOSPITAL_COMMUNITY): Payer: Medicare Other | Admitting: Emergency Medicine

## 2019-09-15 ENCOUNTER — Encounter (HOSPITAL_COMMUNITY): Payer: Medicare Other | Admitting: Emergency Medicine

## 2019-09-16 ENCOUNTER — Encounter (HOSPITAL_COMMUNITY): Payer: Medicare Other

## 2019-09-16 DIAGNOSIS — Z20828 Contact with and (suspected) exposure to other viral communicable diseases: Secondary | ICD-10-CM | POA: Diagnosis not present

## 2019-09-16 DIAGNOSIS — Z03818 Encounter for observation for suspected exposure to other biological agents ruled out: Secondary | ICD-10-CM | POA: Diagnosis not present

## 2019-09-17 ENCOUNTER — Encounter (HOSPITAL_COMMUNITY): Payer: Medicare Other

## 2019-09-21 ENCOUNTER — Telehealth: Payer: Self-pay | Admitting: *Deleted

## 2019-09-21 ENCOUNTER — Ambulatory Visit: Payer: Medicare Other | Admitting: Neurology

## 2019-09-21 NOTE — Telephone Encounter (Signed)
The patient arrived 25 minutes late to her appt.  Discussed with Dr. [name] and the patient was rescheduled.

## 2019-09-21 NOTE — Telephone Encounter (Signed)
No showed Botox appointment. 

## 2019-10-03 DIAGNOSIS — M5416 Radiculopathy, lumbar region: Secondary | ICD-10-CM | POA: Diagnosis not present

## 2019-10-21 ENCOUNTER — Other Ambulatory Visit (HOSPITAL_COMMUNITY): Payer: Self-pay | Admitting: Psychiatry

## 2019-10-21 DIAGNOSIS — F322 Major depressive disorder, single episode, severe without psychotic features: Secondary | ICD-10-CM

## 2019-10-26 ENCOUNTER — Other Ambulatory Visit: Payer: Self-pay

## 2019-10-26 ENCOUNTER — Encounter: Payer: Self-pay | Admitting: Neurology

## 2019-10-26 ENCOUNTER — Ambulatory Visit (INDEPENDENT_AMBULATORY_CARE_PROVIDER_SITE_OTHER): Payer: Medicare Other | Admitting: Neurology

## 2019-10-26 VITALS — BP 104/60 | HR 60 | Temp 97.1°F | Ht 66.0 in | Wt 205.0 lb

## 2019-10-26 DIAGNOSIS — G43709 Chronic migraine without aura, not intractable, without status migrainosus: Secondary | ICD-10-CM | POA: Diagnosis not present

## 2019-10-26 DIAGNOSIS — IMO0002 Reserved for concepts with insufficient information to code with codable children: Secondary | ICD-10-CM

## 2019-10-26 MED ORDER — ONABOTULINUMTOXINA 100 UNITS IJ SOLR
200.0000 [IU] | Freq: Once | INTRAMUSCULAR | Status: AC
  Administered 2019-10-26: 11:00:00 200 [IU] via INTRAMUSCULAR

## 2019-10-26 MED ORDER — AIMOVIG 70 MG/ML ~~LOC~~ SOAJ: 70.0000 mg | SUBCUTANEOUS | 11 refills | Status: DC

## 2019-10-26 NOTE — Progress Notes (Signed)
**  Botox 200 units x 1 vial, [place] [id number], Lot [id number], Exp 06/2022, office supply.//mck,rn**

## 2019-10-26 NOTE — Progress Notes (Signed)
Chief Complaint  Patient presents with  . Migraine    Botox 200 units x 1 vial - office supply      PATIENT: Hayley [name] DOB: July 23, 1946  Chief Complaint  Patient presents with  . Migraine    Botox 200 units x 1 vial - office supply     HISTORICAL  Hayley [name] is a 74 years old right-handed female, seen in refer by hand surgeon Dr. Daryll Brod, and her primary care PA Flora Lipps for evaluation of neck pain, radiating pain to bilateral shoulder, upper extremity  She has past medical history of spasmodic dysphonia, status post surgical correction, also had a history of hypertension, diabetes, DVT, PE following surgical procedure, most recent was right knee replacement in March 2016, is taking anticoagulation pradaxa, chronic low back pain, previous history of epidural injection for low back pain.  She fell from law mower in June 2016, landed on her back, she was able to got up, finished mowing her lawn, later that day, she noticed worsening low back pain, significant neck pain, radiating pain to her bilateral shoulder, upper extremity, hands, she also noticed bilateral hands paresthesia, burning sensation, subjective weakness, she had worsening urinary urgency, wear pads occasionally, mild unbalanced gait, mild constipation  She was evaluated by [facility] orthopedic surgeon Dr. Rolena Infante for her worsening low back pain, reported MRI of lumbar spine at [facility] in July 2016, was told she is not a surgical candidate, she does has multilevel lumbar degenerative disc disease.  She is also concerned about her bilateral hands tremor, getting worse since 2015, most noticeable when she writes, holding utensils, there was no similar family history  UPDATE August 24th 2016: She continues to complain bilateral neck pain, radiating pain to bilateral shoulder, arm, We have reviewed MRI cervical spine (without) demonstrating: 1. At C3-4: disc bulging and uncovertebral joint hypertrophy and  facet hypertrophy with severe left foraminal stenosis 2. At C4-5: disc bulging and uncovertebral joint hypertrophy with severe biforaminal stenosis 3. At C5-6: disc bulging and uncovertebral joint hypertrophy with moderate right and severe left foraminal stenosis 4. At C6-7: disc bulging and uncovertebral joint hypertrophy with moderate biforaminal stenosis 5. No intrinsic or compressive spinal cord lesions  Propanolol 40 mg twice a day has helped her tremor, she denies significant side effect.  UPDATE June 26th 2017: She continue complains of bilateral lower extremity posturing tremor, significant neck pain, radiating pain to bilateral upper extremity, increased unsteady gait, she has fell few times, worsening bilateral lower extremity paresthesia  UPDATE Sept 1 2017: She has worsening low back pain, it gets worse with any movement, difficulty changing bed linen, she is no longer cooking,   She recently had spinal stimulator trial by [facility] by Dr. Delfino Lovett Rauck,,was deemed to be a potential candidate, she did responding well to the spinal stimulator. She complains of midline constant low back pain, radiating down to right hip, right leg and left leg, getting worse with movement, she has bladder urgency, no bowel incontinence.   She had physical therapy, last in July 2017,    She also complains of migraine, seems to get more frequent since March 2017, she is now having headaches 3-5/week, lasting for 4 hours or longer, with light noise sensitivity nauseous,  She used to get Botox injection as migraine prevention from Dr. Samuel Germany, responded very well, last injection was in 2015, responding well. She is taking Imitrex, Zofran as needed  She is on antibiotics for chronic UTI,  Update June 20 2016: She came in for Botox injection as migraine prevention, also complains worsening bilateral hands tremor, hope to received EMG guided Botox injection for her right hand  tremor.  UPDATE Sep 27 2015: She had sudden onset of left shoulder pain, neck pain, I personally reviewed x-ray of the cervical spine, evidence of multilevel degenerative disc disease most severe C5-6 C6-7, mild to moderate bilateral foraminal stenosis  She responded well to previous Botox injection in October 2017, reported 75% improvement, over past 3 months, used Imitrex injection once, 4 tablets of Imitrex 25 mg, works well,  She was not sure about the benefit of EMG guided botulism toxin injection for her right hand tremor,  UPDATE December 19 2016: She complains of frequent headaches since December 16 2016, bilateral occipital region, pressure, pounding, nauseous, light sensitivity, she still complains 7 out of 10 headaches, despite multiple dose of Imitrex 25 mg tablets, and Imitrex injection, she has used up all her supplies, she also noticed increased bilateral hands tremor, feeling electronic sensation underneath her skin, to her shoulder down to her arms and legs, it is out of her control, but she denied anxiety or depression,  She has self adjusted her medication recently, she stopped the Wellbutrin by herself for a while, started about 3 weeks ago, also has run out of propanolol,  Husband also reported that she spent most of time in her bed, not feeling well.  UPDATE January 02 2017: Her headache overall is under good control, return for Botox injection as migraine prevention, also extra injection for her right hand tremor, writer's cramp  UPDATE Sept 19 2018: She responded well to previous injection for migraine prevention but did not help her essential tremor much,  UPDATE Jun 25 2018: Her husband has stroke in Dec 2018, she has gone through significant emotional and financial stress, suffered a significant depression, attempted suicidal overdose on Percocet in 2018/01/03, she also presented with significant GI symptoms, nausea vomiting abdominal pain, was treated with IV fluid, diagnosed  with pancreatitis, was restarted on her antidepression Wellbutrin, trazodone, she is now discharged home, still dealing with significant depression, complains of intermittent diarrhea, stomach pain, lack of energy, She continue have frequent headaches, previous Botox works well for her migraine headaches, wants to continue Botox injection.  I also reviewed her geneSight psychotropic pharmacogenomic test result, that is most related to antipsychotics, antidepression use  UPDATE Jul 17 2018: She continues to have significant headaches, came in for Botox injection,  UPDATE Nov 13 2018: She complains of significant depression today, her husband is in nursing home, suffered a heart attack in January 2020, planning on to call in hospice care,  She complains of worsening bilateral hands tremor, previously tried primidone, propanolol all significant improvement, clonazepam 0.5 mg seems to help her some, I checked it [place] controlled substance registry, there is no history of overuse,  Her migraine overall is under good control with Botox injection, she is having migraine 1-2 times each week,  She also complains of diffuse body achy pain  Update 05-29-19: Her husband passed away in 2019/01/04, she has gone through a lot of stress, hospital admission on April 05, 2019 for acute respiratory failure with hypoxia, she was considered to have multifactorial restrictive disease due to chronic basilar scarring of the lung/atelectasis, obstructive sleep apnea, obesity, hypoventilation syndrome, she was discharged home with nighttime oxygen, she was diagnosed with sleep apnea obstructive many years ago, but no longer using her  CPAP machine  She continue have intermittent migraine headaches, no longer uses triptan, Fioricet helps some, previous Botox injection is helpful, especially to alleviated her neck tension  UPDATE Feb 8th 2021: She missed her scheduled Botox injection as migraine prevention,  noticed increased migraine headache over past 2 months, every other day, she has to take Tylenol, or Fioricet as needed   REVIEW OF SYSTEMS: Full 14 system review of systems performed and notable only for above   ALLERGIES: Allergies  Allergen Reactions  . Bee Venom Anaphylaxis  . Morphine And Related   . Tape Rash    Rash - only use paper tape  . Etodolac Diarrhea and Nausea And Vomiting  . Morphine Other (See Comments)    Hallucinations  . Other Itching and Swelling    Bee venom  . Poison Ivy Extract  [Poison Ivy Extract] Itching and Swelling  . Propoxyphene Nausea And Vomiting  . Propoxyphene N-Acetaminophen Nausea And Vomiting  . Penicillins Rash    Has patient had a PCN reaction causing immediate rash, facial/tongue/throat swelling, SOB or lightheadedness with hypotension: Yes Has patient had a PCN reaction causing severe rash involving mucus membranes or skin necrosis: No Has patient had a PCN reaction that required hospitalization No Has patient had a PCN reaction occurring within the last 10 years: No If all of the above answers are "NO", then may proceed with Cephalosporin use.     HOME MEDICATIONS: Current Outpatient Prescriptions  Medication Sig Dispense Refill  . albuterol (PROVENTIL HFA;VENTOLIN HFA) 108 (90 BASE) MCG/ACT inhaler Inhale 1 puff into the lungs every 6 (six) hours as needed for wheezing or shortness of breath.    [name] albuterol (PROVENTIL) (2.5 MG/3ML) 0.083% nebulizer solution Take 2.5 mg by nebulization every 6 (six) hours as needed for wheezing or shortness of breath.    . benzonatate (TESSALON) 100 MG capsule Take 100 mg by mouth 3 (three) times daily as needed for cough.    . clonazePAM (KLONOPIN) 0.5 MG tablet Take 0.5 mg by mouth at bedtime.    . cyclobenzaprine (FLEXERIL) 10 MG tablet Take 10 mg by mouth at bedtime.     [name] EPINEPHrine (EPIPEN) 0.3 mg/0.3 mL DEVI Inject 0.3 mg into the muscle once as needed (Allergic reaction).     . fluorometholone  (FML) 0.1 % ophthalmic suspension Place 1 drop into both eyes daily as needed (Eye allergies).   0  . fluticasone (FLONASE) 50 MCG/ACT nasal spray Place 1 spray into both nostrils daily as needed for allergies or rhinitis.    [name] gabapentin (NEURONTIN) 800 MG tablet Take 800-1,600 mg by mouth 2 (two) times daily. 800 in the morning and 1600 at pm    . HYDROcodone-acetaminophen (NORCO/VICODIN) 5-325 MG per tablet Take one to two tablets by mouth every 4 hours as needed for pain. Do not exceed 4gm of Tylenol in 24 hours 360 tablet 0  . levothyroxine (SYNTHROID, LEVOTHROID) 25 MCG tablet Take 25 mcg by mouth every morning.     . methocarbamol (ROBAXIN) 500 MG tablet Take 1 tablet (500 mg total) by mouth every 6 (six) hours as needed for muscle spasms. 90 tablet 0  . mometasone-formoterol (DULERA) 100-5 MCG/ACT AERO Inhale 2 puffs into the lungs 2 (two) times daily.    [name] nystatin (MYCOSTATIN) 100000 UNIT/ML suspension Take 5 mLs by mouth daily as needed (Yeast).    . ondansetron (ZOFRAN) 4 MG tablet Take 4 mg by mouth every 8 (eight) hours as needed for nausea or  vomiting.    . ondansetron (ZOFRAN-ODT) 8 MG disintegrating tablet Take 8 mg by mouth every 8 (eight) hours as needed for nausea or vomiting.    [name] oxyCODONE (OXY IR/ROXICODONE) 5 MG immediate release tablet Take 1-2 tablets (5-10 mg total) by mouth every 3 (three) hours as needed for moderate pain or severe pain. 90 tablet 0  . pantoprazole (PROTONIX) 40 MG tablet Take 40 mg by mouth daily.    . Phenazopyridine HCl (AZO TABS PO) Take 1 tablet by mouth daily as needed (UTI).    . primidone (MYSOLINE) 50 MG tablet Take 50 mg by mouth at bedtime.    . promethazine (PHENERGAN) 25 MG tablet Take 25 mg by mouth every 6 (six) hours as needed for nausea or vomiting.    . Rivaroxaban (XARELTO) 15 MG TABS tablet Take 1 tablet (15 mg total) by mouth 2 (two) times daily with a meal. 60 tablet 5  . rizatriptan (MAXALT) 10 MG tablet Take 10 mg by mouth as  needed for migraine (migraine). May repeat in 2 hours if needed    . sertraline (ZOLOFT) 100 MG tablet Take 100 mg by mouth daily.    . sitaGLIPtin (JANUVIA) 100 MG tablet Take 100 mg by mouth every evening.     . SUMAtriptan (IMITREX) 6 MG/0.5ML SOLN injection Inject 6 mg into the skin every 2 (two) hours as needed for migraine or headache. May repeat in 2 hours if headache persists or recurs.    . valACYclovir (VALTREX) 1000 MG tablet Take 2,000 mg by mouth 2 (two) times daily as needed (Fever blisters).   2     PAST MEDICAL HISTORY: Past Medical History:  Diagnosis Date  . Anemia   . Anxiety   . Bronchitis    hx of   . Clotting disorder ([place]) 02-17-14   S/p appendectomy-developed Pulmonary emboli-tx. warfarin-d/c 1 yr ago.  . Colon polyps   . Depression   . Diabetes mellitus without complication ([place])    "borderline"- oral med  . Disorder of vocal cords    spasmotic dysphonia ,02-17-14 has" whispery voice-low tone"  . Hyperthyroidism   . Kidney stones   . Migraines   . Nausea Nov 15, 2014   cycle of migraine headaches and nausea  . Neuromuscular disorder ([place])    hands and throat"spasmodic dysphonia", tremors - thimb and forefinger   . Obesity, Class III, BMI 40-49.9 (morbid obesity) ([place]) 04/27/2010   02-17-14 reports some weight loss- intentional  . Osteoarthritis    in back(chronic pain)  . Pelvic pain    02-17-14 "states thinks its back related on left groin"  . Pulmonary emboli [facility])    s/p Appendectomy '13 [facility]  . Reflux   . Right knee pain   . Sleep apnea    cpap - settings at 4   . Toxic goiter     PAST SURGICAL HISTORY: Past Surgical History:  Procedure Laterality Date  . ABDOMINAL HYSTERECTOMY    . APPENDECTOMY     3'13 [facility] s/p developed  Pulmonary Emboli  . BOTOX INJECTION     for migranes  . CATARACT EXTRACTION, BILATERAL Bilateral 03/2013   . CHOLECYSTECTOMY    . HAND SURGERY  2004   both hands  . KIDNEY STONE SURGERY    . KNEE ARTHROSCOPY  1998    right  . LAPAROSCOPIC APPENDECTOMY  11/27/2011   Procedure: APPENDECTOMY LAPAROSCOPIC;  Surgeon: Adin Hector, MD;  Location: [facility];  Service: General;  Laterality:  N/A;  . SPINAL CORD STIMULATOR IMPLANT  06/04/2016   Dr. Alfonse Ras at [facility]  . TOTAL KNEE ARTHROPLASTY Right 12/06/2014   Procedure: RIGHT TOTAL KNEE ARTHROPLASTY;  Surgeon: Gaynelle Arabian, MD;  Location: [facility];  Service: Orthopedics;  Laterality: Right;  . TOTAL KNEE REVISION Left 02/24/2014   Procedure: LEFT TOTAL KNEE REVISION;  Surgeon: Gearlean Alf, MD;  Location: [facility];  Service: Orthopedics;  Laterality: Left;  . vocal cord surgery  02-17-14   01-12-14 done in [place] [facility](s/p selective denervation/reinnervation recurrent laryngeal nerve surgery)  . zenkers diverticulum      FAMILY HISTORY: Family History  Problem Relation Age of Onset  . Heart disease Mother   . Pneumonia Father   . Cirrhosis Father   . Hypertension Father   . Alcoholism Father   . Heart attack Paternal Grandfather   . Heart attack Maternal Grandfather   . Heart attack Paternal Grandmother   . Heart attack Maternal Grandmother     SOCIAL HISTORY:  Social History   Socioeconomic History  . Marital status: Married    Spouse name: Not on file  . Number of children: 5  . Years of education: 54  . Highest education level: Not on file  Occupational History  . Occupation: Retired  Tobacco Use  . Smoking status: Former Smoker    Quit date: 01/03/1982    Years since quitting: 37.8  . Smokeless tobacco: Never Used  Substance and Sexual Activity  . Alcohol use: No  . Drug use: No  . Sexual activity: Not Currently  Other Topics Concern  . Not on file  Social History Narrative   Lives at home alone at this time.   Right-handed.   2 cups caffeine per day.   Social Determinants of Health   Financial Resource Strain:   . Difficulty of Paying Living Expenses: Not on file  Food Insecurity:   . Worried About Paediatric nurse in the Last Year: Not on file  . Ran Out of Food in the Last Year: Not on file  Transportation Needs:   . Lack of Transportation (Medical): Not on file  . Lack of Transportation (Non-Medical): Not on file  Physical Activity:   . Days of Exercise per Week: Not on file  . Minutes of Exercise per Session: Not on file  Stress:   . Feeling of Stress : Not on file  Social Connections:   . Frequency of Communication with Friends and Family: Not on file  . Frequency of Social Gatherings with Friends and Family: Not on file  . Attends Religious Services: Not on file  . Active Member of Clubs or Organizations: Not on file  . Attends Archivist Meetings: Not on file  . Marital Status: Not on file  Intimate Partner Violence:   . Fear of Current or Ex-Partner: Not on file  . Emotionally Abused: Not on file  . Physically Abused: Not on file  . Sexually Abused: Not on file     PHYSICAL EXAM   Vitals:   10/26/19 0944  BP: 104/60  Pulse: 60  Temp: (!) 97.1 F (36.2 C)  Weight: 205 lb (93 kg)  Height: 5\' 6"  (1.676 m)    Not recorded      DIAGNOSTIC DATA (LABS, IMAGING, TESTING) - I reviewed patient records, labs, notes, testing and imaging myself where available.   ASSESSMENT AND PLAN  TERSEA MOSHER is a 74 y.o. female   Chronic  migraine headaches  Concurrent with her worsening depression  Zofran, NSAIDs Fioricet as needed as needed.  Botox injection for chronic migraine prevention, injection was performed according to [place] protocol,  5 units of Botox was injected into each side, for 31 injection sites, total of 155 units  Bilateral frontalis 4 injection sites Bilateral corrugate 2 injection sites Procerus 1 injection sites. Bilateral temporalis 8 injection sites Bilateral occipitalis 6 injection sites Bilateral cervical paraspinals 4 injection sites Bilateral upper trapezius 6 injection sites  Extra 45 unites were injected into bilateral cervical  paraspinal muscles, and levator scapular  I will start Aimovig 70 mg every months as migraine prevention  Marcial Pacas, M.D. Ph.D.  [facility] [address], [place], [place] [address] Ph: [phone] Fax: [phone]  CC: To Dr. Daryll Brod, Flora Lipps

## 2019-11-06 DIAGNOSIS — E119 Type 2 diabetes mellitus without complications: Secondary | ICD-10-CM | POA: Diagnosis not present

## 2019-11-06 DIAGNOSIS — E785 Hyperlipidemia, unspecified: Secondary | ICD-10-CM | POA: Diagnosis not present

## 2019-11-06 DIAGNOSIS — Z5181 Encounter for therapeutic drug level monitoring: Secondary | ICD-10-CM | POA: Diagnosis not present

## 2019-11-06 DIAGNOSIS — E039 Hypothyroidism, unspecified: Secondary | ICD-10-CM | POA: Diagnosis not present

## 2019-11-20 IMAGING — DX DG CHEST DECUBITUS BILAT
2 series · 2 of 2 positions shown · non-contrast
Comparison: Portable chest 11/19/2017 and chest CT 11/17/2017.

CLINICAL DATA: Cough today.  Follow-up atelectasis.

EXAM:
CHEST - BILATERAL DECUBITUS VIEW

[chest decu (1 of 2)]
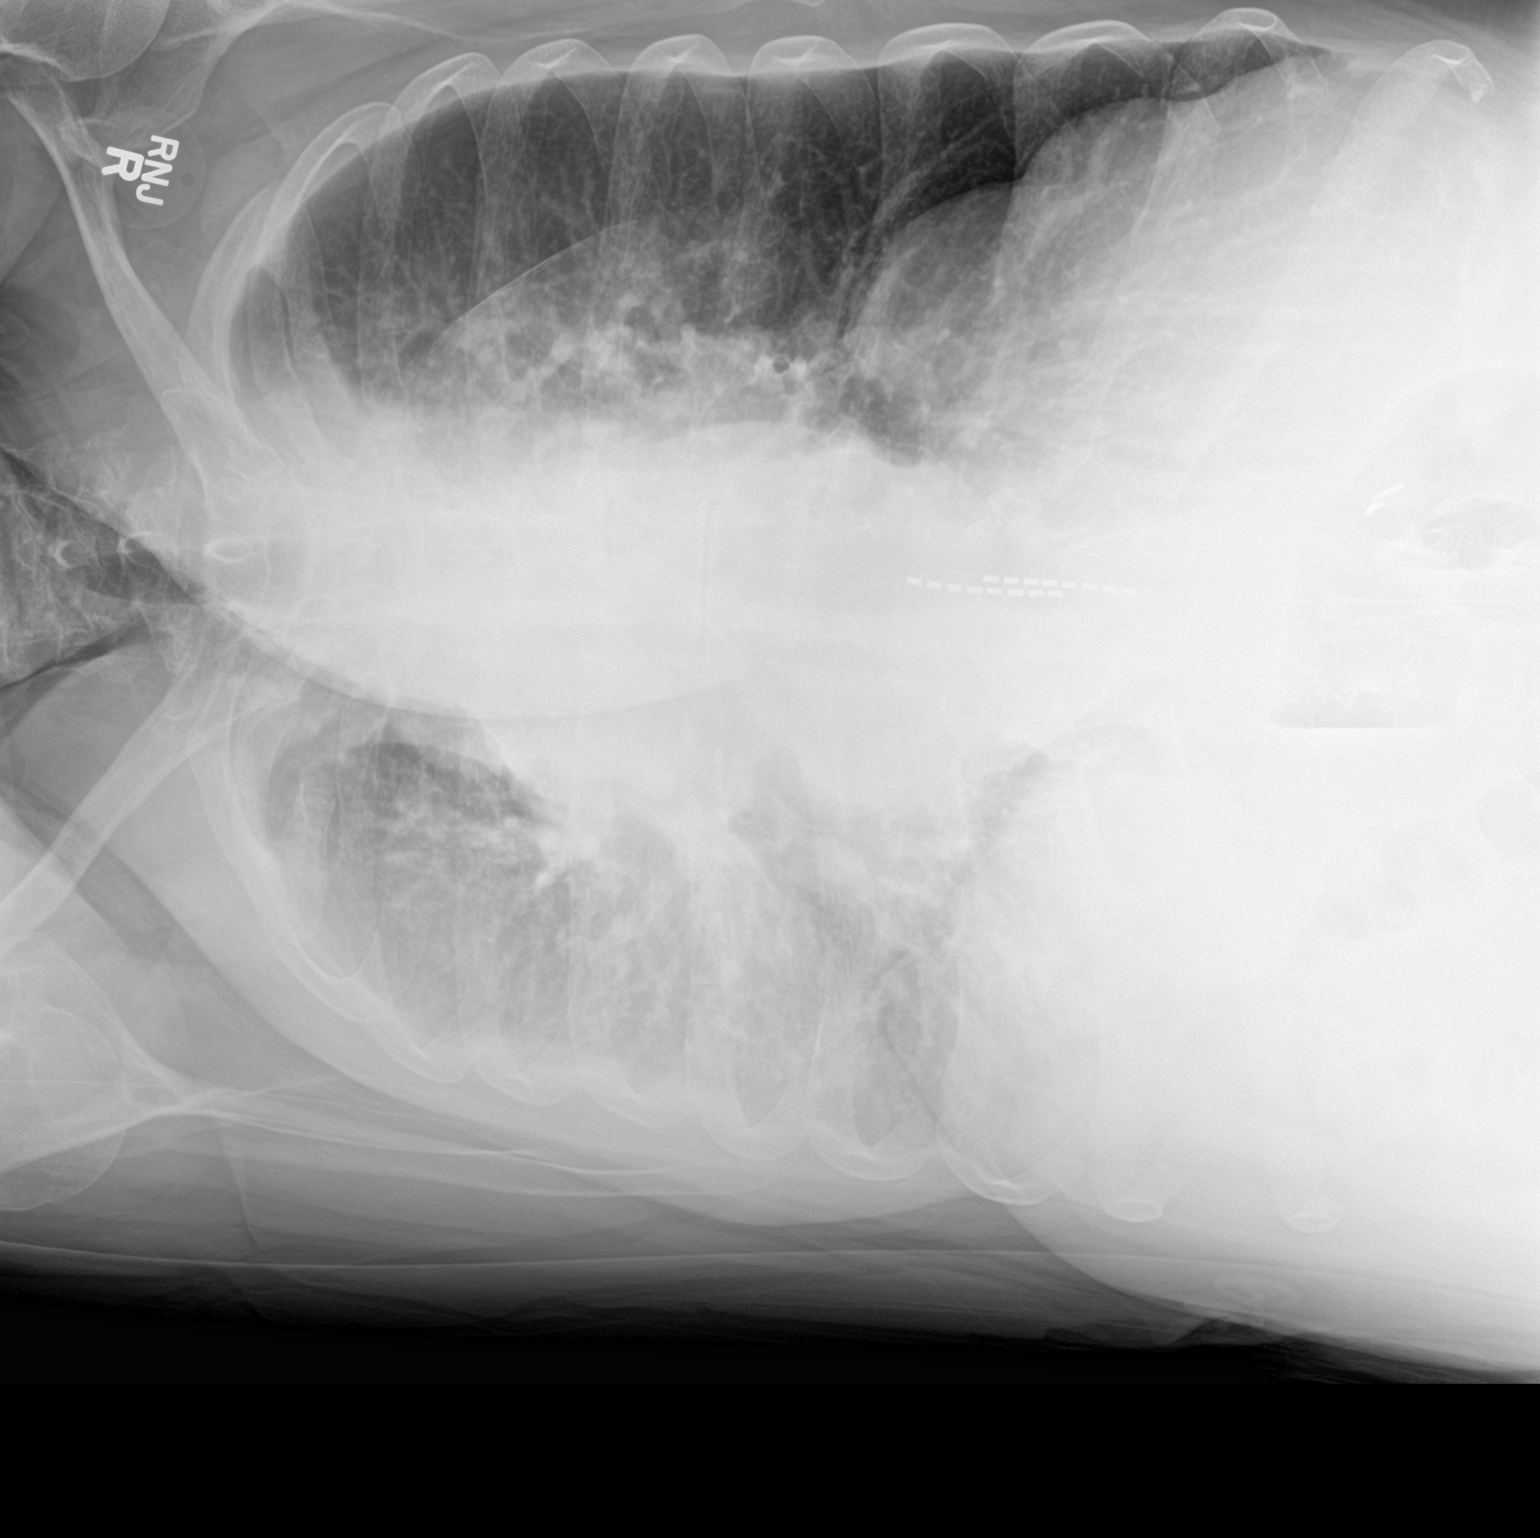

[chest decu (2 of 2)]
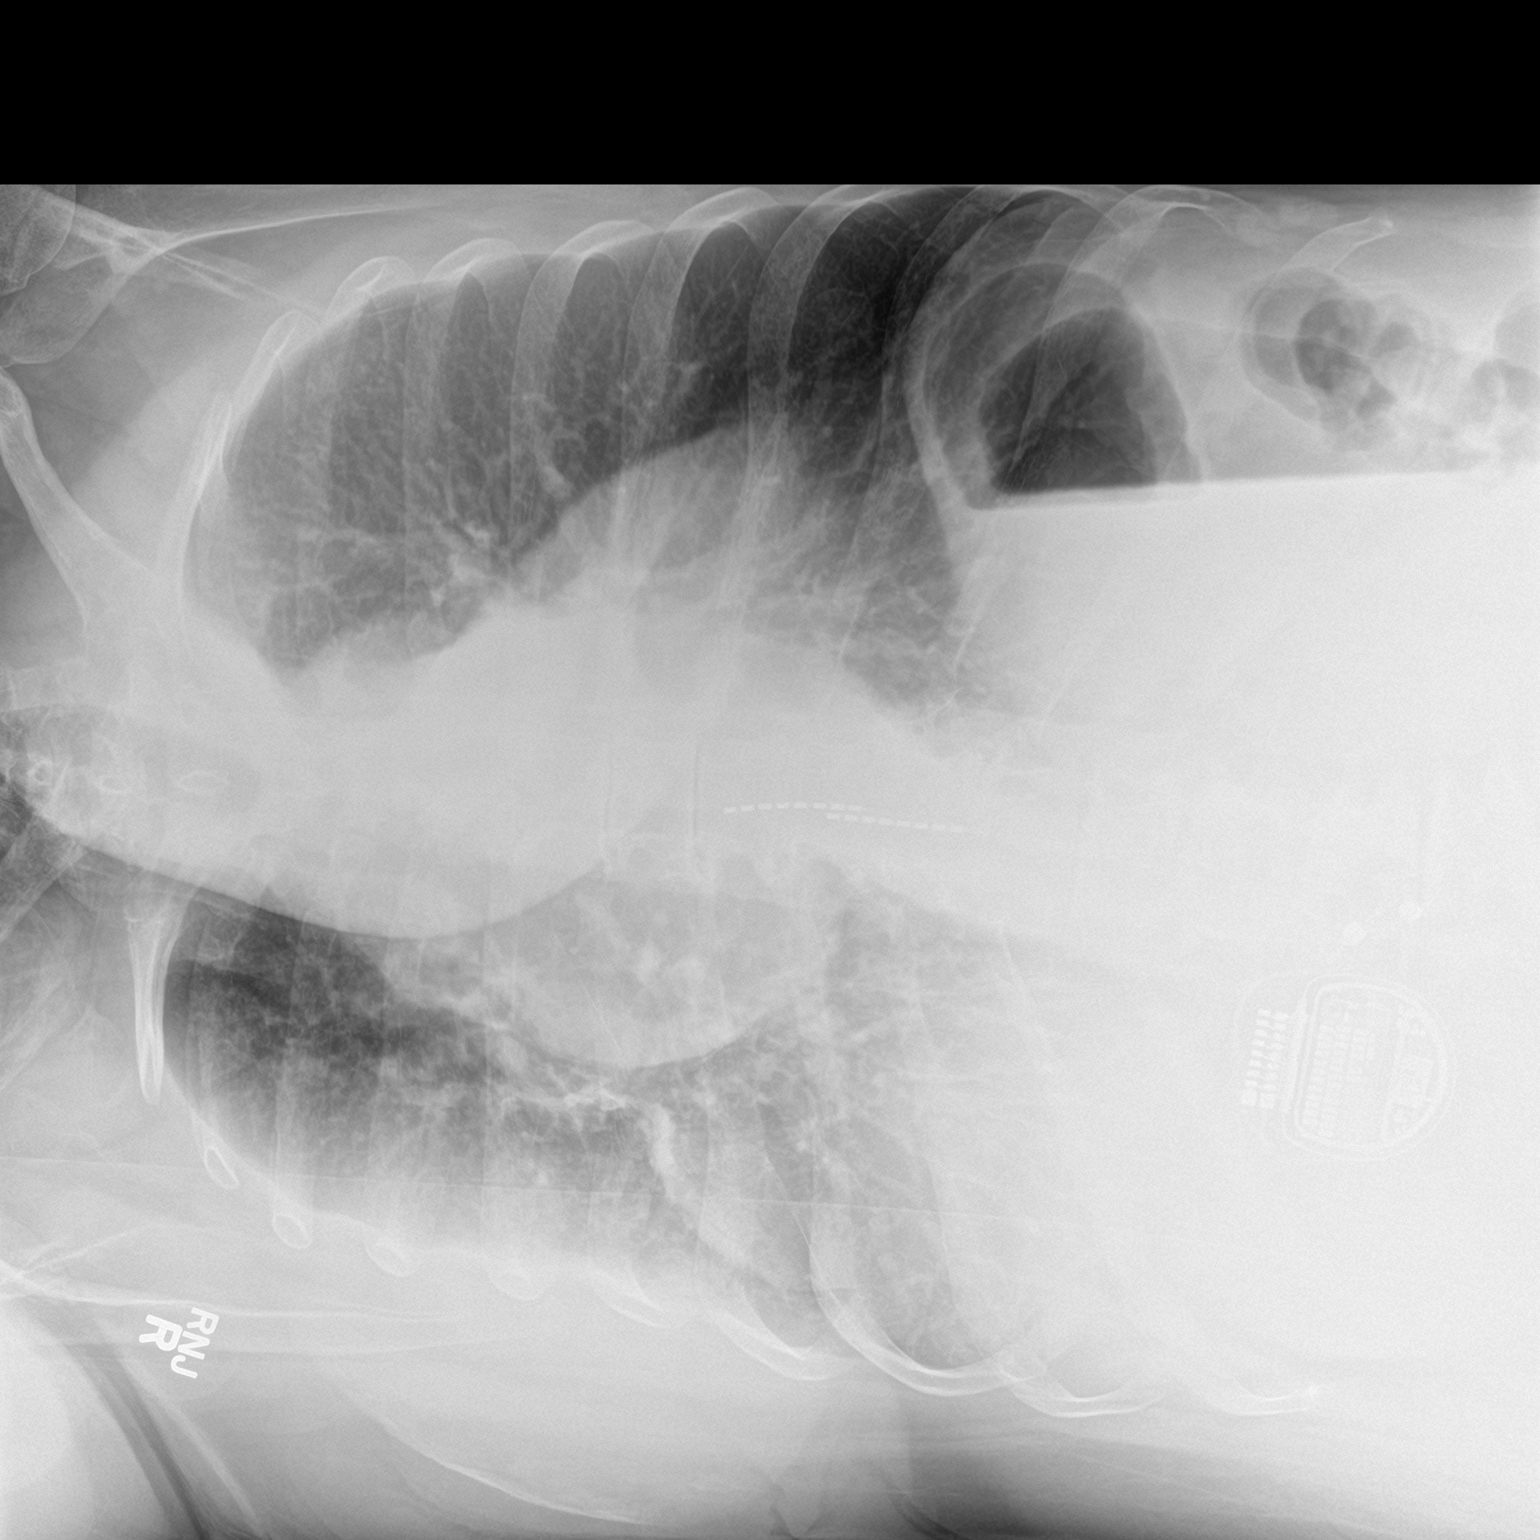

[2 of 2 positions shown; findings below may reference images not displayed]

FINDINGS: Bilateral decubitus views demonstrate no significant layering
pleural fluid. The non dependent lung appears adequately aerated on
these views. Spinal stimulator noted.
IMPRESSION: No significant layering pleural fluid or focal airspace disease.

## 2019-11-20 IMAGING — RF DG SWALLOWING FUNCTION - NRPT [facility]
8 series · 17 of 24 positions shown · non-contrast
Comparison: none

[Series 1: cp_standard · 0.34mm/px · 2 of 106 frames shown (1 of 8)]
[frame 16/106]
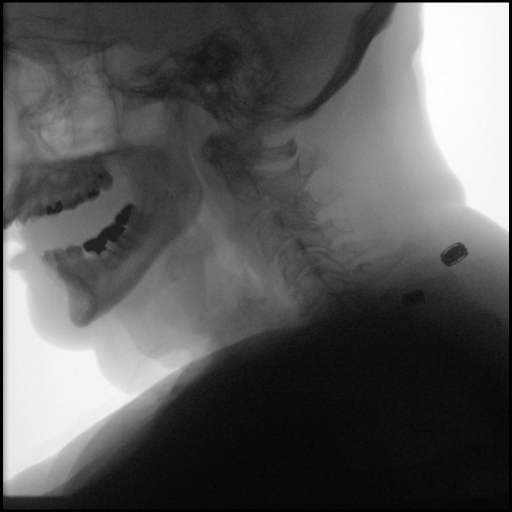
[frame 54/106]
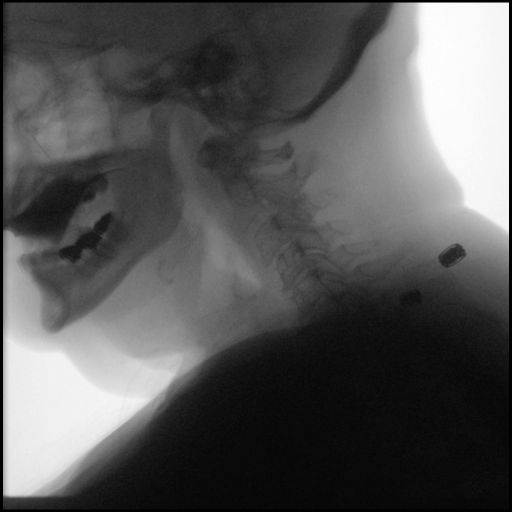

[Series 2: cp_standard · 0.34mm/px · 3 of 188 frames shown (2 of 8)]
[frame 29/188]
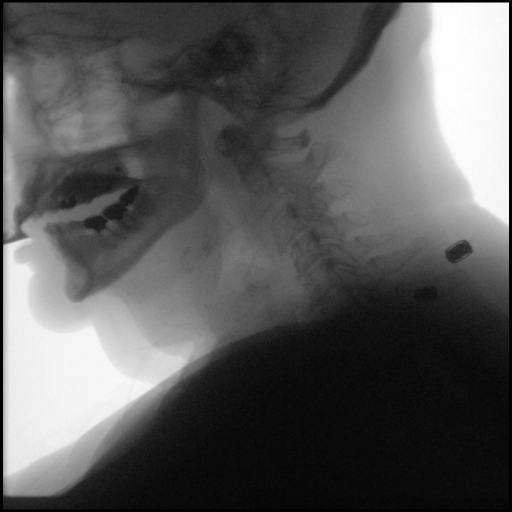
[frame 82/188]
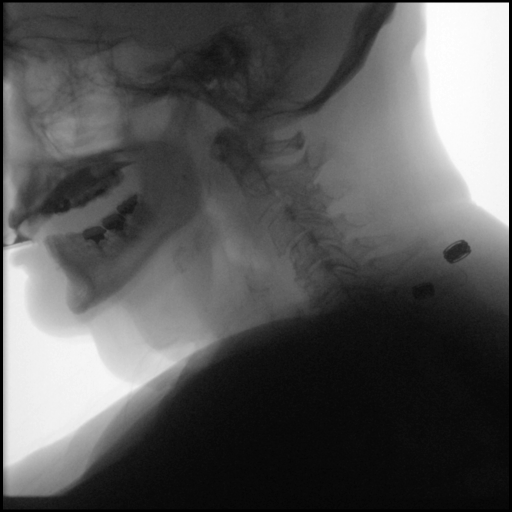
[frame 160/188]
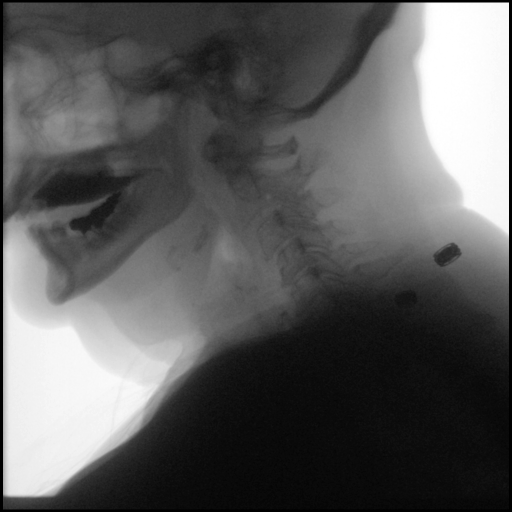

[Series 3: cp_standard · 0.34mm/px · 2 of 93 frames shown (3 of 8)]
[frame 41/93]
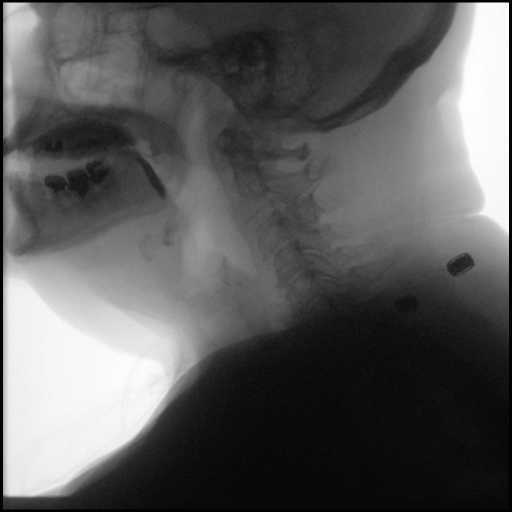
[frame 80/93]
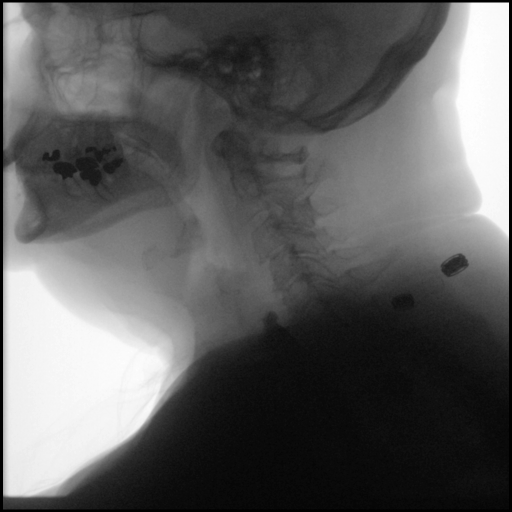

[Series 4: cp_standard · 0.34mm/px · 2 of 114 frames shown (4 of 8)]
[frame 18/114]
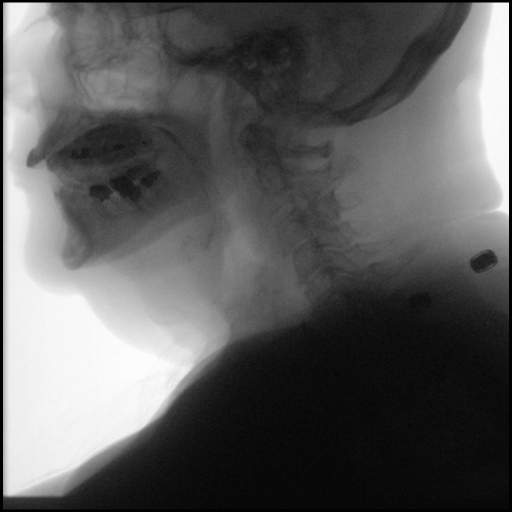
[frame 97/114]
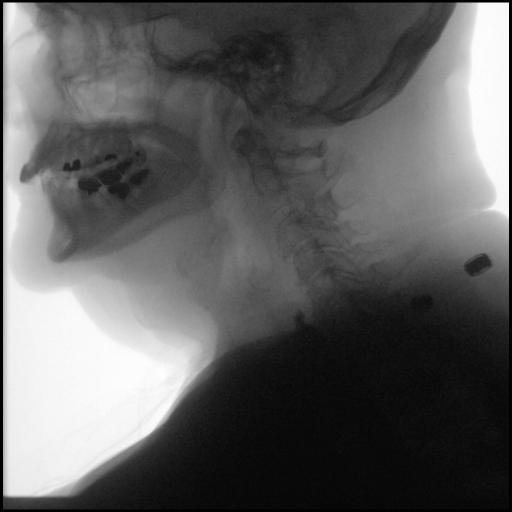

[Series 5: cp_standard · 0.34mm/px · 3 of 17 frames shown (5 of 8)]
[frame 1/17]
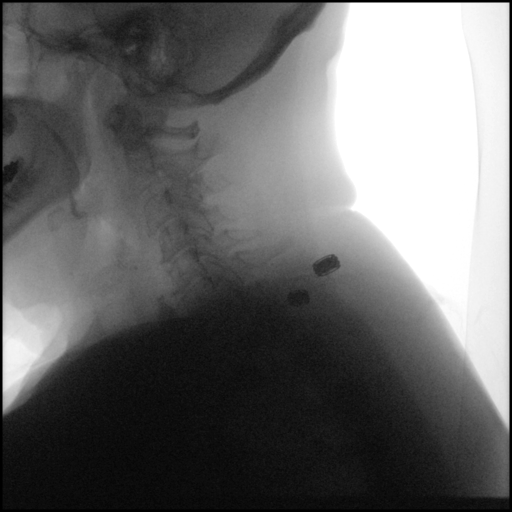
[frame 3/17]
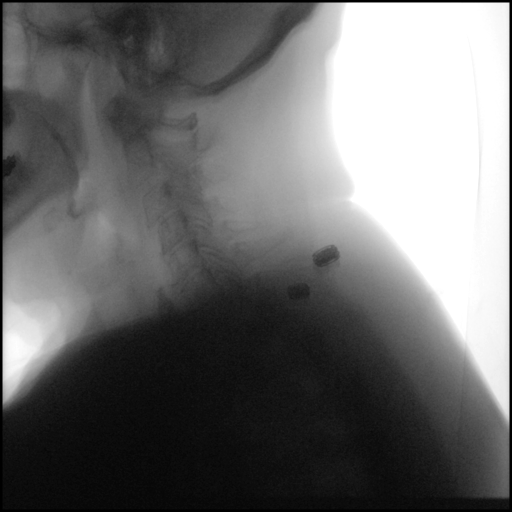
[frame 15/17]
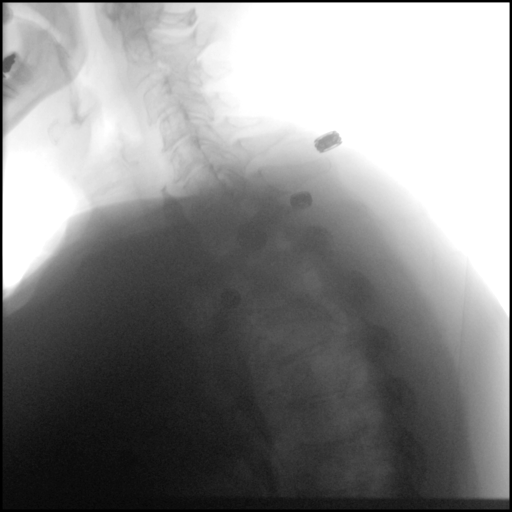

[Series 6: cp_standard · 0.34mm/px · 2 of 197 frames shown (6 of 8)]
[frame 47/197]
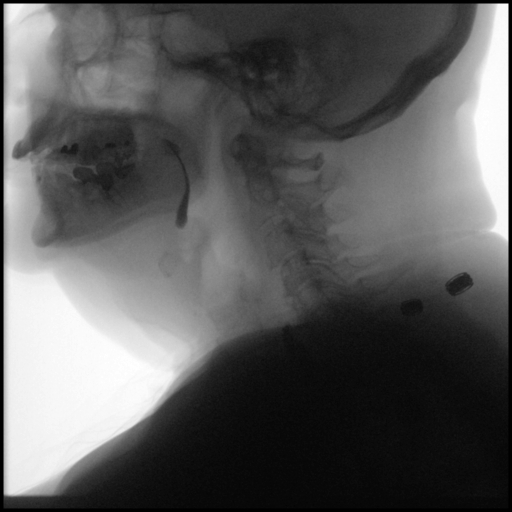
[frame 168/197]
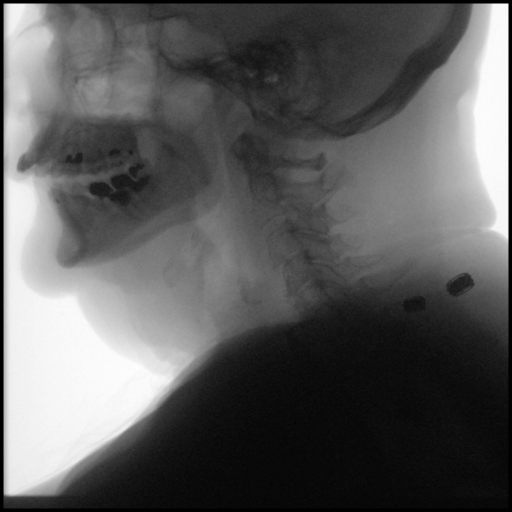

[Series 7: cp_standard · 0.34mm/px · 2 of 146 frames shown (7 of 8)]
[frame 22/146]
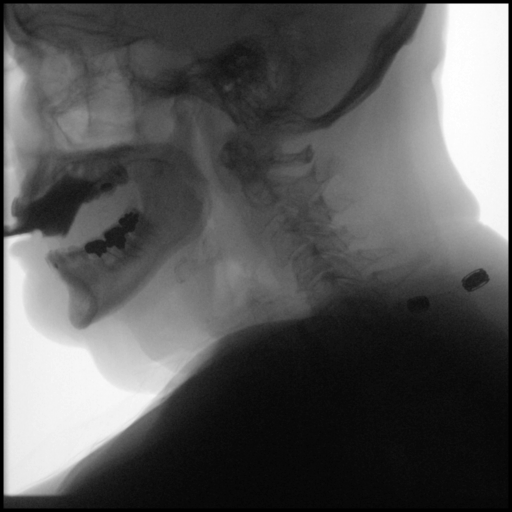
[frame 74/146]
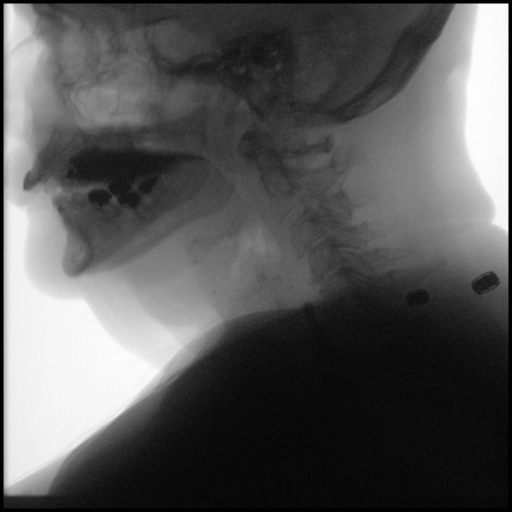

[Series 8: cp_standard · 0.17mm/px · 1 of 1 slices shown (8 of 8)]
[im 1/1]
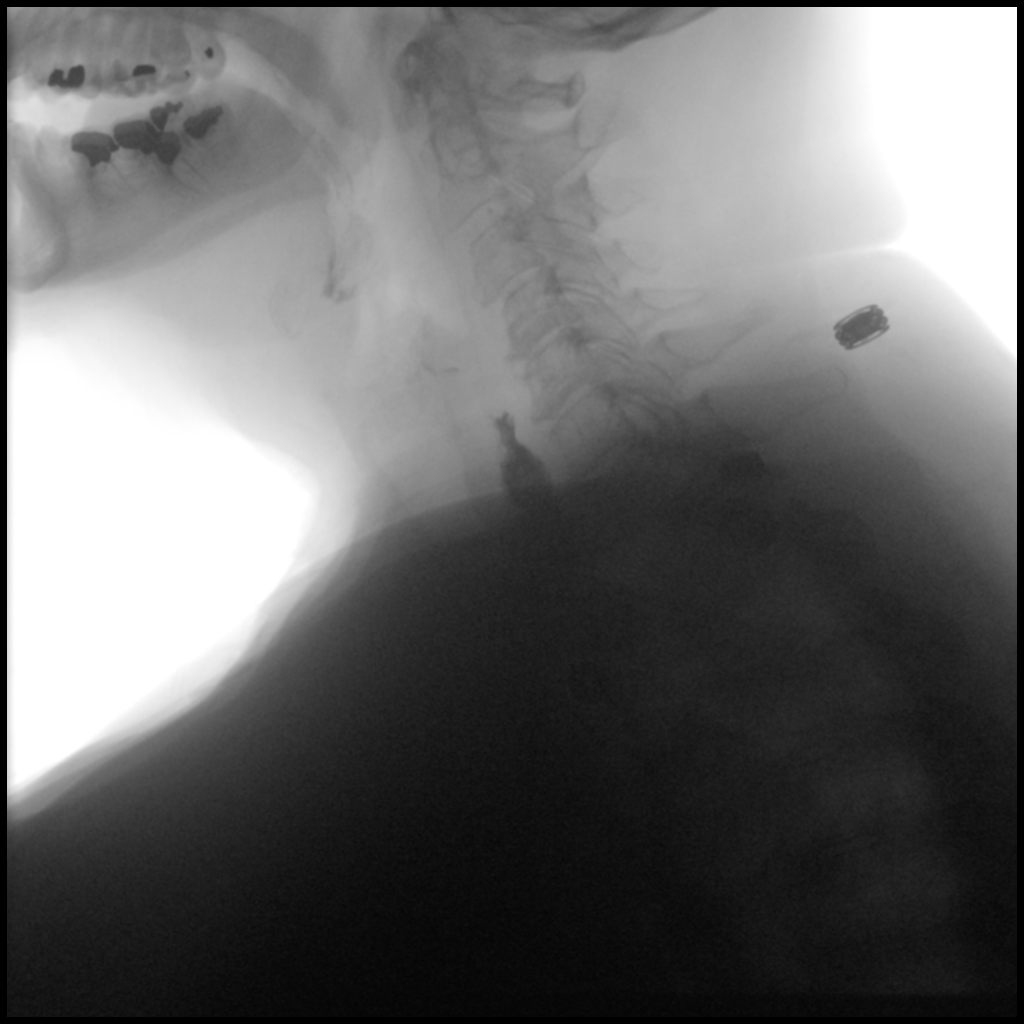

[17 of 24 positions shown; findings below may reference images not displayed]

FLUOROSCOPY FOR SWALLOWING FUNCTION STUDY:
Fluoroscopy was provided for swallowing function study, which was administered by a speech pathologist.  Final results and recommendations from this study are contained within the speech pathology report.

## 2019-12-02 ENCOUNTER — Other Ambulatory Visit: Payer: Self-pay

## 2019-12-02 ENCOUNTER — Ambulatory Visit (INDEPENDENT_AMBULATORY_CARE_PROVIDER_SITE_OTHER): Payer: Medicare Other | Admitting: Psychiatry

## 2019-12-02 ENCOUNTER — Encounter (HOSPITAL_COMMUNITY): Payer: Self-pay | Admitting: Psychiatry

## 2019-12-02 DIAGNOSIS — Z87891 Personal history of nicotine dependence: Secondary | ICD-10-CM | POA: Diagnosis not present

## 2019-12-02 DIAGNOSIS — F322 Major depressive disorder, single episode, severe without psychotic features: Secondary | ICD-10-CM

## 2019-12-02 DIAGNOSIS — G250 Essential tremor: Secondary | ICD-10-CM | POA: Diagnosis not present

## 2019-12-02 DIAGNOSIS — F411 Generalized anxiety disorder: Secondary | ICD-10-CM

## 2019-12-02 MED ORDER — TRAZODONE HCL 150 MG PO TABS: 150.0000 mg | ORAL_TABLET | Freq: Every day | ORAL | 0 refills | Status: DC

## 2019-12-02 MED ORDER — VORTIOXETINE HBR 20 MG PO TABS: 20.0000 mg | ORAL_TABLET | Freq: Every day | ORAL | 0 refills | Status: DC
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
## 2019-12-23 ENCOUNTER — Ambulatory Visit: Payer: Self-pay | Admitting: Neurology

## 2020-01-19 DIAGNOSIS — M5416 Radiculopathy, lumbar region: Secondary | ICD-10-CM | POA: Diagnosis not present

## 2020-01-27 ENCOUNTER — Ambulatory Visit: Payer: Self-pay | Admitting: Neurology

## 2020-01-27 ENCOUNTER — Telehealth: Payer: Self-pay | Admitting: Neurology

## 2020-01-27 NOTE — Telephone Encounter (Signed)
I returned patient call to reschedule. I offered her June 9 but she states the she is having some teeth extracted on May 26 and doesn't think she will be up to Botox that soon.  She will call back after she is on the mend  to reschedule.

## 2020-01-27 NOTE — Telephone Encounter (Signed)
Pt called needing to cancel her Botox appt. Appt has been canceled. Please call pt back when available to reschedule.

## 2020-02-03 DIAGNOSIS — L237 Allergic contact dermatitis due to plants, except food: Secondary | ICD-10-CM | POA: Diagnosis not present

## 2020-02-03 DIAGNOSIS — R05 Cough: Secondary | ICD-10-CM | POA: Diagnosis not present

## 2020-03-03 ENCOUNTER — Telehealth (HOSPITAL_COMMUNITY): Payer: Self-pay

## 2020-03-03 ENCOUNTER — Telehealth (INDEPENDENT_AMBULATORY_CARE_PROVIDER_SITE_OTHER): Payer: Medicare Other | Admitting: Psychiatry

## 2020-03-03 ENCOUNTER — Encounter (HOSPITAL_COMMUNITY): Payer: Self-pay | Admitting: Psychiatry

## 2020-03-03 ENCOUNTER — Other Ambulatory Visit: Payer: Self-pay

## 2020-03-03 VITALS — Wt 190.0 lb

## 2020-03-03 DIAGNOSIS — F322 Major depressive disorder, single episode, severe without psychotic features: Secondary | ICD-10-CM | POA: Diagnosis not present

## 2020-03-03 DIAGNOSIS — F419 Anxiety disorder, unspecified: Secondary | ICD-10-CM | POA: Diagnosis not present

## 2020-03-03 MED ORDER — TRAZODONE HCL 150 MG PO TABS: 150.0000 mg | ORAL_TABLET | Freq: Every day | ORAL | 0 refills | Status: DC

## 2020-03-03 MED ORDER — VORTIOXETINE HBR 20 MG PO TABS: 20.0000 mg | ORAL_TABLET | Freq: Every day | ORAL | 0 refills | Status: DC

## 2020-03-03 MED ORDER — LAMOTRIGINE 25 MG PO TABS: ORAL_TABLET | ORAL | 1 refills | Status: DC

## 2020-03-03 NOTE — Progress Notes (Signed)
Virtual Visit via Telephone Note  I connected with Hayley [name] on 03/03/20 at  3:40 PM EDT by telephone and verified that I am speaking with the correct person using two identifiers.   I discussed the limitations, risks, security and privacy concerns of performing an evaluation and management service by telephone and the availability of in person appointments. I also discussed with the patient that there may be a patient responsible charge related to this service. The patient expressed understanding and agreed to proceed.   Patient location; home Provider location; home office  History of Present Illness: Patient is evaluated by phone session.  She is taking Trintellix and trazodone.  She is sleeping better with trazodone but still struggles with lack of motivation, energy and desire to do things.  She has tremors but unable to see neurology.  She has headaches and getting Botox but she did not went to the last appointment.  She admitted some financial concerns and right now she is trying to fix her teeth and dentures.  She admitted lack of appetite and lost some weight.  She had a lot of ruminative and negative thoughts but denies any suicidal thoughts, hallucination or paranoia.  She endorsed a lot of anxiety and nervousness and she thinks about the future and finances.  She is also thinking about joining the research program about her headaches and tremors.  She is not sure if she will pursue that.  She denies drinking or using any illegal substances.     Past Psychiatric History:Reviewed. H/Odepression and anxiety.H/Osuicidal attempt on Percocet in March 2019.TookEffexorin her 50sbut stopped after working for a while. Tried Zoloft, Paxil and Wellbutrin fromPCP but did not work. Did IOP,PHPand givenProzac butnoimprovement.Tried lexapro caused GI s/e.  Psychiatric Specialty Exam: Physical Exam  Review of Systems  Weight 190 lb (86.2 kg), last menstrual period  09/18/1979.There is no height or weight on file to calculate BMI.  General Appearance: NA  Eye Contact:  NA  Speech:  Slow  Volume:  Decreased  Mood:  Dysphoric  Affect:  NA  Thought Process:  Goal Directed  Orientation:  Full (Time, Place, and Person)  Thought Content:  Rumination  Suicidal Thoughts:  No  Homicidal Thoughts:  No  Memory:  Immediate;   Good Recent;   Good Remote;   Good  Judgement:  Intact  Insight:  Fair  Psychomotor Activity:  NA  Concentration:  Concentration: Fair and Attention Span: Fair  Recall:  Good  Fund of Knowledge:  Good  Language:  Good  Akathisia:  No  Handed:  Right  AIMS (if indicated):     Assets:  Communication Skills Desire for Improvement Housing Resilience  ADL's:  Intact  Cognition:  WNL  Sleep:   better     Assessment and Plan: Major depressive disorder, recurrent.  Anxiety.  Patient is not interested to go back for [place] evaluation.  She is concerned about finances and trying to fix her teeth.  We talked about adding Lamictal to help the mood and motivation.  After some discussion she agreed to do that.  We will start Lamictal 25 mg daily for 2 week and then 50 mg daily.  I explained that if she develops rash or any itching then she need to call us back and stop the medicine immediately.  Continue Trintellix 20 mg daily and trazodone from 50 mg at bedtime.  She is not interested in therapy.  Recommended to call us back if she is any question or  any concern.  Follow-up in 2 months.  Follow Up Instructions:    I discussed the assessment and treatment plan with the patient. The patient was provided an opportunity to ask questions and all were answered. The patient agreed with the plan and demonstrated an understanding of the instructions.   The patient was advised to call back or seek an in-person evaluation if the symptoms worsen or if the condition fails to improve as anticipated.  I provided 20 minutes of non-face-to-face time  during this encounter.   [name], MD

## 2020-03-03 NOTE — Telephone Encounter (Signed)
Pharmacist from [facility] in [place] called regarding patient's Lamotrigine. He stated that he needs clarification on the script and requested it be resent. Did you want her to take 1/2 tab daily for 2 weeks then the full tab daily? Please advise. Thank you.

## 2020-03-04 MED ORDER — LAMOTRIGINE 25 MG PO TABS: ORAL_TABLET | ORAL | 1 refills | Status: DC

## 2020-03-04 NOTE — Telephone Encounter (Signed)
Done

## 2020-03-18 ENCOUNTER — Other Ambulatory Visit (HOSPITAL_COMMUNITY): Payer: Self-pay | Admitting: *Deleted

## 2020-03-18 DIAGNOSIS — F322 Major depressive disorder, single episode, severe without psychotic features: Secondary | ICD-10-CM

## 2020-03-18 MED ORDER — LAMOTRIGINE 25 MG PO TABS: ORAL_TABLET | ORAL | 1 refills | Status: DC

## 2020-03-31 ENCOUNTER — Other Ambulatory Visit (HOSPITAL_COMMUNITY): Payer: Self-pay | Admitting: Psychiatry

## 2020-03-31 DIAGNOSIS — F322 Major depressive disorder, single episode, severe without psychotic features: Secondary | ICD-10-CM

## 2020-04-05 DIAGNOSIS — M5030 Other cervical disc degeneration, unspecified cervical region: Secondary | ICD-10-CM | POA: Diagnosis not present

## 2020-04-05 DIAGNOSIS — M5136 Other intervertebral disc degeneration, lumbar region: Secondary | ICD-10-CM | POA: Diagnosis not present

## 2020-04-18 ENCOUNTER — Other Ambulatory Visit (HOSPITAL_COMMUNITY): Payer: Self-pay | Admitting: Psychiatry

## 2020-04-18 DIAGNOSIS — F322 Major depressive disorder, single episode, severe without psychotic features: Secondary | ICD-10-CM

## 2020-04-22 DIAGNOSIS — R05 Cough: Secondary | ICD-10-CM | POA: Diagnosis not present

## 2020-04-22 DIAGNOSIS — N644 Mastodynia: Secondary | ICD-10-CM | POA: Diagnosis not present

## 2020-05-03 ENCOUNTER — Telehealth (INDEPENDENT_AMBULATORY_CARE_PROVIDER_SITE_OTHER): Payer: Medicare Other | Admitting: Psychiatry

## 2020-05-03 ENCOUNTER — Other Ambulatory Visit: Payer: Self-pay

## 2020-05-03 ENCOUNTER — Other Ambulatory Visit: Payer: Self-pay | Admitting: Family Medicine

## 2020-05-03 DIAGNOSIS — N644 Mastodynia: Secondary | ICD-10-CM

## 2020-05-03 DIAGNOSIS — F322 Major depressive disorder, single episode, severe without psychotic features: Secondary | ICD-10-CM | POA: Diagnosis not present

## 2020-05-03 MED ORDER — VORTIOXETINE HBR 20 MG PO TABS: 20.0000 mg | ORAL_TABLET | Freq: Every day | ORAL | 0 refills | Status: DC

## 2020-05-03 MED ORDER — TRAZODONE HCL 150 MG PO TABS: 150.0000 mg | ORAL_TABLET | Freq: Every day | ORAL | 0 refills | Status: DC

## 2020-05-03 NOTE — Progress Notes (Signed)
Virtual Visit via Telephone Note  I connected with Hayley [name] on 05/03/20 at  3:40 PM EDT by telephone and verified that I am speaking with the correct person using two identifiers.  Location: Patient: home Provider: home office   I discussed the limitations, risks, security and privacy concerns of performing an evaluation and management service by telephone and the availability of in person appointments. I also discussed with the patient that there may be a patient responsible charge related to this service. The patient expressed understanding and agreed to proceed.   History of Present Illness: Patient is evaluated by phone session.  She started taking Lamictal and she felt better in her mood and energy level but recently noticed that she has rashes on her legs and back of the knee.  Patient mentioned these rashes started this week not sure what causes.  The only medicine she started new is Lamictal.  She continues to have a lot of financial issues because she is behind her mortgage payment and she may need a car since her car is very old and she is afraid it will stop working.  She is sleeping okay.  She admitted fatigue, no motivation and continues to have negative and ruminative thoughts but denies any suicidal thoughts.  Despite having a lot of financial issues she is still believe that she will get better.  She has to think about her future as downsizing her place.  She denies drinking or using any illegal substances.  She is reconsidering TMS but not sure.  She is taking Trintellix and trazodone.  Her sleep is okay.   Past Psychiatric History:Reviewed. H/Odepression and anxiety.H/Osuicidal attempt on Percocet in March 2019.TookEffexorin her 50sbut stopped after working for a while. Tried Zoloft, Paxil and Wellbutrin fromPCP but did not work. Did IOP,PHPand givenProzac butnoimprovement.Tried lexapro caused GI s/e.    Psychiatric Specialty Exam: Physical Exam  Review  of Systems  Skin: Positive for rash.       Rash on leg    Last menstrual period 09/18/1979.There is no height or weight on file to calculate BMI.  General Appearance: NA  Eye Contact:  NA  Speech:  Slow  Volume:  Decreased  Mood:  Dysphoric  Affect:  NA  Thought Process:  Goal Directed  Orientation:  Full (Time, Place, and Person)  Thought Content:  Rumination  Suicidal Thoughts:  No  Homicidal Thoughts:  No  Memory:  Immediate;   Good Recent;   Good Remote;   Good  Judgement:  Intact  Insight:  Present  Psychomotor Activity:  NA  Concentration:  Concentration: Fair and Attention Span: Good  Recall:  Good  Fund of Knowledge:  Good  Language:  Good  Akathisia:  No  Handed:  Right  AIMS (if indicated):     Assets:  Communication Skills Desire for Improvement Housing Resilience  ADL's:  Intact  Cognition:  WNL  Sleep:   ok      Assessment and Plan: Major depressive disorder, recurrent.  Anxiety.  Patient reported some improvement with the Lamictal but unfortunately she had a rash.  It is unclear if the Lamictal is causing rash but I recommend that she should stop the Lamictal to see if the rash go away.  If rash did not go away then she need to see dermatologist.  I encouraged to continue Trintellix and trazodone.  She is thinking [place] which had helped her in the past and I agree that she should consider doing [place] again.  We talked about financial issues and patient think about downsizing since she is behind her mortgage.  I discussed safety concern that anytime having active suicidal thoughts or homicidal thought that she need to call 911 or go to local emergency room.  Patient is not interested in therapy at this time but agree to give [place] a call if needed in the future.  Follow-up 4 to 6 weeks.  We may consider restarting Lamictal if rash is not related with the Lamictal.    Follow Up Instructions:    I discussed the assessment and treatment plan with the patient. The  patient was provided an opportunity to ask questions and all were answered. The patient agreed with the plan and demonstrated an understanding of the instructions.   The patient was advised to call back or seek an in-person evaluation if the symptoms worsen or if the condition fails to improve as anticipated.  I provided 20 minutes of non-face-to-face time during this encounter.   [name], MD

## 2020-05-09 DIAGNOSIS — Z0000 Encounter for general adult medical examination without abnormal findings: Secondary | ICD-10-CM | POA: Diagnosis not present

## 2020-05-09 DIAGNOSIS — E039 Hypothyroidism, unspecified: Secondary | ICD-10-CM | POA: Diagnosis not present

## 2020-05-09 DIAGNOSIS — E119 Type 2 diabetes mellitus without complications: Secondary | ICD-10-CM | POA: Diagnosis not present

## 2020-05-09 DIAGNOSIS — Z1211 Encounter for screening for malignant neoplasm of colon: Secondary | ICD-10-CM | POA: Diagnosis not present

## 2020-05-09 DIAGNOSIS — Z5181 Encounter for therapeutic drug level monitoring: Secondary | ICD-10-CM | POA: Diagnosis not present

## 2020-05-09 DIAGNOSIS — F331 Major depressive disorder, recurrent, moderate: Secondary | ICD-10-CM | POA: Diagnosis not present

## 2020-05-09 DIAGNOSIS — R399 Unspecified symptoms and signs involving the genitourinary system: Secondary | ICD-10-CM | POA: Diagnosis not present

## 2020-05-17 DIAGNOSIS — M62830 Muscle spasm of back: Secondary | ICD-10-CM | POA: Diagnosis not present

## 2020-05-17 DIAGNOSIS — M5030 Other cervical disc degeneration, unspecified cervical region: Secondary | ICD-10-CM | POA: Diagnosis not present

## 2020-05-17 DIAGNOSIS — M5416 Radiculopathy, lumbar region: Secondary | ICD-10-CM | POA: Diagnosis not present

## 2020-05-17 DIAGNOSIS — M5136 Other intervertebral disc degeneration, lumbar region: Secondary | ICD-10-CM | POA: Diagnosis not present

## 2020-05-18 ENCOUNTER — Ambulatory Visit
Admission: RE | Admit: 2020-05-18 | Discharge: 2020-05-18 | Disposition: A | Discharge status: Home / Self Care | Payer: Medicare Other | Source: Ambulatory Visit | Attending: Family Medicine | Admitting: Family Medicine

## 2020-05-18 ENCOUNTER — Ambulatory Visit: Payer: Medicare Other

## 2020-05-18 ENCOUNTER — Other Ambulatory Visit: Payer: Self-pay

## 2020-05-18 ENCOUNTER — Other Ambulatory Visit: Payer: Self-pay | Admitting: Family Medicine

## 2020-05-18 ENCOUNTER — Other Ambulatory Visit (HOSPITAL_COMMUNITY): Payer: Self-pay | Admitting: Psychiatry

## 2020-05-18 DIAGNOSIS — F322 Major depressive disorder, single episode, severe without psychotic features: Secondary | ICD-10-CM

## 2020-05-18 DIAGNOSIS — N644 Mastodynia: Secondary | ICD-10-CM

## 2020-05-18 DIAGNOSIS — R928 Other abnormal and inconclusive findings on diagnostic imaging of breast: Secondary | ICD-10-CM | POA: Diagnosis not present

## 2020-05-24 ENCOUNTER — Telehealth: Payer: Self-pay | Admitting: Oncology

## 2020-05-24 ENCOUNTER — Inpatient Hospital Stay: Payer: Medicare Other | Attending: Oncology

## 2020-05-24 NOTE — Telephone Encounter (Signed)
Called pt per 9/7 sch msg- no answer. Left message for patient to call back to reschedule.

## 2020-05-30 NOTE — Progress Notes (Signed)
Thank you [facility]  [phone] [phone]     ID: Hayley [name] DOB: 07-21-1946  MR#: [id number]  KVQ#:[id number]  Patient Care Team: [name], MD as PCP - General (Family Medicine) Juanita Craver, MD as Consulting Physician (Gastroenterology) Daryll Brod, MD as Consulting Physician (Orthopedic Surgery) Monna Fam, MD as Consulting Physician (Ophthalmology) Jacalyn Lefevre, MD as Consulting Physician (Pulmonary Disease) OTHER MD: Gaynelle Arabian MD   CHIEF COMPLAINT: coagulopathy  CURRENT TREATMENT: Apixaban   INTERVAL HISTORY: Hayley [name] was scheduled today for follow-up and treatment of her coagulopathy, however she did not show..   Since her last visit, she presented with achy, diffuse right breast pain. She also noted that her daughter was recently diagnosed with breast cancer. Hayley [name] underwent bilateral diagnostic mammography with tomography at [facility] on 05/18/2020 showing: breast density category B; no evidence of malignancy in either breast.    REVIEW OF SYSTEMS: Hayley [name]    HISTORY OF PRESENT ILLNESS: From the 06/05/2015 consult note:  "Ms Marrs underwent laparoscopic appendectomy under Dr Johney [place] 11/27/2014 for what was likely a perforated appendix with phlegmon formation. On 12/06/2011 she was noted to be hypoxic and a chest CT/angio was obtained, showing multiple small bilateral PEs. LE dopplers that day were negative. UE dopplers the next day showed only a R cephalic v superficial thrombus.   The patient was started on warfarin, continued for one year. Repeat CT/angio 12/22/2013 showed resolution of the earlier clot.  On 12/06/2014 the patient underwent R TKR. On 12/09/2014 she was noted to be SOB and CT/angio documented bilateral PEs. She was started on Xarelto, later changed by her PCP to Pradaxa. Repeat CT angios 05/15/2014 and 06/03/2015 showed resolution of the earlier clots."  Her subsequent history is as detailed  below   PAST MEDICAL HISTORY: Past Medical History:  Diagnosis Date  . Anemia   . Anxiety   . Bronchitis    hx of   . Clotting disorder ([place]) 02-17-14   S/p appendectomy-developed Pulmonary emboli-tx. warfarin-d/c 1 yr ago.  . Colon polyps   . Depression   . Diabetes mellitus without complication ([place])    "borderline"- oral med  . Disorder of vocal cords    spasmotic dysphonia ,02-17-14 has" whispery voice-low tone"  . Hyperthyroidism   . Kidney stones   . Migraines   . Nausea Nov 15, 2014   cycle of migraine headaches and nausea  . Neuromuscular disorder ([place])    hands and throat"spasmodic dysphonia", tremors - thimb and forefinger   . Obesity, Class III, BMI 40-49.9 (morbid obesity) ([place]) 04/27/2010   02-17-14 reports some weight loss- intentional  . Osteoarthritis    in back(chronic pain)  . Pelvic pain    02-17-14 "states thinks its back related on left groin"  . Pulmonary emboli [facility])    s/p Appendectomy '13 [facility]  . Reflux   . Right knee pain   . Sleep apnea    cpap - settings at 4   . Toxic goiter     PAST SURGICAL HISTORY: Past Surgical History:  Procedure Laterality Date  . ABDOMINAL HYSTERECTOMY    . APPENDECTOMY     3'13 [facility] s/p developed  Pulmonary Emboli  . AUGMENTATION MAMMAPLASTY  09/17/1968  . BOTOX INJECTION     for migranes  . CATARACT EXTRACTION, BILATERAL Bilateral 03/2013   . CHOLECYSTECTOMY    . HAND SURGERY  2004   both hands  . KIDNEY STONE SURGERY    .  KNEE ARTHROSCOPY  1998   right  . LAPAROSCOPIC APPENDECTOMY  11/27/2011   Procedure: APPENDECTOMY LAPAROSCOPIC;  Surgeon: Adin Hector, MD;  Location: [facility] ORS;  Service: General;  Laterality: N/A;  . SPINAL CORD STIMULATOR IMPLANT  06/04/2016   [facility] Alfonse [facility]  . TOTAL KNEE ARTHROPLASTY Right 12/06/2014   Procedure: RIGHT TOTAL KNEE ARTHROPLASTY;  Surgeon: Gaynelle Arabian, MD;  Location: [facility];  Service: Orthopedics;  Laterality: Right;  . TOTAL KNEE REVISION Left  02/24/2014   Procedure: LEFT TOTAL KNEE REVISION;  Surgeon: Gearlean Alf, MD;  Location: [facility];  Service: Orthopedics;  Laterality: Left;  . vocal cord surgery  02-17-14   01-12-14 done in [place](s/p selective denervation/reinnervation recurrent laryngeal nerve surgery)  . zenkers diverticulum      FAMILY HISTORY Family History  Problem Relation Age of Onset  . Heart disease Mother   . Pneumonia Father   . Cirrhosis Father   . Hypertension Father   . Alcoholism Father   . Heart attack Paternal Grandfather   . Heart attack Maternal Grandfather   . Heart attack Paternal Grandmother   . Heart attack Maternal Grandmother   . Breast cancer Daughter     GYNECOLOGIC HISTORY:  Patient's last menstrual period was 09/18/1979 (approximate).   SOCIAL HISTORY: (Updated September 2020). 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  Hayley [name] currently lives by herself.  The patient has 2 children from an earlier marriage, Hayley [name], who lives in [place], and a daughter Hayley [name], who manages a Building surveyor. The patient has one grandchild of her own, 18 with Arnell Sieving (who had 3 children from a prior marriage)     HEALTH MAINTENANCE: Social History   Tobacco Use  . Smoking status: Former Smoker    Quit date: 01/03/1982    Years since quitting: 38.4  . Smokeless tobacco: Never Used  Vaping Use  . Vaping Use: Never used  Substance Use Topics  . Alcohol use: No  . Drug use: No     Colonoscopy:  PAP:  Bone density:  Lipid panel:  Allergies  Allergen Reactions  . Bee Venom Anaphylaxis  . Morphine And Related   . Tape Rash    Rash - only use paper tape  . Etodolac Diarrhea and Nausea And Vomiting  . Morphine Other (See Comments)    Hallucinations  . Other Itching and Swelling    Bee venom  . Poison Ivy Extract  [Poison Ivy Extract] Itching and Swelling  . Propoxyphene Nausea  And Vomiting  . Propoxyphene N-Acetaminophen Nausea And Vomiting  . Penicillins Rash    Has patient had a PCN reaction causing immediate rash, facial/tongue/throat swelling, SOB or lightheadedness with hypotension: Yes Has patient had a PCN reaction causing severe rash involving mucus membranes or skin necrosis: No Has patient had a PCN reaction that required hospitalization No Has patient had a PCN reaction occurring within the last 10 years: No If all of the above answers are "NO", then may proceed with Cephalosporin use.     Current Outpatient Medications  Medication Sig Dispense Refill  . apixaban (ELIQUIS) 2.5 MG TABS tablet Take 1 tablet (2.5 mg total) by mouth 2 (two) times daily. 180 tablet 4  . cyanocobalamin (,VITAMIN B-12,) 1000 MCG/ML injection Inject 1 mL into the skin every 30 (thirty) days.  11  .  diclofenac (FLECTOR) 1.3 % PTCH Place 1 patch onto the skin daily. 20 patch 1  . Erenumab-aooe (AIMOVIG) 70 MG/ML SOAJ Inject 70 mg into the skin every 30 (thirty) days. 1 pen 11  . hydrOXYzine (ATARAX/VISTARIL) 25 MG tablet Take 1 tablet (25 mg total) by mouth 3 (three) times daily as needed for anxiety, itching or nausea. 30 tablet 0  . hyoscyamine (ANASPAZ) 0.125 MG TBDP disintergrating tablet Place 0.125 mg under the tongue every 4 (four) hours as needed for bladder spasms or cramping.     . lamoTRIgine (LAMICTAL) 25 MG tablet Take 1/2 tab (12.5mg ) by mouth daily for 2 weeks and then increase to take 1 full tablet (25mg ) by mouth daily. (Patient not taking: Reported on 05/03/2020) 30 tablet 1  . levothyroxine (SYNTHROID, LEVOTHROID) 25 MCG tablet Take 25 mcg by mouth every morning.     . metoCLOPramide (REGLAN) 5 MG tablet Take 5 mg by mouth 3 (three) times daily as needed for nausea.    . mometasone-formoterol (DULERA) 100-5 MCG/ACT AERO Inhale 2 puffs into the lungs 2 (two) times daily as needed for wheezing or shortness of breath.     [name] MYRBETRIQ 50 MG TB24 tablet Take 50 mg by  mouth at bedtime.     . OnabotulinumtoxinA (BOTOX IM) Inject 200 Units into the muscle every 3 (three) months.    . propranolol (INDERAL) 60 MG tablet Take 60 mg by mouth 2 (two) times daily.    . simethicone (MYLICON) 80 MG chewable tablet Chew 1 tablet (80 mg total) by mouth 4 (four) times daily as needed for flatulence (belching). 30 tablet 0  . sitaGLIPtin (JANUVIA) 25 MG tablet Take 25 mg by mouth daily.    . traZODone (DESYREL) 150 MG tablet Take 1 tablet (150 mg total) by mouth at bedtime. 90 tablet 0  . valACYclovir (VALTREX) 1000 MG tablet Take 2,000 mg by mouth 2 (two) times daily as needed (Fever blisters). Reported on 01/10/2016  2  . vortioxetine HBr (TRINTELLIX) 20 MG TABS tablet Take 1 tablet (20 mg total) by mouth daily. 90 tablet 0   No current facility-administered medications for this visit.    OBJECTIVE:   There were no vitals filed for this visit. Wt Readings from Last 3 Encounters:  10/26/19 205 lb (93 kg)  06/01/19 209 lb 1.6 oz (94.8 kg)  05/04/19 206 lb 8 oz (93.7 kg)   There is no height or weight on file to calculate BMI.  (Was 39.47 August 2017  ECOG FS:   LAB RESULTS:  CMP     Component Value Date/Time   NA 139 04/01/2019 0407   NA 138 12/12/2016 1105   K 3.6 04/01/2019 0407   K 4.6 12/12/2016 1105   CL 102 04/01/2019 0407   CO2 28 04/01/2019 0407   CO2 25 12/12/2016 1105   GLUCOSE 180 (H) 04/01/2019 0407   GLUCOSE 234 (H) 12/12/2016 1105   BUN 7 (L) 04/01/2019 0407   BUN 8.0 12/12/2016 1105   CREATININE 0.71 04/04/2019 0359   CREATININE 0.84 12/25/2017 1217   CREATININE 0.8 12/12/2016 1105   CALCIUM 8.7 (L) 04/01/2019 0407   CALCIUM 9.4 12/12/2016 1105   PROT 6.9 03/31/2019 1611   PROT 7.5 12/12/2016 1105   ALBUMIN 3.7 03/31/2019 1611   ALBUMIN 3.7 12/12/2016 1105   AST 15 03/31/2019 1611   AST 17 12/25/2017 1217   AST 14 12/12/2016 1105   ALT 8 03/31/2019 1611   ALT 14 12/25/2017 1217  ALT 16 12/12/2016 1105   ALKPHOS 54  03/31/2019 1611   ALKPHOS 102 12/12/2016 1105   BILITOT 0.8 03/31/2019 1611   BILITOT 0.3 12/25/2017 1217   BILITOT 0.34 12/12/2016 1105   GFRNONAA >60 04/04/2019 0359   GFRNONAA >60 12/25/2017 1217   GFRAA >60 04/04/2019 0359   GFRAA >60 12/25/2017 1217    INo results found for: SPEP, UPEP  Lab Results  Component Value Date   WBC 4.7 04/02/2019   NEUTROABS 2.4 04/01/2019   HGB 12.6 04/02/2019   HCT 41.0 04/02/2019   MCV 89.1 04/02/2019   PLT 211 04/02/2019      Chemistry      Component Value Date/Time   NA 139 04/01/2019 0407   NA 138 12/12/2016 1105   K 3.6 04/01/2019 0407   K 4.6 12/12/2016 1105   CL 102 04/01/2019 0407   CO2 28 04/01/2019 0407   CO2 25 12/12/2016 1105   BUN 7 (L) 04/01/2019 0407   BUN 8.0 12/12/2016 1105   CREATININE 0.71 04/04/2019 0359   CREATININE 0.84 12/25/2017 1217   CREATININE 0.8 12/12/2016 1105      Component Value Date/Time   CALCIUM 8.7 (L) 04/01/2019 0407   CALCIUM 9.4 12/12/2016 1105   ALKPHOS 54 03/31/2019 1611   ALKPHOS 102 12/12/2016 1105   AST 15 03/31/2019 1611   AST 17 12/25/2017 1217   AST 14 12/12/2016 1105   ALT 8 03/31/2019 1611   ALT 14 12/25/2017 1217   ALT 16 12/12/2016 1105   BILITOT 0.8 03/31/2019 1611   BILITOT 0.3 12/25/2017 1217   BILITOT 0.34 12/12/2016 1105       No results found for: LABCA2  No components found for: LABCA125  No results for input(s): INR in the last 168 hours.  Urinalysis    Component Value Date/Time   COLORURINE YELLOW 03/31/2019 1551   APPEARANCEUR CLEAR 03/31/2019 1551   APPEARANCEUR CLOUDY 12/19/2014 0730   LABSPEC 1.008 03/31/2019 1551   LABSPEC 1.025 12/19/2014 0730   PHURINE 7.0 03/31/2019 1551   GLUCOSEU NEGATIVE 03/31/2019 1551   GLUCOSEU see comment 12/19/2014 0730   HGBUR NEGATIVE 03/31/2019 1551   BILIRUBINUR NEGATIVE 03/31/2019 1551   BILIRUBINUR n 05/08/2016 1506   BILIRUBINUR see comment 12/19/2014 0730   KETONESUR NEGATIVE 03/31/2019 1551   PROTEINUR  NEGATIVE 03/31/2019 1551   UROBILINOGEN negative 05/08/2016 1506   UROBILINOGEN 1.0 11/30/2014 1215   NITRITE NEGATIVE 03/31/2019 1551   LEUKOCYTESUR NEGATIVE 03/31/2019 1551   LEUKOCYTESUR see comment 12/19/2014 0730    STUDIES: MM DIAG BREAST W/IMPLANT TOMO BILATERAL  Addendum Date: 05/24/2020   ADDENDUM REPORT: 05/24/2020 07:46 EXAM: DIGITAL DIAGNOSTIC BILATERAL MAMMOGRAM WITH IMPLANTS, CAD AND TOMO Electronically Signed   By: Kristopher Oppenheim M.D.   On: 05/24/2020 07:46   Result Date: 05/24/2020 CLINICAL DATA:  74 year old female with constant achy, diffuse right breast pain for 1-2 months. Patient's daughter was recently diagnosed with breast cancer and is currently undergoing treatment. EXAM: DIGITAL DIAGNOSTIC RIGHT MAMMOGRAM WITH IMPLANTS, CAD AND TOMO The patient has prepectoral implants. Standard and implant displaced views were performed. COMPARISON:  Previous exam(s). ACR Breast Density Category b: There are scattered areas of fibroglandular density. FINDINGS: No suspicious masses, distortions or calcifications are identified in either breast. The parenchymal pattern is stable. Mammographic images were processed with CAD. IMPRESSION: No mammographic evidence of malignancy. RECOMMENDATION: 1. Clinical follow-up recommended for the diffusely painful area of concern in the right breast. Any further workup should be based on  clinical grounds. Benign causes of breast pain, and possible remedies, were discussed with the patient. Patient was encouraged to follow-up with referring physician if pain became localized and persistent or if a palpable lump/mass developed. 2.  Screening mammogram in one year.(Code:SM-B-01Y) I have discussed the findings and recommendations with the patient. If applicable, a reminder letter will be sent to the patient regarding the next appointment. BI-RADS CATEGORY  1: Negative. Electronically Signed: By: Kristopher Oppenheim M.D. On: 05/18/2020 15:55    ASSESSMENT: 74 y.o.  [place] woman with a history of recurrent PE as follows:  (1) multiple low-volume PE noted on chest CT/angio 12/06/2014, 10 days after laparoscopic appendectomy for a perforated appendix with phlegmon (a) bilateral LE dopplers 12/06/2014 negative (b) bilateral UE dopplers 12/07/2014 showed superficial R cephalic v clot (c) treatment: warfarin x one year, monitored through [facility] (d) repeat CT angio 12/23/2015 showed prior PEs cleared; also CT/angio 05/15/2014 negative  (2) bilateral PEs documented by Ct/angio03/24/2016, day 3 post R total knee (a) started on rivaroxaban/ Xarelto, switched by PCP to dabigratan/ Pradaxa, discontinued May 2017 because of cost issues (b) repeat CT/angio 06/03/2015 shows resolution of prior PE  (c) started on apixaban 5 mg daily with symptoms suggestive of stroke, but negative workup, July 2017  (d) apixaban dose decreased to 2.5 mg daily given history of falls but persistently elevated d-dimer   (i) GFR > 60 04/01/2019  (3) hypercoagulable panel 06/05/2015 including a homocystine level, a screen for the lupus anticoagulant, beta 2 glycoprotein studies, anti-cardiolipin antibodies, and studies for the factor V Leiden and prothrombin gene mutation found no actionable abnormalities  (a) the anticardiolipin IgM antibody 06/05/2015 was 34 ("low medium positive"), repeat 07/18/2015 was 38   PLAN: Giannina did not show for 05/31/2020 visit.  A follow-up letter has been sent  Hayley [name], Virgie Dad, MD  05/31/20 5:48 PM Medical Oncology and Hematology [facility] [place], [place] [address] Tel. [phone]    Fax. [phone]   I, Wilburn Mylar, am acting as scribe for Dr. Virgie Dad. Hayley [name].  I, Lurline Del MD, have reviewed the above documentation for accuracy and completeness, and I agree with the above.    *Total Encounter Time as defined  by the Centers for Medicare and Medicaid Services includes, in addition to the face-to-face time of a patient visit (documented in the note above) non-face-to-face time: obtaining and reviewing outside history, ordering and reviewing medications, tests or procedures, care coordination (communications with other health care professionals or caregivers) and documentation in the medical record.

## 2020-05-31 ENCOUNTER — Other Ambulatory Visit (HOSPITAL_COMMUNITY): Payer: Self-pay | Admitting: Psychiatry

## 2020-05-31 ENCOUNTER — Inpatient Hospital Stay (HOSPITAL_BASED_OUTPATIENT_CLINIC_OR_DEPARTMENT_OTHER): Payer: Medicare Other | Admitting: Oncology

## 2020-05-31 ENCOUNTER — Encounter: Payer: Self-pay | Admitting: Oncology

## 2020-05-31 DIAGNOSIS — F322 Major depressive disorder, single episode, severe without psychotic features: Secondary | ICD-10-CM

## 2020-05-31 DIAGNOSIS — D689 Coagulation defect, unspecified: Secondary | ICD-10-CM

## 2020-06-03 DIAGNOSIS — Z1211 Encounter for screening for malignant neoplasm of colon: Secondary | ICD-10-CM | POA: Diagnosis not present

## 2020-06-13 ENCOUNTER — Telehealth (INDEPENDENT_AMBULATORY_CARE_PROVIDER_SITE_OTHER): Payer: Medicare Other | Admitting: Psychiatry

## 2020-06-13 ENCOUNTER — Encounter (HOSPITAL_COMMUNITY): Payer: Self-pay | Admitting: Psychiatry

## 2020-06-13 ENCOUNTER — Other Ambulatory Visit: Payer: Self-pay

## 2020-06-13 ENCOUNTER — Other Ambulatory Visit (HOSPITAL_COMMUNITY): Payer: Self-pay | Admitting: Psychiatry

## 2020-06-13 VITALS — Wt 190.0 lb

## 2020-06-13 DIAGNOSIS — F322 Major depressive disorder, single episode, severe without psychotic features: Secondary | ICD-10-CM | POA: Diagnosis not present

## 2020-06-13 DIAGNOSIS — F419 Anxiety disorder, unspecified: Secondary | ICD-10-CM | POA: Diagnosis not present

## 2020-06-13 MED ORDER — LAMOTRIGINE 25 MG PO TABS: ORAL_TABLET | ORAL | 1 refills | Status: DC

## 2020-06-13 NOTE — Progress Notes (Signed)
Virtual Visit via Telephone Note  I connected with Hayley [name] on 06/13/20 at  3:40 PM EDT by telephone and verified that I am speaking with the correct person using two identifiers.  Location: Patient: home Provider: home office   I discussed the limitations, risks, security and privacy concerns of performing an evaluation and management service by telephone and the availability of in person appointments. I also discussed with the patient that there may be a patient responsible charge related to this service. The patient expressed understanding and agreed to proceed.   History of Present Illness: Patient is evaluated by phone session.  On the last visit we recommended to stop the Lamictal because she was complaining of rashes on her legs and back of the knee.  Even though she was feeling better with her energy and mood but due to rash she was told to stop.  Patient has not seen any improvement with the rash and she is still have the rash on those places.  Not she believes that she may have rash because of the animals.  She has cats and dogs.  She noticed since stopped the Lamictal her energy level is go down.  She is more depressed, anxious and trouble sleeping.  She had ruminative thoughts and some time she feels no motivation, fatigue and negative thinking.  She had passive and fleeting suicidal thoughts but no plan or any intent.  She had a lot of financial issues.  She was thinking about downsizing but whenever she look at the places she feels nothing is affordable.  Her daughter diagnosed with breast cancer and recently she finished the treatment and she is pleased that she is recovering.  She was also thinking about TMS but recently having issues with the dentures and may require surgery she is not sure if she can go through [place] again.  She denies any hallucination, paranoia or any anger.  She cannot afford therapy.   Past Psychiatric History:Reviewed. H/Odepression and  anxiety.H/Osuicidal attempt on Percocet in March 2019.TookEffexorin her 50sbut stopped after working for a while. Tried Zoloft, Paxil and Wellbutrin fromPCP but did not work. Did IOP,PHPand givenProzac butnoimprovement.Tried lexapro caused GI s/e.  Psychiatric Specialty Exam: Physical Exam  Review of Systems  Weight 190 lb (86.2 kg), last menstrual period 09/18/1979.There is no height or weight on file to calculate BMI.  General Appearance: NA  Eye Contact:  NA  Speech:  Slow  Volume:  Decreased  Mood:  Anxious and Dysphoric  Affect:  NA  Thought Process:  Goal Directed  Orientation:  Full (Time, Place, and Person)  Thought Content:  Rumination  Suicidal Thoughts:  sometimes suicidal thoughts but no plan  Homicidal Thoughts:  No  Memory:  Immediate;   Good Recent;   Good Remote;   Fair  Judgement:  Intact  Insight:  Present  Psychomotor Activity:  NA  Concentration:  Concentration: Fair and Attention Span: Fair  Recall:  Good  Fund of Knowledge:  Good  Language:  Good  Akathisia:  No  Handed:  Right  AIMS (if indicated):     Assets:  Communication Skills Desire for Improvement Housing Resilience  ADL's:  Intact  Cognition:  WNL  Sleep:   ok      Assessment and Plan: Major depressive disorder, recurrent.  Anxiety.  Recommend to restart Lamictal since rash is unrelated to the Lamictal.  Not patient believes it could be due to her cats and dogs.  I recommend to see dermatology and  resume Lamictal 12.5 mg daily for 2 weeks and then 25 mg daily.  Patient recall it helped her mood, energy and motivation.  She agreed with the plan.  At this time she is not considering [place] because of her denture issues but once her dental issues are resolved then she will consider.  She is hoping restarting Lamictal helped her mood.  Discussed safety concern that anytime having active suicidal thoughts or homicidal her then she need to call 911 or go to local emergency room.   Patient like to have a follow-up in 4 weeks.  Patient was given Trintellix and trazodone on August 17 with a 90-day supply and she should have enough pills until her next appointment.  Follow-up in 4 weeks.  Follow Up Instructions:    I discussed the assessment and treatment plan with the patient. The patient was provided an opportunity to ask questions and all were answered. The patient agreed with the plan and demonstrated an understanding of the instructions.   The patient was advised to call back or seek an in-person evaluation if the symptoms worsen or if the condition fails to improve as anticipated.  I provided 19 minutes of non-face-to-face time during this encounter.   [name], MD

## 2020-06-17 ENCOUNTER — Other Ambulatory Visit (HOSPITAL_COMMUNITY): Payer: Self-pay

## 2020-06-17 ENCOUNTER — Telehealth (HOSPITAL_COMMUNITY): Payer: Self-pay

## 2020-06-17 ENCOUNTER — Other Ambulatory Visit (HOSPITAL_COMMUNITY): Payer: Self-pay | Admitting: Psychiatry

## 2020-06-17 DIAGNOSIS — F322 Major depressive disorder, single episode, severe without psychotic features: Secondary | ICD-10-CM

## 2020-06-17 MED ORDER — TRAZODONE HCL 150 MG PO TABS: 150.0000 mg | ORAL_TABLET | Freq: Every day | ORAL | 0 refills | Status: DC

## 2020-06-17 NOTE — Telephone Encounter (Signed)
Received a call from patient stating that she couldn't get her Trazodone 150mg . I called pharmacy and they stated that they never received the script that doctor had sent in so I re-sent the script to [facility] in [place]

## 2020-07-01 DIAGNOSIS — M5412 Radiculopathy, cervical region: Secondary | ICD-10-CM | POA: Diagnosis not present

## 2020-07-01 DIAGNOSIS — M5030 Other cervical disc degeneration, unspecified cervical region: Secondary | ICD-10-CM | POA: Diagnosis not present

## 2020-07-01 DIAGNOSIS — M5417 Radiculopathy, lumbosacral region: Secondary | ICD-10-CM | POA: Diagnosis not present

## 2020-07-01 DIAGNOSIS — M5136 Other intervertebral disc degeneration, lumbar region: Secondary | ICD-10-CM | POA: Diagnosis not present

## 2020-07-05 ENCOUNTER — Other Ambulatory Visit (HOSPITAL_COMMUNITY): Payer: Self-pay | Admitting: Psychiatry

## 2020-07-05 DIAGNOSIS — F322 Major depressive disorder, single episode, severe without psychotic features: Secondary | ICD-10-CM

## 2020-07-06 ENCOUNTER — Other Ambulatory Visit: Payer: Self-pay

## 2020-07-06 ENCOUNTER — Encounter (HOSPITAL_COMMUNITY): Payer: Self-pay | Admitting: Psychiatry

## 2020-07-06 ENCOUNTER — Telehealth (INDEPENDENT_AMBULATORY_CARE_PROVIDER_SITE_OTHER): Payer: Medicare Other | Admitting: Psychiatry

## 2020-07-06 DIAGNOSIS — F322 Major depressive disorder, single episode, severe without psychotic features: Secondary | ICD-10-CM | POA: Diagnosis not present

## 2020-07-06 MED ORDER — VORTIOXETINE HBR 20 MG PO TABS: 20.0000 mg | ORAL_TABLET | Freq: Every day | ORAL | 0 refills | Status: DC

## 2020-07-06 MED ORDER — TRAZODONE HCL 150 MG PO TABS: 150.0000 mg | ORAL_TABLET | Freq: Every day | ORAL | 0 refills | Status: DC

## 2020-07-06 MED ORDER — LAMOTRIGINE 25 MG PO TABS: 50.0000 mg | ORAL_TABLET | Freq: Every day | ORAL | 1 refills | Status: DC

## 2020-07-06 NOTE — Progress Notes (Signed)
Virtual Visit via Telephone Note  I connected with Hayley [name] on 07/06/20 at  3:40 PM EDT by telephone and verified that I am speaking with the correct person using two identifiers.  Location: Patient: home Provider: home office   I discussed the limitations, risks, security and privacy concerns of performing an evaluation and management service by telephone and the availability of in person appointments. I also discussed with the patient that there may be a patient responsible charge related to this service. The patient expressed understanding and agreed to proceed.   History of Present Illness: Patient is evaluated by phone session.  On the last visit we started Lamictal very low-dose because she had a rash but she believes it was due to cats and dogs.  She is taking 25 mg however has not seen a huge improvement with the Lamictal.  So far she tolerating well and reported no rash or any itching.  Since taking the Lamictal she has only once suicidal thoughts but she has no plan or any intent.  She is concerned about her daughter who has a cancer in her right breast and she reported not doing well.  Patient told that she may die because things are not going well.  She admitted has no motivation to do things.  Her sleep is fair.  She is working with the [employer] to resolve the issue otherwise she made moved to downsize.  She is taking Trintellix, trazodone.  She is feels sleep is better with the trazodone.  She denies any paranoia or any hallucination.  Past Psychiatric History:Reviewed. H/Odepression and anxiety.H/Osuicidal attempt on Percocet in March 2019.TookEffexorin her 50sbut stopped after working for a while. Tried Zoloft, Paxil and Wellbutrin fromPCP but did not work. Did IOP,PHPand givenProzac butnoimprovement.Tried lexapro caused GI s/e.   Psychiatric Specialty Exam: Physical Exam  Review of Systems  Last menstrual period 09/18/1979.There is no height or  weight on file to calculate BMI.  General Appearance: NA  Eye Contact:  NA  Speech:  Slow  Volume:  Decreased  Mood:  Dysphoric  Affect:  NA  Thought Process:  Descriptions of Associations: Intact  Orientation:  Full (Time, Place, and Person)  Thought Content:  Rumination  Suicidal Thoughts:  No  Homicidal Thoughts:  No  Memory:  Immediate;   Good Recent;   Good Remote;   Good  Judgement:  Intact  Insight:  Present  Psychomotor Activity:  NA  Concentration:  Concentration: Fair and Attention Span: Fair  Recall:  [employer] of Knowledge:  Good  Language:  Good  Akathisia:  No  Handed:  Right  AIMS (if indicated):     Assets:  Communication Skills Desire for Improvement Housing Resilience  ADL's:  Intact  Cognition:  WNL  Sleep:   fair      Assessment and Plan: Major depressive disorder, recurrent.  Anxiety.  Patient tolerating Lamictal without any side effects.  I recommend to increase Lamictal to 50 mg and she do not have any rash and she believes rash happen because of cats and dogs which is now resolved.  She like to keep Trintellix and trazodone but wondering if the trazodone does need to increase because some nights she has difficulty sleeping.  I recommend to try first increase Lamictal and if it did not help then we may consider increasing the trazodone.  Patient cannot afford therapy.  Recommend to follow-up in 6 weeks unless she has any question concerns or if she feels worsening  of the symptom.  Follow Up Instructions:    I discussed the assessment and treatment plan with the patient. The patient was provided an opportunity to ask questions and all were answered. The patient agreed with the plan and demonstrated an understanding of the instructions.   The patient was advised to call back or seek an in-person evaluation if the symptoms worsen or if the condition fails to improve as anticipated.  I provided 17 minutes of non-face-to-face time during this  encounter.   [name], MD

## 2020-07-15 DIAGNOSIS — J441 Chronic obstructive pulmonary disease with (acute) exacerbation: Secondary | ICD-10-CM | POA: Diagnosis not present

## 2020-07-15 DIAGNOSIS — R059 Cough, unspecified: Secondary | ICD-10-CM | POA: Diagnosis not present

## 2020-07-28 DIAGNOSIS — G894 Chronic pain syndrome: Secondary | ICD-10-CM | POA: Diagnosis not present

## 2020-07-29 ENCOUNTER — Other Ambulatory Visit (HOSPITAL_COMMUNITY): Payer: Self-pay | Admitting: Psychiatry

## 2020-07-29 DIAGNOSIS — F322 Major depressive disorder, single episode, severe without psychotic features: Secondary | ICD-10-CM

## 2020-08-02 DIAGNOSIS — R059 Cough, unspecified: Secondary | ICD-10-CM | POA: Diagnosis not present

## 2020-08-18 ENCOUNTER — Telehealth (INDEPENDENT_AMBULATORY_CARE_PROVIDER_SITE_OTHER): Payer: Medicare Other | Admitting: Psychiatry

## 2020-08-18 ENCOUNTER — Encounter (HOSPITAL_COMMUNITY): Payer: Self-pay | Admitting: Psychiatry

## 2020-08-18 ENCOUNTER — Other Ambulatory Visit: Payer: Self-pay

## 2020-08-18 VITALS — Wt 190.0 lb

## 2020-08-18 DIAGNOSIS — F419 Anxiety disorder, unspecified: Secondary | ICD-10-CM | POA: Diagnosis not present

## 2020-08-18 DIAGNOSIS — F322 Major depressive disorder, single episode, severe without psychotic features: Secondary | ICD-10-CM | POA: Diagnosis not present

## 2020-08-18 MED ORDER — TRAZODONE HCL 150 MG PO TABS: 150.0000 mg | ORAL_TABLET | Freq: Every day | ORAL | 0 refills | Status: DC

## 2020-08-18 MED ORDER — VORTIOXETINE HBR 20 MG PO TABS: 20.0000 mg | ORAL_TABLET | Freq: Every day | ORAL | 0 refills | Status: DC

## 2020-08-18 MED ORDER — LAMOTRIGINE 25 MG PO TABS: 50.0000 mg | ORAL_TABLET | Freq: Every day | ORAL | 1 refills | Status: DC

## 2020-08-18 NOTE — Progress Notes (Signed)
Virtual Visit via Telephone Note  I connected with Hayley [name] on 08/18/20 at  3:40 PM EST by telephone and verified that I am speaking with the correct person using two identifiers.  Location: Patient: Home Provider: Home Office   I discussed the limitations, risks, security and privacy concerns of performing an evaluation and management service by telephone and the availability of in person appointments. I also discussed with the patient that there may be a patient responsible charge related to this service. The patient expressed understanding and agreed to proceed.   History of Present Illness: Patient is evaluated by phone session.  On the last visit we recommend to try Lamictal 50 mg to help her depression.  She had a history of rash but she thought it was due to cats or dogs.  She is taking 50 mg and she does not report any rash but reported having itching and dry skin and sometimes tremors.  But overall she feels some improvement in her depression.  She has no longer suicidal thoughts and she is sleeping better.  She does still require a lot of motivation to do things.  She reported her daughter who had diagnosed with cancer is better now and recent test shows better results.  Her son lives in [place] and she did talk to him on Thanksgiving.  She still report a lot of financial issues and she is trying to negotiate with her [employer] to refinance the house and work on behind payments.  She like to give more time to the Lamictal because she noticed marginal improvement.  She is also compliant with trazodone and Trintellix.  Her energy level is fair.  Her appetite is okay.  She denies any changes in her weight.    Past Psychiatric History:Reviewed. H/Odepression and anxiety.H/Osuicidal attempt on Percocet in March 2019.TookEffexorin her 50sbut stopped after working for a while. Tried Zoloft, Paxil and Wellbutrin fromPCP but did not work. Did IOP,PHPand givenProzac  butnoimprovement.Tried lexapro caused GI s/e.  Psychiatric Specialty Exam: Physical Exam  Review of Systems  Weight 190 lb (86.2 kg), last menstrual period 09/18/1979.There is no height or weight on file to calculate BMI.  General Appearance: NA  Eye Contact:  NA  Speech:  Slow  Volume:  Decreased  Mood:  Anxious and Dysphoric  Affect:  NA  Thought Process:  Descriptions of Associations: Intact  Orientation:  Full (Time, Place, and Person)  Thought Content:  Rumination  Suicidal Thoughts:  No  Homicidal Thoughts:  No  Memory:  Immediate;   Good Recent;   Fair Remote;   Fair  Judgement:  Intact  Insight:  Present  Psychomotor Activity:  NA  Concentration:  Concentration: Fair and Attention Span: Fair  Recall:  Good  Fund of Knowledge:  Good  Language:  Fair  Akathisia:  No  Handed:  Right  AIMS (if indicated):     Assets:  Desire for Improvement Housing Resilience  ADL's:  Intact  Cognition:  WNL  Sleep:   7 hrs      Assessment and Plan: Major depressive disorder, recurrent.  Anxiety.  Patient like to keep the Lamictal 50 mg because she noticed mild improvement in her depression and anxiety and she is no longer having suicidal thoughts.  She has no rash but she has itching and dry skin.  I recommend it could be due to cold and dry rather and recommend to use moisturizer.  However reminded if she had rash then she need to call  us immediately.  She does not want to try a new medication for now.  I agree with the plan.  Her sleep is also improved.  Continue trazodone 150 mg at bedtime, Trintellix 20 mg daily and Lamictal 50 mg daily.  Patient at this time cannot afford therapy.  Recommended to call us back if she is any question or any concern.  Follow-up in 2 months.   Follow Up Instructions:    I discussed the assessment and treatment plan with the patient. The patient was provided an opportunity to ask questions and all were answered. The patient agreed with the  plan and demonstrated an understanding of the instructions.   The patient was advised to call back or seek an in-person evaluation if the symptoms worsen or if the condition fails to improve as anticipated.  I provided 19 minutes of non-face-to-face time during this encounter.   [name], MD

## 2020-09-01 DIAGNOSIS — Z1211 Encounter for screening for malignant neoplasm of colon: Secondary | ICD-10-CM | POA: Diagnosis not present

## 2020-09-05 DIAGNOSIS — R053 Chronic cough: Secondary | ICD-10-CM | POA: Diagnosis not present

## 2020-09-07 DIAGNOSIS — R399 Unspecified symptoms and signs involving the genitourinary system: Secondary | ICD-10-CM | POA: Diagnosis not present

## 2020-09-13 ENCOUNTER — Other Ambulatory Visit (HOSPITAL_COMMUNITY): Payer: Self-pay | Admitting: Psychiatry

## 2020-09-13 DIAGNOSIS — F322 Major depressive disorder, single episode, severe without psychotic features: Secondary | ICD-10-CM

## 2020-10-13 ENCOUNTER — Other Ambulatory Visit: Payer: Self-pay

## 2020-10-13 ENCOUNTER — Encounter: Payer: Self-pay | Admitting: Pulmonary Disease

## 2020-10-13 ENCOUNTER — Ambulatory Visit (INDEPENDENT_AMBULATORY_CARE_PROVIDER_SITE_OTHER): Payer: Medicare Other | Admitting: Pulmonary Disease

## 2020-10-13 ENCOUNTER — Ambulatory Visit (INDEPENDENT_AMBULATORY_CARE_PROVIDER_SITE_OTHER): Payer: Medicare Other

## 2020-10-13 VITALS — BP 110/68 | HR 65 | Temp 97.2°F | Ht 66.0 in | Wt 204.2 lb

## 2020-10-13 DIAGNOSIS — J383 Other diseases of vocal cords: Secondary | ICD-10-CM | POA: Diagnosis not present

## 2020-10-13 DIAGNOSIS — R053 Chronic cough: Secondary | ICD-10-CM

## 2020-10-13 DIAGNOSIS — R059 Cough, unspecified: Secondary | ICD-10-CM | POA: Diagnosis not present

## 2020-10-13 LAB — CBC WITH DIFFERENTIAL/PLATELET
Basophils Absolute: 0 10*3/uL (ref 0.0–0.1)
Basophils Relative: 0.5 % (ref 0.0–3.0)
Eosinophils Absolute: 0.1 10*3/uL (ref 0.0–0.7)
Eosinophils Relative: 2.2 % (ref 0.0–5.0)
HCT: 43.1 % (ref 36.0–46.0)
Hemoglobin: 14.3 g/dL (ref 12.0–15.0)
Lymphocytes Relative: 23.6 % (ref 12.0–46.0)
Lymphs Abs: 1.1 10*3/uL (ref 0.7–4.0)
MCHC: 33.1 g/dL (ref 30.0–36.0)
MCV: 86.5 fl (ref 78.0–100.0)
Monocytes Absolute: 0.5 10*3/uL (ref 0.1–1.0)
Monocytes Relative: 10.5 % (ref 3.0–12.0)
Neutro Abs: 3 10*3/uL (ref 1.4–7.7)
Neutrophils Relative %: 63.2 % (ref 43.0–77.0)
Platelets: 265 10*3/uL (ref 150.0–400.0)
RBC: 4.98 Mil/uL (ref 3.87–5.11)
RDW: 13.2 % (ref 11.5–15.5)
WBC: 4.7 10*3/uL (ref 4.0–10.5)

## 2020-10-13 NOTE — Progress Notes (Signed)
Synopsis: Referred in January 2022 for cough  Subjective:   PATIENT ID: Hayley [name] GENDER: female DOB: 1946/04/12, MRN: [id number]   HPI  Chief Complaint  Patient presents with  . Consult    Productive cough with yellow and green phlegm for past 4 months, shortness of breath with activity and rest, pain on right side of chest when coughs   Hayley [name] is a 75 year old woman, former smoker with spasmodic dysphonia, GERD and sleep apnea who is referred to pulmonary clinic for evaluation of cough.   She was previously seen by Dr. Lake Bells in 2018 and was treated for concern of bronchiectasis given her cough symptoms at that time.   She reports over the past 4 months her cough has become more constant and she has developed right sided chest discomfort from coughing. She does report some wheezing. She uses combivent as needed with some relief. She feels like her throat is irritated and continues to trigger her to cough but she also feels she has chest congestion at times. She complains of sinus congestion and runny nose. She also complains of difficulty swallowing and MBS in 2017 indicated a mild aspiration risk.    Past Medical History:  Diagnosis Date  . Anemia   . Anxiety   . Bronchitis    hx of   . Clotting disorder ([place]) 02-17-14   S/p appendectomy-developed Pulmonary emboli-tx. warfarin-d/c 1 yr ago.  . Colon polyps   . Depression   . Diabetes mellitus without complication ([place])    "borderline"- oral med  . Disorder of vocal cords    spasmotic dysphonia ,02-17-14 has" whispery voice-low tone"  . Hyperthyroidism   . Kidney stones   . Migraines   . Nausea Nov 15, 2014   cycle of migraine headaches and nausea  . Neuromuscular disorder ([place])    hands and throat"spasmodic dysphonia", tremors - thimb and forefinger   . Obesity, Class III, BMI 40-49.9 (morbid obesity) ([place]) 04/27/2010   02-17-14 reports some weight loss- intentional  . Osteoarthritis    in back(chronic pain)  .  Pelvic pain    02-17-14 "states thinks its back related on left groin"  . Pulmonary emboli [facility])    s/p Appendectomy '13 [facility]  . Reflux   . Right knee pain   . Sleep apnea    cpap - settings at 4   . Toxic goiter      Family History  Problem Relation Age of Onset  . Heart disease Mother   . Pneumonia Father   . Cirrhosis Father   . Hypertension Father   . Alcoholism Father   . Heart attack Paternal Grandfather   . Heart attack Maternal Grandfather   . Heart attack Paternal Grandmother   . Heart attack Maternal Grandmother   . Breast cancer Daughter      Social History   Socioeconomic History  . Marital status: Married    Spouse name: Not on file  . Number of children: 5  . Years of education: 24  . Highest education level: Not on file  Occupational History  . Occupation: Retired  Tobacco Use  . Smoking status: Former Smoker    Packs/day: 0.25    Years: 5.00    Pack years: 1.25    Types: Cigarettes    Quit date: 01/03/1982    Years since quitting: 38.8  . Smokeless tobacco: Never Used  Vaping Use  . Vaping Use: Never used  Substance and Sexual Activity  .  Alcohol use: No  . Drug use: No  . Sexual activity: Not Currently  Other Topics Concern  . Not on file  Social History Narrative   Lives at home alone at this time.   Right-handed.   2 cups caffeine per day.   Social Determinants of Health   Financial Resource Strain: Not on file  Food Insecurity: Not on file  Transportation Needs: Not on file  Physical Activity: Not on file  Stress: Not on file  Social Connections: Not on file  Intimate Partner Violence: Not on file     Allergies  Allergen Reactions  . Bee Venom Anaphylaxis  . Morphine And Related   . Tape Rash    Rash - only use paper tape  . Etodolac Diarrhea and Nausea And Vomiting  . Morphine Other (See Comments)    Hallucinations  . Other Itching and Swelling    Bee venom  . Poison Ivy Extract  [Poison Ivy Extract] Itching and  Swelling  . Propoxyphene Nausea And Vomiting  . Propoxyphene N-Acetaminophen Nausea And Vomiting  . Penicillins Rash    Has patient had a PCN reaction causing immediate rash, facial/tongue/throat swelling, SOB or lightheadedness with hypotension: Yes Has patient had a PCN reaction causing severe rash involving mucus membranes or skin necrosis: No Has patient had a PCN reaction that required hospitalization No Has patient had a PCN reaction occurring within the last 10 years: No If all of the above answers are "NO", then may proceed with Cephalosporin use.      Outpatient Medications Prior to Visit  Medication Sig Dispense Refill  . apixaban (ELIQUIS) 2.5 MG TABS tablet Take 1 tablet (2.5 mg total) by mouth 2 (two) times daily. 180 tablet 4  . cyanocobalamin (,VITAMIN B-12,) 1000 MCG/ML injection Inject 1 mL into the skin every 30 (thirty) days.  11  . diclofenac (FLECTOR) 1.3 % PTCH Place 1 patch onto the skin daily. 20 patch 1  . hydrOXYzine (ATARAX/VISTARIL) 25 MG tablet Take 1 tablet (25 mg total) by mouth 3 (three) times daily as needed for anxiety, itching or nausea. 30 tablet 0  . hyoscyamine (ANASPAZ) 0.125 MG TBDP disintergrating tablet Place 0.125 mg under the tongue every 4 (four) hours as needed for bladder spasms or cramping.     . Ipratropium-Albuterol (COMBIVENT IN) Inhale into the lungs.    [name] levothyroxine (SYNTHROID, LEVOTHROID) 25 MCG tablet Take 25 mcg by mouth every morning.     . metoCLOPramide (REGLAN) 5 MG tablet Take 5 mg by mouth 3 (three) times daily as needed for nausea.    . propranolol (INDERAL) 60 MG tablet Take 60 mg by mouth 2 (two) times daily.    . simethicone (MYLICON) 80 MG chewable tablet Chew 1 tablet (80 mg total) by mouth 4 (four) times daily as needed for flatulence (belching). 30 tablet 0  . valACYclovir (VALTREX) 1000 MG tablet Take 2,000 mg by mouth 2 (two) times daily as needed (Fever blisters). Reported on 01/10/2016  2  . traZODone (DESYREL) 150  MG tablet Take 1 tablet (150 mg total) by mouth at bedtime. 90 tablet 0  . vortioxetine HBr (TRINTELLIX) 20 MG TABS tablet Take 1 tablet (20 mg total) by mouth daily. 90 tablet 0  . Erenumab-aooe (AIMOVIG) 70 MG/ML SOAJ Inject 70 mg into the skin every 30 (thirty) days. (Patient not taking: Reported on 10/13/2020) 1 pen 11  . lamoTRIgine (LAMICTAL) 25 MG tablet Take 2 tablets (50 mg total) by mouth daily. (Patient  not taking: Reported on 10/13/2020) 60 tablet 1  . mometasone-formoterol (DULERA) 100-5 MCG/ACT AERO Inhale 2 puffs into the lungs 2 (two) times daily as needed for wheezing or shortness of breath.  (Patient not taking: Reported on 10/13/2020)    . MYRBETRIQ 50 MG TB24 tablet Take 50 mg by mouth at bedtime.  (Patient not taking: Reported on 10/13/2020)    . OnabotulinumtoxinA (BOTOX IM) Inject 200 Units into the muscle every 3 (three) months. (Patient not taking: Reported on 10/13/2020)    . sitaGLIPtin (JANUVIA) 25 MG tablet Take 25 mg by mouth daily. (Patient not taking: Reported on 10/13/2020)     No facility-administered medications prior to visit.    Review of Systems  Constitutional: Negative for chills, fever, malaise/fatigue and weight loss.  HENT: Positive for congestion. Negative for sinus pain and sore throat.   Eyes: Negative.   Respiratory: Positive for cough, sputum production and shortness of breath. Negative for hemoptysis and wheezing.   Cardiovascular: Negative for chest pain, palpitations, orthopnea, claudication and leg swelling.  Gastrointestinal: Positive for abdominal pain. Negative for heartburn, nausea and vomiting.  Genitourinary: Negative.   Musculoskeletal: Positive for joint pain. Negative for myalgias.  Skin: Negative for rash.  Neurological: Positive for headaches. Negative for weakness.  Endo/Heme/Allergies: Negative.   Psychiatric/Behavioral: Positive for depression. The patient is nervous/anxious.       Objective:   Vitals:   10/13/20 0940  BP:  110/68  Pulse: 65  Temp: (!) 97.2 F (36.2 C)  TempSrc: Other (Comment)  SpO2: 91%  Weight: 204 lb 3.2 oz (92.6 kg)  Height: 5\' 6"  (1.676 m)     Physical Exam Constitutional:      General: She is not in acute distress.    Appearance: She is obese. She is not ill-appearing.  HENT:     Head: Normocephalic and atraumatic.  Eyes:     General: No scleral icterus.    Conjunctiva/sclera: Conjunctivae normal.     Pupils: Pupils are equal, round, and reactive to light.  Cardiovascular:     Rate and Rhythm: Normal rate and regular rhythm.     Pulses: Normal pulses.     Heart sounds: Normal heart sounds. No murmur heard.   Pulmonary:     Effort: Pulmonary effort is normal.     Breath sounds: Normal breath sounds. No wheezing, rhonchi or rales.  Abdominal:     General: Bowel sounds are normal.     Palpations: Abdomen is soft.  Musculoskeletal:     Right lower leg: No edema.     Left lower leg: No edema.  Lymphadenopathy:     Cervical: No cervical adenopathy.  Skin:    General: Skin is warm and dry.  Neurological:     General: No focal deficit present.     Mental Status: She is alert.  Psychiatric:        Mood and Affect: Mood normal.        Behavior: Behavior normal.        Thought Content: Thought content normal.        Judgment: Judgment normal.    CBC    Component Value Date/Time   WBC 4.7 10/13/2020 1058   RBC 4.98 10/13/2020 1058   HGB 14.3 10/13/2020 1058   HGB 14.0 12/25/2017 1217   HGB 14.1 12/12/2016 1105   HCT 43.1 10/13/2020 1058   HCT 43.5 12/12/2016 1105   PLT 265.0 10/13/2020 1058   PLT 227 12/25/2017 1217   PLT  239 12/12/2016 1105   MCV 86.5 10/13/2020 1058   MCV 86.7 12/12/2016 1105   MCH 27.4 04/02/2019 0500   MCHC 33.1 10/13/2020 1058   RDW 13.2 10/13/2020 1058   RDW 14.4 12/12/2016 1105   LYMPHSABS 1.1 10/13/2020 1058   LYMPHSABS 1.4 12/12/2016 1105   MONOABS 0.5 10/13/2020 1058   MONOABS 0.6 12/12/2016 1105   EOSABS 0.1 10/13/2020 1058    EOSABS 0.3 12/12/2016 1105   BASOSABS 0.0 10/13/2020 1058   BASOSABS 0.0 12/12/2016 1105   BMP Latest Ref Rng & Units 04/04/2019 04/01/2019 03/31/2019  Glucose 70 - 99 mg/dL - 180(H) 92  BUN 8 - 23 mg/dL - 7(L) 6(L)  Creatinine 0.44 - 1.00 mg/dL 0.71 0.84 0.75  Sodium 135 - 145 mmol/L - 139 141  Potassium 3.5 - 5.1 mmol/L - 3.6 4.2  Chloride 98 - 111 mmol/L - 102 104  CO2 22 - 32 mmol/L - 28 27  Calcium 8.9 - 10.3 mg/dL - 8.7(L) 8.9   Chest imaging: CXR 10/03/20 Cardiac shadow is within normal limits. Spinal stimulator is noted. Bilateral breast implants are seen. No focal infiltrate or effusion is noted. Degenerative changes of the thoracic spine seen.  CTA Chest 04/01/2019 1. No evidence of large central or segmental pulmonary embolus. Respiratory motion may limit detection of smaller subsegmental pulmonary emboli. 2. Bandlike atelectatic changes are present at both bases. Geographic regions of mosaic attenuation are likely related to imaging during exhalation. No suspicious pulmonary nodules or masses. 3. Coronary atherosclerosis. 4.  Aortic Atherosclerosis  HRCT Chest 2018: 1. Scarring in the lingula and both lower lobes. No evidence of interstitial lung disease. 2. Aortic atherosclerosis (ICD10-170.0). Moderate coronary artery calcification. 3. Hepatic steatosis. Probable intense fat deposition within the caudate. Difficult to exclude a true lesion. If further evaluation is desired, MR abdomen without and with contrast is recommended.  PFT: No flowsheet data found.  Echo: 04/01/2019 1. The left ventricle has normal systolic function, with an ejection  fraction of 55-60%. The cavity size was normal. Left ventricular diastolic  Doppler parameters are consistent with impaired relaxation.  2. The right ventricle has normal systolic function. The cavity was  normal. There is no increase in right ventricular wall thickness.  3. Left atrial size was mildly dilated.   4. Right atrial size was mildly dilated.  5. No stenosis of the aortic valve.  6. The aortic root and ascending aorta are normal in size and structure.  7. The interatrial septum was not assessed.   EGD 2011: Small hiatal hernia  MBS 2017 Speech Therapy Mild Aspiration Risk  Assessment & Plan:   Chronic cough - Plan: DG Chest 2 View, CBC with Differential, IgE, IgE, CBC with Differential, Flutter valve  Spasmodic dysphonia - Plan: Flutter valve  Discussion: Hayley [name] is a 75 year old woman, former smoker with spasmodic dysphonia, GERD and sleep apnea who is referred to pulmonary clinic for evaluation of cough.   It is difficult to determine the etiology of her cough at this time. We will start her on nebulizer treatments 2-3 times daily with flutter valve treatment for bronchial hygiene and mucous clearance.   She does have hiatal hernia and GERD which could be contributing to her cough. Her chest radiograph and lab work today are reassuring. No elevation in eosinophils or IgE. She could certainly be having aspiration events that are leading to her cough based on her MBS from 2017. We can consider repeating this if clinical history becomes more  concerning. Follow up in 2 months.  Freda Jackson, MD [facility] Office: [phone]   See Amion for Pager Details      Current Outpatient Medications:  .  apixaban (ELIQUIS) 2.5 MG TABS tablet, Take 1 tablet (2.5 mg total) by mouth 2 (two) times daily., Disp: 180 tablet, Rfl: 4 .  cyanocobalamin (,VITAMIN B-12,) 1000 MCG/ML injection, Inject 1 mL into the skin every 30 (thirty) days., Disp: , Rfl: 11 .  diclofenac (FLECTOR) 1.3 % PTCH, Place 1 patch onto the skin daily., Disp: 20 patch, Rfl: 1 .  hydrOXYzine (ATARAX/VISTARIL) 25 MG tablet, Take 1 tablet (25 mg total) by mouth 3 (three) times daily as needed for anxiety, itching or nausea., Disp: 30 tablet, Rfl: 0 .  hyoscyamine (ANASPAZ) 0.125 MG TBDP  disintergrating tablet, Place 0.125 mg under the tongue every 4 (four) hours as needed for bladder spasms or cramping. , Disp: , Rfl:  .  Ipratropium-Albuterol (COMBIVENT IN), Inhale into the lungs., Disp: , Rfl:  .  levothyroxine (SYNTHROID, LEVOTHROID) 25 MCG tablet, Take 25 mcg by mouth every morning. , Disp: , Rfl:  .  metoCLOPramide (REGLAN) 5 MG tablet, Take 5 mg by mouth 3 (three) times daily as needed for nausea., Disp: , Rfl:  .  propranolol (INDERAL) 60 MG tablet, Take 60 mg by mouth 2 (two) times daily., Disp: , Rfl:  .  simethicone (MYLICON) 80 MG chewable tablet, Chew 1 tablet (80 mg total) by mouth 4 (four) times daily as needed for flatulence (belching)., Disp: 30 tablet, Rfl: 0 .  valACYclovir (VALTREX) 1000 MG tablet, Take 2,000 mg by mouth 2 (two) times daily as needed (Fever blisters). Reported on 01/10/2016, Disp: , Rfl: 2 .  albuterol (PROVENTIL) (2.5 MG/3ML) 0.083% nebulizer solution, Take by nebulization., Disp: , Rfl:  .  Erenumab-aooe (AIMOVIG) 70 MG/ML SOAJ, Inject 70 mg into the skin every 30 (thirty) days. (Patient not taking: Reported on 10/13/2020), Disp: 1 pen, Rfl: 11 .  traZODone (DESYREL) 150 MG tablet, Take 1 tablet (150 mg total) by mouth at bedtime., Disp: 90 tablet, Rfl: 0 .  vortioxetine HBr (TRINTELLIX) 10 MG TABS tablet, Take 1 tablet (10 mg total) by mouth daily., Disp: 30 tablet, Rfl: 1

## 2020-10-13 NOTE — Patient Instructions (Addendum)
Start nebulizer treatments 3 times per day with flutter valve  Start taking mucinex D [phone] 1 tab twice daily  We will check labs today to make sure mold is not contributing to your cough  We will check a chest radiograph today

## 2020-10-14 ENCOUNTER — Telehealth: Payer: Self-pay | Admitting: Pulmonary Disease

## 2020-10-14 LAB — IGE: IgE (Immunoglobulin E), Serum: 21 kU/L (ref <=114)

## 2020-10-14 NOTE — Telephone Encounter (Signed)
Pt requesting an order for nebulizer supplies- prefers to use a nebulizer mask instead of mouthpiece.  Pt states she does not know what DME company she is established with- wants to look into this and call us back so we can send the order to the appropriate company. Will await call back.

## 2020-10-19 ENCOUNTER — Telehealth (HOSPITAL_COMMUNITY): Payer: Medicare Other | Admitting: Psychiatry

## 2020-10-19 ENCOUNTER — Other Ambulatory Visit: Payer: Self-pay

## 2020-10-19 NOTE — Telephone Encounter (Signed)
Called and spoke with Patient.  Patient stated she is still looking for DME company for neb supplies.  Patient stated current neb machine was 76 years old. Offered a new order for neb machine and Patient refused, because current neb machine works fine. Patient stated she is able to use nebs as prescribed and recommended by Dr. Erin Fulling. Patient stated she would notify office with DME company.  Patient did request results from cxr and labs from 10/13/20.  Message routed to Dr. Erin Fulling to advise on labs and cxr

## 2020-10-20 NOTE — Telephone Encounter (Signed)
Patient's lab results are normal. Her chest radiograph is normal as well.   She can be prescribed duoneb solution to be used as needed for her shortness of breath.   Thanks, Wille Glaser

## 2020-10-20 NOTE — Telephone Encounter (Signed)
Called spoke with patient She said she has albuterol neb packets from her PCP she is using currently so doesn't need anything more.  Let her know her results.  Nothing further needed at this time.

## 2020-10-21 DIAGNOSIS — M5416 Radiculopathy, lumbar region: Secondary | ICD-10-CM | POA: Diagnosis not present

## 2020-10-21 DIAGNOSIS — M5137 Other intervertebral disc degeneration, lumbosacral region: Secondary | ICD-10-CM | POA: Diagnosis not present

## 2020-10-24 ENCOUNTER — Encounter (HOSPITAL_COMMUNITY): Payer: Self-pay | Admitting: Psychiatry

## 2020-10-24 ENCOUNTER — Telehealth: Payer: Self-pay | Admitting: Neurology

## 2020-10-24 ENCOUNTER — Other Ambulatory Visit: Payer: Self-pay

## 2020-10-24 ENCOUNTER — Telehealth (INDEPENDENT_AMBULATORY_CARE_PROVIDER_SITE_OTHER): Payer: Medicare Other | Admitting: Psychiatry

## 2020-10-24 DIAGNOSIS — F322 Major depressive disorder, single episode, severe without psychotic features: Secondary | ICD-10-CM

## 2020-10-24 DIAGNOSIS — F419 Anxiety disorder, unspecified: Secondary | ICD-10-CM

## 2020-10-24 MED ORDER — TRAZODONE HCL 150 MG PO TABS: 150.0000 mg | ORAL_TABLET | Freq: Every day | ORAL | 0 refills | Status: DC

## 2020-10-24 MED ORDER — VORTIOXETINE HBR 10 MG PO TABS: 10.0000 mg | ORAL_TABLET | Freq: Every day | ORAL | 1 refills | Status: DC

## 2020-10-24 NOTE — Progress Notes (Signed)
Virtual Visit via Telephone Note  I connected with Hayley [name] on 10/24/20 at  3:20 PM EST by telephone and verified that I am speaking with the correct person using two identifiers.  Location: Patient: Home Provider: Home Office   I discussed the limitations, risks, security and privacy concerns of performing an evaluation and management service by telephone and the availability of in person appointments. I also discussed with the patient that there may be a patient responsible charge related to this service. The patient expressed understanding and agreed to proceed.   History of Present Illness: Patient is evaluated by phone session.  She had stopped the Lamictal 4 weeks ago because of tremors.  She has difficulty holding a glass of water and fork.  Since she stopped the Lamictal she see very marginal improvement as she still have tremors and shakes.  She did not notice worsening of depression and anxiety after stopping the Lamictal.  She admitted her mood is sad because her Christmas was very quiet.  She was by herself but she is glad it is over.  She has ruminative thoughts but denies any suicidal thoughts or homicidal thoughts.  Her son lives in [place] and she did talk to him on Christmas.  Patient told he is busy and I do not want him to be bothered by calling him.  We talked about chronic tremors and follow-up with neurologist.  She saw Dr. [name] in the past but has no follow-up in recent months.  She is taking propanolol and sometimes hydroxyzine to help her nausea and itching.  Her appetite is fair.  Her weight fluctuates around 200.  She denies any anger, crying spells.  She denies any paranoia or any hallucination.  She is sleeping up to 5 to 6 hours with the help of trazodone.  Now she like to come off from Trintellix to see if that helps her tremors.  Past Psychiatric History:Reviewed. H/Odepression and anxiety.H/Osuicidal attempt on Percocet in March 2019.TookEffexorin her  50sbut stopped after working for a while. Tried Zoloft, Paxil and Wellbutrin fromPCP but did not work. Did IOP,PHPand givenProzac butnoimprovement.Tried lexapro caused GI s/e.   Psychiatric Specialty Exam: Physical Exam  Review of Systems  Weight 204 lb (92.5 kg), last menstrual period 09/18/1979.There is no height or weight on file to calculate BMI.  General Appearance: NA  Eye Contact:  NA  Speech:  Slow  Volume:  Decreased  Mood:  Anxious  Affect:  NA  Thought Process:  Goal Directed  Orientation:  Full (Time, Place, and Person)  Thought Content:  Rumination  Suicidal Thoughts:  No  Homicidal Thoughts:  No  Memory:  Immediate;   Good Recent;   Fair Remote;   Fair  Judgement:  Intact  Insight:  Present  Psychomotor Activity:  Tremor  Concentration:  Concentration: Fair and Attention Span: Fair  Recall:  Good  Fund of Knowledge:  Good  Language:  Good  Akathisia:  No  Handed:  Right  AIMS (if indicated):     Assets:  Communication Skills Desire for Improvement Housing Resilience  ADL's:  Intact  Cognition:  WNL  Sleep:   5-6 hrs      Assessment and Plan: Major depressive disorder, recurrent.  Anxiety.  Tremors.  Patient is no longer taking Lamictal because she noticed worsening of tremors and have difficulty holding the water glass and fork.  Though she has not seen a huge improvement but does not want to go back on Lamictal and even  like to cut down the Trintellix to see if her tremors go away.  I encouraged to contact her neurologist Dr. [name] for follow-up.  She requested to send my progress note to Dr. [name] and I will do that.  I will also cut down the Trintellix 10 mg however I explained if she feels worsening of depression and anxiety then she need to go back on 20 mg.  I encourage to continue trazodone since it is helping sleep 5 to 6 hours.  I also recommend she can take hydroxyzine which was given for nausea and itching to see if that helps the tremors.   Discussed medication side effects and benefits.  We have offered therapy but at this time patient cannot afford.  Recommended to call us back if is any question or any concern.  Follow-up in 2 months.   Follow Up Instructions:    I discussed the assessment and treatment plan with the patient. The patient was provided an opportunity to ask questions and all were answered. The patient agreed with the plan and demonstrated an understanding of the instructions.   The patient was advised to call back or seek an in-person evaluation if the symptoms worsen or if the condition fails to improve as anticipated.  I provided 17 minutes of non-face-to-face time during this encounter.   [name], MD

## 2020-10-24 NOTE — Telephone Encounter (Signed)
Put her on schedule for next available for tremor

## 2020-10-24 NOTE — Telephone Encounter (Signed)
I spoke to the patient. I offered an appt in the morning but she does not feel comfortable driving due to weather. I offered several morning appts next week but she prefers afternoons. She accepted an appt on 11/14/20 at 1pm with Dr. [name]. She is aware to arrive early for check-in.

## 2020-10-27 ENCOUNTER — Encounter: Payer: Self-pay | Admitting: Pulmonary Disease

## 2020-11-11 DIAGNOSIS — M50123 Cervical disc disorder at C6-C7 level with radiculopathy: Secondary | ICD-10-CM | POA: Diagnosis not present

## 2020-11-11 DIAGNOSIS — M5412 Radiculopathy, cervical region: Secondary | ICD-10-CM | POA: Diagnosis not present

## 2020-11-14 ENCOUNTER — Encounter: Payer: Self-pay | Admitting: Neurology

## 2020-11-14 ENCOUNTER — Ambulatory Visit (INDEPENDENT_AMBULATORY_CARE_PROVIDER_SITE_OTHER): Payer: Medicare Other | Admitting: Neurology

## 2020-11-14 VITALS — BP 120/66 | HR 58 | Ht 66.0 in | Wt 202.0 lb

## 2020-11-14 DIAGNOSIS — G250 Essential tremor: Secondary | ICD-10-CM

## 2020-11-14 DIAGNOSIS — G43709 Chronic migraine without aura, not intractable, without status migrainosus: Secondary | ICD-10-CM | POA: Diagnosis not present

## 2020-11-14 MED ORDER — PROPRANOLOL HCL ER 120 MG PO CP24: 120.0000 mg | ORAL_CAPSULE | Freq: Every day | ORAL | 11 refills | Status: DC

## 2020-11-14 NOTE — Progress Notes (Signed)
Chief Complaint  Patient presents with  . Follow-up    Reports worsening of her bilateral hand tremors. She is having a hard time with writing and eating. She has also stopped using her Aimovig in October 2021 due to not being able to self inject the medication. Her migraines are more frequent. She had three migraine days just last week.       PATIENT: Hayley [name] DOB: 1946/01/09  Chief Complaint  Patient presents with  . Follow-up    Reports worsening of her bilateral hand tremors. She is having a hard time with writing and eating. She has also stopped using her Aimovig in October 2021 due to not being able to self inject the medication. Her migraines are more frequent. She had three migraine days just last week.      HISTORICAL  BURLENE MONTECALVO is a 75 years old right-handed female, seen in refer by hand surgeon Dr. Daryll Brod, and her primary care PA Flora Lipps for evaluation of neck pain, radiating pain to bilateral shoulder, upper extremity  She has past medical history of spasmodic dysphonia, status post surgical correction, also had a history of hypertension, diabetes, DVT, PE following surgical procedure, most recent was right knee replacement in March 2016, is taking anticoagulation pradaxa, chronic low back pain, previous history of epidural injection for low back pain.  She fell from law mower in June 2016, landed on her back, she was able to got up, finished mowing her lawn, later that day, she noticed worsening low back pain, significant neck pain, radiating pain to her bilateral shoulder, upper extremity, hands, she also noticed bilateral hands paresthesia, burning sensation, subjective weakness, she had worsening urinary urgency, wear pads occasionally, mild unbalanced gait, mild constipation  She was evaluated by [facility] orthopedic surgeon Dr. Rolena Infante for her worsening low back pain, reported MRI of lumbar spine at [facility] in July 2016, was told she is not a  surgical candidate, she does has multilevel lumbar degenerative disc disease.  She is also concerned about her bilateral hands tremor, getting worse since 2015, most noticeable when she writes, holding utensils, there was no similar family history  UPDATE August 24th 2016: She continues to complain bilateral neck pain, radiating pain to bilateral shoulder, arm, We have reviewed MRI cervical spine (without) demonstrating: 1. At C3-4: disc bulging and uncovertebral joint hypertrophy and facet hypertrophy with severe left foraminal stenosis 2. At C4-5: disc bulging and uncovertebral joint hypertrophy with severe biforaminal stenosis 3. At C5-6: disc bulging and uncovertebral joint hypertrophy with moderate right and severe left foraminal stenosis 4. At C6-7: disc bulging and uncovertebral joint hypertrophy with moderate biforaminal stenosis 5. No intrinsic or compressive spinal cord lesions  Propanolol 40 mg twice a day has helped her tremor, she denies significant side effect.  UPDATE June 26th 2017: She continue complains of bilateral lower extremity posturing tremor, significant neck pain, radiating pain to bilateral upper extremity, increased unsteady gait, she has fell few times, worsening bilateral lower extremity paresthesia  UPDATE Sept 1 2017: She has worsening low back pain, it gets worse with any movement, difficulty changing bed linen, she is no longer cooking,   She recently had spinal stimulator trial by [facility] by Dr. Delfino Lovett Rauck,,was deemed to be a potential candidate, she did responding well to the spinal stimulator. She complains of midline constant low back pain, radiating down to right hip, right leg and left leg, getting worse with movement, she has bladder urgency, no  bowel incontinence.   She had physical therapy, last in July 2017,    She also complains of migraine, seems to get more frequent since March 2017, she is now having headaches 3-5/week,  lasting for 4 hours or longer, with light noise sensitivity nauseous,  She used to get Botox injection as migraine prevention from Dr. Samuel Germany, responded very well, last injection was in 2015, responding well. She is taking Imitrex, Zofran as needed  She is on antibiotics for chronic UTI,  Update June 20 2016: She came in for Botox injection as migraine prevention, also complains worsening bilateral hands tremor, hope to received EMG guided Botox injection for her right hand tremor.  UPDATE Sep 27 2015: She had sudden onset of left shoulder pain, neck pain, I personally reviewed x-ray of the cervical spine, evidence of multilevel degenerative disc disease most severe C5-6 C6-7, mild to moderate bilateral foraminal stenosis  She responded well to previous Botox injection in October 2017, reported 75% improvement, over past 3 months, used Imitrex injection once, 4 tablets of Imitrex 25 mg, works well,  She was not sure about the benefit of EMG guided botulism toxin injection for her right hand tremor,  UPDATE December 19 2016: She complains of frequent headaches since December 16 2016, bilateral occipital region, pressure, pounding, nauseous, light sensitivity, she still complains 7 out of 10 headaches, despite multiple dose of Imitrex 25 mg tablets, and Imitrex injection, she has used up all her supplies, she also noticed increased bilateral hands tremor, feeling electronic sensation underneath her skin, to her shoulder down to her arms and legs, it is out of her control, but she denied anxiety or depression,  She has self adjusted her medication recently, she stopped the Wellbutrin by herself for a while, started about 3 weeks ago, also has run out of propanolol,  Husband also reported that she spent most of time in her bed, not feeling well.  UPDATE January 02 2017: Her headache overall is under good control, return for Botox injection as migraine prevention, also extra injection for her right hand  tremor, writer's cramp  UPDATE Sept 19 2018: She responded well to previous injection for migraine prevention but did not help her essential tremor much,  UPDATE Jun 25 2018: Her husband has stroke in Dec 2018, she has gone through significant emotional and financial stress, suffered a significant depression, attempted suicidal overdose on Percocet in March 2019, she also presented with significant GI symptoms, nausea vomiting abdominal pain, was treated with IV fluid, diagnosed with pancreatitis, was restarted on her antidepression Wellbutrin, trazodone, she is now discharged home, still dealing with significant depression, complains of intermittent diarrhea, stomach pain, lack of energy, She continue have frequent headaches, previous Botox works well for her migraine headaches, wants to continue Botox injection.  I also reviewed her geneSight psychotropic pharmacogenomic test result, that is most related to antipsychotics, antidepression use  UPDATE Jul 17 2018: She continues to have significant headaches, came in for Botox injection,  UPDATE Nov 13 2018: She complains of significant depression today, her husband is in nursing home, suffered a heart attack in January 2020, planning on to call in hospice care,  She complains of worsening bilateral hands tremor, previously tried primidone, propanolol all significant improvement, clonazepam 0.5 mg seems to help her some, I checked it [place] controlled substance registry, there is no history of overuse,  Her migraine overall is under good control with Botox injection, she is having migraine 1-2 times each week,  She also complains of diffuse body achy pain  Update 22-May-2019: Her husband passed away in 2018-12-24, she has gone through a lot of stress, hospital admission on April 05, 2019 for acute respiratory failure with hypoxia, she was considered to have multifactorial restrictive disease due to chronic basilar scarring of the  lung/atelectasis, obstructive sleep apnea, obesity, hypoventilation syndrome, she was discharged home with nighttime oxygen, she was diagnosed with sleep apnea obstructive many years ago, but no longer using her CPAP machine  She continue have intermittent migraine headaches, no longer uses triptan, Fioricet helps some, previous Botox injection is helpful, especially to alleviated her neck tension  UPDATE Feb 8th 2021: She missed her scheduled Botox injection as migraine prevention, noticed increased migraine headache over past 2 months, every other day, she has to take Tylenol, or Fioricet as needed   UPDATE Nov 14 2020: Today her main concern is worsening tremor  Tremor, he has a long history of gradual onset fairly symmetric bilateral hands action tremor, was diagnosed with essential tremor, gradually getting worse, previously tried beta-blocker, few times EMG guided Botox injection without helping her symptoms,  " it drives me crazy", she described difficulty holding utensils, put on make-up, she lives by herself, still driving, tremor has put major limitation in activities.  She continue taking Fioricet as needed for migraine headaches, it happened 2-3 times each week, usually starting at the neck, stay behind eyes, with mild light noise sensitivity, nauseous, lasting hours,.  She is now taking Fioricect, daily 2-3/weeks, if she can trated it with ibuprofen/Tyelnol, as needed, she does no take those daily. She has not ake themin in 2 weeks.  She seems to get more migraine headaches since November 2022,  She does not want to continue Botox injection as migraine prevention, did help her, but she does not like the needle pain with Botox injection    REVIEW OF SYSTEMS: Full 14 system review of systems performed and notable only for above   ALLERGIES: Allergies  Allergen Reactions  . Bee Venom Anaphylaxis  . Morphine And Related   . Tape Rash    Rash - only use paper tape  . Etodolac  Diarrhea and Nausea And Vomiting  . Morphine Other (See Comments)    Hallucinations  . Other Itching and Swelling    Bee venom  . Poison Ivy Extract  [Poison Ivy Extract] Itching and Swelling  . Propoxyphene Nausea And Vomiting  . Propoxyphene N-Acetaminophen Nausea And Vomiting  . Penicillins Rash    Has patient had a PCN reaction causing immediate rash, facial/tongue/throat swelling, SOB or lightheadedness with hypotension: Yes Has patient had a PCN reaction causing severe rash involving mucus membranes or skin necrosis: No Has patient had a PCN reaction that required hospitalization No Has patient had a PCN reaction occurring within the last 10 years: No If all of the above answers are "NO", then may proceed with Cephalosporin use.     HOME MEDICATIONS: Current Outpatient Prescriptions  Medication Sig Dispense Refill  . albuterol (PROVENTIL HFA;VENTOLIN HFA) 108 (90 BASE) MCG/ACT inhaler Inhale 1 puff into the lungs every 6 (six) hours as needed for wheezing or shortness of breath.    [name] albuterol (PROVENTIL) (2.5 MG/3ML) 0.083% nebulizer solution Take 2.5 mg by nebulization every 6 (six) hours as needed for wheezing or shortness of breath.    . benzonatate (TESSALON) 100 MG capsule Take 100 mg by mouth 3 (three) times daily as needed for cough.    [name]  clonazePAM (KLONOPIN) 0.5 MG tablet Take 0.5 mg by mouth at bedtime.    . cyclobenzaprine (FLEXERIL) 10 MG tablet Take 10 mg by mouth at bedtime.     [name] EPINEPHrine (EPIPEN) 0.3 mg/0.3 mL DEVI Inject 0.3 mg into the muscle once as needed (Allergic reaction).     . fluorometholone (FML) 0.1 % ophthalmic suspension Place 1 drop into both eyes daily as needed (Eye allergies).   0  . fluticasone (FLONASE) 50 MCG/ACT nasal spray Place 1 spray into both nostrils daily as needed for allergies or rhinitis.    [name] gabapentin (NEURONTIN) 800 MG tablet Take 800-1,600 mg by mouth 2 (two) times daily. 800 in the morning and 1600 at pm    .  HYDROcodone-acetaminophen (NORCO/VICODIN) 5-325 MG per tablet Take one to two tablets by mouth every 4 hours as needed for pain. Do not exceed 4gm of Tylenol in 24 hours 360 tablet 0  . levothyroxine (SYNTHROID, LEVOTHROID) 25 MCG tablet Take 25 mcg by mouth every morning.     . methocarbamol (ROBAXIN) 500 MG tablet Take 1 tablet (500 mg total) by mouth every 6 (six) hours as needed for muscle spasms. 90 tablet 0  . mometasone-formoterol (DULERA) 100-5 MCG/ACT AERO Inhale 2 puffs into the lungs 2 (two) times daily.    [name] nystatin (MYCOSTATIN) 100000 UNIT/ML suspension Take 5 mLs by mouth daily as needed (Yeast).    . ondansetron (ZOFRAN) 4 MG tablet Take 4 mg by mouth every 8 (eight) hours as needed for nausea or vomiting.    . ondansetron (ZOFRAN-ODT) 8 MG disintegrating tablet Take 8 mg by mouth every 8 (eight) hours as needed for nausea or vomiting.    [name] oxyCODONE (OXY IR/ROXICODONE) 5 MG immediate release tablet Take 1-2 tablets (5-10 mg total) by mouth every 3 (three) hours as needed for moderate pain or severe pain. 90 tablet 0  . pantoprazole (PROTONIX) 40 MG tablet Take 40 mg by mouth daily.    . Phenazopyridine HCl (AZO TABS PO) Take 1 tablet by mouth daily as needed (UTI).    . primidone (MYSOLINE) 50 MG tablet Take 50 mg by mouth at bedtime.    . promethazine (PHENERGAN) 25 MG tablet Take 25 mg by mouth every 6 (six) hours as needed for nausea or vomiting.    . Rivaroxaban (XARELTO) 15 MG TABS tablet Take 1 tablet (15 mg total) by mouth 2 (two) times daily with a meal. 60 tablet 5  . rizatriptan (MAXALT) 10 MG tablet Take 10 mg by mouth as needed for migraine (migraine). May repeat in 2 hours if needed    . sertraline (ZOLOFT) 100 MG tablet Take 100 mg by mouth daily.    . sitaGLIPtin (JANUVIA) 100 MG tablet Take 100 mg by mouth every evening.     . SUMAtriptan (IMITREX) 6 MG/0.5ML SOLN injection Inject 6 mg into the skin every 2 (two) hours as needed for migraine or headache. May repeat in  2 hours if headache persists or recurs.    . valACYclovir (VALTREX) 1000 MG tablet Take 2,000 mg by mouth 2 (two) times daily as needed (Fever blisters).   2     PAST MEDICAL HISTORY: Past Medical History:  Diagnosis Date  . Anemia   . Anxiety   . Bronchitis    hx of   . Clotting disorder ([place]) 02-17-14   S/p appendectomy-developed Pulmonary emboli-tx. warfarin-d/c 1 yr ago.  . Colon polyps   . Depression   . Diabetes mellitus without  complication ([place])    "borderline"- oral med  . Disorder of vocal cords    spasmotic dysphonia ,02-17-14 has" whispery voice-low tone"  . Hyperthyroidism   . Kidney stones   . Migraines   . Nausea Nov 15, 2014   cycle of migraine headaches and nausea  . Neuromuscular disorder ([place])    hands and throat"spasmodic dysphonia", tremors - thimb and forefinger   . Obesity, Class III, BMI 40-49.9 (morbid obesity) ([place]) 04/27/2010   02-17-14 reports some weight loss- intentional  . Osteoarthritis    in back(chronic pain)  . Pelvic pain    02-17-14 "states thinks its back related on left groin"  . Pulmonary emboli [facility]    s/p Appendectomy '13 [facility]  . Reflux   . Right knee pain   . Sleep apnea    cpap - settings at 4   . Toxic goiter     PAST SURGICAL HISTORY: Past Surgical History:  Procedure Laterality Date  . ABDOMINAL HYSTERECTOMY    . APPENDECTOMY     3'13 [facility] s/p developed  Pulmonary Emboli  . AUGMENTATION MAMMAPLASTY  09/17/1968  . BOTOX INJECTION     for migranes  . CATARACT EXTRACTION, BILATERAL Bilateral 03/2013   . CHOLECYSTECTOMY    . HAND SURGERY  2004   both hands  . KIDNEY STONE SURGERY    . KNEE ARTHROSCOPY  1998   right  . LAPAROSCOPIC APPENDECTOMY  11/27/2011   Procedure: APPENDECTOMY LAPAROSCOPIC;  Surgeon: Adin Hector, MD;  Location: [facility];  Service: General;  Laterality: N/A;  . SPINAL CORD STIMULATOR IMPLANT  06/04/2016   [facility] Alfonse Ras at [facility]  . TOTAL KNEE ARTHROPLASTY Right 12/06/2014   Procedure:  RIGHT TOTAL KNEE ARTHROPLASTY;  Surgeon: Gaynelle Arabian, MD;  Location: [facility];  Service: Orthopedics;  Laterality: Right;  . TOTAL KNEE REVISION Left 02/24/2014   Procedure: LEFT TOTAL KNEE REVISION;  Surgeon: Gearlean Alf, MD;  Location: [facility];  Service: Orthopedics;  Laterality: Left;  . vocal cord surgery  02-17-14   01-12-14 done in [place] [facility](s/p selective denervation/reinnervation recurrent laryngeal nerve surgery)  . zenkers diverticulum      FAMILY HISTORY: Family History  Problem Relation Age of Onset  . Heart disease Mother   . Pneumonia Father   . Cirrhosis Father   . Hypertension Father   . Alcoholism Father   . Heart attack Paternal Grandfather   . Heart attack Maternal Grandfather   . Heart attack Paternal Grandmother   . Heart attack Maternal Grandmother   . Breast cancer Daughter     SOCIAL HISTORY:  Social History   Socioeconomic History  . Marital status: Widowed    Spouse name: Not on file  . Number of children: 5  . Years of education: 74  . Highest education level: Not on file  Occupational History  . Occupation: Retired  Tobacco Use  . Smoking status: Former Smoker    Packs/day: 0.25    Years: 5.00    Pack years: 1.25    Types: Cigarettes    Quit date: 01/03/1982    Years since quitting: 38.8  . Smokeless tobacco: Never Used  Vaping Use  . Vaping Use: Never used  Substance and Sexual Activity  . Alcohol use: No  . Drug use: No  . Sexual activity: Not Currently  Other Topics Concern  . Not on file  Social History Narrative   Lives at home alone at this time.   Right-handed.  2 cups caffeine per day.   Social Determinants of Health   Financial Resource Strain: Not on file  Food Insecurity: Not on file  Transportation Needs: Not on file  Physical Activity: Not on file  Stress: Not on file  Social Connections: Not on file  Intimate Partner Violence: Not on file     PHYSICAL EXAM   Vitals:   11/14/20 1314  BP: 120/66   Pulse: (!) 58  Weight: 202 lb (91.6 kg)  Height: 5\' 6"  (1.676 m)   Not recorded     DIAGNOSTIC DATA (LABS, IMAGING, TESTING) - I reviewed patient records, labs, notes, testing and imaging myself where available.   ASSESSMENT AND PLAN  CELESTIA DUVA is a 75 y.o. female   Essential tremor  Gradually worsening bilateral hands tremor, affecting her daily activity,  Refilled propanolol XR 120 mg daily  Refer her to [facility] movement specialist  Chronic migraine headaches  Concurrent with her worsening depression  Overall she is happy with current migraine control,  Zofran, NSAIDs Fioricet as needed as needed.    Marcial Pacas, M.D. Ph.D.  [facility] [address] [place], [place] [address] Ph: [phone] Fax: [phone]  CC: To Dr. Daryll Brod, Flora Lipps

## 2020-11-16 ENCOUNTER — Telehealth: Payer: Self-pay | Admitting: Neurology

## 2020-11-16 NOTE — Telephone Encounter (Signed)
Received signed patient consent form and Botox charge sheet for 100 units of Botox for G43.709, codes [id number] and [phone]. Gave signed patient consent to medical records for processing. Patient has Medicare A & B as primary which will not require PA. I will call patient to schedule.

## 2020-11-16 NOTE — Telephone Encounter (Signed)
I called patient and got her scheduled for 4/6 at 2:00. Advised pt that I will call her if we have a cancellation before then.

## 2020-11-18 DIAGNOSIS — N390 Urinary tract infection, site not specified: Secondary | ICD-10-CM | POA: Diagnosis not present

## 2020-12-05 ENCOUNTER — Ambulatory Visit: Payer: Medicare Other | Admitting: Pulmonary Disease

## 2020-12-07 ENCOUNTER — Other Ambulatory Visit (HOSPITAL_COMMUNITY): Payer: Self-pay | Admitting: Psychiatry

## 2020-12-07 DIAGNOSIS — F322 Major depressive disorder, single episode, severe without psychotic features: Secondary | ICD-10-CM

## 2020-12-21 ENCOUNTER — Encounter: Payer: Self-pay | Admitting: Neurology

## 2020-12-21 ENCOUNTER — Ambulatory Visit (INDEPENDENT_AMBULATORY_CARE_PROVIDER_SITE_OTHER): Payer: Medicare Other | Admitting: Neurology

## 2020-12-21 VITALS — BP 111/73 | HR 62 | Ht 66.0 in | Wt 201.0 lb

## 2020-12-21 DIAGNOSIS — G43709 Chronic migraine without aura, not intractable, without status migrainosus: Secondary | ICD-10-CM | POA: Diagnosis not present

## 2020-12-21 MED ORDER — UBRELVY 50 MG PO TABS: ORAL_TABLET | ORAL | 6 refills | Status: DC

## 2020-12-21 NOTE — Progress Notes (Signed)
**  Botox 100 units x 2 vial, NDC 4996-9249-32, Lot [id number], Exp 01/2023, office supply.//mck,rn**

## 2020-12-21 NOTE — Progress Notes (Signed)
Chief Complaint  Patient presents with  . Procedure    Migraines - Botox      PATIENT: Hayley [name] DOB: 1946-04-26  Chief Complaint  Patient presents with  . Procedure    Migraines - Botox     HISTORICAL  Hayley [name] is a 75 years old right-handed female, seen in refer by hand surgeon Dr. Daryll Brod, and her primary care PA Flora Lipps for evaluation of neck pain, radiating pain to bilateral shoulder, upper extremity  She has past medical history of spasmodic dysphonia, status post surgical correction, also had a history of hypertension, diabetes, DVT, PE following surgical procedure, most recent was right knee replacement in March 2016, is taking anticoagulation pradaxa, chronic low back pain, previous history of epidural injection for low back pain.  She fell from law mower in June 2016, landed on her back, she was able to got up, finished mowing her lawn, later that day, she noticed worsening low back pain, significant neck pain, radiating pain to her bilateral shoulder, upper extremity, hands, she also noticed bilateral hands paresthesia, burning sensation, subjective weakness, she had worsening urinary urgency, wear pads occasionally, mild unbalanced gait, mild constipation  She was evaluated by [facility] orthopedic surgeon Dr. Rolena Infante for her worsening low back pain, reported MRI of lumbar spine at [facility] in July 2016, was told she is not a surgical candidate, she does has multilevel lumbar degenerative disc disease.  She is also concerned about her bilateral hands tremor, getting worse since 2015, most noticeable when she writes, holding utensils, there was no similar family history  UPDATE August 24th 2016: She continues to complain bilateral neck pain, radiating pain to bilateral shoulder, arm, We have reviewed MRI cervical spine (without) demonstrating: 1. At C3-4: disc bulging and uncovertebral joint hypertrophy and facet hypertrophy with severe left  foraminal stenosis 2. At C4-5: disc bulging and uncovertebral joint hypertrophy with severe biforaminal stenosis 3. At C5-6: disc bulging and uncovertebral joint hypertrophy with moderate right and severe left foraminal stenosis 4. At C6-7: disc bulging and uncovertebral joint hypertrophy with moderate biforaminal stenosis 5. No intrinsic or compressive spinal cord lesions  Propanolol 40 mg twice a day has helped her tremor, she denies significant side effect.  UPDATE June 26th 2017: She continue complains of bilateral lower extremity posturing tremor, significant neck pain, radiating pain to bilateral upper extremity, increased unsteady gait, she has fell few times, worsening bilateral lower extremity paresthesia  UPDATE Sept 1 2017: She has worsening low back pain, it gets worse with any movement, difficulty changing bed linen, she is no longer cooking,   She recently had spinal stimulator trial by [facility] by Dr. Delfino Lovett Rauck,,was deemed to be a potential candidate, she did responding well to the spinal stimulator. She complains of midline constant low back pain, radiating down to right hip, right leg and left leg, getting worse with movement, she has bladder urgency, no bowel incontinence.   She had physical therapy, last in July 2017,    She also complains of migraine, seems to get more frequent since March 2017, she is now having headaches 3-5/week, lasting for 4 hours or longer, with light noise sensitivity nauseous,  She used to get Botox injection as migraine prevention from Dr. Samuel Germany, responded very well, last injection was in 2015, responding well. She is taking Imitrex, Zofran as needed  She is on antibiotics for chronic UTI,  Update June 20 2016: She came in for Botox injection as migraine  prevention, also complains worsening bilateral hands tremor, hope to received EMG guided Botox injection for her right hand tremor.  UPDATE Sep 27 2015: She had sudden  onset of left shoulder pain, neck pain, I personally reviewed x-ray of the cervical spine, evidence of multilevel degenerative disc disease most severe C5-6 C6-7, mild to moderate bilateral foraminal stenosis  She responded well to previous Botox injection in October 2017, reported 75% improvement, over past 3 months, used Imitrex injection once, 4 tablets of Imitrex 25 mg, works well,  She was not sure about the benefit of EMG guided botulism toxin injection for her right hand tremor,  UPDATE December 19 2016: She complains of frequent headaches since December 16 2016, bilateral occipital region, pressure, pounding, nauseous, light sensitivity, she still complains 7 out of 10 headaches, despite multiple dose of Imitrex 25 mg tablets, and Imitrex injection, she has used up all her supplies, she also noticed increased bilateral hands tremor, feeling electronic sensation underneath her skin, to her shoulder down to her arms and legs, it is out of her control, but she denied anxiety or depression,  She has self adjusted her medication recently, she stopped the Wellbutrin by herself for a while, started about 3 weeks ago, also has run out of propanolol,  Husband also reported that she spent most of time in her bed, not feeling well.  UPDATE January 02 2017: Her headache overall is under good control, return for Botox injection as migraine prevention, also extra injection for her right hand tremor, writer's cramp  UPDATE Sept 19 2018: She responded well to previous injection for migraine prevention but did not help her essential tremor much,  UPDATE Jun 25 2018: Her husband has stroke in Dec 2018, she has gone through significant emotional and financial stress, suffered a significant depression, attempted suicidal overdose on Percocet in 2018-01-02, she also presented with significant GI symptoms, nausea vomiting abdominal pain, was treated with IV fluid, diagnosed with pancreatitis, was restarted on her  antidepression Wellbutrin, trazodone, she is now discharged home, still dealing with significant depression, complains of intermittent diarrhea, stomach pain, lack of energy, She continue have frequent headaches, previous Botox works well for her migraine headaches, wants to continue Botox injection.  I also reviewed her geneSight psychotropic pharmacogenomic test result, that is most related to antipsychotics, antidepression use  UPDATE Jul 17 2018: She continues to have significant headaches, came in for Botox injection,  UPDATE Nov 13 2018: She complains of significant depression today, her husband is in nursing home, suffered a heart attack in January 2020, planning on to call in hospice care,  She complains of worsening bilateral hands tremor, previously tried primidone, propanolol all significant improvement, clonazepam 0.5 mg seems to help her some, I checked it [place] controlled substance registry, there is no history of overuse,  Her migraine overall is under good control with Botox injection, she is having migraine 1-2 times each week,  She also complains of diffuse body achy pain  Update 02-Jun-2019: Her husband passed away in 01-03-2019, she has gone through a lot of stress, hospital admission on April 05, 2019 for acute respiratory failure with hypoxia, she was considered to have multifactorial restrictive disease due to chronic basilar scarring of the lung/atelectasis, obstructive sleep apnea, obesity, hypoventilation syndrome, she was discharged home with nighttime oxygen, she was diagnosed with sleep apnea obstructive many years ago, but no longer using her CPAP machine  She continue have intermittent migraine headaches, no longer uses  triptan, Fioricet helps some, previous Botox injection is helpful, especially to alleviated her neck tension  UPDATE Feb 8th 2021: She missed her scheduled Botox injection as migraine prevention, noticed increased migraine headache  over past 2 months, every other day, she has to take Tylenol, or Fioricet as needed   UPDATE Nov 14 2020: Today her main concern is worsening tremor  Tremor, he has a long history of gradual onset fairly symmetric bilateral hands action tremor, was diagnosed with essential tremor, gradually getting worse, previously tried beta-blocker, few times EMG guided Botox injection without helping her symptoms,  " it drives me crazy", she described difficulty holding utensils, put on make-up, she lives by herself, still driving, tremor has put major limitation in activities.  She continue taking Fioricet as needed for migraine headaches, it happened 2-3 times each week, usually starting at the neck, stay behind eyes, with mild light noise sensitivity, nauseous, lasting hours,.  She is now taking Fioricect, daily 2-3/weeks, if she can trated it with ibuprofen/Tyelnol, as needed, she does no take those daily. She has not ake themin in 2 weeks.  She seems to get more migraine headaches since November 2022,  She does not want to continue Botox injection as migraine prevention, did help her, but she does not like the needle pain with Botox injection  UPDATE April 6th 2022: Return for Botox injection as migraine prevention,   REVIEW OF SYSTEMS: Full 14 system review of systems performed and notable only for above   ALLERGIES: Allergies  Allergen Reactions  . Bee Venom Anaphylaxis  . Morphine And Related   . Tape Rash    Rash - only use paper tape  . Etodolac Diarrhea and Nausea And Vomiting  . Morphine Other (See Comments)    Hallucinations  . Other Itching and Swelling    Bee venom  . Poison Ivy Extract  [Poison Ivy Extract] Itching and Swelling  . Propoxyphene Nausea And Vomiting  . Propoxyphene N-Acetaminophen Nausea And Vomiting  . Penicillins Rash    Has patient had a PCN reaction causing immediate rash, facial/tongue/throat swelling, SOB or lightheadedness with hypotension: Yes Has patient  had a PCN reaction causing severe rash involving mucus membranes or skin necrosis: No Has patient had a PCN reaction that required hospitalization No Has patient had a PCN reaction occurring within the last 10 years: No If all of the above answers are "NO", then may proceed with Cephalosporin use.     HOME MEDICATIONS: Current Outpatient Prescriptions  Medication Sig Dispense Refill  . albuterol (PROVENTIL HFA;VENTOLIN HFA) 108 (90 BASE) MCG/ACT inhaler Inhale 1 puff into the lungs every 6 (six) hours as needed for wheezing or shortness of breath.    [name] albuterol (PROVENTIL) (2.5 MG/3ML) 0.083% nebulizer solution Take 2.5 mg by nebulization every 6 (six) hours as needed for wheezing or shortness of breath.    . benzonatate (TESSALON) 100 MG capsule Take 100 mg by mouth 3 (three) times daily as needed for cough.    . clonazePAM (KLONOPIN) 0.5 MG tablet Take 0.5 mg by mouth at bedtime.    . cyclobenzaprine (FLEXERIL) 10 MG tablet Take 10 mg by mouth at bedtime.     [name] EPINEPHrine (EPIPEN) 0.3 mg/0.3 mL DEVI Inject 0.3 mg into the muscle once as needed (Allergic reaction).     . fluorometholone (FML) 0.1 % ophthalmic suspension Place 1 drop into both eyes daily as needed (Eye allergies).   0  . fluticasone (FLONASE) 50 MCG/ACT nasal spray Place  1 spray into both nostrils daily as needed for allergies or rhinitis.    [name] gabapentin (NEURONTIN) 800 MG tablet Take 800-1,600 mg by mouth 2 (two) times daily. 800 in the morning and 1600 at pm    . HYDROcodone-acetaminophen (NORCO/VICODIN) 5-325 MG per tablet Take one to two tablets by mouth every 4 hours as needed for pain. Do not exceed 4gm of Tylenol in 24 hours 360 tablet 0  . levothyroxine (SYNTHROID, LEVOTHROID) 25 MCG tablet Take 25 mcg by mouth every morning.     . methocarbamol (ROBAXIN) 500 MG tablet Take 1 tablet (500 mg total) by mouth every 6 (six) hours as needed for muscle spasms. 90 tablet 0  . mometasone-formoterol (DULERA) 100-5 MCG/ACT  AERO Inhale 2 puffs into the lungs 2 (two) times daily.    [name] nystatin (MYCOSTATIN) 100000 UNIT/ML suspension Take 5 mLs by mouth daily as needed (Yeast).    . ondansetron (ZOFRAN) 4 MG tablet Take 4 mg by mouth every 8 (eight) hours as needed for nausea or vomiting.    . ondansetron (ZOFRAN-ODT) 8 MG disintegrating tablet Take 8 mg by mouth every 8 (eight) hours as needed for nausea or vomiting.    [name] oxyCODONE (OXY IR/ROXICODONE) 5 MG immediate release tablet Take 1-2 tablets (5-10 mg total) by mouth every 3 (three) hours as needed for moderate pain or severe pain. 90 tablet 0  . pantoprazole (PROTONIX) 40 MG tablet Take 40 mg by mouth daily.    . Phenazopyridine HCl (AZO TABS PO) Take 1 tablet by mouth daily as needed (UTI).    . primidone (MYSOLINE) 50 MG tablet Take 50 mg by mouth at bedtime.    . promethazine (PHENERGAN) 25 MG tablet Take 25 mg by mouth every 6 (six) hours as needed for nausea or vomiting.    . Rivaroxaban (XARELTO) 15 MG TABS tablet Take 1 tablet (15 mg total) by mouth 2 (two) times daily with a meal. 60 tablet 5  . rizatriptan (MAXALT) 10 MG tablet Take 10 mg by mouth as needed for migraine (migraine). May repeat in 2 hours if needed    . sertraline (ZOLOFT) 100 MG tablet Take 100 mg by mouth daily.    . sitaGLIPtin (JANUVIA) 100 MG tablet Take 100 mg by mouth every evening.     . SUMAtriptan (IMITREX) 6 MG/0.5ML SOLN injection Inject 6 mg into the skin every 2 (two) hours as needed for migraine or headache. May repeat in 2 hours if headache persists or recurs.    . valACYclovir (VALTREX) 1000 MG tablet Take 2,000 mg by mouth 2 (two) times daily as needed (Fever blisters).   2     PAST MEDICAL HISTORY: Past Medical History:  Diagnosis Date  . Anemia   . Anxiety   . Bronchitis    hx of   . Clotting disorder ([place]) 02-17-14   S/p appendectomy-developed Pulmonary emboli-tx. warfarin-d/c 1 yr ago.  . Colon polyps   . Depression   . Diabetes mellitus without complication  ([place])    "borderline"- oral med  . Disorder of vocal cords    spasmotic dysphonia ,02-17-14 has" whispery voice-low tone"  . Hyperthyroidism   . Kidney stones   . Migraines   . Nausea Nov 15, 2014   cycle of migraine headaches and nausea  . Neuromuscular disorder ([place])    hands and throat"spasmodic dysphonia", tremors - thimb and forefinger   . Obesity, Class III, BMI 40-49.9 (morbid obesity) ([place]) 04/27/2010   02-17-14 reports  some weight loss- intentional  . Osteoarthritis    in back(chronic pain)  . Pelvic pain    02-17-14 "states thinks its back related on left groin"  . Pulmonary emboli [facility])    s/p Appendectomy [facility]13 [facility]  . Reflux   . Right knee pain   . Sleep apnea    cpap - settings at 4   . Toxic goiter     PAST SURGICAL HISTORY: Past Surgical History:  Procedure Laterality Date  . ABDOMINAL HYSTERECTOMY    . APPENDECTOMY     3'[facility] s/p developed  Pulmonary Emboli  . AUGMENTATION MAMMAPLASTY  09/17/1968  . BOTOX INJECTION     for migranes  . CATARACT EXTRACTION, BILATERAL Bilateral 03/2013   . CHOLECYSTECTOMY    . HAND SURGERY  2004   both hands  . KIDNEY STONE SURGERY    . KNEE ARTHROSCOPY  1998   right  . LAPAROSCOPIC APPENDECTOMY  11/27/2011   Procedure: APPENDECTOMY LAPAROSCOPIC;  Surgeon: Adin Hector, MD;  Location: [facility];  Service: General;  Laterality: N/A;  . SPINAL CORD STIMULATOR IMPLANT  06/04/2016   Dr. Alfonse Ras at [facility]  . TOTAL KNEE ARTHROPLASTY Right 12/06/2014   Procedure: RIGHT TOTAL KNEE ARTHROPLASTY;  Surgeon: Gaynelle Arabian, MD;  Location: [facility];  Service: Orthopedics;  Laterality: Right;  . TOTAL KNEE REVISION Left 02/24/2014   Procedure: LEFT TOTAL KNEE REVISION;  Surgeon: Gearlean Alf, MD;  Location: [facility];  Service: Orthopedics;  Laterality: Left;  . vocal cord surgery  02-17-14   01-12-14 done in [place] [facility](s/p selective denervation/reinnervation recurrent laryngeal nerve surgery)  . zenkers diverticulum       FAMILY HISTORY: Family History  Problem Relation Age of Onset  . Heart disease Mother   . Pneumonia Father   . Cirrhosis Father   . Hypertension Father   . Alcoholism Father   . Heart attack Paternal Grandfather   . Heart attack Maternal Grandfather   . Heart attack Paternal Grandmother   . Heart attack Maternal Grandmother   . Breast cancer Daughter     SOCIAL HISTORY:  Social History   Socioeconomic History  . Marital status: Widowed    Spouse name: Not on file  . Number of children: 5  . Years of education: 72  . Highest education level: Not on file  Occupational History  . Occupation: Retired  Tobacco Use  . Smoking status: Former Smoker    Packs/day: 0.25    Years: 5.00    Pack years: 1.25    Types: Cigarettes    Quit date: 01/03/1982    Years since quitting: 38.9  . Smokeless tobacco: Never Used  Vaping Use  . Vaping Use: Never used  Substance and Sexual Activity  . Alcohol use: No  . Drug use: No  . Sexual activity: Not Currently  Other Topics Concern  . Not on file  Social History Narrative   Lives at home alone at this time.   Right-handed.   2 cups caffeine per day.   Social Determinants of Health   Financial Resource Strain: Not on file  Food Insecurity: Not on file  Transportation Needs: Not on file  Physical Activity: Not on file  Stress: Not on file  Social Connections: Not on file  Intimate Partner Violence: Not on file     PHYSICAL EXAM   Vitals:   12/21/20 1501  BP: 111/73  Pulse: 62  Weight: 201 lb (91.2 kg)  Height:  5\' 6"  (1.676 m)   Not recorded     DIAGNOSTIC DATA (LABS, IMAGING, TESTING) - I reviewed patient records, labs, notes, testing and imaging myself where available.   ASSESSMENT AND PLAN  Hayley [name] is a 75 y.o. female   Essential tremor  Gradually worsening bilateral hands tremor, affecting her daily activity,  Refilled propanolol XR 120 mg daily  Refer her to [facility] movement  specialist  Chronic migraine headaches  Concurrent with her worsening depression  Overall she is happy with current migraine control,  Zofran, NSAIDs Fioricet as needed as needed. Botox injection for chronic migraine prevention, injection was performed according to [place] protocol,  5 units of Botox was injected into each side, for 31 injection sites, total of 155 units  Bilateral frontalis 4 injection sites Bilateral corrugate 2 injection sites Procerus 1 injection sites. Bilateral temporalis 8 injection sites Bilateral occipitalis 6 injection sites Bilateral cervical paraspinals 4 injection sites Bilateral upper trapezius 6 injection sites  Extra 45 unites were injected into bilateral cervical paraspinal muscles, left parietotemporal region   Marcial Pacas, M.D. Ph.D.  [facility] [address], [place], [place] [address] Ph: [phone] Fax: [phone]  CC: To Dr. Daryll Brod, Flora Lipps

## 2020-12-22 MED ORDER — ONABOTULINUMTOXINA 100 UNITS IJ SOLR
200.0000 [IU] | Freq: Once | INTRAMUSCULAR | Status: AC
  Administered 2020-12-22: 200 [IU] via INTRAMUSCULAR

## 2020-12-26 ENCOUNTER — Telehealth: Payer: Self-pay | Admitting: Neurology

## 2020-12-26 DIAGNOSIS — N3941 Urge incontinence: Secondary | ICD-10-CM | POA: Diagnosis not present

## 2020-12-26 NOTE — Telephone Encounter (Signed)
She is going to contact [facility] service at [phone] to see if there is a patient assistance program she qualifies. She will call me back if the medication is not affordable.

## 2020-12-26 NOTE — Telephone Encounter (Signed)
Pt called, Patient Assistance told me physician could help me with my medication, Ubrogepant (UBRELVY) 50 MG TABS. Would like a call from the nurse.

## 2020-12-30 DIAGNOSIS — M5416 Radiculopathy, lumbar region: Secondary | ICD-10-CM | POA: Diagnosis not present

## 2020-12-30 DIAGNOSIS — G894 Chronic pain syndrome: Secondary | ICD-10-CM | POA: Diagnosis not present

## 2021-01-25 ENCOUNTER — Other Ambulatory Visit (HOSPITAL_COMMUNITY): Payer: Self-pay | Admitting: Psychiatry

## 2021-01-25 DIAGNOSIS — F322 Major depressive disorder, single episode, severe without psychotic features: Secondary | ICD-10-CM

## 2021-01-25 DIAGNOSIS — F419 Anxiety disorder, unspecified: Secondary | ICD-10-CM

## 2021-01-25 DIAGNOSIS — Z23 Encounter for immunization: Secondary | ICD-10-CM | POA: Diagnosis not present

## 2021-02-01 DIAGNOSIS — G894 Chronic pain syndrome: Secondary | ICD-10-CM | POA: Diagnosis not present

## 2021-02-10 ENCOUNTER — Other Ambulatory Visit (HOSPITAL_COMMUNITY): Payer: Self-pay | Admitting: Psychiatry

## 2021-02-10 DIAGNOSIS — F322 Major depressive disorder, single episode, severe without psychotic features: Secondary | ICD-10-CM

## 2021-03-03 ENCOUNTER — Other Ambulatory Visit (HOSPITAL_COMMUNITY): Payer: Self-pay | Admitting: Psychiatry

## 2021-03-03 DIAGNOSIS — F322 Major depressive disorder, single episode, severe without psychotic features: Secondary | ICD-10-CM

## 2021-03-21 DIAGNOSIS — Z20822 Contact with and (suspected) exposure to covid-19: Secondary | ICD-10-CM | POA: Diagnosis not present

## 2021-03-24 DIAGNOSIS — M5013 Cervical disc disorder with radiculopathy, cervicothoracic region: Secondary | ICD-10-CM | POA: Diagnosis not present

## 2021-03-24 DIAGNOSIS — M5412 Radiculopathy, cervical region: Secondary | ICD-10-CM | POA: Diagnosis not present

## 2021-03-29 ENCOUNTER — Ambulatory Visit: Payer: Medicare Other | Admitting: Neurology

## 2021-04-17 DIAGNOSIS — M5136 Other intervertebral disc degeneration, lumbar region: Secondary | ICD-10-CM | POA: Diagnosis not present

## 2021-04-17 DIAGNOSIS — M5030 Other cervical disc degeneration, unspecified cervical region: Secondary | ICD-10-CM | POA: Diagnosis not present

## 2021-04-17 DIAGNOSIS — M5416 Radiculopathy, lumbar region: Secondary | ICD-10-CM | POA: Diagnosis not present

## 2021-04-19 DIAGNOSIS — R6883 Chills (without fever): Secondary | ICD-10-CM | POA: Diagnosis not present

## 2021-04-19 DIAGNOSIS — Z20822 Contact with and (suspected) exposure to covid-19: Secondary | ICD-10-CM | POA: Diagnosis not present

## 2021-04-19 DIAGNOSIS — E119 Type 2 diabetes mellitus without complications: Secondary | ICD-10-CM | POA: Diagnosis not present

## 2021-04-19 DIAGNOSIS — D538 Other specified nutritional anemias: Secondary | ICD-10-CM | POA: Diagnosis not present

## 2021-04-19 DIAGNOSIS — R109 Unspecified abdominal pain: Secondary | ICD-10-CM | POA: Diagnosis not present

## 2021-04-19 DIAGNOSIS — R5383 Other fatigue: Secondary | ICD-10-CM | POA: Diagnosis not present

## 2021-04-19 DIAGNOSIS — K529 Noninfective gastroenteritis and colitis, unspecified: Secondary | ICD-10-CM | POA: Diagnosis not present

## 2021-04-26 ENCOUNTER — Encounter (HOSPITAL_COMMUNITY): Payer: Self-pay | Admitting: Psychiatry

## 2021-04-26 ENCOUNTER — Telehealth (INDEPENDENT_AMBULATORY_CARE_PROVIDER_SITE_OTHER): Payer: Medicare Other | Admitting: Psychiatry

## 2021-04-26 ENCOUNTER — Other Ambulatory Visit: Payer: Self-pay

## 2021-04-26 DIAGNOSIS — F322 Major depressive disorder, single episode, severe without psychotic features: Secondary | ICD-10-CM

## 2021-04-26 DIAGNOSIS — F419 Anxiety disorder, unspecified: Secondary | ICD-10-CM

## 2021-04-26 MED ORDER — VORTIOXETINE HBR 10 MG PO TABS: 10.0000 mg | ORAL_TABLET | Freq: Every day | ORAL | 0 refills | Status: DC

## 2021-04-26 NOTE — Progress Notes (Signed)
Virtual Visit via Telephone Note  I connected with Hayley [name] on 04/26/21 at 11:20 AM EDT by telephone and verified that I am speaking with the correct person using two identifiers.  Location: Patient: Home Provider: Home Office   I discussed the limitations, risks, security and privacy concerns of performing an evaluation and management service by telephone and the availability of in person appointments. I also discussed with the patient that there may be a patient responsible charge related to this service. The patient expressed understanding and agreed to proceed.   History of Present Illness: Patient is evaluated by phone session.  She was last evaluated back in February.  She missed her appointment.  However she is still taking Trintellix 10 mg.  Recently she tried 20 mg but she ran out and then back on 10 mg leftover prescription.  She sometimes feels very fatigued, tired and lack of motivation.  She is not sure what causes but recently had a blood work and she was told she had low vitamin D.  She may require vitamin D injection but she is not sure.  She do not recall very well all the lab details.  Her PCP is Dr. [name] at [facility].  She also saw neurologist for migraine headache but not getting any Botox injection because she did not have severe migraine attack.  She is not sure about her weight but admitted she may have lost weight.  She denies any crying spells, feeling of hopelessness or worthlessness.  She denies any suicidal thoughts.  Patient reported her family is good and she is in touch with her daughter who lives in the time.  Her son lives in [place] but lately he is traveling and having vacations.  Patient reported her appetite is okay.  She has chronic tremors and in the past we have tried Lamictal but she reported it caused worsening of the tremors.  She is still take trazodone 150 mg at bedtime however no new prescription was given since February but  patient reported she still have refills remaining.  She does not need a new prescription of trazodone.    Past Psychiatric History: Reviewed. H/O depression and anxiety.  H/O suicidal attempt on Percocet in March 2019.  Took Effexor in her 87s but stopped after working for a while.  Tried Zoloft, Paxil and Wellbutrin from PCP but did not work. Did IOP, PHP and given Prozac but no improvement. Tried lexapro caused GI s/e, Lamictal caused increase tremors.   Psychiatric Specialty Exam: Physical Exam  Review of Systems  Neurological:  Positive for tremors.   Last menstrual period 09/18/1979.There is no height or weight on file to calculate BMI.  General Appearance: NA  Eye Contact:  NA  Speech:  Slow  Volume:  Decreased  Mood:  Dysphoric  Affect:  NA  Thought Process:  Goal Directed  Orientation:  Full (Time, Place, and Person)  Thought Content:  Rumination  Suicidal Thoughts:  No  Homicidal Thoughts:  No  Memory:  Immediate;   Fair Recent;   Fair Remote;   Fair  Judgement:  Fair  Insight:  Shallow  Psychomotor Activity:  NA  Concentration:  Concentration: Fair and Attention Span: Fair  Recall:  [employer] of Knowledge:  Fair  Language:  Fair  Akathisia:  No  Handed:  Right  AIMS (if indicated):     Assets:  Communication Skills Desire for Improvement Housing  ADL's:  Intact  Cognition:  WNL  Sleep:   poor, dreams      Assessment and Plan: Major depressive disorder, recurrent.  Anxiety.  Patient reported taking the Trintellix initially 20 mg but now 10 mg and like to keep the 10 mg for now until to be evaluated in the month to see if does need to be adjusted.  I reviewed notes from neurologist.  Patient had a physical at her PCP office but need more blood work coming up in few weeks.  She was told she had low vitamin D and may require injections of vitamin D.  We will contact her PCP Dr. Justin Mend at [facility] phone number [phone] to get the lab results  and current list of medication.  Patient admitted sometimes not remember very well.  She like to get the prescription of Trintellix 10 mg daily and follow up in 4 weeks.  She has remained in trazodone 150 mg and does not need a new prescription.  I recommend to call us back if he has any question or any concern.  Follow-up in 4 weeks.  Reinforce keeping the appointments in the future for continued day of care.  Follow Up Instructions:    I discussed the assessment and treatment plan with the patient. The patient was provided an opportunity to ask questions and all were answered. The patient agreed with the plan and demonstrated an understanding of the instructions.   The patient was advised to call back or seek an in-person evaluation if the symptoms worsen or if the condition fails to improve as anticipated.  I provided 19 minutes of non-face-to-face time during this encounter.   [name], MD

## 2021-04-27 ENCOUNTER — Telehealth (HOSPITAL_COMMUNITY): Payer: Self-pay | Admitting: *Deleted

## 2021-04-27 NOTE — Telephone Encounter (Signed)
Lab results received from [facility] at [place]. CBC and CMP. HgbA1C high at 6.7, MPV low at 7.3, and glucose high at 144 (unknown if pt was fasting), and Vitamin B12 low at 141. All others wln. Will scan to chart.

## 2021-05-15 DIAGNOSIS — N3941 Urge incontinence: Secondary | ICD-10-CM | POA: Diagnosis not present

## 2021-05-15 DIAGNOSIS — R800 Isolated proteinuria: Secondary | ICD-10-CM | POA: Diagnosis not present

## 2021-05-16 ENCOUNTER — Other Ambulatory Visit: Payer: Self-pay | Admitting: Student

## 2021-05-16 DIAGNOSIS — M5416 Radiculopathy, lumbar region: Secondary | ICD-10-CM

## 2021-05-16 DIAGNOSIS — M5412 Radiculopathy, cervical region: Secondary | ICD-10-CM | POA: Diagnosis not present

## 2021-05-23 ENCOUNTER — Ambulatory Visit
Admission: RE | Admit: 2021-05-23 | Discharge: 2021-05-23 | Disposition: A | Discharge status: Home / Self Care | Payer: Medicare Other | Source: Ambulatory Visit | Attending: Student | Admitting: Student

## 2021-05-23 DIAGNOSIS — M48061 Spinal stenosis, lumbar region without neurogenic claudication: Secondary | ICD-10-CM | POA: Diagnosis not present

## 2021-05-23 DIAGNOSIS — M419 Scoliosis, unspecified: Secondary | ICD-10-CM | POA: Diagnosis not present

## 2021-05-23 DIAGNOSIS — M5412 Radiculopathy, cervical region: Secondary | ICD-10-CM

## 2021-05-23 DIAGNOSIS — M5416 Radiculopathy, lumbar region: Secondary | ICD-10-CM

## 2021-05-23 DIAGNOSIS — M4802 Spinal stenosis, cervical region: Secondary | ICD-10-CM | POA: Diagnosis not present

## 2021-05-23 DIAGNOSIS — M5011 Cervical disc disorder with radiculopathy,  high cervical region: Secondary | ICD-10-CM | POA: Diagnosis not present

## 2021-05-23 DIAGNOSIS — M4602 Spinal enthesopathy, cervical region: Secondary | ICD-10-CM | POA: Diagnosis not present

## 2021-05-23 MED ORDER — ONDANSETRON HCL 4 MG/2ML IJ SOLN: 4.0000 mg | Freq: Once | INTRAMUSCULAR | Status: DC | PRN

## 2021-05-23 MED ORDER — MEPERIDINE HCL 50 MG/ML IJ SOLN: 50.0000 mg | Freq: Once | INTRAMUSCULAR | Status: DC | PRN

## 2021-05-23 MED ORDER — DIAZEPAM 5 MG PO TABS
5.0000 mg | ORAL_TABLET | Freq: Once | ORAL | Status: AC
  Administered 2021-05-23: 5 mg via ORAL

## 2021-05-23 MED ORDER — IOPAMIDOL (ISOVUE-M 300) INJECTION 61%
10.0000 mL | Freq: Once | INTRAMUSCULAR | Status: AC
  Administered 2021-05-23: 10 mL via INTRATHECAL

## 2021-05-23 NOTE — Progress Notes (Signed)
Pt reports she has a spinal cord stimulator but it has not worked in "a while" due to the "batteries being dead".

## 2021-05-23 NOTE — Discharge Instructions (Signed)

## 2021-05-31 ENCOUNTER — Telehealth (INDEPENDENT_AMBULATORY_CARE_PROVIDER_SITE_OTHER): Payer: Medicare Other | Admitting: Psychiatry

## 2021-05-31 ENCOUNTER — Encounter (HOSPITAL_COMMUNITY): Payer: Self-pay | Admitting: Psychiatry

## 2021-05-31 ENCOUNTER — Other Ambulatory Visit: Payer: Self-pay

## 2021-05-31 VITALS — Wt 200.0 lb

## 2021-05-31 DIAGNOSIS — F419 Anxiety disorder, unspecified: Secondary | ICD-10-CM

## 2021-05-31 DIAGNOSIS — F322 Major depressive disorder, single episode, severe without psychotic features: Secondary | ICD-10-CM

## 2021-05-31 MED ORDER — TRAZODONE HCL 150 MG PO TABS: 150.0000 mg | ORAL_TABLET | Freq: Every day | ORAL | 0 refills | Status: DC

## 2021-05-31 MED ORDER — LORAZEPAM 0.5 MG PO TABS: 0.5000 mg | ORAL_TABLET | Freq: Every day | ORAL | 1 refills | Status: DC | PRN

## 2021-05-31 MED ORDER — VORTIOXETINE HBR 10 MG PO TABS: 10.0000 mg | ORAL_TABLET | Freq: Every day | ORAL | 1 refills | Status: DC

## 2021-05-31 NOTE — Progress Notes (Signed)
Virtual Visit via Telephone Note  I connected with Hayley [name] on 05/31/21 at  3:00 PM EDT by telephone and verified that I am speaking with the correct person using two identifiers.  Location: Patient: Home Provider: Home office   I discussed the limitations, risks, security and privacy concerns of performing an evaluation and management service by telephone and the availability of in person appointments. I also discussed with the patient that there may be a patient responsible charge related to this service. The patient expressed understanding and agreed to proceed.   History of Present Illness: Patient is evaluated by phone session.  She is taking Trintellix 10 mg along with trazodone 150 mg at bedtime.  She admitted there are bouts of anxiety and nervousness which she is not sure what triggered but it does not happen every day.  She is sleeping better.  She had a blood work at [place] and her hemoglobin A1c was 6.7.  Recently she had a appointment with neurosurgery for her back and she has a myelogram but results are pending.  Patient denies any crying spells or any feeling of hopelessness or worthlessness.  Her son lives in [place] and patient also had an contact with her daughter.  Patient reported her appetite is okay.  She denies any anhedonia.  She has mild tremors and in the past when Trintellix increased her tremors get worse.  We also tried Lamictal but that did not help her and cause increased tremors.  She is concerned about having bouts of anxiety which really comes and goes.    Past Psychiatric History: Reviewed. H/O depression and anxiety.  H/O suicidal attempt on Percocet in March 2019.  Took Effexor in her 59s but stopped after working for a while.  Tried Zoloft, Paxil and Wellbutrin from PCP but did not work. Did IOP, PHP and given Prozac but no improvement. Tried lexapro caused GI s/e, Lamictal caused increase tremors.   Psychiatric Specialty Exam: Physical Exam   Review of Systems  Weight 200 lb (90.7 kg), last menstrual period 09/18/1979.There is no height or weight on file to calculate BMI.  General Appearance: NA  Eye Contact:  NA  Speech:  Slow  Volume:  Decreased  Mood:  Anxious  Affect:  NA  Thought Process:  Goal Directed  Orientation:  Full (Time, Place, and Person)  Thought Content:  Rumination  Suicidal Thoughts:  No  Homicidal Thoughts:  No  Memory:  Immediate;   Good Recent;   Fair Remote;   Good  Judgement:  Fair  Insight:  Present  Psychomotor Activity:  NA  Concentration:  Concentration: Fair and Attention Span: Fair  Recall:  [employer] of Knowledge:  Fair  Language:  Fair  Akathisia:  No  Handed:  Right  AIMS (if indicated):     Assets:  Communication Skills Desire for Improvement Housing  ADL's:  Intact  Cognition:  WNL  Sleep:   fair      Assessment and Plan: Major depressive episode, recurrent.  Anxiety.  Patient is still have resolved anxiety which she is not sure the triggers.  She is reluctant to go up on Trintellix as in the past caused tremors.  I recommend to try lorazepam 0.5 mg to take as needed when she has these anxiety episodes.  She has also Vistaril for itching but she has not taken it in a while.  I encouraged watching her calorie intake and walking.  Her last hemoglobin A1c was 6.7.  Continue Trintellix 10 mg daily, trazodone 150 mg at bedtime and we will try Ativan 0.5 mg to take as needed maximum 15 in a month with additional 1 refill.  Patient like to have a follow-up in 2 months.  I recommend to call us back if she has any question, concern or if she feels worsening of the symptoms.  Follow Up Instructions:    I discussed the assessment and treatment plan with the patient. The patient was provided an opportunity to ask questions and all were answered. The patient agreed with the plan and demonstrated an understanding of the instructions.   The patient was advised to call back or seek an  in-person evaluation if the symptoms worsen or if the condition fails to improve as anticipated.  I provided 20 minutes of non-face-to-face time during this encounter.   [name], MD

## 2021-06-01 DIAGNOSIS — Z6832 Body mass index (BMI) 32.0-32.9, adult: Secondary | ICD-10-CM | POA: Diagnosis not present

## 2021-06-01 DIAGNOSIS — M5416 Radiculopathy, lumbar region: Secondary | ICD-10-CM | POA: Diagnosis not present

## 2021-06-19 DIAGNOSIS — G894 Chronic pain syndrome: Secondary | ICD-10-CM | POA: Diagnosis not present

## 2021-06-19 DIAGNOSIS — M5416 Radiculopathy, lumbar region: Secondary | ICD-10-CM | POA: Diagnosis not present

## 2021-06-23 DIAGNOSIS — M5416 Radiculopathy, lumbar region: Secondary | ICD-10-CM | POA: Diagnosis not present

## 2021-07-27 ENCOUNTER — Other Ambulatory Visit (HOSPITAL_COMMUNITY): Payer: Self-pay | Admitting: Psychiatry

## 2021-07-27 DIAGNOSIS — F419 Anxiety disorder, unspecified: Secondary | ICD-10-CM

## 2021-07-27 DIAGNOSIS — F322 Major depressive disorder, single episode, severe without psychotic features: Secondary | ICD-10-CM

## 2021-07-28 DIAGNOSIS — M5033 Other cervical disc degeneration, cervicothoracic region: Secondary | ICD-10-CM | POA: Diagnosis not present

## 2021-07-28 DIAGNOSIS — M5412 Radiculopathy, cervical region: Secondary | ICD-10-CM | POA: Diagnosis not present

## 2021-08-03 DIAGNOSIS — Z23 Encounter for immunization: Secondary | ICD-10-CM | POA: Diagnosis not present

## 2021-08-07 ENCOUNTER — Other Ambulatory Visit (HOSPITAL_COMMUNITY): Payer: Self-pay | Admitting: *Deleted

## 2021-08-07 DIAGNOSIS — F322 Major depressive disorder, single episode, severe without psychotic features: Secondary | ICD-10-CM

## 2021-08-07 DIAGNOSIS — F419 Anxiety disorder, unspecified: Secondary | ICD-10-CM

## 2021-08-07 MED ORDER — VORTIOXETINE HBR 10 MG PO TABS
10.0000 mg | ORAL_TABLET | Freq: Every day | ORAL | 1 refills | Status: DC
Start: 2021-08-07 — End: 2021-09-22

## 2021-08-17 DIAGNOSIS — Z96653 Presence of artificial knee joint, bilateral: Secondary | ICD-10-CM | POA: Diagnosis not present

## 2021-08-23 ENCOUNTER — Other Ambulatory Visit: Payer: Self-pay | Admitting: Neurology

## 2021-08-23 ENCOUNTER — Telehealth: Payer: Self-pay | Admitting: Neurology

## 2021-08-23 MED ORDER — UBRELVY 50 MG PO TABS: ORAL_TABLET | ORAL | 0 refills | Status: DC

## 2021-08-23 NOTE — Telephone Encounter (Signed)
Pending appt 09/20/2021. Refill sent to pharmacy.

## 2021-08-23 NOTE — Telephone Encounter (Signed)
Pt requesting refill for Ubrogepant (UBRELVY) 50 MG TABS. Pharmacy [id number] - [place], [place] - [address]

## 2021-08-24 ENCOUNTER — Telehealth: Payer: Self-pay | Admitting: *Deleted

## 2021-08-24 ENCOUNTER — Other Ambulatory Visit: Payer: Self-pay | Admitting: *Deleted

## 2021-08-24 MED ORDER — NURTEC 75 MG PO TBDP: ORAL_TABLET | ORAL | 5 refills | Status: DC

## 2021-08-24 NOTE — Telephone Encounter (Signed)
Hayley [name] is no longer on her plan formulary. Nurtec is preferred. Per vo by Dr. [name], okay to provide new prescription for Nurtec. Rx sent to the pharmacy. Ubrelvy voided. I left patient a voicemail updated her with this change. Provided our number to call back with any questions.  [facility] also called [facility]. Nurtec will need a PA.   She has coverage through [employer] [phone])  YD#[id number] BIN: [id number] GROUP: [id number] PCN: [id number]  A case will be initiated on covermymeds.

## 2021-08-24 NOTE — Telephone Encounter (Signed)
Covermymeds is currently unavailable.  I called [employer]. PA for nurtec was completed via phone and approved from 07/25/2021-08/24/2022. Case #: [id number].

## 2021-08-29 ENCOUNTER — Telehealth (HOSPITAL_COMMUNITY): Payer: Medicare Other | Admitting: Psychiatry

## 2021-08-30 ENCOUNTER — Telehealth (HOSPITAL_COMMUNITY): Payer: Medicare Other | Admitting: Psychiatry

## 2021-09-05 ENCOUNTER — Other Ambulatory Visit (HOSPITAL_COMMUNITY): Payer: Self-pay | Admitting: Psychiatry

## 2021-09-05 DIAGNOSIS — F322 Major depressive disorder, single episode, severe without psychotic features: Secondary | ICD-10-CM

## 2021-09-12 ENCOUNTER — Other Ambulatory Visit (HOSPITAL_COMMUNITY): Payer: Self-pay | Admitting: *Deleted

## 2021-09-12 DIAGNOSIS — F322 Major depressive disorder, single episode, severe without psychotic features: Secondary | ICD-10-CM

## 2021-09-12 MED ORDER — TRAZODONE HCL 150 MG PO TABS: 150.0000 mg | ORAL_TABLET | Freq: Every day | ORAL | 0 refills | Status: DC

## 2021-09-20 ENCOUNTER — Ambulatory Visit (INDEPENDENT_AMBULATORY_CARE_PROVIDER_SITE_OTHER): Payer: Medicare Other | Admitting: Neurology

## 2021-09-20 ENCOUNTER — Other Ambulatory Visit: Payer: Self-pay

## 2021-09-20 ENCOUNTER — Encounter: Payer: Self-pay | Admitting: Neurology

## 2021-09-20 VITALS — BP 106/68 | HR 74 | Ht 62.0 in | Wt 200.0 lb

## 2021-09-20 DIAGNOSIS — G43709 Chronic migraine without aura, not intractable, without status migrainosus: Secondary | ICD-10-CM

## 2021-09-20 DIAGNOSIS — G250 Essential tremor: Secondary | ICD-10-CM

## 2021-09-20 MED ORDER — PROPRANOLOL HCL 10 MG PO TABS: 10.0000 mg | ORAL_TABLET | Freq: Two times a day (BID) | ORAL | 11 refills | Status: AC | PRN

## 2021-09-20 MED ORDER — PROPRANOLOL HCL ER 120 MG PO CP24: 120.0000 mg | ORAL_CAPSULE | Freq: Every day | ORAL | 3 refills | Status: DC

## 2021-09-20 NOTE — Progress Notes (Signed)
Botox 200 u [id number] Exp-11/2023  B/B

## 2021-09-20 NOTE — Progress Notes (Addendum)
Chief Complaint  Patient presents with   Procedure    Botox 200u      PATIENT: Hayley [name] DOB: November 15, 1945  Chief Complaint  Patient presents with   Procedure    Botox 200u     HISTORICAL  Hayley [name] is a 76 years old right-handed female, seen in refer by hand surgeon Dr. Daryll Brod, and her primary care PA Flora Lipps for evaluation of neck pain, radiating pain to bilateral shoulder, upper extremity  She has past medical history of spasmodic dysphonia, status post surgical correction, also had a history of hypertension, diabetes, DVT, PE following surgical procedure, most recent was right knee replacement in March 2016, is taking anticoagulation pradaxa, chronic low back pain, previous history of epidural injection for low back pain.  She fell from law mower in June 2016, landed on her back, she was able to got up, finished mowing her lawn, later that day, she noticed worsening low back pain, significant neck pain, radiating pain to her bilateral shoulder, upper extremity, hands, she also noticed bilateral hands paresthesia, burning sensation, subjective weakness, she had worsening urinary urgency, wear pads occasionally, mild unbalanced gait, mild constipation  She was evaluated by [facility] orthopedic surgeon Dr. Rolena Infante for her worsening low back pain, reported MRI of lumbar spine at [facility] in July 2016, was told she is not a surgical candidate, she does has multilevel lumbar degenerative disc disease.  She is also concerned about her bilateral hands tremor, getting worse since 2015, most noticeable when she writes, holding utensils, there was no similar family history  UPDATE August 24th 2016: She continues to complain bilateral neck pain, radiating pain to bilateral shoulder, arm, We have reviewed MRI cervical spine (without) demonstrating: 1. At C3-4: disc bulging and uncovertebral joint hypertrophy and facet hypertrophy with severe left foraminal stenosis 2.  At C4-5: disc bulging and uncovertebral joint hypertrophy with severe biforaminal stenosis 3. At C5-6: disc bulging and uncovertebral joint hypertrophy with moderate right and severe left foraminal stenosis 4. At C6-7: disc bulging and uncovertebral joint hypertrophy with moderate biforaminal stenosis 5. No intrinsic or compressive spinal cord lesions  Propanolol 40 mg twice a day has helped her tremor, she denies significant side effect.  UPDATE June 26th 2017: She continue complains of bilateral lower extremity posturing tremor, significant neck pain, radiating pain to bilateral upper extremity, increased unsteady gait, she has fell few times, worsening bilateral lower extremity paresthesia  UPDATE Sept 1 2017: She has worsening low back pain, it gets worse with any movement, difficulty changing bed linen, she is no longer cooking,   She recently had spinal stimulator trial by [facility] by Dr. Delfino Lovett Rauck,,was deemed to be a potential candidate, she did responding well to the spinal stimulator. She complains of midline constant low back pain, radiating down to right hip, right leg and left leg, getting worse with movement, she has bladder urgency, no bowel incontinence.   She had physical therapy, last in July 2017,    She also complains of migraine, seems to get more frequent since March 2017, she is now having headaches 3-5/week, lasting for 4 hours or longer, with light noise sensitivity nauseous,  She used to get Botox injection as migraine prevention from Dr. Samuel Germany, responded very well, last injection was in 2015, responding well. She is taking Imitrex, Zofran as needed  She is on antibiotics for chronic UTI,  Update June 20 2016: She came in for Botox injection as migraine prevention, also  complains worsening bilateral hands tremor, hope to received EMG guided Botox injection for her right hand tremor.  UPDATE Sep 27 2015: She had sudden onset of left shoulder  pain, neck pain, I personally reviewed x-ray of the cervical spine, evidence of multilevel degenerative disc disease most severe C5-6 C6-7, mild to moderate bilateral foraminal stenosis  She responded well to previous Botox injection in October 2017, reported 75% improvement, over past 3 months, used Imitrex injection once, 4 tablets of Imitrex 25 mg, works well,  She was not sure about the benefit of EMG guided botulism toxin injection for her right hand tremor,  UPDATE December 19 2016: She complains of frequent headaches since December 16 2016, bilateral occipital region, pressure, pounding, nauseous, light sensitivity, she still complains 7 out of 10 headaches, despite multiple dose of Imitrex 25 mg tablets, and Imitrex injection, she has used up all her supplies, she also noticed increased bilateral hands tremor, feeling electronic sensation underneath her skin, to her shoulder down to her arms and legs, it is out of her control, but she denied anxiety or depression,  She has self adjusted her medication recently, she stopped the Wellbutrin by herself for a while, started about 3 weeks ago, also has run out of propanolol,  Husband also reported that she spent most of time in her bed, not feeling well.  UPDATE January 02 2017: Her headache overall is under good control, return for Botox injection as migraine prevention, also extra injection for her right hand tremor, writer's cramp  UPDATE Sept 19 2018: She responded well to previous injection for migraine prevention but did not help her essential tremor much,  UPDATE Jun 25 2018: Her husband has stroke in Dec 2018, she has gone through significant emotional and financial stress, suffered a significant depression, attempted suicidal overdose on Percocet in 2017/12/13, she also presented with significant GI symptoms, nausea vomiting abdominal pain, was treated with IV fluid, diagnosed with pancreatitis, was restarted on her antidepression Wellbutrin,  trazodone, she is now discharged home, still dealing with significant depression, complains of intermittent diarrhea, stomach pain, lack of energy, She continue have frequent headaches, previous Botox works well for her migraine headaches, wants to continue Botox injection.  I also reviewed her geneSight psychotropic pharmacogenomic test result, that is most related to antipsychotics, antidepression use  UPDATE Jul 17 2018: She continues to have significant headaches, came in for Botox injection,  UPDATE Nov 13 2018: She complains of significant depression today, her husband is in nursing home, suffered a heart attack in January 2020, planning on to call in hospice care,  She complains of worsening bilateral hands tremor, previously tried primidone, propanolol all significant improvement, clonazepam 0.5 mg seems to help her some, I checked it [place] controlled substance registry, there is no history of overuse,  Her migraine overall is under good control with Botox injection, she is having migraine 1-2 times each week,  She also complains of diffuse body achy pain  Update 05-17-2019: Her husband passed away in 12-14-2018, she has gone through a lot of stress, hospital admission on April 05, 2019 for acute respiratory failure with hypoxia, she was considered to have multifactorial restrictive disease due to chronic basilar scarring of the lung/atelectasis, obstructive sleep apnea, obesity, hypoventilation syndrome, she was discharged home with nighttime oxygen, she was diagnosed with sleep apnea obstructive many years ago, but no longer using her CPAP machine  She continue have intermittent migraine headaches, no longer uses triptan, Fioricet  helps some, previous Botox injection is helpful, especially to alleviated her neck tension  UPDATE Feb 8th 2021: She missed her scheduled Botox injection as migraine prevention, noticed increased migraine headache over past 2 months, every  other day, she has to take Tylenol, or Fioricet as needed   UPDATE Nov 14 2020: Today her main concern is worsening tremor  Tremor, he has a long history of gradual onset fairly symmetric bilateral hands action tremor, was diagnosed with essential tremor, gradually getting worse, previously tried beta-blocker, few times EMG guided Botox injection without helping her symptoms,  " it drives me crazy", she described difficulty holding utensils, put on make-up, she lives by herself, still driving, tremor has put major limitation in activities.  She continue taking Fioricet as needed for migraine headaches, it happened 2-3 times each week, usually starting at the neck, stay behind eyes, with mild light noise sensitivity, nauseous, lasting hours,.  She is now taking Fioricect, daily 2-3/weeks, if she can trated it with ibuprofen/Tyelnol, as needed, she does no take those daily. She has not ake themin in 2 weeks.  She seems to get more migraine headaches since November 2022,  She does not want to continue Botox injection as migraine prevention, did help her, but she does not like the needle pain with Botox injection  UPDATE April 6th 2022: Return for Botox injection as migraine prevention,  UPDATE Jan 5th 2022: Botox injection continue to help her headaches, she had to use Nurtec once or twice every week, seems to help her headache some   REVIEW OF SYSTEMS: Full 14 system review of systems performed and notable only for above   ALLERGIES: Allergies  Allergen Reactions   Bee Venom Anaphylaxis   Morphine And Related    Tape Rash    Rash - only use paper tape   Etodolac Diarrhea and Nausea And Vomiting   Morphine Other (See Comments)    Hallucinations   Other Itching and Swelling    Bee venom   Poison Ivy Extract  [Poison Ivy Extract] Itching and Swelling   Propoxyphene Nausea And Vomiting   Propoxyphene N-Acetaminophen Nausea And Vomiting   Penicillins Rash    Has patient had a PCN  reaction causing immediate rash, facial/tongue/throat swelling, SOB or lightheadedness with hypotension: Yes Has patient had a PCN reaction causing severe rash involving mucus membranes or skin necrosis: No Has patient had a PCN reaction that required hospitalization No Has patient had a PCN reaction occurring within the last 10 years: No If all of the above answers are "NO", then may proceed with Cephalosporin use.     HOME MEDICATIONS: Current Outpatient Prescriptions  Medication Sig Dispense Refill   albuterol (PROVENTIL HFA;VENTOLIN HFA) 108 (90 BASE) MCG/ACT inhaler Inhale 1 puff into the lungs every 6 (six) hours as needed for wheezing or shortness of breath.     albuterol (PROVENTIL) (2.5 MG/3ML) 0.083% nebulizer solution Take 2.5 mg by nebulization every 6 (six) hours as needed for wheezing or shortness of breath.     benzonatate (TESSALON) 100 MG capsule Take 100 mg by mouth 3 (three) times daily as needed for cough.     clonazePAM (KLONOPIN) 0.5 MG tablet Take 0.5 mg by mouth at bedtime.     cyclobenzaprine (FLEXERIL) 10 MG tablet Take 10 mg by mouth at bedtime.      EPINEPHrine (EPIPEN) 0.3 mg/0.3 mL DEVI Inject 0.3 mg into the muscle once as needed (Allergic reaction).      fluorometholone (FML) 0.1 %  ophthalmic suspension Place 1 drop into both eyes daily as needed (Eye allergies).   0   fluticasone (FLONASE) 50 MCG/ACT nasal spray Place 1 spray into both nostrils daily as needed for allergies or rhinitis.     gabapentin (NEURONTIN) 800 MG tablet Take 800-1,600 mg by mouth 2 (two) times daily. 800 in the morning and 1600 at pm     HYDROcodone-acetaminophen (NORCO/VICODIN) 5-325 MG per tablet Take one to two tablets by mouth every 4 hours as needed for pain. Do not exceed 4gm of Tylenol in 24 hours 360 tablet 0   levothyroxine (SYNTHROID, LEVOTHROID) 25 MCG tablet Take 25 mcg by mouth every morning.      methocarbamol (ROBAXIN) 500 MG tablet Take 1 tablet (500 mg total) by mouth  every 6 (six) hours as needed for muscle spasms. 90 tablet 0   mometasone-formoterol (DULERA) 100-5 MCG/ACT AERO Inhale 2 puffs into the lungs 2 (two) times daily.     nystatin (MYCOSTATIN) 100000 UNIT/ML suspension Take 5 mLs by mouth daily as needed (Yeast).     ondansetron (ZOFRAN) 4 MG tablet Take 4 mg by mouth every 8 (eight) hours as needed for nausea or vomiting.     ondansetron (ZOFRAN-ODT) 8 MG disintegrating tablet Take 8 mg by mouth every 8 (eight) hours as needed for nausea or vomiting.     oxyCODONE (OXY IR/ROXICODONE) 5 MG immediate release tablet Take 1-2 tablets (5-10 mg total) by mouth every 3 (three) hours as needed for moderate pain or severe pain. 90 tablet 0   pantoprazole (PROTONIX) 40 MG tablet Take 40 mg by mouth daily.     Phenazopyridine HCl (AZO TABS PO) Take 1 tablet by mouth daily as needed (UTI).     primidone (MYSOLINE) 50 MG tablet Take 50 mg by mouth at bedtime.     promethazine (PHENERGAN) 25 MG tablet Take 25 mg by mouth every 6 (six) hours as needed for nausea or vomiting.     Rivaroxaban (XARELTO) 15 MG TABS tablet Take 1 tablet (15 mg total) by mouth 2 (two) times daily with a meal. 60 tablet 5   rizatriptan (MAXALT) 10 MG tablet Take 10 mg by mouth as needed for migraine (migraine). May repeat in 2 hours if needed     sertraline (ZOLOFT) 100 MG tablet Take 100 mg by mouth daily.     sitaGLIPtin (JANUVIA) 100 MG tablet Take 100 mg by mouth every evening.      SUMAtriptan (IMITREX) 6 MG/0.5ML SOLN injection Inject 6 mg into the skin every 2 (two) hours as needed for migraine or headache. May repeat in 2 hours if headache persists or recurs.     valACYclovir (VALTREX) 1000 MG tablet Take 2,000 mg by mouth 2 (two) times daily as needed (Fever blisters).   2     PAST MEDICAL HISTORY: Past Medical History:  Diagnosis Date   Anemia    Anxiety    Bronchitis    hx of    Clotting disorder ([place]) 02-17-14   S/p appendectomy-developed Pulmonary emboli-tx.  warfarin-d/c 1 yr ago.   Colon polyps    Depression    Diabetes mellitus without complication ([place])    "borderline"- oral med   Disorder of vocal cords    spasmotic dysphonia ,02-17-14 has" whispery voice-low tone"   Hyperthyroidism    Kidney stones    Migraines    Nausea Nov 15, 2014   cycle of migraine headaches and nausea   Neuromuscular disorder ([place])  hands and throat"spasmodic dysphonia", tremors - thimb and forefinger    Obesity, Class III, BMI 40-49.9 (morbid obesity) ([place]) 04/27/2010   02-17-14 reports some weight loss- intentional   Osteoarthritis    in back(chronic pain)   Pelvic pain    02-17-14 "states thinks its back related on left groin"   Pulmonary emboli ([facility])    s/p Appendectomy '13 [facility]   Reflux    Right knee pain    Sleep apnea    cpap - settings at 4    Toxic goiter     PAST SURGICAL HISTORY: Past Surgical History:  Procedure Laterality Date   ABDOMINAL HYSTERECTOMY     APPENDECTOMY     3'13 [facility] s/p developed  Pulmonary Emboli   AUGMENTATION MAMMAPLASTY  09/17/1968   BOTOX INJECTION     for migranes   CATARACT EXTRACTION, BILATERAL Bilateral 03/2013    CHOLECYSTECTOMY     HAND SURGERY  2004   both hands   KIDNEY STONE SURGERY     KNEE ARTHROSCOPY  1998   right   LAPAROSCOPIC APPENDECTOMY  11/27/2011   Procedure: APPENDECTOMY LAPAROSCOPIC;  Surgeon: Adin Hector, MD;  Location: [facility];  Service: General;  Laterality: N/A;   SPINAL CORD STIMULATOR IMPLANT  06/04/2016   Dr. Alfonse Ras at [facility] Right 12/06/2014   Procedure: RIGHT TOTAL KNEE ARTHROPLASTY;  Surgeon: Gaynelle Arabian, MD;  Location: [facility];  Service: Orthopedics;  Laterality: Right;   TOTAL KNEE REVISION Left 02/24/2014   Procedure: LEFT TOTAL KNEE REVISION;  Surgeon: Gearlean Alf, MD;  Location: [facility];  Service: Orthopedics;  Laterality: Left;   vocal cord surgery  02-17-14   01-12-14 done in [place] [facility](s/p selective  denervation/reinnervation recurrent laryngeal nerve surgery)   zenkers diverticulum      FAMILY HISTORY: Family History  Problem Relation Age of Onset   Heart disease Mother    Pneumonia Father    Cirrhosis Father    Hypertension Father    Alcoholism Father    Heart attack Paternal Grandfather    Heart attack Maternal Grandfather    Heart attack Paternal Grandmother    Heart attack Maternal Grandmother    Breast cancer Daughter     SOCIAL HISTORY:  Social History   Socioeconomic History   Marital status: Widowed    Spouse name: Not on file   Number of children: 5   Years of education: 3   Highest education level: Not on file  Occupational History   Occupation: Retired  Tobacco Use   Smoking status: Former    Packs/day: 0.25    Years: 5.00    Pack years: 1.25    Types: Cigarettes    Quit date: 01/03/1982    Years since quitting: 39.7   Smokeless tobacco: Never  Vaping Use   Vaping Use: Never used  Substance and Sexual Activity   Alcohol use: No   Drug use: No   Sexual activity: Not Currently  Other Topics Concern   Not on file  Social History Narrative   Lives at home alone at this time.   Right-handed.   2 cups caffeine per day.   Social Determinants of Health   Financial Resource Strain: Not on file  Food Insecurity: Not on file  Transportation Needs: Not on file  Physical Activity: Not on file  Stress: Not on file  Social Connections: Not on file  Intimate Partner Violence: Not on file  PHYSICAL EXAM   Vitals:   09/20/21 1533  BP: 106/68  Pulse: 74  Weight: 200 lb (90.7 kg)  Height: 5\' 2"  (1.575 m)   Not recorded     DIAGNOSTIC DATA (LABS, IMAGING, TESTING) - I reviewed patient records, labs, notes, testing and imaging myself where available.   ASSESSMENT AND PLAN  Hayley [name] is a 76 y.o. female   Essential tremor  Gradually worsening bilateral hands tremor, affecting her daily activity,  Continue propanolol XR 120 mg  daily  Chronic migraine headaches  Concurrent with her worsening depression  Overall she is happy with current migraine control,  Zofran, NSAIDs Fioricet as needed as needed.  Nurtec as needed Botox injection for chronic migraine prevention, injection was performed according to [place] protocol,  5 units of Botox was injected into each side, for 31 injection sites, total of 155 units  Bilateral frontalis 4 injection sites Bilateral corrugate 2 injection sites Procerus 1 injection sites. Bilateral temporalis 8 injection sites Bilateral occipitalis 6 injection sites Bilateral cervical paraspinals 4 injection sites Bilateral upper trapezius 6 injection sites  Extra 45 unites were injected into bilateral cervical paraspinal muscles, left parietotemporal region   Marcial Pacas, M.D. Ph.D.  [facility] [address] [place], [place] [address] Ph: [phone] Fax: [phone]  CC: To Dr. Daryll Brod, Flora Lipps  Addendum: Dr. Andres Ege gait evaluation on November 01, 2021, if patient decided for surgical procedure, may direct to see neuropsychology, multidisciplinary evaluation meeting, psychiatric care for better control of anxiety depression

## 2021-09-21 DIAGNOSIS — N3941 Urge incontinence: Secondary | ICD-10-CM | POA: Diagnosis not present

## 2021-09-21 DIAGNOSIS — R350 Frequency of micturition: Secondary | ICD-10-CM | POA: Diagnosis not present

## 2021-09-21 DIAGNOSIS — G43709 Chronic migraine without aura, not intractable, without status migrainosus: Secondary | ICD-10-CM

## 2021-09-21 DIAGNOSIS — E119 Type 2 diabetes mellitus without complications: Secondary | ICD-10-CM | POA: Diagnosis not present

## 2021-09-21 MED ORDER — ONABOTULINUMTOXINA 100 UNITS IJ SOLR
200.0000 [IU] | Freq: Once | INTRAMUSCULAR | Status: AC
  Administered 2021-09-21: 200 [IU] via INTRAMUSCULAR

## 2021-09-22 ENCOUNTER — Encounter (HOSPITAL_COMMUNITY): Payer: Self-pay | Admitting: Psychiatry

## 2021-09-22 ENCOUNTER — Telehealth (HOSPITAL_BASED_OUTPATIENT_CLINIC_OR_DEPARTMENT_OTHER): Payer: Medicare Other | Admitting: Psychiatry

## 2021-09-22 ENCOUNTER — Other Ambulatory Visit: Payer: Self-pay

## 2021-09-22 DIAGNOSIS — F419 Anxiety disorder, unspecified: Secondary | ICD-10-CM | POA: Diagnosis not present

## 2021-09-22 DIAGNOSIS — F322 Major depressive disorder, single episode, severe without psychotic features: Secondary | ICD-10-CM | POA: Diagnosis not present

## 2021-09-22 MED ORDER — LORAZEPAM 0.5 MG PO TABS: 0.5000 mg | ORAL_TABLET | Freq: Every day | ORAL | 1 refills | Status: DC | PRN

## 2021-09-22 MED ORDER — VORTIOXETINE HBR 10 MG PO TABS: 10.0000 mg | ORAL_TABLET | Freq: Every day | ORAL | 1 refills | Status: DC

## 2021-09-22 MED ORDER — TRAZODONE HCL 150 MG PO TABS: 150.0000 mg | ORAL_TABLET | Freq: Every day | ORAL | 1 refills | Status: DC

## 2021-09-22 NOTE — Progress Notes (Signed)
Virtual Visit via Telephone Note  I connected with Hayley [name] on 09/22/21 at 10:20 AM EST by telephone and verified that I am speaking with the correct person using two identifiers.  Location: Patient: Home Provider: Home Office   I discussed the limitations, risks, security and privacy concerns of performing an evaluation and management service by telephone and the availability of in person appointments. I also discussed with the patient that there may be a patient responsible charge related to this service. The patient expressed understanding and agreed to proceed.   History of Present Illness: Patient is evaluated by phone session.  She reported not having trazodone for past few days and not sleeping well.  She still has some time anxiety and ruminative thought but she also trying to do things to herself.  Recently she had fixed her teeth, tried to reach out to the community at Christmas and able to make appointments for the doctors.  Recently she had a visit with a neurologist to receive Botox.  She has chronic tremors.  She also had upcoming appointment and procedure for her bladder stimulator at [facility] in February.  She understand that she needs to do things for herself.  She lived by herself.  She she is happy as had a Christmas dinner at her son-in-law's house.  On the last visit we started Ativan that she can take it for severe anxiety and that helped her a lot.  Her last Ativan was taken a week ago.  However she is out of the refills.  Patient denies any panic attack.  Recently she had a blood work and her hemoglobin A1c is 7.5.  Patient told her provider at [place] has left and she is trying to get appointment with a new provider.     Past Psychiatric History: Reviewed. H/O depression and anxiety.  H/O suicidal attempt on Percocet in March 2019.  Took Effexor in her 45s but stopped after working for a while.  Tried Zoloft, Paxil and Wellbutrin from PCP but did not work. Did  IOP, PHP and given Prozac but no improvement. Tried lexapro caused GI s/e, Lamictal caused increase tremors.   Psychiatric Specialty Exam: Physical Exam  Review of Systems  Weight 200 lb (90.7 kg), last menstrual period 09/18/1979.There is no height or weight on file to calculate BMI.  General Appearance: NA  Eye Contact:  NA  Speech:  Slow  Volume:  Decreased  Mood:  Dysphoric  Affect:  NA  Thought Process:  Descriptions of Associations: Intact  Orientation:  Full (Time, Place, and Person)  Thought Content:  Rumination  Suicidal Thoughts:  No  Homicidal Thoughts:  No  Memory:  Immediate;   Good Recent;   Good Remote;   Fair  Judgement:  Intact  Insight:  Present  Psychomotor Activity:  NA  Concentration:  Concentration: Fair and Attention Span: Fair  Recall:  Good  Fund of Knowledge:  Good  Language:  Fair  Akathisia:  No  Handed:  Right  AIMS (if indicated):     Assets:  Communication Skills Desire for Improvement Housing Transportation  ADL's:  Intact  Cognition:  WNL  Sleep:   fair      Assessment and Plan: Major depressive disorder, recurrent.  Anxiety.  Patient is still have ruminative thoughts but she also trying to get motivated to do things.  Recently she had a dental work and now she is keeping appointment and had a procedure coming up for bladder stimulator.  I recommend if she is running low on her medication and out of the refills then she should call the office immediately.  We will refill trazodone 150 mg at bedtime and continue Trintellix 10 mg daily and Ativan 0.5 mg to take as needed for severe anxiety.  She usually get 15 tablets that last 1 month.  At this time patient is not able to afford therapy.  I recommended to call us back if she is any question or any concern.  I also encouraged she should try to get an earlier appointment with the PCP at [place] and if she has trouble getting appointment then call us back so we can refer to a new  physician.  I discussed her hemoglobin A1c is high.  Patient acknowledged and agree to call us back if needed.  Follow-up in 2 months.  Follow Up Instructions:    I discussed the assessment and treatment plan with the patient. The patient was provided an opportunity to ask questions and all were answered. The patient agreed with the plan and demonstrated an understanding of the instructions.   The patient was advised to call back or seek an in-person evaluation if the symptoms worsen or if the condition fails to improve as anticipated.  I provided 21 minutes of non-face-to-face time during this encounter.   [name], MD

## 2021-09-28 DIAGNOSIS — R350 Frequency of micturition: Secondary | ICD-10-CM | POA: Diagnosis not present

## 2021-09-28 DIAGNOSIS — E119 Type 2 diabetes mellitus without complications: Secondary | ICD-10-CM | POA: Diagnosis not present

## 2021-09-28 DIAGNOSIS — I959 Hypotension, unspecified: Secondary | ICD-10-CM | POA: Diagnosis not present

## 2021-09-28 DIAGNOSIS — N3941 Urge incontinence: Secondary | ICD-10-CM | POA: Diagnosis not present

## 2021-09-28 DIAGNOSIS — R3915 Urgency of urination: Secondary | ICD-10-CM | POA: Diagnosis not present

## 2021-09-29 DIAGNOSIS — E119 Type 2 diabetes mellitus without complications: Secondary | ICD-10-CM | POA: Diagnosis not present

## 2021-09-29 DIAGNOSIS — N3941 Urge incontinence: Secondary | ICD-10-CM | POA: Diagnosis not present

## 2021-09-29 DIAGNOSIS — R350 Frequency of micturition: Secondary | ICD-10-CM | POA: Diagnosis not present

## 2021-10-05 ENCOUNTER — Telehealth: Payer: Self-pay | Admitting: Neurology

## 2021-10-05 NOTE — Telephone Encounter (Signed)
I called the patient back. She had a neurostimulation device placed for her bladder on 09/28/21. Says she had some monitoring equipment taped to her left thumb. Since discharge, reports experiencing numbness in that digit. She is going to speak more to the surgeon at her post-op visit on 10/06/21. She wanted it to be noted here, in case the symptoms does not resolve.

## 2021-10-05 NOTE — Telephone Encounter (Signed)
Pt is having numbness in thumb in left hand  which is where an apparatus was for a surgery that pt had.  Pt has a post opp on tomorrow but wanted to discuss this with Sharyn Lull, [place]

## 2021-10-06 DIAGNOSIS — Z09 Encounter for follow-up examination after completed treatment for conditions other than malignant neoplasm: Secondary | ICD-10-CM | POA: Diagnosis not present

## 2021-10-06 DIAGNOSIS — R82998 Other abnormal findings in urine: Secondary | ICD-10-CM | POA: Diagnosis not present

## 2021-10-06 DIAGNOSIS — R5381 Other malaise: Secondary | ICD-10-CM | POA: Diagnosis not present

## 2021-10-06 DIAGNOSIS — R531 Weakness: Secondary | ICD-10-CM | POA: Diagnosis not present

## 2021-10-11 ENCOUNTER — Other Ambulatory Visit (HOSPITAL_COMMUNITY): Payer: Self-pay | Admitting: Psychiatry

## 2021-10-11 DIAGNOSIS — F419 Anxiety disorder, unspecified: Secondary | ICD-10-CM

## 2021-10-11 DIAGNOSIS — F322 Major depressive disorder, single episode, severe without psychotic features: Secondary | ICD-10-CM

## 2021-10-12 DIAGNOSIS — N3941 Urge incontinence: Secondary | ICD-10-CM | POA: Diagnosis not present

## 2021-10-12 DIAGNOSIS — R5381 Other malaise: Secondary | ICD-10-CM | POA: Diagnosis not present

## 2021-10-12 DIAGNOSIS — R918 Other nonspecific abnormal finding of lung field: Secondary | ICD-10-CM | POA: Diagnosis not present

## 2021-10-12 DIAGNOSIS — J90 Pleural effusion, not elsewhere classified: Secondary | ICD-10-CM | POA: Diagnosis not present

## 2021-10-12 DIAGNOSIS — E039 Hypothyroidism, unspecified: Secondary | ICD-10-CM | POA: Diagnosis not present

## 2021-10-12 DIAGNOSIS — R200 Anesthesia of skin: Secondary | ICD-10-CM | POA: Diagnosis not present

## 2021-10-12 DIAGNOSIS — R531 Weakness: Secondary | ICD-10-CM | POA: Diagnosis not present

## 2021-10-12 DIAGNOSIS — R3915 Urgency of urination: Secondary | ICD-10-CM | POA: Diagnosis not present

## 2021-10-12 DIAGNOSIS — R350 Frequency of micturition: Secondary | ICD-10-CM | POA: Diagnosis not present

## 2021-10-13 DIAGNOSIS — R200 Anesthesia of skin: Secondary | ICD-10-CM | POA: Diagnosis not present

## 2021-10-13 DIAGNOSIS — N3941 Urge incontinence: Secondary | ICD-10-CM | POA: Diagnosis not present

## 2021-10-13 DIAGNOSIS — R531 Weakness: Secondary | ICD-10-CM | POA: Diagnosis not present

## 2021-10-13 DIAGNOSIS — R5381 Other malaise: Secondary | ICD-10-CM | POA: Diagnosis not present

## 2021-10-13 DIAGNOSIS — R350 Frequency of micturition: Secondary | ICD-10-CM | POA: Diagnosis not present

## 2021-10-15 ENCOUNTER — Other Ambulatory Visit (HOSPITAL_COMMUNITY): Payer: Self-pay | Admitting: Psychiatry

## 2021-10-15 DIAGNOSIS — F322 Major depressive disorder, single episode, severe without psychotic features: Secondary | ICD-10-CM

## 2021-10-18 ENCOUNTER — Other Ambulatory Visit: Payer: Self-pay | Admitting: *Deleted

## 2021-10-18 ENCOUNTER — Telehealth: Payer: Self-pay | Admitting: Neurology

## 2021-10-18 DIAGNOSIS — G250 Essential tremor: Secondary | ICD-10-CM

## 2021-10-18 DIAGNOSIS — G43709 Chronic migraine without aura, not intractable, without status migrainosus: Secondary | ICD-10-CM

## 2021-10-18 NOTE — Telephone Encounter (Signed)
New referral placed in Epic today.

## 2021-10-18 NOTE — Telephone Encounter (Signed)
Pt states due to illness the initial referral to  Dr Linus Mako has expired.   Pt is asking that another be sent so that she can make an appointment with him as soon as possible.

## 2021-10-19 NOTE — Telephone Encounter (Signed)
Noted, sent the referral via [facility] portal ph # [phone]

## 2021-10-26 ENCOUNTER — Other Ambulatory Visit: Payer: Self-pay | Admitting: Family Medicine

## 2021-10-26 DIAGNOSIS — E039 Hypothyroidism, unspecified: Secondary | ICD-10-CM | POA: Diagnosis not present

## 2021-10-26 DIAGNOSIS — F331 Major depressive disorder, recurrent, moderate: Secondary | ICD-10-CM | POA: Diagnosis not present

## 2021-10-26 DIAGNOSIS — I517 Cardiomegaly: Secondary | ICD-10-CM | POA: Diagnosis not present

## 2021-10-26 DIAGNOSIS — I519 Heart disease, unspecified: Secondary | ICD-10-CM | POA: Diagnosis not present

## 2021-10-26 DIAGNOSIS — I700 Atherosclerosis of aorta: Secondary | ICD-10-CM | POA: Diagnosis not present

## 2021-10-26 DIAGNOSIS — Z1159 Encounter for screening for other viral diseases: Secondary | ICD-10-CM | POA: Diagnosis not present

## 2021-10-26 DIAGNOSIS — Z0000 Encounter for general adult medical examination without abnormal findings: Secondary | ICD-10-CM | POA: Diagnosis not present

## 2021-10-26 DIAGNOSIS — E2839 Other primary ovarian failure: Secondary | ICD-10-CM

## 2021-10-26 DIAGNOSIS — J449 Chronic obstructive pulmonary disease, unspecified: Secondary | ICD-10-CM | POA: Diagnosis not present

## 2021-10-26 DIAGNOSIS — I2510 Atherosclerotic heart disease of native coronary artery without angina pectoris: Secondary | ICD-10-CM | POA: Diagnosis not present

## 2021-10-26 DIAGNOSIS — Z79899 Other long term (current) drug therapy: Secondary | ICD-10-CM | POA: Diagnosis not present

## 2021-10-26 DIAGNOSIS — E1169 Type 2 diabetes mellitus with other specified complication: Secondary | ICD-10-CM | POA: Diagnosis not present

## 2021-10-26 DIAGNOSIS — H35033 Hypertensive retinopathy, bilateral: Secondary | ICD-10-CM | POA: Diagnosis not present

## 2021-10-26 DIAGNOSIS — E538 Deficiency of other specified B group vitamins: Secondary | ICD-10-CM | POA: Diagnosis not present

## 2021-10-27 DIAGNOSIS — N819 Female genital prolapse, unspecified: Secondary | ICD-10-CM | POA: Diagnosis not present

## 2021-10-27 DIAGNOSIS — R350 Frequency of micturition: Secondary | ICD-10-CM | POA: Diagnosis not present

## 2021-10-27 DIAGNOSIS — N393 Stress incontinence (female) (male): Secondary | ICD-10-CM | POA: Diagnosis not present

## 2021-10-27 DIAGNOSIS — Z9682 Presence of neurostimulator: Secondary | ICD-10-CM | POA: Diagnosis not present

## 2021-10-30 DIAGNOSIS — Z1211 Encounter for screening for malignant neoplasm of colon: Secondary | ICD-10-CM | POA: Diagnosis not present

## 2021-11-01 DIAGNOSIS — G250 Essential tremor: Secondary | ICD-10-CM | POA: Diagnosis not present

## 2021-11-01 DIAGNOSIS — J383 Other diseases of vocal cords: Secondary | ICD-10-CM | POA: Diagnosis not present

## 2021-11-01 DIAGNOSIS — Z9682 Presence of neurostimulator: Secondary | ICD-10-CM | POA: Diagnosis not present

## 2021-11-22 ENCOUNTER — Other Ambulatory Visit (HOSPITAL_COMMUNITY): Payer: Self-pay | Admitting: Psychiatry

## 2021-11-22 DIAGNOSIS — F419 Anxiety disorder, unspecified: Secondary | ICD-10-CM

## 2021-11-22 DIAGNOSIS — F322 Major depressive disorder, single episode, severe without psychotic features: Secondary | ICD-10-CM

## 2021-11-28 ENCOUNTER — Other Ambulatory Visit: Payer: Self-pay | Admitting: Gastroenterology

## 2021-11-28 DIAGNOSIS — E669 Obesity, unspecified: Secondary | ICD-10-CM | POA: Diagnosis not present

## 2021-11-28 DIAGNOSIS — K5730 Diverticulosis of large intestine without perforation or abscess without bleeding: Secondary | ICD-10-CM | POA: Diagnosis not present

## 2021-11-28 DIAGNOSIS — Z1211 Encounter for screening for malignant neoplasm of colon: Secondary | ICD-10-CM | POA: Diagnosis not present

## 2021-11-28 DIAGNOSIS — K5904 Chronic idiopathic constipation: Secondary | ICD-10-CM | POA: Diagnosis not present

## 2021-11-28 DIAGNOSIS — Z86010 Personal history of colonic polyps: Secondary | ICD-10-CM | POA: Diagnosis not present

## 2021-11-28 DIAGNOSIS — R140 Abdominal distension (gaseous): Secondary | ICD-10-CM | POA: Diagnosis not present

## 2021-11-29 ENCOUNTER — Telehealth (HOSPITAL_COMMUNITY): Payer: Medicare Other | Admitting: Psychiatry

## 2021-12-01 DIAGNOSIS — M5416 Radiculopathy, lumbar region: Secondary | ICD-10-CM | POA: Diagnosis not present

## 2021-12-04 ENCOUNTER — Encounter (HOSPITAL_COMMUNITY): Payer: Self-pay | Admitting: Psychiatry

## 2021-12-04 ENCOUNTER — Telehealth (HOSPITAL_BASED_OUTPATIENT_CLINIC_OR_DEPARTMENT_OTHER): Payer: Medicare Other | Admitting: Psychiatry

## 2021-12-04 ENCOUNTER — Other Ambulatory Visit: Payer: Self-pay

## 2021-12-04 DIAGNOSIS — F322 Major depressive disorder, single episode, severe without psychotic features: Secondary | ICD-10-CM | POA: Diagnosis not present

## 2021-12-04 DIAGNOSIS — F419 Anxiety disorder, unspecified: Secondary | ICD-10-CM | POA: Diagnosis not present

## 2021-12-04 MED ORDER — VORTIOXETINE HBR 10 MG PO TABS: 10.0000 mg | ORAL_TABLET | Freq: Every day | ORAL | 0 refills | Status: DC

## 2021-12-04 MED ORDER — TRAZODONE HCL 150 MG PO TABS: 150.0000 mg | ORAL_TABLET | Freq: Every day | ORAL | 0 refills | Status: DC

## 2021-12-04 MED ORDER — LORAZEPAM 0.5 MG PO TABS: 0.5000 mg | ORAL_TABLET | Freq: Every day | ORAL | 1 refills | Status: DC | PRN

## 2021-12-04 NOTE — Progress Notes (Signed)
Virtual Visit via Telephone Note ? ?I connected with Hayley [name] on 12/04/21 at  3:00 PM EDT by telephone and verified that I am speaking with the correct person using two identifiers. ? ?Location: ?Patient: Home ?Provider: Home Office ?  ?I discussed the limitations, risks, security and privacy concerns of performing an evaluation and management service by telephone and the availability of in person appointments. I also discussed with the patient that there may be a patient responsible charge related to this service. The patient expressed understanding and agreed to proceed. ? ? ?History of Present Illness: ?Patient is evaluated by phone session.  She reported her depression and anxiety is chronic but stable.  She does get episodes of severe anxiety but able to come out of it.  She understands biggest concern is finances and her general health condition.  She is keeping the appointment and she has upcoming appointment with a neurosurgery to discuss about her neuro stimulator placed and bladder.  Sometimes she has numbness in her left thumb.  She also saw neurology at [name] but no new medication added.  She started walking every day when the weather is good and also taking quilt classes which she enjoys.  She is sleeping 5 hours and usually in the beginning she has struggled but when she go to sleep she is sleep better.  She denies any suicidal thoughts, hallucination, paranoia.  Her appetite is okay and her weight is stable.  She has chronic tremors and given the diagnosis of essential tremors.  She is taking Ativan as needed.  She liked the Trintellix and trazodone.  She does not want to change the medication. ? ? ?Past Psychiatric History: Reviewed. ?H/O depression and anxiety.  H/O suicidal attempt on Percocet in March 2019.  Took Effexor in her 85s but stopped after working for a while.  Tried Zoloft, Paxil and Wellbutrin from PCP but did not work. Did IOP, PHP and given Prozac but no improvement. Tried  lexapro caused GI s/e, Lamictal caused increase tremors.  ? ?Psychiatric Specialty Exam: ?Physical Exam  ?Review of Systems  ?Weight 195 lb (88.5 kg), last menstrual period 09/18/1979.There is no height or weight on file to calculate BMI.  ?General Appearance: NA  ?Eye Contact:  Fair  ?Speech:  Slow  ?Volume:  Decreased  ?Mood:  Anxious  ?Affect:  NA  ?Thought Process:  Descriptions of Associations: Intact  ?Orientation:  Full (Time, Place, and Person)  ?Thought Content:  Rumination  ?Suicidal Thoughts:  No  ?Homicidal Thoughts:  No  ?Memory:  Immediate;   Good ?Recent;   Fair ?Remote;   Fair  ?Judgement:  Intact  ?Insight:  Present  ?Psychomotor Activity:  NA  ?Concentration:  Concentration: Fair and Attention Span: Fair  ?Recall:  Fair  ?Fund of Knowledge:  Fair  ?Language:  Good  ?Akathisia:  No  ?Handed:  Right  ?AIMS (if indicated):     ?Assets:  Communication Skills ?Desire for Improvement ?Housing ?Resilience  ?ADL's:  Intact  ?Cognition:  WNL  ?Sleep:   4-5 hrs  ? ? ? ? ?Assessment and Plan: ?Major depressive disorder, recurrent.  Anxiety. ? ?Patient is stable but she has chronic symptoms.  She like to keep on current medication.  Discussed medication side effects and benefits.  She is keeping appointment about her general health needs.  She like to keep appointment with [place] in 2 months.  I will send her prescription Trintellix 10 mg to take daily and trazodone 150 mg  at bedtime to [facility].  We will continue Ativan 0.5 mg to take as needed to her local pharmacy.  We will follow up in 8 weeks. ? ?Follow Up Instructions: ? ?  ?I discussed the assessment and treatment plan with the patient. The patient was provided an opportunity to ask questions and all were answered. The patient agreed with the plan and demonstrated an understanding of the instructions. ?  ?The patient was advised to call back or seek an in-person evaluation if the symptoms worsen or if the condition fails to improve as  anticipated. ? ?I provided 20 minutes of non-face-to-face time during this encounter. ? ? ?[name], MD  ?

## 2021-12-05 ENCOUNTER — Other Ambulatory Visit (HOSPITAL_COMMUNITY): Payer: Self-pay | Admitting: *Deleted

## 2021-12-05 MED ORDER — VORTIOXETINE HBR 10 MG PO TABS: 10.0000 mg | ORAL_TABLET | Freq: Every day | ORAL | 0 refills | Status: DC

## 2021-12-06 DIAGNOSIS — G250 Essential tremor: Secondary | ICD-10-CM | POA: Diagnosis not present

## 2021-12-06 DIAGNOSIS — Z7901 Long term (current) use of anticoagulants: Secondary | ICD-10-CM | POA: Diagnosis not present

## 2021-12-06 DIAGNOSIS — Z86718 Personal history of other venous thrombosis and embolism: Secondary | ICD-10-CM | POA: Diagnosis not present

## 2021-12-06 DIAGNOSIS — Z20822 Contact with and (suspected) exposure to covid-19: Secondary | ICD-10-CM | POA: Diagnosis not present

## 2021-12-14 ENCOUNTER — Encounter (HOSPITAL_COMMUNITY): Payer: Self-pay | Admitting: Gastroenterology

## 2021-12-15 ENCOUNTER — Other Ambulatory Visit: Payer: Self-pay

## 2021-12-15 ENCOUNTER — Ambulatory Visit (HOSPITAL_COMMUNITY)
Admission: RE | Admit: 2021-12-15 | Discharge: 2021-12-15 | Disposition: A | Discharge status: Home / Self Care | Payer: Medicare Other | Attending: Gastroenterology | Admitting: Gastroenterology

## 2021-12-15 ENCOUNTER — Encounter (HOSPITAL_COMMUNITY): Payer: Self-pay | Admitting: Gastroenterology

## 2021-12-15 ENCOUNTER — Ambulatory Visit (HOSPITAL_COMMUNITY): Payer: Medicare Other | Admitting: Anesthesiology

## 2021-12-15 ENCOUNTER — Ambulatory Visit (HOSPITAL_BASED_OUTPATIENT_CLINIC_OR_DEPARTMENT_OTHER): Payer: Medicare Other | Admitting: Anesthesiology

## 2021-12-15 ENCOUNTER — Encounter (HOSPITAL_COMMUNITY)
Admission: RE | Disposition: A | Discharge status: Home / Self Care | Payer: Self-pay | Source: Home / Self Care | Attending: Gastroenterology

## 2021-12-15 DIAGNOSIS — G709 Myoneural disorder, unspecified: Secondary | ICD-10-CM | POA: Insufficient documentation

## 2021-12-15 DIAGNOSIS — G4730 Sleep apnea, unspecified: Secondary | ICD-10-CM | POA: Diagnosis not present

## 2021-12-15 DIAGNOSIS — F419 Anxiety disorder, unspecified: Secondary | ICD-10-CM | POA: Insufficient documentation

## 2021-12-15 DIAGNOSIS — I10 Essential (primary) hypertension: Secondary | ICD-10-CM

## 2021-12-15 DIAGNOSIS — Z9682 Presence of neurostimulator: Secondary | ICD-10-CM | POA: Insufficient documentation

## 2021-12-15 DIAGNOSIS — K219 Gastro-esophageal reflux disease without esophagitis: Secondary | ICD-10-CM | POA: Diagnosis not present

## 2021-12-15 DIAGNOSIS — K5730 Diverticulosis of large intestine without perforation or abscess without bleeding: Secondary | ICD-10-CM | POA: Insufficient documentation

## 2021-12-15 DIAGNOSIS — D122 Benign neoplasm of ascending colon: Secondary | ICD-10-CM | POA: Insufficient documentation

## 2021-12-15 DIAGNOSIS — D124 Benign neoplasm of descending colon: Secondary | ICD-10-CM | POA: Insufficient documentation

## 2021-12-15 DIAGNOSIS — R195 Other fecal abnormalities: Secondary | ICD-10-CM | POA: Insufficient documentation

## 2021-12-15 DIAGNOSIS — E039 Hypothyroidism, unspecified: Secondary | ICD-10-CM

## 2021-12-15 DIAGNOSIS — Z1211 Encounter for screening for malignant neoplasm of colon: Secondary | ICD-10-CM | POA: Diagnosis not present

## 2021-12-15 DIAGNOSIS — Z9981 Dependence on supplemental oxygen: Secondary | ICD-10-CM | POA: Insufficient documentation

## 2021-12-15 DIAGNOSIS — E119 Type 2 diabetes mellitus without complications: Secondary | ICD-10-CM | POA: Insufficient documentation

## 2021-12-15 DIAGNOSIS — K635 Polyp of colon: Secondary | ICD-10-CM | POA: Diagnosis not present

## 2021-12-15 DIAGNOSIS — D123 Benign neoplasm of transverse colon: Secondary | ICD-10-CM | POA: Diagnosis not present

## 2021-12-15 DIAGNOSIS — Z87891 Personal history of nicotine dependence: Secondary | ICD-10-CM | POA: Diagnosis not present

## 2021-12-15 DIAGNOSIS — K5790 Diverticulosis of intestine, part unspecified, without perforation or abscess without bleeding: Secondary | ICD-10-CM | POA: Diagnosis not present

## 2021-12-15 HISTORY — PX: COLONOSCOPY WITH PROPOFOL: SHX5780

## 2021-12-15 HISTORY — PX: HEMOSTASIS CLIP PLACEMENT: SHX6857

## 2021-12-15 HISTORY — PX: POLYPECTOMY: SHX5525

## 2021-12-15 LAB — GLUCOSE, CAPILLARY: Glucose-Capillary: 121 mg/dL — ABNORMAL HIGH (ref 70–99)

## 2021-12-15 SURGERY — COLONOSCOPY WITH PROPOFOL
Anesthesia: Monitor Anesthesia Care

## 2021-12-15 MED ORDER — PHENYLEPHRINE 40 MCG/ML (10ML) SYRINGE FOR IV PUSH (FOR BLOOD PRESSURE SUPPORT)
PREFILLED_SYRINGE | INTRAVENOUS | Status: DC | PRN
Start: 2021-12-15 — End: 2021-12-15
  Administered 2021-12-15: 80 ug via INTRAVENOUS

## 2021-12-15 MED ORDER — PROPOFOL 10 MG/ML IV BOLUS
INTRAVENOUS | Status: DC | PRN
  Administered 2021-12-15: 40 mg via INTRAVENOUS
  Administered 2021-12-15: 20 mg via INTRAVENOUS

## 2021-12-15 MED ORDER — LIDOCAINE 2% (20 MG/ML) 5 ML SYRINGE
INTRAMUSCULAR | Status: DC | PRN
  Administered 2021-12-15: 40 mg via INTRAVENOUS

## 2021-12-15 MED ORDER — PROPOFOL 500 MG/50ML IV EMUL
INTRAVENOUS | Status: DC | PRN
  Administered 2021-12-15: 125 ug/kg/min via INTRAVENOUS

## 2021-12-15 MED ORDER — SODIUM CHLORIDE 0.9 % IV SOLN: INTRAVENOUS | Status: DC

## 2021-12-15 MED ORDER — LACTATED RINGERS IV SOLN
INTRAVENOUS | Status: AC | PRN
Start: 2021-12-15 — End: 2021-12-15
  Administered 2021-12-15: 1000 mL via INTRAVENOUS

## 2021-12-15 SURGICAL SUPPLY — 22 items

## 2021-12-15 NOTE — Anesthesia Postprocedure Evaluation (Signed)
Anesthesia Post Note ? ?Patient: ZAKERIA KULZER ? ?Procedure(s) Performed: COLONOSCOPY WITH PROPOFOL ? ?  ? ?Patient location during evaluation: Endoscopy ?Anesthesia Type: MAC ?Level of consciousness: awake and alert ?Pain management: pain level controlled ?Vital Signs Assessment: post-procedure vital signs reviewed and stable ?Respiratory status: spontaneous breathing, nonlabored ventilation and respiratory function stable ?Cardiovascular status: stable and blood pressure returned to baseline ?Postop Assessment: no apparent nausea or vomiting ?Anesthetic complications: no ? ? ?No notable events documented. ? ?Last Vitals:  ?Vitals:  ? 12/15/21 1150 12/15/21 1200  ?BP: (!) 86/49 (!) 104/48  ?Pulse: (!) 56   ?Resp: 18   ?Temp:    ?SpO2: 90%   ?  ?Last Pain:  ?Vitals:  ? 12/15/21 1200  ?TempSrc:   ?PainSc: 0-No pain  ? ? ?  ?  ?  ?  ?  ?  ? ?Seibert Keeter ? ? ? ? ?

## 2021-12-15 NOTE — Discharge Instructions (Signed)

## 2021-12-15 NOTE — Transfer of Care (Signed)
Immediate Anesthesia Transfer of Care Note ? ?Patient: Hayley [name] ? ?Procedure(s) Performed: COLONOSCOPY WITH PROPOFOL ? ?Patient Location: PACU and Endoscopy Unit ? ?Anesthesia Type:MAC ? ?Level of Consciousness: awake, alert  and oriented ? ?Airway & Oxygen Therapy: Patient Spontanous Breathing ? ?Post-op Assessment: Report given to RN and Post -op Vital signs reviewed and stable ? ?Post vital signs: Reviewed and stable ? ?Last Vitals:  ?Vitals Value Taken Time  ?BP    ?Temp    ?Pulse 61 12/15/21 1140  ?Resp 17 12/15/21 1140  ?SpO2 91 % 12/15/21 1140  ?Vitals shown include unvalidated device data. ? ?Last Pain:  ?Vitals:  ? 12/15/21 1017  ?TempSrc: Oral  ?PainSc: 0-No pain  ?   ? ?  ? ?Complications: No notable events documented. ?

## 2021-12-15 NOTE — H&P (Signed)
Hayley [name] ?HPI: This 76 year old white female presents to the office for further evaluation of guaiac positive stool cards done in February by Dr. Jonathon [name]. She usually has constipation alternating with diarrhea.  When she is constipated she can go 4 days without having a BM.  When she has diarrhea, she can have up to 6 BM's per day; this is usually when she is stressed. She has Hyoscyamine on hand at home to take as needed. She usually has 1 BM every 3 days with occasional mucoid stool. She has traces of bright red blood on the toilet tissue and in the stool. She had a  Interstim-neurostimulator inserted in mid-January, this year, to help control her fecal and bladder incontinence; that is working well for her. She has a lot of gas, bloating and nausea. She takes Ondansetron or Promethazine as needed for the nausea. She has some difficulty swallowing. She has a good appetite. She reports she has lost 50 pounds over the past 3 years. She denies having any complaints of abdominal pain, vomiting, acid reflux, or odynophagia. She denies having a family history of colon cancer, celiac sprue or IBD. Her last colonoscopy done on 04/10/2010 revealed internal hemorrhoids, pandiverticulosis and a tubular adenoma was removed from the hepatic flexure. She is presently being treated for a UTI. ? ?Past Medical History:  ?Diagnosis Date  ? Anemia   ? Anxiety   ? Bronchitis   ? hx of   ? Clotting disorder ([place]) 02-17-14  ? S/p appendectomy-developed Pulmonary emboli-tx. warfarin-d/c 1 yr ago.  ? Colon polyps   ? Depression   ? Diabetes mellitus without complication ([place])   ? "borderline"- oral med  ? 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 Hyperthyroidism   ? Kidney stones   ? Migraines   ? Nausea Nov 15, 2014  ? cycle of migraine headaches and nausea  ? Neuromuscular disorder ([place])   ? hands and throat"spasmodic dysphonia", tremors - thimb and forefinger   ? Obesity, Class  III, BMI 40-49.9 (morbid obesity) ([place]) 04/27/2010  ? 02-17-14 reports some weight loss- intentional  ? Osteoarthritis   ? in back(chronic pain)  ? Pelvic pain   ? 02-17-14 "states thinks its back related on left groin"  ? Pulmonary emboli ([place])   ? s/p Appendectomy '13 -[facility]  ? Reflux   ? Right knee pain   ? Sleep apnea   ? cpap - settings at 4   ? Toxic goiter   ? ? ?Past Surgical History:  ?Procedure Laterality Date  ? ABDOMINAL HYSTERECTOMY    ? APPENDECTOMY    ? 6'[facility] s/p developed  Pulmonary Emboli  ? AUGMENTATION MAMMAPLASTY  09/17/1968  ? BOTOX INJECTION    ? for migranes  ? CATARACT EXTRACTION, BILATERAL Bilateral 03/2013   ? CHOLECYSTECTOMY    ? HAND SURGERY  2004  ? both hands  ? KIDNEY STONE SURGERY    ? KNEE ARTHROSCOPY  1998  ? right  ? LAPAROSCOPIC APPENDECTOMY  11/27/2011  ? Procedure: APPENDECTOMY LAPAROSCOPIC;  Surgeon: Hayley Hector, MD;  Location: [facility]S;  Service: General;  Laterality: N/A;  ? SPINAL CORD STIMULATOR IMPLANT  06/04/2016  ? Hayley [name] at [place]  ? TOTAL KNEE ARTHROPLASTY Right 12/06/2014  ? Procedure: RIGHT TOTAL KNEE ARTHROPLASTY;  Surgeon: Hayley Arabian, MD;  Location: [facility];  Service: Orthopedics;  Laterality: Right;  ? TOTAL KNEE REVISION Left  02/24/2014  ? Procedure: LEFT TOTAL KNEE REVISION;  Surgeon: Hayley Alf, MD;  Location: [facility];  Service: Orthopedics;  Laterality: Left;  ? vocal cord surgery  02-17-14  ? 01-12-14 done in [place] -[facility](s/p selective denervation/reinnervation recurrent laryngeal nerve surgery)  ? zenkers diverticulum    ? ? ?Family History  ?Problem Relation Age of Onset  ? Heart disease Mother   ? Pneumonia Father   ? Cirrhosis Father   ? Hypertension Father   ? Alcoholism Father   ? Heart attack Paternal Grandfather   ? Heart attack Maternal Grandfather   ? Heart attack Paternal Grandmother   ? Heart attack Maternal Grandmother   ? Breast cancer Daughter   ? ? ?Social History:  reports that she quit smoking about 39 years ago.  Her smoking use included cigarettes. She has a 1.25 pack-year smoking history. She has never used smokeless tobacco. She reports that she does not drink alcohol and does not use drugs. ? ?Allergies:  ?Allergies  ?Allergen Reactions  ? Bee Venom Anaphylaxis  ? Morphine And Related   ? Tape Rash  ?  Rash - only use paper tape  ? Etodolac Diarrhea and Nausea And Vomiting  ? Morphine Other (See Comments)  ?  Hallucinations  ? Other Itching and Swelling  ?  Bee venom  ? Poison Ivy Extract  [Poison Ivy Extract] Itching and Swelling  ? Propoxyphene Nausea And Vomiting  ? Propoxyphene N-Acetaminophen Nausea And Vomiting  ? Penicillins Rash  ?  Has patient had a PCN reaction causing immediate rash, facial/tongue/throat swelling, SOB or lightheadedness with hypotension: Yes ?Has patient had a PCN reaction causing severe rash involving mucus membranes or skin necrosis: No ?Has patient had a PCN reaction that required hospitalization No ?Has patient had a PCN reaction occurring within the last 10 years: No ?If all of the above answers are "NO", then may proceed with Cephalosporin use. ?  ? ? ?Medications: Scheduled: ?Continuous: ? ?No results found for this or any previous visit (from the past 24 hour(s)).  ? ?No results found. ? ?ROS:  As stated above in the HPI otherwise negative. ? ?Last menstrual period 09/18/1979.   ? ?PE: ?Gen: NAD, Alert and Oriented ?HEENT:  [place]/AT, EOMI ?Neck: Supple, no LAD ?Lungs: CTA Bilaterally ?CV: RRR without M/G/R ?ABD: Soft, NTND, +BS ?Ext: No C/C/E ? ?Assessment/Plan: ?1) Heme positive stool - colonoscopy. ? ?Hayley [name] D ?12/15/2021, 7:49 AM  ? ? ?  ? ?

## 2021-12-15 NOTE — Anesthesia Preprocedure Evaluation (Signed)
Anesthesia Evaluation  ?Patient identified by MRN, date of birth, ID band ?Patient awake ? ? ? ?Reviewed: ?Allergy & Precautions, NPO status , Patient's Chart, lab work & pertinent test results ? ?History of Anesthesia Complications ?Negative for: history of anesthetic complications ? ?Airway ?Mallampati: I ? ?TM Distance: >3 FB ?Neck ROM: Full ? ? ? Dental ? ?(+) Teeth Intact, Dental Advisory Given,  ?  ?Pulmonary ?neg shortness of breath, asthma , sleep apnea and Oxygen sleep apnea , neg COPD, neg recent URI, former smoker,  ?2L at night ?  ?breath sounds clear to auscultation ? ? ? ? ? ? Cardiovascular ?hypertension, (-) angina(-) Past MI  ?Rhythm:Regular  ?1. The left ventricle has normal systolic function, with an ejection  ?fraction of 55-60%. The cavity size was normal. Left ventricular diastolic  ?Doppler parameters are consistent with impaired relaxation.  ??2. The right ventricle has normal systolic function. The cavity was  ?normal. There is no increase in right ventricular wall thickness.  ??3. Left atrial size was mildly dilated.  ??4. Right atrial size was mildly dilated.  ??5. No stenosis of the aortic valve.  ??6. The aortic root and ascending aorta are normal in size and structure.  ??7. The interatrial septum was not assessed.  ?  ?Neuro/Psych ? Headaches, PSYCHIATRIC DISORDERS Anxiety B/l nerve stimulators ? Neuromuscular disease   ? GI/Hepatic ?GERD  ,  ?Endo/Other  ?diabetesHypothyroidism Lab Results ?     Component                Value               Date                 ?     HGBA1C                   6.4 (H)             04/02/2019           ? ? ? Renal/GU ?Renal diseaseLab Results ?     Component                Value               Date                 ?     CREATININE               0.71                04/04/2019           ?  ? ?  ?Musculoskeletal ? ?(+) Arthritis ,  ? Abdominal ?  ?Peds ? Hematology ?negative hematology ROS ?(+)   ?Anesthesia Other Findings ? ?  Reproductive/Obstetrics ? ?  ? ? ? ? ? ? ? ? ? ? ? ? ? ?  ?  ? ? ? ? ? ? ? ? ?Anesthesia Physical ?Anesthesia Plan ? ?ASA: 3 ? ?Anesthesia Plan:   ? ?Post-op Pain Management: Minimal or no pain anticipated  ? ?Induction: Intravenous ? ?PONV Risk Score and Plan: 2 and Propofol infusion and Treatment may vary due to age or medical condition ? ?Airway Management Planned: Nasal Cannula, Natural Airway and Simple Face Mask ? ?Additional Equipment: None ? ?Intra-op Plan:  ? ?Post-operative Plan:  ? ?Informed Consent: I have reviewed the patients History and Physical, chart, labs and discussed the procedure including the  risks, benefits and alternatives for the proposed anesthesia with the patient or authorized representative who has indicated his/her understanding and acceptance.  ? ? ? ?Dental advisory given ? ?Plan Discussed with: Anesthesiologist and CRNA ? ?Anesthesia Plan Comments:   ? ? ? ? ? ? ?Anesthesia Quick Evaluation ? ?

## 2021-12-15 NOTE — Op Note (Signed)
[facility] ?Patient Name: Hayley [name] ?Procedure Date: 12/15/2021 ?MRN: [id number] ?Attending MD: Carol Ada , MD ?Date of Birth: February 25, 1946 ?CSN: [id number] ?Age: 76 ?Admit Type: Emergency Department ?Procedure:                Colonoscopy ?Indications:              Heme positive stool ?Providers:                Carol Ada, MD, Cathlean Marseilles, Justina  ?                          Purcell Nails, Merchant [employer], [employer] ?Referring MD:              ?Medicines:                 ?Complications:            No immediate complications. ?Estimated Blood Loss:     Estimated blood loss: none. ?Procedure:                Pre-Anesthesia Assessment: ?                          - Prior to the procedure, a History and Physical  ?                          was performed, and patient medications and  ?                          allergies were reviewed. The patient's tolerance of  ?                          previous anesthesia was also reviewed. The risks  ?                          and benefits of the procedure and the sedation  ?                          options and risks were discussed with the patient.  ?                          All questions were answered, and informed consent  ?                          was obtained. Prior Anticoagulants: The patient has  ?                          taken no previous anticoagulant or antiplatelet  ?                          agents. ASA Grade Assessment: III - A patient with  ?                          severe systemic disease. After reviewing the risks  ?                          and benefits,  the patient was deemed in  ?                          satisfactory condition to undergo the procedure. ?                          - Sedation was administered by an anesthesia  ?                          professional. Deep sedation was attained. ?                          After obtaining informed consent, the colonoscope  ?                          was passed under direct vision. Throughout  the  ?                          procedure, the patient's blood pressure, pulse, and  ?                          oxygen saturations were monitored continuously. The  ?                          [id number]-HQ190L ([id number]) Olympus colonoscope was  ?                          introduced through the anus and advanced to the the  ?                          cecum, identified by appendiceal orifice and  ?                          ileocecal valve. The colonoscopy was performed  ?                          without difficulty. The patient tolerated the  ?                          procedure well. The quality of the bowel  ?                          preparation was evaluated using the BBPS [facility]  ?                          Bowel Preparation Scale) with scores of: Right  ?                          Colon = 2 (minor amount of residual staining, small  ?                          fragments of stool and/or opaque liquid, but mucosa  ?  seen well), Transverse Colon = 2 (minor amount of  ?                          residual staining, small fragments of stool and/or  ?                          opaque liquid, but mucosa seen well) and Left Colon  ?                          = 2 (minor amount of residual staining, small  ?                          fragments of stool and/or opaque liquid, but mucosa  ?                          seen well). The total BBPS score equals 6. The  ?                          quality of the bowel preparation was good. The  ?                          ileocecal valve, appendiceal orifice, and rectum  ?                          were photographed. ?Scope In: 11:02:52 AM ?Scope Out: 11:32:04 AM ?Scope Withdrawal Time: 0 hours 17 minutes 10 seconds  ?Total Procedure Duration: 0 hours 29 minutes 12 seconds  ?Findings: ?     Six pedunculated (2 were pedunculated) and sessile polyps were found in  ?     the descending colon, splenic flexure, transverse colon and ascending  ?     colon. The polyps were 2 to 7  mm in size. These polyps were removed with  ?     a cold snare and hot snare (pedunculated polyps). Resection and  ?     retrieval were complete. ?     Scattered small and large-mouthed diverticula were found in the entire  ?     colon. ?Impression:               - Six 2 to 7 mm polyps in the descending colon, at  ?                          the splenic flexure, in the transverse colon and in  ?                          the ascending colon, removed with a cold snare.  ?                          Resected and retrieved. ?                          - Diverticulosis in the entire examined colon. ?Moderate Sedation: ?     Not Applicable - Patient had care per Anesthesia. ?Recommendation:           - Patient  has a contact number available for  ?                          emergencies. The signs and symptoms of potential  ?                          delayed complications were discussed with the  ?                          patient. Return to normal activities tomorrow.  ?                          Written discharge instructions were provided to the  ?                          patient. ?                          - Resume previous diet. ?                          - Continue present medications. ?                          - Await pathology results. ?                          - Repeat colonoscopy in 3 years for surveillance,  ?                          if clinically appropriate. ?Procedure Code(s):        --- Professional --- ?                          [phone], Colonoscopy, flexible; with removal of  ?                          tumor(s), polyp(s), or other lesion(s) by snare  ?                          technique ?Diagnosis Code(s):        --- Professional --- ?                          K63.5, Polyp of colon ?                          R19.5, Other fecal abnormalities ?                          K57.30, Diverticulosis of large intestine without  ?                          perforation or abscess without bleeding ?CPT copyright 2019 American  Medical Association. All rights reserved. ?The codes documented in this report are preliminary and upon coder review may  ?be revised to meet current compliance requirements. ?Carol Ada, MD ?Carol Ada,  MD ?12/15/2021 11:44:36 AM ?This report has been signed electronically. ?Number of Addenda: 0 ?

## 2021-12-18 ENCOUNTER — Encounter (HOSPITAL_COMMUNITY): Payer: Self-pay | Admitting: Gastroenterology

## 2021-12-18 LAB — SURGICAL PATHOLOGY

## 2021-12-19 ENCOUNTER — Ambulatory Visit (INDEPENDENT_AMBULATORY_CARE_PROVIDER_SITE_OTHER): Payer: Medicare Other | Admitting: Neurology

## 2021-12-19 ENCOUNTER — Telehealth: Payer: Self-pay | Admitting: Neurology

## 2021-12-19 VITALS — BP 125/74 | HR 82 | Ht 62.0 in | Wt 195.0 lb

## 2021-12-19 DIAGNOSIS — G43709 Chronic migraine without aura, not intractable, without status migrainosus: Secondary | ICD-10-CM | POA: Diagnosis not present

## 2021-12-19 NOTE — Telephone Encounter (Signed)
N/a. error ?

## 2021-12-19 NOTE — Procedures (Signed)
? ? ? ?  BOTOX PROCEDURE NOTE FOR MIGRAINE HEADACHE ? ? ?HISTORY: Hayley [name] here today for Botox injection for chronic migraine headache.  Last injection was September 21, 2021. Botox has been 75-80% helpful. With Botox  will go some weeks with no headache. A bad week, is 2 migraines, takes Nurtec with fair improvement. Has been [facility], being considered for DBS for tremor.  ? ?Description of procedure: ? ?The patient was placed in a sitting position. The standard protocol was used for Botox as follows, with 5 units of Botox injected at each site: ? ?-Procerus muscle, midline injection ? ?-Corrugator muscle, bilateral injection ? ?-Frontalis muscle, bilateral injection, with 2 sites each side, medial injection was performed in the upper one third of the frontalis muscle, in the region vertical from the medial inferior edge of the superior orbital rim. The lateral injection was again in the upper one third of the forehead vertically above the lateral limbus of the cornea, 1.5 cm lateral to the medial injection site. ? ?-Temporalis muscle injection, 4 sites, bilaterally. The first injection was 3 cm above the tragus of the ear, second injection site was 1.5 cm to 3 cm up from the first injection site in line with the tragus of the ear. The third injection site was 1.5-3 cm forward between the first 2 injection sites. The fourth injection site was 1.5 cm posterior to the second injection site. ? ?-Occipitalis muscle injection, 3 sites, bilaterally. The first injection was done one half way between the occipital protuberance and the tip of the mastoid process behind the ear. The second injection site was done lateral and superior to the first, 1 fingerbreadth from the first injection. The third injection site was 1 fingerbreadth superiorly and medially from the first injection site. ? ?-Cervical paraspinal muscle injection, 2 sites, bilateral, the first injection site was 1 cm from the midline of the cervical spine, 3 cm  inferior to the lower border of the occipital protuberance. The second injection site was 1.5 cm superiorly and laterally to the first injection site. ? ?-Trapezius muscle injection was performed at 3 sites, bilaterally. The first injection site was in the upper trapezius muscle halfway between the inflection point of the neck, and the acromion. The second injection site was one half way between the acromion and the first injection site. The third injection was done between the first injection site and the inflection point of the neck. ? ? ?A 200 unit bottle of Botox was used, 155 units were injected, the rest of the Botox was wasted. The patient tolerated the procedure well, there were no complications of the above procedure. ? ?Botox NDC [phone] ?Lot number [id number] ?Expiration date 05/2024 ?B/B ? ? ?

## 2021-12-19 NOTE — Progress Notes (Signed)
Botox- 200 units x 1 vial ?Lot: [id number] ?Expiration: 05/2024 ?[place]: [phone] ?B/B ? ?

## 2021-12-21 DIAGNOSIS — S20361A Insect bite (nonvenomous) of right front wall of thorax, initial encounter: Secondary | ICD-10-CM | POA: Diagnosis not present

## 2021-12-21 DIAGNOSIS — W57XXXA Bitten or stung by nonvenomous insect and other nonvenomous arthropods, initial encounter: Secondary | ICD-10-CM | POA: Diagnosis not present

## 2021-12-21 DIAGNOSIS — R21 Rash and other nonspecific skin eruption: Secondary | ICD-10-CM | POA: Diagnosis not present

## 2021-12-21 DIAGNOSIS — T7840XA Allergy, unspecified, initial encounter: Secondary | ICD-10-CM | POA: Diagnosis not present

## 2022-01-01 DIAGNOSIS — Z20822 Contact with and (suspected) exposure to covid-19: Secondary | ICD-10-CM | POA: Diagnosis not present

## 2022-01-03 ENCOUNTER — Telehealth: Payer: Self-pay | Admitting: Pulmonary Disease

## 2022-01-04 NOTE — Telephone Encounter (Signed)
I called Hayley [name] with [employer] and she will get some one will get her a oxygen concentrator  today. I let the patient know that she was having issues with her oxygen tank and she has been out for almost a week.  ? ?Patient is aware to call [employer] if she has any further mechanical issues. Nothing further needed.  ?

## 2022-01-04 NOTE — Telephone Encounter (Signed)
This patient seen you last year in 05/22 and she has a follow up next month. She is having trouble with her concentrator and she told me it was not delivering oxygen to the patient and wants to know if you are ok with [place] sending an order for her to get a new oxygen concentrator at home. She has been out of this almost a week. Please advise.  ?

## 2022-01-12 DIAGNOSIS — M5412 Radiculopathy, cervical region: Secondary | ICD-10-CM | POA: Diagnosis not present

## 2022-01-12 DIAGNOSIS — M5013 Cervical disc disorder with radiculopathy, cervicothoracic region: Secondary | ICD-10-CM | POA: Diagnosis not present

## 2022-01-15 DIAGNOSIS — Z20822 Contact with and (suspected) exposure to covid-19: Secondary | ICD-10-CM | POA: Diagnosis not present

## 2022-01-18 DIAGNOSIS — B372 Candidiasis of skin and nail: Secondary | ICD-10-CM | POA: Diagnosis not present

## 2022-01-18 DIAGNOSIS — B370 Candidal stomatitis: Secondary | ICD-10-CM | POA: Diagnosis not present

## 2022-01-18 DIAGNOSIS — E538 Deficiency of other specified B group vitamins: Secondary | ICD-10-CM | POA: Diagnosis not present

## 2022-01-18 DIAGNOSIS — H00013 Hordeolum externum right eye, unspecified eyelid: Secondary | ICD-10-CM | POA: Diagnosis not present

## 2022-01-19 DIAGNOSIS — Z20822 Contact with and (suspected) exposure to covid-19: Secondary | ICD-10-CM | POA: Diagnosis not present

## 2022-01-26 ENCOUNTER — Ambulatory Visit: Payer: Medicare Other | Admitting: Pulmonary Disease

## 2022-02-05 DIAGNOSIS — L570 Actinic keratosis: Secondary | ICD-10-CM | POA: Diagnosis not present

## 2022-02-05 DIAGNOSIS — L814 Other melanin hyperpigmentation: Secondary | ICD-10-CM | POA: Diagnosis not present

## 2022-02-05 DIAGNOSIS — C44519 Basal cell carcinoma of skin of other part of trunk: Secondary | ICD-10-CM | POA: Diagnosis not present

## 2022-02-05 DIAGNOSIS — L821 Other seborrheic keratosis: Secondary | ICD-10-CM | POA: Diagnosis not present

## 2022-02-16 DIAGNOSIS — M5416 Radiculopathy, lumbar region: Secondary | ICD-10-CM | POA: Diagnosis not present

## 2022-02-26 ENCOUNTER — Telehealth: Payer: Self-pay | Admitting: Pulmonary Disease

## 2022-02-26 NOTE — Telephone Encounter (Signed)
Called and spoke with patient who states that starting Friday she has been having symptoms of wheezing and cough that has progressively got worse over the last couple days. She states it sounds like she should cough something up but she does not. She wears 2 liters oxygen at night but has been wearing it today has also done breathing treatments and taken tesslon and has had no relief. Last one was about an hour ago. Denies fever Pharmacy is [facility] or [facility] [place].    Please advise as Dr. Erin Fulling is on vacation this week.

## 2022-02-26 NOTE — Telephone Encounter (Signed)
Called and spoke with patient. She stated that it is hard for her to leave the house at this time. I explained to her how to do a video visit and she was interested in this. I was able to get her scheduled for a video visit with Katie tomorrow at 10am. She would like to have the link texted to her.   Nothing further needed at time of call.

## 2022-02-26 NOTE — Telephone Encounter (Signed)
She hasn't been seen in over a year (09/2020). Given her symptoms, needs OV for further evaluation. Continue breathing treatments every 4-6 hours as needed. Advise her to monitor her oxygen for goal >88-90%.If no availability, recommend further evaluation at urgent care or ED if symptoms are severe. Thanks.

## 2022-02-27 ENCOUNTER — Telehealth: Payer: Medicare Other | Admitting: Nurse Practitioner

## 2022-03-06 ENCOUNTER — Telehealth (HOSPITAL_BASED_OUTPATIENT_CLINIC_OR_DEPARTMENT_OTHER): Payer: Medicare Other | Admitting: Psychiatry

## 2022-03-06 ENCOUNTER — Encounter (HOSPITAL_COMMUNITY): Payer: Self-pay | Admitting: Psychiatry

## 2022-03-06 DIAGNOSIS — F322 Major depressive disorder, single episode, severe without psychotic features: Secondary | ICD-10-CM | POA: Diagnosis not present

## 2022-03-06 DIAGNOSIS — F419 Anxiety disorder, unspecified: Secondary | ICD-10-CM

## 2022-03-06 MED ORDER — VORTIOXETINE HBR 10 MG PO TABS: 10.0000 mg | ORAL_TABLET | Freq: Every day | ORAL | 0 refills | Status: DC

## 2022-03-06 MED ORDER — TRAZODONE HCL 150 MG PO TABS: 150.0000 mg | ORAL_TABLET | Freq: Every day | ORAL | 0 refills | Status: DC

## 2022-03-06 MED ORDER — LORAZEPAM 0.5 MG PO TABS: 0.5000 mg | ORAL_TABLET | Freq: Every day | ORAL | 1 refills | Status: DC | PRN

## 2022-03-06 NOTE — Progress Notes (Signed)
Virtual Visit via Telephone Note  I connected with Hayley [name] on 03/06/22 at  2:20 PM EDT by telephone and verified that I am speaking with the correct person using two identifiers.  Location: Patient: In Car Provider: Home Office   I discussed the limitations, risks, security and privacy concerns of performing an evaluation and management service by telephone and the availability of in person appointments. I also discussed with the patient that there may be a patient responsible charge related to this service. The patient expressed understanding and agreed to proceed.   History of Present Illness: Patient is evaluated by phone session.  She reported around April she had a lot of anxiety, depression and feeling down.  She was having nausea and started to have GI issues.  She had colonoscopy and polyps were removed which are premalignant.  She admitted at that time she was very depressed, lonely and had no energy but now she is feeling better.  She admitted at that time she was having passive and ruminative thoughts but having prayer every day and company of the animals helped her a lot.  She is not doing much better.  She is sleeping better.  She started to go outside and spend time on walking.  She has chronic health issues but she reported stable and manageable.  She is sleeping better.  She denies any anhedonia, feeling of hopelessness or worthlessness.  She denies any crying spells or any suicidal thoughts.  She admitted weight loss because of nausea but her appetite is getting better.  She has chronic tremors.  She takes Ativan as needed.  She denies drinking or using any illegal substances.  Past Psychiatric History: Reviewed. H/O depression and anxiety.  H/O suicidal attempt on Percocet in March 2019.  Took Effexor in her 40s but stopped after working for a while.  Tried Zoloft, Paxil and Wellbutrin from PCP but did not work. Did IOP, PHP and given Prozac but no improvement. Tried lexapro  caused GI s/e, Lamictal caused increase tremors.   Psychiatric Specialty Exam: Physical Exam  Review of Systems  Weight 180 lb (81.6 kg), last menstrual period 09/18/1979.There is no height or weight on file to calculate BMI.  General Appearance: NA  Eye Contact:  NA  Speech:  Slow  Volume:  Decreased  Mood:  Dysphoric  Affect:  NA  Thought Process:  Descriptions of Associations: Intact  Orientation:  Full (Time, Place, and Person)  Thought Content:  Rumination  Suicidal Thoughts:  No  Homicidal Thoughts:  No  Memory:  Immediate;   Good Recent;   Good Remote;   Fair  Judgement:  Intact  Insight:  Present  Psychomotor Activity:  Normal  Concentration:  Concentration: Fair and Attention Span: Fair  Recall:  Good  Fund of Knowledge:  Good  Language:  Good  Akathisia:  No  Handed:  Right  AIMS (if indicated):     Assets:  Communication Skills Desire for Improvement Housing Transportation  ADL's:  Intact  Cognition:  WNL  Sleep:   ok      Assessment and Plan: Major depressive disorder, recurrent.  Anxiety.  Discussed bouts of depression.  Recommended to try higher dose of Trintellix but patient like to keep the same medication since doing better.  She like to have a follow-up appointment in 2 months.  We will continue Trintellix 10 mg daily, trazodone 150 mg at bedtime and Ativan 0.5 mg to take as needed.  Recommended to call us back  if she has any question or any concern.  Follow Up Instructions:    I discussed the assessment and treatment plan with the patient. The patient was provided an opportunity to ask questions and all were answered. The patient agreed with the plan and demonstrated an understanding of the instructions.   The patient was advised to call back or seek an in-person evaluation if the symptoms worsen or if the condition fails to improve as anticipated.  Collaboration of Care: Primary Care Provider AEB notes are in epic to review.  Patient/Guardian  was advised Release of Information must be obtained prior to any record release in order to collaborate their care with an outside provider. Patient/Guardian was advised if they have not already done so to contact the registration department to sign all necessary forms in order for [place] to release information regarding their care.   Consent: Patient/Guardian gives verbal consent for treatment and assignment of benefits for services provided during this visit. Patient/Guardian expressed understanding and agreed to proceed.    I provided 20 minutes of non-face-to-face time during this encounter.   Hayley Nations, MD

## 2022-03-07 ENCOUNTER — Ambulatory Visit (INDEPENDENT_AMBULATORY_CARE_PROVIDER_SITE_OTHER): Payer: Medicare Other | Admitting: Pulmonary Disease

## 2022-03-07 ENCOUNTER — Encounter: Payer: Self-pay | Admitting: Pulmonary Disease

## 2022-03-07 VITALS — BP 120/62 | HR 72 | Ht 62.0 in | Wt 186.4 lb

## 2022-03-07 DIAGNOSIS — R0982 Postnasal drip: Secondary | ICD-10-CM | POA: Diagnosis not present

## 2022-03-07 DIAGNOSIS — R053 Chronic cough: Secondary | ICD-10-CM | POA: Diagnosis not present

## 2022-03-07 DIAGNOSIS — J4520 Mild intermittent asthma, uncomplicated: Secondary | ICD-10-CM

## 2022-03-07 MED ORDER — PROMETHAZINE-DM 6.25-15 MG/5ML PO SYRP: 5.0000 mL | ORAL_SOLUTION | Freq: Four times a day (QID) | ORAL | 1 refills | Status: DC | PRN

## 2022-03-07 MED ORDER — IPRATROPIUM BROMIDE 0.03 % NA SOLN: 2.0000 | Freq: Two times a day (BID) | NASAL | 12 refills | Status: DC

## 2022-03-07 MED ORDER — DULERA 100-5 MCG/ACT IN AERO: 2.0000 | INHALATION_SPRAY | Freq: Two times a day (BID) | RESPIRATORY_TRACT | 6 refills | Status: DC | PRN

## 2022-03-07 NOTE — Progress Notes (Unsigned)
Synopsis: Referred in January 2022 for cough  Subjective:   PATIENT ID: Hayley [name] GENDER: female DOB: [birth year], MRN: [id number]  HPI  Chief Complaint  Patient presents with   Follow-up    Overdue f/u. States she had a bad wheezing episode about 2 weeks ago. Has gone back to using her O2 some during the day.    Hayley [name] is a 76 year old woman, former smoker with spasmodic dysphonia, GERD and sleep apnea who is referred to pulmonary clinic for evaluation of cough.   Had episode of cough, wheezing and dyspnea on 6/12 that did not respond to a nebulizer treatment or tessalon perles. Breathing is much better as the episode self resolved.   She reports digestive flare ups over the past week and a half. She has woken up to vomiting and had diarrhea a week and a half ago. Continues to have loose bowels. She is not eating much as it is not appetizing.   Has post nasal drainage.   Using O2 at night.   OV 10/13/20 She was previously seen by Dr. [place] Bells in 2018 and was treated for concern of bronchiectasis given her cough symptoms at that time.   She reports over the past 4 months her cough has become more constant and she has developed right sided chest discomfort from coughing. She does report some wheezing. She uses combivent as needed with some relief. She feels like her throat is irritated and continues to trigger her to cough but she also feels she has chest congestion at times. She complains of sinus congestion and runny nose. She also complains of difficulty swallowing and MBS in 2017 indicated a mild aspiration risk.    Past Medical History:  Diagnosis Date   Anemia    Anxiety    Bronchitis    hx of    Clotting disorder ([place]) 02-17-14   S/p appendectomy-developed Pulmonary emboli-tx. warfarin-d/c 1 yr ago.   Colon polyps    Depression    Diabetes mellitus without complication ([place])    "borderline"- oral med   Disorder of vocal cords    spasmotic dysphonia ,02-17-14 has"  whispery voice-low tone"   Hyperthyroidism    Kidney stones    Migraines    Nausea Nov 15, 2014   cycle of migraine headaches and nausea   Neuromuscular disorder ([place])    hands and throat"spasmodic dysphonia", tremors - thimb and forefinger    Obesity, Class III, BMI 40-49.9 (morbid obesity) ([place]) 04/27/2010   02-17-14 reports some weight loss- intentional   Osteoarthritis    in back(chronic pain)   Pelvic pain    02-17-14 "states thinks its back related on left groin"   Pulmonary emboli ([facility])    s/p Appendectomy '13 [facility]   Reflux    Right knee pain    Sleep apnea    cpap - settings at 4    Toxic goiter      Family History  Problem Relation Age of Onset   Heart disease Mother    Pneumonia Father    Cirrhosis Father    Hypertension Father    Alcoholism Father    Heart attack Paternal Grandfather    Heart attack Maternal Grandfather    Heart attack Paternal Grandmother    Heart attack Maternal Grandmother    Breast cancer Daughter      Social History   Socioeconomic History   Marital status: Widowed    Spouse name: Not on file   Number of  children: 5   Years of education: 16   Highest education level: Not on file  Occupational History   Occupation: Retired  Tobacco Use   Smoking status: Former    Packs/day: 0.25    Years: 5.00    Total pack years: 1.25    Types: Cigarettes    Quit date: 01/03/1982    Years since quitting: 40.2   Smokeless tobacco: Never  Vaping Use   Vaping Use: Never used  Substance and Sexual Activity   Alcohol use: No   Drug use: No   Sexual activity: Not Currently  Other Topics Concern   Not on file  Social History Narrative   Lives at home alone at this time.   Right-handed.   2 cups caffeine per day.   Social Determinants of Health   Financial Resource Strain: Medium Risk (01/21/2018)   Overall Financial Resource Strain (CARDIA)    Difficulty of Paying Living Expenses: Somewhat hard  Food Insecurity: Food Insecurity Present  (01/21/2018)   Hunger Vital Sign    Worried About Running Out of Food in the Last Year: Sometimes true    Ran Out of Food in the Last Year: Sometimes true  Transportation Needs: Unmet Transportation Needs (01/21/2018)   PRAPARE - Hydrologist (Medical): Yes    Lack of Transportation (Non-Medical): Yes  Physical Activity: Inactive (01/21/2018)   Exercise Vital Sign    Days of Exercise per Week: 0 days    Minutes of Exercise per Session: 0 min  Stress: Stress Concern Present (01/21/2018)   [place]    Feeling of Stress : Very much  Social Connections: Moderately Isolated (01/21/2018)   Social Connection and Isolation Panel [NHANES]    Frequency of Communication with Friends and Family: Once a week    Frequency of Social Gatherings with Friends and Family: Once a week    Attends Religious Services: More than 4 times per year    Active Member of [employer] or Organizations: No    Attends Archivist Meetings: Never    Marital Status: Separated  Intimate Partner Violence: Not At Risk (01/21/2018)   Humiliation, Afraid, Rape, and Kick questionnaire    Fear of Current or Ex-Partner: No    Emotionally Abused: No    Physically Abused: No    Sexually Abused: No     Allergies  Allergen Reactions   Bee Venom Anaphylaxis   Morphine Other (See Comments)    Hallucinations   Morphine And Related    Tape Rash    Rash - only use paper tape   Etodolac Diarrhea and Nausea And Vomiting   Other Itching and Swelling    Bee venom   Penicillins Rash    Has patient had a PCN reaction causing immediate rash, facial/tongue/throat swelling, SOB or lightheadedness with hypotension: Yes Has patient had a PCN reaction causing severe rash involving mucus membranes or skin necrosis: No Has patient had a PCN reaction that required hospitalization No Has patient had a PCN reaction occurring within the last 10 years:  No If all of the above answers are "NO", then may proceed with Cephalosporin use.    Poison Ivy Extract  [Poison Ivy Extract] Itching and Swelling   Propoxyphene Nausea And Vomiting   Propoxyphene N-Acetaminophen Nausea And Vomiting     Outpatient Medications Prior to Visit  Medication Sig Dispense Refill   albuterol (PROVENTIL) (2.5 MG/3ML) 0.083% nebulizer solution Take 2.5  mg by nebulization every 4 (four) hours as needed for wheezing or shortness of breath.     apixaban (ELIQUIS) 2.5 MG TABS tablet Take 1 tablet (2.5 mg total) by mouth 2 (two) times daily. 180 tablet 4   clindamycin (CLEOCIN) 300 MG capsule Take 600 mg by mouth once.     diclofenac (FLECTOR) 1.3 % PTCH Place 1 patch onto the skin daily. (Patient taking differently: Place 1 patch onto the skin daily as needed (Joint and muscle pain).) 20 patch 1   HYDROcodone-acetaminophen (NORCO) 10-325 MG tablet Take 1 tablet by mouth every 6 (six) hours.     hydrOXYzine (ATARAX/VISTARIL) 25 MG tablet Take 1 tablet (25 mg total) by mouth 3 (three) times daily as needed for anxiety, itching or nausea. 30 tablet 0   hyoscyamine (ANASPAZ) 0.125 MG TBDP disintergrating tablet Place 0.125 mg under the tongue every 4 (four) hours as needed for cramping (Disgestion).     Ipratropium-Albuterol (COMBIVENT IN) Inhale 1 application. into the lungs daily as needed (Wheezing).     levothyroxine (SYNTHROID, LEVOTHROID) 25 MCG tablet Take 25 mcg by mouth every morning.      LORazepam (ATIVAN) 0.5 MG tablet Take 1 tablet (0.5 mg total) by mouth daily as needed for anxiety. 15 tablet 1   metoCLOPramide (REGLAN) 5 MG tablet Take 5 mg by mouth 3 (three) times daily as needed for nausea.     nystatin (MYCOSTATIN) 100000 UNIT/ML suspension Use as directed in the mouth or throat daily as needed (Thrush).     promethazine-dextromethorphan (PROMETHAZINE-DM) 6.25-15 MG/5ML syrup Take 5 mLs by mouth every 6 (six) hours as needed for cough or wheezing.      propranolol (INDERAL) 10 MG tablet Take 1 tablet (10 mg total) by mouth 2 (two) times daily as needed. (Patient taking differently: Take 10 mg by mouth 2 (two) times daily as needed. 30 in before going out) 60 tablet 11   propranolol ER (INDERAL LA) 120 MG 24 hr capsule Take 1 capsule (120 mg total) by mouth daily. 90 capsule 3   Rimegepant Sulfate (NURTEC) 75 MG TBDP Take 1 tab at onset of migraine.  May repeat in 2 hrs, if needed.  Max dose: 2 tabs/day. This is a 30 day prescription. 15 tablet 5   simethicone (MYLICON) 80 MG chewable tablet Chew 1 tablet (80 mg total) by mouth 4 (four) times daily as needed for flatulence (belching). 30 tablet 0   tiZANidine (ZANAFLEX) 4 MG tablet Take 4 mg by mouth at bedtime as needed for muscle spasms.     traZODone (DESYREL) 150 MG tablet Take 1 tablet (150 mg total) by mouth at bedtime. 90 tablet 0   valACYclovir (VALTREX) 1000 MG tablet Take 2,000 mg by mouth 2 (two) times daily as needed (Fever blisters). Reported on 01/10/2016  2   vortioxetine HBr (TRINTELLIX) 10 MG TABS tablet Take 1 tablet (10 mg total) by mouth daily. 90 tablet 0   No facility-administered medications prior to visit.    Review of Systems  Constitutional:  Negative for chills, fever, malaise/fatigue and weight loss.  HENT:  Positive for congestion. Negative for sinus pain and sore throat.   Eyes: Negative.   Respiratory:  Positive for cough, sputum production and shortness of breath. Negative for hemoptysis and wheezing.   Cardiovascular:  Negative for chest pain, palpitations, orthopnea, claudication and leg swelling.  Gastrointestinal:  Positive for abdominal pain. Negative for heartburn, nausea and vomiting.  Genitourinary: Negative.   Musculoskeletal:  Positive  for joint pain. Negative for myalgias.  Skin:  Negative for rash.  Neurological:  Positive for headaches. Negative for weakness.  Endo/Heme/Allergies: Negative.   Psychiatric/Behavioral:  Positive for depression. The  patient is nervous/anxious.       Objective:   Vitals:   03/07/22 1557  BP: 120/62  Pulse: 72  SpO2: 94%  Weight: 186 lb 6.4 oz (84.6 kg)  Height: '5\' 2"'$  (1.575 m)   Physical Exam Constitutional:      General: She is not in acute distress.    Appearance: She is obese. She is not ill-appearing.  HENT:     Head: Normocephalic and atraumatic.  Eyes:     General: No scleral icterus.    Conjunctiva/sclera: Conjunctivae normal.     Pupils: Pupils are equal, round, and reactive to light.  Cardiovascular:     Rate and Rhythm: Normal rate and regular rhythm.     Pulses: Normal pulses.     Heart sounds: Normal heart sounds. No murmur heard. Pulmonary:     Effort: Pulmonary effort is normal.     Breath sounds: Normal breath sounds. No wheezing, rhonchi or rales.  Abdominal:     General: Bowel sounds are normal.     Palpations: Abdomen is soft.  Musculoskeletal:     Right lower leg: No edema.     Left lower leg: No edema.  Lymphadenopathy:     Cervical: No cervical adenopathy.  Skin:    General: Skin is warm and dry.  Neurological:     General: No focal deficit present.     Mental Status: She is alert.  Psychiatric:        Mood and Affect: Mood normal.        Behavior: Behavior normal.        Thought Content: Thought content normal.        Judgment: Judgment normal.    CBC    Component Value Date/Time   WBC 4.7 10/13/2020 1058   RBC 4.98 10/13/2020 1058   HGB 14.3 10/13/2020 1058   HGB 14.0 12/25/2017 1217   HGB 14.1 12/12/2016 1105   HCT 43.1 10/13/2020 1058   HCT 43.5 12/12/2016 1105   PLT 265.0 10/13/2020 1058   PLT 227 12/25/2017 1217   PLT 239 12/12/2016 1105   MCV 86.5 10/13/2020 1058   MCV 86.7 12/12/2016 1105   MCH 27.4 04/02/2019 0500   MCHC 33.1 10/13/2020 1058   RDW 13.2 10/13/2020 1058   RDW 14.4 12/12/2016 1105   LYMPHSABS 1.1 10/13/2020 1058   LYMPHSABS 1.4 12/12/2016 1105   MONOABS 0.5 10/13/2020 1058   MONOABS 0.6 12/12/2016 1105   EOSABS  0.1 10/13/2020 1058   EOSABS 0.3 12/12/2016 1105   BASOSABS 0.0 10/13/2020 1058   BASOSABS 0.0 12/12/2016 1105      Latest Ref Rng & Units 04/04/2019    3:59 AM 04/01/2019    4:07 AM 03/31/2019    4:11 PM  BMP  Glucose 70 - 99 mg/dL  180  92   BUN 8 - 23 mg/dL  7  6   Creatinine 0.44 - 1.00 mg/dL 0.71  0.84  0.75   Sodium 135 - 145 mmol/L  139  141   Potassium 3.5 - 5.1 mmol/L  3.6  4.2   Chloride 98 - 111 mmol/L  102  104   CO2 22 - 32 mmol/L  28  27   Calcium 8.9 - 10.3 mg/dL  8.7  8.9    Chest  imaging: CXR 10/03/20 Cardiac shadow is within normal limits. Spinal stimulator is noted. Bilateral breast implants are seen. No focal infiltrate or effusion is noted. Degenerative changes of the thoracic spine seen.  CTA Chest 04/01/2019 1. No evidence of large central or segmental pulmonary embolus. Respiratory motion may limit detection of smaller subsegmental pulmonary emboli. 2. Bandlike atelectatic changes are present at both bases. Geographic regions of mosaic attenuation are likely related to imaging during exhalation. No suspicious pulmonary nodules or masses. 3. Coronary atherosclerosis. 4.  Aortic Atherosclerosis  HRCT Chest 2018: 1. Scarring in the lingula and both lower lobes. No evidence of interstitial lung disease. 2. Aortic atherosclerosis (ICD10-170.0). Moderate coronary artery calcification. 3. Hepatic steatosis. Probable intense fat deposition within the caudate. Difficult to exclude a true lesion. If further evaluation is desired, MR abdomen without and with contrast is recommended.  PFT:     No data to display          Echo: 04/01/2019  1. The left ventricle has normal systolic function, with an ejection  fraction of 55-60%. The cavity size was normal. Left ventricular diastolic  Doppler parameters are consistent with impaired relaxation.   2. The right ventricle has normal systolic function. The cavity was  normal. There is no increase in right  ventricular wall thickness.   3. Left atrial size was mildly dilated.   4. Right atrial size was mildly dilated.   5. No stenosis of the aortic valve.   6. The aortic root and ascending aorta are normal in size and structure.   7. The interatrial septum was not assessed.   EGD 2011: Small hiatal hernia  MBS 2017 Speech Therapy Mild Aspiration Risk  Assessment & Plan:   No diagnosis found.  Discussion: Hayley [name] is a 75 year old woman, former smoker with spasmodic dysphonia, GERD and sleep apnea who is referred to pulmonary clinic for evaluation of cough.   It is difficult to determine the etiology of her cough at this time. We will start her on nebulizer treatments 2-3 times daily with flutter valve treatment for bronchial hygiene and mucous clearance.   She does have hiatal hernia and GERD which could be contributing to her cough. Her chest radiograph and lab work today are reassuring. No elevation in eosinophils or IgE. She could certainly be having aspiration events that are leading to her cough based on her MBS from 2017. We can consider repeating this if clinical history becomes more concerning.  Follow up in 2 months.  Freda Jackson, MD [facility] Office: [phone]   See Amion for Pager Details      Current Outpatient Medications:    albuterol (PROVENTIL) (2.5 MG/3ML) 0.083% nebulizer solution, Take 2.5 mg by nebulization every 4 (four) hours as needed for wheezing or shortness of breath., Disp: , Rfl:    apixaban (ELIQUIS) 2.5 MG TABS tablet, Take 1 tablet (2.5 mg total) by mouth 2 (two) times daily., Disp: 180 tablet, Rfl: 4   clindamycin (CLEOCIN) 300 MG capsule, Take 600 mg by mouth once., Disp: , Rfl:    diclofenac (FLECTOR) 1.3 % PTCH, Place 1 patch onto the skin daily. (Patient taking differently: Place 1 patch onto the skin daily as needed (Joint and muscle pain).), Disp: 20 patch, Rfl: 1   HYDROcodone-acetaminophen (NORCO) 10-325 MG  tablet, Take 1 tablet by mouth every 6 (six) hours., Disp: , Rfl:    hydrOXYzine (ATARAX/VISTARIL) 25 MG tablet, Take 1 tablet (25 mg total) by mouth  3 (three) times daily as needed for anxiety, itching or nausea., Disp: 30 tablet, Rfl: 0   hyoscyamine (ANASPAZ) 0.125 MG TBDP disintergrating tablet, Place 0.125 mg under the tongue every 4 (four) hours as needed for cramping (Disgestion)., Disp: , Rfl:    Ipratropium-Albuterol (COMBIVENT IN), Inhale 1 application. into the lungs daily as needed (Wheezing)., Disp: , Rfl:    levothyroxine (SYNTHROID, LEVOTHROID) 25 MCG tablet, Take 25 mcg by mouth every morning. , Disp: , Rfl:    LORazepam (ATIVAN) 0.5 MG tablet, Take 1 tablet (0.5 mg total) by mouth daily as needed for anxiety., Disp: 15 tablet, Rfl: 1   metoCLOPramide (REGLAN) 5 MG tablet, Take 5 mg by mouth 3 (three) times daily as needed for nausea., Disp: , Rfl:    nystatin (MYCOSTATIN) 100000 UNIT/ML suspension, Use as directed in the mouth or throat daily as needed (Thrush)., Disp: , Rfl:    promethazine-dextromethorphan (PROMETHAZINE-DM) 6.25-15 MG/5ML syrup, Take 5 mLs by mouth every 6 (six) hours as needed for cough or wheezing., Disp: , Rfl:    propranolol (INDERAL) 10 MG tablet, Take 1 tablet (10 mg total) by mouth 2 (two) times daily as needed. (Patient taking differently: Take 10 mg by mouth 2 (two) times daily as needed. 30 in before going out), Disp: 60 tablet, Rfl: 11   propranolol ER (INDERAL LA) 120 MG 24 hr capsule, Take 1 capsule (120 mg total) by mouth daily., Disp: 90 capsule, Rfl: 3   Rimegepant Sulfate (NURTEC) 75 MG TBDP, Take 1 tab at onset of migraine.  May repeat in 2 hrs, if needed.  Max dose: 2 tabs/day. This is a 30 day prescription., Disp: 15 tablet, Rfl: 5   simethicone (MYLICON) 80 MG chewable tablet, Chew 1 tablet (80 mg total) by mouth 4 (four) times daily as needed for flatulence (belching)., Disp: 30 tablet, Rfl: 0   tiZANidine (ZANAFLEX) 4 MG tablet, Take 4 mg by  mouth at bedtime as needed for muscle spasms., Disp: , Rfl:    traZODone (DESYREL) 150 MG tablet, Take 1 tablet (150 mg total) by mouth at bedtime., Disp: 90 tablet, Rfl: 0   valACYclovir (VALTREX) 1000 MG tablet, Take 2,000 mg by mouth 2 (two) times daily as needed (Fever blisters). Reported on 01/10/2016, Disp: , Rfl: 2   vortioxetine HBr (TRINTELLIX) 10 MG TABS tablet, Take 1 tablet (10 mg total) by mouth daily., Disp: 90 tablet, Rfl: 0

## 2022-03-07 NOTE — Patient Instructions (Addendum)
Try dulera inhaler 2 puffs twice daily as needed for cough, wheezing or shortness of breath  Continue albuterol inhaler or neblizer treatments during these episodes.   Start ipratropium nasasl spray, 1-2 sprays twice daily for your post nasal drainage.   You can also try fluticasone nasal spray daily  Follow up in 6 months.

## 2022-03-08 DIAGNOSIS — R194 Change in bowel habit: Secondary | ICD-10-CM | POA: Diagnosis not present

## 2022-03-08 DIAGNOSIS — K219 Gastro-esophageal reflux disease without esophagitis: Secondary | ICD-10-CM | POA: Diagnosis not present

## 2022-03-08 DIAGNOSIS — E669 Obesity, unspecified: Secondary | ICD-10-CM | POA: Diagnosis not present

## 2022-03-08 DIAGNOSIS — R112 Nausea with vomiting, unspecified: Secondary | ICD-10-CM | POA: Diagnosis not present

## 2022-03-10 ENCOUNTER — Encounter: Payer: Self-pay | Admitting: Pulmonary Disease

## 2022-03-13 DIAGNOSIS — L237 Allergic contact dermatitis due to plants, except food: Secondary | ICD-10-CM | POA: Diagnosis not present

## 2022-03-13 DIAGNOSIS — C44519 Basal cell carcinoma of skin of other part of trunk: Secondary | ICD-10-CM | POA: Diagnosis not present

## 2022-03-13 DIAGNOSIS — Z85828 Personal history of other malignant neoplasm of skin: Secondary | ICD-10-CM | POA: Diagnosis not present

## 2022-03-14 ENCOUNTER — Other Ambulatory Visit: Payer: Self-pay | Admitting: *Deleted

## 2022-03-14 ENCOUNTER — Ambulatory Visit: Payer: Medicare Other | Admitting: Neurology

## 2022-03-14 MED ORDER — NURTEC 75 MG PO TBDP
ORAL_TABLET | ORAL | 5 refills | Status: DC
Start: 2022-03-14 — End: 2022-06-27

## 2022-03-23 ENCOUNTER — Telehealth: Payer: Self-pay | Admitting: Neurology

## 2022-03-23 NOTE — Telephone Encounter (Signed)
Pt calling to cancel Botox appt due to have sutures taken out. Would like a call back to reschedule appt.

## 2022-03-26 NOTE — Telephone Encounter (Signed)
I called pt left a message on her VM to get Botox appt rescheduled.

## 2022-03-26 NOTE — Telephone Encounter (Signed)
Pt called back was able to get appt rescheduled to 07/19.

## 2022-03-27 DIAGNOSIS — H43812 Vitreous degeneration, left eye: Secondary | ICD-10-CM | POA: Diagnosis not present

## 2022-03-28 ENCOUNTER — Ambulatory Visit: Payer: Medicare Other | Admitting: Neurology

## 2022-04-03 ENCOUNTER — Other Ambulatory Visit (HOSPITAL_COMMUNITY): Payer: Self-pay

## 2022-04-04 ENCOUNTER — Telehealth: Payer: Self-pay

## 2022-04-04 ENCOUNTER — Ambulatory Visit (INDEPENDENT_AMBULATORY_CARE_PROVIDER_SITE_OTHER): Payer: Medicare Other | Admitting: Neurology

## 2022-04-04 ENCOUNTER — Other Ambulatory Visit (HOSPITAL_COMMUNITY): Payer: Self-pay

## 2022-04-04 VITALS — BP 114/78 | HR 74 | Ht 62.0 in | Wt 186.0 lb

## 2022-04-04 DIAGNOSIS — G43709 Chronic migraine without aura, not intractable, without status migrainosus: Secondary | ICD-10-CM

## 2022-04-04 MED ORDER — ONABOTULINUMTOXINA 200 UNITS IJ SOLR
200.0000 [IU] | Freq: Once | INTRAMUSCULAR | Status: AC
  Administered 2022-04-04: 200 [IU] via INTRAMUSCULAR

## 2022-04-04 NOTE — Progress Notes (Signed)
Botox 200 units x 1 vial [id number] Exp-2026/02 B/b

## 2022-04-04 NOTE — Procedures (Signed)
     BOTOX PROCEDURE NOTE FOR MIGRAINE HEADACHE   HISTORY: Hayley [name] is here today for Botox injection migraine headache.  Last Botox was December 19, 2021.  Botox continues to be quite helpful.  It does wear off a few weeks before the next injection.  She has been having more headache, as she is a few weeks late for her injection. She brought some lidocaine spray she insists me to use before the injection.    Description of procedure:  The patient was placed in a sitting position. The standard protocol was used for Botox as follows, with 5 units of Botox injected at each site:   -Procerus muscle, midline injection  -Corrugator muscle, bilateral injection  -Frontalis muscle, bilateral injection, with 2 sites each side, medial injection was performed in the upper one third of the frontalis muscle, in the region vertical from the medial inferior edge of the superior orbital rim. The lateral injection was again in the upper one third of the forehead vertically above the lateral limbus of the cornea, 1.5 cm lateral to the medial injection site.  -Temporalis muscle injection, 4 sites, bilaterally. The first injection was 3 cm above the tragus of the ear, second injection site was 1.5 cm to 3 cm up from the first injection site in line with the tragus of the ear. The third injection site was 1.5-3 cm forward between the first 2 injection sites. The fourth injection site was 1.5 cm posterior to the second injection site.  -Occipitalis muscle injection, 3 sites, bilaterally. The first injection was done one half way between the occipital protuberance and the tip of the mastoid process behind the ear. The second injection site was done lateral and superior to the first, 1 fingerbreadth from the first injection. The third injection site was 1 fingerbreadth superiorly and medially from the first injection site.  -Cervical paraspinal muscle injection, 2 sites, bilateral, the first injection site was 1 cm from  the midline of the cervical spine, 3 cm inferior to the lower border of the occipital protuberance. The second injection site was 1.5 cm superiorly and laterally to the first injection site.  -Trapezius muscle injection was performed at 3 sites, bilaterally. The first injection site was in the upper trapezius muscle halfway between the inflection point of the neck, and the acromion. The second injection site was one half way between the acromion and the first injection site. The third injection was done between the first injection site and the inflection point of the neck.   A 200 unit bottle of Botox was used, 155 units were injected, the rest of the Botox was wasted. The patient tolerated the procedure well, there were no complications of the above procedure.  Botox NDC 1027-2536-64 Lot number [id number] Expiration date 10/2024

## 2022-04-04 NOTE — Telephone Encounter (Signed)
Patient Advocate Encounter   Received notification that prior authorization for Hayley [name] is required.   PA submitted on 04/04/2022 Key : [id number]  Status is pending

## 2022-04-09 NOTE — Telephone Encounter (Signed)
Patient Advocate Encounter  Received a fax regarding Prior Authorization from [place] for [facility] 52mg/act.  Authorization has been DENIED due to criteria not met. This plan requires trial and failure of Breo and Advair before authorizing Dulera.  SClista Bernhardt CPhT Pharmacy Patient Advocate Specialist [facility]ExcelPatient Advocate Team Phone: [phone]  Fax: [phone]

## 2022-04-12 DIAGNOSIS — R634 Abnormal weight loss: Secondary | ICD-10-CM | POA: Diagnosis not present

## 2022-04-12 DIAGNOSIS — E538 Deficiency of other specified B group vitamins: Secondary | ICD-10-CM | POA: Diagnosis not present

## 2022-04-12 DIAGNOSIS — Z91038 Other insect allergy status: Secondary | ICD-10-CM | POA: Diagnosis not present

## 2022-04-12 DIAGNOSIS — R031 Nonspecific low blood-pressure reading: Secondary | ICD-10-CM | POA: Diagnosis not present

## 2022-04-12 DIAGNOSIS — L299 Pruritus, unspecified: Secondary | ICD-10-CM | POA: Diagnosis not present

## 2022-04-12 DIAGNOSIS — B001 Herpesviral vesicular dermatitis: Secondary | ICD-10-CM | POA: Diagnosis not present

## 2022-04-12 DIAGNOSIS — E119 Type 2 diabetes mellitus without complications: Secondary | ICD-10-CM | POA: Diagnosis not present

## 2022-04-13 DIAGNOSIS — M5412 Radiculopathy, cervical region: Secondary | ICD-10-CM | POA: Diagnosis not present

## 2022-04-13 DIAGNOSIS — M5136 Other intervertebral disc degeneration, lumbar region: Secondary | ICD-10-CM | POA: Diagnosis not present

## 2022-04-13 DIAGNOSIS — M5030 Other cervical disc degeneration, unspecified cervical region: Secondary | ICD-10-CM | POA: Diagnosis not present

## 2022-04-13 DIAGNOSIS — M47816 Spondylosis without myelopathy or radiculopathy, lumbar region: Secondary | ICD-10-CM | POA: Diagnosis not present

## 2022-04-23 ENCOUNTER — Other Ambulatory Visit: Payer: Self-pay | Admitting: *Deleted

## 2022-04-23 NOTE — Patient Outreach (Signed)
  Care Coordination   04/23/2022 Name: Hayley [name] MRN: [id number] DOB: 06-16-1946   Care Coordination Outreach Attempts:  An unsuccessful telephone outreach was attempted today to offer the patient information about available care coordination services as a benefit of their health plan.   Follow Up Plan:  Additional outreach attempts will be made to offer the patient care coordination information and services.   Encounter Outcome:  No Answer  Care Coordination Interventions Activated:  No   Care Coordination Interventions:  No, not indicated    Raina Mina, RN Care Management Coordinator [place] Office [phone]

## 2022-05-01 ENCOUNTER — Other Ambulatory Visit (HOSPITAL_COMMUNITY): Payer: Self-pay

## 2022-05-07 ENCOUNTER — Telehealth (HOSPITAL_BASED_OUTPATIENT_CLINIC_OR_DEPARTMENT_OTHER): Payer: Medicare Other | Admitting: Psychiatry

## 2022-05-07 ENCOUNTER — Encounter (HOSPITAL_COMMUNITY): Payer: Self-pay | Admitting: Psychiatry

## 2022-05-07 DIAGNOSIS — F322 Major depressive disorder, single episode, severe without psychotic features: Secondary | ICD-10-CM | POA: Diagnosis not present

## 2022-05-07 DIAGNOSIS — F419 Anxiety disorder, unspecified: Secondary | ICD-10-CM

## 2022-05-07 MED ORDER — VORTIOXETINE HBR 10 MG PO TABS: 10.0000 mg | ORAL_TABLET | Freq: Every day | ORAL | 0 refills | Status: DC

## 2022-05-07 MED ORDER — TRAZODONE HCL 150 MG PO TABS: 150.0000 mg | ORAL_TABLET | Freq: Every day | ORAL | 0 refills | Status: DC

## 2022-05-07 MED ORDER — LORAZEPAM 0.5 MG PO TABS: 0.5000 mg | ORAL_TABLET | Freq: Every day | ORAL | 1 refills | Status: DC | PRN

## 2022-05-07 NOTE — Progress Notes (Signed)
Virtual Visit via Telephone Note  I connected with Hayley [name] on 05/07/22 at  2:40 PM EDT by telephone and verified that I am speaking with the correct person using two identifiers.  Location: Patient: Home Provider: Home Office   I discussed the limitations, risks, security and privacy concerns of performing an evaluation and management service by telephone and the availability of in person appointments. I also discussed with the patient that there may be a patient responsible charge related to this service. The patient expressed understanding and agreed to proceed.   History of Present Illness: Patient is evaluated by phone session.  She is taking Trintellix trazodone and occasionally Ativan.  She denies any panic attack or crying spells.  She has chronic symptoms lately manageable and is stable.  She loves dog walking.  She does not like very hot summer and hoping to have a cooler weather.  She sleeps okay.  She denies any crying spells or any feeling of hopelessness or suicidal thoughts.  Her appetite is okay.  She has chronic tremors and seeing a neurologist at [facility].  Past Psychiatric History: Reviewed. H/O depression and anxiety.  H/O suicidal attempt on Percocet in March 2019.  Took Effexor in her 29s but stopped after working for a while.  Tried Zoloft, Paxil and Wellbutrin from PCP but did not work. Did IOP, PHP and given Prozac but no improvement. Tried lexapro caused GI s/e, Lamictal caused increase tremors.   Psychiatric Specialty Exam: Physical Exam  Review of Systems  Weight 186 lb (84.4 kg), last menstrual period 09/18/1979.There is no height or weight on file to calculate BMI.  General Appearance: NA  Eye Contact:  NA  Speech:  Slow  Volume:  Normal  Mood:  Euthymic  Affect:  NA  Thought Process:  Goal Directed  Orientation:  Full (Time, Place, and Person)  Thought Content:  Logical  Suicidal Thoughts:  No  Homicidal Thoughts:  No  Memory:  Immediate;    Good Recent;   Good Remote;   Fair  Judgement:  Good  Insight:  Present  Psychomotor Activity:  Tremor  Concentration:  Concentration: Fair and Attention Span: Fair  Recall:  Good  Fund of Knowledge:  Good  Language:  Good  Akathisia:  No  Handed:  Right  AIMS (if indicated):     Assets:  Communication Skills Desire for Improvement Housing Transportation  ADL's:  Intact  Cognition:  WNL  Sleep:   7 hrs      Assessment and Plan: Major depressive disorder, recurrent.  Anxiety.  Patient is stable on her current medication and does not want to change the dose.  Continue Trintellix 10 mg daily, trazodone 150 mg at bedtime and Ativan 0.5 mg to take as needed for anxiety.  Recommended to call us back if she has any question or any concern.  Follow-up in 3 months.  Follow Up Instructions:    I discussed the assessment and treatment plan with the patient. The patient was provided an opportunity to ask questions and all were answered. The patient agreed with the plan and demonstrated an understanding of the instructions.   The patient was advised to call back or seek an in-person evaluation if the symptoms worsen or if the condition fails to improve as anticipated.  Collaboration of Care: Other provider involved in patient's care AEB notes are available in epic to review.  Patient/Guardian was advised Release of Information must be obtained prior to any record release in  order to collaborate their care with an outside provider. Patient/Guardian was advised if they have not already done so to contact the registration department to sign all necessary forms in order for [place] to release information regarding their care.   Consent: Patient/Guardian gives verbal consent for treatment and assignment of benefits for services provided during this visit. Patient/Guardian expressed understanding and agreed to proceed.    I provided 17 minutes of non-face-to-face time during this  encounter.   [name], MD

## 2022-05-25 DIAGNOSIS — L089 Local infection of the skin and subcutaneous tissue, unspecified: Secondary | ICD-10-CM | POA: Diagnosis not present

## 2022-05-25 DIAGNOSIS — S61451A Open bite of right hand, initial encounter: Secondary | ICD-10-CM | POA: Diagnosis not present

## 2022-05-25 DIAGNOSIS — W5501XA Bitten by cat, initial encounter: Secondary | ICD-10-CM | POA: Diagnosis not present

## 2022-05-30 DIAGNOSIS — L821 Other seborrheic keratosis: Secondary | ICD-10-CM | POA: Diagnosis not present

## 2022-05-30 DIAGNOSIS — C44629 Squamous cell carcinoma of skin of left upper limb, including shoulder: Secondary | ICD-10-CM | POA: Diagnosis not present

## 2022-05-30 DIAGNOSIS — L820 Inflamed seborrheic keratosis: Secondary | ICD-10-CM | POA: Diagnosis not present

## 2022-05-30 DIAGNOSIS — L603 Nail dystrophy: Secondary | ICD-10-CM | POA: Diagnosis not present

## 2022-05-30 DIAGNOSIS — Z85828 Personal history of other malignant neoplasm of skin: Secondary | ICD-10-CM | POA: Diagnosis not present

## 2022-06-05 ENCOUNTER — Ambulatory Visit: Payer: Medicare Other | Admitting: Neurology

## 2022-06-14 DIAGNOSIS — Z5181 Encounter for therapeutic drug level monitoring: Secondary | ICD-10-CM | POA: Diagnosis not present

## 2022-06-14 DIAGNOSIS — M5136 Other intervertebral disc degeneration, lumbar region: Secondary | ICD-10-CM | POA: Diagnosis not present

## 2022-06-14 DIAGNOSIS — M5416 Radiculopathy, lumbar region: Secondary | ICD-10-CM | POA: Diagnosis not present

## 2022-06-14 DIAGNOSIS — Z599 Problem related to housing and economic circumstances, unspecified: Secondary | ICD-10-CM | POA: Diagnosis not present

## 2022-06-14 DIAGNOSIS — Z79899 Other long term (current) drug therapy: Secondary | ICD-10-CM | POA: Diagnosis not present

## 2022-06-14 DIAGNOSIS — M47896 Other spondylosis, lumbar region: Secondary | ICD-10-CM | POA: Diagnosis not present

## 2022-06-20 ENCOUNTER — Telehealth: Payer: Self-pay | Admitting: *Deleted

## 2022-06-20 NOTE — Patient Outreach (Signed)
  Care Coordination   06/20/2022 Name: ILIZA BLANKENBECKLER MRN: [id number] DOB: 09/20/1945   Care Coordination Outreach Attempts:  A second unsuccessful outreach was attempted today to offer the patient with information about available care coordination services as a benefit of their health plan.     Follow Up Plan:  Additional outreach attempts will be made to offer the patient care coordination information and services.   Encounter Outcome:  No Answer  Care Coordination Interventions Activated:  No   Care Coordination Interventions:  No, not indicated    Raina Mina, RN Care Management Coordinator [place] [facility] [phone]

## 2022-06-27 ENCOUNTER — Ambulatory Visit (INDEPENDENT_AMBULATORY_CARE_PROVIDER_SITE_OTHER): Payer: Medicare Other | Admitting: Neurology

## 2022-06-27 DIAGNOSIS — G43709 Chronic migraine without aura, not intractable, without status migrainosus: Secondary | ICD-10-CM | POA: Diagnosis not present

## 2022-06-27 MED ORDER — PROPRANOLOL HCL ER 120 MG PO CP24: 120.0000 mg | ORAL_CAPSULE | Freq: Every day | ORAL | 3 refills | Status: DC

## 2022-06-27 MED ORDER — NURTEC 75 MG PO TBDP: ORAL_TABLET | ORAL | 5 refills | Status: DC

## 2022-06-27 MED ORDER — ONABOTULINUMTOXINA 200 UNITS IJ SOLR
155.0000 [IU] | Freq: Once | INTRAMUSCULAR | Status: AC
  Administered 2022-06-27: 155 [IU] via INTRAMUSCULAR

## 2022-06-27 NOTE — Progress Notes (Signed)
     BOTOX PROCEDURE NOTE FOR MIGRAINE HEADACHE   HISTORY: Hayley [name] is here today for Botox injection for chronic migraine headache.  Continues to be 90% helpful.  She brings with her a lidocaine spray she wishes to use.  Description of procedure:  The patient was placed in a sitting position. The standard protocol was used for Botox as follows, with 5 units of Botox injected at each site:   -Procerus muscle, midline injection  -Corrugator muscle, bilateral injection  -Frontalis muscle, bilateral injection, with 2 sites each side, medial injection was performed in the upper one third of the frontalis muscle, in the region vertical from the medial inferior edge of the superior orbital rim. The lateral injection was again in the upper one third of the forehead vertically above the lateral limbus of the cornea, 1.5 cm lateral to the medial injection site.  -Temporalis muscle injection, 4 sites, bilaterally. The first injection was 3 cm above the tragus of the ear, second injection site was 1.5 cm to 3 cm up from the first injection site in line with the tragus of the ear. The third injection site was 1.5-3 cm forward between the first 2 injection sites. The fourth injection site was 1.5 cm posterior to the second injection site.  -Occipitalis muscle injection, 3 sites, bilaterally. The first injection was done one half way between the occipital protuberance and the tip of the mastoid process behind the ear. The second injection site was done lateral and superior to the first, 1 fingerbreadth from the first injection. The third injection site was 1 fingerbreadth superiorly and medially from the first injection site.  -Cervical paraspinal muscle injection, 2 sites, bilateral, the first injection site was 1 cm from the midline of the cervical spine, 3 cm inferior to the lower border of the occipital protuberance. The second injection site was 1.5 cm superiorly and laterally to the first injection  site.  -Trapezius muscle injection was performed at 3 sites, bilaterally. The first injection site was in the upper trapezius muscle halfway between the inflection point of the neck, and the acromion. The second injection site was one half way between the acromion and the first injection site. The third injection was done between the first injection site and the inflection point of the neck.   A 200 unit bottle of Botox was used, 155 units were injected, the rest of the Botox was wasted. The patient tolerated the procedure well, there were no complications of the above procedure.  Botox NDC 3559-7416-38 Lot number [id number] Expiration date 10/2024 B/B

## 2022-06-27 NOTE — Progress Notes (Signed)
Patient: Hayley [name] Date of Birth: 13-Sep-1946  Reason for Visit: Follow up History from: Patient Primary Neurologist: [name]   ASSESSMENT AND PLAN 76 y.o. year old female   15.  Essential tremor -Continue Inderal LA 120 mg daily -Seeing [facility], potentially a candidate for DBS  2.  Chronic migraine headaches -Continue Botox therapy, 90% effective -Continue Nurtec as needed for acute headache   HISTORY  Hayley [name] is a 76 years old right-handed female, seen in refer by hand surgeon Dr. Daryll Brod, and her primary care PA Flora Lipps for evaluation of neck pain, radiating pain to bilateral shoulder, upper extremity   She has past medical history of spasmodic dysphonia, status post surgical correction, also had a history of hypertension, diabetes, DVT, PE following surgical procedure, most recent was right knee replacement in March 2016, is taking anticoagulation pradaxa, chronic low back pain, previous history of epidural injection for low back pain.   She fell from law mower in June 2016, landed on her back, she was able to got up, finished mowing her lawn, later that day, she noticed worsening low back pain, significant neck pain, radiating pain to her bilateral shoulder, upper extremity, hands, she also noticed bilateral hands paresthesia, burning sensation, subjective weakness, she had worsening urinary urgency, wear pads occasionally, mild unbalanced gait, mild constipation   She was evaluated by [facility] orthopedic surgeon Dr. Rolena Infante for her worsening low back pain, reported MRI of lumbar spine at [place] in July 2016, was told she is not a surgical candidate, she does has multilevel lumbar degenerative disc disease.   She is also concerned about her bilateral hands tremor, getting worse since 2015, most noticeable when she writes, holding utensils, there was no similar family history   UPDATE August 24th 2016: She continues to complain bilateral neck pain,  radiating pain to bilateral shoulder, arm, We have reviewed MRI cervical spine (without) demonstrating: 1. At C3-4: disc bulging and uncovertebral joint hypertrophy and facet hypertrophy with severe left foraminal stenosis 2. At C4-5: disc bulging and uncovertebral joint hypertrophy with severe biforaminal stenosis 3. At C5-6: disc bulging and uncovertebral joint hypertrophy with moderate right and severe left foraminal stenosis 4. At C6-7: disc bulging and uncovertebral joint hypertrophy with moderate biforaminal stenosis 5. No intrinsic or compressive spinal cord lesions   Propanolol 40 mg twice a day has helped her tremor, she denies significant side effect.   UPDATE June 26th 2017: She continue complains of bilateral lower extremity posturing tremor, significant neck pain, radiating pain to bilateral upper extremity, increased unsteady gait, she has fell few times, worsening bilateral lower extremity paresthesia   UPDATE Sept 1 2017: She has worsening low back pain, it gets worse with any movement, difficulty changing bed linen, she is no longer cooking,    She recently had spinal stimulator trial by [facility] by Dr. Delfino Lovett Rauck,,was deemed to be a potential candidate, she did responding well to the spinal stimulator. She complains of midline constant low back pain, radiating down to right hip, right leg and left leg, getting worse with movement, she has bladder urgency, no bowel incontinence.    She had physical therapy, last in July 2017,     She also complains of migraine, seems to get more frequent since March 2017, she is now having headaches 3-5/week, lasting for 4 hours or longer, with light noise sensitivity nauseous,  She used to get Botox injection as migraine prevention from Dr. Samuel Germany, responded very well,  last injection was in 12-19-2013, responding well. She is taking Imitrex, Zofran as needed   She is on antibiotics for chronic UTI,   Update June 20 2016: She  came in for Botox injection as migraine prevention, also complains worsening bilateral hands tremor, hope to received EMG guided Botox injection for her right hand tremor.   UPDATE Sep 27 2015: She had sudden onset of left shoulder pain, neck pain, I personally reviewed x-ray of the cervical spine, evidence of multilevel degenerative disc disease most severe C5-6 C6-7, mild to moderate bilateral foraminal stenosis   She responded well to previous Botox injection in October 2017, reported 75% improvement, over past 3 months, used Imitrex injection once, 4 tablets of Imitrex 25 mg, works well,   She was not sure about the benefit of EMG guided botulism toxin injection for her right hand tremor,   UPDATE December 19 2016: She complains of frequent headaches since December 16 2016, bilateral occipital region, pressure, pounding, nauseous, light sensitivity, she still complains 7 out of 10 headaches, despite multiple dose of Imitrex 25 mg tablets, and Imitrex injection, she has used up all her supplies, she also noticed increased bilateral hands tremor, feeling electronic sensation underneath her skin, to her shoulder down to her arms and legs, it is out of her control, but she denied anxiety or depression,   She has self adjusted her medication recently, she stopped the Wellbutrin by herself for a while, started about 3 weeks ago, also has run out of propanolol,   Husband also reported that she spent most of time in her bed, not feeling well.   UPDATE January 02 2017: Her headache overall is under good control, return for Botox injection as migraine prevention, also extra injection for her right hand tremor, writer's cramp   UPDATE Sept 19 2018: She responded well to previous injection for migraine prevention but did not help her essential tremor much,   UPDATE Jun 25 2018: Her husband has stroke in Dec 2018, she has gone through significant emotional and financial stress, suffered a significant depression,  attempted suicidal overdose on Percocet in 2017/12/19, she also presented with significant GI symptoms, nausea vomiting abdominal pain, was treated with IV fluid, diagnosed with pancreatitis, was restarted on her antidepression Wellbutrin, trazodone, she is now discharged home, still dealing with significant depression, complains of intermittent diarrhea, stomach pain, lack of energy, She continue have frequent headaches, previous Botox works well for her migraine headaches, wants to continue Botox injection.   I also reviewed her geneSight psychotropic pharmacogenomic test result, that is most related to antipsychotics, antidepression use   UPDATE Jul 17 2018: She continues to have significant headaches, came in for Botox injection,   UPDATE Nov 13 2018: She complains of significant depression today, her husband is in nursing home, suffered a heart attack in January 2020, planning on to call in hospice care,   She complains of worsening bilateral hands tremor, previously tried primidone, propanolol all significant improvement, clonazepam 0.5 mg seems to help her some, I checked it [place] controlled substance registry, there is no history of overuse,   Her migraine overall is under good control with Botox injection, she is having migraine 1-2 times each week,   She also complains of diffuse body achy pain   Update May 25, 2019: Her husband passed away in 2018/12/20, she has gone through a lot of stress, hospital admission on April 05, 2019 for acute respiratory failure with hypoxia, she was considered  to have multifactorial restrictive disease due to chronic basilar scarring of the lung/atelectasis, obstructive sleep apnea, obesity, hypoventilation syndrome, she was discharged home with nighttime oxygen, she was diagnosed with sleep apnea obstructive many years ago, but no longer using her CPAP machine   She continue have intermittent migraine headaches, no longer uses triptan, Fioricet  helps some, previous Botox injection is helpful, especially to alleviated her neck tension   UPDATE Feb 8th 2021: She missed her scheduled Botox injection as migraine prevention, noticed increased migraine headache over past 2 months, every other day, she has to take Tylenol, or Fioricet as needed   UPDATE Nov 14 2020: Today her main concern is worsening tremor   Tremor, he has a long history of gradual onset fairly symmetric bilateral hands action tremor, was diagnosed with essential tremor, gradually getting worse, previously tried beta-blocker, few times EMG guided Botox injection without helping her symptoms,   " it drives me crazy", she described difficulty holding utensils, put on make-up, she lives by herself, still driving, tremor has put major limitation in activities.   She continue taking Fioricet as needed for migraine headaches, it happened 2-3 times each week, usually starting at the neck, stay behind eyes, with mild light noise sensitivity, nauseous, lasting hours,.   She is now taking Fioricect, daily 2-3/weeks, if she can trated it with ibuprofen/Tyelnol, as needed, she does no take those daily. She has not ake themin in 2 weeks.  She seems to get more migraine headaches since November 2022,   She does not want to continue Botox injection as migraine prevention, did help her, but she does not like the needle pain with Botox injection   UPDATE April 6th 2022: Return for Botox injection as migraine prevention,   UPDATE Jan 5th 2022: Botox injection continue to help her headaches, she had to use Nurtec once or twice every week, seems to help her headache some  Update June 27, 2022 SS: Some concern about losing her medicaid, money is limited every month. Under a lot of stress. Botox continues to do well. Is 90% helpful for migraines, does wear off. Takes Nurtec as needed. Continues with tremor to hands, on Inderal LA 120 mg daily, it helps. Has been seen at [facility], may be a  candidate for DBS or other new option/waiting to determine what insurance may do.   REVIEW OF SYSTEMS: Out of a complete 14 system review of symptoms, the patient complains only of the following symptoms, and all other reviewed systems are negative.  See HPI  ALLERGIES: Allergies  Allergen Reactions   Bee Venom Anaphylaxis   Morphine Other (See Comments)    Hallucinations   Morphine And Related    Tape Rash    Rash - only use paper tape   Etodolac Diarrhea and Nausea And Vomiting   Other Itching and Swelling    Bee venom   Penicillins Rash    Has patient had a PCN reaction causing immediate rash, facial/tongue/throat swelling, SOB or lightheadedness with hypotension: Yes Has patient had a PCN reaction causing severe rash involving mucus membranes or skin necrosis: No Has patient had a PCN reaction that required hospitalization No Has patient had a PCN reaction occurring within the last 10 years: No If all of the above answers are "NO", then may proceed with Cephalosporin use.    Poison Ivy Extract  [Poison Ivy Extract] Itching and Swelling   Propoxyphene Nausea And Vomiting   Propoxyphene N-Acetaminophen Nausea And Vomiting  HOME MEDICATIONS: Outpatient Medications Prior to Visit  Medication Sig Dispense Refill   albuterol (PROVENTIL) (2.5 MG/3ML) 0.083% nebulizer solution Take 2.5 mg by nebulization every 4 (four) hours as needed for wheezing or shortness of breath.     apixaban (ELIQUIS) 2.5 MG TABS tablet Take 1 tablet (2.5 mg total) by mouth 2 (two) times daily. 180 tablet 4   clindamycin (CLEOCIN) 300 MG capsule Take 600 mg by mouth once.     diclofenac (FLECTOR) 1.3 % PTCH Place 1 patch onto the skin daily. (Patient taking differently: Place 1 patch onto the skin daily as needed (Joint and muscle pain).) 20 patch 1   HYDROcodone-acetaminophen (NORCO) 10-325 MG tablet Take 1 tablet by mouth every 6 (six) hours.     hydrOXYzine (ATARAX/VISTARIL) 25 MG tablet Take 1 tablet  (25 mg total) by mouth 3 (three) times daily as needed for anxiety, itching or nausea. 30 tablet 0   hyoscyamine (ANASPAZ) 0.125 MG TBDP disintergrating tablet Place 0.125 mg under the tongue every 4 (four) hours as needed for cramping (Disgestion).     ipratropium (ATROVENT) 0.03 % nasal spray Place 2 sprays into both nostrils every 12 (twelve) hours. 30 mL 12   Ipratropium-Albuterol (COMBIVENT IN) Inhale 1 application. into the lungs daily as needed (Wheezing).     levothyroxine (SYNTHROID, LEVOTHROID) 25 MCG tablet Take 25 mcg by mouth every morning.      LORazepam (ATIVAN) 0.5 MG tablet Take 1 tablet (0.5 mg total) by mouth daily as needed for anxiety. 15 tablet 1   mometasone-formoterol (DULERA) 100-5 MCG/ACT AERO Inhale 2 puffs into the lungs 2 (two) times daily as needed for wheezing or shortness of breath. 1 each 6   nystatin (MYCOSTATIN) 100000 UNIT/ML suspension Use as directed in the mouth or throat daily as needed ([place]).     promethazine-dextromethorphan (PROMETHAZINE-DM) 6.25-15 MG/5ML syrup Take 5 mLs by mouth every 6 (six) hours as needed. 118 mL 1   propranolol (INDERAL) 10 MG tablet Take 1 tablet (10 mg total) by mouth 2 (two) times daily as needed. (Patient taking differently: Take 10 mg by mouth 2 (two) times daily as needed. 30 in before going out) 60 tablet 11   simethicone (MYLICON) 80 MG chewable tablet Chew 1 tablet (80 mg total) by mouth 4 (four) times daily as needed for flatulence (belching). 30 tablet 0   tiZANidine (ZANAFLEX) 4 MG tablet Take 4 mg by mouth at bedtime as needed for muscle spasms.     traZODone (DESYREL) 150 MG tablet Take 1 tablet (150 mg total) by mouth at bedtime. 90 tablet 0   valACYclovir (VALTREX) 1000 MG tablet Take 2,000 mg by mouth 2 (two) times daily as needed (Fever blisters). Reported on 01/10/2016  2   vortioxetine HBr (TRINTELLIX) 10 MG TABS tablet Take 1 tablet (10 mg total) by mouth daily. 90 tablet 0   propranolol ER (INDERAL LA) 120 MG 24  hr capsule Take 1 capsule (120 mg total) by mouth daily. 90 capsule 3   Rimegepant Sulfate (NURTEC) 75 MG TBDP Take 1 tab at onset of migraine.  May repeat in 2 hrs, if needed.  Max dose: 2 tabs/day. This is a 30 day prescription. 8 tablet 5   metoCLOPramide (REGLAN) 5 MG tablet Take 5 mg by mouth 3 (three) times daily as needed for nausea.     No facility-administered medications prior to visit.    PAST MEDICAL HISTORY: Past Medical History:  Diagnosis Date   Anemia  Anxiety    Bronchitis    hx of    Clotting disorder ([place]) 02-17-14   S/p appendectomy-developed Pulmonary emboli-tx. warfarin-d/c 1 yr ago.   Colon polyps    Depression    Diabetes mellitus without complication ([place])    "borderline"- oral med   Disorder of vocal cords    spasmotic dysphonia ,02-17-14 has" whispery voice-low tone"   Hyperthyroidism    Kidney stones    Migraines    Nausea Nov 15, 2014   cycle of migraine headaches and nausea   Neuromuscular disorder ([place])    hands and throat"spasmodic dysphonia", tremors - thimb and forefinger    Obesity, Class III, BMI 40-49.9 (morbid obesity) ([place]) 04/27/2010   02-17-14 reports some weight loss- intentional   Osteoarthritis    in back(chronic pain)   Pelvic pain    02-17-14 "states thinks its back related on left groin"   Pulmonary emboli ([facility])    s/p Appendectomy '13 [facility]   Reflux    Right knee pain    Sleep apnea    cpap - settings at 4    Toxic goiter     PAST SURGICAL HISTORY: Past Surgical History:  Procedure Laterality Date   ABDOMINAL HYSTERECTOMY     APPENDECTOMY     3'13 [facility] s/p developed  Pulmonary Emboli   AUGMENTATION MAMMAPLASTY  09/17/1968   BOTOX INJECTION     for migranes   CATARACT EXTRACTION, BILATERAL Bilateral 03/2013    CHOLECYSTECTOMY     COLONOSCOPY WITH PROPOFOL N/A 12/15/2021   Procedure: COLONOSCOPY WITH PROPOFOL;  Surgeon: Carol Ada, MD;  Location: [facility];  Service: Gastroenterology;  Laterality: N/A;   HAND SURGERY   2004   both hands   HEMOSTASIS CLIP PLACEMENT  12/15/2021   Procedure: HEMOSTASIS CLIP PLACEMENT;  Surgeon: Carol Ada, MD;  Location: [facility];  Service: Gastroenterology;;   [place]   right   LAPAROSCOPIC APPENDECTOMY  11/27/2011   Procedure: APPENDECTOMY LAPAROSCOPIC;  Surgeon: Adin Hector, MD;  Location: [facility];  Service: General;  Laterality: N/A;   POLYPECTOMY  12/15/2021   Procedure: POLYPECTOMY;  Surgeon: Carol Ada, MD;  Location: [facility];  Service: Gastroenterology;;   SPINAL CORD STIMULATOR IMPLANT  06/04/2016   Dr. Alfonse Ras at [place] Right 12/06/2014   Procedure: RIGHT TOTAL KNEE ARTHROPLASTY;  Surgeon: Gaynelle Arabian, MD;  Location: [facility];  Service: Orthopedics;  Laterality: Right;   TOTAL KNEE REVISION Left 02/24/2014   Procedure: LEFT TOTAL KNEE REVISION;  Surgeon: Gearlean Alf, MD;  Location: [facility];  Service: Orthopedics;  Laterality: Left;   vocal cord surgery  02-17-14   01-12-14 done in [place] -[facility](s/p selective denervation/reinnervation recurrent laryngeal nerve surgery)   zenkers diverticulum      FAMILY HISTORY: Family History  Problem Relation Age of Onset   Heart disease Mother    Pneumonia Father    Cirrhosis Father    Hypertension Father    Alcoholism Father    Heart attack Paternal Grandfather    Heart attack Maternal Grandfather    Heart attack Paternal Grandmother    Heart attack Maternal Grandmother    Breast cancer Daughter     SOCIAL HISTORY: Social History   Socioeconomic History   Marital status: Widowed    Spouse name: Not on file   Number of children: 5   Years of education: 16   Highest education [name]:  Not on file  Occupational History   Occupation: Retired  Tobacco Use   Smoking status: Former    Packs/day: 0.25    Years: 5.00    Total pack years: 1.25    Types: Cigarettes    Quit date: 01/03/1982    Years since quitting:  40.5   Smokeless tobacco: Never  Vaping Use   Vaping Use: Never used  Substance and Sexual Activity   Alcohol use: No   Drug use: No   Sexual activity: Not Currently  Other Topics Concern   Not on file  Social History Narrative   Lives at home alone at this time.   Right-handed.   2 cups caffeine per day.   Social Determinants of Health   Financial Resource Strain: Medium Risk (01/21/2018)   Overall Financial Resource Strain (CARDIA)    Difficulty of Paying Living Expenses: Somewhat hard  Food Insecurity: Food Insecurity Present (01/21/2018)   Hunger Vital Sign    Worried About Running Out of Food in the Last Year: Sometimes true    Ran Out of Food in the Last Year: Sometimes true  Transportation Needs: Unmet Transportation Needs (01/21/2018)   PRAPARE - Hydrologist (Medical): Yes    Lack of Transportation (Non-Medical): Yes  Physical Activity: Inactive (01/21/2018)   Exercise Vital Sign    Days of Exercise per Week: 0 days    Minutes of Exercise per Session: 0 min  Stress: Stress Concern Present (01/21/2018)   [place]    Feeling of Stress : Very much  Social Connections: Moderately Isolated (01/21/2018)   Social Connection and Isolation Panel [NHANES]    Frequency of Communication with Friends and Family: Once a week    Frequency of Social Gatherings with Friends and Family: Once a week    Attends Religious Services: More than 4 times per year    Active Member of [employer] or Organizations: No    Attends Archivist Meetings: Never    Marital Status: Separated  Intimate Partner Violence: Not At Risk (01/21/2018)   Humiliation, Afraid, Rape, and Kick questionnaire    Fear of Current or Ex-Partner: No    Emotionally Abused: No    Physically Abused: No    Sexually Abused: No   PHYSICAL EXAM  There were no vitals filed for this visit. There is no height or weight on file to  calculate BMI.  Generalized: Well developed, in no acute distress  Neurological examination  Mentation: Alert oriented to time, place, history taking. Follows all commands speech and language fluent Cranial nerve II-XII: Pupils were equal round reactive to light. Extraocular movements were full, visual field were full on confrontational test. Facial sensation and strength were normal.  Head turning and shoulder shrug  were normal and symmetric. Motor: The motor testing reveals 5 over 5 strength of all 4 extremities. Good symmetric motor tone is noted throughout.  Sensory: Sensory testing is intact to soft touch on all 4 extremities. No evidence of extinction is noted.  Coordination: Cerebellar testing reveals good finger-nose-finger and heel-to-shin bilaterally.  Mild tremor with finger-nose-finger. Gait and station: Gait is slightly wide-based, but steady and independent  DIAGNOSTIC DATA (LABS, IMAGING, TESTING) - I reviewed patient records, labs, notes, testing and imaging myself where available.  Lab Results  Component Value Date   WBC 4.7 10/13/2020   HGB 14.3 10/13/2020   HCT 43.1 10/13/2020   MCV 86.5 10/13/2020  PLT 265.0 10/13/2020      Component Value Date/Time   NA 139 04/01/2019 0407   NA 138 12/12/2016 1105   K 3.6 04/01/2019 0407   K 4.6 12/12/2016 1105   CL 102 04/01/2019 0407   CO2 28 04/01/2019 0407   CO2 25 12/12/2016 1105   GLUCOSE 180 (H) 04/01/2019 0407   GLUCOSE 234 (H) 12/12/2016 1105   BUN 7 (L) 04/01/2019 0407   BUN 8.0 12/12/2016 1105   CREATININE 0.71 04/04/2019 0359   CREATININE 0.84 12/25/2017 1217   CREATININE 0.8 12/12/2016 1105   CALCIUM 8.7 (L) 04/01/2019 0407   CALCIUM 9.4 12/12/2016 1105   PROT 6.9 03/31/2019 1611   PROT 7.5 12/12/2016 1105   ALBUMIN 3.7 03/31/2019 1611   ALBUMIN 3.7 12/12/2016 1105   AST 15 03/31/2019 1611   AST 17 12/25/2017 1217   AST 14 12/12/2016 1105   ALT 8 03/31/2019 1611   ALT 14 12/25/2017 1217   ALT 16  12/12/2016 1105   ALKPHOS 54 03/31/2019 1611   ALKPHOS 102 12/12/2016 1105   BILITOT 0.8 03/31/2019 1611   BILITOT 0.3 12/25/2017 1217   BILITOT 0.34 12/12/2016 1105   GFRNONAA >60 04/04/2019 0359   GFRNONAA >60 12/25/2017 1217   GFRAA >60 04/04/2019 0359   GFRAA >60 12/25/2017 1217   Lab Results  Component Value Date   CHOL 159 03/23/2016   HDL 31 (L) 03/23/2016   LDLCALC 93 03/23/2016   TRIG 122 11/17/2017   CHOLHDL 5.1 03/23/2016   Lab Results  Component Value Date   HGBA1C 6.4 (H) 04/02/2019   Lab Results  Component Value Date   MVHQIONG29 528 11/17/2017   Lab Results  Component Value Date   TSH 1.348 11/17/2017    Butler Denmark, AGNP-C, DNP 06/27/2022, 12:24 PM [facility] [address], [place], [place] [address] [phone]

## 2022-07-09 DIAGNOSIS — N952 Postmenopausal atrophic vaginitis: Secondary | ICD-10-CM | POA: Diagnosis not present

## 2022-07-09 DIAGNOSIS — N3946 Mixed incontinence: Secondary | ICD-10-CM | POA: Diagnosis not present

## 2022-07-09 DIAGNOSIS — Z9682 Presence of neurostimulator: Secondary | ICD-10-CM | POA: Diagnosis not present

## 2022-07-09 DIAGNOSIS — R350 Frequency of micturition: Secondary | ICD-10-CM | POA: Diagnosis not present

## 2022-07-09 DIAGNOSIS — N819 Female genital prolapse, unspecified: Secondary | ICD-10-CM | POA: Diagnosis not present

## 2022-07-10 ENCOUNTER — Other Ambulatory Visit (HOSPITAL_COMMUNITY): Payer: Self-pay | Admitting: Psychiatry

## 2022-07-10 DIAGNOSIS — F322 Major depressive disorder, single episode, severe without psychotic features: Secondary | ICD-10-CM

## 2022-07-10 DIAGNOSIS — F419 Anxiety disorder, unspecified: Secondary | ICD-10-CM

## 2022-07-11 DIAGNOSIS — Z23 Encounter for immunization: Secondary | ICD-10-CM | POA: Diagnosis not present

## 2022-08-02 ENCOUNTER — Encounter (HOSPITAL_COMMUNITY): Payer: Self-pay | Admitting: Psychiatry

## 2022-08-02 ENCOUNTER — Telehealth (HOSPITAL_BASED_OUTPATIENT_CLINIC_OR_DEPARTMENT_OTHER): Payer: Medicare Other | Admitting: Psychiatry

## 2022-08-02 DIAGNOSIS — F322 Major depressive disorder, single episode, severe without psychotic features: Secondary | ICD-10-CM | POA: Diagnosis not present

## 2022-08-02 DIAGNOSIS — F419 Anxiety disorder, unspecified: Secondary | ICD-10-CM | POA: Diagnosis not present

## 2022-08-02 MED ORDER — LORAZEPAM 0.5 MG PO TABS: 0.5000 mg | ORAL_TABLET | Freq: Every day | ORAL | 0 refills | Status: DC | PRN

## 2022-08-02 MED ORDER — TRAZODONE HCL 150 MG PO TABS: 150.0000 mg | ORAL_TABLET | Freq: Every day | ORAL | 0 refills | Status: DC

## 2022-08-02 MED ORDER — VORTIOXETINE HBR 10 MG PO TABS: 10.0000 mg | ORAL_TABLET | Freq: Every day | ORAL | 0 refills | Status: DC

## 2022-08-02 NOTE — Progress Notes (Signed)
Virtual Visit via Telephone Note  I connected with CAELIN ROSEN on 08/02/22 at 11:20 AM EST by telephone and verified that I am speaking with the correct person using two identifiers.  Location: Patient: Home Provider: Home Office   I discussed the limitations, risks, security and privacy concerns of performing an evaluation and management service by telephone and the availability of in person appointments. I also discussed with the patient that there may be a patient responsible charge related to this service. The patient expressed understanding and agreed to proceed.   History of Present Illness: Patient is evaluated by phone session.  She reported some sinking movements few weeks ago because of the 40th anniversary of her marriage.  She was thinking about her deceased husband.  She admitted some sadness and dysphoria but now she is feeling okay.  She is also disappointed because not able to see the daughter on Thanksgiving because she and her husband are working.  She has few friends and she is hoping one of them may be able to see on Thanksgiving.  She is trying to be remain active and watching her calorie intake.  She had lost weight as she is trying to lose weight.  Her appetite is fair.  She denies any crying spells or any feeling of hopelessness or worthlessness.  She has chronic tremors and she is seeing neurology.  Patient denies any suicidal thoughts or homicidal thoughts.  She is taking trazodone and Trintellix which she feels working well.  Occasionally she takes Ativan when she feels nervous or have any anxiety.   Past Psychiatric History: Reviewed. H/O depression and anxiety.  H/O suicidal attempt on Percocet in March 2019.  Took Effexor in her 12s but stopped after working for a while.  Tried Zoloft, Paxil and Wellbutrin from PCP but did not work. Did IOP, PHP and given Prozac but no improvement. Tried lexapro caused GI s/e, Lamictal caused increase tremors.   Psychiatric Specialty  Exam: Physical Exam  Review of Systems  Weight 175 lb (79.4 kg), last menstrual period 09/18/1979.There is no height or weight on file to calculate BMI.  General Appearance: NA  Eye Contact:  NA  Speech:  Slow  Volume:  Decreased  Mood:  Dysphoric  Affect:  NA  Thought Process:  Goal Directed  Orientation:  Full (Time, Place, and Person)  Thought Content:  Rumination  Suicidal Thoughts:  No  Homicidal Thoughts:  No  Memory:  Immediate;   Good Recent;   Good Remote;   Fair  Judgement:  Intact  Insight:  Present  Psychomotor Activity:  Tremor  Concentration:  Concentration: Fair and Attention Span: Fair  Recall:  Good  Fund of Knowledge:  Good  Language:  Good  Akathisia:  No  Handed:  Right  AIMS (if indicated):     Assets:  Communication Skills Desire for Improvement Housing Transportation  ADL's:  Intact  Cognition:  WNL  Sleep:   ok      Assessment and Plan: Major depressive disorder, recurrent.  Anxiety.  Discuss situational anxiety and dysphoria due to upcoming holidays and having 40th wedding anniversary and missing her husband.  I offered considering Trintellix higher dose but patient feel it is situational and hoping after the holidays it was subsided.  She promised to give [place] a call back if noticed worsening of symptoms.  She had a good support from her daughter.  I recommend to call us back if she feels worsening of the symptoms.  Continue  trazodone 150 mg at bedtime, Trintellix 10 mg daily and Ativan 0.5 mg as needed for severe anxiety.  Follow-up in 3 months.  Follow Up Instructions:    I discussed the assessment and treatment plan with the patient. The patient was provided an opportunity to ask questions and all were answered. The patient agreed with the plan and demonstrated an understanding of the instructions.   The patient was advised to call back or seek an in-person evaluation if the symptoms worsen or if the condition fails to improve as  anticipated.  Collaboration of Care: Other provider involved in patient's care AEB notes are available in EPIC to review.   Patient/Guardian was advised Release of Information must be obtained prior to any record release in order to collaborate their care with an outside provider. Patient/Guardian was advised if they have not already done so to contact the registration department to sign all necessary forms in order for [place] to release information regarding their care.   Consent: Patient/Guardian gives verbal consent for treatment and assignment of benefits for services provided during this visit. Patient/Guardian expressed understanding and agreed to proceed.    I provided 18 minutes of non-face-to-face time during this encounter.   [name], MD

## 2022-08-06 ENCOUNTER — Telehealth (HOSPITAL_COMMUNITY): Payer: Medicare Other | Admitting: Psychiatry

## 2022-08-07 ENCOUNTER — Other Ambulatory Visit: Payer: Self-pay | Admitting: Pulmonary Disease

## 2022-08-07 DIAGNOSIS — R053 Chronic cough: Secondary | ICD-10-CM

## 2022-08-14 ENCOUNTER — Telehealth: Payer: Self-pay

## 2022-08-14 NOTE — Telephone Encounter (Signed)
A PA for Nurtec 75 MG was started on CMM. It received automatic approval. (Key: [id number]).  Coverage Start Date:07/15/2022 Coverage End Date:08/14/2023

## 2022-09-06 ENCOUNTER — Encounter: Payer: Self-pay | Admitting: *Deleted

## 2022-09-06 ENCOUNTER — Telehealth: Payer: Self-pay | Admitting: *Deleted

## 2022-09-06 NOTE — Patient Outreach (Signed)
  Care Coordination   09/06/2022 Name: Hayley [name] MRN: [id number] DOB: 05-14-1946   Care Coordination Outreach Attempts:  A third unsuccessful outreach was attempted today to offer the patient with information about available care coordination services as a benefit of their health plan.   Follow Up Plan:  No further outreach attempts will be made at this time. We have been unable to contact the patient to offer or enroll patient in care coordination services  Encounter Outcome:  No Answer   Care Coordination Interventions:  No, not indicated    Raina Mina, RN Care Management Coordinator [place] Office [phone]

## 2022-09-06 NOTE — Patient Instructions (Addendum)
Visit Information  Thank you for taking time to visit with me today. Please don't hesitate to contact me if I can be of assistance to you.   Following are the goals we discussed today:   Goals Addressed               This Visit's Progress     Food/Utilities/Transportation/housing issues and verify PCP establishment (pt-stated)        Care Coordination Interventions: Provided education to patient re: Levels of care for assisted living facilities Reviewed medications with patient and discussed adherence with no needed refills Collaborated with [facility] office regarding confirmation of pt's with a provider as requested by patient. Reviewed scheduled/upcoming provider appointments including Current has sufficient transportation however very difficult as pt uses her own care with limited assistance. 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 This referral was given to Daneen Schick to address all above with the outreach. Other servies maybe mentioned and pt prefers consistency. Discussed plans with patient for ongoing care management follow up and provided patient with direct contact information for care management team Screening for signs and symptoms of depression related to chronic disease state  Assessed social determinant of health barriers Educated on care management services and available resources. Pt receptive with appropriate referrals made.  Pt agreed to a follow up call over the next several weeks. Also contacted Dr. Stephanie Acre office and left a confidential voice message with the RN concerning pt's requesting confirmation on the established PCP with this pt. Requested office contact pt directly with confirmation.        Our next appointment is by telephone on 10/04/2022 at 11:00am  Please call the care guide team at  [phone] if you need to cancel or reschedule your appointment.   If you are experiencing a Mental Health or [place] or need someone to talk to, please call the Suicide and Crisis Lifeline: 988  The patient verbalized understanding of instructions, educational materials, and care plan provided today and agreed to receive a mailed copy of patient instructions, educational materials, and care plan.   The patient has been provided with contact information for the care management team and has been advised to call with any health related questions or concerns.    Raina Mina, RN Care Management Coordinator [place] Office [phone]

## 2022-09-06 NOTE — Patient Outreach (Addendum)
  Care Coordination   Initial Visit Note   09/06/2022 Name: Hayley [name] MRN: [id number] DOB: September 26, 1945  Hayley [name] is a 76 y.o. year old female who sees Jonathon Jordan, MD for primary care. I spoke with  [name] by phone today.  What matters to the patients health and wellness today?  Food/Transportation/housing/gas bill/ verify Dr. Stephanie Acre remains her provider.    Goals Addressed               This Visit's Progress     Food/Utilities/Transportation/housing issues and verify PCP establishment (pt-stated)        Care Coordination Interventions: Provided education to patient re: Levels of care for assisted living facilities Reviewed medications with patient and discussed adherence with no needed refills Collaborated with [facility] office regarding confirmation of pt's with a provider as requested by patient. Reviewed scheduled/upcoming provider appointments including Current has sufficient transportation however very difficult as pt uses her own care with limited assistance. 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 This referral was given to Daneen Schick to address all above with the outreach. Other servies maybe mentioned and pt prefers consistency. Discussed plans with patient for ongoing care management follow up and provided patient with direct contact information for care management team Screening for signs and symptoms of depression related to chronic disease state  Assessed social determinant of health barriers Educated on care management services and available resources. Pt receptive with appropriate referrals made.  Pt agreed to a follow up call over the next several weeks. Also contacted Dr. Stephanie Acre office and left a confidential voice message with the RN concerning pt's requesting confirmation on the established  PCP with this pt. Requested office contact pt directly with confirmation.       SDOH assessments and interventions completed:  Yes  SDOH Interventions Today    Flowsheet Row Most Recent Value  SDOH Interventions   Food Insecurity Interventions Other (Comment)  [Referral to [place] Interventions Other (Comment)  [Referral made to SW-Kendra Humble]  Transportation Interventions Other (Comment)  [Referral made to [place] Interventions Other (Comment)  [Referral to SW Tillie Rung Humble)]        Care Coordination Interventions:  Yes, provided   Follow up plan: Follow up call scheduled for 10/04/2022 @ 11:00 AM    Encounter Outcome:  Pt. Visit Completed   Raina Mina, RN Care Management Coordinator [place] Office [phone]

## 2022-09-07 ENCOUNTER — Ambulatory Visit: Payer: Self-pay

## 2022-09-07 NOTE — Patient Outreach (Signed)
  Care Coordination   Follow Up Visit Note   09/07/2022 Name: Hayley [name] MRN: [id number] DOB: 02/17/1946  Hayley [name] is a 76 y.o. year old female who sees Jonathon Jordan, MD for primary care. I spoke with  [name] by phone today.  What matters to the patients health and wellness today?  Resource Education and Linking    Goals Addressed             This Visit's Progress    Care Coordination Activities       Care Coordination Interventions: Contacted the patient to discuss SDoH needs   Determined the patient has recently lost her Medicaid benefits and is having a difficult time with monthly bills Discussed the patients friend bought her groceries this month to allow the patient to cover the cost of her mortgage Education of meal resources including meals on wheels, congregate meal sites, and food pantries. Patient does not qualify for meals on wheels as she is still driving. Patient is not currently interested in congregate meal sites due to cost of gas and risk of germs eating around several people Located a food pantry at a local church that is approximately 0.5 miles from the patients home - mailed contact information and hours the pantry is open to patients home Determined the patient had extensive dental work last year and must pay Care Credit $300 per month which has caused financial hardship Encouraged the patient to contact Care Credit to request a reduction in monthly payment Patient is interested in resources to assist with Care Credit bill owed (totals 9,000) and utility costs Unfortunately, patient is over the income limit to qualify for LIEAP through DSS to assist with utility costs and there are no resources that assist with copay or credit card bills Provided the patient with information on Legal Aid to determine if Medicaid benefits may be re-instated Encouraged the patient to contact her previous caseworker to determine if her medical debt will qualify her for  Medicaid  Reviewed that although the patient does have a pest problem, she is working with a Environmental education officer group for eradication. They have been out x 3 and the infestation has decreased significantly SW will follow up with the patient over the next 3 weeks         SDOH assessments and interventions completed:  Yes  SDOH Interventions Today    Flowsheet Row Most Recent Value  SDOH Interventions   Food Insecurity Interventions Other (Comment)  [Food Pantry Education]  Housing Interventions Other (Comment)  [pt working with a Environmental education officer company]  Utilities Interventions Other (Comment)  [Education on [place] Coordination Interventions:  Yes, provided   Follow up plan: Follow up call scheduled for 09/28/22    Encounter Outcome:  Pt. Visit Completed   Daneen Schick, Arita Miss, CDP Social Worker, Certified Dementia Practitioner [place] Management  Care Coordination [phone]

## 2022-09-07 NOTE — Patient Instructions (Signed)
Visit Information  Thank you for taking time to visit with me today. Please don't hesitate to contact me if I can be of assistance to you.   Following are the goals we discussed today:   Goals Addressed             This Visit's Progress    Care Coordination Activities       Care Coordination Interventions: Contacted the patient to discuss SDoH needs   Determined the patient has recently lost her Medicaid benefits and is having a difficult time with monthly bills Discussed the patients friend bought her groceries this month to allow the patient to cover the cost of her mortgage Education of meal resources including meals on wheels, congregate meal sites, and food pantries. Patient does not qualify for meals on wheels as she is still driving. Patient is not currently interested in congregate meal sites due to cost of gas and risk of germs eating around several people Located a food pantry at a local church that is approximately 0.5 miles from the patients home - mailed contact information and hours the pantry is open to patients home Determined the patient had extensive dental work last year and must pay Care Credit $300 per month which has caused financial hardship Encouraged the patient to contact Care Credit to request a reduction in monthly payment Patient is interested in resources to assist with Care Credit bill owed (totals 9,000) and utility costs Unfortunately, patient is over the income limit to qualify for LIEAP through DSS to assist with utility costs and there are no resources that assist with copay or credit card bills Provided the patient with information on Legal Aid to determine if Medicaid benefits may be re-instated Encouraged the patient to contact her previous caseworker to determine if her medical debt will qualify her for Medicaid  Reviewed that although the patient does have a pest problem, she is working with a Environmental education officer group for eradication. They have been out  x 3 and the infestation has decreased significantly SW will follow up with the patient over the next 3 weeks         Our next appointment is by telephone on 09/28/22 at 11:00 am  Please call the care guide team at [phone] if you need to cancel or reschedule your appointment.   If you are experiencing a Mental Health or [place] or need someone to talk to, please go to [facility] [address], [place] [phone]) call 911  Patient verbalizes understanding of instructions and care plan provided today and agrees to view in [place]. Active MyChart status and patient understanding of how to access instructions and care plan via MyChart confirmed with patient.     Telephone follow up appointment with care management team member scheduled for:09/28/22  Daneen Schick, Arita Miss, CDP Social Worker, Certified Dementia Practitioner [place] Management  Care Coordination [phone]

## 2022-09-19 ENCOUNTER — Other Ambulatory Visit (HOSPITAL_COMMUNITY): Payer: Self-pay | Admitting: Psychiatry

## 2022-09-19 DIAGNOSIS — F322 Major depressive disorder, single episode, severe without psychotic features: Secondary | ICD-10-CM

## 2022-09-19 DIAGNOSIS — F419 Anxiety disorder, unspecified: Secondary | ICD-10-CM

## 2022-09-26 ENCOUNTER — Ambulatory Visit: Payer: Medicare Other | Admitting: Neurology

## 2022-09-26 ENCOUNTER — Telehealth: Payer: Self-pay | Admitting: Neurology

## 2022-09-26 NOTE — Telephone Encounter (Signed)
Pt cancelled appt due to bad weather, unable to get out of driveway.

## 2022-09-28 ENCOUNTER — Telehealth: Payer: Self-pay

## 2022-09-28 NOTE — Patient Outreach (Signed)
  Care Coordination   09/28/2022 Name: Hayley [name] MRN: [id number] DOB: 07-21-1946   Care Coordination Outreach Attempts:  An unsuccessful telephone outreach was attempted for a scheduled appointment today.  Follow Up Plan:  Additional outreach attempts will be made to offer the patient care coordination information and services.   Encounter Outcome:  No Answer   Care Coordination Interventions:  No, not indicated    Daneen Schick, BSW, CDP Social Worker, Certified Dementia Practitioner [place] Management  Care Coordination [phone]

## 2022-10-02 ENCOUNTER — Telehealth: Payer: Self-pay

## 2022-10-02 NOTE — Patient Outreach (Signed)
  Care Coordination   10/02/2022 Name: Hayley [name] MRN: [id number] DOB: August 12, 1946   Care Coordination Outreach Attempts:  A second unsuccessful outreach was attempted today to offer the patient with information about available care coordination services as a benefit of their health plan.     Follow Up Plan:  Additional outreach attempts will be made to offer the patient care coordination information and services.   Encounter Outcome:  No Answer   Care Coordination Interventions:  No, not indicated    Daneen Schick, BSW, CDP Social Worker, Certified Dementia Practitioner [place] Management  Care Coordination [phone]

## 2022-10-04 ENCOUNTER — Ambulatory Visit: Payer: Self-pay | Admitting: *Deleted

## 2022-10-04 NOTE — Patient Outreach (Signed)
  Care Coordination   10/04/2022 Name: HEENA WOODBURY MRN: [id number] DOB: [birth year]   Care Coordination Outreach Attempts:  An unsuccessful telephone outreach was attempted for a scheduled appointment today.  Follow Up Plan:  Additional outreach attempts will be made to offer the patient care coordination information and services.   Encounter Outcome:  No Answer   Care Coordination Interventions:  No, not indicated    Raina Mina, RN Care Management Coordinator [place] Office [phone]

## 2022-10-08 ENCOUNTER — Ambulatory Visit: Payer: Self-pay

## 2022-10-08 ENCOUNTER — Telehealth: Payer: Self-pay | Admitting: Pulmonary Disease

## 2022-10-08 NOTE — Patient Instructions (Signed)
Visit Information  Thank you for taking time to visit with me today. Please don't hesitate to contact me if I can be of assistance to you.   Following are the goals we discussed today:   Goals Addressed             This Visit's Progress    Care Coordination Activities       Care Coordination Interventions: Confirmed receipt of mailed resource   Determined the patient has gone to DSS to apply for programs and was advised she does not qualify for any assistance programs due to income Patient reports she is in bed not feeling well- reports symptoms of headache, malaise, cough with pain in chest, and sore throat. Patient denies fever or shortness of breath Encouraged the patient to contact her provider for an appointment - patient declined Encouraged the patient to contact her daughter to check on her as needed - patient stated she will call her daughter as needed Advised the patient to call 911 if she begins to feel short of breath Collaboration with [place] Raina Mina to advise of interventions and plan  SW will call to check on patient later this week         Our next appointment is by telephone on 1/25  Please call the care guide team at [phone] if you need to cancel or reschedule your appointment.   If you are experiencing a Mental Health or [place] or need someone to talk to, please call 911  Patient verbalizes understanding of instructions and care plan provided today and agrees to view in [place]. Active MyChart status and patient understanding of how to access instructions and care plan via MyChart confirmed with patient.     Telephone follow up appointment with care management team member scheduled for:1/25  Daneen Schick, Arita Miss, CDP Social Worker, Certified Dementia Practitioner [place] Management  Care Coordination [phone]

## 2022-10-08 NOTE — Telephone Encounter (Signed)
[facility] Telephone Call Overnight  She reports headache, cough, shortness of breath, fever, and diarrhea x 2-3 days over the weekend. She wears oxygen at baseline at night. Has put it on during the day. She checked her oxygen on room air with reading at 83-86%. With 2.5L O2, her saturations are now 93%  Advised to present to the ED for worsening oxygen requirements. She is isolated at her home and has difficulty moving within her home. Her local pharmacies are closed right now.  Patient declines and wishes to wait to see if she can last the night. Advised against this. If patient decides to stay home then she will need to contact office in am for appointment. She expressed understanding of the risks of staying at home and expressed appreciation for the call.

## 2022-10-08 NOTE — Patient Outreach (Signed)
  Care Coordination   Follow Up Visit Note   10/08/2022 Name: Hayley [name] MRN: [id number] DOB: 04/03/1946  Hayley [name] is a 77 y.o. year old female who sees Jonathon Jordan, MD for primary care. I spoke with  [name] by phone today.  What matters to the patients health and wellness today?  I would like to feel better    Goals Addressed             This Visit's Progress    Care Coordination Activities       Care Coordination Interventions: Confirmed receipt of mailed resource   Determined the patient has gone to DSS to apply for programs and was advised she does not qualify for any assistance programs due to income Patient reports she is in bed not feeling well- reports symptoms of headache, malaise, cough with pain in chest, and sore throat. Patient denies fever or shortness of breath Encouraged the patient to contact her provider for an appointment - patient declined Encouraged the patient to contact her daughter to check on her as needed - patient stated she will call her daughter as needed Advised the patient to call 911 if she begins to feel short of breath Collaboration with [place] Raina Mina to advise of interventions and plan  SW will call to check on patient later this week         SDOH assessments and interventions completed:  No     Care Coordination Interventions:  Yes, provided   Follow up plan: Follow up call scheduled for 1/25    Encounter Outcome:  Pt. Visit Completed   Daneen Schick, Arita Miss, CDP Social Worker, Certified Dementia Practitioner [place] Management  Care Coordination [phone]

## 2022-10-09 ENCOUNTER — Telehealth: Payer: Self-pay | Admitting: Pulmonary Disease

## 2022-10-09 DIAGNOSIS — U071 COVID-19: Secondary | ICD-10-CM

## 2022-10-09 MED ORDER — MOLNUPIRAVIR 200 MG PO CAPS: 4.0000 | ORAL_CAPSULE | Freq: Two times a day (BID) | ORAL | 0 refills | Status: AC

## 2022-10-09 MED ORDER — PREDNISONE 10 MG PO TABS: ORAL_TABLET | ORAL | 0 refills | Status: AC

## 2022-10-09 NOTE — Telephone Encounter (Signed)
Called Pt to let her know that Dr Erin Fulling sent her in a steroid taper and an anti viral medication to [facility] [place] and if her symptoms worsen she needs to go to the ER.  Pt stated understanding and nothing further needed.

## 2022-10-09 NOTE — Telephone Encounter (Signed)
Given patient is having oxygen desaturations on room air which is new for her since she is only on oxygen at night, she should be seen in clinic today if possible, if we have no openings she should be seen by her PCP/urgent care/ER. Sounds like she should be tested for covid given her symptoms. She would benefit from antiviral therapy if positive.   Thanks, JD

## 2022-10-09 NOTE — Telephone Encounter (Signed)
PT calling in again this AM. States she had a very rough night. Wonders if we can call something in for her. Pls see notes by Dr. Loanne Drilling below for symptoms. [facility] but I can not understand the city/streets.  [phone]

## 2022-10-09 NOTE — Telephone Encounter (Signed)
I have sent in extended steroid taper and Molnupiravir antiviral medication. Unable to do paxlovid due to the eliquis she is on.   If she worsens, would recommend she go to the ER for further evaluation given her low oxygen levels reported overnight.   JD

## 2022-10-09 NOTE — Telephone Encounter (Signed)
Called and spoke with pt in regards to her call overnight when she called the answering service. Asked pt if she had taken a covid test and she said she had not. Stated to pt that we needed her to do a covid test and to then call us with the results.    No appointments available for today. Routing to Dr. Erin Fulling while we wait to hear from pt of the covid test results to see if he has any recs for pt. Dr. Erin Fulling, please see comment from Dr. Loanne Drilling and advise on recs for pt.

## 2022-10-09 NOTE — Telephone Encounter (Signed)
Called and spoke with pt letting her know recs per Dr. Erin Fulling and she verbalized understanding. Nothing further needed.

## 2022-10-10 ENCOUNTER — Other Ambulatory Visit: Payer: Self-pay

## 2022-10-10 ENCOUNTER — Emergency Department (HOSPITAL_COMMUNITY)
Admission: EM | Admit: 2022-10-10 | Discharge: 2022-10-11 | Disposition: A | Discharge status: Home / Self Care | Payer: Medicare Other | Attending: Emergency Medicine | Admitting: Emergency Medicine

## 2022-10-10 ENCOUNTER — Emergency Department (HOSPITAL_COMMUNITY): Payer: Medicare Other

## 2022-10-10 DIAGNOSIS — E119 Type 2 diabetes mellitus without complications: Secondary | ICD-10-CM | POA: Insufficient documentation

## 2022-10-10 DIAGNOSIS — U071 COVID-19: Secondary | ICD-10-CM | POA: Insufficient documentation

## 2022-10-10 DIAGNOSIS — E876 Hypokalemia: Secondary | ICD-10-CM | POA: Insufficient documentation

## 2022-10-10 DIAGNOSIS — R0902 Hypoxemia: Secondary | ICD-10-CM | POA: Diagnosis not present

## 2022-10-10 DIAGNOSIS — Z7901 Long term (current) use of anticoagulants: Secondary | ICD-10-CM | POA: Insufficient documentation

## 2022-10-10 DIAGNOSIS — R000 Tachycardia, unspecified: Secondary | ICD-10-CM | POA: Insufficient documentation

## 2022-10-10 DIAGNOSIS — R0602 Shortness of breath: Secondary | ICD-10-CM | POA: Diagnosis not present

## 2022-10-10 DIAGNOSIS — R110 Nausea: Secondary | ICD-10-CM | POA: Diagnosis not present

## 2022-10-10 DIAGNOSIS — R112 Nausea with vomiting, unspecified: Secondary | ICD-10-CM | POA: Diagnosis not present

## 2022-10-10 DIAGNOSIS — R1111 Vomiting without nausea: Secondary | ICD-10-CM | POA: Diagnosis not present

## 2022-10-10 LAB — COMPREHENSIVE METABOLIC PANEL
ALT: 7 U/L (ref 0–44)
AST: 18 U/L (ref 15–41)
Albumin: 2.7 g/dL — ABNORMAL LOW (ref 3.5–5.0)
Alkaline Phosphatase: 30 U/L — ABNORMAL LOW (ref 38–126)
Anion gap: 7 (ref 5–15)
BUN: 9 mg/dL (ref 8–23)
CO2: 24 mmol/L (ref 22–32)
Calcium: 6.7 mg/dL — ABNORMAL LOW (ref 8.9–10.3)
Chloride: 106 mmol/L (ref 98–111)
Creatinine, Ser: 0.65 mg/dL (ref 0.44–1.00)
GFR, Estimated: >60 mL/min (ref >60)
Glucose, Bld: 85 mg/dL (ref 70–99)
Potassium: 2.9 mmol/L — ABNORMAL LOW (ref 3.5–5.1)
Sodium: 137 mmol/L (ref 135–145)
Total Bilirubin: 0.5 mg/dL (ref 0.3–1.2)
Total Protein: 5.4 g/dL — ABNORMAL LOW (ref 6.5–8.1)

## 2022-10-10 LAB — CBC
HCT: 34.2 % — ABNORMAL LOW (ref 36.0–46.0)
Hemoglobin: 10.5 g/dL — ABNORMAL LOW (ref 12.0–15.0)
MCH: 25.5 pg — ABNORMAL LOW (ref 26.0–34.0)
MCHC: 30.7 g/dL (ref 30.0–36.0)
MCV: 83 fL (ref 80.0–100.0)
Platelets: 190 10*3/uL (ref 150–400)
RBC: 4.12 MIL/uL (ref 3.87–5.11)
RDW: 14.3 % (ref 11.5–15.5)
WBC: 4.3 10*3/uL (ref 4.0–10.5)
nRBC: 0 % (ref 0.0–0.2)

## 2022-10-10 LAB — RESP PANEL BY RT-PCR (RSV, FLU A&B, COVID)  RVPGX2
Influenza A by PCR: NEGATIVE
Influenza B by PCR: NEGATIVE
Resp Syncytial Virus by PCR: NEGATIVE
SARS Coronavirus 2 by RT PCR: POSITIVE — AB

## 2022-10-10 MED ORDER — ACETAMINOPHEN 500 MG PO TABS
1000.0000 mg | ORAL_TABLET | Freq: Once | ORAL | Status: AC
  Administered 2022-10-10: 1000 mg via ORAL
  Filled 2022-10-10: qty 2

## 2022-10-10 MED ORDER — LACTATED RINGERS IV BOLUS
500.0000 mL | Freq: Once | INTRAVENOUS | Status: AC
  Administered 2022-10-10: 500 mL via INTRAVENOUS

## 2022-10-10 MED ORDER — METOCLOPRAMIDE HCL 5 MG/ML IJ SOLN
10.0000 mg | Freq: Once | INTRAMUSCULAR | Status: AC
  Administered 2022-10-10: 10 mg via INTRAVENOUS
  Filled 2022-10-10: qty 2

## 2022-10-10 MED ORDER — POTASSIUM CHLORIDE 10 MEQ/100ML IV SOLN
10.0000 meq | Freq: Once | INTRAVENOUS | Status: AC
  Administered 2022-10-10: 10 meq via INTRAVENOUS
  Filled 2022-10-10: qty 100

## 2022-10-10 NOTE — ED Notes (Signed)
Ptar consulted for pt transport

## 2022-10-10 NOTE — ED Triage Notes (Signed)
Pt BIBA from home. Pt c/o HA, nausea, and productive cough since Monday. Tested positive at home for COVID Monday. Pt has been unable to keep fluids/food,/meds down.  Aox4  On 2L [place] at baseline- satting at 87%, placed on 4L up to 94%  Given '5mg'$  albuterol, 500 cc NS   BP: 120/56 HR: 100

## 2022-10-10 NOTE — Discharge Instructions (Signed)
You are today for COVID today and your potassium was a little bit low but everything else looked okay.  No pneumonia.  Use the oxygen as needed over the next few days to week while you are getting over COVID.  I would stop the COVID medicine as it may be causing you to have vomiting.  Make sure you are resting and staying hydrated and getting some calories in.  You might have to force herself to eat.  You can also take Tylenol extra strength for the body aches and pains.  If you start feeling worse, unable to get out of bed, cannot catch her breath even with oxygen return to the emergency room.

## 2022-10-10 NOTE — ED Provider Notes (Signed)
[name] EMERGENCY DEPARTMENT AT [facility] Provider Note   CSN: [id number] Arrival date & time: 10/10/22  1719     History  Chief Complaint  Patient presents with   Covid Positive    Hayley [name] is a 77 y.o. female.  Patient is a 77 year old female with a history of diabetes, prior PE, hyperthyroidism, anemia, lung pathology that requires her to wear 2-1/2 L of oxygen at night who is presenting today with a 3-day history of worsening generalized malaise, myalgia, cough, shortness of breath.  Patient reports symptoms started on Monday she called her pulmonologist and they recommended she do a home COVID test which she reports tested positive.  She talked with them yesterday and they called in a prescription for molnupiravir because she was unable to take Paxlovid.  She reports that she took 2 doses but this morning woke up with recurrent vomiting and diarrhea.  She reports that she has not been able to eat anything since Sunday she feels globally weak and very short of breath.  When EMS arrived patient's oxygen saturation was 87%.  She reports she never wears oxygen during the day and uses inhalers as needed.  She was given albuterol and route but reports she did not think it really helped much.  She continues to take Eliquis daily and has continued with her home medications.  She denies any localized chest or abdominal pain but is having a pretty bad headache.  She does live alone and reports she has been very weak today and had a hard time getting up.  The history is provided by the patient, the EMS personnel and medical records.       Home Medications Prior to Admission medications   Medication Sig Start Date End Date Taking? Authorizing Provider  albuterol (PROVENTIL) (2.5 MG/3ML) 0.083% nebulizer solution Take 2.5 mg by nebulization every 4 (four) hours as needed for wheezing or shortness of breath. 07/15/20   [provider]  apixaban (ELIQUIS) 2.5 MG TABS  tablet Take 1 tablet (2.5 mg total) by mouth 2 (two) times daily. 06/01/19   Magrinat, Virgie Dad, MD  clindamycin (CLEOCIN) 300 MG capsule Take 600 mg by mouth once.    [provider]  diclofenac (FLECTOR) 1.3 % PTCH Place 1 patch onto the skin daily. Patient taking differently: Place 1 patch onto the skin daily as needed (Joint and muscle pain). 04/05/19   Guilford Shi, MD  HYDROcodone-acetaminophen (NORCO) 10-325 MG tablet Take 1 tablet by mouth every 6 (six) hours. 11/23/21   [provider]  hydrOXYzine (ATARAX/VISTARIL) 25 MG tablet Take 1 tablet (25 mg total) by mouth 3 (three) times daily as needed for anxiety, itching or nausea. 04/05/19   [place] Shi, MD  hyoscyamine (ANASPAZ) 0.125 MG TBDP disintergrating tablet Place 0.125 mg under the tongue every 4 (four) hours as needed for cramping (Disgestion).    [provider]  ipratropium (ATROVENT) 0.03 % nasal spray Place 2 sprays into both nostrils every 12 (twelve) hours. 03/07/22   Freddi Starr, MD  Ipratropium-Albuterol (COMBIVENT IN) Inhale 1 application. into the lungs daily as needed (Wheezing).    [provider]  levothyroxine (SYNTHROID, LEVOTHROID) 25 MCG tablet Take 25 mcg by mouth every morning.     [provider]  LORazepam (ATIVAN) 0.5 MG tablet Take 1 tablet (0.5 mg total) by mouth daily as needed for anxiety. 08/02/22 08/02/23  Arfeen, [name], MD  molnupiravir EUA (LAGEVRIO) 200 MG CAPS capsule Take  4 capsules (800 mg total) by mouth 2 (two) times daily for 5 days. 10/09/22 10/14/22  Freddi Starr, MD  mometasone-formoterol (DULERA) 100-5 MCG/ACT AERO Inhale 2 puffs into the lungs 2 (two) times daily as needed for wheezing or shortness of breath. 03/07/22   Freddi Starr, MD  nystatin (MYCOSTATIN) 100000 UNIT/ML suspension Use as directed in the mouth or throat daily as needed (Thrush). 11/27/21   [provider]  predniSONE (DELTASONE) 10 MG tablet Take 4  tablets (40 mg total) by mouth daily with breakfast for 3 days, THEN 3 tablets (30 mg total) daily with breakfast for 3 days, THEN 2 tablets (20 mg total) daily with breakfast for 3 days, THEN 1 tablet (10 mg total) daily with breakfast for 3 days. 10/09/22 10/20/22  Freddi Starr, MD  promethazine-dextromethorphan (PROMETHAZINE-DM) 6.25-15 MG/5ML syrup TAKE 5 MILLILITERS BY MOUTH EVERY 6 HOURS AS NEEDED 08/08/22   Freddi Starr, MD  propranolol (INDERAL) 10 MG tablet Take 1 tablet (10 mg total) by mouth 2 (two) times daily as needed. Patient taking differently: Take 10 mg by mouth 2 (two) times daily as needed. 30 in before going out 09/20/21   Marcial Pacas, MD  propranolol ER (INDERAL LA) 120 MG 24 hr capsule Take 1 capsule (120 mg total) by mouth daily. 06/27/22   Suzzanne Cloud, NP  Rimegepant Sulfate (NURTEC) 75 MG TBDP Take 1 tab at onset of migraine.  May repeat in 2 hrs, if needed.  Max dose: 2 tabs/day. This is a 30 day prescription. 06/27/22   Suzzanne Cloud, NP  simethicone (MYLICON) 80 MG chewable tablet Chew 1 tablet (80 mg total) by mouth 4 (four) times daily as needed for flatulence (belching). 04/05/19   Guilford Shi, MD  tiZANidine (ZANAFLEX) 4 MG tablet Take 4 mg by mouth at bedtime as needed for muscle spasms. 11/23/21   [provider]  traZODone (DESYREL) 150 MG tablet Take 1 tablet (150 mg total) by mouth at bedtime. 08/02/22   Arfeen, [name], MD  valACYclovir (VALTREX) 1000 MG tablet Take 2,000 mg by mouth 2 (two) times daily as needed (Fever blisters). Reported on 01/10/2016 10/14/14   [provider]  vortioxetine HBr (TRINTELLIX) 10 MG TABS tablet Take 1 tablet (10 mg total) by mouth daily. 08/02/22   Arfeen, [name], MD      Allergies    Bee venom, Morphine, Morphine and related, Tape, Etodolac, Other, Penicillins, Poison ivy extract  [poison ivy extract], Propoxyphene, and Propoxyphene n-acetaminophen    Review of Systems   Review of  Systems  Physical Exam Updated Vital Signs BP 131/77   Pulse 85   Temp 98.7 F (37.1 C) (Oral)   Resp 14   Ht '5\' 5"'$  (1.651 m)   Wt 79.4 kg   LMP 09/18/1979 (Approximate)   SpO2 95%   BMI 29.12 kg/m  Physical Exam Vitals and nursing note reviewed.  Constitutional:      General: She is not in acute distress.    Appearance: She is well-developed.  HENT:     Head: Normocephalic and atraumatic.     Nose: Congestion present.     Mouth/Throat:     Mouth: Mucous membranes are dry.     Pharynx: No oropharyngeal exudate.  Eyes:     Pupils: Pupils are equal, round, and reactive to light.  Cardiovascular:     Rate and Rhythm: Regular rhythm. Tachycardia present.     Heart sounds: Normal heart sounds.  No murmur heard.    No friction rub.  Pulmonary:     Effort: Pulmonary effort is normal. Tachypnea present.     Breath sounds: Normal breath sounds. Decreased air movement present. No wheezing or rales.  Abdominal:     General: Bowel sounds are normal. There is no distension.     Palpations: Abdomen is soft.     Tenderness: There is no abdominal tenderness. There is no guarding or rebound.  Musculoskeletal:        General: No tenderness. Normal range of motion.     Right lower leg: No edema.     Left lower leg: No edema.     Comments: No edema  Skin:    General: Skin is warm and dry.     Capillary Refill: Capillary refill takes 2 to 3 seconds.     Findings: No rash.     Comments: Poor skin turgor  Neurological:     Mental Status: She is alert and oriented to person, place, and time. Mental status is at baseline.     Cranial Nerves: No cranial nerve deficit.  Psychiatric:        Behavior: Behavior normal.     ED Results / Procedures / Treatments   Labs (all labs ordered are listed, but only abnormal results are displayed) Labs Reviewed  RESP PANEL BY RT-PCR (RSV, FLU A&B, COVID)  RVPGX2 - Abnormal; Notable for the following components:      Result Value   SARS  Coronavirus 2 by RT PCR POSITIVE (*)    All other components within normal limits  CBC - Abnormal; Notable for the following components:   Hemoglobin 10.5 (*)    HCT 34.2 (*)    MCH 25.5 (*)    All other components within normal limits  COMPREHENSIVE METABOLIC PANEL - Abnormal; Notable for the following components:   Potassium 2.9 (*)    Calcium 6.7 (*)    Total Protein 5.4 (*)    Albumin 2.7 (*)    Alkaline Phosphatase 30 (*)    All other components within normal limits    EKG None  Radiology DG Chest Portable 1 View  Result Date: 10/10/2022 CLINICAL DATA:  Shortness of breath. EXAM: PORTABLE CHEST 1 VIEW COMPARISON:  10/13/2020 FINDINGS: The cardiac silhouette, mediastinal hilar contours are within normal limits and stable. Streaky basilar scarring changes but no definite infiltrates or effusions. No pneumothorax. The bony thorax is intact. IMPRESSION: Streaky basilar scarring changes but no definite infiltrates or effusions. Electronically Signed   By: Marijo Sanes M.D.   On: 10/10/2022 18:49    Procedures Procedures    Medications Ordered in ED Medications  lactated ringers bolus 500 mL (0 mLs Intravenous Stopped 10/10/22 2317)  metoCLOPramide (REGLAN) injection 10 mg (10 mg Intravenous Given 10/10/22 1817)  acetaminophen (TYLENOL) tablet 1,000 mg (1,000 mg Oral Given 10/10/22 2328)  potassium chloride 10 mEq in 100 mL IVPB (0 mEq Intravenous Stopped 10/10/22 2059)    ED Course/ Medical Decision Making/ A&P                             Medical Decision Making Amount and/or Complexity of Data Reviewed Labs: ordered. Decision-making details documented in ED Course. Radiology: ordered and independent interpretation performed. Decision-making details documented in ED Course.  Risk OTC drugs. Prescription drug management.   Pt with multiple medical problems and comorbidities and presenting today with a complaint that caries a  high risk for morbidity and mortality.  Pt  with symptoms consistent with viral URI vs PNA vs electrolyte abnormalities.  No signs of breathing difficulty but EMS noted wheezing and hypoxia on there arrival and placed pt on 4L of O2 and gave albuterol.  No signs of pharyngitis, otitis or abnormal abdominal findings.   Patient reports she did test COVID-positive on a home test but we do not have any record of that.  Labs and x-ray are pending.  Patient given IV fluids, nausea medication and Tylenol for headache.  11:48 PM I independently interpreted patient's labs and COVID is positive, CBC with stable white count of 4.3, hemoglobin of 10.5 which is not significantly changed and CMP with hypokalemia of 2.9 and low calcium of 6.7 but otherwise normal.  After IV fluids patient does appear to feel better.  Discussed with her discontinuing molnupiravir due to the concern it may be causing nausea and vomiting.  Will ensure patient can tolerate p.o.'s.  She does have oxygen at home to use as needed.  Will ambulate the patient with the oxygen to see how she does to see if she is a candidate for going home.  I have independently visualized and interpreted pt's images today.  Chest x-ray without acute findings.  11:48 PM Pt tolerating po's.  Sats and VS stable.  Pt does have O2 at home and on O2 pt was able ot ambulate and sats remained normal.  Discussed with pt and offered admission but at time she feels comfortable going home.  She was given return precautions.          Final Clinical Impression(s) / ED Diagnoses Final diagnoses:  COVID  Hypokalemia    Rx / DC Orders ED Discharge Orders     None         Blanchie Dessert, MD 10/10/22 2348

## 2022-10-11 ENCOUNTER — Telehealth: Payer: Self-pay

## 2022-10-11 DIAGNOSIS — R5383 Other fatigue: Secondary | ICD-10-CM | POA: Diagnosis not present

## 2022-10-11 DIAGNOSIS — Z7401 Bed confinement status: Secondary | ICD-10-CM | POA: Diagnosis not present

## 2022-10-11 NOTE — Patient Outreach (Signed)
  Care Coordination   10/11/2022 Name: Hayley [name] MRN: [id number] DOB: Jul 15, 1946   Care Coordination Outreach Attempts:  An unsuccessful telephone outreach was attempted for a scheduled appointment today. SW performed chart review to note patient seen in ED on 1/24. Collaboration with RN Care Manager to advise of recent ED visit.  Follow Up Plan:  Additional outreach attempts will be made to offer the patient care coordination information and services.   Encounter Outcome:  No Answer   Care Coordination Interventions:  No, not indicated    Daneen Schick, BSW, CDP Social Worker, Certified Dementia Practitioner [place] [facility]  Care Coordination [phone]

## 2022-10-12 ENCOUNTER — Ambulatory Visit: Payer: Self-pay

## 2022-10-12 NOTE — Patient Instructions (Signed)
Visit Information  Thank you for taking time to visit with me today. Please don't hesitate to contact me if I can be of assistance to you.   Following are the goals we discussed today:   Goals Addressed             This Visit's Progress    Care Coordination Activities       Care Coordination Interventions: Inbound call received from the patient who indicates she did go to the ED earlier this week and was diagnosed with Covid Discussed the patient is home resting; fever went up to 102 this morning. Patient is feeling better today than she did earlier in the week Advised the patient to contact her primary care providers office as needed or dial 911 if she experiences shortness of breath Scheduled follow up call to the patient over the next 5 days         If you are experiencing a Mental Health or [place] or need someone to talk to, please call 911  Patient verbalizes understanding of instructions and care plan provided today and agrees to view in [place]. Active MyChart status and patient understanding of how to access instructions and care plan via MyChart confirmed with patient.     Daneen Schick, BSW, CDP Social Worker, Certified Dementia Practitioner [place] [facility]  Care Coordination [phone]

## 2022-10-12 NOTE — Patient Outreach (Signed)
  Care Coordination   Follow Up Visit Note   10/12/2022 Name: Hayley [name] MRN: [id number] DOB: Jan 29, 1946  Hayley [name] is a 77 y.o. year old female who sees Jonathon Jordan, MD for primary care. I spoke with  [name] by phone today.  What matters to the patients health and wellness today?  To recover from Covid    Goals Addressed             This Visit's Progress    Care Coordination Activities       Care Coordination Interventions: Inbound call received from the patient who indicates she did go to the ED earlier this week and was diagnosed with Covid Discussed the patient is home resting; fever went up to 102 this morning. Patient is feeling better today than she did earlier in the week Advised the patient to contact her primary care providers office as needed or dial 911 if she experiences shortness of breath Scheduled follow up call to the patient over the next 5 days        SDOH assessments and interventions completed:  No     Care Coordination Interventions:  Yes, provided   Follow up plan:  SW will continue to follow    Encounter Outcome:  Pt. Visit Completed   Daneen Schick, Arita Miss, CDP Social Worker, Certified Dementia Practitioner [facility] Management  Care Coordination [phone]

## 2022-10-15 ENCOUNTER — Ambulatory Visit: Payer: Self-pay | Admitting: *Deleted

## 2022-10-15 NOTE — Patient Outreach (Signed)
  Care Coordination   10/15/2022 Name: AUBREANNA PERCLE MRN: [id number] DOB: [birth year]   Care Coordination Outreach Attempts:  An unsuccessful telephone outreach was attempted for a scheduled appointment today.  Follow Up Plan:  Additional outreach attempts will be made to offer the patient care coordination information and services.   Encounter Outcome:  No Answer   Care Coordination Interventions:  No, not indicated    Raina Mina, RN Care Management Coordinator [place] Office [phone]

## 2022-10-18 ENCOUNTER — Telehealth: Payer: Self-pay

## 2022-10-18 NOTE — Patient Outreach (Signed)
  Care Coordination   10/18/2022 Name: Hayley [name] MRN: [id number] DOB: 04/04/1946   Care Coordination Outreach Attempts:  An unsuccessful telephone outreach was attempted for a scheduled appointment today.  Follow Up Plan:  Additional outreach attempts will be made to offer the patient care coordination information and services.   Encounter Outcome:  No Answer   Care Coordination Interventions:  No, not indicated    Daneen Schick, BSW, CDP Social Worker, Certified Dementia Practitioner [place] [facility]  Care Coordination [phone]

## 2022-10-19 ENCOUNTER — Ambulatory Visit: Payer: Self-pay | Admitting: *Deleted

## 2022-10-19 NOTE — Patient Outreach (Signed)
  Care Coordination   10/19/2022 Name: Hayley [name] MRN: [id number] DOB: 1945-11-30   Care Coordination Outreach Attempts:  An unsuccessful telephone outreach was attempted for a scheduled appointment today.  Follow Up Plan:  Additional outreach attempts will be made to offer the patient care coordination information and services.   Encounter Outcome:  No Answer   Care Coordination Interventions:  No, not indicated    Raina Mina, RN Care Management Coordinator [place] Office [phone]

## 2022-10-22 ENCOUNTER — Telehealth: Payer: Self-pay

## 2022-10-22 NOTE — Patient Outreach (Signed)
  Care Coordination   10/22/2022 Name: Hayley [name] MRN: [id number] DOB: 02-Jan-1946   Care Coordination Outreach Attempts:  An unsuccessful telephone outreach was attempted for a scheduled appointment today.I attempted to contact patients daughter as well, who is on the DPR due to inability to contact the patient. If I do not receive a call back over the next 24 hours I will attempt patient once more.  Follow Up Plan:  Additional outreach attempts will be made to offer the patient care coordination information and services.   Encounter Outcome:  No Answer   Care Coordination Interventions:  No, not indicated    Daneen Schick, BSW, CDP Social Worker, Certified Dementia Practitioner [place] [facility]  Care Coordination [phone]

## 2022-10-23 ENCOUNTER — Ambulatory Visit: Payer: Self-pay

## 2022-10-23 ENCOUNTER — Telehealth: Payer: Self-pay

## 2022-10-23 ENCOUNTER — Telehealth: Payer: Self-pay | Admitting: *Deleted

## 2022-10-23 NOTE — Patient Instructions (Signed)
Visit Information  Thank you for taking time to visit with me today. Please don't hesitate to contact me if I can be of assistance to you.   Following are the goals we discussed today:   Goals Addressed             This Visit's Progress    Care Coordination Activities       Care Coordination Interventions: Connected with patient who reports she is still experiencing some shortness of breath upon exertion since recent COVID infection Discussed the patient has completed her Prednisone prescription. No fever noted today and current O2 saturation is reading at 96. Patient is no longer needing to wear oxygen during the day but is notably out of breath when talking for an extended period Collaboration with [place] who is unavailable to speak with patient during call with SW Scheduled a follow up with RN Care Manager 2/7 at 12:00 pm Advised the patient to contact her primary care providers office as needed or dial 911 if needed        Our next appointment is by telephone on 2/13 at 1:00 pm  Please call the care guide team at [phone] if you need to cancel or reschedule your appointment.   If you are experiencing a Mental Health or [place] or need someone to talk to, please call 911  Patient verbalizes understanding of instructions and care plan provided today and agrees to view in [place]. Active MyChart status and patient understanding of how to access instructions and care plan via MyChart confirmed with patient.     Telephone follow up appointment with care management team member scheduled for:2/7 to speak with RN Care Manager  Daneen Schick, Arita Miss, CDP Social Worker, Certified Dementia Practitioner [facility] Care Management  Care Coordination [phone]

## 2022-10-23 NOTE — Patient Outreach (Addendum)
  Care Coordination   Follow Up Visit Note   10/23/2022 Name: Hayley [name] MRN: [id number] DOB: 23-Apr-1946  Hayley [name] is a 77 y.o. year old female who sees Jonathon Jordan, MD for primary care. I spoke with  [name] by phone today.  What matters to the patients health and wellness today?  COVID recovery-Shortness of breath.    Goals Addressed               This Visit's Progress     Food/Utilities/Transportation/housing issues and verify PCP establishment (pt-stated)        Care Coordination Interventions: Provided education to patient re: Levels of care for assisted living facilities Reviewed medications with patient and discussed adherence with no needed refills Collaborated with [facility] office regarding confirmation of pt's with a provider as requested by patient. Reviewed scheduled/upcoming provider appointments including Current has sufficient transportation however very difficult as pt uses her own care with limited assistance. 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 This referral was given to Daneen Schick to address all above with the outreach. Other servies maybe mentioned and pt prefers consistency. Discussed plans with patient for ongoing care management follow up and provided patient with direct contact information for care management team Screening for signs and symptoms of depression related to chronic disease state  Assessed social determinant of health barriers Educated on care management services and available resources. Pt receptive with appropriate referrals made.  Pt agreed to a follow up call over the next several weeks. Also contacted Dr. Stephanie Acre office and left a confidential voice message with the RN concerning pt's requesting confirmation on the established PCP with this pt. Requested office contact  pt directly with confirmation.  10/23/22 Patient recovering from [place].  Having some shortness of breath. O2 sat 96%.  Temp 98.6 Discussed using mucinex for mucous control and albuterol for shortness of breath.  Patient to call physician for possible appointment.  Patient currently taking clindamycin for dental procedure.  Patient reports doing quite a bit today. Advised her to rest the rest of the afternoon.  Advised if worse to return to ER.         SDOH assessments and interventions completed:  Yes     Care Coordination Interventions:  Yes, provided   Follow up plan: Follow up call scheduled for 10/26/22    Encounter Outcome:  Pt. Visit Completed   Jone Baseman, RN, MSN [place] [facility] Care Management Coordinator Direct Line [phone]

## 2022-10-23 NOTE — Patient Outreach (Signed)
  Care Coordination   Follow Up Visit Note   10/23/2022 Name: Hayley [name] MRN: [id number] DOB: 05-11-1946  Hayley [name] is a 77 y.o. year old female who sees Jonathon Jordan, MD for primary care. I spoke with  [name] by phone today.  What matters to the patients health and wellness today?  I want to feel better    Goals Addressed             This Visit's Progress    Care Coordination Activities       Care Coordination Interventions: Connected with patient who reports she is still experiencing some shortness of breath upon exertion since recent COVID infection Discussed the patient has completed her Prednisone prescription. No fever noted today and current O2 saturation is reading at 96. Patient is no longer needing to wear oxygen during the day but is notably out of breath when talking for an extended period Collaboration with [facility] who is unavailable to speak with patient during call with SW Scheduled a follow up with RN Care Manager 2/7 at 12:00 pm Advised the patient to contact her primary care providers office as needed or dial 911 if needed        SDOH assessments and interventions completed:  No     Care Coordination Interventions:  Yes, provided   Interventions Today    Flowsheet Row Most Recent Value  Chronic Disease Discussed/Reviewed   Chronic disease discussed/reviewed during today's visit Diabetes, Hypertension (HTN), Other  [Asthma]  General Interventions   General Interventions Discussed/Reviewed General Interventions Discussed, Referral to Nurse        Follow up plan: Referral made to RN Care Manager. Appointment scheduled for 2/7 Follow up call scheduled for 2/13 with SW    Encounter Outcome:  Pt. Visit Completed   Daneen Schick, BSW, CDP Social Worker, Certified Dementia Practitioner [facility]  Care Coordination [phone]

## 2022-10-23 NOTE — Patient Instructions (Signed)
Visit Information  Thank you for taking time to visit with me today. Please don't hesitate to contact me if I can be of assistance to you.   Following are the goals we discussed today:   Goals Addressed               This Visit's Progress     Food/Utilities/Transportation/housing issues and verify PCP establishment (pt-stated)        Care Coordination Interventions: Provided education to patient re: Levels of care for assisted living facilities Reviewed medications with patient and discussed adherence with no needed refills Collaborated with [facility] office regarding confirmation of pt's with a provider as requested by patient. Reviewed scheduled/upcoming provider appointments including Current has sufficient transportation however very difficult as pt uses her own care with limited assistance. 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 This referral was given to Daneen Schick to address all above with the outreach. Other servies maybe mentioned and pt prefers consistency. Discussed plans with patient for ongoing care management follow up and provided patient with direct contact information for care management team Screening for signs and symptoms of depression related to chronic disease state  Assessed social determinant of health barriers Educated on care management services and available resources. Pt receptive with appropriate referrals made.  Pt agreed to a follow up call over the next several weeks. Also contacted Dr. Stephanie Acre office and left a confidential voice message with the RN concerning pt's requesting confirmation on the established PCP with this pt. Requested office contact pt directly with confirmation.  10/23/22 Patient recovering from [place].  Having some shortness of breath. O2 sat 96%.  Temp 98.6 Discussed using  mucinex for mucous control and albuterol for shortness of breath.  Patient to call physician for possible appointment.  Patient currently taking clindamycin for dental procedure.  Patient reports doing quite a bit today. Advised her to rest the rest of the afternoon.  Advised if worse to return to ER.         Our next appointment is by telephone on 10/26/22 at 1000  Please call the care guide team at [phone] if you need to cancel or reschedule your appointment.   If you are experiencing a Mental Health or [place] or need someone to talk to, please call the Suicide and Crisis Lifeline: 988   The patient verbalized understanding of instructions, educational materials, and care plan provided today and agreed to receive a mailed copy of patient instructions, educational materials, and care plan.   The patient has been provided with contact information for the care management team and has been advised to call with any health related questions or concerns.   Jone Baseman, RN, MSN [facility] [facility] Care Management Coordinator Direct Line [phone]

## 2022-10-23 NOTE — Patient Outreach (Signed)
  Care Coordination   10/23/2022 Name: Hayley [name] MRN: [id number] DOB: Aug 29, 1946   Care Coordination Outreach Attempts:  An unsuccessful telephone outreach was attempted today to offer the patient information about available care coordination services as a benefit of their health plan.   Follow Up Plan:  Additional outreach attempts will be made to offer the patient care coordination information and services.   Encounter Outcome:  No Answer   Care Coordination Interventions:  No, not indicated     Jone Baseman, RN, MSN [place] [place] [facility] Care Management Coordinator Direct Line [phone]

## 2022-10-23 NOTE — Progress Notes (Signed)
  Care Coordination Note  10/23/2022 Name: LEYAH BOCCHINO MRN: [id number] DOB: 1946-05-03  Hayley [name] is a 77 y.o. year old female who is a primary care patient of Jonathon Jordan, MD and is actively engaged with the care management team. I reached out to [name] by phone today to assist with re-scheduling a follow up visit with the RN Case Manager  Follow up plan: Unsuccessful telephone outreach attempt made. A HIPAA compliant phone message was left for the patient providing contact information and requesting a return call.   Julian Hy, [place] Direct Dial: [phone]

## 2022-10-24 DIAGNOSIS — R053 Chronic cough: Secondary | ICD-10-CM | POA: Diagnosis not present

## 2022-10-24 DIAGNOSIS — U071 COVID-19: Secondary | ICD-10-CM | POA: Diagnosis not present

## 2022-10-26 ENCOUNTER — Ambulatory Visit: Payer: Self-pay

## 2022-10-26 NOTE — Patient Instructions (Signed)
Visit Information  Thank you for taking time to visit with me today. Please don't hesitate to contact me if I can be of assistance to you.   Following are the goals we discussed today:   Goals Addressed               This Visit's Progress     Food/Utilities/Transportation/housing issues and verify PCP establishment (pt-stated)        Care Coordination Interventions: Provided education to patient re: Levels of care for assisted living facilities Reviewed medications with patient and discussed adherence with no needed refills Collaborated with [facility] office regarding confirmation of pt's with a provider as requested by patient. Reviewed scheduled/upcoming provider appointments including Current has sufficient transportation however very difficult as pt uses her own care with limited assistance. 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 This referral was given to Daneen Schick to address all above with the outreach. Other servies maybe mentioned and pt prefers consistency. Discussed plans with patient for ongoing care management follow up and provided patient with direct contact information for care management team Screening for signs and symptoms of depression related to chronic disease state  Assessed social determinant of health barriers Educated on care management services and available resources. Pt receptive with appropriate referrals made.  Pt agreed to a follow up call over the next several weeks. Also contacted Dr. Stephanie Acre office and left a confidential voice message with the RN concerning pt's requesting confirmation on the established PCP with this pt. Requested office contact pt directly with confirmation.  10/23/22 Patient recovering from [place].  Having some shortness of breath. O2 sat 96%.  Temp 98.6 Discussed using  mucinex for mucous control and albuterol for shortness of breath.  Patient to call physician for possible appointment.  Patient currently taking clindamycin for dental procedure.  Patient reports doing quite a bit today. Advised her to rest the rest of the afternoon.  Advised if worse to return to ER.   10/26/22 Patient doing ok.  Telephone visit with PCP.  Prednisone ordered and continue mucinex.  She was concerned about heart rate in the 90's when up and about.  Discussed heart rate and when to seek medical attention for sustained rapid heart rate.  Advised patient to rest and pace herself with activities. She verbalized understanding.          Our next appointment is by telephone on 11/01/22 at 1100  Please call the care guide team at [phone] if you need to cancel or reschedule your appointment.   If you are experiencing a Mental Health or [place] or need someone to talk to, please call the Suicide and Crisis Lifeline: 988   Patient verbalizes understanding of instructions and care plan provided today and agrees to view in [place]. Active MyChart status and patient understanding of how to access instructions and care plan via MyChart confirmed with patient.     The patient has been provided with contact information for the care management team and has been advised to call with any health related questions or concerns.    Jone Baseman, RN, MSN [place] [facility] Care Management Coordinator Direct Line [phone]

## 2022-10-26 NOTE — Patient Outreach (Signed)
  Care Coordination   Initial Visit Note   10/26/2022 Name: Hayley [name] MRN: [id number] DOB: 11-15-1945  Hayley [name] is a 77 y.o. year old female who sees Hayley Jordan, MD for primary care. I spoke with  Hayley [name] by phone today.  What matters to the patients health and wellness today?  Increased heart rate    Goals Addressed               This Visit's Progress     Food/Utilities/Transportation/housing issues and verify PCP establishment (pt-stated)        Care Coordination Interventions: Provided education to patient re: Levels of care for assisted living facilities Reviewed medications with patient and discussed adherence with no needed refills Collaborated with Hayley [name] office regarding confirmation of pt's with a provider as requested by patient. Reviewed scheduled/upcoming provider appointments including Current has sufficient transportation however very difficult as pt uses her own care with limited assistance. 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 This referral was given to Hayley [name] to address all above with the outreach. Other servies maybe mentioned and pt prefers consistency. Discussed plans with patient for ongoing care [name] follow up and provided patient with direct contact information for care [name] team Screening for signs and symptoms of depression related to chronic disease state  Assessed social determinant of health barriers Educated on care [name] services and available resources. Pt receptive with appropriate referrals made.  Pt agreed to a follow up call over the next several weeks. Also contacted Dr. Stephanie [name] office and left a confidential voice message with the RN concerning pt's requesting confirmation on the established PCP with this pt. Requested office contact pt directly with  confirmation.  10/23/22 Patient recovering from [place].  Having some shortness of breath. O2 sat 96%.  Temp 98.6 Discussed using mucinex for mucous control and albuterol for shortness of breath.  Patient to call physician for possible appointment.  Patient currently taking clindamycin for dental procedure.  Patient reports doing quite a bit today. Advised her to rest the rest of the afternoon.  Advised if worse to return to ER.   10/26/22 Patient doing ok.  Telephone visit with PCP.  Prednisone ordered and continue mucinex.  She was concerned about heart rate in the 90's when up and about.  Discussed heart rate and when to seek medical attention for sustained rapid heart rate.  Advised patient to rest and pace herself with activities. She verbalized understanding.          SDOH assessments and interventions completed:  Yes     Care Coordination Interventions:  Yes, provided   Follow up plan: Follow up call scheduled for 11/01/22    Encounter Outcome:  Pt. Visit Completed   Hayley Baseman, RN, MSN Hayley [name] Care [name] Coordinator Direct Line [phone]

## 2022-10-29 ENCOUNTER — Other Ambulatory Visit (HOSPITAL_COMMUNITY): Payer: Self-pay | Admitting: Psychiatry

## 2022-10-29 DIAGNOSIS — F419 Anxiety disorder, unspecified: Secondary | ICD-10-CM

## 2022-10-29 DIAGNOSIS — F322 Major depressive disorder, single episode, severe without psychotic features: Secondary | ICD-10-CM

## 2022-10-30 ENCOUNTER — Telehealth: Payer: Self-pay

## 2022-10-30 NOTE — Patient Outreach (Signed)
  Care Coordination   10/30/2022 Name: MALETA PACHA MRN: [id number] DOB: 20-Dec-1945   Care Coordination Outreach Attempts:  An unsuccessful telephone outreach was attempted for a scheduled appointment today.  Follow Up Plan:  Additional outreach attempts will be made to offer the patient care coordination information and services.   Encounter Outcome:  No Answer   Care Coordination Interventions:  No, not indicated    Daneen Schick, BSW, CDP Social Worker, Certified Dementia Practitioner [place] [facility]  Care Coordination [phone]

## 2022-10-31 ENCOUNTER — Telehealth (HOSPITAL_BASED_OUTPATIENT_CLINIC_OR_DEPARTMENT_OTHER): Payer: Medicare Other | Admitting: Psychiatry

## 2022-10-31 ENCOUNTER — Encounter (HOSPITAL_COMMUNITY): Payer: Self-pay | Admitting: Psychiatry

## 2022-10-31 DIAGNOSIS — F419 Anxiety disorder, unspecified: Secondary | ICD-10-CM | POA: Diagnosis not present

## 2022-10-31 DIAGNOSIS — F322 Major depressive disorder, single episode, severe without psychotic features: Secondary | ICD-10-CM

## 2022-10-31 MED ORDER — VORTIOXETINE HBR 20 MG PO TABS: 20.0000 mg | ORAL_TABLET | Freq: Every day | ORAL | 0 refills | Status: DC

## 2022-10-31 MED ORDER — LORAZEPAM 0.5 MG PO TABS: 0.5000 mg | ORAL_TABLET | Freq: Every day | ORAL | 0 refills | Status: DC | PRN

## 2022-10-31 MED ORDER — TRAZODONE HCL 150 MG PO TABS: 150.0000 mg | ORAL_TABLET | Freq: Every day | ORAL | 0 refills | Status: DC

## 2022-10-31 NOTE — Progress Notes (Signed)
[facility] MD Virtual Progress Note   Patient Location: Home  Provider Location: Home Office  I connect with patient by telephone and verified that I am speaking with correct person by using two identifiers. I discussed the limitations of evaluation and management by telemedicine and the availability of in person appointments. I also discussed with the patient that there may be a patient responsible charge related to this service. The patient expressed understanding and agreed to proceed.  Hayley [name] [id number] 76 y.o.  10/31/2022 3:14 PM    History of Present Illness:  Patient is evaluated by phone session.  She reported increased anxiety, dysphoria, fatigue, depression.  She had a COVID last month and she is struggling with energy level and she felt more depressed.  She started to worry if something is going to happen to her.  She was feeling lonely.  Her daughter did drop the grocery and her son who lives in [place] does talk to her but she still feels very sad depressed.  She has headaches, fatigue and moments of dysphoria and hopelessness.  She endorses some time crying spells but denies any suicidal thoughts or paranoia.  She feels ruminative and negative thoughts and no motivation to do things.  Slowly and gradually she is getting better.  She has chronic tremors which she feels worst in past few weeks.  Her sleep is fair.  She is compliant with trazodone and Trintellix and lately has taken few times Ativan to help with anxiety.  She denies any agitation, anger.  She denies drinking or using any illegal substances.  She reported likely that she had food store and freezer that did help when she was sick.  She also had neighbors who sometimes checks on her.  Patient reported a lot of financial issues because she lost her Medicaid card and not able to get affordable medication.  However she does feel the medicine working and even wondering if we can increase the Trintellix 20  mg.  She used to take 20 mg in the past but cut down after feeling better.   Past Psychiatric History: H/O depression and anxiety.  H/O suicidal attempt on Percocet in March 2019.  Took Effexor in her 65s but stopped after working for a while.  Tried Zoloft, Paxil and Wellbutrin from PCP but did not work. Did IOP, PHP and given Prozac but no improvement. Tried lexapro caused GI s/e, Lamictal caused increase tremors.     Outpatient Encounter Medications as of 10/31/2022  Medication Sig   albuterol (PROVENTIL) (2.5 MG/3ML) 0.083% nebulizer solution Take 2.5 mg by nebulization every 4 (four) hours as needed for wheezing or shortness of breath.   apixaban (ELIQUIS) 2.5 MG TABS tablet Take 1 tablet (2.5 mg total) by mouth 2 (two) times daily.   clindamycin (CLEOCIN) 300 MG capsule Take 600 mg by mouth once.   diclofenac (FLECTOR) 1.3 % PTCH Place 1 patch onto the skin daily. (Patient taking differently: Place 1 patch onto the skin daily as needed (Joint and muscle pain).)   HYDROcodone-acetaminophen (NORCO) 10-325 MG tablet Take 1 tablet by mouth every 6 (six) hours.   hydrOXYzine (ATARAX/VISTARIL) 25 MG tablet Take 1 tablet (25 mg total) by mouth 3 (three) times daily as needed for anxiety, itching or nausea.   hyoscyamine (ANASPAZ) 0.125 MG TBDP disintergrating tablet Place 0.125 mg under the tongue every 4 (four) hours as needed for cramping (Disgestion).   ipratropium (ATROVENT) 0.03 % nasal spray Place 2 sprays into  both nostrils every 12 (twelve) hours.   Ipratropium-Albuterol (COMBIVENT IN) Inhale 1 application. into the lungs daily as needed (Wheezing).   levothyroxine (SYNTHROID, LEVOTHROID) 25 MCG tablet Take 25 mcg by mouth every morning.    LORazepam (ATIVAN) 0.5 MG tablet Take 1 tablet (0.5 mg total) by mouth daily as needed for anxiety.   mometasone-formoterol (DULERA) 100-5 MCG/ACT AERO Inhale 2 puffs into the lungs 2 (two) times daily as needed for wheezing or shortness of breath.    nystatin (MYCOSTATIN) 100000 UNIT/ML suspension Use as directed in the mouth or throat daily as needed (Thrush).   promethazine-dextromethorphan (PROMETHAZINE-DM) 6.25-15 MG/5ML syrup TAKE 5 MILLILITERS BY MOUTH EVERY 6 HOURS AS NEEDED   propranolol (INDERAL) 10 MG tablet Take 1 tablet (10 mg total) by mouth 2 (two) times daily as needed. (Patient taking differently: Take 10 mg by mouth 2 (two) times daily as needed. 30 in before going out)   propranolol ER (INDERAL LA) 120 MG 24 hr capsule Take 1 capsule (120 mg total) by mouth daily.   Rimegepant Sulfate (NURTEC) 75 MG TBDP Take 1 tab at onset of migraine.  May repeat in 2 hrs, if needed.  Max dose: 2 tabs/day. This is a 30 day prescription.   simethicone (MYLICON) 80 MG chewable tablet Chew 1 tablet (80 mg total) by mouth 4 (four) times daily as needed for flatulence (belching).   tiZANidine (ZANAFLEX) 4 MG tablet Take 4 mg by mouth at bedtime as needed for muscle spasms.   traZODone (DESYREL) 150 MG tablet Take 1 tablet (150 mg total) by mouth at bedtime.   valACYclovir (VALTREX) 1000 MG tablet Take 2,000 mg by mouth 2 (two) times daily as needed (Fever blisters). Reported on 01/10/2016   vortioxetine HBr (TRINTELLIX) 20 MG TABS tablet Take 1 tablet (20 mg total) by mouth daily.   [DISCONTINUED] LORazepam (ATIVAN) 0.5 MG tablet Take 1 tablet (0.5 mg total) by mouth daily as needed for anxiety.   [DISCONTINUED] traZODone (DESYREL) 150 MG tablet Take 1 tablet (150 mg total) by mouth at bedtime.   [DISCONTINUED] vortioxetine HBr (TRINTELLIX) 10 MG TABS tablet Take 1 tablet (10 mg total) by mouth daily.   No facility-administered encounter medications on file as of 10/31/2022.    Recent Results (from the past 2160 hour(s))  Resp panel by RT-PCR (RSV, Flu A&B, Covid) Anterior Nasal Swab     Status: Abnormal   Collection Time: 10/10/22  6:20 PM   Specimen: Anterior Nasal Swab  Result Value Ref Range   SARS Coronavirus 2 by RT PCR POSITIVE (A)  NEGATIVE    Comment: (NOTE) SARS-CoV-2 target nucleic acids are DETECTED.  The SARS-CoV-2 RNA is generally detectable in upper respiratory specimens during the acute phase of infection. Positive results are indicative of the presence of the identified virus, but do not rule out bacterial infection or co-infection with other pathogens not detected by the test. Clinical correlation with patient history and other diagnostic information is necessary to determine patient infection status. The expected result is Negative.  Fact Sheet for Patients: EntrepreneurPulse.com.au  Fact Sheet for Healthcare Providers: IncredibleEmployment.be  This test is not yet approved or cleared by the [place] FDA and  has been authorized for detection and/or diagnosis of SARS-CoV-2 by FDA under an Emergency Use Authorization (EUA).  This EUA will remain in effect (meaning this test can be used) for the duration of  the COVID-19 declaration under Section 564(b)(1) of the A ct, 21 U.S.C. section 360bbb-3(b)(1), unless the  authorization is terminated or revoked sooner.     Influenza A by PCR NEGATIVE NEGATIVE   Influenza B by PCR NEGATIVE NEGATIVE    Comment: (NOTE) The Xpert Xpress SARS-CoV-2/FLU/RSV plus assay is intended as an aid in the diagnosis of influenza from Nasopharyngeal swab specimens and should not be used as a sole basis for treatment. Nasal washings and aspirates are unacceptable for Xpert Xpress SARS-CoV-2/FLU/RSV testing.  Fact Sheet for Patients: EntrepreneurPulse.com.au  Fact Sheet for Healthcare Providers: IncredibleEmployment.be  This test is not yet approved or cleared by the [place] FDA and has been authorized for detection and/or diagnosis of SARS-CoV-2 by FDA under an Emergency Use Authorization (EUA). This EUA will remain in effect (meaning this test can be used) for the duration of  the COVID-19 declaration under Section 564(b)(1) of the Act, 21 U.S.C. section 360bbb-3(b)(1), unless the authorization is terminated or revoked.     Resp Syncytial Virus by PCR NEGATIVE NEGATIVE    Comment: (NOTE) Fact Sheet for Patients: EntrepreneurPulse.com.au  Fact Sheet for Healthcare Providers: IncredibleEmployment.be  This test is not yet approved or cleared by the [place] FDA and has been authorized for detection and/or diagnosis of SARS-CoV-2 by FDA under an Emergency Use Authorization (EUA). This EUA will remain in effect (meaning this test can be used) for the duration of the COVID-19 declaration under Section 564(b)(1) of the Act, 21 U.S.C. section 360bbb-3(b)(1), unless the authorization is terminated or revoked.  Performed at [facility], [place] [address], [place], [place] [address]   CBC     Status: Abnormal   Collection Time: 10/10/22  6:20 PM  Result Value Ref Range   WBC 4.3 4.0 - 10.5 K/uL   RBC 4.12 3.87 - 5.11 MIL/uL   Hemoglobin 10.5 (L) 12.0 - 15.0 g/dL   HCT 34.2 (L) 36.0 - 46.0 %   MCV 83.0 80.0 - 100.0 fL   MCH 25.5 (L) 26.0 - 34.0 pg   MCHC 30.7 30.0 - 36.0 g/dL   RDW 14.3 11.5 - 15.5 %   Platelets 190 150 - 400 K/uL   nRBC 0.0 0.0 - 0.2 %    Comment: Performed at [facility], [place] [address], [place], [place] [address]  Comprehensive metabolic panel     Status: Abnormal   Collection Time: 10/10/22  6:20 PM  Result Value Ref Range   Sodium 137 135 - 145 mmol/L   Potassium 2.9 (L) 3.5 - 5.1 mmol/L   Chloride 106 98 - 111 mmol/L   CO2 24 22 - 32 mmol/L   Glucose, Bld 85 70 - 99 mg/dL    Comment: Glucose reference range applies only to samples taken after fasting for at least 8 hours.   BUN 9 8 - 23 mg/dL   Creatinine, Ser 0.65 0.44 - 1.00 mg/dL   Calcium 6.7 (L) 8.9 - 10.3 mg/dL   Total Protein 5.4 (L) 6.5 - 8.1 g/dL   Albumin 2.7 (L) 3.5 - 5.0 g/dL   AST 18 15  - 41 U/L   ALT 7 0 - 44 U/L   Alkaline Phosphatase 30 (L) 38 - 126 U/L   Total Bilirubin 0.5 0.3 - 1.2 mg/dL   GFR, Estimated >60 >60 mL/min    Comment: (NOTE) Calculated using the CKD-EPI Creatinine Equation (2021)    Anion gap 7 5 - 15    Comment: Performed at [facility], [place] [address], [place], [place] [address]     Psychiatric  Specialty Exam: Physical Exam  Review of Systems  Constitutional:  Positive for fatigue.  Respiratory:         Cogestion  Neurological:  Positive for headaches.    Weight 175 lb (79.4 kg), last menstrual period 09/18/1979.There is no height or weight on file to calculate BMI.  General Appearance: NA  Eye Contact:  NA  Speech:  Slow  Volume:  Decreased  Mood:  Anxious, Depressed, and Dysphoric  Affect:  NA  Thought Process:  Goal Directed  Orientation:  Full (Time, Place, and Person)  Thought Content:  Rumination  Suicidal Thoughts:  No  Homicidal Thoughts:  No  Memory:  Immediate;   Good Recent;   Good Remote;   Fair  Judgement:  Intact  Insight:  Present  Psychomotor Activity:  Tremor  Concentration:  Concentration: Fair and Attention Span: Fair  Recall:  Good  Fund of Knowledge:  Good  Language:  Good  Akathisia:  No  Handed:  Right  AIMS (if indicated):     Assets:  Communication Skills Desire for Improvement Housing  ADL's:  Intact  Cognition:  WNL  Sleep:  fair     Assessment/Plan: MDD (major depressive episode), single episode, severe, no psychosis ([place])  Anxiety  I reviewed blood work results, current medication, psychosocial stressors.  She is experiencing increased depression, anxiety, dysphoria and more loneliness and sadness.  We talk about going back to Trintellix 20 mg.  We have this discussion in the past but at that time patient did not want but now she is willing to go up on 20 mg.  She will also need a new prescription of Ativan to help with anxiety.  She does take 0.5 mg #15 to take as  needed.  Continue trazodone 150 mg daily.  Her potassium is low.  She is trying to get better and hoping slowly and gradually recover from [place].  I encouraged continued to communicate with her daughter who live in town and son who live in [place].  Recommended to call us back if she has any question or any concern.  Follow-up in 2 months.   Follow Up Instructions:     I discussed the assessment and treatment plan with the patient. The patient was provided an opportunity to ask questions and all were answered. The patient agreed with the plan and demonstrated an understanding of the instructions.   The patient was advised to call back or seek an in-person evaluation if the symptoms worsen or if the condition fails to improve as anticipated.    Collaboration of Care: Other provider involved in patient's care AEB notes are available in epic to review.  Patient/Guardian was advised Release of Information must be obtained prior to any record release in order to collaborate their care with an outside provider. Patient/Guardian was advised if they have not already done so to contact the registration department to sign all necessary forms in order for [place] to release information regarding their care.   Consent: Patient/Guardian gives verbal consent for treatment and assignment of benefits for services provided during this visit. Patient/Guardian expressed understanding and agreed to proceed.     I provided 30 minutes of non face to face time during this encounter.  [name], MD 10/31/2022

## 2022-11-01 ENCOUNTER — Ambulatory Visit: Payer: Self-pay

## 2022-11-01 NOTE — Patient Outreach (Signed)
  Care Coordination   Initial Visit Note   11/01/2022 Name: Hayley [name] MRN: [id number] DOB: 1946-04-08  Hayley [name] is a 77 y.o. year old female who sees Hayley Jordan, MD for primary care. I spoke with  Hayley [name] by phone today.  What matters to the patients health and wellness today?  Getting better    Goals Addressed   None     SDOH assessments and interventions completed:  Yes     Care Coordination Interventions:  Yes, provided   Follow up plan: Follow up call scheduled for 10/2922    Encounter Outcome:  Pt. Visit Completed   Jone Baseman, RN, MSN [facility] Care Management Coordinator Direct Line [phone]

## 2022-11-01 NOTE — Patient Instructions (Signed)
Visit Information  Thank you for taking time to visit with me today. Please don't hesitate to contact me if I can be of assistance to you.   Following are the goals we discussed today:   Goals Addressed   None     Our next appointment is by telephone on 11/15/22 at 1030  Please call the care guide team at [phone] if you need to cancel or reschedule your appointment.   If you are experiencing a Mental Health or [place] or need someone to talk to, please call the Suicide and Crisis Lifeline: [phone]   Patient verbalizes understanding of instructions and care plan provided today and agrees to view in [place]. Active MyChart status and patient understanding of how to access instructions and care plan via MyChart confirmed with patient.     The patient has been provided with contact information for the care management team and has been advised to call with any health related questions or concerns.   Jone Baseman, RN, MSN [facility] Care Management Coordinator Direct Line [phone]

## 2022-11-06 ENCOUNTER — Telehealth: Payer: Self-pay | Admitting: Neurology

## 2022-11-06 NOTE — Telephone Encounter (Signed)
LVM and sent mychart msg informing pt of appointment change due to NP being out

## 2022-11-12 ENCOUNTER — Other Ambulatory Visit: Payer: Self-pay | Admitting: Pulmonary Disease

## 2022-11-12 DIAGNOSIS — R053 Chronic cough: Secondary | ICD-10-CM

## 2022-11-13 ENCOUNTER — Ambulatory Visit: Payer: Self-pay

## 2022-11-13 NOTE — Patient Instructions (Signed)
Visit Information  Thank you for taking time to visit with me today. Please don't hesitate to contact me if I can be of assistance to you.   Following are the goals we discussed today:   Goals Addressed             This Visit's Progress    Care Coordination Activities       Care Coordination Interventions: Determined the patient continues to feel bad post covid infection indicating her tremor has worsened where it is hard to do things around the house. Patient further reports no appetite, when she does eat she experiences chest pain and lose stool. Patient reports she still coughs often and when it is productive she has yellow/green sputum. Patient continues to experience a low grade fever and asks what she can do to improve Advised the patient to contact her primary care provider to report ongoing symptoms and to determine if a follow up office visit is needed Referral placed to [place] for Moms Meals in order to allow patient adequate nutrition while she is recovering Collaboration with [facility] to advise of interventions and plan for patient to contact her primary care provider Reminded patient of future call scheduled with [place] on 2/28        Your next appointment is by telephone on 2/29 at 10:30 with [place]  Please call the care guide team at [phone] if you need to cancel or reschedule your appointment.   If you are experiencing a Mental Health or [place] or need someone to talk to, please call 911  Patient verbalizes understanding of instructions and care plan provided today and agrees to view in [place]. Active MyChart status and patient understanding of how to access instructions and care plan via MyChart confirmed with patient.     Daneen Schick, BSW, CDP Social Worker, Certified Dementia Practitioner [facility]  Care Coordination [phone]

## 2022-11-13 NOTE — Patient Outreach (Signed)
  Care Coordination   Follow Up Visit Note   11/13/2022 Name: Hayley [name] MRN: [id number] DOB: 1946-06-22  Hayley [name] is a 77 y.o. year old female who sees Jonathon Jordan, MD for primary care. I spoke with  Hayley [name] by phone today.  What matters to the patients health and wellness today?  I want to feel better    Goals Addressed             This Visit's Progress    Care Coordination Activities       Care Coordination Interventions: Determined the patient continues to feel bad post covid infection indicating her tremor has worsened where it is hard to do things around the house. Patient further reports no appetite, when she does eat she experiences chest pain and lose stool. Patient reports she still coughs often and when it is productive she has yellow/green sputum. Patient continues to experience a low grade fever and asks what she can do to improve Advised the patient to contact her primary care provider to report ongoing symptoms and to determine if a follow up office visit is needed Referral placed to [place] for Moms Meals in order to allow patient adequate nutrition while she is recovering Collaboration with RN Care Manager to advise of interventions and plan for patient to contact her primary care provider Reminded patient of future call scheduled with RN Care Manager on 2/28        SDOH assessments and interventions completed:  No     Care Coordination Interventions:  Yes, provided   Interventions Today    Flowsheet Row Most Recent Value  Chronic Disease   Chronic disease during today's visit Diabetes, Hypertension (HTN)  General Interventions   General Interventions Discussed/Reviewed General Interventions Reviewed, Communication with, Doctor Visits  Communication with RN  Nutrition Interventions   Nutrition Discussed/Reviewed Nutrition Discussed        Follow up plan:  SW will follow up with the patient over the next week to confirm meals  received.    Encounter Outcome:  Pt. Visit Completed   Daneen Schick, BSW, CDP Social Worker, Certified Dementia Practitioner [facility]  Care Coordination [phone]

## 2022-11-14 DIAGNOSIS — R053 Chronic cough: Secondary | ICD-10-CM | POA: Diagnosis not present

## 2022-11-14 DIAGNOSIS — E538 Deficiency of other specified B group vitamins: Secondary | ICD-10-CM | POA: Diagnosis not present

## 2022-11-14 DIAGNOSIS — R399 Unspecified symptoms and signs involving the genitourinary system: Secondary | ICD-10-CM | POA: Diagnosis not present

## 2022-11-14 DIAGNOSIS — U071 COVID-19: Secondary | ICD-10-CM | POA: Diagnosis not present

## 2022-11-14 DIAGNOSIS — R5383 Other fatigue: Secondary | ICD-10-CM | POA: Diagnosis not present

## 2022-11-15 ENCOUNTER — Ambulatory Visit: Payer: Self-pay

## 2022-11-15 DIAGNOSIS — R5383 Other fatigue: Secondary | ICD-10-CM | POA: Diagnosis not present

## 2022-11-15 DIAGNOSIS — E538 Deficiency of other specified B group vitamins: Secondary | ICD-10-CM | POA: Diagnosis not present

## 2022-11-15 DIAGNOSIS — R399 Unspecified symptoms and signs involving the genitourinary system: Secondary | ICD-10-CM | POA: Diagnosis not present

## 2022-11-15 NOTE — Patient Instructions (Signed)
Visit Information  Thank you for taking time to visit with me today. Please don't hesitate to contact me if I can be of assistance to you.   Following are the goals we discussed today:   Goals Addressed               This Visit's Progress     COVID recovery (pt-stated)        Patient continues to have some affects from [place]. To see PCP today for follow up.  Discussed COVID recovery and how some recovery is longer than others. She verbalized understanding.    Patient Goals/Self Care Activities: -Patient/Caregiver will self-administer medications as prescribed as evidenced by self-report/primary caregiver report  -Patient/Caregiver will attend all scheduled provider appointments as evidenced by clinician review of documented attendance to scheduled appointments and patient/caregiver report -Patient/Caregiver will call provider office for new concerns or questions as evidenced by review of documented incoming telephone call notes and patient report     Interventions Today    Flowsheet Row Most Recent Value  Chronic Disease   Chronic disease during today's visit Other  [COVID recovery]  General Interventions   General Interventions Discussed/Reviewed General Interventions Reviewed, Doctor Visits  Doctor Visits Discussed/Reviewed PCP  PCP/Specialist Visits Compliance with follow-up visit  Education Interventions   Education Provided Provided Education  [COVID recovery]  Provided Verbal Education On Sick Day Rules             Our next appointment is by telephone on 11/29/22 at 1200  Please call the care guide team at [phone] if you need to cancel or reschedule your appointment.   If you are experiencing a Mental Health or [place] or need someone to talk to, please call the Suicide and Crisis Lifeline: 988   Patient verbalizes understanding of instructions and care plan provided today and agrees to view in [place]. Active MyChart status and patient  understanding of how to access instructions and care plan via MyChart confirmed with patient.     The patient has been provided with contact information for the care management team and has been advised to call with any health related questions or concerns.   Jone Baseman, RN, MSN [facility] Care Management Coordinator Direct Line [phone]

## 2022-11-15 NOTE — Patient Outreach (Signed)
  Care Coordination   Follow Up Visit Note   11/15/2022 Name: Hayley [name] MRN: [id number] DOB: 1945/10/23  Hayley [name] is a 77 y.o. year old female who sees Jonathon Jordan, MD for primary care. I spoke with  Hayley [name] by phone today.  What matters to the patients health and wellness today?  Continued COVID recovery    Goals Addressed               This Visit's Progress     COVID recovery (pt-stated)        Patient continues to have some affects from [place]. To see PCP today for follow up.  Discussed COVID recovery and how some recovery is longer than others. She verbalized understanding.    Patient Goals/Self Care Activities: -Patient/Caregiver will self-administer medications as prescribed as evidenced by self-report/primary caregiver report  -Patient/Caregiver will attend all scheduled provider appointments as evidenced by clinician review of documented attendance to scheduled appointments and patient/caregiver report -Patient/Caregiver will call provider office for new concerns or questions as evidenced by review of documented incoming telephone call notes and patient report     Interventions Today    Flowsheet Row Most Recent Value  Chronic Disease   Chronic disease during today's visit Other  [COVID recovery]  General Interventions   General Interventions Discussed/Reviewed General Interventions Reviewed, Doctor Visits  Doctor Visits Discussed/Reviewed PCP  PCP/Specialist Visits Compliance with follow-up visit  Education Interventions   Education Provided Provided Education  [COVID recovery]  Provided Verbal Education On Sick Day Rules             SDOH assessments and interventions completed:  Yes     Care Coordination Interventions:  Yes, provided   Follow up plan: Follow up call scheduled for March    Encounter Outcome:  Pt. Visit Completed   Jone Baseman, RN, MSN [facility] Care Management Coordinator Direct Line [phone]

## 2022-11-16 NOTE — Progress Notes (Signed)
Pt has been connected with RN

## 2022-11-19 ENCOUNTER — Ambulatory Visit: Payer: Self-pay

## 2022-11-19 ENCOUNTER — Telehealth: Payer: Self-pay

## 2022-11-19 NOTE — Patient Outreach (Signed)
  Care Coordination   11/19/2022 Name: Hayley [name] MRN: [id number] DOB: [birth year]   Care Coordination Outreach Attempts:  An unsuccessful telephone outreach was attempted for a scheduled appointment today. SW attempted to contact the patient to confirm receipt of Mom's Meals.   Follow Up Plan:  Additional outreach attempts will be made to offer the patient care coordination information and services.   Encounter Outcome:  No Answer   Care Coordination Interventions:  No, not indicated    Daneen Schick, BSW, CDP Social Worker, Certified Dementia Practitioner [facility]  Care Coordination [phone]

## 2022-11-19 NOTE — Patient Instructions (Signed)
Visit Information  Thank you for taking time to visit with me today. Please don't hesitate to contact me if I can be of assistance to you.   Following are the goals we discussed today:   Goals Addressed             This Visit's Progress    Care Coordination Activities       Care Coordination Interventions: Confirmed patient received Moms Meals and is enjoying the meals. month  Reviewed with the patient this meal benefit will last for one month, patient aware she may purchase meals to last longer but indicates this is out of her budget Discussed once patient is feeling better she can begin accessing [facility] food [employer] close to her home Patient reports she did go to her primary care provider recently and was diagnosed with a UTI, patient has not yet picked up antibiotic but plans to soon Patient is concerned with upcomming copay amounts needed to see her specialists - no resources available to assist with copay amounts SW will follow up with the patient over the next month         If you are experiencing a Mental Health or [place] or need someone to talk to, please call 911  Patient verbalizes understanding of instructions and care plan provided today and agrees to view in [place]. Active MyChart status and patient understanding of how to access instructions and care plan via MyChart confirmed with patient.     Daneen Schick, BSW, CDP Social Worker, Certified Dementia Practitioner [facility]  Care Coordination [phone]

## 2022-11-19 NOTE — Patient Outreach (Signed)
  Care Coordination   Follow Up Visit Note   11/19/2022 Name: Hayley [name] MRN: [id number] DOB: [birth year]  Hayley [name] is a 77 y.o. year old female who sees Hayley Jordan, MD for primary care. I spoke with  Hayley [name] by phone today.  What matters to the patients health and wellness today?  Ongoing meal support while recovering from COVID infection    Goals Addressed             This Visit's Progress    Care Coordination Activities       Care Coordination Interventions: Confirmed patient received Moms Meals and is enjoying the meals. month  Reviewed with the patient this meal benefit will last for one month, patient aware she may purchase meals to last longer but indicates this is out of her budget Discussed once patient is feeling better she can begin accessing church food pantry close to her home Patient reports she did go to her primary care provider recently and was diagnosed with a UTI, patient has not yet picked up antibiotic but plans to soon Patient is concerned with upcomming copay amounts needed to see her specialists - no resources available to assist with copay amounts SW will follow up with the patient over the next month        SDOH assessments and interventions completed:  No     Care Coordination Interventions:  Yes, provided   Interventions Today    Flowsheet Row Most Recent Value  Chronic Disease   Chronic disease during today's visit Diabetes, Hypertension (HTN)  General Interventions   General Interventions Discussed/Reviewed General Interventions Reviewed, Doctor Visits, Community Resources  Doctor Visits Discussed/Reviewed PCP, Doctor Visits Discussed        Follow up plan:  SW will continue to follow    Encounter Outcome:  Pt. Visit Completed   Daneen Schick, Arita Miss, CDP Social Worker, Certified Dementia Practitioner [facility] Management  Care Coordination [phone]

## 2022-11-20 DIAGNOSIS — M5416 Radiculopathy, lumbar region: Secondary | ICD-10-CM | POA: Diagnosis not present

## 2022-11-20 DIAGNOSIS — M5136 Other intervertebral disc degeneration, lumbar region: Secondary | ICD-10-CM | POA: Diagnosis not present

## 2022-11-20 DIAGNOSIS — M5412 Radiculopathy, cervical region: Secondary | ICD-10-CM | POA: Diagnosis not present

## 2022-11-20 DIAGNOSIS — M5030 Other cervical disc degeneration, unspecified cervical region: Secondary | ICD-10-CM | POA: Diagnosis not present

## 2022-11-29 ENCOUNTER — Ambulatory Visit: Payer: Self-pay

## 2022-11-29 NOTE — Patient Outreach (Signed)
  Care Coordination   11/29/2022 Name: Hayley [name] MRN: [id number] DOB: 1946-03-23   Care Coordination Outreach Attempts:  An unsuccessful telephone outreach was attempted today to offer the patient information about available care coordination services as a benefit of their health plan.   Follow Up Plan:  Additional outreach attempts will be made to offer the patient care coordination information and services.   Encounter Outcome:  No Answer   Care Coordination Interventions:  No, not indicated    Jone Baseman, RN, MSN [facility] Care Management Coordinator Direct Line [phone]

## 2022-12-05 ENCOUNTER — Telehealth: Payer: Self-pay | Admitting: *Deleted

## 2022-12-05 NOTE — Progress Notes (Signed)
  Care Coordination Note  12/05/2022 Name: Hayley [name] MRN: [id number] DOB: [birth year]  Hayley [name] is a 77 y.o. year old female who is a primary care patient of Jonathon Jordan, MD and is actively engaged with the care management team. I reached out to [name] by phone today to assist with re-scheduling a follow up visit with the RN Case Manager  Follow up plan: Unsuccessful telephone outreach attempt made. A HIPAA compliant phone message was left for the patient providing contact information and requesting a return call.   Julian Hy, [place] Direct Dial: [phone]

## 2022-12-17 ENCOUNTER — Telehealth: Payer: Self-pay

## 2022-12-17 NOTE — Patient Outreach (Addendum)
  Care Coordination   12/17/2022 Name: Hayley [name] MRN: [id number] DOB: November 08, 1945   Care Coordination Outreach Attempts:  An unsuccessful telephone outreach was attempted for a scheduled appointment today.  Follow Up Plan:  Additional outreach attempts will be made to offer the patient care coordination information and services.   Encounter Outcome:  No Answer   Care Coordination Interventions:  No, not indicated    Bevelyn Ngo, BSW, CDP Social Worker, Certified Dementia Practitioner [facility] Care Management  Care Coordination [phone]

## 2022-12-18 NOTE — Progress Notes (Signed)
  Care Coordination Note  12/18/2022 Name: Hayley [name] MRN: [id number] DOB: 1946-03-22  Hayley [name] is a 77 y.o. year old female who is a primary care patient of Jonathon Jordan, MD and is actively engaged with the care management team. I reached out to [name] by phone today to assist with re-scheduling a follow up visit with the RN Case Manager  Follow up plan: We have been unable to make contact with the patient for follow up.   Julian Hy, [place] Direct Dial: [phone]

## 2022-12-20 ENCOUNTER — Ambulatory Visit: Payer: Medicare Other | Admitting: Neurology

## 2022-12-21 ENCOUNTER — Telehealth: Payer: Self-pay

## 2022-12-21 DIAGNOSIS — R300 Dysuria: Secondary | ICD-10-CM | POA: Diagnosis not present

## 2022-12-21 DIAGNOSIS — R059 Cough, unspecified: Secondary | ICD-10-CM | POA: Diagnosis not present

## 2022-12-21 DIAGNOSIS — R109 Unspecified abdominal pain: Secondary | ICD-10-CM | POA: Diagnosis not present

## 2022-12-21 DIAGNOSIS — J45909 Unspecified asthma, uncomplicated: Secondary | ICD-10-CM | POA: Diagnosis not present

## 2022-12-21 DIAGNOSIS — E1169 Type 2 diabetes mellitus with other specified complication: Secondary | ICD-10-CM | POA: Diagnosis not present

## 2022-12-21 DIAGNOSIS — Z03818 Encounter for observation for suspected exposure to other biological agents ruled out: Secondary | ICD-10-CM | POA: Diagnosis not present

## 2022-12-21 DIAGNOSIS — E538 Deficiency of other specified B group vitamins: Secondary | ICD-10-CM | POA: Diagnosis not present

## 2022-12-21 NOTE — Patient Outreach (Signed)
  Care Coordination   12/21/2022 Name: DRINDA HASBERRY MRN: [id number] DOB: 1946-06-16   Care Coordination Outreach Attempts:  SW placed a second unsuccessful outbound call to the patient to assess for goal progression. Voice message left requesting a return call.  Follow Up Plan:  Additional outreach attempts will be made to offer the patient care coordination information and services.   Encounter Outcome:  No Answer   Care Coordination Interventions:  No, not indicated    Bevelyn Ngo, BSW, CDP Social Worker, Certified Dementia Practitioner [facility] Care Management  Care Coordination [phone]

## 2022-12-25 ENCOUNTER — Telehealth: Payer: Self-pay

## 2022-12-25 ENCOUNTER — Ambulatory Visit (INDEPENDENT_AMBULATORY_CARE_PROVIDER_SITE_OTHER): Payer: Medicare Other | Admitting: Neurology

## 2022-12-25 VITALS — BP 89/52 | HR 56 | Ht 65.0 in | Wt 175.0 lb

## 2022-12-25 DIAGNOSIS — K760 Fatty (change of) liver, not elsewhere classified: Secondary | ICD-10-CM | POA: Diagnosis not present

## 2022-12-25 DIAGNOSIS — Z86010 Personal history of colonic polyps: Secondary | ICD-10-CM | POA: Diagnosis not present

## 2022-12-25 DIAGNOSIS — K219 Gastro-esophageal reflux disease without esophagitis: Secondary | ICD-10-CM | POA: Diagnosis not present

## 2022-12-25 DIAGNOSIS — K5730 Diverticulosis of large intestine without perforation or abscess without bleeding: Secondary | ICD-10-CM | POA: Diagnosis not present

## 2022-12-25 DIAGNOSIS — G43709 Chronic migraine without aura, not intractable, without status migrainosus: Secondary | ICD-10-CM | POA: Diagnosis not present

## 2022-12-25 DIAGNOSIS — R194 Change in bowel habit: Secondary | ICD-10-CM | POA: Diagnosis not present

## 2022-12-25 MED ORDER — ONABOTULINUMTOXINA 200 UNITS IJ SOLR
155.0000 [IU] | INTRAMUSCULAR | Status: AC
  Administered 2022-12-25: 155 [IU] via INTRAMUSCULAR

## 2022-12-25 NOTE — Progress Notes (Signed)
Botox 200 x 1 vial  NDC: 0023-3921-02 LOT#: [id number] EXP: 2026/06  B/B  Witnessed by Florentina Addison.

## 2022-12-25 NOTE — Telephone Encounter (Signed)
I spoke with Margie Ege, NP regarding patient's low blood pressure readings during the visit. She was held in office for approximately one hour until vitals stabilized. She was provided with water and juice. She denies any dizziness. I walked the patient to her car. She was able to ambulate well. She was advised to monitor BP and contact us with any concerns.

## 2022-12-25 NOTE — Progress Notes (Signed)
   BOTOX PROCEDURE NOTE FOR MIGRAINE HEADACHE  HISTORY: Here today for Botox, last was in June 27, 2022, we got delayed due to her having COVID in January/scheduling issue. Has long COVID symptoms, headache daily since. Has been nauseated, dizzy, worsening tremor these are random. Takes Nurtec for migraines. Remains on propranolol ER 120 mg. She only gets Nurtec 8 tablets a month. We will use her numbing spray she provides.   Description of procedure:  The patient was placed in a sitting position. The standard protocol was used for Botox as follows, with 5 units of Botox injected at each site:  -Procerus muscle, midline injection  -Corrugator muscle, bilateral injection  -Frontalis muscle, bilateral injection, with 2 sites each side, medial injection was performed in the upper one third of the frontalis muscle, in the region vertical from the medial inferior edge of the superior orbital rim. The lateral injection was again in the upper one third of the forehead vertically above the lateral limbus of the cornea, 1.5 cm lateral to the medial injection site.  -Temporalis muscle injection, 4 sites, bilaterally. The first injection was 3 cm above the tragus of the ear, second injection site was 1.5 cm to 3 cm up from the first injection site in line with the tragus of the ear. The third injection site was 1.5-3 cm forward between the first 2 injection sites. The fourth injection site was 1.5 cm posterior to the second injection site.  -Occipitalis muscle injection, 3 sites, bilaterally. The first injection was done one half way between the occipital protuberance and the tip of the mastoid process behind the ear. The second injection site was done lateral and superior to the first, 1 fingerbreadth from the first injection. The third injection site was 1 fingerbreadth superiorly and medially from the first injection site.  -Cervical paraspinal muscle injection, 2 sites, bilateral, the first injection  site was 1 cm from the midline of the cervical spine, 3 cm inferior to the lower border of the occipital protuberance. The second injection site was 1.5 cm superiorly and laterally to the first injection site.  -Trapezius muscle injection was performed at 3 sites, bilaterally. The first injection site was in the upper trapezius muscle halfway between the inflection point of the neck, and the acromion. The second injection site was one half way between the acromion and the first injection site. The third injection was done between the first injection site and the inflection point of the neck.   A 200 unit bottle of Botox was used, 155 units were injected, the rest of the Botox was wasted. The patient tolerated the procedure well, there were no complications of the above procedure.  Botox NDC 2355-7322-02 Lot number [id number] Expiration date 02/2025 BB  She did have some low blood pressure that she reports has been baseline.  She claims that she feels fine, has been asymptomatic.  She had water and juice at our office.  Did not feel dizzy or lightheaded.  Her blood pressure did increase.  She left with strict precautions to monitor her condition closely, follow-up with her doctor. I called he after she left and she was going to the GI doctor and said she feels fine.

## 2022-12-27 ENCOUNTER — Telehealth: Payer: Self-pay

## 2022-12-27 NOTE — Patient Outreach (Signed)
  Care Coordination   12/27/2022 Name: Hayley [name] MRN: [id number] DOB: 1946-01-15   Care Coordination Outreach Attempts:  SW placed a third unsuccessful outbound call to the patient to assess for ongoing care coordination needs. SW left a HIPAA compliant voice message requesting a return call.  Follow Up Plan:  No further follow up planned at this time. Patient will remain engaged with RN Care Manager.  Encounter Outcome:  No Answer   Care Coordination Interventions:  No, not indicated    Bevelyn Ngo, BSW, CDP Social Worker, Certified Dementia Practitioner [facility] Care Management  Care Coordination [phone]

## 2022-12-28 DIAGNOSIS — R300 Dysuria: Secondary | ICD-10-CM | POA: Diagnosis not present

## 2022-12-31 ENCOUNTER — Encounter (HOSPITAL_COMMUNITY): Payer: Self-pay | Admitting: Psychiatry

## 2022-12-31 ENCOUNTER — Telehealth (HOSPITAL_BASED_OUTPATIENT_CLINIC_OR_DEPARTMENT_OTHER): Payer: Medicare Other | Admitting: Psychiatry

## 2022-12-31 DIAGNOSIS — F419 Anxiety disorder, unspecified: Secondary | ICD-10-CM | POA: Diagnosis not present

## 2022-12-31 DIAGNOSIS — F322 Major depressive disorder, single episode, severe without psychotic features: Secondary | ICD-10-CM | POA: Diagnosis not present

## 2022-12-31 MED ORDER — VORTIOXETINE HBR 20 MG PO TABS: 20.0000 mg | ORAL_TABLET | Freq: Every day | ORAL | 0 refills | Status: DC

## 2022-12-31 MED ORDER — TRAZODONE HCL 150 MG PO TABS: 150.0000 mg | ORAL_TABLET | Freq: Every day | ORAL | 0 refills | Status: DC

## 2022-12-31 MED ORDER — LORAZEPAM 0.5 MG PO TABS: 0.5000 mg | ORAL_TABLET | Freq: Every day | ORAL | 0 refills | Status: DC | PRN

## 2022-12-31 NOTE — Progress Notes (Signed)
Health MD Virtual Progress Note   Patient Location: Home Provider Location: Home Office  I connect with patient by telephone and verified that I am speaking with correct person by using two identifiers. I discussed the limitations of evaluation and management by telemedicine and the availability of in person appointments. I also discussed with the patient that there may be a patient responsible charge related to this service. The patient expressed understanding and agreed to proceed.  Hayley [name] [id number] 77 y.o.  12/31/2022 3:06 PM  History of Present Illness:  Patient is evaluated by phone session.  She reported to still sometimes feeling tired, fatigued after a COVID symptoms that happen in January.  She is slowly and gradually getting better we had increased the Trintellix 20 mg and she noticed improvement in her mood depression.  She is sleeping better.  She still had tremors, chronic fatigue but manageable.  She recently had a visit to neurology for her headaches and she feels the Botox does help her a lot.  However now she worried about her blood pressure and recently had a neurology visit her blood pressure was low.  She has appointment coming up with her PCP to discuss about her blood pressure.  She does not want to change the medication since he noticed the combination of trazodone and Trintellix helping her mood.  She has taken few times lorazepam to help with anxiety.  She denies any suicidal thoughts.  She admitted to still remember her husband who died 4 years ago.  Now she is spending more time in the backyard and she recall the time she spent with her husband at her backyard.  She denies any paranoia or any hallucination.  Past Psychiatric History: H/O depression and anxiety.  H/O suicidal attempt on Percocet in March 2019.  Took Effexor in her 20s but stopped after working for a while.  Tried Zoloft, Paxil and Wellbutrin from PCP but did not work. Did IOP, PHP  and given Prozac but no improvement. Tried lexapro caused GI s/e, Lamictal caused increase tremors.     Outpatient Encounter Medications as of 12/31/2022  Medication Sig   albuterol (PROVENTIL) (2.5 MG/3ML) 0.083% nebulizer solution Take 2.5 mg by nebulization every 4 (four) hours as needed for wheezing or shortness of breath.   apixaban (ELIQUIS) 2.5 MG TABS tablet Take 1 tablet (2.5 mg total) by mouth 2 (two) times daily.   clindamycin (CLEOCIN) 300 MG capsule Take 600 mg by mouth once.   diclofenac (FLECTOR) 1.3 % PTCH Place 1 patch onto the skin daily. (Patient taking differently: Place 1 patch onto the skin daily as needed (Joint and muscle pain).)   HYDROcodone-acetaminophen (NORCO) 10-325 MG tablet Take 1 tablet by mouth every 6 (six) hours.   hydrOXYzine (ATARAX/VISTARIL) 25 MG tablet Take 1 tablet (25 mg total) by mouth 3 (three) times daily as needed for anxiety, itching or nausea.   hyoscyamine (ANASPAZ) 0.125 MG TBDP disintergrating tablet Place 0.125 mg under the tongue every 4 (four) hours as needed for cramping (Disgestion).   ipratropium (ATROVENT) 0.03 % nasal spray Place 2 sprays into both nostrils every 12 (twelve) hours.   Ipratropium-Albuterol (COMBIVENT IN) Inhale 1 application. into the lungs daily as needed (Wheezing).   levothyroxine (SYNTHROID, LEVOTHROID) 25 MCG tablet Take 25 mcg by mouth every morning.    LORazepam (ATIVAN) 0.5 MG tablet Take 1 tablet (0.5 mg total) by mouth daily as needed for anxiety.   mometasone-formoterol (DULERA) 100-5 MCG/ACT AERO Inhale  2 puffs into the lungs 2 (two) times daily as needed for wheezing or shortness of breath.   nystatin (MYCOSTATIN) 100000 UNIT/ML suspension Use as directed in the mouth or throat daily as needed (Thrush).   promethazine-dextromethorphan (PROMETHAZINE-DM) 6.25-15 MG/5ML syrup TAKE 5 MILLILITERS BY MOUTH EVERY 6 HOURS AS NEEDED   propranolol (INDERAL) 10 MG tablet Take 1 tablet (10 mg total) by mouth 2 (two) times  daily as needed. (Patient taking differently: Take 10 mg by mouth 2 (two) times daily as needed. 30 in before going out)   propranolol ER (INDERAL LA) 120 MG 24 hr capsule Take 1 capsule (120 mg total) by mouth daily.   Rimegepant Sulfate (NURTEC) 75 MG TBDP Take 1 tab at onset of migraine.  May repeat in 2 hrs, if needed.  Max dose: 2 tabs/day. This is a 30 day prescription.   simethicone (MYLICON) 80 MG chewable tablet Chew 1 tablet (80 mg total) by mouth 4 (four) times daily as needed for flatulence (belching).   tiZANidine (ZANAFLEX) 4 MG tablet Take 4 mg by mouth at bedtime as needed for muscle spasms.   traZODone (DESYREL) 150 MG tablet Take 1 tablet (150 mg total) by mouth at bedtime.   valACYclovir (VALTREX) 1000 MG tablet Take 2,000 mg by mouth 2 (two) times daily as needed (Fever blisters). Reported on 01/10/2016   vortioxetine HBr (TRINTELLIX) 20 MG TABS tablet Take 1 tablet (20 mg total) by mouth daily.   Facility-Administered Encounter Medications as of 12/31/2022  Medication   botulinum toxin Type A (BOTOX) injection 155 Units    Recent Results (from the past 2160 hour(s))  Resp panel by RT-PCR (RSV, Flu A&B, Covid) Anterior Nasal Swab     Status: Abnormal   Collection Time: 10/10/22  6:20 PM   Specimen: Anterior Nasal Swab  Result Value Ref Range   SARS Coronavirus 2 by RT PCR POSITIVE (A) NEGATIVE    Comment: (NOTE) SARS-CoV-2 target nucleic acids are DETECTED.  The SARS-CoV-2 RNA is generally detectable in upper respiratory specimens during the acute phase of infection. Positive results are indicative of the presence of the identified virus, but do not rule out bacterial infection or co-infection with other pathogens not detected by the test. Clinical correlation with patient history and other diagnostic information is necessary to determine patient infection status. The expected result is Negative.  Fact Sheet for  Patients: BloggerCourse.com  Fact Sheet for Healthcare Providers: SeriousBroker.it  This test is not yet approved or cleared by the [place] FDA and  has been authorized for detection and/or diagnosis of SARS-CoV-2 by FDA under an Emergency Use Authorization (EUA).  This EUA will remain in effect (meaning this test can be used) for the duration of  the COVID-19 declaration under Section 564(b)(1) of the A ct, 21 U.S.C. section 360bbb-3(b)(1), unless the authorization is terminated or revoked sooner.     Influenza A by PCR NEGATIVE NEGATIVE   Influenza B by PCR NEGATIVE NEGATIVE    Comment: (NOTE) The Xpert Xpress SARS-CoV-2/FLU/RSV plus assay is intended as an aid in the diagnosis of influenza from Nasopharyngeal swab specimens and should not be used as a sole basis for treatment. Nasal washings and aspirates are unacceptable for Xpert Xpress SARS-CoV-2/FLU/RSV testing.  Fact Sheet for Patients: BloggerCourse.com  Fact Sheet for Healthcare Providers: SeriousBroker.it  This test is not yet approved or cleared by the [place] FDA and has been authorized for detection and/or diagnosis of SARS-CoV-2 by FDA under an Emergency Use  Authorization (EUA). This EUA will remain in effect (meaning this test can be used) for the duration of the COVID-19 declaration under Section 564(b)(1) of the Act, 21 U.S.C. section 360bbb-3(b)(1), unless the authorization is terminated or revoked.     Resp Syncytial Virus by PCR NEGATIVE NEGATIVE    Comment: (NOTE) Fact Sheet for Patients: BloggerCourse.com  Fact Sheet for Healthcare Providers: SeriousBroker.it  This test is not yet approved or cleared by the [place] FDA and has been authorized for detection and/or diagnosis of SARS-CoV-2 by FDA under an Emergency Use Authorization  (EUA). This EUA will remain in effect (meaning this test can be used) for the duration of the COVID-19 declaration under Section 564(b)(1) of the Act, 21 U.S.C. section 360bbb-3(b)(1), unless the authorization is terminated or revoked.  Performed at [facility], [address] [place], [place] [address]   CBC     Status: Abnormal   Collection Time: 10/10/22  6:20 PM  Result Value Ref Range   WBC 4.3 4.0 - 10.5 K/uL   RBC 4.12 3.87 - 5.11 MIL/uL   Hemoglobin 10.5 (L) 12.0 - 15.0 g/dL   HCT 11.9 (L) 14.7 - 82.9 %   MCV 83.0 80.0 - 100.0 fL   MCH 25.5 (L) 26.0 - 34.0 pg   MCHC 30.7 30.0 - 36.0 g/dL   RDW 56.2 13.0 - 86.5 %   Platelets 190 150 - 400 K/uL   nRBC 0.0 0.0 - 0.2 %    Comment: Performed at [facility], [address], [place], [place] [address]  Comprehensive metabolic panel     Status: Abnormal   Collection Time: 10/10/22  6:20 PM  Result Value Ref Range   Sodium 137 135 - 145 mmol/L   Potassium 2.9 (L) 3.5 - 5.1 mmol/L   Chloride 106 98 - 111 mmol/L   CO2 24 22 - 32 mmol/L   Glucose, Bld 85 70 - 99 mg/dL    Comment: Glucose reference range applies only to samples taken after fasting for at least 8 hours.   BUN 9 8 - 23 mg/dL   Creatinine, Ser 6.29 0.44 - 1.00 mg/dL   Calcium 6.7 (L) 8.9 - 10.3 mg/dL   Total Protein 5.4 (L) 6.5 - 8.1 g/dL   Albumin 2.7 (L) 3.5 - 5.0 g/dL   AST 18 15 - 41 U/L   ALT 7 0 - 44 U/L   Alkaline Phosphatase 30 (L) 38 - 126 U/L   Total Bilirubin 0.5 0.3 - 1.2 mg/dL   GFR, Estimated >52 >84 mL/min    Comment: (NOTE) Calculated using the CKD-EPI Creatinine Equation (2021)    Anion gap 7 5 - 15    Comment: Performed at [facility], [address], [place], [place] [address]     Psychiatric Specialty Exam: Physical Exam  Review of Systems  Neurological:  Positive for tremors and headaches.    Weight 175 lb (79.4 kg), last menstrual period 09/18/1979.There is no height or  weight on file to calculate BMI.  General Appearance: NA  Eye Contact:  NA  Speech:  Slow and dysphonic  Volume:  Decreased  Mood:  Dysphoric  Affect:  NA  Thought Process:  Descriptions of Associations: Intact  Orientation:  Full (Time, Place, and Person)  Thought Content:  Rumination  Suicidal Thoughts:  No  Homicidal Thoughts:  No  Memory:  Immediate;   Good Recent;   Good Remote;   Fair  Judgement:  Intact  Insight:  Present  Psychomotor Activity:  Tremor  Concentration:  Concentration: Fair and Attention Span: Fair  Recall:  Good  Fund of Knowledge:  Good  Language:  Good  Akathisia:  No  Handed:  Right  AIMS (if indicated):     Assets:  Communication Skills Desire for Improvement Housing  ADL's:  Intact  Cognition:  WNL  Sleep:  ok     Assessment/Plan: MDD (major depressive episode), single episode, severe, no psychosis - Plan: traZODone (DESYREL) 150 MG tablet, vortioxetine HBr (TRINTELLIX) 20 MG TABS tablet  Anxiety - Plan: vortioxetine HBr (TRINTELLIX) 20 MG TABS tablet, LORazepam (ATIVAN) 0.5 MG tablet  I reviewed notes from other provider.  Her depression and anxiety is stable on current medication.  She like increased dose of Trintellix.  I encouraged keep appointment with primary care to address her blood pressure.  Discussed current medication side effects and benefits continue trazodone 150 mg at bedtime, Trintellix 20 mg daily and Ativan 0.5 mg to take as needed for severe anxiety.  Recommended to call us back if she has any question or any concern.  Follow-up in 3 months.   Follow Up Instructions:     I discussed the assessment and treatment plan with the patient. The patient was provided an opportunity to ask questions and all were answered. The patient agreed with the plan and demonstrated an understanding of the instructions.   The patient was advised to call back or seek an in-person evaluation if the symptoms worsen or if the condition fails to  improve as anticipated.    Collaboration of Care: Other provider involved in patient's care AEB notes are available in epic to review.  Patient/Guardian was advised Release of Information must be obtained prior to any record release in order to collaborate their care with an outside provider. Patient/Guardian was advised if they have not already done so to contact the registration department to sign all necessary forms in order for [place] to release information regarding their care.   Consent: Patient/Guardian gives verbal consent for treatment and assignment of benefits for services provided during this visit. Patient/Guardian expressed understanding and agreed to proceed.     I provided 19 minutes of non face to face time during this encounter.  Note: This document was prepared by [employer] voice dictation technology and any errors that results from this process are unintentional.    Cleotis Nipper, MD 12/31/2022

## 2023-01-07 DIAGNOSIS — M1812 Unilateral primary osteoarthritis of first carpometacarpal joint, left hand: Secondary | ICD-10-CM | POA: Diagnosis not present

## 2023-01-28 ENCOUNTER — Other Ambulatory Visit: Payer: Self-pay | Admitting: Pulmonary Disease

## 2023-01-28 DIAGNOSIS — U071 COVID-19: Secondary | ICD-10-CM

## 2023-02-10 ENCOUNTER — Other Ambulatory Visit: Payer: Self-pay | Admitting: Pulmonary Disease

## 2023-02-10 DIAGNOSIS — R053 Chronic cough: Secondary | ICD-10-CM

## 2023-02-12 NOTE — Telephone Encounter (Signed)
Please advise on refill request

## 2023-02-15 MED ORDER — PROMETHAZINE-DM 6.25-15 MG/5ML PO SYRP: ORAL_SOLUTION | ORAL | 1 refills | Status: DC

## 2023-02-26 ENCOUNTER — Telehealth: Payer: Self-pay | Admitting: Neurology

## 2023-02-26 NOTE — Telephone Encounter (Signed)
Reached out to pt asking for new ins information, BCBS supplement is coming up as invalid.

## 2023-03-04 DIAGNOSIS — M1812 Unilateral primary osteoarthritis of first carpometacarpal joint, left hand: Secondary | ICD-10-CM | POA: Diagnosis not present

## 2023-03-12 NOTE — Telephone Encounter (Signed)
Pt has not responded to MyChart messages, I called and left her a VM asking for new insurance info.

## 2023-03-27 ENCOUNTER — Ambulatory Visit (INDEPENDENT_AMBULATORY_CARE_PROVIDER_SITE_OTHER): Payer: Medicare Other | Admitting: Neurology

## 2023-03-27 VITALS — BP 94/55 | HR 63 | Ht 66.0 in

## 2023-03-27 DIAGNOSIS — G43709 Chronic migraine without aura, not intractable, without status migrainosus: Secondary | ICD-10-CM | POA: Diagnosis not present

## 2023-03-27 MED ORDER — ONABOTULINUMTOXINA 200 UNITS IJ SOLR
155.0000 [IU] | Freq: Once | INTRAMUSCULAR | Status: AC
  Administered 2023-03-27: 155 [IU] via INTRAMUSCULAR

## 2023-03-27 NOTE — Telephone Encounter (Signed)
I was able to reach the patient before her appointment this morning. She states that she only has Medicare A+B. No auth required and will be buy/bill.

## 2023-03-27 NOTE — Progress Notes (Addendum)
Patient: Hayley [name] Date of Birth: Apr 29, 1946  Reason for Visit: Follow up & Botox History from: Patient Primary Neurologist: [place] [place]  ASSESSMENT AND PLAN 77 y.o. year old female   1.  Chronic migraine headache -Continue Botox injection every 3 months for chronic migraine headache, continues to have excellent benefit  2.  Essential tremor -Has seen Dr. Tempie Donning for consultation for intervention options, for now we will continue propranolol LA 120 mg daily, we discussed reducing due to BP, she wishes to keep dose as is due to tremor help  HISTORY OF PRESENT ILLNESS: Today 03/27/23 Here for Botox, last injection by me 12/25/22. Botox very helpful, feels it wearing off. Takes Nurtec once a week. Remains on propranolol LA 120 mg for tremor. Still recovering from long COVID. Her essential tremor is bothersome with fine motor skills, I don't see any tremor today. Has spasmodic dysphonia. Has has seen Dr. Tempie Donning, may be a candidate for magnetic procedure.    12/25/22 SS: HISTORY: Here today for Botox, last was in June 27, 2022, we got delayed due to her having COVID in January/scheduling issue. Has long COVID symptoms, headache daily since. Has been nauseated, dizzy, worsening tremor these are random. Takes Nurtec for migraines. Remains on propranolol ER 120 mg. She only gets Nurtec 8 tablets a month. We will use her numbing spray she provides.   REVIEW OF SYSTEMS: Out of a complete 14 system review of symptoms, the patient complains only of the following symptoms, and all other reviewed systems are negative.  See HPI  ALLERGIES: Allergies  Allergen Reactions   Bee Venom Anaphylaxis   Morphine Other (See Comments)    Hallucinations   Morphine And Codeine    Tape Rash    Rash - only use paper tape   Etodolac Diarrhea and Nausea And Vomiting   Other Itching and Swelling    Bee venom   Penicillins Rash    Has patient had a PCN reaction causing immediate rash, facial/tongue/throat  swelling, SOB or lightheadedness with hypotension: Yes Has patient had a PCN reaction causing severe rash involving mucus membranes or skin necrosis: No Has patient had a PCN reaction that required hospitalization No Has patient had a PCN reaction occurring within the last 10 years: No If all of the above answers are "NO", then may proceed with Cephalosporin use.    Poison Ivy Extract  [Poison Ivy Extract] Itching and Swelling   Propoxyphene Nausea And Vomiting   Propoxyphene N-Acetaminophen Nausea And Vomiting    HOME MEDICATIONS: Outpatient Medications Prior to Visit  Medication Sig Dispense Refill   albuterol (PROVENTIL) (2.5 MG/3ML) 0.083% nebulizer solution Take 2.5 mg by nebulization every 4 (four) hours as needed for wheezing or shortness of breath.     apixaban (ELIQUIS) 2.5 MG TABS tablet Take 1 tablet (2.5 mg total) by mouth 2 (two) times daily. 180 tablet 4   clindamycin (CLEOCIN) 300 MG capsule Take 600 mg by mouth once.     diclofenac (FLECTOR) 1.3 % PTCH Place 1 patch onto the skin daily. (Patient taking differently: Place 1 patch onto the skin daily as needed (Joint and muscle pain).) 20 patch 1   HYDROcodone-acetaminophen (NORCO) 10-325 MG tablet Take 1 tablet by mouth every 6 (six) hours.     hydrOXYzine (ATARAX/VISTARIL) 25 MG tablet Take 1 tablet (25 mg total) by mouth 3 (three) times daily as needed for anxiety, itching or nausea. 30 tablet 0   hyoscyamine (ANASPAZ) 0.125 MG TBDP disintergrating tablet  Place 0.125 mg under the tongue every 4 (four) hours as needed for cramping (Disgestion).     ipratropium (ATROVENT) 0.03 % nasal spray Place 2 sprays into both nostrils every 12 (twelve) hours. 30 mL 12   Ipratropium-Albuterol (COMBIVENT IN) Inhale 1 application. into the lungs daily as needed (Wheezing).     levothyroxine (SYNTHROID, LEVOTHROID) 25 MCG tablet Take 25 mcg by mouth every morning.      LORazepam (ATIVAN) 0.5 MG tablet Take 1 tablet (0.5 mg total) by mouth  daily as needed for anxiety. 15 tablet 0   mometasone-formoterol (DULERA) 100-5 MCG/ACT AERO Inhale 2 puffs into the lungs 2 (two) times daily as needed for wheezing or shortness of breath. 1 each 6   nystatin (MYCOSTATIN) 100000 UNIT/ML suspension Use as directed in the mouth or throat daily as needed (Thrush).     promethazine-dextromethorphan (PROMETHAZINE-DM) 6.25-15 MG/5ML syrup TAKE 5 MILLILITERS BY MOUTH EVERY 6 HOURS AS NEEDED 118 mL 1   propranolol (INDERAL) 10 MG tablet Take 1 tablet (10 mg total) by mouth 2 (two) times daily as needed. (Patient taking differently: Take 10 mg by mouth 2 (two) times daily as needed. 30 in before going out) 60 tablet 11   propranolol ER (INDERAL LA) 120 MG 24 hr capsule Take 1 capsule (120 mg total) by mouth daily. 90 capsule 3   Rimegepant Sulfate (NURTEC) 75 MG TBDP Take 1 tab at onset of migraine.  May repeat in 2 hrs, if needed.  Max dose: 2 tabs/day. This is a 30 day prescription. 8 tablet 5   simethicone (MYLICON) 80 MG chewable tablet Chew 1 tablet (80 mg total) by mouth 4 (four) times daily as needed for flatulence (belching). 30 tablet 0   tiZANidine (ZANAFLEX) 4 MG tablet Take 4 mg by mouth at bedtime as needed for muscle spasms.     traZODone (DESYREL) 150 MG tablet Take 1 tablet (150 mg total) by mouth at bedtime. 90 tablet 0   valACYclovir (VALTREX) 1000 MG tablet Take 2,000 mg by mouth 2 (two) times daily as needed (Fever blisters). Reported on 01/10/2016  2   vortioxetine HBr (TRINTELLIX) 20 MG TABS tablet Take 1 tablet (20 mg total) by mouth daily. 90 tablet 0   Facility-Administered Medications Prior to Visit  Medication Dose Route Frequency Provider Last Rate Last Admin   botulinum toxin Type A (BOTOX) injection 155 Units  155 Units Intramuscular Q90 days Glean Salvo, NP   155 Units at 12/25/22 0954    PAST MEDICAL HISTORY: Past Medical History:  Diagnosis Date   Anemia    Anxiety    Bronchitis    hx of    Clotting disorder (HCC)  02-17-14   S/p appendectomy-developed Pulmonary emboli-tx. warfarin-d/c 1 yr ago.   Colon polyps    Depression    Diabetes mellitus without complication (HCC)    "borderline"- oral med   Disorder of vocal cords    spasmotic dysphonia ,02-17-14 has" whispery voice-low tone"   Hyperthyroidism    Kidney stones    Migraines    Nausea Nov 15, 2014   cycle of migraine headaches and nausea   Neuromuscular disorder (HCC)    hands and throat"spasmodic dysphonia", tremors - thimb and forefinger    Obesity, Class III, BMI 40-49.9 (morbid obesity) (HCC) 04/27/2010   02-17-14 reports some weight loss- intentional   Osteoarthritis    in back(chronic pain)   Pelvic pain    02-17-14 "states thinks its back related on left  groin"   Pulmonary emboli [facility])    s/p Appendectomy '13 [facility]   Reflux    Right knee pain    Sleep apnea    cpap - settings at 4    Toxic goiter     PAST SURGICAL HISTORY: Past Surgical History:  Procedure Laterality Date   ABDOMINAL HYSTERECTOMY     APPENDECTOMY     3'13 [facility] s/p developed  Pulmonary Emboli   AUGMENTATION MAMMAPLASTY  09/17/1968   BOTOX INJECTION     for migranes   CATARACT EXTRACTION, BILATERAL Bilateral 03/2013    CHOLECYSTECTOMY     COLONOSCOPY WITH PROPOFOL N/A 12/15/2021   Procedure: COLONOSCOPY WITH PROPOFOL;  Surgeon: Jeani Hawking, MD;  Location: [facility];  Service: Gastroenterology;  Laterality: N/A;   HAND SURGERY  2004   both hands   HEMOSTASIS CLIP PLACEMENT  12/15/2021   Procedure: HEMOSTASIS CLIP PLACEMENT;  Surgeon: Jeani Hawking, MD;  Location: [facility];  Service: Gastroenterology;;   KIDNEY STONE SURGERY     KNEE ARTHROSCOPY  1998   right   LAPAROSCOPIC APPENDECTOMY  11/27/2011   Procedure: APPENDECTOMY LAPAROSCOPIC;  Surgeon: Ardeth Sportsman, MD;  Location: [facility];  Service: General;  Laterality: N/A;   POLYPECTOMY  12/15/2021   Procedure: POLYPECTOMY;  Surgeon: Jeani Hawking, MD;  Location: [facility];  Service:  Gastroenterology;;   SPINAL CORD STIMULATOR IMPLANT  06/04/2016   Dr. Frederica Kuster at [facility]   TOTAL KNEE ARTHROPLASTY Right 12/06/2014   Procedure: RIGHT TOTAL KNEE ARTHROPLASTY;  Surgeon: Ollen Gross, MD;  Location: [facility];  Service: Orthopedics;  Laterality: Right;   TOTAL KNEE REVISION Left 02/24/2014   Procedure: LEFT TOTAL KNEE REVISION;  Surgeon: Loanne Drilling, MD;  Location: [facility];  Service: Orthopedics;  Laterality: Left;   vocal cord surgery  02-17-14   01-12-14 done in [place] [facility](s/p selective denervation/reinnervation recurrent laryngeal nerve surgery)   zenkers diverticulum      FAMILY HISTORY: Family History  Problem Relation Age of Onset   Heart disease Mother    Pneumonia Father    Cirrhosis Father    Hypertension Father    Alcoholism Father    Heart attack Paternal Grandfather    Heart attack Maternal Grandfather    Heart attack Paternal Grandmother    Heart attack Maternal Grandmother    Breast cancer Daughter     SOCIAL HISTORY: Social History   Socioeconomic History   Marital status: Widowed    Spouse name: Not on file   Number of children: 5   Years of education: 34   Highest education level: Not on file  Occupational History   Occupation: Retired  Tobacco Use   Smoking status: Former    Packs/day: 0.25    Years: 5.00    Additional pack years: 0.00    Total pack years: 1.25    Types: Cigarettes    Quit date: 01/03/1982    Years since quitting: 41.2   Smokeless tobacco: Never  Vaping Use   Vaping Use: Never used  Substance and Sexual Activity   Alcohol use: No   Drug use: No   Sexual activity: Not Currently  Other Topics Concern   Not on file  Social History Narrative   Lives at home alone at this time.   Right-handed.   2 cups caffeine per day.   Social Determinants of Health   Financial Resource Strain: Medium Risk (01/21/2018)   Overall Financial Resource Strain (CARDIA)    Difficulty of  Paying Living Expenses:  Somewhat hard  Food Insecurity: Food Insecurity Present (09/07/2022)   Hunger Vital Sign    Worried About Running Out of Food in the Last Year: Sometimes true    Ran Out of Food in the Last Year: Sometimes true  Transportation Needs: Unmet Transportation Needs (09/06/2022)   PRAPARE - Transportation    Lack of Transportation (Medical): No    Lack of Transportation (Non-Medical): Yes  Physical Activity: Inactive (01/21/2018)   Exercise Vital Sign    Days of Exercise per Week: 0 days    Minutes of Exercise per Session: 0 min  Stress: Stress Concern Present (01/21/2018)   [employer] of Occupational Health - Occupational Stress Questionnaire    Feeling of Stress : Very much  Social Connections: Moderately Isolated (01/21/2018)   Social Connection and Isolation Panel [NHANES]    Frequency of Communication with Friends and Family: Once a week    Frequency of Social Gatherings with Friends and Family: Once a week    Attends Religious Services: More than 4 times per year    Active Member of [employer] or Organizations: No    Attends Banker Meetings: Never    Marital Status: Separated  Intimate Partner Violence: Not At Risk (01/21/2018)   Humiliation, Afraid, Rape, and Kick questionnaire    Fear of Current or Ex-Partner: No    Emotionally Abused: No    Physically Abused: No    Sexually Abused: No   PHYSICAL EXAM  Vitals:   03/27/23 1129  BP: (!) 94/55  Pulse: 63  Height: 5\' 6"  (1.676 m)   Body mass index is 28.25 kg/m.  Generalized: Well developed, in no acute distress  Neurological examination  Mentation: Alert oriented to time, place, history taking. Follows all commands, voice quality is shaky, muffled.  She is mildly anxious. Cranial nerve II-XII: Pupils were equal round reactive to light. Extraocular movements were full, visual field were full on confrontational test. Facial sensation and strength were normal.  Head turning and shoulder shrug  were normal and  symmetric. Motor: The motor testing reveals 5 over 5 strength of all 4 extremities. Good symmetric motor tone is noted throughout.  Sensory: Sensory testing is intact to soft touch on all 4 extremities. No evidence of extinction is noted.  Coordination: Cerebellar testing reveals good finger-nose-finger and heel-to-shin bilaterally.  No significant tremor was seen. Gait and station: Gait is normal.   DIAGNOSTIC DATA (LABS, IMAGING, TESTING) - I reviewed patient records, labs, notes, testing and imaging myself where available.  Lab Results  Component Value Date   WBC 4.3 10/10/2022   HGB 10.5 (L) 10/10/2022   HCT 34.2 (L) 10/10/2022   MCV 83.0 10/10/2022   PLT 190 10/10/2022      Component Value Date/Time   NA 137 10/10/2022 1820   NA 138 12/12/2016 1105   K 2.9 (L) 10/10/2022 1820   K 4.6 12/12/2016 1105   CL 106 10/10/2022 1820   CO2 24 10/10/2022 1820   CO2 25 12/12/2016 1105   GLUCOSE 85 10/10/2022 1820   GLUCOSE 234 (H) 12/12/2016 1105   BUN 9 10/10/2022 1820   BUN 8.0 12/12/2016 1105   CREATININE 0.65 10/10/2022 1820   CREATININE 0.84 12/25/2017 1217   CREATININE 0.8 12/12/2016 1105   CALCIUM 6.7 (L) 10/10/2022 1820   CALCIUM 9.4 12/12/2016 1105   PROT 5.4 (L) 10/10/2022 1820   PROT 7.5 12/12/2016 1105   ALBUMIN 2.7 (L) 10/10/2022 1820   ALBUMIN 3.7  12/12/2016 1105   AST 18 10/10/2022 1820   AST 17 12/25/2017 1217   AST 14 12/12/2016 1105   ALT 7 10/10/2022 1820   ALT 14 12/25/2017 1217   ALT 16 12/12/2016 1105   ALKPHOS 30 (L) 10/10/2022 1820   ALKPHOS 102 12/12/2016 1105   BILITOT 0.5 10/10/2022 1820   BILITOT 0.3 12/25/2017 1217   BILITOT 0.34 12/12/2016 1105   GFRNONAA >60 10/10/2022 1820   GFRNONAA >60 12/25/2017 1217   GFRAA >60 04/04/2019 0359   GFRAA >60 12/25/2017 1217   Lab Results  Component Value Date   CHOL 159 03/23/2016   HDL 31 (L) 03/23/2016   LDLCALC 93 03/23/2016   TRIG 122 11/17/2017   CHOLHDL 5.1 03/23/2016   Lab Results   Component Value Date   HGBA1C 6.4 (H) 04/02/2019   Lab Results  Component Value Date   VITAMINB12 876 11/17/2017   Lab Results  Component Value Date   TSH 1.348 11/17/2017    Margie Ege, AGNP-C, DNP 03/27/2023, 11:29 AM [facility] [address] [place], [place] [address] [phone]     Botox injection for chronic migraine prevention, injection was performed according to [place] protocol,  5 units of Botox was injected into each side, for 31 injection sites, total of 155 units  Bilateral frontalis 4 injection sites Bilateral corrugate 2 injection sites Procerus 1 injection sites. Bilateral temporalis 8 injection sites Bilateral occipitalis 6 injection sites Bilateral cervical paraspinals 4 injection sites Bilateral upper trapezius 6 injection sites  Extra 45 unites were wasted.     Addendum: Reviewed [facility] neurosurgeon Dr. Tempie Donning evaluations on June 09, 2023, decided to proceed with left VIM FUS thalamotomy, (FUS focused ultrasound)

## 2023-03-27 NOTE — Progress Notes (Signed)
Botox- 200 units x 1 vial Lot: [id number] Expiration: 08.2026 NDC: 0023-3921-02  Bacteriostatic 0.9% Sodium Chloride- 4 mL  Lot: [id number] Expiration: 11.01.2025 NDC: 1191478295  Dx: G43.709 B/B Witnessed by Aundra Millet, CMA

## 2023-04-01 ENCOUNTER — Encounter (HOSPITAL_COMMUNITY): Payer: Self-pay | Admitting: Psychiatry

## 2023-04-01 ENCOUNTER — Telehealth (HOSPITAL_COMMUNITY): Payer: Medicare Other | Admitting: Psychiatry

## 2023-04-01 VITALS — Wt 175.0 lb

## 2023-04-01 DIAGNOSIS — F419 Anxiety disorder, unspecified: Secondary | ICD-10-CM

## 2023-04-01 DIAGNOSIS — F322 Major depressive disorder, single episode, severe without psychotic features: Secondary | ICD-10-CM

## 2023-04-01 MED ORDER — VORTIOXETINE HBR 20 MG PO TABS: 20.0000 mg | ORAL_TABLET | Freq: Every day | ORAL | 0 refills | Status: DC

## 2023-04-01 MED ORDER — TRAZODONE HCL 150 MG PO TABS: 150.0000 mg | ORAL_TABLET | Freq: Every day | ORAL | 0 refills | Status: DC

## 2023-04-01 MED ORDER — LORAZEPAM 0.5 MG PO TABS: 0.5000 mg | ORAL_TABLET | Freq: Every day | ORAL | 0 refills | Status: DC | PRN

## 2023-04-01 NOTE — Progress Notes (Signed)
[facility] MD Virtual Progress Note   Patient Location: Home Provider Location: Home Office  I connect with patient by telephone and verified that I am speaking with correct person by using two identifiers. I discussed the limitations of evaluation and management by telemedicine and the availability of in person appointments. I also discussed with the patient that there may be a patient responsible charge related to this service. The patient expressed understanding and agreed to proceed.  Hayley [name] [id number] 77 y.o.  04/01/2023 2:26 PM  History of Present Illness:  Patient is evaluated by phone session.  She reported chronic fatigue which she noticed after the COVID symptoms in January.  Sometimes he does not have motivated but also reported extreme hot weather outside.  She is on nasal oxygen.  She used to take 2 L but after the COVID it is now 3 L.  She liked the combination of trazodone and Trintellix.  She denies any crying spells or any feeling of hopelessness or worthlessness.  She is seeing neurology for chronic tremors and headaches.  She denies any hallucination, paranoia, suicidal thoughts.  She does take Ativan as needed when she feels very nervous or anxious.  Sometimes she tends to forget to take the Ativan.  Her sleep is okay.  She reported an outdoor person but lately unable to go outside as much due to weather.  She enjoys watching tennis and sports on the TV.  She lives with her dog and cat.  She is hoping once weather get better she can enjoy her backyard.  She wants to keep the current medication.  Past Psychiatric History: H/O depression and anxiety.  H/O suicidal attempt on Percocet in March 2019.  Took Effexor in her 74s but stopped after working for a while.  Tried Zoloft, Paxil and Wellbutrin from PCP but did not work. Did IOP, PHP and given Prozac but no improvement. Tried lexapro caused GI s/e, Lamictal caused increase tremors.       Outpatient  Encounter Medications as of 04/01/2023  Medication Sig   albuterol (PROVENTIL) (2.5 MG/3ML) 0.083% nebulizer solution Take 2.5 mg by nebulization every 4 (four) hours as needed for wheezing or shortness of breath.   apixaban (ELIQUIS) 2.5 MG TABS tablet Take 1 tablet (2.5 mg total) by mouth 2 (two) times daily.   clindamycin (CLEOCIN) 300 MG capsule Take 600 mg by mouth once.   diclofenac (FLECTOR) 1.3 % PTCH Place 1 patch onto the skin daily. (Patient taking differently: Place 1 patch onto the skin daily as needed (Joint and muscle pain).)   HYDROcodone-acetaminophen (NORCO) 10-325 MG tablet Take 1 tablet by mouth every 6 (six) hours.   hydrOXYzine (ATARAX/VISTARIL) 25 MG tablet Take 1 tablet (25 mg total) by mouth 3 (three) times daily as needed for anxiety, itching or nausea.   hyoscyamine (ANASPAZ) 0.125 MG TBDP disintergrating tablet Place 0.125 mg under the tongue every 4 (four) hours as needed for cramping (Disgestion).   ipratropium (ATROVENT) 0.03 % nasal spray Place 2 sprays into both nostrils every 12 (twelve) hours.   Ipratropium-Albuterol (COMBIVENT IN) Inhale 1 application. into the lungs daily as needed (Wheezing).   levothyroxine (SYNTHROID, LEVOTHROID) 25 MCG tablet Take 25 mcg by mouth every morning.    LORazepam (ATIVAN) 0.5 MG tablet Take 1 tablet (0.5 mg total) by mouth daily as needed for anxiety.   mometasone-formoterol (DULERA) 100-5 MCG/ACT AERO Inhale 2 puffs into the lungs 2 (two) times daily as needed for wheezing or shortness  of breath.   nystatin (MYCOSTATIN) 100000 UNIT/ML suspension Use as directed in the mouth or throat daily as needed (Thrush).   promethazine-dextromethorphan (PROMETHAZINE-DM) 6.25-15 MG/5ML syrup TAKE 5 MILLILITERS BY MOUTH EVERY 6 HOURS AS NEEDED   propranolol (INDERAL) 10 MG tablet Take 1 tablet (10 mg total) by mouth 2 (two) times daily as needed. (Patient taking differently: Take 10 mg by mouth 2 (two) times daily as needed. 30 in before going  out)   propranolol ER (INDERAL LA) 120 MG 24 hr capsule Take 1 capsule (120 mg total) by mouth daily.   Rimegepant Sulfate (NURTEC) 75 MG TBDP Take 1 tab at onset of migraine.  May repeat in 2 hrs, if needed.  Max dose: 2 tabs/day. This is a 30 day prescription.   simethicone (MYLICON) 80 MG chewable tablet Chew 1 tablet (80 mg total) by mouth 4 (four) times daily as needed for flatulence (belching).   tiZANidine (ZANAFLEX) 4 MG tablet Take 4 mg by mouth at bedtime as needed for muscle spasms.   traZODone (DESYREL) 150 MG tablet Take 1 tablet (150 mg total) by mouth at bedtime.   valACYclovir (VALTREX) 1000 MG tablet Take 2,000 mg by mouth 2 (two) times daily as needed (Fever blisters). Reported on 01/10/2016   vortioxetine HBr (TRINTELLIX) 20 MG TABS tablet Take 1 tablet (20 mg total) by mouth daily.   Facility-Administered Encounter Medications as of 04/01/2023  Medication   botulinum toxin Type A (BOTOX) injection 155 Units    No results found for this or any previous visit (from the past 2160 hour(s)).   Psychiatric Specialty Exam: Physical Exam  Review of Systems  Constitutional:  Positive for fatigue.  Respiratory:         On nasal oxygen 3L    Weight 175 lb (79.4 kg), last menstrual period 09/18/1979.There is no height or weight on file to calculate BMI.  General Appearance: NA  Eye Contact:  NA  Speech:  Slow  Volume:  Decreased  Mood:  Euthymic  Affect:  NA  Thought Process:  Goal Directed  Orientation:  Full (Time, Place, and Person)  Thought Content:  WDL  Suicidal Thoughts:  No  Homicidal Thoughts:  No  Memory:  Immediate;   Good Recent;   Good Remote;   Fair  Judgement:  Intact  Insight:  Present  Psychomotor Activity:  NA  Concentration:  Concentration: Fair and Attention Span: Fair  Recall:  Good  Fund of Knowledge:  Good  Language:  Good  Akathisia:  No  Handed:  Right  AIMS (if indicated):     Assets:  Communication Skills Desire for  Improvement Housing  ADL's:  Intact  Cognition:  WNL  Sleep:  ok     Assessment/Plan: MDD (major depressive episode), single episode, severe, no psychosis (HCC) - Plan: traZODone (DESYREL) 150 MG tablet, vortioxetine HBr (TRINTELLIX) 20 MG TABS tablet  Anxiety - Plan: vortioxetine HBr (TRINTELLIX) 20 MG TABS tablet, LORazepam (ATIVAN) 0.5 MG tablet  Patient is stable on her current medication.  She has no major side effects.  Encourage exercise, keeping herself busy and if weather permits go outside as she enjoys the nature.  She does not want to change the medication since it has been working well.  Continue Trintellix 20 mg daily, Ativan 0.5 mg to take as needed for severe anxiety and trazodone 150 mg at bedtime.  Recommended to call us back if she has any question or any concern.  Follow-up in 3 months.  Follow Up Instructions:     I discussed the assessment and treatment plan with the patient. The patient was provided an opportunity to ask questions and all were answered. The patient agreed with the plan and demonstrated an understanding of the instructions.   The patient was advised to call back or seek an in-person evaluation if the symptoms worsen or if the condition fails to improve as anticipated.    Collaboration of Care: Other provider involved in patient's care AEB notes are available in epic to review.  Patient/Guardian was advised Release of Information must be obtained prior to any record release in order to collaborate their care with an outside provider. Patient/Guardian was advised if they have not already done so to contact the registration department to sign all necessary forms in order for [place] to release information regarding their care.   Consent: Patient/Guardian gives verbal consent for treatment and assignment of benefits for services provided during this visit. Patient/Guardian expressed understanding and agreed to proceed.     Note: This document was prepared  by [employer] voice dictation technology and any errors that results from this process are unintentional.    Cleotis Nipper, MD 04/01/2023

## 2023-06-05 DIAGNOSIS — Z5181 Encounter for therapeutic drug level monitoring: Secondary | ICD-10-CM | POA: Diagnosis not present

## 2023-06-05 DIAGNOSIS — M5030 Other cervical disc degeneration, unspecified cervical region: Secondary | ICD-10-CM | POA: Diagnosis not present

## 2023-06-05 DIAGNOSIS — M5416 Radiculopathy, lumbar region: Secondary | ICD-10-CM | POA: Diagnosis not present

## 2023-06-05 DIAGNOSIS — M5412 Radiculopathy, cervical region: Secondary | ICD-10-CM | POA: Diagnosis not present

## 2023-06-05 DIAGNOSIS — Z79899 Other long term (current) drug therapy: Secondary | ICD-10-CM | POA: Diagnosis not present

## 2023-06-05 DIAGNOSIS — G250 Essential tremor: Secondary | ICD-10-CM | POA: Diagnosis not present

## 2023-06-05 DIAGNOSIS — M5136 Other intervertebral disc degeneration, lumbar region: Secondary | ICD-10-CM | POA: Diagnosis not present

## 2023-06-19 ENCOUNTER — Telehealth: Payer: Self-pay

## 2023-06-19 DIAGNOSIS — N3946 Mixed incontinence: Secondary | ICD-10-CM | POA: Insufficient documentation

## 2023-06-19 DIAGNOSIS — N952 Postmenopausal atrophic vaginitis: Secondary | ICD-10-CM | POA: Insufficient documentation

## 2023-06-19 DIAGNOSIS — R159 Full incontinence of feces: Secondary | ICD-10-CM | POA: Insufficient documentation

## 2023-06-19 NOTE — Telephone Encounter (Signed)
-----   Message from Donnita Falls sent at 06/19/2023 10:19 AM EDT ----- This is a new patient coming Monday 10/7. Saw me previously when I worked at [employer] Her primary urologic issues are urge incontinence & stress incontinence.   Questions: 1. Per my last visit note with her from 07/09/2022 she wanted surgery for her stress urinary incontinence. Please let her know that she will need to see a urology surgeon for that consultation (not me). I also cannot do pessary fitting here, which had previously been discussed.   Please let patient know options for pessary fitting or surgical consultation for stress incontinence are: A) schedule to see Dr. Sherron Monday on a Monday in our [place] office B) schedule to see appropriate surgeon at [facility] C) seek surgical consultation at [facility] or elsewhere   2. I'm happy to see her for her OAB with urge incontinence but need to know in advance if she is hoping to have her [employer] device interrogated and/or re-programmed because we do not have a clinician programmer here and would need to coordinate with the [employer] rep for them to be present at the time of her appointment so they can bring one. Please ask her if that is needed.

## 2023-06-19 NOTE — Telephone Encounter (Signed)
Tried calling patient with no answer, left voiced message for return call.

## 2023-06-19 NOTE — Progress Notes (Deleted)
Name: Hayley [name] DOB: 1946/08/20 MRN: [id number]  History of Present Illness: Hayley [name] is a 77 y.o. female who presents today as a new patient at [facility]. All available relevant medical records have been reviewed.  - GU / Urogyn History:  1. OAB with urinary frequency, urgency, and urge incontinence. - 10/12/2021: Axonics device implanted by Dr. Logan Bores. 2. Stress urinary incontinence (mild). 3. Fecal incontinence.  - Improved with dietary changes and sacral neuromodulation. 4. Vaginal atrophy.  She was previously seen by me at [facility]. At our last visit on 07/09/2022: - Minimal caffeine intake (1 cup of coffee daily). - Reported feeling of a bulge / fullness the vaginal area.  - Pelvic exam: No prolapse. Atrophic. Negative CST. Right groin mole. Hemorrhoid x1. - PVR = 1 ml.  - For SUI: She declined pessary or pelvic floor physical therapy. Interested in surgery. - The plan was: 1. Axonics programming adjusted (on Program 2 with active electrode 3 at 1.30 mA at the Level 3 setting based on response). 2. Restart Myrbetriq 50 mg daily. 3. Restart topical vaginal estrogen cream use. 4. Nurse visit in 8 weeks for PVR check. 5. Return for 1st available f/u w/ Dr. Logan Bores for SUI surgery consult.   Today: She reports chief complaint of ***  She reports {Blank multiple:19197::"improved","persistent / unchanged"} urinary ***frequency, ***nocturia, ***urgency, and ***urge incontinence. Voiding ***x/day and ***x/night on average. Leaking ***x/day on average; using *** ***pads / ***diapers per day on average.  She {Actions; denies-reports:120008} dysuria, gross hematuria, straining to void, or sensations of incomplete emptying.  She {HAS HAS WCH:85277} been using vaginal estrogen cream at a frequency of *** time(s) per week.  She {Actions; denies-reports:120008} vaginal pain, bleeding, discharge.   Fall Screening: Do you usually have a  device to assist in your mobility? {yes/no:20286} ***cane / ***walker / ***wheelchair  Medications: Current Outpatient Medications  Medication Sig Dispense Refill   albuterol (PROVENTIL) (2.5 MG/3ML) 0.083% nebulizer solution Take 2.5 mg by nebulization every 4 (four) hours as needed for wheezing or shortness of breath.     apixaban (ELIQUIS) 2.5 MG TABS tablet Take 1 tablet (2.5 mg total) by mouth 2 (two) times daily. 180 tablet 4   clindamycin (CLEOCIN) 300 MG capsule Take 600 mg by mouth once.     diclofenac (FLECTOR) 1.3 % PTCH Place 1 patch onto the skin daily. (Patient taking differently: Place 1 patch onto the skin daily as needed (Joint and muscle pain).) 20 patch 1   HYDROcodone-acetaminophen (NORCO) 10-325 MG tablet Take 1 tablet by mouth every 6 (six) hours.     hydrOXYzine (ATARAX/VISTARIL) 25 MG tablet Take 1 tablet (25 mg total) by mouth 3 (three) times daily as needed for anxiety, itching or nausea. 30 tablet 0   hyoscyamine (ANASPAZ) 0.125 MG TBDP disintergrating tablet Place 0.125 mg under the tongue every 4 (four) hours as needed for cramping (Disgestion).     ipratropium (ATROVENT) 0.03 % nasal spray Place 2 sprays into both nostrils every 12 (twelve) hours. 30 mL 12   Ipratropium-Albuterol (COMBIVENT IN) Inhale 1 application. into the lungs daily as needed (Wheezing).     levothyroxine (SYNTHROID, LEVOTHROID) 25 MCG tablet Take 25 mcg by mouth every morning.      LORazepam (ATIVAN) 0.5 MG tablet Take 1 tablet (0.5 mg total) by mouth daily as needed for anxiety. 15 tablet 0   mometasone-formoterol (DULERA) 100-5 MCG/ACT AERO Inhale 2 puffs into the lungs 2 (two)  times daily as needed for wheezing or shortness of breath. 1 each 6   nystatin (MYCOSTATIN) 100000 UNIT/ML suspension Use as directed in the mouth or throat daily as needed (Thrush).     promethazine-dextromethorphan (PROMETHAZINE-DM) 6.25-15 MG/5ML syrup TAKE 5 MILLILITERS BY MOUTH EVERY 6 HOURS AS NEEDED 118 mL 1    propranolol (INDERAL) 10 MG tablet Take 1 tablet (10 mg total) by mouth 2 (two) times daily as needed. (Patient taking differently: Take 10 mg by mouth 2 (two) times daily as needed. 30 in before going out) 60 tablet 11   propranolol ER (INDERAL LA) 120 MG 24 hr capsule Take 1 capsule (120 mg total) by mouth daily. 90 capsule 3   Rimegepant Sulfate (NURTEC) 75 MG TBDP Take 1 tab at onset of migraine.  May repeat in 2 hrs, if needed.  Max dose: 2 tabs/day. This is a 30 day prescription. 8 tablet 5   simethicone (MYLICON) 80 MG chewable tablet Chew 1 tablet (80 mg total) by mouth 4 (four) times daily as needed for flatulence (belching). 30 tablet 0   tiZANidine (ZANAFLEX) 4 MG tablet Take 4 mg by mouth at bedtime as needed for muscle spasms.     traZODone (DESYREL) 150 MG tablet Take 1 tablet (150 mg total) by mouth at bedtime. 90 tablet 0   valACYclovir (VALTREX) 1000 MG tablet Take 2,000 mg by mouth 2 (two) times daily as needed (Fever blisters). Reported on 01/10/2016  2   vortioxetine HBr (TRINTELLIX) 20 MG TABS tablet Take 1 tablet (20 mg total) by mouth daily. 90 tablet 0   Current Facility-Administered Medications  Medication Dose Route Frequency Provider Last Rate Last Admin   botulinum toxin Type A (BOTOX) injection 155 Units  155 Units Intramuscular Q90 days Glean Salvo, NP   155 Units at 12/25/22 1610    Allergies: Allergies  Allergen Reactions   Bee Venom Anaphylaxis   Morphine Other (See Comments)    Hallucinations   Morphine And Codeine    Tape Rash    Rash - only use paper tape   Etodolac Diarrhea and Nausea And Vomiting   Other Itching and Swelling    Bee venom   Penicillins Rash    Has patient had a PCN reaction causing immediate rash, facial/tongue/throat swelling, SOB or lightheadedness with hypotension: Yes Has patient had a PCN reaction causing severe rash involving mucus membranes or skin necrosis: No Has patient had a PCN reaction that required hospitalization  No Has patient had a PCN reaction occurring within the last 10 years: No If all of the above answers are "NO", then may proceed with Cephalosporin use.    Poison Ivy Extract  [Poison Ivy Extract] Itching and Swelling   Propoxyphene Nausea And Vomiting   Propoxyphene N-Acetaminophen Nausea And Vomiting    Past Medical History:  Diagnosis Date   Anemia    Anxiety    Bronchitis    hx of    Clotting disorder (HCC) 02-17-14   S/p appendectomy-developed Pulmonary emboli-tx. warfarin-d/c 1 yr ago.   Colon polyps    Depression    Diabetes mellitus without complication (HCC)    "borderline"- oral med   Disorder of vocal cords    spasmotic dysphonia ,02-17-14 has" whispery voice-low tone"   Hyperthyroidism    Kidney [name]    Migraines    Nausea Nov 15, 2014   cycle of migraine headaches and nausea   Neuromuscular disorder (HCC)    hands and throat"spasmodic dysphonia", tremors -  thimb and forefinger    Obesity, Class III, BMI 40-49.9 (morbid obesity) (HCC) 04/27/2010   02-17-14 reports some weight loss- intentional   Osteoarthritis    in back(chronic pain)   Pelvic pain    02-17-14 "states thinks its back related on left groin"   Pulmonary emboli (HCC)    s/p Appendectomy '13 [facility]   Reflux    Right knee pain    Sleep apnea    cpap - settings at 4    Toxic goiter    Past Surgical History:  Procedure Laterality Date   ABDOMINAL HYSTERECTOMY     APPENDECTOMY     3'13 [facility] s/p developed  Pulmonary Emboli   AUGMENTATION MAMMAPLASTY  09/17/1968   BOTOX INJECTION     for migranes   CATARACT EXTRACTION, BILATERAL Bilateral 03/2013    CHOLECYSTECTOMY     COLONOSCOPY WITH PROPOFOL N/A 12/15/2021   Procedure: COLONOSCOPY WITH PROPOFOL;  Surgeon: Jeani Hawking, MD;  Location: [facility];  Service: Gastroenterology;  Laterality: N/A;   HAND SURGERY  2004   both hands   HEMOSTASIS CLIP PLACEMENT  12/15/2021   Procedure: HEMOSTASIS CLIP PLACEMENT;  Surgeon: Jeani Hawking, MD;  Location: [facility];  Service: Gastroenterology;;   KIDNEY STONE SURGERY     KNEE ARTHROSCOPY  1998   right   LAPAROSCOPIC APPENDECTOMY  11/27/2011   Procedure: APPENDECTOMY LAPAROSCOPIC;  Surgeon: Ardeth Sportsman, MD;  Location: [facility];  Service: General;  Laterality: N/A;   POLYPECTOMY  12/15/2021   Procedure: POLYPECTOMY;  Surgeon: Jeani Hawking, MD;  Location: [facility];  Service: Gastroenterology;;   SPINAL CORD STIMULATOR IMPLANT  06/04/2016   Dr. Frederica Kuster at [facility]   TOTAL KNEE ARTHROPLASTY Right 12/06/2014   Procedure: RIGHT TOTAL KNEE ARTHROPLASTY;  Surgeon: Ollen Gross, MD;  Location: [facility];  Service: Orthopedics;  Laterality: Right;   TOTAL KNEE REVISION Left 02/24/2014   Procedure: LEFT TOTAL KNEE REVISION;  Surgeon: Loanne Drilling, MD;  Location: [facility];  Service: Orthopedics;  Laterality: Left;   vocal cord surgery  02-17-14   01-12-14 done in [place] [facility](s/p selective denervation/reinnervation recurrent laryngeal nerve surgery)   zenkers diverticulum     Family History  Problem Relation Age of Onset   Heart disease Mother    Pneumonia Father    Cirrhosis Father    Hypertension Father    Alcoholism Father    Heart attack Paternal Grandfather    Heart attack Maternal Grandfather    Heart attack Paternal Grandmother    Heart attack Maternal Grandmother    Breast cancer Daughter    Social History   Socioeconomic History   Marital status: Widowed    Spouse name: Not on file   Number of children: 5   Years of education: 56   Highest education level: Not on file  Occupational History   Occupation: Retired  Tobacco Use   Smoking status: Former    Current packs/day: 0.00    Average packs/day: 0.3 packs/day for 5.0 years (1.3 ttl pk-yrs)    Types: Cigarettes    Start date: 01/03/1977    Quit date: 01/03/1982    Years since quitting: 41.4   Smokeless tobacco: Never  Vaping Use   Vaping status: Never Used  Substance and Sexual Activity   Alcohol use:  No   Drug use: No   Sexual activity: Not Currently  Other Topics Concern   Not on file  Social History Narrative   Lives at home alone  at this time.   Right-handed.   2 cups caffeine per day.   Social Determinants of Health   Financial Resource Strain: Medium Risk (01/21/2018)   Overall Financial Resource Strain (CARDIA)    Difficulty of Paying Living Expenses: Somewhat hard  Food Insecurity: Food Insecurity Present (09/07/2022)   Hunger Vital Sign    Worried About Running Out of Food in the Last Year: Sometimes true    Ran Out of Food in the Last Year: Sometimes true  Transportation Needs: Unmet Transportation Needs (09/06/2022)   PRAPARE - Transportation    Lack of Transportation (Medical): No    Lack of Transportation (Non-Medical): Yes  Physical Activity: Inactive (01/21/2018)   Exercise Vital Sign    Days of Exercise per Week: 0 days    Minutes of Exercise per Session: 0 min  Stress: Stress Concern Present (01/21/2018)   [employer] of Occupational Health - Occupational Stress Questionnaire    Feeling of Stress : Very much  Social Connections: Unknown (01/28/2022)   Received from [facility]   Social Network    Social Network: Not on file  Intimate Partner Violence: Unknown (12/20/2021)   Received from [facility]   HITS    Physically Hurt: Not on file    Insult or Talk Down To: Not on file    Threaten Physical Harm: Not on file    Scream or Curse: Not on file    SUBJECTIVE  Review of Systems*** Constitutional: Patient denies any unintentional weight loss or change in strength lntegumentary: Patient denies any rashes or pruritus Eyes: Patient denies ***dry eyes ENT: Patient ***denies dry mouth Cardiovascular: Patient denies chest pain or syncope Respiratory: Patient denies shortness of breath Gastrointestinal: Patient ***denies nausea, vomiting, constipation, or diarrhea Musculoskeletal: Patient denies muscle cramps or  weakness Neurologic: Patient denies convulsions or seizures Allergic/Immunologic: Patient denies recent allergic reaction(s) Hematologic/Lymphatic: Patient denies bleeding tendencies Endocrine: Patient denies heat/cold intolerance  GU: As per HPI.  OBJECTIVE There were no vitals filed for this visit. There is no height or weight on file to calculate BMI.  Physical Examination*** Constitutional: No obvious distress; patient is non-toxic appearing  Cardiovascular: No visible lower extremity edema.  Respiratory: The patient does not have audible wheezing/stridor; respirations do not appear labored  Gastrointestinal: Abdomen non-distended Musculoskeletal: Normal ROM of UEs  Skin: No obvious rashes/open sores  Neurologic: CN 2-12 grossly intact Psychiatric: Answered questions appropriately with normal affect  Hematologic/Lymphatic/Immunologic: No obvious bruises or sites of spontaneous bleeding  UA: ***negative *** WBC/hpf, *** RBC/hpf, bacteria (***) PVR: *** ml  ASSESSMENT No diagnosis found. ***  Will plan for follow up in *** months or sooner if needed. Pt verbalized understanding and agreement. All questions were answered.  PLAN Advised the following: *** ***No follow-ups on file.  No orders of the defined types were placed in this encounter.   It has been explained that the patient is to follow regularly with their PCP in addition to all other providers involved in their care and to follow instructions provided by these respective offices. Patient advised to contact urology clinic if any urologic-pertaining questions, concerns, new symptoms or problems arise in the interim period.  There are no Patient Instructions on file for this visit.  Electronically signed by:  Donnita Falls, MSN, FNP-C, CUNP 06/19/2023 10:12 AM

## 2023-06-21 NOTE — Telephone Encounter (Signed)
Patient returned call.   Requesting  a call back today by 1 pm.  Due to an appointment.

## 2023-06-24 ENCOUNTER — Ambulatory Visit: Payer: Medicare Other | Admitting: Urology

## 2023-06-24 DIAGNOSIS — N952 Postmenopausal atrophic vaginitis: Secondary | ICD-10-CM

## 2023-06-24 DIAGNOSIS — N3946 Mixed incontinence: Secondary | ICD-10-CM

## 2023-06-24 DIAGNOSIS — R159 Full incontinence of feces: Secondary | ICD-10-CM

## 2023-06-30 ENCOUNTER — Other Ambulatory Visit (HOSPITAL_COMMUNITY): Payer: Self-pay | Admitting: Psychiatry

## 2023-06-30 DIAGNOSIS — F322 Major depressive disorder, single episode, severe without psychotic features: Secondary | ICD-10-CM

## 2023-07-01 ENCOUNTER — Telehealth (HOSPITAL_BASED_OUTPATIENT_CLINIC_OR_DEPARTMENT_OTHER): Payer: Medicare Other | Admitting: Psychiatry

## 2023-07-01 ENCOUNTER — Encounter (HOSPITAL_COMMUNITY): Payer: Self-pay | Admitting: Psychiatry

## 2023-07-01 VITALS — Wt 175.0 lb

## 2023-07-01 DIAGNOSIS — F419 Anxiety disorder, unspecified: Secondary | ICD-10-CM | POA: Diagnosis not present

## 2023-07-01 DIAGNOSIS — F322 Major depressive disorder, single episode, severe without psychotic features: Secondary | ICD-10-CM | POA: Diagnosis not present

## 2023-07-01 MED ORDER — TRAZODONE HCL 150 MG PO TABS: 150.0000 mg | ORAL_TABLET | Freq: Every day | ORAL | 0 refills | Status: DC

## 2023-07-01 MED ORDER — LORAZEPAM 0.5 MG PO TABS: 0.5000 mg | ORAL_TABLET | Freq: Every day | ORAL | 0 refills | Status: DC | PRN

## 2023-07-01 MED ORDER — VORTIOXETINE HBR 20 MG PO TABS: 20.0000 mg | ORAL_TABLET | Freq: Every day | ORAL | 0 refills | Status: DC

## 2023-07-01 NOTE — Progress Notes (Signed)
Health MD Virtual Progress Note   Patient Location: Home Provider Location: Home Office  I connect with patient by telephone and verified that I am speaking with correct person by using two identifiers. I discussed the limitations of evaluation and management by telemedicine and the availability of in person appointments. I also discussed with the patient that there may be a patient responsible charge related to this service. The patient expressed understanding and agreed to proceed.  Hayley [name] [id number] 77 y.o.  07/01/2023 2:17 PM  History of Present Illness:  Patient is evaluated by phone session.  She is in the process of getting dental implant and hoping the process completed as soon as possible.  She got the implant but it hurts about taking some time to get adjusted.  Overall she feels things are going very well.  She sleeps good.  She is planning to attend family reunion after 40 years and she is very pleased that her daughter and her husband is taking her to [place] where reunion going to happen.  She is still on nasal oxygen.  She like to get started on combination.  She sleeps good.  Occasionally she takes Ativan when she is very nervous and anxious.  Lately she has taken few when she was seeing the dentist.  Her appetite is okay.  She denies any crying spells or any feeling of hopelessness or worthlessness.  She lives with her dog and cat.  She tried to walk every day and that helps.  She wants to keep the current medication.  She is hoping that she can get the medication from [facility] next year but like to have her prescriptions sent to [facility] this time.  She has no tremor or shakes or any EPS.  Past Psychiatric History: H/O depression and anxiety.  H/O suicidal attempt on Percocet in March 2019.  Took Effexor in her 38s but stopped after working for a while.  Tried Zoloft, Paxil and Wellbutrin from PCP but did not work. Did IOP, PHP and given Prozac but no  improvement. Tried lexapro caused GI s/e, Lamictal caused increase tremors.      Outpatient Encounter Medications as of 07/01/2023  Medication Sig   albuterol (PROVENTIL) (2.5 MG/3ML) 0.083% nebulizer solution Take 2.5 mg by nebulization every 4 (four) hours as needed for wheezing or shortness of breath.   apixaban (ELIQUIS) 2.5 MG TABS tablet Take 1 tablet (2.5 mg total) by mouth 2 (two) times daily.   clindamycin (CLEOCIN) 300 MG capsule Take 600 mg by mouth once.   diclofenac (FLECTOR) 1.3 % PTCH Place 1 patch onto the skin daily. (Patient taking differently: Place 1 patch onto the skin daily as needed (Joint and muscle pain).)   HYDROcodone-acetaminophen (NORCO) 10-325 MG tablet Take 1 tablet by mouth every 6 (six) hours.   hydrOXYzine (ATARAX/VISTARIL) 25 MG tablet Take 1 tablet (25 mg total) by mouth 3 (three) times daily as needed for anxiety, itching or nausea.   hyoscyamine (ANASPAZ) 0.125 MG TBDP disintergrating tablet Place 0.125 mg under the tongue every 4 (four) hours as needed for cramping (Disgestion).   ipratropium (ATROVENT) 0.03 % nasal spray Place 2 sprays into both nostrils every 12 (twelve) hours.   Ipratropium-Albuterol (COMBIVENT IN) Inhale 1 application. into the lungs daily as needed (Wheezing).   levothyroxine (SYNTHROID, LEVOTHROID) 25 MCG tablet Take 25 mcg by mouth every morning.    LORazepam (ATIVAN) 0.5 MG tablet Take 1 tablet (0.5 mg total) by mouth daily as  needed for anxiety.   mometasone-formoterol (DULERA) 100-5 MCG/ACT AERO Inhale 2 puffs into the lungs 2 (two) times daily as needed for wheezing or shortness of breath.   nystatin (MYCOSTATIN) 100000 UNIT/ML suspension Use as directed in the mouth or throat daily as needed (Thrush).   promethazine-dextromethorphan (PROMETHAZINE-DM) 6.25-15 MG/5ML syrup TAKE 5 MILLILITERS BY MOUTH EVERY 6 HOURS AS NEEDED   propranolol (INDERAL) 10 MG tablet Take 1 tablet (10 mg total) by mouth 2 (two) times daily as needed.  (Patient taking differently: Take 10 mg by mouth 2 (two) times daily as needed. 30 in before going out)   propranolol ER (INDERAL LA) 120 MG 24 hr capsule Take 1 capsule (120 mg total) by mouth daily.   Rimegepant Sulfate (NURTEC) 75 MG TBDP Take 1 tab at onset of migraine.  May repeat in 2 hrs, if needed.  Max dose: 2 tabs/day. This is a 30 day prescription.   simethicone (MYLICON) 80 MG chewable tablet Chew 1 tablet (80 mg total) by mouth 4 (four) times daily as needed for flatulence (belching).   tiZANidine (ZANAFLEX) 4 MG tablet Take 4 mg by mouth at bedtime as needed for muscle spasms.   traZODone (DESYREL) 150 MG tablet Take 1 tablet (150 mg total) by mouth at bedtime.   valACYclovir (VALTREX) 1000 MG tablet Take 2,000 mg by mouth 2 (two) times daily as needed (Fever blisters). Reported on 01/10/2016   vortioxetine HBr (TRINTELLIX) 20 MG TABS tablet Take 1 tablet (20 mg total) by mouth daily.   Facility-Administered Encounter Medications as of 07/01/2023  Medication   botulinum toxin Type A (BOTOX) injection 155 Units    No results found for this or any previous visit (from the past 2160 hour(s)).   Psychiatric Specialty Exam: Physical Exam  Review of Systems  Weight 175 lb (79.4 kg), last menstrual period 09/18/1979.There is no height or weight on file to calculate BMI.  General Appearance: NA  Eye Contact:  NA  Speech:  Slow  Volume:  Decreased  Mood:  Anxious  Affect:  NA  Thought Process:  Descriptions of Associations: Intact  Orientation:  Full (Time, Place, and Person)  Thought Content:  Logical  Suicidal Thoughts:  No  Homicidal Thoughts:  No  Memory:  Immediate;   Good Recent;   Good Remote;   Good  Judgement:  Intact  Insight:  Present  Psychomotor Activity:  NA  Concentration:  Concentration: Fair and Attention Span: Fair  Recall:  Good  Fund of Knowledge:  Good  Language:  Fair  Akathisia:  No  Handed:  Right  AIMS (if indicated):     Assets:   Communication Skills Desire for Improvement Housing Resilience  ADL's:  Intact  Cognition:  WNL  Sleep:  ok     Assessment/Plan: MDD (major depressive episode), single episode, severe, no psychosis (HCC) - Plan: traZODone (DESYREL) 150 MG tablet, vortioxetine HBr (TRINTELLIX) 20 MG TABS tablet  Anxiety - Plan: LORazepam (ATIVAN) 0.5 MG tablet, vortioxetine HBr (TRINTELLIX) 20 MG TABS tablet  Patient is stable on current medication.  She excited about going to school reunion after many years in [place].  Her son-in-law and daughter will go with her.  She has no major concern or issue with the medication.  Continue Trintellix 20 mg daily, trazodone 150 mg at bedtime and Ativan 0.5 mg as needed for anxiety.  Recommend to call us back with any question or any concern.  Follow-up in 3 months.   Follow Up Instructions:  I discussed the assessment and treatment plan with the patient. The patient was provided an opportunity to ask questions and all were answered. The patient agreed with the plan and demonstrated an understanding of the instructions.   The patient was advised to call back or seek an in-person evaluation if the symptoms worsen or if the condition fails to improve as anticipated.    Collaboration of Care: Other provider involved in patient's care AEB notes are available in epic to review  Patient/Guardian was advised Release of Information must be obtained prior to any record release in order to collaborate their care with an outside provider. Patient/Guardian was advised if they have not already done so to contact the registration department to sign all necessary forms in order for [place] to release information regarding their care.   Consent: Patient/Guardian gives verbal consent for treatment and assignment of benefits for services provided during this visit. Patient/Guardian expressed understanding and agreed to proceed.     I provided 22 minutes of non face to face time  during this encounter.  Note: This document was prepared by [employer] voice dictation technology and any errors that results from this process are unintentional.    Cleotis Nipper, MD 07/01/2023

## 2023-07-03 ENCOUNTER — Other Ambulatory Visit: Payer: Self-pay | Admitting: Neurology

## 2023-07-05 DIAGNOSIS — G250 Essential tremor: Secondary | ICD-10-CM | POA: Diagnosis not present

## 2023-07-19 ENCOUNTER — Other Ambulatory Visit: Payer: Self-pay | Admitting: Neurology

## 2023-07-28 ENCOUNTER — Other Ambulatory Visit: Payer: Self-pay | Admitting: Pulmonary Disease

## 2023-08-08 ENCOUNTER — Other Ambulatory Visit: Payer: Self-pay | Admitting: Pulmonary Disease

## 2023-08-08 DIAGNOSIS — R053 Chronic cough: Secondary | ICD-10-CM

## 2023-08-22 ENCOUNTER — Other Ambulatory Visit (HOSPITAL_COMMUNITY): Payer: Self-pay

## 2023-08-22 ENCOUNTER — Telehealth: Payer: Self-pay

## 2023-08-22 DIAGNOSIS — Z96653 Presence of artificial knee joint, bilateral: Secondary | ICD-10-CM | POA: Diagnosis not present

## 2023-08-22 DIAGNOSIS — Z471 Aftercare following joint replacement surgery: Secondary | ICD-10-CM | POA: Diagnosis not present

## 2023-08-22 NOTE — Telephone Encounter (Signed)
Pharmacy Patient Advocate Encounter   Received notification from CoverMyMeds that prior authorization for Nurtec 75MG  dispersible tablets is required/requested.   Insurance verification completed.   The patient is insured through [employer] .   Per test claim: PA required; PA submitted to above mentioned insurance via CoverMyMeds Key/confirmation #/EOC [id number] Status is pending

## 2023-08-30 ENCOUNTER — Other Ambulatory Visit (HOSPITAL_COMMUNITY): Payer: Self-pay

## 2023-08-30 NOTE — Telephone Encounter (Signed)
Pharmacy Patient Advocate Encounter  Received notification from [facility] that Prior Authorization for Nurtec 75mg  ODT has been APPROVED from 07/23/2023 to 08/21/2024. Ran test claim, Copay is $0. This test claim was processed through [facility]- copay amounts may vary at other pharmacies due to pharmacy/plan contracts, or as the patient moves through the different stages of their insurance plan.   PA #/Case ID/Reference #: PA Case ID #: [id number]

## 2023-09-13 ENCOUNTER — Other Ambulatory Visit (HOSPITAL_COMMUNITY): Payer: Self-pay | Admitting: Psychiatry

## 2023-09-13 DIAGNOSIS — F322 Major depressive disorder, single episode, severe without psychotic features: Secondary | ICD-10-CM

## 2023-10-01 ENCOUNTER — Telehealth (HOSPITAL_COMMUNITY): Payer: Medicare (Managed Care) | Admitting: Psychiatry

## 2023-10-01 ENCOUNTER — Encounter (HOSPITAL_COMMUNITY): Payer: Self-pay | Admitting: Psychiatry

## 2023-10-01 VITALS — Wt 170.0 lb

## 2023-10-01 DIAGNOSIS — F322 Major depressive disorder, single episode, severe without psychotic features: Secondary | ICD-10-CM | POA: Diagnosis not present

## 2023-10-01 DIAGNOSIS — F419 Anxiety disorder, unspecified: Secondary | ICD-10-CM | POA: Diagnosis not present

## 2023-10-01 DIAGNOSIS — R251 Tremor, unspecified: Secondary | ICD-10-CM | POA: Diagnosis not present

## 2023-10-01 MED ORDER — HYDROXYZINE HCL 25 MG PO TABS: 25.0000 mg | ORAL_TABLET | Freq: Three times a day (TID) | ORAL | 0 refills | Status: DC | PRN

## 2023-10-01 MED ORDER — TRAZODONE HCL 150 MG PO TABS: 150.0000 mg | ORAL_TABLET | Freq: Every day | ORAL | 0 refills | Status: DC

## 2023-10-01 MED ORDER — VORTIOXETINE HBR 20 MG PO TABS: 20.0000 mg | ORAL_TABLET | Freq: Every day | ORAL | 0 refills | Status: DC

## 2023-10-01 MED ORDER — LORAZEPAM 0.5 MG PO TABS: 0.5000 mg | ORAL_TABLET | Freq: Every day | ORAL | 0 refills | Status: DC | PRN

## 2023-10-01 NOTE — Progress Notes (Signed)
  Health MD Virtual Progress Note   Patient Location: Home Provider Location: Home Office  I connect with patient by video and verified that I am speaking with correct person by using two identifiers. I discussed the limitations of evaluation and management by telemedicine and the availability of in person appointments. I also discussed with the patient that there may be a patient responsible charge related to this service. The patient expressed understanding and agreed to proceed.  Hayley [name] [id number] 78 y.o.  10/01/2023 9:05 AM  History of Present Illness:  Patient is evaluated by video session.  She had a good family reunion and she was very happy to see a lot of people at reunion.  She reported things are going okay but sometimes she feel lack of motivation and desire to do things.  She ruminates about her age and sad about not able to do things that she used to do few years ago.  She started to have brings and nightmares of her deceased husband few weeks ago and sometimes she wakes up in the middle of the night and difficult to go back to sleep.  She is not sure why these readings are coming back now.  She reported Christmas was very quiet but she was prepared for the Christmas.  Lately she started to have some GI symptoms and seeing doctors.  Her weight and appetite is fluctuating.  She is in the process of new physician for primary care and had appointment coming up this Friday.  She lives with her dog and cats.  She tried to walk every day when weather is not as bad.  She has a car and sometime she does go outside without any purpose to get out of the home.  She like her living place.  She admitted tremors and shakes and she takes propranolol  but sometime it does not help as such.  She used to take hydroxyzine  for itching that also helps the tremors and sleep.  She like to restart the medication.   She denies any hallucination, paranoia or any suicidal thoughts.  She is  taking Trintellix , trazodone  and very rarely Ativan  when she has panic attacks.  Past Psychiatric History: H/O depression and anxiety.  H/O suicidal attempt on Percocet in March 2019.  Took Effexor in her 16s but stopped after working for a while.  Tried Zoloft , Paxil and Wellbutrin  from PCP but did not work. Did IOP, PHP and given Prozac  but no improvement. Tried lexapro  caused GI s/e, Lamictal  caused increase tremors.       Outpatient Encounter Medications as of 10/01/2023  Medication Sig   albuterol  (PROVENTIL ) (2.5 MG/3ML) 0.083% nebulizer solution Take 2.5 mg by nebulization every 4 (four) hours as needed for wheezing or shortness of breath.   apixaban  (ELIQUIS ) 2.5 MG TABS tablet Take 1 tablet (2.5 mg total) by mouth 2 (two) times daily.   clindamycin (CLEOCIN) 300 MG capsule Take 600 mg by mouth once.   diclofenac  (FLECTOR ) 1.3 % PTCH Place 1 patch onto the skin daily. (Patient taking differently: Place 1 patch onto the skin daily as needed (Joint and muscle pain).)   HYDROcodone -acetaminophen  (NORCO) 10-325 MG tablet Take 1 tablet by mouth every 6 (six) hours.   hydrOXYzine  (ATARAX /VISTARIL ) 25 MG tablet Take 1 tablet (25 mg total) by mouth 3 (three) times daily as needed for anxiety, itching or nausea.   hyoscyamine (ANASPAZ) 0.125 MG TBDP disintergrating tablet Place 0.125 mg under the tongue every 4 (four) hours as  needed for cramping (Disgestion).   ipratropium (ATROVENT ) 0.03 % nasal spray Place 2 sprays into both nostrils every 12 (twelve) hours.   Ipratropium-Albuterol  (COMBIVENT  IN) Inhale 1 application. into the lungs daily as needed (Wheezing).   levothyroxine  (SYNTHROID , LEVOTHROID) 25 MCG tablet Take 25 mcg by mouth every morning.    LORazepam  (ATIVAN ) 0.5 MG tablet Take 1 tablet (0.5 mg total) by mouth daily as needed for anxiety.   mometasone -formoterol  (DULERA ) 100-5 MCG/ACT AERO Inhale 2 puffs into the lungs 2 (two) times daily as needed for wheezing or shortness of breath.    nystatin  (MYCOSTATIN ) 100000 UNIT/ML suspension Use as directed in the mouth or throat daily as needed (Thrush).   promethazine -dextromethorphan  (PROMETHAZINE -DM) 6.25-15 MG/5ML syrup TAKE 5 MILLILITERS BY MOUTH EVERY 6 HOURS AS NEEDED   propranolol  (INDERAL ) 10 MG tablet Take 1 tablet (10 mg total) by mouth 2 (two) times daily as needed. (Patient taking differently: Take 10 mg by mouth 2 (two) times daily as needed. 30 in before going out)   propranolol  ER (INDERAL  LA) 120 MG 24 hr capsule TAKE 1 CAPSULE BY MOUTH EVERY DAY   Rimegepant Sulfate (NURTEC) 75 MG TBDP TAKE 1 TAB AT ONSET OF MIGRAINE. MAY REPEAT IN 2 HRS, IF NEEDED. MAX DOSE: 2 TABS/DAY.   simethicone  (MYLICON) 80 MG chewable tablet Chew 1 tablet (80 mg total) by mouth 4 (four) times daily as needed for flatulence (belching).   tiZANidine (ZANAFLEX) 4 MG tablet Take 4 mg by mouth at bedtime as needed for muscle spasms.   traZODone  (DESYREL ) 150 MG tablet Take 1 tablet (150 mg total) by mouth at bedtime.   valACYclovir  (VALTREX ) 1000 MG tablet Take 2,000 mg by mouth 2 (two) times daily as needed (Fever blisters). Reported on 01/10/2016   vortioxetine  HBr (TRINTELLIX ) 20 MG TABS tablet Take 1 tablet (20 mg total) by mouth daily.   Facility-Administered Encounter Medications as of 10/01/2023  Medication   botulinum toxin Type A  (BOTOX ) injection 155 Units    No results found for this or any previous visit (from the past 2160 hours).   Psychiatric Specialty Exam: Physical Exam  Review of Systems  Last menstrual period 09/18/1979.There is no height or weight on file to calculate BMI.  General Appearance: Casual  Eye Contact:  Good  Speech:  Slow  Volume:  Decreased  Mood:  Euthymic  Affect:  Congruent  Thought Process:  Goal Directed  Orientation:  Full (Time, Place, and Person)  Thought Content:  Rumination  Suicidal Thoughts:  No  Homicidal Thoughts:  No  Memory:  Immediate;   Fair Recent;   Fair Remote;   Fair   Judgement:  Intact  Insight:  Present  Psychomotor Activity:  Tremor  Concentration:  Concentration: Fair and Attention Span: Fair  Recall:  Good  Fund of Knowledge:  Good  Language:  Good  Akathisia:  No  Handed:  Right  AIMS (if indicated):     Assets:  Communication Skills Desire for Improvement Housing Transportation  ADL's:  Intact  Cognition:  WNL  Sleep:  fair     Assessment/Plan: MDD (major depressive episode), single episode, severe, no psychosis (HCC) - Plan: traZODone  (DESYREL ) 150 MG tablet, vortioxetine  HBr (TRINTELLIX ) 20 MG TABS tablet, hydrOXYzine  (ATARAX ) 25 MG tablet  Anxiety - Plan: LORazepam  (ATIVAN ) 0.5 MG tablet, vortioxetine  HBr (TRINTELLIX ) 20 MG TABS tablet, hydrOXYzine  (ATARAX ) 25 MG tablet  Tremor - Plan: hydrOXYzine  (ATARAX ) 25 MG tablet  I review psychosocial stressors.  She is concerned  about having nightmares and dream about her deceased husband.  She also concerned about itching, tremors, not sleeping very well.  She has chronic tremors and she takes propranolol .  She used to take hydroxyzine  but has not taken in a while.  We can reconsider starting to take as needed that can help her anxiety and sleep and may be helpful for tremors.  Encouraged to keep appointment with her primary care.  Recommend to take the Ativan  for severe panic attack or anxiety attack.  She admitted lot of financial stress and that the stuff sleep not interested for therapy.  Patient had support from her son-in-law and daughter if she needed any help she can reach them.  I encouraged to have blood work results faxed to us  from her upcoming visit at [facility] physician review primary care.  Will follow up in 3 months. Continue Trintellix  20 mg daily, Ativan  0.5 mg as needed for severe anxiety, trazodone  150 mg at bedtime and hydroxyzine  as needed for sleep.  I discussed polypharmacy and possibility of anticholinergic side effects of taking multiple medication.  We will consider stopping  the hydroxyzine  in the future visit once her sleep cycle get stable.    Follow Up Instructions:     I discussed the assessment and treatment plan with the patient. The patient was provided an opportunity to ask questions and all were answered. The patient agreed with the plan and demonstrated an understanding of the instructions.   The patient was advised to call back or seek an in-person evaluation if the symptoms worsen or if the condition fails to improve as anticipated.    Collaboration of Care: Other provider involved in patient's care AEB notes are available in epic to review  Patient/Guardian was advised Release of Information must be obtained prior to any record release in order to collaborate their care with an outside provider. Patient/Guardian was advised if they have not already done so to contact the registration department to sign all necessary forms in order for us  to release information regarding their care.   Consent: Patient/Guardian gives verbal consent for treatment and assignment of benefits for services provided during this visit. Patient/Guardian expressed understanding and agreed to proceed.     I provided 27 minutes of non face to face time during this encounter.  Note: This document was prepared by [employer] voice dictation technology and any errors that results from this process are unintentional.    Leni ONEIDA Client, MD 10/01/2023

## 2023-10-05 ENCOUNTER — Other Ambulatory Visit: Payer: Self-pay | Admitting: Pulmonary Disease

## 2023-10-05 DIAGNOSIS — R053 Chronic cough: Secondary | ICD-10-CM

## 2023-11-29 ENCOUNTER — Other Ambulatory Visit: Payer: Self-pay | Admitting: Pulmonary Disease

## 2023-11-29 DIAGNOSIS — R053 Chronic cough: Secondary | ICD-10-CM

## 2023-11-29 NOTE — Telephone Encounter (Signed)
 [facility] in [place]  PT needs cough syrup Promethazine. Please refill. TY

## 2023-12-02 NOTE — Telephone Encounter (Signed)
 ATC x1, ldvm to call office back.

## 2023-12-02 NOTE — Telephone Encounter (Signed)
 Spoke w/ Lafonda Mosses. Pt says her allergies are flaring up the past few days, coughing seems to be borderline aggravating. Non- productive cough, unable to cough up phlegm. Pt says Promethazine cough syrup is the only thing that is able to help. Please advise Dr. Francine Graven

## 2023-12-06 ENCOUNTER — Other Ambulatory Visit: Payer: Self-pay | Admitting: Pulmonary Disease

## 2023-12-06 DIAGNOSIS — R053 Chronic cough: Secondary | ICD-10-CM

## 2023-12-07 ENCOUNTER — Other Ambulatory Visit: Payer: Self-pay | Admitting: Pulmonary Disease

## 2023-12-07 DIAGNOSIS — R053 Chronic cough: Secondary | ICD-10-CM

## 2023-12-08 MED ORDER — PROMETHAZINE-DM 6.25-15 MG/5ML PO SYRP: ORAL_SOLUTION | ORAL | 1 refills | Status: DC

## 2023-12-09 NOTE — Telephone Encounter (Signed)
Please advise refill request

## 2023-12-11 NOTE — Telephone Encounter (Signed)
 RX filled. [facility]N

## 2023-12-18 DIAGNOSIS — M5416 Radiculopathy, lumbar region: Secondary | ICD-10-CM | POA: Diagnosis not present

## 2023-12-18 DIAGNOSIS — M5412 Radiculopathy, cervical region: Secondary | ICD-10-CM | POA: Diagnosis not present

## 2023-12-24 DIAGNOSIS — E538 Deficiency of other specified B group vitamins: Secondary | ICD-10-CM | POA: Diagnosis not present

## 2023-12-24 DIAGNOSIS — E039 Hypothyroidism, unspecified: Secondary | ICD-10-CM | POA: Diagnosis not present

## 2023-12-24 DIAGNOSIS — R5383 Other fatigue: Secondary | ICD-10-CM | POA: Diagnosis not present

## 2023-12-24 DIAGNOSIS — Z0000 Encounter for general adult medical examination without abnormal findings: Secondary | ICD-10-CM | POA: Diagnosis not present

## 2023-12-24 DIAGNOSIS — E1169 Type 2 diabetes mellitus with other specified complication: Secondary | ICD-10-CM | POA: Diagnosis not present

## 2023-12-24 DIAGNOSIS — Z23 Encounter for immunization: Secondary | ICD-10-CM | POA: Diagnosis not present

## 2023-12-29 ENCOUNTER — Other Ambulatory Visit (HOSPITAL_COMMUNITY): Payer: Self-pay | Admitting: Psychiatry

## 2023-12-29 DIAGNOSIS — F322 Major depressive disorder, single episode, severe without psychotic features: Secondary | ICD-10-CM

## 2023-12-30 ENCOUNTER — Other Ambulatory Visit (HOSPITAL_COMMUNITY): Payer: Self-pay | Admitting: *Deleted

## 2023-12-30 ENCOUNTER — Telehealth (HOSPITAL_BASED_OUTPATIENT_CLINIC_OR_DEPARTMENT_OTHER): Payer: Medicare (Managed Care) | Admitting: Psychiatry

## 2023-12-30 ENCOUNTER — Telehealth (HOSPITAL_COMMUNITY): Payer: Self-pay | Admitting: *Deleted

## 2023-12-30 ENCOUNTER — Encounter (HOSPITAL_COMMUNITY): Payer: Self-pay | Admitting: Psychiatry

## 2023-12-30 VITALS — Wt 170.0 lb

## 2023-12-30 DIAGNOSIS — F419 Anxiety disorder, unspecified: Secondary | ICD-10-CM

## 2023-12-30 DIAGNOSIS — F322 Major depressive disorder, single episode, severe without psychotic features: Secondary | ICD-10-CM | POA: Diagnosis not present

## 2023-12-30 DIAGNOSIS — R251 Tremor, unspecified: Secondary | ICD-10-CM | POA: Diagnosis not present

## 2023-12-30 MED ORDER — LORAZEPAM 0.5 MG PO TABS: 0.5000 mg | ORAL_TABLET | Freq: Every day | ORAL | 0 refills | Status: DC | PRN

## 2023-12-30 MED ORDER — HYDROXYZINE HCL 25 MG PO TABS: 25.0000 mg | ORAL_TABLET | ORAL | 0 refills | Status: DC | PRN

## 2023-12-30 MED ORDER — VORTIOXETINE HBR 20 MG PO TABS: 20.0000 mg | ORAL_TABLET | Freq: Every day | ORAL | 0 refills | Status: DC

## 2023-12-30 MED ORDER — TRAZODONE HCL 150 MG PO TABS: 150.0000 mg | ORAL_TABLET | Freq: Every day | ORAL | 0 refills | Status: DC

## 2023-12-30 NOTE — Telephone Encounter (Signed)
 She is taking only as needed for anxiety and sleep.  Does not need 3 times a day.

## 2023-12-30 NOTE — Progress Notes (Signed)
 [facility] MD Virtual Progress Note   Patient Location: Home Provider Location: Home Office  I connect with patient by video and verified that I am speaking with correct person by using two identifiers. I discussed the limitations of evaluation and management by telemedicine and the availability of in person appointments. I also discussed with the patient that there may be a patient responsible charge related to this service. The patient expressed understanding and agreed to proceed.  Hayley [name] [id number] 78 y.o.  12/30/2023 10:07 AM  History of Present Illness:  Patient is evaluated by video session.  She reported on 04-02-25had a death anniversary of her husband and she was sad when she was going through inventory.  She did took her dog to the vet and grocery store to keep herself busy.  She reported medicine is working okay and her nightmares and flashbacks are not there but not as bad.  She is sleeping better.  Recently had a visit with her primary care Dr. Seth Daniel at [facility] physician.  Patient has chronic GI symptoms and her PCP is working on it to get a referral to see a GI.  Patient is concerned because her insurance does not approve Trintellix and is very expensive.  She does not want to change the medication because it does work very well for her.  She admitted not taking for past few days and having some irritability and agitation but denies any hallucination, paranoia, suicidal thoughts.  She also have chronic tremors and had difficulty writing.  She is wondering if she can get some assistance in samples.  She has taken few times Ativan to help her anxiety and hydroxyzine to help her itching, headaches and sleep.  She is able to drive.  Her PCP recommended to take probiotic to help her GI symptoms.  Her appetite is okay.  Her weight is unchanged from the past.  She denies any hallucination, paranoia or any suicidal thoughts.  She is taking trazodone.  Past Psychiatric  History: H/O depression and anxiety.  H/O suicidal attempt on Percocet in March 2019.  Took Effexor in her 5s but stopped after working for a while.  Tried Zoloft, Paxil and Wellbutrin from PCP but did not work. Did IOP, PHP and given Prozac but no improvement. Tried lexapro caused GI s/e, Lamictal caused increase tremors.     Outpatient Encounter Medications as of 12/30/2023  Medication Sig   albuterol (PROVENTIL) (2.5 MG/3ML) 0.083% nebulizer solution Take 2.5 mg by nebulization every 4 (four) hours as needed for wheezing or shortness of breath.   apixaban (ELIQUIS) 2.5 MG TABS tablet Take 1 tablet (2.5 mg total) by mouth 2 (two) times daily.   clindamycin (CLEOCIN) 300 MG capsule Take 600 mg by mouth once.   diclofenac (FLECTOR) 1.3 % PTCH Place 1 patch onto the skin daily. (Patient taking differently: Place 1 patch onto the skin daily as needed (Joint and muscle pain).)   HYDROcodone-acetaminophen (NORCO) 10-325 MG tablet Take 1 tablet by mouth every 6 (six) hours.   hydrOXYzine (ATARAX) 25 MG tablet Take 1 tablet (25 mg total) by mouth 3 (three) times daily as needed for anxiety, itching or nausea.   hyoscyamine (ANASPAZ) 0.125 MG TBDP disintergrating tablet Place 0.125 mg under the tongue every 4 (four) hours as needed for cramping (Disgestion).   ipratropium (ATROVENT) 0.03 % nasal spray Place 2 sprays into both nostrils every 12 (twelve) hours.   Ipratropium-Albuterol (COMBIVENT IN) Inhale 1 application. into the lungs  daily as needed (Wheezing).   levothyroxine (SYNTHROID, LEVOTHROID) 25 MCG tablet Take 25 mcg by mouth every morning.    LORazepam (ATIVAN) 0.5 MG tablet Take 1 tablet (0.5 mg total) by mouth daily as needed for anxiety.   mometasone-formoterol (DULERA) 100-5 MCG/ACT AERO Inhale 2 puffs into the lungs 2 (two) times daily as needed for wheezing or shortness of breath.   nystatin (MYCOSTATIN) 100000 UNIT/ML suspension Use as directed in the mouth or throat daily as needed  (Thrush).   promethazine-dextromethorphan (PROMETHAZINE-DM) 6.25-15 MG/5ML syrup TAKE 5 MILLILITERS BY MOUTH EVERY 6 HOURS AS NEEDED   propranolol (INDERAL) 10 MG tablet Take 1 tablet (10 mg total) by mouth 2 (two) times daily as needed. (Patient taking differently: Take 10 mg by mouth 2 (two) times daily as needed. 30 in before going out)   propranolol ER (INDERAL LA) 120 MG 24 hr capsule TAKE 1 CAPSULE BY MOUTH EVERY DAY   Rimegepant Sulfate (NURTEC) 75 MG TBDP TAKE 1 TAB AT ONSET OF MIGRAINE. MAY REPEAT IN 2 HRS, IF NEEDED. MAX DOSE: 2 TABS/DAY.   simethicone (MYLICON) 80 MG chewable tablet Chew 1 tablet (80 mg total) by mouth 4 (four) times daily as needed for flatulence (belching).   tiZANidine (ZANAFLEX) 4 MG tablet Take 4 mg by mouth at bedtime as needed for muscle spasms.   traZODone (DESYREL) 150 MG tablet Take 1 tablet (150 mg total) by mouth at bedtime.   valACYclovir (VALTREX) 1000 MG tablet Take 2,000 mg by mouth 2 (two) times daily as needed (Fever blisters). Reported on 01/10/2016   vortioxetine HBr (TRINTELLIX) 20 MG TABS tablet Take 1 tablet (20 mg total) by mouth daily.   Facility-Administered Encounter Medications as of 12/30/2023  Medication   botulinum toxin Type A (BOTOX) injection 155 Units    No results found for this or any previous visit (from the past 2160 hours).   Psychiatric Specialty Exam: Physical Exam  Review of Systems  Weight 170 lb (77.1 kg), last menstrual period 09/18/1979.There is no height or weight on file to calculate BMI.  General Appearance: Casual  Eye Contact:  Good  Speech:  Clear and Coherent and Slow  Volume:  Normal  Mood:  Dysphoric  Affect:  Congruent  Thought Process:  Goal Directed  Orientation:  Full (Time, Place, and Person)  Thought Content:  Logical  Suicidal Thoughts:  No  Homicidal Thoughts:  No  Memory:  Immediate;   Fair Recent;   Fair Remote;   Fair  Judgement:  Intact  Insight:  Present  Psychomotor Activity:   Decreased and Tremor  Concentration:  Concentration: Fair and Attention Span: Fair  Recall:  Fair  Fund of Knowledge:  Good  Language:  Good  Akathisia:  No  Handed:  Right  AIMS (if indicated):     Assets:  Communication Skills Desire for Improvement Housing Transportation  ADL's:  Intact  Cognition:  WNL  Sleep:  fair       09/06/2022   11:41 AM 08/07/2019    1:08 PM 04/06/2019    1:27 PM  Depression screen PHQ 2/9  Decreased Interest 0 3 1  Down, Depressed, Hopeless 0 3 2  PHQ - 2 Score 0 6 3  Altered sleeping  3 1  Tired, decreased energy  3 1  Change in appetite  3 1  Feeling bad or failure about yourself   3 0  Trouble concentrating  3 1  Moving slowly or fidgety/restless  2 0  Suicidal thoughts  1 0  PHQ-9 Score  24 7  Difficult doing work/chores   Somewhat difficult    Assessment/Plan: MDD (major depressive episode), single episode, severe, no psychosis (HCC) - Plan: traZODone (DESYREL) 150 MG tablet, vortioxetine HBr (TRINTELLIX) 20 MG TABS tablet, hydrOXYzine (ATARAX) 25 MG tablet  Anxiety - Plan: vortioxetine HBr (TRINTELLIX) 20 MG TABS tablet, LORazepam (ATIVAN) 0.5 MG tablet, hydrOXYzine (ATARAX) 25 MG tablet  Tremor - Plan: hydrOXYzine (ATARAX) 25 MG tablet  Discussed recent death anniversary of her husband.  She kept her busy that day.  Her biggest concern is not able to afford Trintellix.  She is on 20 mg daily.  She like to have samples and resources so she can continue to take Trintellix.  She does not want to change the medication since it is working well.  Recently had a visit with primary care and labs are drawn but results not available.  Will contact his PCP to get the labs.  Patient see Dr. Maryrose Soja at [facility] physician.  I recommend we can try to provide samples if available but she may need to submit patient assistance program.  Due to tremor she cannot write so I asked her to come to office and one of our staff can help her to write the patient  assistance program application in [place].  Patient agreed with the plan.  Recommend to continue 0.5 Ativan as needed for severe anxiety, trazodone 50 mg at bedtime, hydroxyzine 25 mg as needed for severe anxiety which also helps her itching and sleep.  Continue Trintellix 20 mg daily.  Recommend to call us  back if she has any question or any concern.  Follow-up in 3 months.  Discussed polypharmacy but patient like to keep these medication for now.   Follow Up Instructions:     I discussed the assessment and treatment plan with the patient. The patient was provided an opportunity to ask questions and all were answered. The patient agreed with the plan and demonstrated an understanding of the instructions.   The patient was advised to call back or seek an in-person evaluation if the symptoms worsen or if the condition fails to improve as anticipated.    Collaboration of Care: Other provider involved in patient's care AEB notes are available in epic to review  Patient/Guardian was advised Release of Information must be obtained prior to any record release in order to collaborate their care with an outside provider. Patient/Guardian was advised if they have not already done so to contact the registration department to sign all necessary forms in order for us  to release information regarding their care.   Consent: Patient/Guardian gives verbal consent for treatment and assignment of benefits for services provided during this visit. Patient/Guardian expressed understanding and agreed to proceed.     Total encounter time 17 minutes which includes face-to-face time, chart reviewed, care coordination, order entry and documentation during this encounter.   Note: This document was prepared by [employer] voice dictation technology and any errors that results from this process are unintentional.    Arturo Late, MD 12/30/2023

## 2023-12-30 NOTE — Telephone Encounter (Signed)
 Ok. Clarification sent.

## 2023-12-30 NOTE — Telephone Encounter (Signed)
 [facility] for clarification of the Hydroxyzine HCL 25 mg SIG. Should it be TID PRN? As previous script? Did not see specification in visit note from today.

## 2024-01-28 ENCOUNTER — Encounter: Payer: Self-pay | Admitting: Pulmonary Disease

## 2024-01-28 ENCOUNTER — Ambulatory Visit: Payer: PRIVATE HEALTH INSURANCE | Admitting: Pulmonary Disease

## 2024-01-28 VITALS — BP 113/72 | HR 69 | Ht 66.0 in | Wt 185.0 lb

## 2024-01-28 DIAGNOSIS — R053 Chronic cough: Secondary | ICD-10-CM

## 2024-01-28 DIAGNOSIS — J4520 Mild intermittent asthma, uncomplicated: Secondary | ICD-10-CM | POA: Diagnosis not present

## 2024-01-28 DIAGNOSIS — R0982 Postnasal drip: Secondary | ICD-10-CM

## 2024-01-28 DIAGNOSIS — J4540 Moderate persistent asthma, uncomplicated: Secondary | ICD-10-CM

## 2024-01-28 DIAGNOSIS — R1310 Dysphagia, unspecified: Secondary | ICD-10-CM

## 2024-01-28 MED ORDER — DULERA 100-5 MCG/ACT IN AERO: 2.0000 | INHALATION_SPRAY | Freq: Two times a day (BID) | RESPIRATORY_TRACT | 11 refills | Status: AC | PRN

## 2024-01-28 MED ORDER — PROMETHAZINE-DM 6.25-15 MG/5ML PO SYRP: ORAL_SOLUTION | ORAL | 1 refills | Status: DC

## 2024-01-28 MED ORDER — FLUTICASONE PROPIONATE 50 MCG/ACT NA SUSP: 1.0000 | Freq: Every day | NASAL | 2 refills | Status: AC

## 2024-01-28 MED ORDER — IPRATROPIUM BROMIDE 0.03 % NA SOLN: 2.0000 | Freq: Two times a day (BID) | NASAL | 12 refills | Status: AC

## 2024-01-28 MED ORDER — COMBIVENT RESPIMAT 20-100 MCG/ACT IN AERS: 1.0000 | INHALATION_SPRAY | Freq: Four times a day (QID) | RESPIRATORY_TRACT | 11 refills | Status: AC | PRN

## 2024-01-28 NOTE — Progress Notes (Signed)
 Synopsis: Referred in January 2022 for cough  Subjective:   PATIENT ID: Hayley [name] GENDER: female DOB: 1946-01-04, MRN: [id number]  HPI  Chief Complaint  Patient presents with   Follow-up    Pt states needs refills    Hayley [name] is a 78 year old woman, former smoker with spasmodic dysphonia, GERD and sleep apnea who returns to pulmonary clinic for cough.   She has been doing well since last visit. She had cancelled a lot of her follow up doctors visits last year due to trying to control her expenses. She has been without dulera  and albuterol  inhalers over recent months. She reports the dulera  did help her cough and breathing after starting it.   She experiences persistent nasal drainage, which she believes exacerbates her cough. She uses a nasal spray regularly and takes Claritin  for these symptoms. She has no nighttime awakenings due to cough or drainage and sleeps well. Occasionally, she uses a cough suspension at night if her cough flares up.  OV 03/07/22 Had episode of cough, wheezing and dyspnea on 6/12 that did not respond to a nebulizer treatment or tessalon  perles. Breathing is much better as the episode self resolved.   She reports digestive flare ups over the past week and a half. She has woken up to vomiting and had diarrhea a week and a half ago. Continues to have loose bowels. She is not eating much as it is not appetizing.   Has post nasal drainage.   Using O2 at night.   OV 10/13/20 She was previously seen by Dr. Elverna Hamman in 2018 and was treated for concern of bronchiectasis given her cough symptoms at that time.   She reports over the past 4 months her cough has become more constant and she has developed right sided chest discomfort from coughing. She does report some wheezing. She uses combivent as needed with some relief. She feels like her throat is irritated and continues to trigger her to cough but she also feels she has chest congestion at times. She complains of  sinus congestion and runny nose. She also complains of difficulty swallowing and MBS in 2017 indicated a mild aspiration risk.    Past Medical History:  Diagnosis Date   Anemia    Anxiety    Bronchitis    hx of    Clotting disorder (HCC) 02-17-14   S/p appendectomy-developed Pulmonary emboli-tx. warfarin-d/c 1 yr ago.   Colon polyps    Depression    Diabetes mellitus without complication (HCC)    "borderline"- oral med   Disorder of vocal cords    spasmotic dysphonia ,02-17-14 has" whispery voice-low tone"   Hyperthyroidism    Kidney stones    Migraines    Nausea Nov 15, 2014   cycle of migraine headaches and nausea   Neuromuscular disorder (HCC)    hands and throat"spasmodic dysphonia", tremors - thimb and forefinger    Obesity, Class III, BMI 40-49.9 (morbid obesity) 04/27/2010   02-17-14 reports some weight loss- intentional   Osteoarthritis    in back(chronic pain)   Pelvic pain    02-17-14 "states thinks its back related on left groin"   Pulmonary emboli [facility])    s/p Appendectomy '13 [facility]   Reflux    Right knee pain    Sleep apnea    cpap - settings at 4    Toxic goiter      Family History  Problem Relation Age of Onset   Heart disease Mother  Pneumonia Father    Cirrhosis Father    Hypertension Father    Alcoholism Father    Heart attack Paternal Grandfather    Heart attack Maternal Grandfather    Heart attack Paternal Grandmother    Heart attack Maternal Grandmother    Breast cancer Daughter      Social History   Socioeconomic History   Marital status: Widowed    Spouse name: Not on file   Number of children: 5   Years of education: 24   Highest education level: Not on file  Occupational History   Occupation: Retired  Tobacco Use   Smoking status: Former    Current packs/day: 0.00    Average packs/day: 0.3 packs/day for 5.0 years (1.3 ttl pk-yrs)    Types: Cigarettes    Start date: 01/03/1977    Quit date: 01/03/1982    Years since quitting: 42.0    Smokeless tobacco: Never  Vaping Use   Vaping status: Never Used  Substance and Sexual Activity   Alcohol use: No   Drug use: No   Sexual activity: Not Currently  Other Topics Concern   Not on file  Social History Narrative   Lives at home alone at this time.   Right-handed.   2 cups caffeine  per day.   Social Drivers of Health   Financial Resource Strain: Medium Risk (01/21/2018)   Overall Financial Resource Strain (CARDIA)    Difficulty of Paying Living Expenses: Somewhat hard  Food Insecurity: Food Insecurity Present (09/07/2022)   Hunger Vital Sign    Worried About Running Out of Food in the Last Year: Sometimes true    Ran Out of Food in the Last Year: Sometimes true  Transportation Needs: Unmet Transportation Needs (09/06/2022)   PRAPARE - Transportation    Lack of Transportation (Medical): No    Lack of Transportation (Non-Medical): Yes  Physical Activity: Inactive (01/21/2018)   Exercise Vital Sign    Days of Exercise per Week: 0 days    Minutes of Exercise per Session: 0 min  Stress: Stress Concern Present (01/21/2018)   [employer] of Occupational Health - Occupational Stress Questionnaire    Feeling of Stress : Very much  Social Connections: Unknown (01/28/2022)   Received from [facility], [facility]   Social Network    Social Network: Not on file  Intimate Partner Violence: Unknown (12/20/2021)   Received from [employer], [facility]   HITS    Physically Hurt: Not on file    Insult or Talk Down To: Not on file    Threaten Physical Harm: Not on file    Scream or Curse: Not on file     Allergies  Allergen Reactions   Bee Venom Anaphylaxis   Morphine  Other (See Comments)    Hallucinations   Morphine  And Codeine     Tape Rash    Rash - only use paper tape   Etodolac Diarrhea and Nausea And Vomiting   Other Itching and Swelling    Bee venom   Penicillins Rash    Has patient had a PCN reaction causing immediate rash, facial/tongue/throat  swelling, SOB or lightheadedness with hypotension: Yes Has patient had a PCN reaction causing severe rash involving mucus membranes or skin necrosis: No Has patient had a PCN reaction that required hospitalization No Has patient had a PCN reaction occurring within the last 10 years: No If all of the above answers are "NO", then may proceed with Cephalosporin use.    Poison Ivy Extract  [  Poison Ivy Extract] Itching and Swelling   Propoxyphene Nausea And Vomiting   Propoxyphene N-Acetaminophen  Nausea And Vomiting     Outpatient Medications Prior to Visit  Medication Sig Dispense Refill   albuterol  (PROVENTIL ) (2.5 MG/3ML) 0.083% nebulizer solution Take 2.5 mg by nebulization every 4 (four) hours as needed for wheezing or shortness of breath.     apixaban  (ELIQUIS ) 2.5 MG TABS tablet Take 1 tablet (2.5 mg total) by mouth 2 (two) times daily. 180 tablet 4   clindamycin (CLEOCIN) 300 MG capsule Take 600 mg by mouth once.     diclofenac  (FLECTOR ) 1.3 % PTCH Place 1 patch onto the skin daily. (Patient taking differently: Place 1 patch onto the skin daily as needed (Joint and muscle pain).) 20 patch 1   HYDROcodone -acetaminophen  (NORCO) 10-325 MG tablet Take 1 tablet by mouth every 6 (six) hours.     hydrOXYzine  (ATARAX ) 25 MG tablet Take 1 tablet (25 mg total) by mouth as needed for anxiety, itching or nausea. 1 tablet every day prn 20 tablet 0   hyoscyamine (ANASPAZ) 0.125 MG TBDP disintergrating tablet Place 0.125 mg under the tongue every 4 (four) hours as needed for cramping (Disgestion).     ipratropium (ATROVENT ) 0.03 % nasal spray Place 2 sprays into both nostrils every 12 (twelve) hours. 30 mL 12   Ipratropium-Albuterol  (COMBIVENT IN) Inhale 1 application. into the lungs daily as needed (Wheezing).     levothyroxine  (SYNTHROID , LEVOTHROID) 25 MCG tablet Take 25 mcg by mouth every morning.      LORazepam  (ATIVAN ) 0.5 MG tablet Take 1 tablet (0.5 mg total) by mouth daily as needed for anxiety.  15 tablet 0   mometasone -formoterol  (DULERA ) 100-5 MCG/ACT AERO Inhale 2 puffs into the lungs 2 (two) times daily as needed for wheezing or shortness of breath. 1 each 6   nystatin  (MYCOSTATIN ) 100000 UNIT/ML suspension Use as directed in the mouth or throat daily as needed (Thrush).     promethazine -dextromethorphan  (PROMETHAZINE -DM) 6.25-15 MG/5ML syrup TAKE 5 MILLILITERS BY MOUTH EVERY 6 HOURS AS NEEDED 118 mL 1   propranolol  (INDERAL ) 10 MG tablet Take 1 tablet (10 mg total) by mouth 2 (two) times daily as needed. (Patient taking differently: Take 10 mg by mouth 2 (two) times daily as needed. 30 in before going out) 60 tablet 11   propranolol  ER (INDERAL  LA) 120 MG 24 hr capsule TAKE 1 CAPSULE BY MOUTH EVERY DAY 90 capsule 3   Rimegepant Sulfate (NURTEC) 75 MG TBDP TAKE 1 TAB AT ONSET OF MIGRAINE. MAY REPEAT IN 2 HRS, IF NEEDED. MAX DOSE: 2 TABS/DAY. 8 tablet 11   simethicone  (MYLICON) 80 MG chewable tablet Chew 1 tablet (80 mg total) by mouth 4 (four) times daily as needed for flatulence (belching). 30 tablet 0   tiZANidine (ZANAFLEX) 4 MG tablet Take 4 mg by mouth at bedtime as needed for muscle spasms.     traZODone  (DESYREL ) 150 MG tablet Take 1 tablet (150 mg total) by mouth at bedtime. 90 tablet 0   valACYclovir  (VALTREX ) 1000 MG tablet Take 2,000 mg by mouth 2 (two) times daily as needed (Fever blisters). Reported on 01/10/2016  2   vortioxetine  HBr (TRINTELLIX ) 20 MG TABS tablet Take 1 tablet (20 mg total) by mouth daily. 90 tablet 0   Facility-Administered Medications Prior to Visit  Medication Dose Route Frequency Provider Last Rate Last Admin   botulinum toxin Type A  (BOTOX ) injection 155 Units  155 Units Intramuscular Q90 days Wess Hammed, NP  155 Units at 12/25/22 0954   Review of Systems  Constitutional:  Negative for chills, fever, malaise/fatigue and weight loss.  HENT:  Positive for congestion. Negative for sinus pain and sore throat.   Eyes: Negative.   Respiratory:   Positive for cough, sputum production and shortness of breath. Negative for hemoptysis and wheezing.   Cardiovascular:  Negative for chest pain, palpitations, orthopnea, claudication and leg swelling.  Gastrointestinal:  Negative for abdominal pain, heartburn, nausea and vomiting.  Genitourinary: Negative.   Musculoskeletal:  Negative for joint pain and myalgias.  Skin:  Negative for rash.  Neurological:  Negative for weakness.  Endo/Heme/Allergies: Negative.     Objective:   Vitals:   01/28/24 1031  BP: 113/72  Pulse: 69  SpO2: 91%  Weight: 185 lb (83.9 kg)  Height: 5\' 6"  (1.676 m)    Physical Exam Constitutional:      General: She is not in acute distress.    Appearance: She is obese. She is not ill-appearing.  HENT:     Head: Normocephalic and atraumatic.  Eyes:     General: No scleral icterus.    Conjunctiva/sclera: Conjunctivae normal.  Cardiovascular:     Rate and Rhythm: Normal rate and regular rhythm.     Pulses: Normal pulses.     Heart sounds: Normal heart sounds. No murmur heard. Pulmonary:     Effort: Pulmonary effort is normal.     Breath sounds: Normal breath sounds. No wheezing, rhonchi or rales.  Musculoskeletal:     Right lower leg: No edema.     Left lower leg: No edema.  Neurological:     Mental Status: She is alert.    CBC    Component Value Date/Time   WBC 4.3 10/10/2022 1820   RBC 4.12 10/10/2022 1820   HGB 10.5 (L) 10/10/2022 1820   HGB 14.0 12/25/2017 1217   HGB 14.1 12/12/2016 1105   HCT 34.2 (L) 10/10/2022 1820   HCT 43.5 12/12/2016 1105   PLT 190 10/10/2022 1820   PLT 227 12/25/2017 1217   PLT 239 12/12/2016 1105   MCV 83.0 10/10/2022 1820   MCV 86.7 12/12/2016 1105   MCH 25.5 (L) 10/10/2022 1820   MCHC 30.7 10/10/2022 1820   RDW 14.3 10/10/2022 1820   RDW 14.4 12/12/2016 1105   LYMPHSABS 1.1 10/13/2020 1058   LYMPHSABS 1.4 12/12/2016 1105   MONOABS 0.5 10/13/2020 1058   MONOABS 0.6 12/12/2016 1105   EOSABS 0.1 10/13/2020  1058   EOSABS 0.3 12/12/2016 1105   BASOSABS 0.0 10/13/2020 1058   BASOSABS 0.0 12/12/2016 1105      Latest Ref Rng & Units 10/10/2022    6:20 PM 04/04/2019    3:59 AM 04/01/2019    4:07 AM  BMP  Glucose 70 - 99 mg/dL 85   191   BUN 8 - 23 mg/dL 9   7   Creatinine 4.78 - 1.00 mg/dL 2.95  6.21  3.08   Sodium 135 - 145 mmol/L 137   139   Potassium 3.5 - 5.1 mmol/L 2.9   3.6   Chloride 98 - 111 mmol/L 106   102   CO2 22 - 32 mmol/L 24   28   Calcium 8.9 - 10.3 mg/dL 6.7   8.7    Chest imaging: CXR 10/03/20 Cardiac shadow is within normal limits. Spinal stimulator is noted. Bilateral breast implants are seen. No focal infiltrate or effusion is noted. Degenerative changes of the thoracic spine seen.  CTA  Chest 04/01/2019 1. No evidence of large central or segmental pulmonary embolus. Respiratory motion may limit detection of smaller subsegmental pulmonary emboli. 2. Bandlike atelectatic changes are present at both bases. Geographic regions of mosaic attenuation are likely related to imaging during exhalation. No suspicious pulmonary nodules or masses. 3. Coronary atherosclerosis. 4.  Aortic Atherosclerosis  HRCT Chest 2018: 1. Scarring in the lingula and both lower lobes. No evidence of interstitial lung disease. 2. Aortic atherosclerosis (ICD10-170.0). Moderate coronary artery calcification. 3. Hepatic steatosis. Probable intense fat deposition within the caudate. Difficult to exclude a true lesion. If further evaluation is desired, MR abdomen without and with contrast is recommended.  PFT:     No data to display         Echo: 04/01/2019  1. The left ventricle has normal systolic function, with an ejection  fraction of 55-60%. The cavity size was normal. Left ventricular diastolic  Doppler parameters are consistent with impaired relaxation.   2. The right ventricle has normal systolic function. The cavity was  normal. There is no increase in right ventricular wall  thickness.   3. Left atrial size was mildly dilated.   4. Right atrial size was mildly dilated.   5. No stenosis of the aortic valve.   6. The aortic root and ascending aorta are normal in size and structure.   7. The interatrial septum was not assessed.   EGD 2011: Small hiatal hernia  MBS 2017 Speech Therapy Mild Aspiration Risk  Assessment & Plan:   Moderate persistent asthma without complication - Plan: Pulmonary Function Test  Post-nasal drainage  Dysphagia, unspecified type  Discussion: Hayley [name] is a 78 year old woman, former smoker with spasmodic dysphonia, GERD and sleep apnea who returns to pulmonary clinic for cough.   She is to continue dulera  100-29mcg 2 puffs twice daily and monitor for improvement in her cough and intermittent wheezing. She can continue PRN albuterol  and nebulizer treatments.   She is to use ipratropium nasal spray and fluticasone  nasal spray for post-nasal drainage.   Refills sent for inhalers, nasal sprays and cough syrup.  She has ongoing dysphagia and would benefit from further speech therapy evaluation but she wishes to hold off on that at this time.  Follow up in 1 year with PFTs  Duaine German, MD [facility] Office: [phone]   Current Outpatient Medications:    albuterol  (PROVENTIL ) (2.5 MG/3ML) 0.083% nebulizer solution, Take 2.5 mg by nebulization every 4 (four) hours as needed for wheezing or shortness of breath., Disp: , Rfl:    apixaban  (ELIQUIS ) 2.5 MG TABS tablet, Take 1 tablet (2.5 mg total) by mouth 2 (two) times daily., Disp: 180 tablet, Rfl: 4   clindamycin (CLEOCIN) 300 MG capsule, Take 600 mg by mouth once., Disp: , Rfl:    diclofenac  (FLECTOR ) 1.3 % PTCH, Place 1 patch onto the skin daily. (Patient taking differently: Place 1 patch onto the skin daily as needed (Joint and muscle pain).), Disp: 20 patch, Rfl: 1   HYDROcodone -acetaminophen  (NORCO) 10-325 MG tablet, Take 1 tablet by mouth every  6 (six) hours., Disp: , Rfl:    hydrOXYzine  (ATARAX ) 25 MG tablet, Take 1 tablet (25 mg total) by mouth as needed for anxiety, itching or nausea. 1 tablet every day prn, Disp: 20 tablet, Rfl: 0   hyoscyamine (ANASPAZ) 0.125 MG TBDP disintergrating tablet, Place 0.125 mg under the tongue every 4 (four) hours as needed for cramping (Disgestion)., Disp: , Rfl:  ipratropium (ATROVENT ) 0.03 % nasal spray, Place 2 sprays into both nostrils every 12 (twelve) hours., Disp: 30 mL, Rfl: 12   Ipratropium-Albuterol  (COMBIVENT IN), Inhale 1 application. into the lungs daily as needed (Wheezing)., Disp: , Rfl:    levothyroxine  (SYNTHROID , LEVOTHROID) 25 MCG tablet, Take 25 mcg by mouth every morning. , Disp: , Rfl:    LORazepam  (ATIVAN ) 0.5 MG tablet, Take 1 tablet (0.5 mg total) by mouth daily as needed for anxiety., Disp: 15 tablet, Rfl: 0   mometasone -formoterol  (DULERA ) 100-5 MCG/ACT AERO, Inhale 2 puffs into the lungs 2 (two) times daily as needed for wheezing or shortness of breath., Disp: 1 each, Rfl: 6   nystatin  (MYCOSTATIN ) 100000 UNIT/ML suspension, Use as directed in the mouth or throat daily as needed (Thrush)., Disp: , Rfl:    promethazine -dextromethorphan  (PROMETHAZINE -DM) 6.25-15 MG/5ML syrup, TAKE 5 MILLILITERS BY MOUTH EVERY 6 HOURS AS NEEDED, Disp: 118 mL, Rfl: 1   propranolol  (INDERAL ) 10 MG tablet, Take 1 tablet (10 mg total) by mouth 2 (two) times daily as needed. (Patient taking differently: Take 10 mg by mouth 2 (two) times daily as needed. 30 in before going out), Disp: 60 tablet, Rfl: 11   propranolol  ER (INDERAL  LA) 120 MG 24 hr capsule, TAKE 1 CAPSULE BY MOUTH EVERY DAY, Disp: 90 capsule, Rfl: 3   Rimegepant Sulfate (NURTEC) 75 MG TBDP, TAKE 1 TAB AT ONSET OF MIGRAINE. MAY REPEAT IN 2 HRS, IF NEEDED. MAX DOSE: 2 TABS/DAY., Disp: 8 tablet, Rfl: 11   simethicone  (MYLICON) 80 MG chewable tablet, Chew 1 tablet (80 mg total) by mouth 4 (four) times daily as needed for flatulence (belching).,  Disp: 30 tablet, Rfl: 0   tiZANidine (ZANAFLEX) 4 MG tablet, Take 4 mg by mouth at bedtime as needed for muscle spasms., Disp: , Rfl:    traZODone  (DESYREL ) 150 MG tablet, Take 1 tablet (150 mg total) by mouth at bedtime., Disp: 90 tablet, Rfl: 0   valACYclovir  (VALTREX ) 1000 MG tablet, Take 2,000 mg by mouth 2 (two) times daily as needed (Fever blisters). Reported on 01/10/2016, Disp: , Rfl: 2   vortioxetine  HBr (TRINTELLIX ) 20 MG TABS tablet, Take 1 tablet (20 mg total) by mouth daily., Disp: 90 tablet, Rfl: 0  Current Facility-Administered Medications:    botulinum toxin Type A  (BOTOX ) injection 155 Units, 155 Units, Intramuscular, Q90 days, Wess Hammed, NP, 155 Units at 12/25/22 [phone]

## 2024-01-28 NOTE — Patient Instructions (Signed)
 Continue Dulera  inhaler 2 puffs twice daily - rinse mouth out after each use  Use combivent inhaler 1-2 puffs every 6 hours as needed  Use flonase  nasal spray, 1 spray per nostril daily  Use atrovent  (ipratropium) nasal spray, 2 sprays per nostril twice daily  Follow up in 1 year with pulmonary function tests

## 2024-02-18 ENCOUNTER — Other Ambulatory Visit (HOSPITAL_COMMUNITY): Payer: Self-pay | Admitting: Gastroenterology

## 2024-02-18 DIAGNOSIS — R109 Unspecified abdominal pain: Secondary | ICD-10-CM | POA: Diagnosis not present

## 2024-02-18 DIAGNOSIS — K580 Irritable bowel syndrome with diarrhea: Secondary | ICD-10-CM | POA: Diagnosis not present

## 2024-02-18 DIAGNOSIS — Z860100 Personal history of colon polyps, unspecified: Secondary | ICD-10-CM | POA: Diagnosis not present

## 2024-02-18 DIAGNOSIS — K5730 Diverticulosis of large intestine without perforation or abscess without bleeding: Secondary | ICD-10-CM | POA: Diagnosis not present

## 2024-02-18 DIAGNOSIS — R1033 Periumbilical pain: Secondary | ICD-10-CM

## 2024-02-20 DIAGNOSIS — Z471 Aftercare following joint replacement surgery: Secondary | ICD-10-CM | POA: Diagnosis not present

## 2024-02-20 DIAGNOSIS — Z96651 Presence of right artificial knee joint: Secondary | ICD-10-CM | POA: Diagnosis not present

## 2024-02-21 ENCOUNTER — Encounter (HOSPITAL_BASED_OUTPATIENT_CLINIC_OR_DEPARTMENT_OTHER): Payer: Self-pay

## 2024-02-21 ENCOUNTER — Ambulatory Visit (HOSPITAL_BASED_OUTPATIENT_CLINIC_OR_DEPARTMENT_OTHER): Admission: RE | Admit: 2024-02-21 | Source: Ambulatory Visit

## 2024-02-23 ENCOUNTER — Ambulatory Visit (HOSPITAL_BASED_OUTPATIENT_CLINIC_OR_DEPARTMENT_OTHER)
Admission: RE | Admit: 2024-02-23 | Discharge: 2024-02-23 | Disposition: A | Discharge status: Home / Self Care | Source: Ambulatory Visit | Attending: Gastroenterology | Admitting: Gastroenterology

## 2024-02-23 DIAGNOSIS — R1033 Periumbilical pain: Secondary | ICD-10-CM | POA: Diagnosis not present

## 2024-02-23 DIAGNOSIS — K5730 Diverticulosis of large intestine without perforation or abscess without bleeding: Secondary | ICD-10-CM | POA: Diagnosis not present

## 2024-02-23 LAB — POCT I-STAT CREATININE: Creatinine, Ser: 0.8 mg/dL (ref 0.44–1.00)

## 2024-02-23 MED ORDER — IOHEXOL 300 MG/ML  SOLN
100.0000 mL | Freq: Once | INTRAMUSCULAR | Status: AC | PRN
  Administered 2024-02-23: 80 mL via INTRAVENOUS

## 2024-02-25 DIAGNOSIS — M5412 Radiculopathy, cervical region: Secondary | ICD-10-CM | POA: Diagnosis not present

## 2024-03-09 DIAGNOSIS — M6281 Muscle weakness (generalized): Secondary | ICD-10-CM | POA: Diagnosis not present

## 2024-03-09 DIAGNOSIS — M25561 Pain in right knee: Secondary | ICD-10-CM | POA: Diagnosis not present

## 2024-03-27 ENCOUNTER — Other Ambulatory Visit (HOSPITAL_COMMUNITY): Payer: Self-pay | Admitting: Psychiatry

## 2024-03-27 DIAGNOSIS — F322 Major depressive disorder, single episode, severe without psychotic features: Secondary | ICD-10-CM

## 2024-03-30 ENCOUNTER — Encounter (HOSPITAL_COMMUNITY): Payer: Self-pay | Admitting: Psychiatry

## 2024-03-30 ENCOUNTER — Telehealth (HOSPITAL_BASED_OUTPATIENT_CLINIC_OR_DEPARTMENT_OTHER): Admitting: Psychiatry

## 2024-03-30 ENCOUNTER — Telehealth (HOSPITAL_COMMUNITY): Payer: Self-pay

## 2024-03-30 VITALS — Wt 180.0 lb

## 2024-03-30 DIAGNOSIS — F419 Anxiety disorder, unspecified: Secondary | ICD-10-CM

## 2024-03-30 DIAGNOSIS — R251 Tremor, unspecified: Secondary | ICD-10-CM | POA: Diagnosis not present

## 2024-03-30 DIAGNOSIS — F322 Major depressive disorder, single episode, severe without psychotic features: Secondary | ICD-10-CM

## 2024-03-30 MED ORDER — LORAZEPAM 0.5 MG PO TABS: 0.5000 mg | ORAL_TABLET | Freq: Every day | ORAL | 0 refills | Status: DC | PRN

## 2024-03-30 MED ORDER — TRAZODONE HCL 150 MG PO TABS
150.0000 mg | ORAL_TABLET | Freq: Every day | ORAL | 0 refills | Status: DC
Start: 2024-03-30 — End: 2024-07-01

## 2024-03-30 MED ORDER — HYDROXYZINE HCL 25 MG PO TABS
25.0000 mg | ORAL_TABLET | ORAL | 0 refills | Status: DC | PRN
Start: 2024-03-30 — End: 2024-07-01

## 2024-03-30 MED ORDER — VORTIOXETINE HBR 20 MG PO TABS: 20.0000 mg | ORAL_TABLET | Freq: Every day | ORAL | 0 refills | Status: DC

## 2024-03-30 NOTE — Telephone Encounter (Signed)
 Per Dr. Curry I called the patient and let her know she can come and pick up samples, she will also fill out patient assistance forms. I called the pharmacy and they did not have her insurance information

## 2024-03-30 NOTE — Progress Notes (Signed)
 [facility] MD Virtual Progress Note   Patient Location: Home Provider Location: Home Office  I connect with patient by telephone and verified that I am speaking with correct person by using two identifiers. I discussed the limitations of evaluation and management by telemedicine and the availability of in person appointments. I also discussed with the patient that there may be a patient responsible charge related to this service. The patient expressed understanding and agreed to proceed.  Hayley [name] [id number] 78 y.o.  03/30/2024 9:54 AM  History of Present Illness:  Patient is evaluated by phone session.  She could not login for video.  She reported lately feeling sad depressed and anxious because she did not have enough money to pay the co-pay to get her contracts.  Her insurance does not cover the medication.  She reported feeling tired, fatigue.  We have recommended to contact our office if we have samples and submit patient assistance program but she did not call or came to the office.  Due to her chronic tremor she cannot write very well and wondering if one of her staff can help application.  We have suggested she come to our office in the past.  Patient apologized and forgot but promised to try to come as she feels without the Trintellix  she is feeling sad and depressed.  She denies any crying spells but lack of motivation to do things.  She denies any suicidal thoughts.  She admitted few pounds weight gain and sleep not very well but denies any hopelessness or worthlessness.  She has tremors.  Recently busy with other providers for her general health.  She reported financial distress.  She like to keep the trazodone  which is helping some sleep.  She will require to remove spinal stimulator so she can have DBS for the tremors.  She is not sure how it would happen because she does not have the money for the procedure and co-pay.  She does walk every day and that helps..  She has  taken few times Ativan  and hydroxyzine  which helps anxiety.  She does not want to try any other medication to help with depression as in the past she had tried few which did not help or actually cause worsening of symptoms.  Past Psychiatric History: H/O depression and anxiety.  H/O suicidal attempt on Percocet in March 2019.  Took Effexor in her 72s but stopped after working for a while.  Tried Zoloft , Paxil and Wellbutrin  from PCP but did not work. Did IOP, PHP and given Prozac  but no improvement. Tried lexapro  caused GI s/e, Lamictal  caused increase tremors.     Outpatient Encounter Medications as of 03/30/2024  Medication Sig   albuterol  (PROVENTIL ) (2.5 MG/3ML) 0.083% nebulizer solution Take 2.5 mg by nebulization every 4 (four) hours as needed for wheezing or shortness of breath.   apixaban  (ELIQUIS ) 2.5 MG TABS tablet Take 1 tablet (2.5 mg total) by mouth 2 (two) times daily.   clindamycin (CLEOCIN) 300 MG capsule Take 600 mg by mouth once.   diclofenac  (FLECTOR ) 1.3 % PTCH Place 1 patch onto the skin daily. (Patient taking differently: Place 1 patch onto the skin daily as needed (Joint and muscle pain).)   fluticasone  (FLONASE ) 50 MCG/ACT nasal spray Place 1 spray into both nostrils daily.   HYDROcodone -acetaminophen  (NORCO) 10-325 MG tablet Take 1 tablet by mouth every 6 (six) hours.   hydrOXYzine  (ATARAX ) 25 MG tablet Take 1 tablet (25 mg total) by mouth as needed for  anxiety, itching or nausea. 1 tablet every day prn   hyoscyamine (ANASPAZ) 0.125 MG TBDP disintergrating tablet Place 0.125 mg under the tongue every 4 (four) hours as needed for cramping (Disgestion).   ipratropium (ATROVENT ) 0.03 % nasal spray Place 2 sprays into both nostrils every 12 (twelve) hours.   Ipratropium-Albuterol  (COMBIVENT  RESPIMAT) 20-100 MCG/ACT AERS respimat Inhale 1 puff into the lungs every 6 (six) hours as needed for wheezing.   levothyroxine  (SYNTHROID , LEVOTHROID) 25 MCG tablet Take 25 mcg by mouth  every morning.    LORazepam  (ATIVAN ) 0.5 MG tablet Take 1 tablet (0.5 mg total) by mouth daily as needed for anxiety.   mometasone -formoterol  (DULERA ) 100-5 MCG/ACT AERO Inhale 2 puffs into the lungs 2 (two) times daily as needed for wheezing or shortness of breath.   nystatin  (MYCOSTATIN ) 100000 UNIT/ML suspension Use as directed in the mouth or throat daily as needed (Thrush).   promethazine -dextromethorphan  (PROMETHAZINE -DM) 6.25-15 MG/5ML syrup TAKE 5 MILLILITERS BY MOUTH EVERY 6 HOURS AS NEEDED   propranolol  (INDERAL ) 10 MG tablet Take 1 tablet (10 mg total) by mouth 2 (two) times daily as needed. (Patient taking differently: Take 10 mg by mouth 2 (two) times daily as needed. 30 in before going out)   propranolol  ER (INDERAL  LA) 120 MG 24 hr capsule TAKE 1 CAPSULE BY MOUTH EVERY DAY   Rimegepant Sulfate (NURTEC) 75 MG TBDP TAKE 1 TAB AT ONSET OF MIGRAINE. MAY REPEAT IN 2 HRS, IF NEEDED. MAX DOSE: 2 TABS/DAY.   simethicone  (MYLICON) 80 MG chewable tablet Chew 1 tablet (80 mg total) by mouth 4 (four) times daily as needed for flatulence (belching).   tiZANidine (ZANAFLEX) 4 MG tablet Take 4 mg by mouth at bedtime as needed for muscle spasms.   traZODone  (DESYREL ) 150 MG tablet Take 1 tablet (150 mg total) by mouth at bedtime.   valACYclovir  (VALTREX ) 1000 MG tablet Take 2,000 mg by mouth 2 (two) times daily as needed (Fever blisters). Reported on 01/10/2016   vortioxetine  HBr (TRINTELLIX ) 20 MG TABS tablet Take 1 tablet (20 mg total) by mouth daily.   Facility-Administered Encounter Medications as of 03/30/2024  Medication   botulinum toxin Type A  (BOTOX ) injection 155 Units    Recent Results (from the past 2160 hours)  I-STAT creatinine     Status: None   Collection Time: 02/23/24 11:07 AM  Result Value Ref Range   Creatinine, Ser 0.80 0.44 - 1.00 mg/dL     Psychiatric Specialty Exam: Physical Exam  Review of Systems  Weight 180 lb (81.6 kg), last menstrual period 09/18/1979.There is  no height or weight on file to calculate BMI.  General Appearance: NA  Eye Contact:  NA  Speech:  Slow  Volume:  Decreased  Mood:  Anxious  Affect:  NA  Thought Process:  Goal Directed  Orientation:  Full (Time, Place, and Person)  Thought Content:  Rumination  Suicidal Thoughts:  No  Homicidal Thoughts:  No  Memory:  Immediate;   Fair Recent;   Fair Remote;   Fair  Judgement:  Fair  Insight:  Fair  Psychomotor Activity:  Tremor  Concentration:  Concentration: Fair and Attention Span: Fair  Recall:  [employer] of Knowledge:  Fair  Language:  Good  Akathisia:  No  Handed:  Right  AIMS (if indicated):     Assets:  Communication Skills Desire for Improvement Housing Transportation  ADL's:  Intact  Cognition:  WNL  Sleep:  fair  09/06/2022   11:41 AM 08/07/2019    1:08 PM 04/06/2019    1:27 PM  Depression screen PHQ 2/9  Decreased Interest 0 3 1  Down, Depressed, Hopeless 0 3 2  PHQ - 2 Score 0 6 3  Altered sleeping  3 1  Tired, decreased energy  3 1  Change in appetite  3 1  Feeling bad or failure about yourself   3 0  Trouble concentrating  3 1  Moving slowly or fidgety/restless  2 0  Suicidal thoughts  1 0  PHQ-9 Score  24 7  Difficult doing work/chores   Somewhat difficult    Assessment/Plan: MDD (major depressive episode), single episode, severe, no psychosis (HCC) - Plan: traZODone  (DESYREL ) 150 MG tablet, hydrOXYzine  (ATARAX ) 25 MG tablet, vortioxetine  HBr (TRINTELLIX ) 20 MG TABS tablet  Anxiety - Plan: LORazepam  (ATIVAN ) 0.5 MG tablet, hydrOXYzine  (ATARAX ) 25 MG tablet, vortioxetine  HBr (TRINTELLIX ) 20 MG TABS tablet  Tremor - Plan: hydrOXYzine  (ATARAX ) 25 MG tablet  Discussed chronic issues related to her finances.  Reminded that she need to call and come in her office to pick up the samples available and our staff can help to fill the patient assistance program since patient cannot write very well.  Patient promised that she will come as she  noticed the doctor and is not doing very well.  She like to keep the trazodone  150 mg at bedtime and hydroxyzine  25 mg and Ativan  0.5 mg as needed for anxiety.  She is hoping once she is able to get the DBS procedure her tremors get better but she has to fulfill the requirement and, from spinal stimulator before the procedure.  We have discussed therapy in the past but patient is not able to afford.  Will contact office to coordinate Trintellix  20 mg samples and patient assistant program.  Encouraged to call back if symptoms continue to get worse or anytime having suicidal thoughts continue to call [phone] one of the local emergency room.  Follow-up in 3 months.   Follow Up Instructions:     I discussed the assessment and treatment plan with the patient. The patient was provided an opportunity to ask questions and all were answered. The patient agreed with the plan and demonstrated an understanding of the instructions.   The patient was advised to call back or seek an in-person evaluation if the symptoms worsen or if the condition fails to improve as anticipated.    Collaboration of Care: Other provider involved in patient's care AEB notes are available in epic to review  Patient/Guardian was advised Release of Information must be obtained prior to any record release in order to collaborate their care with an outside provider. Patient/Guardian was advised if they have not already done so to contact the registration department to sign all necessary forms in order for us  to release information regarding their care.   Consent: Patient/Guardian gives verbal consent for treatment and assignment of benefits for services provided during this visit. Patient/Guardian expressed understanding and agreed to proceed.     Total encounter time 29 minutes which includes face-to-face time, chart reviewed, care coordination, order entry and documentation during this encounter.   Note: This document was prepared by  [employer] voice dictation technology and any errors that results from this process are unintentional.    Leni ONEIDA Client, MD 03/30/2024

## 2024-03-31 DIAGNOSIS — Z9689 Presence of other specified functional implants: Secondary | ICD-10-CM | POA: Diagnosis not present

## 2024-04-21 DIAGNOSIS — M5416 Radiculopathy, lumbar region: Secondary | ICD-10-CM | POA: Diagnosis not present

## 2024-04-21 DIAGNOSIS — M7918 Myalgia, other site: Secondary | ICD-10-CM | POA: Diagnosis not present

## 2024-04-21 DIAGNOSIS — Z5181 Encounter for therapeutic drug level monitoring: Secondary | ICD-10-CM | POA: Diagnosis not present

## 2024-04-21 DIAGNOSIS — M5412 Radiculopathy, cervical region: Secondary | ICD-10-CM | POA: Diagnosis not present

## 2024-04-29 DIAGNOSIS — Z01818 Encounter for other preprocedural examination: Secondary | ICD-10-CM | POA: Diagnosis not present

## 2024-04-29 DIAGNOSIS — Z79899 Other long term (current) drug therapy: Secondary | ICD-10-CM | POA: Diagnosis not present

## 2024-04-29 DIAGNOSIS — Z9689 Presence of other specified functional implants: Secondary | ICD-10-CM | POA: Diagnosis not present

## 2024-04-29 DIAGNOSIS — J4540 Moderate persistent asthma, uncomplicated: Secondary | ICD-10-CM | POA: Diagnosis not present

## 2024-04-29 DIAGNOSIS — Z86711 Personal history of pulmonary embolism: Secondary | ICD-10-CM | POA: Diagnosis not present

## 2024-04-29 DIAGNOSIS — Z86718 Personal history of other venous thrombosis and embolism: Secondary | ICD-10-CM | POA: Diagnosis not present

## 2024-04-29 DIAGNOSIS — Z7901 Long term (current) use of anticoagulants: Secondary | ICD-10-CM | POA: Diagnosis not present

## 2024-05-01 DIAGNOSIS — R9431 Abnormal electrocardiogram [ECG] [EKG]: Secondary | ICD-10-CM | POA: Diagnosis not present

## 2024-05-01 DIAGNOSIS — Z01810 Encounter for preprocedural cardiovascular examination: Secondary | ICD-10-CM | POA: Diagnosis not present

## 2024-05-04 DIAGNOSIS — Z01818 Encounter for other preprocedural examination: Secondary | ICD-10-CM | POA: Diagnosis not present

## 2024-05-04 DIAGNOSIS — R251 Tremor, unspecified: Secondary | ICD-10-CM | POA: Diagnosis not present

## 2024-05-04 DIAGNOSIS — R300 Dysuria: Secondary | ICD-10-CM | POA: Diagnosis not present

## 2024-05-04 DIAGNOSIS — Z86711 Personal history of pulmonary embolism: Secondary | ICD-10-CM | POA: Diagnosis not present

## 2024-05-04 DIAGNOSIS — M549 Dorsalgia, unspecified: Secondary | ICD-10-CM | POA: Diagnosis not present

## 2024-05-08 DIAGNOSIS — Z79899 Other long term (current) drug therapy: Secondary | ICD-10-CM | POA: Diagnosis not present

## 2024-05-08 DIAGNOSIS — F419 Anxiety disorder, unspecified: Secondary | ICD-10-CM | POA: Diagnosis not present

## 2024-05-08 DIAGNOSIS — Z86711 Personal history of pulmonary embolism: Secondary | ICD-10-CM | POA: Diagnosis not present

## 2024-05-08 DIAGNOSIS — Z9682 Presence of neurostimulator: Secondary | ICD-10-CM | POA: Diagnosis not present

## 2024-05-08 DIAGNOSIS — E039 Hypothyroidism, unspecified: Secondary | ICD-10-CM | POA: Diagnosis not present

## 2024-05-08 DIAGNOSIS — Z7901 Long term (current) use of anticoagulants: Secondary | ICD-10-CM | POA: Diagnosis not present

## 2024-05-08 DIAGNOSIS — Z87891 Personal history of nicotine dependence: Secondary | ICD-10-CM | POA: Diagnosis not present

## 2024-05-08 DIAGNOSIS — Z4542 Encounter for adjustment and management of neuropacemaker (brain) (peripheral nerve) (spinal cord): Secondary | ICD-10-CM | POA: Diagnosis not present

## 2024-05-08 DIAGNOSIS — Z9689 Presence of other specified functional implants: Secondary | ICD-10-CM | POA: Diagnosis not present

## 2024-05-08 DIAGNOSIS — F32A Depression, unspecified: Secondary | ICD-10-CM | POA: Diagnosis not present

## 2024-05-08 DIAGNOSIS — G4733 Obstructive sleep apnea (adult) (pediatric): Secondary | ICD-10-CM | POA: Diagnosis not present

## 2024-05-08 DIAGNOSIS — G43709 Chronic migraine without aura, not intractable, without status migrainosus: Secondary | ICD-10-CM | POA: Diagnosis not present

## 2024-05-08 DIAGNOSIS — G894 Chronic pain syndrome: Secondary | ICD-10-CM | POA: Diagnosis not present

## 2024-05-08 DIAGNOSIS — G250 Essential tremor: Secondary | ICD-10-CM | POA: Diagnosis not present

## 2024-05-08 DIAGNOSIS — Z79890 Hormone replacement therapy (postmenopausal): Secondary | ICD-10-CM | POA: Diagnosis not present

## 2024-05-08 DIAGNOSIS — Y752 Prosthetic and other implants, materials and neurological devices associated with adverse incidents: Secondary | ICD-10-CM | POA: Diagnosis not present

## 2024-05-08 DIAGNOSIS — E119 Type 2 diabetes mellitus without complications: Secondary | ICD-10-CM | POA: Diagnosis not present

## 2024-05-08 DIAGNOSIS — J45909 Unspecified asthma, uncomplicated: Secondary | ICD-10-CM | POA: Diagnosis not present

## 2024-05-08 DIAGNOSIS — T85113A Breakdown (mechanical) of implanted electronic neurostimulator, generator, initial encounter: Secondary | ICD-10-CM | POA: Diagnosis not present

## 2024-05-08 DIAGNOSIS — I10 Essential (primary) hypertension: Secondary | ICD-10-CM | POA: Diagnosis not present

## 2024-05-08 DIAGNOSIS — Z85828 Personal history of other malignant neoplasm of skin: Secondary | ICD-10-CM | POA: Diagnosis not present

## 2024-05-09 DIAGNOSIS — G4733 Obstructive sleep apnea (adult) (pediatric): Secondary | ICD-10-CM | POA: Diagnosis not present

## 2024-05-09 DIAGNOSIS — F419 Anxiety disorder, unspecified: Secondary | ICD-10-CM | POA: Diagnosis not present

## 2024-05-09 DIAGNOSIS — Z7901 Long term (current) use of anticoagulants: Secondary | ICD-10-CM | POA: Diagnosis not present

## 2024-05-09 DIAGNOSIS — F32A Depression, unspecified: Secondary | ICD-10-CM | POA: Diagnosis not present

## 2024-05-09 DIAGNOSIS — Z87891 Personal history of nicotine dependence: Secondary | ICD-10-CM | POA: Diagnosis not present

## 2024-05-09 DIAGNOSIS — G894 Chronic pain syndrome: Secondary | ICD-10-CM | POA: Diagnosis not present

## 2024-05-09 DIAGNOSIS — T85113A Breakdown (mechanical) of implanted electronic neurostimulator, generator, initial encounter: Secondary | ICD-10-CM | POA: Diagnosis not present

## 2024-05-09 DIAGNOSIS — G250 Essential tremor: Secondary | ICD-10-CM | POA: Diagnosis not present

## 2024-05-09 DIAGNOSIS — I10 Essential (primary) hypertension: Secondary | ICD-10-CM | POA: Diagnosis not present

## 2024-05-09 DIAGNOSIS — E119 Type 2 diabetes mellitus without complications: Secondary | ICD-10-CM | POA: Diagnosis not present

## 2024-05-09 DIAGNOSIS — Z85828 Personal history of other malignant neoplasm of skin: Secondary | ICD-10-CM | POA: Diagnosis not present

## 2024-05-09 DIAGNOSIS — J45909 Unspecified asthma, uncomplicated: Secondary | ICD-10-CM | POA: Diagnosis not present

## 2024-05-09 DIAGNOSIS — Z86711 Personal history of pulmonary embolism: Secondary | ICD-10-CM | POA: Diagnosis not present

## 2024-05-09 DIAGNOSIS — E039 Hypothyroidism, unspecified: Secondary | ICD-10-CM | POA: Diagnosis not present

## 2024-05-09 DIAGNOSIS — Z79899 Other long term (current) drug therapy: Secondary | ICD-10-CM | POA: Diagnosis not present

## 2024-05-09 DIAGNOSIS — G43709 Chronic migraine without aura, not intractable, without status migrainosus: Secondary | ICD-10-CM | POA: Diagnosis not present

## 2024-05-09 DIAGNOSIS — Y752 Prosthetic and other implants, materials and neurological devices associated with adverse incidents: Secondary | ICD-10-CM | POA: Diagnosis not present

## 2024-05-09 DIAGNOSIS — Z79890 Hormone replacement therapy (postmenopausal): Secondary | ICD-10-CM | POA: Diagnosis not present

## 2024-05-10 DIAGNOSIS — Z79899 Other long term (current) drug therapy: Secondary | ICD-10-CM | POA: Diagnosis not present

## 2024-05-10 DIAGNOSIS — Z87891 Personal history of nicotine dependence: Secondary | ICD-10-CM | POA: Diagnosis not present

## 2024-05-10 DIAGNOSIS — Z86711 Personal history of pulmonary embolism: Secondary | ICD-10-CM | POA: Diagnosis not present

## 2024-05-10 DIAGNOSIS — Z85828 Personal history of other malignant neoplasm of skin: Secondary | ICD-10-CM | POA: Diagnosis not present

## 2024-05-10 DIAGNOSIS — F32A Depression, unspecified: Secondary | ICD-10-CM | POA: Diagnosis not present

## 2024-05-10 DIAGNOSIS — E119 Type 2 diabetes mellitus without complications: Secondary | ICD-10-CM | POA: Diagnosis not present

## 2024-05-10 DIAGNOSIS — G4733 Obstructive sleep apnea (adult) (pediatric): Secondary | ICD-10-CM | POA: Diagnosis not present

## 2024-05-10 DIAGNOSIS — E039 Hypothyroidism, unspecified: Secondary | ICD-10-CM | POA: Diagnosis not present

## 2024-05-10 DIAGNOSIS — Z7901 Long term (current) use of anticoagulants: Secondary | ICD-10-CM | POA: Diagnosis not present

## 2024-05-10 DIAGNOSIS — I10 Essential (primary) hypertension: Secondary | ICD-10-CM | POA: Diagnosis not present

## 2024-05-10 DIAGNOSIS — T85113A Breakdown (mechanical) of implanted electronic neurostimulator, generator, initial encounter: Secondary | ICD-10-CM | POA: Diagnosis not present

## 2024-05-10 DIAGNOSIS — F419 Anxiety disorder, unspecified: Secondary | ICD-10-CM | POA: Diagnosis not present

## 2024-05-10 DIAGNOSIS — J45909 Unspecified asthma, uncomplicated: Secondary | ICD-10-CM | POA: Diagnosis not present

## 2024-05-10 DIAGNOSIS — Y752 Prosthetic and other implants, materials and neurological devices associated with adverse incidents: Secondary | ICD-10-CM | POA: Diagnosis not present

## 2024-05-10 DIAGNOSIS — G250 Essential tremor: Secondary | ICD-10-CM | POA: Diagnosis not present

## 2024-05-10 DIAGNOSIS — G43709 Chronic migraine without aura, not intractable, without status migrainosus: Secondary | ICD-10-CM | POA: Diagnosis not present

## 2024-05-10 DIAGNOSIS — Z79890 Hormone replacement therapy (postmenopausal): Secondary | ICD-10-CM | POA: Diagnosis not present

## 2024-05-10 DIAGNOSIS — G894 Chronic pain syndrome: Secondary | ICD-10-CM | POA: Diagnosis not present

## 2024-05-21 DIAGNOSIS — G8918 Other acute postprocedural pain: Secondary | ICD-10-CM | POA: Diagnosis not present

## 2024-05-21 DIAGNOSIS — G250 Essential tremor: Secondary | ICD-10-CM | POA: Diagnosis not present

## 2024-05-21 DIAGNOSIS — R251 Tremor, unspecified: Secondary | ICD-10-CM | POA: Diagnosis not present

## 2024-05-21 DIAGNOSIS — G20A1 Parkinson's disease without dyskinesia, without mention of fluctuations: Secondary | ICD-10-CM | POA: Diagnosis not present

## 2024-05-27 DIAGNOSIS — R251 Tremor, unspecified: Secondary | ICD-10-CM | POA: Diagnosis not present

## 2024-05-27 DIAGNOSIS — G250 Essential tremor: Secondary | ICD-10-CM | POA: Diagnosis not present

## 2024-05-27 DIAGNOSIS — G20A1 Parkinson's disease without dyskinesia, without mention of fluctuations: Secondary | ICD-10-CM | POA: Diagnosis not present

## 2024-06-02 ENCOUNTER — Other Ambulatory Visit (HOSPITAL_COMMUNITY): Payer: Self-pay | Admitting: Psychiatry

## 2024-06-02 DIAGNOSIS — F419 Anxiety disorder, unspecified: Secondary | ICD-10-CM

## 2024-06-03 DIAGNOSIS — G250 Essential tremor: Secondary | ICD-10-CM | POA: Diagnosis not present

## 2024-06-03 DIAGNOSIS — G20A1 Parkinson's disease without dyskinesia, without mention of fluctuations: Secondary | ICD-10-CM | POA: Diagnosis not present

## 2024-06-03 DIAGNOSIS — R251 Tremor, unspecified: Secondary | ICD-10-CM | POA: Diagnosis not present

## 2024-06-04 ENCOUNTER — Telehealth (HOSPITAL_COMMUNITY): Payer: Self-pay | Admitting: *Deleted

## 2024-06-04 DIAGNOSIS — F419 Anxiety disorder, unspecified: Secondary | ICD-10-CM

## 2024-06-04 MED ORDER — LORAZEPAM 0.5 MG PO TABS: 0.5000 mg | ORAL_TABLET | Freq: Every day | ORAL | 0 refills | Status: DC | PRN

## 2024-06-04 NOTE — Telephone Encounter (Signed)
 Pt requests a refill of Lorazepam  0.5 mg. The last prescription was sent on 03/30/24 for #15. Pt says that she recently had the DBS procedure and is feeling anxious again.   Last visit: 03/30/24 Next visit: 07/01/24

## 2024-06-04 NOTE — Telephone Encounter (Signed)
 I have sent lorazepam  0.5 mg tablet to take as needed # 10 tablets.  She can pick up from pharmacy.

## 2024-06-24 DIAGNOSIS — Z23 Encounter for immunization: Secondary | ICD-10-CM | POA: Diagnosis not present

## 2024-06-24 DIAGNOSIS — E039 Hypothyroidism, unspecified: Secondary | ICD-10-CM | POA: Diagnosis not present

## 2024-06-24 DIAGNOSIS — E538 Deficiency of other specified B group vitamins: Secondary | ICD-10-CM | POA: Diagnosis not present

## 2024-06-24 DIAGNOSIS — E1169 Type 2 diabetes mellitus with other specified complication: Secondary | ICD-10-CM | POA: Diagnosis not present

## 2024-06-24 DIAGNOSIS — R5383 Other fatigue: Secondary | ICD-10-CM | POA: Diagnosis not present

## 2024-06-29 ENCOUNTER — Telehealth (HOSPITAL_COMMUNITY): Admitting: Psychiatry

## 2024-06-30 ENCOUNTER — Other Ambulatory Visit: Payer: Self-pay | Admitting: Neurology

## 2024-07-01 ENCOUNTER — Telehealth (HOSPITAL_COMMUNITY): Admitting: Psychiatry

## 2024-07-01 ENCOUNTER — Encounter (HOSPITAL_COMMUNITY): Payer: Self-pay | Admitting: Psychiatry

## 2024-07-01 VITALS — Wt 177.0 lb

## 2024-07-01 DIAGNOSIS — F322 Major depressive disorder, single episode, severe without psychotic features: Secondary | ICD-10-CM

## 2024-07-01 DIAGNOSIS — R251 Tremor, unspecified: Secondary | ICD-10-CM

## 2024-07-01 DIAGNOSIS — F419 Anxiety disorder, unspecified: Secondary | ICD-10-CM | POA: Diagnosis not present

## 2024-07-01 MED ORDER — HYDROXYZINE HCL 25 MG PO TABS: 25.0000 mg | ORAL_TABLET | ORAL | 0 refills | Status: DC | PRN

## 2024-07-01 MED ORDER — TRAZODONE HCL 150 MG PO TABS: 150.0000 mg | ORAL_TABLET | Freq: Every day | ORAL | 0 refills | Status: DC

## 2024-07-01 MED ORDER — VORTIOXETINE HBR 20 MG PO TABS: 20.0000 mg | ORAL_TABLET | Freq: Every day | ORAL | 0 refills | Status: DC

## 2024-07-01 NOTE — Progress Notes (Signed)
 [facility] MD Virtual Progress Note   Patient Location: In Car Provider Location: Home Office  I connect with patient by video and verified that I am speaking with correct person by using two identifiers. I discussed the limitations of evaluation and management by telemedicine and the availability of in person appointments. I also discussed with the patient that there may be a patient responsible charge related to this service. The patient expressed understanding and agreed to proceed.  Hayley [name] [id number] 78 y.o.  07/01/2024 9:24 AM  History of Present Illness:  Patient is evaluated by video session.  She is at the passenger seat as daughter is taking her to the appointments.  She reported things are much better and she is sleeping better with the trazodone .  However last night she did not took the trazodone  as she ran out and did not sleep well.  She also had DBS treatment and that helped a lot her left side of tremors in her hand and in March she is going to have other side.  Overall she feel depression is not as bad.  She is able to drive but usually her daughter takes her to the doctor's appointment.  She denies any crying spells or any feeling of hopelessness or worthlessness.  She denies any paranoia, agitation, severe panic attack.  She admitted very busy with other doctors appointment.  She liked the combination of trazodone  and Trintellix .  She has taken few times Ativan  before the DBS.  She completed and now schedule in March to help the tremors on the right hand.  She denies drinking or using any illegal substances.  Her appetite is okay.  Her weight is stable.  Past Psychiatric History: H/O depression and anxiety.  H/O suicidal attempt on Percocet in March 2019.  Took Effexor in her 85s but stopped after working for a while.  Tried Zoloft , Paxil and Wellbutrin  from PCP but did not work. Did IOP, PHP and given Prozac  but no improvement. Tried lexapro  caused GI s/e,  Lamictal  caused increase tremors.    Past Medical History:  Diagnosis Date   Anemia    Anxiety    Bronchitis    hx of    Clotting disorder 02-17-14   S/p appendectomy-developed Pulmonary emboli-tx. warfarin-d/c 1 yr ago.   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-low tone   Hyperthyroidism    Kidney stones    Migraines    Nausea Nov 15, 2014   cycle of migraine headaches and nausea   Neuromuscular disorder (HCC)    hands and throatspasmodic dysphonia, tremors - thimb and forefinger    Obesity, Class III, BMI 40-49.9 (morbid obesity) 04/27/2010   02-17-14 reports some weight loss- intentional   Osteoarthritis    in back(chronic pain)   Pelvic pain    02-17-14 states thinks its back related on left groin   Pulmonary emboli (HCC)    s/p Appendectomy '13 -[facility]   Reflux    Right knee pain    Sleep apnea    cpap - settings at 4    Toxic goiter     Outpatient Encounter Medications as of 07/01/2024  Medication Sig   albuterol  (PROVENTIL ) (2.5 MG/3ML) 0.083% nebulizer solution Take 2.5 mg by nebulization every 4 (four) hours as needed for wheezing or shortness of breath.   apixaban  (  ELIQUIS ) 2.5 MG TABS tablet Take 1 tablet (2.5 mg total) by mouth 2 (two) times daily.   clindamycin (CLEOCIN) 300 MG capsule Take 600 mg by mouth once.   diclofenac  (FLECTOR ) 1.3 % PTCH Place 1 patch onto the skin daily. (Patient taking differently: Place 1 patch onto the skin daily as needed (Joint and muscle pain).)   fluticasone  (FLONASE ) 50 MCG/ACT nasal spray Place 1 spray into both nostrils daily.   HYDROcodone -acetaminophen  (NORCO) 10-325 MG tablet Take 1 tablet by mouth every 6 (six) hours.   hydrOXYzine  (ATARAX ) 25 MG tablet Take 1 tablet (25 mg total) by mouth as needed for anxiety, itching or nausea. 1 tablet every day prn   hyoscyamine (ANASPAZ) 0.125 MG  TBDP disintergrating tablet Place 0.125 mg under the tongue every 4 (four) hours as needed for cramping (Disgestion).   ipratropium (ATROVENT ) 0.03 % nasal spray Place 2 sprays into both nostrils every 12 (twelve) hours.   Ipratropium-Albuterol  (COMBIVENT  RESPIMAT) 20-100 MCG/ACT AERS respimat Inhale 1 puff into the lungs every 6 (six) hours as needed for wheezing.   levothyroxine  (SYNTHROID , LEVOTHROID) 25 MCG tablet Take 25 mcg by mouth every morning.    LORazepam  (ATIVAN ) 0.5 MG tablet Take 1 tablet (0.5 mg total) by mouth daily as needed for anxiety.   mometasone -formoterol  (DULERA ) 100-5 MCG/ACT AERO Inhale 2 puffs into the lungs 2 (two) times daily as needed for wheezing or shortness of breath.   nystatin  (MYCOSTATIN ) 100000 UNIT/ML suspension Use as directed in the mouth or throat daily as needed (Thrush).   promethazine -dextromethorphan  (PROMETHAZINE -DM) 6.25-15 MG/5ML syrup TAKE 5 MILLILITERS BY MOUTH EVERY 6 HOURS AS NEEDED   propranolol  (INDERAL ) 10 MG tablet Take 1 tablet (10 mg total) by mouth 2 (two) times daily as needed. (Patient taking differently: Take 10 mg by mouth 2 (two) times daily as needed. 30 in before going out)   propranolol  ER (INDERAL  LA) 120 MG 24 hr capsule TAKE 1 CAPSULE BY MOUTH EVERY DAY   Rimegepant Sulfate (NURTEC) 75 MG TBDP TAKE 1 TAB AT ONSET OF MIGRAINE. MAY REPEAT IN 2 HRS, IF NEEDED. MAX DOSE: 2 TABS/DAY.   simethicone  (MYLICON) 80 MG chewable tablet Chew 1 tablet (80 mg total) by mouth 4 (four) times daily as needed for flatulence (belching).   tiZANidine (ZANAFLEX) 4 MG tablet Take 4 mg by mouth at bedtime as needed for muscle spasms.   traZODone  (DESYREL ) 150 MG tablet Take 1 tablet (150 mg total) by mouth at bedtime.   valACYclovir  (VALTREX ) 1000 MG tablet Take 2,000 mg by mouth 2 (two) times daily as needed (Fever blisters). Reported on 01/10/2016   vortioxetine  HBr (TRINTELLIX ) 20 MG TABS tablet Take 1 tablet (20 mg total) by mouth daily.    Facility-Administered Encounter Medications as of 07/01/2024  Medication   botulinum toxin Type A  (BOTOX ) injection 155 Units    No results found for this or any previous visit (from the past 2160 hours).   Psychiatric Specialty Exam: Physical Exam  Review of Systems  Weight 177 lb (80.3 kg), last menstrual period 09/18/1979.There is no height or weight on file to calculate BMI.  General Appearance: Casual  Eye Contact:  Fair  Speech:  Slow  Volume:  Decreased  Mood:  Euthymic  Affect:  Congruent  Thought Process:  Goal Directed  Orientation:  Full (Time, Place, and Person)  Thought Content:  Logical  Suicidal Thoughts:  No  Homicidal Thoughts:  No  Memory:  Immediate;   Good Recent;  Fair Remote;   Good  Judgement:  Intact  Insight:  Present  Psychomotor Activity:  Decreased  Concentration:  Concentration: Fair and Attention Span: Fair  Recall:  Good  Fund of Knowledge:  Good  Language:  Good  Akathisia:  No  Handed:  Right  AIMS (if indicated):     Assets:  Communication Skills Desire for Improvement Housing Resilience Social Support  ADL's:  Intact  Cognition:  WNL  Sleep:  ok       09/06/2022   11:41 AM 08/07/2019    1:08 PM 04/06/2019    1:27 PM  Depression screen PHQ 2/9  Decreased Interest 0 3 1  Down, Depressed, Hopeless 0 3 2  PHQ - 2 Score 0 6 3  Altered sleeping  3 1  Tired, decreased energy  3 1  Change in appetite  3 1  Feeling bad or failure about yourself   3 0  Trouble concentrating  3 1  Moving slowly or fidgety/restless  2 0  Suicidal thoughts  1 0  PHQ-9 Score  24 7  Difficult doing work/chores   Somewhat difficult    Assessment/Plan: MDD (major depressive episode), single episode, severe, no psychosis (HCC) - Plan: traZODone  (DESYREL ) 150 MG tablet, vortioxetine  HBr (TRINTELLIX ) 20 MG TABS tablet, hydrOXYzine  (ATARAX ) 25 MG tablet  Anxiety - Plan: vortioxetine  HBr (TRINTELLIX ) 20 MG TABS tablet, hydrOXYzine  (ATARAX ) 25 MG  tablet  Tremor - Plan: hydrOXYzine  (ATARAX ) 25 MG tablet  Patient is stable on trazodone  and Trintellix .  Sometimes she takes Ativan .  Since she had DBS her tremors on the left hand is almost gone she still have tremors on the right hand and going to have DBS in March for that side.  She does not want to change the medication.  She is getting Trintellix  from patient assistant program.  Continue trazodone  150 mg at bedtime.  Recommend to call back if has any question or any concern.  Continue Trintellix  20 mg daily and hydroxyzine  25 mg as needed for tremors.  Recommend to call back if she has any question or any concern.  Encouraged to have in person visit In the office.  Follow-up in 3 months   Follow Up Instructions:     I discussed the assessment and treatment plan with the patient. The patient was provided an opportunity to ask questions and all were answered. The patient agreed with the plan and demonstrated an understanding of the instructions.   The patient was advised to call back or seek an in-person evaluation if the symptoms worsen or if the condition fails to improve as anticipated.    Collaboration of Care: Other provider involved in patient's care AEB notes are available in epic to review  Patient/Guardian was advised Release of Information must be obtained prior to any record release in order to collaborate their care with an outside provider. Patient/Guardian was advised if they have not already done so to contact the registration department to sign all necessary forms in order for us  to release information regarding their care.   Consent: Patient/Guardian gives verbal consent for treatment and assignment of benefits for services provided during this visit. Patient/Guardian expressed understanding and agreed to proceed.     Total encounter time 18 minutes which includes face-to-face time, chart reviewed, care coordination, order entry and documentation during this encounter.    Note: This document was prepared by [employer] voice dictation technology and any errors that results from this process are unintentional.  Leni ONEIDA Client, MD 07/01/2024

## 2024-07-22 DIAGNOSIS — E538 Deficiency of other specified B group vitamins: Secondary | ICD-10-CM | POA: Diagnosis not present

## 2024-08-05 DIAGNOSIS — M7989 Other specified soft tissue disorders: Secondary | ICD-10-CM | POA: Diagnosis not present

## 2024-08-05 DIAGNOSIS — R062 Wheezing: Secondary | ICD-10-CM | POA: Diagnosis not present

## 2024-08-05 DIAGNOSIS — L539 Erythematous condition, unspecified: Secondary | ICD-10-CM | POA: Diagnosis not present

## 2024-08-05 DIAGNOSIS — R300 Dysuria: Secondary | ICD-10-CM | POA: Diagnosis not present

## 2024-08-05 DIAGNOSIS — J988 Other specified respiratory disorders: Secondary | ICD-10-CM | POA: Diagnosis not present

## 2024-08-17 ENCOUNTER — Other Ambulatory Visit: Payer: Self-pay | Admitting: Pulmonary Disease

## 2024-08-17 ENCOUNTER — Other Ambulatory Visit (HOSPITAL_COMMUNITY): Payer: Self-pay | Admitting: Psychiatry

## 2024-08-17 DIAGNOSIS — F419 Anxiety disorder, unspecified: Secondary | ICD-10-CM

## 2024-08-17 DIAGNOSIS — R053 Chronic cough: Secondary | ICD-10-CM

## 2024-08-18 ENCOUNTER — Telehealth: Payer: Self-pay

## 2024-08-18 NOTE — Telephone Encounter (Signed)
 Pharmacy Patient Advocate Encounter   Received notification from Fax that prior authorization for Nurtec is due for renewal.   Insurance verification completed.   The patient is insured through [facility].  Action: Patient hasn't been seen in your office in over a year. Plan requires updated chart notes for PA renewal.

## 2024-10-01 ENCOUNTER — Ambulatory Visit (HOSPITAL_COMMUNITY): Admitting: Psychiatry

## 2024-10-15 ENCOUNTER — Encounter (HOSPITAL_COMMUNITY): Payer: Self-pay | Admitting: Psychiatry

## 2024-10-15 ENCOUNTER — Ambulatory Visit (HOSPITAL_COMMUNITY): Admitting: Psychiatry

## 2024-10-15 VITALS — BP 110/69 | HR 68 | Resp 18 | Ht 66.0 in | Wt 185.8 lb

## 2024-10-15 DIAGNOSIS — R251 Tremor, unspecified: Secondary | ICD-10-CM | POA: Diagnosis not present

## 2024-10-15 DIAGNOSIS — F322 Major depressive disorder, single episode, severe without psychotic features: Secondary | ICD-10-CM | POA: Diagnosis not present

## 2024-10-15 DIAGNOSIS — F419 Anxiety disorder, unspecified: Secondary | ICD-10-CM | POA: Diagnosis not present

## 2024-10-15 MED ORDER — LORAZEPAM 0.5 MG PO TABS: 0.5000 mg | ORAL_TABLET | Freq: Every day | ORAL | 0 refills | Status: AC | PRN

## 2024-10-15 MED ORDER — TRAZODONE HCL 150 MG PO TABS: 150.0000 mg | ORAL_TABLET | Freq: Every day | ORAL | 0 refills | Status: AC

## 2024-10-15 MED ORDER — VORTIOXETINE HBR 20 MG PO TABS: 20.0000 mg | ORAL_TABLET | Freq: Every day | ORAL | 0 refills | Status: AC

## 2024-10-15 MED ORDER — HYDROXYZINE HCL 25 MG PO TABS: 25.0000 mg | ORAL_TABLET | ORAL | 1 refills | Status: AC | PRN

## 2024-10-15 NOTE — Progress Notes (Signed)
 [facility] MD/PA/NP OP Progress Note  10/15/2024 11:55 AM Hayley [name]  MRN:  [id number]  Chief Complaint:  Chief Complaint  Patient presents with   Follow-up   Anxiety   HPI: Patient came today to the office for her appointment.  She reported a lot of sadness and dysphoria because she is under financial strain.  Her house is going to be foreclosure.  She has a hearing in first week of March and if no help then oxygen  will be on March 26.  She is trying to get resources to save the house.  She admitted crying and does not want to quit but also not have a lot of means to save the house.  She is sad about her daughter who is detached from her and she does not want to ask help from her.  She also does not want to get help from her friends.  She also reported son who lives in [place]  is getting divorce and she is sad about the situation.  She admitted taking Ativan  frequently because of panic attacks.  Some nights she does not sleep very well.  Her tremors are overall better since she had DBS.  She is taking Trintellix  and that is helping her depression but she needs something to help her anxiety.  She is prescribed hydroxyzine  but she does not take on a regular basis.  She reported ruminative thoughts and now thinking about her deceased husband a lot.  She reported if not able to get some help then thinking to sell her jewelry or may have to move to [place]  to live close to her son.  However she has admit plan yet.  She denies any active or passive suicidal thoughts.  She has hopelessness but denies any mania, psychosis, anger, worthlessness.  She is taking trazodone .  She denies drinking or using any illegal substance.  Her appetite is okay.  She gained a few pounds because not as active.  She reported holidays were quite.  Visit Diagnosis:    ICD-10-CM   1. MDD (major depressive episode), single episode, severe, no psychosis (HCC)  F32.2 hydrOXYzine  (ATARAX ) 25 MG tablet    traZODone  (DESYREL ) 150 MG  tablet    vortioxetine  HBr (TRINTELLIX ) 20 MG TABS tablet    2. Tremor  R25.1 hydrOXYzine  (ATARAX ) 25 MG tablet    3. Anxiety  F41.9       Past Psychiatric History: Reviewed H/O depression and anxiety.  H/O suicidal attempt on Percocet in March 2019.  Took Effexor in her 21s but stopped after working for a while.  Tried Zoloft , Paxil and Wellbutrin  from PCP but did not work. Did IOP, PHP and given Prozac  but no improvement. Tried lexapro  caused GI s/e, Lamictal  caused increase tremors.    Past Medical History:  Past Medical History:  Diagnosis Date   Anemia    Anxiety    Bronchitis    hx of    Clotting disorder 02-17-14   S/p appendectomy-developed Pulmonary emboli-tx. warfarin-d/c 1 yr ago.   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-low tone   Hyperthyroidism    Kidney stones    Migraines    Nausea Nov 15, 2014   cycle of migraine headaches and nausea   Neuromuscular disorder (HCC)    hands and throatspasmodic dysphonia, tremors - thimb and forefinger  Obesity, Class III, BMI 40-49.9 (morbid obesity) (HCC) 04/27/2010   02-17-14 reports some weight loss- intentional   Osteoarthritis    in back(chronic pain)   Pelvic pain    02-17-14 states thinks its back related on left groin   Pulmonary emboli (HCC)    s/p Appendectomy '13 [facility]   Reflux    Right knee pain    Sleep apnea    cpap - settings at 4    Toxic goiter     Past Surgical History:  Procedure Laterality Date   ABDOMINAL HYSTERECTOMY     APPENDECTOMY     3'13 [facility] s/p developed  Pulmonary Emboli   AUGMENTATION MAMMAPLASTY  09/17/1968   BOTOX  INJECTION     for migranes   CATARACT EXTRACTION, BILATERAL Bilateral 03/2013    CHOLECYSTECTOMY     COLONOSCOPY WITH PROPOFOL  N/A 12/15/2021   Procedure: COLONOSCOPY WITH PROPOFOL ;  Surgeon: Rollin Dover, MD;  Location: [facility]  ENDOSCOPY;  Service: Gastroenterology;  Laterality: N/A;   HAND SURGERY  2004   both hands   HEMOSTASIS CLIP PLACEMENT  12/15/2021   Procedure: HEMOSTASIS CLIP PLACEMENT;  Surgeon: Rollin Dover, MD;  Location: [facility] ENDOSCOPY;  Service: Gastroenterology;;   KIDNEY STONE SURGERY     KNEE ARTHROSCOPY  1998   right   LAPAROSCOPIC APPENDECTOMY  11/27/2011   Procedure: APPENDECTOMY LAPAROSCOPIC;  Surgeon: Elspeth KYM Schultze, MD;  Location: [facility] ORS;  Service: General;  Laterality: N/A;   POLYPECTOMY  12/15/2021   Procedure: POLYPECTOMY;  Surgeon: Rollin Dover, MD;  Location: [facility] ENDOSCOPY;  Service: Gastroenterology;;   SPINAL CORD STIMULATOR IMPLANT  06/04/2016   Dr. FABIENE Provost at [facility]   TOTAL KNEE ARTHROPLASTY Right 12/06/2014   Procedure: RIGHT TOTAL KNEE ARTHROPLASTY;  Surgeon: Dempsey Moan, MD;  Location: [facility] ORS;  Service: Orthopedics;  Laterality: Right;   TOTAL KNEE REVISION Left 02/24/2014   Procedure: LEFT TOTAL KNEE REVISION;  Surgeon: Dempsey Moan GAILS, MD;  Location: [facility] ORS;  Service: Orthopedics;  Laterality: Left;   vocal cord surgery  02-17-14   01-12-14 done in [place]  -[facility](s/p selective denervation/reinnervation recurrent laryngeal nerve surgery)   zenkers diverticulum      Family Psychiatric History: Reviewed  Family History:  Family History  Problem Relation Age of Onset   Heart disease Mother    Pneumonia Father    Cirrhosis Father    Hypertension Father    Alcoholism Father    Heart attack Paternal Grandfather    Heart attack Maternal Grandfather    Heart attack Paternal Grandmother    Heart attack Maternal Grandmother    Breast cancer Daughter     Social History:  Social History   Socioeconomic History   Marital status: Widowed    Spouse name: Not on file   Number of children: 5   Years of education: 17   Highest education level: Not on file  Occupational History   Occupation: Retired  Tobacco Use   Smoking status: Former    Current packs/day:  0.00    Average packs/day: 0.3 packs/day for 5.0 years (1.3 ttl pk-yrs)    Types: Cigarettes    Start date: 01/03/1977    Quit date: 01/03/1982    Years since quitting: 42.8   Smokeless tobacco: Never  Vaping Use   Vaping status: Never Used  Substance and Sexual Activity   Alcohol use: No   Drug use: No   Sexual activity: Not Currently  Other Topics Concern   Not on file  Social  History Narrative   Lives at home alone at this time.   Right-handed.   2 cups caffeine  per day.   Social Drivers of Health   Tobacco Use: Medium Risk (07/01/2024)   Patient History    Smoking Tobacco Use: Former    Smokeless Tobacco Use: Never    Passive Exposure: Not on file  Financial Resource Strain: Not on file  Food Insecurity: Food Insecurity Present (09/07/2022)   Hunger Vital Sign    Worried About Running Out of Food in the Last Year: Sometimes true    Ran Out of Food in the Last Year: Sometimes true  Transportation Needs: No Transportation Needs (05/08/2024)   Received from [facility]    In the past 12 months, has lack of reliable transportation kept you from medical appointments, meetings, work or from getting things needed for daily living? : No  Physical Activity: Not on file  Stress: Not on file  Social Connections: Unknown (01/28/2022)   Received from [facility]   Social Network    Social Network: Not on file  Depression (PHQ2-9): Low Risk (09/06/2022)   Depression (PHQ2-9)    PHQ-2 Score: 0  Alcohol Screen: Not on file  Housing: Low Risk (05/08/2024)   Received from [facility]   Epic    What is your living situation today?: I have a steady place to live    Think about the place you live. Do you have problems with any of the following? Choose all that apply:: None/None on this list  Utilities: Low Risk (05/08/2024)   Received from [facility]   Utilities    In the past 12 months has the electric, gas, oil, or water company threatened to shut off  services in your home? : No  Health Literacy: Not on file    Allergies: Allergies[1]  Metabolic Disorder Labs: Lab Results  Component Value Date   HGBA1C 6.4 (H) 04/02/2019   MPG 136.98 04/02/2019   MPG 157 03/23/2016   No results found for: PROLACTIN Lab Results  Component Value Date   CHOL 159 03/23/2016   TRIG 122 11/17/2017   HDL 31 (L) 03/23/2016   CHOLHDL 5.1 03/23/2016   VLDL 35 03/23/2016   LDLCALC 93 03/23/2016   Lab Results  Component Value Date   TSH 1.348 11/17/2017   TSH 1.140 12/19/2016    Therapeutic Level Labs: No results found for: LITHIUM No results found for: VALPROATE No results found for: CBMZ  Current Medications: Current Outpatient Medications  Medication Sig Dispense Refill   albuterol  (PROVENTIL ) (2.5 MG/3ML) 0.083% nebulizer solution Take 2.5 mg by nebulization every 4 (four) hours as needed for wheezing or shortness of breath.     apixaban  (ELIQUIS ) 2.5 MG TABS tablet Take 1 tablet (2.5 mg total) by mouth 2 (two) times daily. 180 tablet 4   clindamycin (CLEOCIN) 300 MG capsule Take 600 mg by mouth once.     diclofenac  (FLECTOR ) 1.3 % PTCH Place 1 patch onto the skin daily. (Patient taking differently: Place 1 patch onto the skin daily as needed (Joint and muscle pain).) 20 patch 1   fluticasone  (FLONASE ) 50 MCG/ACT nasal spray Place 1 spray into both nostrils daily. 16 g 2   HYDROcodone -acetaminophen  (NORCO) 10-325 MG tablet Take 1 tablet by mouth every 6 (six) hours.     hydrOXYzine  (ATARAX ) 25 MG tablet Take 1 tablet (25 mg total) by mouth as needed for anxiety, itching or nausea. 20 tablet 0   hyoscyamine (  ANASPAZ) 0.125 MG TBDP disintergrating tablet Place 0.125 mg under the tongue every 4 (four) hours as needed for cramping (Disgestion).     ipratropium (ATROVENT ) 0.03 % nasal spray Place 2 sprays into both nostrils every 12 (twelve) hours. 30 mL 12   Ipratropium-Albuterol  (COMBIVENT  RESPIMAT) 20-100 MCG/ACT AERS respimat Inhale 1  puff into the lungs every 6 (six) hours as needed for wheezing. 4 g 11   levothyroxine  (SYNTHROID , LEVOTHROID) 25 MCG tablet Take 25 mcg by mouth every morning.      LORazepam  (ATIVAN ) 0.5 MG tablet TAKE 1 TABLET BY MOUTH EVERY DAY AS NEEDED FOR ANXIETY 10 tablet 0   mometasone -formoterol  (DULERA ) 100-5 MCG/ACT AERO Inhale 2 puffs into the lungs 2 (two) times daily as needed for wheezing or shortness of breath. 1 each 11   nystatin  (MYCOSTATIN ) 100000 UNIT/ML suspension Use as directed in the mouth or throat daily as needed (Thrush).     promethazine -dextromethorphan  (PROMETHAZINE -DM) 6.25-15 MG/5ML syrup TAKE 5 MILLILITERS BY MOUTH EVERY 6 HOURS AS NEEDED 118 mL 1   propranolol  (INDERAL ) 10 MG tablet Take 1 tablet (10 mg total) by mouth 2 (two) times daily as needed. (Patient taking differently: Take 10 mg by mouth 2 (two) times daily as needed. 30 in before going out) 60 tablet 11   propranolol  ER (INDERAL  LA) 120 MG 24 hr capsule TAKE 1 CAPSULE BY MOUTH EVERY DAY 90 capsule 3   Rimegepant Sulfate (NURTEC) 75 MG TBDP TAKE 1 TAB AT ONSET OF MIGRAINE. MAY REPEAT IN 2 HRS, IF NEEDED. MAX DOSE: 2 TABS/DAY. 8 tablet 11   simethicone  (MYLICON) 80 MG chewable tablet Chew 1 tablet (80 mg total) by mouth 4 (four) times daily as needed for flatulence (belching). 30 tablet 0   tiZANidine (ZANAFLEX) 4 MG tablet Take 4 mg by mouth at bedtime as needed for muscle spasms.     traZODone  (DESYREL ) 150 MG tablet Take 1 tablet (150 mg total) by mouth at bedtime. 90 tablet 0   valACYclovir  (VALTREX ) 1000 MG tablet Take 2,000 mg by mouth 2 (two) times daily as needed (Fever blisters). Reported on 01/10/2016  2   vortioxetine  HBr (TRINTELLIX ) 20 MG TABS tablet Take 1 tablet (20 mg total) by mouth daily. 90 tablet 0   Current Facility-Administered Medications  Medication Dose Route Frequency Provider Last Rate Last Admin   botulinum toxin Type A  (BOTOX ) injection 155 Units  155 Units Intramuscular Q90 days Gayland Lauraine PARAS, NP   155 Units at 12/25/22 9045     Musculoskeletal: Strength & Muscle Tone: within normal limits Gait & Station: normal Patient leans: N/A  Psychiatric Specialty Exam: Review of Systems  Blood pressure 110/69, pulse 68, resp. rate 18, height 5' 6 (1.676 m), weight 185 lb 12.8 oz (84.3 kg), last menstrual period 09/18/1979.There is no height or weight on file to calculate BMI.  General Appearance: Casual  Eye Contact:  Fair  Speech:  Slow  Volume:  Decreased  Mood:  Anxious and Dysphoric  Affect:  Congruent  Thought Process:  Goal Directed  Orientation:  Full (Time, Place, and Person)  Thought Content: Rumination   Suicidal Thoughts:  No  Homicidal Thoughts:  No  Memory:  Immediate;   Good Recent;   Good Remote;   Good  Judgement:  Intact  Insight:  Present  Psychomotor Activity:  Decreased and Tremor  Concentration:  Concentration: Good and Attention Span: Fair  Recall:  Fair  Fund of Knowledge: Good  Language: Good  Akathisia:  No  Handed:  Right  AIMS (if indicated): not done  Assets:  Communication Skills Desire for Improvement Transportation  ADL's:  Intact  Cognition: WNL  Sleep:  Fair   Screenings: PHQ2-9    Flowsheet Row Telephone from 09/06/2022 in [facility] Care Coordination Office Visit from 08/06/2019 in [facility] Patient Outreach Telephone from 04/06/2019 in [facility]  PHQ-2 Total Score 0 6 3  PHQ-9 Total Score -- 24 7   Flowsheet Row ED from 10/10/2022 in [facility] Emergency Department at [facility] Admission (Discharged) from 12/15/2021 in [facility] ENDOSCOPY  C-SSRS RISK CATEGORY No Risk No Risk     Assessment and Plan: Patient is 79 year old widowed Caucasian female with major depressive disorder, anxiety, tremors and panic attacks.  Discussed psychosocial stressors as facing foreclosure and now trying to use resources including  nonprofit organization to get some help.  She does not want to get help from her daughter or her friends at this time.  Recommend to take the hydroxyzine  every night to help her sleep.  Continue Trintellix  20 mg daily.  We will provide sample if available.  Discussed to take Ativan  for panic attack and we will provide 15 tablets.  Discussed benzodiazepine dependence tolerance withdrawal.  I offered therapy but patient at this time not interested because cannot afford.  So far tolerating medication and reported no side effects from the medication.  She has a court hearing on March 5th and she is hoping to do arrange some forms to avoid foreclosure.  Encouraged to call back if she has any question concern or if she feels worsening of symptoms.  Discussed safety concerns and anytime having active suicidal thoughts or homicidal thoughts then she need to call 911 or go to local emergency room.  Will follow-up in 6 to 8 weeks.  Collaboration of Care: Collaboration of Care: Other provider involved in patient's care AEB notes are available in epic to review  Patient/Guardian was advised Release of Information must be obtained prior to any record release in order to collaborate their care with an outside provider. Patient/Guardian was advised if they have not already done so to contact the registration department to sign all necessary forms in order for us  to release information regarding their care.   Consent: Patient/Guardian gives verbal consent for treatment and assignment of benefits for services provided during this visit. Patient/Guardian expressed understanding and agreed to proceed.    Leni ONEIDA Client, MD 10/15/2024, 11:55 AM     [1]  Allergies Allergen Reactions   Bee Venom Anaphylaxis   Morphine  Other (See Comments)    Hallucinations   Morphine  And Codeine     Tape Rash    Rash - only use paper tape   Etodolac Diarrhea and Nausea And Vomiting   Other Itching and Swelling    Bee venom    Penicillins Rash    Has patient had a PCN reaction causing immediate rash, facial/tongue/throat swelling, SOB or lightheadedness with hypotension: Yes Has patient had a PCN reaction causing severe rash involving mucus membranes or skin necrosis: No Has patient had a PCN reaction that required hospitalization No Has patient had a PCN reaction occurring within the last 10 years: No If all of the above answers are NO, then may proceed with Cephalosporin use.    Poison Ivy Extract  [Poison Ivy Extract] Itching and Swelling   Propoxyphene Nausea And Vomiting   Propoxyphene N-Acetaminophen  Nausea And Vomiting

## 2024-12-10 ENCOUNTER — Ambulatory Visit (HOSPITAL_COMMUNITY): Admitting: Psychiatry
